# Patient Record
Sex: Female | Born: 1950
Health system: Southern US, Community
[De-identification: ages and names within clinical notes are randomized; demographics above are authoritative.]

## PROBLEM LIST (undated history)

## (undated) DIAGNOSIS — F32A Depression, unspecified: Secondary | ICD-10-CM

## (undated) DIAGNOSIS — E785 Hyperlipidemia, unspecified: Secondary | ICD-10-CM

## (undated) DIAGNOSIS — I428 Other cardiomyopathies: Secondary | ICD-10-CM

## (undated) DIAGNOSIS — I493 Ventricular premature depolarization: Secondary | ICD-10-CM

## (undated) DIAGNOSIS — K604 Rectal fistula, unspecified: Secondary | ICD-10-CM

## (undated) DIAGNOSIS — K219 Gastro-esophageal reflux disease without esophagitis: Secondary | ICD-10-CM

## (undated) DIAGNOSIS — Z972 Presence of dental prosthetic device (complete) (partial): Secondary | ICD-10-CM

## (undated) DIAGNOSIS — I38 Endocarditis, valve unspecified: Secondary | ICD-10-CM

## (undated) DIAGNOSIS — M1990 Unspecified osteoarthritis, unspecified site: Secondary | ICD-10-CM

## (undated) DIAGNOSIS — F410 Panic disorder [episodic paroxysmal anxiety] without agoraphobia: Secondary | ICD-10-CM

## (undated) DIAGNOSIS — F4310 Post-traumatic stress disorder, unspecified: Secondary | ICD-10-CM

## (undated) DIAGNOSIS — F329 Major depressive disorder, single episode, unspecified: Secondary | ICD-10-CM

## (undated) DIAGNOSIS — F419 Anxiety disorder, unspecified: Secondary | ICD-10-CM

## (undated) DIAGNOSIS — I2510 Atherosclerotic heart disease of native coronary artery without angina pectoris: Secondary | ICD-10-CM

## (undated) DIAGNOSIS — J449 Chronic obstructive pulmonary disease, unspecified: Secondary | ICD-10-CM

## (undated) DIAGNOSIS — G4730 Sleep apnea, unspecified: Secondary | ICD-10-CM

## (undated) DIAGNOSIS — E119 Type 2 diabetes mellitus without complications: Secondary | ICD-10-CM

## (undated) DIAGNOSIS — M810 Age-related osteoporosis without current pathological fracture: Secondary | ICD-10-CM

## (undated) HISTORY — DX: Post-traumatic stress disorder, unspecified: F43.10

## (undated) HISTORY — DX: Major depressive disorder, single episode, unspecified: F32.9

## (undated) HISTORY — DX: Type 2 diabetes mellitus without complications: E11.9

## (undated) HISTORY — PX: TOTAL ABDOMINAL HYSTERECTOMY W/ BILATERAL SALPINGOOPHORECTOMY: SHX83

## (undated) HISTORY — DX: Unspecified osteoarthritis, unspecified site: M19.90

## (undated) HISTORY — DX: Depression, unspecified: F32.A

## (undated) HISTORY — DX: Gastro-esophageal reflux disease without esophagitis: K21.9

## (undated) HISTORY — DX: Atherosclerotic heart disease of native coronary artery without angina pectoris: I25.10

## (undated) HISTORY — DX: Ventricular premature depolarization: I49.3

## (undated) HISTORY — DX: Hyperlipidemia, unspecified: E78.5

## (undated) HISTORY — PX: TUBAL LIGATION: SHX77

## (undated) HISTORY — DX: Anxiety disorder, unspecified: F41.9

## (undated) HISTORY — DX: Age-related osteoporosis without current pathological fracture: M81.0

## (undated) HISTORY — PX: ABDOMINAL HYSTERECTOMY: SHX81

## (undated) HISTORY — DX: Morbid (severe) obesity due to excess calories: E66.01

## (undated) HISTORY — DX: Other cardiomyopathies: I42.8

## (undated) HISTORY — DX: Chronic obstructive pulmonary disease, unspecified: J44.9

## (undated) HISTORY — PX: CARDIAC CATHETERIZATION: SHX172

## (undated) HISTORY — DX: Panic disorder (episodic paroxysmal anxiety): F41.0

---

## 2003-04-26 ENCOUNTER — Emergency Department (HOSPITAL_COMMUNITY): Admission: EM | Admit: 2003-04-26 | Discharge: 2003-04-26 | Payer: Self-pay | Admitting: Emergency Medicine

## 2004-12-04 ENCOUNTER — Emergency Department: Payer: Self-pay | Admitting: Unknown Physician Specialty

## 2005-11-16 ENCOUNTER — Ambulatory Visit: Payer: Self-pay | Admitting: Cardiovascular Disease

## 2005-11-22 ENCOUNTER — Ambulatory Visit: Payer: Self-pay | Admitting: Cardiovascular Disease

## 2005-12-20 ENCOUNTER — Ambulatory Visit: Payer: Self-pay | Admitting: Gastroenterology

## 2007-01-16 ENCOUNTER — Ambulatory Visit: Payer: Self-pay

## 2007-06-17 ENCOUNTER — Ambulatory Visit: Payer: Self-pay | Admitting: Gastroenterology

## 2007-07-24 ENCOUNTER — Ambulatory Visit: Payer: Self-pay | Admitting: Podiatry

## 2007-08-01 ENCOUNTER — Emergency Department: Payer: Self-pay | Admitting: Unknown Physician Specialty

## 2009-09-13 DIAGNOSIS — Z87891 Personal history of nicotine dependence: Secondary | ICD-10-CM | POA: Insufficient documentation

## 2011-12-19 ENCOUNTER — Emergency Department: Payer: Self-pay | Admitting: Emergency Medicine

## 2012-10-22 HISTORY — PX: COLONOSCOPY: SHX174

## 2013-01-28 ENCOUNTER — Emergency Department: Payer: Self-pay | Admitting: Emergency Medicine

## 2013-01-28 LAB — COMPREHENSIVE METABOLIC PANEL
Albumin: 3.4 g/dL (ref 3.4–5.0)
Alkaline Phosphatase: 112 U/L (ref 50–136)
Anion Gap: 15 (ref 7–16)
BUN: 21 mg/dL — ABNORMAL HIGH (ref 7–18)
Bilirubin,Total: 0.6 mg/dL (ref 0.2–1.0)
Calcium, Total: 8.3 mg/dL — ABNORMAL LOW (ref 8.5–10.1)
Chloride: 105 mmol/L (ref 98–107)
Co2: 17 mmol/L — ABNORMAL LOW (ref 21–32)
Creatinine: 1.66 mg/dL — ABNORMAL HIGH (ref 0.60–1.30)
EGFR (African American): 39 — ABNORMAL LOW
EGFR (Non-African Amer.): 33 — ABNORMAL LOW
Glucose: 206 mg/dL — ABNORMAL HIGH (ref 65–99)
Osmolality: 283 (ref 275–301)
Potassium: 4 mmol/L (ref 3.5–5.1)
SGOT(AST): 49 U/L — ABNORMAL HIGH (ref 15–37)
SGPT (ALT): 75 U/L (ref 12–78)
Sodium: 137 mmol/L (ref 136–145)
Total Protein: 7.2 g/dL (ref 6.4–8.2)

## 2013-01-28 LAB — URINALYSIS, COMPLETE
Bacteria: NONE SEEN
Bilirubin,UR: NEGATIVE
Glucose,UR: 50 mg/dL (ref 0–75)
Hyaline Cast: 12
Nitrite: NEGATIVE
Ph: 5 (ref 4.5–8.0)
Protein: 30
RBC,UR: 18 /HPF (ref 0–5)
Specific Gravity: 1.018 (ref 1.003–1.030)
Squamous Epithelial: 4
WBC UR: 13 /HPF (ref 0–5)

## 2013-01-28 LAB — CBC
HCT: 42.5 % (ref 35.0–47.0)
HGB: 14.1 g/dL (ref 12.0–16.0)
MCH: 33.3 pg (ref 26.0–34.0)
MCHC: 33.2 g/dL (ref 32.0–36.0)
MCV: 100 fL (ref 80–100)
Platelet: 278 10*3/uL (ref 150–440)
RBC: 4.24 10*6/uL (ref 3.80–5.20)
RDW: 13.3 % (ref 11.5–14.5)
WBC: 8.8 10*3/uL (ref 3.6–11.0)

## 2013-01-28 LAB — ETHANOL
Ethanol %: <0.003 % (ref 0.000–0.080)
Ethanol: <3 mg/dL

## 2013-01-28 LAB — ACETAMINOPHEN LEVEL: Acetaminophen: 5 ug/mL — ABNORMAL LOW

## 2013-01-28 LAB — DRUG SCREEN, URINE
Amphetamines, Ur Screen: NEGATIVE (ref ?–1000)
Barbiturates, Ur Screen: NEGATIVE (ref ?–200)
Benzodiazepine, Ur Scrn: NEGATIVE (ref ?–200)
Cannabinoid 50 Ng, Ur ~~LOC~~: NEGATIVE (ref ?–50)
Cocaine Metabolite,Ur ~~LOC~~: NEGATIVE (ref ?–300)
MDMA (Ecstasy)Ur Screen: NEGATIVE (ref ?–500)
Methadone, Ur Screen: NEGATIVE (ref ?–300)
Opiate, Ur Screen: POSITIVE (ref ?–300)
Phencyclidine (PCP) Ur S: NEGATIVE (ref ?–25)
Tricyclic, Ur Screen: NEGATIVE (ref ?–1000)

## 2013-01-28 LAB — TSH: Thyroid Stimulating Horm: 2.57 u[IU]/mL

## 2013-01-28 LAB — SALICYLATE LEVEL: Salicylates, Serum: 3.3 mg/dL — ABNORMAL HIGH

## 2013-02-24 ENCOUNTER — Observation Stay: Payer: Self-pay | Admitting: Internal Medicine

## 2013-02-24 LAB — COMPREHENSIVE METABOLIC PANEL
Albumin: 3.6 g/dL (ref 3.4–5.0)
Alkaline Phosphatase: 113 U/L (ref 50–136)
Anion Gap: 6 — ABNORMAL LOW (ref 7–16)
BUN: 11 mg/dL (ref 7–18)
Bilirubin,Total: 0.4 mg/dL (ref 0.2–1.0)
Calcium, Total: 8.6 mg/dL (ref 8.5–10.1)
Chloride: 111 mmol/L — ABNORMAL HIGH (ref 98–107)
Co2: 26 mmol/L (ref 21–32)
Creatinine: 0.69 mg/dL (ref 0.60–1.30)
EGFR (African American): >60
EGFR (Non-African Amer.): >60
Glucose: 87 mg/dL (ref 65–99)
Osmolality: 284 (ref 275–301)
Potassium: 4.1 mmol/L (ref 3.5–5.1)
SGOT(AST): 58 U/L — ABNORMAL HIGH (ref 15–37)
SGPT (ALT): 94 U/L — ABNORMAL HIGH (ref 12–78)
Sodium: 143 mmol/L (ref 136–145)
Total Protein: 7.2 g/dL (ref 6.4–8.2)

## 2013-02-24 LAB — CBC WITH DIFFERENTIAL/PLATELET
Basophil #: 0 10*3/uL (ref 0.0–0.1)
Basophil %: 0.6 %
Eosinophil #: 0.3 10*3/uL (ref 0.0–0.7)
Eosinophil %: 3.5 %
HCT: 41 % (ref 35.0–47.0)
HGB: 14 g/dL (ref 12.0–16.0)
Lymphocyte #: 3.1 10*3/uL (ref 1.0–3.6)
Lymphocyte %: 38.2 %
MCH: 32.3 pg (ref 26.0–34.0)
MCHC: 34.1 g/dL (ref 32.0–36.0)
MCV: 95 fL (ref 80–100)
Monocyte #: 0.6 x10 3/mm (ref 0.2–0.9)
Monocyte %: 7.7 %
Neutrophil #: 4 10*3/uL (ref 1.4–6.5)
Neutrophil %: 50 %
Platelet: 244 10*3/uL (ref 150–440)
RBC: 4.32 10*6/uL (ref 3.80–5.20)
RDW: 13 % (ref 11.5–14.5)
WBC: 8 10*3/uL (ref 3.6–11.0)

## 2013-02-24 LAB — TROPONIN I
Troponin-I: <0.02 ng/mL
Troponin-I: <0.02 ng/mL

## 2013-02-24 LAB — PRO B NATRIURETIC PEPTIDE: B-Type Natriuretic Peptide: 68 pg/mL (ref 0–125)

## 2013-02-24 LAB — TSH: Thyroid Stimulating Horm: 0.749 u[IU]/mL

## 2013-02-25 LAB — TROPONIN I: Troponin-I: <0.02 ng/mL

## 2014-05-21 ENCOUNTER — Ambulatory Visit: Payer: Self-pay | Admitting: Family Medicine

## 2014-06-18 ENCOUNTER — Ambulatory Visit: Payer: Self-pay | Admitting: Gastroenterology

## 2014-11-25 DIAGNOSIS — F419 Anxiety disorder, unspecified: Secondary | ICD-10-CM | POA: Diagnosis not present

## 2014-11-25 DIAGNOSIS — F339 Major depressive disorder, recurrent, unspecified: Secondary | ICD-10-CM | POA: Diagnosis not present

## 2014-11-25 DIAGNOSIS — E784 Other hyperlipidemia: Secondary | ICD-10-CM | POA: Diagnosis not present

## 2014-12-20 DIAGNOSIS — E785 Hyperlipidemia, unspecified: Secondary | ICD-10-CM | POA: Diagnosis not present

## 2014-12-20 DIAGNOSIS — K219 Gastro-esophageal reflux disease without esophagitis: Secondary | ICD-10-CM | POA: Diagnosis not present

## 2014-12-20 DIAGNOSIS — I10 Essential (primary) hypertension: Secondary | ICD-10-CM | POA: Diagnosis not present

## 2014-12-20 DIAGNOSIS — R079 Chest pain, unspecified: Secondary | ICD-10-CM | POA: Diagnosis not present

## 2014-12-23 DIAGNOSIS — R079 Chest pain, unspecified: Secondary | ICD-10-CM | POA: Diagnosis not present

## 2014-12-24 DIAGNOSIS — R079 Chest pain, unspecified: Secondary | ICD-10-CM | POA: Diagnosis not present

## 2014-12-24 DIAGNOSIS — F339 Major depressive disorder, recurrent, unspecified: Secondary | ICD-10-CM | POA: Diagnosis not present

## 2014-12-24 DIAGNOSIS — K219 Gastro-esophageal reflux disease without esophagitis: Secondary | ICD-10-CM | POA: Diagnosis not present

## 2014-12-24 DIAGNOSIS — E784 Other hyperlipidemia: Secondary | ICD-10-CM | POA: Diagnosis not present

## 2014-12-24 DIAGNOSIS — F419 Anxiety disorder, unspecified: Secondary | ICD-10-CM | POA: Diagnosis not present

## 2014-12-24 DIAGNOSIS — G4733 Obstructive sleep apnea (adult) (pediatric): Secondary | ICD-10-CM | POA: Diagnosis not present

## 2014-12-24 DIAGNOSIS — E785 Hyperlipidemia, unspecified: Secondary | ICD-10-CM | POA: Diagnosis not present

## 2015-02-04 DIAGNOSIS — E785 Hyperlipidemia, unspecified: Secondary | ICD-10-CM | POA: Diagnosis not present

## 2015-02-04 DIAGNOSIS — K219 Gastro-esophageal reflux disease without esophagitis: Secondary | ICD-10-CM | POA: Diagnosis not present

## 2015-02-04 DIAGNOSIS — F17213 Nicotine dependence, cigarettes, with withdrawal: Secondary | ICD-10-CM | POA: Diagnosis not present

## 2015-02-04 DIAGNOSIS — I340 Nonrheumatic mitral (valve) insufficiency: Secondary | ICD-10-CM | POA: Diagnosis not present

## 2015-02-11 NOTE — Discharge Summary (Signed)
PATIENT NAME:  Kim Buchanan, Kim Buchanan MR#:  [id number] DATE OF BIRTH:  October 13, 1951  DATE OF ADMISSION:  02/24/2013 DATE OF DISCHARGE:  02/25/2013  PRIMARY CARE PHYSICIAN:  Keith Rake, MD  CHIEF COMPLAINT: Chest pain.   DISCHARGE DIAGNOSES: 1.  Chest pain; appears noncardiac.  2.  History of anxiety/panic attacks.  3.  Tobacco abuse.   CONDITION ON DISCHARGE: Fair.   MEDICATIONS: 1.  Clonazepam 2 mg daily.  2.  Prozac 40 mg daily.  3.  Buspirone 15 mg daily, 4.  Aspirin 81 mg daily.   FOLLOWUP: With primary care physician, Dr. Keith Rake in 1 to 2 weeks.   Myoview stress test essentially negative for ischemia. No wall motion abnormality. EF 60%.   Cardiac enzymes x 3 negative. CBC and basic metabolic panel within normal limits except SGOT, SGPT of 24 and 58. TSH is 0.749.   BRIEF SUMMARY OF HOSPITAL COURSE: [name] is a 64 year old Caucasian female who has a history of mitral valve prolapse and anxiety, presents to the hospital with chest pain and slightly elevated blood pressure. She was admitted with:   1.  Chest pain, which has resolved. Cardiac enzymes remain negative. EKG and telly monitor: No new changes. The patient underwent a stress test and the results were essentially negative.  2.  Anxiety, panic attacks:  Continued her Prozac, BuSpar and Klonopin.  3.  Tobacco abuse: Smoking cessation was counseled.   Hospital stay was uncomplicated.   Time spent: Forty minutes.   The patient remained a FULL CODE.   ____________________________ Hart Rochester. Posey Pronto, MD sap:dm D: 02/26/2013 07:20:20 ET T: 02/26/2013 07:44:35 ET JOB#: [id number]  cc: Lasean Rahming A. Posey Pronto, MD, <Dictator> [facility] Myrla Halsted, MD Ilda Basset MD ELECTRONICALLY SIGNED 03/10/2013 15:23

## 2015-02-11 NOTE — H&P (Signed)
PATIENT NAME:  Kim Buchanan, AVENI MR#:  [id number] DATE OF BIRTH:  [birth year]  DATE OF ADMISSION:  02/24/2013  PRIMARY CARE PHYSICIAN: Keith Rake, MD  CHIEF COMPLAINT: Chest pain.   HISTORY OF PRESENT ILLNESS: This is a 64 year old female who presents to the hospital with an episode of chest pain that she had Saturday. The patient describes a feeling as heaviness in the center of her chest associated with nausea, 1 episode of vomiting with some diaphoresis, and dizziness. The episode lasted about 20 minutes or so and resolved by itself. The patient has had no further episodes since then. She went to see her primary care physician today as a followup. Her blood pressure was noted to be slightly elevated with systolic blood pressures in the 563O, diastolics over 756E. She also had some nonspecific ST changes on the EKG at her primary care physician's office. He sent her over to the ER for further evaluation. The patient presently is chest pain-free. She denies any shortness of breath, nausea, vomiting, fevers, chills, cough or any other associated symptoms presently. Hospitalist service was contacted for further treatment and evaluation.   REVIEW OF SYSTEMS: CONSTITUTIONAL: No documented fever. No weight gain or weight loss.  EYES: No blurred or double vision.  ENT: No tinnitus, no postnasal drip, no redness of the oropharynx.  RESPIRATORY: No cough, no wheeze, no hemoptysis, no dyspnea.  CARDIOVASCULAR: No chest pain, no orthopnea, no palpitations, no syncope.  GASTROINTESTINAL: No nausea, no vomiting, no diarrhea, no abdominal pain, no melena, no hematochezia.  GENITOURINARY: No dysuria, no hematuria.  ENDOCRINE: No polyuria or nocturia. No heat or cold intolerance.  HEMATOLOGIC: No anemia, no bruising, no bleeding.  INTEGUMENTARY: No rashes or lesions.  MUSCULOSKELETAL: No arthritis, no swelling, no gout.  NEUROLOGIC: No numbness or tingling. No ataxia. No seizure-type activity.  PSYCHIATRIC:  Positive anxiety and panic attacks. No insomnia. No ADD.   PAST MEDICAL HISTORY:  Is consistent with mitral valve prolapse, history of anxiety and panic attacks.   ALLERGIES: No known drug allergies.   SOCIAL HISTORY: Does smoke about 1/2 pack per day, has been smoking for the past 40 plus years.  Occasional alcohol use. No illicit drug abuse. Lives at home with her husband.   FAMILY HISTORY: Mother died from complications of diabetes. Father died from myocardial infarction.  Diabetes runs in the family. The patient has a brother who died from [place].  CURRENT MEDICATIONS:  1.  Buspirone 15 mg daily. 2.  Klonopin 2 mg daily. 3.  Prozac 40 mg daily.   PHYSICAL EXAMINATION ON ADMISSION: VITAL SIGNS:  Temperature 99, pulse 86, respirations 18, blood pressure 122/63 and sats 93% on room air. GENERAL: She is a pleasant appearing female in no apparent distress.  HEENT: Atraumatic, normocephalic. Extraocular muscles are intact. Pupils equal and reactive to light. Sclerae anicteric. No conjunctival injection. No pharyngeal erythema.  NECK: Supple. There is no jugular venous distention, no bruits, no lymphadenopathy and no thyromegaly.  HEART: Regular rate and rhythm. No murmurs or rubs. No clicks.  LUNGS: Clear to auscultation bilaterally. No rales or rhonchi. No wheezes.  ABDOMEN: Soft, flat, nontender and nondistended. Has good bowel sounds. No hepatosplenomegaly appreciated.  EXTREMITIES: No evidence of any cyanosis, clubbing or peripheral edema. Has +2 pedal and radial pulses bilaterally.  NEUROLOGICAL: The patient is alert, awake and oriented x 3 with no focal motor or sensory deficits appreciated bilaterally.  SKIN: Moist and warm with no rash appreciated.  LYMPHATIC: There is no cervical  or axillary lymphadenopathy.   LABORATORY AND DIAGNOSTICS: Serum glucose 87, BUN 11, creatinine 0.6, sodium 143, potassium 4.1, chloride 111, bicarbonate 26. The patient's LFTs showed a mildly elevated  AST of 58 and ALT of 94. Troponin less than 0.02. White cell count 8, hemoglobin 14.0, hematocrit 41.0 and platelet count 244.   The patient did have a chest x-ray done which showed no evidence of any acute cardiopulmonary disease. The patient had an EKG done which showed normal sinus rhythm with a left axis deviation, but no other acute ST or T wave changes.  ASSESSMENT AND PLAN: This is a 64 year old female with past medical history of mitral valve prolapse and anxiety who presents to the hospital with chest pain and slightly elevated blood pressure.  1.  Chest pain. The patient's symptoms are somewhat typical of angina and she does have risk factors given her family history and also long history of tobacco abuse. Therefore, I will observe her overnight on telemetry, follow serial cardiac markers, continue aspirin, nitroglycerin, oxygen and morphine for now.  We will get a stress test in the morning and also get a cardiology consult with Dr. Neoma Laming.  2.  Anxiety/panic attacks. I will continue her Prozac, BuSpar and Klonopin for now.  3.  Tobacco abuse. Place her on a nicotine patch. I counseled the patient on quitting smoking, anywhere between 1 to 3 minutes.    CODE STATUS: FULL CODE.   TIME SPENT ON ADMISSION:  45 minutes.  ____________________________ Belia Heman. Verdell Carmine, MD vjs:sb D: 02/24/2013 14:54:28 ET T: 02/24/2013 15:23:19 ET JOB#: [id number]  cc: Belia Heman. Verdell Carmine, MD, <Dictator> Henreitta Leber MD ELECTRONICALLY SIGNED 02/25/2013 15:27

## 2015-03-02 ENCOUNTER — Other Ambulatory Visit: Payer: Self-pay | Admitting: Unknown Physician Specialty

## 2015-03-02 DIAGNOSIS — M25561 Pain in right knee: Secondary | ICD-10-CM | POA: Diagnosis not present

## 2015-03-02 DIAGNOSIS — G8929 Other chronic pain: Secondary | ICD-10-CM | POA: Diagnosis not present

## 2015-03-03 ENCOUNTER — Ambulatory Visit
Admission: RE | Admit: 2015-03-03 | Discharge: 2015-03-03 | Disposition: A | Discharge status: Home / Self Care | Payer: Medicare Other | Source: Ambulatory Visit | Attending: Unknown Physician Specialty | Admitting: Unknown Physician Specialty

## 2015-03-03 DIAGNOSIS — M25561 Pain in right knee: Secondary | ICD-10-CM | POA: Insufficient documentation

## 2015-03-03 DIAGNOSIS — G8929 Other chronic pain: Secondary | ICD-10-CM

## 2015-03-09 DIAGNOSIS — M25561 Pain in right knee: Secondary | ICD-10-CM | POA: Diagnosis not present

## 2015-03-09 DIAGNOSIS — E785 Hyperlipidemia, unspecified: Secondary | ICD-10-CM | POA: Diagnosis not present

## 2015-05-11 ENCOUNTER — Other Ambulatory Visit: Payer: Self-pay | Admitting: Family Medicine

## 2015-05-11 DIAGNOSIS — F419 Anxiety disorder, unspecified: Secondary | ICD-10-CM

## 2015-05-11 MED ORDER — CLONAZEPAM 1 MG PO TABS: 1.0000 mg | ORAL_TABLET | Freq: Three times a day (TID) | ORAL | Status: DC | PRN

## 2015-05-11 NOTE — Telephone Encounter (Signed)
Medication has been ordered and waiting for Dr. Manuella Ghazi approval.

## 2015-05-11 NOTE — Telephone Encounter (Signed)
Patient is completely out of ClonazePam. Please call Phineas Douglas (husband) once the prescription is ready [phone]

## 2015-06-06 ENCOUNTER — Encounter: Payer: Self-pay | Admitting: Family Medicine

## 2015-06-06 ENCOUNTER — Ambulatory Visit (INDEPENDENT_AMBULATORY_CARE_PROVIDER_SITE_OTHER): Payer: Medicare Other | Admitting: Family Medicine

## 2015-06-06 VITALS — BP 132/73 | HR 120 | Temp 98.8°F | Resp 18 | Ht 63.0 in | Wt 145.7 lb

## 2015-06-06 DIAGNOSIS — F419 Anxiety disorder, unspecified: Secondary | ICD-10-CM

## 2015-06-06 DIAGNOSIS — F411 Generalized anxiety disorder: Secondary | ICD-10-CM | POA: Insufficient documentation

## 2015-06-06 DIAGNOSIS — F329 Major depressive disorder, single episode, unspecified: Secondary | ICD-10-CM | POA: Insufficient documentation

## 2015-06-06 DIAGNOSIS — K644 Residual hemorrhoidal skin tags: Secondary | ICD-10-CM | POA: Insufficient documentation

## 2015-06-06 DIAGNOSIS — K648 Other hemorrhoids: Secondary | ICD-10-CM | POA: Diagnosis not present

## 2015-06-06 DIAGNOSIS — F418 Other specified anxiety disorders: Secondary | ICD-10-CM | POA: Insufficient documentation

## 2015-06-06 DIAGNOSIS — F410 Panic disorder [episodic paroxysmal anxiety] without agoraphobia: Secondary | ICD-10-CM | POA: Insufficient documentation

## 2015-06-06 DIAGNOSIS — K219 Gastro-esophageal reflux disease without esophagitis: Secondary | ICD-10-CM | POA: Insufficient documentation

## 2015-06-06 DIAGNOSIS — F17200 Nicotine dependence, unspecified, uncomplicated: Secondary | ICD-10-CM | POA: Insufficient documentation

## 2015-06-06 MED ORDER — OXYCODONE-ACETAMINOPHEN 5-325 MG PO TABS: 1.0000 | ORAL_TABLET | Freq: Three times a day (TID) | ORAL | Status: DC | PRN

## 2015-06-06 MED ORDER — POLYETHYLENE GLYCOL 3350 17 GM/SCOOP PO POWD: 17.0000 g | Freq: Every day | ORAL | Status: DC

## 2015-06-06 NOTE — Progress Notes (Signed)
Name: Kim Buchanan   MRN: [id number]    DOB: [birth year]   Date:06/06/2015       Progress Note  Subjective  Chief Complaint  Chief Complaint  Patient presents with  . Acute Visit    Hemmorhoids    HPI Hemorrhoids Pt. Is here for evaluation of 'hemorrhoids.' She reports a mass-like structure hanging and dropping from her rectum, especially when she strains and having bright red blood in the bowl and on the toilet paper after having a BM. She is in a lot of pain, and using Preparation H, which has not helped.  Past Medical History  Diagnosis Date  . Depression   . Anxiety   . GERD (gastroesophageal reflux disease)   . Hyperlipidemia     History reviewed. No pertinent past surgical history.  Family History  Problem Relation Age of Onset  . Diabetes Mother   . Diabetes Sister   . Diabetes Brother   . Lymphoma Brother     Social History   Social History  . Marital Status: Married    Spouse Name: N/A  . Number of Children: N/A  . Years of Education: N/A   Occupational History  . Not on file.   Social History Main Topics  . Smoking status: Current Every Day Smoker    Types: Cigarettes  . Smokeless tobacco: Never Used  . Alcohol Use: No  . Drug Use: No  . Sexual Activity: Not on file   Other Topics Concern  . Not on file   Social History Narrative  . No narrative on file     Current outpatient prescriptions:  .  aspirin 81 MG tablet, Take by mouth., Disp: , Rfl:  .  clonazePAM (KLONOPIN) 1 MG tablet, Take 1 tablet (1 mg total) by mouth 3 (three) times daily as needed for anxiety., Disp: 90 tablet, Rfl: 0 .  omeprazole (PRILOSEC) 40 MG capsule, Take 1 capsule by mouth 2 (two) times daily., Disp: , Rfl:  .  Sennosides (LAXATIVE PILLS PO), Take 8.6 mg by mouth as needed., Disp: , Rfl:  .  simvastatin (ZOCOR) 5 MG tablet, Take 1 tablet by mouth daily., Disp: , Rfl:  .  Venlafaxine HCl 150 MG TB24, Take 1 capsule by mouth daily., Disp: , Rfl:  .  venlafaxine  XR (EFFEXOR-XR) 75 MG 24 hr capsule, Take 1 capsule by mouth daily., Disp: , Rfl:   No Known Allergies   Review of Systems  Constitutional: Negative for fever and chills.  Gastrointestinal: Positive for abdominal pain and constipation. Negative for diarrhea and blood in stool.      Objective  Filed Vitals:   06/06/15 1333  BP: 132/73  Pulse: 120  Temp: 98.8 F (37.1 C)  TempSrc: Oral  Resp: 18  Height: 5\' 3"  (1.6 m)  Weight: 145 lb 11.2 oz (66.089 kg)  SpO2: 97%    Physical Exam  Constitutional: She is well-developed, well-nourished, and in no distress.  Genitourinary: Rectal exam shows external hemorrhoid and tenderness.  Grade 3-4 external hemorrhoids, tender to  palpation, no bleeding visualized.  Nursing note and vitals reviewed.   Assessment & Plan  1. Prolapsed external hemorrhoids Urgent referral to gastroenterology Dr. Alan Ripper. Surgery Dr. Pat Patrick for further evaluation and management. Patient advised to increase fiber and fluid intake for relief of constipation. She may take MiraLAX as needed for relief of constipation. Prescription for Percocet provided for relief of severe pain. - Ambulatory referral to Gastroenterology - oxyCODONE-acetaminophen (ROXICET) 5-325 MG per  tablet; Take 1 tablet by mouth every 8 (eight) hours as needed for severe pain.  Dispense: 21 tablet; Refill: 0 - polyethylene glycol powder (GLYCOLAX/MIRALAX) powder; Take 17 g by mouth daily. 1 capful po qd dissolved in 8 oz of liquid PRN. Max: 1 capful/day  Dispense: 500 g; Refill: 1   Kim Buchanan Asad [facility] 06/06/2015 1:53 PM

## 2015-06-07 ENCOUNTER — Telehealth: Payer: Self-pay | Admitting: Gastroenterology

## 2015-06-07 NOTE — Telephone Encounter (Signed)
I have called pt to make an appointment per referral for external prolapsed hemorrhoids. No answer. I was not able to leave a message due to voicemail not being set up at this time.

## 2015-06-09 ENCOUNTER — Encounter: Payer: Self-pay | Admitting: Surgery

## 2015-06-09 ENCOUNTER — Ambulatory Visit (INDEPENDENT_AMBULATORY_CARE_PROVIDER_SITE_OTHER): Payer: Medicare Other | Admitting: Surgery

## 2015-06-09 VITALS — BP 128/63 | HR 90 | Temp 98.3°F | Ht 63.0 in | Wt 145.0 lb

## 2015-06-09 DIAGNOSIS — K623 Rectal prolapse: Secondary | ICD-10-CM

## 2015-06-09 DIAGNOSIS — K648 Other hemorrhoids: Secondary | ICD-10-CM | POA: Diagnosis not present

## 2015-06-09 DIAGNOSIS — K644 Residual hemorrhoidal skin tags: Secondary | ICD-10-CM

## 2015-06-09 MED ORDER — OXYCODONE-ACETAMINOPHEN 5-325 MG PO TABS: 1.0000 | ORAL_TABLET | Freq: Three times a day (TID) | ORAL | Status: DC | PRN

## 2015-06-09 NOTE — Progress Notes (Signed)
CC: Anal pain, prolapsing anus  HPI: Kim Buchanan is a pleasant 64 yo with a history of hemorrhoids who presents with 1 week of "excruciating" anal pain.  Began acutely.  Has noticed that she has had multiple episodes in the past week where her "hemorrhoids" have stuck out a number of inches.  I have shown them pictures of rectal prolapse and they said that it is what it looks like.  Some bleeding with BM and feels like she is straining.  Has been on percocet since visit with Dr. Brigitte Pulse.  Always is able to push it back in.  No fevers/chills, night sweats, shortness of breath, cough, chest pain, abdominal pain, nausea/vomiting, diarrhea, dysuria/hematuria.  Active Ambulatory Problems    Diagnosis Date Noted  . Prolapsed external hemorrhoids 06/06/2015  . Anxiety and depression 06/06/2015  . Depression, major, recurrent 06/06/2015  . Dyslipidemia 06/06/2015  . Acid reflux 06/06/2015  . Panic attack 06/06/2015  . Compulsive tobacco user syndrome 06/06/2015   Resolved Ambulatory Problems    Diagnosis Date Noted  . No Resolved Ambulatory Problems   Past Medical History  Diagnosis Date  . Depression   . Anxiety   . GERD (gastroesophageal reflux disease)   . Hyperlipidemia    History reviewed. No pertinent past surgical history.     Medication List       This list is accurate as of: 06/09/15  1:38 PM.  Always use your most recent med list.               aspirin 81 MG tablet  Take 81 mg by mouth daily.     clonazePAM 1 MG tablet  Commonly known as:  KLONOPIN  Take 1 tablet (1 mg total) by mouth 3 (three) times daily as needed for anxiety.     LAXATIVE PILLS PO  Take 8.6 mg by mouth as needed.     omeprazole 40 MG capsule  Commonly known as:  PRILOSEC  Take 1 capsule by mouth 2 (two) times daily.     oxyCODONE-acetaminophen 5-325 MG per tablet  Commonly known as:  ROXICET  Take 1 tablet by mouth every 8 (eight) hours as needed for severe pain.     simvastatin 5 MG tablet   Commonly known as:  ZOCOR  Take 1 tablet by mouth daily.     Venlafaxine HCl 150 MG Tb24  Take 225 mg by mouth daily.     venlafaxine XR 75 MG 24 hr capsule  Commonly known as:  EFFEXOR-XR  Take 1 capsule by mouth daily.       No Known Allergies   Social History   Social History  . Marital Status: Married    Spouse Name: N/A  . Number of Children: N/A  . Years of Education: N/A   Occupational History  . Not on file.   Social History Main Topics  . Smoking status: Current Every Day Smoker -- 0.50 packs/day    Types: Cigarettes  . Smokeless tobacco: Never Used  . Alcohol Use: No  . Drug Use: No  . Sexual Activity: Not on file   Other Topics Concern  . Not on file   Social History Narrative   Family History  Problem Relation Age of Onset  . Diabetes Mother   . Diabetes Sister   . Diabetes Brother   . Lymphoma Brother    ROS: Full ROS obtained, pertinent positives and negatives as above.  Blood pressure 128/63, pulse 90, temperature 98.3 F (36.8 C),  temperature source Oral, height 5\' 3"  (1.6 m), weight 145 lb (65.772 kg). GEN: NAD/A&Ox3 FACE: no obvious facial trauma, normal external nose, normal external ears EYES: no scleral icterus, no conjunctivitis HEAD: normocephalic atraumatic CV: RRR, no MRG RESP: moving air well, lungs clear ABD: soft, nontender, nondistended ANUS: Approx 2 cm of circumferentially prolapsed anal mucosa, easily reducible.  Nontender to digital exam, no palpable masses.  No ulcerations, no necrosis EXT: moving all ext well, strength 5/5  A/P 64 yo with symptomatic rectal prolapse.  Have recommended stopping fiber, beginning miralax and any other necessary stool softeners/laxatives to keep her stools soft while on pain medications.  Will refer to colorectal surgeon for definitive treatment of prolapse. Have told her to return to ER if she develops severe pain or is unable to push it back in or if it becomes discolored on exam.  F/u  prn. NEURO: cnII-XII grossly intact, sensation intact all 4 ext

## 2015-06-09 NOTE — Patient Instructions (Signed)
You will need to see a Colo-rectal surgeon for your "RECTAL PROLAPSE." We will try [facility] and [facility] per your request and will get you the quickest appointment that we can.  Our office will be calling you with the details of this appointment.  If your rectum comes out and you cannot get this to go back in or your bleeding becomes SEVERE (you are having to change a pad every hour or more) you need to go to the Emergency Room immediately.  We will treat your pain until you are seen by the Colo-rectal surgeon. Please call your pharmacy for a refill in advance of running out of your medicine.  Discontinue the use of your fiber as this may constipate you, please start and continue Miralax until you are seen by the surgeon at the other medical facility. Decrease straining with bowel movements and urination as much as possible.  Please call our office and ask to speak with a nurse for any questions or concerns.

## 2015-06-10 ENCOUNTER — Ambulatory Visit: Payer: Self-pay | Admitting: Family Medicine

## 2015-06-13 ENCOUNTER — Telehealth: Payer: Self-pay | Admitting: Surgery

## 2015-06-13 NOTE — Telephone Encounter (Signed)
Please call patients husband, Kim Buchanan. Patient was seen by Dr Rexene Edison last week and he is referring patient out for surgery. They are checking on the status of the referral.

## 2015-06-13 NOTE — Telephone Encounter (Signed)
Patient has called and stated that a referral was being done for Colo-rectal sx. After reading the notes, it looks as if this is being taken care of. A note was not sent to me to do referral. Please update me on the status of this when time permits.

## 2015-06-13 NOTE — Telephone Encounter (Signed)
We discussed this last week.   Patient needs to go to [facility] or [facility], whomever can get her in sooner.  Referral needs to be for Rectal Prolapse.

## 2015-06-14 NOTE — Telephone Encounter (Signed)
Pt has been advised of referral to [facility].  Appointment date: 06/28/15 @ 2:00pm with Dr Sharol Roussel. [facility]  No referral is required through [facility].   All notes/labs/imaging has been faxed to [phone].

## 2015-06-20 ENCOUNTER — Telehealth: Payer: Self-pay | Admitting: Surgery

## 2015-06-20 DIAGNOSIS — K644 Residual hemorrhoidal skin tags: Secondary | ICD-10-CM

## 2015-06-20 MED ORDER — OXYCODONE-ACETAMINOPHEN 5-325 MG PO TABS: 1.0000 | ORAL_TABLET | Freq: Three times a day (TID) | ORAL | Status: DC | PRN

## 2015-06-20 NOTE — Telephone Encounter (Signed)
Spoke with Dr. Rexene Edison, who wrote new script for enough medication to get patient to her Colorectal Surgeon.  Explained to patient that once she is seen by Colorectal surgeon on 9/6 that she would then follow-up with their office for any needed medications or treatments.  She verbalized understanding.  Her script is in the [place] office at this time. Pt states that she will have someone pick this up, this afternoon.

## 2015-06-20 NOTE — Telephone Encounter (Signed)
Patient is asking for a refill on her pain medication Oxcycodone

## 2015-06-20 NOTE — Telephone Encounter (Signed)
Pt calls back and states that appointment to see Colorectal Surgeon is on 06/28/15 at 2pm at [facility].   Explained that we would have to speak with Dr. Pat Patrick in regards to refilling her pain medication and then I would call her back and let her know what is going on with this.

## 2015-06-20 NOTE — Telephone Encounter (Signed)
Returned phone call to patient at this time. No answer. Left voicemail stating that I needed to get her appointment information for the Colorectal surgeon prior to speaking with Dr. Pat Patrick today. Will await return phone call.

## 2015-06-28 ENCOUNTER — Other Ambulatory Visit: Payer: Self-pay | Admitting: Family Medicine

## 2015-06-28 DIAGNOSIS — F419 Anxiety disorder, unspecified: Secondary | ICD-10-CM

## 2015-06-28 HISTORY — PX: RECTAL SURGERY: SHX760

## 2015-06-28 NOTE — Telephone Encounter (Signed)
Requesting refill on Clozapam. Have been out for 2days. Please send to [facility]

## 2015-06-29 NOTE — Telephone Encounter (Signed)
Routed to Dr. Shah for approval 

## 2015-06-30 MED ORDER — CLONAZEPAM 1 MG PO TABS: 1.0000 mg | ORAL_TABLET | Freq: Three times a day (TID) | ORAL | Status: DC | PRN

## 2015-06-30 NOTE — Telephone Encounter (Signed)
Called patient to inform her prescription is ready for pickup, please bring photo Id

## 2015-07-05 ENCOUNTER — Telehealth: Payer: Self-pay | Admitting: Family Medicine

## 2015-07-05 MED ORDER — VENLAFAXINE HCL ER 75 MG PO CP24: 75.0000 mg | ORAL_CAPSULE | Freq: Every day | ORAL | Status: DC

## 2015-07-05 MED ORDER — VENLAFAXINE HCL ER 150 MG PO TB24: 225.0000 mg | ORAL_TABLET | Freq: Every day | ORAL | Status: DC

## 2015-07-05 NOTE — Telephone Encounter (Signed)
Medication has been refilled and sent to [facility]

## 2015-07-06 ENCOUNTER — Ambulatory Visit: Payer: Self-pay | Admitting: Family Medicine

## 2015-07-13 DIAGNOSIS — K623 Rectal prolapse: Secondary | ICD-10-CM | POA: Insufficient documentation

## 2015-07-26 DIAGNOSIS — K623 Rectal prolapse: Secondary | ICD-10-CM | POA: Diagnosis not present

## 2015-08-02 ENCOUNTER — Ambulatory Visit: Payer: Self-pay | Admitting: Family Medicine

## 2015-08-05 ENCOUNTER — Encounter: Payer: Self-pay | Admitting: Family Medicine

## 2015-08-05 ENCOUNTER — Ambulatory Visit (INDEPENDENT_AMBULATORY_CARE_PROVIDER_SITE_OTHER): Payer: Medicare Other | Admitting: Family Medicine

## 2015-08-05 VITALS — BP 130/68 | HR 108 | Temp 98.8°F | Resp 20 | Ht 63.0 in | Wt 145.6 lb

## 2015-08-05 DIAGNOSIS — E785 Hyperlipidemia, unspecified: Secondary | ICD-10-CM | POA: Diagnosis not present

## 2015-08-05 DIAGNOSIS — K219 Gastro-esophageal reflux disease without esophagitis: Secondary | ICD-10-CM | POA: Diagnosis not present

## 2015-08-05 DIAGNOSIS — Z23 Encounter for immunization: Secondary | ICD-10-CM

## 2015-08-05 DIAGNOSIS — F418 Other specified anxiety disorders: Secondary | ICD-10-CM

## 2015-08-05 DIAGNOSIS — F419 Anxiety disorder, unspecified: Secondary | ICD-10-CM

## 2015-08-05 DIAGNOSIS — F329 Major depressive disorder, single episode, unspecified: Secondary | ICD-10-CM

## 2015-08-05 MED ORDER — SIMVASTATIN 5 MG PO TABS: 5.0000 mg | ORAL_TABLET | Freq: Every day | ORAL | Status: DC

## 2015-08-05 MED ORDER — CLONAZEPAM 1 MG PO TABS: 1.0000 mg | ORAL_TABLET | Freq: Three times a day (TID) | ORAL | Status: DC | PRN

## 2015-08-05 MED ORDER — VENLAFAXINE HCL ER 75 MG PO CP24: 75.0000 mg | ORAL_CAPSULE | Freq: Every day | ORAL | Status: DC

## 2015-08-05 MED ORDER — OMEPRAZOLE 40 MG PO CPDR: 40.0000 mg | DELAYED_RELEASE_CAPSULE | Freq: Every day | ORAL | Status: DC

## 2015-08-05 MED ORDER — VENLAFAXINE HCL ER 150 MG PO CP24: 150.0000 mg | ORAL_CAPSULE | Freq: Every day | ORAL | Status: DC

## 2015-08-05 NOTE — Progress Notes (Signed)
Name: Kim Buchanan   MRN: [id number]    DOB: 1951/10/14   Date:08/05/2015       Progress Note  Subjective  Chief Complaint  Chief Complaint  Patient presents with  . Follow-up    1 mo  . Hyperlipidemia  . Anxiety  . Medication Refill   Hyperlipidemia This is a chronic problem. The problem is controlled. Pertinent negatives include no chest pain, leg pain, myalgias or shortness of breath. Current antihyperlipidemic treatment includes statins.  Anxiety Presents for follow-up visit. Symptoms include excessive worry, insomnia, irritability and nervous/anxious behavior. Patient reports no chest pain, nausea, restlessness or shortness of breath.   Past treatments include benzodiazephines.  Depression        This is a chronic problem.  The onset quality is gradual.   Associated symptoms include fatigue and insomnia.  Associated symptoms include no helplessness, no hopelessness, no restlessness, no decreased interest, no myalgias and not sad.  Past treatments include SNRIs - Serotonin and norepinephrine reuptake inhibitors.  Past medical history includes anxiety.   Gastrophageal Reflux She complains of heartburn. She reports no abdominal pain, no chest pain, no dysphagia or no nausea. The symptoms are aggravated by lying down and stress. Associated symptoms include fatigue. She has tried a PPI for the symptoms.   Past Medical History  Diagnosis Date  . Depression   . Anxiety   . GERD (gastroesophageal reflux disease)   . Hyperlipidemia     History reviewed. No pertinent past surgical history.  Family History  Problem Relation Age of Onset  . Diabetes Mother   . Diabetes Sister   . Diabetes Brother   . Lymphoma Brother     Social History   Social History  . Marital Status: Married    Spouse Name: N/A  . Number of Children: N/A  . Years of Education: N/A   Occupational History  . Not on file.   Social History Main Topics  . Smoking status: Current Every Day Smoker --  0.50 packs/day    Types: Cigarettes  . Smokeless tobacco: Never Used  . Alcohol Use: No  . Drug Use: No  . Sexual Activity: Not on file   Other Topics Concern  . Not on file   Social History Narrative    Current outpatient prescriptions:  .  aspirin 81 MG tablet, Take 81 mg by mouth daily. , Disp: , Rfl:  .  clonazePAM (KLONOPIN) 1 MG tablet, Take 1 tablet (1 mg total) by mouth 3 (three) times daily as needed for anxiety., Disp: 90 tablet, Rfl: 0 .  omeprazole (PRILOSEC) 40 MG capsule, Take 1 capsule by mouth 2 (two) times daily., Disp: , Rfl:  .  oxyCODONE-acetaminophen (ROXICET) 5-325 MG per tablet, Take 1 tablet by mouth every 8 (eight) hours as needed for severe pain., Disp: 30 tablet, Rfl: 0 .  Sennosides (LAXATIVE PILLS PO), Take 8.6 mg by mouth as needed., Disp: , Rfl:  .  simvastatin (ZOCOR) 5 MG tablet, Take 1 tablet by mouth daily., Disp: , Rfl:  .  Venlafaxine HCl 150 MG TB24, Take 1.5 tablets (225 mg total) by mouth daily., Disp: 30 each, Rfl: 0 .  venlafaxine XR (EFFEXOR-XR) 75 MG 24 hr capsule, Take 1 capsule (75 mg total) by mouth daily., Disp: 30 capsule, Rfl: 0  No Known Allergies   Review of Systems  Constitutional: Positive for irritability and fatigue.  Respiratory: Negative for shortness of breath.   Cardiovascular: Negative for chest pain.  Gastrointestinal: Positive  for heartburn. Negative for dysphagia, nausea, vomiting and abdominal pain.  Musculoskeletal: Negative for myalgias.  Psychiatric/Behavioral: Positive for depression. The patient is nervous/anxious and has insomnia.     Objective  Filed Vitals:   08/05/15 0845  BP: 130/68  Pulse: 108  Temp: 98.8 F (37.1 C)  TempSrc: Oral  Resp: 20  Height: 5\' 3"  (1.6 m)  Weight: 145 lb 9.6 oz (66.044 kg)  SpO2: 95%    Physical Exam  Constitutional: She is oriented to person, place, and time and well-developed, well-nourished, and in no distress.  Neurological: She is alert and oriented to person,  place, and time.  Psychiatric: Mood, memory, affect and judgment normal.  Nursing note and vitals reviewed.  Assessment & Plan  1. Need for immunization against influenza  - Flu Vaccine QUAD 36+ mos PF IM (Fluarix & Fluzone Quad PF)  2. Anxiety and depression Symptoms stable on benzodiazepine anxiolytic and Effexor XR 225 mg daily. No side effects - clonazePAM (KLONOPIN) 1 MG tablet; Take 1 tablet (1 mg total) by mouth 3 (three) times daily as needed for anxiety.  Dispense: 90 tablet; Refill: 0 - venlafaxine XR (EFFEXOR-XR) 75 MG 24 hr capsule; Take 1 capsule (75 mg total) by mouth daily.  Dispense: 30 capsule; Refill: 0 - venlafaxine XR (EFFEXOR-XR) 150 MG 24 hr capsule; Take 1 capsule (150 mg total) by mouth daily with breakfast.  Dispense: 90 capsule; Refill: 0  3. Gastroesophageal reflux disease, esophagitis presence not specified  - omeprazole (PRILOSEC) 40 MG capsule; Take 1 capsule (40 mg total) by mouth daily.  Dispense: 90 capsule; Refill: 0  4. Dyslipidemia  - simvastatin (ZOCOR) 5 MG tablet; Take 1 tablet (5 mg total) by mouth daily at 6 PM.  Dispense: 90 tablet; Refill: 0   Kim Buchanan [name] 08/05/2015 8:50 AM

## 2015-08-09 ENCOUNTER — Telehealth: Payer: Self-pay

## 2015-08-10 ENCOUNTER — Encounter: Payer: Self-pay | Admitting: Family Medicine

## 2015-08-10 ENCOUNTER — Other Ambulatory Visit: Payer: Self-pay | Admitting: Family Medicine

## 2015-08-10 DIAGNOSIS — K219 Gastro-esophageal reflux disease without esophagitis: Secondary | ICD-10-CM

## 2015-08-10 MED ORDER — OMEPRAZOLE 40 MG PO CPDR: 40.0000 mg | DELAYED_RELEASE_CAPSULE | Freq: Every day | ORAL | Status: DC

## 2015-08-10 NOTE — Telephone Encounter (Signed)
Chart was opened by mistake please disreguard

## 2015-08-10 NOTE — Telephone Encounter (Signed)
Medication had to be reordered due to it was sent to the wrong pharmacy on 08/05/2015 it was sent to [facility] but should have been sent to [facility] order, called [facility] and cancelled order and refilled and sent to [facility] order on 08/10/2015

## 2015-08-10 NOTE — Telephone Encounter (Signed)
Pt is needing her stomach medicine for acid reflux. She said that they have sent a request but with no response. Pharm is [facility]

## 2015-09-12 ENCOUNTER — Telehealth: Payer: Self-pay

## 2015-09-12 DIAGNOSIS — F419 Anxiety disorder, unspecified: Secondary | ICD-10-CM

## 2015-09-12 DIAGNOSIS — F329 Major depressive disorder, single episode, unspecified: Secondary | ICD-10-CM

## 2015-09-12 MED ORDER — CLONAZEPAM 1 MG PO TABS: 1.0000 mg | ORAL_TABLET | Freq: Three times a day (TID) | ORAL | Status: DC | PRN

## 2015-09-12 NOTE — Telephone Encounter (Signed)
Medication has been refilled and is ready for patient pick up in office

## 2015-10-05 ENCOUNTER — Telehealth: Payer: Self-pay | Admitting: Family Medicine

## 2015-10-05 DIAGNOSIS — F419 Anxiety disorder, unspecified: Secondary | ICD-10-CM

## 2015-10-05 DIAGNOSIS — F329 Major depressive disorder, single episode, unspecified: Secondary | ICD-10-CM

## 2015-10-05 DIAGNOSIS — F32A Depression, unspecified: Secondary | ICD-10-CM

## 2015-10-05 NOTE — Telephone Encounter (Signed)
Patient has 2 pills left of the Effexor. She would like to know if you can fax a prescription to [place] or if she has to come and pick it up.

## 2015-10-06 MED ORDER — VENLAFAXINE HCL ER 75 MG PO CP24: 75.0000 mg | ORAL_CAPSULE | Freq: Every day | ORAL | Status: DC

## 2015-10-06 NOTE — Telephone Encounter (Signed)
Medication has been refilled and printed for patient pick up

## 2015-11-08 ENCOUNTER — Ambulatory Visit (INDEPENDENT_AMBULATORY_CARE_PROVIDER_SITE_OTHER): Payer: Medicare Other | Admitting: Family Medicine

## 2015-11-08 ENCOUNTER — Encounter: Payer: Self-pay | Admitting: Family Medicine

## 2015-11-08 VITALS — BP 127/79 | HR 97 | Temp 98.9°F | Resp 17 | Ht 63.0 in | Wt 143.8 lb

## 2015-11-08 DIAGNOSIS — F32A Depression, unspecified: Secondary | ICD-10-CM

## 2015-11-08 DIAGNOSIS — F329 Major depressive disorder, single episode, unspecified: Secondary | ICD-10-CM

## 2015-11-08 DIAGNOSIS — F418 Other specified anxiety disorders: Secondary | ICD-10-CM | POA: Diagnosis not present

## 2015-11-08 DIAGNOSIS — F419 Anxiety disorder, unspecified: Secondary | ICD-10-CM

## 2015-11-08 MED ORDER — CLONAZEPAM 1 MG PO TABS: 1.0000 mg | ORAL_TABLET | Freq: Three times a day (TID) | ORAL | Status: DC | PRN

## 2015-11-08 MED ORDER — VENLAFAXINE HCL ER 150 MG PO CP24: 150.0000 mg | ORAL_CAPSULE | Freq: Every day | ORAL | Status: DC

## 2015-11-08 MED ORDER — VENLAFAXINE HCL ER 75 MG PO CP24: 75.0000 mg | ORAL_CAPSULE | Freq: Every day | ORAL | Status: DC

## 2015-11-08 NOTE — Progress Notes (Signed)
Name: Kim Buchanan   MRN: [id number]    DOB: Sep 14, 1951   Date:11/08/2015       Progress Note  Subjective  Chief Complaint  Chief Complaint  Patient presents with  . Follow-up    3 mo  . Gastroesophageal Reflux  . Hyperlipidemia  . Medication Refill    Anxiety Presents for follow-up visit. Symptoms include excessive worry, insomnia, irritability and nervous/anxious behavior. Patient reports no restlessness. Symptoms occur most days.   Past treatments include benzodiazephines. The treatment provided significant relief. Compliance with prior treatments has been good.  Depression        This is a chronic problem.  The onset quality is gradual. The problem is unchanged.  Associated symptoms include fatigue and insomnia.  Associated symptoms include no helplessness, no hopelessness, no restlessness, no decreased interest and not sad.  Past treatments include SNRIs - Serotonin and norepinephrine reuptake inhibitors.  Compliance with treatment is good.  Past medical history includes anxiety.     Past Medical History  Diagnosis Date  . Depression   . Anxiety   . GERD (gastroesophageal reflux disease)   . Hyperlipidemia     History reviewed. No pertinent past surgical history.  Family History  Problem Relation Age of Onset  . Diabetes Mother   . Diabetes Sister   . Diabetes Brother   . Lymphoma Brother     Social History   Social History  . Marital Status: Married    Spouse Name: N/A  . Number of Children: N/A  . Years of Education: N/A   Occupational History  . Not on file.   Social History Main Topics  . Smoking status: Current Every Day Smoker -- 0.50 packs/day    Types: Cigarettes  . Smokeless tobacco: Never Used  . Alcohol Use: No  . Drug Use: No  . Sexual Activity: Not on file   Other Topics Concern  . Not on file   Social History Narrative     Current outpatient prescriptions:  .  aspirin 81 MG tablet, Take 81 mg by mouth daily. , Disp: , Rfl:  .   clonazePAM (KLONOPIN) 1 MG tablet, Take 1 tablet (1 mg total) by mouth 3 (three) times daily as needed for anxiety., Disp: 90 tablet, Rfl: 0 .  omeprazole (PRILOSEC) 40 MG capsule, Take 1 capsule (40 mg total) by mouth daily., Disp: 90 capsule, Rfl: 0 .  Sennosides (LAXATIVE PILLS PO), Take 8.6 mg by mouth as needed., Disp: , Rfl:  .  venlafaxine XR (EFFEXOR-XR) 150 MG 24 hr capsule, Take 1 capsule (150 mg total) by mouth daily with breakfast., Disp: 90 capsule, Rfl: 0 .  venlafaxine XR (EFFEXOR-XR) 75 MG 24 hr capsule, Take 1 capsule (75 mg total) by mouth daily., Disp: 30 capsule, Rfl: 0  No Known Allergies   Review of Systems  Constitutional: Positive for irritability and fatigue.  Psychiatric/Behavioral: Positive for depression. The patient is nervous/anxious and has insomnia.     Objective  Filed Vitals:   11/08/15 0822  BP: 127/79  Pulse: 97  Temp: 98.9 F (37.2 C)  TempSrc: Oral  Resp: 17  Height: 5\' 3"  (1.6 m)  Weight: 143 lb 12.8 oz (65.227 kg)  SpO2: 98%    Physical Exam  Constitutional: She is oriented to person, place, and time and well-developed, well-nourished, and in no distress.  Neurological: She is alert and oriented to person, place, and time.  Psychiatric: Mood, memory, affect and judgment normal.  Nursing note and  vitals reviewed.   Assessment & Plan  1. Anxiety and depression Symptoms responsive to Venlafaxine 225 mg daily and clonazepam 1 mg 3 times daily as needed. Refills provided and follow-up in 3 months - clonazePAM (KLONOPIN) 1 MG tablet; Take 1 tablet (1 mg total) by mouth 3 (three) times daily as needed for anxiety.  Dispense: 90 tablet; Refill: 0 - venlafaxine XR (EFFEXOR-XR) 150 MG 24 hr capsule; Take 1 capsule (150 mg total) by mouth at bedtime.  Dispense: 90 capsule; Refill: 0 - venlafaxine XR (EFFEXOR-XR) 75 MG 24 hr capsule; Take 1 capsule (75 mg total) by mouth at bedtime.  Dispense: 90 capsule; Refill: 0   Dontrez Pettis Asad A.  [facility] 11/08/2015 8:28 AM

## 2015-11-29 ENCOUNTER — Other Ambulatory Visit: Payer: Self-pay | Admitting: Family Medicine

## 2015-11-29 NOTE — Telephone Encounter (Signed)
Medication has been refilled and sent to [facility]

## 2015-12-08 ENCOUNTER — Other Ambulatory Visit: Payer: Self-pay | Admitting: Family Medicine

## 2015-12-08 DIAGNOSIS — F419 Anxiety disorder, unspecified: Secondary | ICD-10-CM

## 2015-12-08 DIAGNOSIS — F329 Major depressive disorder, single episode, unspecified: Secondary | ICD-10-CM

## 2015-12-08 NOTE — Telephone Encounter (Signed)
Pt needs refill on Clorazopam and both of her Effoxer.

## 2015-12-09 MED ORDER — CLONAZEPAM 1 MG PO TABS: 1.0000 mg | ORAL_TABLET | Freq: Three times a day (TID) | ORAL | Status: DC | PRN

## 2015-12-09 NOTE — Telephone Encounter (Signed)
Routed to Dr. Shah for approval 

## 2015-12-09 NOTE — Telephone Encounter (Signed)
Checking status on prescription refill.

## 2016-01-13 ENCOUNTER — Other Ambulatory Visit: Payer: Self-pay | Admitting: Family Medicine

## 2016-01-13 DIAGNOSIS — F329 Major depressive disorder, single episode, unspecified: Secondary | ICD-10-CM

## 2016-01-13 DIAGNOSIS — F419 Anxiety disorder, unspecified: Secondary | ICD-10-CM

## 2016-01-13 MED ORDER — CLONAZEPAM 1 MG PO TABS: 1.0000 mg | ORAL_TABLET | Freq: Three times a day (TID) | ORAL | Status: DC | PRN

## 2016-01-13 NOTE — Telephone Encounter (Signed)
Routed to Dr. Manuella Ghazi for refill approval

## 2016-01-13 NOTE — Telephone Encounter (Signed)
Requesting refill on Clonazepam

## 2016-01-24 NOTE — Telephone Encounter (Signed)
erro  neous encounter

## 2016-02-14 ENCOUNTER — Ambulatory Visit (INDEPENDENT_AMBULATORY_CARE_PROVIDER_SITE_OTHER): Payer: Medicare Other | Admitting: Family Medicine

## 2016-02-14 ENCOUNTER — Encounter: Payer: Self-pay | Admitting: Family Medicine

## 2016-02-14 ENCOUNTER — Ambulatory Visit
Admission: RE | Admit: 2016-02-14 | Discharge: 2016-02-14 | Disposition: A | Discharge status: Home / Self Care | Payer: Medicare Other | Source: Ambulatory Visit | Attending: Family Medicine | Admitting: Family Medicine

## 2016-02-14 VITALS — BP 129/70 | HR 95 | Temp 98.8°F | Resp 18 | Ht 63.0 in | Wt 144.8 lb

## 2016-02-14 DIAGNOSIS — M545 Low back pain, unspecified: Secondary | ICD-10-CM

## 2016-02-14 DIAGNOSIS — F418 Other specified anxiety disorders: Secondary | ICD-10-CM | POA: Diagnosis not present

## 2016-02-14 DIAGNOSIS — M47816 Spondylosis without myelopathy or radiculopathy, lumbar region: Secondary | ICD-10-CM | POA: Diagnosis not present

## 2016-02-14 DIAGNOSIS — K219 Gastro-esophageal reflux disease without esophagitis: Secondary | ICD-10-CM

## 2016-02-14 DIAGNOSIS — F419 Anxiety disorder, unspecified: Secondary | ICD-10-CM

## 2016-02-14 DIAGNOSIS — F329 Major depressive disorder, single episode, unspecified: Secondary | ICD-10-CM

## 2016-02-14 MED ORDER — VENLAFAXINE HCL ER 150 MG PO CP24: 150.0000 mg | ORAL_CAPSULE | Freq: Every day | ORAL | Status: DC

## 2016-02-14 MED ORDER — VENLAFAXINE HCL ER 75 MG PO CP24: 75.0000 mg | ORAL_CAPSULE | Freq: Every day | ORAL | Status: DC

## 2016-02-14 MED ORDER — CLONAZEPAM 1 MG PO TABS: 1.0000 mg | ORAL_TABLET | Freq: Three times a day (TID) | ORAL | Status: DC | PRN

## 2016-02-14 MED ORDER — OMEPRAZOLE 40 MG PO CPDR: 40.0000 mg | DELAYED_RELEASE_CAPSULE | Freq: Every day | ORAL | Status: DC

## 2016-02-14 NOTE — Progress Notes (Signed)
Name: Kim Buchanan   MRN: [id number]    DOB: 1951-03-28   Date:02/14/2016       Progress Note  Subjective  Chief Complaint  Chief Complaint  Patient presents with  . Follow-up    3 mo  . Medication Refill    venlafaxine 150 mg, 75 mg / clonazepam 1 mg / omperazole 40 mg     HPI  Anxiety: Pt. Presents for medication refills on Clonazepam taken 1 mg three times daily as needed. Symptoms include shakes, nervousness and anxious. Clonazepam relieves these symptoms.   Depression: Pt. Presents for medication refill on Venlafaxine XR 225 mg daily for depression. Symptoms include fatigue, somnolence, and sometimes depressed mood. Venlafaxine usually relieves these symptoms.  Gastroesophageal Reflux: Pt. Presents for medication refill, she is on Omeprazole 40 mg daily, symptoms controlled on therapy, otherwise has heartburn and reflux symptoms.  Back Pain: Pt. Presents for follow up of low back pain, present for 2 months, present in the center of the spine, at times it goes down into her hips on both sides but not usually. She has no history of trauma to lower back, takes Tylenol OTC as needed.     Past Medical History  Diagnosis Date  . Depression   . Anxiety   . GERD (gastroesophageal reflux disease)   . Hyperlipidemia     History reviewed. No pertinent past surgical history.  Family History  Problem Relation Age of Onset  . Diabetes Mother   . Diabetes Sister   . Diabetes Brother   . Lymphoma Brother     Social History   Social History  . Marital Status: Married    Spouse Name: N/A  . Number of Children: N/A  . Years of Education: N/A   Occupational History  . Not on file.   Social History Main Topics  . Smoking status: Current Every Day Smoker -- 0.50 packs/day    Types: Cigarettes  . Smokeless tobacco: Never Used  . Alcohol Use: No  . Drug Use: No  . Sexual Activity: Not on file   Other Topics Concern  . Not on file   Social History Narrative      Current outpatient prescriptions:  .  aspirin 81 MG tablet, Take 81 mg by mouth daily. , Disp: , Rfl:  .  clonazePAM (KLONOPIN) 1 MG tablet, Take 1 tablet (1 mg total) by mouth 3 (three) times daily as needed for anxiety., Disp: 90 tablet, Rfl: 0 .  omeprazole (PRILOSEC) 40 MG capsule, Take 1 capsule (40 mg total) by mouth daily., Disp: 90 capsule, Rfl: 0 .  Sennosides (LAXATIVE PILLS PO), Take 8.6 mg by mouth as needed., Disp: , Rfl:  .  venlafaxine XR (EFFEXOR-XR) 150 MG 24 hr capsule, Take 1 capsule (150 mg total) by mouth at bedtime., Disp: 90 capsule, Rfl: 0 .  venlafaxine XR (EFFEXOR-XR) 75 MG 24 hr capsule, Take 1 capsule (75 mg total) by mouth at bedtime., Disp: 90 capsule, Rfl: 0  No Known Allergies   Review of Systems  Gastrointestinal: Negative for heartburn, nausea, vomiting and abdominal pain.  Musculoskeletal: Positive for back pain.  Psychiatric/Behavioral: Positive for depression. The patient is nervous/anxious. The patient does not have insomnia.       Objective  Filed Vitals:   02/14/16 0844  BP: 129/70  Pulse: 95  Temp: 98.8 F (37.1 C)  TempSrc: Oral  Resp: 18  Height: 5\' 3"  (1.6 m)  Weight: 144 lb 12.8 oz (65.681 kg)  SpO2:  96%    Physical Exam  Constitutional: She is well-developed, well-nourished, and in no distress.  HENT:  Head: Normocephalic and atraumatic.  Cardiovascular: Normal rate and regular rhythm.   Pulmonary/Chest: Effort normal and breath sounds normal.  Musculoskeletal:       Lumbar back: She exhibits tenderness, pain and spasm.       Back:  Psychiatric: Mood and judgment normal. She has a flat affect.  Nursing note and vitals reviewed.    Assessment & Plan  1. Gastroesophageal reflux disease, esophagitis presence not specified  - omeprazole (PRILOSEC) 40 MG capsule; Take 1 capsule (40 mg total) by mouth daily.  Dispense: 90 capsule; Refill: 1  2. Anxiety and depression Somewhat worse as her brother-in-law recently  passed away, otherwise controlled. Continue on clonazepam when needed and Effexor daily. - clonazePAM (KLONOPIN) 1 MG tablet; Take 1 tablet (1 mg total) by mouth 3 (three) times daily as needed for anxiety.  Dispense: 90 tablet; Refill: 0 - venlafaxine XR (EFFEXOR-XR) 150 MG 24 hr capsule; Take 1 capsule (150 mg total) by mouth at bedtime.  Dispense: 90 capsule; Refill: 1 - venlafaxine XR (EFFEXOR-XR) 75 MG 24 hr capsule; Take 1 capsule (75 mg total) by mouth at bedtime.  Dispense: 90 capsule; Refill: 1  3. Midline low back pain without sciatica Likely related to muscle spasm, we'll rule out acute osseous pathology with x-ray. - DG Lumbar Spine Complete; Future - DG Sacrum/Coccyx; Future   Rilyn Upshaw Asad A. [facility] 02/14/2016 8:48 AM

## 2016-02-15 ENCOUNTER — Telehealth: Payer: Self-pay | Admitting: Family Medicine

## 2016-02-15 NOTE — Telephone Encounter (Signed)
Patient checking status on xray results. Please return call

## 2016-02-17 NOTE — Telephone Encounter (Signed)
X-ray results have been reported to patient

## 2016-02-20 ENCOUNTER — Telehealth: Payer: Self-pay | Admitting: Family Medicine

## 2016-02-20 NOTE — Telephone Encounter (Signed)
Patient was told by Dr Manuella Ghazi that once he reviewed her results that he would call her something in for pain. As of today she has not received anything.

## 2016-02-21 ENCOUNTER — Other Ambulatory Visit: Payer: Self-pay | Admitting: Family Medicine

## 2016-02-21 DIAGNOSIS — M5137 Other intervertebral disc degeneration, lumbosacral region: Secondary | ICD-10-CM

## 2016-02-21 DIAGNOSIS — M5136 Other intervertebral disc degeneration, lumbar region: Secondary | ICD-10-CM | POA: Insufficient documentation

## 2016-02-21 DIAGNOSIS — M51369 Other intervertebral disc degeneration, lumbar region without mention of lumbar back pain or lower extremity pain: Secondary | ICD-10-CM | POA: Insufficient documentation

## 2016-02-21 MED ORDER — MELOXICAM 7.5 MG PO TABS: 7.5000 mg | ORAL_TABLET | Freq: Every day | ORAL | Status: DC

## 2016-02-21 NOTE — Telephone Encounter (Signed)
X-ray of lumbar spine shows degenerative disc disease, results reviewed with patient and started on meloxicam 7.5 mg daily, prescription called into pharmacy. Follow-up in one month

## 2016-02-22 NOTE — Telephone Encounter (Signed)
Left message to inform patient to schedule follow up appointment

## 2016-03-05 ENCOUNTER — Telehealth: Payer: Self-pay | Admitting: Family Medicine

## 2016-03-05 NOTE — Telephone Encounter (Signed)
Pt has some concerns about her meds and would like a call back.

## 2016-03-06 NOTE — Telephone Encounter (Signed)
Attempted to call pt back 3 times and each time her phone would disconnect.

## 2016-03-22 ENCOUNTER — Encounter: Payer: Self-pay | Admitting: Family Medicine

## 2016-03-22 ENCOUNTER — Ambulatory Visit (INDEPENDENT_AMBULATORY_CARE_PROVIDER_SITE_OTHER): Payer: Medicare Other | Admitting: Family Medicine

## 2016-03-22 VITALS — BP 127/71 | HR 82 | Temp 97.9°F | Resp 16 | Ht 63.0 in | Wt 148.7 lb

## 2016-03-22 DIAGNOSIS — K219 Gastro-esophageal reflux disease without esophagitis: Secondary | ICD-10-CM

## 2016-03-22 DIAGNOSIS — R1032 Left lower quadrant pain: Secondary | ICD-10-CM

## 2016-03-22 DIAGNOSIS — F418 Other specified anxiety disorders: Secondary | ICD-10-CM

## 2016-03-22 DIAGNOSIS — K648 Other hemorrhoids: Secondary | ICD-10-CM

## 2016-03-22 DIAGNOSIS — K644 Residual hemorrhoidal skin tags: Secondary | ICD-10-CM

## 2016-03-22 DIAGNOSIS — F419 Anxiety disorder, unspecified: Secondary | ICD-10-CM

## 2016-03-22 DIAGNOSIS — F329 Major depressive disorder, single episode, unspecified: Secondary | ICD-10-CM

## 2016-03-22 MED ORDER — VENLAFAXINE HCL ER 150 MG PO CP24: 150.0000 mg | ORAL_CAPSULE | Freq: Every day | ORAL | Status: DC

## 2016-03-22 MED ORDER — OMEPRAZOLE 40 MG PO CPDR: 40.0000 mg | DELAYED_RELEASE_CAPSULE | Freq: Every day | ORAL | Status: DC

## 2016-03-22 MED ORDER — VENLAFAXINE HCL ER 75 MG PO CP24: 75.0000 mg | ORAL_CAPSULE | Freq: Every day | ORAL | Status: DC

## 2016-03-22 MED ORDER — HYDROCODONE-ACETAMINOPHEN 5-325 MG PO TABS: 1.0000 | ORAL_TABLET | Freq: Three times a day (TID) | ORAL | Status: DC | PRN

## 2016-03-22 MED ORDER — CLONAZEPAM 1 MG PO TABS: 1.0000 mg | ORAL_TABLET | Freq: Three times a day (TID) | ORAL | Status: DC | PRN

## 2016-03-22 NOTE — Progress Notes (Signed)
Name: Kim Buchanan   MRN: [id number]    DOB: [birth year]   Date:03/22/2016       Progress Note  Subjective  Chief Complaint  Chief Complaint  Patient presents with  . Follow-up    1 mo  . Hyperlipidemia  . Gastroesophageal Reflux  . Medication Refill    Gastroesophageal Reflux She complains of abdominal pain and heartburn. She reports no coughing, no dysphagia or no nausea. This is a chronic problem. The problem has been unchanged. Associated symptoms include fatigue. She has tried a PPI for the symptoms.  Anxiety Presents for follow-up visit. The problem has been unchanged. Symptoms include nervous/anxious behavior and panic. Patient reports no insomnia, nausea or suicidal ideas.   Her past medical history is significant for anxiety/panic attacks. Past treatments include benzodiazephines and non-SSRI antidepressants. The treatment provided significant relief. Compliance with prior treatments has been good.  Depression        This is a chronic problem.The problem is unchanged.  Associated symptoms include fatigue, hopelessness, irritable and sad.  Associated symptoms include does not have insomnia and no suicidal ideas.  Past treatments include SNRIs - Serotonin and norepinephrine reuptake inhibitors.  Compliance with treatment is good.  Past medical history includes anxiety.    Pt. Presents for follow up of low back pain, present in her lower back, radiating towards the front on both sides and in her supra-pubic area. Pain worse with bending over, also passing blood (although not sure if its coming from her rectum or through her vaginal area). Pt. Has pain with straining at times but not always.  No blood in her urine. She was prescribed Meloxicam for her low back pain but could not take it due to side effects (dizziness, chest pain)   Past Medical History  Diagnosis Date  . Depression   . Anxiety   . GERD (gastroesophageal reflux disease)   . Hyperlipidemia     History  reviewed. No pertinent past surgical history.  Family History  Problem Relation Age of Onset  . Diabetes Mother   . Diabetes Sister   . Diabetes Brother   . Lymphoma Brother     Social History   Social History  . Marital Status: Married    Spouse Name: N/A  . Number of Children: N/A  . Years of Education: N/A   Occupational History  . Not on file.   Social History Main Topics  . Smoking status: Current Every Day Smoker -- 0.50 packs/day    Types: Cigarettes  . Smokeless tobacco: Never Used  . Alcohol Use: No  . Drug Use: No  . Sexual Activity: Not on file   Other Topics Concern  . Not on file   Social History Narrative     Current outpatient prescriptions:  .  aspirin 81 MG tablet, Take 81 mg by mouth daily. , Disp: , Rfl:  .  clonazePAM (KLONOPIN) 1 MG tablet, Take 1 tablet (1 mg total) by mouth 3 (three) times daily as needed for anxiety., Disp: 90 tablet, Rfl: 0 .  omeprazole (PRILOSEC) 40 MG capsule, Take 1 capsule (40 mg total) by mouth daily., Disp: 90 capsule, Rfl: 1 .  Sennosides (LAXATIVE PILLS PO), Take 8.6 mg by mouth as needed., Disp: , Rfl:  .  venlafaxine XR (EFFEXOR-XR) 150 MG 24 hr capsule, Take 1 capsule (150 mg total) by mouth at bedtime., Disp: 90 capsule, Rfl: 1 .  venlafaxine XR (EFFEXOR-XR) 75 MG 24 hr capsule, Take 1 capsule (75  mg total) by mouth at bedtime., Disp: 90 capsule, Rfl: 1 .  meloxicam (MOBIC) 7.5 MG tablet, Take 1 tablet (7.5 mg total) by mouth daily. (Patient not taking: Reported on 03/22/2016), Disp: 30 tablet, Rfl: 0  No Known Allergies   Review of Systems  Constitutional: Positive for chills and fatigue. Negative for fever.  Respiratory: Negative for cough.   Gastrointestinal: Positive for heartburn, abdominal pain and blood in stool. Negative for dysphagia, nausea, vomiting, diarrhea and constipation.  Genitourinary: Negative for hematuria.  Musculoskeletal: Positive for back pain.  Psychiatric/Behavioral: Positive for  depression. Negative for suicidal ideas. The patient is nervous/anxious. The patient does not have insomnia.     Objective  Filed Vitals:   03/22/16 0831  BP: 127/71  Pulse: 82  Temp: 97.9 F (36.6 C)  TempSrc: Oral  Resp: 16  Height: 5\' 3"  (1.6 m)  Weight: 148 lb 11.2 oz (67.45 kg)  SpO2: 98%    Physical Exam  Constitutional: She is oriented to person, place, and time and well-developed, well-nourished, and in no distress. She is irritable.  Cardiovascular: Normal rate and regular rhythm.   Pulmonary/Chest: Effort normal and breath sounds normal.  Abdominal: Bowel sounds are normal. There is tenderness in the left lower quadrant.  Genitourinary: Rectal exam shows external hemorrhoid. Rectal exam shows no mass and no tenderness.  No stool in rectal vault so Hemoccult could not be performed.  Neurological: She is alert and oriented to person, place, and time.  Psychiatric: Mood, memory, affect and judgment normal.  Nursing note and vitals reviewed.    Assessment & Plan  1. Continuous LLQ abdominal pain Given the history of present illness and exam, we will rule out acute diverticulitis obtaining CT abdomen and pelvis. Obtain white count, urinalysis, liver and kidney function as well. Started on hydrocodone to be taken every 8 hours as needed for relief. Proceed to the ER if pain is worse and/or she has additional symptoms. - CT Abdomen Pelvis Wo Contrast; Future - CBC with Differential - Comprehensive Metabolic Panel (CMET) - Urinalysis, Routine w reflex microscopic - Urine Culture - HYDROcodone-acetaminophen (NORCO/VICODIN) 5-325 MG tablet; Take 1 tablet by mouth every 8 (eight) hours as needed for moderate pain.  Dispense: 30 tablet; Refill: 0  2. Anxiety and depression Stable, responsive to clonazepam and venlafaxine. Refills provided - clonazePAM (KLONOPIN) 1 MG tablet; Take 1 tablet (1 mg total) by mouth 3 (three) times daily as needed for anxiety.  Dispense: 90  tablet; Refill: 0 - venlafaxine XR (EFFEXOR-XR) 150 MG 24 hr capsule; Take 1 capsule (150 mg total) by mouth at bedtime.  Dispense: 90 capsule; Refill: 1 - venlafaxine XR (EFFEXOR-XR) 75 MG 24 hr capsule; Take 1 capsule (75 mg total) by mouth at bedtime.  Dispense: 90 capsule; Refill: 1  3. Gastroesophageal reflux disease, esophagitis presence not specified Continue on PPI, refills provided - omeprazole (PRILOSEC) 40 MG capsule; Take 1 capsule (40 mg total) by mouth daily.  Dispense: 90 capsule; Refill: 1  4. Prolapsed external hemorrhoids Patient has seen gastroenterology and general surgery in the past for hemorrhoids. Hemoccult could not be obtained due to the absence of stool in the rectal vault.  - Ambulatory referral to Gastroenterology    Dossie Der Asad A. [facility] 03/22/2016 8:37 AM

## 2016-03-26 ENCOUNTER — Encounter: Payer: Self-pay | Admitting: General Surgery

## 2016-03-26 ENCOUNTER — Ambulatory Visit (INDEPENDENT_AMBULATORY_CARE_PROVIDER_SITE_OTHER): Payer: Medicare Other | Admitting: General Surgery

## 2016-03-26 VITALS — BP 130/70 | HR 74 | Resp 12 | Ht 62.0 in | Wt 144.0 lb

## 2016-03-26 DIAGNOSIS — K625 Hemorrhage of anus and rectum: Secondary | ICD-10-CM

## 2016-03-26 DIAGNOSIS — K5900 Constipation, unspecified: Secondary | ICD-10-CM

## 2016-03-26 DIAGNOSIS — R58 Hemorrhage, not elsewhere classified: Secondary | ICD-10-CM

## 2016-03-26 LAB — URINALYSIS, ROUTINE W REFLEX MICROSCOPIC
Bilirubin, UA: NEGATIVE
Glucose, UA: NEGATIVE
Ketones, UA: NEGATIVE
Nitrite, UA: NEGATIVE
Protein, UA: NEGATIVE
RBC, UA: NEGATIVE
Specific Gravity, UA: 1.022 (ref 1.005–1.030)
Urobilinogen, Ur: 0.2 mg/dL (ref 0.2–1.0)
pH, UA: 5.5 (ref 5.0–7.5)

## 2016-03-26 LAB — COMPREHENSIVE METABOLIC PANEL
ALT: 16 IU/L (ref 0–32)
AST: 14 IU/L (ref 0–40)
Albumin/Globulin Ratio: 1.5 (ref 1.2–2.2)
Albumin: 3.8 g/dL (ref 3.6–4.8)
Alkaline Phosphatase: 63 IU/L (ref 39–117)
BUN/Creatinine Ratio: 20 (ref 12–28)
BUN: 16 mg/dL (ref 8–27)
Bilirubin Total: <0.2 mg/dL (ref 0.0–1.2)
CO2: 24 mmol/L (ref 18–29)
Calcium: 8.8 mg/dL (ref 8.7–10.3)
Chloride: 101 mmol/L (ref 96–106)
Creatinine, Ser: 0.82 mg/dL (ref 0.57–1.00)
GFR calc Af Amer: 88 mL/min/{1.73_m2} (ref >59)
GFR calc non Af Amer: 76 mL/min/{1.73_m2} (ref >59)
Globulin, Total: 2.5 g/dL (ref 1.5–4.5)
Glucose: 89 mg/dL (ref 65–99)
Potassium: 4.5 mmol/L (ref 3.5–5.2)
Sodium: 141 mmol/L (ref 134–144)
Total Protein: 6.3 g/dL (ref 6.0–8.5)

## 2016-03-26 LAB — CBC WITH DIFFERENTIAL/PLATELET
Basophils Absolute: 0 10*3/uL (ref 0.0–0.2)
Basos: 0 %
EOS (ABSOLUTE): 0.1 10*3/uL (ref 0.0–0.4)
Eos: 1 %
Hematocrit: 38.8 % (ref 34.0–46.6)
Hemoglobin: 13.1 g/dL (ref 11.1–15.9)
Immature Grans (Abs): 0 10*3/uL (ref 0.0–0.1)
Immature Granulocytes: 0 %
Lymphocytes Absolute: 3.5 10*3/uL — ABNORMAL HIGH (ref 0.7–3.1)
Lymphs: 45 %
MCH: 31.8 pg (ref 26.6–33.0)
MCHC: 33.8 g/dL (ref 31.5–35.7)
MCV: 94 fL (ref 79–97)
Monocytes Absolute: 0.5 10*3/uL (ref 0.1–0.9)
Monocytes: 7 %
Neutrophils Absolute: 3.5 10*3/uL (ref 1.4–7.0)
Neutrophils: 47 %
Platelets: 233 10*3/uL (ref 150–379)
RBC: 4.12 x10E6/uL (ref 3.77–5.28)
RDW: 13.9 % (ref 12.3–15.4)
WBC: 7.6 10*3/uL (ref 3.4–10.8)

## 2016-03-26 LAB — MICROSCOPIC EXAMINATION: Casts: NONE SEEN /lpf

## 2016-03-26 LAB — URINE CULTURE

## 2016-03-26 NOTE — Progress Notes (Signed)
Patient ID: Kim Buchanan, female   DOB: 1951/08/04, 65 y.o.   MRN: [id number]  Chief Complaint  Patient presents with  . Other    hemorrhoids    HPI Kim Buchanan is a 65 y.o. female here today for a evaluation of hemorrhoids. Patient states she noticed this about a month ago. She states there is bright red blood on the paper and in the bowl. Moves her bowels every three days. Last colonoscopy done  2014 in [place]. When she was evaluated in 2016 for rectal symptoms she was originally told the GI service that she had hemorrhoids. Surgical exam at that time by Chauncey Reading, M.D. identified rectal prolapse. She underwent laparoscopic repair at [facility]. No recurrent symptoms. Last episode of bleeding was 2 weeks ago. Patient reports she chronically makes use of senna compound for constipation. She states she has been having lower abdominal pain for about two months. Increasing constipation with recently prescribed narcotics.     Husband  Kim Buchanan present.  I personally reviewed the patient's history. HPI  Past Medical History  Diagnosis Date  . Depression   . Anxiety   . GERD (gastroesophageal reflux disease)   . Hyperlipidemia     Past Surgical History  Procedure Laterality Date  . Rectal surgery  06/28/2015    Rectal prolapse, laparoscopic rectopexy the [place], MD; [facility]  . Colonoscopy  2014    Family History  Problem Relation Age of Onset  . Diabetes Mother   . Diabetes Sister   . Diabetes Brother   . Lymphoma Brother     Social History Social History  Substance Use Topics  . Smoking status: Current Every Day Smoker -- 0.50 packs/day    Types: Cigarettes  . Smokeless tobacco: Never Used  . Alcohol Use: No    No Known Allergies  Current Outpatient Prescriptions  Medication Sig Dispense Refill  . aspirin 81 MG tablet Take 81 mg by mouth daily.     . clonazePAM (KLONOPIN) 1 MG tablet Take 1 tablet (1 mg total) by mouth 3 (three) times daily as needed for  anxiety. 90 tablet 0  . HYDROcodone-acetaminophen (NORCO/VICODIN) 5-325 MG tablet Take 1 tablet by mouth every 8 (eight) hours as needed for moderate pain. 30 tablet 0  . omeprazole (PRILOSEC) 40 MG capsule Take 1 capsule (40 mg total) by mouth daily. 90 capsule 1  . Sennosides (LAXATIVE PILLS PO) Take 8.6 mg by mouth as needed.    . venlafaxine XR (EFFEXOR-XR) 150 MG 24 hr capsule Take 1 capsule (150 mg total) by mouth at bedtime. 90 capsule 1  . venlafaxine XR (EFFEXOR-XR) 75 MG 24 hr capsule Take 1 capsule (75 mg total) by mouth at bedtime. 90 capsule 1   No current facility-administered medications for this visit.    Review of Systems Review of Systems  Constitutional: Negative.   Respiratory: Negative.   Cardiovascular: Negative.   Gastrointestinal: Positive for anal bleeding.    Blood pressure 130/70, pulse 74, resp. rate 12, height 5\' 2"  (1.575 m), weight 144 lb (65.318 kg).  Physical Exam Physical Exam  Constitutional: She is oriented to person, place, and time. She appears well-developed and well-nourished.  Eyes: Conjunctivae are normal. No scleral icterus.  Neck: Neck supple.  Cardiovascular: Normal rate, regular rhythm and normal heart sounds.   Pulmonary/Chest: Effort normal and breath sounds normal.  Abdominal: Soft. Bowel sounds are normal. There is no tenderness.  Genitourinary: Rectal exam shows internal hemorrhoid.     Lymphadenopathy:  She has no cervical adenopathy.  Neurological: She is alert and oriented to person, place, and time.  Skin: Skin is dry.    Data Reviewed [facility] operative note reviewed: Laparoscopic mobilization of the rectosigmoid and rectopexy.  PCP notes of May 2017.  2014 endoscopy notes not available.  Assessment    Constipation, long-standing, recently aggravated by narcotics.  Likely anorectal source based on the patient's report of bright red blood on the toilet paper, in the commode and coating the stool. No visible pathology  on endoscopy.    Plan    Procrit use of systems softeners and laxatives reviewed. Hopefully she'll be able take her off the senna compound.  PCP note had suggested possibility of CT scan. On her present exam I'm not sure I see an indication to leave final determination up to her primary care physician.   Patient to return as needed. Patient to try i capful of miralax daily and continue 2 metamucil caps daily. PCP:  Keith Rake  This information has been scribed by Gaspar Cola CMA.     Robert Bellow 03/26/2016, 9:20 PM

## 2016-03-26 NOTE — Patient Instructions (Addendum)
Patient to return as needed. Patient to try a cap fill of miralax daily. Marland Kitchen

## 2016-04-11 ENCOUNTER — Ambulatory Visit (INDEPENDENT_AMBULATORY_CARE_PROVIDER_SITE_OTHER): Payer: Medicare Other | Admitting: Family Medicine

## 2016-04-11 ENCOUNTER — Encounter: Payer: Self-pay | Admitting: Family Medicine

## 2016-04-11 ENCOUNTER — Other Ambulatory Visit: Payer: Self-pay

## 2016-04-11 VITALS — BP 118/72 | HR 92 | Temp 98.0°F | Resp 17 | Ht 62.0 in | Wt 151.0 lb

## 2016-04-11 DIAGNOSIS — M545 Low back pain, unspecified: Secondary | ICD-10-CM

## 2016-04-11 MED ORDER — HYDROCODONE-ACETAMINOPHEN 10-325 MG PO TABS: 1.0000 | ORAL_TABLET | Freq: Three times a day (TID) | ORAL | Status: DC | PRN

## 2016-04-11 NOTE — Progress Notes (Signed)
Name: Kim Buchanan   MRN: [id number]    DOB: 1951-06-03   Date:04/11/2016       Progress Note  Subjective  Chief Complaint  Chief Complaint  Patient presents with  . Back Pain    Back Pain This is a chronic problem. The current episode started more than 1 month ago. The problem is unchanged. The pain is present in the lumbar spine. The pain is at a severity of 10/10. The pain is severe. The symptoms are aggravated by lying down. Pertinent negatives include no leg pain, tingling or weakness. She has tried analgesics for the symptoms.    Past Medical History  Diagnosis Date  . Depression   . Anxiety   . GERD (gastroesophageal reflux disease)   . Hyperlipidemia     Past Surgical History  Procedure Laterality Date  . Rectal surgery  06/28/2015    Rectal prolapse, laparoscopic rectopexy the coldCharmont, MD; [facility]  . Colonoscopy  2014    Family History  Problem Relation Age of Onset  . Diabetes Mother   . Diabetes Sister   . Diabetes Brother   . Lymphoma Brother     Social History   Social History  . Marital Status: Married    Spouse Name: N/A  . Number of Children: N/A  . Years of Education: N/A   Occupational History  . Not on file.   Social History Main Topics  . Smoking status: Current Every Day Smoker -- 0.50 packs/day    Types: Cigarettes  . Smokeless tobacco: Never Used  . Alcohol Use: No  . Drug Use: No  . Sexual Activity: Not on file   Other Topics Concern  . Not on file   Social History Narrative     Current outpatient prescriptions:  .  aspirin 81 MG tablet, Take 81 mg by mouth daily. , Disp: , Rfl:  .  clonazePAM (KLONOPIN) 1 MG tablet, Take 1 tablet (1 mg total) by mouth 3 (three) times daily as needed for anxiety., Disp: 90 tablet, Rfl: 0 .  omeprazole (PRILOSEC) 40 MG capsule, Take 1 capsule (40 mg total) by mouth daily., Disp: 90 capsule, Rfl: 1 .  Sennosides (LAXATIVE PILLS PO), Take 8.6 mg by mouth as needed., Disp: , Rfl:  .   venlafaxine XR (EFFEXOR-XR) 150 MG 24 hr capsule, Take 1 capsule (150 mg total) by mouth at bedtime., Disp: 90 capsule, Rfl: 1 .  venlafaxine XR (EFFEXOR-XR) 75 MG 24 hr capsule, Take 1 capsule (75 mg total) by mouth at bedtime., Disp: 90 capsule, Rfl: 1 .  HYDROcodone-acetaminophen (NORCO/VICODIN) 5-325 MG tablet, Take 1 tablet by mouth every 8 (eight) hours as needed for moderate pain. (Patient not taking: Reported on 04/11/2016), Disp: 30 tablet, Rfl: 0  No Known Allergies   Review of Systems  Musculoskeletal: Positive for back pain.  Neurological: Negative for tingling and weakness.     Objective  Filed Vitals:   04/11/16 0826  BP: 118/72  Pulse: 92  Temp: 98 F (36.7 C)  TempSrc: Oral  Resp: 17  Height: 5\' 2"  (1.575 m)  Weight: 151 lb (68.493 kg)  SpO2: 96%    Physical Exam  Constitutional: She is oriented to person, place, and time.  Musculoskeletal:       Lumbar back: She exhibits tenderness and pain.       Back:  Tenderness over the right lower back extending into right gluteal area  Neurological: She is alert and oriented to person, place, and time.  Nursing note and vitals reviewed.      Assessment & Plan  1. Midline low back pain without sciatica X-ray of lumbar spine shows diffuse arthritis throughout, hydrocodone 5 mg has not helped to relieve her pain. We will increase Hydrocodone to 10 mg every 8 hours as needed, advised to take hydrocodone at least 6-7 hours apart from clonazepam. Follow-up in one month. - HYDROcodone-acetaminophen (NORCO) 10-325 MG tablet; Take 1 tablet by mouth every 8 (eight) hours as needed.  Dispense: 90 tablet; Refill: 0   Carmelina Balducci Asad A. [facility] 04/11/2016 8:44 AM

## 2016-04-27 ENCOUNTER — Ambulatory Visit: Admission: RE | Admit: 2016-04-27 | Payer: Medicare Other | Source: Ambulatory Visit

## 2016-04-30 ENCOUNTER — Other Ambulatory Visit: Payer: Self-pay | Admitting: Family Medicine

## 2016-04-30 DIAGNOSIS — R1032 Left lower quadrant pain: Secondary | ICD-10-CM

## 2016-05-01 ENCOUNTER — Other Ambulatory Visit: Payer: Self-pay | Admitting: Family Medicine

## 2016-05-01 ENCOUNTER — Telehealth: Payer: Self-pay | Admitting: Family Medicine

## 2016-05-01 ENCOUNTER — Ambulatory Visit
Admission: RE | Admit: 2016-05-01 | Discharge: 2016-05-01 | Disposition: A | Discharge status: Home / Self Care | Payer: Medicare Other | Source: Ambulatory Visit | Attending: Family Medicine | Admitting: Family Medicine

## 2016-05-01 DIAGNOSIS — R1032 Left lower quadrant pain: Secondary | ICD-10-CM | POA: Diagnosis not present

## 2016-05-01 DIAGNOSIS — F419 Anxiety disorder, unspecified: Secondary | ICD-10-CM

## 2016-05-01 DIAGNOSIS — F329 Major depressive disorder, single episode, unspecified: Secondary | ICD-10-CM

## 2016-05-01 DIAGNOSIS — K760 Fatty (change of) liver, not elsewhere classified: Secondary | ICD-10-CM | POA: Insufficient documentation

## 2016-05-01 DIAGNOSIS — F32A Depression, unspecified: Secondary | ICD-10-CM

## 2016-05-01 MED ORDER — CLONAZEPAM 1 MG PO TABS: 1.0000 mg | ORAL_TABLET | Freq: Three times a day (TID) | ORAL | Status: DC | PRN

## 2016-05-01 MED ORDER — IOPAMIDOL (ISOVUE-300) INJECTION 61%
100.0000 mL | Freq: Once | INTRAVENOUS | Status: AC | PRN
  Administered 2016-05-01: 100 mL via INTRAVENOUS

## 2016-05-01 NOTE — Telephone Encounter (Signed)
Prescription for clonazepam is ready for pickup

## 2016-05-01 NOTE — Telephone Encounter (Signed)
Patient is completely out of ClonazePam 1 mg.

## 2016-05-02 NOTE — Telephone Encounter (Signed)
Called patient and she was verbally informed that prescription is ready for pickup.

## 2016-05-07 NOTE — Telephone Encounter (Signed)
Patient notified of CT results. Will discuss at appointment on July 25th

## 2016-05-08 ENCOUNTER — Telehealth: Payer: Self-pay | Admitting: Family Medicine

## 2016-05-08 NOTE — Telephone Encounter (Signed)
Please schedule patient for an appointment for medication refills. 

## 2016-05-08 NOTE — Telephone Encounter (Signed)
Pt requesting a refill on pain meds for her back pain. Please advise.

## 2016-05-08 NOTE — Telephone Encounter (Signed)
Pt informed and she has an appt

## 2016-05-10 ENCOUNTER — Encounter: Payer: Self-pay | Admitting: Family Medicine

## 2016-05-10 ENCOUNTER — Ambulatory Visit (INDEPENDENT_AMBULATORY_CARE_PROVIDER_SITE_OTHER): Payer: Medicare Other | Admitting: Family Medicine

## 2016-05-10 VITALS — BP 110/72 | HR 100 | Temp 98.8°F | Resp 16 | Ht 62.0 in | Wt 153.0 lb

## 2016-05-10 DIAGNOSIS — N949 Unspecified condition associated with female genital organs and menstrual cycle: Secondary | ICD-10-CM

## 2016-05-10 DIAGNOSIS — M5136 Other intervertebral disc degeneration, lumbar region: Secondary | ICD-10-CM | POA: Diagnosis not present

## 2016-05-10 MED ORDER — HYDROCODONE-ACETAMINOPHEN 10-325 MG PO TABS: 1.0000 | ORAL_TABLET | Freq: Three times a day (TID) | ORAL | Status: DC | PRN

## 2016-05-10 NOTE — Progress Notes (Signed)
Name: Kim Buchanan   MRN: [id number]    DOB: [birth year]   Date:05/10/2016       Progress Note  Subjective  Chief Complaint  Chief Complaint  Patient presents with  . Follow-up    xrays    HPI  Degenerative Disc Disease:  Arthritis throughout the lumbar spine, patient reports pain in the right lower back, difficulty with activities such as bathing, cleaning up (being on her feet etc). She has been on Hydrocodone-Acetaminophen 10-325 mg every 8 hours as needed, which seems to help, has not tried physical therapy (and does not believe that she can participate in physical therapy).  Right Adnexal Cyst: CT Scan Abdomen and Pelvis revealed a 1.7 cm right adnexal cyst, she reports no pain in the right lower quadrant but wants the cyst excsied. Will be referred to Gynecology.   Past Medical History  Diagnosis Date  . Depression   . Anxiety   . GERD (gastroesophageal reflux disease)   . Hyperlipidemia     Past Surgical History  Procedure Laterality Date  . Rectal surgery  06/28/2015    Rectal prolapse, laparoscopic rectopexy the coldCharmont, MD; [facility]  . Colonoscopy  2014    Family History  Problem Relation Age of Onset  . Diabetes Mother   . Diabetes Sister   . Diabetes Brother   . Lymphoma Brother     Social History   Social History  . Marital Status: Married    Spouse Name: N/A  . Number of Children: N/A  . Years of Education: N/A   Occupational History  . Not on file.   Social History Main Topics  . Smoking status: Current Every Day Smoker -- 0.50 packs/day    Types: Cigarettes  . Smokeless tobacco: Never Used  . Alcohol Use: No  . Drug Use: No  . Sexual Activity: Not on file   Other Topics Concern  . Not on file   Social History Narrative     Current outpatient prescriptions:  .  aspirin 81 MG tablet, Take 81 mg by mouth daily. , Disp: , Rfl:  .  clonazePAM (KLONOPIN) 1 MG tablet, Take 1 tablet (1 mg total) by mouth 3 (three) times daily as needed  for anxiety., Disp: 90 tablet, Rfl: 0 .  HYDROcodone-acetaminophen (NORCO) 10-325 MG tablet, Take 1 tablet by mouth every 8 (eight) hours as needed., Disp: 90 tablet, Rfl: 0 .  omeprazole (PRILOSEC) 40 MG capsule, Take 1 capsule (40 mg total) by mouth daily., Disp: 90 capsule, Rfl: 1 .  Sennosides (LAXATIVE PILLS PO), Take 8.6 mg by mouth as needed., Disp: , Rfl:  .  venlafaxine XR (EFFEXOR-XR) 150 MG 24 hr capsule, Take 1 capsule (150 mg total) by mouth at bedtime., Disp: 90 capsule, Rfl: 1 .  venlafaxine XR (EFFEXOR-XR) 75 MG 24 hr capsule, Take 1 capsule (75 mg total) by mouth at bedtime., Disp: 90 capsule, Rfl: 1  No Known Allergies   Review of Systems  Gastrointestinal: Negative for abdominal pain.  Musculoskeletal: Positive for back pain and joint pain.    Objective  Filed Vitals:   05/10/16 0832  BP: 110/72  Pulse: 100  Temp: 98.8 F (37.1 C)  TempSrc: Oral  Resp: 16  Height: 5\' 2"  (1.575 m)  Weight: 153 lb (69.4 kg)  SpO2: 94%    Physical Exam  Constitutional: She is oriented to person, place, and time and well-developed, well-nourished, and in no distress.  HENT:  Head: Normocephalic and atraumatic.  Musculoskeletal:  Lumbar back: She exhibits tenderness, pain and spasm.       Back:  Neurological: She is alert and oriented to person, place, and time.  Psychiatric: Mood, memory, affect and judgment normal.  Nursing note and vitals reviewed.     Assessment & Plan  1. DDD (degenerative disc disease), lumbar Patient likely be on long term opioid therapy, new controlled substances agreement was reviewed and signed. Prescription for hydrocodone was provided, patient will follow-up in one month - HYDROcodone-acetaminophen (NORCO) 10-325 MG tablet; Take 1 tablet by mouth every 8 (eight) hours as needed.  Dispense: 90 tablet; Refill: 0  2. Adnexal cyst Likely benign based on radiographic criteria, referred to gynecology for excision. - Ambulatory referral to  Gynecology   [facility] 05/10/2016 8:59 AM

## 2016-05-15 ENCOUNTER — Ambulatory Visit: Payer: Medicare Other | Admitting: Family Medicine

## 2016-06-08 ENCOUNTER — Encounter: Payer: Self-pay | Admitting: Family Medicine

## 2016-06-08 ENCOUNTER — Ambulatory Visit (INDEPENDENT_AMBULATORY_CARE_PROVIDER_SITE_OTHER): Payer: Medicare Other | Admitting: Family Medicine

## 2016-06-08 DIAGNOSIS — M5136 Other intervertebral disc degeneration, lumbar region: Secondary | ICD-10-CM | POA: Diagnosis not present

## 2016-06-08 DIAGNOSIS — F329 Major depressive disorder, single episode, unspecified: Secondary | ICD-10-CM

## 2016-06-08 DIAGNOSIS — F419 Anxiety disorder, unspecified: Secondary | ICD-10-CM

## 2016-06-08 DIAGNOSIS — F418 Other specified anxiety disorders: Secondary | ICD-10-CM

## 2016-06-08 MED ORDER — CLONAZEPAM 1 MG PO TABS: 1.0000 mg | ORAL_TABLET | Freq: Three times a day (TID) | ORAL | 0 refills | Status: DC | PRN

## 2016-06-08 MED ORDER — HYDROCODONE-ACETAMINOPHEN 10-325 MG PO TABS: 1.0000 | ORAL_TABLET | Freq: Three times a day (TID) | ORAL | 0 refills | Status: DC | PRN

## 2016-06-08 NOTE — Progress Notes (Signed)
Name: Kim Buchanan   MRN: [id number]    DOB: 01/19/1951   Date:06/08/2016       Progress Note  Subjective  Chief Complaint  Chief Complaint  Patient presents with  . Medication Refill    HPI  Low Back Pain: Patient presents for follow up and medication refills. She has chronic low back pain, its 8/10 without the medication. With the medication, its about a 2/10. X ray of lumbar spine showed diffuse degenerative disc disease. She is taking the medication as prescribed, no side effects.   Anxiety:  Patient presents for medication refill and follow up, symptoms include anxiety, worrying, no panic attacks. She takes Clonazepam 1 mg three times daily as needed. This helps relieve her anxiety. Overall improved.   Past Medical History:  Diagnosis Date  . Anxiety   . Depression   . GERD (gastroesophageal reflux disease)   . Hyperlipidemia     Past Surgical History:  Procedure Laterality Date  . COLONOSCOPY  2014  . RECTAL SURGERY  06/28/2015   Rectal prolapse, laparoscopic rectopexy the coldCharmont, MD; [facility]    Family History  Problem Relation Age of Onset  . Diabetes Mother   . Diabetes Sister   . Diabetes Brother   . Lymphoma Brother     Social History   Social History  . Marital status: Married    Spouse name: N/A  . Number of children: N/A  . Years of education: N/A   Occupational History  . Not on file.   Social History Main Topics  . Smoking status: Current Every Day Smoker    Packs/day: 0.50    Types: Cigarettes  . Smokeless tobacco: Never Used  . Alcohol use No  . Drug use: No  . Sexual activity: Not on file   Other Topics Concern  . Not on file   Social History Narrative  . No narrative on file     Current Outpatient Prescriptions:  .  aspirin 81 MG tablet, Take 81 mg by mouth daily. , Disp: , Rfl:  .  clonazePAM (KLONOPIN) 1 MG tablet, Take 1 tablet (1 mg total) by mouth 3 (three) times daily as needed for anxiety., Disp: 90 tablet, Rfl:  0 .  HYDROcodone-acetaminophen (NORCO) 10-325 MG tablet, Take 1 tablet by mouth every 8 (eight) hours as needed., Disp: 90 tablet, Rfl: 0 .  omeprazole (PRILOSEC) 40 MG capsule, Take 1 capsule (40 mg total) by mouth daily., Disp: 90 capsule, Rfl: 1 .  Sennosides (LAXATIVE PILLS PO), Take 8.6 mg by mouth as needed., Disp: , Rfl:  .  venlafaxine XR (EFFEXOR-XR) 150 MG 24 hr capsule, Take 1 capsule (150 mg total) by mouth at bedtime., Disp: 90 capsule, Rfl: 1 .  venlafaxine XR (EFFEXOR-XR) 75 MG 24 hr capsule, Take 1 capsule (75 mg total) by mouth at bedtime., Disp: 90 capsule, Rfl: 1  No Known Allergies   Review of Systems  Musculoskeletal: Positive for back pain.  Psychiatric/Behavioral: Positive for depression. The patient is nervous/anxious.     Objective  Vitals:   06/08/16 1114  Pulse: (!) 105  Resp: 14  Temp: 98.7 F (37.1 C)  TempSrc: Oral  SpO2: 96%  Weight: 151 lb (68.5 kg)    Physical Exam  Constitutional: She is oriented to person, place, and time and well-developed, well-nourished, and in no distress.  HENT:  Head: Normocephalic and atraumatic.  Cardiovascular: Normal rate, regular rhythm and normal heart sounds.   No murmur heard. Pulmonary/Chest: Effort normal  and breath sounds normal. She has no wheezes.  Musculoskeletal:       Lumbar back: She exhibits tenderness, pain and spasm.       Back:  Neurological: She is alert and oriented to person, place, and time.  Psychiatric: Mood, memory, affect and judgment normal.  Nursing note and vitals reviewed.    Assessment & Plan  1. Anxiety and depression Stable, responsive to clonazepam, patient compliant with controlled substances agreement. - clonazePAM (KLONOPIN) 1 MG tablet; Take 1 tablet (1 mg total) by mouth 3 (three) times daily as needed for anxiety.  Dispense: 90 tablet; Refill: 0  2. DDD (degenerative disc disease), lumbar Stable, continue on hydrocodone 10 mg every 8 hours as needed. Patient  compliant with controlled substances agreement. Refills provided - HYDROcodone-acetaminophen (NORCO) 10-325 MG tablet; Take 1 tablet by mouth every 8 (eight) hours as needed.  Dispense: 90 tablet; Refill: 0   Diana Armijo Asad A. [facility] 06/08/2016 11:22 AM

## 2016-06-19 DIAGNOSIS — S92424A Nondisplaced fracture of distal phalanx of right great toe, initial encounter for closed fracture: Secondary | ICD-10-CM | POA: Diagnosis not present

## 2016-06-19 DIAGNOSIS — S99921A Unspecified injury of right foot, initial encounter: Secondary | ICD-10-CM | POA: Diagnosis not present

## 2016-06-27 ENCOUNTER — Ambulatory Visit (INDEPENDENT_AMBULATORY_CARE_PROVIDER_SITE_OTHER): Payer: Medicare Other | Admitting: Obstetrics and Gynecology

## 2016-06-27 ENCOUNTER — Encounter: Payer: Self-pay | Admitting: Obstetrics and Gynecology

## 2016-06-27 VITALS — BP 104/67 | HR 93 | Ht 62.0 in | Wt 157.2 lb

## 2016-06-27 DIAGNOSIS — N83201 Unspecified ovarian cyst, right side: Secondary | ICD-10-CM | POA: Diagnosis not present

## 2016-06-27 NOTE — Progress Notes (Signed)
GYN ENCOUNTER NOTE  Subjective:       Kim Buchanan is a 65 y.o. G57P100 female is here for gynecologic evaluation of the following issues:   1. Patient referred for follow up CT findings of right ovarian cyst. She currently is in no pain and denies daily pain. Really only notices pain when she pushes her RLQ. Denies any vaginal discharge or pain.  Has normal bowel movements. Has some difficulty starting a urine stream but completely empties, no other urinary complaints. No family history of ovarian or breast cancer. Denies sexual activity.   Gynecologic History No LMP recorded. Patient is postmenopausal. Contraception: Postmenopausal  Obstetric History OB History  Gravida Para Term Preterm AB Living  1 1 1     1   SAB TAB Ectopic Multiple Live Births          1    # Outcome Date GA Lbr Len/2nd Weight Sex Delivery Anes PTL Lv  1 Term 1971   8 lb (3.629 kg) M Vag-Spont   LIV      Past Medical History:  Diagnosis Date  . Anxiety   . Arthritis   . Depression   . GERD (gastroesophageal reflux disease)   . Hyperlipidemia   . Panic attack     Past Surgical History:  Procedure Laterality Date  . COLONOSCOPY  2014  . RECTAL SURGERY  06/28/2015   Rectal prolapse, laparoscopic rectopexy the coldCharmont, MD; [facility]    Current Outpatient Prescriptions on File Prior to Visit  Medication Sig Dispense Refill  . aspirin 81 MG tablet Take 81 mg by mouth daily.     . clonazePAM (KLONOPIN) 1 MG tablet Take 1 tablet (1 mg total) by mouth 3 (three) times daily as needed for anxiety. 90 tablet 0  . HYDROcodone-acetaminophen (NORCO) 10-325 MG tablet Take 1 tablet by mouth every 8 (eight) hours as needed. 90 tablet 0  . omeprazole (PRILOSEC) 40 MG capsule Take 1 capsule (40 mg total) by mouth daily. 90 capsule 1  . Sennosides (LAXATIVE PILLS PO) Take 8.6 mg by mouth as needed.    . venlafaxine XR (EFFEXOR-XR) 150 MG 24 hr capsule Take 1 capsule (150 mg total) by mouth at bedtime. 90 capsule 1   . venlafaxine XR (EFFEXOR-XR) 75 MG 24 hr capsule Take 1 capsule (75 mg total) by mouth at bedtime. 90 capsule 1   No current facility-administered medications on file prior to visit.     No Known Allergies  Social History   Social History  . Marital status: Married    Spouse name: N/A  . Number of children: N/A  . Years of education: N/A   Occupational History  . Not on file.   Social History Main Topics  . Smoking status: Current Every Day Smoker    Packs/day: 0.50    Types: Cigarettes  . Smokeless tobacco: Never Used  . Alcohol use No  . Drug use: No  . Sexual activity: Not Currently   Other Topics Concern  . Not on file   Social History Narrative  . No narrative on file    Family History  Problem Relation Age of Onset  . Diabetes Mother   . Diabetes Sister   . Diabetes Brother   . Lymphoma Brother   . Breast cancer Neg Hx   . Ovarian cancer Neg Hx   . Colon cancer Neg Hx     The following portions of the patient's history were reviewed and updated as appropriate: allergies,  current medications, past family history, past medical history, past social history, past surgical history and problem list.  Review of Systems Review of Systems - General ROS: negative for - chills, fatigue, fever, hot flashes, malaise or night sweats Hematological and Lymphatic ROS: negative for - bleeding problems or swollen lymph nodes Gastrointestinal ROS: negative for - abdominal pain, blood in stools, change in bowel habits and nausea/vomiting Musculoskeletal ROS: negative for - joint pain, muscle pain or muscular weakness Genito-Urinary ROS: negative for - change in menstrual cycle, dysmenorrhea, dyspareunia, dysuria, genital discharge, genital ulcers, hematuria, incontinence, irregular/heavy menses, nocturia or pelvic pain  Objective:   BP 104/67   Pulse 93   Ht 5\' 2"  (1.575 m)   Wt 157 lb 3.2 oz (71.3 kg)   BMI 28.75 kg/m  CONSTITUTIONAL: Well-developed, well-nourished  female in no acute distress.  Physical Exam Deffered.   Assessment:   1. Right ovarian cyst CT finding of a 1.7 cm right ovarian cyst causing some discomfort in the patient when she palpates RLQ. No family history of ovarian or breast cancer and started menopause early, lowering risk of cancer. Likely simple cyst.      Plan:   1.Pelvic ultrasound-1 week 2.Have patient follow up in office for results and physical exam-2 weeks.   Grayland Ormond PA-S Brayton Mars, MD  Note: This dictation was prepared with Dragon dictation along with smaller phrase technology. Any transcriptional errors that result from this process are unintentional.   I have seen, interviewed, and examined the patient in conjunction with the [place]. student and affirm the diagnosis and management plan. Martin A. DeFrancesco, MD, Cher[facility]nn June

## 2016-06-27 NOTE — Patient Instructions (Signed)
1. Ultrasound scheduled-1 week 2. Follow-up-2 weeks Ovarian Cyst An ovarian cyst is a fluid-filled sac that forms on an ovary. The ovaries are small organs that produce eggs in women. Various types of cysts can form on the ovaries. Most are not cancerous. Many do not cause problems, and they often go away on their own. Some may cause symptoms and require treatment. Common types of ovarian cysts include:  Functional cysts--These cysts may occur every month during the menstrual cycle. This is normal. The cysts usually go away with the next menstrual cycle if the woman does not get pregnant. Usually, there are no symptoms with a functional cyst.  Endometrioma cysts--These cysts form from the tissue that lines the uterus. They are also called "chocolate cysts" because they become filled with blood that turns brown. This type of cyst can cause pain in the lower abdomen during intercourse and with your menstrual period.  Cystadenoma cysts--This type develops from the cells on the outside of the ovary. These cysts can get very big and cause lower abdomen pain and pain with intercourse. This type of cyst can twist on itself, cut off its blood supply, and cause severe pain. It can also easily rupture and cause a lot of pain.  Dermoid cysts--This type of cyst is sometimes found in both ovaries. These cysts may contain different kinds of body tissue, such as skin, teeth, hair, or cartilage. They usually do not cause symptoms unless they get very big.  Theca lutein cysts--These cysts occur when too much of a certain hormone (human chorionic gonadotropin) is produced and overstimulates the ovaries to produce an egg. This is most common after procedures used to assist with the conception of a baby (in vitro fertilization). CAUSES   Fertility drugs can cause a condition in which multiple large cysts are formed on the ovaries. This is called ovarian hyperstimulation syndrome.  A condition called polycystic ovary  syndrome can cause hormonal imbalances that can lead to nonfunctional ovarian cysts. SIGNS AND SYMPTOMS  Many ovarian cysts do not cause symptoms. If symptoms are present, they may include:  Pelvic pain or pressure.  Pain in the lower abdomen.  Pain during sexual intercourse.  Increasing girth (swelling) of the abdomen.  Abnormal menstrual periods.  Increasing pain with menstrual periods.  Stopping having menstrual periods without being pregnant. DIAGNOSIS  These cysts are commonly found during a routine or annual pelvic exam. Tests may be ordered to find out more about the cyst. These tests may include:  Ultrasound.  X-ray of the pelvis.  CT scan.  MRI.  Blood tests. TREATMENT  Many ovarian cysts go away on their own without treatment. Your health care provider may want to check your cyst regularly for 2-3 months to see if it changes. For women in menopause, it is particularly important to monitor a cyst closely because of the higher rate of ovarian cancer in menopausal women. When treatment is needed, it may include any of the following:  A procedure to drain the cyst (aspiration). This may be done using a long needle and ultrasound. It can also be done through a laparoscopic procedure. This involves using a thin, lighted tube with a tiny camera on the end (laparoscope) inserted through a small incision.  Surgery to remove the whole cyst. This may be done using laparoscopic surgery or an open surgery involving a larger incision in the lower abdomen.  Hormone treatment or birth control pills. These methods are sometimes used to help dissolve a cyst. HOME  CARE INSTRUCTIONS   Only take over-the-counter or prescription medicines as directed by your health care provider.  Follow up with your health care provider as directed.  Get regular pelvic exams and Pap tests. SEEK MEDICAL CARE IF:   Your periods are late, irregular, or painful, or they stop.  Your pelvic pain or  abdominal pain does not go away.  Your abdomen becomes larger or swollen.  You have pressure on your bladder or trouble emptying your bladder completely.  You have pain during sexual intercourse.  You have feelings of fullness, pressure, or discomfort in your stomach.  You lose weight for no apparent reason.  You feel generally ill.  You become constipated.  You lose your appetite.  You develop acne.  You have an increase in body and facial hair.  You are gaining weight, without changing your exercise and eating habits.  You think you are pregnant. SEEK IMMEDIATE MEDICAL CARE IF:   You have increasing abdominal pain.  You feel sick to your stomach (nauseous), and you throw up (vomit).  You develop a fever that comes on suddenly.  You have abdominal pain during a bowel movement.  Your menstrual periods become heavier than usual. MAKE SURE YOU:  Understand these instructions.  Will watch your condition.  Will get help right away if you are not doing well or get worse.   This information is not intended to replace advice given to you by your health care provider. Make sure you discuss any questions you have with your health care provider.   Document Released: 10/08/2005 Document Revised: 10/13/2013 Document Reviewed: 06/15/2013 Elsevier Interactive Patient Education [employer].

## 2016-07-03 ENCOUNTER — Encounter: Payer: Self-pay | Admitting: Family Medicine

## 2016-07-03 ENCOUNTER — Ambulatory Visit (INDEPENDENT_AMBULATORY_CARE_PROVIDER_SITE_OTHER): Payer: Medicare Other | Admitting: Family Medicine

## 2016-07-03 DIAGNOSIS — F418 Other specified anxiety disorders: Secondary | ICD-10-CM | POA: Diagnosis not present

## 2016-07-03 DIAGNOSIS — S90221D Contusion of right lesser toe(s) with damage to nail, subsequent encounter: Secondary | ICD-10-CM

## 2016-07-03 DIAGNOSIS — M5136 Other intervertebral disc degeneration, lumbar region: Secondary | ICD-10-CM | POA: Diagnosis not present

## 2016-07-03 DIAGNOSIS — F329 Major depressive disorder, single episode, unspecified: Secondary | ICD-10-CM

## 2016-07-03 DIAGNOSIS — F32A Depression, unspecified: Secondary | ICD-10-CM

## 2016-07-03 DIAGNOSIS — F419 Anxiety disorder, unspecified: Secondary | ICD-10-CM

## 2016-07-03 MED ORDER — HYDROCODONE-ACETAMINOPHEN 10-325 MG PO TABS: 1.0000 | ORAL_TABLET | Freq: Three times a day (TID) | ORAL | 0 refills | Status: DC | PRN

## 2016-07-03 MED ORDER — CLONAZEPAM 1 MG PO TABS: 1.0000 mg | ORAL_TABLET | Freq: Three times a day (TID) | ORAL | 0 refills | Status: DC | PRN

## 2016-07-03 NOTE — Progress Notes (Signed)
Name: Kim Buchanan   MRN: [id number]    DOB: 09-[name]-1951   Date:07/03/2016       Progress Note  Subjective  Chief Complaint  Chief Complaint  Patient presents with  . Follow-up    1 mo  . Medication Refill    clonazepam / hydrocodone      Back Pain  This is a chronic problem. The current episode started more than 1 month ago (over 6 months ago). The problem has been gradually improving since onset. The pain is present in the lumbar spine. The pain does not radiate. The pain is at a severity of 2/10. The pain is severe. Pertinent negatives include no leg pain or tingling. She has tried analgesics (Opioid therapy.) for the symptoms.  Anxiety  Presents for follow-up visit. Symptoms include depressed mood, excessive worry, insomnia, irritability, nervous/anxious behavior and panic. The severity of symptoms is moderate. The quality of sleep is fair.      Past Medical History:  Diagnosis Date  . Anxiety   . Arthritis   . Depression   . GERD (gastroesophageal reflux disease)   . Hyperlipidemia   . Panic attack     Past Surgical History:  Procedure Laterality Date  . COLONOSCOPY  2014  . RECTAL SURGERY  06/28/2015   Rectal prolapse, laparoscopic rectopexy the coldCharmont, MD; [facility]    Family History  Problem Relation Age of Onset  . Diabetes Mother   . Diabetes Sister   . Diabetes Brother   . Lymphoma Brother   . Breast cancer Neg Hx   . Ovarian cancer Neg Hx   . Colon cancer Neg Hx     Social History   Social History  . Marital status: Married    Spouse name: N/A  . Number of children: N/A  . Years of education: N/A   Occupational History  . Not on file.   Social History Main Topics  . Smoking status: Current Every Day Smoker    Packs/day: 0.50    Types: Cigarettes  . Smokeless tobacco: Never Used  . Alcohol use No  . Drug use: No  . Sexual activity: Not Currently   Other Topics Concern  . Not on file   Social History Narrative  . No narrative on  file     Current Outpatient Prescriptions:  .  aspirin 81 MG tablet, Take 81 mg by mouth daily. , Disp: , Rfl:  .  clonazePAM (KLONOPIN) 1 MG tablet, Take 1 tablet (1 mg total) by mouth 3 (three) times daily as needed for anxiety., Disp: 90 tablet, Rfl: 0 .  HYDROcodone-acetaminophen (NORCO) 10-325 MG tablet, Take 1 tablet by mouth every 8 (eight) hours as needed., Disp: 90 tablet, Rfl: 0 .  omeprazole (PRILOSEC) 40 MG capsule, Take 1 capsule (40 mg total) by mouth daily., Disp: 90 capsule, Rfl: 1 .  Sennosides (LAXATIVE PILLS PO), Take 8.6 mg by mouth as needed., Disp: , Rfl:  .  venlafaxine XR (EFFEXOR-XR) 150 MG 24 hr capsule, Take 1 capsule (150 mg total) by mouth at bedtime., Disp: 90 capsule, Rfl: 1 .  venlafaxine XR (EFFEXOR-XR) 75 MG 24 hr capsule, Take 1 capsule (75 mg total) by mouth at bedtime., Disp: 90 capsule, Rfl: 1  No Known Allergies   Review of Systems  Constitutional: Positive for irritability.  Musculoskeletal: Positive for back pain.  Neurological: Negative for tingling.  Psychiatric/Behavioral: The patient is nervous/anxious and has insomnia.      Objective  Vitals:  07/03/16 1004  BP: 106/68  Pulse: (!) 102  Resp: 18  Temp: 98.9 F (37.2 C)  TempSrc: Oral  SpO2: 96%  Weight: 155 lb (70.3 kg)  Height: 5\' 2"  (1.575 m)    Physical Exam  Constitutional: She is well-developed, well-nourished, and in no distress.  HENT:  Head: Normocephalic and atraumatic.  Cardiovascular: Normal rate, regular rhythm, S1 normal, S2 normal and normal heart sounds.   No murmur heard. Pulmonary/Chest: Effort normal. No respiratory distress. She has no decreased breath sounds. She has no wheezes.  Musculoskeletal:       Lumbar back: She exhibits tenderness, pain and spasm.       Back:       Feet:  Right toenail contusion, nailbed with dried oxidized blood, normal ROM of right toe, mild tenderness to palpation, no surrounding erythema.   Psychiatric: Mood, memory,  affect and judgment normal.  Nursing note and vitals reviewed.    Assessment & Plan  1. Anxiety and depression Stable on present benzodiazepine therapy - clonazePAM (KLONOPIN) 1 MG tablet; Take 1 tablet (1 mg total) by mouth 3 (three) times daily as needed for anxiety.  Dispense: 90 tablet; Refill: 0  2. DDD (degenerative disc disease), lumbar Chronic low back pain, stable on opioid therapy - HYDROcodone-acetaminophen (NORCO) 10-325 MG tablet; Take 1 tablet by mouth every 8 (eight) hours as needed.  Dispense: 90 tablet; Refill: 0  3. Contusion of toenail of right foot, subsequent encounter Patient's seen by urgent care for traumatic injury to the right toenail, we will request records. Patient may need a referral to podiatry   Kim Buchanan [facility] 07/03/2016 10:27 AM

## 2016-07-04 ENCOUNTER — Ambulatory Visit (INDEPENDENT_AMBULATORY_CARE_PROVIDER_SITE_OTHER): Payer: Medicare Other

## 2016-07-04 DIAGNOSIS — N83201 Unspecified ovarian cyst, right side: Secondary | ICD-10-CM | POA: Diagnosis not present

## 2016-07-17 ENCOUNTER — Telehealth: Payer: Self-pay | Admitting: Obstetrics and Gynecology

## 2016-07-17 ENCOUNTER — Ambulatory Visit: Payer: Medicare Other | Admitting: Obstetrics and Gynecology

## 2016-07-17 NOTE — Telephone Encounter (Signed)
Pt aware. Cyst wnl. F/u if persistant pelvic pain. Pt also wanted mad to due her annuals. AE scheduled for 10/2016.

## 2016-07-17 NOTE — Telephone Encounter (Signed)
PT CALLED AND SHE HAD AN PPT TODAY FOR AN [place] F/U RESULTS, BUT SHE WOKE UP SICK AND HAD TO CANCEL, SO SHE WANTED TO KNOW IF THE RESULTS CAN BE CALLED IN TO HER OR DOES SHE NEED TO RESCHEDULE AN APPT.

## 2016-08-02 ENCOUNTER — Encounter: Payer: Self-pay | Admitting: Family Medicine

## 2016-08-02 ENCOUNTER — Ambulatory Visit (INDEPENDENT_AMBULATORY_CARE_PROVIDER_SITE_OTHER): Payer: Medicare Other | Admitting: Family Medicine

## 2016-08-02 VITALS — BP 108/71 | HR 93 | Temp 98.9°F | Resp 17 | Ht 62.0 in | Wt 154.3 lb

## 2016-08-02 DIAGNOSIS — F418 Other specified anxiety disorders: Secondary | ICD-10-CM | POA: Diagnosis not present

## 2016-08-02 DIAGNOSIS — Z23 Encounter for immunization: Secondary | ICD-10-CM

## 2016-08-02 DIAGNOSIS — F419 Anxiety disorder, unspecified: Secondary | ICD-10-CM

## 2016-08-02 DIAGNOSIS — E785 Hyperlipidemia, unspecified: Secondary | ICD-10-CM

## 2016-08-02 DIAGNOSIS — M5136 Other intervertebral disc degeneration, lumbar region: Secondary | ICD-10-CM

## 2016-08-02 DIAGNOSIS — F329 Major depressive disorder, single episode, unspecified: Secondary | ICD-10-CM

## 2016-08-02 MED ORDER — CLONAZEPAM 1 MG PO TABS: 1.0000 mg | ORAL_TABLET | Freq: Three times a day (TID) | ORAL | 0 refills | Status: DC | PRN

## 2016-08-02 MED ORDER — HYDROCODONE-ACETAMINOPHEN 10-325 MG PO TABS: 1.0000 | ORAL_TABLET | Freq: Three times a day (TID) | ORAL | 0 refills | Status: DC | PRN

## 2016-08-02 NOTE — Progress Notes (Signed)
Name: Kim Buchanan   MRN: [id number]    DOB: [birth year]   Date:08/02/2016       Progress Note  Subjective  Chief Complaint  Chief Complaint  Patient presents with  . Follow-up    1 mo  . Medication Refill    omperazole / hydrocodone     Pertinent negatives include no chills or fever.  Back Pain  This is a chronic problem. Episode onset: over 6 months ago. The problem has been gradually improving since onset. The pain is present in the lumbar spine. The pain does not radiate. The pain is at a severity of 2/10. The pain is severe. Pertinent negatives include no bladder incontinence, bowel incontinence, fever, leg pain or tingling. She has tried analgesics (Opioid therapy.) for the symptoms.  Anxiety  Presents for follow-up visit. Symptoms include depressed mood, excessive worry, insomnia, irritability, nervous/anxious behavior and panic. The severity of symptoms is moderate. The quality of sleep is fair.       Past Medical History:  Diagnosis Date  . Anxiety   . Arthritis   . Depression   . GERD (gastroesophageal reflux disease)   . Hyperlipidemia   . Panic attack     Past Surgical History:  Procedure Laterality Date  . COLONOSCOPY  2014  . RECTAL SURGERY  06/28/2015   Rectal prolapse, laparoscopic rectopexy the coldCharmont, MD; [facility]    Family History  Problem Relation Age of Onset  . Diabetes Mother   . Diabetes Sister   . Diabetes Brother   . Lymphoma Brother   . Breast cancer Neg Hx   . Ovarian cancer Neg Hx   . Colon cancer Neg Hx     Social History   Social History  . Marital status: Married    Spouse name: N/A  . Number of children: N/A  . Years of education: N/A   Occupational History  . Not on file.   Social History Main Topics  . Smoking status: Current Every Day Smoker    Packs/day: 0.50    Types: Cigarettes  . Smokeless tobacco: Never Used  . Alcohol use No  . Drug use: No  . Sexual activity: Not Currently   Other Topics Concern  .  Not on file   Social History Narrative  . No narrative on file     Current Outpatient Prescriptions:  .  aspirin 81 MG tablet, Take 81 mg by mouth daily. , Disp: , Rfl:  .  clonazePAM (KLONOPIN) 1 MG tablet, Take 1 tablet (1 mg total) by mouth 3 (three) times daily as needed for anxiety., Disp: 90 tablet, Rfl: 0 .  HYDROcodone-acetaminophen (NORCO) 10-325 MG tablet, Take 1 tablet by mouth every 8 (eight) hours as needed., Disp: 90 tablet, Rfl: 0 .  omeprazole (PRILOSEC) 40 MG capsule, Take 1 capsule (40 mg total) by mouth daily., Disp: 90 capsule, Rfl: 1 .  Sennosides (LAXATIVE PILLS PO), Take 8.6 mg by mouth as needed., Disp: , Rfl:  .  venlafaxine XR (EFFEXOR-XR) 150 MG 24 hr capsule, Take 1 capsule (150 mg total) by mouth at bedtime., Disp: 90 capsule, Rfl: 1 .  venlafaxine XR (EFFEXOR-XR) 75 MG 24 hr capsule, Take 1 capsule (75 mg total) by mouth at bedtime., Disp: 90 capsule, Rfl: 1  No Known Allergies   Review of Systems  Constitutional: Positive for irritability. Negative for chills, fever and malaise/fatigue.  Gastrointestinal: Negative for bowel incontinence.  Genitourinary: Negative for bladder incontinence.  Musculoskeletal: Positive for back pain.  Neurological: Negative for tingling.  Psychiatric/Behavioral: The patient is nervous/anxious and has insomnia.      Objective  Vitals:   08/02/16 0811  BP: 108/71  Pulse: 93  Resp: 17  Temp: 98.9 F (37.2 C)  TempSrc: Oral  SpO2: 96%  Weight: 154 lb 4.8 oz (70 kg)  Height: 5\' 2"  (1.575 m)    Physical Exam  Constitutional: She is oriented to person, place, and time and well-developed, well-nourished, and in no distress.  HENT:  Head: Normocephalic and atraumatic.  Cardiovascular: Normal rate, regular rhythm and normal heart sounds.   No murmur heard. Pulmonary/Chest: Effort normal and breath sounds normal. She has no wheezes.  Abdominal: Soft. Bowel sounds are normal. There is no tenderness.  Musculoskeletal:         Lumbar back: She exhibits tenderness, pain and spasm.       Back:  Neurological: She is alert and oriented to person, place, and time.  Nursing note and vitals reviewed.   No results found for this or any previous visit (from the past 2160 hour(s)).   Assessment & Plan  1. Need for immunization against influenza  - Flu Vaccine QUAD 36+ mos PF IM (Fluarix & Fluzone Quad PF)  2. Anxiety and depression Stable, responsive to clonazepam taken when needed, patient compliant with controlled substances agreement and understands the dependence potential, side effects and drug interactions of benzodiazepines. Refills provided and follow-up in one month - clonazePAM (KLONOPIN) 1 MG tablet; Take 1 tablet (1 mg total) by mouth 3 (three) times daily as needed for anxiety.  Dispense: 90 tablet; Refill: 0  3. DDD (degenerative disc disease), lumbar Stable and pain is responsive to opioid therapy, patient compliant with controlled substances agreement and understands the dependence potential, side effects and drug interactions of opioids. Refills provided and follow-up in one month - HYDROcodone-acetaminophen (NORCO) 10-325 MG tablet; Take 1 tablet by mouth every 8 (eight) hours as needed.  Dispense: 90 tablet; Refill: 0  4. Dyslipidemia  - Lipid Profile - COMPLETE METABOLIC PANEL WITH GFR   Tyshika Baldridge [facility] 08/02/2016 8:33 AM

## 2016-08-03 ENCOUNTER — Telehealth: Payer: Self-pay

## 2016-08-03 ENCOUNTER — Other Ambulatory Visit: Payer: Self-pay

## 2016-08-03 MED ORDER — PEG 3350-KCL-NABCB-NACL-NASULF 236 G PO SOLR: ORAL | 0 refills | Status: DC

## 2016-08-03 NOTE — Telephone Encounter (Signed)
Gastroenterology Pre-Procedure Review  Request Date: 08/09/16  Requesting Physician: Dr. Keith Rake  PATIENT REVIEW QUESTIONS: The patient responded to the following health history questions as indicated:    1. Are you having any GI issues? no 2. Do you have a personal history of Polyps? yes (repeat every 3 years) 3. Do you have a family history of Colon Cancer or Polyps? no 4. Diabetes Mellitus? no 5. Joint replacements in the past 12 months?no 6. Major health problems in the past 3 months?no 7. Any artificial heart valves, MVP, or defibrillator?no    MEDICATIONS & ALLERGIES:    Patient reports the following regarding taking any anticoagulation/antiplatelet therapy:   Plavix, Coumadin, Eliquis, Xarelto, Lovenox, Pradaxa, Brilinta, or Effient? no Aspirin? yes (ASA 81mg )  Patient confirms/reports the following medications:  Current Outpatient Prescriptions  Medication Sig Dispense Refill  . aspirin 81 MG tablet Take 81 mg by mouth daily.     . clonazePAM (KLONOPIN) 1 MG tablet Take 1 tablet (1 mg total) by mouth 3 (three) times daily as needed for anxiety. 90 tablet 0  . HYDROcodone-acetaminophen (NORCO) 10-325 MG tablet Take 1 tablet by mouth every 8 (eight) hours as needed. 90 tablet 0  . omeprazole (PRILOSEC) 40 MG capsule Take 1 capsule (40 mg total) by mouth daily. 90 capsule 1  . Sennosides (LAXATIVE PILLS PO) Take 8.6 mg by mouth as needed.    . venlafaxine XR (EFFEXOR-XR) 150 MG 24 hr capsule Take 1 capsule (150 mg total) by mouth at bedtime. 90 capsule 1  . venlafaxine XR (EFFEXOR-XR) 75 MG 24 hr capsule Take 1 capsule (75 mg total) by mouth at bedtime. 90 capsule 1   No current facility-administered medications for this visit.     Patient confirms/reports the following allergies:  No Known Allergies  No orders of the defined types were placed in this encounter.   AUTHORIZATION INFORMATION Primary Insurance: 1D#: Group #:  Secondary Insurance: 1D#: Group  #:  SCHEDULE INFORMATION: Date:  Time: Location:

## 2016-08-03 NOTE — Telephone Encounter (Signed)
Pt scheduled for colonoscopy at [facility] on 08/09/16. Instructs/rx mailed. Please precert for Hx of colon polyps Z86.010.

## 2016-08-06 ENCOUNTER — Encounter: Payer: Self-pay | Admitting: Anesthesiology

## 2016-08-06 ENCOUNTER — Telehealth: Payer: Self-pay | Admitting: Family Medicine

## 2016-08-06 ENCOUNTER — Encounter: Payer: Self-pay | Admitting: *Deleted

## 2016-08-06 DIAGNOSIS — E785 Hyperlipidemia, unspecified: Secondary | ICD-10-CM | POA: Diagnosis not present

## 2016-08-06 LAB — COMPLETE METABOLIC PANEL WITH GFR
ALT: 11 U/L (ref 6–29)
AST: 13 U/L (ref 10–35)
Albumin: 4 g/dL (ref 3.6–5.1)
Alkaline Phosphatase: 73 U/L (ref 33–130)
BUN: 16 mg/dL (ref 7–25)
CO2: 26 mmol/L (ref 20–31)
Calcium: 8.9 mg/dL (ref 8.6–10.4)
Chloride: 104 mmol/L (ref 98–110)
Creat: 0.75 mg/dL (ref 0.50–0.99)
GFR, Est African American: >89 mL/min (ref >=60)
GFR, Est Non African American: 85 mL/min (ref >=60)
Glucose, Bld: 85 mg/dL (ref 65–99)
Potassium: 4.3 mmol/L (ref 3.5–5.3)
Sodium: 140 mmol/L (ref 135–146)
Total Bilirubin: 0.3 mg/dL (ref 0.2–1.2)
Total Protein: 6.7 g/dL (ref 6.1–8.1)

## 2016-08-06 LAB — LIPID PANEL
Cholesterol: 100 mg/dL — ABNORMAL LOW (ref 125–200)
HDL: 52 mg/dL (ref >=46)
LDL Cholesterol: 19 mg/dL (ref <130)
Total CHOL/HDL Ratio: 1.9 Ratio (ref <=5.0)
Triglycerides: 145 mg/dL (ref <150)
VLDL: 29 mg/dL (ref <30)

## 2016-08-06 NOTE — Telephone Encounter (Signed)
Called Pt to schedule AWV with [facility] - knb °

## 2016-08-06 NOTE — Telephone Encounter (Signed)
Confirm Notification/Prior Authorization  Thank you for your online Notification/Prior Authorization submission  The notification/prior authorization case information was transmitted on 08/06/2016 at 08:25 AM CDT. The notification/prior authorization reference number is [id number].   This approval does not guarantee that the plan will pay for the service(s). Please remember the following: A member must be eligible at the time of service(s) in order for the plan to cover the service(s).  If the member leaves the plan or the plan ends and service(s) are still rendered, the plan may not cover the service(s)  Payment of covered service(s) depends on other plan rules, including coordination of benefits.  Services that were never rendered or were a result of fraud, waste, or abuse may not be paid for by the plan.  Services provided by a provider that was sanctioned or excluded from [employer]  at the time services were provided who is not eligible for claim payment may not be paid for by the plan.  The reference number above acknowledges receipt of your notification or prior authorization request. Please write this number down and refer to it for future inquiries. Coverage and payment for an item or service is governed by the member's benefit plan document, and, if applicable, the provider's participation agreement with the Health Plan.

## 2016-08-07 NOTE — Discharge Instructions (Signed)

## 2016-08-09 ENCOUNTER — Other Ambulatory Visit: Payer: Self-pay

## 2016-08-09 ENCOUNTER — Ambulatory Visit: Admission: RE | Admit: 2016-08-09 | Payer: Medicare Other | Source: Ambulatory Visit | Admitting: Gastroenterology

## 2016-08-09 DIAGNOSIS — K603 Anal fistula: Secondary | ICD-10-CM | POA: Diagnosis not present

## 2016-08-09 HISTORY — DX: Rectal fistula, unspecified: K60.40

## 2016-08-09 HISTORY — DX: Endocarditis, valve unspecified: I38

## 2016-08-09 HISTORY — DX: Rectal fistula: K60.4

## 2016-08-09 HISTORY — DX: Presence of dental prosthetic device (complete) (partial): Z97.2

## 2016-08-09 SURGERY — COLONOSCOPY WITH PROPOFOL
Anesthesia: Choice

## 2016-08-17 ENCOUNTER — Encounter: Payer: Self-pay | Admitting: *Deleted

## 2016-08-21 ENCOUNTER — Encounter: Payer: Self-pay | Admitting: Anesthesiology

## 2016-08-22 NOTE — Discharge Instructions (Signed)

## 2016-08-23 ENCOUNTER — Other Ambulatory Visit: Payer: Self-pay

## 2016-08-23 DIAGNOSIS — Z1211 Encounter for screening for malignant neoplasm of colon: Secondary | ICD-10-CM

## 2016-08-23 MED ORDER — NA SULFATE-K SULFATE-MG SULF 17.5-3.13-1.6 GM/177ML PO SOLN: 1.0000 | Freq: Once | ORAL | 0 refills | Status: AC

## 2016-08-31 ENCOUNTER — Encounter: Payer: Self-pay | Admitting: Family Medicine

## 2016-08-31 ENCOUNTER — Ambulatory Visit (INDEPENDENT_AMBULATORY_CARE_PROVIDER_SITE_OTHER): Payer: Medicare Other | Admitting: Family Medicine

## 2016-08-31 DIAGNOSIS — M5136 Other intervertebral disc degeneration, lumbar region: Secondary | ICD-10-CM | POA: Diagnosis not present

## 2016-08-31 DIAGNOSIS — R39198 Other difficulties with micturition: Secondary | ICD-10-CM | POA: Diagnosis not present

## 2016-08-31 DIAGNOSIS — F418 Other specified anxiety disorders: Secondary | ICD-10-CM | POA: Diagnosis not present

## 2016-08-31 DIAGNOSIS — F419 Anxiety disorder, unspecified: Secondary | ICD-10-CM

## 2016-08-31 DIAGNOSIS — F329 Major depressive disorder, single episode, unspecified: Secondary | ICD-10-CM

## 2016-08-31 MED ORDER — HYDROCODONE-ACETAMINOPHEN 10-325 MG PO TABS: 1.0000 | ORAL_TABLET | Freq: Three times a day (TID) | ORAL | 0 refills | Status: DC | PRN

## 2016-08-31 MED ORDER — VENLAFAXINE HCL ER 150 MG PO CP24: 150.0000 mg | ORAL_CAPSULE | Freq: Every day | ORAL | 1 refills | Status: DC

## 2016-08-31 MED ORDER — CLONAZEPAM 1 MG PO TABS: 1.0000 mg | ORAL_TABLET | Freq: Three times a day (TID) | ORAL | 0 refills | Status: DC | PRN

## 2016-08-31 MED ORDER — VENLAFAXINE HCL ER 75 MG PO CP24
75.0000 mg | ORAL_CAPSULE | Freq: Every day | ORAL | 1 refills | Status: DC
Start: 2016-08-31 — End: 2017-02-22

## 2016-08-31 NOTE — Progress Notes (Signed)
Name: Kim Buchanan   MRN: [id number]    DOB: [birth year]   Date:08/31/2016       Progress Note  Subjective  Chief Complaint  Chief Complaint  Patient presents with  . Follow-up    1 mo  . Medication Refill  . Referral    urology    Anxiety  Presents for follow-up visit. Symptoms include excessive worry, insomnia, malaise and nervous/anxious behavior. Patient reports no decreased concentration. The severity of symptoms is moderate and causing significant distress.    Depression         This is a chronic problem.The problem is unchanged.  Associated symptoms include fatigue, insomnia, decreased interest and sad.  Associated symptoms include no decreased concentration.  Past treatments include SNRIs - Serotonin and norepinephrine reuptake inhibitors.  Past medical history includes chronic pain and anxiety.   Back Pain  This is a chronic problem. The problem is unchanged. The pain is present in the lumbar spine. The quality of the pain is described as aching. The pain is at a severity of 2/10. The symptoms are aggravated by bending (aggravated by house work). Pertinent negatives include no bladder incontinence or bowel incontinence. She has tried analgesics for the symptoms.   Referral to Urology: Pt. Presents to obtain referral to urology, states she is 'having problems with my bladder', she feels the urge to urinate but cannot as she used to, belives it started when 'they went in there and tacked my rectum up'. Pt. Was seen at [facility] for rectal prolapse based on review of records.    Past Medical History:  Diagnosis Date  . Anxiety   . Arthritis    lower back  . Depression   . GERD (gastroesophageal reflux disease)   . Hyperlipidemia   . Leaky heart valve    sees Dr Humphrey Rolls  . Panic attack   . Rectal fistula   . Wears dentures    full upper    Past Surgical History:  Procedure Laterality Date  . COLONOSCOPY  2014  . RECTAL SURGERY  06/28/2015   Rectal prolapse,  laparoscopic rectopexy the coldCharmont, MD; [facility]    Family History  Problem Relation Age of Onset  . Diabetes Mother   . Diabetes Sister   . Diabetes Brother   . Lymphoma Brother   . Breast cancer Neg Hx   . Ovarian cancer Neg Hx   . Colon cancer Neg Hx     Social History   Social History  . Marital status: Married    Spouse name: N/A  . Number of children: N/A  . Years of education: N/A   Occupational History  . Not on file.   Social History Main Topics  . Smoking status: Current Every Day Smoker    Packs/day: 0.50    Years: 45.00    Types: Cigarettes  . Smokeless tobacco: Never Used  . Alcohol use No  . Drug use: No  . Sexual activity: Not Currently   Other Topics Concern  . Not on file   Social History Narrative  . No narrative on file     Current Outpatient Prescriptions:  .  aspirin 81 MG tablet, Take 81 mg by mouth daily. , Disp: , Rfl:  .  clonazePAM (KLONOPIN) 1 MG tablet, Take 1 tablet (1 mg total) by mouth 3 (three) times daily as needed for anxiety., Disp: 90 tablet, Rfl: 0 .  HYDROcodone-acetaminophen (NORCO) 10-325 MG tablet, Take 1 tablet by mouth every 8 (  eight) hours as needed., Disp: 90 tablet, Rfl: 0 .  omeprazole (PRILOSEC) 40 MG capsule, Take 1 capsule (40 mg total) by mouth daily., Disp: 90 capsule, Rfl: 1 .  polyethylene glycol (GOLYTELY) 236 g solution, Drink one 8 oz glass every 20 mins until stools are clear., Disp: 4000 mL, Rfl: 0 .  Sennosides (LAXATIVE PILLS PO), Take 8.6 mg by mouth as needed., Disp: , Rfl:  .  venlafaxine XR (EFFEXOR-XR) 150 MG 24 hr capsule, Take 1 capsule (150 mg total) by mouth at bedtime., Disp: 90 capsule, Rfl: 1 .  venlafaxine XR (EFFEXOR-XR) 75 MG 24 hr capsule, Take 1 capsule (75 mg total) by mouth at bedtime., Disp: 90 capsule, Rfl: 1  No Known Allergies   Review of Systems  Constitutional: Positive for fatigue.  Gastrointestinal: Negative for bowel incontinence.  Genitourinary: Negative for bladder  incontinence.  Musculoskeletal: Positive for back pain.  Psychiatric/Behavioral: Positive for depression. Negative for decreased concentration. The patient is nervous/anxious and has insomnia.      Objective  Vitals:   08/31/16 1004  BP: 110/65  Pulse: (!) 113  Resp: 18  Temp: 98.5 F (36.9 C)  TempSrc: Oral  SpO2: 97%  Weight: 159 lb (72.1 kg)  Height: 5\' 2"  (1.575 m)    Physical Exam  Constitutional: She is oriented to person, place, and time and well-developed, well-nourished, and in no distress.  HENT:  Head: Normocephalic and atraumatic.  Cardiovascular: Normal rate, regular rhythm and normal heart sounds.   No murmur heard. Pulmonary/Chest: Effort normal and breath sounds normal. She has no wheezes.  Abdominal: Soft. Bowel sounds are normal. There is no tenderness.  Musculoskeletal:       Lumbar back: She exhibits tenderness, pain and spasm.       Back:  Neurological: She is oriented to person, place, and time.  Psychiatric: Mood, memory, affect and judgment normal.  Nursing note and vitals reviewed.    Assessment & Plan  1. Anxiety and depression We will continue to take clonazepam 1 mg 3 times a day when necessary, Effexor 225 mg at bedtime. Refills provided - venlafaxine XR (EFFEXOR-XR) 75 MG 24 hr capsule; Take 1 capsule (75 mg total) by mouth at bedtime.  Dispense: 90 capsule; Refill: 1 - venlafaxine XR (EFFEXOR-XR) 150 MG 24 hr capsule; Take 1 capsule (150 mg total) by mouth at bedtime.  Dispense: 90 capsule; Refill: 1 - clonazePAM (KLONOPIN) 1 MG tablet; Take 1 tablet (1 mg total) by mouth 3 (three) times daily as needed for anxiety.  Dispense: 90 tablet; Refill: 0  2. DDD (degenerative disc disease), lumbar Stable and responsive to hydrocodone 10 mg taken every 8 hours when needed, patient compliant with controlled substances agreement and understands the dependence potential, side effects and drug interactions of opioids. Refills provided and follow-up  in one month - HYDROcodone-acetaminophen (NORCO) 10-325 MG tablet; Take 1 tablet by mouth every 8 (eight) hours as needed.  Dispense: 90 tablet; Refill: 0  3. Difficulty urinating Urology for management, obtain urinalysis and culture to rule out UTI. - Ambulatory referral to Urology - Urinalysis, Routine w reflex microscopic - Urine Culture   Lyndie Vanderloop [facility] 08/31/2016 10:17 AM

## 2016-09-01 LAB — URINALYSIS, ROUTINE W REFLEX MICROSCOPIC
Bilirubin Urine: NEGATIVE
Glucose, UA: NEGATIVE
Hgb urine dipstick: NEGATIVE
Ketones, ur: NEGATIVE
Leukocytes, UA: NEGATIVE
Nitrite: NEGATIVE
Protein, ur: NEGATIVE
Specific Gravity, Urine: 1.018 (ref 1.001–1.035)
pH: 5 (ref 5.0–8.0)

## 2016-09-02 LAB — URINE CULTURE: Organism ID, Bacteria: NO GROWTH

## 2016-09-05 ENCOUNTER — Ambulatory Visit (INDEPENDENT_AMBULATORY_CARE_PROVIDER_SITE_OTHER): Payer: Medicare Other | Admitting: Urology

## 2016-09-05 ENCOUNTER — Encounter: Payer: Self-pay | Admitting: *Deleted

## 2016-09-05 ENCOUNTER — Encounter: Payer: Self-pay | Admitting: Urology

## 2016-09-05 ENCOUNTER — Telehealth: Payer: Self-pay

## 2016-09-05 VITALS — BP 123/79 | HR 90 | Ht 62.0 in | Wt 162.8 lb

## 2016-09-05 DIAGNOSIS — R39198 Other difficulties with micturition: Secondary | ICD-10-CM

## 2016-09-05 LAB — BLADDER SCAN AMB NON-IMAGING: Scan Result: 61

## 2016-09-05 MED ORDER — TAMSULOSIN HCL 0.4 MG PO CAPS: 0.4000 mg | ORAL_CAPSULE | Freq: Every day | ORAL | 11 refills | Status: DC

## 2016-09-05 NOTE — Progress Notes (Signed)
09/05/2016 2:02 PM   MELVIA NOONER Aug 18, 1951 [id number]  Referring provider: Roselee Nova, MD [address] [place], [place] [address]  Chief Complaint  Patient presents with  . New Patient (Initial Visit)    difficulty urinating     HPI: The patient is a 65 year old female with recent laparoscopic rectopexy in 2015 for rectal prolapse presents today with difficulty with urination since this procedure.  Her biggest complaint is this time is having to strain to urinate. She often will have to sit on the toilet and rocked back and forth to get her urinary stream started. She also tries running water which sometimes helps. She feels that she empties her bladder. She has a good stream when she gets started. She has no dysuria. She has no history of UTIs. This all started after her rectal prolapse procedure. She denies incontinence and urgency. Her PVR today is 61 cc.   PMH: Past Medical History:  Diagnosis Date  . Anxiety   . Arthritis    lower back  . Depression   . GERD (gastroesophageal reflux disease)   . Hyperlipidemia   . Leaky heart valve    sees Dr Humphrey Rolls  . Panic attack   . Rectal fistula   . Wears dentures    full upper    Surgical History: Past Surgical History:  Procedure Laterality Date  . COLONOSCOPY  2014  . RECTAL SURGERY  06/28/2015   Rectal prolapse, laparoscopic rectopexy the coldCharmont, MD; [facility]    Home Medications:    Medication List       Accurate as of 09/05/16  2:02 PM. Always use your most recent med list.          aspirin 81 MG tablet Take 81 mg by mouth daily.   clonazePAM 1 MG tablet Commonly known as:  KLONOPIN Take 1 tablet (1 mg total) by mouth 3 (three) times daily as needed for anxiety.   HYDROcodone-acetaminophen 10-325 MG tablet Commonly known as:  NORCO Take 1 tablet by mouth every 8 (eight) hours as needed.   LAXATIVE PILLS PO Take 8.6 mg by mouth as needed.   omeprazole 40 MG capsule Commonly  known as:  PRILOSEC Take 1 capsule (40 mg total) by mouth daily.   polyethylene glycol 236 g solution Commonly known as:  GOLYTELY Drink one 8 oz glass every 20 mins until stools are clear.   tamsulosin 0.4 MG Caps capsule Commonly known as:  FLOMAX Take 1 capsule (0.4 mg total) by mouth daily.   venlafaxine XR 75 MG 24 hr capsule Commonly known as:  EFFEXOR-XR Take 1 capsule (75 mg total) by mouth at bedtime.   venlafaxine XR 150 MG 24 hr capsule Commonly known as:  EFFEXOR-XR Take 1 capsule (150 mg total) by mouth at bedtime.       Allergies: No Known Allergies  Family History: Family History  Problem Relation Age of Onset  . Diabetes Mother   . Diabetes Sister   . Diabetes Brother   . Lymphoma Brother   . Breast cancer Neg Hx   . Ovarian cancer Neg Hx   . Colon cancer Neg Hx     Social History:  reports that she has been smoking Cigarettes.  She has a 22.50 pack-year smoking history. She has never used smokeless tobacco. She reports that she does not drink alcohol or use drugs.  ROS: UROLOGY Frequent Urination?: No Hard to postpone urination?: Yes Burning/pain with urination?: Yes Get up  at night to urinate?: No Leakage of urine?: No Urine stream starts and stops?: No Trouble starting stream?: Yes Do you have to strain to urinate?: Yes Blood in urine?: No Urinary tract infection?: No Sexually transmitted disease?: No Injury to kidneys or bladder?: No Painful intercourse?: No Weak stream?: Yes Currently pregnant?: No Vaginal bleeding?: No Last menstrual period?: n  Gastrointestinal Nausea?: No Vomiting?: No Indigestion/heartburn?: No Diarrhea?: No Constipation?: No  Constitutional Fever: No Night sweats?: No Weight loss?: No Fatigue?: No  Skin Skin rash/lesions?: No Itching?: No  Eyes Blurred vision?: No Double vision?: No  Ears/Nose/Throat Sore throat?: No Sinus problems?: No  Hematologic/Lymphatic Swollen glands?: No Easy  bruising?: No  Cardiovascular Leg swelling?: No Chest pain?: No  Respiratory Cough?: No Shortness of breath?: No  Endocrine Excessive thirst?: No  Musculoskeletal Back pain?: No Joint pain?: No  Neurological Headaches?: No Dizziness?: No  Psychologic Depression?: No Anxiety?: No  Physical Exam: BP 123/79   Pulse 90   Ht 5\' 2"  (1.575 m)   Wt 162 lb 12.8 oz (73.8 kg)   BMI 29.78 kg/m   Constitutional:  Alert and oriented, No acute distress. HEENT: [place] AT, moist mucus membranes.  Trachea midline, no masses. Cardiovascular: No clubbing, cyanosis, or edema. Respiratory: Normal respiratory effort, no increased work of breathing. GI: Abdomen is soft, nontender, nondistended, no abdominal masses GU: No CVA tenderness. On vaginal exam, there is no cystocele. Her bladder is stable with Valsalva. No incontinence noted. No urethral hypermobility. Skin: No rashes, bruises or suspicious lesions. Lymph: No cervical or inguinal adenopathy. Neurologic: Grossly intact, no focal deficits, moving all 4 extremities. Psychiatric: Normal mood and affect.  Laboratory Data: Lab Results  Component Value Date   WBC 7.6 03/22/2016   HGB 14.0 02/24/2013   HCT 38.8 03/22/2016   MCV 94 03/22/2016   PLT 233 03/22/2016    Lab Results  Component Value Date   CREATININE 0.75 08/06/2016    No results found for: PSA  No results found for: TESTOSTERONE  No results found for: HGBA1C  Urinalysis    Component Value Date/Time   COLORURINE YELLOW 08/31/2016 [place] 08/31/2016 1029   APPEARANCEUR Clear 03/22/2016 0924   LABSPEC 1.018 08/31/2016 1029   LABSPEC 1.018 01/28/2013 1516   PHURINE 5.0 08/31/2016 1029   GLUCOSEU NEGATIVE 08/31/2016 1029   GLUCOSEU 50 mg/dL 01/28/2013 1516   HGBUR NEGATIVE 08/31/2016 [place] 08/31/2016 1029   BILIRUBINUR Negative 03/22/2016 0924   BILIRUBINUR Negative 01/28/2013 1516   KETONESUR NEGATIVE 08/31/2016 1029     PROTEINUR NEGATIVE 08/31/2016 1029   NITRITE NEGATIVE 08/31/2016 1029   LEUKOCYTESUR NEGATIVE 08/31/2016 1029   LEUKOCYTESUR 2+ (A) 03/22/2016 0924   LEUKOCYTESUR Trace 01/28/2013 1516     Assessment & Plan:     1.  DSD I discussed with the patient likely has detrusor sphincter dyssynergia given her symptoms and normal pelvic exam. We discussed that Flomax can help with these symptoms. I will start her on this medication. She is warm the risk of orthostatic hypotension. She'll follow-up in 3 months. If she continues to have symptoms despite a trial of Flomax, we will order urodynamic studies at that time.    Return in about 3 months (around 12/06/2016).  Nickie Retort, MD  [facility] [address] [place], [place] [address] [phone]

## 2016-09-05 NOTE — Telephone Encounter (Signed)
The pt called complaining that she is having some bladder prolapse when she go to urinate. She states she notices these symptoms after having her rectocele  Surgery 2 years ago.

## 2016-09-07 ENCOUNTER — Telehealth: Payer: Self-pay | Admitting: Gastroenterology

## 2016-09-07 ENCOUNTER — Other Ambulatory Visit: Payer: Self-pay

## 2016-09-07 NOTE — Telephone Encounter (Signed)
Pt's colonoscopy has been moved to Dec 11th.

## 2016-09-07 NOTE — Telephone Encounter (Signed)
Kim Buchanan has called from [facility] and stated that patient has called and canceled her Colonoscopy. She would like to reschedule in December.

## 2016-09-10 ENCOUNTER — Ambulatory Visit: Admission: RE | Admit: 2016-09-10 | Payer: Medicare Other | Source: Ambulatory Visit | Admitting: Gastroenterology

## 2016-09-10 SURGERY — COLONOSCOPY WITH PROPOFOL
Anesthesia: Choice

## 2016-09-26 ENCOUNTER — Encounter: Payer: Self-pay | Admitting: *Deleted

## 2016-09-26 ENCOUNTER — Other Ambulatory Visit: Payer: Self-pay

## 2016-09-26 ENCOUNTER — Telehealth: Payer: Self-pay | Admitting: Gastroenterology

## 2016-09-26 DIAGNOSIS — Z1211 Encounter for screening for malignant neoplasm of colon: Secondary | ICD-10-CM

## 2016-09-26 MED ORDER — PEG 3350-KCL-NA BICARB-NACL 420 G PO SOLR: ORAL | 0 refills | Status: DC

## 2016-09-26 NOTE — Telephone Encounter (Signed)
New rx called into pt's pharmacy. Pt aware.

## 2016-09-26 NOTE — Telephone Encounter (Signed)
[place] in [place] another prep called in. Suprep to expensive. Colonoscopy is Monday and she would like to talk to you also.

## 2016-09-27 ENCOUNTER — Ambulatory Visit (INDEPENDENT_AMBULATORY_CARE_PROVIDER_SITE_OTHER): Payer: Medicare Other | Admitting: Family Medicine

## 2016-09-27 ENCOUNTER — Encounter: Payer: Self-pay | Admitting: Family Medicine

## 2016-09-27 DIAGNOSIS — M5136 Other intervertebral disc degeneration, lumbar region: Secondary | ICD-10-CM

## 2016-09-27 MED ORDER — HYDROCODONE-ACETAMINOPHEN 10-325 MG PO TABS: 1.0000 | ORAL_TABLET | Freq: Three times a day (TID) | ORAL | 0 refills | Status: DC | PRN

## 2016-09-27 NOTE — Progress Notes (Signed)
Name: Kim Buchanan   MRN: [id number]    DOB: September 10, 1951   Date:09/27/2016       Progress Note  Subjective  Chief Complaint  Chief Complaint  Patient presents with  . Follow-up    medication refills    Back Pain  This is a chronic problem. The current episode started more than 1 month ago (over 6 months ago). The problem has been gradually improving since onset. The pain is present in the lumbar spine. The pain does not radiate. The pain is at a severity of 2/10. The pain is moderate. The symptoms are aggravated by position (worse with vacuuming her floors). Pertinent negatives include no leg pain, numbness or tingling. She has tried analgesics (Opioid therapy.) for the symptoms.      Past Medical History:  Diagnosis Date  . Anxiety   . Arthritis    lower back  . Depression   . GERD (gastroesophageal reflux disease)   . Hyperlipidemia   . Leaky heart valve    sees Dr Humphrey Rolls  . Panic attack   . Rectal fistula   . Wears dentures    full upper    Past Surgical History:  Procedure Laterality Date  . COLONOSCOPY  2014  . RECTAL SURGERY  06/28/2015   Rectal prolapse, laparoscopic rectopexy the coldCharmont, MD; [facility]    Family History  Problem Relation Age of Onset  . Diabetes Mother   . Diabetes Sister   . Diabetes Brother   . Lymphoma Brother   . Breast cancer Neg Hx   . Ovarian cancer Neg Hx   . Colon cancer Neg Hx     Social History   Social History  . Marital status: Married    Spouse name: N/A  . Number of children: N/A  . Years of education: N/A   Occupational History  . Not on file.   Social History Main Topics  . Smoking status: Current Every Day Smoker    Packs/day: 0.50    Years: 45.00    Types: Cigarettes  . Smokeless tobacco: Never Used  . Alcohol use No  . Drug use: No  . Sexual activity: Not Currently   Other Topics Concern  . Not on file   Social History Narrative  . No narrative on file     Current Outpatient Prescriptions:  .   aspirin 81 MG tablet, Take 81 mg by mouth daily. , Disp: , Rfl:  .  clonazePAM (KLONOPIN) 1 MG tablet, Take 1 tablet (1 mg total) by mouth 3 (three) times daily as needed for anxiety., Disp: 90 tablet, Rfl: 0 .  HYDROcodone-acetaminophen (NORCO) 10-325 MG tablet, Take 1 tablet by mouth every 8 (eight) hours as needed. (Patient not taking: Reported on 09/05/2016), Disp: 90 tablet, Rfl: 0 .  omeprazole (PRILOSEC) 40 MG capsule, Take 1 capsule (40 mg total) by mouth daily., Disp: 90 capsule, Rfl: 1 .  polyethylene glycol (GOLYTELY) 236 g solution, Drink one 8 oz glass every 20 mins until stools are clear., Disp: 4000 mL, Rfl: 0 .  polyethylene glycol-electrolytes (TRILYTE) 420 g solution, Drink one 8 oz glass every 20 mins until stools are clear., Disp: 4000 mL, Rfl: 0 .  Sennosides (LAXATIVE PILLS PO), Take 8.6 mg by mouth as needed., Disp: , Rfl:  .  tamsulosin (FLOMAX) 0.4 MG CAPS capsule, Take 1 capsule (0.4 mg total) by mouth daily., Disp: 30 capsule, Rfl: 11 .  venlafaxine XR (EFFEXOR-XR) 150 MG 24 hr capsule, Take 1 capsule (  150 mg total) by mouth at bedtime., Disp: 90 capsule, Rfl: 1 .  venlafaxine XR (EFFEXOR-XR) 75 MG 24 hr capsule, Take 1 capsule (75 mg total) by mouth at bedtime., Disp: 90 capsule, Rfl: 1  No Known Allergies   Review of Systems  Musculoskeletal: Positive for back pain.  Neurological: Negative for tingling and numbness.      Objective  Vitals:   09/27/16 1355  BP: 122/74  Pulse: 92  Resp: 16  Temp: 98.1 F (36.7 C)  TempSrc: Oral  SpO2: 95%  Weight: 164 lb 11.2 oz (74.7 kg)  Height: 5\' 2"  (1.575 m)    Physical Exam  Constitutional: She is oriented to person, place, and time and well-developed, well-nourished, and in no distress.  HENT:  Head: Normocephalic and atraumatic.  Cardiovascular: Normal rate, regular rhythm and normal heart sounds.   No murmur heard. Pulmonary/Chest: Effort normal and breath sounds normal. She has no wheezes.  Abdominal:  Soft. Bowel sounds are normal. There is no tenderness.  Musculoskeletal:       Lumbar back: She exhibits tenderness, pain and spasm.       Back:  Neurological: She is oriented to person, place, and time.  Psychiatric: Mood, memory, affect and judgment normal.  Nursing note and vitals reviewed.    Assessment & Plan  1. DDD (degenerative disc disease), lumbar Stable, responsive to opioid therapy. Refills provided - HYDROcodone-acetaminophen (NORCO) 10-325 MG tablet; Take 1 tablet by mouth every 8 (eight) hours as needed.  Dispense: 90 tablet; Refill: 0   Kayah Hecker [facility] 09/27/2016 1:57 PM

## 2016-09-28 NOTE — Discharge Instructions (Signed)

## 2016-10-01 ENCOUNTER — Ambulatory Visit: Payer: Medicare Other | Admitting: Anesthesiology

## 2016-10-01 ENCOUNTER — Ambulatory Visit
Admission: RE | Admit: 2016-10-01 | Discharge: 2016-10-01 | Disposition: A | Discharge status: Home / Self Care | Payer: Medicare Other | Source: Ambulatory Visit | Attending: Gastroenterology | Admitting: Gastroenterology

## 2016-10-01 ENCOUNTER — Encounter
Admission: RE | Disposition: A | Discharge status: Home / Self Care | Payer: Self-pay | Source: Ambulatory Visit | Attending: Gastroenterology

## 2016-10-01 DIAGNOSIS — K219 Gastro-esophageal reflux disease without esophagitis: Secondary | ICD-10-CM | POA: Insufficient documentation

## 2016-10-01 DIAGNOSIS — D125 Benign neoplasm of sigmoid colon: Secondary | ICD-10-CM | POA: Insufficient documentation

## 2016-10-01 DIAGNOSIS — Z1211 Encounter for screening for malignant neoplasm of colon: Secondary | ICD-10-CM | POA: Diagnosis not present

## 2016-10-01 DIAGNOSIS — Z7982 Long term (current) use of aspirin: Secondary | ICD-10-CM | POA: Diagnosis not present

## 2016-10-01 DIAGNOSIS — F329 Major depressive disorder, single episode, unspecified: Secondary | ICD-10-CM | POA: Insufficient documentation

## 2016-10-01 DIAGNOSIS — F17210 Nicotine dependence, cigarettes, uncomplicated: Secondary | ICD-10-CM | POA: Insufficient documentation

## 2016-10-01 DIAGNOSIS — Z860100 Personal history of colon polyps, unspecified: Secondary | ICD-10-CM

## 2016-10-01 DIAGNOSIS — K648 Other hemorrhoids: Secondary | ICD-10-CM | POA: Insufficient documentation

## 2016-10-01 DIAGNOSIS — E785 Hyperlipidemia, unspecified: Secondary | ICD-10-CM | POA: Insufficient documentation

## 2016-10-01 DIAGNOSIS — K635 Polyp of colon: Secondary | ICD-10-CM | POA: Diagnosis not present

## 2016-10-01 DIAGNOSIS — D124 Benign neoplasm of descending colon: Secondary | ICD-10-CM | POA: Diagnosis not present

## 2016-10-01 DIAGNOSIS — D123 Benign neoplasm of transverse colon: Secondary | ICD-10-CM

## 2016-10-01 HISTORY — PX: COLONOSCOPY WITH PROPOFOL: SHX5780

## 2016-10-01 HISTORY — PX: POLYPECTOMY: SHX5525

## 2016-10-01 SURGERY — COLONOSCOPY WITH PROPOFOL
Anesthesia: Monitor Anesthesia Care | Wound class: Contaminated

## 2016-10-01 MED ORDER — PROPOFOL 10 MG/ML IV BOLUS
INTRAVENOUS | Status: DC | PRN
  Administered 2016-10-01: 50 mg via INTRAVENOUS
  Administered 2016-10-01: 100 mg via INTRAVENOUS
  Administered 2016-10-01 (×2): 50 mg via INTRAVENOUS

## 2016-10-01 MED ORDER — LIDOCAINE HCL (CARDIAC) 20 MG/ML IV SOLN
INTRAVENOUS | Status: DC | PRN
  Administered 2016-10-01: 40 mg via INTRAVENOUS

## 2016-10-01 MED ORDER — STERILE WATER FOR IRRIGATION IR SOLN
Status: DC | PRN
  Administered 2016-10-01: 09:00:00

## 2016-10-01 MED ORDER — LACTATED RINGERS IV SOLN
INTRAVENOUS | Status: DC
  Administered 2016-10-01: 07:00:00 via INTRAVENOUS

## 2016-10-01 SURGICAL SUPPLY — 23 items

## 2016-10-01 NOTE — Op Note (Signed)
[facility] Patient Name: Kim Buchanan Procedure Date: 10/01/2016 8:39 AM MRN: [id number] Account #: [id number] Date of Birth: [birth year] Admit Type: Outpatient Age: 65 Room: [facility] OR ROOM 01 Gender: Female Note Status: Finalized Procedure:            Colonoscopy Indications:          High risk colon cancer surveillance: Personal history                        of colonic polyps Providers:            Lucilla Lame MD, MD Referring MD:         Otila Back. Manuella Ghazi (Referring MD) Medicines:            Propofol per Anesthesia Complications:        No immediate complications. Procedure:            Pre-Anesthesia Assessment:                       - Prior to the procedure, a History and Physical was                        performed, and patient medications and allergies were                        reviewed. The patient's tolerance of previous                        anesthesia was also reviewed. The risks and benefits of                        the procedure and the sedation options and risks were                        discussed with the patient. All questions were                        answered, and informed consent was obtained. Prior                        Anticoagulants: The patient has taken no previous                        anticoagulant or antiplatelet agents. ASA Grade                        Assessment: II - A patient with mild systemic disease.                        After reviewing the risks and benefits, the patient was                        deemed in satisfactory condition to undergo the                        procedure.                       After obtaining informed consent, the colonoscope was  passed under direct vision. Throughout the procedure,                        the patient's blood pressure, pulse, and oxygen                        saturations were monitored continuously. The was                        introduced  through the anus and advanced to the the                        cecum, identified by appendiceal orifice and ileocecal                        valve. The colonoscopy was performed without                        difficulty. The patient tolerated the procedure well.                        The quality of the bowel preparation was excellent. Findings:      The perianal and digital rectal examinations were normal.      A 4 mm polyp was found in the transverse colon. The polyp was sessile.       The polyp was removed with a cold biopsy forceps. Resection and       retrieval were complete.      A 4 mm polyp was found in the descending colon. The polyp was sessile.       The polyp was removed with a cold biopsy forceps. Resection and       retrieval were complete.      Two sessile polyps were found in the sigmoid colon. The polyps were 3 to       4 mm in size. These polyps were removed with a cold biopsy forceps.       Resection and retrieval were complete.      Non-bleeding internal hemorrhoids were found during retroflexion. The       hemorrhoids were Grade II (internal hemorrhoids that prolapse but reduce       spontaneously). Impression:           - One 4 mm polyp in the transverse colon, removed with                        a cold biopsy forceps. Resected and retrieved.                       - One 4 mm polyp in the descending colon, removed with                        a cold biopsy forceps. Resected and retrieved.                       - Two 3 to 4 mm polyps in the sigmoid colon, removed                        with a cold biopsy forceps. Resected and retrieved.                       -  Non-bleeding internal hemorrhoids. Recommendation:       - Discharge patient to home.                       - Resume previous diet.                       - Continue present medications.                       - Await pathology results.                       - Repeat colonoscopy in 5 years for  surveillance. Procedure Code(s):    --- Professional ---                       [phone], Colonoscopy, flexible; with biopsy, single or                        multiple Diagnosis Code(s):    --- Professional ---                       Z86.010, Personal history of colonic polyps                       D12.3, Benign neoplasm of transverse colon (hepatic                        flexure or splenic flexure)                       D12.4, Benign neoplasm of descending colon                       D12.5, Benign neoplasm of sigmoid colon CPT copyright 2016 American Medical Association. All rights reserved. The codes documented in this report are preliminary and upon coder review may  be revised to meet current compliance requirements. Lucilla Lame MD, MD 10/01/2016 9:11:07 AM This report has been signed electronically. Number of Addenda: 0 Note Initiated On: 10/01/2016 8:39 AM Scope Withdrawal Time: 0 hours 9 minutes 9 seconds  Total Procedure Duration: 0 hours 18 minutes 43 seconds       [facility]

## 2016-10-01 NOTE — Anesthesia Postprocedure Evaluation (Signed)
Anesthesia Post Note  Patient: Kim Buchanan  Procedure(s) Performed: Procedure(s) (LRB): COLONOSCOPY WITH PROPOFOL (N/A) POLYPECTOMY  Patient location during evaluation: PACU Anesthesia Type: MAC Level of consciousness: awake and alert and oriented Pain management: satisfactory to patient Vital Signs Assessment: post-procedure vital signs reviewed and stable Respiratory status: spontaneous breathing, nonlabored ventilation and respiratory function stable Cardiovascular status: blood pressure returned to baseline and stable Postop Assessment: Adequate PO intake and No signs of nausea or vomiting Anesthetic complications: no    Raliegh Ip

## 2016-10-01 NOTE — Anesthesia Procedure Notes (Signed)
Procedure Name: MAC Date/Time: 10/01/2016 8:46 AM Performed by: Janna Arch Pre-anesthesia Checklist: Patient identified, Emergency Drugs available, Suction available and Patient being monitored Oxygen Delivery Method: Nasal cannula

## 2016-10-01 NOTE — Anesthesia Preprocedure Evaluation (Signed)
Anesthesia Evaluation  Patient identified by MRN, date of birth, ID band Patient awake    Reviewed: Allergy & Precautions, H&P , NPO status , Patient's Chart, lab work & pertinent test results  Airway Mallampati: II  TM Distance: >3 FB Neck ROM: full    Dental no notable dental hx.    Pulmonary Current Smoker,    Pulmonary exam normal        Cardiovascular Normal cardiovascular exam     Neuro/Psych PSYCHIATRIC DISORDERS    GI/Hepatic GERD  ,  Endo/Other    Renal/GU      Musculoskeletal   Abdominal   Peds  Hematology   Anesthesia Other Findings   Reproductive/Obstetrics                             Anesthesia Physical Anesthesia Plan  ASA: II  Anesthesia Plan: MAC   Post-op Pain Management:    Induction:   Airway Management Planned:   Additional Equipment:   Intra-op Plan:   Post-operative Plan:   Informed Consent: I have reviewed the patients History and Physical, chart, labs and discussed the procedure including the risks, benefits and alternatives for the proposed anesthesia with the patient or authorized representative who has indicated his/her understanding and acceptance.     Plan Discussed with:   Anesthesia Plan Comments:         Anesthesia Quick Evaluation  

## 2016-10-01 NOTE — Transfer of Care (Signed)
Immediate Anesthesia Transfer of Care Note  Patient: Kim Buchanan  Procedure(s) Performed: Procedure(s): COLONOSCOPY WITH PROPOFOL (N/A) POLYPECTOMY  Patient Location: PACU  Anesthesia Type: MAC  Level of Consciousness: awake, alert  and patient cooperative  Airway and Oxygen Therapy: Patient Spontanous Breathing and Patient connected to supplemental oxygen  Post-op Assessment: Post-op Vital signs reviewed, Patient's Cardiovascular Status Stable, Respiratory Function Stable, Patent Airway and No signs of Nausea or vomiting  Post-op Vital Signs: Reviewed and stable  Complications: No apparent anesthesia complications

## 2016-10-01 NOTE — H&P (Signed)
Lucilla Lame, MD [facility] [address], [place],  [address] Phone: [phone] Fax : [phone]  Primary Care Physician:  Keith Rake, MD Primary Gastroenterologist:  Dr. Allen Norris  Pre-Procedure History & Physical: HPI:  Kim Buchanan is a 65 y.o. female is here for an colonoscopy.   Past Medical History:  Diagnosis Date  . Anxiety   . Arthritis    lower back  . Depression   . GERD (gastroesophageal reflux disease)   . Hyperlipidemia   . Leaky heart valve    sees Dr Humphrey Rolls  . Panic attack   . Rectal fistula   . Wears dentures    full upper    Past Surgical History:  Procedure Laterality Date  . COLONOSCOPY  2014  . RECTAL SURGERY  06/28/2015   Rectal prolapse, laparoscopic rectopexy the coldCharmont, MD; [facility]    Prior to Admission medications   Medication Sig Start Date End Date Taking? Authorizing Provider  aspirin 81 MG tablet Take 81 mg by mouth daily.    Yes Historical Provider, MD  clonazePAM (KLONOPIN) 1 MG tablet Take 1 tablet (1 mg total) by mouth 3 (three) times daily as needed for anxiety. 08/31/16  Yes Roselee Nova, MD  HYDROcodone-acetaminophen (NORCO) 10-325 MG tablet Take 1 tablet by mouth every 8 (eight) hours as needed. 09/27/16  Yes Roselee Nova, MD  omeprazole (PRILOSEC) 40 MG capsule Take 1 capsule (40 mg total) by mouth daily. 03/22/16  Yes Roselee Nova, MD  polyethylene glycol (GOLYTELY) 236 g solution Drink one 8 oz glass every 20 mins until stools are clear. 08/03/16  Yes Lucilla Lame, MD  polyethylene glycol-electrolytes (TRILYTE) 420 g solution Drink one 8 oz glass every 20 mins until stools are clear. 09/26/16  Yes Ryiah Bellissimo Allen Norris, MD  Sennosides (LAXATIVE PILLS PO) Take 8.6 mg by mouth as needed.   Yes Historical Provider, MD  tamsulosin (FLOMAX) 0.4 MG CAPS capsule Take 1 capsule (0.4 mg total) by mouth daily. 09/05/16  Yes Nickie Retort, MD  venlafaxine XR (EFFEXOR-XR) 150 MG 24 hr capsule Take 1 capsule (150 mg total) by mouth  at bedtime. 08/31/16  Yes Roselee Nova, MD  venlafaxine XR (EFFEXOR-XR) 75 MG 24 hr capsule Take 1 capsule (75 mg total) by mouth at bedtime. 08/31/16  Yes Roselee Nova, MD    Allergies as of 09/07/2016  . (No Known Allergies)    Family History  Problem Relation Age of Onset  . Diabetes Mother   . Diabetes Sister   . Diabetes Brother   . Lymphoma Brother   . Breast cancer Neg Hx   . Ovarian cancer Neg Hx   . Colon cancer Neg Hx     Social History   Social History  . Marital status: Married    Spouse name: N/A  . Number of children: N/A  . Years of education: N/A   Occupational History  . Not on file.   Social History Main Topics  . Smoking status: Current Every Day Smoker    Packs/day: 0.50    Years: 45.00    Types: Cigarettes  . Smokeless tobacco: Never Used  . Alcohol use No  . Drug use: No  . Sexual activity: Not Currently   Other Topics Concern  . Not on file   Social History Narrative  . No narrative on file    Review of Systems: See HPI, otherwise negative ROS  Physical Exam: BP 129/77  Pulse 77   Temp 97.3 F (36.3 C) (Temporal)   Resp 16   Ht 5\' 2"  (1.575 m)   Wt 162 lb (73.5 kg)   SpO2 100%   BMI 29.63 kg/m  General:   Alert,  pleasant and cooperative in NAD Head:  Normocephalic and atraumatic. Neck:  Supple; no masses or thyromegaly. Lungs:  Clear throughout to auscultation.    Heart:  Regular rate and rhythm. Abdomen:  Soft, nontender and nondistended. Normal bowel sounds, without guarding, and without rebound.   Neurologic:  Alert and  oriented x4;  grossly normal neurologically.  Impression/Plan: Kim Buchanan is here for an colonoscopy to be performed for history of polyps  Risks, benefits, limitations, and alternatives regarding  colonoscopy have been reviewed with the patient.  Questions have been answered.  All parties agreeable.   Lucilla Lame, MD  10/01/2016, 7:49 AM

## 2016-10-02 ENCOUNTER — Encounter: Payer: Self-pay | Admitting: Gastroenterology

## 2016-10-03 ENCOUNTER — Encounter: Payer: Self-pay | Admitting: Gastroenterology

## 2016-10-04 ENCOUNTER — Encounter: Payer: Self-pay | Admitting: Gastroenterology

## 2016-10-25 ENCOUNTER — Ambulatory Visit: Payer: Medicare Other | Admitting: Family Medicine

## 2016-10-29 ENCOUNTER — Telehealth: Payer: Self-pay | Admitting: Family Medicine

## 2016-10-29 NOTE — Progress Notes (Signed)
ANNUAL PREVENTATIVE CARE GYN  ENCOUNTER NOTE  Subjective:       Kim Buchanan is a 66 y.o. G3P1001 female here for a routine annual gynecologic exam.  Current complaints: 1.   Something comes out of the vagina- x 2 months  Patient notes and a size bulge of a mass coming out of the vagina over the past 2 months. The mass presents itself when she is standing or straining; it reduces when she lies down. Patient has been seen by urology recent started on Flomax for difficulty voiding. Bowel function is reportedly normal.   Gynecologic History No LMP recorded. Patient is postmenopausal. Contraception: post menopausal status Last Pap: she states never. Results were: normal Last mammogram: 04/2014 birad 2 . Results were: normal  Obstetric History OB History  Gravida Para Term Preterm AB Living  1 1 1     1   SAB TAB Ectopic Multiple Live Births          1    # Outcome Date GA Lbr Len/2nd Weight Sex Delivery Anes PTL Lv  1 Term 1971   8 lb (3.629 kg) M Vag-Spont   LIV    Obstetric Comments  Son Passed at age 17 due to MVA    Past Medical History:  Diagnosis Date  . Anxiety   . Arthritis    lower back  . Depression   . GERD (gastroesophageal reflux disease)   . Hyperlipidemia   . Leaky heart valve    sees Dr Humphrey Rolls  . Panic attack   . Rectal fistula   . Wears dentures    full upper    Past Surgical History:  Procedure Laterality Date  . COLONOSCOPY  2014  . COLONOSCOPY WITH PROPOFOL N/A 10/01/2016   Procedure: COLONOSCOPY WITH PROPOFOL;  Surgeon: Lucilla Lame, MD;  Location: [place];  Service: Endoscopy;  Laterality: N/A;  . POLYPECTOMY  10/01/2016   Procedure: POLYPECTOMY;  Surgeon: Lucilla Lame, MD;  Location: [facility];  Service: Endoscopy;;  . RECTAL SURGERY  06/28/2015   Rectal prolapse, laparoscopic rectopexy the coldCharmont, MD; [facility]    Current Outpatient Prescriptions on File Prior to Visit  Medication Sig Dispense Refill  . aspirin 81 MG  tablet Take 81 mg by mouth daily.     . clonazePAM (KLONOPIN) 1 MG tablet Take 1 tablet (1 mg total) by mouth 3 (three) times daily as needed for anxiety. 90 tablet 0  . HYDROcodone-acetaminophen (NORCO) 10-325 MG tablet Take 1 tablet by mouth every 8 (eight) hours as needed. 90 tablet 0  . omeprazole (PRILOSEC) 40 MG capsule Take 1 capsule (40 mg total) by mouth daily. 90 capsule 1  . polyethylene glycol (GOLYTELY) 236 g solution Drink one 8 oz glass every 20 mins until stools are clear. 4000 mL 0  . polyethylene glycol-electrolytes (TRILYTE) 420 g solution Drink one 8 oz glass every 20 mins until stools are clear. 4000 mL 0  . Sennosides (LAXATIVE PILLS PO) Take 8.6 mg by mouth as needed.    . tamsulosin (FLOMAX) 0.4 MG CAPS capsule Take 1 capsule (0.4 mg total) by mouth daily. 30 capsule 11  . venlafaxine XR (EFFEXOR-XR) 150 MG 24 hr capsule Take 1 capsule (150 mg total) by mouth at bedtime. 90 capsule 1  . venlafaxine XR (EFFEXOR-XR) 75 MG 24 hr capsule Take 1 capsule (75 mg total) by mouth at bedtime. 90 capsule 1   No current facility-administered medications on file prior to visit.  No Known Allergies  Social History   Social History  . Marital status: Married    Spouse name: N/A  . Number of children: N/A  . Years of education: N/A   Occupational History  . Not on file.   Social History Main Topics  . Smoking status: Current Every Day Smoker    Packs/day: 0.50    Years: 45.00    Types: Cigarettes  . Smokeless tobacco: Never Used  . Alcohol use No  . Drug use: No  . Sexual activity: Not Currently   Other Topics Concern  . Not on file   Social History Narrative  . No narrative on file    Family History  Problem Relation Age of Onset  . Diabetes Mother   . Diabetes Sister   . Diabetes Brother   . Lymphoma Brother   . Breast cancer Neg Hx   . Ovarian cancer Neg Hx   . Colon cancer Neg Hx     The following portions of the patient's history were reviewed  and updated as appropriate: allergies, current medications, past family history, past medical history, past social history, past surgical history and problem list.  Review of Systems ROS Review of Systems - General ROS: negative for - chills, fatigue, fever, hot flashes, night sweats, weight gain or weight loss Psychological ROS: negative for - anxiety, decreased libido, depression, mood swings, physical abuse or sexual abuse Ophthalmic ROS: negative for - blurry vision, eye pain or loss of vision ENT ROS: negative for - headaches, hearing change, visual changes or vocal changes Allergy and Immunology ROS: negative for - hives, itchy/watery eyes or seasonal allergies Hematological and Lymphatic ROS: negative for - bleeding problems, bruising, swollen lymph nodes or weight loss Endocrine ROS: negative for - galactorrhea, hair pattern changes, hot flashes, malaise/lethargy, mood swings, palpitations, polydipsia/polyuria, skin changes, temperature intolerance or unexpected weight changes Breast ROS: negative for - new or changing breast lumps or nipple discharge Respiratory ROS: negative for - cough or shortness of breath Cardiovascular ROS: negative for - chest pain, irregular heartbeat, palpitations or shortness of breath Gastrointestinal ROS: no abdominal pain, change in bowel habits, or black or bloody stools Genito-Urinary ROS: no dysuria, trouble voiding, or hematuria Musculoskeletal ROS: negative for - joint pain or joint stiffness Neurological ROS: negative for - bowel and bladder control changes Dermatological ROS: negative for rash and skin lesion changes   Objective:   BP 139/62   Pulse 98   Ht 5\' 2"  (1.575 m)   Wt 167 lb 14.4 oz (76.2 kg)   BMI 30.71 kg/m   CONSTITUTIONAL: Well-developed, well-nourished female in no acute distress.  PSYCHIATRIC: Normal mood and affect. Normal behavior. Normal judgment and thought content. [place]: Alert and oriented to person, place, and  time. Normal muscle tone coordination. No cranial nerve deficit noted. HENT:  Normocephalic, atraumatic, External right and left ear normal. Oropharynx is clear and moist EYES: Conjunctivae and EOM are normal. Pupils are equal, round, and reactive to light. No scleral icterus.  NECK: Normal range of motion, supple, no masses.  Normal thyroid.  SKIN: Skin is warm and dry. No rash noted. Not diaphoretic. No erythema. No pallor. CARDIOVASCULAR: Normal heart rate noted, regular rhythm, no murmur. RESPIRATORY: Clear to auscultation bilaterally. Effort and breath sounds normal, no problems with respiration noted. BREASTS: Symmetric in size. No masses, skin changes, nipple drainage, or lymphadenopathy. ABDOMEN: Soft, normal bowel sounds, no distention noted.  No tenderness, rebound or guarding.  BLADDER: Normal PELVIC:  External  Genitalia: Normal  BUS: Normal  Vagina: Mild atrophic changes  Cervix: Normal; parous; prolapsed to the introitus  Uterus: Normal; midplane, mobile, nontender, normal size and shape  Adnexa: Normal; nonpalpable and nontender  RV: External Exam NormaI, No Rectal Masses and Normal Sphincter tone  MUSCULOSKELETAL: Normal range of motion. No tenderness.  No cyanosis, clubbing, or edema.  2+ distal pulses. LYMPHATIC: No Axillary, Supraclavicular, or Inguinal Adenopathy.    Assessment:   Annual gynecologic examination 66 y.o. Contraception: post menopausal status bmi-30 Vaginal atrophy Uterine prolapse to introitus Traction cystocele  Plan:  Pap: Pap Co Test Mammogram: Ordered Stool Guaiac Testing:  Colonoscopy 09/2016- polyps- repeat in 5 years Labs: thru pcp Routine preventative health maintenance measures emphasized: Exercise/Diet/Weight control, Tobacco Warnings and Alcohol/Substance use risks  Patient declines pessary trial Estrace cream intravaginal 1/2 g nightly for 30 days Schedule TVH BSO with anterior colporrhaphy-February 2018 Return for preoperative  appointment week before surgery Return to [place], CMA  Brayton Mars, MD  Note: This dictation was prepared with Dragon dictation along with smaller phrase technology. Any transcriptional errors that result from this process are unintentional.

## 2016-10-29 NOTE — Telephone Encounter (Signed)
Pt had appointment for 10/25/16 but had to reschedule for 11/06/16 due to you not being in the office. Pt requesting a refill on hydrocodone (90 tablets) and clonazepam.

## 2016-10-30 ENCOUNTER — Other Ambulatory Visit: Payer: Self-pay | Admitting: Emergency Medicine

## 2016-10-30 DIAGNOSIS — F329 Major depressive disorder, single episode, unspecified: Secondary | ICD-10-CM

## 2016-10-30 DIAGNOSIS — F32A Depression, unspecified: Secondary | ICD-10-CM

## 2016-10-30 DIAGNOSIS — M5136 Other intervertebral disc degeneration, lumbar region: Secondary | ICD-10-CM

## 2016-10-30 DIAGNOSIS — F419 Anxiety disorder, unspecified: Secondary | ICD-10-CM

## 2016-10-30 MED ORDER — HYDROCODONE-ACETAMINOPHEN 10-325 MG PO TABS: 1.0000 | ORAL_TABLET | Freq: Three times a day (TID) | ORAL | 0 refills | Status: DC | PRN

## 2016-10-30 MED ORDER — CLONAZEPAM 1 MG PO TABS: 1.0000 mg | ORAL_TABLET | Freq: Three times a day (TID) | ORAL | 0 refills | Status: DC | PRN

## 2016-10-30 NOTE — Telephone Encounter (Signed)
Patient notified to pick up script.

## 2016-10-31 ENCOUNTER — Encounter: Payer: Self-pay | Admitting: Obstetrics and Gynecology

## 2016-10-31 ENCOUNTER — Ambulatory Visit (INDEPENDENT_AMBULATORY_CARE_PROVIDER_SITE_OTHER): Payer: Medicare Other | Admitting: Obstetrics and Gynecology

## 2016-10-31 VITALS — BP 139/62 | HR 98 | Ht 62.0 in | Wt 167.9 lb

## 2016-10-31 DIAGNOSIS — N8111 Cystocele, midline: Secondary | ICD-10-CM

## 2016-10-31 DIAGNOSIS — Z1239 Encounter for other screening for malignant neoplasm of breast: Secondary | ICD-10-CM

## 2016-10-31 DIAGNOSIS — Z1231 Encounter for screening mammogram for malignant neoplasm of breast: Secondary | ICD-10-CM | POA: Diagnosis not present

## 2016-10-31 DIAGNOSIS — Z01419 Encounter for gynecological examination (general) (routine) without abnormal findings: Secondary | ICD-10-CM | POA: Diagnosis not present

## 2016-10-31 DIAGNOSIS — N952 Postmenopausal atrophic vaginitis: Secondary | ICD-10-CM | POA: Diagnosis not present

## 2016-10-31 DIAGNOSIS — N814 Uterovaginal prolapse, unspecified: Secondary | ICD-10-CM | POA: Diagnosis not present

## 2016-10-31 MED ORDER — ESTRADIOL 0.1 MG/GM VA CREA: 0.5000 g | TOPICAL_CREAM | Freq: Every day | VAGINAL | 2 refills | Status: DC

## 2016-10-31 NOTE — Patient Instructions (Signed)
1. Pap smear is done 2. Mammogram is ordered 3. Stool guaiac cards are not given because patient had colonoscopy in December 2017 4. Screening labs are to be done by primary care 5. Return in 1 year for annual exam 6. Begin using Estrace cream 1/2 g intravaginal every night for the next 30 days 7. Surgery will be scheduled for February 2018-transvaginal hysterectomy with bilateral salpingo-oophorectomy and anterior colporrhaphy 8. Return in 1 week before surgery for preoperative appointment   Hysterectomy Information A hysterectomy is a surgery in which your uterus is removed. This surgery may be done to treat various medical problems. After the surgery, you will no longer have menstrual periods. The surgery will also make you unable to become pregnant (sterile). The fallopian tubes and ovaries can be removed (bilateral salpingo-oophorectomy) during this surgery as well. Reasons for a hysterectomy  Persistent, abnormal bleeding.  Lasting (chronic) pelvic pain or infection.  The lining of the uterus (endometrium) starts growing outside the uterus (endometriosis).  The endometrium starts growing in the muscle of the uterus (adenomyosis).  The uterus falls down into the vagina (pelvic organ prolapse).  Noncancerous growths in the uterus (uterine fibroids) that cause symptoms.  Precancerous cells.  Cervical cancer or uterine cancer. Types of hysterectomies  Supracervical hysterectomy-In this type, the top part of the uterus is removed, but not the cervix.  Total hysterectomy-The uterus and cervix are removed.  Radical hysterectomy-The uterus, the cervix, and the fibrous tissue that holds the uterus in place in the pelvis (parametrium) are removed. Ways a hysterectomy can be performed  Abdominal hysterectomy-A large surgical cut (incision) is made in the abdomen. The uterus is removed through this incision.  Vaginal hysterectomy-An incision is made in the vagina. The uterus is  removed through this incision. There are no abdominal incisions.  Conventional laparoscopic hysterectomy-Three or four small incisions are made in the abdomen. A thin, lighted tube with a camera (laparoscope) is inserted into one of the incisions. Other tools are put through the other incisions. The uterus is cut into small pieces. The small pieces are removed through the incisions, or they are removed through the vagina.  Laparoscopically assisted vaginal hysterectomy (LAVH)-Three or four small incisions are made in the abdomen. Part of the surgery is performed laparoscopically and part vaginally. The uterus is removed through the vagina.  Robot-assisted laparoscopic hysterectomy-A laparoscope and other tools are inserted into 3 or 4 small incisions in the abdomen. A computer-controlled device is used to give the surgeon a 3D image and to help control the surgical instruments. This allows for more precise movements of surgical instruments. The uterus is cut into small pieces and removed through the incisions or removed through the vagina. What are the risks? Possible complications associated with this procedure include:  Bleeding and risk of blood transfusion. Tell your health care provider if you do not want to receive any blood products.  Blood clots in the legs or lung.  Infection.  Injury to surrounding organs.  Problems or side effects related to anesthesia.  Conversion to an abdominal hysterectomy from one of the other techniques. What to expect after a hysterectomy  You will be given pain medicine.  You will need to have someone with you for the first 3-5 days after you go home.  You will need to follow up with your surgeon in 2-4 weeks after surgery to evaluate your progress.  You may have early menopause symptoms such as hot flashes, night sweats, and insomnia.  If you  had a hysterectomy for a problem that was not cancer or not a condition that could lead to cancer, then you  no longer need Pap tests. However, even if you no longer need a Pap test, a regular exam is a good idea to make sure no other problems are starting. This information is not intended to replace advice given to you by your health care provider. Make sure you discuss any questions you have with your health care provider. Document Released: 04/03/2001 Document Revised: 03/15/2016 Document Reviewed: 06/15/2013 Elsevier Interactive Patient Education  2017 [place] Maintenance for Postmenopausal Women Introduction Menopause is a normal process in which your reproductive ability comes to an end. This process happens gradually over a span of months to years, usually between the ages of 9 and 17. Menopause is complete when you have missed 12 consecutive menstrual periods. It is important to talk with your health care provider about some of the most common conditions that affect postmenopausal women, such as heart disease, cancer, and bone loss (osteoporosis). Adopting a healthy lifestyle and getting preventive care can help to promote your health and wellness. Those actions can also lower your chances of developing some of these common conditions. What should I know about menopause? During menopause, you may experience a number of symptoms, such as:  Moderate-to-severe hot flashes.  Night sweats.  Decrease in sex drive.  Mood swings.  Headaches.  Tiredness.  Irritability.  Memory problems.  Insomnia. Choosing to treat or not to treat menopausal changes is an individual decision that you make with your health care provider. What should I know about hormone replacement therapy and supplements? Hormone therapy products are effective for treating symptoms that are associated with menopause, such as hot flashes and night sweats. Hormone replacement carries certain risks, especially as you become older. If you are thinking about using estrogen or estrogen with progestin treatments,  discuss the benefits and risks with your health care provider. What should I know about heart disease and stroke? Heart disease, heart attack, and stroke become more likely as you age. This may be due, in part, to the hormonal changes that your body experiences during menopause. These can affect how your body processes dietary fats, triglycerides, and cholesterol. Heart attack and stroke are both medical emergencies. There are many things that you can do to help prevent heart disease and stroke:  Have your blood pressure checked at least every 1-2 years. High blood pressure causes heart disease and increases the risk of stroke.  If you are 61-93 years old, ask your health care provider if you should take aspirin to prevent a heart attack or a stroke.  Do not use any tobacco products, including cigarettes, chewing tobacco, or electronic cigarettes. If you need help quitting, ask your health care provider.  It is important to eat a healthy diet and maintain a healthy weight.  Be sure to include plenty of vegetables, fruits, low-fat dairy products, and lean protein.  Avoid eating foods that are high in solid fats, added sugars, or salt (sodium).  Get regular exercise. This is one of the most important things that you can do for your health.  Try to exercise for at least 150 minutes each week. The type of exercise that you do should increase your heart rate and make you sweat. This is known as moderate-intensity exercise.  Try to do strengthening exercises at least twice each week. Do these in addition to the moderate-intensity exercise.  Know your numbers.Ask your  health care provider to check your cholesterol and your blood glucose. Continue to have your blood tested as directed by your health care provider. What should I know about cancer screening? There are several types of cancer. Take the following steps to reduce your risk and to catch any cancer development as early as  possible. Breast Cancer  Practice breast self-awareness.  This means understanding how your breasts normally appear and feel.  It also means doing regular breast self-exams. Let your health care provider know about any changes, no matter how small.  If you are 58 or older, have a clinician do a breast exam (clinical breast exam or CBE) every year. Depending on your age, family history, and medical history, it may be recommended that you also have a yearly breast X-ray (mammogram).  If you have a family history of breast cancer, talk with your health care provider about genetic screening.  If you are at high risk for breast cancer, talk with your health care provider about having an MRI and a mammogram every year.  Breast cancer (BRCA) gene test is recommended for women who have family members with BRCA-related cancers. Results of the assessment will determine the need for genetic counseling and BRCA1 and for BRCA2 testing. BRCA-related cancers include these types:  Breast. This occurs in males or females.  Ovarian.  Tubal. This may also be called fallopian tube cancer.  Cancer of the abdominal or pelvic lining (peritoneal cancer).  Prostate.  Pancreatic. Cervical, Uterine, and Ovarian Cancer  Your health care provider may recommend that you be screened regularly for cancer of the pelvic organs. These include your ovaries, uterus, and vagina. This screening involves a pelvic exam, which includes checking for microscopic changes to the surface of your cervix (Pap test).  For women ages 21-65, health care providers may recommend a pelvic exam and a Pap test every three years. For women ages 75-65, they may recommend the Pap test and pelvic exam, combined with testing for human papilloma virus (HPV), every five years. Some types of HPV increase your risk of cervical cancer. Testing for HPV may also be done on women of any age who have unclear Pap test results.  Other health care  providers may not recommend any screening for nonpregnant women who are considered low risk for pelvic cancer and have no symptoms. Ask your health care provider if a screening pelvic exam is right for you.  If you have had past treatment for cervical cancer or a condition that could lead to cancer, you need Pap tests and screening for cancer for at least 20 years after your treatment. If Pap tests have been discontinued for you, your risk factors (such as having a new sexual partner) need to be reassessed to determine if you should start having screenings again. Some women have medical problems that increase the chance of getting cervical cancer. In these cases, your health care provider may recommend that you have screening and Pap tests more often.  If you have a family history of uterine cancer or ovarian cancer, talk with your health care provider about genetic screening.  If you have vaginal bleeding after reaching menopause, tell your health care provider.  There are currently no reliable tests available to screen for ovarian cancer. Lung Cancer  Lung cancer screening is recommended for adults 59-101 years old who are at high risk for lung cancer because of a history of smoking. A yearly low-dose CT scan of the lungs is recommended if you:  Currently smoke.  Have a history of at least 30 pack-years of smoking and you currently smoke or have quit within the past 15 years. A pack-year is smoking an average of one pack of cigarettes per day for one year. Yearly screening should:  Continue until it has been 15 years since you quit.  Stop if you develop a health problem that would prevent you from having lung cancer treatment. Colorectal Cancer  This type of cancer can be detected and can often be prevented.  Routine colorectal cancer screening usually begins at age 76 and continues through age 58.  If you have risk factors for colon cancer, your health care provider may recommend that you  be screened at an earlier age.  If you have a family history of colorectal cancer, talk with your health care provider about genetic screening.  Your health care provider may also recommend using home test kits to check for hidden blood in your stool.  A small camera at the end of a tube can be used to examine your colon directly (sigmoidoscopy or colonoscopy). This is done to check for the earliest forms of colorectal cancer.  Direct examination of the colon should be repeated every 5-10 years until age 16. However, if early forms of precancerous polyps or small growths are found or if you have a family history or genetic risk for colorectal cancer, you may need to be screened more often. Skin Cancer  Check your skin from head to toe regularly.  Monitor any moles. Be sure to tell your health care provider:  About any new moles or changes in moles, especially if there is a change in a mole's shape or color.  If you have a mole that is larger than the size of a pencil eraser.  If any of your family members has a history of skin cancer, especially at a young age, talk with your health care provider about genetic screening.  Always use sunscreen. Apply sunscreen liberally and repeatedly throughout the day.  Whenever you are outside, protect yourself by wearing long sleeves, pants, a wide-brimmed hat, and sunglasses. What should I know about osteoporosis? Osteoporosis is a condition in which bone destruction happens more quickly than new bone creation. After menopause, you may be at an increased risk for osteoporosis. To help prevent osteoporosis or the bone fractures that can happen because of osteoporosis, the following is recommended:  If you are 110-34 years old, get at least 1,000 mg of calcium and at least 600 mg of vitamin D per day.  If you are older than age 73 but younger than age 36, get at least 1,200 mg of calcium and at least 600 mg of vitamin D per day.  If you are older than  age 75, get at least 1,200 mg of calcium and at least 800 mg of vitamin D per day. Smoking and excessive alcohol intake increase the risk of osteoporosis. Eat foods that are rich in calcium and vitamin D, and do weight-bearing exercises several times each week as directed by your health care provider. What should I know about how menopause affects my mental health? Depression may occur at any age, but it is more common as you become older. Common symptoms of depression include:  Low or sad mood.  Changes in sleep patterns.  Changes in appetite or eating patterns.  Feeling an overall lack of motivation or enjoyment of activities that you previously enjoyed.  Frequent crying spells. Talk with your health care provider  if you think that you are experiencing depression. What should I know about immunizations? It is important that you get and maintain your immunizations. These include:  Tetanus, diphtheria, and pertussis (Tdap) booster vaccine.  Influenza every year before the flu season begins.  Pneumonia vaccine.  Shingles vaccine. Your health care provider may also recommend other immunizations. This information is not intended to replace advice given to you by your health care provider. Make sure you discuss any questions you have with your health care provider. Document Released: 11/30/2005 Document Revised: 04/27/2016 Document Reviewed: 07/12/2015  2017 Elsevier

## 2016-11-01 ENCOUNTER — Telehealth: Payer: Self-pay | Admitting: Obstetrics and Gynecology

## 2016-11-01 MED ORDER — ESTRADIOL 0.1 MG/GM VA CREA: 0.2500 | TOPICAL_CREAM | Freq: Every day | VAGINAL | 2 refills | Status: DC

## 2016-11-01 NOTE — Telephone Encounter (Signed)
Pt went to pharmacy and the rx was $217.00 and she can't afford that. Can you send something else?

## 2016-11-01 NOTE — Addendum Note (Signed)
Addended by: Elouise Munroe on: 11/01/2016 03:40 PM   Modules accepted: Orders

## 2016-11-02 LAB — PAP IG AND HPV HIGH-RISK
HPV, high-risk: NEGATIVE
PAP Smear Comment: 0

## 2016-11-02 NOTE — Telephone Encounter (Signed)
Per RF pt called back(let on VM) and was able to get the estradiol cream. She was self pay. It cost her around 85.00.

## 2016-11-06 ENCOUNTER — Ambulatory Visit: Payer: Medicare Other | Admitting: Family Medicine

## 2016-11-28 ENCOUNTER — Encounter: Payer: Self-pay | Admitting: Family Medicine

## 2016-11-28 ENCOUNTER — Ambulatory Visit (INDEPENDENT_AMBULATORY_CARE_PROVIDER_SITE_OTHER): Payer: Medicare Other | Admitting: Family Medicine

## 2016-11-28 DIAGNOSIS — F418 Other specified anxiety disorders: Secondary | ICD-10-CM

## 2016-11-28 DIAGNOSIS — M5136 Other intervertebral disc degeneration, lumbar region: Secondary | ICD-10-CM | POA: Diagnosis not present

## 2016-11-28 DIAGNOSIS — K219 Gastro-esophageal reflux disease without esophagitis: Secondary | ICD-10-CM

## 2016-11-28 DIAGNOSIS — F419 Anxiety disorder, unspecified: Secondary | ICD-10-CM

## 2016-11-28 DIAGNOSIS — F32A Depression, unspecified: Secondary | ICD-10-CM

## 2016-11-28 DIAGNOSIS — F329 Major depressive disorder, single episode, unspecified: Secondary | ICD-10-CM

## 2016-11-28 MED ORDER — CLONAZEPAM 1 MG PO TABS: 1.0000 mg | ORAL_TABLET | Freq: Three times a day (TID) | ORAL | 0 refills | Status: DC | PRN

## 2016-11-28 MED ORDER — HYDROCODONE-ACETAMINOPHEN 10-325 MG PO TABS: 1.0000 | ORAL_TABLET | Freq: Three times a day (TID) | ORAL | 0 refills | Status: DC | PRN

## 2016-11-28 MED ORDER — OMEPRAZOLE 40 MG PO CPDR: 40.0000 mg | DELAYED_RELEASE_CAPSULE | Freq: Every day | ORAL | 1 refills | Status: DC

## 2016-11-28 NOTE — Progress Notes (Signed)
Name: Kim Buchanan   MRN: [id number]    DOB: 05-22-1951   Date:11/28/2016       Progress Note  Subjective  Chief Complaint  Chief Complaint  Patient presents with  . Follow-up    1 mo  . Medication Refill    Back Pain  This is a chronic problem. The current episode started more than 1 month ago. The problem has been gradually improving since onset. The pain is present in the lumbar spine. The pain does not radiate. The pain is at a severity of 2/10. The pain is mild. The symptoms are aggravated by standing (standing for long time). Pertinent negatives include no abdominal pain, bladder incontinence, bowel incontinence, leg pain, numbness or tingling. She has tried analgesics (Opioid therapy.) for the symptoms.  Anxiety  Presents for follow-up visit. Symptoms include depressed mood, excessive worry, insomnia, irritability, nervous/anxious behavior and panic. Patient reports no nausea. The severity of symptoms is moderate. The quality of sleep is fair.    Gastroesophageal Reflux  She reports no abdominal pain, no coughing, no heartburn, no nausea or no sore throat. This is a chronic problem. She has tried a PPI for the symptoms. The treatment provided significant relief.     Past Medical History:  Diagnosis Date  . Anxiety   . Arthritis    lower back  . Depression   . GERD (gastroesophageal reflux disease)   . Hyperlipidemia   . Leaky heart valve    sees Dr Humphrey Rolls  . Panic attack   . Rectal fistula   . Wears dentures    full upper    Past Surgical History:  Procedure Laterality Date  . COLONOSCOPY  2014  . COLONOSCOPY WITH PROPOFOL N/A 10/01/2016   Procedure: COLONOSCOPY WITH PROPOFOL;  Surgeon: Lucilla Lame, MD;  Location: [place];  Service: Endoscopy;  Laterality: N/A;  . POLYPECTOMY  10/01/2016   Procedure: POLYPECTOMY;  Surgeon: Lucilla Lame, MD;  Location: [place];  Service: Endoscopy;;  . RECTAL SURGERY  06/28/2015   Rectal prolapse,  laparoscopic rectopexy the coldCharmont, MD; [facility]  . TUBAL LIGATION      Family History  Problem Relation Age of Onset  . Diabetes Mother   . Diabetes Sister   . Diabetes Brother   . Lymphoma Brother   . Breast cancer Neg Hx   . Ovarian cancer Neg Hx   . Colon cancer Neg Hx     Social History   Social History  . Marital status: Married    Spouse name: N/A  . Number of children: N/A  . Years of education: N/A   Occupational History  . Not on file.   Social History Main Topics  . Smoking status: Current Every Day Smoker    Packs/day: 0.50    Years: 45.00    Types: Cigarettes  . Smokeless tobacco: Never Used  . Alcohol use No  . Drug use: No  . Sexual activity: Not Currently   Other Topics Concern  . Not on file   Social History Narrative  . No narrative on file     Current Outpatient Prescriptions:  .  aspirin 81 MG tablet, Take 81 mg by mouth daily. , Disp: , Rfl:  .  clonazePAM (KLONOPIN) 1 MG tablet, Take 1 tablet (1 mg total) by mouth 3 (three) times daily as needed for anxiety., Disp: 90 tablet, Rfl: 0 .  estradiol (ESTRACE VAGINAL) 0.1 MG/GM vaginal cream, Place [id number] Applicatorfuls vaginally daily. 1/2 g intravaginal  nightly 30 days, Disp: 42.5 g, Rfl: 2 .  HYDROcodone-acetaminophen (NORCO) 10-325 MG tablet, Take 1 tablet by mouth every 8 (eight) hours as needed., Disp: 90 tablet, Rfl: 0 .  omeprazole (PRILOSEC) 40 MG capsule, Take 1 capsule (40 mg total) by mouth daily., Disp: 90 capsule, Rfl: 1 .  Polyethylene Glycol 3350 (MIRALAX PO), Take by mouth., Disp: , Rfl:  .  Sennosides (LAXATIVE PILLS PO), Take 8.6 mg by mouth as needed., Disp: , Rfl:  .  venlafaxine XR (EFFEXOR-XR) 150 MG 24 hr capsule, Take 1 capsule (150 mg total) by mouth at bedtime., Disp: 90 capsule, Rfl: 1 .  venlafaxine XR (EFFEXOR-XR) 75 MG 24 hr capsule, Take 1 capsule (75 mg total) by mouth at bedtime., Disp: 90 capsule, Rfl: 1  No Known Allergies   Review of Systems    Constitutional: Positive for irritability.  HENT: Negative for sore throat.   Respiratory: Negative for cough.   Gastrointestinal: Negative for abdominal pain, bowel incontinence, heartburn and nausea.  Genitourinary: Negative for bladder incontinence.  Musculoskeletal: Positive for back pain.  Neurological: Negative for tingling and numbness.  Psychiatric/Behavioral: The patient is nervous/anxious and has insomnia.       Objective  Vitals:   11/28/16 0808  BP: 138/76  Pulse: (!) 105  Resp: 17  Temp: 98.7 F (37.1 C)  TempSrc: Oral  SpO2: 95%  Weight: 166 lb 4.8 oz (75.4 kg)  Height: 5\' 2"  (1.575 m)    Physical Exam  Constitutional: She is well-developed, well-nourished, and in no distress.  Cardiovascular: Normal rate, regular rhythm and normal heart sounds.   No murmur heard. Pulmonary/Chest: Effort normal. She has no wheezes.  Abdominal: Soft. Bowel sounds are normal. There is no tenderness.  Musculoskeletal:       Lumbar back: She exhibits tenderness, pain and spasm.       Back:  Psychiatric: Mood, memory, affect and judgment normal.  Nursing note and vitals reviewed.    Assessment & Plan  1. Anxiety and depression Stable and responsive to Clonazepam taken upto three times daily as needed. - clonazePAM (KLONOPIN) 1 MG tablet; Take 1 tablet (1 mg total) by mouth 3 (three) times daily as needed for anxiety.  Dispense: 90 tablet; Refill: 0  2. DDD (degenerative disc disease), lumbar Pain responsive to Hydrocodone taken as prescribed, pt. Compliant with controlled substances agreement. Refills provided and follow up in 1 month - HYDROcodone-acetaminophen (NORCO) 10-325 MG tablet; Take 1 tablet by mouth every 8 (eight) hours as needed.  Dispense: 90 tablet; Refill: 0  3. Gastroesophageal reflux disease, esophagitis presence not specified  - omeprazole (PRILOSEC) 40 MG capsule; Take 1 capsule (40 mg total) by mouth daily.  Dispense: 90 capsule; Refill:  1   Nashid Pellum [facility] 11/28/2016 8:31 AM

## 2016-12-06 ENCOUNTER — Ambulatory Visit: Payer: Medicare Other

## 2016-12-12 ENCOUNTER — Encounter: Payer: Self-pay | Admitting: Obstetrics and Gynecology

## 2016-12-12 ENCOUNTER — Ambulatory Visit (INDEPENDENT_AMBULATORY_CARE_PROVIDER_SITE_OTHER): Payer: Medicare Other | Admitting: Obstetrics and Gynecology

## 2016-12-12 ENCOUNTER — Encounter
Admission: RE | Admit: 2016-12-12 | Discharge: 2016-12-12 | Disposition: A | Discharge status: Home / Self Care | Payer: Medicare Other | Source: Ambulatory Visit | Attending: Obstetrics and Gynecology | Admitting: Obstetrics and Gynecology

## 2016-12-12 VITALS — BP 99/59 | HR 108 | Ht 62.0 in | Wt 167.4 lb

## 2016-12-12 DIAGNOSIS — E785 Hyperlipidemia, unspecified: Secondary | ICD-10-CM | POA: Diagnosis not present

## 2016-12-12 DIAGNOSIS — N814 Uterovaginal prolapse, unspecified: Secondary | ICD-10-CM

## 2016-12-12 DIAGNOSIS — I447 Left bundle-branch block, unspecified: Secondary | ICD-10-CM | POA: Diagnosis not present

## 2016-12-12 DIAGNOSIS — N8111 Cystocele, midline: Secondary | ICD-10-CM

## 2016-12-12 DIAGNOSIS — Z01818 Encounter for other preprocedural examination: Secondary | ICD-10-CM | POA: Diagnosis not present

## 2016-12-12 DIAGNOSIS — I10 Essential (primary) hypertension: Secondary | ICD-10-CM | POA: Diagnosis not present

## 2016-12-12 HISTORY — DX: Sleep apnea, unspecified: G47.30

## 2016-12-12 LAB — CBC WITH DIFFERENTIAL/PLATELET
Basophils Absolute: 0 10*3/uL (ref 0–0.1)
Basophils Relative: 1 %
Eosinophils Absolute: 0.2 10*3/uL (ref 0–0.7)
Eosinophils Relative: 3 %
HCT: 40.1 % (ref 35.0–47.0)
Hemoglobin: 13.9 g/dL (ref 12.0–16.0)
Lymphocytes Relative: 37 %
Lymphs Abs: 2.3 10*3/uL (ref 1.0–3.6)
MCH: 31 pg (ref 26.0–34.0)
MCHC: 34.6 g/dL (ref 32.0–36.0)
MCV: 89.6 fL (ref 80.0–100.0)
Monocytes Absolute: 0.4 10*3/uL (ref 0.2–0.9)
Monocytes Relative: 7 %
Neutro Abs: 3.3 10*3/uL (ref 1.4–6.5)
Neutrophils Relative %: 52 %
Platelets: 269 10*3/uL (ref 150–440)
RBC: 4.48 MIL/uL (ref 3.80–5.20)
RDW: 13.8 % (ref 11.5–14.5)
WBC: 6.1 10*3/uL (ref 3.6–11.0)

## 2016-12-12 LAB — TYPE AND SCREEN
ABO/RH(D): A NEG
Antibody Screen: NEGATIVE

## 2016-12-12 LAB — RAPID HIV SCREEN (HIV 1/2 AB+AG)
HIV 1/2 Antibodies: NONREACTIVE
HIV-1 P24 Antigen - HIV24: NONREACTIVE

## 2016-12-12 NOTE — H&P (Signed)
Subjective:  PREOPERATIVE HISTORY AND PHYSICAL  Date of surgery: 12/17/2016 Procedure: TVH BSO with anterior colporrhaphy Diagnoses: 1. Uterine procidentia with traction cystocele   Patient is a 66 y.o. G1P1026female scheduled forTVH BSO with anterior colporrhaphy for management of uterine procidentia with traction cystocele, scheduled for 12/17/2016.  Patient notes and a size bulge of a mass coming out of the vagina over the past 2 months. The mass presents itself when she is standing or straining; it reduces when she lies down. Patient has been seen by urology recent started on Flomax for difficulty voiding. Bowel function is reportedly normal. Office evaluation is notable for uterine procidentia with traction cystocele. Patient desires definitive surgical management.   Gynecologic History No LMP recorded. Patient is postmenopausal. Contraception: post menopausal status Last Pap: she states never. Results were: normal Last mammogram: 04/2014 birad 2 . Results were: normal  OB History    Gravida Para Term Preterm AB Living   1 1 1     1    SAB TAB Ectopic Multiple Live Births           1      Obstetric Comments   Son Passed at age 59 due to MVA      No LMP recorded. Patient is postmenopausal.    Past Medical History:  Diagnosis Date  . Anxiety   . Arthritis    lower back  . Depression   . GERD (gastroesophageal reflux disease)   . Hyperlipidemia   . Leaky heart valve    sees Dr Humphrey Rolls  . Panic attack   . Rectal fistula   . Wears dentures    full upper    Past Surgical History:  Procedure Laterality Date  . COLONOSCOPY  2014  . COLONOSCOPY WITH PROPOFOL N/A 10/01/2016   Procedure: COLONOSCOPY WITH PROPOFOL;  Surgeon: Lucilla Lame, MD;  Location: [place];  Service: Endoscopy;  Laterality: N/A;  . POLYPECTOMY  10/01/2016   Procedure: POLYPECTOMY;  Surgeon: Lucilla Lame, MD;  Location: [place];  Service: Endoscopy;;  . RECTAL SURGERY  06/28/2015    Rectal prolapse, laparoscopic rectopexy the coldCharmont, MD; [facility]  . TUBAL LIGATION      OB History  Gravida Para Term Preterm AB Living  1 1 1     1   SAB TAB Ectopic Multiple Live Births          1    # Outcome Date GA Lbr Len/2nd Weight Sex Delivery Anes PTL Lv  1 Term 1971   8 lb (3.629 kg) M Vag-Spont   LIV    Obstetric Comments  Son Passed at age 11 due to [place] History   Social History  . Marital status: Married    Spouse name: N/A  . Number of children: N/A  . Years of education: N/A   Social History Main Topics  . Smoking status: Current Every Day Smoker    Packs/day: 0.50    Years: 45.00    Types: Cigarettes  . Smokeless tobacco: Never Used  . Alcohol use No  . Drug use: No  . Sexual activity: Not Currently   Other Topics Concern  . None   Social History Narrative  . None    Family History  Problem Relation Age of Onset  . Diabetes Mother   . Diabetes Sister   . Diabetes Brother   . Lymphoma Brother   . Breast cancer Neg Hx   . Ovarian cancer Neg Hx   .  Colon cancer Neg Hx      (Not in a hospital admission)  No Known Allergies  Review of Systems Constitutional: No recent fever/chills/sweats Respiratory: No recent cough/bronchitis Cardiovascular: No chest pain Gastrointestinal: No recent nausea/vomiting/diarrhea Genitourinary: No UTI symptoms Hematologic/lymphatic:No history of coagulopathy or recent blood thinner use    Objective:    BP (!) 99/59   Pulse (!) 108   Ht 5\' 2"  (1.575 m)   Wt 167 lb 6.4 oz (75.9 kg)   BMI 30.62 kg/m   General:   Normal  Skin:   normal  HEENT:  Normal  Neck:  Supple without Adenopathy or Thyromegaly  Lungs:   Heart:              Breasts:   Abdomen:  Pelvis:  M/S   Extremeties:  Neuro:    clear to auscultation bilaterally   Normal without murmur   Not Examined   soft, non-tender; bowel sounds normal; no masses,  no organomegaly   Exam deferred to OR  No CVAT  Warm/Dry   Normal         10/31/2016 PELVIC:             External Genitalia: Normal             BUS: Normal             Vagina: Mild atrophic changes;traction cystocele             Cervix: Normal; parous; prolapsed to the introitus             Uterus: Normal; midplane, mobile, nontender, normal size and shape             Adnexa: Normal; nonpalpable and nontender             RV: External Exam NormaI, No Rectal Masses and Normal Sphincter tone   Assessment:    Uterine procidentia with traction cystocele   Plan:  TVH BSO with anterior colporrhaphy   preop counseling: The patient is to undergo TVH BSO with anterior colporrhaphy for management of uterine prolapse with traction cystocele. She is understanding of the planned procedure and is aware of and is accepting of all surgical risks which include but are not limited to bleeding, infection, pelvic organ injury with need for repair, blood clot disorders, anesthesia risks, etc. All questions have been answered. Informed consent is given. Patient is ready and willing to proceed with surgery as scheduled.  Brayton Mars, MD  Note: This dictation was prepared with Dragon dictation along with smaller phrase technology. Any transcriptional errors that result from this process are unintentional.

## 2016-12-12 NOTE — Patient Instructions (Signed)
Your procedure is scheduled on: Monday 12/17/16 Report to [place]. 2ND FLOOR MEDICAL MALL ENTRANCE. To find out your arrival time please call [phone] between 1PM - 3PM on Friday 12/14/16.  Remember: Instructions that are not followed completely may result in serious medical risk, up to and including death, or upon the discretion of your surgeon and anesthesiologist your surgery may need to be rescheduled.    __X__ 1. Do not eat food or drink liquids after midnight. No gum chewing or hard candies.     __X__ 2. No Alcohol for 24 hours before or after surgery.   ____ 3. Bring all medications with you on the day of surgery if instructed.    __X__ 4. Notify your doctor if there is any change in your medical condition     (cold, fever, infections).             __X___5. No smoking within 24 hours of your surgery.     Do not wear jewelry, make-up, hairpins, clips or nail polish.  Do not wear lotions, powders, or perfumes.   Do not shave 48 hours prior to surgery. Men may shave face and neck.  Do not bring valuables to the hospital.    [facility] is not responsible for any belongings or valuables.               Contacts, dentures or bridgework may not be worn into surgery.  Leave your suitcase in the car. After surgery it may be brought to your room.  For patients admitted to the hospital, discharge time is determined by your                treatment team.   Patients discharged the day of surgery will not be allowed to drive home.   Please read over the following fact sheets that you were given:   Pain Booklet and MRSA Information   __X__ Take these medicines the morning of surgery with A SIP OF WATER:    1. KLONOPIN IF NEEDED  2. HYDROCODONE IF NEEDED  3. OMEPRAZOLE  4.  5.  6.  ____ Fleet Enema (as directed)   __X__ Use CHG Soap as directed  ____ Use inhalers on the day of surgery  ____ Stop metformin 2 days prior to surgery    ____ Take 1/2 of usual insulin dose  the night before surgery and none on the morning of surgery.   __X__ Stop Coumadin/Plavix/aspirin on TODAY  __X__ Stop Anti-inflammatories such as Advil, Aleve, Ibuprofen, Motrin, Naproxen, Naprosyn, Goodies,powder, or aspirin products.  OK to take Tylenol.   ____ Stop supplements until after surgery.    ____ Bring C-Pap to the hospital.

## 2016-12-12 NOTE — Progress Notes (Signed)
Subjective:  PREOPERATIVE HISTORY AND PHYSICAL  Date of surgery: 12/17/2016 Procedure: TVH BSO with anterior colporrhaphy Diagnoses: 1. Uterine procidentia with traction cystocele   Patient is a 66 y.o. G1P1032female scheduled forTVH BSO with anterior colporrhaphy for management of uterine procidentia with traction cystocele, scheduled for 12/17/2016.  Patient notes and a size bulge of a mass coming out of the vagina over the past 2 months. The mass presents itself when she is standing or straining; it reduces when she lies down. Patient has been seen by urology recent started on Flomax for difficulty voiding. Bowel function is reportedly normal. Office evaluation is notable for uterine procidentia with traction cystocele. Patient desires definitive surgical management.   Gynecologic History No LMP recorded. Patient is postmenopausal. Contraception: post menopausal status Last Pap: she states never. Results were: normal Last mammogram: 04/2014 birad 2 . Results were: normal  OB History    Gravida Para Term Preterm AB Living   1 1 1     1    SAB TAB Ectopic Multiple Live Births           1      Obstetric Comments   Son Passed at age 48 due to MVA      No LMP recorded. Patient is postmenopausal.    Past Medical History:  Diagnosis Date  . Anxiety   . Arthritis    lower back  . Depression   . GERD (gastroesophageal reflux disease)   . Hyperlipidemia   . Leaky heart valve    sees Dr Humphrey Rolls  . Panic attack   . Rectal fistula   . Wears dentures    full upper    Past Surgical History:  Procedure Laterality Date  . COLONOSCOPY  2014  . COLONOSCOPY WITH PROPOFOL N/A 10/01/2016   Procedure: COLONOSCOPY WITH PROPOFOL;  Surgeon: Lucilla Lame, MD;  Location: [place];  Service: Endoscopy;  Laterality: N/A;  . POLYPECTOMY  10/01/2016   Procedure: POLYPECTOMY;  Surgeon: Lucilla Lame, MD;  Location: [place];  Service: Endoscopy;;  . RECTAL SURGERY  06/28/2015    Rectal prolapse, laparoscopic rectopexy the coldCharmont, MD; [facility]  . TUBAL LIGATION      OB History  Gravida Para Term Preterm AB Living  1 1 1     1   SAB TAB Ectopic Multiple Live Births          1    # Outcome Date GA Lbr Len/2nd Weight Sex Delivery Anes PTL Lv  1 Term 1971   8 lb (3.629 kg) M Vag-Spont   LIV    Obstetric Comments  Son Passed at age 27 due to [place] History   Social History  . Marital status: Married    Spouse name: N/A  . Number of children: N/A  . Years of education: N/A   Social History Main Topics  . Smoking status: Current Every Day Smoker    Packs/day: 0.50    Years: 45.00    Types: Cigarettes  . Smokeless tobacco: Never Used  . Alcohol use No  . Drug use: No  . Sexual activity: Not Currently   Other Topics Concern  . None   Social History Narrative  . None    Family History  Problem Relation Age of Onset  . Diabetes Mother   . Diabetes Sister   . Diabetes Brother   . Lymphoma Brother   . Breast cancer Neg Hx   . Ovarian cancer Neg Hx   .  Colon cancer Neg Hx      (Not in a hospital admission)  No Known Allergies  Review of Systems Constitutional: No recent fever/chills/sweats Respiratory: No recent cough/bronchitis Cardiovascular: No chest pain Gastrointestinal: No recent nausea/vomiting/diarrhea Genitourinary: No UTI symptoms Hematologic/lymphatic:No history of coagulopathy or recent blood thinner use    Objective:    BP (!) 99/59   Pulse (!) 108   Ht 5\' 2"  (1.575 m)   Wt 167 lb 6.4 oz (75.9 kg)   BMI 30.62 kg/m   General:   Normal  Skin:   normal  HEENT:  Normal  Neck:  Supple without Adenopathy or Thyromegaly  Lungs:   Heart:              Breasts:   Abdomen:  Pelvis:  M/S   Extremeties:  Neuro:    clear to auscultation bilaterally   Normal without murmur   Not Examined   soft, non-tender; bowel sounds normal; no masses,  no organomegaly   Exam deferred to OR  No CVAT  Warm/Dry   Normal         10/31/2016 PELVIC:             External Genitalia: Normal             BUS: Normal             Vagina: Mild atrophic changes;traction cystocele             Cervix: Normal; parous; prolapsed to the introitus             Uterus: Normal; midplane, mobile, nontender, normal size and shape             Adnexa: Normal; nonpalpable and nontender             RV: External Exam NormaI, No Rectal Masses and Normal Sphincter tone   Assessment:    Uterine procidentia with traction cystocele   Plan:  TVH BSO with anterior colporrhaphy   preop counseling: The patient is to undergo TVH BSO with anterior colporrhaphy for management of uterine prolapse with traction cystocele. She is understanding of the planned procedure and is aware of and is accepting of all surgical risks which include but are not limited to bleeding, infection, pelvic organ injury with need for repair, blood clot disorders, anesthesia risks, etc. All questions have been answered. Informed consent is given. Patient is ready and willing to proceed with surgery as scheduled.  Brayton Mars, MD  Note: This dictation was prepared with Dragon dictation along with smaller phrase technology. Any transcriptional errors that result from this process are unintentional.

## 2016-12-12 NOTE — Patient Instructions (Signed)
1. Return for a postop check 1 week after surgery

## 2016-12-13 LAB — SYPHILIS: RPR W/REFLEX TO RPR TITER AND TREPONEMAL ANTIBODIES, TRADITIONAL SCREENING AND DIAGNOSIS ALGORITHM: RPR Ser Ql: NONREACTIVE

## 2016-12-13 NOTE — Pre-Procedure Instructions (Signed)
  ECG 12 Lead9/20/2016 [facility] Component Name Value Ref Range  EKG Ventricular Rate 82 BPM   EKG Atrial Rate 82 BPM   EKG P-R Interval 160 ms  EKG QRS Duration 122 ms  EKG Q-T Interval 414 ms  EKG QTC Calculation 483 ms  EKG Calculated P Axis 52 degrees   EKG Calculated R Axis -7 degrees   EKG Calculated T Axis 138 degrees   Result Narrative  NORMAL SINUS RHYTHM LEFT BUNDLE BRANCH BLOCK ABNORMAL ECG NO PREVIOUS ECGS AVAILABLE Confirmed by SIMPSON MD, ROSS ([id number]) on 07/13/2015 1:44:27 AM  Status

## 2016-12-16 MED ORDER — CEFOXITIN SODIUM-DEXTROSE 2-2.2 GM-% IV SOLR (PREMIX)
2.0000 g | INTRAVENOUS | Status: AC
  Administered 2016-12-17: 2 g via INTRAVENOUS

## 2016-12-17 ENCOUNTER — Encounter: Payer: Self-pay | Admitting: *Deleted

## 2016-12-17 ENCOUNTER — Ambulatory Visit: Payer: Medicare Other | Admitting: Anesthesiology

## 2016-12-17 ENCOUNTER — Encounter
Admission: RE | Disposition: A | Discharge status: Home / Self Care | Payer: Self-pay | Source: Ambulatory Visit | Attending: Obstetrics and Gynecology

## 2016-12-17 ENCOUNTER — Observation Stay
Admission: RE | Admit: 2016-12-17 | Discharge: 2016-12-18 | Disposition: A | Discharge status: Home / Self Care | Payer: Medicare Other | Source: Ambulatory Visit | Attending: Obstetrics and Gynecology | Admitting: Obstetrics and Gynecology

## 2016-12-17 DIAGNOSIS — N8110 Cystocele, unspecified: Secondary | ICD-10-CM | POA: Diagnosis not present

## 2016-12-17 DIAGNOSIS — N72 Inflammatory disease of cervix uteri: Secondary | ICD-10-CM | POA: Insufficient documentation

## 2016-12-17 DIAGNOSIS — N813 Complete uterovaginal prolapse: Secondary | ICD-10-CM | POA: Diagnosis not present

## 2016-12-17 DIAGNOSIS — N812 Incomplete uterovaginal prolapse: Principal | ICD-10-CM | POA: Insufficient documentation

## 2016-12-17 DIAGNOSIS — N952 Postmenopausal atrophic vaginitis: Secondary | ICD-10-CM | POA: Diagnosis not present

## 2016-12-17 DIAGNOSIS — G4730 Sleep apnea, unspecified: Secondary | ICD-10-CM | POA: Diagnosis not present

## 2016-12-17 DIAGNOSIS — N838 Other noninflammatory disorders of ovary, fallopian tube and broad ligament: Secondary | ICD-10-CM | POA: Insufficient documentation

## 2016-12-17 DIAGNOSIS — F329 Major depressive disorder, single episode, unspecified: Secondary | ICD-10-CM | POA: Insufficient documentation

## 2016-12-17 DIAGNOSIS — Z90710 Acquired absence of both cervix and uterus: Secondary | ICD-10-CM | POA: Diagnosis not present

## 2016-12-17 DIAGNOSIS — K219 Gastro-esophageal reflux disease without esophagitis: Secondary | ICD-10-CM | POA: Insufficient documentation

## 2016-12-17 DIAGNOSIS — M479 Spondylosis, unspecified: Secondary | ICD-10-CM | POA: Insufficient documentation

## 2016-12-17 DIAGNOSIS — N83292 Other ovarian cyst, left side: Secondary | ICD-10-CM | POA: Insufficient documentation

## 2016-12-17 DIAGNOSIS — F419 Anxiety disorder, unspecified: Secondary | ICD-10-CM | POA: Insufficient documentation

## 2016-12-17 DIAGNOSIS — E785 Hyperlipidemia, unspecified: Secondary | ICD-10-CM | POA: Insufficient documentation

## 2016-12-17 DIAGNOSIS — N840 Polyp of corpus uteri: Secondary | ICD-10-CM | POA: Insufficient documentation

## 2016-12-17 DIAGNOSIS — F17210 Nicotine dependence, cigarettes, uncomplicated: Secondary | ICD-10-CM | POA: Diagnosis not present

## 2016-12-17 DIAGNOSIS — Z7982 Long term (current) use of aspirin: Secondary | ICD-10-CM | POA: Diagnosis not present

## 2016-12-17 DIAGNOSIS — N83291 Other ovarian cyst, right side: Secondary | ICD-10-CM | POA: Insufficient documentation

## 2016-12-17 HISTORY — PX: VAGINAL HYSTERECTOMY: SHX2639

## 2016-12-17 HISTORY — PX: CYSTOCELE REPAIR: SHX163

## 2016-12-17 LAB — ABO/RH: ABO/RH(D): A NEG

## 2016-12-17 SURGERY — HYSTERECTOMY, VAGINAL
Anesthesia: Monitor Anesthesia Care

## 2016-12-17 MED ORDER — HYDROMORPHONE HCL 1 MG/ML IJ SOLN
INTRAMUSCULAR | Status: AC
  Filled 2016-12-17: qty 1

## 2016-12-17 MED ORDER — PROPOFOL 10 MG/ML IV BOLUS
INTRAVENOUS | Status: AC
  Filled 2016-12-17: qty 20

## 2016-12-17 MED ORDER — ACETAMINOPHEN 325 MG PO TABS: 650.0000 mg | ORAL_TABLET | ORAL | Status: DC | PRN

## 2016-12-17 MED ORDER — ACETAMINOPHEN 325 MG PO TABS
325.0000 mg | ORAL_TABLET | ORAL | Status: DC | PRN
Start: 2016-12-17 — End: 2016-12-17

## 2016-12-17 MED ORDER — OXYCODONE-ACETAMINOPHEN 5-325 MG PO TABS
1.0000 | ORAL_TABLET | ORAL | Status: DC | PRN
  Administered 2016-12-18: 1 via ORAL
  Filled 2016-12-17: qty 1

## 2016-12-17 MED ORDER — MORPHINE SULFATE (PF) 2 MG/ML IV SOLN
1.0000 mg | INTRAVENOUS | Status: DC | PRN
  Administered 2016-12-17 – 2016-12-18 (×5): 2 mg via INTRAVENOUS
  Filled 2016-12-17 (×3): qty 1

## 2016-12-17 MED ORDER — LIDOCAINE HCL (CARDIAC) 20 MG/ML IV SOLN
INTRAVENOUS | Status: DC | PRN
  Administered 2016-12-17: 100 mg via INTRAVENOUS

## 2016-12-17 MED ORDER — ACETAMINOPHEN 10 MG/ML IV SOLN
INTRAVENOUS | Status: DC | PRN
  Administered 2016-12-17: 1000 mg via INTRAVENOUS

## 2016-12-17 MED ORDER — ACETAMINOPHEN 10 MG/ML IV SOLN
INTRAVENOUS | Status: AC
  Filled 2016-12-17: qty 100

## 2016-12-17 MED ORDER — ESTROGENS, CONJUGATED 0.625 MG/GM VA CREA
TOPICAL_CREAM | VAGINAL | Status: DC | PRN
  Administered 2016-12-17: 1 via VAGINAL

## 2016-12-17 MED ORDER — ROCURONIUM BROMIDE 50 MG/5ML IV SOLN
INTRAVENOUS | Status: AC
  Filled 2016-12-17: qty 1

## 2016-12-17 MED ORDER — LACTATED RINGERS IV SOLN
INTRAVENOUS | Status: DC
  Administered 2016-12-17: 08:00:00 via INTRAVENOUS

## 2016-12-17 MED ORDER — FENTANYL CITRATE (PF) 100 MCG/2ML IJ SOLN
INTRAMUSCULAR | Status: DC | PRN
  Administered 2016-12-17 (×2): 50 ug via INTRAVENOUS

## 2016-12-17 MED ORDER — FENTANYL CITRATE (PF) 250 MCG/5ML IJ SOLN
INTRAMUSCULAR | Status: AC
  Filled 2016-12-17: qty 5

## 2016-12-17 MED ORDER — KETOROLAC TROMETHAMINE 30 MG/ML IJ SOLN
INTRAMUSCULAR | Status: DC | PRN
  Administered 2016-12-17: 30 mg via INTRAVENOUS

## 2016-12-17 MED ORDER — ONDANSETRON HCL 4 MG/2ML IJ SOLN: 4.0000 mg | Freq: Once | INTRAMUSCULAR | Status: DC | PRN

## 2016-12-17 MED ORDER — ONDANSETRON HCL 4 MG/2ML IJ SOLN
4.0000 mg | Freq: Four times a day (QID) | INTRAMUSCULAR | Status: DC | PRN
  Administered 2016-12-17: 4 mg via INTRAVENOUS
  Filled 2016-12-17: qty 2

## 2016-12-17 MED ORDER — SUGAMMADEX SODIUM 200 MG/2ML IV SOLN
INTRAVENOUS | Status: DC | PRN
Start: 2016-12-17 — End: 2016-12-17
  Administered 2016-12-17: 200 mg via INTRAVENOUS

## 2016-12-17 MED ORDER — HYDROMORPHONE HCL 1 MG/ML IJ SOLN
0.5000 mg | INTRAMUSCULAR | Status: AC | PRN
  Administered 2016-12-17 (×4): 0.5 mg via INTRAVENOUS

## 2016-12-17 MED ORDER — ONDANSETRON HCL 4 MG/2ML IJ SOLN
INTRAMUSCULAR | Status: AC
  Filled 2016-12-17: qty 2

## 2016-12-17 MED ORDER — ESTROGENS, CONJUGATED 0.625 MG/GM VA CREA
TOPICAL_CREAM | VAGINAL | Status: AC
  Filled 2016-12-17: qty 30

## 2016-12-17 MED ORDER — ACETAMINOPHEN 160 MG/5ML PO SOLN: 325.0000 mg | ORAL | Status: DC | PRN

## 2016-12-17 MED ORDER — KETOROLAC TROMETHAMINE 30 MG/ML IJ SOLN: 30.0000 mg | Freq: Four times a day (QID) | INTRAMUSCULAR | Status: DC

## 2016-12-17 MED ORDER — FENTANYL CITRATE (PF) 100 MCG/2ML IJ SOLN
INTRAMUSCULAR | Status: AC
  Filled 2016-12-17: qty 2

## 2016-12-17 MED ORDER — KETOROLAC TROMETHAMINE 30 MG/ML IJ SOLN
30.0000 mg | Freq: Four times a day (QID) | INTRAMUSCULAR | Status: DC
  Administered 2016-12-17 – 2016-12-18 (×4): 30 mg via INTRAVENOUS
  Filled 2016-12-17 (×3): qty 1

## 2016-12-17 MED ORDER — FENTANYL CITRATE (PF) 100 MCG/2ML IJ SOLN
25.0000 ug | INTRAMUSCULAR | Status: DC | PRN
  Administered 2016-12-17 (×4): 25 ug via INTRAVENOUS

## 2016-12-17 MED ORDER — ROCURONIUM BROMIDE 100 MG/10ML IV SOLN
INTRAVENOUS | Status: DC | PRN
  Administered 2016-12-17: 10 mg via INTRAVENOUS
  Administered 2016-12-17: 50 mg via INTRAVENOUS
  Administered 2016-12-17: 10 mg via INTRAVENOUS

## 2016-12-17 MED ORDER — CEFOXITIN SODIUM-DEXTROSE 2-2.2 GM-% IV SOLR (PREMIX)
INTRAVENOUS | Status: AC
  Filled 2016-12-17: qty 50

## 2016-12-17 MED ORDER — LACTATED RINGERS IV SOLN
INTRAVENOUS | Status: DC
  Administered 2016-12-17 (×2): via INTRAVENOUS

## 2016-12-17 MED ORDER — MIDAZOLAM HCL 2 MG/2ML IJ SOLN
INTRAMUSCULAR | Status: AC
  Filled 2016-12-17: qty 2

## 2016-12-17 MED ORDER — ONDANSETRON HCL 4 MG/2ML IJ SOLN
INTRAMUSCULAR | Status: DC | PRN
  Administered 2016-12-17: 4 mg via INTRAVENOUS

## 2016-12-17 MED ORDER — DOCUSATE SODIUM 100 MG PO CAPS
100.0000 mg | ORAL_CAPSULE | Freq: Two times a day (BID) | ORAL | Status: DC
  Administered 2016-12-17 – 2016-12-18 (×2): 100 mg via ORAL
  Filled 2016-12-17: qty 1

## 2016-12-17 MED ORDER — DEXAMETHASONE SODIUM PHOSPHATE 10 MG/ML IJ SOLN
INTRAMUSCULAR | Status: DC | PRN
  Administered 2016-12-17: 10 mg via INTRAVENOUS

## 2016-12-17 SURGICAL SUPPLY — 33 items
BAG URO DRAIN 2000ML W/SPOUT (MISCELLANEOUS) ×3 IMPLANT
CANISTER SUCT 1200ML W/VALVE (MISCELLANEOUS) ×3 IMPLANT
CATH FOLEY 2WAY  5CC 16FR (CATHETERS) ×1
CATH URTH 16FR FL 2W BLN LF (CATHETERS) ×2 IMPLANT
COUNTER NEEDLE 20/40 LG (NEEDLE) ×3 IMPLANT
DRAPE PERI LITHO V/GYN (MISCELLANEOUS) ×3 IMPLANT
DRAPE SHEET LG 3/4 BI-LAMINATE (DRAPES) ×3 IMPLANT
DRAPE UNDER BUTTOCK W/FLU (DRAPES) ×3 IMPLANT
ELECT REM PT RETURN 9FT ADLT (ELECTROSURGICAL) ×3
ELECTRODE REM PT RTRN 9FT ADLT (ELECTROSURGICAL) ×2 IMPLANT
GAUZE PACK 2X3YD (MISCELLANEOUS) ×3 IMPLANT
GLOVE BIO SURGEON STRL SZ8 (GLOVE) ×12 IMPLANT
GLOVE INDICATOR 8.0 STRL GRN (GLOVE) ×12 IMPLANT
GOWN STRL REUS W/ TWL LRG LVL3 (GOWN DISPOSABLE) ×4 IMPLANT
GOWN STRL REUS W/ TWL XL LVL3 (GOWN DISPOSABLE) ×4 IMPLANT
GOWN STRL REUS W/TWL LRG LVL3 (GOWN DISPOSABLE) ×2
GOWN STRL REUS W/TWL XL LVL3 (GOWN DISPOSABLE) ×2
KIT RM TURNOVER CYSTO AR (KITS) ×3 IMPLANT
LABEL OR SOLS (LABEL) ×3 IMPLANT
NS IRRIG 500ML POUR BTL (IV SOLUTION) ×3 IMPLANT
PACK BASIN MINOR ARMC (MISCELLANEOUS) ×3 IMPLANT
PAD OB MATERNITY 4.3X12.25 (PERSONAL CARE ITEMS) ×3 IMPLANT
PAD PREP 24X41 OB/GYN DISP (PERSONAL CARE ITEMS) ×3 IMPLANT
SPONGE XRAY 4X4 16PLY STRL (MISCELLANEOUS) ×3 IMPLANT
SUT CHROMIC 0 CT 1 (SUTURE) ×6 IMPLANT
SUT CHROMIC 1-0 (SUTURE) ×3 IMPLANT
SUT CHROMIC 2 0 CT 1 (SUTURE) ×9 IMPLANT
SUT VIC AB 0 CT1 27 (SUTURE) ×1
SUT VIC AB 0 CT1 27XCR 8 STRN (SUTURE) ×2 IMPLANT
SUT VIC AB 0 CT1 36 (SUTURE) ×3 IMPLANT
SUT VIC AB 0 CT2 27 (SUTURE) ×6 IMPLANT
SUT VIC AB 2-0 UR6 27 (SUTURE) ×6 IMPLANT
SYRINGE 10CC LL (SYRINGE) ×3 IMPLANT

## 2016-12-17 NOTE — Anesthesia Postprocedure Evaluation (Signed)
Anesthesia Post Note  Patient: Kim Buchanan  Procedure(s) Performed: Procedure(s) (LRB): HYSTERECTOMY VAGINAL WITH BILATERAL SALPINGO OOPHERECTOMY (Bilateral) ANTERIOR REPAIR (CYSTOCELE) (N/A)  Patient location during evaluation: PACU Anesthesia Type: General Level of consciousness: awake and alert and oriented Pain management: pain level controlled Vital Signs Assessment: post-procedure vital signs reviewed and stable Respiratory status: spontaneous breathing Cardiovascular status: blood pressure returned to baseline Anesthetic complications: no     Last Vitals:  Vitals:   12/17/16 1047 12/17/16 1057  BP: (!) 123/106   Pulse: 80 80  Resp: (!) 21 18  Temp: 36.3 C     Last Pain:  Vitals:   12/17/16 1057  TempSrc:   PainSc: 7                  Caysen Whang

## 2016-12-17 NOTE — Transfer of Care (Signed)
Immediate Anesthesia Transfer of Care Note  Patient: Kim Buchanan  Procedure(s) Performed: Procedure(s): HYSTERECTOMY VAGINAL WITH BILATERAL SALPINGO OOPHERECTOMY (Bilateral) ANTERIOR REPAIR (CYSTOCELE) (N/A)  Patient Location: PACU  Anesthesia Type:General  Level of Consciousness: sedated  Airway & Oxygen Therapy: Patient Spontanous Breathing and Patient connected to face mask oxygen  Post-op Assessment: Report given to RN and Post -op Vital signs reviewed and stable  Post vital signs: Reviewed and stable  Last Vitals:  Vitals:   12/17/16 0643 12/17/16 0945  BP: (!) 171/94   Pulse: 81 (P) 77  Resp: 18 (!) (P) 24  Temp: 36.5 C (P) 36.6 C    Last Pain:  Vitals:   12/17/16 0643  TempSrc: Tympanic         Complications: No apparent anesthesia complications

## 2016-12-17 NOTE — Anesthesia Preprocedure Evaluation (Signed)
Anesthesia Evaluation  Patient identified by MRN, date of birth, ID band Patient awake    Reviewed: Allergy & Precautions, H&P , NPO status , Patient's Chart, lab work & pertinent test results  Airway Mallampati: II  TM Distance: >3 FB Neck ROM: full    Dental no notable dental hx. (+) Upper Dentures, Chipped, Poor Dentition   Pulmonary sleep apnea , Current Smoker,    Pulmonary exam normal        Cardiovascular negative cardio ROS Normal cardiovascular exam     Neuro/Psych PSYCHIATRIC DISORDERS Anxiety Depression    GI/Hepatic Neg liver ROS, GERD  ,  Endo/Other  negative endocrine ROS  Renal/GU negative Renal ROS  negative genitourinary   Musculoskeletal  (+) Arthritis , Osteoarthritis,    Abdominal   Peds negative pediatric ROS (+)  Hematology negative hematology ROS (+)   Anesthesia Other Findings Past Medical History: No date: Anxiety No date: Arthritis     Comment: lower back No date: Depression No date: GERD (gastroesophageal reflux disease) No date: Hyperlipidemia No date: Leaky heart valve     Comment: sees Dr Humphrey Rolls No date: Panic attack No date: Rectal fistula No date: Sleep apnea No date: Wears dentures     Comment: full upper  Reproductive/Obstetrics                             Anesthesia Physical  Anesthesia Plan  ASA: II  Anesthesia Plan: MAC   Post-op Pain Management:    Induction:   Airway Management Planned:   Additional Equipment:   Intra-op Plan:   Post-operative Plan:   Informed Consent: I have reviewed the patients History and Physical, chart, labs and discussed the procedure including the risks, benefits and alternatives for the proposed anesthesia with the patient or authorized representative who has indicated his/her understanding and acceptance.     Plan Discussed with:   Anesthesia Plan Comments:         Anesthesia Quick  Evaluation

## 2016-12-17 NOTE — Anesthesia Procedure Notes (Signed)
Procedure Name: Intubation Date/Time: 12/17/2016 7:49 AM Performed by: Just[facility] Pre-anesthesia Checklist: Patient identified and Emergency Drugs available Patient Re-evaluated:Patient Re-evaluated prior to inductionOxygen Delivery Method: Circle system utilized Preoxygenation: Pre-oxygenation with 100% oxygen Intubation Type: IV induction Ventilation: Mask ventilation without difficulty Laryngoscope Size: Mac and 3 Grade View: Grade I Tube type: Oral Laser Tube: Cuffed inflated with minimal occlusive pressure - saline Tube size: 7.0 mm Number of attempts: 1 Airway Equipment and Method: Stylet Placement Confirmation: ETT inserted through vocal cords under direct vision,  positive ETCO2,  CO2 detector and breath sounds checked- equal and bilateral Secured at: 21 cm Tube secured with: Tape Dental Injury: Teeth and Oropharynx as per pre-operative assessment  Future Recommendations: Recommend- induction with short-acting agent, and alternative techniques readily available

## 2016-12-17 NOTE — Interval H&P Note (Signed)
History and Physical Interval Note:  12/17/2016 7:28 AM  Kim Buchanan  has presented today for surgery, with the diagnosis of UTERINE PROLAPSE, CYSTOCELE, VAGINAL ATROPHY  The various methods of treatment have been discussed with the patient and family. After consideration of risks, benefits and other options for treatment, the patient has consented to  Procedure(s): HYSTERECTOMY VAGINAL WITH BILATERAL SALPINGO OOPHERECTOMY (Bilateral) ANTERIOR REPAIR (CYSTOCELE) (N/A) as a surgical intervention .  The patient's history has been reviewed, patient examined, no change in status, stable for surgery.  I have reviewed the patient's chart and labs.  Questions were answered to the patient's satisfaction.     Hassell Done A Evangelina Delancey

## 2016-12-17 NOTE — H&P (View-Only) (Signed)
Subjective:  PREOPERATIVE HISTORY AND PHYSICAL  Date of surgery: 12/17/2016 Procedure: TVH BSO with anterior colporrhaphy Diagnoses: 1. Uterine procidentia with traction cystocele   Patient is a 66 y.o. G1P1044female scheduled forTVH BSO with anterior colporrhaphy for management of uterine procidentia with traction cystocele, scheduled for 12/17/2016.  Patient notes and a size bulge of a mass coming out of the vagina over the past 2 months. The mass presents itself when she is standing or straining; it reduces when she lies down. Patient has been seen by urology recent started on Flomax for difficulty voiding. Bowel function is reportedly normal. Office evaluation is notable for uterine procidentia with traction cystocele. Patient desires definitive surgical management.   Gynecologic History No LMP recorded. Patient is postmenopausal. Contraception: post menopausal status Last Pap: she states never. Results were: normal Last mammogram: 04/2014 birad 2 . Results were: normal  OB History    Gravida Para Term Preterm AB Living   1 1 1     1    SAB TAB Ectopic Multiple Live Births           1      Obstetric Comments   Son Passed at age 61 due to MVA      No LMP recorded. Patient is postmenopausal.    Past Medical History:  Diagnosis Date  . Anxiety   . Arthritis    lower back  . Depression   . GERD (gastroesophageal reflux disease)   . Hyperlipidemia   . Leaky heart valve    sees Dr Humphrey Rolls  . Panic attack   . Rectal fistula   . Wears dentures    full upper    Past Surgical History:  Procedure Laterality Date  . COLONOSCOPY  2014  . COLONOSCOPY WITH PROPOFOL N/A 10/01/2016   Procedure: COLONOSCOPY WITH PROPOFOL;  Surgeon: Lucilla Lame, MD;  Location: [place];  Service: Endoscopy;  Laterality: N/A;  . POLYPECTOMY  10/01/2016   Procedure: POLYPECTOMY;  Surgeon: Lucilla Lame, MD;  Location: [place];  Service: Endoscopy;;  . RECTAL SURGERY  06/28/2015    Rectal prolapse, laparoscopic rectopexy the coldCharmont, MD; [facility]  . TUBAL LIGATION      OB History  Gravida Para Term Preterm AB Living  1 1 1     1   SAB TAB Ectopic Multiple Live Births          1    # Outcome Date GA Lbr Len/2nd Weight Sex Delivery Anes PTL Lv  1 Term 1971   8 lb (3.629 kg) M Vag-Spont   LIV    Obstetric Comments  Son Passed at age 63 due to [place] History   Social History  . Marital status: Married    Spouse name: N/A  . Number of children: N/A  . Years of education: N/A   Social History Main Topics  . Smoking status: Current Every Day Smoker    Packs/day: 0.50    Years: 45.00    Types: Cigarettes  . Smokeless tobacco: Never Used  . Alcohol use No  . Drug use: No  . Sexual activity: Not Currently   Other Topics Concern  . None   Social History Narrative  . None    Family History  Problem Relation Age of Onset  . Diabetes Mother   . Diabetes Sister   . Diabetes Brother   . Lymphoma Brother   . Breast cancer Neg Hx   . Ovarian cancer Neg Hx   .  Colon cancer Neg Hx      (Not in a hospital admission)  No Known Allergies  Review of Systems Constitutional: No recent fever/chills/sweats Respiratory: No recent cough/bronchitis Cardiovascular: No chest pain Gastrointestinal: No recent nausea/vomiting/diarrhea Genitourinary: No UTI symptoms Hematologic/lymphatic:No history of coagulopathy or recent blood thinner use    Objective:    BP (!) 99/59   Pulse (!) 108   Ht 5\' 2"  (1.575 m)   Wt 167 lb 6.4 oz (75.9 kg)   BMI 30.62 kg/m   General:   Normal  Skin:   normal  HEENT:  Normal  Neck:  Supple without Adenopathy or Thyromegaly  Lungs:   Heart:              Breasts:   Abdomen:  Pelvis:  M/S   Extremeties:  Neuro:    clear to auscultation bilaterally   Normal without murmur   Not Examined   soft, non-tender; bowel sounds normal; no masses,  no organomegaly   Exam deferred to OR  No CVAT  Warm/Dry   Normal         10/31/2016 PELVIC:             External Genitalia: Normal             BUS: Normal             Vagina: Mild atrophic changes;traction cystocele             Cervix: Normal; parous; prolapsed to the introitus             Uterus: Normal; midplane, mobile, nontender, normal size and shape             Adnexa: Normal; nonpalpable and nontender             RV: External Exam NormaI, No Rectal Masses and Normal Sphincter tone   Assessment:    Uterine procidentia with traction cystocele   Plan:  TVH BSO with anterior colporrhaphy   preop counseling: The patient is to undergo TVH BSO with anterior colporrhaphy for management of uterine prolapse with traction cystocele. She is understanding of the planned procedure and is aware of and is accepting of all surgical risks which include but are not limited to bleeding, infection, pelvic organ injury with need for repair, blood clot disorders, anesthesia risks, etc. All questions have been answered. Informed consent is given. Patient is ready and willing to proceed with surgery as scheduled.  Brayton Mars, MD  Note: This dictation was prepared with Dragon dictation along with smaller phrase technology. Any transcriptional errors that result from this process are unintentional.

## 2016-12-17 NOTE — Anesthesia Post-op Follow-up Note (Cosign Needed)
Anesthesia QCDR form completed.        

## 2016-12-18 DIAGNOSIS — N72 Inflammatory disease of cervix uteri: Secondary | ICD-10-CM | POA: Diagnosis not present

## 2016-12-18 DIAGNOSIS — E785 Hyperlipidemia, unspecified: Secondary | ICD-10-CM | POA: Diagnosis not present

## 2016-12-18 DIAGNOSIS — K219 Gastro-esophageal reflux disease without esophagitis: Secondary | ICD-10-CM | POA: Diagnosis not present

## 2016-12-18 DIAGNOSIS — N840 Polyp of corpus uteri: Secondary | ICD-10-CM | POA: Diagnosis not present

## 2016-12-18 DIAGNOSIS — N952 Postmenopausal atrophic vaginitis: Secondary | ICD-10-CM | POA: Diagnosis not present

## 2016-12-18 DIAGNOSIS — Z7982 Long term (current) use of aspirin: Secondary | ICD-10-CM | POA: Diagnosis not present

## 2016-12-18 DIAGNOSIS — N813 Complete uterovaginal prolapse: Secondary | ICD-10-CM | POA: Diagnosis not present

## 2016-12-18 DIAGNOSIS — M479 Spondylosis, unspecified: Secondary | ICD-10-CM | POA: Diagnosis not present

## 2016-12-18 DIAGNOSIS — G4730 Sleep apnea, unspecified: Secondary | ICD-10-CM | POA: Diagnosis not present

## 2016-12-18 LAB — SURGICAL PATHOLOGY

## 2016-12-18 LAB — HEMOGLOBIN: Hemoglobin: 14.1 g/dL (ref 12.0–16.0)

## 2016-12-18 MED ORDER — DOCUSATE SODIUM 100 MG PO CAPS: 100.0000 mg | ORAL_CAPSULE | Freq: Two times a day (BID) | ORAL | 0 refills | Status: DC

## 2016-12-18 MED ORDER — IBUPROFEN 800 MG PO TABS: 800.0000 mg | ORAL_TABLET | Freq: Three times a day (TID) | ORAL | 1 refills | Status: DC

## 2016-12-18 MED ORDER — OXYCODONE-ACETAMINOPHEN 5-325 MG PO TABS: 1.0000 | ORAL_TABLET | ORAL | 0 refills | Status: DC | PRN

## 2016-12-18 NOTE — Discharge Summary (Signed)
Physician Discharge Summary  Patient ID: Kim Buchanan MRN: [id number] DOB/AGE: 64-28-1952 64 y.o.  Admit date: 12/17/2016 Discharge date: 12/18/2016  Admission Diagnoses: Pelvic Organ Prolapse  Discharge Diagnoses:  S/P TVHBSO with Anterior Colporrhaphy and Pelvic Organ Prolapse  Operative Procedures: Procedure(s): HYSTERECTOMY VAGINAL WITH BILATERAL SALPINGO OOPHERECTOMY (Bilateral) ANTERIOR REPAIR (CYSTOCELE) (N/A)  Hospital Course: Uncomplicated.   Significant Diagnostic Studies:  Lab Results  Component Value Date   HGB 14.1 12/18/2016   HGB 13.9 12/12/2016   HGB 14.0 02/24/2013   Lab Results  Component Value Date   HCT 40.1 12/12/2016   HCT 38.8 03/22/2016   HCT 41.0 02/24/2013   CBC Latest Ref Rng & Units 12/18/2016 12/12/2016 03/22/2016  WBC 3.6 - 11.0 K/uL - 6.1 7.6  Hemoglobin 12.0 - 16.0 g/dL 14.1 13.9 -  Hematocrit 35.0 - 47.0 % - 40.1 38.8  Platelets 150 - 440 K/uL - 269 233     Discharged Condition: good  Discharge Exam: Blood pressure 129/86, pulse (!) 106, temperature 98.7 F (37.1 C), temperature source Oral, resp. rate 18, height 5\' 2"  (1.575 m), weight 167 lb (75.8 kg), SpO2 97 %. Incision/Wound: no vaginal bleeding  Disposition: 01-Home or Self Care  Discharge Instructions    Discharge patient    Complete by:  As directed    Discharge after lunch if voiding and tolerating diet.   Discharge disposition:  01-Home or Self Care   Discharge patient date:  12/18/2016     Allergies as of 12/18/2016   No Known Allergies     Medication List    STOP taking these medications   estradiol 0.1 MG/GM vaginal cream Commonly known as:  ESTRACE VAGINAL   HYDROcodone-acetaminophen 10-325 MG tablet Commonly known as:  NORCO     TAKE these medications   aspirin 81 MG tablet Take 81 mg by mouth daily.   clonazePAM 1 MG tablet Commonly known as:  KLONOPIN Take 1 tablet (1 mg total) by mouth 3 (three) times daily as needed for anxiety.   docusate  sodium 100 MG capsule Commonly known as:  COLACE Take 1 capsule (100 mg total) by mouth 2 (two) times daily.   ibuprofen 800 MG tablet Commonly known as:  ADVIL,MOTRIN Take 1 tablet (800 mg total) by mouth 3 (three) times daily.   omeprazole 40 MG capsule Commonly known as:  PRILOSEC Take 1 capsule (40 mg total) by mouth daily.   oxyCODONE-acetaminophen 5-325 MG tablet Commonly known as:  PERCOCET/ROXICET Take 1-2 tablets by mouth every 4 (four) hours as needed (moderate to severe pain (when tolerating fluids)).   polyethylene glycol packet Commonly known as:  MIRALAX / GLYCOLAX Take 17 g by mouth daily.   senna 8.6 MG Tabs tablet Commonly known as:  SENOKOT Take 2 tablets by mouth daily as needed for mild constipation.   venlafaxine XR 75 MG 24 hr capsule Commonly known as:  EFFEXOR-XR Take 1 capsule (75 mg total) by mouth at bedtime. What changed:  additional instructions   venlafaxine XR 150 MG 24 hr capsule Commonly known as:  EFFEXOR-XR Take 1 capsule (150 mg total) by mouth at bedtime. What changed:  additional instructions      Follow-up Information    Brayton Mars, MD. Go in 1 week(s).   Specialties:  Obstetrics and Gynecology, Radiology Why:  Post op Appointment Contact information: [address] [place] [address] [phone]           Signed: Alanda Slim Bilaal Leib 12/18/2016,  7:40 AM

## 2016-12-18 NOTE — Discharge Instructions (Signed)
Please call your doctor or return to the ER if you experience any chest pains, shortness of breath, visual changes, fever greater than 101, any heavy bleeding, large clots, or foul smelling discharge, or any worsening abdominal pain and cramping that is not controlled by pain medication. No tampons, enemas, douches, or sexual intercourse for 6 weeks. Also avoid tub baths, hot tubs, or swimming for 6 weeks.    Do not drive while taking narcotic pain medication   Activity: do not lift >10 lbs for 6 weeks

## 2016-12-18 NOTE — Op Note (Addendum)
OPERATIVE NOTE:  Kim Buchanan PROCEDURE DATE: 12/17/2016   PREOPERATIVE DIAGNOSIS:  1. Pelvic organ prolapse (uterine prolapse; second-degree cystocele)  POSTOPERATIVE DIAGNOSIS:  1. Pelvic organ prolapse (uterine prolapse; second-degree cystocele)  PROCEDURE:  Transvaginal hysterectomy bilateral salpingo-oophorectomy with anterior colporrhaphy   SURGEON:  Brayton Mars, MD ASSISTANTS: NP-S Evette Georges and Dr. Marcelline Mates ANESTHESIA: General INDICATIONS: 66 y.o. G1P1001 with symptomatic pelvic relaxation including a traction cystocele with uterine prolapse presents for surgical management  FINDINGS:  Uterine prolapse traction cystocele second-degree; grossly normal tubes and ovaries   I/O's: Estimated blood loss 200 mL; urine output: Clear; see anesthesia record; IV fluids: See anesthesia record  COUNTS:  YES SPECIMENS: uterus with cervix, Bilateral fallopian tubes and ovaries ANTIBIOTIC PROPHYLAXIS:Cefoxitin 2 g COMPLICATIONS: None immediate  PROCEDURE IN DETAIL: Patient was brought to the operating room and placed in the supine position. General endotracheal anesthesia was induced without difficulty. She was placed in the dorsal lithotomy position using the candycane stirrups. A ChloraPrep and Hibiclens abdominal perineal intravaginal prep and drape was performed in a fashion. Timeout was completed. Weighted speculum was placed in the vagina. Double-tooth tenaculum was placed on the cervix. Posterior colpotomy was made with Mayo scissors. Uterosacral ligaments were clamped cut and stick tied using 0 Vicryl suture. These were tagged. The vagina was circumscribed anteriorly and to dissect the vagina and bladder off the lower uterine segment; eventually the anterior cul-de-sac was entered. Sequentially the cardinal broad ligament complexes were clamped cut and stick tied using 0 Vicryl suture. This was carried out to the level of the uterine cornua. At this point the  utero-ovarian ligaments were clamped cut the specimen was removed from the operative field. The pedicles were sutured with 0 Vicryl suture. Sequentially then each adnexal structures were then grasped with Babcock clamps. The endopelvic ligaments were crossclamped and cut and stick tied using 0 Vicryl suture. An initial free tie was placed followed by a stick tie. Good hemostasis was noted. The peritoneum was reapproximated using a pursestring stitch of 0 Vicryl suture. The posterior cuff was run using a baseball stitch of 0 Vicryl suture. Anterior colporrhaphy was performed in standard fashion. Allis clamps were used to grasp the lateral margins of the vaginal mucosa at the bladder base. The vaginal mucosa was undermined with Metzenbaum scissors and incised in the midline with Metzenbaum scissors. Allis Adair clamps were used to grasp the vaginal mucosa to help facilitate dissection. This process was carried out to within 3 cm of the urethral meatus. The perivesical fascia was dissected off of the vagina through sharp and blunt dissection. The mobilize bladder was then reduced using vertical mattress sutures of 2-0 Vicryl. Once the cystocele was reduced, excess vaginal mucosa was trimmed. The vagina was then reapproximated in the midline using 20 simple interrupted sutures of chromic. Good depth to the vagina was noted following completion of the procedure. The vagina was then packed with Kerlix gauze coated with Premarin cream. Following completion of the procedure all instrumentation was removed from the vagina. Patient was awakened mobilized and taken to recovery in satisfactory condition.  Kim Buchanan A. Kim Plants, MD, [facility]

## 2016-12-18 NOTE — Progress Notes (Signed)
Discharge order received from doctor.  Reviewed discharge instructions and prescriptions with patient and answered all questions. Follow up appointment given. Patient verbalized understanding. Patient discharged home via wheelchair by nursing/auxillary.    Kim Dombek Garner, RN  

## 2016-12-25 ENCOUNTER — Encounter: Payer: Medicare Other | Admitting: Obstetrics and Gynecology

## 2016-12-27 ENCOUNTER — Ambulatory Visit (INDEPENDENT_AMBULATORY_CARE_PROVIDER_SITE_OTHER): Payer: Medicare Other | Admitting: Family Medicine

## 2016-12-27 ENCOUNTER — Encounter: Payer: Self-pay | Admitting: Family Medicine

## 2016-12-27 VITALS — BP 110/64 | HR 97 | Temp 98.4°F | Resp 16 | Ht 62.0 in | Wt 165.3 lb

## 2016-12-27 DIAGNOSIS — F418 Other specified anxiety disorders: Secondary | ICD-10-CM | POA: Diagnosis not present

## 2016-12-27 DIAGNOSIS — M545 Low back pain, unspecified: Secondary | ICD-10-CM

## 2016-12-27 DIAGNOSIS — G8929 Other chronic pain: Secondary | ICD-10-CM | POA: Diagnosis not present

## 2016-12-27 DIAGNOSIS — F329 Major depressive disorder, single episode, unspecified: Secondary | ICD-10-CM

## 2016-12-27 DIAGNOSIS — F419 Anxiety disorder, unspecified: Secondary | ICD-10-CM

## 2016-12-27 MED ORDER — CLONAZEPAM 1 MG PO TABS: 1.0000 mg | ORAL_TABLET | Freq: Three times a day (TID) | ORAL | 0 refills | Status: DC | PRN

## 2016-12-27 MED ORDER — HYDROCODONE-ACETAMINOPHEN 10-325 MG PO TABS: 1.0000 | ORAL_TABLET | Freq: Three times a day (TID) | ORAL | 0 refills | Status: DC | PRN

## 2016-12-27 NOTE — Progress Notes (Signed)
Name: Kim Buchanan   MRN: [id number]    DOB: 1951-09-15   Date:12/27/2016       Progress Note  Subjective  Chief Complaint  Chief Complaint  Patient presents with  . Medication Refill    Back Pain  This is a chronic problem. The current episode started more than 1 year ago. The problem is unchanged. The pain is present in the lumbar spine. The pain does not radiate. The pain is at a severity of 2/10. The pain is moderate. The symptoms are aggravated by position (worse with vacuuming her floors). Pertinent negatives include no leg pain, numbness, paresthesias or tingling. She has tried analgesics (Opioid therapy, was on Hydrocodone, temporarily discontinued while in the hospital for Hysterectomy) for the symptoms.  Anxiety  Presents for follow-up visit. Symptoms include excessive worry, irritability and nervous/anxious behavior. Patient reports no depressed mood, panic or shortness of breath. The severity of symptoms is moderate and causing significant distress.      Past Medical History:  Diagnosis Date  . Anxiety   . Arthritis    lower back  . Depression   . GERD (gastroesophageal reflux disease)   . Hyperlipidemia   . Leaky heart valve    sees Dr Humphrey Rolls  . Panic attack   . Rectal fistula   . Sleep apnea   . Wears dentures    full upper    Past Surgical History:  Procedure Laterality Date  . COLONOSCOPY  2014  . COLONOSCOPY WITH PROPOFOL N/A 10/01/2016   Procedure: COLONOSCOPY WITH PROPOFOL;  Surgeon: Lucilla Lame, MD;  Location: [place];  Service: Endoscopy;  Laterality: N/A;  . CYSTOCELE REPAIR N/A 12/17/2016   Procedure: ANTERIOR REPAIR (CYSTOCELE);  Surgeon: Brayton Mars, MD;  Location: [facility];  Service: Gynecology;  Laterality: N/A;  . POLYPECTOMY  10/01/2016   Procedure: POLYPECTOMY;  Surgeon: Lucilla Lame, MD;  Location: [place];  Service: Endoscopy;;  . RECTAL SURGERY  06/28/2015   Rectal prolapse, laparoscopic rectopexy the  coldCharmont, MD; [facility]  . TUBAL LIGATION    . VAGINAL HYSTERECTOMY Bilateral 12/17/2016   Procedure: HYSTERECTOMY VAGINAL WITH BILATERAL SALPINGO OOPHERECTOMY;  Surgeon: Brayton Mars, MD;  Location: [facility];  Service: Gynecology;  Laterality: Bilateral;    Family History  Problem Relation Age of Onset  . Diabetes Mother   . Diabetes Sister   . Diabetes Brother   . Lymphoma Brother   . Breast cancer Neg Hx   . Ovarian cancer Neg Hx   . Colon cancer Neg Hx     Social History   Social History  . Marital status: Married    Spouse name: N/A  . Number of children: N/A  . Years of education: N/A   Occupational History  . Not on file.   Social History Main Topics  . Smoking status: Current Every Day Smoker    Packs/day: 0.50    Years: 45.00    Types: Cigarettes  . Smokeless tobacco: Never Used  . Alcohol use No  . Drug use: No  . Sexual activity: Not Currently   Other Topics Concern  . Not on file   Social History Narrative  . No narrative on file     Current Outpatient Prescriptions:  .  aspirin 81 MG tablet, Take 81 mg by mouth daily. , Disp: , Rfl:  .  clonazePAM (KLONOPIN) 1 MG tablet, Take 1 tablet (1 mg total) by mouth 3 (three) times daily as needed for anxiety., Disp:  90 tablet, Rfl: 0 .  docusate sodium (COLACE) 100 MG capsule, Take 1 capsule (100 mg total) by mouth 2 (two) times daily., Disp: 10 capsule, Rfl: 0 .  ibuprofen (ADVIL,MOTRIN) 800 MG tablet, Take 1 tablet (800 mg total) by mouth 3 (three) times daily., Disp: 50 tablet, Rfl: 1 .  omeprazole (PRILOSEC) 40 MG capsule, Take 1 capsule (40 mg total) by mouth daily., Disp: 90 capsule, Rfl: 1 .  polyethylene glycol (MIRALAX / GLYCOLAX) packet, Take 17 g by mouth daily., Disp: , Rfl:  .  senna (SENOKOT) 8.6 MG TABS tablet, Take 2 tablets by mouth daily as needed for mild constipation., Disp: , Rfl:  .  venlafaxine XR (EFFEXOR-XR) 150 MG 24 hr capsule, Take 1 capsule (150 mg total) by mouth at bedtime.  (Patient taking differently: Take 150 mg by mouth at bedtime. Takes a 150 mg and 75 mg to equal 225 mg nightly), Disp: 90 capsule, Rfl: 1 .  venlafaxine XR (EFFEXOR-XR) 75 MG 24 hr capsule, Take 1 capsule (75 mg total) by mouth at bedtime. (Patient taking differently: Take 75 mg by mouth at bedtime. Takes a 75 mg and 150 mg to equal 225 mg nightly), Disp: 90 capsule, Rfl: 1  No Known Allergies   Review of Systems  Constitutional: Positive for irritability.  Respiratory: Negative for shortness of breath.   Musculoskeletal: Positive for back pain.  Neurological: Negative for tingling, numbness and paresthesias.  Psychiatric/Behavioral: The patient is nervous/anxious.     Objective  Vitals:   12/27/16 0849  BP: 110/64  Pulse: 97  Resp: 16  Temp: 98.4 F (36.9 C)  TempSrc: Oral  SpO2: 98%  Weight: 165 lb 4.8 oz (75 kg)  Height: 5\' 2"  (1.575 m)    Physical Exam  Constitutional: She is oriented to person, place, and time and well-developed, well-nourished, and in no distress.  HENT:  Head: Normocephalic and atraumatic.  Cardiovascular: Normal rate, regular rhythm and normal heart sounds.   No murmur heard. Pulmonary/Chest: Effort normal and breath sounds normal. She has no wheezes.  Abdominal: Soft. Bowel sounds are normal. There is no tenderness.  Musculoskeletal:       Lumbar back: She exhibits tenderness, pain and spasm.       Back:  Neurological: She is alert and oriented to person, place, and time.  Psychiatric: Mood, memory, affect and judgment normal.  Nursing note and vitals reviewed.     Assessment & Plan  1. Anxiety and depression Stable, responsive to clonazepam taken up to 3 times daily when needed - clonazePAM (KLONOPIN) 1 MG tablet; Take 1 tablet (1 mg total) by mouth 3 (three) times daily as needed for anxiety.  Dispense: 90 tablet; Refill: 0  2. Chronic right-sided low back pain without sciatica Patient has finished taking oxycodone prescribed upon  discharge from the hospital, restarted back on hydrocodone 10 mg every 8 hours when needed, compliant with controlled substances agreement. Refills provided - HYDROcodone-acetaminophen (NORCO) 10-325 MG tablet; Take 1 tablet by mouth every 8 (eight) hours as needed.  Dispense: 90 tablet; Refill: 0   Jimy Gates Asad A. [facility] 12/27/2016 9:07 AM

## 2017-01-25 ENCOUNTER — Encounter: Payer: Self-pay | Admitting: Family Medicine

## 2017-01-25 ENCOUNTER — Ambulatory Visit (INDEPENDENT_AMBULATORY_CARE_PROVIDER_SITE_OTHER): Payer: Medicare Other | Admitting: Family Medicine

## 2017-01-25 DIAGNOSIS — F418 Other specified anxiety disorders: Secondary | ICD-10-CM | POA: Diagnosis not present

## 2017-01-25 DIAGNOSIS — G8929 Other chronic pain: Secondary | ICD-10-CM

## 2017-01-25 DIAGNOSIS — M545 Low back pain: Secondary | ICD-10-CM

## 2017-01-25 DIAGNOSIS — F419 Anxiety disorder, unspecified: Secondary | ICD-10-CM

## 2017-01-25 DIAGNOSIS — F329 Major depressive disorder, single episode, unspecified: Secondary | ICD-10-CM

## 2017-01-25 MED ORDER — HYDROCODONE-ACETAMINOPHEN 10-325 MG PO TABS: 1.0000 | ORAL_TABLET | Freq: Three times a day (TID) | ORAL | 0 refills | Status: DC | PRN

## 2017-01-25 MED ORDER — CLONAZEPAM 1 MG PO TABS: 1.0000 mg | ORAL_TABLET | Freq: Three times a day (TID) | ORAL | 0 refills | Status: DC | PRN

## 2017-01-25 NOTE — Progress Notes (Signed)
Name: Kim Buchanan   MRN: [id number]    DOB: [birth year]   Date:01/25/2017       Progress Note  Subjective  Chief Complaint  Chief Complaint  Patient presents with  . Medication Refill    Back Pain  This is a chronic problem. The current episode started more than 1 year ago. The problem is unchanged. The pain is present in the lumbar spine. The pain does not radiate. The pain is at a severity of 2/10. The pain is moderate. The symptoms are aggravated by position and bending (worse with vacuuming her floors and doing other household work.). Pertinent negatives include no leg pain, numbness, paresthesias or tingling. She has tried analgesics for the symptoms.  Anxiety  Presents for follow-up visit. Symptoms include excessive worry, irritability and nervous/anxious behavior. Patient reports no depressed mood, panic or shortness of breath. The severity of symptoms is moderate and causing significant distress.       Past Medical History:  Diagnosis Date  . Anxiety   . Arthritis    lower back  . Depression   . GERD (gastroesophageal reflux disease)   . Hyperlipidemia   . Leaky heart valve    sees Dr Humphrey Rolls  . Panic attack   . Rectal fistula   . Sleep apnea   . Wears dentures    full upper    Past Surgical History:  Procedure Laterality Date  . COLONOSCOPY  2014  . COLONOSCOPY WITH PROPOFOL N/A 10/01/2016   Procedure: COLONOSCOPY WITH PROPOFOL;  Surgeon: Lucilla Lame, MD;  Location: [place];  Service: Endoscopy;  Laterality: N/A;  . CYSTOCELE REPAIR N/A 12/17/2016   Procedure: ANTERIOR REPAIR (CYSTOCELE);  Surgeon: Brayton Mars, MD;  Location: [facility];  Service: Gynecology;  Laterality: N/A;  . POLYPECTOMY  10/01/2016   Procedure: POLYPECTOMY;  Surgeon: Lucilla Lame, MD;  Location: [place];  Service: Endoscopy;;  . RECTAL SURGERY  06/28/2015   Rectal prolapse, laparoscopic rectopexy the coldCharmont, MD; [facility]  . TUBAL LIGATION    . VAGINAL  HYSTERECTOMY Bilateral 12/17/2016   Procedure: HYSTERECTOMY VAGINAL WITH BILATERAL SALPINGO OOPHERECTOMY;  Surgeon: Brayton Mars, MD;  Location: [facility];  Service: Gynecology;  Laterality: Bilateral;    Family History  Problem Relation Age of Onset  . Diabetes Mother   . Diabetes Sister   . Diabetes Brother   . Lymphoma Brother   . Breast cancer Neg Hx   . Ovarian cancer Neg Hx   . Colon cancer Neg Hx     Social History   Social History  . Marital status: Married    Spouse name: N/A  . Number of children: N/A  . Years of education: N/A   Occupational History  . Not on file.   Social History Main Topics  . Smoking status: Current Every Day Smoker    Packs/day: 0.50    Years: 45.00    Types: Cigarettes  . Smokeless tobacco: Never Used  . Alcohol use No  . Drug use: No  . Sexual activity: Not Currently   Other Topics Concern  . Not on file   Social History Narrative  . No narrative on file     Current Outpatient Prescriptions:  .  aspirin 81 MG tablet, Take 81 mg by mouth daily. , Disp: , Rfl:  .  clonazePAM (KLONOPIN) 1 MG tablet, Take 1 tablet (1 mg total) by mouth 3 (three) times daily as needed for anxiety., Disp: 90 tablet, Rfl: 0 .  docusate sodium (COLACE) 100 MG capsule, Take 1 capsule (100 mg total) by mouth 2 (two) times daily., Disp: 10 capsule, Rfl: 0 .  HYDROcodone-acetaminophen (NORCO) 10-325 MG tablet, Take 1 tablet by mouth every 8 (eight) hours as needed., Disp: 90 tablet, Rfl: 0 .  ibuprofen (ADVIL,MOTRIN) 800 MG tablet, Take 1 tablet (800 mg total) by mouth 3 (three) times daily., Disp: 50 tablet, Rfl: 1 .  omeprazole (PRILOSEC) 40 MG capsule, Take 1 capsule (40 mg total) by mouth daily., Disp: 90 capsule, Rfl: 1 .  polyethylene glycol (MIRALAX / GLYCOLAX) packet, Take 17 g by mouth daily., Disp: , Rfl:  .  senna (SENOKOT) 8.6 MG TABS tablet, Take 2 tablets by mouth daily as needed for mild constipation., Disp: , Rfl:  .  venlafaxine XR  (EFFEXOR-XR) 150 MG 24 hr capsule, Take 1 capsule (150 mg total) by mouth at bedtime. (Patient taking differently: Take 150 mg by mouth at bedtime. Takes a 150 mg and 75 mg to equal 225 mg nightly), Disp: 90 capsule, Rfl: 1 .  venlafaxine XR (EFFEXOR-XR) 75 MG 24 hr capsule, Take 1 capsule (75 mg total) by mouth at bedtime. (Patient taking differently: Take 75 mg by mouth at bedtime. Takes a 75 mg and 150 mg to equal 225 mg nightly), Disp: 90 capsule, Rfl: 1  No Known Allergies   Review of Systems  Constitutional: Positive for irritability.  Respiratory: Negative for shortness of breath.   Musculoskeletal: Positive for back pain.  Neurological: Negative for tingling, numbness and paresthesias.  Psychiatric/Behavioral: The patient is nervous/anxious.       Objective  Vitals:   01/25/17 0956  BP: 106/74  Pulse: 96  Resp: 16  Temp: 98.8 F (37.1 C)  TempSrc: Oral  SpO2: 97%  Weight: 171 lb (77.6 kg)  Height: 5\' 2"  (1.575 m)    Physical Exam  Constitutional: She is oriented to person, place, and time and well-developed, well-nourished, and in no distress.  HENT:  Head: Normocephalic and atraumatic.  Cardiovascular: Normal rate, regular rhythm and normal heart sounds.   No murmur heard. Pulmonary/Chest: Effort normal and breath sounds normal. She has no wheezes.  Abdominal: Soft. Bowel sounds are normal. There is no tenderness.  Musculoskeletal:       Lumbar back: She exhibits tenderness, pain and spasm.       Back:  Neurological: She is alert and oriented to person, place, and time.  Psychiatric: Mood, memory, affect and judgment normal.  Nursing note and vitals reviewed.    Assessment & Plan  1. Anxiety and depression Stable and responsive to clonazepam taken up to 3 times daily when needed, patient compliant with controlled substances agreement and understands the dependence potential, side effects and drug interactions of benzodiazepines. Refills provided and  follow-up in one month - clonazePAM (KLONOPIN) 1 MG tablet; Take 1 tablet (1 mg total) by mouth 3 (three) times daily as needed for anxiety.  Dispense: 90 tablet; Refill: 0  2. Chronic right-sided low back pain without sciatica Stable, responsive to hydrocortisone 10 mg taken every 8 hours as needed, refills provided, follow-up in one month - HYDROcodone-acetaminophen (NORCO) 10-325 MG tablet; Take 1 tablet by mouth every 8 (eight) hours as needed.  Dispense: 90 tablet; Refill: 0   Syed [facility] 01/25/2017 10:02 AM

## 2017-02-22 ENCOUNTER — Ambulatory Visit (INDEPENDENT_AMBULATORY_CARE_PROVIDER_SITE_OTHER): Payer: Medicare Other | Admitting: Family Medicine

## 2017-02-22 ENCOUNTER — Encounter: Payer: Self-pay | Admitting: Family Medicine

## 2017-02-22 VITALS — BP 110/70 | HR 92 | Temp 98.0°F | Resp 16 | Ht 62.0 in | Wt 167.2 lb

## 2017-02-22 DIAGNOSIS — M545 Low back pain: Secondary | ICD-10-CM | POA: Diagnosis not present

## 2017-02-22 DIAGNOSIS — G8929 Other chronic pain: Secondary | ICD-10-CM

## 2017-02-22 DIAGNOSIS — F411 Generalized anxiety disorder: Secondary | ICD-10-CM | POA: Diagnosis not present

## 2017-02-22 DIAGNOSIS — F3341 Major depressive disorder, recurrent, in partial remission: Secondary | ICD-10-CM

## 2017-02-22 MED ORDER — VENLAFAXINE HCL ER 75 MG PO CP24: 75.0000 mg | ORAL_CAPSULE | Freq: Every day | ORAL | 1 refills | Status: DC

## 2017-02-22 MED ORDER — HYDROCODONE-ACETAMINOPHEN 10-325 MG PO TABS: 1.0000 | ORAL_TABLET | Freq: Three times a day (TID) | ORAL | 0 refills | Status: DC | PRN

## 2017-02-22 MED ORDER — VENLAFAXINE HCL ER 150 MG PO CP24: 150.0000 mg | ORAL_CAPSULE | Freq: Every day | ORAL | 1 refills | Status: DC

## 2017-02-22 MED ORDER — CLONAZEPAM 1 MG PO TABS: 1.0000 mg | ORAL_TABLET | Freq: Three times a day (TID) | ORAL | 0 refills | Status: DC | PRN

## 2017-02-22 NOTE — Progress Notes (Signed)
Name: Kim Buchanan   MRN: [id number]    DOB: 1950-11-19   Date:02/22/2017       Progress Note  Subjective  Chief Complaint  Chief Complaint  Patient presents with  . Follow-up    1 mo  . Medication Refill    Back Pain  This is a chronic problem. The problem is unchanged. The pain is present in the lumbar spine. The pain does not radiate. The pain is at a severity of 2/10. The pain is moderate. The symptoms are aggravated by position and bending (worse with vacuuming her floors and doing other household work., standing too long). Pertinent negatives include no leg pain, numbness, paresthesias or tingling. She has tried analgesics for the symptoms.  Anxiety  Presents for follow-up visit. Symptoms include excessive worry, irritability, nervous/anxious behavior and panic. Patient reports no depressed mood, shortness of breath or suicidal ideas. The severity of symptoms is moderate and causing significant distress.    Depression         This is a chronic problem.  The onset quality is gradual. The problem is unchanged.  Associated symptoms include no fatigue, no helplessness, no hopelessness, no decreased interest, not sad and no suicidal ideas.  Past treatments include SNRIs - Serotonin and norepinephrine reuptake inhibitors.  Compliance with treatment is good.  Past medical history includes anxiety.      Past Medical History:  Diagnosis Date  . Anxiety   . Arthritis    lower back  . Depression   . GERD (gastroesophageal reflux disease)   . Hyperlipidemia   . Leaky heart valve    sees Dr Humphrey Rolls  . Panic attack   . Rectal fistula   . Sleep apnea   . Wears dentures    full upper    Past Surgical History:  Procedure Laterality Date  . COLONOSCOPY  2014  . COLONOSCOPY WITH PROPOFOL N/A 10/01/2016   Procedure: COLONOSCOPY WITH PROPOFOL;  Surgeon: Lucilla Lame, MD;  Location: [place];  Service: Endoscopy;  Laterality: N/A;  . CYSTOCELE REPAIR N/A 12/17/2016   Procedure:  ANTERIOR REPAIR (CYSTOCELE);  Surgeon: Brayton Mars, MD;  Location: [facility];  Service: Gynecology;  Laterality: N/A;  . POLYPECTOMY  10/01/2016   Procedure: POLYPECTOMY;  Surgeon: Lucilla Lame, MD;  Location: [place];  Service: Endoscopy;;  . RECTAL SURGERY  06/28/2015   Rectal prolapse, laparoscopic rectopexy the coldCharmont, MD; [facility]  . TUBAL LIGATION    . VAGINAL HYSTERECTOMY Bilateral 12/17/2016   Procedure: HYSTERECTOMY VAGINAL WITH BILATERAL SALPINGO OOPHERECTOMY;  Surgeon: Brayton Mars, MD;  Location: [facility];  Service: Gynecology;  Laterality: Bilateral;    Family History  Problem Relation Age of Onset  . Diabetes Mother   . Diabetes Sister   . Diabetes Brother   . Lymphoma Brother   . Breast cancer Neg Hx   . Ovarian cancer Neg Hx   . Colon cancer Neg Hx     Social History   Social History  . Marital status: Married    Spouse name: N/A  . Number of children: N/A  . Years of education: N/A   Occupational History  . Not on file.   Social History Main Topics  . Smoking status: Current Every Day Smoker    Packs/day: 0.50    Years: 45.00    Types: Cigarettes  . Smokeless tobacco: Never Used  . Alcohol use No  . Drug use: No  . Sexual activity: Not Currently   Other  Topics Concern  . Not on file   Social History Narrative  . No narrative on file     Current Outpatient Prescriptions:  .  aspirin 81 MG tablet, Take 81 mg by mouth daily. , Disp: , Rfl:  .  clonazePAM (KLONOPIN) 1 MG tablet, Take 1 tablet (1 mg total) by mouth 3 (three) times daily as needed for anxiety., Disp: 90 tablet, Rfl: 0 .  docusate sodium (COLACE) 100 MG capsule, Take 1 capsule (100 mg total) by mouth 2 (two) times daily., Disp: 10 capsule, Rfl: 0 .  HYDROcodone-acetaminophen (NORCO) 10-325 MG tablet, Take 1 tablet by mouth every 8 (eight) hours as needed., Disp: 90 tablet, Rfl: 0 .  ibuprofen (ADVIL,MOTRIN) 800 MG tablet, Take 1 tablet (800 mg total) by mouth 3  (three) times daily., Disp: 50 tablet, Rfl: 1 .  omeprazole (PRILOSEC) 40 MG capsule, Take 1 capsule (40 mg total) by mouth daily., Disp: 90 capsule, Rfl: 1 .  polyethylene glycol (MIRALAX / GLYCOLAX) packet, Take 17 g by mouth daily., Disp: , Rfl:  .  senna (SENOKOT) 8.6 MG TABS tablet, Take 2 tablets by mouth daily as needed for mild constipation., Disp: , Rfl:  .  venlafaxine XR (EFFEXOR-XR) 150 MG 24 hr capsule, Take 1 capsule (150 mg total) by mouth at bedtime. (Patient taking differently: Take 150 mg by mouth at bedtime. Takes a 150 mg and 75 mg to equal 225 mg nightly), Disp: 90 capsule, Rfl: 1 .  venlafaxine XR (EFFEXOR-XR) 75 MG 24 hr capsule, Take 1 capsule (75 mg total) by mouth at bedtime. (Patient taking differently: Take 75 mg by mouth at bedtime. Takes a 75 mg and 150 mg to equal 225 mg nightly), Disp: 90 capsule, Rfl: 1  No Known Allergies   Review of Systems  Constitutional: Positive for irritability. Negative for fatigue.  Respiratory: Negative for shortness of breath.   Musculoskeletal: Positive for back pain.  Neurological: Negative for tingling, numbness and paresthesias.  Psychiatric/Behavioral: Positive for depression. Negative for suicidal ideas. The patient is nervous/anxious.       Objective  Vitals:   02/22/17 0942  BP: 110/70  Pulse: 92  Resp: 16  Temp: 98 F (36.7 C)  TempSrc: Oral  SpO2: 96%  Weight: 167 lb 3.2 oz (75.8 kg)  Height: 5\' 2"  (1.575 m)    Physical Exam  Constitutional: She is oriented to person, place, and time and well-developed, well-nourished, and in no distress.  HENT:  Head: Normocephalic and atraumatic.  Cardiovascular: Normal rate, regular rhythm and normal heart sounds.   No murmur heard. Pulmonary/Chest: Effort normal and breath sounds normal. She has no wheezes.  Abdominal: Soft. Bowel sounds are normal. There is no tenderness.  Musculoskeletal:       Lumbar back: She exhibits tenderness, pain and spasm.        Back:  Neurological: She is alert and oriented to person, place, and time.  Psychiatric: Mood, memory, affect and judgment normal.  Nursing note and vitals reviewed.      Assessment & Plan  1. Generalized anxiety disorder Concurrent symptoms of panic disorder, adequately controlled on clonazepam taken up to 3 times daily when needed, patient compliant with controlled substances agreement and understands the dependence potential, side effects and drug interactions of benzodiazepines. Refills provided and follow-up in one month - clonazePAM (KLONOPIN) 1 MG tablet; Take 1 tablet (1 mg total) by mouth 3 (three) times daily as needed for anxiety.  Dispense: 90 tablet; Refill: 0  2. Chronic right-sided low back pain without sciatica Stable and responsive to hydrocodone taken every 8 hours as needed, refills provided - HYDROcodone-acetaminophen (NORCO) 10-325 MG tablet; Take 1 tablet by mouth every 8 (eight) hours as needed.  Dispense: 90 tablet; Refill: 0  3. Recurrent major depressive disorder, in partial remission (HCC)  - venlafaxine XR (EFFEXOR-XR) 150 MG 24 hr capsule; Take 1 capsule (150 mg total) by mouth at bedtime.  Dispense: 90 capsule; Refill: 1 - venlafaxine XR (EFFEXOR-XR) 75 MG 24 hr capsule; Take 1 capsule (75 mg total) by mouth at bedtime.  Dispense: 90 capsule; Refill: 1   Kip Cropp [facility] 02/22/2017 9:56 AM

## 2017-03-07 ENCOUNTER — Ambulatory Visit (INDEPENDENT_AMBULATORY_CARE_PROVIDER_SITE_OTHER): Payer: Medicare Other | Admitting: Obstetrics and Gynecology

## 2017-03-07 ENCOUNTER — Encounter: Payer: Self-pay | Admitting: Obstetrics and Gynecology

## 2017-03-07 ENCOUNTER — Encounter: Payer: Medicare Other | Admitting: Obstetrics and Gynecology

## 2017-03-07 VITALS — BP 110/63 | HR 97 | Ht 62.0 in | Wt 175.0 lb

## 2017-03-07 DIAGNOSIS — Z90710 Acquired absence of both cervix and uterus: Secondary | ICD-10-CM

## 2017-03-07 DIAGNOSIS — R339 Retention of urine, unspecified: Secondary | ICD-10-CM

## 2017-03-07 DIAGNOSIS — R300 Dysuria: Secondary | ICD-10-CM

## 2017-03-07 LAB — POCT URINALYSIS DIPSTICK
Bilirubin, UA: NEGATIVE
Blood, UA: NEGATIVE
Glucose, UA: NEGATIVE
Ketones, UA: NEGATIVE
Leukocytes, UA: NEGATIVE
Nitrite, UA: NEGATIVE
Protein, UA: NEGATIVE
Spec Grav, UA: 1.01 (ref 1.010–1.025)
Urobilinogen, UA: NEGATIVE E.U./dL — AB
pH, UA: 6 (ref 5.0–8.0)

## 2017-03-07 MED ORDER — NITROFURANTOIN MONOHYD MACRO 100 MG PO CAPS: 100.0000 mg | ORAL_CAPSULE | Freq: Two times a day (BID) | ORAL | 1 refills | Status: DC

## 2017-03-07 NOTE — Patient Instructions (Signed)
1. Bladder is drained with a urinary catheter today 2. Take Macrobid 1 pill twice a day for 7 days 3. Resume all activities without restriction 4. Return if continuing to have difficulty voiding 5. Increase hydration with water

## 2017-03-07 NOTE — Progress Notes (Signed)
Chief complaint: 1. Urinary retention 2. Painful pubic bone  Patient presents for evaluation of difficulty voiding over the past 3 days along with a tender pubic symphysis. She has had some dysuria with voiding. Status post TVH BSO with anterior colporrhaphy in February 2018; patient never returned for her postop check She does report having normal bowel function with mild constipation on occasion. She had been urinating normally until 3 days ago when she began having difficulty emptying her bladder. She does not report fever chills or flank pain.  Past Surgical History:  Procedure Laterality Date  . COLONOSCOPY  2014  . COLONOSCOPY WITH PROPOFOL N/A 10/01/2016   Procedure: COLONOSCOPY WITH PROPOFOL;  Surgeon: Lucilla Lame, MD;  Location: [place];  Service: Endoscopy;  Laterality: N/A;  . CYSTOCELE REPAIR N/A 12/17/2016   Procedure: ANTERIOR REPAIR (CYSTOCELE);  Surgeon: Brayton Mars, MD;  Location: [facility];  Service: Gynecology;  Laterality: N/A;  . POLYPECTOMY  10/01/2016   Procedure: POLYPECTOMY;  Surgeon: Lucilla Lame, MD;  Location: [place];  Service: Endoscopy;;  . RECTAL SURGERY  06/28/2015   Rectal prolapse, laparoscopic rectopexy the coldCharmont, MD; [facility]  . TUBAL LIGATION    . VAGINAL HYSTERECTOMY Bilateral 12/17/2016   Procedure: HYSTERECTOMY VAGINAL WITH BILATERAL SALPINGO OOPHERECTOMY;  Surgeon: Brayton Mars, MD;  Location: [facility];  Service: Gynecology;  Laterality: Bilateral;   OBJECTIVE: BP 110/63   Pulse 97   Ht 5\' 2"  (1.575 m)   Wt 175 lb (79.4 kg)   BMI 32.01 kg/m  Pleasant female in no acute distress. Alert and oriented. Abdomen: Soft, nontender without organomegaly Pelvic exam: External genitalia-normal BUS-normal Vagina-good anterior vault support; mild anterior vaginal wall tenderness; moderate rectocele; good vaginal apex support without enterocele Bimanual-palpable masses or tenderness Cervix-surgically  absent Uterus-surgically absent Rectovaginal-normal external exam  PROCEDURE: Postvoid residual check Verbal consent is obtained. Patient is placed in the dorsal lithotomy position. The urethra is cleansed with Betadine. Using sterile technique, a red Robinson catheter is inserted with release of 150 mL of clear yellow urine (specimen sent for culture). Procedure was well-tolerated.  ASSESSMENT: 1. Status post TVH BSO with anterior colporrhaphy in February 2018; no follow-up for postop check 2. New onset urinary retention and dysuria; cannot rule out UTI  PLAN: 1. Post void residual urine specimen is obtained for urine culture 2. Begin Macrobid twice a day for 7 days 3. Resume all normal activities without restriction 4. Return if voiding difficulties persist 5. Increase water intake.  A total of 15 minutes were spent face-to-face with the patient during this encounter and over half of that time dealt with counseling and coordination of care.  Brayton Mars, MD  Note: This dictation was prepared with Dragon dictation along with smaller phrase technology. Any transcriptional errors that result from this process are unintentional.

## 2017-03-09 LAB — URINE CULTURE: Organism ID, Bacteria: NO GROWTH

## 2017-03-26 ENCOUNTER — Encounter: Payer: Self-pay | Admitting: Family Medicine

## 2017-03-26 ENCOUNTER — Telehealth: Payer: Self-pay

## 2017-03-26 ENCOUNTER — Ambulatory Visit (INDEPENDENT_AMBULATORY_CARE_PROVIDER_SITE_OTHER): Payer: Medicare Other | Admitting: Family Medicine

## 2017-03-26 VITALS — BP 113/68 | HR 96 | Temp 98.8°F | Resp 16 | Ht 62.0 in | Wt 165.3 lb

## 2017-03-26 DIAGNOSIS — M545 Low back pain: Secondary | ICD-10-CM

## 2017-03-26 DIAGNOSIS — F411 Generalized anxiety disorder: Secondary | ICD-10-CM

## 2017-03-26 DIAGNOSIS — G8929 Other chronic pain: Secondary | ICD-10-CM | POA: Diagnosis not present

## 2017-03-26 DIAGNOSIS — Z720 Tobacco use: Secondary | ICD-10-CM

## 2017-03-26 MED ORDER — HYDROCODONE-ACETAMINOPHEN 10-325 MG PO TABS: 1.0000 | ORAL_TABLET | Freq: Three times a day (TID) | ORAL | 0 refills | Status: DC | PRN

## 2017-03-26 MED ORDER — CLONAZEPAM 1 MG PO TABS: 1.0000 mg | ORAL_TABLET | Freq: Three times a day (TID) | ORAL | 0 refills | Status: DC | PRN

## 2017-03-26 NOTE — Telephone Encounter (Signed)
Dr. Manuella Ghazi patient is requesting a call back concerning a health issue she would like  To discuss with only you, please call patient

## 2017-03-26 NOTE — Progress Notes (Signed)
Name: Kim Buchanan   MRN: [id number]    DOB: 1951-06-12   Date:03/26/2017       Progress Note  Subjective  Chief Complaint  Chief Complaint  Patient presents with  . Follow-up    1 mo  . Medication Refill    Back Pain  This is a chronic problem. The current episode started more than 1 year ago. The problem is unchanged. The pain is present in the lumbar spine. The pain does not radiate. The pain is at a severity of 2/10. The pain is moderate. The symptoms are aggravated by position and bending (worse with vacuuming her floors and doing other household work.). Pertinent negatives include no leg pain, numbness, paresthesias or tingling. She has tried analgesics for the symptoms.  Anxiety  Presents for follow-up visit. Symptoms include depressed mood, excessive worry, irritability, nervous/anxious behavior and panic. Patient reports no shortness of breath. The severity of symptoms is moderate and causing significant distress.    Nicotine Dependence  Presents for initial visit. Symptoms include cravings and irritability. Preferred tobacco types include cigarettes. Preferred cigarette types include filtered. Preferred strength is regular and light. Preferred cigarettes are menthol (Misty 120 Menthol). Her urge triggers include company of smokers, drinking coffee, driving, meal time and stress. Her first smoke is before 6 AM. She smokes < 1/2 a pack (10 cigarettes/day) of cigarettes per day. She started smoking when she was >60 years old. Past treatments include varenicline ('cold [place]'). Kim Buchanan is not interested in quitting. Kim Buchanan has tried to quit 2 times.     Past Medical History:  Diagnosis Date  . Anxiety   . Arthritis    lower back  . Depression   . GERD (gastroesophageal reflux disease)   . Hyperlipidemia   . Leaky heart valve    sees Dr Humphrey Rolls  . Panic attack   . Rectal fistula   . Sleep apnea   . Wears dentures    full upper    Past Surgical History:  Procedure  Laterality Date  . COLONOSCOPY  2014  . COLONOSCOPY WITH PROPOFOL N/A 10/01/2016   Procedure: COLONOSCOPY WITH PROPOFOL;  Surgeon: Lucilla Lame, MD;  Location: [place];  Service: Endoscopy;  Laterality: N/A;  . CYSTOCELE REPAIR N/A 12/17/2016   Procedure: ANTERIOR REPAIR (CYSTOCELE);  Surgeon: Brayton Mars, MD;  Location: [facility];  Service: Gynecology;  Laterality: N/A;  . POLYPECTOMY  10/01/2016   Procedure: POLYPECTOMY;  Surgeon: Lucilla Lame, MD;  Location: [place];  Service: Endoscopy;;  . RECTAL SURGERY  06/28/2015   Rectal prolapse, laparoscopic rectopexy the coldCharmont, MD; [facility]  . TUBAL LIGATION    . VAGINAL HYSTERECTOMY Bilateral 12/17/2016   Procedure: HYSTERECTOMY VAGINAL WITH BILATERAL SALPINGO OOPHERECTOMY;  Surgeon: Brayton Mars, MD;  Location: [facility];  Service: Gynecology;  Laterality: Bilateral;    Family History  Problem Relation Age of Onset  . Diabetes Mother   . Diabetes Sister   . Diabetes Brother   . Lymphoma Brother   . Breast cancer Neg Hx   . Ovarian cancer Neg Hx   . Colon cancer Neg Hx     Social History   Social History  . Marital status: Married    Spouse name: N/A  . Number of children: N/A  . Years of education: N/A   Occupational History  . Not on file.   Social History Main Topics  . Smoking status: Current Every Day Smoker    Packs/day: 0.50  Years: 45.00    Types: Cigarettes  . Smokeless tobacco: Never Used  . Alcohol use No  . Drug use: No  . Sexual activity: Not Currently   Other Topics Concern  . Not on file   Social History Narrative  . No narrative on file     Current Outpatient Prescriptions:  .  aspirin 81 MG tablet, Take 81 mg by mouth daily. , Disp: , Rfl:  .  clonazePAM (KLONOPIN) 1 MG tablet, Take 1 tablet (1 mg total) by mouth 3 (three) times daily as needed for anxiety., Disp: 90 tablet, Rfl: 0 .  docusate sodium (COLACE) 100 MG capsule, Take 1 capsule (100 mg total) by  mouth 2 (two) times daily., Disp: 10 capsule, Rfl: 0 .  HYDROcodone-acetaminophen (NORCO) 10-325 MG tablet, Take 1 tablet by mouth every 8 (eight) hours as needed., Disp: 90 tablet, Rfl: 0 .  ibuprofen (ADVIL,MOTRIN) 800 MG tablet, Take 1 tablet (800 mg total) by mouth 3 (three) times daily., Disp: 50 tablet, Rfl: 1 .  omeprazole (PRILOSEC) 40 MG capsule, Take 1 capsule (40 mg total) by mouth daily., Disp: 90 capsule, Rfl: 1 .  polyethylene glycol (MIRALAX / GLYCOLAX) packet, Take 17 g by mouth daily., Disp: , Rfl:  .  senna (SENOKOT) 8.6 MG TABS tablet, Take 2 tablets by mouth daily as needed for mild constipation., Disp: , Rfl:  .  venlafaxine XR (EFFEXOR-XR) 150 MG 24 hr capsule, Take 1 capsule (150 mg total) by mouth at bedtime., Disp: 90 capsule, Rfl: 1 .  venlafaxine XR (EFFEXOR-XR) 75 MG 24 hr capsule, Take 1 capsule (75 mg total) by mouth at bedtime., Disp: 90 capsule, Rfl: 1  No Known Allergies   Review of Systems  Constitutional: Positive for irritability.  Respiratory: Negative for shortness of breath.   Musculoskeletal: Positive for back pain.  Neurological: Negative for tingling, numbness and paresthesias.  Psychiatric/Behavioral: The patient is nervous/anxious.       Objective  Vitals:   03/26/17 0951  BP: 113/68  Pulse: 96  Resp: 16  Temp: 98.8 F (37.1 C)  TempSrc: Oral  SpO2: 96%  Weight: 165 lb 4.8 oz (75 kg)  Height: 5\' 2"  (1.575 m)    Physical Exam  Constitutional: She is oriented to person, place, and time and well-developed, well-nourished, and in no distress.  HENT:  Head: Normocephalic and atraumatic.  Cardiovascular: Normal rate, regular rhythm and normal heart sounds.   No murmur heard. Pulmonary/Chest: Effort normal and breath sounds normal. She has no wheezes.  Abdominal: Soft. Bowel sounds are normal. There is no tenderness.  Musculoskeletal:       Lumbar back: She exhibits tenderness, pain and spasm.       Back:  Neurological: She is  alert and oriented to person, place, and time.  Psychiatric: Mood, memory, affect and judgment normal.  Nursing note and vitals reviewed.   Assessment & Plan  1. Generalized anxiety disorder Stable and responsive to clonazepam taken up to 3 times daily when needed, refills provided - clonazePAM (KLONOPIN) 1 MG tablet; Take 1 tablet (1 mg total) by mouth 3 (three) times daily as needed for anxiety.  Dispense: 90 tablet; Refill: 0  2. Chronic right-sided low back pain without sciatica Stable and responsive to hydrocodone taken up to 3 times daily as needed, patient compliant with controlled substances agreement. Refills provided - HYDROcodone-acetaminophen (NORCO) 10-325 MG tablet; Take 1 tablet by mouth every 8 (eight) hours as needed.  Dispense: 90 tablet; Refill: 0  3. Tobacco abuse Patient is not ready to quit smoking at this time, we'll discuss when ready.   Aylana Hirschfeld [facility] 03/26/2017 10:36 AM

## 2017-03-26 NOTE — Telephone Encounter (Signed)
Patient's wife is concerned about her husband Kim Buchanan a Ve[place] was also my patient. She is worried that he is getting more short of breath, he 'gives out easily'and she has to help her out of the shower. She wants to have tests done on his heart. I have informed her that I will have my staff call their home to make an appointment for him to evaluate his symptoms. Patient verbalized agreement.

## 2017-03-27 NOTE — Telephone Encounter (Signed)
Pt is scheduled for tomorrow 

## 2017-03-27 NOTE — Telephone Encounter (Signed)
Can you please schedule patient an appointment with Dr. Manuella Ghazi. Thanks

## 2017-04-25 ENCOUNTER — Ambulatory Visit (INDEPENDENT_AMBULATORY_CARE_PROVIDER_SITE_OTHER): Payer: Medicare Other | Admitting: Family Medicine

## 2017-04-25 ENCOUNTER — Encounter: Payer: Self-pay | Admitting: Family Medicine

## 2017-04-25 DIAGNOSIS — M545 Low back pain, unspecified: Secondary | ICD-10-CM

## 2017-04-25 DIAGNOSIS — F411 Generalized anxiety disorder: Secondary | ICD-10-CM

## 2017-04-25 DIAGNOSIS — G8929 Other chronic pain: Secondary | ICD-10-CM | POA: Diagnosis not present

## 2017-04-25 MED ORDER — HYDROCODONE-ACETAMINOPHEN 10-325 MG PO TABS: 1.0000 | ORAL_TABLET | Freq: Three times a day (TID) | ORAL | 0 refills | Status: DC | PRN

## 2017-04-25 MED ORDER — CLONAZEPAM 1 MG PO TABS: 1.0000 mg | ORAL_TABLET | Freq: Three times a day (TID) | ORAL | 0 refills | Status: DC | PRN

## 2017-04-25 NOTE — Progress Notes (Signed)
Name: Kim Buchanan   MRN: [id number]    DOB: [birth year]   Date:04/25/2017       Progress Note  Subjective  Chief Complaint  Chief Complaint  Patient presents with  . Follow-up    1 month F/U  . Back Pain    Needs Refill  . Anxiety    Clonozapam    Back Pain  This is a chronic problem. The current episode started more than 1 year ago. The problem is unchanged. The pain is present in the lumbar spine. The pain does not radiate. The pain is at a severity of 2/10. The pain is moderate. The symptoms are aggravated by position and bending (worse with vacuuming her floors and doing other household work.). Pertinent negatives include no leg pain, numbness, paresthesias or tingling. She has tried analgesics for the symptoms.  Anxiety  Presents for follow-up visit. Symptoms include excessive worry, irritability and nervous/anxious behavior. Patient reports no depressed mood, panic or shortness of breath. The severity of symptoms is moderate and causing significant distress. The quality of sleep is fair.      Past Medical History:  Diagnosis Date  . Anxiety   . Arthritis    lower back  . Depression   . GERD (gastroesophageal reflux disease)   . Hyperlipidemia   . Leaky heart valve    sees Dr Humphrey Rolls  . Panic attack   . Rectal fistula   . Sleep apnea   . Wears dentures    full upper    Past Surgical History:  Procedure Laterality Date  . COLONOSCOPY  2014  . COLONOSCOPY WITH PROPOFOL N/A 10/01/2016   Procedure: COLONOSCOPY WITH PROPOFOL;  Surgeon: Lucilla Lame, MD;  Location: [place];  Service: Endoscopy;  Laterality: N/A;  . CYSTOCELE REPAIR N/A 12/17/2016   Procedure: ANTERIOR REPAIR (CYSTOCELE);  Surgeon: Brayton Mars, MD;  Location: [facility];  Service: Gynecology;  Laterality: N/A;  . POLYPECTOMY  10/01/2016   Procedure: POLYPECTOMY;  Surgeon: Lucilla Lame, MD;  Location: [place];  Service: Endoscopy;;  . RECTAL SURGERY  06/28/2015   Rectal  prolapse, laparoscopic rectopexy the coldCharmont, MD; [facility]  . TUBAL LIGATION    . VAGINAL HYSTERECTOMY Bilateral 12/17/2016   Procedure: HYSTERECTOMY VAGINAL WITH BILATERAL SALPINGO OOPHERECTOMY;  Surgeon: Brayton Mars, MD;  Location: [facility];  Service: Gynecology;  Laterality: Bilateral;    Family History  Problem Relation Age of Onset  . Diabetes Mother   . Diabetes Sister   . Diabetes Brother   . Lymphoma Brother   . Breast cancer Neg Hx   . Ovarian cancer Neg Hx   . Colon cancer Neg Hx     Social History   Social History  . Marital status: Married    Spouse name: N/A  . Number of children: N/A  . Years of education: N/A   Occupational History  . Not on file.   Social History Main Topics  . Smoking status: Current Every Day Smoker    Packs/day: 0.50    Years: 45.00    Types: Cigarettes  . Smokeless tobacco: Never Used  . Alcohol use No  . Drug use: No  . Sexual activity: Not Currently   Other Topics Concern  . Not on file   Social History Narrative  . No narrative on file     Current Outpatient Prescriptions:  .  aspirin 81 MG tablet, Take 81 mg by mouth daily. , Disp: , Rfl:  .  clonazePAM (  KLONOPIN) 1 MG tablet, Take 1 tablet (1 mg total) by mouth 3 (three) times daily as needed for anxiety., Disp: 90 tablet, Rfl: 0 .  docusate sodium (COLACE) 100 MG capsule, Take 1 capsule (100 mg total) by mouth 2 (two) times daily., Disp: 10 capsule, Rfl: 0 .  HYDROcodone-acetaminophen (NORCO) 10-325 MG tablet, Take 1 tablet by mouth every 8 (eight) hours as needed., Disp: 90 tablet, Rfl: 0 .  ibuprofen (ADVIL,MOTRIN) 800 MG tablet, Take 1 tablet (800 mg total) by mouth 3 (three) times daily., Disp: 50 tablet, Rfl: 1 .  omeprazole (PRILOSEC) 40 MG capsule, Take 1 capsule (40 mg total) by mouth daily., Disp: 90 capsule, Rfl: 1 .  polyethylene glycol (MIRALAX / GLYCOLAX) packet, Take 17 g by mouth daily., Disp: , Rfl:  .  senna (SENOKOT) 8.6 MG TABS tablet, Take 2  tablets by mouth daily as needed for mild constipation., Disp: , Rfl:  .  venlafaxine XR (EFFEXOR-XR) 150 MG 24 hr capsule, Take 1 capsule (150 mg total) by mouth at bedtime., Disp: 90 capsule, Rfl: 1 .  venlafaxine XR (EFFEXOR-XR) 75 MG 24 hr capsule, Take 1 capsule (75 mg total) by mouth at bedtime., Disp: 90 capsule, Rfl: 1  No Known Allergies   Review of Systems  Constitutional: Positive for irritability.  Respiratory: Negative for shortness of breath.   Musculoskeletal: Positive for back pain.  Neurological: Negative for tingling, numbness and paresthesias.  Psychiatric/Behavioral: The patient is nervous/anxious.       Objective  Vitals:   04/25/17 0818  BP: 122/68  Pulse: 88  Resp: 18  Temp: 98.2 F (36.8 C)  TempSrc: Oral  SpO2: 96%  Weight: 168 lb 9.6 oz (76.5 kg)  Height: 5\' 2"  (1.575 m)    Physical Exam  Constitutional: She is oriented to person, place, and time and well-developed, well-nourished, and in no distress.  HENT:  Head: Normocephalic and atraumatic.  Cardiovascular: Normal rate, regular rhythm and normal heart sounds.   No murmur heard. Pulmonary/Chest: Effort normal and breath sounds normal. She has no wheezes.  Abdominal: Soft. Bowel sounds are normal. There is no tenderness.  Musculoskeletal:       Lumbar back: She exhibits tenderness, pain and spasm.       Back:  Neurological: She is alert and oriented to person, place, and time.  Psychiatric: Mood, memory, affect and judgment normal.  Nursing note and vitals reviewed.    Assessment & Plan  1. Generalized anxiety disorder Stable, responsive to clonazepam taken up to 3 times daily as needed, refills provided - clonazePAM (KLONOPIN) 1 MG tablet; Take 1 tablet (1 mg total) by mouth 3 (three) times daily as needed for anxiety.  Dispense: 90 tablet; Refill: 0  2. Chronic right-sided low back pain without sciatica Symptoms stable and controlled on opioid therapy, patient compliant with  controlled substances agreement - HYDROcodone-acetaminophen (NORCO) 10-325 MG tablet; Take 1 tablet by mouth every 8 (eight) hours as needed.  Dispense: 90 tablet; Refill: 0   Lucely Leard [facility] 04/25/2017 8:31 AM

## 2017-05-13 ENCOUNTER — Ambulatory Visit (INDEPENDENT_AMBULATORY_CARE_PROVIDER_SITE_OTHER): Payer: Medicare Other

## 2017-05-13 VITALS — BP 122/82 | HR 92 | Temp 98.2°F | Ht 62.0 in | Wt 172.0 lb

## 2017-05-13 DIAGNOSIS — Z0000 Encounter for general adult medical examination without abnormal findings: Secondary | ICD-10-CM

## 2017-05-13 NOTE — Patient Instructions (Signed)
Kim Buchanan , Thank you for taking time to come for your Medicare Wellness Visit. I appreciate your ongoing commitment to your health goals. Please review the following plan we discussed and let me know if I can assist you in the future.   Screening recommendations/referrals: Colonoscopy: completed 10/01/16, due 09/2026 Mammogram: declined Bone Density: declined Recommended yearly ophthalmology/optometry visit for glaucoma screening and checkup Recommended yearly dental visit for hygiene and checkup  Vaccinations: Influenza vaccine: due 06/2017 Pneumococcal vaccine: due at age 45 Tdap vaccine: completed 05/13/14, due 04/2024 Shingles vaccine: declined    Advanced directives: Please bring a copy of your POA (Power of [place]) and/or Living Will to your next appointment.   Conditions/risks identified: Smoking cessation; Obesity; Recommend cutting out bad carbohydrates in daily diet.   Next appointment: 05/24/17 @ 11:20 AM  Preventive Care 40-64 Years, Female Preventive care refers to lifestyle choices and visits with your health care provider that can promote health and wellness. What does preventive care include?  A yearly physical exam. This is also called an annual well check.  Dental exams once or twice a year.  Routine eye exams. Ask your health care provider how often you should have your eyes checked.  Personal lifestyle choices, including:  Daily care of your teeth and gums.  Regular physical activity.  Eating a healthy diet.  Avoiding tobacco and drug use.  Limiting alcohol use.  Practicing safe sex.  Taking low-dose aspirin daily starting at age 28.  Taking vitamin and mineral supplements as recommended by your health care provider. What happens during an annual well check? The services and screenings done by your health care provider during your annual well check will depend on your age, overall health, lifestyle risk factors, and family history of  disease. Counseling  Your health care provider may ask you questions about your:  Alcohol use.  Tobacco use.  Drug use.  Emotional well-being.  Home and relationship well-being.  Sexual activity.  Eating habits.  Work and work Statistician.  Method of birth control.  Menstrual cycle.  Pregnancy history. Screening  You may have the following tests or measurements:  Height, weight, and BMI.  Blood pressure.  Lipid and cholesterol levels. These may be checked every 5 years, or more frequently if you are over 82 years old.  Skin check.  Lung cancer screening. You may have this screening every year starting at age 34 if you have a 30-pack-year history of smoking and currently smoke or have quit within the past 15 years.  Fecal occult blood test (FOBT) of the stool. You may have this test every year starting at age 5.  Flexible sigmoidoscopy or colonoscopy. You may have a sigmoidoscopy every 5 years or a colonoscopy every 10 years starting at age 88.  Hepatitis C blood test.  Hepatitis B blood test.  Sexually transmitted disease (STD) testing.  Diabetes screening. This is done by checking your blood sugar (glucose) after you have not eaten for a while (fasting). You may have this done every 1-3 years.  Mammogram. This may be done every 1-2 years. Talk to your health care provider about when you should start having regular mammograms. This may depend on whether you have a family history of breast cancer.  BRCA-related cancer screening. This may be done if you have a family history of breast, ovarian, tubal, or peritoneal cancers.  Pelvic exam and Pap test. This may be done every 3 years starting at age 57. Starting at age 14, this 74  be done every 5 years if you have a Pap test in combination with an HPV test.  Bone density scan. This is done to screen for osteoporosis. You may have this scan if you are at high risk for osteoporosis. Discuss your test results,  treatment options, and if necessary, the need for more tests with your health care provider. Vaccines  Your health care provider may recommend certain vaccines, such as:  Influenza vaccine. This is recommended every year.  Tetanus, diphtheria, and acellular pertussis (Tdap, Td) vaccine. You may need a Td booster every 10 years.  Zoster vaccine. You may need this after age 43.  Pneumococcal 13-valent conjugate (PCV13) vaccine. You may need this if you have certain conditions and were not previously vaccinated.  Pneumococcal polysaccharide (PPSV23) vaccine. You may need one or two doses if you smoke cigarettes or if you have certain conditions. Talk to your health care provider about which screenings and vaccines you need and how often you need them. This information is not intended to replace advice given to you by your health care provider. Make sure you discuss any questions you have with your health care provider. Document Released: 11/04/2015 Document Revised: 06/27/2016 Document Reviewed: 08/09/2015 Elsevier Interactive Patient Education  2017 [place] Prevention in the Home Falls can cause injuries. They can happen to people of all ages. There are many things you can do to make your home safe and to help prevent falls. What can I do on the outside of my home?  Regularly fix the edges of walkways and driveways and fix any cracks.  Remove anything that might make you trip as you walk through a door, such as a raised step or threshold.  Trim any bushes or trees on the path to your home.  Use bright outdoor lighting.  Clear any walking paths of anything that might make someone trip, such as rocks or tools.  Regularly check to see if handrails are loose or broken. Make sure that both sides of any steps have handrails.  Any raised decks and porches should have guardrails on the edges.  Have any leaves, snow, or ice cleared regularly.  Use sand or salt on walking  paths during winter.  Clean up any spills in your garage right away. This includes oil or grease spills. What can I do in the bathroom?  Use night lights.  Install grab bars by the toilet and in the tub and shower. Do not use towel bars as grab bars.  Use non-skid mats or decals in the tub or shower.  If you need to sit down in the shower, use a plastic, non-slip stool.  Keep the floor dry. Clean up any water that spills on the floor as soon as it happens.  Remove soap buildup in the tub or shower regularly.  Attach bath mats securely with double-sided non-slip rug tape.  Do not have throw rugs and other things on the floor that can make you trip. What can I do in the bedroom?  Use night lights.  Make sure that you have a light by your bed that is easy to reach.  Do not use any sheets or blankets that are too big for your bed. They should not hang down onto the floor.  Have a firm chair that has side arms. You can use this for support while you get dressed.  Do not have throw rugs and other things on the floor that can make you trip.  What can I do in the kitchen?  Clean up any spills right away.  Avoid walking on wet floors.  Keep items that you use a lot in easy-to-reach places.  If you need to reach something above you, use a strong step stool that has a grab bar.  Keep electrical cords out of the way.  Do not use floor polish or wax that makes floors slippery. If you must use wax, use non-skid floor wax.  Do not have throw rugs and other things on the floor that can make you trip. What can I do with my stairs?  Do not leave any items on the stairs.  Make sure that there are handrails on both sides of the stairs and use them. Fix handrails that are broken or loose. Make sure that handrails are as long as the stairways.  Check any carpeting to make sure that it is firmly attached to the stairs. Fix any carpet that is loose or worn.  Avoid having throw rugs at  the top or bottom of the stairs. If you do have throw rugs, attach them to the floor with carpet tape.  Make sure that you have a light switch at the top of the stairs and the bottom of the stairs. If you do not have them, ask someone to add them for you. What else can I do to help prevent falls?  Wear shoes that:  Do not have high heels.  Have rubber bottoms.  Are comfortable and fit you well.  Are closed at the toe. Do not wear sandals.  If you use a stepladder:  Make sure that it is fully opened. Do not climb a closed stepladder.  Make sure that both sides of the stepladder are locked into place.  Ask someone to hold it for you, if possible.  Clearly mark and make sure that you can see:  Any grab bars or handrails.  First and last steps.  Where the edge of each step is.  Use tools that help you move around (mobility aids) if they are needed. These include:  Canes.  Walkers.  Scooters.  Crutches.  Turn on the lights when you go into a dark area. Replace any light bulbs as soon as they burn out.  Set up your furniture so you have a clear path. Avoid moving your furniture around.  If any of your floors are uneven, fix them.  If there are any pets around you, be aware of where they are.  Review your medicines with your doctor. Some medicines can make you feel dizzy. This can increase your chance of falling. Ask your doctor what other things that you can do to help prevent falls. This information is not intended to replace advice given to you by your health care provider. Make sure you discuss any questions you have with your health care provider. Document Released: 08/04/2009 Document Revised: 03/15/2016 Document Reviewed: 11/12/2014 Elsevier Interactive Patient Education  2017 [employer].

## 2017-05-13 NOTE — Progress Notes (Signed)
Subjective:   Kim Buchanan is a 66 y.o. female who presents for Medicare Annual (Subsequent) preventive examination.  Review of Systems:  N/A  Cardiac Risk Factors include: advanced age (>3men, >8 women);smoking/ tobacco exposure;obesity (BMI >30kg/m2)     Objective:     Vitals: BP 122/82 (BP Location: Left Arm)   Pulse 92   Temp 98.2 F (36.8 C) (Oral)   Ht 5\' 2"  (1.575 m)   Wt 172 lb (78 kg)   BMI 31.46 kg/m   Body mass index is 31.46 kg/m.   Tobacco History  Smoking Status  . Current Every Day Smoker  . Packs/day: 0.50  . Years: 45.00  . Types: Cigarettes  Smokeless Tobacco  . Never Used     Ready to quit: No Counseling given: Not Answered   Past Medical History:  Diagnosis Date  . Anxiety   . Arthritis    lower back  . Depression   . GERD (gastroesophageal reflux disease)   . Hyperlipidemia   . Leaky heart valve    sees Dr Humphrey Rolls  . Panic attack   . Rectal fistula   . Sleep apnea   . Wears dentures    full upper   Past Surgical History:  Procedure Laterality Date  . COLONOSCOPY  2014  . COLONOSCOPY WITH PROPOFOL N/A 10/01/2016   Procedure: COLONOSCOPY WITH PROPOFOL;  Surgeon: Lucilla Lame, MD;  Location: [place];  Service: Endoscopy;  Laterality: N/A;  . CYSTOCELE REPAIR N/A 12/17/2016   Procedure: ANTERIOR REPAIR (CYSTOCELE);  Surgeon: Brayton Mars, MD;  Location: [facility];  Service: Gynecology;  Laterality: N/A;  . POLYPECTOMY  10/01/2016   Procedure: POLYPECTOMY;  Surgeon: Lucilla Lame, MD;  Location: [place];  Service: Endoscopy;;  . RECTAL SURGERY  06/28/2015   Rectal prolapse, laparoscopic rectopexy the coldCharmont, MD; [facility]  . TUBAL LIGATION    . VAGINAL HYSTERECTOMY Bilateral 12/17/2016   Procedure: HYSTERECTOMY VAGINAL WITH BILATERAL SALPINGO OOPHERECTOMY;  Surgeon: Brayton Mars, MD;  Location: [facility];  Service: Gynecology;  Laterality: Bilateral;   Family History  Problem Relation Age of  Onset  . Diabetes Mother   . Diabetes Sister   . Diabetes Brother   . Lymphoma Brother   . Breast cancer Neg Hx   . Ovarian cancer Neg Hx   . Colon cancer Neg Hx    History  Sexual Activity  . Sexual activity: Not Currently    Outpatient Encounter Prescriptions as of 05/13/2017  Medication Sig  . aspirin 81 MG tablet Take 81 mg by mouth daily.   . clonazePAM (KLONOPIN) 1 MG tablet Take 1 tablet (1 mg total) by mouth 3 (three) times daily as needed for anxiety.  . docusate sodium (COLACE) 100 MG capsule Take 1 capsule (100 mg total) by mouth 2 (two) times daily.  Marland Kitchen HYDROcodone-acetaminophen (NORCO/VICODIN) 5-325 MG tablet Take 1 tablet by mouth every 6 (six) hours as needed for moderate pain.  Marland Kitchen ibuprofen (ADVIL,MOTRIN) 800 MG tablet Take 1 tablet (800 mg total) by mouth 3 (three) times daily.  Marland Kitchen omeprazole (PRILOSEC) 40 MG capsule Take 1 capsule (40 mg total) by mouth daily.  . polyethylene glycol (MIRALAX / GLYCOLAX) packet Take 17 g by mouth daily.  Marland Kitchen senna (SENOKOT) 8.6 MG TABS tablet Take 2 tablets by mouth daily as needed for mild constipation.  Marland Kitchen venlafaxine XR (EFFEXOR-XR) 150 MG 24 hr capsule Take 1 capsule (150 mg total) by mouth at bedtime.  Marland Kitchen venlafaxine XR (  EFFEXOR-XR) 75 MG 24 hr capsule Take 1 capsule (75 mg total) by mouth at bedtime.  . [DISCONTINUED] HYDROcodone-acetaminophen (NORCO) 10-325 MG tablet Take 1 tablet by mouth every 8 (eight) hours as needed.   No facility-administered encounter medications on file as of 05/13/2017.     Activities of Daily Living In your present state of health, do you have any difficulty performing the following activities: 05/13/2017 03/26/2017  Hearing? Tempie Donning  Vision? Y Y  Difficulty concentrating or making decisions? Y N  Walking or climbing stairs? N N  Dressing or bathing? N N  Doing errands, shopping? N N  Preparing Food and eating ? N -  Using the Toilet? N -  In the past six months, have you accidently leaked urine? N -  Do  you have problems with loss of bowel control? N -  Managing your Medications? N -  Managing your Finances? N -  Housekeeping or managing your Housekeeping? N -  Some recent data might be hidden    Patient Care Team: Roselee Nova, MD as PCP - General (Family Medicine) Defrancesco, Alanda Slim, MD as Consulting Physician (Obstetrics and Gynecology) [name], OD as Consulting Physician (Optometry)    Assessment:     Exercise Activities and Dietary recommendations Current Exercise Habits: The patient does not participate in regular exercise at present (occasional stretching), Exercise limited by: orthopedic condition(s) (back pain)  Goals    . Weight (lb) < 150 lb (68 kg)          Recommend cutting out bad carbohydrates in daily diet. Pt to focus on healthy carbs and spreading out servings in diet.       Fall Risk Fall Risk  05/13/2017 03/26/2017 02/22/2017 11/28/2016 08/31/2016  Falls in the past year? No No No No No   Depression Screen PHQ 2/9 Scores 05/13/2017 03/26/2017 02/22/2017 11/28/2016  PHQ - 2 Score 1 0 0 0     Cognitive Function     6CIT Screen 05/13/2017  What Year? 0 points  What month? 0 points  What time? 0 points  Count back from 20 0 points  Months in reverse 0 points  Repeat phrase 2 points  Total Score 2    Immunization History  Administered Date(s) Administered  . Influenza, Seasonal, Injecte, Preservative Fre 08/18/2008, 07/14/2014  . Influenza,inj,Quad PF,36+ Mos 09/25/2013, 08/05/2015, 08/02/2016  . Tdap 08/08/2009   Screening Tests Health Maintenance  Topic Date Due  . Hepatitis C Screening  [birth year]  . HIV Screening  02/11/1966  . MAMMOGRAM  04/21/2018 (Originally 05/21/2016)  . INFLUENZA VACCINE  05/22/2017  . TETANUS/TDAP  08/09/2019  . PAP SMEAR  11/01/2019  . COLONOSCOPY  10/01/2021      Plan:  I have personally reviewed and addressed the Medicare Annual Wellness questionnaire and have noted the following in the patient's chart:    A. Medical and social history B. Use of alcohol, tobacco or illicit drugs  C. Current medications and supplements D. Functional ability and status E.  Nutritional status F.  Physical activity G. Advance directives H. List of other physicians I.  Hospitalizations, surgeries, and ER visits in previous 12 months J.  [place] such as hearing and vision if needed, cognitive and depression L. Referrals and appointments - none  In addition, I have reviewed and discussed with patient certain preventive protocols, quality metrics, and best practice recommendations. A written personalized care plan for preventive services as well as general preventive health recommendations  were provided to patient.  See attached scanned questionnaire for additional information.   Signed,  Fabio Neighbors, LPN Nurse Health Advisor   MD Recommendations: Pt declined setting up a mammogram this year. Pt would like HIV and Hepatitis C screening done at next OV on 05/24/17, to avoid more than 1 blood draw.

## 2017-05-24 ENCOUNTER — Encounter: Payer: Self-pay | Admitting: Family Medicine

## 2017-05-24 ENCOUNTER — Ambulatory Visit (INDEPENDENT_AMBULATORY_CARE_PROVIDER_SITE_OTHER): Payer: Medicare Other | Admitting: Family Medicine

## 2017-05-24 VITALS — BP 124/76 | HR 90 | Temp 98.2°F | Resp 16 | Ht 62.0 in | Wt 168.5 lb

## 2017-05-24 DIAGNOSIS — M545 Low back pain: Secondary | ICD-10-CM | POA: Diagnosis not present

## 2017-05-24 DIAGNOSIS — F411 Generalized anxiety disorder: Secondary | ICD-10-CM | POA: Diagnosis not present

## 2017-05-24 DIAGNOSIS — G8929 Other chronic pain: Secondary | ICD-10-CM | POA: Diagnosis not present

## 2017-05-24 MED ORDER — CLONAZEPAM 1 MG PO TABS: 1.0000 mg | ORAL_TABLET | Freq: Three times a day (TID) | ORAL | 0 refills | Status: DC | PRN

## 2017-05-24 MED ORDER — HYDROCODONE-ACETAMINOPHEN 7.5-300 MG PO TABS: 1.0000 | ORAL_TABLET | Freq: Three times a day (TID) | ORAL | 0 refills | Status: AC | PRN

## 2017-05-24 NOTE — Progress Notes (Addendum)
Name: Kim Buchanan   MRN: [id number]    DOB: December 01, 1950   Date:05/24/2017       Progress Note  Subjective  Chief Complaint  Chief Complaint  Patient presents with  . Follow-up    1 mo  . Medication Refill  . Hyperlipidemia  . Gastroesophageal Reflux    Back Pain  This is a chronic problem. The current episode started more than 1 year ago. The problem has been gradually worsening since onset. The pain is present in the lumbar spine. The pain radiates to the left thigh (a few nights ago, she had pain radiating down her left thigh, had to use biofreeze.). The pain is at a severity of 2/10. The pain is moderate. The symptoms are aggravated by position, bending and sitting (worse with vacuuming her floors and doing other household work.). Stiffness is present in the morning. Pertinent negatives include no bladder incontinence, bowel incontinence, leg pain, numbness, paresthesias or tingling. She has tried analgesics for the symptoms.  Anxiety  Presents for follow-up visit. Symptoms include excessive worry, irritability and nervous/anxious behavior. Patient reports no depressed mood, insomnia, panic or shortness of breath. The severity of symptoms is moderate and causing significant distress. The quality of sleep is fair.      Past Medical History:  Diagnosis Date  . Anxiety   . Arthritis    lower back  . Depression   . GERD (gastroesophageal reflux disease)   . Hyperlipidemia   . Leaky heart valve    sees Dr Humphrey Rolls  . Panic attack   . Rectal fistula   . Sleep apnea   . Wears dentures    full upper    Past Surgical History:  Procedure Laterality Date  . COLONOSCOPY  2014  . COLONOSCOPY WITH PROPOFOL N/A 10/01/2016   Procedure: COLONOSCOPY WITH PROPOFOL;  Surgeon: Lucilla Lame, MD;  Location: [place];  Service: Endoscopy;  Laterality: N/A;  . CYSTOCELE REPAIR N/A 12/17/2016   Procedure: ANTERIOR REPAIR (CYSTOCELE);  Surgeon: Brayton Mars, MD;  Location: [facility]  [facility];  Service: Gynecology;  Laterality: N/A;  . POLYPECTOMY  10/01/2016   Procedure: POLYPECTOMY;  Surgeon: Lucilla Lame, MD;  Location: [place];  Service: Endoscopy;;  . RECTAL SURGERY  06/28/2015   Rectal prolapse, laparoscopic rectopexy the coldCharmont, MD; [facility]  . TUBAL LIGATION    . VAGINAL HYSTERECTOMY Bilateral 12/17/2016   Procedure: HYSTERECTOMY VAGINAL WITH BILATERAL SALPINGO OOPHERECTOMY;  Surgeon: Brayton Mars, MD;  Location: [facility]S;  Service: Gynecology;  Laterality: Bilateral;    Family History  Problem Relation Age of Onset  . Diabetes Mother   . Diabetes Sister   . Diabetes Brother   . Lymphoma Brother   . Breast cancer Neg Hx   . Ovarian cancer Neg Hx   . Colon cancer Neg Hx     Social History   Social History  . Marital status: Married    Spouse name: N/A  . Number of children: N/A  . Years of education: N/A   Occupational History  . Not on file.   Social History Main Topics  . Smoking status: Current Every Day Smoker    Packs/day: 0.50    Years: 45.00    Types: Cigarettes  . Smokeless tobacco: Never Used  . Alcohol use No  . Drug use: No  . Sexual activity: Not Currently   Other Topics Concern  . Not on file   Social History Narrative  . No narrative on file  Current Outpatient Prescriptions:  .  aspirin 81 MG tablet, Take 81 mg by mouth daily. , Disp: , Rfl:  .  clonazePAM (KLONOPIN) 1 MG tablet, Take 1 tablet (1 mg total) by mouth 3 (three) times daily as needed for anxiety., Disp: 90 tablet, Rfl: 0 .  docusate sodium (COLACE) 100 MG capsule, Take 1 capsule (100 mg total) by mouth 2 (two) times daily., Disp: 10 capsule, Rfl: 0 .  HYDROcodone-acetaminophen (NORCO/VICODIN) 5-325 MG tablet, Take 1 tablet by mouth every 6 (six) hours as needed for moderate pain., Disp: , Rfl:  .  ibuprofen (ADVIL,MOTRIN) 800 MG tablet, Take 1 tablet (800 mg total) by mouth 3 (three) times daily., Disp: 50 tablet, Rfl: 1 .  omeprazole  (PRILOSEC) 40 MG capsule, Take 1 capsule (40 mg total) by mouth daily., Disp: 90 capsule, Rfl: 1 .  polyethylene glycol (MIRALAX / GLYCOLAX) packet, Take 17 g by mouth daily., Disp: , Rfl:  .  senna (SENOKOT) 8.6 MG TABS tablet, Take 2 tablets by mouth daily as needed for mild constipation., Disp: , Rfl:  .  venlafaxine XR (EFFEXOR-XR) 150 MG 24 hr capsule, Take 1 capsule (150 mg total) by mouth at bedtime., Disp: 90 capsule, Rfl: 1 .  venlafaxine XR (EFFEXOR-XR) 75 MG 24 hr capsule, Take 1 capsule (75 mg total) by mouth at bedtime., Disp: 90 capsule, Rfl: 1  No Known Allergies   Review of Systems  Constitutional: Positive for irritability.  Respiratory: Negative for shortness of breath.   Gastrointestinal: Negative for bowel incontinence.  Genitourinary: Negative for bladder incontinence.  Musculoskeletal: Positive for back pain.  Neurological: Negative for tingling, numbness and paresthesias.  Psychiatric/Behavioral: The patient is nervous/anxious. The patient does not have insomnia.       Objective  Vitals:   05/24/17 1105  BP: 124/76  Pulse: 90  Resp: 16  Temp: 98.2 F (36.8 C)  TempSrc: Oral  SpO2: 97%  Weight: 168 lb 8 oz (76.4 kg)  Height: 5\' 2"  (1.575 m)    Physical Exam  Constitutional: She is oriented to person, place, and time and well-developed, well-nourished, and in no distress.  HENT:  Head: Normocephalic and atraumatic.  Cardiovascular: Normal rate, regular rhythm and normal heart sounds.   No murmur heard. Pulmonary/Chest: Effort normal and breath sounds normal. She has no wheezes.  Musculoskeletal:       Lumbar back: She exhibits tenderness, pain and spasm.       Back:  Neurological: She is alert and oriented to person, place, and time.  Psychiatric: Mood, memory, affect and judgment normal.  Nursing note and vitals reviewed.    Assessment & Plan  1. Generalized anxiety disorder Stable and responsive to clonazepam taken up to 3 times daily as  needed, patient compliant with controlled substances agreement and understands the dependence potential, side effects and drug interactions of benzodiazepines. Refills provided and follow-up in one month - clonazePAM (KLONOPIN) 1 MG tablet; Take 1 tablet (1 mg total) by mouth 3 (three) times daily as needed for anxiety.  Dispense: 90 tablet; Refill: 0  2. Chronic bilateral low back pain without sciatica Worsening pain, not responsive to current dose of hydrocodone, we will increase to 7.5 mg taken up to 3 times daily as needed, follow-up in one month - Hydrocodone-Acetaminophen 7.5-300 MG TABS; Take 1 tablet by mouth every 8 (eight) hours as needed.  Dispense: 180 each; Refill: 0   Froylan Hobby Asad A[facility] 05/24/2017 11:27 AM

## 2017-05-28 DIAGNOSIS — K219 Gastro-esophageal reflux disease without esophagitis: Secondary | ICD-10-CM | POA: Diagnosis not present

## 2017-05-28 DIAGNOSIS — R079 Chest pain, unspecified: Secondary | ICD-10-CM | POA: Diagnosis not present

## 2017-05-28 DIAGNOSIS — E785 Hyperlipidemia, unspecified: Secondary | ICD-10-CM | POA: Diagnosis not present

## 2017-05-28 DIAGNOSIS — G4730 Sleep apnea, unspecified: Secondary | ICD-10-CM | POA: Diagnosis not present

## 2017-05-28 DIAGNOSIS — Z7982 Long term (current) use of aspirin: Secondary | ICD-10-CM | POA: Diagnosis not present

## 2017-05-28 DIAGNOSIS — I447 Left bundle-branch block, unspecified: Secondary | ICD-10-CM | POA: Diagnosis not present

## 2017-05-28 DIAGNOSIS — S2020XA Contusion of thorax, unspecified, initial encounter: Secondary | ICD-10-CM | POA: Diagnosis not present

## 2017-05-28 DIAGNOSIS — N644 Mastodynia: Secondary | ICD-10-CM | POA: Diagnosis not present

## 2017-05-28 DIAGNOSIS — I2510 Atherosclerotic heart disease of native coronary artery without angina pectoris: Secondary | ICD-10-CM | POA: Diagnosis not present

## 2017-06-20 ENCOUNTER — Encounter: Payer: Self-pay | Admitting: Family Medicine

## 2017-06-20 ENCOUNTER — Ambulatory Visit (INDEPENDENT_AMBULATORY_CARE_PROVIDER_SITE_OTHER): Payer: Medicare Other | Admitting: Family Medicine

## 2017-06-20 VITALS — BP 124/78 | HR 95 | Temp 99.0°F | Resp 17 | Ht 62.0 in | Wt 164.5 lb

## 2017-06-20 DIAGNOSIS — S20212D Contusion of left front wall of thorax, subsequent encounter: Secondary | ICD-10-CM

## 2017-06-20 DIAGNOSIS — F411 Generalized anxiety disorder: Secondary | ICD-10-CM | POA: Diagnosis not present

## 2017-06-20 MED ORDER — CLONAZEPAM 1 MG PO TABS: 1.0000 mg | ORAL_TABLET | Freq: Three times a day (TID) | ORAL | 0 refills | Status: DC | PRN
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
## 2017-06-26 ENCOUNTER — Telehealth: Payer: Self-pay | Admitting: Family Medicine

## 2017-06-26 NOTE — Telephone Encounter (Signed)
CLOSE

## 2017-07-19 ENCOUNTER — Emergency Department
Admission: EM | Admit: 2017-07-19 | Discharge: 2017-07-19 | Disposition: A | Discharge status: Home / Self Care | Payer: Medicare Other | Attending: Emergency Medicine | Admitting: Emergency Medicine

## 2017-07-19 ENCOUNTER — Encounter: Payer: Self-pay | Admitting: *Deleted

## 2017-07-19 ENCOUNTER — Emergency Department: Payer: Medicare Other

## 2017-07-19 ENCOUNTER — Ambulatory Visit (INDEPENDENT_AMBULATORY_CARE_PROVIDER_SITE_OTHER): Payer: Medicare Other | Admitting: Family Medicine

## 2017-07-19 ENCOUNTER — Encounter: Payer: Self-pay | Admitting: Family Medicine

## 2017-07-19 VITALS — BP 140/100 | HR 98 | Resp 16 | Wt 163.2 lb

## 2017-07-19 DIAGNOSIS — Z7982 Long term (current) use of aspirin: Secondary | ICD-10-CM | POA: Insufficient documentation

## 2017-07-19 DIAGNOSIS — Z23 Encounter for immunization: Secondary | ICD-10-CM | POA: Diagnosis not present

## 2017-07-19 DIAGNOSIS — R778 Other specified abnormalities of plasma proteins: Secondary | ICD-10-CM

## 2017-07-19 DIAGNOSIS — M545 Low back pain, unspecified: Secondary | ICD-10-CM

## 2017-07-19 DIAGNOSIS — R7989 Other specified abnormal findings of blood chemistry: Secondary | ICD-10-CM

## 2017-07-19 DIAGNOSIS — R0789 Other chest pain: Secondary | ICD-10-CM | POA: Diagnosis not present

## 2017-07-19 DIAGNOSIS — G8929 Other chronic pain: Secondary | ICD-10-CM | POA: Diagnosis not present

## 2017-07-19 DIAGNOSIS — F411 Generalized anxiety disorder: Secondary | ICD-10-CM

## 2017-07-19 DIAGNOSIS — R748 Abnormal levels of other serum enzymes: Secondary | ICD-10-CM | POA: Insufficient documentation

## 2017-07-19 DIAGNOSIS — E1169 Type 2 diabetes mellitus with other specified complication: Secondary | ICD-10-CM | POA: Insufficient documentation

## 2017-07-19 DIAGNOSIS — F17210 Nicotine dependence, cigarettes, uncomplicated: Secondary | ICD-10-CM | POA: Diagnosis not present

## 2017-07-19 DIAGNOSIS — R030 Elevated blood-pressure reading, without diagnosis of hypertension: Secondary | ICD-10-CM | POA: Diagnosis not present

## 2017-07-19 DIAGNOSIS — E7800 Pure hypercholesterolemia, unspecified: Secondary | ICD-10-CM | POA: Diagnosis not present

## 2017-07-19 DIAGNOSIS — E785 Hyperlipidemia, unspecified: Secondary | ICD-10-CM | POA: Insufficient documentation

## 2017-07-19 DIAGNOSIS — E782 Mixed hyperlipidemia: Secondary | ICD-10-CM | POA: Insufficient documentation

## 2017-07-19 DIAGNOSIS — Z79899 Other long term (current) drug therapy: Secondary | ICD-10-CM | POA: Insufficient documentation

## 2017-07-19 DIAGNOSIS — R079 Chest pain, unspecified: Secondary | ICD-10-CM | POA: Diagnosis not present

## 2017-07-19 LAB — BASIC METABOLIC PANEL
Anion gap: 11 (ref 5–15)
BUN: 18 mg/dL (ref 6–20)
CO2: 24 mmol/L (ref 22–32)
Calcium: 8.8 mg/dL — ABNORMAL LOW (ref 8.9–10.3)
Chloride: 106 mmol/L (ref 101–111)
Creatinine, Ser: 0.9 mg/dL (ref 0.44–1.00)
GFR calc Af Amer: >60 mL/min (ref >60)
GFR calc non Af Amer: >60 mL/min (ref >60)
Glucose, Bld: 105 mg/dL — ABNORMAL HIGH (ref 65–99)
Potassium: 4 mmol/L (ref 3.5–5.1)
Sodium: 141 mmol/L (ref 135–145)

## 2017-07-19 LAB — TROPONIN I
Troponin I: 0.05 ng/mL (ref <0.03)
Troponin I: 0.07 ng/mL (ref <0.03)

## 2017-07-19 LAB — CBC
HCT: 43.2 % (ref 35.0–47.0)
Hemoglobin: 15.1 g/dL (ref 12.0–16.0)
MCH: 31.3 pg (ref 26.0–34.0)
MCHC: 35.1 g/dL (ref 32.0–36.0)
MCV: 89.3 fL (ref 80.0–100.0)
Platelets: 227 10*3/uL (ref 150–440)
RBC: 4.84 MIL/uL (ref 3.80–5.20)
RDW: 14.2 % (ref 11.5–14.5)
WBC: 9.5 10*3/uL (ref 3.6–11.0)

## 2017-07-19 MED ORDER — HYDROCODONE-ACETAMINOPHEN 7.5-300 MG PO TABS: 1.0000 | ORAL_TABLET | Freq: Three times a day (TID) | ORAL | 0 refills | Status: DC | PRN

## 2017-07-19 MED ORDER — CLONAZEPAM 1 MG PO TABS: 1.0000 mg | ORAL_TABLET | Freq: Three times a day (TID) | ORAL | 0 refills | Status: DC | PRN

## 2017-07-19 NOTE — ED Notes (Signed)
This Rn was going to start an IV on pt just incase 2nd troponin came back higher. Pt and husband stated they did not want IV started because they stated no matter what the value came back as they are going to go home and call pt's cardiologist and see them. They stated they do not want pt to be admitted if troponin comes back higher.
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
## 2017-07-19 NOTE — ED Notes (Signed)
Dr. Cherylann Banas informed of troponin and pt decision about leaving.
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
## 2017-07-19 NOTE — Discharge Instructions (Signed)
Call Dr. Laurelyn Sickle office today to schedule the stress test for Monday.  (He is not in, but we spoke to his NP, Feliz Beam).  Return to the ER immediately for new or worsening pain, difficulty breathing, lightheadedness or weakness, or any other new or worsening symptoms that concern you.
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
## 2017-07-19 NOTE — ED Notes (Signed)
Date and time results received: 07/19/17 1010 (use smartphrase ".now" to insert current time)  Test: troponin Critical Value: 0.05  Name of Provider Notified: Dr Timoteo Gaul  Orders Received? Or Actions Taken?:
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
## 2017-07-22 DIAGNOSIS — I209 Angina pectoris, unspecified: Secondary | ICD-10-CM | POA: Diagnosis not present

## 2017-07-22 DIAGNOSIS — R079 Chest pain, unspecified: Secondary | ICD-10-CM | POA: Diagnosis not present

## 2017-07-22 DIAGNOSIS — I2510 Atherosclerotic heart disease of native coronary artery without angina pectoris: Secondary | ICD-10-CM | POA: Diagnosis not present

## 2017-07-23 DIAGNOSIS — R079 Chest pain, unspecified: Secondary | ICD-10-CM | POA: Diagnosis not present

## 2017-07-23 DIAGNOSIS — I2510 Atherosclerotic heart disease of native coronary artery without angina pectoris: Secondary | ICD-10-CM | POA: Diagnosis not present

## 2017-07-24 DIAGNOSIS — Z23 Encounter for immunization: Secondary | ICD-10-CM

## 2017-07-24 DIAGNOSIS — E7800 Pure hypercholesterolemia, unspecified: Secondary | ICD-10-CM | POA: Diagnosis not present

## 2017-07-29 DIAGNOSIS — I209 Angina pectoris, unspecified: Secondary | ICD-10-CM | POA: Diagnosis not present

## 2017-07-29 DIAGNOSIS — I2510 Atherosclerotic heart disease of native coronary artery without angina pectoris: Secondary | ICD-10-CM | POA: Diagnosis not present

## 2017-07-29 DIAGNOSIS — E785 Hyperlipidemia, unspecified: Secondary | ICD-10-CM | POA: Diagnosis not present

## 2017-07-30 DIAGNOSIS — R9430 Abnormal result of cardiovascular function study, unspecified: Secondary | ICD-10-CM | POA: Diagnosis not present

## 2017-08-01 DIAGNOSIS — R079 Chest pain, unspecified: Secondary | ICD-10-CM | POA: Diagnosis not present

## 2017-08-01 DIAGNOSIS — Z8249 Family history of ischemic heart disease and other diseases of the circulatory system: Secondary | ICD-10-CM | POA: Diagnosis not present

## 2017-08-01 DIAGNOSIS — E785 Hyperlipidemia, unspecified: Secondary | ICD-10-CM | POA: Diagnosis not present

## 2017-08-15 ENCOUNTER — Encounter: Payer: Self-pay | Admitting: Family Medicine

## 2017-08-15 ENCOUNTER — Ambulatory Visit (INDEPENDENT_AMBULATORY_CARE_PROVIDER_SITE_OTHER): Payer: Medicare Other | Admitting: Family Medicine

## 2017-08-15 VITALS — BP 144/88 | HR 92 | Temp 98.8°F | Resp 16 | Ht 62.0 in | Wt 165.6 lb

## 2017-08-15 DIAGNOSIS — R030 Elevated blood-pressure reading, without diagnosis of hypertension: Secondary | ICD-10-CM

## 2017-08-15 DIAGNOSIS — G8929 Other chronic pain: Secondary | ICD-10-CM

## 2017-08-15 DIAGNOSIS — K219 Gastro-esophageal reflux disease without esophagitis: Secondary | ICD-10-CM | POA: Diagnosis not present

## 2017-08-15 DIAGNOSIS — M545 Low back pain: Secondary | ICD-10-CM | POA: Diagnosis not present

## 2017-08-15 MED ORDER — HYDROCODONE-ACETAMINOPHEN 7.5-300 MG PO TABS: 1.0000 | ORAL_TABLET | Freq: Three times a day (TID) | ORAL | 0 refills | Status: DC | PRN

## 2017-08-15 MED ORDER — OMEPRAZOLE 40 MG PO CPDR: 40.0000 mg | DELAYED_RELEASE_CAPSULE | Freq: Every day | ORAL | 1 refills | Status: DC

## 2017-08-15 NOTE — Progress Notes (Signed)
Name: Kim Buchanan   MRN: [id number]    DOB: 04-Feb-1951   Date:08/15/2017       Progress Note  Subjective  Chief Complaint  Chief Complaint  Patient presents with  . Follow-up    1 month   . Medication Refill    pain med and acid reflux    Back Pain  This is a chronic problem. The current episode started more than 1 year ago. The problem is unchanged. The pain is present in the lumbar spine. The pain does not radiate. The pain is at a severity of 2/10. The pain is moderate. The symptoms are aggravated by position and bending (worse with routine household work.). Pertinent negatives include no abdominal pain, bladder incontinence, bowel incontinence, chest pain, leg pain, numbness, paresthesias or tingling. She has tried analgesics for the symptoms.  Gastroesophageal Reflux  She reports no abdominal pain, no belching, no chest pain, no coughing, no heartburn, no nausea or no sore throat. This is a chronic problem. The problem occurs constantly. The problem has been unchanged. She has tried a PPI for the symptoms. Past procedures do not include an EGD.      Past Medical History:  Diagnosis Date  . Anxiety   . Arthritis    lower back  . Depression   . GERD (gastroesophageal reflux disease)   . Hyperlipidemia   . Leaky heart valve    sees Dr Humphrey Rolls  . Panic attack   . Rectal fistula   . Sleep apnea   . Wears dentures    full upper    Past Surgical History:  Procedure Laterality Date  . COLONOSCOPY  2014  . COLONOSCOPY WITH PROPOFOL N/A 10/01/2016   Procedure: COLONOSCOPY WITH PROPOFOL;  Surgeon: Lucilla Lame, MD;  Location: [place];  Service: Endoscopy;  Laterality: N/A;  . CYSTOCELE REPAIR N/A 12/17/2016   Procedure: ANTERIOR REPAIR (CYSTOCELE);  Surgeon: Brayton Mars, MD;  Location: [facility];  Service: Gynecology;  Laterality: N/A;  . POLYPECTOMY  10/01/2016   Procedure: POLYPECTOMY;  Surgeon: Lucilla Lame, MD;  Location: [place];  Service:  Endoscopy;;  . RECTAL SURGERY  06/28/2015   Rectal prolapse, laparoscopic rectopexy the coldCharmont, MD; [facility]  . TUBAL LIGATION    . VAGINAL HYSTERECTOMY Bilateral 12/17/2016   Procedure: HYSTERECTOMY VAGINAL WITH BILATERAL SALPINGO OOPHERECTOMY;  Surgeon: Brayton Mars, MD;  Location: [facility];  Service: Gynecology;  Laterality: Bilateral;    Family History  Problem Relation Age of Onset  . Diabetes Mother   . Diabetes Sister   . Diabetes Brother   . Lymphoma Brother   . Breast cancer Neg Hx   . Ovarian cancer Neg Hx   . Colon cancer Neg Hx     Social History   Social History  . Marital status: Married    Spouse name: N/A  . Number of children: N/A  . Years of education: N/A   Occupational History  . Not on file.   Social History Main Topics  . Smoking status: Current Every Day Smoker    Packs/day: 0.50    Years: 45.00    Types: Cigarettes  . Smokeless tobacco: Never Used  . Alcohol use No  . Drug use: No  . Sexual activity: Not Currently   Other Topics Concern  . Not on file   Social History Narrative  . No narrative on file     Current Outpatient Prescriptions:  .  aspirin 81 MG tablet, Take 81 mg  by mouth daily. , Disp: , Rfl:  .  atorvastatin (LIPITOR) 40 MG tablet, Take 40 mg by mouth at bedtime., Disp: , Rfl:  .  clonazePAM (KLONOPIN) 1 MG tablet, Take 1 tablet (1 mg total) by mouth 3 (three) times daily as needed for anxiety., Disp: 90 tablet, Rfl: 0 .  docusate sodium (COLACE) 100 MG capsule, Take 1 capsule (100 mg total) by mouth 2 (two) times daily., Disp: 10 capsule, Rfl: 0 .  Hydrocodone-Acetaminophen 7.5-300 MG TABS, Take 1 tablet by mouth every 8 (eight) hours as needed., Disp: 90 each, Rfl: 0 .  ibuprofen (ADVIL,MOTRIN) 800 MG tablet, Take 1 tablet (800 mg total) by mouth 3 (three) times daily., Disp: 50 tablet, Rfl: 1 .  omeprazole (PRILOSEC) 40 MG capsule, Take 1 capsule (40 mg total) by mouth daily., Disp: 90 capsule, Rfl: 1 .   polyethylene glycol (MIRALAX / GLYCOLAX) packet, Take 17 g by mouth daily as needed. , Disp: , Rfl:  .  senna (SENOKOT) 8.6 MG TABS tablet, Take 2 tablets by mouth daily as needed for mild constipation., Disp: , Rfl:  .  venlafaxine XR (EFFEXOR-XR) 150 MG 24 hr capsule, Take 1 capsule (150 mg total) by mouth at bedtime., Disp: 90 capsule, Rfl: 1 .  venlafaxine XR (EFFEXOR-XR) 75 MG 24 hr capsule, Take 1 capsule (75 mg total) by mouth at bedtime., Disp: 90 capsule, Rfl: 1  No Known Allergies   Review of Systems  HENT: Negative for sore throat.   Respiratory: Negative for cough.   Cardiovascular: Negative for chest pain.  Gastrointestinal: Negative for abdominal pain, bowel incontinence, heartburn and nausea.  Genitourinary: Negative for bladder incontinence.  Musculoskeletal: Positive for back pain.  Neurological: Negative for tingling, numbness and paresthesias.      Objective  Vitals:   08/15/17 0920  BP: (!) 144/88  Pulse: 92  Resp: 16  Temp: 98.8 F (37.1 C)  TempSrc: Oral  SpO2: 94%  Weight: 165 lb 9.6 oz (75.1 kg)  Height: '5\' 2"'  (1.575 m)    Physical Exam  Constitutional: She is oriented to person, place, and time and well-developed, well-nourished, and in no distress.  HENT:  Head: Normocephalic and atraumatic.  Cardiovascular: Normal rate, regular rhythm and normal heart sounds.   No murmur heard. Pulmonary/Chest: Effort normal and breath sounds normal. She has no wheezes.  Abdominal: Soft. Bowel sounds are normal.  Musculoskeletal:       Lumbar back: She exhibits tenderness and pain.       Back:  Neurological: She is alert and oriented to person, place, and time.  Psychiatric: Mood, memory, affect and judgment normal.  Nursing note and vitals reviewed.      Recent Results (from the past 2160 hour(s))  Basic metabolic panel     Status: Abnormal   Collection Time: 07/19/17  9:34 AM  Result Value Ref Range   Sodium 141 135 - 145 mmol/L   Potassium 4.0  3.5 - 5.1 mmol/L   Chloride 106 101 - 111 mmol/L   CO2 24 22 - 32 mmol/L   Glucose, Bld 105 (H) 65 - 99 mg/dL   BUN 18 6 - 20 mg/dL   Creatinine, Ser 0.90 0.44 - 1.00 mg/dL   Calcium 8.8 (L) 8.9 - 10.3 mg/dL   GFR calc non Af Amer >60 >60 mL/min   GFR calc Af Amer >60 >60 mL/min    Comment: (NOTE) The eGFR has been calculated using the CKD EPI equation. This calculation has not  been validated in all clinical situations. eGFR's persistently <60 mL/min signify possible Chronic Kidney Disease.    Anion gap 11 5 - 15  CBC     Status: None   Collection Time: 07/19/17  9:34 AM  Result Value Ref Range   WBC 9.5 3.6 - 11.0 K/uL   RBC 4.84 3.80 - 5.20 MIL/uL   Hemoglobin 15.1 12.0 - 16.0 g/dL   HCT 43.2 35.0 - 47.0 %   MCV 89.3 80.0 - 100.0 fL   MCH 31.3 26.0 - 34.0 pg   MCHC 35.1 32.0 - 36.0 g/dL   RDW 14.2 11.5 - 14.5 %   Platelets 227 150 - 440 K/uL  Troponin I     Status: Abnormal   Collection Time: 07/19/17  9:34 AM  Result Value Ref Range   Troponin I 0.05 (HH) <0.03 ng/mL    Comment: CRITICAL RESULT CALLED TO, READ BACK BY AND VERIFIED WITH  HEATHER FISHER AT 1011 07/19/17 SDR   Troponin I     Status: Abnormal   Collection Time: 07/19/17 12:31 PM  Result Value Ref Range   Troponin I 0.07 (HH) <0.03 ng/mL    Comment: CRITICAL VALUE NOTED. VALUE IS CONSISTENT WITH PREVIOUSLY REPORTED/CALLED VALUE  SDR      Assessment & Plan  1. Gastroesophageal reflux disease, esophagitis presence not specified Symptoms of acid reflux are stable on PPI - omeprazole (PRILOSEC) 40 MG capsule; Take 1 capsule (40 mg total) by mouth daily.  Dispense: 90 capsule; Refill: 1  2. Chronic right-sided low back pain without sciatica She has chronic right-sided low back pain, responsive to opioid treatment, compliant with controlled substance agreement and explained that after my departure prophylaxis, she will be referred to pain clinic for follow-up and she verbalized agreement -  Hydrocodone-Acetaminophen 7.5-300 MG TABS; Take 1 tablet by mouth every 8 (eight) hours as needed.  Dispense: 90 each; Refill: 0 - Ambulatory referral to Pain Clinic  3. Elevated BP without diagnosis of hypertension She is being followed by cardiology, advised for follow-up of blood pressure  Haylo Fake [facility] 08/15/2017 10:02 AM

## 2017-08-15 NOTE — Progress Notes (Signed)
The following letter was provided to Kim Buchanan during their visit today: ---------------------------------------  Dear valued [facility] Patient,  I am writing to share that as of December 06, 2017, I will no longer be seeing patients at [facility]. While it has been my privilege to care for you as a physician, I have decided to move outside of [place] to pursue other opportunities.  The staff at [facility] has been supportive of my decision and are supportive of any patients who have been under my care.  They will be happy to provide care to you and your family.  The office staff will do everything they can to ensure a seamless transition of care at [facility]  However if you are on any controlled substance medications (i.e. Pain medication or benzodiazepines), they will no longer be able to refill those medications, but we will be more than happy to refer you to a specialist.  [place] center will also assist you with the transfer of medical records should you wish to seek care elsewhere.  If you have any questions about your future care, you may call the office at [phone].  I have enjoyed getting to know my patients here and I wish you the very best.  Sincerely,  Rochel Brome, MD  ---------------------------------------  A written copy of this letter was given to the patient.  The patient verbalizes understanding of the letter, and does request referral to specialist or to new primary care provider.  This decision has been conveyed to Dr. Manuella Ghazi, who will place any appropriate referrals during today's visit.

## 2017-08-19 ENCOUNTER — Encounter: Payer: Self-pay | Admitting: Cardiovascular Disease

## 2017-08-19 DIAGNOSIS — I2510 Atherosclerotic heart disease of native coronary artery without angina pectoris: Secondary | ICD-10-CM | POA: Diagnosis not present

## 2017-08-19 DIAGNOSIS — R079 Chest pain, unspecified: Secondary | ICD-10-CM | POA: Diagnosis not present

## 2017-08-19 DIAGNOSIS — E785 Hyperlipidemia, unspecified: Secondary | ICD-10-CM | POA: Diagnosis not present

## 2017-09-03 ENCOUNTER — Encounter: Payer: Self-pay | Admitting: Family Medicine

## 2017-09-03 ENCOUNTER — Ambulatory Visit (INDEPENDENT_AMBULATORY_CARE_PROVIDER_SITE_OTHER): Payer: Medicare Other | Admitting: Family Medicine

## 2017-09-03 VITALS — BP 146/82 | HR 85 | Temp 98.7°F | Resp 16 | Wt 165.0 lb

## 2017-09-03 DIAGNOSIS — R030 Elevated blood-pressure reading, without diagnosis of hypertension: Secondary | ICD-10-CM

## 2017-09-03 DIAGNOSIS — F411 Generalized anxiety disorder: Secondary | ICD-10-CM | POA: Diagnosis not present

## 2017-09-03 MED ORDER — CLONAZEPAM 1 MG PO TABS: 1.0000 mg | ORAL_TABLET | Freq: Three times a day (TID) | ORAL | 0 refills | Status: DC | PRN

## 2017-09-03 NOTE — Progress Notes (Signed)
Name: Kim Buchanan   MRN: [id number]    DOB: 1951-02-13   Date:09/03/2017       Progress Note  Subjective  Chief Complaint  Chief Complaint  Patient presents with  . Medication Refill    Anxiety  Presents for follow-up visit. Symptoms include depressed mood, excessive worry, insomnia, irritability, nervous/anxious behavior and panic. Patient reports no suicidal ideas. The severity of symptoms is moderate and causing significant distress.      Past Medical History:  Diagnosis Date  . Anxiety   . Arthritis    lower back  . Depression   . GERD (gastroesophageal reflux disease)   . Hyperlipidemia   . Leaky heart valve    sees Dr Humphrey Rolls  . Panic attack   . Rectal fistula   . Sleep apnea   . Wears dentures    full upper    Past Surgical History:  Procedure Laterality Date  . COLONOSCOPY  2014  . RECTAL SURGERY  06/28/2015   Rectal prolapse, laparoscopic rectopexy the coldCharmont, MD; [facility]  . TUBAL LIGATION      Family History  Problem Relation Age of Onset  . Diabetes Mother   . Diabetes Sister   . Diabetes Brother   . Lymphoma Brother   . Breast cancer Neg Hx   . Ovarian cancer Neg Hx   . Colon cancer Neg Hx     Social History   Socioeconomic History  . Marital status: Married    Spouse name: Not on file  . Number of children: Not on file  . Years of education: Not on file  . Highest education level: Not on file  Social Needs  . Financial resource strain: Not on file  . Food insecurity - worry: Not on file  . Food insecurity - inability: Not on file  . Transportation needs - medical: Not on file  . Transportation needs - non-medical: Not on file  Occupational History  . Not on file  Tobacco Use  . Smoking status: Current Every Day Smoker    Packs/day: 0.50    Years: 45.00    Pack years: 22.50    Types: Cigarettes  . Smokeless tobacco: Never Used  Substance and Sexual Activity  . Alcohol use: No    Alcohol/week: 0.0 oz  . Drug use: No  .  Sexual activity: Not Currently  Other Topics Concern  . Not on file  Social History Narrative  . Not on file     Current Outpatient Medications:  .  aspirin 81 MG tablet, Take 81 mg by mouth daily. , Disp: , Rfl:  .  atorvastatin (LIPITOR) 40 MG tablet, Take 40 mg by mouth at bedtime., Disp: , Rfl:  .  clonazePAM (KLONOPIN) 1 MG tablet, Take 1 tablet (1 mg total) by mouth 3 (three) times daily as needed for anxiety., Disp: 90 tablet, Rfl: 0 .  docusate sodium (COLACE) 100 MG capsule, Take 1 capsule (100 mg total) by mouth 2 (two) times daily., Disp: 10 capsule, Rfl: 0 .  Hydrocodone-Acetaminophen 7.5-300 MG TABS, Take 1 tablet by mouth every 8 (eight) hours as needed., Disp: 90 each, Rfl: 0 .  ibuprofen (ADVIL,MOTRIN) 800 MG tablet, Take 1 tablet (800 mg total) by mouth 3 (three) times daily., Disp: 50 tablet, Rfl: 1 .  isosorbide dinitrate (ISORDIL) 30 MG tablet, Take 30 mg daily by mouth., Disp: , Rfl:  .  omeprazole (PRILOSEC) 40 MG capsule, Take 1 capsule (40 mg total) by mouth daily., Disp:  90 capsule, Rfl: 1 .  polyethylene glycol (MIRALAX / GLYCOLAX) packet, Take 17 g by mouth daily as needed. , Disp: , Rfl:  .  senna (SENOKOT) 8.6 MG TABS tablet, Take 2 tablets by mouth daily as needed for mild constipation., Disp: , Rfl:  .  venlafaxine XR (EFFEXOR-XR) 150 MG 24 hr capsule, Take 1 capsule (150 mg total) by mouth at bedtime., Disp: 90 capsule, Rfl: 1 .  venlafaxine XR (EFFEXOR-XR) 75 MG 24 hr capsule, Take 1 capsule (75 mg total) by mouth at bedtime., Disp: 90 capsule, Rfl: 1  No Known Allergies   Review of Systems  Constitutional: Positive for irritability.  Psychiatric/Behavioral: Negative for suicidal ideas. The patient is nervous/anxious and has insomnia.     Objective  Vitals:   09/03/17 1337  BP: (!) 146/82  Pulse: 85  Resp: 16  Temp: 98.7 F (37.1 C)  TempSrc: Oral  SpO2: 98%  Weight: 165 lb (74.8 kg)    Physical Exam  Constitutional: She is oriented to  person, place, and time and well-developed, well-nourished, and in no distress.  HENT:  Head: Normocephalic and atraumatic.  Cardiovascular: Normal rate, regular rhythm and normal heart sounds.  No murmur heard. Pulmonary/Chest: Effort normal and breath sounds normal. She has no wheezes.  Neurological: She is alert and oriented to person, place, and time.  Psychiatric: Mood, memory, affect and judgment normal.  Nursing note and vitals reviewed.    Recent Results (from the past 2160 hour(s))  Basic metabolic panel     Status: Abnormal   Collection Time: 07/19/17  9:34 AM  Result Value Ref Range   Sodium 141 135 - 145 mmol/L   Potassium 4.0 3.5 - 5.1 mmol/L   Chloride 106 101 - 111 mmol/L   CO2 24 22 - 32 mmol/L   Glucose, Bld 105 (H) 65 - 99 mg/dL   BUN 18 6 - 20 mg/dL   Creatinine, Ser 0.90 0.44 - 1.00 mg/dL   Calcium 8.8 (L) 8.9 - 10.3 mg/dL   GFR calc non Af Amer >60 >60 mL/min   GFR calc Af Amer >60 >60 mL/min    Comment: (NOTE) The eGFR has been calculated using the CKD EPI equation. This calculation has not been validated in all clinical situations. eGFR's persistently <60 mL/min signify possible Chronic Kidney Disease.    Anion gap 11 5 - 15  CBC     Status: None   Collection Time: 07/19/17  9:34 AM  Result Value Ref Range   WBC 9.5 3.6 - 11.0 K/uL   RBC 4.84 3.80 - 5.20 MIL/uL   Hemoglobin 15.1 12.0 - 16.0 g/dL   HCT 43.2 35.0 - 47.0 %   MCV 89.3 80.0 - 100.0 fL   MCH 31.3 26.0 - 34.0 pg   MCHC 35.1 32.0 - 36.0 g/dL   RDW 14.2 11.5 - 14.5 %   Platelets 227 150 - 440 K/uL  Troponin I     Status: Abnormal   Collection Time: 07/19/17  9:34 AM  Result Value Ref Range   Troponin I 0.05 (HH) <0.03 ng/mL    Comment: CRITICAL RESULT CALLED TO, READ BACK BY AND VERIFIED WITH  HEATHER FISHER AT 1011 07/19/17 SDR   Troponin I     Status: Abnormal   Collection Time: 07/19/17 12:31 PM  Result Value Ref Range   Troponin I 0.07 (HH) <0.03 ng/mL    Comment: CRITICAL  VALUE NOTED. VALUE IS CONSISTENT WITH PREVIOUSLY REPORTED/CALLED VALUE  SDR  Assessment & Plan  1. Generalized anxiety disorder Refill for Clonazepma provided, apprised of my departure from practice and the need to see thea new PCP or psychiatry specialist for continued management of anxiety. - clonazePAM (KLONOPIN) 1 MG tablet; Take 1 tablet (1 mg total) 3 (three) times daily as needed by mouth for anxiety.  Dispense: 90 tablet; Refill: 0  2. Elevated blood pressure reading Likely because of anxiety vs cardiac related (pt. Is being seen by cardiology Dr. Humphrey Rolls for her heart, records not available).    Natilie Krabbenhoft [facility] 09/03/2017 1:53 PM

## 2017-09-11 ENCOUNTER — Other Ambulatory Visit: Payer: Self-pay | Admitting: Family Medicine

## 2017-09-11 DIAGNOSIS — M545 Low back pain, unspecified: Secondary | ICD-10-CM

## 2017-09-11 DIAGNOSIS — G8929 Other chronic pain: Secondary | ICD-10-CM

## 2017-09-11 MED ORDER — HYDROCODONE-ACETAMINOPHEN 7.5-300 MG PO TABS: 1.0000 | ORAL_TABLET | Freq: Three times a day (TID) | ORAL | 0 refills | Status: DC | PRN

## 2017-09-11 NOTE — Telephone Encounter (Signed)
Pt would like to receive a phone call to let her know when she can pick up her refill

## 2017-09-11 NOTE — Telephone Encounter (Signed)
Rx request 

## 2017-09-11 NOTE — Telephone Encounter (Signed)
Apt schedule for pain clinic on 09/17/2017

## 2017-09-11 NOTE — Telephone Encounter (Signed)
Copied from [place]. Topic: General - Other >> Sep 11, 2017  9:07 AM Oneta Rack wrote: Relation to DI:XVEZ  Call back number:[phone] Pharmacy: [place], [place] - [place] [phone] (Phone) [phone] (Fax)   Reason for call: Spouse checking on the status of Hydrocodone-Acetaminophen 7.5-300 MG TABS refill request, spouse would like to pick up rx today, please advise

## 2017-09-16 ENCOUNTER — Ambulatory Visit: Payer: Medicare Other | Admitting: Nurse Practitioner

## 2017-09-16 NOTE — Telephone Encounter (Signed)
RX signed on 09/11/2017 and given to [facility] to put up front

## 2017-09-17 ENCOUNTER — Ambulatory Visit: Payer: Medicare Other | Admitting: Student in an Organized Health Care Education/Training Program

## 2017-09-30 ENCOUNTER — Ambulatory Visit: Payer: Medicare Other | Admitting: Nurse Practitioner

## 2017-10-01 ENCOUNTER — Ambulatory Visit: Payer: Medicare Other | Admitting: Student in an Organized Health Care Education/Training Program

## 2017-10-02 ENCOUNTER — Ambulatory Visit: Payer: Medicare Other | Admitting: Nurse Practitioner

## 2017-10-07 ENCOUNTER — Other Ambulatory Visit: Payer: Self-pay

## 2017-10-07 ENCOUNTER — Ambulatory Visit (INDEPENDENT_AMBULATORY_CARE_PROVIDER_SITE_OTHER): Payer: Medicare Other | Admitting: Nurse Practitioner

## 2017-10-07 ENCOUNTER — Encounter: Payer: Self-pay | Admitting: Nurse Practitioner

## 2017-10-07 VITALS — BP 123/73 | HR 88 | Temp 98.1°F | Ht 62.0 in | Wt 162.2 lb

## 2017-10-07 DIAGNOSIS — M5136 Other intervertebral disc degeneration, lumbar region: Secondary | ICD-10-CM | POA: Diagnosis not present

## 2017-10-07 DIAGNOSIS — M545 Low back pain: Secondary | ICD-10-CM | POA: Diagnosis not present

## 2017-10-07 DIAGNOSIS — F410 Panic disorder [episodic paroxysmal anxiety] without agoraphobia: Secondary | ICD-10-CM | POA: Diagnosis not present

## 2017-10-07 DIAGNOSIS — M51369 Other intervertebral disc degeneration, lumbar region without mention of lumbar back pain or lower extremity pain: Secondary | ICD-10-CM

## 2017-10-07 DIAGNOSIS — Z1239 Encounter for other screening for malignant neoplasm of breast: Secondary | ICD-10-CM

## 2017-10-07 DIAGNOSIS — F411 Generalized anxiety disorder: Secondary | ICD-10-CM

## 2017-10-07 DIAGNOSIS — Z7689 Persons encountering health services in other specified circumstances: Secondary | ICD-10-CM

## 2017-10-07 DIAGNOSIS — G8929 Other chronic pain: Secondary | ICD-10-CM

## 2017-10-07 MED ORDER — CLONAZEPAM 1 MG PO TABS: 1.0000 mg | ORAL_TABLET | Freq: Three times a day (TID) | ORAL | 0 refills | Status: DC | PRN

## 2017-10-07 NOTE — Patient Instructions (Addendum)
Cyndal, Thank you for coming in to clinic today.  1. For your clonazepam: - Try reducing your daytime dose to 1/2 tablet as needed. - Try reducing your nighttime dose to 1 and 1/2 tablet.  If it still works at 1/1/2 tablets, continue at that dose for 2-3 weeks.  Then, try 1 tablet only at bedtime.  2. Continue your other medications without changes.   Please schedule a follow-up appointment with Cassell Smiles, AGNP. Return in about 4 weeks (around 11/04/2017) for anxiety.  If you have any other questions or concerns, please feel free to call the clinic or send a message through [place]. You may also schedule an earlier appointment if necessary.  You will receive a survey after today's visit either digitally by e-mail or paper by [employer]. Your experiences and feedback matter to [place].  Please respond so we know how we are doing as we provide care for you.   Cassell Smiles, DNP, AGNP-BC Adult Gerontology Nurse Practitioner [place]

## 2017-10-07 NOTE — Progress Notes (Signed)
Subjective:    Patient ID: Kim Buchanan, female    DOB: 1950-11-12, 66 y.o.   MRN: [id number]  Kim Buchanan is a 66 y.o. female presenting on 10/07/2017 for Establish Care (transition from Dr. Manuella Ghazi, anxiety. )   HPI Establish Care New Provider Pt last seen by PCP 2 months ago.  Obtain records from [facility] for Dr. Manuella Ghazi.   Chronic Low Back Pain - Pt has been on chronic opioids for pain, but has been steadily decreasing her dose and has not taken any for about 4 weeks.  She currently declines pain management, but still has daily back pain.  This pain causes her pain with iADLs, but she is able to perform them with frequent breaks. She reports a current level of pain in moderately high range, but visibly is not in significant distress.  She has not been on any medications to control pain other than oxycodone for pain in recent years.  Anxiety/PTSD Pt has anxiety and PTSD for which she still has flashbacks and panic attacks, but better than in past.  Pt states she is happy with her current regimen.  - Has panic attacks and takes clonazepam 1 mg tid prn for anxiety and panic disorder. Uses 2 tabs at night for bed and only 1 tab approx 3 times per week otherwise because panic attack frequency is lower than prescribed med.   Optometry: Last eye exam about 3-4 years ago w/ Dr. Ellin Mayhew. Dental: none  Past Medical History:  Diagnosis Date  . Anxiety   . Arthritis    lower back  . Depression   . GERD (gastroesophageal reflux disease)   . Hyperlipidemia   . Leaky heart valve    sees Dr Humphrey Rolls  . Panic attack   . PTSD (post-traumatic stress disorder)   . Rectal fistula   . Sleep apnea   . Wears dentures    full upper   Past Surgical History:  Procedure Laterality Date  . ABDOMINAL HYSTERECTOMY  10/2015  . COLONOSCOPY  2014  . COLONOSCOPY WITH PROPOFOL N/A 10/01/2016   Procedure: COLONOSCOPY WITH PROPOFOL;  Surgeon: Lucilla Lame, MD;  Location: [facility];  Service:  Endoscopy;  Laterality: N/A;  . CYSTOCELE REPAIR N/A 12/17/2016   Procedure: ANTERIOR REPAIR (CYSTOCELE);  Surgeon: Brayton Mars, MD;  Location: [facility];  Service: Gynecology;  Laterality: N/A;  . POLYPECTOMY  10/01/2016   Procedure: POLYPECTOMY;  Surgeon: Lucilla Lame, MD;  Location: [facility];  Service: Endoscopy;;  . RECTAL SURGERY  06/28/2015   Rectal prolapse, laparoscopic rectopexy the coldCharmont, MD; [facility]  . TUBAL LIGATION    . VAGINAL HYSTERECTOMY Bilateral 12/17/2016   Procedure: HYSTERECTOMY VAGINAL WITH BILATERAL SALPINGO OOPHERECTOMY;  Surgeon: Brayton Mars, MD;  Location: [facility];  Service: Gynecology;  Laterality: Bilateral;   Social History   Socioeconomic History  . Marital status: Married    Spouse name: Not on file  . Number of children: Not on file  . Years of education: Not on file  . Highest education level: Not on file  Social Needs  . Financial resource strain: Not on file  . Food insecurity - worry: Not on file  . Food insecurity - inability: Not on file  . Transportation needs - medical: Not on file  . Transportation needs - non-medical: Not on file  Occupational History  . Not on file  Tobacco Use  . Smoking status: Current Every Day Smoker    Packs/day: 0.50  Years: 45.00    Pack years: 22.50    Types: Cigarettes  . Smokeless tobacco: Never Used  . Tobacco comment: started at age 9  Substance and Sexual Activity  . Alcohol use: No    Alcohol/week: 0.0 oz  . Drug use: No  . Sexual activity: Not Currently  Other Topics Concern  . Not on file  Social History Narrative  . Not on file   Family History  Problem Relation Age of Onset  . Diabetes Mother   . Diabetes Sister   . Diabetes Brother   . Lymphoma Brother   . Heart disease Father   . Diabetes Sister   . Diabetes Brother   . Lymphoma Brother   . Heart disease Brother   . Healthy Sister   . Breast cancer Neg Hx   . Ovarian cancer Neg Hx   . Colon cancer  Neg Hx    Current Outpatient Medications on File Prior to Visit  Medication Sig  . aspirin 81 MG tablet Take 81 mg by mouth daily.   Marland Kitchen atorvastatin (LIPITOR) 40 MG tablet Take 40 mg by mouth at bedtime.  Marland Kitchen ibuprofen (ADVIL,MOTRIN) 800 MG tablet Take 1 tablet (800 mg total) by mouth 3 (three) times daily.  . isosorbide dinitrate (ISORDIL) 30 MG tablet Take 30 mg daily by mouth.  Marland Kitchen omeprazole (PRILOSEC) 40 MG capsule Take 1 capsule (40 mg total) by mouth daily.  . polyethylene glycol (MIRALAX / GLYCOLAX) packet Take 17 g by mouth daily as needed.   . senna (SENOKOT) 8.6 MG TABS tablet Take 2 tablets by mouth daily as needed for mild constipation.  Marland Kitchen venlafaxine XR (EFFEXOR-XR) 150 MG 24 hr capsule Take 1 capsule (150 mg total) by mouth at bedtime.  Marland Kitchen venlafaxine XR (EFFEXOR-XR) 75 MG 24 hr capsule Take 1 capsule (75 mg total) by mouth at bedtime.  . docusate sodium (COLACE) 100 MG capsule Take 1 capsule (100 mg total) by mouth 2 (two) times daily. (Patient not taking: Reported on 10/07/2017)   No current facility-administered medications on file prior to visit.     Review of Systems Per HPI unless specifically indicated above.     Objective:    BP 123/73 (BP Location: Right Arm, Patient Position: Sitting, Cuff Size: Normal)   Pulse 88   Temp 98.1 F (36.7 C) (Oral)   Ht 5\' 2"  (1.575 m)   Wt 162 lb 3.2 oz (73.6 kg)   BMI 29.67 kg/m   Wt Readings from Last 3 Encounters:  10/07/17 162 lb 3.2 oz (73.6 kg)  09/03/17 165 lb (74.8 kg)  08/15/17 165 lb 9.6 oz (75.1 kg)    Physical Exam  General - overweight, well-appearing, NAD HEENT - Normocephalic, atraumatic Neck - supple, non-tender, no LAD, no thyromegaly, no carotid bruit Heart - RRR, no murmurs heard Lungs - Clear throughout all lobes, no wheezing, crackles, or rhonchi. Normal work of breathing. Extremeties - non-tender, no edema, cap refill < 2 seconds, peripheral pulses intact +2 bilaterally Musculoskeletal - Low  Back Inspection: Normal appearance, no spinal deformity, symmetrical. Palpation: No tenderness over spinous processes. Bilateral lumbar paraspinal muscles are tender to light palpation and with hypertonicity/spasm. ROM: Limited active ROM forward flex / back extension, with discomfort - rotation L/R without discomfort Special Testing: Seated SLR with reproduced bilateral back pain but negative for radicular pain.  Strength: Bilateral hip flex/ext 5/5, knee flex/ext 5/5, ankle dorsiflex/plantarflex 5/5 Neurovascular: intact distal sensation to light touch Skin - warm, dry Neuro -  awake, alert, oriented x3, normal gait Psych - Normal mood and affect, normal behavior   Results for orders placed or performed during the hospital encounter of 41/93/79  Basic metabolic panel  Result Value Ref Range   Sodium 141 135 - 145 mmol/L   Potassium 4.0 3.5 - 5.1 mmol/L   Chloride 106 101 - 111 mmol/L   CO2 24 22 - 32 mmol/L   Glucose, Bld 105 (H) 65 - 99 mg/dL   BUN 18 6 - 20 mg/dL   Creatinine, Ser 0.90 0.44 - 1.00 mg/dL   Calcium 8.8 (L) 8.9 - 10.3 mg/dL   GFR calc non Af Amer >60 >60 mL/min   GFR calc Af Amer >60 >60 mL/min   Anion gap 11 5 - 15  CBC  Result Value Ref Range   WBC 9.5 3.6 - 11.0 K/uL   RBC 4.84 3.80 - 5.20 MIL/uL   Hemoglobin 15.1 12.0 - 16.0 g/dL   HCT 43.2 35.0 - 47.0 %   MCV 89.3 80.0 - 100.0 fL   MCH 31.3 26.0 - 34.0 pg   MCHC 35.1 32.0 - 36.0 g/dL   RDW 14.2 11.5 - 14.5 %   Platelets 227 150 - 440 K/uL  Troponin I  Result Value Ref Range   Troponin I 0.05 (HH) <0.03 ng/mL  Troponin I  Result Value Ref Range   Troponin I 0.07 (HH) <0.03 ng/mL      Assessment & Plan:   Problem List Items Addressed This Visit      Musculoskeletal and Integument   DDD (degenerative disc disease), lumbar    Pt has long history of chronic back pain and prior interventional pain management treatment.  Pt declines neurosurgery options as well.  Has previously been managed only w/  opioid pain medication, but has now been off for several weeks.  Pt w/ hypertonicity of paraspinal muscles.  Plan: 1. Discussed no chronic opioids from this clinic.  Pt verbalizes understanding. 2. START baclofen 10 mg tid prn muscle spasm.   - Encouraged daily stretching as tolerated and consideration of PT. 3. Followup 4 weeks.        Other   Panic attack - Primary    Pt with moderately frequent panic attacks about 3 x per week.  Patient is currently on high-dose clonazepam with total of 3 mg/day.  Prefer to initiate additional anxiety management, possibly with BuSpar.    Plan: 1.  Continue Effexor 225 mg at bedtime. 2.  Reduce clonazepam at bedtime to 1.5 tabs for total 1.5 mg dose.   - After 2 weeks, consider 1 tablet at bedtime for 1 mg dose. - For panic attack during the day continue with 1 mg with 1 mg as needed.  Consider half tab for 0.5 mg as needed for panic attack. -Discussed with patient need to use lowest dose possible to achieve effect.  Max recommended daily dose 2 mg/day.  Goal for future prescription at next is 0.5 mg twice daily as needed for panic attack and 1 mg at bedtime for anxiety and insomnia.  Pt verbalizes understanding. 3 Return in about 4 weeks (around 11/04/2017) for anxiety.       Low back pain    See AP DDD.      Generalized anxiety disorder    See AP panic attack.      Relevant Medications   clonazePAM (KLONOPIN) 1 MG tablet    Other Visit Diagnoses    Breast cancer screening  Pt due for mammogram.  Order placed, instructions given to call and schedule.   Relevant Orders   MM DIGITAL SCREENING BILATERAL   Encounter to establish care       Pt last seen by Dr Manuella Ghazi at [facility].  records reviewed.  History reviewed with pt today.      Meds ordered this encounter  Medications  . clonazePAM (KLONOPIN) 1 MG tablet    Sig: Take 1 tablet (1 mg total) by mouth 3 (three) times daily as needed for anxiety.    Dispense:  90  tablet    Refill:  0    Order Specific Question:   Supervising Provider    Answer:   Olin Hauser [[id number]]      Follow up plan: Return in about 4 weeks (around 11/04/2017) for anxiety.  Cassell Smiles, DNP, AGPCNP-BC Adult Gerontology Primary Care Nurse Practitioner [facility] 10/20/2017, 10:57 AM

## 2017-10-08 ENCOUNTER — Telehealth: Payer: Self-pay | Admitting: Nurse Practitioner

## 2017-10-08 MED ORDER — BACLOFEN 10 MG PO TABS: 10.0000 mg | ORAL_TABLET | Freq: Three times a day (TID) | ORAL | 0 refills | Status: DC

## 2017-10-08 NOTE — Telephone Encounter (Signed)
The pt was notified, no questions or concern.

## 2017-10-08 NOTE — Telephone Encounter (Signed)
Sent baclofen 10 mg tid prn to pharmacy for muscle spasm.

## 2017-10-08 NOTE — Telephone Encounter (Signed)
Pt is having muscle spasms in low back and asked to have something called in at [facility].  Her call back number [phone]

## 2017-10-16 ENCOUNTER — Other Ambulatory Visit: Payer: Self-pay | Admitting: Nurse Practitioner

## 2017-10-16 ENCOUNTER — Telehealth: Payer: Self-pay | Admitting: Nurse Practitioner

## 2017-10-16 DIAGNOSIS — M545 Low back pain: Principal | ICD-10-CM

## 2017-10-16 DIAGNOSIS — G8929 Other chronic pain: Secondary | ICD-10-CM

## 2017-10-16 MED ORDER — BACLOFEN 10 MG PO TABS: 10.0000 mg | ORAL_TABLET | Freq: Three times a day (TID) | ORAL | 1 refills | Status: DC

## 2017-10-16 NOTE — Telephone Encounter (Signed)
Pt asked for refill on muscle relaxers.  She uses [facility].  Her call back number is [phone]

## 2017-10-20 ENCOUNTER — Encounter: Payer: Self-pay | Admitting: Nurse Practitioner

## 2017-10-20 NOTE — Assessment & Plan Note (Signed)
Pt with moderately frequent panic attacks about 3 x per week.  Patient is currently on high-dose clonazepam with total of 3 mg/day.  Prefer to initiate additional anxiety management, possibly with BuSpar.    Plan: 1.  Continue Effexor 225 mg at bedtime. 2.  Reduce clonazepam at bedtime to 1.5 tabs for total 1.5 mg dose.   - After 2 weeks, consider 1 tablet at bedtime for 1 mg dose. - For panic attack during the day continue with 1 mg with 1 mg as needed.  Consider half tab for 0.5 mg as needed for panic attack. -Discussed with patient need to use lowest dose possible to achieve effect.  Max recommended daily dose 2 mg/day.  Goal for future prescription at next is 0.5 mg twice daily as needed for panic attack and 1 mg at bedtime for anxiety and insomnia.  Pt verbalizes understanding. 3 Return in about 4 weeks (around 11/04/2017) for anxiety.

## 2017-10-20 NOTE — Assessment & Plan Note (Signed)
Pt has long history of chronic back pain and prior interventional pain management treatment.  Pt declines neurosurgery options as well.  Has previously been managed only w/ opioid pain medication, but has now been off for several weeks.  Pt w/ hypertonicity of paraspinal muscles.  Plan: 1. Discussed no chronic opioids from this clinic.  Pt verbalizes understanding. 2. START baclofen 10 mg tid prn muscle spasm.   - Encouraged daily stretching as tolerated and consideration of PT. 3. Followup 4 weeks.

## 2017-10-20 NOTE — Assessment & Plan Note (Signed)
See AP DDD.

## 2017-10-20 NOTE — Assessment & Plan Note (Signed)
See AP panic attack.

## 2017-11-01 NOTE — Progress Notes (Deleted)
ANNUAL PREVENTATIVE CARE GYN  ENCOUNTER NOTE  Subjective:       Kim Buchanan is a 67 y.o. G33P1001 female here for a routine annual gynecologic exam.  Current complaints: 1.      Gynecologic History No LMP recorded. Patient is postmenopausal. Contraception: S/p tvh/bso Last Pap: 10/31/2016 neg/neg Results were: normal Last mammogram: 04/2014 birad 2 . Results were: normal  Obstetric History OB History  Gravida Para Term Preterm AB Living  1 1 1     1   SAB TAB Ectopic Multiple Live Births          1    # Outcome Date GA Lbr Len/2nd Weight Sex Delivery Anes PTL Lv  1 Term 1971   8 lb (3.629 kg) M Vag-Spont   LIV    Obstetric Comments  Son Passed at age 38 due to MVA    Past Medical History:  Diagnosis Date  . Anxiety   . Arthritis    lower back  . Depression   . GERD (gastroesophageal reflux disease)   . Hyperlipidemia   . Leaky heart valve    sees Dr Humphrey Rolls  . Panic attack   . PTSD (post-traumatic stress disorder)   . Rectal fistula   . Sleep apnea   . Wears dentures    full upper    Past Surgical History:  Procedure Laterality Date  . ABDOMINAL HYSTERECTOMY  10/2015  . COLONOSCOPY  2014  . COLONOSCOPY WITH PROPOFOL N/A 10/01/2016   Procedure: COLONOSCOPY WITH PROPOFOL;  Surgeon: Lucilla Lame, MD;  Location: [place];  Service: Endoscopy;  Laterality: N/A;  . CYSTOCELE REPAIR N/A 12/17/2016   Procedure: ANTERIOR REPAIR (CYSTOCELE);  Surgeon: Brayton Mars, MD;  Location: [facility];  Service: Gynecology;  Laterality: N/A;  . POLYPECTOMY  10/01/2016   Procedure: POLYPECTOMY;  Surgeon: Lucilla Lame, MD;  Location: [place];  Service: Endoscopy;;  . RECTAL SURGERY  06/28/2015   Rectal prolapse, laparoscopic rectopexy the coldCharmont, MD; [facility]  . TUBAL LIGATION    . VAGINAL HYSTERECTOMY Bilateral 12/17/2016   Procedure: HYSTERECTOMY VAGINAL WITH BILATERAL SALPINGO OOPHERECTOMY;  Surgeon: Brayton Mars, MD;  Location: [facility];  Service:  Gynecology;  Laterality: Bilateral;    Current Outpatient Medications on File Prior to Visit  Medication Sig Dispense Refill  . aspirin 81 MG tablet Take 81 mg by mouth daily.     Marland Kitchen atorvastatin (LIPITOR) 40 MG tablet Take 40 mg by mouth at bedtime.    . baclofen (LIORESAL) 10 MG tablet Take 1 tablet (10 mg total) by mouth 3 (three) times daily. 90 each 1  . clonazePAM (KLONOPIN) 1 MG tablet Take 1 tablet (1 mg total) by mouth 3 (three) times daily as needed for anxiety. 90 tablet 0  . docusate sodium (COLACE) 100 MG capsule Take 1 capsule (100 mg total) by mouth 2 (two) times daily. (Patient not taking: Reported on 10/07/2017) 10 capsule 0  . ibuprofen (ADVIL,MOTRIN) 800 MG tablet Take 1 tablet (800 mg total) by mouth 3 (three) times daily. 50 tablet 1  . isosorbide dinitrate (ISORDIL) 30 MG tablet Take 30 mg daily by mouth.    Marland Kitchen omeprazole (PRILOSEC) 40 MG capsule Take 1 capsule (40 mg total) by mouth daily. 90 capsule 1  . polyethylene glycol (MIRALAX / GLYCOLAX) packet Take 17 g by mouth daily as needed.     . senna (SENOKOT) 8.6 MG TABS tablet Take 2 tablets by mouth daily as needed for mild constipation.    Marland Kitchen  venlafaxine XR (EFFEXOR-XR) 150 MG 24 hr capsule Take 1 capsule (150 mg total) by mouth at bedtime. 90 capsule 1  . venlafaxine XR (EFFEXOR-XR) 75 MG 24 hr capsule Take 1 capsule (75 mg total) by mouth at bedtime. 90 capsule 1   No current facility-administered medications on file prior to visit.     No Known Allergies  Social History   Socioeconomic History  . Marital status: Married    Spouse name: Not on file  . Number of children: Not on file  . Years of education: Not on file  . Highest education level: Not on file  Social Needs  . Financial resource strain: Not on file  . Food insecurity - worry: Not on file  . Food insecurity - inability: Not on file  . Transportation needs - medical: Not on file  . Transportation needs - non-medical: Not on file  Occupational  History  . Not on file  Tobacco Use  . Smoking status: Current Every Day Smoker    Packs/day: 0.50    Years: 45.00    Pack years: 22.50    Types: Cigarettes  . Smokeless tobacco: Never Used  . Tobacco comment: started at age 31  Substance and Sexual Activity  . Alcohol use: No    Alcohol/week: 0.0 oz  . Drug use: No  . Sexual activity: Not Currently  Other Topics Concern  . Not on file  Social History Narrative  . Not on file    Family History  Problem Relation Age of Onset  . Diabetes Mother   . Diabetes Sister   . Diabetes Brother   . Lymphoma Brother   . Heart disease Father   . Diabetes Sister   . Diabetes Brother   . Lymphoma Brother   . Heart disease Brother   . Healthy Sister   . Breast cancer Neg Hx   . Ovarian cancer Neg Hx   . Colon cancer Neg Hx     The following portions of the patient's history were reviewed and updated as appropriate: allergies, current medications, past family history, past medical history, past social history, past surgical history and problem list.  Review of Systems ROS   Objective:   There were no vitals taken for this visit.  CONSTITUTIONAL: Well-developed, well-nourished female in no acute distress.  PSYCHIATRIC: Normal mood and affect. Normal behavior. Normal judgment and thought content. [place]: Alert and oriented to person, place, and time. Normal muscle tone coordination. No cranial nerve deficit noted. HENT:  Normocephalic, atraumatic, External right and left ear normal. Oropharynx is clear and moist EYES: Conjunctivae and EOM are normal. Pupils are equal, round, and reactive to light. No scleral icterus.  NECK: Normal range of motion, supple, no masses.  Normal thyroid.  SKIN: Skin is warm and dry. No rash noted. Not diaphoretic. No erythema. No pallor. CARDIOVASCULAR: Normal heart rate noted, regular rhythm, no murmur. RESPIRATORY: Clear to auscultation bilaterally. Effort and breath sounds normal, no problems with  respiration noted. BREASTS: Symmetric in size. No masses, skin changes, nipple drainage, or lymphadenopathy. ABDOMEN: Soft, normal bowel sounds, no distention noted.  No tenderness, rebound or guarding.  BLADDER: Normal PELVIC:  External Genitalia: Normal  BUS: Normal  Vagina: Mild atrophic changes  Cervix: Normal; parous; prolapsed to the introitus  Uterus: Normal; midplane, mobile, nontender, normal size and shape  Adnexa: Normal; nonpalpable and nontender  RV: External Exam NormaI, No Rectal Masses and Normal Sphincter tone  MUSCULOSKELETAL: Normal range of motion. No tenderness.  No cyanosis, clubbing, or edema.  2+ distal pulses. LYMPHATIC: No Axillary, Supraclavicular, or Inguinal Adenopathy.    Assessment:   Annual gynecologic examination 67 y.o. Contraception: post menopausal status bmi-30 Vaginal atrophy    Plan:  Pap: not needed Mammogram: Ordered Stool Guaiac Testing: ordered  Colonoscopy 09/2016- polyps- repeat in 5 years Labs: thru pcp Routine preventative health maintenance measures emphasized: Exercise/Diet/Weight control, Tobacco Warnings and Alcohol/Substance use risks  Estrace cream intravaginal 1/2 g nightly for 30 days Return to [place]   Note: This dictation was prepared with Dragon dictation along with smaller phrase technology. Any transcriptional errors that result from this process are unintentional.

## 2017-11-05 ENCOUNTER — Encounter: Payer: Medicare Other | Admitting: Obstetrics and Gynecology

## 2017-11-05 ENCOUNTER — Ambulatory Visit: Payer: Medicare Other | Admitting: Student in an Organized Health Care Education/Training Program

## 2017-11-06 ENCOUNTER — Other Ambulatory Visit: Payer: Self-pay

## 2017-11-06 ENCOUNTER — Encounter: Payer: Self-pay | Admitting: Nurse Practitioner

## 2017-11-06 ENCOUNTER — Ambulatory Visit (INDEPENDENT_AMBULATORY_CARE_PROVIDER_SITE_OTHER): Payer: Medicare Other | Admitting: Nurse Practitioner

## 2017-11-06 VITALS — BP 133/69 | HR 94 | Temp 98.2°F | Resp 18 | Ht 62.0 in | Wt 160.4 lb

## 2017-11-06 DIAGNOSIS — M545 Low back pain, unspecified: Secondary | ICD-10-CM

## 2017-11-06 DIAGNOSIS — K219 Gastro-esophageal reflux disease without esophagitis: Secondary | ICD-10-CM | POA: Diagnosis not present

## 2017-11-06 DIAGNOSIS — G8929 Other chronic pain: Secondary | ICD-10-CM | POA: Diagnosis not present

## 2017-11-06 DIAGNOSIS — F419 Anxiety disorder, unspecified: Secondary | ICD-10-CM | POA: Diagnosis not present

## 2017-11-06 DIAGNOSIS — F3341 Major depressive disorder, recurrent, in partial remission: Secondary | ICD-10-CM

## 2017-11-06 DIAGNOSIS — E782 Mixed hyperlipidemia: Secondary | ICD-10-CM

## 2017-11-06 DIAGNOSIS — I25118 Atherosclerotic heart disease of native coronary artery with other forms of angina pectoris: Secondary | ICD-10-CM

## 2017-11-06 DIAGNOSIS — F5104 Psychophysiologic insomnia: Secondary | ICD-10-CM

## 2017-11-06 DIAGNOSIS — F329 Major depressive disorder, single episode, unspecified: Secondary | ICD-10-CM | POA: Diagnosis not present

## 2017-11-06 DIAGNOSIS — G4700 Insomnia, unspecified: Secondary | ICD-10-CM | POA: Insufficient documentation

## 2017-11-06 DIAGNOSIS — F32A Depression, unspecified: Secondary | ICD-10-CM

## 2017-11-06 MED ORDER — ISOSORBIDE DINITRATE 30 MG PO TABS: 30.0000 mg | ORAL_TABLET | Freq: Every day | ORAL | 0 refills | Status: DC

## 2017-11-06 MED ORDER — BACLOFEN 10 MG PO TABS: 10.0000 mg | ORAL_TABLET | Freq: Four times a day (QID) | ORAL | 0 refills | Status: DC

## 2017-11-06 MED ORDER — AMITRIPTYLINE HCL 10 MG PO TABS: 10.0000 mg | ORAL_TABLET | Freq: Every day | ORAL | 0 refills | Status: DC

## 2017-11-06 MED ORDER — OMEPRAZOLE 40 MG PO CPDR: 40.0000 mg | DELAYED_RELEASE_CAPSULE | Freq: Every day | ORAL | 1 refills | Status: DC

## 2017-11-06 MED ORDER — VENLAFAXINE HCL ER 75 MG PO CP24: 75.0000 mg | ORAL_CAPSULE | Freq: Every day | ORAL | 1 refills | Status: DC

## 2017-11-06 MED ORDER — CLONAZEPAM 0.5 MG PO TABS: ORAL_TABLET | ORAL | 0 refills | Status: DC

## 2017-11-06 MED ORDER — ATORVASTATIN CALCIUM 20 MG PO TABS: 20.0000 mg | ORAL_TABLET | Freq: Every day | ORAL | 5 refills | Status: DC

## 2017-11-06 MED ORDER — VENLAFAXINE HCL ER 150 MG PO CP24: 150.0000 mg | ORAL_CAPSULE | Freq: Every day | ORAL | 1 refills | Status: DC

## 2017-11-06 NOTE — Progress Notes (Signed)
Subjective:    Patient ID: Kim Buchanan, female    DOB: 1951/05/02, 67 y.o.   MRN: [id number]  Kim Buchanan is a 67 y.o. female presenting on 11/06/2017 for Anxiety   HPI DDD & Chronic Low Back Pain Started baclofen 10 mg tid at last visit.  Pt states 10 mg was not strong enough, so she took 20 mg twice daily without contacting clinic for instructions.  Pt notes the med has helped her pain "a little bit," but still had spasms when standing at sink for a long period of time.  After additional questions, pt admits it has helped more than initially admitted.  Pt is able to perform more ADLs since starting the baclofen.  She does note twice daily dosing is not sufficient for completely managing her symptoms.  It is also helping relieve some of her pain from central lumbar spine as well as the spasms she has of paraspinal muscles. - Pt has appointment with [facility] upcoming for evaluation and treatment.  Pt states she does not need a new referral.  Anxiety and Insomnia Pt continues to have severe anxiety with panic disorder.  She was previously taking 1 mg daytime for panic attack as needed and didn't need to have it daily. She continues to report she only needs her daytime clonazepam 2-4 times per week depending on stresses. - She was also taking 2 mg clonazepam at night to help with sleep.  Since last visit pt did try to reduce clonazepam to 1.5 mg at bedtime and tried 1.5 mg for 4 nights, then resumed 2 mg once at night because it "didn't help her sleep."  - Pt also reports trying only 0.5 mg during day for panic attack, but it did not help panic.  She reports her panic attack did eventually resolve, but she is now back to full 1 mg tablet during day.   - Pt states she has never used tramadol or seroquel for sleep.  Also has not used amitriptyline in past for pain or sleep.  GAD 7 : Generalized Anxiety Score 11/06/2017 10/07/2017  Nervous, Anxious, on Edge 1 1  Control/stop worrying 3 1  Worry  too much - different things 3 1  Trouble relaxing 3 0  Restless 3 0  Easily annoyed or irritable 3 1  Afraid - awful might happen 2 1  Total GAD 7 Score 18 5  Anxiety Difficulty Somewhat difficult Somewhat difficult    Depression screen [facility] 2/9 11/06/2017 10/07/2017 09/03/2017 08/15/2017 06/20/2017  Decreased Interest 1 3 0 0 0  Down, Depressed, Hopeless 1 3 0 0 0  PHQ - 2 Score 2 6 0 0 0  Altered sleeping 2 3 - - -  Tired, decreased energy 3 3 - - -  Change in appetite 3 3 - - -  Feeling bad or failure about yourself  0 0 - - -  Trouble concentrating 0 0 - - -  Moving slowly or fidgety/restless 3 3 - - -  Suicidal thoughts 0 0 - - -  PHQ-9 Score 13 18 - - -  Difficult doing work/chores Somewhat difficult Somewhat difficult - - -    Social History   Tobacco Use  . Smoking status: Current Every Day Smoker    Packs/day: 0.50    Years: 45.00    Pack years: 22.50    Types: Cigarettes  . Smokeless tobacco: Never Used  . Tobacco comment: started at age 65  Substance Use Topics  .  Alcohol use: No    Alcohol/week: 0.0 oz  . Drug use: No    Review of Systems Per HPI unless specifically indicated above     Objective:    BP 133/69 (BP Location: Right Arm, Patient Position: Sitting, Cuff Size: Normal)   Pulse 94   Temp 98.2 F (36.8 C) (Oral)   Resp 18   Ht 5\' 2"  (1.575 m)   Wt 160 lb 6.4 oz (72.8 kg)   SpO2 96%   BMI 29.34 kg/m   Wt Readings from Last 3 Encounters:  11/14/17 162 lb 7.7 oz (73.7 kg)  11/06/17 160 lb 6.4 oz (72.8 kg)  10/07/17 162 lb 3.2 oz (73.6 kg)    Physical Exam  General - overweight, well-appearing, NAD HEENT - Normocephalic, atraumatic Neck - supple, non-tender, no LAD, no thyromegaly, no carotid bruit Heart - RRR, no murmurs heard Lungs - Clear throughout all lobes, no wheezing, crackles, or rhonchi. Normal work of breathing. Extremeties - non-tender, no edema, cap refill < 2 seconds, peripheral pulses intact +2 bilaterally Musculoskeletal  - Low Back Inspection: Normal appearance, no spinal deformity, symmetrical. Palpation: No tenderness over spinous processes. Bilateral lumbar paraspinal muscles are tender to light palpation and with hypertonicity/spasm. ROM: Limited active ROM forward flex / back extension, with discomfort - rotation L/R without discomfort Special Testing: Seated SLR with reproduced bilateral back pain but negative for radicular pain.  Strength: Bilateral hip flex/ext 5/5, knee flex/ext 5/5, ankle dorsiflex/plantarflex 5/5 Neurovascular: intact distal sensation to light touch Skin - warm, dry Neuro - awake, alert, oriented x3, normal gait Psych - Depressed mood and affect, withdrawn behavior   Results for orders placed or performed during the hospital encounter of 78/93/81  Basic metabolic panel  Result Value Ref Range   Sodium 141 135 - 145 mmol/L   Potassium 4.0 3.5 - 5.1 mmol/L   Chloride 106 101 - 111 mmol/L   CO2 24 22 - 32 mmol/L   Glucose, Bld 105 (H) 65 - 99 mg/dL   BUN 18 6 - 20 mg/dL   Creatinine, Ser 0.90 0.44 - 1.00 mg/dL   Calcium 8.8 (L) 8.9 - 10.3 mg/dL   GFR calc non Af Amer >60 >60 mL/min   GFR calc Af Amer >60 >60 mL/min   Anion gap 11 5 - 15  CBC  Result Value Ref Range   WBC 9.5 3.6 - 11.0 K/uL   RBC 4.84 3.80 - 5.20 MIL/uL   Hemoglobin 15.1 12.0 - 16.0 g/dL   HCT 43.2 35.0 - 47.0 %   MCV 89.3 80.0 - 100.0 fL   MCH 31.3 26.0 - 34.0 pg   MCHC 35.1 32.0 - 36.0 g/dL   RDW 14.2 11.5 - 14.5 %   Platelets 227 150 - 440 K/uL  Troponin I  Result Value Ref Range   Troponin I 0.05 (HH) <0.03 ng/mL  Troponin I  Result Value Ref Range   Troponin I 0.07 (HH) <0.03 ng/mL      Assessment & Plan:   Problem List Items Addressed This Visit      Digestive   GERD (gastroesophageal reflux disease)    Pt requests refills of omeprazole.  Refill provided as she reports no heartburn and no breakthrough symptoms.      Relevant Medications   omeprazole (PRILOSEC) 40 MG capsule      Other   Anxiety and depression - Primary   Relevant Medications   amitriptyline (ELAVIL) 10 MG tablet   clonazePAM (KLONOPIN) 0.5  MG tablet   venlafaxine XR (EFFEXOR-XR) 75 MG 24 hr capsule   venlafaxine XR (EFFEXOR-XR) 150 MG 24 hr capsule   Depression, major, recurrent (HCC)    Pt with severe anxiety with panic disorder and depression managed on venlafaxine and clonazepam.  Pt reports she has been unable to reduce clonazepam dose, but only used lower doses for 2 days.  Reports insomnia when not taking her medications.  Plan: 1. Continue Effexor 225 mg once daily 2. Reduce clonazepam.  Take 0.5 mg tablets to assist with dose reduction.  Pt was previously taking 1 mg during day and 2 mg at bedtime.  Will reduce to 0.5 mg daily for panic attack and 1.5 mg at bedtime for sleep.  Goal is completely stopping clonazepam and transitioning to alternative agent. 3. Encouraged sleep hygiene practices of routine at bedtime. 4. Will trial amitriptyline 10 mg at bedtime for sleep, depression/anxiety and possible benefit for pain control.  Low dose.  Cautioned pt about drowsiness, dizziness.  Should call clinic if having any additional sedation or daytime sleepiness. 5. Pt needs to transition her psychiatric care to [place].  Appointment pending. 6. Followup 4 weeks.      Relevant Medications   amitriptyline (ELAVIL) 10 MG tablet   venlafaxine XR (EFFEXOR-XR) 75 MG 24 hr capsule   venlafaxine XR (EFFEXOR-XR) 150 MG 24 hr capsule   Low back pain    Pt has long history of chronic back pain and prior interventional pain management treatment.  Pt declines neurosurgery options as well.  Has previously been managed only w/ opioid pain medication, but has now been off for several weeks.  Pt w/ hypertonicity of paraspinal muscles. Reports increased ability to perform ADLs and iADLs since starting baclofen, but reports taking 20 mg twice daily most days and will run out of medication.  Pt did not call clinic for this  dose increase.  Plan: 1. Discussed no chronic opioids from this clinic.  Pt verbalizes understanding. 2. May only take total of 40 mg baclofen daily.  Cautioned pt on side effects at high doses.  May increase to 10 mg qid since is helping with pain for ADLs. 3. Encouraged daily stretching as tolerated and consideration of PT. 4. Followup 4 weeks.      Relevant Medications   amitriptyline (ELAVIL) 10 MG tablet   baclofen (LIORESAL) 10 MG tablet   Hyperlipidemia    Pt requests refills of her isordil as she reports she will run out before Cardiology.  One refill provided.  Pt to contact their clinic for additional refills.      Relevant Medications   isosorbide dinitrate (ISORDIL) 30 MG tablet   atorvastatin (LIPITOR) 20 MG tablet   Psychophysiological insomnia    See Anxiety/Depression      Relevant Medications   amitriptyline (ELAVIL) 10 MG tablet    Other Visit Diagnoses    Coronary artery disease of native artery of native heart with stable angina pectoris (HCC)       Relevant Medications   isosorbide dinitrate (ISORDIL) 30 MG tablet   atorvastatin (LIPITOR) 20 MG tablet        Meds ordered this encounter  Medications  . amitriptyline (ELAVIL) 10 MG tablet    Sig: Take 1 tablet (10 mg total) by mouth at bedtime.    Dispense:  30 tablet    Refill:  0    Order Specific Question:   Supervising Provider    Answer:   Olin Hauser [[id number]]  .  clonazePAM (KLONOPIN) 0.5 MG tablet    Sig: Take 1 tablet (0.5mg ) during day for panic attack once as needed.  Take 3 tablets (1.5mg ) at night for sleep.    Dispense:  120 tablet    Refill:  0    Order Specific Question:   Supervising Provider    Answer:   Olin Hauser [[id number]]  . baclofen (LIORESAL) 10 MG tablet    Sig: Take 1 tablet (10 mg total) by mouth 4 (four) times daily.    Dispense:  120 each    Refill:  0    Order Specific Question:   Supervising Provider    Answer:   Olin Hauser [[id number]]    . omeprazole (PRILOSEC) 40 MG capsule    Sig: Take 1 capsule (40 mg total) by mouth daily.    Dispense:  90 capsule    Refill:  1    Order Specific Question:   Supervising Provider    Answer:   Olin Hauser [[id number]]  . venlafaxine XR (EFFEXOR-XR) 75 MG 24 hr capsule    Sig: Take 1 capsule (75 mg total) by mouth at bedtime.    Dispense:  90 capsule    Refill:  1    Order Specific Question:   Supervising Provider    Answer:   Olin Hauser [[id number]]  . venlafaxine XR (EFFEXOR-XR) 150 MG 24 hr capsule    Sig: Take 1 capsule (150 mg total) by mouth at bedtime.    Dispense:  90 capsule    Refill:  1    Order Specific Question:   Supervising Provider    Answer:   Olin Hauser [[id number]]  . isosorbide dinitrate (ISORDIL) 30 MG tablet    Sig: Take 1 tablet (30 mg total) by mouth daily.    Dispense:  30 tablet    Refill:  0    Order Specific Question:   Supervising Provider    Answer:   Olin Hauser [[id number]]  . atorvastatin (LIPITOR) 20 MG tablet    Sig: Take 1 tablet (20 mg total) by mouth at bedtime.    Dispense:  30 tablet    Refill:  5    Fill when pt requests refill.    Order Specific Question:   Supervising Provider    Answer:   Olin Hauser [[id number]]    Follow up plan: Return in about 4 weeks (around 12/04/2017) for anxiety and low back pain.  A total of 40 minutes was spent face-to-face with this patient. Greater than 50% of this time was spent in counseling and coordination of care with the patient regarding clonazepam dependence, possible withdrawal with dose reduction, addition of amitriptyline, sleep hygiene, alternative pain management approaches.  Cassell Smiles, DNP, AGPCNP-BC Adult Gerontology Primary Care Nurse Practitioner [facility] 11/14/2017, 8:09 AM

## 2017-11-06 NOTE — Patient Instructions (Addendum)
Jaid, Thank you for coming in to clinic today.  1. For your pain: - We are going to increase your baclofen to 10 mg four times daily. - We are also going to start amitriptyline 10 mg at night to help with your sleep and pain.  If you are getting some relief and you tolerate this medication well, we can increase it to help more with your pain.  2. For your panic and anxiety: - Continue taking Effexor without change. - Continue once daily as needed clonazepam at 0.5 mg one tablet for panic.  3. For your sleep: - Reduce your clonazepam to 1.5 mg (3 tablets) at bedtime.   - START amitriptyline 10 mg a bedtime for sleep and pain.  Caution for daytime drowsiness, dizziness or lightheadeness.  If this happens, call [employer] center.  4. Continue all other medications without changes.   Please schedule a follow-up appointment with Cassell Smiles, AGNP. Return in about 4 weeks (around 12/04/2017) for anxiety and low back pain.   If you have any other questions or concerns, please feel free to call the clinic or send a message through [place]. You may also schedule an earlier appointment if necessary.  You will receive a survey after today's visit either digitally by e-mail or paper by [employer]. Your experiences and feedback matter to [place].  Please respond so we know how we are doing as we provide care for you.   Cassell Smiles, DNP, AGNP-BC Adult Gerontology Nurse Practitioner [place]

## 2017-11-11 NOTE — Progress Notes (Deleted)
ANNUAL PREVENTATIVE CARE GYN  ENCOUNTER NOTE  Subjective:       Kim Buchanan is a 67 y.o. G74P1001 female here for a routine annual gynecologic exam.  Current complaints: 1.      Gynecologic History No LMP recorded. Patient is postmenopausal. Contraception: S/p tvh/bso Last Pap: 10/31/2016 neg/neg Results were: normal Last mammogram: 04/2014 birad 2 . Results were: normal  Obstetric History OB History  Gravida Para Term Preterm AB Living  1 1 1     1   SAB TAB Ectopic Multiple Live Births          1    # Outcome Date GA Lbr Len/2nd Weight Sex Delivery Anes PTL Lv  1 Term 1971   8 lb (3.629 kg) M Vag-Spont   LIV    Obstetric Comments  Son Passed at age 45 due to MVA    Past Medical History:  Diagnosis Date  . Anxiety   . Arthritis    lower back  . Depression   . GERD (gastroesophageal reflux disease)   . Hyperlipidemia   . Leaky heart valve    sees Dr Humphrey Rolls  . Panic attack   . PTSD (post-traumatic stress disorder)   . Rectal fistula   . Sleep apnea   . Wears dentures    full upper    Past Surgical History:  Procedure Laterality Date  . ABDOMINAL HYSTERECTOMY  10/2015  . COLONOSCOPY  2014  . COLONOSCOPY WITH PROPOFOL N/A 10/01/2016   Procedure: COLONOSCOPY WITH PROPOFOL;  Surgeon: Lucilla Lame, MD;  Location: [facility];  Service: Endoscopy;  Laterality: N/A;  . CYSTOCELE REPAIR N/A 12/17/2016   Procedure: ANTERIOR REPAIR (CYSTOCELE);  Surgeon: Brayton Mars, MD;  Location: [facility];  Service: Gynecology;  Laterality: N/A;  . POLYPECTOMY  10/01/2016   Procedure: POLYPECTOMY;  Surgeon: Lucilla Lame, MD;  Location: [facility];  Service: Endoscopy;;  . RECTAL SURGERY  06/28/2015   Rectal prolapse, laparoscopic rectopexy the coldCharmont, MD; [facility]  . TUBAL LIGATION    . VAGINAL HYSTERECTOMY Bilateral 12/17/2016   Procedure: HYSTERECTOMY VAGINAL WITH BILATERAL SALPINGO OOPHERECTOMY;  Surgeon: Brayton Mars, MD;  Location: [facility];  Service:  Gynecology;  Laterality: Bilateral;    Current Outpatient Medications on File Prior to Visit  Medication Sig Dispense Refill  . amitriptyline (ELAVIL) 10 MG tablet Take 1 tablet (10 mg total) by mouth at bedtime. 30 tablet 0  . aspirin 81 MG tablet Take 81 mg by mouth daily.     Marland Kitchen atorvastatin (LIPITOR) 20 MG tablet Take 1 tablet (20 mg total) by mouth at bedtime. 30 tablet 5  . baclofen (LIORESAL) 10 MG tablet Take 1 tablet (10 mg total) by mouth 4 (four) times daily. 120 each 0  . clonazePAM (KLONOPIN) 0.5 MG tablet Take 1 tablet (0.5mg ) during day for panic attack once as needed.  Take 3 tablets (1.5mg ) at night for sleep. 120 tablet 0  . docusate sodium (COLACE) 100 MG capsule Take 1 capsule (100 mg total) by mouth 2 (two) times daily. (Patient not taking: Reported on 11/06/2017) 10 capsule 0  . ibuprofen (ADVIL,MOTRIN) 800 MG tablet Take 1 tablet (800 mg total) by mouth 3 (three) times daily. 50 tablet 1  . isosorbide dinitrate (ISORDIL) 30 MG tablet Take 1 tablet (30 mg total) by mouth daily. 30 tablet 0  . omeprazole (PRILOSEC) 40 MG capsule Take 1 capsule (40 mg total) by mouth daily. 90 capsule 1  . polyethylene glycol (MIRALAX /  GLYCOLAX) packet Take 17 g by mouth daily as needed.     . senna (SENOKOT) 8.6 MG TABS tablet Take 2 tablets by mouth daily as needed for mild constipation.    Marland Kitchen venlafaxine XR (EFFEXOR-XR) 150 MG 24 hr capsule Take 1 capsule (150 mg total) by mouth at bedtime. 90 capsule 1  . venlafaxine XR (EFFEXOR-XR) 75 MG 24 hr capsule Take 1 capsule (75 mg total) by mouth at bedtime. 90 capsule 1   No current facility-administered medications on file prior to visit.     No Known Allergies  Social History   Socioeconomic History  . Marital status: Married    Spouse name: Not on file  . Number of children: Not on file  . Years of education: Not on file  . Highest education level: Not on file  Social Needs  . Financial resource strain: Not on file  . Food  insecurity - worry: Not on file  . Food insecurity - inability: Not on file  . Transportation needs - medical: Not on file  . Transportation needs - non-medical: Not on file  Occupational History  . Not on file  Tobacco Use  . Smoking status: Current Every Day Smoker    Packs/day: 0.50    Years: 45.00    Pack years: 22.50    Types: Cigarettes  . Smokeless tobacco: Never Used  . Tobacco comment: started at age 10  Substance and Sexual Activity  . Alcohol use: No    Alcohol/week: 0.0 oz  . Drug use: No  . Sexual activity: Not Currently  Other Topics Concern  . Not on file  Social History Narrative  . Not on file    Family History  Problem Relation Age of Onset  . Diabetes Mother   . Diabetes Sister   . Diabetes Brother   . Lymphoma Brother   . Heart disease Father   . Diabetes Sister   . Diabetes Brother   . Lymphoma Brother   . Heart disease Brother   . Healthy Sister   . Breast cancer Neg Hx   . Ovarian cancer Neg Hx   . Colon cancer Neg Hx     The following portions of the patient's history were reviewed and updated as appropriate: allergies, current medications, past family history, past medical history, past social history, past surgical history and problem list.  Review of Systems ROS   Objective:   There were no vitals taken for this visit.  CONSTITUTIONAL: Well-developed, well-nourished female in no acute distress.  PSYCHIATRIC: Normal mood and affect. Normal behavior. Normal judgment and thought content. [place]: Alert and oriented to person, place, and time. Normal muscle tone coordination. No cranial nerve deficit noted. HENT:  Normocephalic, atraumatic, External right and left ear normal. Oropharynx is clear and moist EYES: Conjunctivae and EOM are normal. Pupils are equal, round, and reactive to light. No scleral icterus.  NECK: Normal range of motion, supple, no masses.  Normal thyroid.  SKIN: Skin is warm and dry. No rash noted. Not diaphoretic.  No erythema. No pallor. CARDIOVASCULAR: Normal heart rate noted, regular rhythm, no murmur. RESPIRATORY: Clear to auscultation bilaterally. Effort and breath sounds normal, no problems with respiration noted. BREASTS: Symmetric in size. No masses, skin changes, nipple drainage, or lymphadenopathy. ABDOMEN: Soft, normal bowel sounds, no distention noted.  No tenderness, rebound or guarding.  BLADDER: Normal PELVIC:  External Genitalia: Normal  BUS: Normal  Vagina: Mild atrophic changes  Cervix: Normal; parous; prolapsed to the introitus  Uterus: Normal; midplane, mobile, nontender, normal size and shape  Adnexa: Normal; nonpalpable and nontender  RV: External Exam NormaI, No Rectal Masses and Normal Sphincter tone  MUSCULOSKELETAL: Normal range of motion. No tenderness.  No cyanosis, clubbing, or edema.  2+ distal pulses. LYMPHATIC: No Axillary, Supraclavicular, or Inguinal Adenopathy.    Assessment:   Annual gynecologic examination 67 y.o. Contraception: post menopausal status bmi-30 Vaginal atrophy    Plan:  Pap: not needed Mammogram: Ordered Stool Guaiac Testing: ordered -- Colonoscopy 09/2016- polyps- repeat in 5 years Labs: thru pcp Routine preventative health maintenance measures emphasized: Exercise/Diet/Weight control, Tobacco Warnings and Alcohol/Substance use risks  Estrace cream intravaginal  Return to [place], [place]   Note: This dictation was prepared with Dragon dictation along with smaller phrase technology. Any transcriptional errors that result from this process are unintentional.

## 2017-11-13 ENCOUNTER — Emergency Department: Payer: Medicare Other

## 2017-11-13 ENCOUNTER — Encounter: Payer: Self-pay | Admitting: Emergency Medicine

## 2017-11-13 ENCOUNTER — Inpatient Hospital Stay
Admission: EM | Admit: 2017-11-13 | Discharge: 2017-11-22 | DRG: 917 | Disposition: A | Discharge status: Home Health | Payer: Medicare Other | Attending: Internal Medicine | Admitting: Internal Medicine

## 2017-11-13 DIAGNOSIS — G92 Toxic encephalopathy: Secondary | ICD-10-CM | POA: Diagnosis present

## 2017-11-13 DIAGNOSIS — J9601 Acute respiratory failure with hypoxia: Secondary | ICD-10-CM | POA: Diagnosis present

## 2017-11-13 DIAGNOSIS — F17210 Nicotine dependence, cigarettes, uncomplicated: Secondary | ICD-10-CM | POA: Diagnosis present

## 2017-11-13 DIAGNOSIS — J440 Chronic obstructive pulmonary disease with acute lower respiratory infection: Secondary | ICD-10-CM | POA: Diagnosis present

## 2017-11-13 DIAGNOSIS — F410 Panic disorder [episodic paroxysmal anxiety] without agoraphobia: Secondary | ICD-10-CM | POA: Diagnosis present

## 2017-11-13 DIAGNOSIS — T428X1A Poisoning by antiparkinsonism drugs and other central muscle-tone depressants, accidental (unintentional), initial encounter: Principal | ICD-10-CM | POA: Diagnosis present

## 2017-11-13 DIAGNOSIS — R401 Stupor: Secondary | ICD-10-CM | POA: Diagnosis not present

## 2017-11-13 DIAGNOSIS — R4182 Altered mental status, unspecified: Secondary | ICD-10-CM | POA: Diagnosis present

## 2017-11-13 DIAGNOSIS — F339 Major depressive disorder, recurrent, unspecified: Secondary | ICD-10-CM | POA: Diagnosis present

## 2017-11-13 DIAGNOSIS — Z4659 Encounter for fitting and adjustment of other gastrointestinal appliance and device: Secondary | ICD-10-CM

## 2017-11-13 DIAGNOSIS — F329 Major depressive disorder, single episode, unspecified: Secondary | ICD-10-CM | POA: Diagnosis not present

## 2017-11-13 DIAGNOSIS — R4189 Other symptoms and signs involving cognitive functions and awareness: Secondary | ICD-10-CM

## 2017-11-13 DIAGNOSIS — F4310 Post-traumatic stress disorder, unspecified: Secondary | ICD-10-CM | POA: Diagnosis present

## 2017-11-13 DIAGNOSIS — K219 Gastro-esophageal reflux disease without esophagitis: Secondary | ICD-10-CM | POA: Diagnosis present

## 2017-11-13 DIAGNOSIS — F418 Other specified anxiety disorders: Secondary | ICD-10-CM | POA: Diagnosis present

## 2017-11-13 DIAGNOSIS — E781 Pure hyperglyceridemia: Secondary | ICD-10-CM | POA: Diagnosis present

## 2017-11-13 DIAGNOSIS — J189 Pneumonia, unspecified organism: Secondary | ICD-10-CM | POA: Diagnosis not present

## 2017-11-13 DIAGNOSIS — Z8249 Family history of ischemic heart disease and other diseases of the circulatory system: Secondary | ICD-10-CM

## 2017-11-13 DIAGNOSIS — Z7982 Long term (current) use of aspirin: Secondary | ICD-10-CM | POA: Diagnosis not present

## 2017-11-13 DIAGNOSIS — R739 Hyperglycemia, unspecified: Secondary | ICD-10-CM | POA: Diagnosis present

## 2017-11-13 DIAGNOSIS — Z90710 Acquired absence of both cervix and uterus: Secondary | ICD-10-CM | POA: Diagnosis not present

## 2017-11-13 DIAGNOSIS — J9602 Acute respiratory failure with hypercapnia: Secondary | ICD-10-CM | POA: Diagnosis not present

## 2017-11-13 DIAGNOSIS — J441 Chronic obstructive pulmonary disease with (acute) exacerbation: Secondary | ICD-10-CM | POA: Diagnosis not present

## 2017-11-13 DIAGNOSIS — F411 Generalized anxiety disorder: Secondary | ICD-10-CM | POA: Diagnosis not present

## 2017-11-13 DIAGNOSIS — T4271XA Poisoning by unspecified antiepileptic and sedative-hypnotic drugs, accidental (unintentional), initial encounter: Secondary | ICD-10-CM

## 2017-11-13 DIAGNOSIS — J449 Chronic obstructive pulmonary disease, unspecified: Secondary | ICD-10-CM | POA: Diagnosis not present

## 2017-11-13 DIAGNOSIS — E785 Hyperlipidemia, unspecified: Secondary | ICD-10-CM | POA: Diagnosis present

## 2017-11-13 DIAGNOSIS — R5381 Other malaise: Secondary | ICD-10-CM | POA: Diagnosis not present

## 2017-11-13 DIAGNOSIS — R0602 Shortness of breath: Secondary | ICD-10-CM

## 2017-11-13 DIAGNOSIS — J9690 Respiratory failure, unspecified, unspecified whether with hypoxia or hypercapnia: Secondary | ICD-10-CM

## 2017-11-13 DIAGNOSIS — J9600 Acute respiratory failure, unspecified whether with hypoxia or hypercapnia: Secondary | ICD-10-CM

## 2017-11-13 LAB — URINALYSIS, ROUTINE W REFLEX MICROSCOPIC
Bacteria, UA: NONE SEEN
Bilirubin Urine: NEGATIVE
Glucose, UA: NEGATIVE mg/dL
Ketones, ur: NEGATIVE mg/dL
Leukocytes, UA: NEGATIVE
Nitrite: NEGATIVE
Protein, ur: NEGATIVE mg/dL
Specific Gravity, Urine: 1.025 (ref 1.005–1.030)
pH: 5 (ref 5.0–8.0)

## 2017-11-13 LAB — ETHANOL: Alcohol, Ethyl (B): <10 mg/dL (ref <10)

## 2017-11-13 LAB — CBC
HCT: 39.2 % (ref 35.0–47.0)
Hemoglobin: 13.2 g/dL (ref 12.0–16.0)
MCH: 30.7 pg (ref 26.0–34.0)
MCHC: 33.7 g/dL (ref 32.0–36.0)
MCV: 91.2 fL (ref 80.0–100.0)
Platelets: 230 10*3/uL (ref 150–440)
RBC: 4.3 MIL/uL (ref 3.80–5.20)
RDW: 13.3 % (ref 11.5–14.5)
WBC: 7.3 10*3/uL (ref 3.6–11.0)

## 2017-11-13 LAB — COMPREHENSIVE METABOLIC PANEL
ALT: 18 U/L (ref 14–54)
AST: 19 U/L (ref 15–41)
Albumin: 3.9 g/dL (ref 3.5–5.0)
Alkaline Phosphatase: 94 U/L (ref 38–126)
Anion gap: 7 (ref 5–15)
BUN: 21 mg/dL — ABNORMAL HIGH (ref 6–20)
CO2: 25 mmol/L (ref 22–32)
Calcium: 8.3 mg/dL — ABNORMAL LOW (ref 8.9–10.3)
Chloride: 110 mmol/L (ref 101–111)
Creatinine, Ser: 0.87 mg/dL (ref 0.44–1.00)
GFR calc Af Amer: >60 mL/min (ref >60)
GFR calc non Af Amer: >60 mL/min (ref >60)
Glucose, Bld: 94 mg/dL (ref 65–99)
Potassium: 3.9 mmol/L (ref 3.5–5.1)
Sodium: 142 mmol/L (ref 135–145)
Total Bilirubin: 0.5 mg/dL (ref 0.3–1.2)
Total Protein: 7.1 g/dL (ref 6.5–8.1)

## 2017-11-13 LAB — URINE DRUG SCREEN, QUALITATIVE (ARMC ONLY)
Amphetamines, Ur Screen: NOT DETECTED
Barbiturates, Ur Screen: NOT DETECTED
Benzodiazepine, Ur Scrn: POSITIVE — AB
Cannabinoid 50 Ng, Ur ~~LOC~~: NOT DETECTED
Cocaine Metabolite,Ur ~~LOC~~: NOT DETECTED
MDMA (Ecstasy)Ur Screen: NOT DETECTED
Methadone Scn, Ur: NOT DETECTED
Opiate, Ur Screen: NOT DETECTED
Phencyclidine (PCP) Ur S: NOT DETECTED
Tricyclic, Ur Screen: POSITIVE — AB

## 2017-11-13 LAB — BLOOD GAS, ARTERIAL
Acid-base deficit: 3.7 mmol/L — ABNORMAL HIGH (ref 0.0–2.0)
Bicarbonate: 25.2 mmol/L (ref 20.0–28.0)
FIO2: 0.4
Mechanical Rate: 18
O2 Saturation: 95.6 %
Patient temperature: 37
pCO2 arterial: 63 mmHg — ABNORMAL HIGH (ref 32.0–48.0)
pH, Arterial: 7.21 — ABNORMAL LOW (ref 7.350–7.450)
pO2, Arterial: 95 mmHg (ref 83.0–108.0)

## 2017-11-13 LAB — TROPONIN I: Troponin I: <0.03 ng/mL (ref <0.03)

## 2017-11-13 LAB — ACETAMINOPHEN LEVEL: Acetaminophen (Tylenol), Serum: <10 ug/mL — ABNORMAL LOW (ref 10–30)

## 2017-11-13 LAB — SALICYLATE LEVEL: Salicylate Lvl: <7 mg/dL (ref 2.8–30.0)

## 2017-11-13 MED ORDER — NALOXONE HCL 2 MG/2ML IJ SOSY
1.0000 mg | PREFILLED_SYRINGE | Freq: Once | INTRAMUSCULAR | Status: AC
  Administered 2017-11-13: 1 mg via INTRAVENOUS

## 2017-11-13 MED ORDER — ETOMIDATE 2 MG/ML IV SOLN
10.0000 mg | Freq: Once | INTRAVENOUS | Status: AC
  Administered 2017-11-13: 10 mg via INTRAVENOUS

## 2017-11-13 MED ORDER — NALOXONE HCL 2 MG/2ML IJ SOSY
1.0000 mg | PREFILLED_SYRINGE | Freq: Once | INTRAMUSCULAR | Status: AC
  Administered 2017-11-13: 1 mg via INTRAVENOUS
  Filled 2017-11-13: qty 2

## 2017-11-13 MED ORDER — SUCCINYLCHOLINE CHLORIDE 20 MG/ML IJ SOLN
100.0000 mg | Freq: Once | INTRAMUSCULAR | Status: AC
  Administered 2017-11-13: 100 mg via INTRAVENOUS

## 2017-11-13 MED ORDER — NALOXONE HCL 2 MG/2ML IJ SOSY
2.0000 mg | PREFILLED_SYRINGE | Freq: Once | INTRAMUSCULAR | Status: AC
  Administered 2017-11-13: 2 mg via INTRAVENOUS

## 2017-11-13 MED ORDER — SODIUM CHLORIDE 0.9 % IV BOLUS (SEPSIS)
1000.0000 mL | Freq: Once | INTRAVENOUS | Status: AC
  Administered 2017-11-13: 1000 mL via INTRAVENOUS

## 2017-11-13 MED ORDER — NALOXONE HCL 2 MG/2ML IJ SOSY
PREFILLED_SYRINGE | INTRAMUSCULAR | Status: AC
  Administered 2017-11-13: 2 mg via INTRAVENOUS
  Filled 2017-11-13: qty 2

## 2017-11-13 MED ORDER — PROPOFOL 1000 MG/100ML IV EMUL
5.0000 ug/kg/min | Freq: Once | INTRAVENOUS | Status: AC
  Administered 2017-11-13: 5 ug/kg/min via INTRAVENOUS
  Filled 2017-11-13: qty 100

## 2017-11-13 NOTE — Progress Notes (Signed)
RR increased to 24 after ABG.

## 2017-11-13 NOTE — ED Notes (Addendum)
Patient bedside X-ray

## 2017-11-13 NOTE — ED Triage Notes (Signed)
Pt comes into the ED via [facility] from home where her husband called and states that she was acting much more lethargic than normal.  He was concerned she may have accidentally taken too many of her baclofen muscle relaxer's.  Patient states she only took two and denies taking any other medications.  Patient is sleeping but can be aroused to answer questions.  Patient denies has even and unlabored respirations at this time and is in NAD.  Patient states she takes the baclofen for her chronic back pain.

## 2017-11-13 NOTE — ED Notes (Signed)
No change in LOC after Narcan doses

## 2017-11-13 NOTE — Consult Note (Signed)
Name: JAALIYAH LUCATERO MRN: [id number] DOB: [birth year]    ADMISSION DATE:  11/13/2017 CONSULTATION DATE: 11/13/2017  REFERRING MD : Dr. Jerelyn Charles   CHIEF COMPLAINT: Altered Mental Status   BRIEF PATIENT DESCRIPTION:  67 yo female admitted with acute encephalopathy likely secondary to accidental vs. unintentional drug overdose equiring mechanical intubation for airway protection   SIGNIFICANT EVENTS  01/23-Pt admitted to ICU mechanically intubated   STUDIES:  CT Head 01/23>>negative   HISTORY OF PRESENT ILLNESS:   This is a 67 yo female with a PMH of OSA, Rectal Fistula, PTSD, Panic Attacks. Leaky Heart Valve, Hyperlipidemia, GERD, Depression, Arthritis, and Anxiety.  She presented to [facility] ER via EMS 01/23 with altered mental status with episodes of unresponsiveness.  Per ER notes the pts husband stated the pt has been acting more confused then normal and having difficulty walking due to an unsteady gait onset of symptoms 01/23.  Her symptoms worsened throughout the day, therefore he notified EMS.  He also stated 5 days prior to presentation to the ER she had a baclofen prescription filled and he does not know how many pills she took.  In the past she has accidentally taken to much medication, however she currently does not have access to any narcotics.  In the ER the pt was initially somnolent awakening to strong verbal stimulation and light physical stimulation.  She received iv narcan without improvement of mentation, and due to worsening mentation the pt was mechanically intubated for airway protection.  Urine drug screen positive for tricyclics and benzodiazepine.  CXR negative, CT Head negative, and EKG revealed left BBB that is not a new finding.  She was subsequently admitted to ICU by hospitalist team for further workup and treatment PCCM consulted for vent management.    PAST MEDICAL HISTORY :   has a past medical history of Anxiety, Arthritis, Depression, GERD (gastroesophageal reflux  disease), Hyperlipidemia, Leaky heart valve, Panic attack, PTSD (post-traumatic stress disorder), Rectal fistula, Sleep apnea, and Wears dentures.  has a past surgical history that includes Rectal surgery (06/28/2015); Colonoscopy (2014); Colonoscopy with propofol (N/A, 10/01/2016); polypectomy (10/01/2016); Tubal ligation; Vaginal hysterectomy (Bilateral, 12/17/2016); Cystocele repair (N/A, 12/17/2016); and Abdominal hysterectomy (10/2015). Prior to Admission medications   Medication Sig Start Date End Date Taking? Authorizing Provider  atorvastatin (LIPITOR) 20 MG tablet Take 1 tablet (20 mg total) by mouth at bedtime. 11/06/17  Yes Mikey College, NP  baclofen (LIORESAL) 10 MG tablet Take 1 tablet (10 mg total) by mouth 4 (four) times daily. 11/06/17  Yes Mikey College, NP  isosorbide dinitrate (ISORDIL) 30 MG tablet Take 1 tablet (30 mg total) by mouth daily. 11/06/17  Yes Mikey College, NP  omeprazole (PRILOSEC) 40 MG capsule Take 1 capsule (40 mg total) by mouth daily. 11/06/17  Yes Mikey College, NP  polyethylene glycol [facility] / GLYCOLAX) packet Take 17 g by mouth daily as needed.    Yes [provider]  senna (SENOKOT) 8.6 MG TABS tablet Take 2 tablets by mouth daily as needed for mild constipation.   Yes [provider]  venlafaxine XR (EFFEXOR-XR) 150 MG 24 hr capsule Take 1 capsule (150 mg total) by mouth at bedtime. 11/06/17  Yes Mikey College, NP  venlafaxine XR (EFFEXOR-XR) 75 MG 24 hr capsule Take 1 capsule (75 mg total) by mouth at bedtime. 11/06/17  Yes Mikey College, NP  amitriptyline (ELAVIL) 10 MG tablet Take 1 tablet (10 mg total) by mouth at bedtime. 11/06/17  Mikey College, NP  aspirin 81 MG tablet Take 81 mg by mouth daily.     [provider]  clonazePAM (KLONOPIN) 0.5 MG tablet Take 1 tablet (0.45m) during day for panic attack once as needed.  Take 3 tablets (1.522m at night for sleep. 11/06/17   KeMikey CollegeNP  docusate sodium (COLACE) 100 MG capsule Take 1 capsule (100 mg total) by mouth 2 (two) times daily. Patient not taking: Reported on 11/06/2017 12/18/16   Defrancesco, MaAlanda SlimMD  ibuprofen (ADVIL,MOTRIN) 800 MG tablet Take 1 tablet (800 mg total) by mouth 3 (three) times daily. Patient not taking: Reported on 11/13/2017 12/18/16   Defrancesco, MaAlanda SlimMD   No Known Allergies  FAMILY HISTORY:  family history includes Diabetes in her brother, brother, mother, sister, and sister; Healthy in her sister; Heart disease in her brother and father; Lymphoma in her brother and brother. SOCIAL HISTORY:  reports that she has been smoking cigarettes.  She has a 22.50 pack-year smoking history. she has never used smokeless tobacco. She reports that she does not drink alcohol or use drugs.  REVIEW OF SYSTEMS:   Unable to assess pt intubated  SUBJECTIVE:  Unable to assess pt intubated   VITAL SIGNS: Temp:  [98.7 F (37.1 C)] 98.7 F (37.1 C) (01/23 1847) Pulse Rate:  [68-87] 73 (01/23 2300) Resp:  [6-25] 24 (01/23 2300) BP: (91-170)/(45-99) 144/86 (01/23 2300) SpO2:  [89 %-100 %] 99 % (01/23 2300) FiO2 (%):  [40 %] 40 % (01/23 2154) Weight:  [72.6 kg (160 lb)] 72.6 kg (160 lb) (01/23 1852)  PHYSICAL EXAMINATION: General: well developed, well nourished female, mechanically intubated NAD Neuro: sedated, follows commands, PERRL  HEENT: supple, no JVD  Cardiovascular: sinus rhythm with left BBB, no M/R/G Lungs: rhonchi throughout, even, non labored  Abdomen: +BS x4, soft, non tender, non distended  Musculoskeletal: normal bulk and tone, no edema  Skin: intact no rashes or lesions  Recent Labs  Lab 11/13/17 1853  NA 142  K 3.9  CL 110  CO2 25  BUN 21*  CREATININE 0.87  GLUCOSE 94   Recent Labs  Lab 11/13/17 1853  HGB 13.2  HCT 39.2  WBC 7.3  PLT 230   Ct Head Wo Contrast  Result Date: 11/13/2017 CLINICAL DATA:  Altered mental status EXAM: CT HEAD WITHOUT  CONTRAST TECHNIQUE: Contiguous axial images were obtained from the base of the skull through the vertex without intravenous contrast. COMPARISON:  His head CT 12/19/2011 FINDINGS: Brain: No mass lesion, intraparenchymal hemorrhage or extra-axial collection. No evidence of acute cortical infarct. Brain parenchyma and CSF-containing spaces are normal for age. Vascular: No hyperdense vessel or unexpected calcification. Skull: Normal visualized skull base, calvarium and extracranial soft tissues. Sinuses/Orbits: No sinus fluid levels or advanced mucosal thickening. No mastoid effusion. Normal orbits. IMPRESSION: Normal head CT. Electronically Signed   By: KeUlyses Jarred.D.   On: 11/13/2017 20:01   Dg Chest Portable 1 View  Result Date: 11/13/2017 CLINICAL DATA:  Endotracheal tube placement EXAM: PORTABLE CHEST 1 VIEW COMPARISON:  11/13/2017 FINDINGS: Endotracheal tube was placed with tip measuring 2.9 cm above the carina. Shallow inspiration with linear atelectasis in the lung bases. Heart size and pulmonary vascularity are normal. No consolidation or airspace disease in the lungs. No blunting of costophrenic angles. No pneumothorax. IMPRESSION: Endotracheal tube appears in satisfactory position. Shallow inspiration with atelectasis in the lung bases. Electronically Signed   By: WiOren Beckmann.  On: 11/13/2017 20:58   Dg Chest Portable 1 View  Result Date: 11/13/2017 CLINICAL DATA:  66 year old female with a history altered mental status EXAM: PORTABLE CHEST 1 VIEW COMPARISON:  07/19/2017 FINDINGS: Low lung volumes accentuating the interstitium. Cardiomediastinal silhouette likely unchanged in size and contour. No pneumothorax or pleural effusion. No new confluent airspace disease. Linear/reticular opacity at the left lung base, likely atelectasis. No displaced fracture. IMPRESSION: Low lung volumes without evidence of superimposed acute cardiopulmonary disease. Electronically Signed   By: Corrie Mckusick  D.O.   On: 11/13/2017 19:52    ASSESSMENT / PLAN: Acute encephalopathy likely secondary to intentional vs. accidental drug overdose  Mechanical Intubation  P: Full vent support-vent settings reviewed and established  Prn CXR VAP bundle implemented  SBT once all parameters met  Continuous telemetry monitoring Maintain map >65 Trend BMP  Replace electrolytes as indicated Monitor UOP  Lovenox for VTE prophylaxis  Trend CBC  Monitor for s/sx of bleeding  Transfuse for hgb <7 Pepcid for PUD prophylaxis  Will start tube feeds in the next 24 hrs if pt remains intubated  Maintain RASS goal 0 to -1 Prn propofol gtt to maintain RASS goal  Prn fentanyl for pain management  Will perform medication count if pt has overdosed on amitriptyline will start sodium bicarb gtt Repeat EKG in am and prn  Seizure precautions  Hold outpatient psych medications for now   Marda Stalker, [place] Pager [phone] (please enter 7 digits) PCCM Consult Pager [phone] (please enter 7 digits)

## 2017-11-13 NOTE — ED Notes (Signed)
Patient transported to CT 

## 2017-11-13 NOTE — H&P (Signed)
Kim Buchanan at [place] NAME: Kim Buchanan    MR#:  [id number]  DATE OF BIRTH:  Jan 10, 1951  DATE OF ADMISSION:  11/13/2017  PRIMARY CARE PHYSICIAN: Mikey College, NP   REQUESTING/REFERRING PHYSICIAN: Kerman Passey, MD  CHIEF COMPLAINT:   Chief Complaint  Patient presents with  . Drug Overdose  . Altered Mental Status    HISTORY OF PRESENT ILLNESS:  Kim Buchanan  is a 67 y.o. female who presents obtunded.  Patient was responsive only to painful stimuli on presentation in the ED.  Patient's husband reports that he feels she may have taken more medications than she was prescribed.  Patient is on baclofen, Klonopin, Elavil, venlafaxine.  EMS also found the patient's husband's medication trazodone and the patient's medication bag.  Her husband states that she typically manages the medication, but that he feels like he has noted her taking extra baclofen tablets in the past.  At some point in the past she had a presentation similar to this, though not as extreme in the ED when she had taken some extra medication.  He denies that she has any intention of self-harm.  In the ED she had to be intubated due to a significantly low respiratory rate and some hypercarbia.  Other workup in the ED is largely benign.  Hospitals were called for admission  PAST MEDICAL HISTORY:   Past Medical History:  Diagnosis Date  . Anxiety   . Arthritis    lower back  . Depression   . GERD (gastroesophageal reflux disease)   . Hyperlipidemia   . Leaky heart valve    sees Dr Humphrey Rolls  . Panic attack   . PTSD (post-traumatic stress disorder)   . Rectal fistula   . Sleep apnea   . Wears dentures    full upper    PAST SURGICAL HISTORY:   Past Surgical History:  Procedure Laterality Date  . ABDOMINAL HYSTERECTOMY  10/2015  . COLONOSCOPY  2014  . COLONOSCOPY WITH PROPOFOL N/A 10/01/2016   Procedure: COLONOSCOPY WITH PROPOFOL;  Surgeon: Lucilla Lame, MD;   Location: [place];  Service: Endoscopy;  Laterality: N/A;  . CYSTOCELE REPAIR N/A 12/17/2016   Procedure: ANTERIOR REPAIR (CYSTOCELE);  Surgeon: Brayton Mars, MD;  Location: [facility];  Service: Gynecology;  Laterality: N/A;  . POLYPECTOMY  10/01/2016   Procedure: POLYPECTOMY;  Surgeon: Lucilla Lame, MD;  Location: [place];  Service: Endoscopy;;  . RECTAL SURGERY  06/28/2015   Rectal prolapse, laparoscopic rectopexy the coldCharmont, MD; [facility]  . TUBAL LIGATION    . VAGINAL HYSTERECTOMY Bilateral 12/17/2016   Procedure: HYSTERECTOMY VAGINAL WITH BILATERAL SALPINGO OOPHERECTOMY;  Surgeon: Brayton Mars, MD;  Location: [facility];  Service: Gynecology;  Laterality: Bilateral;    SOCIAL HISTORY:   Social History   Tobacco Use  . Smoking status: Current Every Day Smoker    Packs/day: 0.50    Years: 45.00    Pack years: 22.50    Types: Cigarettes  . Smokeless tobacco: Never Used  . Tobacco comment: started at age 60  Substance Use Topics  . Alcohol use: No    Alcohol/week: 0.0 oz    FAMILY HISTORY:   Family History  Problem Relation Age of Onset  . Diabetes Mother   . Diabetes Sister   . Diabetes Brother   . Lymphoma Brother   . Heart disease Father   . Diabetes Sister   . Diabetes Brother   .  Lymphoma Brother   . Heart disease Brother   . Healthy Sister   . Breast cancer Neg Hx   . Ovarian cancer Neg Hx   . Colon cancer Neg Hx     DRUG ALLERGIES:  No Known Allergies  MEDICATIONS AT HOME:   Prior to Admission medications   Medication Sig Start Date End Date Taking? Authorizing Provider  atorvastatin (LIPITOR) 20 MG tablet Take 1 tablet (20 mg total) by mouth at bedtime. 11/06/17  Yes Mikey College, NP  baclofen (LIORESAL) 10 MG tablet Take 1 tablet (10 mg total) by mouth 4 (four) times daily. 11/06/17  Yes Mikey College, NP  isosorbide dinitrate (ISORDIL) 30 MG tablet Take 1 tablet (30 mg total) by mouth daily. 11/06/17   Yes Mikey College, NP  omeprazole (PRILOSEC) 40 MG capsule Take 1 capsule (40 mg total) by mouth daily. 11/06/17  Yes Mikey College, NP  polyethylene glycol [facility] / GLYCOLAX) packet Take 17 g by mouth daily as needed.    Yes [provider]  senna (SENOKOT) 8.6 MG TABS tablet Take 2 tablets by mouth daily as needed for mild constipation.   Yes [provider]  venlafaxine XR (EFFEXOR-XR) 150 MG 24 hr capsule Take 1 capsule (150 mg total) by mouth at bedtime. 11/06/17  Yes Mikey College, NP  venlafaxine XR (EFFEXOR-XR) 75 MG 24 hr capsule Take 1 capsule (75 mg total) by mouth at bedtime. 11/06/17  Yes Mikey College, NP  amitriptyline (ELAVIL) 10 MG tablet Take 1 tablet (10 mg total) by mouth at bedtime. 11/06/17   Mikey College, NP  aspirin 81 MG tablet Take 81 mg by mouth daily.     [provider]  clonazePAM (KLONOPIN) 0.5 MG tablet Take 1 tablet (0.5mg ) during day for panic attack once as needed.  Take 3 tablets (1.5mg ) at night for sleep. 11/06/17   Mikey College, NP  docusate sodium (COLACE) 100 MG capsule Take 1 capsule (100 mg total) by mouth 2 (two) times daily. Patient not taking: Reported on 11/06/2017 12/18/16   Defrancesco, Alanda Slim, MD  ibuprofen (ADVIL,MOTRIN) 800 MG tablet Take 1 tablet (800 mg total) by mouth 3 (three) times daily. Patient not taking: Reported on 11/13/2017 12/18/16   Defrancesco, Alanda Slim, MD    REVIEW OF SYSTEMS:  Review of Systems  Unable to perform ROS: Critical illness     VITAL SIGNS:   Vitals:   11/13/17 2130 11/13/17 2200 11/13/17 2230 11/13/17 2300  BP: 111/60 131/81 138/79 (!) 144/86  Pulse: 71 68 72 73  Resp: 20 (!) 25 18 (!) 24  Temp:      TempSrc:      SpO2: 100% 100% 99% 99%  Weight:      Height:       Wt Readings from Last 3 Encounters:  11/13/17 72.6 kg (160 lb)  11/06/17 72.8 kg (160 lb 6.4 oz)  10/07/17 73.6 kg (162 lb 3.2 oz)    PHYSICAL EXAMINATION:   Physical Exam  Vitals reviewed. Constitutional: She appears well-developed and well-nourished. No distress.  HENT:  Head: Normocephalic and atraumatic.  Mouth/Throat: Oropharynx is clear and moist.  Eyes: Conjunctivae and EOM are normal. Pupils are equal, round, and reactive to light. No scleral icterus.  Neck: Normal range of motion. Neck supple. No JVD present. No thyromegaly present.  Cardiovascular: Normal rate, regular rhythm and intact distal pulses. Exam reveals no gallop and no friction rub.  No murmur heard.  Respiratory: No respiratory distress. She has no wheezes. She has no rales.  Intubated and mechanically ventilated  GI: Soft. Bowel sounds are normal. She exhibits no distension. There is no tenderness.  Musculoskeletal: Normal range of motion. She exhibits no edema.  No arthritis, no gout  Lymphadenopathy:    She has no cervical adenopathy.  Neurological:  Unable to fully assess due to patient condition  Skin: Skin is warm and dry. No rash noted. No erythema.  Psychiatric:  Unable to assess due to patient condition    LABORATORY PANEL:   CBC Recent Labs  Lab 11/13/17 1853  WBC 7.3  HGB 13.2  HCT 39.2  PLT 230   ------------------------------------------------------------------------------------------------------------------  Chemistries  Recent Labs  Lab 11/13/17 1853  NA 142  K 3.9  CL 110  CO2 25  GLUCOSE 94  BUN 21*  CREATININE 0.87  CALCIUM 8.3*  AST 19  ALT 18  ALKPHOS 94  BILITOT 0.5   ------------------------------------------------------------------------------------------------------------------  Cardiac Enzymes Recent Labs  Lab 11/13/17 1901  TROPONINI <0.03   ------------------------------------------------------------------------------------------------------------------  RADIOLOGY:  Ct Head Wo Contrast  Result Date: 11/13/2017 CLINICAL DATA:  Altered mental status EXAM: CT HEAD WITHOUT CONTRAST TECHNIQUE: Contiguous axial  images were obtained from the base of the skull through the vertex without intravenous contrast. COMPARISON:  His head CT 12/19/2011 FINDINGS: Brain: No mass lesion, intraparenchymal hemorrhage or extra-axial collection. No evidence of acute cortical infarct. Brain parenchyma and CSF-containing spaces are normal for age. Vascular: No hyperdense vessel or unexpected calcification. Skull: Normal visualized skull base, calvarium and extracranial soft tissues. Sinuses/Orbits: No sinus fluid levels or advanced mucosal thickening. No mastoid effusion. Normal orbits. IMPRESSION: Normal head CT. Electronically Signed   By: Ulyses Jarred M.D.   On: 11/13/2017 20:01   Dg Chest Portable 1 View  Result Date: 11/13/2017 CLINICAL DATA:  Endotracheal tube placement EXAM: PORTABLE CHEST 1 VIEW COMPARISON:  11/13/2017 FINDINGS: Endotracheal tube was placed with tip measuring 2.9 cm above the carina. Shallow inspiration with linear atelectasis in the lung bases. Heart size and pulmonary vascularity are normal. No consolidation or airspace disease in the lungs. No blunting of costophrenic angles. No pneumothorax. IMPRESSION: Endotracheal tube appears in satisfactory position. Shallow inspiration with atelectasis in the lung bases. Electronically Signed   By: Lucienne Capers M.D.   On: 11/13/2017 20:58   Dg Chest Portable 1 View  Result Date: 11/13/2017 CLINICAL DATA:  67 year old female with a history altered mental status EXAM: PORTABLE CHEST 1 VIEW COMPARISON:  07/19/2017 FINDINGS: Low lung volumes accentuating the interstitium. Cardiomediastinal silhouette likely unchanged in size and contour. No pneumothorax or pleural effusion. No new confluent airspace disease. Linear/reticular opacity at the left lung base, likely atelectasis. No displaced fracture. IMPRESSION: Low lung volumes without evidence of superimposed acute cardiopulmonary disease. Electronically Signed   By: Corrie Mckusick D.O.   On: 11/13/2017 19:52     EKG:   Orders placed or performed during the hospital encounter of 11/13/17  . EKG 12-Lead  . EKG 12-Lead  . ED EKG  . ED EKG    IMPRESSION AND PLAN:  Principal Problem:   Acute respiratory failure with hypercapnia ([place]) -patient required intubation, she is currently mechanically ventilated, will admit her to the ICU.  Strongly suspect her respiratory failure was due to overdose of medication.  Patient's medications are here and we will proceed with a pill count Active Problems:   Depression, major, recurrent ([place]) -holding medications at this time, patient is intubated and sedated  as above   Generalized anxiety disorder -holding home meds at this time as above   GERD (gastroesophageal reflux disease) -H2 blocker   All the records are reviewed and case discussed with ED provider. Management plans discussed with the patient and/or family.  DVT PROPHYLAXIS: SubQ lovenox  GI PROPHYLAXIS: H2 blocker  ADMISSION STATUS: Inpatient  CODE STATUS: Full Code Status History    Date Active Date Inactive Code Status Order ID Comments User Context   12/17/2016 11:46 12/18/2016 18:05 Full Code [id number]  Defrancesco, Alanda Slim, MD Inpatient    Prior  TOTAL CRITICAL CARE TIME TAKING CARE OF THIS PATIENT: 50 minutes.   Jannifer Franklin, Gen [place] FIELDING 11/13/2017, 11:49 PM  [facility]  Office  [phone]  CC: Primary care physician; Mikey College, NP  Note:  This document was prepared using Dragon voice recognition software and may include unintentional dictation errors.

## 2017-11-13 NOTE — ED Provider Notes (Signed)
[facility] Emergency Department Provider Note  Time seen: 7:51 PM  I have reviewed the triage vital signs and the nursing notes.   HISTORY  Chief Complaint Drug Overdose    HPI Kim Buchanan is a 67 y.o. female with a past medical history of arthritis, chronic back pain, hyperlipidemia, panic attacks, PTSD, muscle spasm presents to the emergency department for altered mental status and unresponsiveness.  According to the husband today the patient has been acting more confused than normal, having difficulty walking being unstable with her gait.  He states it seemed to progressively worsen throughout the day got to the point where he could not help the patient ambulate and she was acting very very sleepy so he called EMS.  He states the patient did recently fill a prescription of baclofen approximately 5 days ago he is not sure how many of these she has taken.  States she had one similar episode to this in her past years ago when she accidentally took too much medication.  Husband states she has no access to pain medication, there is no pain medication prescribed to her and none in the house.  Currently the patient is somnolent, will awaken to strong verbal or light physical stimuli but then quickly falls back asleep.  Is not able to accurately answer questions or stay awake long enough to perform much of the physical exam, such as neurologic testing.   Past Medical History:  Diagnosis Date  . Anxiety   . Arthritis    lower back  . Depression   . GERD (gastroesophageal reflux disease)   . Hyperlipidemia   . Leaky heart valve    sees Dr Humphrey Rolls  . Panic attack   . PTSD (post-traumatic stress disorder)   . Rectal fistula   . Sleep apnea   . Wears dentures    full upper    Patient Active Problem List   Diagnosis Date Noted  . Psychophysiological insomnia 11/06/2017  . Generalized anxiety disorder 10/20/2017  . Hyperlipidemia 07/19/2017  . S/P vaginal  hysterectomy 12/17/2016  . Vaginal atrophy 10/31/2016  . Hx of colonic polyps   . Benign neoplasm of transverse colon   . Benign neoplasm of descending colon   . Polyp of sigmoid colon   . Difficulty urinating 08/31/2016  . DDD (degenerative disc disease), lumbar 02/21/2016  . GERD (gastroesophageal reflux disease) 02/14/2016  . Low back pain 02/14/2016  . Prolapsed external hemorrhoids 06/06/2015  . Anxiety and depression 06/06/2015  . Depression, major, recurrent ([place]) 06/06/2015  . Acid reflux 06/06/2015  . Panic attack 06/06/2015  . Compulsive tobacco user syndrome 06/06/2015    Past Surgical History:  Procedure Laterality Date  . ABDOMINAL HYSTERECTOMY  10/2015  . COLONOSCOPY  2014  . COLONOSCOPY WITH PROPOFOL N/A 10/01/2016   Procedure: COLONOSCOPY WITH PROPOFOL;  Surgeon: Lucilla Lame, MD;  Location: [place];  Service: Endoscopy;  Laterality: N/A;  . CYSTOCELE REPAIR N/A 12/17/2016   Procedure: ANTERIOR REPAIR (CYSTOCELE);  Surgeon: Brayton Mars, MD;  Location: [facility];  Service: Gynecology;  Laterality: N/A;  . POLYPECTOMY  10/01/2016   Procedure: POLYPECTOMY;  Surgeon: Lucilla Lame, MD;  Location: [place];  Service: Endoscopy;;  . RECTAL SURGERY  06/28/2015   Rectal prolapse, laparoscopic rectopexy the coldCharmont, MD; [facility]  . TUBAL LIGATION    . VAGINAL HYSTERECTOMY Bilateral 12/17/2016   Procedure: HYSTERECTOMY VAGINAL WITH BILATERAL SALPINGO OOPHERECTOMY;  Surgeon: Brayton Mars, MD;  Location: [facility];  Service: Gynecology;  Laterality: Bilateral;    Prior to Admission medications   Medication Sig Start Date End Date Taking? Authorizing Provider  amitriptyline (ELAVIL) 10 MG tablet Take 1 tablet (10 mg total) by mouth at bedtime. 11/06/17   Mikey College, NP  aspirin 81 MG tablet Take 81 mg by mouth daily.     [provider]  atorvastatin (LIPITOR) 20 MG tablet Take 1 tablet (20 mg total) by mouth at bedtime.  11/06/17   Mikey College, NP  baclofen (LIORESAL) 10 MG tablet Take 1 tablet (10 mg total) by mouth 4 (four) times daily. 11/06/17   Mikey College, NP  clonazePAM (KLONOPIN) 0.5 MG tablet Take 1 tablet (0.5mg ) during day for panic attack once as needed.  Take 3 tablets (1.5mg ) at night for sleep. 11/06/17   Mikey College, NP  docusate sodium (COLACE) 100 MG capsule Take 1 capsule (100 mg total) by mouth 2 (two) times daily. Patient not taking: Reported on 11/06/2017 12/18/16   Defrancesco, Alanda Slim, MD  ibuprofen (ADVIL,MOTRIN) 800 MG tablet Take 1 tablet (800 mg total) by mouth 3 (three) times daily. 12/18/16   Defrancesco, Alanda Slim, MD  isosorbide dinitrate (ISORDIL) 30 MG tablet Take 1 tablet (30 mg total) by mouth daily. 11/06/17   Mikey College, NP  omeprazole (PRILOSEC) 40 MG capsule Take 1 capsule (40 mg total) by mouth daily. 11/06/17   Mikey College, NP  polyethylene glycol [facility] / Floria Raveling) packet Take 17 g by mouth daily as needed.     [provider]  senna (SENOKOT) 8.6 MG TABS tablet Take 2 tablets by mouth daily as needed for mild constipation.    [provider]  venlafaxine XR (EFFEXOR-XR) 150 MG 24 hr capsule Take 1 capsule (150 mg total) by mouth at bedtime. 11/06/17   Mikey College, NP  venlafaxine XR (EFFEXOR-XR) 75 MG 24 hr capsule Take 1 capsule (75 mg total) by mouth at bedtime. 11/06/17   Mikey College, NP    No Known Allergies  Family History  Problem Relation Age of Onset  . Diabetes Mother   . Diabetes Sister   . Diabetes Brother   . Lymphoma Brother   . Heart disease Father   . Diabetes Sister   . Diabetes Brother   . Lymphoma Brother   . Heart disease Brother   . Healthy Sister   . Breast cancer Neg Hx   . Ovarian cancer Neg Hx   . Colon cancer Neg Hx     Social History Social History   Tobacco Use  . Smoking status: Current Every Day Smoker    Packs/day: 0.50    Years: 45.00     Pack years: 22.50    Types: Cigarettes  . Smokeless tobacco: Never Used  . Tobacco comment: started at age 17  Substance Use Topics  . Alcohol use: No    Alcohol/week: 0.0 oz  . Drug use: No    Review of Systems Unable to obtain an adequate review of systems due to altered mental status/somnolence  ____________________________________________   PHYSICAL EXAM:  VITAL SIGNS: ED Triage Vitals  Enc Vitals Group     BP 11/13/17 1847 (!) 102/56     Pulse Rate 11/13/17 1847 82     Resp 11/13/17 1847 15     Temp 11/13/17 1847 98.7 F (37.1 C)     Temp Source 11/13/17 1847 Oral     SpO2 11/13/17 1847 (!) 89 %  Weight 11/13/17 1852 160 lb (72.6 kg)     Height 11/13/17 1852 5\' 2"  (1.575 m)     Head Circumference --      Peak Flow --      Pain Score --      Pain Loc --      Pain Edu? --      Excl. in [place]? --     Constitutional: Somnolent, sleeping, awakens to strong verbal or light physical stimuli but then falls back asleep.  Unable to keep awake with continued physical stimuli. Eyes: 1 cm pupils bilaterally ENT   Head: Normocephalic and atraumatic.   Nose: No congestion/rhinnorhea.   Mouth/Throat: Dry mucous membranes Cardiovascular: Normal rate, regular rhythm.  Respiratory: Normal respiratory effort without tachypnea nor retractions. Breath sounds are clear.  Respiratory rate appears to be between 10 and 15 contrary to documented vitals. Gastrointestinal: Soft and nontender. No distention.   Musculoskeletal: Nontender with normal range of motion in all extremities. Neurologic: Patient speech appears to be slurred and then she falls back asleep.  Appears to move all extremities at times. Skin:  Skin is warm, dry and intact.  Psychiatric: Somnolent ____________________________________________    EKG  EKG reviewed and interpreted by myself shows sinus rhythm 82 bpm with a widened QRS, normal axis, largely normal intervals with nonspecific ST changes.  No ST  elevations.  ____________________________________________    RADIOLOGY  CT head negative X-ray negative  ____________________________________________   INITIAL IMPRESSION / ASSESSMENT AND PLAN / ED COURSE  Pertinent labs & imaging results that were available during my care of the patient were reviewed by me and considered in my medical decision making (see chart for details).  Patient presents to the emergency department for altered mental status.  Husband states that has progressively worsened throughout the day, this evening she has become very somnolent and difficult to arouse.  Differential would include metabolic or left light abnormality, infectious etiology, medication overdose.  Husband denies any access to pain medication although the patient does have a very small pupils currently.  We will dose Narcan 1 mg.  The patient's lab work has resulted largely within normal limits, urinalysis and urine drug screen pending.  We will obtain a CT scan of the head and chest x-ray as a precaution and continue to closely monitor.  Patient is adequately protecting her airway currently however we will continue to closely monitor.  Patient's labs have so far resulted largely within normal limits troponin normal.  Patient's respiratory rate has continued to decline now it is between 6 and 8 breaths/min.  Patient has become increasingly altered, now nonresponsive to verbal stimuli but does awaken to moderate physical stimuli such as sternal rub.  We will dose an additional 2 mg of Narcan given possible mild response to the first dose although no significant response.  Continues to have pinpoint pupils bilaterally.  Given the history of progressive worsening throughout the day now to be coming very nonresponsive with decreased respiratory rate if the patient is unresponsive to the additional 2 mg of Narcan we will likely proceed with intubation for airway protection and continued medical workup with  admission to the hospital.  Patient's blood pressure currently 99/69 receiving 1 L of IV fluid bolus.  Could still be a possible medication reaction or accidental overdose, but unlikely opioid related.  INTUBATION Performed by: Harvest Dark  Required items: required blood products, implants, devices, and special equipment available Patient identity confirmed: provided demographic data and hospital-assigned identification number  Time out: Immediately prior to procedure a "time out" was called to verify the correct patient, procedure, equipment, support staff and site/side marked as required.  Indications: Respiratory failure, airway protection  Intubation method: 4.0 Glidescope Laryngoscopy   Preoxygenation: 100% BVM  Sedatives: 10 mg etomidate Paralytic: 100 mg succinylcholine  Tube Size: 7.5 cuffed  Post-procedure assessment: chest rise and ETCO2 monitor Breath sounds: equal and absent over the epigastrium Tube secured with: ETT holder Chest x-ray interpreted by radiologist and me.  Patient tolerated the procedure well with no immediate complications.   Patient intubated with one attempt, no complications.     CRITICAL CARE Performed by: Harvest Dark   Total critical care time: 30 minutes  Critical care time was exclusive of separately billable procedures and treating other patients.  Critical care was necessary to treat or prevent imminent or life-threatening deterioration.  Critical care was time spent personally by me on the following activities: development of treatment plan with patient and/or surrogate as well as nursing, discussions with consultants, evaluation of patient's response to treatment, examination of patient, obtaining history from patient or surrogate, ordering and performing treatments and interventions, ordering and review of laboratory studies, ordering and review of radiographic studies, pulse oximetry and re-evaluation of patient's  condition.   ____________________________________________   FINAL CLINICAL IMPRESSION(S) / ED DIAGNOSES  Altered mental status Decreased respiratory rate Respiratory failure   Harvest Dark, MD 11/13/17 2048

## 2017-11-14 ENCOUNTER — Inpatient Hospital Stay: Payer: Medicare Other

## 2017-11-14 ENCOUNTER — Encounter: Payer: Medicare Other | Admitting: Obstetrics and Gynecology

## 2017-11-14 ENCOUNTER — Other Ambulatory Visit: Payer: Self-pay

## 2017-11-14 LAB — CBC
HCT: 40 % (ref 35.0–47.0)
Hemoglobin: 13.5 g/dL (ref 12.0–16.0)
MCH: 30.6 pg (ref 26.0–34.0)
MCHC: 33.6 g/dL (ref 32.0–36.0)
MCV: 91 fL (ref 80.0–100.0)
Platelets: 230 10*3/uL (ref 150–440)
RBC: 4.4 MIL/uL (ref 3.80–5.20)
RDW: 12.9 % (ref 11.5–14.5)
WBC: 10.1 10*3/uL (ref 3.6–11.0)

## 2017-11-14 LAB — BLOOD GAS, ARTERIAL
Acid-Base Excess: 0.4 mmol/L (ref 0.0–2.0)
Bicarbonate: 22.6 mmol/L (ref 20.0–28.0)
FIO2: 0.4
MECHVT: 500 mL
O2 Saturation: 99.6 %
PEEP: 5 cmH2O
Patient temperature: 37
RATE: 24 resp/min
pCO2 arterial: 29 mmHg — ABNORMAL LOW (ref 32.0–48.0)
pH, Arterial: 7.5 — ABNORMAL HIGH (ref 7.350–7.450)
pO2, Arterial: 160 mmHg — ABNORMAL HIGH (ref 83.0–108.0)

## 2017-11-14 LAB — BASIC METABOLIC PANEL
Anion gap: 7 (ref 5–15)
BUN: 17 mg/dL (ref 6–20)
CO2: 22 mmol/L (ref 22–32)
Calcium: 8.1 mg/dL — ABNORMAL LOW (ref 8.9–10.3)
Chloride: 113 mmol/L — ABNORMAL HIGH (ref 101–111)
Creatinine, Ser: 0.81 mg/dL (ref 0.44–1.00)
GFR calc Af Amer: >60 mL/min (ref >60)
GFR calc non Af Amer: >60 mL/min (ref >60)
Glucose, Bld: 111 mg/dL — ABNORMAL HIGH (ref 65–99)
Potassium: 3.6 mmol/L (ref 3.5–5.1)
Sodium: 142 mmol/L (ref 135–145)

## 2017-11-14 LAB — GLUCOSE, CAPILLARY: Glucose-Capillary: 93 mg/dL (ref 65–99)

## 2017-11-14 LAB — MRSA PCR SCREENING: MRSA by PCR: NEGATIVE

## 2017-11-14 LAB — TRIGLYCERIDES: Triglycerides: 162 mg/dL — ABNORMAL HIGH (ref <150)

## 2017-11-14 MED ORDER — FAMOTIDINE IN NACL 20-0.9 MG/50ML-% IV SOLN
20.0000 mg | Freq: Two times a day (BID) | INTRAVENOUS | Status: DC
  Administered 2017-11-14 – 2017-11-17 (×9): 20 mg via INTRAVENOUS
  Filled 2017-11-14 (×9): qty 50

## 2017-11-14 MED ORDER — CHLORHEXIDINE GLUCONATE 0.12% ORAL RINSE (MEDLINE KIT)
15.0000 mL | Freq: Two times a day (BID) | OROMUCOSAL | Status: DC
  Administered 2017-11-14 – 2017-11-17 (×6): 15 mL via OROMUCOSAL

## 2017-11-14 MED ORDER — ENOXAPARIN SODIUM 40 MG/0.4ML ~~LOC~~ SOLN
40.0000 mg | SUBCUTANEOUS | Status: DC
  Administered 2017-11-14 – 2017-11-21 (×8): 40 mg via SUBCUTANEOUS
  Filled 2017-11-14 (×8): qty 0.4

## 2017-11-14 MED ORDER — NOREPINEPHRINE BITARTRATE 1 MG/ML IV SOLN
0.0000 ug/min | INTRAVENOUS | Status: DC
  Administered 2017-11-14: 5 ug/min via INTRAVENOUS
  Filled 2017-11-14: qty 4

## 2017-11-14 MED ORDER — SODIUM CHLORIDE 0.9 % IV SOLN: 250.0000 mL | INTRAVENOUS | Status: DC | PRN

## 2017-11-14 MED ORDER — ONDANSETRON HCL 4 MG/2ML IJ SOLN: 4.0000 mg | Freq: Four times a day (QID) | INTRAMUSCULAR | Status: DC | PRN

## 2017-11-14 MED ORDER — ORAL CARE MOUTH RINSE
15.0000 mL | Freq: Four times a day (QID) | OROMUCOSAL | Status: DC
  Administered 2017-11-14 (×4): 15 mL via OROMUCOSAL

## 2017-11-14 MED ORDER — ACETAMINOPHEN 325 MG PO TABS: 650.0000 mg | ORAL_TABLET | ORAL | Status: DC | PRN

## 2017-11-14 MED ORDER — ASPIRIN 81 MG PO CHEW
324.0000 mg | CHEWABLE_TABLET | ORAL | Status: AC
  Administered 2017-11-14: 324 mg via ORAL
  Filled 2017-11-14: qty 4

## 2017-11-14 MED ORDER — ORAL CARE MOUTH RINSE
15.0000 mL | OROMUCOSAL | Status: DC
  Administered 2017-11-14 – 2017-11-17 (×26): 15 mL via OROMUCOSAL

## 2017-11-14 MED ORDER — ASPIRIN 300 MG RE SUPP: 300.0000 mg | RECTAL | Status: AC

## 2017-11-14 MED ORDER — PROPOFOL 1000 MG/100ML IV EMUL
5.0000 ug/kg/min | INTRAVENOUS | Status: DC
  Administered 2017-11-14: 30 ug/kg/min via INTRAVENOUS
  Administered 2017-11-15: 5 ug/kg/min via INTRAVENOUS
  Administered 2017-11-16: 50 ug/kg/min via INTRAVENOUS
  Administered 2017-11-16: 20 ug/kg/min via INTRAVENOUS
  Administered 2017-11-16: 50 ug/kg/min via INTRAVENOUS
  Administered 2017-11-16: 30 ug/kg/min via INTRAVENOUS
  Administered 2017-11-17 (×2): 50 ug/kg/min via INTRAVENOUS
  Filled 2017-11-14 (×8): qty 100

## 2017-11-14 NOTE — Assessment & Plan Note (Signed)
Pt with severe anxiety with panic disorder and depression managed on venlafaxine and clonazepam.  Pt reports she has been unable to reduce clonazepam dose, but only used lower doses for 2 days.  Reports insomnia when not taking her medications.  Plan: 1. Continue Effexor 225 mg once daily 2. Reduce clonazepam.  Take 0.5 mg tablets to assist with dose reduction.  Pt was previously taking 1 mg during day and 2 mg at bedtime.  Will reduce to 0.5 mg daily for panic attack and 1.5 mg at bedtime for sleep.  Goal is completely stopping clonazepam and transitioning to alternative agent. 3. Encouraged sleep hygiene practices of routine at bedtime. 4. Will trial amitriptyline 10 mg at bedtime for sleep, depression/anxiety and possible benefit for pain control.  Low dose.  Cautioned pt about drowsiness, dizziness.  Should call clinic if having any additional sedation or daytime sleepiness. 5. Pt needs to transition her psychiatric care to [facility] [place].  Appointment pending. 6. Followup 4 weeks.

## 2017-11-14 NOTE — Assessment & Plan Note (Signed)
Pt requests refills of omeprazole.  Refill provided as she reports no heartburn and no breakthrough symptoms.

## 2017-11-14 NOTE — Assessment & Plan Note (Signed)
See Anxiety/Depression

## 2017-11-14 NOTE — Assessment & Plan Note (Signed)
Pt requests refills of her isordil as she reports she will run out before Cardiology.  One refill provided.  Pt to contact their clinic for additional refills.

## 2017-11-14 NOTE — Progress Notes (Signed)
Initial Nutrition Assessment  DOCUMENTATION CODES:   Not applicable  INTERVENTION:  If patient expected to remain intubated greater than 24-48 hours, recommend initiating Vital High Protein at 50 mL/hr via OGT. Provides 1200 kcal, 105 grams of protein, 1008 mL H2O. With propofol provides 1551 kcal (99% estimated needs).  If tube feeds are initiated, also recommend providing liquid MVI daily per tube.  If tube feeds are initiated, recommend providing bowel regimen. May also need to consider use of Reglan with tube feeds as abdominal x-ray findings suggest gastroparesis or decreased motility (no hx of DM).  NUTRITION DIAGNOSIS:   Inadequate oral intake related to inability to eat as evidenced by NPO status.  GOAL:   Provide needs based on ASPEN/SCCM guidelines  MONITOR:   Vent status, Labs, Weight trends, TF tolerance, I & O's  REASON FOR ASSESSMENT:   Ventilator    ASSESSMENT:   67 year old female with PMHx of anxiety, depression, PTSD, GERD, HLD, arthritis, rectal fistula, sleep apnea admitted with acute encephalopathy likely secondary to intentional vs accidental drug overdose with baclofen requiring emergent intubation 1/23.   -Noted on abdominal x-ray patient had gas and ingested material in the stomach and a stool-filled colon. No small or large bowel distention.  Per chart patient was obtunded on admission. Patient intubated. Off sedation at this time. Husband resting at bedside. He reports patient was eating well PTA. Per chart she has lost 6 lbs (3.6% body weight) over the past 5 months, which is not significant for time frame.  Access: 18 Fr. OGT placed 1/24; terminates in body of stomach per abdominal x-ray 1/24; 55 cm at corner of mouth  MAP: 66-103 mmHg; off pressors  Patient is currently intubated on ventilator support MV: 9 L/min Temp (24hrs), Avg:98.4 F (36.9 C), Min:95.7 F (35.4 C), Max:99.9 F (37.7 C)  Propofol: was at 13.3 ml/hr (351 kcal  daily)  Medications reviewed and include: famotidine, propofol gtt. Off norepinephrine gtt (was only on 5 mcg/min at max).  Labs reviewed: CBG 93, Chloride 113, Triglycerides 162.  Patient does not meet criteria for malnutrition.  Discussed with RN and on rounds. Plan is for SBT and possible extubation today.  NUTRITION - FOCUSED PHYSICAL EXAM:    Most Recent Value  Orbital Region  No depletion  Upper Arm Region  No depletion  Thoracic and Lumbar Region  No depletion  Buccal Region  Unable to assess  Temple Region  No depletion  Clavicle Bone Region  No depletion  Clavicle and Acromion Bone Region  No depletion  Scapular Bone Region  Unable to assess  Dorsal Hand  No depletion  Patellar Region  No depletion  Anterior Thigh Region  No depletion  Posterior Calf Region  No depletion  Edema (RD Assessment)  None  Hair  Reviewed  Eyes  Unable to assess  Mouth  Unable to assess  Skin  Reviewed  Nails  Reviewed     Diet Order:  Diet NPO time specified Seizure precautions  EDUCATION NEEDS:   No education needs have been identified at this time  Skin:  Skin Assessment: Reviewed RN Assessment  Last BM:  Unknown  Height:   Ht Readings from Last 1 Encounters:  11/14/17 5\' 4"  (1.626 m)    Weight:   Wt Readings from Last 1 Encounters:  11/14/17 162 lb 7.7 oz (73.7 kg)    Ideal Body Weight:  54.5 kg  BMI:  Body mass index is 27.89 kg/m.  Estimated Nutritional Needs:  Kcal:  9458 (PSU 2003b w/ MSJ 1277, Ve 9, Tmax 37.6)  Protein:  90-110 grams (1.2-1.5 grams/kg)  Fluid:  1.6-1.9 L/day (30-35 mL/kg IBW)  Willey Blade, MS, RD, LDN Office: [phone] Pager: [phone] After Hours/Weekend Pager: [phone]

## 2017-11-14 NOTE — Progress Notes (Signed)
Pharmacy note: CCM antibiotic monitoring Patient is not currently on any antibiotics that require dose adjustment for renal function. Pharmacy will continue to follow and manage as indicated.

## 2017-11-14 NOTE — ED Notes (Signed)
Pt husband took pt belongings and medications

## 2017-11-14 NOTE — Progress Notes (Signed)
   11/14/17 1330  Clinical Encounter Type  Visited With Other (Comment) (patient/visitor sleeping; to check in again)  Visit Type Initial   Chaplain attempted introductory visit.  Patient and visitor both asleep.

## 2017-11-14 NOTE — Progress Notes (Signed)
[place] at [place] NAME: Kim Buchanan    MR#:  [id number]  DATE OF BIRTH:  06-09-1951  SUBJECTIVE:  CHIEF COMPLAINT:   Chief Complaint  Patient presents with  . Drug Overdose  . Altered Mental Status  awake and following commands, intubated REVIEW OF SYSTEMS:  Review of Systems  Unable to perform ROS: Intubated    DRUG ALLERGIES:  No Known Allergies VITALS:  Blood pressure 122/67, pulse 86, temperature (!) 100.4 F (38 C), resp. rate 18, height 5\' 4"  (1.626 m), weight 73.7 kg (162 lb 7.7 oz), SpO2 99 %. PHYSICAL EXAMINATION:  Physical Exam  Constitutional: She is oriented to person, place, and time and well-developed, well-nourished, and in no distress.  HENT:  Head: Normocephalic and atraumatic.  ETT in place  Eyes: Conjunctivae and EOM are normal. Pupils are equal, round, and reactive to light.  Neck: Normal range of motion. Neck supple. No tracheal deviation present. No thyromegaly present.  Cardiovascular: Normal rate, regular rhythm and normal heart sounds.  Pulmonary/Chest: Effort normal and breath sounds normal. No respiratory distress. She has no wheezes. She exhibits no tenderness.  Abdominal: Soft. Bowel sounds are normal. She exhibits no distension. There is no tenderness.  Musculoskeletal: Normal range of motion.  Neurological: She is alert and oriented to person, place, and time. No cranial nerve deficit.  Skin: Skin is warm and dry. No rash noted.  Psychiatric: Mood and affect normal.   LABORATORY PANEL:  Female CBC Recent Labs  Lab 11/14/17 0605  WBC 10.1  HGB 13.5  HCT 40.0  PLT 230   ------------------------------------------------------------------------------------------------------------------ Chemistries  Recent Labs  Lab 11/13/17 1853 11/14/17 0605  NA 142 142  K 3.9 3.6  CL 110 113*  CO2 25 22  GLUCOSE 94 111*  BUN 21* 17  CREATININE 0.87 0.81  CALCIUM 8.3* 8.1*  AST 19  --   ALT 18   --   ALKPHOS 94  --   BILITOT 0.5  --    RADIOLOGY:  Ct Head Wo Contrast  Result Date: 11/13/2017 CLINICAL DATA:  Altered mental status EXAM: CT HEAD WITHOUT CONTRAST TECHNIQUE: Contiguous axial images were obtained from the base of the skull through the vertex without intravenous contrast. COMPARISON:  His head CT 12/19/2011 FINDINGS: Brain: No mass lesion, intraparenchymal hemorrhage or extra-axial collection. No evidence of acute cortical infarct. Brain parenchyma and CSF-containing spaces are normal for age. Vascular: No hyperdense vessel or unexpected calcification. Skull: Normal visualized skull base, calvarium and extracranial soft tissues. Sinuses/Orbits: No sinus fluid levels or advanced mucosal thickening. No mastoid effusion. Normal orbits. IMPRESSION: Normal head CT. Electronically Signed   By: Ulyses Jarred M.D.   On: 11/13/2017 20:01   Dg Chest Portable 1 View  Result Date: 11/13/2017 CLINICAL DATA:  Endotracheal tube placement EXAM: PORTABLE CHEST 1 VIEW COMPARISON:  11/13/2017 FINDINGS: Endotracheal tube was placed with tip measuring 2.9 cm above the carina. Shallow inspiration with linear atelectasis in the lung bases. Heart size and pulmonary vascularity are normal. No consolidation or airspace disease in the lungs. No blunting of costophrenic angles. No pneumothorax. IMPRESSION: Endotracheal tube appears in satisfactory position. Shallow inspiration with atelectasis in the lung bases. Electronically Signed   By: Lucienne Capers M.D.   On: 11/13/2017 20:58   Dg Chest Portable 1 View  Result Date: 11/13/2017 CLINICAL DATA:  67 year old female with a history altered mental status EXAM: PORTABLE CHEST 1 VIEW COMPARISON:  07/19/2017 FINDINGS: Low lung  volumes accentuating the interstitium. Cardiomediastinal silhouette likely unchanged in size and contour. No pneumothorax or pleural effusion. No new confluent airspace disease. Linear/reticular opacity at the left lung base, likely  atelectasis. No displaced fracture. IMPRESSION: Low lung volumes without evidence of superimposed acute cardiopulmonary disease. Electronically Signed   By: Corrie Mckusick D.O.   On: 11/13/2017 19:52   Dg Abd Portable 1 View  Result Date: 11/14/2017 CLINICAL DATA:  OG tube placement EXAM: PORTABLE ABDOMEN - 1 VIEW COMPARISON:  CT abdomen and pelvis 05/01/2016 FINDINGS: Enteric tube tip is in the left upper quadrant consistent with location in the body of the stomach. Gas and ingested material is demonstrated in the stomach. Changes could reflect gastroparesis or decreased motility. Stool-filled colon. No small or large bowel distention. No radiopaque stones. Postoperative changes in the pelvis. Degenerative changes in the spine. IMPRESSION: Enteric tube tip is in the left upper quadrant consistent with location in the body of the stomach. Gas and ingested material filled the stomach possibly indicating decreased motility. Electronically Signed   By: Lucienne Capers M.D.   On: 11/14/2017 00:51   ASSESSMENT AND PLAN:   Acute respiratory failure with hypercapnia (HCC) -intubated on vent - awake off sedation, vent mgmt per PCCM Depression, major, recurrent ([place]) -holding medications at this time, patient is intubated    Generalized anxiety disorder -holding home meds at this time as above   GERD (gastroesophageal reflux disease) -H2 blocker       All the records are reviewed and case discussed with Care Management/Social Worker. Management plans discussed with the patient, nursing and they are in agreement.  CODE STATUS: Full Code  TOTAL TIME TAKING CARE OF THIS PATIENT: 15 minutes.   More than 50% of the time was spent in counseling/coordination of care: YES  POSSIBLE D/C IN 2-3 DAYS, DEPENDING ON CLINICAL CONDITION.   Max Sane M.D on 11/14/2017 at 6:21 PM  Between 7am to 6pm - Pager - [phone]  After 6pm go to [email or web address] - password [email or web address]  [employer] [place]  Hospitalists  Office  [phone]  CC: Primary care physician; Mikey College, NP  Note: This dictation was prepared with Dragon dictation along with smaller phrase technology. Any transcriptional errors that result from this process are unintentional.

## 2017-11-14 NOTE — Assessment & Plan Note (Signed)
Pt has long history of chronic back pain and prior interventional pain management treatment.  Pt declines neurosurgery options as well.  Has previously been managed only w/ opioid pain medication, but has now been off for several weeks.  Pt w/ hypertonicity of paraspinal muscles. Reports increased ability to perform ADLs and iADLs since starting baclofen, but reports taking 20 mg twice daily most days and will run out of medication.  Pt did not call clinic for this dose increase.  Plan: 1. Discussed no chronic opioids from this clinic.  Pt verbalizes understanding. 2. May only take total of 40 mg baclofen daily.  Cautioned pt on side effects at high doses.  May increase to 10 mg qid since is helping with pain for ADLs. 3. Encouraged daily stretching as tolerated and consideration of PT. 4. Followup 4 weeks.

## 2017-11-15 ENCOUNTER — Inpatient Hospital Stay: Payer: Medicare Other

## 2017-11-15 ENCOUNTER — Encounter: Payer: Medicare Other | Admitting: Obstetrics and Gynecology

## 2017-11-15 DIAGNOSIS — R401 Stupor: Secondary | ICD-10-CM

## 2017-11-15 LAB — GLUCOSE, CAPILLARY: Glucose-Capillary: 182 mg/dL — ABNORMAL HIGH (ref 65–99)

## 2017-11-15 LAB — MAGNESIUM: Magnesium: 2.2 mg/dL (ref 1.7–2.4)

## 2017-11-15 LAB — PHOSPHORUS: Phosphorus: 4.6 mg/dL (ref 2.5–4.6)

## 2017-11-15 MED ORDER — LORAZEPAM 1 MG PO TABS: 2.0000 mg | ORAL_TABLET | Freq: Once | ORAL | Status: DC

## 2017-11-15 MED ORDER — DEXMEDETOMIDINE HCL IN NACL 400 MCG/100ML IV SOLN
0.4000 ug/kg/h | INTRAVENOUS | Status: DC
  Administered 2017-11-15: 1.2 ug/kg/h via INTRAVENOUS
  Administered 2017-11-15: 0.6 ug/kg/h via INTRAVENOUS
  Administered 2017-11-17: 0.4 ug/kg/h via INTRAVENOUS
  Filled 2017-11-15 (×4): qty 100

## 2017-11-15 MED ORDER — PRO-STAT SUGAR FREE PO LIQD
30.0000 mL | Freq: Every day | ORAL | Status: DC
  Administered 2017-11-15 – 2017-11-16 (×2): 30 mL

## 2017-11-15 MED ORDER — VITAL HIGH PROTEIN PO LIQD: 1000.0000 mL | ORAL | Status: DC

## 2017-11-15 MED ORDER — LORAZEPAM 2 MG/ML IJ SOLN
INTRAMUSCULAR | Status: AC
  Administered 2017-11-15: 2 mg via INTRAVENOUS
  Filled 2017-11-15: qty 1

## 2017-11-15 MED ORDER — LORAZEPAM 2 MG/ML IJ SOLN
2.0000 mg | Freq: Once | INTRAMUSCULAR | Status: DC
  Filled 2017-11-15: qty 1

## 2017-11-15 MED ORDER — METOCLOPRAMIDE HCL 5 MG/ML IJ SOLN
5.0000 mg | Freq: Three times a day (TID) | INTRAMUSCULAR | Status: DC
  Administered 2017-11-15 – 2017-11-18 (×9): 5 mg via INTRAVENOUS
  Filled 2017-11-15 (×9): qty 2

## 2017-11-15 MED ORDER — LORAZEPAM 2 MG/ML IJ SOLN
2.0000 mg | INTRAMUSCULAR | Status: AC
  Administered 2017-11-15: 2 mg via INTRAVENOUS

## 2017-11-15 MED ORDER — DEXAMETHASONE SODIUM PHOSPHATE 4 MG/ML IJ SOLN
4.0000 mg | Freq: Four times a day (QID) | INTRAMUSCULAR | Status: DC
  Administered 2017-11-15 – 2017-11-17 (×11): 4 mg via INTRAVENOUS
  Filled 2017-11-15 (×11): qty 1

## 2017-11-15 MED ORDER — VITAL AF 1.2 CAL PO LIQD
1000.0000 mL | ORAL | Status: DC
  Administered 2017-11-15: 1000 mL

## 2017-11-15 MED ORDER — POLYETHYLENE GLYCOL 3350 17 G PO PACK
17.0000 g | PACK | Freq: Every day | ORAL | Status: DC
  Administered 2017-11-15 – 2017-11-22 (×7): 17 g
  Filled 2017-11-15 (×9): qty 1

## 2017-11-15 MED ORDER — FENTANYL CITRATE (PF) 100 MCG/2ML IJ SOLN
INTRAMUSCULAR | Status: AC
  Administered 2017-11-15: 13:00:00
  Filled 2017-11-15: qty 2

## 2017-11-15 MED ORDER — FENTANYL CITRATE (PF) 100 MCG/2ML IJ SOLN: 100.0000 ug | Freq: Once | INTRAMUSCULAR | Status: DC

## 2017-11-15 NOTE — Progress Notes (Signed)
[place] at [place] NAME: Kim Buchanan    MR#:  [id number]  DATE OF BIRTH:  October 10, 1951  SUBJECTIVE:  CHIEF COMPLAINT:   Chief Complaint  Patient presents with  . Drug Overdose  . Altered Mental Status  same, not able to wean vent, low grade fever REVIEW OF SYSTEMS:  Review of Systems  Unable to perform ROS: Intubated   DRUG ALLERGIES:  No Known Allergies VITALS:  Blood pressure 140/78, pulse 65, temperature (!) 100.8 F (38.2 C), resp. rate 18, height 5\' 4"  (1.626 m), weight 72.3 kg (159 lb 6.3 oz), SpO2 100 %. PHYSICAL EXAMINATION:  Physical Exam  Constitutional: She is oriented to person, place, and time and well-developed, well-nourished, and in no distress.  HENT:  Head: Normocephalic and atraumatic.  ETT in place  Eyes: Conjunctivae and EOM are normal. Pupils are equal, round, and reactive to light.  Neck: Normal range of motion. Neck supple. No tracheal deviation present. No thyromegaly present.  Cardiovascular: Normal rate, regular rhythm and normal heart sounds.  Pulmonary/Chest: Effort normal and breath sounds normal. No respiratory distress. She has no wheezes. She exhibits no tenderness.  Abdominal: Soft. Bowel sounds are normal. She exhibits no distension. There is no tenderness.  Musculoskeletal: Normal range of motion.  Neurological: She is alert and oriented to person, place, and time. No cranial nerve deficit.  Skin: Skin is warm and dry. No rash noted.  Psychiatric: Mood and affect normal.   LABORATORY PANEL:  Female CBC Recent Labs  Lab 11/14/17 0605  WBC 10.1  HGB 13.5  HCT 40.0  PLT 230   ------------------------------------------------------------------------------------------------------------------ Chemistries  Recent Labs  Lab 11/13/17 1853 11/14/17 0605 11/15/17 1618  NA 142 142  --   K 3.9 3.6  --   CL 110 113*  --   CO2 25 22  --   GLUCOSE 94 111*  --   BUN 21* 17  --   CREATININE  0.87 0.81  --   CALCIUM 8.3* 8.1*  --   MG  --   --  2.2  AST 19  --   --   ALT 18  --   --   ALKPHOS 94  --   --   BILITOT 0.5  --   --    RADIOLOGY:  Dg Abd 1 View  Result Date: 11/15/2017 CLINICAL DATA:  OG tube placement. EXAM: ABDOMEN - 1 VIEW COMPARISON:  Single-view of the abdomen 11/14/2017. FINDINGS: OG tube is in place with the tip in the midbody of the stomach. Large colonic stool burden noted. IMPRESSION: OG tube tip is in the stomach. Large colonic stool burden. Electronically Signed   By: Inge Rise M.D.   On: 11/15/2017 12:44   Ct Soft Tissue Neck Wo Contrast  Result Date: 11/15/2017 CLINICAL DATA:  Chronic dyspnea. EXAM: CT NECK WITHOUT CONTRAST TECHNIQUE: Multidetector CT imaging of the neck was performed following the standard protocol without intravenous contrast. COMPARISON:  None. FINDINGS: Pharynx and larynx: The patient is intubated. An NG tube is in place. The cough is below the vocal cords and partially deflated. No definite mucosal or submucosal lesions are present. Salivary glands: Within normal limits. Thyroid: The thyroid is normal. There is edema surrounding the trachea below the vocal cords. Lymph nodes: No significant adenopathy is present. Vascular: No significant vascular lesions are present. Limited intracranial: Within normal limits. Visualized orbits: Globes and orbits are within normal limits as well. Mastoids and visualized  paranasal sinuses: The paranasal sinuses and mastoid air cells are clear. Skeleton: Mild endplate changes and uncovertebral spurring is present at C4-5, C5-6, and C6-7. Vertebral body heights are maintained. Alignment is anatomic. No focal lytic or blastic lesions are present. Upper chest: Lung apices are clear. IMPRESSION: 1. The patient is intubated. Cuff is partially deflated. Mild edema surrounds the upper trachea without a focal mucosal lesion. No mass lesion is present. There is no defect. 2. Mild degenerative changes in the  cervical spine. Electronically Signed   By: San Morelle M.D.   On: 11/15/2017 12:24   ASSESSMENT AND PLAN:  67 yo female admitted with acute encephalopathy likely secondary to accidental vs. unintentional drug overdose requiring mechanical intubation for airway protection  * Acute respiratory failure with hypercapnia (HCC) -remains on vent - vent mgmt per PCCM, swelling/edema around trachea seen on CT, decadron started  * Depression, major, recurrent (HCC) -holding medications at this time, patient is intubated    * Generalized anxiety disorder -holding home meds at this time as above  * GERD (gastroesophageal reflux disease) - IV pepcid       All the records are reviewed and case discussed with Care Management/Social Worker. Management plans discussed with the patient, Dr Wylie Hail and they are in agreement.  CODE STATUS: Full Code  TOTAL TIME TAKING CARE OF THIS PATIENT: 15 minutes.   More than 50% of the time was spent in counseling/coordination of care: Valaria Good M.D on 11/15/2017 at 7:26 PM  Between 7am to 6pm - Pager - [phone]  After 6pm go to [email or web address] - password [email or web address]  [employer] [place] Hospital[facility]  Office  [phone]  CC: Primary care physician; Mikey College, NP  Note: This dictation was prepared with Dragon dictation along with smaller phrase technology. Any transcriptional errors that result from this process are unintentional.

## 2017-11-15 NOTE — Progress Notes (Addendum)
Pt remains intubated awake and alert on ventilator . Pt is communication via writing. Pt was placed in PS per RT this am . Pt pulled out OG tube this shift . Husband at bedside. Pt denies pain no resp .distress noted. Pt was then placed back on a rate the patient was falling asleep and backup ventilation was in use.

## 2017-11-15 NOTE — Progress Notes (Signed)
This RN and RT transported to patient to CT. Before leaving CCU patient was calm and cooperating, alert and communicating with her husband at bedside. During CT scan, patient became very agitated, would not remain still and continuously raising arms to try and self-extubate. MD notified and 4 mg IV push lorazepam ordered and administered. Pt. Continued to not cooperate and was placing herself at risk by continuing to attempt to self-extubate. Spoke with MD and placed patient in bilateral soft wrist restraints. MD to assess patient at bedside when patient arrives back to CCU.

## 2017-11-15 NOTE — Progress Notes (Addendum)
Nutrition Follow-up  DOCUMENTATION CODES:   Not applicable  INTERVENTION:  Vital 1.2 at 39mL/hr increase by 10 every 8 hours to goal rate of 60mL/hr Pro-stat 72mL daily  Regimen provides 1684 calories, 114gm protein, 1054mL free water Can d/c pro-stat as fever subsides.  Reglan per MD Miralax per MD  Monitor tolerance with stool-filled colon  NUTRITION DIAGNOSIS:   Inadequate oral intake related to inability to eat as evidenced by NPO status. -ongoing  GOAL:   Provide needs based on ASPEN/SCCM guidelines -unmet  MONITOR:   Vent status, Labs, Weight trends, TF tolerance, I & O's    ASSESSMENT:   67 year old female with PMHx of anxiety, depression, PTSD, GERD, HLD, arthritis, rectal fistula, sleep apnea admitted with acute encephalopathy likely secondary to intentional vs accidental drug overdose with baclofen requiring emergent intubation 1/23.  Patient is currently intubated on ventilator support MV: 8.8 L/min Temp (24hrs), Avg:100.1 F (37.8 C), Min:98.6 F (37 C), Max:100.9 F (38.3 C) Propofol: None  -Noted on abdominal x-ray patient had gas and ingested material in the stomach and a stool-filled colon. No small or large bowel distention.  CT of abd today shows stool-filled colon. OGT tip to stomach. Discussed with MD and RN, discussed at rounds.  MAP: 81-102 Precedex gtt  Diet Order:  Diet NPO time specified Seizure precautions  EDUCATION NEEDS:   No education needs have been identified at this time  Skin:  Skin Assessment: Reviewed RN Assessment  Last BM:  Unknown  Height:   Ht Readings from Last 1 Encounters:  11/14/17 5\' 4"  (1.626 m)    Weight:   Wt Readings from Last 1 Encounters:  11/15/17 159 lb 6.3 oz (72.3 kg)    Ideal Body Weight:  54.5 kg  BMI:  Body mass index is 27.36 kg/m.  Estimated Nutritional Needs:   Kcal:  1682 calories  Protein:  90-110 grams (1.2-1.5 grams/kg)  Fluid:  1.6-1.9 L/day (30-35 mL/kg  IBW)  Kim Anis. Saleha Kalp, MS, RD LDN Inpatient Clinical Dietitian Pager [phone]

## 2017-11-15 NOTE — Progress Notes (Addendum)
[facility]ess Note    SYNOPSIS   66 yo female admitted with acute encephalopathy likely secondary to accidental vs. unintentional drug overdose equiring mechanical intubation for airway protection   ASSESSMENT/PLAN   Respiratory failure. Perform spontaneous awakening and breathing trial. Physiologically patient did well however does not have a cuff leak. Change to volume control and inhale the next tidal volumes were the same with the cuff deflated. We'll place on Decadron, obtain CT scan of the neck, continue mechanical ventilation until cough leak prior to extubation.  Critical care time 45 minutes  VENTILATOR SETTINGS: Vent Mode: PRVC FiO2 (%):  [40 %] 40 % Set Rate:  [18 bmp] 18 bmp Vt Set:  [500 mL] 500 mL PEEP:  [5 cmH20] 5 cmH20 Pressure Support:  [5 cmH20-15 cmH20] 15 cmH20 Plateau Pressure:  [14 cmH20-16 cmH20] 14 cmH20   INTAKE / OUTPUT:  Intake/Output Summary (Last 24 hours) at 11/15/2017 0807 Last data filed at 11/15/2017 0500 Gross per 24 hour  Intake 160.98 ml  Output 675 ml  Net -514.02 ml    Name: Kim Buchanan MRN: [id number] DOB: 09-01-1951    ADMISSION DATE:  11/13/2017   SUBJECTIVE:   Pt currently on the ventilator.  Awake, alert, communicating, in no acute distress   VITAL SIGNS: Temp:  [99.3 F (37.4 C)-100.6 F (38.1 C)] 100.2 F (37.9 C) (01/25 0700) Pulse Rate:  [79-87] 87 (01/25 0700) Resp:  [17-21] 17 (01/25 0700) BP: (107-143)/(60-89) 117/89 (01/25 0700) SpO2:  [98 %-100 %] 99 % (01/25 0700) FiO2 (%):  [40 %] 40 % (01/25 0606) Weight:  [159 lb 6.3 oz (72.3 kg)] 159 lb 6.3 oz (72.3 kg) (01/25 0500)  PHYSICAL EXAMINATION: Physical Examination:   VS: BP 117/89   Pulse 87   Temp 100.2 F (37.9 C)   Resp 17   Ht 5\' 4"  (1.626 m)   Wt 159 lb 6.3 oz (72.3 kg)   SpO2 99%   BMI 27.36 kg/m   General Appearance: No distress  Neuro:without focal findings, mental status normal. HEENT: PERRLA, EOM  intact. Pulmonary: normal breath sounds   CardiovascularNormal S1,S2.  No m/r/g.   Abdomen: Benign, Soft, non-tender. Skin:   warm, no rashes, no ecchymosis  Extremities: normal, no cyanosis, clubbing.    LABORATORY PANEL:   CBC Recent Labs  Lab 11/14/17 0605  WBC 10.1  HGB 13.5  HCT 40.0  PLT 230    Chemistries  Recent Labs  Lab 11/13/17 1853 11/14/17 0605  NA 142 142  K 3.9 3.6  CL 110 113*  CO2 25 22  GLUCOSE 94 111*  BUN 21* 17  CREATININE 0.87 0.81  CALCIUM 8.3* 8.1*  AST 19  --   ALT 18  --   ALKPHOS 94  --   BILITOT 0.5  --     Recent Labs  Lab 11/14/17 0131  GLUCAP 93   Recent Labs  Lab 11/13/17 2050 11/14/17 0500  PHART 7.21* 7.50*  PCO2ART 63* 29*  PO2ART 95 160*   Recent Labs  Lab 11/13/17 1853  AST 19  ALT 18  ALKPHOS 94  BILITOT 0.5  ALBUMIN 3.9    Cardiac Enzymes Recent Labs  Lab 11/13/17 1901  TROPONINI <0.03    RADIOLOGY:  Ct Head Wo Contrast  Result Date: 11/13/2017 CLINICAL DATA:  Altered mental status EXAM: CT HEAD WITHOUT CONTRAST TECHNIQUE: Contiguous axial images were obtained from the base of the skull through the vertex without intravenous contrast. COMPARISON:  His head CT 12/19/2011 FINDINGS: Brain: No mass lesion, intraparenchymal hemorrhage or extra-axial collection. No evidence of acute cortical infarct. Brain parenchyma and CSF-containing spaces are normal for age. Vascular: No hyperdense vessel or unexpected calcification. Skull: Normal visualized skull base, calvarium and extracranial soft tissues. Sinuses/Orbits: No sinus fluid levels or advanced mucosal thickening. No mastoid effusion. Normal orbits. IMPRESSION: Normal head CT. Electronically Signed   By: Ulyses Jarred M.D.   On: 11/13/2017 20:01   Dg Chest Portable 1 View  Result Date: 11/13/2017 CLINICAL DATA:  Endotracheal tube placement EXAM: PORTABLE CHEST 1 VIEW COMPARISON:  11/13/2017 FINDINGS: Endotracheal tube was placed with tip measuring 2.9 cm  above the carina. Shallow inspiration with linear atelectasis in the lung bases. Heart size and pulmonary vascularity are normal. No consolidation or airspace disease in the lungs. No blunting of costophrenic angles. No pneumothorax. IMPRESSION: Endotracheal tube appears in satisfactory position. Shallow inspiration with atelectasis in the lung bases. Electronically Signed   By: Lucienne Capers M.D.   On: 11/13/2017 20:58   Dg Chest Portable 1 View  Result Date: 11/13/2017 CLINICAL DATA:  67 year old female with a history altered mental status EXAM: PORTABLE CHEST 1 VIEW COMPARISON:  07/19/2017 FINDINGS: Low lung volumes accentuating the interstitium. Cardiomediastinal silhouette likely unchanged in size and contour. No pneumothorax or pleural effusion. No new confluent airspace disease. Linear/reticular opacity at the left lung base, likely atelectasis. No displaced fracture. IMPRESSION: Low lung volumes without evidence of superimposed acute cardiopulmonary disease. Electronically Signed   By: Corrie Mckusick D.O.   On: 11/13/2017 19:52   Dg Abd Portable 1 View  Result Date: 11/14/2017 CLINICAL DATA:  OG tube placement EXAM: PORTABLE ABDOMEN - 1 VIEW COMPARISON:  CT abdomen and pelvis 05/01/2016 FINDINGS: Enteric tube tip is in the left upper quadrant consistent with location in the body of the stomach. Gas and ingested material is demonstrated in the stomach. Changes could reflect gastroparesis or decreased motility. Stool-filled colon. No small or large bowel distention. No radiopaque stones. Postoperative changes in the pelvis. Degenerative changes in the spine. IMPRESSION: Enteric tube tip is in the left upper quadrant consistent with location in the body of the stomach. Gas and ingested material filled the stomach possibly indicating decreased motility. Electronically Signed   By: Lucienne Capers M.D.   On: 11/14/2017 00:51    Hermelinda Dellen, DO 1/25/2019Patient ID: Kim Buchanan, female   DOB:  December 25, 1950, 67 y.o.   MRN: [id number]

## 2017-11-16 LAB — BASIC METABOLIC PANEL
Anion gap: 9 (ref 5–15)
BUN: 25 mg/dL — ABNORMAL HIGH (ref 6–20)
CO2: 21 mmol/L — ABNORMAL LOW (ref 22–32)
Calcium: 8.1 mg/dL — ABNORMAL LOW (ref 8.9–10.3)
Chloride: 109 mmol/L (ref 101–111)
Creatinine, Ser: 0.67 mg/dL (ref 0.44–1.00)
GFR calc Af Amer: >60 mL/min (ref >60)
GFR calc non Af Amer: >60 mL/min (ref >60)
Glucose, Bld: 155 mg/dL — ABNORMAL HIGH (ref 65–99)
Potassium: 3.5 mmol/L (ref 3.5–5.1)
Sodium: 139 mmol/L (ref 135–145)

## 2017-11-16 LAB — GLUCOSE, CAPILLARY
Glucose-Capillary: 149 mg/dL — ABNORMAL HIGH (ref 65–99)
Glucose-Capillary: 156 mg/dL — ABNORMAL HIGH (ref 65–99)
Glucose-Capillary: 156 mg/dL — ABNORMAL HIGH (ref 65–99)
Glucose-Capillary: 157 mg/dL — ABNORMAL HIGH (ref 65–99)
Glucose-Capillary: 173 mg/dL — ABNORMAL HIGH (ref 65–99)
Glucose-Capillary: 175 mg/dL — ABNORMAL HIGH (ref 65–99)

## 2017-11-16 LAB — TRIGLYCERIDES: Triglycerides: 57 mg/dL (ref <150)

## 2017-11-16 LAB — MAGNESIUM
Magnesium: 2.3 mg/dL (ref 1.7–2.4)
Magnesium: 2.3 mg/dL (ref 1.7–2.4)

## 2017-11-16 LAB — PHOSPHORUS
Phosphorus: 4.6 mg/dL (ref 2.5–4.6)
Phosphorus: 3.6 mg/dL (ref 2.5–4.6)

## 2017-11-16 MED ORDER — MIDAZOLAM HCL 2 MG/2ML IJ SOLN
1.0000 mg | INTRAMUSCULAR | Status: DC | PRN
  Administered 2017-11-16 – 2017-11-17 (×4): 2 mg via INTRAVENOUS
  Filled 2017-11-16 (×4): qty 2

## 2017-11-16 NOTE — Progress Notes (Signed)
[facility] Progess Note    SYNOPSIS   67 yo female admitted with acute encephalopathy likely secondary to accidental vs. unintentional drug overdose equiring mechanical intubation for airway protection   ASSESSMENT/PLAN   Respiratory failure. Extubation was held yesterday secondary to no cough leak. CT scan of the neck revealed significant edema surrounding the endotracheal tube without any structural abnormalities. Patient has been placed on Decadron. Will give another day and perform cuff leak test tomorrow along with spontaneous awakening and breathing trial to evaluate for extubation.   History of depression/anxiety. Patient is on propofol and Precedex with as needed fentanyl  Gastroesophageal reflux disease. Presently on H2 blockers  Hyperglycemia noted. On coverage  Critical care time 45 minutes  VENTILATOR SETTINGS: Vent Mode: PRVC FiO2 (%):  [40 %] 40 % Set Rate:  [18 bmp] 18 bmp Vt Set:  [500 mL] 500 mL PEEP:  [5 cmH20] 5 cmH20   INTAKE / OUTPUT:  Intake/Output Summary (Last 24 hours) at 11/16/2017 0828 Last data filed at 11/16/2017 0500 Gross per 24 hour  Intake 971.87 ml  Output 685 ml  Net 286.87 ml    Name: Kim Buchanan MRN: [id number] DOB: [birth year]    ADMISSION DATE:  11/13/2017   SUBJECTIVE:   Pt currently on the ventilator.  Awake, alert, communicating, in no acute distress   VITAL SIGNS: Temp:  [98.6 F (37 C)-100.9 F (38.3 C)] 99.1 F (37.3 C) (01/26 0600) Pulse Rate:  [53-90] 56 (01/26 0600) Resp:  [14-24] 18 (01/26 0600) BP: (82-155)/(52-115) 82/52 (01/26 0600) SpO2:  [96 %-100 %] 99 % (01/26 0811) FiO2 (%):  [40 %] 40 % (01/26 0811) Weight:  [154 lb 8.7 oz (70.1 kg)] 154 lb 8.7 oz (70.1 kg) (01/26 0500)  PHYSICAL EXAMINATION: Physical Examination:   VS: BP (!) 82/52   Pulse (!) 56   Temp 99.1 F (37.3 C)   Resp 18   Ht 5\' 4"  (1.626 m)   Wt 154 lb 8.7 oz (70.1 kg)   SpO2 99%   BMI 26.53 kg/m     General Appearance: No distress  Neuro:without focal findings, mental status normal. HEENT: PERRLA, EOM intact. Pulmonary: normal breath sounds   CardiovascularNormal S1,S2.  No m/r/g.   Abdomen: Benign, Soft, non-tender. Skin:   warm, no rashes, no ecchymosis  Extremities: normal, no cyanosis, clubbing.    LABORATORY PANEL:   CBC Recent Labs  Lab 11/14/17 0605  WBC 10.1  HGB 13.5  HCT 40.0  PLT 230    Chemistries  Recent Labs  Lab 11/13/17 1853  11/16/17 0522  NA 142   < > 139  K 3.9   < > 3.5  CL 110   < > 109  CO2 25   < > 21*  GLUCOSE 94   < > 155*  BUN 21*   < > 25*  CREATININE 0.87   < > 0.67  CALCIUM 8.3*   < > 8.1*  MG  --    < > 2.3  PHOS  --    < > 4.6  AST 19  --   --   ALT 18  --   --   ALKPHOS 94  --   --   BILITOT 0.5  --   --    < > = values in this interval not displayed.    Recent Labs  Lab 11/14/17 0131 11/15/17 2202 11/16/17 0054 11/16/17 0334 11/16/17 0809  GLUCAP 93 182* 149* 156* 157*  Recent Labs  Lab 11/13/17 2050 11/14/17 0500  PHART 7.21* 7.50*  PCO2ART 63* 29*  PO2ART 95 160*   Recent Labs  Lab 11/13/17 1853  AST 19  ALT 18  ALKPHOS 94  BILITOT 0.5  ALBUMIN 3.9    Cardiac Enzymes Recent Labs  Lab 11/13/17 1901  TROPONINI <0.03    RADIOLOGY:  Dg Abd 1 View  Result Date: 11/15/2017 CLINICAL DATA:  OG tube placement. EXAM: ABDOMEN - 1 VIEW COMPARISON:  Single-view of the abdomen 11/14/2017. FINDINGS: OG tube is in place with the tip in the midbody of the stomach. Large colonic stool burden noted. IMPRESSION: OG tube tip is in the stomach. Large colonic stool burden. Electronically Signed   By: Inge Rise M.D.   On: 11/15/2017 12:44   Ct Soft Tissue Neck Wo Contrast  Result Date: 11/15/2017 CLINICAL DATA:  Chronic dyspnea. EXAM: CT NECK WITHOUT CONTRAST TECHNIQUE: Multidetector CT imaging of the neck was performed following the standard protocol without intravenous contrast. COMPARISON:  None.  FINDINGS: Pharynx and larynx: The patient is intubated. An NG tube is in place. The cough is below the vocal cords and partially deflated. No definite mucosal or submucosal lesions are present. Salivary glands: Within normal limits. Thyroid: The thyroid is normal. There is edema surrounding the trachea below the vocal cords. Lymph nodes: No significant adenopathy is present. Vascular: No significant vascular lesions are present. Limited intracranial: Within normal limits. Visualized orbits: Globes and orbits are within normal limits as well. Mastoids and visualized paranasal sinuses: The paranasal sinuses and mastoid air cells are clear. Skeleton: Mild endplate changes and uncovertebral spurring is present at C4-5, C5-6, and C6-7. Vertebral body heights are maintained. Alignment is anatomic. No focal lytic or blastic lesions are present. Upper chest: Lung apices are clear. IMPRESSION: 1. The patient is intubated. Cuff is partially deflated. Mild edema surrounds the upper trachea without a focal mucosal lesion. No mass lesion is present. There is no defect. 2. Mild degenerative changes in the cervical spine. Electronically Signed   By: San Morelle M.D.   On: 11/15/2017 12:24    Hermelinda Dellen, DO 1/26/2019Patient ID: Kim Buchanan, female   DOB: 11-Oct-1951, 67 y.o.   MRN: [id number] Patient ID: Kim Buchanan, female   DOB: 1950-11-12, 66 y.o.   MRN: [id number]

## 2017-11-16 NOTE — Progress Notes (Signed)
[facility] [place] NAME: Kim Buchanan    MR#:  [id number]  DATE OF BIRTH:  22-Dec-1950  SUBJECTIVE:  CHIEF COMPLAINT:   Chief Complaint  Patient presents with  . Drug Overdose  . Altered Mental Status   Remains on the ventilator tolerated pressure support yesterday  REVIEW OF SYSTEMS:  Review of Systems  Unable to perform ROS: Intubated   DRUG ALLERGIES:  No Known Allergies VITALS:  Blood pressure (!) 84/55, pulse 60, temperature 99.1 F (37.3 C), resp. rate 18, height 5\' 4"  (1.626 m), weight 154 lb 8.7 oz (70.1 kg), SpO2 97 %. PHYSICAL EXAMINATION:  Physical Exam  Constitutional: She is oriented to person, place, and time and well-developed, well-nourished, and in no distress.  HENT:  Head: Normocephalic and atraumatic.  ETT in place  Eyes: Conjunctivae and EOM are normal. Pupils are equal, round, and reactive to light.  Neck: Normal range of motion. Neck supple. No tracheal deviation present. No thyromegaly present.  Cardiovascular: Normal rate, regular rhythm and normal heart sounds.  Pulmonary/Chest: Effort normal and breath sounds normal. No respiratory distress. She has no wheezes. She exhibits no tenderness.  Abdominal: Soft. Bowel sounds are normal. She exhibits no distension. There is no tenderness.  Musculoskeletal: Normal range of motion.  Neurological: She is alert and oriented to person, place, and time. No cranial nerve deficit.  Skin: Skin is warm and dry. No rash noted.  Psychiatric: Mood and affect normal.   LABORATORY PANEL:  Female CBC Recent Labs  Lab 11/14/17 0605  WBC 10.1  HGB 13.5  HCT 40.0  PLT 230   ------------------------------------------------------------------------------------------------------------------ Chemistries  Recent Labs  Lab 11/13/17 1853  11/16/17 0522  NA 142   < > 139  K 3.9   < > 3.5  CL 110   < > 109  CO2 25   < > 21*  GLUCOSE 94   < > 155*  BUN 21*   < > 25*    CREATININE 0.87   < > 0.67  CALCIUM 8.3*   < > 8.1*  MG  --    < > 2.3  AST 19  --   --   ALT 18  --   --   ALKPHOS 94  --   --   BILITOT 0.5  --   --    < > = values in this interval not displayed.   RADIOLOGY:  No results found. ASSESSMENT AND PLAN:  67 yo female admitted with acute encephalopathy likely secondary to accidental vs. unintentional drug overdose requiring mechanical intubation for airway protection  * Acute respiratory failure with hypercapnia (HCC) -remains on vent - vent mgmt per PCCM, swelling/edema around trachea seen on CT, continue Decadron Wean off the ventilator as tolerated  * Depression, major, recurrent ([place]) -holding medications at this time, patient is intubated resume once awake   * Generalized anxiety disorder -holding home meds at this time as above  * GERD (gastroesophageal reflux disease) - IV pepcid       All the records are reviewed and case discussed with Care Management/Social Worker. Management plans discussed with the patient, Dr Wylie Hail and they are in agreement.  CODE STATUS: Full Code  TOTAL TIME TAKING CARE OF THIS PATIENT: 15 minutes.   More than 50% of the time was spent in counseling/coordination of care: Tawni Levy M.D on 11/16/2017 at 3:27 PM  Between 7am to 6pm -  Pager - [phone]  After 6pm go to [email or web address] - password [email or web address] [facility]  [facility] [employer]  Office  [phone]  CC: Primary care physician; Mikey College, NP  Note: This dictation was prepared with Dragon dictation along with smaller phrase technology. Any transcriptional errors that result from this process are unintentional.

## 2017-11-16 NOTE — Progress Notes (Signed)
No air leak after deflating cuff.

## 2017-11-16 NOTE — Progress Notes (Signed)
R.T performed a leak test for the ETT cuff and there remains no leak.  Will defer attempt to extubate today per Dr. Jefferson Fuel.  Pt remains very agitated and requires con't propofol with increased rate changes and prn Versed.  Bubba Camp, RN

## 2017-11-17 ENCOUNTER — Inpatient Hospital Stay: Payer: Medicare Other

## 2017-11-17 LAB — BASIC METABOLIC PANEL
Anion gap: 11 (ref 5–15)
BUN: 22 mg/dL — ABNORMAL HIGH (ref 6–20)
CO2: 18 mmol/L — ABNORMAL LOW (ref 22–32)
Calcium: 8 mg/dL — ABNORMAL LOW (ref 8.9–10.3)
Chloride: 104 mmol/L (ref 101–111)
Creatinine, Ser: 0.62 mg/dL (ref 0.44–1.00)
GFR calc Af Amer: >60 mL/min (ref >60)
GFR calc non Af Amer: >60 mL/min (ref >60)
Glucose, Bld: 161 mg/dL — ABNORMAL HIGH (ref 65–99)
Potassium: 4 mmol/L (ref 3.5–5.1)
Sodium: 133 mmol/L — ABNORMAL LOW (ref 135–145)

## 2017-11-17 LAB — GLUCOSE, CAPILLARY
Glucose-Capillary: 118 mg/dL — ABNORMAL HIGH (ref 65–99)
Glucose-Capillary: 128 mg/dL — ABNORMAL HIGH (ref 65–99)
Glucose-Capillary: 156 mg/dL — ABNORMAL HIGH (ref 65–99)
Glucose-Capillary: 165 mg/dL — ABNORMAL HIGH (ref 65–99)
Glucose-Capillary: 180 mg/dL — ABNORMAL HIGH (ref 65–99)

## 2017-11-17 LAB — MAGNESIUM: Magnesium: 2.4 mg/dL (ref 1.7–2.4)

## 2017-11-17 LAB — TRIGLYCERIDES: Triglycerides: 1260 mg/dL — ABNORMAL HIGH (ref <150)

## 2017-11-17 LAB — PHOSPHORUS: Phosphorus: 2.8 mg/dL (ref 2.5–4.6)

## 2017-11-17 MED ORDER — VENLAFAXINE HCL ER 75 MG PO CP24
150.0000 mg | ORAL_CAPSULE | Freq: Every day | ORAL | Status: DC
  Administered 2017-11-18 – 2017-11-20 (×3): 150 mg via ORAL
  Filled 2017-11-17 (×3): qty 2

## 2017-11-17 MED ORDER — ORAL CARE MOUTH RINSE
15.0000 mL | Freq: Two times a day (BID) | OROMUCOSAL | Status: DC
  Administered 2017-11-17 – 2017-11-21 (×7): 15 mL via OROMUCOSAL

## 2017-11-17 MED ORDER — CLONAZEPAM 0.5 MG PO TABS
0.5000 mg | ORAL_TABLET | Freq: Every evening | ORAL | Status: DC | PRN
  Administered 2017-11-17 – 2017-11-18 (×2): 0.5 mg via ORAL
  Filled 2017-11-17 (×2): qty 1

## 2017-11-17 MED ORDER — FENTANYL CITRATE (PF) 100 MCG/2ML IJ SOLN: 25.0000 ug | INTRAMUSCULAR | Status: DC | PRN

## 2017-11-17 NOTE — Progress Notes (Signed)
Spoke to Dr. Jefferson Fuel on the phone about patient's condition this afternoon after extubation. Patient resting comfortably in bed, alert and oriented, no complaints of shortness of breath, no nausea or vomiting. MD okayed clear liquid diet.

## 2017-11-17 NOTE — Progress Notes (Signed)
[facility] Progess Note    SYNOPSIS   67 yo female admitted with acute encephalopathy likely secondary to accidental vs. unintentional drug overdose equiring mechanical intubation for airway protection   ASSESSMENT/PLAN   Respiratory failure. Extubation held secondary to no cough leak. CT scan of the neck revealed significant edema surrounding the endotracheal tube without any structural abnormalities. Patient has been placed on Decadron. Will repeat cuff leak test this morning and evaluate for extubation  History of depression/anxiety. Patient is on propofol and Precedex with as needed fentanyl. Will stop propofol secondary to elevated triglycerides  Gastroesophageal reflux disease. Presently on H2 blockers  Hyperglycemia noted. On coverage  Critical care time 35 minutes  VENTILATOR SETTINGS: Vent Mode: PRVC FiO2 (%):  [40 %] 40 % Set Rate:  [18 bmp] 18 bmp Vt Set:  [500 mL] 500 mL PEEP:  [5 cmH20] 5 cmH20 Plateau Pressure:  [16 cmH20-17 cmH20] 16 cmH20   INTAKE / OUTPUT:  Intake/Output Summary (Last 24 hours) at 11/17/2017 0817 Last data filed at 11/17/2017 0600 Gross per 24 hour  Intake 1617.22 ml  Output 550 ml  Net 1067.22 ml    Name: Kim Buchanan MRN: [id number] DOB: 30-[name]-1951    ADMISSION DATE:  11/13/2017   SUBJECTIVE:   Pt currently on the ventilator.  Awake, alert, communicating, in no acute distress   VITAL SIGNS: Temp:  [99.1 F (37.3 C)-100.4 F (38 C)] 99.3 F (37.4 C) (01/27 0600) Pulse Rate:  [55-84] 61 (01/27 0600) Resp:  [13-18] 18 (01/27 0600) BP: (84-143)/(49-99) 100/52 (01/27 0600) SpO2:  [90 %-100 %] 100 % (01/27 0600) FiO2 (%):  [40 %] 40 % (01/27 0306) Weight:  [157 lb 10.1 oz (71.5 kg)] 157 lb 10.1 oz (71.5 kg) (01/27 0454)  PHYSICAL EXAMINATION: Physical Examination:   VS: BP (!) 100/52   Pulse 61   Temp 99.3 F (37.4 C)   Resp 18   Ht 5\' 4"  (1.626 m)   Wt 157 lb 10.1 oz (71.5 kg)   SpO2 100%    BMI 27.06 kg/m   General Appearance: No distress  Neuro:without focal findings, mental status normal. HEENT: PERRLA, EOM intact. Pulmonary: normal breath sounds   CardiovascularNormal S1,S2.  No m/r/g.   Abdomen: Benign, Soft, non-tender. Skin:   warm, no rashes, no ecchymosis  Extremities: normal, no cyanosis, clubbing.    LABORATORY PANEL:   CBC Recent Labs  Lab 11/14/17 0605  WBC 10.1  HGB 13.5  HCT 40.0  PLT 230    Chemistries  Recent Labs  Lab 11/13/17 1853  11/17/17 0504  NA 142   < > 133*  K 3.9   < > 4.0  CL 110   < > 104  CO2 25   < > 18*  GLUCOSE 94   < > 161*  BUN 21*   < > 22*  CREATININE 0.87   < > 0.62  CALCIUM 8.3*   < > 8.0*  MG  --    < > 2.4  PHOS  --    < > 2.8  AST 19  --   --   ALT 18  --   --   ALKPHOS 94  --   --   BILITOT 0.5  --   --    < > = values in this interval not displayed.    Recent Labs  Lab 11/16/17 1203 11/16/17 1609 11/16/17 2010 11/17/17 0007 11/17/17 0449 11/17/17 0726  GLUCAP 156* 173* 175*  165* 180* 156*   Recent Labs  Lab 11/13/17 2050 11/14/17 0500  PHART 7.21* 7.50*  PCO2ART 63* 29*  PO2ART 95 160*   Recent Labs  Lab 11/13/17 1853  AST 19  ALT 18  ALKPHOS 94  BILITOT 0.5  ALBUMIN 3.9    Cardiac Enzymes Recent Labs  Lab 11/13/17 1901  TROPONINI <0.03    RADIOLOGY:  Dg Abd 1 View  Result Date: 11/15/2017 CLINICAL DATA:  OG tube placement. EXAM: ABDOMEN - 1 VIEW COMPARISON:  Single-view of the abdomen 11/14/2017. FINDINGS: OG tube is in place with the tip in the midbody of the stomach. Large colonic stool burden noted. IMPRESSION: OG tube tip is in the stomach. Large colonic stool burden. Electronically Signed   By: Inge Rise M.D.   On: 11/15/2017 12:44   Ct Soft Tissue Neck Wo Contrast  Result Date: 11/15/2017 CLINICAL DATA:  Chronic dyspnea. EXAM: CT NECK WITHOUT CONTRAST TECHNIQUE: Multidetector CT imaging of the neck was performed following the standard protocol without  intravenous contrast. COMPARISON:  None. FINDINGS: Pharynx and larynx: The patient is intubated. An NG tube is in place. The cough is below the vocal cords and partially deflated. No definite mucosal or submucosal lesions are present. Salivary glands: Within normal limits. Thyroid: The thyroid is normal. There is edema surrounding the trachea below the vocal cords. Lymph nodes: No significant adenopathy is present. Vascular: No significant vascular lesions are present. Limited intracranial: Within normal limits. Visualized orbits: Globes and orbits are within normal limits as well. Mastoids and visualized paranasal sinuses: The paranasal sinuses and mastoid air cells are clear. Skeleton: Mild endplate changes and uncovertebral spurring is present at C4-5, C5-6, and C6-7. Vertebral body heights are maintained. Alignment is anatomic. No focal lytic or blastic lesions are present. Upper chest: Lung apices are clear. IMPRESSION: 1. The patient is intubated. Cuff is partially deflated. Mild edema surrounds the upper trachea without a focal mucosal lesion. No mass lesion is present. There is no defect. 2. Mild degenerative changes in the cervical spine. Electronically Signed   By: San Morelle M.D.   On: 11/15/2017 12:24   Dg Chest Port 1 View  Result Date: 11/17/2017 CLINICAL DATA:  Acute respiratory failure EXAM: PORTABLE CHEST 1 VIEW COMPARISON:  11/13/2017 FINDINGS: Cardiac shadow is stable. Endotracheal tube and nasogastric catheter are again seen and stable. Elevation the right hemidiaphragm is again noted with right basilar infiltrate and likely small effusion. No bony abnormality is noted. IMPRESSION: New right basilar infiltrate and effusion. Electronically Signed   By: Inez Catalina M.D.   On: 11/17/2017 07:39    Hermelinda Dellen, DO 1/27/2019Patient ID: Kim Buchanan, female   DOB: 31-Oct-1950, 67 y.o.   MRN: [id number] Patient ID: Kim Buchanan, female   DOB: Mar 21, 1951, 67 y.o.   MRN:  [id number] Patient ID: Kim Buchanan, female   DOB: 1951-03-20, 67 y.o.   MRN: [id number]

## 2017-11-17 NOTE — Progress Notes (Signed)
[facility] at [place] NAME: Kim Buchanan    MR#:  [id number]  DATE OF BIRTH:  Mar 23, 1951  SUBJECTIVE:  CHIEF COMPLAINT:   Chief Complaint  Patient presents with  . Drug Overdose  . Altered Mental Status   Remains on vent, unable to extube  REVIEW OF SYSTEMS:  Review of Systems  Unable to perform ROS: Intubated   DRUG ALLERGIES:  No Known Allergies VITALS:  Blood pressure 109/85, pulse 79, temperature 98.4 F (36.9 C), temperature source Oral, resp. rate (!) 25, height 5\' 4"  (1.626 m), weight 157 lb 10.1 oz (71.5 kg), SpO2 90 %. PHYSICAL EXAMINATION:  Physical Exam  Constitutional: She is oriented to person, place, and time and well-developed, well-nourished, and in no distress.  HENT:  Head: Normocephalic and atraumatic.  ETT in place  Eyes: Conjunctivae and EOM are normal. Pupils are equal, round, and reactive to light.  Neck: Normal range of motion. Neck supple. No tracheal deviation present. No thyromegaly present.  Cardiovascular: Normal rate, regular rhythm and normal heart sounds.  Pulmonary/Chest: Effort normal and breath sounds normal. No respiratory distress. She has no wheezes. She exhibits no tenderness.  Abdominal: Soft. Bowel sounds are normal. She exhibits no distension. There is no tenderness.  Musculoskeletal: Normal range of motion.  Neurological: She is alert and oriented to person, place, and time. No cranial nerve deficit.  Skin: Skin is warm and dry. No rash noted.  Psychiatric: Mood and affect normal.   LABORATORY PANEL:  Female CBC Recent Labs  Lab 11/14/17 0605  WBC 10.1  HGB 13.5  HCT 40.0  PLT 230   ------------------------------------------------------------------------------------------------------------------ Chemistries  Recent Labs  Lab 11/13/17 1853  11/17/17 0504  NA 142   < > 133*  K 3.9   < > 4.0  CL 110   < > 104  CO2 25   < > 18*  GLUCOSE 94   < > 161*  BUN 21*   < > 22*    CREATININE 0.87   < > 0.62  CALCIUM 8.3*   < > 8.0*  MG  --    < > 2.4  AST 19  --   --   ALT 18  --   --   ALKPHOS 94  --   --   BILITOT 0.5  --   --    < > = values in this interval not displayed.   RADIOLOGY:  Dg Chest Port 1 View  Result Date: 11/17/2017 CLINICAL DATA:  Acute respiratory failure EXAM: PORTABLE CHEST 1 VIEW COMPARISON:  11/13/2017 FINDINGS: Cardiac shadow is stable. Endotracheal tube and nasogastric catheter are again seen and stable. Elevation the right hemidiaphragm is again noted with right basilar infiltrate and likely small effusion. No bony abnormality is noted. IMPRESSION: New right basilar infiltrate and effusion. Electronically Signed   By: Inez Catalina M.D.   On: 11/17/2017 07:39   ASSESSMENT AND PLAN:  67 yo female admitted with acute encephalopathy likely secondary to accidental vs. unintentional drug overdose requiring mechanical intubation for airway protection  * Acute respiratory failure with hypercapnia (HCC) -remains on vent - vent mgmt per PCCM, swelling/edema around trachea seen on CT, continue Decadron Wean off the ventilator as tolerated  * Depression, major, recurrent (HCC) -holding medications at this time, patient is intubated resume once awake   * Generalized anxiety disorder -holding home meds at this time as above  * GERD (gastroesophageal reflux disease) - IV pepcid  All the records are reviewed and case discussed with Care Management/Social Worker. Management plans discussed with the patient, Dr Wylie Hail and they are in agreement.  CODE STATUS: Full Code  TOTAL TIME TAKING CARE OF THIS PATIENT: 15 minutes.   More than 50% of the time was spent in counseling/coordination of care: Tawni Levy M.D on 11/17/2017 at 3:02 PM  Between 7am to 6pm - Pager - [phone]  After 6pm go to [email or web address] - password [email or web address] [facility]  [facility] [place] [facility]  Office  [phone]  CC: Primary care  physician; Mikey College, NP  Note: This dictation was prepared with Dragon dictation along with smaller phrase technology. Any transcriptional errors that result from this process are unintentional.

## 2017-11-17 NOTE — Progress Notes (Signed)
Pt. Extubated and placed on 6lnc,sat 93,rr 18.No apparent distress noted at this time.

## 2017-11-18 ENCOUNTER — Ambulatory Visit
Payer: Medicare Other | Attending: Student in an Organized Health Care Education/Training Program | Admitting: Student in an Organized Health Care Education/Training Program

## 2017-11-18 DIAGNOSIS — J9602 Acute respiratory failure with hypercapnia: Secondary | ICD-10-CM

## 2017-11-18 LAB — CBC
HCT: 39 % (ref 35.0–47.0)
Hemoglobin: 13.2 g/dL (ref 12.0–16.0)
MCH: 30.9 pg (ref 26.0–34.0)
MCHC: 33.9 g/dL (ref 32.0–36.0)
MCV: 91.1 fL (ref 80.0–100.0)
Platelets: 194 10*3/uL (ref 150–440)
RBC: 4.28 MIL/uL (ref 3.80–5.20)
RDW: 12.9 % (ref 11.5–14.5)
WBC: 7.4 10*3/uL (ref 3.6–11.0)

## 2017-11-18 LAB — BASIC METABOLIC PANEL
Anion gap: 9 (ref 5–15)
BUN: 18 mg/dL (ref 6–20)
CO2: 25 mmol/L (ref 22–32)
Calcium: 8.2 mg/dL — ABNORMAL LOW (ref 8.9–10.3)
Chloride: 104 mmol/L (ref 101–111)
Creatinine, Ser: 0.58 mg/dL (ref 0.44–1.00)
GFR calc Af Amer: >60 mL/min (ref >60)
GFR calc non Af Amer: >60 mL/min (ref >60)
Glucose, Bld: 104 mg/dL — ABNORMAL HIGH (ref 65–99)
Potassium: 3.5 mmol/L (ref 3.5–5.1)
Sodium: 138 mmol/L (ref 135–145)

## 2017-11-18 LAB — GLUCOSE, CAPILLARY
Glucose-Capillary: 118 mg/dL — ABNORMAL HIGH (ref 65–99)
Glucose-Capillary: 126 mg/dL — ABNORMAL HIGH (ref 65–99)
Glucose-Capillary: 95 mg/dL (ref 65–99)
Glucose-Capillary: 99 mg/dL (ref 65–99)

## 2017-11-18 LAB — TRIGLYCERIDES: Triglycerides: 74 mg/dL (ref <150)

## 2017-11-18 MED ORDER — VENLAFAXINE HCL ER 75 MG PO CP24
75.0000 mg | ORAL_CAPSULE | Freq: Every day | ORAL | Status: DC
  Administered 2017-11-18 – 2017-11-20 (×3): 75 mg via ORAL
  Filled 2017-11-18 (×3): qty 1

## 2017-11-18 MED ORDER — PHENOL 1.4 % MT LIQD
1.0000 | OROMUCOSAL | Status: DC | PRN
  Filled 2017-11-18: qty 177

## 2017-11-18 MED ORDER — IPRATROPIUM-ALBUTEROL 0.5-2.5 (3) MG/3ML IN SOLN
3.0000 mL | Freq: Four times a day (QID) | RESPIRATORY_TRACT | Status: DC | PRN
  Administered 2017-11-18: 3 mL via RESPIRATORY_TRACT
  Filled 2017-11-18: qty 3

## 2017-11-18 NOTE — Progress Notes (Signed)
Nutrition Follow-up  DOCUMENTATION CODES:   Not applicable  INTERVENTION:  1. Ensure Enlive po BID, each supplement provides 350 kcal and 20 grams of protein  NUTRITION DIAGNOSIS:   Inadequate oral intake related to inability to eat as evidenced by NPO status. -resolved  GOAL:   Patient will meet greater than or equal to 90% of their needs -new dx  MONITOR:   I & O's, Labs, Skin, Weight trends, PO intake, Supplement acceptance  ASSESSMENT:   67 year old female with PMHx of anxiety, depression, PTSD, GERD, HLD, arthritis, rectal fistula, sleep apnea admitted with acute encephalopathy likely secondary to intentional vs accidental drug overdose with baclofen requiring emergent intubation 1/23.  Patient extubated 01/27 now on 4L [place] Monitor PO intake. Was on clears yesterday, did ok. Was hungry this morning on clears and got a tray.  Labs reviewed Medications reviewed and include:  Miralax  Diet Order:  Diet heart healthy/carb modified Room service appropriate? Yes; Fluid consistency: Thin  EDUCATION NEEDS:   No education needs have been identified at this time  Skin:  Skin Assessment: Skin Integrity Issues: Skin Integrity Issues:: Other (Comment) Other: Abrasion to L Leg, Ecchymosis to R hand  Last BM:  11/18/2017 (Type 7)  Height:   Ht Readings from Last 1 Encounters:  11/14/17 5\' 4"  (1.626 m)    Weight:   Wt Readings from Last 1 Encounters:  11/18/17 160 lb 4.4 oz (72.7 kg)    Ideal Body Weight:  54.5 kg  BMI:  Body mass index is 27.51 kg/m.  Estimated Nutritional Needs:   Kcal:  1600-1900 calories  Protein:  90-110 grams (1.2-1.5 grams/kg)  Fluid:  1.6-1.9 L/day (30-35 mL/kg IBW)  Satira Anis. Jarielys Girardot, MS, RD LDN Inpatient Clinical Dietitian Pager [phone]

## 2017-11-18 NOTE — Progress Notes (Signed)
[facility] at [place] NAME: Kim Buchanan    MR#:  [id number]  DATE OF BIRTH:  04-08-1951  SUBJECTIVE:  CHIEF COMPLAINT:   Chief Complaint  Patient presents with  . Drug Overdose  . Altered Mental Status    Extubated  But confused   REVIEW OF SYSTEMS:  Review of Systems  Unable to perform ROS: Mental status change   DRUG ALLERGIES:  No Known Allergies VITALS:  Blood pressure (!) 147/105, pulse 96, temperature (!) 97.5 F (36.4 C), temperature source Axillary, resp. rate (!) 31, height 5\' 4"  (1.626 m), weight 160 lb 4.4 oz (72.7 kg), SpO2 91 %. PHYSICAL EXAMINATION:   GENERAL: Pleasant-appearing in confusing HEAD, EYES, EARS, NOSE AND THROAT: Atraumatic, normocephalic. Extraocular muscles are intact. Pupils equal and reactive to light. Sclerae anicteric. No conjunctival injection. No oro-pharyngeal erythema.  NECK: Supple. There is no jugular venous distention. No bruits, no lymphadenopathy, no thyromegaly.  HEART: Regular rate and rhythm, tachycardic. No murmurs, no rubs, no clicks.  LUNGS: Clear to auscultation bilaterally. No rales or rhonchi. No wheezes.  ABDOMEN: Soft, flat, nontender, nondistended. Has good bowel sounds. No hepatosplenomegaly appreciated.  EXTREMITIES: No evidence of any cyanosis, clubbing, or peripheral edema.  +2 pedal and radial pulses bilaterally.  SKIN: Moist and warm with no rashes appreciated.  MUSCULOSKELETAL: denies any joint swelling, gout LYMPHATIC: No cervical or axillary lymphadenopathy.  NEUROLOGIC: confused PYCH: Not anxious or depressed  LABORATORY PANEL:  Female CBC Recent Labs  Lab 11/18/17 0457  WBC 7.4  HGB 13.2  HCT 39.0  PLT 194   ------------------------------------------------------------------------------------------------------------------ Chemistries  Recent Labs  Lab 11/13/17 1853  11/17/17 0504 11/18/17 0457  NA 142   < > 133* 138  K 3.9   < > 4.0 3.5  CL 110   <  > 104 104  CO2 25   < > 18* 25  GLUCOSE 94   < > 161* 104*  BUN 21*   < > 22* 18  CREATININE 0.87   < > 0.62 0.58  CALCIUM 8.3*   < > 8.0* 8.2*  MG  --    < > 2.4  --   AST 19  --   --   --   ALT 18  --   --   --   ALKPHOS 94  --   --   --   BILITOT 0.5  --   --   --    < > = values in this interval not displayed.   RADIOLOGY:  No results found. ASSESSMENT AND PLAN:  67 yo female admitted with acute encephalopathy likely secondary to accidental vs. unintentional drug overdose requiring mechanical intubation for airway protection  * Acute respiratory failure with hypercapnia (HCC) due to possible overdose S/p extubation Continue to monitor  * Depression, major, recurrent (HCC) -holding medications at this time, patient is intubated resume once awake   * Generalized anxiety disorder -holding home meds at this time as above  * GERD (gastroesophageal reflux disease) - IV pepcid       All the records are reviewed and case discussed with Care Management/Social Worker. Management plans discussed with the patient, Dr Wylie Hail and they are in agreement.  CODE STATUS: Full Code  TOTAL TIME TAKING CARE OF THIS PATIENT: 15 minutes.   More than 50% of the time was spent in counseling/coordination of care: Tawni Levy M.D on 11/18/2017 at 5:09 PM  Between 7am to 6pm - Pager - [phone]  After 6pm go to [email or web address] - password [email or web address] [facility]  [facility] [place] [facility]  Office  [phone]  CC: Primary care physician; Mikey College, NP  Note: This dictation was prepared with Dragon dictation along with smaller phrase technology. Any transcriptional errors that result from this process are unintentional.

## 2017-11-18 NOTE — Progress Notes (Signed)
Tolerating extubation well No new complaints No distress  Vitals:   11/18/17 0700 11/18/17 0800 11/18/17 0900 11/18/17 1000  BP: (!) 145/86 (!) 89/64 (!) 89/46 122/84  Pulse: 76 (!) 25 96 91  Resp: (!) 23 18 (!) 23 20  Temp:  (!) 97.5 F (36.4 C)    TempSrc:  Axillary    SpO2: 93% 100% 92% 90%  Weight:      Height:        NAD HEENT WNL No JVD Chest clear Reg, no M NABS, soft Ext warm, no edema No focal neuro deficits  BMP Latest Ref Rng & Units 11/18/2017 11/17/2017 11/16/2017  Glucose 65 - 99 mg/dL 104(H) 161(H) 155(H)  BUN 6 - 20 mg/dL 18 22(H) 25(H)  Creatinine 0.44 - 1.00 mg/dL 0.58 0.62 0.67  BUN/Creat Ratio 12 - 28 - - -  Sodium 135 - 145 mmol/L 138 133(L) 139  Potassium 3.5 - 5.1 mmol/L 3.5 4.0 3.5  Chloride 101 - 111 mmol/L 104 104 109  CO2 22 - 32 mmol/L 25 18(L) 21(L)  Calcium 8.9 - 10.3 mg/dL 8.2(L) 8.0(L) 8.1(L)    CBC Latest Ref Rng & Units 11/18/2017 11/14/2017 11/13/2017  WBC 3.6 - 11.0 K/uL 7.4 10.1 7.3  Hemoglobin 12.0 - 16.0 g/dL 13.2 13.5 13.2  Hematocrit 35.0 - 47.0 % 39.0 40.0 39.2  Platelets 150 - 440 K/uL 194 230 230   No new CXR   IMPRESSION: Acute respiratory failure, resolving Laryngeal edema, resolving History of depression/anxiety Suspected inadvertent drug overdose -she again denies suicidal intention  PLAN/REC: Transfer to [place] Will leave to primary service decisions regarding resumption of psychiatric medications After transfer, PCCM will sign off. Please call if we can be of further assistance    Merton Border, MD PCCM service Mobile [phone] Pager [phone] 11/18/2017 1:37 PM

## 2017-11-19 DIAGNOSIS — J9601 Acute respiratory failure with hypoxia: Secondary | ICD-10-CM

## 2017-11-19 DIAGNOSIS — R5381 Other malaise: Secondary | ICD-10-CM

## 2017-11-19 MED ORDER — ENSURE ENLIVE PO LIQD
237.0000 mL | Freq: Two times a day (BID) | ORAL | Status: DC
  Administered 2017-11-20 – 2017-11-21 (×4): 237 mL via ORAL

## 2017-11-19 MED ORDER — POTASSIUM CHLORIDE CRYS ER 20 MEQ PO TBCR
40.0000 meq | EXTENDED_RELEASE_TABLET | Freq: Once | ORAL | Status: AC
  Administered 2017-11-19: 40 meq via ORAL
  Filled 2017-11-19: qty 2

## 2017-11-19 MED ORDER — BUDESONIDE 0.25 MG/2ML IN SUSP
0.2500 mg | Freq: Four times a day (QID) | RESPIRATORY_TRACT | Status: DC
  Administered 2017-11-19 – 2017-11-22 (×13): 0.25 mg via RESPIRATORY_TRACT
  Filled 2017-11-19 (×13): qty 2

## 2017-11-19 MED ORDER — IPRATROPIUM-ALBUTEROL 0.5-2.5 (3) MG/3ML IN SOLN
3.0000 mL | Freq: Four times a day (QID) | RESPIRATORY_TRACT | Status: DC
  Administered 2017-11-19 – 2017-11-22 (×13): 3 mL via RESPIRATORY_TRACT
  Filled 2017-11-19 (×13): qty 3

## 2017-11-19 NOTE — Progress Notes (Signed)
Physical Therapy Evaluation Patient Details Name: Kim Buchanan MRN: [id number] DOB: Dec 19, 1950 Today's Date: 11/19/2017   History of Present Illness  Kim Buchanan  is a 67 y.o. female who presents obtunded.  Patient was responsive only to painful stimuli on presentation in the ED.  Patient's husband reports that he feels she may have taken more medications than she was prescribed.  Patient is on baclofen, Klonopin, Elavil, venlafaxine. Her husband states that she typically manages the medication, but that he feels like he has noted her taking extra baclofen tablets in the past.  At some point in the past she had a presentation similar to this, though not as extreme in the ED when she had taken some extra medication.  He denies that she has any intention of self-harm.  In the ED she had to be intubated due to a significantly low respiratory rate and some hypercarbia.  Other workup in the ED is largely benign.  Hospitalist were called for admission and pt is now admitted to the ICU for acute on chronic respiratory failure s/p extubation, laryngeal edema, and acute encephalopathy secondary to possible overdose.   Clinical Impression  Pt admitted with above diagnosis. Pt currently with functional limitations due to the deficits listed below (see PT Problem List). Pt is modified independent for bed mobility. She is mildly unsteady with transfers but able to stabilize without external support from therapist. She demonstrates safe hand placement and does have adequate LE strength to transfer without UE support. She is steady with ambulation using rolling walker. Gait speed is slow. SaO2>90% on 4L/min supplemental O2. Vitals and DOE continuously monitored. Attempted ambulation without walker however pt staggers left and right and requires minA+1 to prevent her from falling. At this time recommend SNF placement for patient at discharge. However with added time she may be able to upgrade discharge plan to  returning home. Will continue to follow. Pt will benefit from PT services to address deficits in strength, balance, and mobility in order to return to full function at home.             Follow Up Recommendations SNF;Other (comment)(Will work toward goal of DC home)    Clinical biochemist with 5" wheels    Recommendations for Other Services       Precautions / Restrictions Precautions Precautions: None Restrictions Weight Bearing Restrictions: No      Mobility  Bed Mobility Overal bed mobility: Modified Independent             General bed mobility comments: Slight increase in time required but able to perform without external assist. HOB elevated and bed rails utilized  Transfers Overall transfer level: Needs assistance Equipment used: Rolling walker (2 wheeled) Transfers: Sit to/from Stand Sit to Stand: Min guard         General transfer comment: Patient is mildly unsteady but able to stabilize without external support from therapist. She demonstrates safe hand placement and does have adequate LE strength to transfer without UE support.   Ambulation/Gait Ambulation/Gait assistance: Min assist Ambulation Distance (Feet): 100 Feet Assistive device: Rolling walker (2 wheeled) Gait Pattern/deviations: Decreased step length - right;Decreased step length - left;Staggering left;Staggering right Gait velocity: Decreased Gait velocity interpretation: <1.8 ft/sec, indicative of risk for recurrent falls General Gait Details: Pt is steady with ambulation using rolling walker. Gait speed is slow. SaO2>90% on 4L/min supplemental O2. Vitals and DOE continuously monitored. Attempted ambulation without walker however pt staggers left and right and requires  minA+1 to prevent her from falling.   Stairs            Wheelchair Mobility    Modified Rankin (Stroke Patients Only)       Balance Overall balance assessment: Needs assistance Sitting-balance  support: No upper extremity supported Sitting balance-Leahy Scale: Good     Standing balance support: No upper extremity supported Standing balance-Leahy Scale: Fair Standing balance comment: Pt able to maintain balance with feet apart and together. Positive Rhomberg. Single leg balance is 2-3 seconds                             Pertinent Vitals/Pain Pain Assessment: No/denies pain    Home Living Family/patient expects to be discharged to:: Private residence Living Arrangements: Spouse/significant other Available Help at Discharge: Family Type of Home: Mobile home Home Access: Stairs to enter Entrance Stairs-Rails: Can reach both Entrance Stairs-Number of Steps: 4 Home Layout: One level Home Equipment: Walker - standard;Cane - single point;Bedside commode;Shower Designer, multimedia)      Prior Function Level of Independence: Independent         Comments: Pt previoulsy independent with ADLs/IADLs. No falls     Hand Dominance        Extremity/Trunk Assessment   Upper Extremity Assessment Upper Extremity Assessment: Overall WFL for tasks assessed    Lower Extremity Assessment Lower Extremity Assessment: Generalized weakness       Communication   Communication: No difficulties  Cognition Arousal/Alertness: Awake/alert Behavior During Therapy: Flat affect Overall Cognitive Status: Impaired/Different from baseline                                 General Comments: Pt is alert and oriented to person and time. Partially oriented to location. She knows that she is in a hospital but thinks she is in [facility] in [place]      Exercises     Assessment/Plan    PT Assessment Patient needs continued PT services  PT Problem List Decreased strength;Decreased balance;Decreased mobility       PT Treatment Interventions DME instruction;Gait training;Therapeutic activities;Therapeutic exercise;Balance  training;Neuromuscular re-education;Patient/family education    PT Goals (Current goals can be found in the Care Plan section)  Acute Rehab PT Goals Patient Stated Goal: Return to prior functional level PT Goal Formulation: With patient Time For Goal Achievement: 12/03/17 Potential to Achieve Goals: Good    Frequency Min 2X/week   Barriers to discharge Inaccessible home environment 4 steps to enter    Co-evaluation               AM-PAC PT "6 Clicks" Daily Activity  Outcome Measure Difficulty turning over in bed (including adjusting bedclothes, sheets and blankets)?: None Difficulty moving from lying on back to sitting on the side of the bed? : A Little Difficulty sitting down on and standing up from a chair with arms (e.g., wheelchair, bedside commode, etc,.)?: A Little Help needed moving to and from a bed to chair (including a wheelchair)?: A Little Help needed walking in hospital room?: A Little Help needed climbing 3-5 steps with a railing? : A Lot 6 Click Score: 18    End of Session Equipment Utilized During Treatment: Gait belt Activity Tolerance: Patient tolerated treatment well Patient left: with nursing/sitter in room;Other (comment)(On BSC with CNA) Nurse Communication: Mobility status PT Visit Diagnosis:  Unsteadiness on feet (R26.81);Muscle weakness (generalized) (M62.81);Difficulty in walking, not elsewhere classified (R26.2)    Time: 8421-0312 PT Time Calculation (min) (ACUTE ONLY): 25 min   Charges:   PT Evaluation $PT Eval Low Complexity: 1 Low PT Treatments $Gait Training: 8-22 mins   PT G Codes:        Lyndel Safe Damontae Loppnow PT, DPT    Toniyah Dilmore 11/19/2017, 4:41 PM

## 2017-11-19 NOTE — Progress Notes (Signed)
[place] at [place] NAME: Kim Buchanan    MR#:  [id number]  DATE OF BIRTH:  11/15/1950  SUBJECTIVE:  CHIEF COMPLAINT:   Chief Complaint  Patient presents with  . Drug Overdose  . Altered Mental Status    Patient requiring BiPAP again due to respiratory difficulties   REVIEW OF SYSTEMS:  Review of Systems  Unable to perform ROS: Acuity of condition   DRUG ALLERGIES:  No Known Allergies VITALS:  Blood pressure 111/63, pulse 89, temperature 98.8 F (37.1 C), temperature source Oral, resp. rate 19, height 5\' 4"  (1.626 m), weight 160 lb 4.4 oz (72.7 kg), SpO2 93 %. PHYSICAL EXAMINATION:   GENERAL: Back on BiPAP mild respiratory distress HEAD, EYES, EARS, NOSE AND THROAT: Atraumatic, normocephalic. Extraocular muscles are intact. Pupils equal and reactive to light. Sclerae anicteric. No conjunctival injection. No oro-pharyngeal erythema.  NECK: Supple. There is no jugular venous distention. No bruits, no lymphadenopathy, no thyromegaly.  HEART: Regular rate and rhythm, tachycardic. No murmurs, no rubs, no clicks.  LUNGS: Clear to auscultation bilaterally. No rales or rhonchi. No wheezes.  ABDOMEN: Soft, flat, nontender, nondistended. Has good bowel sounds. No hepatosplenomegaly appreciated.  EXTREMITIES: No evidence of any cyanosis, clubbing, or peripheral edema.  +2 pedal and radial pulses bilaterally.  SKIN: Moist and warm with no rashes appreciated.  MUSCULOSKELETAL: denies any joint swelling, gout LYMPHATIC: No cervical or axillary lymphadenopathy.  NEUROLOGIC: confused PYCH: Not anxious or depressed  LABORATORY PANEL:  Female CBC Recent Labs  Lab 11/18/17 0457  WBC 7.4  HGB 13.2  HCT 39.0  PLT 194   ------------------------------------------------------------------------------------------------------------------ Chemistries  Recent Labs  Lab 11/13/17 1853  11/17/17 0504 11/18/17 0457  NA 142   < > 133* 138  K  3.9   < > 4.0 3.5  CL 110   < > 104 104  CO2 25   < > 18* 25  GLUCOSE 94   < > 161* 104*  BUN 21*   < > 22* 18  CREATININE 0.87   < > 0.62 0.58  CALCIUM 8.3*   < > 8.0* 8.2*  MG  --    < > 2.4  --   AST 19  --   --   --   ALT 18  --   --   --   ALKPHOS 94  --   --   --   BILITOT 0.5  --   --   --    < > = values in this interval not displayed.   RADIOLOGY:  No results found. ASSESSMENT AND PLAN:  67 yo female admitted with acute encephalopathy likely secondary to accidental vs. unintentional drug overdose requiring mechanical intubation for airway protection  * Acute respiratory failure with hypercapnia (HCC) due to possible overdose S/p extubation Back on BiPAP Patient started on IV steroids as well as nebulizer therapy Repeat chest x-ray in the morning  * Depression, major, recurrent (HCC) -holding medications at this time, psych eval once patient more awake  * Generalized anxiety disorder -holding home meds seen by PT  * GERD (gastroesophageal reflux disease) -continue IV pepcid       All the records are reviewed and case discussed with Care Management/Social Worker. Management plans discussed with the patient, Dr Wylie Hail and they are in agreement.  CODE STATUS: Full Code  TOTAL TIME TAKING CARE OF THIS PATIENT: 15 minutes.   More than 50% of the time was spent in  counseling/coordination of care: Tawni Levy M.D on 11/19/2017 at 5:51 PM  Between 7am to 6pm - Pager - [phone]  After 6pm go to [email or web address] - password [email or web address] [facility]  [employer] [facility] [place] [facility]  Office  [phone]  CC: Primary care physician; Mikey College, NP  Note: This dictation was prepared with Dragon dictation along with smaller phrase technology. Any transcriptional errors that result from this process are unintentional.

## 2017-11-19 NOTE — Progress Notes (Addendum)
Developed mild confusion and increased work of breathing last night.  Placed on BiPAP.  Presently comfortable on [place] O2.  Denies dyspnea.  Denies pain.  No new complaints  Vitals:   11/19/17 0800 11/19/17 0900 11/19/17 1000 11/19/17 1100  BP: 130/68 (!) 141/119 107/66 (!) 93/46  Pulse: 64 69 69 85  Resp: (!) 23 (!) 31 (!) 22 (!) 23  Temp:   98.9 F (37.2 C)   TempSrc:   Oral   SpO2: 93% 90% 91% 90%  Weight:      Height:        NAD HEENT WNL No JVD Few scattered wheezes Reg, no M NABS, soft Ext warm, no edema No focal neuro deficits  BMP Latest Ref Rng & Units 11/18/2017 11/17/2017 11/16/2017  Glucose 65 - 99 mg/dL 104(H) 161(H) 155(H)  BUN 6 - 20 mg/dL 18 22(H) 25(H)  Creatinine 0.44 - 1.00 mg/dL 0.58 0.62 0.67  BUN/Creat Ratio 12 - 28 - - -  Sodium 135 - 145 mmol/L 138 133(L) 139  Potassium 3.5 - 5.1 mmol/L 3.5 4.0 3.5  Chloride 101 - 111 mmol/L 104 104 109  CO2 22 - 32 mmol/L 25 18(L) 21(L)  Calcium 8.9 - 10.3 mg/dL 8.2(L) 8.0(L) 8.1(L)    CBC Latest Ref Rng & Units 11/18/2017 11/14/2017 11/13/2017  WBC 3.6 - 11.0 K/uL 7.4 10.1 7.3  Hemoglobin 12.0 - 16.0 g/dL 13.2 13.5 13.2  Hematocrit 35.0 - 47.0 % 39.0 40.0 39.2  Platelets 150 - 440 K/uL 194 230 230    No new CXR  IMPRESSION: Acute on chronic respiratory failure, slowly approaching baseline Laryngeal edema, resolved Deconditioning  PLAN/REC: Since she required BiPAP last night, we will cancel her transfer order and watch her in SDU through today I have reordered scheduled nebulized steroids and bronchodilators Continue supplemental oxygen as needed Continue BiPAP as needed PT evaluation ordered Advance diet and activity as tolerated Recheck CXR in AM 1/30  Merton Border, MD PCCM service Mobile [phone] Pager [phone] 11/19/2017 1:07 PM

## 2017-11-19 NOTE — Progress Notes (Signed)
Only IV leaking. Discontinued and IV team notified.

## 2017-11-19 NOTE — Progress Notes (Signed)
Transfer orders d/c by Merton Border, MD

## 2017-11-20 ENCOUNTER — Inpatient Hospital Stay: Payer: Medicare Other

## 2017-11-20 DIAGNOSIS — F411 Generalized anxiety disorder: Secondary | ICD-10-CM

## 2017-11-20 DIAGNOSIS — T4271XA Poisoning by unspecified antiepileptic and sedative-hypnotic drugs, accidental (unintentional), initial encounter: Secondary | ICD-10-CM

## 2017-11-20 LAB — BASIC METABOLIC PANEL
Anion gap: 9 (ref 5–15)
BUN: 12 mg/dL (ref 6–20)
CO2: 24 mmol/L (ref 22–32)
Calcium: 7.7 mg/dL — ABNORMAL LOW (ref 8.9–10.3)
Chloride: 99 mmol/L — ABNORMAL LOW (ref 101–111)
Creatinine, Ser: 0.58 mg/dL (ref 0.44–1.00)
GFR calc Af Amer: >60 mL/min (ref >60)
GFR calc non Af Amer: >60 mL/min (ref >60)
Glucose, Bld: 88 mg/dL (ref 65–99)
Potassium: 3.3 mmol/L — ABNORMAL LOW (ref 3.5–5.1)
Sodium: 132 mmol/L — ABNORMAL LOW (ref 135–145)

## 2017-11-20 MED ORDER — POTASSIUM CHLORIDE CRYS ER 20 MEQ PO TBCR
40.0000 meq | EXTENDED_RELEASE_TABLET | Freq: Once | ORAL | Status: AC
  Administered 2017-11-20: 40 meq via ORAL
  Filled 2017-11-20: qty 2

## 2017-11-20 MED ORDER — ACETYLCYSTEINE 20 % IN SOLN
600.0000 mg | Freq: Two times a day (BID) | RESPIRATORY_TRACT | Status: DC
  Administered 2017-11-20: 20:00:00 600 mg via ORAL
  Filled 2017-11-20 (×2): qty 4

## 2017-11-20 MED ORDER — POTASSIUM CHLORIDE 20 MEQ PO PACK
60.0000 meq | PACK | Freq: Once | ORAL | Status: AC
  Administered 2017-11-20: 60 meq via ORAL
  Filled 2017-11-20: qty 3

## 2017-11-20 MED ORDER — DOXYCYCLINE HYCLATE 100 MG PO TABS
100.0000 mg | ORAL_TABLET | Freq: Two times a day (BID) | ORAL | Status: DC
  Administered 2017-11-20 – 2017-11-21 (×2): 100 mg via ORAL
  Filled 2017-11-20 (×4): qty 1

## 2017-11-20 MED ORDER — GUAIFENESIN ER 600 MG PO TB12
600.0000 mg | ORAL_TABLET | Freq: Two times a day (BID) | ORAL | Status: DC
  Administered 2017-11-20 – 2017-11-22 (×4): 600 mg via ORAL
  Filled 2017-11-20 (×4): qty 1

## 2017-11-20 NOTE — Consult Note (Signed)
[facility] Consult   Reason for Consult: Consult for 67 year old woman who has been in the hospital for several days after coming in a obtunded.  Concern about overdose Referring Physician: Posey Pronto Patient Identification: Kim Buchanan MRN:  [id number] Principal Diagnosis: Generalized anxiety disorder Diagnosis:   Patient Active Problem List   Diagnosis Date Noted  . Sedative or hypnotic overdose [T42.71XA] 11/20/2017  . Obtunded [R40.1] 11/13/2017  . Acute respiratory failure with hypercapnia ([place]) [J96.02] 11/13/2017  . Psychophysiological insomnia [F51.04] 11/06/2017  . Generalized anxiety disorder [F41.1] 10/20/2017  . Hyperlipidemia [E78.5] 07/19/2017  . S/P vaginal hysterectomy [Z90.710] 12/17/2016  . Vaginal atrophy [N95.2] 10/31/2016  . Hx of colonic polyps [Z86.010]   . Benign neoplasm of transverse colon [D12.3]   . Benign neoplasm of descending colon [D12.4]   . Polyp of sigmoid colon [D12.5]   . Difficulty urinating [R39.198] 08/31/2016  . DDD (degenerative disc disease), lumbar [M51.36] 02/21/2016  . GERD (gastroesophageal reflux disease) [K21.9] 02/14/2016  . Low back pain [M54.5] 02/14/2016  . Prolapsed external hemorrhoids [K64.4] 06/06/2015  . Anxiety and depression [F41.9, F32.9] 06/06/2015  . Depression, major, recurrent ([place]) [F33.9] 06/06/2015  . Panic attack [F41.0] 06/06/2015  . Compulsive tobacco user syndrome [F17.200] 06/06/2015    Total Time spent with patient: 1 hour  Subjective:   Kim Buchanan is a 67 y.o. female patient admitted with "I do not remember".  HPI: Patient interviewed chart reviewed.  Her husband was also present and gave some information.  67 year old woman came in the hospital about a week ago with acute altered mental status.  On interview today the patient reports she has no memory of coming into the hospital.  The husband reports that the patient had seemed to be different in her mental status for a few hours  and then suddenly started to slump over and looked like she was going to fall.  He caught her before she fell and then she passed out so he called 911.  Patient had to be intubated in the hospital and was on a ventilator for several days.  Patient says she does not remember the events that brought her into the hospital.  She admits to having a chronic anxiety disorder but denies having been feeling more anxious or being depressed recently.  Specifically, she denies that she had taken any extra doses of any medicine that she is prescribed or having taken any medicines that she is not prescribed or any other drugs.  She complains to me that her primary care doctor had recently been cutting down her clonazepam dose but denies that she had tried to supplement it and when I tried to clarify whether the lower Klonopin dose was causing any problems she claimed to not remember that either.  Neither the patient nor her husband seem to have any concern that the patient had intentionally tried to hurt her self.  Patient absolutely denies any suicidal ideation.  Social history: Patient and her husband live together.  She does not work outside the home.  Chronically disabled with  Medical history: Recovering now from having been intubated for several days.  At home she suffers from sleep apnea although she no longer has a CPAP machine because of insurance difficulties.  Substance abuse history: Patient reports that years ago she drank alcohol but has not had any alcohol or used any other drugs in a long time.  Denies completely that she is been misusing or abusing any of her medicine.  Looking back over her controlled substance database she has been on standing doses of clonazepam for a while and recently the dose is being cut down as documented in her doctor's notes.  Has not had any pain medicine in a couple of months.  Past Psychiatric History: Denies any psychiatric hospitalization.  Denies any history of suicide  attempts.  Denies any history of psychosis.  She has a history of generalized anxiety disorder and is prescribed Effexor 225 mg/day and has been on clonazepam at a steady dose for a while.  Risk to Self: Is patient at risk for suicide?: No Risk to Others:   Prior Inpatient Therapy:   Prior Outpatient Therapy:    Past Medical History:  Past Medical History:  Diagnosis Date  . Anxiety   . Arthritis    lower back  . Depression   . GERD (gastroesophageal reflux disease)   . Hyperlipidemia   . Leaky heart valve    sees Dr Humphrey Rolls  . Panic attack   . PTSD (post-traumatic stress disorder)   . Rectal fistula   . Sleep apnea   . Wears dentures    full upper    Past Surgical History:  Procedure Laterality Date  . ABDOMINAL HYSTERECTOMY  10/2015  . COLONOSCOPY  2014  . COLONOSCOPY WITH PROPOFOL N/A 10/01/2016   Procedure: COLONOSCOPY WITH PROPOFOL;  Surgeon: Lucilla Lame, MD;  Location: [facility];  Service: Endoscopy;  Laterality: N/A;  . CYSTOCELE REPAIR N/A 12/17/2016   Procedure: ANTERIOR REPAIR (CYSTOCELE);  Surgeon: Brayton Mars, MD;  Location: [facility];  Service: Gynecology;  Laterality: N/A;  . POLYPECTOMY  10/01/2016   Procedure: POLYPECTOMY;  Surgeon: Lucilla Lame, MD;  Location: [facility];  Service: Endoscopy;;  . RECTAL SURGERY  06/28/2015   Rectal prolapse, laparoscopic rectopexy the coldCharmont, MD; [facility]  . TUBAL LIGATION    . VAGINAL HYSTERECTOMY Bilateral 12/17/2016   Procedure: HYSTERECTOMY VAGINAL WITH BILATERAL SALPINGO OOPHERECTOMY;  Surgeon: Brayton Mars, MD;  Location: [facility];  Service: Gynecology;  Laterality: Bilateral;   Family History:  Family History  Problem Relation Age of Onset  . Diabetes Mother   . Diabetes Sister   . Diabetes Brother   . Lymphoma Brother   . Heart disease Father   . Diabetes Sister   . Diabetes Brother   . Lymphoma Brother   . Heart disease Brother   . Healthy Sister   . Breast cancer Neg Hx   .  Ovarian cancer Neg Hx   . Colon cancer Neg Hx    Family Psychiatric  History: Denies any Social History:  Social History   Substance and Sexual Activity  Alcohol Use No  . Alcohol/week: 0.0 oz     Social History   Substance and Sexual Activity  Drug Use No    Social History   Socioeconomic History  . Marital status: Married    Spouse name: None  . Number of children: None  . Years of education: None  . Highest education level: None  Social Needs  . Financial resource strain: None  . Food insecurity - worry: None  . Food insecurity - inability: None  . Transportation needs - medical: None  . Transportation needs - non-medical: None  Occupational History  . None  Tobacco Use  . Smoking status: Current Every Day Smoker    Packs/day: 0.50    Years: 45.00    Pack years: 22.50    Types: Cigarettes  . Smokeless tobacco:  Never Used  . Tobacco comment: started at age 76  Substance and Sexual Activity  . Alcohol use: No    Alcohol/week: 0.0 oz  . Drug use: No  . Sexual activity: Not Currently  Other Topics Concern  . None  Social History Narrative  . None   Additional Social History:    Allergies:  No Known Allergies  Labs:  Results for orders placed or performed during the hospital encounter of 11/13/17 (from the past 48 hour(s))  Basic metabolic panel     Status: Abnormal   Collection Time: 11/20/17  4:53 AM  Result Value Ref Range   Sodium 132 (L) 135 - 145 mmol/L   Potassium 3.3 (L) 3.5 - 5.1 mmol/L   Chloride 99 (L) 101 - 111 mmol/L   CO2 24 22 - 32 mmol/L   Glucose, Bld 88 65 - 99 mg/dL   BUN 12 6 - 20 mg/dL   Creatinine, Ser 0.58 0.44 - 1.00 mg/dL   Calcium 7.7 (L) 8.9 - 10.3 mg/dL   GFR calc non Af Amer >60 >60 mL/min   GFR calc Af Amer >60 >60 mL/min    Comment: (NOTE) The eGFR has been calculated using the CKD EPI equation. This calculation has not been validated in all clinical situations. eGFR's persistently <60 mL/min signify possible  Chronic Kidney Disease.    Anion gap 9 5 - 15    Comment: Performed at [facility], [address] [place], [place] [address]    Current Facility-Administered Medications  Medication Dose Route Frequency Provider Last Rate Last Dose  . 0.9 %  sodium chloride infusion  250 mL Intravenous PRN Lance Coon, MD      . acetaminophen (TYLENOL) tablet 650 mg  650 mg Oral Q4H PRN Lance Coon, MD      . acetylcysteine (MUCOMYST) 20 % nebulizer / oral solution 600 mg  600 mg Oral BID Dustin Flock, MD      . budesonide (PULMICORT) nebulizer solution 0.25 mg  0.25 mg Nebulization Q6H Wilhelmina Mcardle, MD   0.25 mg at 11/20/17 1328  . clonazePAM (KLONOPIN) tablet 0.5 mg  0.5 mg Oral QHS PRN Dorene Sorrow S, NP   0.5 mg at 11/18/17 2234  . doxycycline (VIBRA-TABS) tablet 100 mg  100 mg Oral Q12H Dustin Flock, MD      . enoxaparin (LOVENOX) injection 40 mg  40 mg Subcutaneous Q24H Lance Coon, MD   40 mg at 11/19/17 2135  . feeding supplement (ENSURE ENLIVE) (ENSURE ENLIVE) liquid 237 mL  237 mL Oral BID BM Wilhelmina Mcardle, MD   237 mL at 11/20/17 1249  . guaiFENesin (MUCINEX) 12 hr tablet 600 mg  600 mg Oral BID Dustin Flock, MD      . ipratropium-albuterol (DUONEB) 0.5-2.5 (3) MG/3ML nebulizer solution 3 mL  3 mL Nebulization Q6H Wilhelmina Mcardle, MD   3 mL at 11/20/17 1328  . MEDLINE mouth rinse  15 mL Mouth Rinse BID Conforti, Kier Smead, DO   15 mL at 11/20/17 1250  . ondansetron (ZOFRAN) injection 4 mg  4 mg Intravenous Q6H PRN Lance Coon, MD      . phenol (CHLORASEPTIC) mouth spray 1 spray  1 spray Mouth/Throat PRN Awilda Bill, NP      . polyethylene glycol (MIRALAX / GLYCOLAX) packet 17 g  17 g Per Tube Daily Conforti, Shayann Garbutt, DO   17 g at 11/20/17 1125  . venlafaxine XR (EFFEXOR-XR) 24 hr capsule 150 mg  150 mg Oral QHS Dorene Sorrow S, NP   150 mg at 11/19/17 2135  . venlafaxine XR (EFFEXOR-XR) 24 hr capsule 75 mg  75 mg Oral QHS Awilda Bill, NP   75 mg at  11/19/17 2135    Musculoskeletal: Strength & Muscle Tone: decreased Gait & Station: unsteady Patient leans: N/A  Psychiatric Specialty Exam: Physical Exam  Nursing note and vitals reviewed. Constitutional: She appears well-developed and well-nourished.  HENT:  Head: Normocephalic and atraumatic.  Eyes: Conjunctivae are normal. Pupils are equal, round, and reactive to light.  Neck: Normal range of motion.  Cardiovascular: Regular rhythm and normal heart sounds.  Respiratory: She is in respiratory distress.  GI: Soft.  Musculoskeletal: Normal range of motion.  Neurological: She is alert.  Skin: Skin is warm and dry.  Psychiatric: Judgment normal. Her affect is blunt. Her speech is delayed. She is slowed. Thought content is not paranoid and not delusional. She expresses no homicidal and no suicidal ideation. She exhibits abnormal recent memory.    Review of Systems  Constitutional: Negative.   HENT: Negative.   Eyes: Negative.   Respiratory: Negative.   Cardiovascular: Negative.   Gastrointestinal: Negative.   Musculoskeletal: Negative.   Skin: Negative.   Neurological: Negative.   Psychiatric/Behavioral: Positive for memory loss. Negative for depression, hallucinations, substance abuse and suicidal ideas. The patient is nervous/anxious. The patient does not have insomnia.     Blood pressure (!) 122/55, pulse 98, temperature (!) 97.5 F (36.4 C), temperature source Oral, resp. rate 18, height '5\' 2"'  (1.575 m), weight 73.1 kg (161 lb 2.5 oz), SpO2 96 %.Body mass index is 29.48 kg/m.  General Appearance: Casual  Eye Contact:  Good  Speech:  Slow  Volume:  Decreased  Mood:  Euthymic  Affect:  Constricted  Thought Process:  Goal Directed  Orientation:  Full (Time, Place, and Person)  Thought Content:  Logical  Suicidal Thoughts:  No  Homicidal Thoughts:  No  Memory:  Immediate;   Good Recent;   Fair Remote;   Fair  Judgement:  Fair  Insight:  Shallow  Psychomotor  Activity:  Decreased  Concentration:  Concentration: Fair  Recall:  [employer] of Knowledge:  Fair  Language:  Fair  Akathisia:  No  Handed:  Right  AIMS (if indicated):     Assets:  Desire for Improvement Housing Resilience Social Support  ADL's:  Intact  Cognition:  Impaired,  Mild  Sleep:        Treatment Plan Summary: Daily contact with patient to assess and evaluate symptoms and progress in treatment, Medication management and Plan 66 year old woman who came to the hospital a week ago with altered mental status.  Just now off the ventilator.  Speculation had been that she could have intentionally overdosed.  Patient absolutely denies this.  Husband also denies it and does not think that she had intentionally overdosed.  Neither 1 of them describe any recent depression or suicidal ideation.  Patient is currently euthymic not psychotic not confused particularly and denies any suicidal thoughts.  Drug screen on admission was positive for benzodiazepines.  I could speculate on the possibility that she could have taken some extra benzodiazepines on this basis given that clonazepam usually does not show up on our drug screen but the patient is completely denying it.  In any case I think there is no evidence that she is suicidal or dangerous to herself.  Spent some time encouraging her to talk with her doctor  about her anxiety treatment and not to have her take it into her own hands to take extra medicine for her nerves.  No indication for any change to medicine or further intervention.  She can follow-up as usual with her outpatient primary care doctor when she is discharged.  Disposition: No evidence of imminent risk to self or others at present.   Patient does not meet criteria for psychiatric inpatient admission. Supportive therapy provided about ongoing stressors.  Alethia Berthold, MD 11/20/2017 4:31 PM

## 2017-11-20 NOTE — Progress Notes (Signed)
[facility] [place] NAME: Kim Buchanan    MR#:  [id number]  DATE OF BIRTH:  [birth year]  SUBJECTIVE:  CHIEF COMPLAINT:   Chief Complaint  Patient presents with  . Drug Overdose  . Altered Mental Status   Breathing improved   REVIEW OF SYSTEMS:   CONSTITUTIONAL: No documented fever. No fatigue, weakness. No weight gain, no weight loss.  EYES: No blurry or double vision.  ENT: No tinnitus. No postnasal drip. No redness of the oropharynx.  RESPIRATORY: No cough, no wheeze, no hemoptysis. No dyspnea.  CARDIOVASCULAR: No chest pain. No orthopnea. No palpitations. No syncope.  GASTROINTESTINAL: No nausea, no vomiting or diarrhea. No abdominal pain. No melena or hematochezia.  GENITOURINARY:  No urgency. No frequency. No dysuria. No hematuria. No obstructive symptoms. No discharge. No pain. No significant abnormal bleeding ENDOCRINE: No polyuria or nocturia. No heat or cold intolerance.  HEMATOLOGY: No anemia. No bruising. No bleeding. No purpura. No petechiae INTEGUMENTARY: No rashes. No lesions.  MUSCULOSKELETAL: No arthritis. No swelling. No gout.  NEUROLOGIC: No numbness, tingling, or ataxia. No seizure-type activity.  PSYCHIATRIC: No anxiety. No insomnia. No ADD.        DRUG ALLERGIES:  No Known Allergies VITALS:  Blood pressure (!) 122/55, pulse 98, temperature (!) 97.5 F (36.4 C), temperature source Oral, resp. rate 18, height 5\' 2"  (1.575 m), weight 161 lb 2.5 oz (73.1 kg), SpO2 98 %. PHYSICAL EXAMINATION:   GENERAL: No distress HEAD, EYES, EARS, NOSE AND THROAT: Atraumatic, normocephalic. Extraocular muscles are intact. Pupils equal and reactive to light. Sclerae anicteric. No conjunctival injection. No oro-pharyngeal erythema.  NECK: Supple. There is no jugular venous distention. No bruits, no lymphadenopathy, no thyromegaly.  HEART: Regular rate and rhythm, tachycardic. No murmurs, no rubs, no clicks.  LUNGS: Clear  to auscultation bilaterally. No rales or rhonchi. No wheezes.  ABDOMEN: Soft, flat, nontender, nondistended. Has good bowel sounds. No hepatosplenomegaly appreciated.  EXTREMITIES: No evidence of any cyanosis, clubbing, or peripheral edema.  +2 pedal and radial pulses bilaterally.  SKIN: Moist and warm with no rashes appreciated.  MUSCULOSKELETAL: denies any joint swelling, gout LYMPHATIC: No cervical or axillary lymphadenopathy.  NEUROLOGIC: confused PYCH: Not anxious or depressed  LABORATORY PANEL:  Female CBC Recent Labs  Lab 11/18/17 0457  WBC 7.4  HGB 13.2  HCT 39.0  PLT 194   ------------------------------------------------------------------------------------------------------------------ Chemistries  Recent Labs  Lab 11/13/17 1853  11/17/17 0504  11/20/17 0453  NA 142   < > 133*   < > 132*  K 3.9   < > 4.0   < > 3.3*  CL 110   < > 104   < > 99*  CO2 25   < > 18*   < > 24  GLUCOSE 94   < > 161*   < > 88  BUN 21*   < > 22*   < > 12  CREATININE 0.87   < > 0.62   < > 0.58  CALCIUM 8.3*   < > 8.0*   < > 7.7*  MG  --    < > 2.4  --   --   AST 19  --   --   --   --   ALT 18  --   --   --   --   ALKPHOS 94  --   --   --   --   BILITOT 0.5  --   --   --   --    < > =  values in this interval not displayed.   RADIOLOGY:  Dg Chest Port 1 View  Result Date: 11/20/2017 CLINICAL DATA:  67 year old female with respiratory failure. EXAM: PORTABLE CHEST 1 VIEW COMPARISON:  11/17/2017 and earlier. FINDINGS: Portable AP upright view at 0439 hrs. Extubated and enteric tube removed. Stable to mildly improved lung volumes. Improved bibasilar ventilation. Normal cardiac size and mediastinal contours. Small volume of pleural fluid along the right minor fissure. Patchy residual right hilar opacity. No pneumothorax or pulmonary edema. IMPRESSION: 1. Extubated and enteric tube removed. 2. Improved ventilation in both lungs and decreased right pleural effusion. Electronically Signed   By: Genevie Ann M.D.   On: 11/20/2017 06:45   ASSESSMENT AND PLAN:  67 yo female admitted with acute encephalopathy likely secondary to accidental vs. unintentional drug overdose requiring mechanical intubation for airway protection  * Acute respiratory failure with hypercapnia (HCC) due to possible overdose S/p extubation Currently on room air Continue nebulizer therapy Continue Pulmicort We will add incentive spirometer Mucinex Mucomyst to current therapy Oral doxy for possible bronchitis  * Depression, major, recurrent (HCC) -psych eval  * Generalized anxiety disorder -holding home meds seen by PT  * GERD (gastroesophageal reflux disease) -continue IV pepcid       All the records are reviewed and case discussed with Care Management/Social Worker. Management plans discussed with the patient, Dr Wylie Hail and they are in agreement.  CODE STATUS: Full Code  TOTAL TIME TAKING CARE OF THIS PATIENT: 15 minutes.   More than 50% of the time was spent in counseling/coordination of care: Tawni Levy M.D on 11/20/2017 at 1:27 PM  Between 7am to 6pm - Pager - [phone]  After 6pm go to [email or web address] - password [email or web address] [facility]  [facility]  Office  [phone]  CC: Primary care physician; Mikey College, NP  Note: This dictation was prepared with Dragon dictation along with smaller phrase technology. Any transcriptional errors that result from this process are unintentional.

## 2017-11-21 LAB — BASIC METABOLIC PANEL
Anion gap: 10 (ref 5–15)
BUN: 17 mg/dL (ref 6–20)
CO2: 23 mmol/L (ref 22–32)
Calcium: 8.6 mg/dL — ABNORMAL LOW (ref 8.9–10.3)
Chloride: 104 mmol/L (ref 101–111)
Creatinine, Ser: 0.77 mg/dL (ref 0.44–1.00)
GFR calc Af Amer: >60 mL/min (ref >60)
GFR calc non Af Amer: >60 mL/min (ref >60)
Glucose, Bld: 116 mg/dL — ABNORMAL HIGH (ref 65–99)
Potassium: 4.8 mmol/L (ref 3.5–5.1)
Sodium: 137 mmol/L (ref 135–145)

## 2017-11-21 MED ORDER — LEVOFLOXACIN IN D5W 500 MG/100ML IV SOLN
500.0000 mg | INTRAVENOUS | Status: DC
  Administered 2017-11-21: 500 mg via INTRAVENOUS
  Filled 2017-11-21 (×2): qty 100

## 2017-11-21 MED ORDER — AMITRIPTYLINE HCL 10 MG PO TABS
10.0000 mg | ORAL_TABLET | Freq: Every day | ORAL | Status: DC
  Administered 2017-11-21: 10 mg via ORAL
  Filled 2017-11-21 (×2): qty 1

## 2017-11-21 MED ORDER — ATORVASTATIN CALCIUM 20 MG PO TABS
20.0000 mg | ORAL_TABLET | Freq: Every day | ORAL | Status: DC
  Administered 2017-11-21: 17:00:00 20 mg via ORAL
  Filled 2017-11-21: qty 1

## 2017-11-21 MED ORDER — ISOSORBIDE DINITRATE 30 MG PO TABS
30.0000 mg | ORAL_TABLET | Freq: Every day | ORAL | Status: DC
  Administered 2017-11-21 – 2017-11-22 (×2): 30 mg via ORAL
  Filled 2017-11-21 (×2): qty 1

## 2017-11-21 MED ORDER — ACETYLCYSTEINE 20 % IN SOLN
3.0000 mL | Freq: Two times a day (BID) | RESPIRATORY_TRACT | Status: AC
  Administered 2017-11-21: 3 mL via RESPIRATORY_TRACT
  Filled 2017-11-21: qty 4

## 2017-11-21 MED ORDER — VENLAFAXINE HCL ER 75 MG PO CP24
225.0000 mg | ORAL_CAPSULE | Freq: Every day | ORAL | Status: DC
  Administered 2017-11-21: 21:00:00 225 mg via ORAL
  Filled 2017-11-21: qty 3

## 2017-11-21 MED ORDER — METHYLPREDNISOLONE SODIUM SUCC 125 MG IJ SOLR
60.0000 mg | Freq: Four times a day (QID) | INTRAMUSCULAR | Status: DC
  Administered 2017-11-21 – 2017-11-22 (×5): 60 mg via INTRAVENOUS
  Filled 2017-11-21 (×6): qty 2

## 2017-11-21 MED ORDER — ASPIRIN EC 81 MG PO TBEC
81.0000 mg | DELAYED_RELEASE_TABLET | Freq: Every day | ORAL | Status: DC
  Administered 2017-11-21 – 2017-11-22 (×2): 81 mg via ORAL
  Filled 2017-11-21 (×2): qty 1

## 2017-11-21 NOTE — Consult Note (Signed)
[facility] Consult   Reason for Consult: Follow-up consult for 67 year old woman with recent altered mental status Referring Physician: Posey Pronto Patient Identification: Kim Buchanan MRN:  [id number] Principal Diagnosis: Generalized anxiety disorder Diagnosis:   Patient Active Problem List   Diagnosis Date Noted  . Sedative or hypnotic overdose [T42.71XA] 11/20/2017  . Obtunded [R40.1] 11/13/2017  . Acute respiratory failure with hypercapnia ([place]) [J96.02] 11/13/2017  . Psychophysiological insomnia [F51.04] 11/06/2017  . Generalized anxiety disorder [F41.1] 10/20/2017  . Hyperlipidemia [E78.5] 07/19/2017  . S/P vaginal hysterectomy [Z90.710] 12/17/2016  . Vaginal atrophy [N95.2] 10/31/2016  . Hx of colonic polyps [Z86.010]   . Benign neoplasm of transverse colon [D12.3]   . Benign neoplasm of descending colon [D12.4]   . Polyp of sigmoid colon [D12.5]   . Difficulty urinating [R39.198] 08/31/2016  . DDD (degenerative disc disease), lumbar [M51.36] 02/21/2016  . GERD (gastroesophageal reflux disease) [K21.9] 02/14/2016  . Low back pain [M54.5] 02/14/2016  . Prolapsed external hemorrhoids [K64.4] 06/06/2015  . Anxiety and depression [F41.9, F32.9] 06/06/2015  . Depression, major, recurrent ([place]) [F33.9] 06/06/2015  . Panic attack [F41.0] 06/06/2015  . Compulsive tobacco user syndrome [F17.200] 06/06/2015    Total Time spent with patient: 20 minutes  Subjective:   Kim Buchanan is a 67 y.o. female patient admitted with "I am doing fine".  HPI: Follow-up on yesterday's initial consult.  Patient has no new complaints.  She is still on oxygen but says that she has been able to get out of bed and ambulate without difficulty today.  Denies any psychiatric symptoms.  Reports that her mood is good.  She appears to be alert and oriented.  Husband appears to believe that she is at her baseline.  Patient continues to deny any suicidality and absolutely denied that she  intentionally overdosed.  Past Psychiatric History: History of chronic anxiety and depression  Risk to Self: Is patient at risk for suicide?: No Risk to Others:   Prior Inpatient Therapy:   Prior Outpatient Therapy:    Past Medical History:  Past Medical History:  Diagnosis Date  . Anxiety   . Arthritis    lower back  . Depression   . GERD (gastroesophageal reflux disease)   . Hyperlipidemia   . Leaky heart valve    sees Dr Humphrey Rolls  . Panic attack   . PTSD (post-traumatic stress disorder)   . Rectal fistula   . Sleep apnea   . Wears dentures    full upper    Past Surgical History:  Procedure Laterality Date  . ABDOMINAL HYSTERECTOMY  10/2015  . COLONOSCOPY  2014  . COLONOSCOPY WITH PROPOFOL N/A 10/01/2016   Procedure: COLONOSCOPY WITH PROPOFOL;  Surgeon: Lucilla Lame, MD;  Location: [facility];  Service: Endoscopy;  Laterality: N/A;  . CYSTOCELE REPAIR N/A 12/17/2016   Procedure: ANTERIOR REPAIR (CYSTOCELE);  Surgeon: Brayton Mars, MD;  Location: [facility];  Service: Gynecology;  Laterality: N/A;  . POLYPECTOMY  10/01/2016   Procedure: POLYPECTOMY;  Surgeon: Lucilla Lame, MD;  Location: [facility];  Service: Endoscopy;;  . RECTAL SURGERY  06/28/2015   Rectal prolapse, laparoscopic rectopexy the coldCharmont, MD; [facility]  . TUBAL LIGATION    . VAGINAL HYSTERECTOMY Bilateral 12/17/2016   Procedure: HYSTERECTOMY VAGINAL WITH BILATERAL SALPINGO OOPHERECTOMY;  Surgeon: Brayton Mars, MD;  Location: [facility];  Service: Gynecology;  Laterality: Bilateral;   Family History:  Family History  Problem Relation Age of Onset  . Diabetes Mother   .  Diabetes Sister   . Diabetes Brother   . Lymphoma Brother   . Heart disease Father   . Diabetes Sister   . Diabetes Brother   . Lymphoma Brother   . Heart disease Brother   . Healthy Sister   . Breast cancer Neg Hx   . Ovarian cancer Neg Hx   . Colon cancer Neg Hx    Family Psychiatric  History:  Unknown Social History:  Social History   Substance and Sexual Activity  Alcohol Use No  . Alcohol/week: 0.0 oz     Social History   Substance and Sexual Activity  Drug Use No    Social History   Socioeconomic History  . Marital status: Married    Spouse name: None  . Number of children: None  . Years of education: None  . Highest education level: None  Social Needs  . Financial resource strain: None  . Food insecurity - worry: None  . Food insecurity - inability: None  . Transportation needs - medical: None  . Transportation needs - non-medical: None  Occupational History  . None  Tobacco Use  . Smoking status: Current Every Day Smoker    Packs/day: 0.50    Years: 45.00    Pack years: 22.50    Types: Cigarettes  . Smokeless tobacco: Never Used  . Tobacco comment: started at age 30  Substance and Sexual Activity  . Alcohol use: No    Alcohol/week: 0.0 oz  . Drug use: No  . Sexual activity: Not Currently  Other Topics Concern  . None  Social History Narrative  . None   Additional Social History:    Allergies:  No Known Allergies  Labs:  Results for orders placed or performed during the hospital encounter of 11/13/17 (from the past 48 hour(s))  Basic metabolic panel     Status: Abnormal   Collection Time: 11/20/17  4:53 AM  Result Value Ref Range   Sodium 132 (L) 135 - 145 mmol/L   Potassium 3.3 (L) 3.5 - 5.1 mmol/L   Chloride 99 (L) 101 - 111 mmol/L   CO2 24 22 - 32 mmol/L   Glucose, Bld 88 65 - 99 mg/dL   BUN 12 6 - 20 mg/dL   Creatinine, Ser 0.58 0.44 - 1.00 mg/dL   Calcium 7.7 (L) 8.9 - 10.3 mg/dL   GFR calc non Af Amer >60 >60 mL/min   GFR calc Af Amer >60 >60 mL/min    Comment: (NOTE) The eGFR has been calculated using the CKD EPI equation. This calculation has not been validated in all clinical situations. eGFR's persistently <60 mL/min signify possible Chronic Kidney Disease.    Anion gap 9 5 - 15    Comment: Performed at [facility], [address] [place], [place] [address]  Basic metabolic panel     Status: Abnormal   Collection Time: 11/21/17  5:39 AM  Result Value Ref Range   Sodium 137 135 - 145 mmol/L   Potassium 4.8 3.5 - 5.1 mmol/L   Chloride 104 101 - 111 mmol/L   CO2 23 22 - 32 mmol/L   Glucose, Bld 116 (H) 65 - 99 mg/dL   BUN 17 6 - 20 mg/dL   Creatinine, Ser 0.77 0.44 - 1.00 mg/dL   Calcium 8.6 (L) 8.9 - 10.3 mg/dL   GFR calc non Af Amer >60 >60 mL/min   GFR calc Af Amer >60 >60 mL/min    Comment: (NOTE) The eGFR  has been calculated using the CKD EPI equation. This calculation has not been validated in all clinical situations. eGFR's persistently <60 mL/min signify possible Chronic Kidney Disease.    Anion gap 10 5 - 15    Comment: Performed at [facility], [address] [place], [place] [address]    Current Facility-Administered Medications  Medication Dose Route Frequency Provider Last Rate Last Dose  . 0.9 %  sodium chloride infusion  250 mL Intravenous PRN Lance Coon, MD      . acetaminophen (TYLENOL) tablet 650 mg  650 mg Oral Q4H PRN Lance Coon, MD      . acetylcysteine (MUCOMYST) 20 % nebulizer / oral solution 3 mL  3 mL Nebulization BID Dustin Flock, MD      . amitriptyline (ELAVIL) tablet 10 mg  10 mg Oral QHS Dustin Flock, MD      . aspirin EC tablet 81 mg  81 mg Oral Daily Dustin Flock, MD   81 mg at 11/21/17 1715  . atorvastatin (LIPITOR) tablet 20 mg  20 mg Oral q1800 Dustin Flock, MD   20 mg at 11/21/17 1715  . budesonide (PULMICORT) nebulizer solution 0.25 mg  0.25 mg Nebulization Q6H Wilhelmina Mcardle, MD   0.25 mg at 11/21/17 1333  . clonazePAM (KLONOPIN) tablet 0.5 mg  0.5 mg Oral QHS PRN Dorene Sorrow S, NP   0.5 mg at 11/18/17 2234  . enoxaparin (LOVENOX) injection 40 mg  40 mg Subcutaneous Q24H Lance Coon, MD   40 mg at 11/20/17 2244  . feeding supplement (ENSURE ENLIVE) (ENSURE ENLIVE) liquid 237 mL  237 mL Oral BID BM  Wilhelmina Mcardle, MD   237 mL at 11/21/17 1504  . guaiFENesin (MUCINEX) 12 hr tablet 600 mg  600 mg Oral BID Dustin Flock, MD   600 mg at 11/21/17 1715  . ipratropium-albuterol (DUONEB) 0.5-2.5 (3) MG/3ML nebulizer solution 3 mL  3 mL Nebulization Q6H Wilhelmina Mcardle, MD   3 mL at 11/21/17 1333  . isosorbide dinitrate (ISORDIL) tablet 30 mg  30 mg Oral Daily Dustin Flock, MD   30 mg at 11/21/17 1504  . levofloxacin (LEVAQUIN) IVPB 500 mg  500 mg Intravenous Q24H Dustin Flock, MD   Stopped at 11/21/17 1631  . MEDLINE mouth rinse  15 mL Mouth Rinse BID Conforti, Lynnea Vandervoort, DO   15 mL at 11/21/17 0733  . methylPREDNISolone sodium succinate (SOLU-MEDROL) 125 mg/2 mL injection 60 mg  60 mg Intravenous Q6H Dustin Flock, MD   60 mg at 11/21/17 1504  . ondansetron (ZOFRAN) injection 4 mg  4 mg Intravenous Q6H PRN Lance Coon, MD      . phenol (CHLORASEPTIC) mouth spray 1 spray  1 spray Mouth/Throat PRN Awilda Bill, NP      . polyethylene glycol (MIRALAX / GLYCOLAX) packet 17 g  17 g Per Tube Daily Conforti, Jahaan Vanwagner, DO   17 g at 11/21/17 0733  . venlafaxine XR (EFFEXOR-XR) 24 hr capsule 225 mg  225 mg Oral QHS Lenis Noon, [facility]        Musculoskeletal: Strength & Muscle Tone: within normal limits Gait & Station: normal Patient leans: N/A  Psychiatric Specialty Exam: Physical Exam  Nursing note and vitals reviewed. Constitutional: She appears well-developed and well-nourished.  HENT:  Head: Normocephalic and atraumatic.  Eyes: Conjunctivae are normal. Pupils are equal, round, and reactive to light.  Neck: Normal range of motion.  Cardiovascular: Normal heart sounds.  Respiratory: She is in respiratory  distress.  GI: Soft.  Musculoskeletal: Normal range of motion.  Neurological: She is alert.  Skin: Skin is warm and dry.  Psychiatric: She has a normal mood and affect. Her speech is normal and behavior is normal. Judgment and thought content normal. Cognition and memory are normal.     Review of Systems  Constitutional: Negative.   HENT: Negative.   Eyes: Negative.   Respiratory: Negative.   Cardiovascular: Negative.   Gastrointestinal: Negative.   Musculoskeletal: Negative.   Skin: Negative.   Neurological: Negative.   Psychiatric/Behavioral: Negative.     Blood pressure 90/66, pulse (!) 104, temperature 98.2 F (36.8 C), temperature source Oral, resp. rate 18, height '5\' 2"'  (1.575 m), weight 73.1 kg (161 lb 2.5 oz), SpO2 91 %.Body mass index is 29.48 kg/m.  General Appearance: Casual  Eye Contact:  Good  Speech:  Clear and Coherent  Volume:  Normal  Mood:  Euthymic  Affect:  Congruent  Thought Process:  Coherent  Orientation:  Full (Time, Place, and Person)  Thought Content:  Logical  Suicidal Thoughts:  No  Homicidal Thoughts:  No  Memory:  Immediate;   Fair Recent;   Fair Remote;   Fair  Judgement:  Fair  Insight:  Fair  Psychomotor Activity:  Normal  Concentration:  Concentration: Fair  Recall:  [employer] of Knowledge:  Fair  Language:  Fair  Akathisia:  No  Handed:  Right  AIMS (if indicated):     Assets:  Desire for Improvement Housing Physical Health  ADL's:  Intact  Cognition:  WNL  Sleep:        Treatment Plan Summary: Plan Patient continues to be stable.  No indication to make any changes to psychiatric treatment.  Supportive counseling and once again reminded her of the importance of not misusing any prescription medicines or taking anything she is not prescribed.  Signing off for now.  Patient is anticipating likely discharge soon.  Disposition: No evidence of imminent risk to self or others at present.   Patient does not meet criteria for psychiatric inpatient admission.  Alethia Berthold, MD 11/21/2017 5:53 PM

## 2017-11-21 NOTE — Progress Notes (Signed)
Physical Therapy Treatment Patient Details Name: Kim Buchanan MRN: [id number] DOB: [birth year] Today's Date: 11/21/2017    History of Present Illness Kim Buchanan  is a 67 y.o. female who presents obtunded.  Patient was responsive only to painful stimuli on presentation in the ED.  Patient's husband reports that he feels she may have taken more medications than she was prescribed.  Patient is on baclofen, Klonopin, Elavil, venlafaxine. Her husband states that she typically manages the medication, but that he feels like he has noted her taking extra baclofen tablets in the past.  At some point in the past she had a presentation similar to this, though not as extreme in the ED when she had taken some extra medication.  He denies that she has any intention of self-harm.  In the ED she had to be intubated due to a significantly low respiratory rate and some hypercarbia.  Other workup in the ED is largely benign.  Hospitalist were called for admission and pt is now admitted to the ICU for acute on chronic respiratory failure s/p extubation, laryngeal edema, and acute encephalopathy secondary to possible overdose.     PT Comments    Pt found in bathroom with spouse; pt notes she has just gotten physically ill. Pt is not wearing O2. Pt assisted back to the bed demonstrating ambulation without AD, but using 1 upper extremity on IV pole. Poor posture ans short choppy steps, but no overt loss of balance. Pt demonstrates stand to sit transfer without problem and able to mobilize into bed and reposition into bed with Mod I; spouse present and somewhat protective not wanting her to perform bed mobility by herself. O2 saturation on room air 88%; pt declines placing O2 back on. No further PT attempted, as pt feeling sick. IV alarming and spouse is Insurance risk surveyor. Continue PT tomorrow with possible change in discharge recommendation if pt able to demonstrate further, safe walking. Continue to progress functional  mobility.    Follow Up Recommendations  SNF;Other (comment)(progressing; home if able to ambulate further  tomorrow)     Equipment Recommendations       Recommendations for Other Services       Precautions / Restrictions Precautions Precautions: None Restrictions Weight Bearing Restrictions: No    Mobility  Bed Mobility Overal bed mobility: Modified Independent             General bed mobility comments: Able to reposition self in bed although spouse protective and does not wish pt to do so herself.   Transfers Overall transfer level: Needs assistance Equipment used: None Transfers: Sit to/from Stand Sit to Stand: Supervision         General transfer comment: Uses hand appropriately  Ambulation/Gait Ambulation/Gait assistance: Supervision(assist only for IV pole) Ambulation Distance (Feet): 25 Feet Assistive device: None Gait Pattern/deviations: Trunk flexed;Shuffle(increased R/L sway)         Stairs            Wheelchair Mobility    Modified Rankin (Stroke Patients Only)       Balance Overall balance assessment: Needs assistance         Standing balance support: Single extremity supported(using IV pole) Standing balance-Leahy Scale: Fair                              Cognition Arousal/Alertness: Awake/alert Behavior During Therapy: WFL for tasks assessed/performed  General Comments: Pt mildly distraught; pt in bathroom upon arrival. States she just became ill. Pt also without O2 donned. Answers questions relevantly. Does not wish O2 on despite low O2 saturation      Exercises      General Comments        Pertinent Vitals/Pain Pain Assessment: No/denies pain    Home Living                      Prior Function            PT Goals (current goals can now be found in the care plan section) Progress towards PT goals: Progressing toward goals     Frequency    Min 2X/week      PT Plan Current plan remains appropriate    Co-evaluation              AM-PAC PT "6 Clicks" Daily Activity  Outcome Measure  Difficulty turning over in bed (including adjusting bedclothes, sheets and blankets)?: None Difficulty moving from lying on back to sitting on the side of the bed? : A Little Difficulty sitting down on and standing up from a chair with arms (e.g., wheelchair, bedside commode, etc,.)?: None Help needed moving to and from a bed to chair (including a wheelchair)?: A Little Help needed walking in hospital room?: A Little Help needed climbing 3-5 steps with a railing? : A Lot 6 Click Score: 19    End of Session   Activity Tolerance: Patient limited by fatigue;Other (comment)(illness) Patient left: in bed;with call bell/phone within reach;with family/visitor present;with bed alarm set Nurse Communication: (spouse calling nurse due to IV alarming) PT Visit Diagnosis: Unsteadiness on feet (R26.81);Muscle weakness (generalized) (M62.81);Difficulty in walking, not elsewhere classified (R26.2)     Time: 2876-8115 PT Time Calculation (min) (ACUTE ONLY): 11 min  Charges:  $Gait Training: 8-22 mins                    G CodesLarae Grooms, PTA 11/21/2017, 4:23 PM

## 2017-11-21 NOTE — Progress Notes (Signed)
Pt is refusing to use BIPAP for sleep tonight. Pt encouraged to wear BIPAP and admits to needing the BIPAP for sleep but continues to refuse the BIPAP use while sleeping. Pt encouraged to call should she change her mind. BIPAP at bedside. Family member at bedside

## 2017-11-21 NOTE — Progress Notes (Signed)
[facility] [place] NAME: Kim Buchanan    MR#:  [id number]  DATE OF BIRTH:  [birth year]  SUBJECTIVE:  CHIEF COMPLAINT:   Chief Complaint  Patient presents with  . Drug Overdose  . Altered Mental Status   She states that she feels fine, however she is requiring 4-6 L of oxygen continues to complain of cough    REVIEW OF SYSTEMS:   CONSTITUTIONAL: No documented fever. No fatigue, weakness. No weight gain, no weight loss.  EYES: No blurry or double vision.  ENT: No tinnitus. No postnasal drip. No redness of the oropharynx.  RESPIRATORY: + cough, no wheeze, no hemoptysis.  +dyspnea.  CARDIOVASCULAR: No chest pain. No orthopnea. No palpitations. No syncope.  GASTROINTESTINAL: No nausea, no vomiting or diarrhea. No abdominal pain. No melena or hematochezia.  GENITOURINARY:  No urgency. No frequency. No dysuria. No hematuria. No obstructive symptoms. No discharge. No pain. No significant abnormal bleeding ENDOCRINE: No polyuria or nocturia. No heat or cold intolerance.  HEMATOLOGY: No anemia. No bruising. No bleeding. No purpura. No petechiae INTEGUMENTARY: No rashes. No lesions.  MUSCULOSKELETAL: No arthritis. No swelling. No gout.  NEUROLOGIC: No numbness, tingling, or ataxia. No seizure-type activity.  PSYCHIATRIC: No anxiety. No insomnia. No ADD.        DRUG ALLERGIES:  No Known Allergies VITALS:  Blood pressure 90/66, pulse 94, temperature 98.2 F (36.8 C), temperature source Oral, resp. rate 18, height 5\' 2"  (1.575 m), weight 161 lb 2.5 oz (73.1 kg), SpO2 96 %. PHYSICAL EXAMINATION:   GENERAL: No distress HEAD, EYES, EARS, NOSE AND THROAT: Atraumatic, normocephalic. Extraocular muscles are intact. Pupils equal and reactive to light. Sclerae anicteric. No conjunctival injection. No oro-pharyngeal erythema.  NECK: Supple. There is no jugular venous distention. No bruits, no lymphadenopathy, no thyromegaly.  HEART: Regular  rate and rhythm, tachycardic. No murmurs, no rubs, no clicks.  LUNGS: Rhonchus breath sounds bilaterally.  ABDOMEN: Soft, flat, nontender, nondistended. Has good bowel sounds. No hepatosplenomegaly appreciated.  EXTREMITIES: No evidence of any cyanosis, clubbing, or peripheral edema.  +2 pedal and radial pulses bilaterally.  SKIN: Moist and warm with no rashes appreciated.  MUSCULOSKELETAL: denies any joint swelling, gout LYMPHATIC: No cervical or axillary lymphadenopathy.  NEUROLOGIC: confused PYCH: Not anxious or depressed  LABORATORY PANEL:  Female CBC Recent Labs  Lab 11/18/17 0457  WBC 7.4  HGB 13.2  HCT 39.0  PLT 194   ------------------------------------------------------------------------------------------------------------------ Chemistries  Recent Labs  Lab 11/17/17 0504  11/21/17 0539  NA 133*   < > 137  K 4.0   < > 4.8  CL 104   < > 104  CO2 18*   < > 23  GLUCOSE 161*   < > 116*  BUN 22*   < > 17  CREATININE 0.62   < > 0.77  CALCIUM 8.0*   < > 8.6*  MG 2.4  --   --    < > = values in this interval not displayed.   RADIOLOGY:  No results found. ASSESSMENT AND PLAN:  67 yo female admitted with acute encephalopathy likely secondary to accidental vs. unintentional drug overdose requiring mechanical intubation for airway protection  * Acute respiratory failure with hypercapnia (HCC) due to possible overdose S/p extubation She continued to require high oxygen Continue nebulizer therapy Continue Pulmicort Continue incentive spirometer Mucinex Continue Mucomyst  I will add IV Solu-Medrol to current therapy Change oral doxycycline to IV Levaquin   * Depression,  major, recurrent ([place]) -psych eval shaded  * Generalized anxiety disorder -can restart some home meds  * GERD (gastroesophageal reflux disease) -Pepcid orally     All the records are reviewed and case discussed with Care Management/Social Worker. Management plans discussed with the patient, Dr  Wylie Hail and they are in agreement.  CODE STATUS: Full Code  TOTAL TIME TAKING CARE OF THIS PATIENT: 15 minutes.   More than 50% of the time was spent in counseling/coordination of care: YES     Dustin Flock M.D on 11/21/2017 at 2:11 PM  Between 7am to 6pm - Pager - [phone]  After 6pm go to [email or web address] - password [email or web address] [facility]  [facility]  Office  [phone]  CC: Primary care physician; Mikey College, NP  Note: This dictation was prepared with Dragon dictation along with smaller phrase technology. Any transcriptional errors that result from this process are unintentional.

## 2017-11-22 ENCOUNTER — Inpatient Hospital Stay: Payer: Medicare Other

## 2017-11-22 LAB — CBC
HCT: 36.3 % (ref 35.0–47.0)
Hemoglobin: 12.2 g/dL (ref 12.0–16.0)
MCH: 30.6 pg (ref 26.0–34.0)
MCHC: 33.6 g/dL (ref 32.0–36.0)
MCV: 90.9 fL (ref 80.0–100.0)
Platelets: 217 10*3/uL (ref 150–440)
RBC: 4 MIL/uL (ref 3.80–5.20)
RDW: 12.9 % (ref 11.5–14.5)
WBC: 7.9 10*3/uL (ref 3.6–11.0)

## 2017-11-22 MED ORDER — LEVOFLOXACIN 500 MG PO TABS
500.0000 mg | ORAL_TABLET | Freq: Every day | ORAL | Status: DC
  Administered 2017-11-22: 10:00:00 500 mg via ORAL
  Filled 2017-11-22: qty 1

## 2017-11-22 MED ORDER — PREDNISONE 10 MG (21) PO TBPK: ORAL_TABLET | ORAL | 0 refills | Status: DC

## 2017-11-22 MED ORDER — TIOTROPIUM BROMIDE MONOHYDRATE 18 MCG IN CAPS: 18.0000 ug | ORAL_CAPSULE | Freq: Every day | RESPIRATORY_TRACT | 2 refills | Status: DC

## 2017-11-22 MED ORDER — LEVOFLOXACIN 500 MG PO TABS: 500.0000 mg | ORAL_TABLET | Freq: Every day | ORAL | 0 refills | Status: AC

## 2017-11-22 MED ORDER — BUDESONIDE 0.25 MG/2ML IN SUSP: 0.2500 mg | Freq: Two times a day (BID) | RESPIRATORY_TRACT | 12 refills | Status: DC

## 2017-11-22 MED ORDER — GUAIFENESIN ER 600 MG PO TB12: 600.0000 mg | ORAL_TABLET | Freq: Two times a day (BID) | ORAL | 0 refills | Status: AC

## 2017-11-22 MED ORDER — IPRATROPIUM-ALBUTEROL 0.5-2.5 (3) MG/3ML IN SOLN: 3.0000 mL | Freq: Four times a day (QID) | RESPIRATORY_TRACT | 2 refills | Status: DC

## 2017-11-22 NOTE — Care Management (Signed)
patient and her husband are declining skilled nursing home placement. Agreeable to home health.  No agency preference.  Confirmed all contact information .  Current with her pcp at [facility].  Has access to walkers at home.  Referral for home health SN and PT called to and accepted by [facility].  she currently is on room air.  Requested exertional room air sat prior to discharge.  Husband to transport home

## 2017-11-22 NOTE — Clinical Social Work Note (Signed)
CSW received referral for SNF.  Case discussed with case manager and plan is to discharge home with home health.  CSW to sign off please re-consult if social work needs arise.  Imanuel Pruiett R. Alayza Pieper, MSW, LCSWA [phone]  

## 2017-11-22 NOTE — Progress Notes (Signed)
PHARMACIST - PHYSICIAN COMMUNICATION DR:   Posey Pronto  CONCERNING: Antibiotic IV to Oral Route Change Policy  RECOMMENDATION: This patient is receiving levofloxacin by the intravenous route.  Based on criteria approved by the Pharmacy and Therapeutics Committee, the antibiotic(s) is/are being converted to the equivalent oral dose form(s).   DESCRIPTION: These criteria include:  Patient being treated for a respiratory tract infection, urinary tract infection, cellulitis or clostridium difficile associated diarrhea if on metronidazole  The patient is not neutropenic and does not exhibit a GI malabsorption state  The patient is eating (either orally or via tube) and/or has been taking other orally administered medications for a least 24 hours  The patient is improving clinically and has a Tmax < 100.5  If you have questions about this conversion, please contact the [place], PharmD, BCPS Clinical Pharmacist 11/28/2017 7:42 AM

## 2017-11-22 NOTE — Progress Notes (Signed)
Resumed 2l [place] after treatment due to 89% sat on ra

## 2017-11-22 NOTE — Discharge Instructions (Signed)
[place] [facility]:  Cardiac diet  DISCHARGE CONDITION:  Stable  ACTIVITY:  Activity as tolerated with walker  OXYGEN:  Home Oxygen: Yes.     Oxygen Delivery: 2 liters/min via Patient connected to nasal cannula oxygen  DISCHARGE LOCATION:  home    ADDITIONAL DISCHARGE INSTRUCTION: stop smoking need to take all meds   If you experience worsening of your admission symptoms, develop shortness of breath, life threatening emergency, suicidal or homicidal thoughts you must seek medical attention immediately by calling 911 or calling your MD immediately  if symptoms less severe.  You Must read complete instructions/literature along with all the possible adverse reactions/side effects for all the Medicines you take and that have been prescribed to you. Take any new Medicines after you have completely understood and accpet all the possible adverse reactions/side effects.   Please note  You were cared for by a hospitalist during your hospital stay. If you have any questions about your discharge medications or the care you received while you were in the hospital after you are discharged, you can call the unit and asked to speak with the hospitalist on call if the hospitalist that took care of you is not available. Once you are discharged, your primary care physician will handle any further medical issues. Please note that NO REFILLS for any discharge medications will be authorized once you are discharged, as it is imperative that you return to your primary care physician (or establish a relationship with a primary care physician if you do not have one) for your aftercare needs so that they can reassess your need for medications and monitor your lab values.

## 2017-11-22 NOTE — Progress Notes (Signed)
02 sat on room air at rest: 93 02 sat with exertion on room air: 86 02 sat with exertion on 2L 02: 92

## 2017-11-22 NOTE — Care Management Important Message (Signed)
Important Message  Patient Details  Name: Kim Buchanan MRN: [id number] Date of Birth: [birth year]   Medicare Important Message Given:  Yes  Signed IM notice given   Katrina Stack, RN 12/13/2017, 9:08 AM

## 2017-11-22 NOTE — Discharge Summary (Addendum)
[place] at [facility], 67 y.o., DOB [birth year], MRN [id number]. Admission date: 11/13/2017 Discharge Date 11/25/2017 Primary MD Mikey College, NP Admitting Physician Lance Coon, MD  Admission Diagnosis  Acute respiratory failure, unspecified whether with hypoxia or hypercapnia [facility]) [J96.00]     Hospital Course Principal Problem: Acute respiratory failure with hypercapnia  Acute on chronic COPD exasperation Community-acquired pneumonia Depression major recurrent Generalized anxiety disorder GERD  Hospital Course Kim Buchanan a66 y.o.femalewho presents obtunded. Patient was responsive only to painful stimuli on presentation in the ED. Patient's husband reports that he feels she may have taken more medications than she was prescribed.   Patient was intubated and then subsequently extubated.  It is unclear whether her initial presentation was due to accidental ingestion of her medication or hypercapnia due to COPD exasperation.  Patient to post extubation continue to have hypoxia and difficulty with breathing.  Chest x-ray showed bilateral infiltrates.  She was treated for pneumonia.  She is doing better.  Still requiring oxygen.  She will need to follow-up with her primary care provider to see if she continues to need oxygen as outpatient.  Her mental status is much improved at.  She was also seen by psychiatry as well.    Pt had copd exasperation treated with oxygen, still requiring oxygen She received IV Solu-Medrol she received neublizer treatments, and only with these treatments her oxygenation has improved.  She needs to be treated for COPD as outpatient.         Consults pulm Significant Tests:  See full reports for all details    Dg Abd 1 View  Result Date: 11/15/2017 CLINICAL DATA:  OG tube placement. EXAM: ABDOMEN - 1 VIEW COMPARISON:  Single-view of the abdomen 11/14/2017. FINDINGS: OG tube is in place  with the tip in the midbody of the stomach. Large colonic stool burden noted. IMPRESSION: OG tube tip is in the stomach. Large colonic stool burden. Electronically Signed   By: Inge Rise M.D.   On: 11/15/2017 12:44   Ct Head Wo Contrast  Result Date: 11/13/2017 CLINICAL DATA:  Altered mental status EXAM: CT HEAD WITHOUT CONTRAST TECHNIQUE: Contiguous axial images were obtained from the base of the skull through the vertex without intravenous contrast. COMPARISON:  His head CT 12/19/2011 FINDINGS: Brain: No mass lesion, intraparenchymal hemorrhage or extra-axial collection. No evidence of acute cortical infarct. Brain parenchyma and CSF-containing spaces are normal for age. Vascular: No hyperdense vessel or unexpected calcification. Skull: Normal visualized skull base, calvarium and extracranial soft tissues. Sinuses/Orbits: No sinus fluid levels or advanced mucosal thickening. No mastoid effusion. Normal orbits. IMPRESSION: Normal head CT. Electronically Signed   By: Ulyses Jarred M.D.   On: 11/13/2017 20:01   Ct Soft Tissue Neck Wo Contrast  Result Date: 11/15/2017 CLINICAL DATA:  Chronic dyspnea. EXAM: CT NECK WITHOUT CONTRAST TECHNIQUE: Multidetector CT imaging of the neck was performed following the standard protocol without intravenous contrast. COMPARISON:  None. FINDINGS: Pharynx and larynx: The patient is intubated. An NG tube is in place. The cough is below the vocal cords and partially deflated. No definite mucosal or submucosal lesions are present. Salivary glands: Within normal limits. Thyroid: The thyroid is normal. There is edema surrounding the trachea below the vocal cords. Lymph nodes: No significant adenopathy is present. Vascular: No significant vascular lesions are present. Limited intracranial: Within normal limits. Visualized orbits: Globes and orbits are within normal limits as well. Mastoids and visualized paranasal sinuses: The  paranasal sinuses and mastoid air cells are  clear. Skeleton: Mild endplate changes and uncovertebral spurring is present at C4-5, C5-6, and C6-7. Vertebral body heights are maintained. Alignment is anatomic. No focal lytic or blastic lesions are present. Upper chest: Lung apices are clear. IMPRESSION: 1. The patient is intubated. Cuff is partially deflated. Mild edema surrounds the upper trachea without a focal mucosal lesion. No mass lesion is present. There is no defect. 2. Mild degenerative changes in the cervical spine. Electronically Signed   By: San Morelle M.D.   On: 11/15/2017 12:24   Dg Chest Port 1 View  Result Date: 11/30/2017 CLINICAL DATA:  Shortness of breath. Gastroesophageal reflux disease. Smoker. EXAM: PORTABLE CHEST 1 VIEW COMPARISON:  11/20/2017 FINDINGS: Midline trachea. Normal heart size and mediastinal contours. No pleural effusion or pneumothorax. Mild right hemidiaphragm elevation with right pleural thickening. Further improvement in right-sided aeration. Subtle right infrahilar and inferior right upper lobe patchy opacities remain. Mild diffuse interstitial thickening. IMPRESSION: Improvement in patchy right-sided opacities, favoring improving infection. Electronically Signed   By: Abigail Miyamoto M.D.   On: 12/05/2017 08:17   Dg Chest Port 1 View  Result Date: 11/20/2017 CLINICAL DATA:  67 year old female with respiratory failure. EXAM: PORTABLE CHEST 1 VIEW COMPARISON:  11/17/2017 and earlier. FINDINGS: Portable AP upright view at 0439 hrs. Extubated and enteric tube removed. Stable to mildly improved lung volumes. Improved bibasilar ventilation. Normal cardiac size and mediastinal contours. Small volume of pleural fluid along the right minor fissure. Patchy residual right hilar opacity. No pneumothorax or pulmonary edema. IMPRESSION: 1. Extubated and enteric tube removed. 2. Improved ventilation in both lungs and decreased right pleural effusion. Electronically Signed   By: Genevie Ann M.D.   On: 11/20/2017 06:45   Dg  Chest Port 1 View  Result Date: 11/17/2017 CLINICAL DATA:  Acute respiratory failure EXAM: PORTABLE CHEST 1 VIEW COMPARISON:  11/13/2017 FINDINGS: Cardiac shadow is stable. Endotracheal tube and nasogastric catheter are again seen and stable. Elevation the right hemidiaphragm is again noted with right basilar infiltrate and likely small effusion. No bony abnormality is noted. IMPRESSION: New right basilar infiltrate and effusion. Electronically Signed   By: Inez Catalina M.D.   On: 11/17/2017 07:39   Dg Chest Portable 1 View  Result Date: 11/13/2017 CLINICAL DATA:  Endotracheal tube placement EXAM: PORTABLE CHEST 1 VIEW COMPARISON:  11/13/2017 FINDINGS: Endotracheal tube was placed with tip measuring 2.9 cm above the carina. Shallow inspiration with linear atelectasis in the lung bases. Heart size and pulmonary vascularity are normal. No consolidation or airspace disease in the lungs. No blunting of costophrenic angles. No pneumothorax. IMPRESSION: Endotracheal tube appears in satisfactory position. Shallow inspiration with atelectasis in the lung bases. Electronically Signed   By: Lucienne Capers M.D.   On: 11/13/2017 20:58   Dg Chest Portable 1 View  Result Date: 11/13/2017 CLINICAL DATA:  67 year old female with a history altered mental status EXAM: PORTABLE CHEST 1 VIEW COMPARISON:  07/19/2017 FINDINGS: Low lung volumes accentuating the interstitium. Cardiomediastinal silhouette likely unchanged in size and contour. No pneumothorax or pleural effusion. No new confluent airspace disease. Linear/reticular opacity at the left lung base, likely atelectasis. No displaced fracture. IMPRESSION: Low lung volumes without evidence of superimposed acute cardiopulmonary disease. Electronically Signed   By: Corrie Mckusick D.O.   On: 11/13/2017 19:52   Dg Abd Portable 1 View  Result Date: 11/14/2017 CLINICAL DATA:  OG tube placement EXAM: PORTABLE ABDOMEN - 1 VIEW COMPARISON:  CT abdomen and  pelvis 05/01/2016  FINDINGS: Enteric tube tip is in the left upper quadrant consistent with location in the body of the stomach. Gas and ingested material is demonstrated in the stomach. Changes could reflect gastroparesis or decreased motility. Stool-filled colon. No small or large bowel distention. No radiopaque stones. Postoperative changes in the pelvis. Degenerative changes in the spine. IMPRESSION: Enteric tube tip is in the left upper quadrant consistent with location in the body of the stomach. Gas and ingested material filled the stomach possibly indicating decreased motility. Electronically Signed   By: Lucienne Capers M.D.   On: 11/14/2017 00:51       Today   Subjective:   Kristianne Albin  Feeling better wants to go home  Objective:   Blood pressure (!) 110/99, pulse 90, temperature 97.7 F (36.5 C), temperature source Oral, resp. rate 20, height 5\' 2"  (1.575 m), weight 161 lb 2.5 oz (73.1 kg), SpO2 (!) 89 %.  .  Intake/Output Summary (Last 24 hours) at 11/23/2017 1537 Last data filed at 12/07/2017 1004 Gross per 24 hour  Intake 577.3 ml  Output -  Net 577.3 ml    Exam VITAL SIGNS: Blood pressure (!) 110/99, pulse 90, temperature 97.7 F (36.5 C), temperature source Oral, resp. rate 20, height 5\' 2"  (1.575 m), weight 161 lb 2.5 oz (73.1 kg), SpO2 (!) 89 %.  GENERAL:  67 y.o.-year-old patient lying in the bed with no acute distress.  EYES: Pupils equal, round, reactive to light and accommodation. No scleral icterus. Extraocular muscles intact.  HEENT: Head atraumatic, normocephalic. Oropharynx and nasopharynx clear.  NECK:  Supple, no jugular venous distention. No thyroid enlargement, no tenderness.  LUNGS: Normal breath sounds bilaterally, no wheezing, rales,rhonchi or crepitation. No use of accessory muscles of respiration.  CARDIOVASCULAR: S1, S2 normal. No murmurs, rubs, or gallops.  ABDOMEN: Soft, nontender, nondistended. Bowel sounds present. No organomegaly or mass.  EXTREMITIES: No  pedal edema, cyanosis, or clubbing.  NEUROLOGIC: Cranial nerves II through XII are intact. Muscle strength 5/5 in all extremities. Sensation intact. Gait not checked.  PSYCHIATRIC: The patient is alert and oriented x 3.  SKIN: No obvious rash, lesion, or ulcer.   Data Review     CBC w Diff:  Lab Results  Component Value Date   WBC 7.9 12/06/2017   HGB 12.2 12/08/2017   HGB 13.1 03/22/2016   HCT 36.3 12/16/2017   HCT 38.8 03/22/2016   PLT 217 12/11/2017   PLT 233 03/22/2016   LYMPHOPCT 37 12/12/2016   LYMPHOPCT 38.2 02/24/2013   MONOPCT 7 12/12/2016   MONOPCT 7.7 02/24/2013   EOSPCT 3 12/12/2016   EOSPCT 3.5 02/24/2013   BASOPCT 1 12/12/2016   BASOPCT 0.6 02/24/2013   CMP:  Lab Results  Component Value Date   NA 137 11/21/2017   NA 141 03/22/2016   NA 143 02/24/2013   K 4.8 11/21/2017   K 4.1 02/24/2013   CL 104 11/21/2017   CL 111 (H) 02/24/2013   CO2 23 11/21/2017   CO2 26 02/24/2013   BUN 17 11/21/2017   BUN 16 03/22/2016   BUN 11 02/24/2013   CREATININE 0.77 11/21/2017   CREATININE 0.75 08/06/2016   PROT 7.1 11/13/2017   PROT 6.3 03/22/2016   PROT 7.2 02/24/2013   ALBUMIN 3.9 11/13/2017   ALBUMIN 3.8 03/22/2016   ALBUMIN 3.6 02/24/2013   BILITOT 0.5 11/13/2017   BILITOT <0.2 03/22/2016   BILITOT 0.4 02/24/2013   ALKPHOS 94 11/13/2017   ALKPHOS 113 02/24/2013  AST 19 11/13/2017   AST 58 (H) 02/24/2013   ALT 18 11/13/2017   ALT 94 (H) 02/24/2013  .  Micro Results Recent Results (from the past 240 hour(s))  MRSA PCR Screening     Status: None   Collection Time: 11/14/17  1:32 AM  Result Value Ref Range Status   MRSA by PCR NEGATIVE NEGATIVE Final    Comment:        The GeneXpert MRSA Assay (FDA approved for NASAL specimens only), is one component of a comprehensive MRSA colonization surveillance program. It is not intended to diagnose MRSA infection nor to guide or monitor treatment for MRSA infections. Performed at [facility],  [address] [place], [place] [address]         Code Status Orders  (From admission, onward)        Start     Ordered   11/14/17 0137  Full code  Continuous     11/14/17 0137    Code Status History    Date Active Date Inactive Code Status Order ID Comments User Context   12/17/2016 11:46 12/18/2016 18:05 Full Code [id number]  Defrancesco, Alanda Slim, MD Inpatient    Advance Directive Documentation     Most Recent Value  Type of Advance Directive  Living will  Pre-existing out of facility DNR order (yellow form or pink MOST form)  No data  "MOST" Form in Place?  No data            Discharge Medications   Allergies as of 12/14/2017   No Known Allergies     Medication List    TAKE these medications   amitriptyline 10 MG tablet Commonly known as:  ELAVIL Take 1 tablet (10 mg total) by mouth at bedtime.   aspirin 81 MG tablet Take 81 mg by mouth daily.   atorvastatin 20 MG tablet Commonly known as:  LIPITOR Take 1 tablet (20 mg total) by mouth at bedtime.   baclofen 10 MG tablet Commonly known as:  LIORESAL Take 1 tablet (10 mg total) by mouth 4 (four) times daily.   budesonide 0.25 MG/2ML nebulizer solution Commonly known as:  PULMICORT Take 2 mLs (0.25 mg total) by nebulization 2 (two) times daily.   clonazePAM 0.5 MG tablet Commonly known as:  KLONOPIN Take 1 tablet (0.5mg ) during day for panic attack once as needed.  Take 3 tablets (1.5mg ) at night for sleep.   docusate sodium 100 MG capsule Commonly known as:  COLACE Take 1 capsule (100 mg total) by mouth 2 (two) times daily.   guaiFENesin 600 MG 12 hr tablet Commonly known as:  MUCINEX Take 1 tablet (600 mg total) by mouth 2 (two) times daily for 7 days.   ibuprofen 800 MG tablet Commonly known as:  ADVIL,MOTRIN Take 1 tablet (800 mg total) by mouth 3 (three) times daily.   ipratropium-albuterol 0.5-2.5 (3) MG/3ML Soln Commonly known as:  DUONEB Take 3 mLs by nebulization every 6 (six)  hours.   isosorbide dinitrate 30 MG tablet Commonly known as:  ISORDIL Take 1 tablet (30 mg total) by mouth daily.   levofloxacin 500 MG tablet Commonly known as:  LEVAQUIN Take 1 tablet (500 mg total) by mouth daily for 5 days. Start taking on:  11/23/2017   omeprazole 40 MG capsule Commonly known as:  PRILOSEC Take 1 capsule (40 mg total) by mouth daily.   polyethylene glycol packet Commonly known as:  MIRALAX / GLYCOLAX Take 17 g by mouth  daily as needed.   predniSONE 10 MG (21) Tbpk tablet Commonly known as:  STERAPRED UNI-PAK 21 TAB Start at 60mg  taper by 10mg  until complete   senna 8.6 MG Tabs tablet Commonly known as:  SENOKOT Take 2 tablets by mouth daily as needed for mild constipation.   tiotropium 18 MCG inhalation capsule Commonly known as:  SPIRIVA HANDIHALER Place 1 capsule (18 mcg total) into inhaler and inhale daily.   venlafaxine XR 75 MG 24 hr capsule Commonly known as:  EFFEXOR-XR Take 1 capsule (75 mg total) by mouth at bedtime.   venlafaxine XR 150 MG 24 hr capsule Commonly known as:  EFFEXOR-XR Take 1 capsule (150 mg total) by mouth at bedtime.            Durable Medical Equipment  (From admission, onward)        Start     Ordered   12/08/2017 1259  For home use only DME oxygen  Once    Question Answer Comment  Mode or (Route) Nasal cannula   Liters per Minute 2   Frequency Continuous (stationary and portable oxygen unit needed)   Oxygen delivery system Gas      12/03/2017 1258   11/30/2017 1037  For home use only DME Nebulizer machine  Once    Question:  Patient needs a nebulizer to treat with the following condition  Answer:  COPD (chronic obstructive pulmonary disease) ([place])   12/16/2017 1036         Total Time in preparing paper work, data evaluation and todays exam - 33 minutes  Dustin Flock M.D on 12/03/2017 at 3:37 PM  [facility], [facility], [facility]   Office  [phone]

## 2017-11-25 DIAGNOSIS — J441 Chronic obstructive pulmonary disease with (acute) exacerbation: Secondary | ICD-10-CM | POA: Diagnosis not present

## 2017-11-25 DIAGNOSIS — K219 Gastro-esophageal reflux disease without esophagitis: Secondary | ICD-10-CM | POA: Diagnosis not present

## 2017-11-25 DIAGNOSIS — F411 Generalized anxiety disorder: Secondary | ICD-10-CM | POA: Diagnosis not present

## 2017-11-25 DIAGNOSIS — J189 Pneumonia, unspecified organism: Secondary | ICD-10-CM | POA: Diagnosis not present

## 2017-11-25 DIAGNOSIS — F339 Major depressive disorder, recurrent, unspecified: Secondary | ICD-10-CM | POA: Diagnosis not present

## 2017-11-25 DIAGNOSIS — F4310 Post-traumatic stress disorder, unspecified: Secondary | ICD-10-CM | POA: Diagnosis not present

## 2017-11-25 DIAGNOSIS — F17210 Nicotine dependence, cigarettes, uncomplicated: Secondary | ICD-10-CM | POA: Diagnosis not present

## 2017-11-25 DIAGNOSIS — J9602 Acute respiratory failure with hypercapnia: Secondary | ICD-10-CM | POA: Diagnosis not present

## 2017-11-25 DIAGNOSIS — J440 Chronic obstructive pulmonary disease with acute lower respiratory infection: Secondary | ICD-10-CM | POA: Diagnosis not present

## 2017-11-25 DIAGNOSIS — G4730 Sleep apnea, unspecified: Secondary | ICD-10-CM | POA: Diagnosis not present

## 2017-11-25 DIAGNOSIS — J449 Chronic obstructive pulmonary disease, unspecified: Secondary | ICD-10-CM | POA: Diagnosis not present

## 2017-11-26 DIAGNOSIS — F4310 Post-traumatic stress disorder, unspecified: Secondary | ICD-10-CM | POA: Diagnosis not present

## 2017-11-26 DIAGNOSIS — G4730 Sleep apnea, unspecified: Secondary | ICD-10-CM | POA: Diagnosis not present

## 2017-11-26 DIAGNOSIS — F17210 Nicotine dependence, cigarettes, uncomplicated: Secondary | ICD-10-CM | POA: Diagnosis not present

## 2017-11-26 DIAGNOSIS — J440 Chronic obstructive pulmonary disease with acute lower respiratory infection: Secondary | ICD-10-CM | POA: Diagnosis not present

## 2017-11-26 DIAGNOSIS — K219 Gastro-esophageal reflux disease without esophagitis: Secondary | ICD-10-CM | POA: Diagnosis not present

## 2017-11-26 DIAGNOSIS — J189 Pneumonia, unspecified organism: Secondary | ICD-10-CM | POA: Diagnosis not present

## 2017-11-26 DIAGNOSIS — F411 Generalized anxiety disorder: Secondary | ICD-10-CM | POA: Diagnosis not present

## 2017-11-26 DIAGNOSIS — F339 Major depressive disorder, recurrent, unspecified: Secondary | ICD-10-CM | POA: Diagnosis not present

## 2017-11-26 DIAGNOSIS — J441 Chronic obstructive pulmonary disease with (acute) exacerbation: Secondary | ICD-10-CM | POA: Diagnosis not present

## 2017-11-30 DIAGNOSIS — F17210 Nicotine dependence, cigarettes, uncomplicated: Secondary | ICD-10-CM | POA: Diagnosis not present

## 2017-11-30 DIAGNOSIS — G4730 Sleep apnea, unspecified: Secondary | ICD-10-CM | POA: Diagnosis not present

## 2017-11-30 DIAGNOSIS — J441 Chronic obstructive pulmonary disease with (acute) exacerbation: Secondary | ICD-10-CM | POA: Diagnosis not present

## 2017-11-30 DIAGNOSIS — K219 Gastro-esophageal reflux disease without esophagitis: Secondary | ICD-10-CM | POA: Diagnosis not present

## 2017-11-30 DIAGNOSIS — F4310 Post-traumatic stress disorder, unspecified: Secondary | ICD-10-CM | POA: Diagnosis not present

## 2017-11-30 DIAGNOSIS — F339 Major depressive disorder, recurrent, unspecified: Secondary | ICD-10-CM | POA: Diagnosis not present

## 2017-11-30 DIAGNOSIS — J440 Chronic obstructive pulmonary disease with acute lower respiratory infection: Secondary | ICD-10-CM | POA: Diagnosis not present

## 2017-11-30 DIAGNOSIS — J189 Pneumonia, unspecified organism: Secondary | ICD-10-CM | POA: Diagnosis not present

## 2017-11-30 DIAGNOSIS — F411 Generalized anxiety disorder: Secondary | ICD-10-CM | POA: Diagnosis not present

## 2017-12-02 DIAGNOSIS — F339 Major depressive disorder, recurrent, unspecified: Secondary | ICD-10-CM | POA: Diagnosis not present

## 2017-12-02 DIAGNOSIS — F17210 Nicotine dependence, cigarettes, uncomplicated: Secondary | ICD-10-CM | POA: Diagnosis not present

## 2017-12-02 DIAGNOSIS — F4310 Post-traumatic stress disorder, unspecified: Secondary | ICD-10-CM | POA: Diagnosis not present

## 2017-12-02 DIAGNOSIS — F411 Generalized anxiety disorder: Secondary | ICD-10-CM | POA: Diagnosis not present

## 2017-12-02 DIAGNOSIS — J441 Chronic obstructive pulmonary disease with (acute) exacerbation: Secondary | ICD-10-CM | POA: Diagnosis not present

## 2017-12-02 DIAGNOSIS — J440 Chronic obstructive pulmonary disease with acute lower respiratory infection: Secondary | ICD-10-CM | POA: Diagnosis not present

## 2017-12-02 DIAGNOSIS — K219 Gastro-esophageal reflux disease without esophagitis: Secondary | ICD-10-CM | POA: Diagnosis not present

## 2017-12-02 DIAGNOSIS — G4730 Sleep apnea, unspecified: Secondary | ICD-10-CM | POA: Diagnosis not present

## 2017-12-02 DIAGNOSIS — J189 Pneumonia, unspecified organism: Secondary | ICD-10-CM | POA: Diagnosis not present

## 2017-12-03 ENCOUNTER — Ambulatory Visit (INDEPENDENT_AMBULATORY_CARE_PROVIDER_SITE_OTHER): Payer: Medicare Other | Admitting: Nurse Practitioner

## 2017-12-03 ENCOUNTER — Other Ambulatory Visit: Payer: Self-pay

## 2017-12-03 ENCOUNTER — Encounter: Payer: Self-pay | Admitting: Nurse Practitioner

## 2017-12-03 VITALS — BP 113/76 | HR 106 | Temp 98.6°F | Resp 20 | Ht 62.0 in | Wt 161.6 lb

## 2017-12-03 DIAGNOSIS — F32A Depression, unspecified: Secondary | ICD-10-CM

## 2017-12-03 DIAGNOSIS — F329 Major depressive disorder, single episode, unspecified: Secondary | ICD-10-CM

## 2017-12-03 DIAGNOSIS — F5104 Psychophysiologic insomnia: Secondary | ICD-10-CM

## 2017-12-03 DIAGNOSIS — Z09 Encounter for follow-up examination after completed treatment for conditions other than malignant neoplasm: Secondary | ICD-10-CM

## 2017-12-03 DIAGNOSIS — G8929 Other chronic pain: Secondary | ICD-10-CM

## 2017-12-03 DIAGNOSIS — F419 Anxiety disorder, unspecified: Secondary | ICD-10-CM

## 2017-12-03 DIAGNOSIS — M545 Low back pain, unspecified: Secondary | ICD-10-CM

## 2017-12-03 DIAGNOSIS — J9600 Acute respiratory failure, unspecified whether with hypoxia or hypercapnia: Secondary | ICD-10-CM

## 2017-12-03 MED ORDER — AMITRIPTYLINE HCL 10 MG PO TABS
10.0000 mg | ORAL_TABLET | Freq: Every day | ORAL | 5 refills | Status: DC
Start: 2017-12-03 — End: 2017-12-03

## 2017-12-03 MED ORDER — CLONAZEPAM 0.5 MG PO TABS: ORAL_TABLET | ORAL | 0 refills | Status: DC

## 2017-12-03 MED ORDER — BACLOFEN 10 MG PO TABS: 10.0000 mg | ORAL_TABLET | Freq: Two times a day (BID) | ORAL | 0 refills | Status: DC

## 2017-12-03 MED ORDER — AMITRIPTYLINE HCL 25 MG PO TABS: 25.0000 mg | ORAL_TABLET | Freq: Every day | ORAL | 3 refills | Status: DC

## 2017-12-03 NOTE — Progress Notes (Signed)
Subjective:    Patient ID: Kim Buchanan, female    DOB: 07-16-1951, 67 y.o.   MRN: [id number]  JACQUALINE WEICHEL is a 67 y.o. female presenting on 12/03/2017 for Hospitalization Follow-up (acute respiratory distress)   HPI Hospital f/u from [facility] Acute Respiratory Failure Admission Date: 11/13/2017 Discharge Date: 11/29/2017 Diagnosis: Acute respiratory failure: -Accidental or unintentional overdose versus acute on chronic COPD exasperation. Follow up recommendations: Visit with PCP for evaluation of oxygen as outpatient Interval history: Patient is improving slowly since being discharged from hospital.  She assures me today that her respiratory distress was not related to overdose or taking more medications than prescribed, which is consistent with psychiatry notes from inpatient stay. Prior to hospitalization pt does not remember feeling poorly with acute illness.  Since discharge, patient has been receiving home health.  RN is coming for followup from [place] for disease education and oxygen management.  She was also begun on nebulizer treatments, which she is using as prescribed with relief.  Anxiety and Panic attacks/Depression Still feels worry.  Not much panic and increased irritability or hyperactivity from breathing treatments.   Amitriptyline is going well and is significantly improving her sleep quality.  It is also helping relieve pain. - Clonazepam: takes 0.5 mg tablets takes two tablets per day after breathing treatments.  Takes 2 tabs (1 mg total) at afternoon, takes 3 tabs (1.5 mg total) at bedtime.  Chronic Pain Feels pretty good.  Is taking baclofen only intermittently.  Isn't doing as much activity and is not having as much spasm.  Home health physical therapy evaluation and had no additional recommendations for mobility and did not continue.   - Baclofen 1 tablet once daily as needed is all patient is currently requiring.  Social History   Tobacco Use  .  Smoking status: Current Every Day Smoker    Packs/day: 0.25    Years: 45.00    Pack years: 11.25    Types: Cigarettes  . Smokeless tobacco: Never Used  . Tobacco comment: started at age 38  Substance Use Topics  . Alcohol use: No    Alcohol/week: 0.0 oz  . Drug use: No    Review of Systems Per HPI unless specifically indicated above     Objective:    BP 113/76 (BP Location: Right Arm, Patient Position: Sitting, Cuff Size: Normal)   Pulse (!) 106   Temp 98.6 F (37 C) (Oral)   Resp 20   Ht 5\' 2"  (1.575 m)   Wt 161 lb 9.6 oz (73.3 kg)   SpO2 100% Comment: 2L Oxygen  BMI 29.56 kg/m    SpO2 96% after 10 minutes on room air at rest  Wt Readings from Last 3 Encounters:  01/05/18 180 lb 1.9 oz (81.7 kg)  01/01/18 175 lb 9.6 oz (79.7 kg)  12/16/17 163 lb (73.9 kg)    Physical Exam  Constitutional: She is oriented to person, place, and time. She appears well-developed and well-nourished. No distress.  HENT:  Head: Normocephalic and atraumatic.  Eyes: Conjunctivae and EOM are normal. Pupils are equal, round, and reactive to light.  Neck: Normal range of motion. Neck supple. Carotid bruit is not present.  Cardiovascular: Normal rate, regular rhythm, S1 normal, S2 normal, normal heart sounds and intact distal pulses.  Pulmonary/Chest: Effort normal. No respiratory distress. She has decreased breath sounds. She has no wheezes. She has no rhonchi. She has no rales.  Abdominal: Soft. Bowel sounds are normal.  Musculoskeletal:  She exhibits no edema (pedal).  Neurological: She is alert and oriented to person, place, and time.  Skin: Skin is warm and dry.  Psychiatric: She has a normal mood and affect. Her behavior is normal. Judgment and thought content normal.  Vitals reviewed.    Results for orders placed or performed during the hospital encounter of 11/13/17  MRSA PCR Screening  Result Value Ref Range   MRSA by PCR NEGATIVE NEGATIVE  Comprehensive metabolic panel  Result  Value Ref Range   Sodium 142 135 - 145 mmol/L   Potassium 3.9 3.5 - 5.1 mmol/L   Chloride 110 101 - 111 mmol/L   CO2 25 22 - 32 mmol/L   Glucose, Bld 94 65 - 99 mg/dL   BUN 21 (H) 6 - 20 mg/dL   Creatinine, Ser 0.87 0.44 - 1.00 mg/dL   Calcium 8.3 (L) 8.9 - 10.3 mg/dL   Total Protein 7.1 6.5 - 8.1 g/dL   Albumin 3.9 3.5 - 5.0 g/dL   AST 19 15 - 41 U/L   ALT 18 14 - 54 U/L   Alkaline Phosphatase 94 38 - 126 U/L   Total Bilirubin 0.5 0.3 - 1.2 mg/dL   GFR calc non Af Amer >60 >60 mL/min   GFR calc Af Amer >60 >60 mL/min   Anion gap 7 5 - 15  Ethanol  Result Value Ref Range   Alcohol, Ethyl (B) <46 <65 mg/dL  Salicylate level  Result Value Ref Range   Salicylate Lvl <9.9 2.8 - 30.0 mg/dL  Acetaminophen level  Result Value Ref Range   Acetaminophen (Tylenol), Serum <10 (L) 10 - 30 ug/mL  cbc  Result Value Ref Range   WBC 7.3 3.6 - 11.0 K/uL   RBC 4.30 3.80 - 5.20 MIL/uL   Hemoglobin 13.2 12.0 - 16.0 g/dL   HCT 39.2 35.0 - 47.0 %   MCV 91.2 80.0 - 100.0 fL   MCH 30.7 26.0 - 34.0 pg   MCHC 33.7 32.0 - 36.0 g/dL   RDW 13.3 11.5 - 14.5 %   Platelets 230 150 - 440 K/uL  Urine Drug Screen, Qualitative  Result Value Ref Range   Tricyclic, Ur Screen POSITIVE (A) NONE DETECTED   Amphetamines, Ur Screen NONE DETECTED NONE DETECTED   MDMA (Ecstasy)Ur Screen NONE DETECTED NONE DETECTED   Cocaine Metabolite,Ur [place] NONE DETECTED NONE DETECTED   Opiate, Ur Screen NONE DETECTED NONE DETECTED   Phencyclidine (PCP) Ur S NONE DETECTED NONE DETECTED   Cannabinoid 50 Ng, Ur [place] NONE DETECTED NONE DETECTED   Barbiturates, Ur Screen NONE DETECTED NONE DETECTED   Benzodiazepine, Ur Scrn POSITIVE (A) NONE DETECTED   Methadone Scn, Ur NONE DETECTED NONE DETECTED  Troponin I  Result Value Ref Range   Troponin I <0.03 <0.03 ng/mL  Urinalysis, Routine w reflex microscopic  Result Value Ref Range   Color, Urine YELLOW (A) YELLOW   APPearance CLEAR (A) CLEAR   Specific Gravity, Urine 1.025 1.005  - 1.030   pH 5.0 5.0 - 8.0   Glucose, UA NEGATIVE NEGATIVE mg/dL   Hgb urine dipstick SMALL (A) NEGATIVE   Bilirubin Urine NEGATIVE NEGATIVE   Ketones, ur NEGATIVE NEGATIVE mg/dL   Protein, ur NEGATIVE NEGATIVE mg/dL   Nitrite NEGATIVE NEGATIVE   Leukocytes, UA NEGATIVE NEGATIVE   RBC / HPF 0-5 0 - 5 RBC/hpf   WBC, UA 0-5 0 - 5 WBC/hpf   Bacteria, UA NONE SEEN NONE SEEN   Squamous Epithelial / LPF  0-5 (A) NONE SEEN   Mucus PRESENT    Hyaline Casts, UA PRESENT   Blood gas, arterial  Result Value Ref Range   FIO2 0.40    pH, Arterial 7.21 (L) 7.350 - 7.450   pCO2 arterial 63 (H) 32.0 - 48.0 mmHg   pO2, Arterial 95 83.0 - 108.0 mmHg   Bicarbonate 25.2 20.0 - 28.0 mmol/L   Acid-base deficit 3.7 (H) 0.0 - 2.0 mmol/L   O2 Saturation 95.6 %   Patient temperature 37.0    Collection site LEFT RADIAL    Sample type ARTERIAL DRAW    Allens test (pass/fail) PASS PASS   Mechanical Rate 18   CBC  Result Value Ref Range   WBC 10.1 3.6 - 11.0 K/uL   RBC 4.40 3.80 - 5.20 MIL/uL   Hemoglobin 13.5 12.0 - 16.0 g/dL   HCT 40.0 35.0 - 47.0 %   MCV 91.0 80.0 - 100.0 fL   MCH 30.6 26.0 - 34.0 pg   MCHC 33.6 32.0 - 36.0 g/dL   RDW 12.9 11.5 - 14.5 %   Platelets 230 150 - 440 K/uL  Basic metabolic panel  Result Value Ref Range   Sodium 142 135 - 145 mmol/L   Potassium 3.6 3.5 - 5.1 mmol/L   Chloride 113 (H) 101 - 111 mmol/L   CO2 22 22 - 32 mmol/L   Glucose, Bld 111 (H) 65 - 99 mg/dL   BUN 17 6 - 20 mg/dL   Creatinine, Ser 0.81 0.44 - 1.00 mg/dL   Calcium 8.1 (L) 8.9 - 10.3 mg/dL   GFR calc non Af Amer >60 >60 mL/min   GFR calc Af Amer >60 >60 mL/min   Anion gap 7 5 - 15  Blood gas, arterial  Result Value Ref Range   FIO2 0.40    Delivery systems VENTILATOR    Mode PRESSURE REGULATED VOLUME CONTROL    VT 500 mL   LHR 24 resp/min   Peep/cpap 5.0 cm H20   pH, Arterial 7.50 (H) 7.350 - 7.450   pCO2 arterial 29 (L) 32.0 - 48.0 mmHg   pO2, Arterial 160 (H) 83.0 - 108.0 mmHg    Bicarbonate 22.6 20.0 - 28.0 mmol/L   Acid-Base Excess 0.4 0.0 - 2.0 mmol/L   O2 Saturation 99.6 %   Patient temperature 37.0    Collection site LEFT BRACHIAL    Sample type ARTERIAL DRAW   Triglycerides  Result Value Ref Range   Triglycerides 162 (H) <150 mg/dL  Glucose, capillary  Result Value Ref Range   Glucose-Capillary 93 65 - 99 mg/dL  Magnesium  Result Value Ref Range   Magnesium 2.2 1.7 - 2.4 mg/dL  Phosphorus  Result Value Ref Range   Phosphorus 4.6 2.5 - 4.6 mg/dL  Magnesium  Result Value Ref Range   Magnesium 2.3 1.7 - 2.4 mg/dL  Phosphorus  Result Value Ref Range   Phosphorus 4.6 2.5 - 4.6 mg/dL  Triglycerides  Result Value Ref Range   Triglycerides 57 <150 mg/dL  Basic metabolic panel  Result Value Ref Range   Sodium 139 135 - 145 mmol/L   Potassium 3.5 3.5 - 5.1 mmol/L   Chloride 109 101 - 111 mmol/L   CO2 21 (L) 22 - 32 mmol/L   Glucose, Bld 155 (H) 65 - 99 mg/dL   BUN 25 (H) 6 - 20 mg/dL   Creatinine, Ser 0.67 0.44 - 1.00 mg/dL   Calcium 8.1 (L) 8.9 - 10.3 mg/dL  GFR calc non Af Amer >60 >60 mL/min   GFR calc Af Amer >60 >60 mL/min   Anion gap 9 5 - 15  Glucose, capillary  Result Value Ref Range   Glucose-Capillary 182 (H) 65 - 99 mg/dL   Comment 1 Notify RN    Comment 2 Document in Chart   Magnesium  Result Value Ref Range   Magnesium 2.3 1.7 - 2.4 mg/dL  Phosphorus  Result Value Ref Range   Phosphorus 3.6 2.5 - 4.6 mg/dL  Glucose, capillary  Result Value Ref Range   Glucose-Capillary 149 (H) 65 - 99 mg/dL  Glucose, capillary  Result Value Ref Range   Glucose-Capillary 156 (H) 65 - 99 mg/dL  Glucose, capillary  Result Value Ref Range   Glucose-Capillary 157 (H) 65 - 99 mg/dL  Glucose, capillary  Result Value Ref Range   Glucose-Capillary 156 (H) 65 - 99 mg/dL   Comment 1 Notify RN   Glucose, capillary  Result Value Ref Range   Glucose-Capillary 173 (H) 65 - 99 mg/dL  Magnesium  Result Value Ref Range   Magnesium 2.4 1.7 - 2.4  mg/dL  Phosphorus  Result Value Ref Range   Phosphorus 2.8 2.5 - 4.6 mg/dL  Triglycerides  Result Value Ref Range   Triglycerides 1,260 (H) <150 mg/dL  Glucose, capillary  Result Value Ref Range   Glucose-Capillary 175 (H) 65 - 99 mg/dL   Comment 1 Notify RN   Basic metabolic panel  Result Value Ref Range   Sodium 133 (L) 135 - 145 mmol/L   Potassium 4.0 3.5 - 5.1 mmol/L   Chloride 104 101 - 111 mmol/L   CO2 18 (L) 22 - 32 mmol/L   Glucose, Bld 161 (H) 65 - 99 mg/dL   BUN 22 (H) 6 - 20 mg/dL   Creatinine, Ser 0.62 0.44 - 1.00 mg/dL   Calcium 8.0 (L) 8.9 - 10.3 mg/dL   GFR calc non Af Amer >60 >60 mL/min   GFR calc Af Amer >60 >60 mL/min   Anion gap 11 5 - 15  Glucose, capillary  Result Value Ref Range   Glucose-Capillary 165 (H) 65 - 99 mg/dL   Comment 1 Notify RN   Glucose, capillary  Result Value Ref Range   Glucose-Capillary 180 (H) 65 - 99 mg/dL   Comment 1 Notify RN   Glucose, capillary  Result Value Ref Range   Glucose-Capillary 156 (H) 65 - 99 mg/dL  Glucose, capillary  Result Value Ref Range   Glucose-Capillary 118 (H) 65 - 99 mg/dL  Glucose, capillary  Result Value Ref Range   Glucose-Capillary 128 (H) 65 - 99 mg/dL  Glucose, capillary  Result Value Ref Range   Glucose-Capillary 118 (H) 65 - 99 mg/dL  Triglycerides  Result Value Ref Range   Triglycerides 74 <150 mg/dL  CBC  Result Value Ref Range   WBC 7.4 3.6 - 11.0 K/uL   RBC 4.28 3.80 - 5.20 MIL/uL   Hemoglobin 13.2 12.0 - 16.0 g/dL   HCT 39.0 35.0 - 47.0 %   MCV 91.1 80.0 - 100.0 fL   MCH 30.9 26.0 - 34.0 pg   MCHC 33.9 32.0 - 36.0 g/dL   RDW 12.9 11.5 - 14.5 %   Platelets 194 150 - 440 K/uL  Basic metabolic panel  Result Value Ref Range   Sodium 138 135 - 145 mmol/L   Potassium 3.5 3.5 - 5.1 mmol/L   Chloride 104 101 - 111 mmol/L   CO2 25  22 - 32 mmol/L   Glucose, Bld 104 (H) 65 - 99 mg/dL   BUN 18 6 - 20 mg/dL   Creatinine, Ser 0.58 0.44 - 1.00 mg/dL   Calcium 8.2 (L) 8.9 - 10.3 mg/dL     GFR calc non Af Amer >60 >60 mL/min   GFR calc Af Amer >60 >60 mL/min   Anion gap 9 5 - 15  Glucose, capillary  Result Value Ref Range   Glucose-Capillary 95 65 - 99 mg/dL  Glucose, capillary  Result Value Ref Range   Glucose-Capillary 99 65 - 99 mg/dL  Glucose, capillary  Result Value Ref Range   Glucose-Capillary 126 (H) 65 - 99 mg/dL  Basic metabolic panel  Result Value Ref Range   Sodium 132 (L) 135 - 145 mmol/L   Potassium 3.3 (L) 3.5 - 5.1 mmol/L   Chloride 99 (L) 101 - 111 mmol/L   CO2 24 22 - 32 mmol/L   Glucose, Bld 88 65 - 99 mg/dL   BUN 12 6 - 20 mg/dL   Creatinine, Ser 0.58 0.44 - 1.00 mg/dL   Calcium 7.7 (L) 8.9 - 10.3 mg/dL   GFR calc non Af Amer >60 >60 mL/min   GFR calc Af Amer >60 >60 mL/min   Anion gap 9 5 - 15  Basic metabolic panel  Result Value Ref Range   Sodium 137 135 - 145 mmol/L   Potassium 4.8 3.5 - 5.1 mmol/L   Chloride 104 101 - 111 mmol/L   CO2 23 22 - 32 mmol/L   Glucose, Bld 116 (H) 65 - 99 mg/dL   BUN 17 6 - 20 mg/dL   Creatinine, Ser 0.77 0.44 - 1.00 mg/dL   Calcium 8.6 (L) 8.9 - 10.3 mg/dL   GFR calc non Af Amer >60 >60 mL/min   GFR calc Af Amer >60 >60 mL/min   Anion gap 10 5 - 15  CBC  Result Value Ref Range   WBC 7.9 3.6 - 11.0 K/uL   RBC 4.00 3.80 - 5.20 MIL/uL   Hemoglobin 12.2 12.0 - 16.0 g/dL   HCT 36.3 35.0 - 47.0 %   MCV 90.9 80.0 - 100.0 fL   MCH 30.6 26.0 - 34.0 pg   MCHC 33.6 32.0 - 36.0 g/dL   RDW 12.9 11.5 - 14.5 %   Platelets 217 150 - 440 K/uL      Assessment & Plan:   Problem List Items Addressed This Visit      Respiratory   Acute respiratory failure ([place])   Relevant Orders   Ambulatory referral to Pulmonology     Other   Anxiety and depression   Low back pain   Psychophysiological insomnia    Other Visit Diagnoses    Hospital discharge follow-up    -  Primary    #1 Hospital discharge follow-up Patient is improving and is stable with respiratory status at rest on room air.  Patient was  requested to see pulmonology, but has not been seen by them to this point.  Upon chart review, appears referral was not made. Plan: 1.  Continue nebulizer with DuoNeb every 6 hours as needed 2.  Referral to pulmonology 3.  Ask home health RN how to fill and set portable oxygen tank for transport.  Take blood dry because was not set correctly.  Patient stable on room air so safe to go home. 4.  Counseled patient on importance of not taking more medication than was prescribed.  Patient assures  me she was not taking too many pills.  Pill counts today are consistent with this report.  #2  Anxiety and depression with psychophysiological insomnia Currently stable with reduced use of clonazepam during daytime.  Patient is self adjusting to higher dose at bedtime.  Insomnia improved on amitriptyline 10 mg at bedtime.  Moods are stable with controlled anxiety on Effexor. Plan: 1.  Continue reducing clonazepam.  Patient to take half milligrams twice daily as needed for panic attacks only.  After 2 weeks eliminate bedtime dose. 2.  With elimination of clonazepam at bedtime, will increase amitriptyline to 25 mg.  Encouraged patient never to mix clonazepam and amitriptyline at this higher dose of amitriptyline. 3.  Continue follow-up every 4 weeks.  #3  Low back pain Improved currently.  Patient reports very minimal baseline low back pain.  However, patient has been very sedentary since discharge.  Exam today reveals no hypertonicity of paraspinal muscles.  Patient report is that she is not taking baclofen more than 1 time per day.  Amitriptyline also improved patient experiencing back pain. Plan: 1.  Reduce baclofen to 10 mg twice daily as needed 2.  Continue to plan to increase amitriptyline as above 3.  Encouraged slow increase of physical activity to promote strengthening of paraspinal muscles without hypertonicity and strain. 4.  Follow-up 4 weeks    Meds ordered this encounter  Medications  .  DISCONTD: clonazePAM (KLONOPIN) 0.5 MG tablet    Sig: Take 1 tablet (0.5mg ) two times daily as needed for panic attack.  Take 1 tablet for at bedtime for 2 weeks.  Then, stop bedtime dose.    Dispense:  74 tablet    Refill:  0    Order Specific Question:   Supervising Provider    Answer:   Olin Hauser [[id number]]  . baclofen (LIORESAL) 10 MG tablet    Sig: Take 1 tablet (10 mg total) by mouth 2 (two) times daily.    Dispense:  60 each    Refill:  0    Please only fill when pt requests refil.    Order Specific Question:   Supervising Provider    Answer:   Olin Hauser [[id number]]  . amitriptyline (ELAVIL) 25 MG tablet    Sig: Take 1 tablet (25 mg total) by mouth at bedtime.    Dispense:  30 tablet    Refill:  3    Cancel 10 mg prescription    Order Specific Question:   Supervising Provider    Answer:   Olin Hauser [[id number]]    Follow up plan: Return in about 4 weeks (around 12/31/2017) for anxiety/panic, respiratory status.  Cassell Smiles, DNP, AGPCNP-BC Adult Gerontology Primary Care Nurse Practitioner [facility] 01/05/2018, 5:13 PM

## 2017-12-03 NOTE — Patient Instructions (Addendum)
[name], Thank you for coming in to clinic today.  1. Referral to Pulmonology: they will call you for your schedule.   2. Continue clonazepam 0.5 mg one tablet twice daily as needed for panic.  Take 1 tablet at bedtime for 2 weeks, then stop bedtime dose.  We can increase amitriptyline as needed at bedtime.  3. Baclofen one tablet twice daily as needed for spasm.  Please schedule a follow-up appointment with Cassell Smiles, AGNP. Return in about 4 weeks (around 12/31/2017) for anxiety/panic, respiratory status.  If you have any other questions or concerns, please feel free to call the clinic or send a message through [facility]. You may also schedule an earlier appointment if necessary.  You will receive a survey after today's visit either digitally by e-mail or paper by [employer]. Your experiences and feedback matter to [place].  Please respond so we know how we are doing as we provide care for you.   Cassell Smiles, DNP, AGNP-BC Adult Gerontology Nurse Practitioner [place]

## 2017-12-05 DIAGNOSIS — K219 Gastro-esophageal reflux disease without esophagitis: Secondary | ICD-10-CM | POA: Diagnosis not present

## 2017-12-05 DIAGNOSIS — F4310 Post-traumatic stress disorder, unspecified: Secondary | ICD-10-CM | POA: Diagnosis not present

## 2017-12-05 DIAGNOSIS — J441 Chronic obstructive pulmonary disease with (acute) exacerbation: Secondary | ICD-10-CM | POA: Diagnosis not present

## 2017-12-05 DIAGNOSIS — G4730 Sleep apnea, unspecified: Secondary | ICD-10-CM | POA: Diagnosis not present

## 2017-12-05 DIAGNOSIS — F17210 Nicotine dependence, cigarettes, uncomplicated: Secondary | ICD-10-CM | POA: Diagnosis not present

## 2017-12-05 DIAGNOSIS — F339 Major depressive disorder, recurrent, unspecified: Secondary | ICD-10-CM | POA: Diagnosis not present

## 2017-12-05 DIAGNOSIS — J189 Pneumonia, unspecified organism: Secondary | ICD-10-CM | POA: Diagnosis not present

## 2017-12-05 DIAGNOSIS — F411 Generalized anxiety disorder: Secondary | ICD-10-CM | POA: Diagnosis not present

## 2017-12-05 DIAGNOSIS — J440 Chronic obstructive pulmonary disease with acute lower respiratory infection: Secondary | ICD-10-CM | POA: Diagnosis not present

## 2017-12-11 ENCOUNTER — Telehealth: Payer: Self-pay | Admitting: Nurse Practitioner

## 2017-12-11 DIAGNOSIS — F4310 Post-traumatic stress disorder, unspecified: Secondary | ICD-10-CM | POA: Diagnosis not present

## 2017-12-11 DIAGNOSIS — J189 Pneumonia, unspecified organism: Secondary | ICD-10-CM | POA: Diagnosis not present

## 2017-12-11 DIAGNOSIS — F411 Generalized anxiety disorder: Secondary | ICD-10-CM | POA: Diagnosis not present

## 2017-12-11 DIAGNOSIS — F17210 Nicotine dependence, cigarettes, uncomplicated: Secondary | ICD-10-CM | POA: Diagnosis not present

## 2017-12-11 DIAGNOSIS — F339 Major depressive disorder, recurrent, unspecified: Secondary | ICD-10-CM | POA: Diagnosis not present

## 2017-12-11 DIAGNOSIS — G4730 Sleep apnea, unspecified: Secondary | ICD-10-CM | POA: Diagnosis not present

## 2017-12-11 DIAGNOSIS — J441 Chronic obstructive pulmonary disease with (acute) exacerbation: Secondary | ICD-10-CM | POA: Diagnosis not present

## 2017-12-11 DIAGNOSIS — K219 Gastro-esophageal reflux disease without esophagitis: Secondary | ICD-10-CM | POA: Diagnosis not present

## 2017-12-11 DIAGNOSIS — J440 Chronic obstructive pulmonary disease with acute lower respiratory infection: Secondary | ICD-10-CM | POA: Diagnosis not present

## 2017-12-11 NOTE — Telephone Encounter (Signed)
Pt called and informed that [facility] have attempted to contact her to schedule an appt. I gave her the phone number to contact them an schedule an appt.

## 2017-12-11 NOTE — Telephone Encounter (Signed)
Pt called to check status of referral to pulmonary [phone]

## 2017-12-16 ENCOUNTER — Other Ambulatory Visit: Payer: Self-pay | Admitting: Nurse Practitioner

## 2017-12-16 ENCOUNTER — Ambulatory Visit: Payer: Medicare HMO | Admitting: Internal Medicine

## 2017-12-16 ENCOUNTER — Encounter: Payer: Self-pay | Admitting: Internal Medicine

## 2017-12-16 VITALS — BP 124/90 | HR 119 | Ht 62.0 in | Wt 163.0 lb

## 2017-12-16 DIAGNOSIS — J449 Chronic obstructive pulmonary disease, unspecified: Secondary | ICD-10-CM

## 2017-12-16 DIAGNOSIS — F17210 Nicotine dependence, cigarettes, uncomplicated: Secondary | ICD-10-CM

## 2017-12-16 MED ORDER — BUDESONIDE-FORMOTEROL FUMARATE 160-4.5 MCG/ACT IN AERO: 2.0000 | INHALATION_SPRAY | Freq: Two times a day (BID) | RESPIRATORY_TRACT | 12 refills | Status: DC

## 2017-12-16 MED ORDER — ALBUTEROL SULFATE HFA 108 (90 BASE) MCG/ACT IN AERS: 1.0000 | INHALATION_SPRAY | Freq: Four times a day (QID) | RESPIRATORY_TRACT | 5 refills | Status: DC | PRN

## 2017-12-16 NOTE — Telephone Encounter (Signed)
The pt was notified. She verbal understanding, no questions or concerns.

## 2017-12-16 NOTE — Telephone Encounter (Signed)
Pt. Called states that Amitriptyline 25 mg was not strong enough and was requesting you to up doses.

## 2017-12-16 NOTE — Telephone Encounter (Signed)
Doses will not be changed between visits.

## 2017-12-16 NOTE — Progress Notes (Signed)
[facility] Consultation      Assessment and Plan:  Emphysema, group B with recent exacerbation and dyspnea on exertion with chronic hypoxic respiratory failure. -Recent admission to the hospital with COPD exacerbation, discharged with daytime oxygen. - We will start Symbicort inhaler 2 puffs twice daily, also prescribed a rescue inhaler to be used as needed. -Patient was ambulated in the office today, she did not require ambulatory oxygen, therefore oxygen with walking is discontinued today.  Continue nocturnal oxygen.  Nicotine abuse. -Discussed the importance of smoke cessation, spent greater than 3 minutes in discussion.  Meds ordered this encounter  Medications  . budesonide-formoterol (SYMBICORT) 160-4.5 MCG/ACT inhaler    Sig: Inhale 2 puffs into the lungs 2 (two) times daily. Rinse mouth after use    Dispense:  1 Inhaler    Refill:  12  . albuterol (PROVENTIL HFA;VENTOLIN HFA) 108 (90 Base) MCG/ACT inhaler    Sig: Inhale 1-2 puffs into the lungs every 6 (six) hours as needed for wheezing or shortness of breath.    Dispense:  1 Inhaler    Refill:  5   Orders Placed This Encounter  Procedures  . Pulmonary Function Test [facility] Only   Return in about 6 months (around 06/15/2018).     Date: 12/16/2017  MRN# [id number] Kim Buchanan 10/12/1951   Kim Buchanan is a 67 y.o. old female seen in consultation for chief complaint of:    Chief Complaint  Patient presents with  . Advice Only    ref by Cassell Smiles, NP:  . COPD    denies any symptoms    HPI:   The patient is a 67 yo female who was admitted to the hospital for AECOPD and accidental drug overdose requiring intubation, hospitalized from 11/02/17 to 2/1. She was discharged on 2L oxygen day and night. She feels that her breathing is back to baseline. She is smoking 2 cigs per day, when first waking up and with a cup of coffee in the AM.  She is sleepy during the day and takes naps.  She  is on no inhalers. She is on duoneb twice daily and makes her heart race.    Desat walk 12/16/17; on RA at rest. Sat 96% and HR 112. Walked 360 feet mild dyspnea with sat 96% and HR 110.  **CBC 02/24/13; Eos=300.  CXR images personally reviewed; hyperinflation consistent with  COPD   PMHX:   Past Medical History:  Diagnosis Date  . Anxiety   . Arthritis    lower back  . Depression   . GERD (gastroesophageal reflux disease)   . Hyperlipidemia   . Leaky heart valve    sees Dr Humphrey Rolls  . Panic attack   . PTSD (post-traumatic stress disorder)   . Rectal fistula   . Sleep apnea   . Wears dentures    full upper   Surgical Hx:  Past Surgical History:  Procedure Laterality Date  . ABDOMINAL HYSTERECTOMY  10/2015  . COLONOSCOPY  2014  . COLONOSCOPY WITH PROPOFOL N/A 10/01/2016   Procedure: COLONOSCOPY WITH PROPOFOL;  Surgeon: Lucilla Lame, MD;  Location: [place];  Service: Endoscopy;  Laterality: N/A;  . CYSTOCELE REPAIR N/A 12/17/2016   Procedure: ANTERIOR REPAIR (CYSTOCELE);  Surgeon: Brayton Mars, MD;  Location: [facility];  Service: Gynecology;  Laterality: N/A;  . POLYPECTOMY  10/01/2016   Procedure: POLYPECTOMY;  Surgeon: Lucilla Lame, MD;  Location: [facility] [place];  Service: Endoscopy;;  . RECTAL  SURGERY  06/28/2015   Rectal prolapse, laparoscopic rectopexy the coldCharmont, MD; [facility]  . TUBAL LIGATION    . VAGINAL HYSTERECTOMY Bilateral 12/17/2016   Procedure: HYSTERECTOMY VAGINAL WITH BILATERAL SALPINGO OOPHERECTOMY;  Surgeon: Brayton Mars, MD;  Location: [facility];  Service: Gynecology;  Laterality: Bilateral;   Family Hx:  Family History  Problem Relation Age of Onset  . Diabetes Mother   . Diabetes Sister   . Diabetes Brother   . Lymphoma Brother   . Heart disease Father   . Diabetes Sister   . Diabetes Brother   . Lymphoma Brother   . Heart disease Brother   . Healthy Sister   . Breast cancer Neg Hx   . Ovarian cancer Neg Hx   . Colon  cancer Neg Hx    Social Hx:   Social History   Tobacco Use  . Smoking status: Current Every Day Smoker    Packs/day: 0.25    Years: 45.00    Pack years: 11.25    Types: Cigarettes  . Smokeless tobacco: Never Used  . Tobacco comment: started at age 38  Substance Use Topics  . Alcohol use: No    Alcohol/week: 0.0 oz  . Drug use: No   Medication:    Current Outpatient Medications:  .  amitriptyline (ELAVIL) 25 MG tablet, Take 1 tablet (25 mg total) by mouth at bedtime., Disp: 30 tablet, Rfl: 3 .  aspirin 81 MG tablet, Take 81 mg by mouth daily. , Disp: , Rfl:  .  atorvastatin (LIPITOR) 20 MG tablet, Take 1 tablet (20 mg total) by mouth at bedtime., Disp: 30 tablet, Rfl: 5 .  budesonide (PULMICORT) 0.25 MG/2ML nebulizer solution, Take 2 mLs (0.25 mg total) by nebulization 2 (two) times daily., Disp: 60 mL, Rfl: 12 .  clonazePAM (KLONOPIN) 0.5 MG tablet, Take 1 tablet (0.5mg ) two times daily as needed for panic attack.  Take 1 tablet for at bedtime for 2 weeks.  Then, stop bedtime dose., Disp: 74 tablet, Rfl: 0 .  docusate sodium (COLACE) 100 MG capsule, Take 1 capsule (100 mg total) by mouth 2 (two) times daily., Disp: 10 capsule, Rfl: 0 .  guaiFENesin (MUCINEX) 600 MG 12 hr tablet, Take 600 mg by mouth 2 (two) times daily., Disp: , Rfl:  .  ibuprofen (ADVIL,MOTRIN) 800 MG tablet, Take 1 tablet (800 mg total) by mouth 3 (three) times daily., Disp: 50 tablet, Rfl: 1 .  ipratropium-albuterol (DUONEB) 0.5-2.5 (3) MG/3ML SOLN, Take 3 mLs by nebulization every 6 (six) hours., Disp: 360 mL, Rfl: 2 .  isosorbide dinitrate (ISORDIL) 30 MG tablet, Take 1 tablet (30 mg total) by mouth daily., Disp: 30 tablet, Rfl: 0 .  omeprazole (PRILOSEC) 40 MG capsule, Take 1 capsule (40 mg total) by mouth daily., Disp: 90 capsule, Rfl: 1 .  tiotropium (SPIRIVA HANDIHALER) 18 MCG inhalation capsule, Place 1 capsule (18 mcg total) into inhaler and inhale daily., Disp: 30 capsule, Rfl: 2 .  venlafaxine XR  (EFFEXOR-XR) 150 MG 24 hr capsule, Take 1 capsule (150 mg total) by mouth at bedtime., Disp: 90 capsule, Rfl: 1 .  venlafaxine XR (EFFEXOR-XR) 75 MG 24 hr capsule, Take 1 capsule (75 mg total) by mouth at bedtime., Disp: 90 capsule, Rfl: 1   Allergies:  Patient has no known allergies.  Review of Systems: Gen:  Denies  fever, sweats, chills HEENT: Denies blurred vision, double vision. bleeds, sore throat Cvc:  No dizziness, chest pain. Resp:   Denies cough or  sputum production, shortness of breath Gi: Denies swallowing difficulty, stomach pain. Gu:  Denies bladder incontinence, burning urine Ext:   No Joint pain, stiffness. Skin: No skin rash,  hives  Endoc:  No polyuria, polydipsia. Psych: No depression, insomnia. Other:  All other systems were reviewed with the patient and were negative other that what is mentioned in the HPI.   Physical Examination:   VS: BP 124/90 (BP Location: Left Arm, Cuff Size: Normal)   Pulse (!) 119   Ht 5\' 2"  (1.575 m)   Wt 163 lb (73.9 kg)   SpO2 95%   BMI 29.81 kg/m   General Appearance: No distress  Neuro:without focal findings,  speech normal,  HEENT: PERRLA, EOM intact.   Pulmonary: normal breath sounds, No wheezing. Decreased air entry bilaterally.  CardiovascularNormal S1,S2.  No m/r/g.   Abdomen: Benign, Soft, non-tender. Renal:  No costovertebral tenderness  GU:  No performed at this time. Endoc: No evident thyromegaly, no signs of acromegaly. Skin:   warm, no rashes, no ecchymosis  Extremities: normal, no cyanosis, clubbing.  Other findings:    LABORATORY PANEL:   CBC No results for input(s): WBC, HGB, HCT, PLT in the last 168 hours. ------------------------------------------------------------------------------------------------------------------  Chemistries  No results for input(s): NA, K, CL, CO2, GLUCOSE, BUN, CREATININE, CALCIUM, MG, AST, ALT, ALKPHOS, BILITOT in the last 168 hours.  Invalid input(s):  GFRCGP ------------------------------------------------------------------------------------------------------------------  Cardiac Enzymes No results for input(s): TROPONINI in the last 168 hours. ------------------------------------------------------------  RADIOLOGY:  No results found.     Thank  you for the consultation and for allowing [facility] to assist in the care of your patient. Our recommendations are noted above.  Please contact us if we can be of further service.   Marda Stalker, MD.  Board Certified in Internal Medicine, Pulmonary Medicine, [place], and Sleep Medicine.  [facility] Office Number: [phone]  Patricia Pesa, M.D.  Merton Border, M.D  12/16/2017

## 2017-12-16 NOTE — Patient Instructions (Addendum)
Start symbicort inhaler 2 puffs twice daily. Rinse mouth after use.  Will send for PFT.   --Quitting smoking is the most important thing that you can do for your health.  --Quitting smoking will have greater affect on your health than any medicine that we can give you.

## 2017-12-19 DIAGNOSIS — J441 Chronic obstructive pulmonary disease with (acute) exacerbation: Secondary | ICD-10-CM | POA: Diagnosis not present

## 2017-12-19 DIAGNOSIS — F17210 Nicotine dependence, cigarettes, uncomplicated: Secondary | ICD-10-CM | POA: Diagnosis not present

## 2017-12-19 DIAGNOSIS — J189 Pneumonia, unspecified organism: Secondary | ICD-10-CM | POA: Diagnosis not present

## 2017-12-19 DIAGNOSIS — J440 Chronic obstructive pulmonary disease with acute lower respiratory infection: Secondary | ICD-10-CM | POA: Diagnosis not present

## 2017-12-19 DIAGNOSIS — F4310 Post-traumatic stress disorder, unspecified: Secondary | ICD-10-CM | POA: Diagnosis not present

## 2017-12-19 DIAGNOSIS — G4730 Sleep apnea, unspecified: Secondary | ICD-10-CM | POA: Diagnosis not present

## 2017-12-19 DIAGNOSIS — K219 Gastro-esophageal reflux disease without esophagitis: Secondary | ICD-10-CM | POA: Diagnosis not present

## 2017-12-19 DIAGNOSIS — F339 Major depressive disorder, recurrent, unspecified: Secondary | ICD-10-CM | POA: Diagnosis not present

## 2017-12-19 DIAGNOSIS — F411 Generalized anxiety disorder: Secondary | ICD-10-CM | POA: Diagnosis not present

## 2017-12-20 DIAGNOSIS — J449 Chronic obstructive pulmonary disease, unspecified: Secondary | ICD-10-CM | POA: Diagnosis not present

## 2017-12-20 DIAGNOSIS — J9602 Acute respiratory failure with hypercapnia: Secondary | ICD-10-CM | POA: Diagnosis not present

## 2017-12-20 NOTE — Death Summary Note (Deleted)
[place] at [facility], 67 y.o., DOB 1951-06-14, MRN [id number]. Admission date: 11/13/2017 Discharge Date 12/10/2017 Primary MD Mikey College, NP Admitting Physician Lance Coon, MD  Admission Diagnosis  Unresponsiveness [R41.89] Acute respiratory failure, unspecified whether with hypoxia or hypercapnia ([place]) [J96.00]  Discharge Diagnosis   Principal Problem: Acute respiratory failure with hypercapnia  Acute on chronic COPD exasperation Community-acquired pneumonia Depression major recurrent Generalized anxiety disorder GERD        Hospital Course  Kim Buchanan  is a 67 y.o. female who presents obtunded.  Patient was responsive only to painful stimuli on presentation in the ED.  Patient's husband reports that he feels she may have taken more medications than she was prescribed.    Patient was intubated and then subsequently extubated.  It is unclear whether her initial presentation was due to accidental ingestion of her medication or hypercapnia due to COPD exasperation.  Patient to post extubation continue to have hypoxia and difficulty with breathing.  Chest x-ray showed bilateral infiltrates.  She was treated for pneumonia.  She is doing better.  Still requiring oxygen.  She will need to follow-up with her primary care provider to see if she continues to need oxygen as outpatient.  Her mental status is much improved at.  She was also seen by psychiatry as well.              Consults  psychiatry , pulmonary care  Significant Tests:  See full reports for all details    Dg Abd 1 View  Result Date: 11/15/2017 CLINICAL DATA:  OG tube placement. EXAM: ABDOMEN - 1 VIEW COMPARISON:  Single-view of the abdomen 11/14/2017. FINDINGS: OG tube is in place with the tip in the midbody of the stomach. Large colonic stool burden noted. IMPRESSION: OG tube tip is in the stomach. Large colonic stool burden. Electronically Signed   By:  Inge Rise M.D.   On: 11/15/2017 12:44   Ct Head Wo Contrast  Result Date: 11/13/2017 CLINICAL DATA:  Altered mental status EXAM: CT HEAD WITHOUT CONTRAST TECHNIQUE: Contiguous axial images were obtained from the base of the skull through the vertex without intravenous contrast. COMPARISON:  His head CT 12/19/2011 FINDINGS: Brain: No mass lesion, intraparenchymal hemorrhage or extra-axial collection. No evidence of acute cortical infarct. Brain parenchyma and CSF-containing spaces are normal for age. Vascular: No hyperdense vessel or unexpected calcification. Skull: Normal visualized skull base, calvarium and extracranial soft tissues. Sinuses/Orbits: No sinus fluid levels or advanced mucosal thickening. No mastoid effusion. Normal orbits. IMPRESSION: Normal head CT. Electronically Signed   By: Ulyses Jarred M.D.   On: 11/13/2017 20:01   Ct Soft Tissue Neck Wo Contrast  Result Date: 11/15/2017 CLINICAL DATA:  Chronic dyspnea. EXAM: CT NECK WITHOUT CONTRAST TECHNIQUE: Multidetector CT imaging of the neck was performed following the standard protocol without intravenous contrast. COMPARISON:  None. FINDINGS: Pharynx and larynx: The patient is intubated. An NG tube is in place. The cough is below the vocal cords and partially deflated. No definite mucosal or submucosal lesions are present. Salivary glands: Within normal limits. Thyroid: The thyroid is normal. There is edema surrounding the trachea below the vocal cords. Lymph nodes: No significant adenopathy is present. Vascular: No significant vascular lesions are present. Limited intracranial: Within normal limits. Visualized orbits: Globes and orbits are within normal limits as well. Mastoids and visualized paranasal sinuses: The paranasal sinuses and mastoid air cells are clear. Skeleton: Mild endplate changes and  uncovertebral spurring is present at C4-5, C5-6, and C6-7. Vertebral body heights are maintained. Alignment is anatomic. No focal lytic or  blastic lesions are present. Upper chest: Lung apices are clear. IMPRESSION: 1. The patient is intubated. Cuff is partially deflated. Mild edema surrounds the upper trachea without a focal mucosal lesion. No mass lesion is present. There is no defect. 2. Mild degenerative changes in the cervical spine. Electronically Signed   By: San Morelle M.D.   On: 11/15/2017 12:24   Dg Chest Port 1 View  Result Date: 12/09/2017 CLINICAL DATA:  Shortness of breath. Gastroesophageal reflux disease. Smoker. EXAM: PORTABLE CHEST 1 VIEW COMPARISON:  11/20/2017 FINDINGS: Midline trachea. Normal heart size and mediastinal contours. No pleural effusion or pneumothorax. Mild right hemidiaphragm elevation with right pleural thickening. Further improvement in right-sided aeration. Subtle right infrahilar and inferior right upper lobe patchy opacities remain. Mild diffuse interstitial thickening. IMPRESSION: Improvement in patchy right-sided opacities, favoring improving infection. Electronically Signed   By: Abigail Miyamoto M.D.   On: 12/09/2017 08:17   Dg Chest Port 1 View  Result Date: 11/20/2017 CLINICAL DATA:  67 year old female with respiratory failure. EXAM: PORTABLE CHEST 1 VIEW COMPARISON:  11/17/2017 and earlier. FINDINGS: Portable AP upright view at 0439 hrs. Extubated and enteric tube removed. Stable to mildly improved lung volumes. Improved bibasilar ventilation. Normal cardiac size and mediastinal contours. Small volume of pleural fluid along the right minor fissure. Patchy residual right hilar opacity. No pneumothorax or pulmonary edema. IMPRESSION: 1. Extubated and enteric tube removed. 2. Improved ventilation in both lungs and decreased right pleural effusion. Electronically Signed   By: Genevie Ann M.D.   On: 11/20/2017 06:45   Dg Chest Port 1 View  Result Date: 11/17/2017 CLINICAL DATA:  Acute respiratory failure EXAM: PORTABLE CHEST 1 VIEW COMPARISON:  11/13/2017 FINDINGS: Cardiac shadow is stable.  Endotracheal tube and nasogastric catheter are again seen and stable. Elevation the right hemidiaphragm is again noted with right basilar infiltrate and likely small effusion. No bony abnormality is noted. IMPRESSION: New right basilar infiltrate and effusion. Electronically Signed   By: Inez Catalina M.D.   On: 11/17/2017 07:39   Dg Chest Portable 1 View  Result Date: 11/13/2017 CLINICAL DATA:  Endotracheal tube placement EXAM: PORTABLE CHEST 1 VIEW COMPARISON:  11/13/2017 FINDINGS: Endotracheal tube was placed with tip measuring 2.9 cm above the carina. Shallow inspiration with linear atelectasis in the lung bases. Heart size and pulmonary vascularity are normal. No consolidation or airspace disease in the lungs. No blunting of costophrenic angles. No pneumothorax. IMPRESSION: Endotracheal tube appears in satisfactory position. Shallow inspiration with atelectasis in the lung bases. Electronically Signed   By: Lucienne Capers M.D.   On: 11/13/2017 20:58   Dg Chest Portable 1 View  Result Date: 11/13/2017 CLINICAL DATA:  67 year old female with a history altered mental status EXAM: PORTABLE CHEST 1 VIEW COMPARISON:  07/19/2017 FINDINGS: Low lung volumes accentuating the interstitium. Cardiomediastinal silhouette likely unchanged in size and contour. No pneumothorax or pleural effusion. No new confluent airspace disease. Linear/reticular opacity at the left lung base, likely atelectasis. No displaced fracture. IMPRESSION: Low lung volumes without evidence of superimposed acute cardiopulmonary disease. Electronically Signed   By: Corrie Mckusick D.O.   On: 11/13/2017 19:52   Dg Abd Portable 1 View  Result Date: 11/14/2017 CLINICAL DATA:  OG tube placement EXAM: PORTABLE ABDOMEN - 1 VIEW COMPARISON:  CT abdomen and pelvis 05/01/2016 FINDINGS: Enteric tube tip is in the left upper quadrant consistent  with location in the body of the stomach. Gas and ingested material is demonstrated in the stomach. Changes  could reflect gastroparesis or decreased motility. Stool-filled colon. No small or large bowel distention. No radiopaque stones. Postoperative changes in the pelvis. Degenerative changes in the spine. IMPRESSION: Enteric tube tip is in the left upper quadrant consistent with location in the body of the stomach. Gas and ingested material filled the stomach possibly indicating decreased motility. Electronically Signed   By: Lucienne Capers M.D.   On: 11/14/2017 00:51       Today   Subjective:   Karolee Stamps patient breathing much improved Objective:   Blood pressure 124/70, pulse 83, temperature 97.7 F (36.5 C), resp. rate 20, height 5\' 2"  (1.575 m), weight 161 lb 2.5 oz (73.1 kg), SpO2 90 %.  .  Intake/Output Summary (Last 24 hours) at 11/26/2017 1222 Last data filed at 12/11/2017 1004 Gross per 24 hour  Intake 1054.3 ml  Output -  Net 1054.3 ml    Exam VITAL SIGNS: Blood pressure 124/70, pulse 83, temperature 97.7 F (36.5 C), resp. rate 20, height 5\' 2"  (1.575 m), weight 161 lb 2.5 oz (73.1 kg), SpO2 90 %.  GENERAL:  67 y.o.-year-old patient lying in the bed with no acute distress.  EYES: Pupils equal, round, reactive to light and accommodation. No scleral icterus. Extraocular muscles intact.  HEENT: Head atraumatic, normocephalic. Oropharynx and nasopharynx clear.  NECK:  Supple, no jugular venous distention. No thyroid enlargement, no tenderness.  LUNGS: Normal breath sounds bilaterally, no wheezing, rales,rhonchi or crepitation. No use of accessory muscles of respiration.  CARDIOVASCULAR: S1, S2 normal. No murmurs, rubs, or gallops.  ABDOMEN: Soft, nontender, nondistended. Bowel sounds present. No organomegaly or mass.  EXTREMITIES: No pedal edema, cyanosis, or clubbing.  NEUROLOGIC: Cranial nerves II through XII are intact. Muscle strength 5/5 in all extremities. Sensation intact. Gait not checked.  PSYCHIATRIC: The patient is alert and oriented x 3.  SKIN: No obvious rash,  lesion, or ulcer.   Data Review     CBC w Diff:  Lab Results  Component Value Date   WBC 7.9 12/15/2017   HGB 12.2 12/17/2017   HGB 13.1 03/22/2016   HCT 36.3 11/27/2017   HCT 38.8 03/22/2016   PLT 217 12/17/2017   PLT 233 03/22/2016   LYMPHOPCT 37 12/12/2016   LYMPHOPCT 38.2 02/24/2013   MONOPCT 7 12/12/2016   MONOPCT 7.7 02/24/2013   EOSPCT 3 12/12/2016   EOSPCT 3.5 02/24/2013   BASOPCT 1 12/12/2016   BASOPCT 0.6 02/24/2013   CMP:  Lab Results  Component Value Date   NA 137 11/21/2017   NA 141 03/22/2016   NA 143 02/24/2013   K 4.8 11/21/2017   K 4.1 02/24/2013   CL 104 11/21/2017   CL 111 (H) 02/24/2013   CO2 23 11/21/2017   CO2 26 02/24/2013   BUN 17 11/21/2017   BUN 16 03/22/2016   BUN 11 02/24/2013   CREATININE 0.77 11/21/2017   CREATININE 0.75 08/06/2016   PROT 7.1 11/13/2017   PROT 6.3 03/22/2016   PROT 7.2 02/24/2013   ALBUMIN 3.9 11/13/2017   ALBUMIN 3.8 03/22/2016   ALBUMIN 3.6 02/24/2013   BILITOT 0.5 11/13/2017   BILITOT <0.2 03/22/2016   BILITOT 0.4 02/24/2013   ALKPHOS 94 11/13/2017   ALKPHOS 113 02/24/2013   AST 19 11/13/2017   AST 58 (H) 02/24/2013   ALT 18 11/13/2017   ALT 94 (H) 02/24/2013  .  Micro Results  Recent Results (from the past 240 hour(s))  MRSA PCR Screening     Status: None   Collection Time: 11/14/17  1:32 AM  Result Value Ref Range Status   MRSA by PCR NEGATIVE NEGATIVE Final    Comment:        The GeneXpert MRSA Assay (FDA approved for NASAL specimens only), is one component of a comprehensive MRSA colonization surveillance program. It is not intended to diagnose MRSA infection nor to guide or monitor treatment for MRSA infections. Performed at [facility], [address] [place], [place] [address]         Code Status Orders  (From admission, onward)        Start     Ordered   11/14/17 0137  Full code  Continuous     11/14/17 0137    Code Status History    Date Active Date  Inactive Code Status Order ID Comments User Context   12/17/2016 11:46 12/18/2016 18:05 Full Code [id number]  Defrancesco, Alanda Slim, MD Inpatient    Advance Directive Documentation     Most Recent Value  Type of Advance Directive  Living will  Pre-existing out of facility DNR order (yellow form or pink MOST form)  No data  "MOST" Form in Place?  No data            Discharge Medications   Allergies as of 12/05/2017   No Known Allergies     Medication List    TAKE these medications   amitriptyline 10 MG tablet Commonly known as:  ELAVIL Take 1 tablet (10 mg total) by mouth at bedtime.   aspirin 81 MG tablet Take 81 mg by mouth daily.   atorvastatin 20 MG tablet Commonly known as:  LIPITOR Take 1 tablet (20 mg total) by mouth at bedtime.   baclofen 10 MG tablet Commonly known as:  LIORESAL Take 1 tablet (10 mg total) by mouth 4 (four) times daily.   budesonide 0.25 MG/2ML nebulizer solution Commonly known as:  PULMICORT Take 2 mLs (0.25 mg total) by nebulization 2 (two) times daily.   clonazePAM 0.5 MG tablet Commonly known as:  KLONOPIN Take 1 tablet (0.5mg ) during day for panic attack once as needed.  Take 3 tablets (1.5mg ) at night for sleep.   docusate sodium 100 MG capsule Commonly known as:  COLACE Take 1 capsule (100 mg total) by mouth 2 (two) times daily.   guaiFENesin 600 MG 12 hr tablet Commonly known as:  MUCINEX Take 1 tablet (600 mg total) by mouth 2 (two) times daily for 7 days.   ibuprofen 800 MG tablet Commonly known as:  ADVIL,MOTRIN Take 1 tablet (800 mg total) by mouth 3 (three) times daily.   ipratropium-albuterol 0.5-2.5 (3) MG/3ML Soln Commonly known as:  DUONEB Take 3 mLs by nebulization every 6 (six) hours.   isosorbide dinitrate 30 MG tablet Commonly known as:  ISORDIL Take 1 tablet (30 mg total) by mouth daily.   levofloxacin 500 MG tablet Commonly known as:  LEVAQUIN Take 1 tablet (500 mg total) by mouth daily for 5  days. Start taking on:  11/23/2017   omeprazole 40 MG capsule Commonly known as:  PRILOSEC Take 1 capsule (40 mg total) by mouth daily.   polyethylene glycol packet Commonly known as:  MIRALAX / GLYCOLAX Take 17 g by mouth daily as needed.   predniSONE 10 MG (21) Tbpk tablet Commonly known as:  STERAPRED UNI-PAK 21 TAB Start at 60mg  taper by 10mg   until complete   senna 8.6 MG Tabs tablet Commonly known as:  SENOKOT Take 2 tablets by mouth daily as needed for mild constipation.   tiotropium 18 MCG inhalation capsule Commonly known as:  SPIRIVA HANDIHALER Place 1 capsule (18 mcg total) into inhaler and inhale daily.   venlafaxine XR 75 MG 24 hr capsule Commonly known as:  EFFEXOR-XR Take 1 capsule (75 mg total) by mouth at bedtime.   venlafaxine XR 150 MG 24 hr capsule Commonly known as:  EFFEXOR-XR Take 1 capsule (150 mg total) by mouth at bedtime.            Durable Medical Equipment  (From admission, onward)        Start     Ordered   12/16/2017 1037  For home use only DME Nebulizer machine  Once    Question:  Patient needs a nebulizer to treat with the following condition  Answer:  COPD (chronic obstructive pulmonary disease) ([place])   12/19/2017 1036         Total Time in preparing paper work, data evaluation and todays exam - 75 minutes  Dustin Flock M.D on 12/07/2017 at 12:22 PM  [place]  [phone]

## 2017-12-20 DEATH — deceased

## 2017-12-23 DIAGNOSIS — J449 Chronic obstructive pulmonary disease, unspecified: Secondary | ICD-10-CM | POA: Diagnosis not present

## 2017-12-23 DIAGNOSIS — J9602 Acute respiratory failure with hypercapnia: Secondary | ICD-10-CM | POA: Diagnosis not present

## 2017-12-25 ENCOUNTER — Telehealth: Payer: Self-pay | Admitting: Nurse Practitioner

## 2017-12-25 DIAGNOSIS — G4730 Sleep apnea, unspecified: Secondary | ICD-10-CM | POA: Diagnosis not present

## 2017-12-25 DIAGNOSIS — F339 Major depressive disorder, recurrent, unspecified: Secondary | ICD-10-CM | POA: Diagnosis not present

## 2017-12-25 DIAGNOSIS — J440 Chronic obstructive pulmonary disease with acute lower respiratory infection: Secondary | ICD-10-CM | POA: Diagnosis not present

## 2017-12-25 DIAGNOSIS — J441 Chronic obstructive pulmonary disease with (acute) exacerbation: Secondary | ICD-10-CM | POA: Diagnosis not present

## 2017-12-25 DIAGNOSIS — F17210 Nicotine dependence, cigarettes, uncomplicated: Secondary | ICD-10-CM | POA: Diagnosis not present

## 2017-12-25 DIAGNOSIS — J189 Pneumonia, unspecified organism: Secondary | ICD-10-CM | POA: Diagnosis not present

## 2017-12-25 DIAGNOSIS — F4310 Post-traumatic stress disorder, unspecified: Secondary | ICD-10-CM | POA: Diagnosis not present

## 2017-12-25 DIAGNOSIS — K219 Gastro-esophageal reflux disease without esophagitis: Secondary | ICD-10-CM | POA: Diagnosis not present

## 2017-12-25 DIAGNOSIS — F411 Generalized anxiety disorder: Secondary | ICD-10-CM | POA: Diagnosis not present

## 2017-12-25 NOTE — Telephone Encounter (Signed)
Pt needs someone to notify [facility] to pick up oxygen tanks.  Her call back number is [phone]

## 2017-12-26 ENCOUNTER — Telehealth: Payer: Self-pay | Admitting: Internal Medicine

## 2017-12-26 DIAGNOSIS — J449 Chronic obstructive pulmonary disease, unspecified: Secondary | ICD-10-CM

## 2017-12-26 NOTE — Telephone Encounter (Signed)
Patients husband calling to say that patient has been prescribed inhalers and has been off of oxygen for 4 days  They will need a confirmation from Dr Juanell Fairly for [place] to come and pick up the machines they no longer need - they would like ASAP since they are being charged for machines Please advise [place] at [phone]

## 2017-12-26 NOTE — Telephone Encounter (Signed)
Order has been placed to d/c 02 therapy. Nothing further needed.

## 2017-12-26 NOTE — Telephone Encounter (Signed)
The pt was informed that she needs to contact her pulmonologist and have them to d/c her oxygen since they stopped it. She verbalize understanding, no questions or concerns.

## 2017-12-26 NOTE — Telephone Encounter (Signed)
Yes, may discontinue oxygen.

## 2017-12-27 NOTE — Telephone Encounter (Signed)
Pt states [facility] states they have not received the orders to D/C pt oxygen

## 2017-12-27 NOTE — Telephone Encounter (Signed)
Called patient's husband back and left message to let them know order was placed. Staff was sent per [facility] to d/c 02 therapy. Nothing further needed.

## 2018-01-01 ENCOUNTER — Other Ambulatory Visit: Payer: Self-pay | Admitting: Nurse Practitioner

## 2018-01-01 ENCOUNTER — Ambulatory Visit (INDEPENDENT_AMBULATORY_CARE_PROVIDER_SITE_OTHER): Payer: Self-pay | Admitting: Nurse Practitioner

## 2018-01-01 ENCOUNTER — Other Ambulatory Visit: Payer: Self-pay

## 2018-01-01 ENCOUNTER — Encounter: Payer: Self-pay | Admitting: Nurse Practitioner

## 2018-01-01 VITALS — BP 123/69 | HR 93 | Temp 97.7°F | Resp 20 | Ht 62.0 in | Wt 175.6 lb

## 2018-01-01 DIAGNOSIS — R799 Abnormal finding of blood chemistry, unspecified: Secondary | ICD-10-CM

## 2018-01-01 DIAGNOSIS — G8929 Other chronic pain: Secondary | ICD-10-CM

## 2018-01-01 DIAGNOSIS — I25118 Atherosclerotic heart disease of native coronary artery with other forms of angina pectoris: Secondary | ICD-10-CM

## 2018-01-01 DIAGNOSIS — E782 Mixed hyperlipidemia: Secondary | ICD-10-CM

## 2018-01-01 DIAGNOSIS — M545 Low back pain, unspecified: Secondary | ICD-10-CM

## 2018-01-01 DIAGNOSIS — F5104 Psychophysiologic insomnia: Secondary | ICD-10-CM

## 2018-01-01 DIAGNOSIS — F32A Depression, unspecified: Secondary | ICD-10-CM

## 2018-01-01 DIAGNOSIS — F419 Anxiety disorder, unspecified: Secondary | ICD-10-CM

## 2018-01-01 DIAGNOSIS — F329 Major depressive disorder, single episode, unspecified: Secondary | ICD-10-CM

## 2018-01-01 MED ORDER — CLONAZEPAM 0.5 MG PO TABS: 0.5000 mg | ORAL_TABLET | Freq: Two times a day (BID) | ORAL | 2 refills | Status: DC | PRN

## 2018-01-01 MED ORDER — ISOSORBIDE DINITRATE 30 MG PO TABS: 30.0000 mg | ORAL_TABLET | Freq: Every day | ORAL | 0 refills | Status: DC

## 2018-01-01 MED ORDER — ATORVASTATIN CALCIUM 20 MG PO TABS: 20.0000 mg | ORAL_TABLET | Freq: Every day | ORAL | 5 refills | Status: DC

## 2018-01-01 MED ORDER — AMITRIPTYLINE HCL 25 MG PO TABS: 25.0000 mg | ORAL_TABLET | Freq: Every day | ORAL | 3 refills | Status: DC

## 2018-01-01 NOTE — Patient Instructions (Addendum)
Kim Buchanan, Thank you for coming in to clinic today.  1. Continue clonazepam 0.5 mg twice daily as needed for panic.  2. Continue amitriptyline 25 mg once daily at bedtime. - Work on sleep schedule.  If after 4 weeks you are still having trouble with sleep, we can increase this dose.  Sleep Hygiene Tips  Take medicines only as directed by your health care provider.  Keep regular sleeping and waking hours. Avoid naps.  Keep a sleep diary to help you and your health care provider figure out what could be causing your insomnia. Include:  When you sleep.  When you wake up during the night.  How well you sleep.  How rested you feel the next day.  Any side effects of medicines you are taking.  What you eat and drink.  Make your bedroom a comfortable place where it is easy to fall asleep:  Put up shades or special blackout curtains to block light from outside.  Use a white noise machine to block noise.  Keep the temperature cool.  Exercise regularly as directed by your health care provider. Avoid exercising right before bedtime.  Use relaxation techniques to manage stress. Ask your health care provider to suggest some techniques that may work well for you. These may include:  Breathing exercises.  Routines to release muscle tension.  Visualizing peaceful scenes.  Cut back on alcohol, caffeinated beverages, and cigarettes, especially close to bedtime. These can disrupt your sleep.  Do not overeat or eat spicy foods right before bedtime. This can lead to digestive discomfort that can make it hard for you to sleep.  Limit screen use before bedtime. This includes:  Watching TV.  Using your smartphone, tablet, and computer.  Stick to a routine. This can help you fall asleep faster. Try to do a quiet activity, brush your teeth, and go to bed at the same time each night.  Get out of bed if you are still awake after 15 minutes of trying to sleep. Keep the lights down, but try  reading or doing a quiet activity. When you feel sleepy, go back to bed.  Make sure that you drive carefully. Avoid driving if you feel very sleepy.  Keep all follow-up appointments as directed by your health care provider. This is important.    Please schedule a follow-up appointment with Cassell Smiles, AGNP. Return in about 3 months (around 04/03/2018) for anxiety and insomnia.  If you have any other questions or concerns, please feel free to call the clinic or send a message through [facility]. You may also schedule an earlier appointment if necessary.  You will receive a survey after today's visit either digitally by e-mail or paper by [employer]. Your experiences and feedback matter to [place].  Please respond so we know how we are doing as we provide care for you.   Cassell Smiles, DNP, AGNP-BC Adult Gerontology Nurse Practitioner [facility], [facility]   Low Back Pain Exercises See other page with pictures of each exercise.  Start with 1 or 2 of these exercises that you are most comfortable with. Do not do any exercises that cause you significant worsening pain. Some of these may cause some "stretching soreness" but it should go away after you stop the exercise, and get better over time. Gradually increase up to 3-4 exercises as tolerated.  Standing hamstring stretch: Place the heel of your leg on a stool about 15 inches high. Keep your knee straight. Lean forward, bending at the  hips until you feel a mild stretch in the back of your thigh. Make sure you do not roll your shoulders and bend at the waist when doing this or you will stretch your lower back instead. Hold the stretch for 15 to 30 seconds. Repeat 3 times. Repeat the same stretch on your other leg.  Cat and camel: Get down on your hands and knees. Let your stomach sag, allowing your back to curve downward. Hold this position for 5 seconds. Then arch your back and hold for 5 seconds. Do 3 sets of 10.  Quadriped Arm/Leg  Raises: Get down on your hands and knees. Tighten your abdominal muscles to stiffen your spine. While keeping your abdominals tight, raise one arm and the opposite leg away from you. Hold this position for 5 seconds. Lower your arm and leg slowly and alternate sides. Do this 10 times on each side.  Pelvic tilt: Lie on your back with your knees bent and your feet flat on the floor. Tighten your abdominal muscles and push your lower back into the floor. Hold this position for 5 seconds, then relax. Do 3 sets of 10.  Partial curl: Lie on your back with your knees bent and your feet flat on the floor. Tighten your stomach muscles and flatten your back against the floor. Tuck your chin to your chest. With your hands stretched out in front of you, curl your upper body forward until your shoulders clear the floor. Hold this position for 3 seconds. Don't hold your breath. It helps to breathe out as you lift your shoulders up. Relax. Repeat 10 times. Build to 3 sets of 10. To challenge yourself, clasp your hands behind your head and keep your elbows out to the side.  Lower trunk rotation: Lie on your back with your knees bent and your feet flat on the floor. Tighten your abdominal muscles and push your lower back into the floor. Keeping your shoulders down flat, gently rotate your legs to one side, then the other as far as you can. Repeat 10 to 20 times.  Single knee to chest stretch: Lie on your back with your legs straight out in front of you. Bring one knee up to your chest and grasp the back of your thigh. Pull your knee toward your chest, stretching your buttock muscle. Hold this position for 15 to 30 seconds and return to the starting position. Repeat 3 times on each side.  Double knee to chest: Lie on your back with your knees bent and your feet flat on the floor. Tighten your abdominal muscles and push your lower back into the floor. Pull both knees up to your chest. Hold for 5 seconds and repeat 10 to 20  times.

## 2018-01-01 NOTE — Progress Notes (Signed)
Subjective:    Patient ID: Kim Buchanan, female    DOB: September 05, 1951, 66 y.o.   MRN: [id number]  Kim Buchanan is a 67 y.o. female presenting on 01/01/2018 for Anxiety (DEPRESSION) and Insomnia ( issues falling sleep & staying asleep )   HPI Anxiety and Depression - Reduced pt to 0.5 mg tablet one tid and eliminate bedtime dose after 2 weeks.  Pt will have 1/2 tablet bid for panic.  - Pt states she wants to have better moods, but does not describe any additional symptoms.  Home health comes to check her and gets nervous.  Pt reports clonazepam is helpful in this situation.   Insomnia Pt not reports problems falling asleep and staying asleep. Is sleeping too much during the day.  Not sleeping much at night. - Lays down at 7 pm - watching TV, uses for background noise and turns off 1-2 am.  Falls asleep while TV is on sometimes.  No trouble staying asleep once falling asleep, but sometimes wakes early.  Falls asleep after eating a full meal regularly.   - Takes amitriptyline at 9 pm, but keeps TV on.  Sometimes is still awake 2-3 hours after taking, but pt admits it was 2/2 sleep during the day. - Amitriptyline 25 mg once daily is helping some.  Pt admits she has occasionally taken amitriptyline 50 mg ad bedtime and falls asleep quickly (within 30 minutes) when she takes the higher dose.   GAD 7 : Generalized Anxiety Score 01/01/2018 11/06/2017 10/07/2017  Nervous, Anxious, on Edge 3 1 1   Control/stop worrying 3 3 1   Worry too much - different things 3 3 1   Trouble relaxing 3 3 0  Restless 3 3 0  Easily annoyed or irritable 3 3 1   Afraid - awful might happen 3 2 1   Total GAD 7 Score 21 18 5   Anxiety Difficulty Extremely difficult Somewhat difficult Somewhat difficult    Depression screen [facility] 2/9 01/01/2018 11/06/2017 10/07/2017 09/03/2017 08/15/2017  Decreased Interest 3 1 3  0 0  Down, Depressed, Hopeless 0 1 3 0 0  PHQ - 2 Score 3 2 6  0 0  Altered sleeping 3 2 3  - -  Tired,  decreased energy 3 3 3  - -  Change in appetite 2 3 3  - -  Feeling bad or failure about yourself  0 0 0 - -  Trouble concentrating 0 0 0 - -  Moving slowly or fidgety/restless 3 3 3  - -  Suicidal thoughts 0 0 0 - -  PHQ-9 Score 14 13 18  - -  Difficult doing work/chores Somewhat difficult Somewhat difficult Somewhat difficult - -     Social History   Tobacco Use  . Smoking status: Current Every Day Smoker    Packs/day: 0.25    Years: 45.00    Pack years: 11.25    Types: Cigarettes  . Smokeless tobacco: Never Used  . Tobacco comment: started at age 60  Substance Use Topics  . Alcohol use: No    Alcohol/week: 0.0 oz  . Drug use: No    Review of Systems Per HPI unless specifically indicated above     Objective:    BP 123/69 (BP Location: Right Arm, Patient Position: Sitting, Cuff Size: Normal)   Pulse 93   Temp 97.7 F (36.5 C) (Oral)   Resp 20   Ht 5\' 2"  (1.575 m)   Wt 175 lb 9.6 oz (79.7 kg)   SpO2 99%   BMI  32.12 kg/m   Wt Readings from Last 3 Encounters:  01/07/18 184 lb 11.9 oz (83.8 kg)  01/01/18 175 lb 9.6 oz (79.7 kg)  12/16/17 163 lb (73.9 kg)    Physical Exam General - obese, well-appearing, NAD HEENT - Normocephalic, atraumatic Neck - supple, non-tender, no LAD, no thyromegaly, no carotid bruit Heart - RRR, no murmurs heard Lungs - Clear throughout all lobes, no wheezing, crackles, or rhonchi. Normal work of breathing.  Pt on RA today. Extremeties - non-tender, no edema, cap refill < 2 seconds, peripheral pulses intact +2 bilaterally Skin - warm, dry Neuro - awake, alert, oriented x3, normal gait Psych - Normal mood and affect, normal behavior    Results for orders placed or performed in visit on 01/01/18  Lipid panel  Result Value Ref Range   Cholesterol 108 <200 mg/dL   HDL 65 >50 mg/dL   Triglycerides 49 <150 mg/dL   LDL Cholesterol (Calc) 29 mg/dL (calc)   Total CHOL/HDL Ratio 1.7 <5.0 (calc)   Non-HDL Cholesterol (Calc) 43 <130 mg/dL (calc)   COMPLETE METABOLIC PANEL WITH GFR  Result Value Ref Range   Glucose, Bld 80 65 - 99 mg/dL   BUN 24 7 - 25 mg/dL   Creat 0.82 0.50 - 0.99 mg/dL   GFR, Est Non African American 76 > OR = 60 mL/min/1.66m2   GFR, Est African American 88 > OR = 60 mL/min/1.14m2   BUN/Creatinine Ratio NOT APPLICABLE 6 - 22 (calc)   Sodium 140 135 - 146 mmol/L   Potassium 4.3 3.5 - 5.3 mmol/L   Chloride 107 98 - 110 mmol/L   CO2 26 20 - 32 mmol/L   Calcium 8.8 8.6 - 10.4 mg/dL   Total Protein 6.8 6.1 - 8.1 g/dL   Albumin 4.1 3.6 - 5.1 g/dL   Globulin 2.7 1.9 - 3.7 g/dL (calc)   AG Ratio 1.5 1.0 - 2.5 (calc)   Total Bilirubin 0.4 0.2 - 1.2 mg/dL   Alkaline phosphatase (APISO) 92 33 - 130 U/L   AST 21 10 - 35 U/L   ALT 38 (H) 6 - 29 U/L  Hemoglobin A1c  Result Value Ref Range   Hgb A1c MFr Bld 5.4 <5.7 % of total Hgb   Mean Plasma Glucose 108 (calc)   eAG (mmol/L) 6.0 (calc)      Assessment & Plan:   Problem List Items Addressed This Visit      Other   Anxiety and depression - Primary    Patient continues to report stable moods.  Symptom control is not optimal for patient.  However, symptoms have not worsened.  On exam today, patient is more withdrawn and is not willing to discuss her symptoms openly.  Since December gad 7 scores have increased steadily.  PHQ 9 scores remain stable.  Plan: 1.  Continue medications without changes today.  Continue reduction of clonazepam to half milligrams twice daily as needed for panic.  Patient verbalizes understanding.  Patient also now at safe regimen to continue for panic disorder given patient does not have any additional overdoses.  Discussion with patient in clinic today reveals that concern for overdose may be unfounded.  Patient continues to deny she took additional medications. 2.  Reviewed controlled substance contract with patient today. 3.  Follow-up 4 weeks.  Consider counseling.  Consider referral to psychiatry.  Patient has continued to decline these  referrals.      Relevant Medications   clonazePAM (KLONOPIN) 0.5 MG tablet  amitriptyline (ELAVIL) 25 MG tablet   Low back pain    Pt has long history of chronic back pain and prior interventional pain management treatment.  Pt declines neurosurgery options as well.  Has previously been managed only w/ opioid pain medication, but has now been off for several weeks.  Pt w/ significantly improved hypertonicity of paraspinal muscles with decreased activity level.   Plan: 1. Discussed no chronic opioids from this clinic.  Pt verbalizes understanding. 2. May only take total of 20 mg baclofen daily as she has not needed higher doses in the last several weeks.  Patient should consider at lower dose and should only take if having back spasms. 3. Encouraged daily stretching as tolerated and consideration of PT. 4. Followup 4 weeks.      Relevant Medications   amitriptyline (ELAVIL) 25 MG tablet   Hyperlipidemia    Patient without recent evaluation of lipid status.  Patient with prior known hyperlipidemia.  Obtaining labs today, so we will also check lipid panel.  Medication adjustments to follow.      Relevant Orders   Lipid panel (Completed)   Psychophysiological insomnia    Patient with ongoing difficulty with insomnia.  She reports having action of amitriptyline with assistance for sleep onset within 1 hour of taking it.  Patient admits to taking to the amitriptyline occasionally and falling asleep very quickly.  She requests that this be increased today.  Upon review of sleep hygiene, patient is falling asleep in bed room with TV on.  She keeps a standard bedtime routine medications about 1 hour before she falls asleep.  This provider is unsure of the goal patient is trying to accomplish with medications at bedtime.  Plan: 1.  Continue amitriptyline 25 mg at bedtime.  No dose increase will be considered today. 2.  Encouraged significant work on sleep hygiene with reducing screen time prior  to falling asleep. 3.  In future, consider change from amitriptyline to trazodone.  Patient continues amitriptyline today after discussion of options for impact amitriptyline is having on back pain. 4.  Follow-up 4 weeks      Relevant Medications   amitriptyline (ELAVIL) 25 MG tablet    Other Visit Diagnoses    Abnormal blood chemistry       Relevant Orders   Lipid panel (Completed)   COMPLETE METABOLIC PANEL WITH GFR (Completed)   Hemoglobin A1c (Completed)      Meds ordered this encounter  Medications  . clonazePAM (KLONOPIN) 0.5 MG tablet    Sig: Take 1 tablet (0.5 mg total) by mouth 2 (two) times daily as needed for anxiety.    Dispense:  60 tablet    Refill:  2    Order Specific Question:   Supervising Provider    Answer:   Olin Hauser [[id number]]  . amitriptyline (ELAVIL) 25 MG tablet    Sig: Take 1 tablet (25 mg total) by mouth at bedtime.    Dispense:  30 tablet    Refill:  3    Order Specific Question:   Supervising Provider    Answer:   Olin Hauser [[id number]]   Follow up plan: Return in about 3 months (around 04/03/2018) for anxiety and insomnia.  A total of 30 minutes was spent face-to-face with this patient. Greater than 50% of this time was spent in counseling and coordination of care with the patient.    Cassell Smiles, DNP, AGPCNP-BC Adult Gerontology Primary Care Nurse Practitioner [facility] 01/19/2018, 2:59 PM

## 2018-01-02 LAB — LIPID PANEL
Cholesterol: 108 mg/dL (ref <200)
HDL: 65 mg/dL (ref >50)
LDL Cholesterol (Calc): 29 mg/dL (calc)
Non-HDL Cholesterol (Calc): 43 mg/dL (calc) (ref <130)
Total CHOL/HDL Ratio: 1.7 (calc) (ref <5.0)
Triglycerides: 49 mg/dL (ref <150)

## 2018-01-02 LAB — HEMOGLOBIN A1C
Hgb A1c MFr Bld: 5.4 % of total Hgb (ref <5.7)
Mean Plasma Glucose: 108 (calc)
eAG (mmol/L): 6 (calc)

## 2018-01-02 LAB — COMPLETE METABOLIC PANEL WITH GFR
AG Ratio: 1.5 (calc) (ref 1.0–2.5)
ALT: 38 U/L — ABNORMAL HIGH (ref 6–29)
AST: 21 U/L (ref 10–35)
Albumin: 4.1 g/dL (ref 3.6–5.1)
Alkaline phosphatase (APISO): 92 U/L (ref 33–130)
BUN: 24 mg/dL (ref 7–25)
CO2: 26 mmol/L (ref 20–32)
Calcium: 8.8 mg/dL (ref 8.6–10.4)
Chloride: 107 mmol/L (ref 98–110)
Creat: 0.82 mg/dL (ref 0.50–0.99)
GFR, Est African American: 88 mL/min/{1.73_m2} (ref >=60)
GFR, Est Non African American: 76 mL/min/{1.73_m2} (ref >=60)
Globulin: 2.7 g/dL (calc) (ref 1.9–3.7)
Glucose, Bld: 80 mg/dL (ref 65–99)
Potassium: 4.3 mmol/L (ref 3.5–5.3)
Sodium: 140 mmol/L (ref 135–146)
Total Bilirubin: 0.4 mg/dL (ref 0.2–1.2)
Total Protein: 6.8 g/dL (ref 6.1–8.1)

## 2018-01-03 DIAGNOSIS — J189 Pneumonia, unspecified organism: Secondary | ICD-10-CM | POA: Diagnosis not present

## 2018-01-03 DIAGNOSIS — F4310 Post-traumatic stress disorder, unspecified: Secondary | ICD-10-CM | POA: Diagnosis not present

## 2018-01-03 DIAGNOSIS — K219 Gastro-esophageal reflux disease without esophagitis: Secondary | ICD-10-CM | POA: Diagnosis not present

## 2018-01-03 DIAGNOSIS — R4182 Altered mental status, unspecified: Secondary | ICD-10-CM | POA: Diagnosis not present

## 2018-01-03 DIAGNOSIS — F411 Generalized anxiety disorder: Secondary | ICD-10-CM | POA: Diagnosis not present

## 2018-01-03 DIAGNOSIS — F339 Major depressive disorder, recurrent, unspecified: Secondary | ICD-10-CM | POA: Diagnosis not present

## 2018-01-03 DIAGNOSIS — J440 Chronic obstructive pulmonary disease with acute lower respiratory infection: Secondary | ICD-10-CM | POA: Diagnosis not present

## 2018-01-03 DIAGNOSIS — G4730 Sleep apnea, unspecified: Secondary | ICD-10-CM | POA: Diagnosis not present

## 2018-01-03 DIAGNOSIS — J441 Chronic obstructive pulmonary disease with (acute) exacerbation: Secondary | ICD-10-CM | POA: Diagnosis not present

## 2018-01-03 DIAGNOSIS — F17210 Nicotine dependence, cigarettes, uncomplicated: Secondary | ICD-10-CM | POA: Diagnosis not present

## 2018-01-04 ENCOUNTER — Inpatient Hospital Stay: Payer: Medicare HMO

## 2018-01-04 ENCOUNTER — Emergency Department: Payer: Medicare HMO

## 2018-01-04 ENCOUNTER — Other Ambulatory Visit: Payer: Self-pay

## 2018-01-04 ENCOUNTER — Encounter: Payer: Self-pay | Admitting: Emergency Medicine

## 2018-01-04 ENCOUNTER — Inpatient Hospital Stay
Admission: EM | Admit: 2018-01-04 | Discharge: 2018-01-10 | DRG: 917 | Disposition: A | Discharge status: Home Health | Payer: Medicare HMO | Attending: Family Medicine | Admitting: Family Medicine

## 2018-01-04 DIAGNOSIS — J9690 Respiratory failure, unspecified, unspecified whether with hypoxia or hypercapnia: Secondary | ICD-10-CM | POA: Diagnosis not present

## 2018-01-04 DIAGNOSIS — F411 Generalized anxiety disorder: Secondary | ICD-10-CM | POA: Diagnosis present

## 2018-01-04 DIAGNOSIS — R1310 Dysphagia, unspecified: Secondary | ICD-10-CM | POA: Diagnosis not present

## 2018-01-04 DIAGNOSIS — J9811 Atelectasis: Secondary | ICD-10-CM | POA: Diagnosis not present

## 2018-01-04 DIAGNOSIS — E870 Hyperosmolality and hypernatremia: Secondary | ICD-10-CM | POA: Diagnosis not present

## 2018-01-04 DIAGNOSIS — K219 Gastro-esophageal reflux disease without esophagitis: Secondary | ICD-10-CM | POA: Diagnosis not present

## 2018-01-04 DIAGNOSIS — J690 Pneumonitis due to inhalation of food and vomit: Secondary | ICD-10-CM | POA: Diagnosis not present

## 2018-01-04 DIAGNOSIS — G4733 Obstructive sleep apnea (adult) (pediatric): Secondary | ICD-10-CM | POA: Diagnosis present

## 2018-01-04 DIAGNOSIS — J9600 Acute respiratory failure, unspecified whether with hypoxia or hypercapnia: Secondary | ICD-10-CM | POA: Diagnosis not present

## 2018-01-04 DIAGNOSIS — Z7982 Long term (current) use of aspirin: Secondary | ICD-10-CM

## 2018-01-04 DIAGNOSIS — R4182 Altered mental status, unspecified: Secondary | ICD-10-CM

## 2018-01-04 DIAGNOSIS — R739 Hyperglycemia, unspecified: Secondary | ICD-10-CM | POA: Diagnosis not present

## 2018-01-04 DIAGNOSIS — T43011A Poisoning by tricyclic antidepressants, accidental (unintentional), initial encounter: Secondary | ICD-10-CM | POA: Diagnosis not present

## 2018-01-04 DIAGNOSIS — J9601 Acute respiratory failure with hypoxia: Secondary | ICD-10-CM | POA: Diagnosis present

## 2018-01-04 DIAGNOSIS — I959 Hypotension, unspecified: Secondary | ICD-10-CM | POA: Diagnosis not present

## 2018-01-04 DIAGNOSIS — R4020 Unspecified coma: Secondary | ICD-10-CM | POA: Diagnosis not present

## 2018-01-04 DIAGNOSIS — Z452 Encounter for adjustment and management of vascular access device: Secondary | ICD-10-CM

## 2018-01-04 DIAGNOSIS — F4310 Post-traumatic stress disorder, unspecified: Secondary | ICD-10-CM | POA: Diagnosis present

## 2018-01-04 DIAGNOSIS — F17210 Nicotine dependence, cigarettes, uncomplicated: Secondary | ICD-10-CM | POA: Diagnosis present

## 2018-01-04 DIAGNOSIS — K5900 Constipation, unspecified: Secondary | ICD-10-CM | POA: Diagnosis not present

## 2018-01-04 DIAGNOSIS — G9340 Encephalopathy, unspecified: Secondary | ICD-10-CM | POA: Diagnosis not present

## 2018-01-04 DIAGNOSIS — R579 Shock, unspecified: Secondary | ICD-10-CM | POA: Diagnosis not present

## 2018-01-04 DIAGNOSIS — E872 Acidosis, unspecified: Secondary | ICD-10-CM

## 2018-01-04 DIAGNOSIS — Z791 Long term (current) use of non-steroidal anti-inflammatories (NSAID): Secondary | ICD-10-CM | POA: Diagnosis not present

## 2018-01-04 DIAGNOSIS — R000 Tachycardia, unspecified: Secondary | ICD-10-CM | POA: Diagnosis not present

## 2018-01-04 DIAGNOSIS — F329 Major depressive disorder, single episode, unspecified: Secondary | ICD-10-CM | POA: Diagnosis present

## 2018-01-04 DIAGNOSIS — Z90710 Acquired absence of both cervix and uterus: Secondary | ICD-10-CM | POA: Diagnosis not present

## 2018-01-04 DIAGNOSIS — D649 Anemia, unspecified: Secondary | ICD-10-CM | POA: Diagnosis present

## 2018-01-04 DIAGNOSIS — Z79899 Other long term (current) drug therapy: Secondary | ICD-10-CM

## 2018-01-04 DIAGNOSIS — Z7951 Long term (current) use of inhaled steroids: Secondary | ICD-10-CM | POA: Diagnosis not present

## 2018-01-04 DIAGNOSIS — F410 Panic disorder [episodic paroxysmal anxiety] without agoraphobia: Secondary | ICD-10-CM | POA: Diagnosis present

## 2018-01-04 DIAGNOSIS — E785 Hyperlipidemia, unspecified: Secondary | ICD-10-CM | POA: Diagnosis present

## 2018-01-04 DIAGNOSIS — G92 Toxic encephalopathy: Secondary | ICD-10-CM | POA: Diagnosis present

## 2018-01-04 DIAGNOSIS — T424X1A Poisoning by benzodiazepines, accidental (unintentional), initial encounter: Secondary | ICD-10-CM | POA: Diagnosis not present

## 2018-01-04 DIAGNOSIS — Z4659 Encounter for fitting and adjustment of other gastrointestinal appliance and device: Secondary | ICD-10-CM

## 2018-01-04 DIAGNOSIS — Z4682 Encounter for fitting and adjustment of non-vascular catheter: Secondary | ICD-10-CM | POA: Diagnosis not present

## 2018-01-04 DIAGNOSIS — Z9911 Dependence on respirator [ventilator] status: Secondary | ICD-10-CM

## 2018-01-04 DIAGNOSIS — R410 Disorientation, unspecified: Secondary | ICD-10-CM | POA: Diagnosis not present

## 2018-01-04 LAB — TROPONIN I
Troponin I: <0.03 ng/mL (ref <0.03)
Troponin I: <0.03 ng/mL (ref <0.03)
Troponin I: <0.03 ng/mL (ref <0.03)
Troponin I: <0.03 ng/mL (ref <0.03)

## 2018-01-04 LAB — URINALYSIS, COMPLETE (UACMP) WITH MICROSCOPIC
Bacteria, UA: NONE SEEN
Bilirubin Urine: NEGATIVE
Glucose, UA: NEGATIVE mg/dL
Ketones, ur: NEGATIVE mg/dL
Leukocytes, UA: NEGATIVE
Nitrite: NEGATIVE
Protein, ur: NEGATIVE mg/dL
Specific Gravity, Urine: 1.017 (ref 1.005–1.030)
Squamous Epithelial / LPF: NONE SEEN
pH: 5 (ref 5.0–8.0)

## 2018-01-04 LAB — CBC WITH DIFFERENTIAL/PLATELET
Basophils Absolute: 0 10*3/uL (ref 0–0.1)
Basophils Relative: 0 %
Eosinophils Absolute: 0.1 10*3/uL (ref 0–0.7)
Eosinophils Relative: 1 %
HCT: 41.1 % (ref 35.0–47.0)
Hemoglobin: 13.4 g/dL (ref 12.0–16.0)
Lymphocytes Relative: 17 %
Lymphs Abs: 2.3 10*3/uL (ref 1.0–3.6)
MCH: 29.9 pg (ref 26.0–34.0)
MCHC: 32.6 g/dL (ref 32.0–36.0)
MCV: 91.5 fL (ref 80.0–100.0)
Monocytes Absolute: 0.7 10*3/uL (ref 0.2–0.9)
Monocytes Relative: 5 %
Neutro Abs: 10.2 10*3/uL — ABNORMAL HIGH (ref 1.4–6.5)
Neutrophils Relative %: 77 %
Platelets: 232 10*3/uL (ref 150–440)
RBC: 4.49 MIL/uL (ref 3.80–5.20)
RDW: 15.3 % — ABNORMAL HIGH (ref 11.5–14.5)
WBC: 13.3 10*3/uL — ABNORMAL HIGH (ref 3.6–11.0)

## 2018-01-04 LAB — BLOOD GAS, ARTERIAL
Acid-base deficit: 3.8 mmol/L — ABNORMAL HIGH (ref 0.0–2.0)
Acid-base deficit: 2.4 mmol/L — ABNORMAL HIGH (ref 0.0–2.0)
Bicarbonate: 23.4 mmol/L (ref 20.0–28.0)
Bicarbonate: 24.2 mmol/L (ref 20.0–28.0)
FIO2: 0.4
FIO2: 0.4
MECHVT: 450 mL
MECHVT: 450 mL
Mechanical Rate: 20
O2 Saturation: 91.5 %
O2 Saturation: 98.1 %
PEEP: 5 cmH2O
PEEP: 5 cmH2O
Patient temperature: 37
Patient temperature: 37
RATE: 20 resp/min
pCO2 arterial: 51 mmHg — ABNORMAL HIGH (ref 32.0–48.0)
pCO2 arterial: 48 mmHg (ref 32.0–48.0)
pH, Arterial: 7.27 — ABNORMAL LOW (ref 7.350–7.450)
pH, Arterial: 7.31 — ABNORMAL LOW (ref 7.350–7.450)
pO2, Arterial: 71 mmHg — ABNORMAL LOW (ref 83.0–108.0)
pO2, Arterial: 115 mmHg — ABNORMAL HIGH (ref 83.0–108.0)

## 2018-01-04 LAB — COMPREHENSIVE METABOLIC PANEL
ALT: 35 U/L (ref 14–54)
AST: 32 U/L (ref 15–41)
Albumin: 4.1 g/dL (ref 3.5–5.0)
Alkaline Phosphatase: 95 U/L (ref 38–126)
Anion gap: 13 (ref 5–15)
BUN: 18 mg/dL (ref 6–20)
CO2: 21 mmol/L — ABNORMAL LOW (ref 22–32)
Calcium: 9.2 mg/dL (ref 8.9–10.3)
Chloride: 108 mmol/L (ref 101–111)
Creatinine, Ser: 0.86 mg/dL (ref 0.44–1.00)
GFR calc Af Amer: >60 mL/min (ref >60)
GFR calc non Af Amer: >60 mL/min (ref >60)
Glucose, Bld: 144 mg/dL — ABNORMAL HIGH (ref 65–99)
Potassium: 4.5 mmol/L (ref 3.5–5.1)
Sodium: 142 mmol/L (ref 135–145)
Total Bilirubin: 0.4 mg/dL (ref 0.3–1.2)
Total Protein: 7.9 g/dL (ref 6.5–8.1)

## 2018-01-04 LAB — SALICYLATE LEVEL: Salicylate Lvl: <7 mg/dL (ref 2.8–30.0)

## 2018-01-04 LAB — BLOOD GAS, VENOUS
Acid-base deficit: 3.6 mmol/L — ABNORMAL HIGH (ref 0.0–2.0)
Bicarbonate: 23.1 mmol/L (ref 20.0–28.0)
O2 Saturation: 71.4 %
Patient temperature: 37
pCO2, Ven: 47 mmHg (ref 44.0–60.0)
pH, Ven: 7.3 (ref 7.250–7.430)
pO2, Ven: 42 mmHg (ref 32.0–45.0)

## 2018-01-04 LAB — CBC
HCT: 36.2 % (ref 35.0–47.0)
Hemoglobin: 12 g/dL (ref 12.0–16.0)
MCH: 30.4 pg (ref 26.0–34.0)
MCHC: 33.2 g/dL (ref 32.0–36.0)
MCV: 91.7 fL (ref 80.0–100.0)
Platelets: 203 10*3/uL (ref 150–440)
RBC: 3.95 MIL/uL (ref 3.80–5.20)
RDW: 15.3 % — ABNORMAL HIGH (ref 11.5–14.5)
WBC: 10.5 10*3/uL (ref 3.6–11.0)

## 2018-01-04 LAB — PROCALCITONIN
Procalcitonin: <0.1 ng/mL
Procalcitonin: <0.1 ng/mL

## 2018-01-04 LAB — PROTIME-INR
INR: 0.89
INR: 0.96
Prothrombin Time: 12 seconds (ref 11.4–15.2)
Prothrombin Time: 12.7 seconds (ref 11.4–15.2)

## 2018-01-04 LAB — LACTIC ACID, PLASMA
Lactic Acid, Venous: 3.3 mmol/L (ref 0.5–1.9)
Lactic Acid, Venous: 1.6 mmol/L (ref 0.5–1.9)

## 2018-01-04 LAB — URINE DRUG SCREEN, QUALITATIVE (ARMC ONLY)
Amphetamines, Ur Screen: NOT DETECTED
Barbiturates, Ur Screen: NOT DETECTED
Benzodiazepine, Ur Scrn: POSITIVE — AB
Cannabinoid 50 Ng, Ur ~~LOC~~: NOT DETECTED
Cocaine Metabolite,Ur ~~LOC~~: NOT DETECTED
MDMA (Ecstasy)Ur Screen: NOT DETECTED
Methadone Scn, Ur: NOT DETECTED
Opiate, Ur Screen: NOT DETECTED
Phencyclidine (PCP) Ur S: NOT DETECTED
Tricyclic, Ur Screen: POSITIVE — AB

## 2018-01-04 LAB — APTT: aPTT: 24 seconds (ref 24–36)

## 2018-01-04 LAB — AMMONIA: Ammonia: 27 umol/L (ref 9–35)

## 2018-01-04 LAB — ETHANOL: Alcohol, Ethyl (B): <10 mg/dL (ref <10)

## 2018-01-04 LAB — TRIGLYCERIDES: Triglycerides: 71 mg/dL (ref <150)

## 2018-01-04 LAB — CREATININE, SERUM
Creatinine, Ser: 0.82 mg/dL (ref 0.44–1.00)
GFR calc Af Amer: >60 mL/min (ref >60)
GFR calc non Af Amer: >60 mL/min (ref >60)

## 2018-01-04 LAB — ACETAMINOPHEN LEVEL: Acetaminophen (Tylenol), Serum: <10 ug/mL — ABNORMAL LOW (ref 10–30)

## 2018-01-04 LAB — GLUCOSE, CAPILLARY: Glucose-Capillary: 136 mg/dL — ABNORMAL HIGH (ref 65–99)

## 2018-01-04 LAB — MRSA PCR SCREENING: MRSA by PCR: NEGATIVE

## 2018-01-04 LAB — CK: Total CK: 414 U/L — ABNORMAL HIGH (ref 38–234)

## 2018-01-04 MED ORDER — PROPOFOL 1000 MG/100ML IV EMUL
5.0000 ug/kg/min | Freq: Once | INTRAVENOUS | Status: AC
  Administered 2018-01-04: 30 ug/kg/min via INTRAVENOUS

## 2018-01-04 MED ORDER — KETAMINE HCL 10 MG/ML IJ SOLN
100.0000 mg | Freq: Once | INTRAMUSCULAR | Status: AC
  Administered 2018-01-04: 100 mg via INTRAVENOUS

## 2018-01-04 MED ORDER — VANCOMYCIN HCL IN DEXTROSE 1-5 GM/200ML-% IV SOLN: 1000.0000 mg | Freq: Once | INTRAVENOUS | Status: DC

## 2018-01-04 MED ORDER — PROPOFOL 10 MG/ML IV BOLUS
50.0000 mg | Freq: Once | INTRAVENOUS | Status: AC
  Administered 2018-01-04: 50 mg via INTRAVENOUS

## 2018-01-04 MED ORDER — ACETAMINOPHEN 325 MG PO TABS
650.0000 mg | ORAL_TABLET | ORAL | Status: DC | PRN
  Administered 2018-01-06: 650 mg via NASOGASTRIC
  Filled 2018-01-04: qty 2

## 2018-01-04 MED ORDER — HEPARIN SODIUM (PORCINE) 5000 UNIT/ML IJ SOLN
5000.0000 [IU] | Freq: Three times a day (TID) | INTRAMUSCULAR | Status: DC
  Administered 2018-01-04 – 2018-01-07 (×10): 5000 [IU] via SUBCUTANEOUS
  Filled 2018-01-04 (×10): qty 1

## 2018-01-04 MED ORDER — ORAL CARE MOUTH RINSE
15.0000 mL | OROMUCOSAL | Status: DC
  Administered 2018-01-04 – 2018-01-07 (×30): 15 mL via OROMUCOSAL

## 2018-01-04 MED ORDER — SODIUM CHLORIDE 0.9 % IV SOLN
2.0000 g | Freq: Three times a day (TID) | INTRAVENOUS | Status: DC
  Administered 2018-01-04: 2 g via INTRAVENOUS
  Filled 2018-01-04 (×2): qty 2

## 2018-01-04 MED ORDER — MOMETASONE FURO-FORMOTEROL FUM 200-5 MCG/ACT IN AERO
2.0000 | INHALATION_SPRAY | Freq: Two times a day (BID) | RESPIRATORY_TRACT | Status: DC
  Filled 2018-01-04: qty 8.8

## 2018-01-04 MED ORDER — VANCOMYCIN HCL IN DEXTROSE 1-5 GM/200ML-% IV SOLN
1000.0000 mg | Freq: Once | INTRAVENOUS | Status: AC
  Administered 2018-01-04: 1000 mg via INTRAVENOUS
  Filled 2018-01-04: qty 200

## 2018-01-04 MED ORDER — PANTOPRAZOLE SODIUM 40 MG IV SOLR
40.0000 mg | Freq: Every day | INTRAVENOUS | Status: DC
  Administered 2018-01-04 – 2018-01-05 (×2): 40 mg via INTRAVENOUS
  Filled 2018-01-04 (×2): qty 40

## 2018-01-04 MED ORDER — NOREPINEPHRINE 4 MG/250ML-% IV SOLN
0.0000 ug/min | INTRAVENOUS | Status: DC
  Administered 2018-01-04: 6 ug/min via INTRAVENOUS
  Administered 2018-01-05: 3 ug/min via INTRAVENOUS
  Administered 2018-01-05: 4 ug/min via INTRAVENOUS
  Administered 2018-01-07: 6 ug/min via INTRAVENOUS
  Filled 2018-01-04 (×5): qty 250

## 2018-01-04 MED ORDER — SODIUM CHLORIDE 0.9 % IV SOLN
INTRAVENOUS | Status: DC
  Administered 2018-01-04 – 2018-01-05 (×2): via INTRAVENOUS

## 2018-01-04 MED ORDER — SODIUM CHLORIDE 0.9 % IV BOLUS (SEPSIS)
1000.0000 mL | Freq: Once | INTRAVENOUS | Status: AC
  Administered 2018-01-04: 1000 mL via INTRAVENOUS

## 2018-01-04 MED ORDER — ASPIRIN 81 MG PO CHEW
81.0000 mg | CHEWABLE_TABLET | Freq: Every day | ORAL | Status: DC
  Administered 2018-01-04 – 2018-01-07 (×4): 81 mg via NASOGASTRIC
  Filled 2018-01-04 (×4): qty 1

## 2018-01-04 MED ORDER — SODIUM CHLORIDE 0.9 % IV BOLUS (SEPSIS)
2000.0000 mL | Freq: Once | INTRAVENOUS | Status: AC
  Administered 2018-01-04: 2000 mL via INTRAVENOUS

## 2018-01-04 MED ORDER — FENTANYL CITRATE (PF) 100 MCG/2ML IJ SOLN
100.0000 ug | Freq: Once | INTRAMUSCULAR | Status: AC
  Administered 2018-01-04: 100 ug via INTRAVENOUS

## 2018-01-04 MED ORDER — BUDESONIDE 0.25 MG/2ML IN SUSP
0.2500 mg | Freq: Two times a day (BID) | RESPIRATORY_TRACT | Status: DC
  Administered 2018-01-04 – 2018-01-06 (×5): 0.25 mg via RESPIRATORY_TRACT
  Filled 2018-01-04 (×5): qty 2

## 2018-01-04 MED ORDER — SODIUM CHLORIDE 0.9 % IV SOLN: 250.0000 mL | INTRAVENOUS | Status: DC | PRN

## 2018-01-04 MED ORDER — PIPERACILLIN-TAZOBACTAM 3.375 G IVPB: 3.3750 g | Freq: Three times a day (TID) | INTRAVENOUS | Status: DC

## 2018-01-04 MED ORDER — PROPOFOL 1000 MG/100ML IV EMUL
INTRAVENOUS | Status: AC
  Filled 2018-01-04: qty 100

## 2018-01-04 MED ORDER — CHLORHEXIDINE GLUCONATE 0.12% ORAL RINSE (MEDLINE KIT)
15.0000 mL | Freq: Two times a day (BID) | OROMUCOSAL | Status: DC
  Administered 2018-01-04 – 2018-01-09 (×11): 15 mL via OROMUCOSAL

## 2018-01-04 MED ORDER — ONDANSETRON HCL 4 MG/2ML IJ SOLN: 4.0000 mg | Freq: Four times a day (QID) | INTRAMUSCULAR | Status: DC | PRN

## 2018-01-04 MED ORDER — ISOSORBIDE DINITRATE 30 MG PO TABS
30.0000 mg | ORAL_TABLET | Freq: Every day | ORAL | Status: DC
  Filled 2018-01-04: qty 1

## 2018-01-04 MED ORDER — SODIUM CHLORIDE 0.9 % IV SOLN: 2.0000 g | Freq: Once | INTRAVENOUS | Status: DC

## 2018-01-04 MED ORDER — FENTANYL 2500MCG IN NS 250ML (10MCG/ML) PREMIX INFUSION
0.0000 ug/h | INTRAVENOUS | Status: DC
  Administered 2018-01-04: 175 ug/h via INTRAVENOUS
  Administered 2018-01-04: 100 ug/h via INTRAVENOUS
  Administered 2018-01-05: 200 ug/h via INTRAVENOUS
  Administered 2018-01-05 – 2018-01-06 (×3): 350 ug/h via INTRAVENOUS
  Administered 2018-01-06: 125 ug/h via INTRAVENOUS
  Filled 2018-01-04 (×8): qty 250

## 2018-01-04 MED ORDER — PROPOFOL 1000 MG/100ML IV EMUL
0.0000 ug/kg/min | INTRAVENOUS | Status: DC
  Administered 2018-01-04: 20 ug/kg/min via INTRAVENOUS
  Administered 2018-01-04: 30 ug/kg/min via INTRAVENOUS
  Administered 2018-01-05: 50 ug/kg/min via INTRAVENOUS
  Administered 2018-01-05: 30 ug/kg/min via INTRAVENOUS
  Administered 2018-01-05: 20 ug/kg/min via INTRAVENOUS
  Administered 2018-01-05 (×2): 50 ug/kg/min via INTRAVENOUS
  Administered 2018-01-06 (×3): 45 ug/kg/min via INTRAVENOUS
  Administered 2018-01-06 (×2): 50 ug/kg/min via INTRAVENOUS
  Administered 2018-01-07: 25 ug/kg/min via INTRAVENOUS
  Administered 2018-01-07: 45 ug/kg/min via INTRAVENOUS
  Filled 2018-01-04 (×4): qty 100
  Filled 2018-01-04: qty 200
  Filled 2018-01-04 (×9): qty 100

## 2018-01-04 MED ORDER — PIPERACILLIN-TAZOBACTAM 3.375 G IVPB 30 MIN: 3.3750 g | Freq: Once | INTRAVENOUS | Status: DC

## 2018-01-04 MED ORDER — ALBUTEROL SULFATE (2.5 MG/3ML) 0.083% IN NEBU
2.5000 mg | INHALATION_SOLUTION | RESPIRATORY_TRACT | Status: DC
  Administered 2018-01-04 – 2018-01-06 (×16): 2.5 mg via RESPIRATORY_TRACT
  Filled 2018-01-04 (×16): qty 3

## 2018-01-04 MED ORDER — ATORVASTATIN CALCIUM 20 MG PO TABS
20.0000 mg | ORAL_TABLET | Freq: Every day | ORAL | Status: DC
  Administered 2018-01-04 – 2018-01-06 (×3): 20 mg via NASOGASTRIC
  Filled 2018-01-04 (×3): qty 1

## 2018-01-04 MED ORDER — VANCOMYCIN HCL IN DEXTROSE 1-5 GM/200ML-% IV SOLN
1000.0000 mg | Freq: Two times a day (BID) | INTRAVENOUS | Status: DC
Start: 2018-01-04 — End: 2018-01-04
  Filled 2018-01-04: qty 200

## 2018-01-04 MED ORDER — SODIUM CHLORIDE 0.9 % IV SOLN
3.0000 g | Freq: Four times a day (QID) | INTRAVENOUS | Status: DC
  Administered 2018-01-04 – 2018-01-05 (×5): 3 g via INTRAVENOUS
  Filled 2018-01-04 (×8): qty 3

## 2018-01-04 MED ORDER — LORAZEPAM 2 MG/ML IJ SOLN
2.0000 mg | Freq: Once | INTRAMUSCULAR | Status: DC
Start: 2018-01-04 — End: 2018-01-04

## 2018-01-04 MED ORDER — ACETAMINOPHEN 325 MG PO TABS: 650.0000 mg | ORAL_TABLET | ORAL | Status: DC | PRN

## 2018-01-04 MED ORDER — SUCCINYLCHOLINE CHLORIDE 20 MG/ML IJ SOLN
160.0000 mg | Freq: Once | INTRAMUSCULAR | Status: AC
  Administered 2018-01-04: 160 mg via INTRAVENOUS

## 2018-01-04 MED ORDER — SODIUM CHLORIDE 0.9 % IV SOLN: 100.0000 ug/h | INTRAVENOUS | Status: DC

## 2018-01-04 MED ORDER — FENTANYL CITRATE (PF) 100 MCG/2ML IJ SOLN
200.0000 ug | Freq: Once | INTRAMUSCULAR | Status: AC
  Administered 2018-01-04: 200 ug via INTRAVENOUS

## 2018-01-04 NOTE — Progress Notes (Signed)
Patient's MRSA PCR negative, spoke to NP to d/c vanc.  NP notified and okay to d/c vanc.  Tobie Lords, PharmD, BCPS Clinical Pharmacist 01/04/2018

## 2018-01-04 NOTE — H&P (Signed)
[facility] NAME: Kim Buchanan    MR#:  [id number]  DATE OF BIRTH:  [birth year]  DATE OF ADMISSION:  01/04/2018  PRIMARY CARE PHYSICIAN: Mikey College, NP   REQUESTING/REFERRING PHYSICIAN:   CHIEF COMPLAINT:   Chief Complaint  Patient presents with  . Altered Mental Status    HISTORY OF PRESENT ILLNESS: Kim Buchanan  is a 67 y.o. female with a known history per below which also includes drug overdoses requiring intubation, was discharged last month for similar presentation for drug overdose, brought to the emergency room for acute altered mental status, patient was combative in route to the hospital, given 1 mg IM Versed, in the emergency room patient was obtunded, drooling, emergently intubated for airway protection, patient noted to be tachycardic with heart rate of 109, white count of 13,000 with lactic acid 3.3, code sepsis initiated, CT head negative, chest x-ray negative, patient evaluated in the emergency room, resting comfortably intubated on propofol drip, patient is now being admitted for acute hypoxic respiratory failure, acute probable aspiration pneumonia with associated sepsis.  PAST MEDICAL HISTORY:   Past Medical History:  Diagnosis Date  . Anxiety   . Arthritis    lower back  . Depression   . GERD (gastroesophageal reflux disease)   . Hyperlipidemia   . Leaky heart valve    sees Dr Humphrey Rolls  . Panic attack   . PTSD (post-traumatic stress disorder)   . Rectal fistula   . Sleep apnea   . Wears dentures    full upper    PAST SURGICAL HISTORY:  Past Surgical History:  Procedure Laterality Date  . ABDOMINAL HYSTERECTOMY  10/2015  . COLONOSCOPY  2014  . COLONOSCOPY WITH PROPOFOL N/A 10/01/2016   Procedure: COLONOSCOPY WITH PROPOFOL;  Surgeon: Lucilla Lame, MD;  Location: [facility];  Service: Endoscopy;  Laterality: N/A;  . CYSTOCELE REPAIR N/A 12/17/2016   Procedure: ANTERIOR REPAIR (CYSTOCELE);   Surgeon: Brayton Mars, MD;  Location: [facility];  Service: Gynecology;  Laterality: N/A;  . POLYPECTOMY  10/01/2016   Procedure: POLYPECTOMY;  Surgeon: Lucilla Lame, MD;  Location: [facility];  Service: Endoscopy;;  . RECTAL SURGERY  06/28/2015   Rectal prolapse, laparoscopic rectopexy the coldCharmont, MD; [facility]  . TUBAL LIGATION    . VAGINAL HYSTERECTOMY Bilateral 12/17/2016   Procedure: HYSTERECTOMY VAGINAL WITH BILATERAL SALPINGO OOPHERECTOMY;  Surgeon: Brayton Mars, MD;  Location: [facility];  Service: Gynecology;  Laterality: Bilateral;    SOCIAL HISTORY:  Social History   Tobacco Use  . Smoking status: Current Every Day Smoker    Packs/day: 0.25    Years: 45.00    Pack years: 11.25    Types: Cigarettes  . Smokeless tobacco: Never Used  . Tobacco comment: started at age 33  Substance Use Topics  . Alcohol use: No    Alcohol/week: 0.0 oz    FAMILY HISTORY:  Family History  Problem Relation Age of Onset  . Diabetes Mother   . Diabetes Sister   . Diabetes Brother   . Lymphoma Brother   . Heart disease Father   . Diabetes Sister   . Diabetes Brother   . Lymphoma Brother   . Heart disease Brother   . Healthy Sister   . Breast cancer Neg Hx   . Ovarian cancer Neg Hx   . Colon cancer Neg Hx     DRUG ALLERGIES: NKDA  REVIEW OF SYSTEMS: Unable to be  obtained given comatose state   MEDICATIONS AT HOME:  Prior to Admission medications   Medication Sig Start Date End Date Taking? Authorizing Provider  albuterol (PROVENTIL HFA;VENTOLIN HFA) 108 (90 Base) MCG/ACT inhaler Inhale 1-2 puffs into the lungs every 6 (six) hours as needed for wheezing or shortness of breath. 12/16/17   Laverle Hobby, MD  amitriptyline (ELAVIL) 25 MG tablet Take 1 tablet (25 mg total) by mouth at bedtime. 01/01/18   Mikey College, NP  aspirin 81 MG tablet Take 81 mg by mouth daily.     [provider]  atorvastatin (LIPITOR) 20 MG tablet Take 1 tablet (20  mg total) by mouth at bedtime. 01/01/18   Mikey College, NP  budesonide-formoterol [facility]) 160-4.5 MCG/ACT inhaler Inhale 2 puffs into the lungs 2 (two) times daily. Rinse mouth after use 12/16/17   Laverle Hobby, MD  clonazePAM (KLONOPIN) 0.5 MG tablet Take 1 tablet (0.5 mg total) by mouth 2 (two) times daily as needed for anxiety. 01/01/18   Mikey College, NP  guaiFENesin (MUCINEX) 600 MG 12 hr tablet Take 600 mg by mouth 2 (two) times daily.    [provider]  ibuprofen (ADVIL,MOTRIN) 800 MG tablet Take 1 tablet (800 mg total) by mouth 3 (three) times daily. 12/18/16   Defrancesco, Alanda Slim, MD  isosorbide dinitrate (ISORDIL) 30 MG tablet Take 1 tablet (30 mg total) by mouth daily. 01/01/18   Mikey College, NP  omeprazole (PRILOSEC) 40 MG capsule Take 1 capsule (40 mg total) by mouth daily. 11/06/17   Mikey College, NP  venlafaxine XR (EFFEXOR-XR) 150 MG 24 hr capsule Take 1 capsule (150 mg total) by mouth at bedtime. 11/06/17   Mikey College, NP  venlafaxine XR (EFFEXOR-XR) 75 MG 24 hr capsule Take 1 capsule (75 mg total) by mouth at bedtime. 11/06/17   Mikey College, NP      PHYSICAL EXAMINATION:   VITAL SIGNS: Blood pressure 108/77, pulse 99, temperature 98.7 F (37.1 C), temperature source Rectal, resp. rate 20, height 5\' 5"  (1.651 m), weight 80 kg (176 lb 5.9 oz), SpO2 97 %.  GENERAL:  67 y.o.-year-old patient lying in the bed with no acute distress.  EYES: Pupils equal, round, reactive to light and accommodation. No scleral icterus. Extraocular muscles intact.  HEENT: Head atraumatic, normocephalic. Oropharynx and nasopharynx clear.  NECK:  Supple, no jugular venous distention. No thyroid enlargement, no tenderness.  LUNGS: Normal breath sounds bilaterally, no wheezing, rales,rhonchi or crepitation. No use of accessory muscles of respiration.  CARDIOVASCULAR: S1, S2 normal. No murmurs, rubs, or gallops.  ABDOMEN: Soft,  nontender, nondistended. Bowel sounds present. No organomegaly or mass.  EXTREMITIES: No pedal edema, cyanosis, or clubbing.  NEUROLOGIC: PERRL, chemically induced comatose state PSYCHIATRIC: The patient is comatose   SKIN: No obvious rash, lesion, or ulcer.   LABORATORY PANEL:   CBC Recent Labs  Lab 01/04/18 0018  WBC 13.3*  HGB 13.4  HCT 41.1  PLT 232  MCV 91.5  MCH 29.9  MCHC 32.6  RDW 15.3*  LYMPHSABS 2.3  MONOABS 0.7  EOSABS 0.1  BASOSABS 0.0   ------------------------------------------------------------------------------------------------------------------  Chemistries  Recent Labs  Lab 01/01/18 0000 01/04/18 0018  NA 140 142  K 4.3 4.5  CL 107 108  CO2 26 21*  GLUCOSE 80 144*  BUN 24 18  CREATININE 0.82 0.86  CALCIUM 8.8 9.2  AST 21 32  ALT 38* 35  ALKPHOS  --  95  BILITOT 0.4  0.4   ------------------------------------------------------------------------------------------------------------------ estimated creatinine clearance is 69.1 mL/min (by C-G formula based on SCr of 0.86 mg/dL). ------------------------------------------------------------------------------------------------------------------ No results for input(s): TSH, T4TOTAL, T3FREE, THYROIDAB in the last 72 hours.  Invalid input(s): FREET3   Coagulation profile Recent Labs  Lab 01/04/18 0018  INR 0.89   ------------------------------------------------------------------------------------------------------------------- No results for input(s): DDIMER in the last 72 hours. -------------------------------------------------------------------------------------------------------------------  Cardiac Enzymes Recent Labs  Lab 01/04/18 0018  TROPONINI <0.03   ------------------------------------------------------------------------------------------------------------------ Invalid input(s):  POCBNP  ---------------------------------------------------------------------------------------------------------------  Urinalysis    Component Value Date/Time   COLORURINE YELLOW (A) 01/04/2018 0018   APPEARANCEUR CLEAR (A) 01/04/2018 0018   APPEARANCEUR Clear 03/22/2016 0924   LABSPEC 1.017 01/04/2018 0018   LABSPEC 1.018 01/28/2013 1516   PHURINE 5.0 01/04/2018 0018   GLUCOSEU NEGATIVE 01/04/2018 0018   GLUCOSEU 50 mg/dL 01/28/2013 1516   HGBUR SMALL (A) 01/04/2018 0018   BILIRUBINUR NEGATIVE 01/04/2018 0018   BILIRUBINUR neg 03/07/2017 1426   BILIRUBINUR Negative 03/22/2016 0924   BILIRUBINUR Negative 01/28/2013 1516   KETONESUR NEGATIVE 01/04/2018 0018   PROTEINUR NEGATIVE 01/04/2018 0018   UROBILINOGEN negative (A) 03/07/2017 1426   NITRITE NEGATIVE 01/04/2018 0018   LEUKOCYTESUR NEGATIVE 01/04/2018 0018   LEUKOCYTESUR 2+ (A) 03/22/2016 0924   LEUKOCYTESUR Trace 01/28/2013 1516     RADIOLOGY: Ct Head Wo Contrast  Result Date: 01/04/2018 CLINICAL DATA:  Acute onset of altered level of consciousness. EXAM: CT HEAD WITHOUT CONTRAST TECHNIQUE: Contiguous axial images were obtained from the base of the skull through the vertex without intravenous contrast. COMPARISON:  CT of the head performed 11/13/2017 FINDINGS: Brain: No evidence of acute infarction, hemorrhage, hydrocephalus, extra-axial collection or mass lesion/mass effect. Mild periventricular white matter change likely reflects small vessel ischemic microangiopathy. The posterior fossa, including the cerebellum, brainstem and fourth ventricle, is within normal limits. The third and lateral ventricles, and basal ganglia are unremarkable in appearance. The cerebral hemispheres are symmetric in appearance, with normal gray-white differentiation. No mass effect or midline shift is seen. Vascular: No hyperdense vessel or unexpected calcification. Skull: There is no evidence of fracture; visualized osseous structures are  unremarkable in appearance. Sinuses/Orbits: The orbits are within normal limits. The paranasal sinuses and mastoid air cells are well-aerated. Other: No significant soft tissue abnormalities are seen. IMPRESSION: 1. No acute intracranial pathology seen on CT. 2. Mild small vessel ischemic microangiopathy. Electronically Signed   By: Garald Balding M.D.   On: 01/04/2018 00:46   Dg Chest Port 1 View  Result Date: 01/04/2018 CLINICAL DATA:  Acute onset of altered mental status. Patient unresponsive. EXAM: PORTABLE CHEST 1 VIEW COMPARISON:  Chest radiograph performed 12/08/2017 FINDINGS: The lungs are well-aerated and clear. There is no evidence of focal opacification, pleural effusion or pneumothorax. The cardiomediastinal silhouette is within normal limits. No acute osseous abnormalities are seen. IMPRESSION: No acute cardiopulmonary process seen. Electronically Signed   By: Garald Balding M.D.   On: 01/04/2018 00:45    EKG: Orders placed or performed during the hospital encounter of 01/04/18  . ED EKG  . ED EKG  . EKG 12-Lead  . EKG 12-Lead    IMPRESSION AND PLAN: 67 yo female admitted with acute encephalopathy likely secondary to accidental vs. unintentional drug overdose requiring mechanical intubation for airway protection  1 Acute respiratory failure with hypoxia  Most likely secondary to recurrent probable drug overdose-discharge last month for similar presentation  Admit to ICU, intensivist notified, continue mechanical ventilation with weaning as tolerated, blood gas in the morning, aspiration  precautions, rule out acute coronary syndrome with cardiac enzymes x3 sets, and continue close medical monitoring  2 acute possible sepsis  ?  Secondary to aspiration pneumonia Continue sepsis protocol, empiric vancomycin/cefepime, follow-up on cultures  3 acute toxic metabolic encephalopathy Most likely secondary to acute recurrent drug overdose and acute hypoxic respiratory failure Hold  psychotropic meds, mechanical ventilation with weaning as tolerated, check ammonia level, RPR, neuro checks per routine, IV fluids for rehydration  4 chronic GERD PPI daily  5 chronic depression Past medical history noted also for PTSD/panic attacks consider psychiatry Consultation once medically stable    All the records are reviewed and case discussed with ED provider. Management plans discussed with the patient, family and they are in agreement.  CODE STATUS:Full Code Status History    Date Active Date Inactive Code Status Order ID Comments User Context   11/14/2017 01:37 12/08/2017 20:21 Full Code [id number]  Lance Coon, MD Inpatient   12/17/2016 11:46 12/18/2016 18:05 Full Code [id number]  Defrancesco, Alanda Slim, MD Inpatient       TOTAL TIME TAKING CARE OF THIS PATIENT: 45 minutes.    Avel Peace Salary M.D on 01/04/2018   Between 7am to 6pm - Pager - [phone]  After 6pm go to [email or web address] - password [email or web address] [place] [facility]  Office  [phone]  CC: Primary care physician; Mikey College, NP   Note: This dictation was prepared with Dragon dictation along with smaller phrase technology. Any transcriptional errors that result from this process are unintentional.

## 2018-01-04 NOTE — Progress Notes (Addendum)
Pharmacy Antibiotic Note  Kim Buchanan is a 67 y.o. female admitted on 01/04/2018 with sepsis.  Pharmacy has been consulted for vanc/cefepime dosing, then switched to Unasyn dosing:  Plan: Will start Unasyn 3g IV q6h -- cefepime cancelled and vanc d/c'd due to negative MRSA PCR  Patient received vanc 1g and cefepime 2g IV x 1 in ED Will continue vanc 1g IV q12h w/ 6 hour stack Will draw vanc trough 03/17 @ 1900 prior to 4th dose. Will continue cefepime 2g IV q8h  Ke 0.0617 T1/2 12 hrs Goal trough 15 - 20 mcg/mL  Height: 5\' 5"  (165.1 cm) Weight: 176 lb 5.9 oz (80 kg) IBW/kg (Calculated) : 57  Temp (24hrs), Avg:98.7 F (37.1 C), Min:98.7 F (37.1 C), Max:98.7 F (37.1 C)  Recent Labs  Lab 01/01/18 0000 01/04/18 0018  WBC  --  13.3*  CREATININE 0.82 0.86  LATICACIDVEN  --  3.3*    Estimated Creatinine Clearance: 69.1 mL/min (by C-G formula based on SCr of 0.86 mg/dL).    No Known Allergies  Thank you for allowing pharmacy to be a part of this patient's care.  Tobie Lords, PharmD, BCPS Clinical Pharmacist 01/04/2018

## 2018-01-04 NOTE — ED Triage Notes (Signed)
Patient presents to Emergency Department via EMS with complaints of AMS. Per EMS via family LKW [id number].  EMS gave 1 mg versed d/t pt combative.  Pt is not alert no withdrawal from pain appears writhing and moaning

## 2018-01-04 NOTE — Consult Note (Signed)
Name: Kim Buchanan MRN: [id number] DOB: [birth year]    ADMISSION DATE:  01/04/2018 CONSULTATION DATE: 01/04/2018  REFERRING MD : Dr. Jerelyn Charles   CHIEF COMPLAINT: Altered Mental Status   BRIEF PATIENT DESCRIPTION:  67 yo female with a known polysubstance abuse hx admitted with acute respiratory failure secondary to aspiraton and acute encephalopathy likely secondary to accidental vs. unintentional drug overdose requiring mechanical intubation  SIGNIFICANT EVENTS  03/16-Pt admitted to ICU   STUDIES:  CT Head 03/16>>No acute intracranial pathology seen on CT. Mild small vessel ischemic microangiopathy  HISTORY OF PRESENT ILLNESS:   This is a 67 yo female with a PMH of OSA, Rectal Fistula, PTSD, Leaky Heart Valve, Hyperlipidemia, GERD, Depression, Accidental Drug Overdose requiring Mechanical Intubation, Arthritis, and Anxiety.  She presented to [facility] ER via EMS on 03/16 with altered mental status.  Per ER notes according to pts family she was in her usual state of health 1 hour prior to EMS arrival then she became increasingly confused.  Upon EMS arrival the pt was slightly hypoxic, thrashing around, and unable to provide meaningful history.  In route to the ER the pt received 1 mg Versed IM due to combativeness.  In the ER the pt was not alert and unable to withdrawal from painful stimulation, therefore she was intubated for airway protection.  Lab results revealed wbc 13.3 and lactic acid 3.3 concerning for possible sepsis, therefore sepsis protocol initiated.  CT Head negative and urine drug screen positive for benzodiazepine and tricyclic.  She was subsequently admitted to ICU by hospitalist team for further workup and treatment PCCM consulted for vent management.   PAST MEDICAL HISTORY :   has a past medical history of Anxiety, Arthritis, Depression, GERD (gastroesophageal reflux disease), Hyperlipidemia, Leaky heart valve, Panic attack, PTSD (post-traumatic stress disorder), Rectal  fistula, Sleep apnea, and Wears dentures.  has a past surgical history that includes Rectal surgery (06/28/2015); Colonoscopy (2014); Colonoscopy with propofol (N/A, 10/01/2016); polypectomy (10/01/2016); Tubal ligation; Vaginal hysterectomy (Bilateral, 12/17/2016); Cystocele repair (N/A, 12/17/2016); and Abdominal hysterectomy (10/2015). Prior to Admission medications   Medication Sig Start Date End Date Taking? Authorizing Provider  albuterol (PROVENTIL HFA;VENTOLIN HFA) 108 (90 Base) MCG/ACT inhaler Inhale 1-2 puffs into the lungs every 6 (six) hours as needed for wheezing or shortness of breath. 12/16/17   Laverle Hobby, MD  amitriptyline (ELAVIL) 25 MG tablet Take 1 tablet (25 mg total) by mouth at bedtime. 01/01/18   Mikey College, NP  aspirin 81 MG tablet Take 81 mg by mouth daily.     [provider]  atorvastatin (LIPITOR) 20 MG tablet Take 1 tablet (20 mg total) by mouth at bedtime. 01/01/18   Mikey College, NP  budesonide-formoterol [facility]) 160-4.5 MCG/ACT inhaler Inhale 2 puffs into the lungs 2 (two) times daily. Rinse mouth after use 12/16/17   Laverle Hobby, MD  clonazePAM (KLONOPIN) 0.5 MG tablet Take 1 tablet (0.5 mg total) by mouth 2 (two) times daily as needed for anxiety. 01/01/18   Mikey College, NP  guaiFENesin (MUCINEX) 600 MG 12 hr tablet Take 600 mg by mouth 2 (two) times daily.    [provider]  ibuprofen (ADVIL,MOTRIN) 800 MG tablet Take 1 tablet (800 mg total) by mouth 3 (three) times daily. 12/18/16   Defrancesco, Alanda Slim, MD  isosorbide dinitrate (ISORDIL) 30 MG tablet Take 1 tablet (30 mg total) by mouth daily. 01/01/18   Mikey College, NP  omeprazole (PRILOSEC) 40 MG capsule Take 1 capsule (  40 mg total) by mouth daily. 11/06/17   Mikey College, NP  venlafaxine XR (EFFEXOR-XR) 150 MG 24 hr capsule Take 1 capsule (150 mg total) by mouth at bedtime. 11/06/17   Mikey College, NP  venlafaxine XR  (EFFEXOR-XR) 75 MG 24 hr capsule Take 1 capsule (75 mg total) by mouth at bedtime. 11/06/17   Mikey College, NP   No Known Allergies  FAMILY HISTORY:  family history includes Diabetes in her brother, brother, mother, sister, and sister; Healthy in her sister; Heart disease in her brother and father; Lymphoma in her brother and brother. SOCIAL HISTORY:  reports that she has been smoking cigarettes.  She has a 11.25 pack-year smoking history. she has never used smokeless tobacco. She reports that she does not drink alcohol or use drugs.  REVIEW OF SYSTEMS:   Unable to assess pt intubated   SUBJECTIVE:  Unable to assess pt intubated   VITAL SIGNS: Temp:  [98.7 F (37.1 C)] 98.7 F (37.1 C) (03/16 0019) Pulse Rate:  [98-109] 99 (03/16 0130) Resp:  [16-21] 20 (03/16 0130) BP: (108-148)/(71-89) 108/77 (03/16 0130) SpO2:  [91 %-99 %] 97 % (03/16 0130) FiO2 (%):  [40 %] 40 % (03/16 0110) Weight:  [80 kg (176 lb 5.9 oz)] 80 kg (176 lb 5.9 oz) (03/16 0021)  PHYSICAL EXAMINATION: General: well developed, well nourished female, NAD on mechanical ventilation  Neuro: sedated, not following commands, PERRL  HEENT: supple, no JVD  Cardiovascular: nsr, s1s2, no M/R/G Lungs: rhonchi throughout, even, non labored  Abdomen: +BS x4, soft, obese, non tender, non distended  Musculoskeletal: normal bulk and tone, no edema  Skin: intact no rashes or lesions   Recent Labs  Lab 01/01/18 0000 01/04/18 0018  NA 140 142  K 4.3 4.5  CL 107 108  CO2 26 21*  BUN 24 18  CREATININE 0.82 0.86  GLUCOSE 80 144*   Recent Labs  Lab 01/04/18 0018  HGB 13.4  HCT 41.1  WBC 13.3*  PLT 232   Ct Head Wo Contrast  Result Date: 01/04/2018 CLINICAL DATA:  Acute onset of altered level of consciousness. EXAM: CT HEAD WITHOUT CONTRAST TECHNIQUE: Contiguous axial images were obtained from the base of the skull through the vertex without intravenous contrast. COMPARISON:  CT of the head performed  11/13/2017 FINDINGS: Brain: No evidence of acute infarction, hemorrhage, hydrocephalus, extra-axial collection or mass lesion/mass effect. Mild periventricular white matter change likely reflects small vessel ischemic microangiopathy. The posterior fossa, including the cerebellum, brainstem and fourth ventricle, is within normal limits. The third and lateral ventricles, and basal ganglia are unremarkable in appearance. The cerebral hemispheres are symmetric in appearance, with normal gray-white differentiation. No mass effect or midline shift is seen. Vascular: No hyperdense vessel or unexpected calcification. Skull: There is no evidence of fracture; visualized osseous structures are unremarkable in appearance. Sinuses/Orbits: The orbits are within normal limits. The paranasal sinuses and mastoid air cells are well-aerated. Other: No significant soft tissue abnormalities are seen. IMPRESSION: 1. No acute intracranial pathology seen on CT. 2. Mild small vessel ischemic microangiopathy. Electronically Signed   By: Garald Balding M.D.   On: 01/04/2018 00:46   Dg Chest Port 1 View  Result Date: 01/04/2018 CLINICAL DATA:  Acute onset of altered mental status. Patient unresponsive. EXAM: PORTABLE CHEST 1 VIEW COMPARISON:  Chest radiograph performed 12/10/2017 FINDINGS: The lungs are well-aerated and clear. There is no evidence of focal opacification, pleural effusion or pneumothorax. The cardiomediastinal silhouette is within  normal limits. No acute osseous abnormalities are seen. IMPRESSION: No acute cardiopulmonary process seen. Electronically Signed   By: Garald Balding M.D.   On: 01/04/2018 00:45    ASSESSMENT / PLAN: Acute respiratory failure likely secondary to aspiration  Acute encephalopathy likely secondary to accidental vs. unintentional drug overdose  Hx: Anxiety, Depression, Accidental Overdose requiring mechanical intubation, OSA, and GERD P: Full vent support-vent settings reviewed and  established  SBT once all parameters met  VAP bundle implemented  Prn bronchodilator therapy  Continuous telemetry monitoring  Continue outpatient cardiac medications  Trend troponin's  SUP px: protonix  Keep NPO for now if pt remains intubated in next 24 hrs will start tube feeds VTE px: subq heparin  Trend CBC Monitor for s/sx of bleeding and transfuse for hgb <7 Trend WBC and monitor fever curve Trend PCT and lactic acid Follow cultures  Will change abx to unasyn for aspiration coverage  Trend BMP Replace electrolytes as indicated  Monitor UOP  Maintain RASS goal 0 to -1 Propofol gtt to maintain RASS goal  Prn fentanyl for pain management  WUA daily Once extubated will place sitter order and consult psychiatry   Marda Stalker, [place] Pager [phone] (please enter 7 digits) PCCM Consult Pager [phone] (please enter 7 digits)

## 2018-01-04 NOTE — ED Notes (Signed)
Pt appears to be gagging or discomforted by the ETT, bolus of sedation ordered

## 2018-01-04 NOTE — ED Provider Notes (Signed)
[facility] Emergency Department Provider Note  ____________________________________________   First MD Initiated Contact with Patient 01/04/18 0017     (approximate)  I have reviewed the triage vital signs and the nursing notes.   HISTORY  Chief Complaint Altered Mental Status  Level 5 exemption history limited by the patient's clinical condition  HPI Kim Buchanan is a 67 y.o. female is brought to the emergency department by EMS for altered mental status.  According to EMS apparently the patient was in her usual state of health about an hour prior to arrival and then family noted that she became increasingly confused.  When EMS found the patient she was slightly hypoxic thrashing around and unable to provide any meaningful history.  She had a blood sugar of 150 in route and was hypoxic to 91%.  Given 1 mg of Versed intramuscularly in route.  Past Medical History:  Diagnosis Date  . Anxiety   . Arthritis    lower back  . Depression   . GERD (gastroesophageal reflux disease)   . Hyperlipidemia   . Leaky heart valve    sees Dr Humphrey Rolls  . Panic attack   . PTSD (post-traumatic stress disorder)   . Rectal fistula   . Sleep apnea   . Wears dentures    full upper    Patient Active Problem List   Diagnosis Date Noted  . Sedative or hypnotic overdose 11/20/2017  . Obtunded 11/13/2017  . Acute respiratory failure with hypercapnia ([place]) 11/13/2017  . Psychophysiological insomnia 11/06/2017  . Generalized anxiety disorder 10/20/2017  . Hyperlipidemia 07/19/2017  . S/P vaginal hysterectomy 12/17/2016  . Vaginal atrophy 10/31/2016  . Hx of colonic polyps   . Benign neoplasm of transverse colon   . Benign neoplasm of descending colon   . Polyp of sigmoid colon   . Difficulty urinating 08/31/2016  . DDD (degenerative disc disease), lumbar 02/21/2016  . GERD (gastroesophageal reflux disease) 02/14/2016  . Low back pain 02/14/2016  . Prolapsed external  hemorrhoids 06/06/2015  . Anxiety and depression 06/06/2015  . Depression, major, recurrent ([place]) 06/06/2015  . Panic attack 06/06/2015  . Compulsive tobacco user syndrome 06/06/2015    Past Surgical History:  Procedure Laterality Date  . ABDOMINAL HYSTERECTOMY  10/2015  . COLONOSCOPY  2014  . COLONOSCOPY WITH PROPOFOL N/A 10/01/2016   Procedure: COLONOSCOPY WITH PROPOFOL;  Surgeon: Lucilla Lame, MD;  Location: [place];  Service: Endoscopy;  Laterality: N/A;  . CYSTOCELE REPAIR N/A 12/17/2016   Procedure: ANTERIOR REPAIR (CYSTOCELE);  Surgeon: Brayton Mars, MD;  Location: [facility];  Service: Gynecology;  Laterality: N/A;  . POLYPECTOMY  10/01/2016   Procedure: POLYPECTOMY;  Surgeon: Lucilla Lame, MD;  Location: [place];  Service: Endoscopy;;  . RECTAL SURGERY  06/28/2015   Rectal prolapse, laparoscopic rectopexy the coldCharmont, MD; [facility]  . TUBAL LIGATION    . VAGINAL HYSTERECTOMY Bilateral 12/17/2016   Procedure: HYSTERECTOMY VAGINAL WITH BILATERAL SALPINGO OOPHERECTOMY;  Surgeon: Brayton Mars, MD;  Location: [facility];  Service: Gynecology;  Laterality: Bilateral;    Prior to Admission medications   Medication Sig Start Date End Date Taking? Authorizing Provider  albuterol (PROVENTIL HFA;VENTOLIN HFA) 108 (90 Base) MCG/ACT inhaler Inhale 1-2 puffs into the lungs every 6 (six) hours as needed for wheezing or shortness of breath. 12/16/17   Laverle Hobby, MD  amitriptyline (ELAVIL) 25 MG tablet Take 1 tablet (25 mg total) by mouth at bedtime. 01/01/18   Cassell Smiles  Renee, NP  aspirin 81 MG tablet Take 81 mg by mouth daily.     [provider]  atorvastatin (LIPITOR) 20 MG tablet Take 1 tablet (20 mg total) by mouth at bedtime. 01/01/18   Mikey College, NP  budesonide-formoterol [facility]) 160-4.5 MCG/ACT inhaler Inhale 2 puffs into the lungs 2 (two) times daily. Rinse mouth after use 12/16/17   Laverle Hobby, MD    clonazePAM (KLONOPIN) 0.5 MG tablet Take 1 tablet (0.5 mg total) by mouth 2 (two) times daily as needed for anxiety. 01/01/18   Mikey College, NP  guaiFENesin (MUCINEX) 600 MG 12 hr tablet Take 600 mg by mouth 2 (two) times daily.    [provider]  ibuprofen (ADVIL,MOTRIN) 800 MG tablet Take 1 tablet (800 mg total) by mouth 3 (three) times daily. 12/18/16   Defrancesco, Alanda Slim, MD  isosorbide dinitrate (ISORDIL) 30 MG tablet Take 1 tablet (30 mg total) by mouth daily. 01/01/18   Mikey College, NP  omeprazole (PRILOSEC) 40 MG capsule Take 1 capsule (40 mg total) by mouth daily. 11/06/17   Mikey College, NP  venlafaxine XR (EFFEXOR-XR) 150 MG 24 hr capsule Take 1 capsule (150 mg total) by mouth at bedtime. 11/06/17   Mikey College, NP  venlafaxine XR (EFFEXOR-XR) 75 MG 24 hr capsule Take 1 capsule (75 mg total) by mouth at bedtime. 11/06/17   Mikey College, NP    Allergies Patient has no known allergies.  Family History  Problem Relation Age of Onset  . Diabetes Mother   . Diabetes Sister   . Diabetes Brother   . Lymphoma Brother   . Heart disease Father   . Diabetes Sister   . Diabetes Brother   . Lymphoma Brother   . Heart disease Brother   . Healthy Sister   . Breast cancer Neg Hx   . Ovarian cancer Neg Hx   . Colon cancer Neg Hx     Social History Social History   Tobacco Use  . Smoking status: Current Every Day Smoker    Packs/day: 0.25    Years: 45.00    Pack years: 11.25    Types: Cigarettes  . Smokeless tobacco: Never Used  . Tobacco comment: started at age 15  Substance Use Topics  . Alcohol use: No    Alcohol/week: 0.0 oz  . Drug use: No    Review of Systems Level 5 exemption history limited by the patient's clinical condition  ____________________________________________   PHYSICAL EXAM:  VITAL SIGNS: ED Triage Vitals [01/04/18 0015]  Enc Vitals Group     BP      Pulse      Resp      Temp      Temp  src      SpO2 91 %     Weight      Height      Head Circumference      Peak Flow      Pain Score      Pain Loc      Pain Edu?      Excl. in [place]?     Constitutional: Agitated, diaphoretic, flailing all 4 extremities, sonorous respirations, not speaking Eyes: PERRL EOMI. pupils are mid range and brisk  Head: Atraumatic. Nose: No congestion/rhinnorhea. Mouth/Throat: No trismus Neck: No stridor.   Cardiovascular: Tachycardic rate, regular rhythm. Grossly normal heart sounds.  Good peripheral circulation. Respiratory: Increased respiratory effort with coarse breath sounds in all fields Gastrointestinal:  Soft nontender Musculoskeletal: No lower extremity edema   Neurologic: Moves all 4 extremities although not localizing to pain Skin: Diaphoretic Psychiatric: Encephalopathic    ____________________________________________   DIFFERENTIAL includes but not limited to  Intracerebral hemorrhage, stroke, alcohol intoxication, drug overdose, metabolic derangement, encephalitis ____________________________________________   LABS (all labs ordered are listed, but only abnormal results are displayed)  Labs Reviewed  ACETAMINOPHEN LEVEL - Abnormal; Notable for the following components:      Result Value   Acetaminophen (Tylenol), Serum <10 (*)    All other components within normal limits  COMPREHENSIVE METABOLIC PANEL - Abnormal; Notable for the following components:   CO2 21 (*)    Glucose, Bld 144 (*)    All other components within normal limits  LACTIC ACID, PLASMA - Abnormal; Notable for the following components:   Lactic Acid, Venous 3.3 (*)    All other components within normal limits  CBC WITH DIFFERENTIAL/PLATELET - Abnormal; Notable for the following components:   WBC 13.3 (*)    RDW 15.3 (*)    Neutro Abs 10.2 (*)    All other components within normal limits  URINALYSIS, COMPLETE (UACMP) WITH MICROSCOPIC - Abnormal; Notable for the following components:   Color, Urine  YELLOW (*)    APPearance CLEAR (*)    Hgb urine dipstick SMALL (*)    All other components within normal limits  URINE DRUG SCREEN, QUALITATIVE ([facility] ONLY) - Abnormal; Notable for the following components:   Tricyclic, Ur Screen POSITIVE (*)    Benzodiazepine, Ur Scrn POSITIVE (*)    All other components within normal limits  CK - Abnormal; Notable for the following components:   Total CK 414 (*)    All other components within normal limits  BLOOD GAS, VENOUS - Abnormal; Notable for the following components:   Acid-base deficit 3.6 (*)    All other components within normal limits  GLUCOSE, CAPILLARY - Abnormal; Notable for the following components:   Glucose-Capillary 136 (*)    All other components within normal limits  CULTURE, BLOOD (ROUTINE X 2)  CULTURE, BLOOD (ROUTINE X 2)  ETHANOL  TROPONIN I  SALICYLATE LEVEL  AMMONIA  PROTIME-INR  LACTIC ACID, PLASMA  BLOOD GAS, ARTERIAL  CBG MONITORING, ED    Lab work reviewed by me with a number of abnormalities including elevated white count and lactic acid concerning for under resuscitation versus possible infection.  Urinalysis positive for tricyclics and benzodiazepines __________________________________________  EKG  ED ECG REPORT I, Darel Hong, the attending physician, personally viewed and interpreted this ECG.  Date: 01/04/2018 EKG Time: 0017 Rate: 112 Rhythm: Sinus tachycardia QRS Axis: normal Intervals: normal ST/T Wave abnormalities: normal Narrative Interpretation: no evidence of acute ischemia  ____________________________________________  RADIOLOGY  Chest x-ray reviewed by me with no acute disease Head CT reviewed by me with no acute disease Repeat chest x-ray reviewed by me shows endotracheal tube in adequate position ____________________________________________   PROCEDURES  Procedure(s) performed: Yes  .Critical Care Performed by: Darel Hong, MD Authorized by: Darel Hong, MD    Critical care provider statement:    Critical care time (minutes):  40   Critical care time was exclusive of:  Separately billable procedures and treating other patients   Critical care was necessary to treat or prevent imminent or life-threatening deterioration of the following conditions:  Toxidrome and respiratory failure   Critical care was time spent personally by me on the following activities:  Development of treatment plan with patient or surrogate, discussions  with consultants, evaluation of patient's response to treatment, examination of patient, obtaining history from patient or surrogate, ordering and performing treatments and interventions, ordering and review of laboratory studies, ordering and review of radiographic studies, pulse oximetry, re-evaluation of patient's condition and review of old charts Procedure Name: Intubation Date/Time: 01/04/2018 1:11 AM Performed by: Darel Hong, MD Pre-anesthesia Checklist: Patient identified, Patient being monitored, Emergency Drugs available, Timeout performed and Suction available Oxygen Delivery Method: Non-rebreather mask Preoxygenation: Pre-oxygenation with 100% oxygen Induction Type: Rapid sequence Ventilation: Mask ventilation without difficulty Laryngoscope Size: Mac and 4 Grade View: Grade II Tube size: 7.5 mm Number of attempts: 1 Placement Confirmation: ETT inserted through vocal cords under direct vision,  CO2 detector and Breath sounds checked- equal and bilateral Secured at: 22 cm Tube secured with: ETT holder       Critical Care performed: Yes  Observation: no ____________________________________________   INITIAL IMPRESSION / ASSESSMENT AND PLAN / ED COURSE  Pertinent labs & imaging results that were available during my care of the patient were reviewed by me and considered in my medical decision making (see chart for details).  On arrival the patient is intermittently agitated and somnolent.  She has  coarse respirations and appears to be aspirating.  She is calm enough right now to send her to quick head CT.  Rectal temperature is 98.4 degrees.     ----------------------------------------- 1:10 AM on 01/04/2018 -----------------------------------------  The patient remained altered and was somnolent and actively aspirating.  Decision made to intubate for airway protection and during intubation the patient had copious secretions and was aspirating.  Lactic acid came back at 3.3 and while the patient has a normal core temperature I do think it is reasonable to send blood cultures and cover her with broad-spectrum antibiotics.  At this point I will continue IV fluid resuscitation and admit her to the intensive care unit.  I discussed with the hospitalist who has graciously agreed to admit the patient to his service. ____________________________________________   FINAL CLINICAL IMPRESSION(S) / ED DIAGNOSES  Final diagnoses:  Lactic acidosis  Altered mental status, unspecified altered mental status type  Encephalopathy      NEW MEDICATIONS STARTED DURING THIS VISIT:  New Prescriptions   No medications on file     Note:  This document was prepared using Dragon voice recognition software and may include unintentional dictation errors.     Darel Hong, MD 01/04/18 0140

## 2018-01-04 NOTE — ED Notes (Signed)
Pt appears to be choking on saliva in CT, strong cough eventually for 5 sec period of choking sounds, Dr Mable Paris notified, orders to prep for intubation received

## 2018-01-04 NOTE — Progress Notes (Signed)
Patient admitted to floor on vent. Unresponsive. Husband said patient was up dancing to the TV show before midnight and became unresponsive after she sat back down and continued to watch the TV so he called EMS. Will continue to come down on sedation.

## 2018-01-04 NOTE — ED Notes (Signed)
Pt to room from CT transported with RN

## 2018-01-04 NOTE — Progress Notes (Signed)
[place] [facility] [place] NAME: Kim Buchanan    MR#:  [id number]  DATE OF BIRTH:  10-09-1951  SUBJECTIVE:  CHIEF COMPLAINT:   Chief Complaint  Patient presents with  . Altered Mental Status  Case discussed with intensivist, patient remains on ventilator  REVIEW OF SYSTEMS:  CONSTITUTIONAL: No fever, fatigue or weakness.  EYES: No blurred or double vision.  EARS, NOSE, AND THROAT: No tinnitus or ear pain.  RESPIRATORY: No cough, shortness of breath, wheezing or hemoptysis.  CARDIOVASCULAR: No chest pain, orthopnea, edema.  GASTROINTESTINAL: No nausea, vomiting, diarrhea or abdominal pain.  GENITOURINARY: No dysuria, hematuria.  ENDOCRINE: No polyuria, nocturia,  HEMATOLOGY: No anemia, easy bruising or bleeding SKIN: No rash or lesion. MUSCULOSKELETAL: No joint pain or arthritis.   NEUROLOGIC: No tingling, numbness, weakness.  PSYCHIATRY: No anxiety or depression.   ROS  DRUG ALLERGIES:  No Known Allergies  VITALS:  Blood pressure (!) 86/54, pulse 82, temperature 98.9 F (37.2 C), temperature source Oral, resp. rate 19, height 5\' 5"  (1.651 m), weight 80.3 kg (177 lb 0.5 oz), SpO2 96 %.  PHYSICAL EXAMINATION:  GENERAL:  67 y.o.-year-old patient lying in the bed with no acute distress.  EYES: Pupils equal, round, reactive to light and accommodation. No scleral icterus. Extraocular muscles intact.  HEENT: Head atraumatic, normocephalic. Oropharynx and nasopharynx clear.  NECK:  Supple, no jugular venous distention. No thyroid enlargement, no tenderness.  LUNGS: Normal breath sounds bilaterally, no wheezing, rales,rhonchi or crepitation. No use of accessory muscles of respiration.  CARDIOVASCULAR: S1, S2 normal. No murmurs, rubs, or gallops.  ABDOMEN: Soft, nontender, nondistended. Bowel sounds present. No organomegaly or mass.  EXTREMITIES: No pedal edema, cyanosis, or clubbing.  NEUROLOGIC: Cranial nerves II through XII are intact.  Muscle strength 5/5 in all extremities. Sensation intact. Gait not checked.  PSYCHIATRIC: The patient is alert and oriented x 3.  SKIN: No obvious rash, lesion, or ulcer.   Physical Exam LABORATORY PANEL:   CBC Recent Labs  Lab 01/04/18 0407  WBC 10.5  HGB 12.0  HCT 36.2  PLT 203   ------------------------------------------------------------------------------------------------------------------  Chemistries  Recent Labs  Lab 01/04/18 0018 01/04/18 0407  NA 142  --   K 4.5  --   CL 108  --   CO2 21*  --   GLUCOSE 144*  --   BUN 18  --   CREATININE 0.86 0.82  CALCIUM 9.2  --   AST 32  --   ALT 35  --   ALKPHOS 95  --   BILITOT 0.4  --    ------------------------------------------------------------------------------------------------------------------  Cardiac Enzymes Recent Labs  Lab 01/04/18 0407 01/04/18 0947  TROPONINI <0.03 <0.03   ------------------------------------------------------------------------------------------------------------------  RADIOLOGY:  Dg Abd 1 View  Result Date: 01/04/2018 CLINICAL DATA:  Orogastric tube placement. EXAM: ABDOMEN - 1 VIEW COMPARISON:  Abdominal radiograph performed 11/15/2017 FINDINGS: The patient's enteric tube is noted ending overlying the fundus of the stomach. The visualized bowel gas pattern is grossly unremarkable, with a moderate to large amount of stool noted in the colon. No acute osseous abnormalities are seen. IMPRESSION: Enteric tube noted ending overlying the fundus of the stomach. Electronically Signed   By: Garald Balding M.D.   On: 01/04/2018 06:22   Ct Head Wo Contrast  Result Date: 01/04/2018 CLINICAL DATA:  Acute onset of altered level of consciousness. EXAM: CT HEAD WITHOUT CONTRAST TECHNIQUE: Contiguous axial images were obtained from the base of the skull through the  vertex without intravenous contrast. COMPARISON:  CT of the head performed 11/13/2017 FINDINGS: Brain: No evidence of acute infarction,  hemorrhage, hydrocephalus, extra-axial collection or mass lesion/mass effect. Mild periventricular white matter change likely reflects small vessel ischemic microangiopathy. The posterior fossa, including the cerebellum, brainstem and fourth ventricle, is within normal limits. The third and lateral ventricles, and basal ganglia are unremarkable in appearance. The cerebral hemispheres are symmetric in appearance, with normal gray-white differentiation. No mass effect or midline shift is seen. Vascular: No hyperdense vessel or unexpected calcification. Skull: There is no evidence of fracture; visualized osseous structures are unremarkable in appearance. Sinuses/Orbits: The orbits are within normal limits. The paranasal sinuses and mastoid air cells are well-aerated. Other: No significant soft tissue abnormalities are seen. IMPRESSION: 1. No acute intracranial pathology seen on CT. 2. Mild small vessel ischemic microangiopathy. Electronically Signed   By: Garald Balding M.D.   On: 01/04/2018 00:46   Dg Chest Port 1 View  Result Date: 01/04/2018 CLINICAL DATA:  Endotracheal tube placement. EXAM: PORTABLE CHEST 1 VIEW COMPARISON:  Chest radiograph performed earlier today at 12:22 a.m. FINDINGS: The patient's endotracheal tube is seen ending 3 cm above the carina. The lungs are mildly hypoexpanded. No pleural effusion or pneumothorax is seen. The cardiomediastinal silhouette is normal in size. No acute osseous abnormalities are identified. IMPRESSION: 1. Endotracheal tube seen ending 3 cm above the carina. 2. Lungs mildly hypoexpanded but grossly clear. Electronically Signed   By: Garald Balding M.D.   On: 01/04/2018 01:56   Dg Chest Port 1 View  Result Date: 01/04/2018 CLINICAL DATA:  Acute onset of altered mental status. Patient unresponsive. EXAM: PORTABLE CHEST 1 VIEW COMPARISON:  Chest radiograph performed 12/16/2017 FINDINGS: The lungs are well-aerated and clear. There is no evidence of focal opacification,  pleural effusion or pneumothorax. The cardiomediastinal silhouette is within normal limits. No acute osseous abnormalities are seen. IMPRESSION: No acute cardiopulmonary process seen. Electronically Signed   By: Garald Balding M.D.   On: 01/04/2018 00:45    ASSESSMENT AND PLAN:  67 yo female admitted with acute encephalopathy likely secondary to accidental vs. unintentional drug overdose requiring mechanical intubation for airway protection  1 Acute respiratory failure with hypoxia  Stable Most likely secondary to recurrent probable drug overdose-discharged last month for similar presentation  Continue ventilator with weaning as tolerated  Urine drug screen noted for benzodiazepines and tricyclics  2 acute possible sepsis  Stable ?  Secondary to aspiration pneumonia Continue sepsis protocol, empiric vancomycin/cefepime, follow-up on cultures  3 acute toxic metabolic encephalopathy Most likely secondary to acute recurrent drug overdose and acute hypoxic respiratory failure Continue to hold psychotropic meds, mechanical ventilation with weaning as tolerated, ammonia level normal, follow-up on RPR, neuro checks per routine, and continue IV fluids for rehydration  4 chronic GERD Stable PPI daily  5 chronic depression Past medical history noted also for PTSD/panic attacks consider psychiatry consultation once medically stable   All the records are reviewed and case discussed with Care Management/Social Workerr. Management plans discussed with the patient, family and they are in agreement.  CODE STATUS: full  TOTAL TIME TAKING CARE OF THIS PATIENT: 35 minutes.     POSSIBLE D/C IN 3-5 DAYS, DEPENDING ON CLINICAL CONDITION.   Avel Peace Salary M.D on 01/04/2018   Between 7am to 6pm - Pager - [phone]  After 6pm go to [email or web address] - password [email or web address] [facility]  Office  [phone]  CC: Primary care physician; Mikey College,  NP  Note:  This dictation was prepared with Dragon dictation along with smaller phrase technology. Any transcriptional errors that result from this process are unintentional.

## 2018-01-04 NOTE — Progress Notes (Signed)
CODE SEPSIS - PHARMACY COMMUNICATION  **Broad Spectrum Antibiotics should be administered within 1 hour of Sepsis diagnosis**  Time Code Sepsis Called/Page Received: 0105  Antibiotics Ordered: vanc/cefepime  Time of 1st antibiotic administration: 0215  Additional action taken by pharmacy:   If necessary, Name of Provider/Nurse Contacted:     Tobie Lords ,PharmD Clinical Pharmacist  01/04/2018  4:12 AM

## 2018-01-04 NOTE — ED Notes (Signed)
Family at bedside. 

## 2018-01-05 ENCOUNTER — Encounter: Payer: Self-pay | Admitting: Nurse Practitioner

## 2018-01-05 ENCOUNTER — Inpatient Hospital Stay: Payer: Medicare HMO

## 2018-01-05 LAB — BLOOD GAS, ARTERIAL
Acid-base deficit: 0.9 mmol/L (ref 0.0–2.0)
Bicarbonate: 23.5 mmol/L (ref 20.0–28.0)
FIO2: 0.4
MECHVT: 450 mL
Mechanical Rate: 20
O2 Saturation: 98.2 %
PEEP: 5 cmH2O
Patient temperature: 37
pCO2 arterial: 37 mmHg (ref 32.0–48.0)
pH, Arterial: 7.41 (ref 7.350–7.450)
pO2, Arterial: 108 mmHg (ref 83.0–108.0)

## 2018-01-05 LAB — BASIC METABOLIC PANEL
Anion gap: 6 (ref 5–15)
BUN: 8 mg/dL (ref 6–20)
CO2: 22 mmol/L (ref 22–32)
Calcium: 7.5 mg/dL — ABNORMAL LOW (ref 8.9–10.3)
Chloride: 117 mmol/L — ABNORMAL HIGH (ref 101–111)
Creatinine, Ser: 0.67 mg/dL (ref 0.44–1.00)
GFR calc Af Amer: >60 mL/min (ref >60)
GFR calc non Af Amer: >60 mL/min (ref >60)
Glucose, Bld: 131 mg/dL — ABNORMAL HIGH (ref 65–99)
Potassium: 3.8 mmol/L (ref 3.5–5.1)
Sodium: 145 mmol/L (ref 135–145)

## 2018-01-05 LAB — CBC
HCT: 33.2 % — ABNORMAL LOW (ref 35.0–47.0)
Hemoglobin: 11.2 g/dL — ABNORMAL LOW (ref 12.0–16.0)
MCH: 31 pg (ref 26.0–34.0)
MCHC: 33.8 g/dL (ref 32.0–36.0)
MCV: 91.7 fL (ref 80.0–100.0)
Platelets: 196 10*3/uL (ref 150–440)
RBC: 3.62 MIL/uL — ABNORMAL LOW (ref 3.80–5.20)
RDW: 15.9 % — ABNORMAL HIGH (ref 11.5–14.5)
WBC: 7.4 10*3/uL (ref 3.6–11.0)

## 2018-01-05 LAB — GLUCOSE, CAPILLARY
Glucose-Capillary: 106 mg/dL — ABNORMAL HIGH (ref 65–99)
Glucose-Capillary: 110 mg/dL — ABNORMAL HIGH (ref 65–99)
Glucose-Capillary: 93 mg/dL (ref 65–99)

## 2018-01-05 LAB — PROCALCITONIN: Procalcitonin: <0.1 ng/mL

## 2018-01-05 MED ORDER — LORAZEPAM 2 MG/ML IJ SOLN
1.0000 mg | INTRAMUSCULAR | Status: DC | PRN
  Administered 2018-01-05: 2 mg via INTRAVENOUS
  Filled 2018-01-05: qty 1

## 2018-01-05 MED ORDER — VITAL HIGH PROTEIN PO LIQD
1000.0000 mL | ORAL | Status: DC
  Administered 2018-01-05 (×2): 1000 mL
  Administered 2018-01-06: 14:00:00
  Administered 2018-01-06: 1000 mL

## 2018-01-05 MED ORDER — ADULT MULTIVITAMIN LIQUID CH
15.0000 mL | Freq: Every day | ORAL | Status: DC
  Administered 2018-01-05 – 2018-01-07 (×3): 15 mL
  Filled 2018-01-05 (×3): qty 15

## 2018-01-05 NOTE — Progress Notes (Signed)
Patient becomes combative when sedation turned down or off. Unable to redirect and does not follow commands.

## 2018-01-05 NOTE — Progress Notes (Signed)
Initial Nutrition Assessment  DOCUMENTATION CODES:   Not applicable  INTERVENTION:  Initiate Vital High Protein at 15 mL/hr via OGT and advance by 20 mL/hr every 8 hours to goal rate of 55 mL/hr (1320 mL goal daily volume). Provides 1320 kcal, 116 grams of protein, 1109 mL H2O daily. With current propofol rate provides 1573 kcal.  Provide liquid MVI daily per tube as goal TF rate does not meet 100% RDIs for vitamins/minerals.  Recommend initiating bowel regimen with initiation of tube feeds as patient already has moderate to large amount of stool in colon and is on fentanyl gtt.  NUTRITION DIAGNOSIS:   Inadequate oral intake related to inability to eat as evidenced by NPO status.  GOAL:   Provide needs based on ASPEN/SCCM guidelines  MONITOR:   Vent status, Labs, Weight trends, TF tolerance, I & O's  REASON FOR ASSESSMENT:   Ventilator    ASSESSMENT:   67 year old female with PMHx of anxiety, depression, panic attacks, PTSD, polysubstance abuse, GERD, HLD, leaky heart valve, hx of rectal fistula, arthritis, sleep apnea who is admitted with acute respiratory failure and acute encephalopathy likely secondary to accidental drug overdose requiring intubation 3/16. Of note, patient was admitted 1/23-2/1 also for accidental drug overdose and required mechanical intubation after which she discharge home with home health.   -Per abdominal x-ray 3/16 patient has moderate to large amount of stool in colon. Of note, patient also had a stool-filled colon on her abdominal x-ray last admission.  Patient intubated and sedated. Spoke with husband at bedside. He and patient are known to this RD from previous admission in 10/2017. Husband reports patient was eating well once she got home after last admission. He reports her UBW had been around 163 lbs. On 11/14/2017 patient was 162.5 lbs (73.8 kg). On admission she was 80 kg. Current weight likely falsely elevated with fluid so will use 80 kg to  estimate needs. Abdomen soft on NFPE.  Access: 14 Fr. OGT placed 3/16; terminates in fundus of stomach per abdominal x-ray 3/16; 54 cm at corner of mouth  MAP: 58-89 mmHg  Patient is currently intubated on ventilator support MV: 9.3 L/min Temp (24hrs), Avg:98.3 F (36.8 C), Min:97.6 F (36.4 C), Max:98.8 F (37.1 C)  Propofol: 9.6 ml/hr (253 kcal daily)  Medications reviewed and include: pantoprazole, NS @ 75 mL/hr, fentanyl gtt, norepinephrine gtt at 3 mcg/min, propofol gtt.  Labs reviewed: Chloride 117, Glucose 131.  I/O: 1550 mL UOP yesterday (0.8 mL/kg/hr)  Patient does not meet criteria for malnutrition at this time.  Discussed with RN and MD. Faythe Ghee to start tube feeds today.  NUTRITION - FOCUSED PHYSICAL EXAM:    Most Recent Value  Orbital Region  No depletion  Upper Arm Region  No depletion  Thoracic and Lumbar Region  No depletion  Buccal Region  Unable to assess  Temple Region  No depletion  Clavicle Bone Region  No depletion  Clavicle and Acromion Bone Region  No depletion  Scapular Bone Region  Unable to assess  Dorsal Hand  No depletion  Patellar Region  No depletion  Anterior Thigh Region  No depletion  Posterior Calf Region  No depletion  Edema (RD Assessment)  Mild  Hair  Reviewed  Eyes  Unable to assess  Mouth  Unable to assess  Skin  Reviewed  Nails  Reviewed     Diet Order:  Diet NPO time specified Aspiration precautions Fall precautions  EDUCATION NEEDS:   No education needs  have been identified at this time  Skin:  Skin Assessment: Reviewed RN Assessment  Last BM:  PTA (01/03/2018 per chart)  Height:   Ht Readings from Last 1 Encounters:  01/04/18 5\' 5"  (1.651 m)    Weight:   Wt Readings from Last 1 Encounters:  01/05/18 180 lb 1.9 oz (81.7 kg)    Ideal Body Weight:  56.8 kg  BMI:  Body mass index is 29.97 kg/m.  Estimated Nutritional Needs:   Kcal:  7939 (PSU 2003b w/ MSJ 1355, Ve 9.3, Tmax 37.1)  Protein:  96-120  grams (1.2-1.5 grams/kg)  Fluid:  1.7-2 L/day (30-35 mL/kg IBW)  Willey Blade, MS, RD, LDN Office: [phone] Pager: [phone] After Hours/Weekend Pager: [phone]

## 2018-01-05 NOTE — Progress Notes (Signed)
[facility] Progess Note    SYNOPSIS   67 yo female with a known polysubstance abuse hx admitted with acute respiratory failure secondary to aspiraton and acute encephalopathy likely secondary to accidental vs. unintentional drug overdose requiring mechanical intubation    ASSESSMENT/PLAN   Respiratory failure.  Unclear etiology of encephalopathy, felt to be secondary to accidental/unintentional drug overdose.  Will keep on mechanical ventilation and wean sedation as tolerated, when more appropriate will perform breathing trial  Aspiration.  No clinical evidence of aspiration pneumonia.  Chest x-ray does not show, afebrile with normal white count, will stop Unasyn  Critical care time 35 minutes  VENTILATOR SETTINGS: Vent Mode: PRVC FiO2 (%):  [40 %] 40 % Set Rate:  [20 bmp] 20 bmp Vt Set:  [450 mL] 450 mL PEEP:  [5 cmH20] Parcoal Pressure:  [17 cmH20] 17 cmH20  INTAKE / OUTPUT:  Intake/Output Summary (Last 24 hours) at 01/05/2018 0923 Last data filed at 01/05/2018 0500 Gross per 24 hour  Intake 4943.16 ml  Output 1550 ml  Net 3393.16 ml    Name: Kim Buchanan MRN: [id number] DOB: [birth year]    ADMISSION DATE:  01/04/2018  SUBJECTIVE:   Pt currently on the ventilator, can not provide history or review of systems. Did  Not tolerate decreasing sedation yesterday.   VITAL SIGNS: Temp:  [97.6 F (36.4 C)-98.8 F (37.1 C)] 98.7 F (37.1 C) (03/17 0800) Pulse Rate:  [60-104] 64 (03/17 0800) Resp:  [19-27] 20 (03/17 0800) BP: (77-160)/(42-118) 108/51 (03/17 0800) SpO2:  [96 %-100 %] 100 % (03/17 0800) FiO2 (%):  [40 %] 40 % (03/17 0723) Weight:  [81.7 kg (180 lb 1.9 oz)] 81.7 kg (180 lb 1.9 oz) (03/17 0500)  PHYSICAL EXAMINATION: Physical Examination:   VS: BP (!) 108/51 (BP Location: Right Arm)   Pulse 64   Temp 98.7 F (37.1 C) (Oral)   Resp 20   Ht 5\' 5"  (1.651 m)   Wt 81.7 kg (180 lb 1.9 oz)   SpO2 100%   BMI 29.97 kg/m    General Appearance: No distress, sedated Neuro:limited exam moves all extremities when sedation lightened HEENT: Orally intubated, trachea is midline, no thyromegaly noted or jugular venous distention Pulmonary: normal breath sounds   CardiovascularNormal S1,S2.  No m/r/g.   Abdomen: soft exam Skin:   warm, no rashes, no ecchymosis  Extremities: normal, no cyanosis, clubbing.    LABORATORY PANEL:   CBC Recent Labs  Lab 01/05/18 0335  WBC 7.4  HGB 11.2*  HCT 33.2*  PLT 196    Chemistries  Recent Labs  Lab 01/04/18 0018  01/05/18 0335  NA 142  --  145  K 4.5  --  3.8  CL 108  --  117*  CO2 21*  --  22  GLUCOSE 144*  --  131*  BUN 18  --  8  CREATININE 0.86   < > 0.67  CALCIUM 9.2  --  7.5*  AST 32  --   --   ALT 35  --   --   ALKPHOS 95  --   --   BILITOT 0.4  --   --    < > = values in this interval not displayed.    Recent Labs  Lab 01/04/18 0051  GLUCAP 136*   Recent Labs  Lab 01/04/18 0159 01/04/18 0600 01/05/18 0447  PHART 7.27* 7.31* 7.41  PCO2ART 51* 48 37  PO2ART 71* 115* 108  Recent Labs  Lab 01/01/18 0000 01/04/18 0018  AST 21 32  ALT 38* 35  ALKPHOS  --  95  BILITOT 0.4 0.4  ALBUMIN  --  4.1    Cardiac Enzymes Recent Labs  Lab 01/04/18 1522  TROPONINI <0.03    RADIOLOGY:  Dg Abd 1 View  Result Date: 01/04/2018 CLINICAL DATA:  Orogastric tube placement. EXAM: ABDOMEN - 1 VIEW COMPARISON:  Abdominal radiograph performed 11/15/2017 FINDINGS: The patient's enteric tube is noted ending overlying the fundus of the stomach. The visualized bowel gas pattern is grossly unremarkable, with a moderate to large amount of stool noted in the colon. No acute osseous abnormalities are seen. IMPRESSION: Enteric tube noted ending overlying the fundus of the stomach. Electronically Signed   By: Garald Balding M.D.   On: 01/04/2018 06:22   Ct Head Wo Contrast  Result Date: 01/04/2018 CLINICAL DATA:  Acute onset of altered level of  consciousness. EXAM: CT HEAD WITHOUT CONTRAST TECHNIQUE: Contiguous axial images were obtained from the base of the skull through the vertex without intravenous contrast. COMPARISON:  CT of the head performed 11/13/2017 FINDINGS: Brain: No evidence of acute infarction, hemorrhage, hydrocephalus, extra-axial collection or mass lesion/mass effect. Mild periventricular white matter change likely reflects small vessel ischemic microangiopathy. The posterior fossa, including the cerebellum, brainstem and fourth ventricle, is within normal limits. The third and lateral ventricles, and basal ganglia are unremarkable in appearance. The cerebral hemispheres are symmetric in appearance, with normal gray-white differentiation. No mass effect or midline shift is seen. Vascular: No hyperdense vessel or unexpected calcification. Skull: There is no evidence of fracture; visualized osseous structures are unremarkable in appearance. Sinuses/Orbits: The orbits are within normal limits. The paranasal sinuses and mastoid air cells are well-aerated. Other: No significant soft tissue abnormalities are seen. IMPRESSION: 1. No acute intracranial pathology seen on CT. 2. Mild small vessel ischemic microangiopathy. Electronically Signed   By: Garald Balding M.D.   On: 01/04/2018 00:46   Dg Chest Port 1 View  Result Date: 01/04/2018 CLINICAL DATA:  Endotracheal tube placement. EXAM: PORTABLE CHEST 1 VIEW COMPARISON:  Chest radiograph performed earlier today at 12:22 a.m. FINDINGS: The patient's endotracheal tube is seen ending 3 cm above the carina. The lungs are mildly hypoexpanded. No pleural effusion or pneumothorax is seen. The cardiomediastinal silhouette is normal in size. No acute osseous abnormalities are identified. IMPRESSION: 1. Endotracheal tube seen ending 3 cm above the carina. 2. Lungs mildly hypoexpanded but grossly clear. Electronically Signed   By: Garald Balding M.D.   On: 01/04/2018 01:56   Dg Chest Port 1  View  Result Date: 01/04/2018 CLINICAL DATA:  Acute onset of altered mental status. Patient unresponsive. EXAM: PORTABLE CHEST 1 VIEW COMPARISON:  Chest radiograph performed 12/14/2017 FINDINGS: The lungs are well-aerated and clear. There is no evidence of focal opacification, pleural effusion or pneumothorax. The cardiomediastinal silhouette is within normal limits. No acute osseous abnormalities are seen. IMPRESSION: No acute cardiopulmonary process seen. Electronically Signed   By: Garald Balding M.D.   On: 01/04/2018 00:45   Hermelinda Dellen, DO  01/05/2018

## 2018-01-05 NOTE — Progress Notes (Signed)
[facility] [place] NAME: Kim Buchanan    MR#:  [id number]  DATE OF BIRTH:  12-25-1950  SUBJECTIVE:  CHIEF COMPLAINT:   Chief Complaint  Patient presents with  . Altered Mental Status  Case discussed with intensivist, continue need for ventilator for the next several days, on pressors to maintain adequate cerebral perfusion  REVIEW OF SYSTEMS:  CONSTITUTIONAL: No fever, fatigue or weakness.  EYES: No blurred or double vision.  EARS, NOSE, AND THROAT: No tinnitus or ear pain.  RESPIRATORY: No cough, shortness of breath, wheezing or hemoptysis.  CARDIOVASCULAR: No chest pain, orthopnea, edema.  GASTROINTESTINAL: No nausea, vomiting, diarrhea or abdominal pain.  GENITOURINARY: No dysuria, hematuria.  ENDOCRINE: No polyuria, nocturia,  HEMATOLOGY: No anemia, easy bruising or bleeding SKIN: No rash or lesion. MUSCULOSKELETAL: No joint pain or arthritis.   NEUROLOGIC: No tingling, numbness, weakness.  PSYCHIATRY: No anxiety or depression.   ROS  DRUG ALLERGIES:  No Known Allergies  VITALS:  Blood pressure (!) 105/55, pulse 66, temperature 98.7 F (37.1 C), temperature source Oral, resp. rate 20, height 5\' 5"  (1.651 m), weight 81.7 kg (180 lb 1.9 oz), SpO2 100 %.  PHYSICAL EXAMINATION:  GENERAL:  67 y.o.-year-old patient lying in the bed with no acute distress.  EYES: Pupils equal, round, reactive to light and accommodation. No scleral icterus. Extraocular muscles intact.  HEENT: Head atraumatic, normocephalic. Oropharynx and nasopharynx clear.  NECK:  Supple, no jugular venous distention. No thyroid enlargement, no tenderness.  LUNGS: Normal breath sounds bilaterally, no wheezing, rales,rhonchi or crepitation. No use of accessory muscles of respiration.  CARDIOVASCULAR: S1, S2 normal. No murmurs, rubs, or gallops.  ABDOMEN: Soft, nontender, nondistended. Bowel sounds present. No organomegaly or mass.  EXTREMITIES: No pedal edema,  cyanosis, or clubbing.  NEUROLOGIC: Cranial nerves II through XII are intact. Muscle strength 5/5 in all extremities. Sensation intact. Gait not checked.  PSYCHIATRIC: The patient is alert and oriented x 3.  SKIN: No obvious rash, lesion, or ulcer.   Physical Exam LABORATORY PANEL:   CBC Recent Labs  Lab 01/05/18 0335  WBC 7.4  HGB 11.2*  HCT 33.2*  PLT 196   ------------------------------------------------------------------------------------------------------------------  Chemistries  Recent Labs  Lab 01/04/18 0018  01/05/18 0335  NA 142  --  145  K 4.5  --  3.8  CL 108  --  117*  CO2 21*  --  22  GLUCOSE 144*  --  131*  BUN 18  --  8  CREATININE 0.86   < > 0.67  CALCIUM 9.2  --  7.5*  AST 32  --   --   ALT 35  --   --   ALKPHOS 95  --   --   BILITOT 0.4  --   --    < > = values in this interval not displayed.   ------------------------------------------------------------------------------------------------------------------  Cardiac Enzymes Recent Labs  Lab 01/04/18 0947 01/04/18 1522  TROPONINI <0.03 <0.03   ------------------------------------------------------------------------------------------------------------------  RADIOLOGY:  Dg Abd 1 View  Result Date: 01/04/2018 CLINICAL DATA:  Orogastric tube placement. EXAM: ABDOMEN - 1 VIEW COMPARISON:  Abdominal radiograph performed 11/15/2017 FINDINGS: The patient's enteric tube is noted ending overlying the fundus of the stomach. The visualized bowel gas pattern is grossly unremarkable, with a moderate to large amount of stool noted in the colon. No acute osseous abnormalities are seen. IMPRESSION: Enteric tube noted ending overlying the fundus of the stomach. Electronically Signed   By: Jacqulynn Cadet  Chang M.D.   On: 01/04/2018 06:22   Ct Head Wo Contrast  Result Date: 01/04/2018 CLINICAL DATA:  Acute onset of altered level of consciousness. EXAM: CT HEAD WITHOUT CONTRAST TECHNIQUE: Contiguous axial images were  obtained from the base of the skull through the vertex without intravenous contrast. COMPARISON:  CT of the head performed 11/13/2017 FINDINGS: Brain: No evidence of acute infarction, hemorrhage, hydrocephalus, extra-axial collection or mass lesion/mass effect. Mild periventricular white matter change likely reflects small vessel ischemic microangiopathy. The posterior fossa, including the cerebellum, brainstem and fourth ventricle, is within normal limits. The third and lateral ventricles, and basal ganglia are unremarkable in appearance. The cerebral hemispheres are symmetric in appearance, with normal gray-white differentiation. No mass effect or midline shift is seen. Vascular: No hyperdense vessel or unexpected calcification. Skull: There is no evidence of fracture; visualized osseous structures are unremarkable in appearance. Sinuses/Orbits: The orbits are within normal limits. The paranasal sinuses and mastoid air cells are well-aerated. Other: No significant soft tissue abnormalities are seen. IMPRESSION: 1. No acute intracranial pathology seen on CT. 2. Mild small vessel ischemic microangiopathy. Electronically Signed   By: Garald Balding M.D.   On: 01/04/2018 00:46   Dg Chest Port 1 View  Result Date: 01/05/2018 CLINICAL DATA:  Smoker, code sepsis initiated, CT head negative, chest x-ray negative, patient evaluated in the emergency room, resting comfortably intubated on propofol drip, patient is now being admitted for acute hypoxic respiratory failure,.*comment was truncated* EXAM: PORTABLE CHEST 1 VIEW COMPARISON:  01/04/2018 FINDINGS: Endotracheal tube and NG tube good position. LEFT basilar atelectasis is increased. Upper lobes are clear. Low lung volumes. IMPRESSION: 1. Increased LEFT lower lobe atelectasis. 2. Endotracheal tube and NG tube appear in good position. Electronically Signed   By: Suzy Bouchard M.D.   On: 01/05/2018 11:29   Dg Chest Port 1 View  Result Date: 01/04/2018 CLINICAL  DATA:  Endotracheal tube placement. EXAM: PORTABLE CHEST 1 VIEW COMPARISON:  Chest radiograph performed earlier today at 12:22 a.m. FINDINGS: The patient's endotracheal tube is seen ending 3 cm above the carina. The lungs are mildly hypoexpanded. No pleural effusion or pneumothorax is seen. The cardiomediastinal silhouette is normal in size. No acute osseous abnormalities are identified. IMPRESSION: 1. Endotracheal tube seen ending 3 cm above the carina. 2. Lungs mildly hypoexpanded but grossly clear. Electronically Signed   By: Garald Balding M.D.   On: 01/04/2018 01:56   Dg Chest Port 1 View  Result Date: 01/04/2018 CLINICAL DATA:  Acute onset of altered mental status. Patient unresponsive. EXAM: PORTABLE CHEST 1 VIEW COMPARISON:  Chest radiograph performed 12/04/2017 FINDINGS: The lungs are well-aerated and clear. There is no evidence of focal opacification, pleural effusion or pneumothorax. The cardiomediastinal silhouette is within normal limits. No acute osseous abnormalities are seen. IMPRESSION: No acute cardiopulmonary process seen. Electronically Signed   By: Garald Balding M.D.   On: 01/04/2018 00:45    ASSESSMENT AND PLAN:  67 yo female admitted with acute encephalopathy likely secondary to accidental vs. unintentional drug overdose requiring mechanical intubation for airway protection  1Acute respiratory failure with hypoxia  Stable Most likely secondary to recurrent probable drug overdose-discharged last month for similar presentation  Continue ventilator with weaning as tolerated  Urine drug screen noted for benzodiazepines and tricyclics  2acute toxic metabolic encephalopathy Most likely secondary to acute recurrent drug overdose and acute hypoxic respiratory failure Continue to hold psychotropic meds, mechanical ventilation with weaning as tolerated, ammonia level normal, follow-up on RPR, neuro checks  per routine, and continue IV fluids for rehydration  3chronic  GERD Stable PPI daily  4chronic depression Past medical history noted also for PTSD/panic attacks consider psychiatry consultation once medically stable  5acute possible sepsis  Ruled out  All the records are reviewed and case discussed with Care Management/Social Workerr. Management plans discussed with the patient, family and they are in agreement.  CODE STATUS: full  TOTAL TIME TAKING CARE OF THIS PATIENT: 35 minutes.     POSSIBLE D/C IN 3-5 DAYS, DEPENDING ON CLINICAL CONDITION.   Avel Peace Salary M.D on 01/05/2018   Between 7am to 6pm - Pager - [phone]  After 6pm go to [email or web address] - password [email or web address] [facility]  Office  [phone]  CC: Primary care physician; Mikey College, NP  Note: This dictation was prepared with Dragon dictation along with smaller phrase technology. Any transcriptional errors that result from this process are unintentional.

## 2018-01-06 ENCOUNTER — Other Ambulatory Visit: Payer: Self-pay | Admitting: Nurse Practitioner

## 2018-01-06 DIAGNOSIS — E782 Mixed hyperlipidemia: Secondary | ICD-10-CM

## 2018-01-06 DIAGNOSIS — J9600 Acute respiratory failure, unspecified whether with hypoxia or hypercapnia: Secondary | ICD-10-CM

## 2018-01-06 DIAGNOSIS — I959 Hypotension, unspecified: Secondary | ICD-10-CM

## 2018-01-06 DIAGNOSIS — I25118 Atherosclerotic heart disease of native coronary artery with other forms of angina pectoris: Secondary | ICD-10-CM

## 2018-01-06 DIAGNOSIS — G9340 Encephalopathy, unspecified: Secondary | ICD-10-CM

## 2018-01-06 LAB — PHOSPHORUS: Phosphorus: 4.1 mg/dL (ref 2.5–4.6)

## 2018-01-06 LAB — GLUCOSE, CAPILLARY
Glucose-Capillary: 108 mg/dL — ABNORMAL HIGH (ref 65–99)
Glucose-Capillary: 111 mg/dL — ABNORMAL HIGH (ref 65–99)
Glucose-Capillary: 121 mg/dL — ABNORMAL HIGH (ref 65–99)
Glucose-Capillary: 127 mg/dL — ABNORMAL HIGH (ref 65–99)
Glucose-Capillary: 129 mg/dL — ABNORMAL HIGH (ref 65–99)
Glucose-Capillary: 156 mg/dL — ABNORMAL HIGH (ref 65–99)
Glucose-Capillary: 96 mg/dL (ref 65–99)

## 2018-01-06 LAB — MAGNESIUM: Magnesium: 1.7 mg/dL (ref 1.7–2.4)

## 2018-01-06 LAB — PROCALCITONIN: Procalcitonin: <0.1 ng/mL

## 2018-01-06 LAB — RPR: RPR Ser Ql: NONREACTIVE

## 2018-01-06 MED ORDER — ATORVASTATIN CALCIUM 20 MG PO TABS
20.0000 mg | ORAL_TABLET | Freq: Every day | ORAL | 11 refills | Status: DC
Start: 2018-01-06 — End: 2018-01-11

## 2018-01-06 MED ORDER — PANTOPRAZOLE SODIUM 40 MG PO PACK
40.0000 mg | PACK | Freq: Every day | ORAL | Status: DC
  Administered 2018-01-06: 40 mg
  Filled 2018-01-06: qty 20

## 2018-01-06 MED ORDER — FREE WATER
200.0000 mL | Freq: Three times a day (TID) | Status: DC
  Administered 2018-01-06 – 2018-01-07 (×3): 200 mL

## 2018-01-06 MED ORDER — BISACODYL 10 MG RE SUPP: 10.0000 mg | Freq: Every day | RECTAL | Status: DC | PRN

## 2018-01-06 MED ORDER — SENNA 8.6 MG PO TABS: 2.0000 | ORAL_TABLET | Freq: Every day | ORAL | Status: DC | PRN

## 2018-01-06 MED ORDER — BUDESONIDE 0.25 MG/2ML IN SUSP: 0.2500 mg | Freq: Four times a day (QID) | RESPIRATORY_TRACT | Status: DC

## 2018-01-06 MED ORDER — VITAL HIGH PROTEIN PO LIQD: 1000.0000 mL | ORAL | Status: DC

## 2018-01-06 MED ORDER — DOCUSATE SODIUM 50 MG/5ML PO LIQD
100.0000 mg | Freq: Two times a day (BID) | ORAL | Status: DC
  Administered 2018-01-06 – 2018-01-07 (×2): 100 mg
  Filled 2018-01-06 (×2): qty 10

## 2018-01-06 MED ORDER — VITAL HIGH PROTEIN PO LIQD
1000.0000 mL | ORAL | Status: DC
  Administered 2018-01-06: 1000 mL

## 2018-01-06 MED ORDER — ISOSORBIDE DINITRATE 30 MG PO TABS: 30.0000 mg | ORAL_TABLET | Freq: Every day | ORAL | 3 refills | Status: DC

## 2018-01-06 MED ORDER — ALBUTEROL SULFATE (2.5 MG/3ML) 0.083% IN NEBU
2.5000 mg | INHALATION_SOLUTION | RESPIRATORY_TRACT | Status: DC | PRN
  Administered 2018-01-07: 2.5 mg via RESPIRATORY_TRACT
  Filled 2018-01-06: qty 3

## 2018-01-06 MED ORDER — BUDESONIDE 0.25 MG/2ML IN SUSP
0.2500 mg | Freq: Four times a day (QID) | RESPIRATORY_TRACT | Status: DC
  Administered 2018-01-06 – 2018-01-08 (×7): 0.25 mg via RESPIRATORY_TRACT
  Filled 2018-01-06 (×7): qty 2

## 2018-01-06 MED ORDER — PRO-STAT SUGAR FREE PO LIQD
30.0000 mL | Freq: Three times a day (TID) | ORAL | Status: DC
  Administered 2018-01-06 – 2018-01-07 (×3): 30 mL

## 2018-01-06 MED ORDER — IPRATROPIUM-ALBUTEROL 0.5-2.5 (3) MG/3ML IN SOLN
3.0000 mL | Freq: Four times a day (QID) | RESPIRATORY_TRACT | Status: DC
  Administered 2018-01-06 – 2018-01-08 (×7): 3 mL via RESPIRATORY_TRACT
  Filled 2018-01-06 (×7): qty 3

## 2018-01-06 NOTE — Progress Notes (Signed)
PHARMACIST - PHYSICIAN COMMUNICATION  CONCERNING: IV to Oral Route Change Policy  RECOMMENDATION: This patient is receiving pantoprazole by the intravenous route.  Based on criteria approved by the Pharmacy and Therapeutics Committee, the intravenous medication(s) is/are being converted to the equivalent oral dose form(s).   DESCRIPTION: These criteria include:  The patient is eating (either orally or via tube) and/or has been taking other orally administered medications for a least 24 hours  The patient has no evidence of active gastrointestinal bleeding or impaired GI absorption (gastrectomy, short bowel, patient on TNA or NPO).  If you have questions about this conversion, please contact the Pharmacy Department  []   [phone] )  [facility] [x]   [phone] )  [facility] []   [phone] )  [facility] []   [phone] )  [facility] []   [phone] )  [place], [facility] 01/06/2018 3:53 PM

## 2018-01-06 NOTE — Progress Notes (Signed)
PULMONARY / CRITICAL CARE MEDICINE   Name: Kim Buchanan MRN: [id number] DOB: Nov 12, 1950    ADMISSION DATE:  01/04/2018  PT PROFILE: 62 F smoker admitted via EMS/ED with acute alteration in MS and intubated in ED for airway protection. UDS + for benzo's and TCA (both are prescribed meds)  MAJOR EVENTS/TEST RESULTS: 03/16 CT head: No acute findings  INDWELLING DEVICES:: ETT 03/16 >>  RUE PICC 03/17 >>   MICRO DATA: MRSA PCR 03/16 >> NEG Blood 03/16 >>   ANTIMICROBIALS:  Vanc 03/16 >> 03/16 Pip-tazo 03/16 >> 03/16 Cefepime 03/16 >> 03/16  Amp-sulbactam 03/16 >> 03/17     SUBJECTIVE:  Initially deeply unresponsive, RASS -4, -5 on propofol/fent infusions. On WUA became more responsive and somewhat agitated. Not F/C  VITAL SIGNS: BP (!) 103/51   Pulse (!) 103   Temp (!) 102.2 F (39 C) (Oral)   Resp 20   Ht 5\' 5"  (1.651 m)   Wt 83.1 kg (183 lb 3.2 oz)   SpO2 95%   BMI 30.49 kg/m   HEMODYNAMICS:    VENTILATOR SETTINGS: Vent Mode: PRVC FiO2 (%):  [35 %-40 %] 35 % Set Rate:  [20 bmp] 20 bmp Vt Set:  [450 mL] 450 mL PEEP:  [5 cmH20] 5 cmH20 Plateau Pressure:  [18 cmH20-22 cmH20] 22 cmH20  INTAKE / OUTPUT: I/O last 3 completed shifts: In: 5546.1 [I.V.:4857.7; NG/GT:488.3; IV Piggyback:200] Out: 3325 [Urine:3325]  PHYSICAL EXAMINATION: General: Intubated, sedated Neuro: PERRLA, EOMI, moves all extremities HEENT: NCAT, sclerae white Cardiovascular: Regular, no M Lungs: Diffuse rhonchi, prolonged expiratory phase Abdomen: Soft, nondistended, bowel sounds present Extremities: Warm, no edema Skin: No lesions noted  LABS:  BMET Recent Labs  Lab 01/01/18 0000 01/04/18 0018 01/04/18 0407 01/05/18 0335  NA 140 142  --  145  K 4.3 4.5  --  3.8  CL 107 108  --  117*  CO2 26 21*  --  22  BUN 24 18  --  8  CREATININE 0.82 0.86 0.82 0.67  GLUCOSE 80 144*  --  131*    Electrolytes Recent Labs  Lab 01/01/18 0000 01/04/18 0018 01/05/18 0335  01/06/18 0422  CALCIUM 8.8 9.2 7.5*  --   MG  --   --   --  1.7  PHOS  --   --   --  4.1    CBC Recent Labs  Lab 01/04/18 0018 01/04/18 0407 01/05/18 0335  WBC 13.3* 10.5 7.4  HGB 13.4 12.0 11.2*  HCT 41.1 36.2 33.2*  PLT 232 203 196    Coag's Recent Labs  Lab 01/04/18 0018 01/04/18 0407  APTT  --  24  INR 0.89 0.96    Sepsis Markers Recent Labs  Lab 01/04/18 0018 01/04/18 0200 01/04/18 0407 01/05/18 0335 01/06/18 0422  LATICACIDVEN 3.3* 1.6  --   --   --   PROCALCITON <0.10  --  <0.10 <0.10 <0.10    ABG Recent Labs  Lab 01/04/18 0600 01/05/18 0447 01/06/18 0500  PHART 7.31* 7.41 7.34*  PCO2ART 48 37 45  PO2ART 115* 108 86    Liver Enzymes Recent Labs  Lab 01/01/18 0000 01/04/18 0018  AST 21 32  ALT 38* 35  ALKPHOS  --  95  BILITOT 0.4 0.4  ALBUMIN  --  4.1    Cardiac Enzymes Recent Labs  Lab 01/04/18 0407 01/04/18 0947 01/04/18 1522  TROPONINI <0.03 <0.03 <0.03    Glucose Recent Labs  Lab 01/05/18 1959 01/05/18 2354  01/06/18 0416 01/06/18 0740 01/06/18 1133 01/06/18 1620  GLUCAP 110* 106* 127* 121* 108* 111*    CXR 03/17: LLV, bibasilar atelectasis, no definite infiltrates or edema   ASSESSMENT / PLAN:  PULMONARY A: Acute respiratory failure - initially intubated for AMS Smoker with suspected COPD Obstructive sleep apnea P:   Cont vent support - settings reviewed and/or adjusted Cont vent bundle Daily SBT if/when meets criteria Continue nebulized steroids.  Dose adjusted Continue nebulized bronchodilators.  Regimen adjusted  CARDIOVASCULAR A:  Hypotension, unclear etiology P:  Continue hemodynamic monitoring Continue norepinephrine to maintain MAP >65 mmHg  RENAL A:   Hypernatremia P:   Monitor BMET intermittently Monitor I/Os Correct electrolytes as indicated Free water initiated 03/18  GASTROINTESTINAL A:   Obstipation P:   SUP: enteral PPI Continue TF protocol Bowel regimen initiated  03/18  HEMATOLOGIC A:   Mild anemia without acute blood loss P:  DVT px: SQ heparin Monitor CBC intermittently Transfuse per usual guidelines   INFECTIOUS A:   No definite infections identified P:   Monitor temp, WBC count Micro and abx as above  Monitoring off antibiotics Check PCT in AM  ENDOCRINE A:   Mild hyperglycemia without documented history of DM P:   Continue SSI protocol  NEUROLOGIC A:   Acute encephalopathy of unclear etiology P:   RASS goal: -1, -2 Continue PAD protocol - propofol, fentanyl   FAMILY  - Updates: Has been updated at bedside   CCM time: 35 mins The above time includes time spent in consultation with patient and/or family members and reviewing care plan on multidisciplinary rounds  Merton Border, MD PCCM service Mobile [phone] Pager [phone]    01/06/2018, 5:13 PM

## 2018-01-06 NOTE — Progress Notes (Signed)
Nutrition Follow-up  DOCUMENTATION CODES:   Not applicable  INTERVENTION:  Initiate new goal regimen of Vital High Protein at 30 mL/hr (720 mL goal daily volume) + Pro-Stat 30 mL TID via OGT. Provides 1020 kcal, 108 grams of protein, 605 mL H2O daily. With current propofol rate provides 1593 kcal daily.  Continue liquid MVI daily per tube.  Recommend initiating bowel regimen.  NUTRITION DIAGNOSIS:   Inadequate oral intake related to inability to eat as evidenced by NPO status.  Ongoing - addressing with TF regimen.  GOAL:   Provide needs based on ASPEN/SCCM guidelines  Met with TF regimen.  MONITOR:   Vent status, Labs, Weight trends, TF tolerance, I & O's  REASON FOR ASSESSMENT:   Ventilator    ASSESSMENT:   67 year old female with PMHx of anxiety, depression, panic attacks, PTSD, polysubstance abuse, GERD, HLD, leaky heart valve, hx of rectal fistula, arthritis, sleep apnea who is admitted with acute respiratory failure and acute encephalopathy likely secondary to accidental drug overdose requiring intubation 3/16. Of note, patient was admitted 1/23-2/1 also for accidental drug overdose and required mechanical intubation after which she discharge home with home health.  Patient intubated and sedated. No family members present at time of RD assessment. Abdomen soft on NFPE.  Access: 14 Fr. OGT placed 3/16; terminates in fundus of stomach per abdominal x-ray 3/16; 54 cm at corner of mouth  MAP: 54-104 mmHg  TF: pt tolerating VHP at 55 mL/hr (actually currently at higher rate of 66 mL/hr for PEPuP protocol)  Patient is currently intubated on ventilator support MV: 8 L/min Temp (24hrs), Avg:100 F (37.8 C), Min:98.3 F (36.8 C), Max:102.2 F (39 C)  Propofol: 21.7 ml/hr (573 kcal daily)  Medications reviewed and include: liquid MVI daily per tube, pantoprazole, fentanyl gtt, norepinephrine gtt at 5 mcg/min, propofol gtt.  Labs reviewed: CBG 93-127 past 24 hrs,  Chloride 117.  I/O: 2425 mL UOP yesterday (1.2 mL/kg/hr)  Discussed with RN and on rounds.  Diet Order:  Diet NPO time specified Aspiration precautions Fall precautions  EDUCATION NEEDS:   No education needs have been identified at this time  Skin:  Skin Assessment: Reviewed RN Assessment  Last BM:  PTA (01/03/2018 per chart)  Height:   Ht Readings from Last 1 Encounters:  01/04/18 '5\' 5"'  (1.651 m)    Weight:   Wt Readings from Last 1 Encounters:  01/06/18 183 lb 3.2 oz (83.1 kg)    Ideal Body Weight:  56.8 kg  BMI:  Body mass index is 30.49 kg/m.  Estimated Nutritional Needs:   Kcal:  8372 (PSU 2003b w/ MSJ 1355, Ve 9.3, Tmax 37.1)  Protein:  96-120 grams (1.2-1.5 grams/kg)  Fluid:  1.7-2 L/day (30-35 mL/kg IBW)  Willey Blade, MS, RD, LDN Office: [phone] Pager: [phone] After Hours/Weekend Pager: [phone]

## 2018-01-06 NOTE — Progress Notes (Signed)
[facility]  Patient Status: Active  [facility] is providing the following services: SN  If patient discharges after hours, please call [phone].   Florene Glen 01/06/2018, 10:40 AM

## 2018-01-06 NOTE — Progress Notes (Signed)
Unable to wean off Levo at this time. Tube feeding at goal 55 ml/hr. Patient tolerating. Unable to do WUA, patient too combative and does not follow commands when sedation turned down. Continue to monitor.

## 2018-01-06 NOTE — Progress Notes (Signed)
Kim Buchanan at [place] NAME: Kim Buchanan    MR#:  [id number]  DATE OF BIRTH:  Feb 19, 1951  SUBJECTIVE:  CHIEF COMPLAINT:   Chief Complaint  Patient presents with  . Altered Mental Status  Case discussed with intensivist, patient continues to be vent dependent, on pressors  REVIEW OF SYSTEMS:  CONSTITUTIONAL: No fever, fatigue or weakness.  EYES: No blurred or double vision.  EARS, NOSE, AND THROAT: No tinnitus or ear pain.  RESPIRATORY: No cough, shortness of breath, wheezing or hemoptysis.  CARDIOVASCULAR: No chest pain, orthopnea, edema.  GASTROINTESTINAL: No nausea, vomiting, diarrhea or abdominal pain.  GENITOURINARY: No dysuria, hematuria.  ENDOCRINE: No polyuria, nocturia,  HEMATOLOGY: No anemia, easy bruising or bleeding SKIN: No rash or lesion. MUSCULOSKELETAL: No joint pain or arthritis.   NEUROLOGIC: No tingling, numbness, weakness.  PSYCHIATRY: No anxiety or depression.   ROS  DRUG ALLERGIES:  No Known Allergies  VITALS:  Blood pressure (!) 101/55, pulse (!) 113, temperature (!) 101.4 F (38.6 C), temperature source Oral, resp. rate 20, height 5\' 5"  (1.651 m), weight 83.1 kg (183 lb 3.2 oz), SpO2 92 %.  PHYSICAL EXAMINATION:  GENERAL:  67 y.o.-year-old patient lying in the bed with no acute distress.  EYES: Pupils equal, round, reactive to light and accommodation. No scleral icterus. Extraocular muscles intact.  HEENT: Head atraumatic, normocephalic. Oropharynx and nasopharynx clear.  NECK:  Supple, no jugular venous distention. No thyroid enlargement, no tenderness.  LUNGS: Normal breath sounds bilaterally, no wheezing, rales,rhonchi or crepitation. No use of accessory muscles of respiration.  CARDIOVASCULAR: S1, S2 normal. No murmurs, rubs, or gallops.  ABDOMEN: Soft, nontender, nondistended. Bowel sounds present. No organomegaly or mass.  EXTREMITIES: No pedal edema, cyanosis, or clubbing.  NEUROLOGIC: Cranial  nerves II through XII are intact. Muscle strength 5/5 in all extremities. Sensation intact. Gait not checked.  PSYCHIATRIC: The patient is alert and oriented x 3.  SKIN: No obvious rash, lesion, or ulcer.   Physical Exam LABORATORY PANEL:   CBC Recent Labs  Lab 01/05/18 0335  WBC 7.4  HGB 11.2*  HCT 33.2*  PLT 196   ------------------------------------------------------------------------------------------------------------------  Chemistries  Recent Labs  Lab 01/04/18 0018  01/05/18 0335 01/06/18 0422  NA 142  --  145  --   K 4.5  --  3.8  --   CL 108  --  117*  --   CO2 21*  --  22  --   GLUCOSE 144*  --  131*  --   BUN 18  --  8  --   CREATININE 0.86   < > 0.67  --   CALCIUM 9.2  --  7.5*  --   MG  --   --   --  1.7  AST 32  --   --   --   ALT 35  --   --   --   ALKPHOS 95  --   --   --   BILITOT 0.4  --   --   --    < > = values in this interval not displayed.   ------------------------------------------------------------------------------------------------------------------  Cardiac Enzymes Recent Labs  Lab 01/04/18 0947 01/04/18 1522  TROPONINI <0.03 <0.03   ------------------------------------------------------------------------------------------------------------------  RADIOLOGY:  Dg Chest 1 View  Result Date: 01/05/2018 CLINICAL DATA:  Status post PICC line placement. EXAM: CHEST  1 VIEW COMPARISON:  Chest radiograph 01/05/2018. FINDINGS: ET tube terminates in the mid trachea. Enteric tube courses inferior  to the diaphragm. Right upper extremity PICC line tip projects over the superior vena cava. Monitoring leads overlie the patient. Patient is rotated to the right. Low lung volumes. Stable cardiac and mediastinal contours. Bibasilar airspace opacities. No pleural effusion or pneumothorax. IMPRESSION: Low lung volumes with basilar atelectasis. Right upper extremity PICC line tip projects over the superior vena cava. Electronically Signed   By: Lovey Curling  M.D.   On: 01/05/2018 12:36   Dg Chest Port 1 View  Result Date: 01/05/2018 CLINICAL DATA:  Smoker, code sepsis initiated, CT head negative, chest x-ray negative, patient evaluated in the emergency room, resting comfortably intubated on propofol drip, patient is now being admitted for acute hypoxic respiratory failure,.*comment was truncated* EXAM: PORTABLE CHEST 1 VIEW COMPARISON:  01/04/2018 FINDINGS: Endotracheal tube and NG tube good position. LEFT basilar atelectasis is increased. Upper lobes are clear. Low lung volumes. IMPRESSION: 1. Increased LEFT lower lobe atelectasis. 2. Endotracheal tube and NG tube appear in good position. Electronically Signed   By: Suzy Bouchard M.D.   On: 01/05/2018 11:29    ASSESSMENT AND PLAN:  67 yo female admitted with acute encephalopathy likely secondary to accidental vs. unintentional drug overdose requiring mechanical intubation for airway protection  1Acute respiratory failure with hypoxia Stable Most likely secondary to recurrent probable drug overdose-dischargedlast month for similar presentation  Continue ventilator with weaning as tolerated  Urine drug screen noted for benzodiazepines and tricyclics  2acute toxic metabolic encephalopathy Most likely secondary to acute recurrent drug overdose and acute hypoxic respiratory failure Continue to holdpsychotropic meds, mechanical ventilation with weaning as tolerated,ammonia level normal, RPR nonreactive, neuro checks per routine,and continueIV fluids for rehydration  3chronic GERD Stable PPI daily  4chronic depression Past medical history noted also for PTSD/panic attacks consider psychiatryconsultation once medically stable  5acute possible sepsis Ruled out  6 acute shock Continue pressor support with weaning as tolerated    All the records are reviewed and case discussed with Care Management/Social Workerr. Management plans discussed with the patient, family and  they are in agreement.  CODE STATUS: full  TOTAL TIME TAKING CARE OF THIS PATIENT: 35 minutes.     POSSIBLE D/C IN 3-5 DAYS, DEPENDING ON CLINICAL CONDITION.   Kim Buchanan M.D on 01/06/2018   Between 7am to 6pm - Pager - [phone]  After 6pm go to [email or web address] - password [email or web address] [facility]  Office  [phone]  CC: Primary care physician; Mikey College, NP  Note: This dictation was prepared with Dragon dictation along with smaller phrase technology. Any transcriptional errors that result from this process are unintentional.

## 2018-01-07 ENCOUNTER — Inpatient Hospital Stay: Payer: Medicare HMO

## 2018-01-07 DIAGNOSIS — R410 Disorientation, unspecified: Secondary | ICD-10-CM

## 2018-01-07 LAB — COMPREHENSIVE METABOLIC PANEL
ALT: 137 U/L — ABNORMAL HIGH (ref 14–54)
AST: 175 U/L — ABNORMAL HIGH (ref 15–41)
Albumin: 2.8 g/dL — ABNORMAL LOW (ref 3.5–5.0)
Alkaline Phosphatase: 108 U/L (ref 38–126)
Anion gap: 7 (ref 5–15)
BUN: 13 mg/dL (ref 6–20)
CO2: 26 mmol/L (ref 22–32)
Calcium: 7.9 mg/dL — ABNORMAL LOW (ref 8.9–10.3)
Chloride: 109 mmol/L (ref 101–111)
Creatinine, Ser: 0.55 mg/dL (ref 0.44–1.00)
GFR calc Af Amer: >60 mL/min (ref >60)
GFR calc non Af Amer: >60 mL/min (ref >60)
Glucose, Bld: 122 mg/dL — ABNORMAL HIGH (ref 65–99)
Potassium: 3.8 mmol/L (ref 3.5–5.1)
Sodium: 142 mmol/L (ref 135–145)
Total Bilirubin: 0.8 mg/dL (ref 0.3–1.2)
Total Protein: 5.8 g/dL — ABNORMAL LOW (ref 6.5–8.1)

## 2018-01-07 LAB — CBC
HCT: 30.5 % — ABNORMAL LOW (ref 35.0–47.0)
Hemoglobin: 10.4 g/dL — ABNORMAL LOW (ref 12.0–16.0)
MCH: 30.7 pg (ref 26.0–34.0)
MCHC: 34 g/dL (ref 32.0–36.0)
MCV: 90.4 fL (ref 80.0–100.0)
Platelets: 176 10*3/uL (ref 150–440)
RBC: 3.37 MIL/uL — ABNORMAL LOW (ref 3.80–5.20)
RDW: 15.2 % — ABNORMAL HIGH (ref 11.5–14.5)
WBC: 5.8 10*3/uL (ref 3.6–11.0)

## 2018-01-07 LAB — PROCALCITONIN: Procalcitonin: 0.38 ng/mL

## 2018-01-07 LAB — GLUCOSE, CAPILLARY
Glucose-Capillary: 117 mg/dL — ABNORMAL HIGH (ref 65–99)
Glucose-Capillary: 105 mg/dL — ABNORMAL HIGH (ref 65–99)
Glucose-Capillary: 126 mg/dL — ABNORMAL HIGH (ref 65–99)

## 2018-01-07 LAB — TRIGLYCERIDES: Triglycerides: 72 mg/dL (ref <150)

## 2018-01-07 LAB — HIV ANTIBODY (ROUTINE TESTING W REFLEX): HIV Screen 4th Generation wRfx: NONREACTIVE

## 2018-01-07 MED ORDER — ATORVASTATIN CALCIUM 20 MG PO TABS: 20.0000 mg | ORAL_TABLET | Freq: Every day | ORAL | Status: DC

## 2018-01-07 MED ORDER — ORAL CARE MOUTH RINSE
15.0000 mL | Freq: Two times a day (BID) | OROMUCOSAL | Status: DC
  Administered 2018-01-07 – 2018-01-09 (×2): 15 mL via OROMUCOSAL

## 2018-01-07 MED ORDER — KCL IN DEXTROSE-NACL 20-5-0.45 MEQ/L-%-% IV SOLN
INTRAVENOUS | Status: DC
  Administered 2018-01-07 – 2018-01-08 (×2): via INTRAVENOUS
  Filled 2018-01-07 (×3): qty 1000

## 2018-01-07 MED ORDER — ACETAMINOPHEN 325 MG PO TABS: 650.0000 mg | ORAL_TABLET | ORAL | Status: DC | PRN

## 2018-01-07 MED ORDER — DEXMEDETOMIDINE HCL IN NACL 400 MCG/100ML IV SOLN
0.0000 ug/kg/h | INTRAVENOUS | Status: DC
  Administered 2018-01-07: 0.4 ug/kg/h via INTRAVENOUS
  Filled 2018-01-07: qty 100

## 2018-01-07 MED ORDER — ENOXAPARIN SODIUM 40 MG/0.4ML ~~LOC~~ SOLN
40.0000 mg | SUBCUTANEOUS | Status: DC
  Administered 2018-01-07 – 2018-01-09 (×3): 40 mg via SUBCUTANEOUS
  Filled 2018-01-07 (×3): qty 0.4

## 2018-01-07 MED ORDER — ASPIRIN 81 MG PO CHEW: 81.0000 mg | CHEWABLE_TABLET | Freq: Every day | ORAL | Status: DC

## 2018-01-07 MED ORDER — LORAZEPAM 2 MG/ML IJ SOLN: 0.5000 mg | INTRAMUSCULAR | Status: DC | PRN

## 2018-01-07 NOTE — Progress Notes (Signed)
PULMONARY / CRITICAL CARE MEDICINE   Name: Kim Buchanan MRN: [id number] DOB: 04/01/1951    ADMISSION DATE:  01/04/2018  PT PROFILE: 39 F smoker admitted via EMS/ED with acute alteration in MS and intubated in ED for airway protection. UDS + for benzo's and TCA (both are prescribed meds)  MAJOR EVENTS/TEST RESULTS: 03/16 CT head: No acute findings  INDWELLING DEVICES:: ETT 03/16 >> 03/19 RUE PICC 03/17 >>   MICRO DATA: MRSA PCR 03/16 >> NEG Blood 03/16 >> NEG  ANTIMICROBIALS:  Vanc 03/16 >> 03/16 Pip-tazo 03/16 >> 03/16 Cefepime 03/16 >> 03/16  Amp-sulbactam 03/16 >> 03/17     SUBJECTIVE:  On initial evaluation this morning, minimally responsive on fentanyl and propofol infusions.  These were discontinued.  She became extremely agitated.  Sedative infusions were resumed.  They were again decreased and she was able to interact.  She passed a SBT and was extubated.  Appears to be tolerating initially  VITAL SIGNS: BP (!) 132/56   Pulse 84   Temp 100 F (37.8 C) (Axillary)   Resp 20   Ht 5\' 5"  (1.651 m)   Wt 83.8 kg (184 lb 11.9 oz)   SpO2 98%   BMI 30.74 kg/m   HEMODYNAMICS:    VENTILATOR SETTINGS: Vent Mode: PSV FiO2 (%):  [35 %] 35 % Set Rate:  [20 bmp] 20 bmp Vt Set:  [450 mL] 450 mL PEEP:  [5 cmH20] 5 cmH20 Pressure Support:  [5 cmH20] 5 cmH20 Plateau Pressure:  [17 cmH20-22 cmH20] 19 cmH20  INTAKE / OUTPUT: I/O last 3 completed shifts: In: 5551.8 [I.V.:3927.5; NG/GT:1624.2] Out: 4710 [Urine:4710]  PHYSICAL EXAMINATION: General: Intermittently agitated, follows commands, conversant Neuro: PERRLA, EOMI, moves all extremities HEENT: NCAT, sclerae white Cardiovascular: tachy, regular, no M Lungs: Improved rhonchi, no wheezes Abdomen: Soft, nondistended, bowel sounds present Extremities: Warm, no edema Skin: No lesions noted  LABS:  BMET Recent Labs  Lab 01/04/18 0018 01/04/18 0407 01/05/18 0335 01/07/18 0350  NA 142  --  145 142  K 4.5   --  3.8 3.8  CL 108  --  117* 109  CO2 21*  --  22 26  BUN 18  --  8 13  CREATININE 0.86 0.82 0.67 0.55  GLUCOSE 144*  --  131* 122*    Electrolytes Recent Labs  Lab 01/04/18 0018 01/05/18 0335 01/06/18 0422 01/07/18 0350  CALCIUM 9.2 7.5*  --  7.9*  MG  --   --  1.7  --   PHOS  --   --  4.1  --     CBC Recent Labs  Lab 01/04/18 0407 01/05/18 0335 01/07/18 0350  WBC 10.5 7.4 5.8  HGB 12.0 11.2* 10.4*  HCT 36.2 33.2* 30.5*  PLT 203 196 176    Coag's Recent Labs  Lab 01/04/18 0018 01/04/18 0407  APTT  --  24  INR 0.89 0.96    Sepsis Markers Recent Labs  Lab 01/04/18 0018 01/04/18 0200  01/05/18 0335 01/06/18 0422 01/07/18 0350  LATICACIDVEN 3.3* 1.6  --   --   --   --   PROCALCITON <0.10  --    < > <0.10 <0.10 0.38   < > = values in this interval not displayed.    ABG Recent Labs  Lab 01/04/18 0600 01/05/18 0447 01/06/18 0500  PHART 7.31* 7.41 7.34*  PCO2ART 48 37 45  PO2ART 115* 108 86    Liver Enzymes Recent Labs  Lab 01/01/18 0000 01/04/18 0018  01/07/18 0350  AST 21 32 175*  ALT 38* 35 137*  ALKPHOS  --  95 108  BILITOT 0.4 0.4 0.8  ALBUMIN  --  4.1 2.8*    Cardiac Enzymes Recent Labs  Lab 01/04/18 0407 01/04/18 0947 01/04/18 1522  TROPONINI <0.03 <0.03 <0.03    Glucose Recent Labs  Lab 01/06/18 1620 01/06/18 2013 01/06/18 2347 01/07/18 0346 01/07/18 0742 01/07/18 1159  GLUCAP 111* 96 129* 117* 105* 126*    CXR 03/19: NSC low volumes and bibasilar atelectasis   ASSESSMENT / PLAN:  PULMONARY A: Acute respiratory failure - initially intubated for AMS Smoker with suspected COPD Bronchospasm, resolved Obstructive sleep apnea P:   Extubated under my direct supervision today Monitor in ICU post extubation Supplemental oxygen to maintain SPO2 >90% Continue nebulized steroids and bronchodilators   CARDIOVASCULAR A:  Hypotension, unclear etiology -improving P:  Continue hemodynamic monitoring Wean  norepinephrine maintaining MAP >65 mmHg  RENAL A:   Hypernatremia, improving P:   Monitor BMET intermittently Monitor I/Os Correct electrolytes as indicated IVF's initiated post extubation  GASTROINTESTINAL A:   Post extubation dysphagia P:   SUP: Not indicated post extubation Consider SLP evaluation 03/20  HEMATOLOGIC A:   Mild anemia without acute blood loss P:  DVT px: SQ heparin Monitor CBC intermittently Transfuse per usual guidelines   INFECTIOUS A:   Minimally elevated PCT No definite infections identified P:   Monitor temp, WBC count Micro and abx as above  Monitoring off antibiotics  ENDOCRINE A:   Very mild hyperglycemia without documented history of DM P:   Discontinue SSI protocol  NEUROLOGIC A:   Acute encephalopathy  Agitated delirium post extubation P:   RASS goal: 0, -1 Dexmedetomidine infusion ordered as needed for agitated delirium Low-dose lorazepam as needed   FAMILY  - Updates: Husband updated at bedside   CCM time: 45 mins The above time includes time spent in consultation with patient and/or family members and reviewing care plan on multidisciplinary rounds  Merton Border, MD PCCM service Mobile [phone] Pager [phone]    01/07/2018, 4:22 PM

## 2018-01-07 NOTE — Progress Notes (Signed)
[place] [facility] [place] NAME: Kim Buchanan    MR#:  [id number]  DATE OF BIRTH:  05-15-1951  SUBJECTIVE:  CHIEF COMPLAINT:   Chief Complaint  Patient presents with  . Altered Mental Status  Patient remains critically ill, vent dependent, on pressors  REVIEW OF SYSTEMS:  CONSTITUTIONAL: No fever, fatigue or weakness.  EYES: No blurred or double vision.  EARS, NOSE, AND THROAT: No tinnitus or ear pain.  RESPIRATORY: No cough, shortness of breath, wheezing or hemoptysis.  CARDIOVASCULAR: No chest pain, orthopnea, edema.  GASTROINTESTINAL: No nausea, vomiting, diarrhea or abdominal pain.  GENITOURINARY: No dysuria, hematuria.  ENDOCRINE: No polyuria, nocturia,  HEMATOLOGY: No anemia, easy bruising or bleeding SKIN: No rash or lesion. MUSCULOSKELETAL: No joint pain or arthritis.   NEUROLOGIC: No tingling, numbness, weakness.  PSYCHIATRY: No anxiety or depression.   ROS  DRUG ALLERGIES:  No Known Allergies  VITALS:  Blood pressure (!) 132/56, pulse 84, temperature 100 F (37.8 C), temperature source Axillary, resp. rate 20, height 5\' 5"  (1.651 m), weight 83.8 kg (184 lb 11.9 oz), SpO2 95 %.  PHYSICAL EXAMINATION:  GENERAL:  67 y.o.-year-old patient lying in the bed with no acute distress.  EYES: Pupils equal, round, reactive to light and accommodation. No scleral icterus. Extraocular muscles intact.  HEENT: Head atraumatic, normocephalic. Oropharynx and nasopharynx clear.  NECK:  Supple, no jugular venous distention. No thyroid enlargement, no tenderness.  LUNGS: Normal breath sounds bilaterally, no wheezing, rales,rhonchi or crepitation. No use of accessory muscles of respiration.  CARDIOVASCULAR: S1, S2 normal. No murmurs, rubs, or gallops.  ABDOMEN: Soft, nontender, nondistended. Bowel sounds present. No organomegaly or mass.  EXTREMITIES: No pedal edema, cyanosis, or clubbing.  NEUROLOGIC: Cranial nerves II through XII are intact.  Muscle strength 5/5 in all extremities. Sensation intact. Gait not checked.  PSYCHIATRIC: The patient is alert and oriented x 3.  SKIN: No obvious rash, lesion, or ulcer.   Physical Exam LABORATORY PANEL:   CBC Recent Labs  Lab 01/07/18 0350  WBC 5.8  HGB 10.4*  HCT 30.5*  PLT 176   ------------------------------------------------------------------------------------------------------------------  Chemistries  Recent Labs  Lab 01/06/18 0422 01/07/18 0350  NA  --  142  K  --  3.8  CL  --  109  CO2  --  26  GLUCOSE  --  122*  BUN  --  13  CREATININE  --  0.55  CALCIUM  --  7.9*  MG 1.7  --   AST  --  175*  ALT  --  137*  ALKPHOS  --  108  BILITOT  --  0.8   ------------------------------------------------------------------------------------------------------------------  Cardiac Enzymes Recent Labs  Lab 01/04/18 0947 01/04/18 1522  TROPONINI <0.03 <0.03   ------------------------------------------------------------------------------------------------------------------  RADIOLOGY:  Dg Chest Port 1 View  Result Date: 01/07/2018 CLINICAL DATA:  Respiratory failure. EXAM: PORTABLE CHEST 1 VIEW COMPARISON:  01/05/2018. FINDINGS: Endotracheal tube, NG tube, right PICC line in stable position. Heart size stable. Low lung volumes with mild bibasilar atelectasis. No pleural effusion or pneumothorax. Thoracic spine scoliosis and degenerative change. IMPRESSION: 1.  Lines and tubes in stable position. 2.  Low lung volumes with mild bibasilar atelectasis. Electronically Signed   By: Marcello Moores  Register   On: 01/07/2018 06:35    ASSESSMENT AND PLAN:  67 yo female admitted with acute encephalopathy likely secondary to accidental vs. unintentional drug overdose requiring mechanical intubation for airway protection  1Acute respiratory failure with hypoxia Stable Most  likely secondary to recurrent probable drug overdose-dischargedlast month for similar presentation  Continue  ventilator with weaning as tolerated  Urine drug screen noted for benzodiazepines and tricyclics  2acute toxic metabolic encephalopathy Most likely secondary to acute recurrent drug overdose and acute hypoxic respiratory failure Continue to holdpsychotropic meds, mechanical ventilation with weaning as tolerated,ammonia level normal, RPR nonreactive, neuro checks per routine,and continueIV fluids for rehydration  3chronic GERD Stable PPI daily  4chronic depression Past medical history noted also for PTSD/panic attacks consider psychiatryconsultation once medically stable  5acute possiblesepsis Ruled out  6 acute shock Etiology unknown Continue norepinephrine protocol   All the records are reviewed and case discussed with Care Management/Social Workerr. Management plans discussed with the patient, family and they are in agreement.  CODE STATUS: full  TOTAL TIME TAKING CARE OF THIS PATIENT: 35 minutes.     POSSIBLE D/C IN 3-5 DAYS, DEPENDING ON CLINICAL CONDITION.   Avel Peace Dicie Edelen M.D on 01/07/2018   Between 7am to 6pm - Pager - [phone]  After 6pm go to [email or web address] - password [email or web address] [facility]  Office  [phone]  CC: Primary care physician; Mikey College, NP  Note: This dictation was prepared with Dragon dictation along with smaller phrase technology. Any transcriptional errors that result from this process are unintentional.

## 2018-01-07 NOTE — Progress Notes (Signed)
Pt extubated without complications, no stridor noted, respiratory rate 28/min, sats 95% on 2lpm [place], will continue to monitor

## 2018-01-08 LAB — PROCALCITONIN: Procalcitonin: 0.25 ng/mL

## 2018-01-08 MED ORDER — SODIUM CHLORIDE 0.9% FLUSH
10.0000 mL | INTRAVENOUS | Status: DC | PRN
  Administered 2018-01-09: 10 mL
  Filled 2018-01-08: qty 40

## 2018-01-08 MED ORDER — VENLAFAXINE HCL ER 150 MG PO CP24
150.0000 mg | ORAL_CAPSULE | Freq: Every day | ORAL | Status: DC
  Administered 2018-01-08 – 2018-01-09 (×2): 150 mg via ORAL
  Filled 2018-01-08: qty 2
  Filled 2018-01-08 (×2): qty 1

## 2018-01-08 MED ORDER — ISOSORBIDE MONONITRATE ER 30 MG PO TB24
30.0000 mg | ORAL_TABLET | Freq: Every day | ORAL | Status: DC
  Administered 2018-01-08 – 2018-01-10 (×3): 30 mg via ORAL
  Filled 2018-01-08 (×2): qty 1

## 2018-01-08 MED ORDER — CLONAZEPAM 0.5 MG PO TABS: 0.5000 mg | ORAL_TABLET | Freq: Two times a day (BID) | ORAL | Status: DC | PRN

## 2018-01-08 MED ORDER — ASPIRIN 81 MG PO CHEW
81.0000 mg | CHEWABLE_TABLET | Freq: Every day | ORAL | Status: DC
  Administered 2018-01-09 – 2018-01-10 (×2): 81 mg via ORAL
  Filled 2018-01-08: qty 1

## 2018-01-08 MED ORDER — AMITRIPTYLINE HCL 25 MG PO TABS
25.0000 mg | ORAL_TABLET | Freq: Every day | ORAL | Status: DC
  Administered 2018-01-08 – 2018-01-09 (×2): 25 mg via ORAL
  Filled 2018-01-08 (×2): qty 1

## 2018-01-08 MED ORDER — ALBUTEROL SULFATE (2.5 MG/3ML) 0.083% IN NEBU: 2.5000 mg | INHALATION_SOLUTION | RESPIRATORY_TRACT | Status: DC | PRN

## 2018-01-08 MED ORDER — ISOSORBIDE DINITRATE 30 MG PO TABS: 30.0000 mg | ORAL_TABLET | Freq: Every day | ORAL | Status: DC

## 2018-01-08 MED ORDER — ASPIRIN 81 MG PO CHEW
81.0000 mg | CHEWABLE_TABLET | Freq: Every day | ORAL | Status: DC
  Administered 2018-01-08: 81 mg via NASOGASTRIC
  Filled 2018-01-08: qty 1

## 2018-01-08 MED ORDER — LORAZEPAM 2 MG/ML IJ SOLN: 0.5000 mg | INTRAMUSCULAR | Status: DC | PRN

## 2018-01-08 MED ORDER — PANTOPRAZOLE SODIUM 40 MG PO TBEC
40.0000 mg | DELAYED_RELEASE_TABLET | Freq: Every day | ORAL | Status: DC
  Administered 2018-01-08 – 2018-01-10 (×3): 40 mg via ORAL
  Filled 2018-01-08 (×2): qty 1

## 2018-01-08 MED ORDER — ATORVASTATIN CALCIUM 20 MG PO TABS
20.0000 mg | ORAL_TABLET | Freq: Every day | ORAL | Status: DC
  Administered 2018-01-08 – 2018-01-09 (×2): 20 mg via ORAL
  Filled 2018-01-08 (×2): qty 1

## 2018-01-08 MED ORDER — VENLAFAXINE HCL ER 75 MG PO CP24
75.0000 mg | ORAL_CAPSULE | Freq: Every day | ORAL | Status: DC
  Administered 2018-01-08 – 2018-01-09 (×2): 75 mg via ORAL
  Filled 2018-01-08 (×3): qty 1

## 2018-01-08 MED ORDER — IPRATROPIUM-ALBUTEROL 0.5-2.5 (3) MG/3ML IN SOLN: 3.0000 mL | Freq: Four times a day (QID) | RESPIRATORY_TRACT | Status: DC | PRN

## 2018-01-08 MED ORDER — MOMETASONE FURO-FORMOTEROL FUM 200-5 MCG/ACT IN AERO
2.0000 | INHALATION_SPRAY | Freq: Two times a day (BID) | RESPIRATORY_TRACT | Status: DC
  Administered 2018-01-08 – 2018-01-10 (×4): 2 via RESPIRATORY_TRACT
  Filled 2018-01-08 (×2): qty 8.8

## 2018-01-08 NOTE — Evaluation (Addendum)
Clinical/Bedside Swallow Evaluation Patient Details  Name: Kim Buchanan MRN: [id number] Date of Birth: 14-[name]-1951  Today's Date: 01/08/2018 Time: SLP Start Time (ACUTE ONLY): 1215 SLP Stop Time (ACUTE ONLY): 1315 SLP Time Calculation (min) (ACUTE ONLY): 60 min  Past Medical History:  Past Medical History:  Diagnosis Date  . Anxiety   . Arthritis    lower back  . Depression   . GERD (gastroesophageal reflux disease)   . Hyperlipidemia   . Leaky heart valve    sees Dr Humphrey Rolls  . Panic attack   . PTSD (post-traumatic stress disorder)   . Rectal fistula   . Sleep apnea   . Wears dentures    full upper   Past Surgical History:  Past Surgical History:  Procedure Laterality Date  . ABDOMINAL HYSTERECTOMY  10/2015  . COLONOSCOPY  2014  . COLONOSCOPY WITH PROPOFOL N/A 10/01/2016   Procedure: COLONOSCOPY WITH PROPOFOL;  Surgeon: Lucilla Lame, MD;  Location: [facility];  Service: Endoscopy;  Laterality: N/A;  . CYSTOCELE REPAIR N/A 12/17/2016   Procedure: ANTERIOR REPAIR (CYSTOCELE);  Surgeon: Brayton Mars, MD;  Location: [facility];  Service: Gynecology;  Laterality: N/A;  . POLYPECTOMY  10/01/2016   Procedure: POLYPECTOMY;  Surgeon: Lucilla Lame, MD;  Location: [facility];  Service: Endoscopy;;  . RECTAL SURGERY  06/28/2015   Rectal prolapse, laparoscopic rectopexy the coldCharmont, MD; [facility]  . TUBAL LIGATION    . VAGINAL HYSTERECTOMY Bilateral 12/17/2016   Procedure: HYSTERECTOMY VAGINAL WITH BILATERAL SALPINGO OOPHERECTOMY;  Surgeon: Brayton Mars, MD;  Location: [facility];  Service: Gynecology;  Laterality: Bilateral;   HPI:  Pt is a 67 y.o. female with a known history of GERD, panic attack, PTSD, depression/anxiety, sleep apnea which also includes drug overdoses requiring intubation, was discharged last month for similar presentation for drug overdose, brought to the emergency room for acute altered mental status, patient was combative in route to the  hospital, given 1 mg IM Versed, in the emergency room patient was obtunded, drooling, emergently intubated for airway protection, patient noted to be tachycardic with heart rate of 109, white count of 13,000 with lactic acid 3.3, code sepsis initiated, CT head negative, chest x-ray negative, patient evaluated in the emergency room, resting comfortably intubated on propofol drip, patient is now being admitted for acute hypoxic respiratory failure. CXR revealed Low lung volumes with basilar atelectasis.    Assessment / Plan / Recommendation Clinical Impression  Pt appears to present w/ adequate oropharyngeal phase swallow function w/ no gross s/s oral or pharyngeal phase deficits; no overt s/s of aspiration noted w/ oral intake. Pt consumed items sips of thin liquids via Cup and small tsps of puree then softened solids w/ no overt s/s of aspiration noted; no coughing or wet vocal quality noted and no decline in respiratory status noted during/post trials. O2 sats remained 97-98%; no significant increase in RR during po trials. Pt exhibited min slower oral phase time w/ the increased textured trials of mech soft foods but when moistened well and cut small, she masticated/mashed boluses for swallowing/clearing. Oral clearing post trials was appropriate. Pt does not wear Upper denture plate, has few lower native dentition. Oral Motor movements appeared [facility] w/ no unilateral weakness noted or loss of bolus. Suspect pt's baseline deconditioned status could impact her overall stamina during meals and rest breaks were recommended. Recommend a Dysphagia level 3(Mech Soft) diet w/ Thin liquids via Cup; general aspiration precautions; assistance w/ setup at meals. Recommend Pills  in Puree - Whole as needed for safer, easier swallowing. Recommend f/u w/ Dietician for nutritional supplements as warranted. No further skilled ST services indicated at this time as pt appears at her baseline w/ re: for toleration of an oral diet.  NSG to reconsult if any decline in status while admitted.  SLP Visit Diagnosis: Dysphagia, unspecified (R13.10)    Aspiration Risk  (reduced following precautions)    Diet Recommendation  Dysphagia level 3 (Mech Soft foods moistened) w/ Thin liquids; general aspiration precautions. Setup assistance at meals.   Medication Administration: Whole meds with puree(for safer, easier swallowing)    Other  Recommendations Recommended Consults: (Dietician f/u) Oral Care Recommendations: Oral care BID;Staff/trained caregiver to provide oral care;Patient independent with oral care Other Recommendations: (n/a)   Follow up Recommendations None      Frequency and Duration (n/a)  (n/a)       Prognosis Prognosis for Safe Diet Advancement: Good Barriers to Reach Goals: (deconditioned)      Swallow Study   General Date of Onset: 01/04/18 HPI: Pt is a 67 y.o. female with a known history of GERD, panic attack, PTSD, depression/anxiety, sleep apnea which also includes drug overdoses requiring intubation, was discharged last month for similar presentation for drug overdose, brought to the emergency room for acute altered mental status, patient was combative in route to the hospital, given 1 mg IM Versed, in the emergency room patient was obtunded, drooling, emergently intubated for airway protection, patient noted to be tachycardic with heart rate of 109, white count of 13,000 with lactic acid 3.3, code sepsis initiated, CT head negative, chest x-ray negative, patient evaluated in the emergency room, resting comfortably intubated on propofol drip, patient is now being admitted for acute hypoxic respiratory failure. CXR revealed Low lung volumes with basilar atelectasis.  Type of Study: Bedside Swallow Evaluation Previous Swallow Assessment: during a previous admission Diet Prior to this Study: NPO(d/t recent oral intubation) Temperature Spikes Noted: (wbc 5.8;  temp 99.1) Respiratory Status: Nasal  cannula(2 liters) History of Recent Intubation: Yes Length of Intubations (days): 4 days Date extubated: 01/07/18 Behavior/Cognition: Alert;Cooperative;Pleasant mood;Distractible;Requires cueing Oral Cavity Assessment: Within Functional Limits Oral Care Completed by SLP: Recent completion by staff Oral Cavity - Dentition: (native bottom dentition; NO upper dentition or plate placed) Vision: Functional for self-feeding Self-Feeding Abilities: Able to feed self;Needs assist;Needs set up Patient Positioning: Upright in bed Baseline Vocal Quality: Normal Volitional Cough: Congested;Strong Volitional Swallow: Able to elicit    Oral/Motor/Sensory Function Overall Oral Motor/Sensory Function: Within functional limits   Ice Chips Ice chips: Within functional limits Presentation: Spoon(fed; 3 trials)   Thin Liquid Thin Liquid: Within functional limits Presentation: Cup;Self Fed(~4-5 ozs)    Nectar Thick Nectar Thick Liquid: Not tested   Honey Thick Honey Thick Liquid: Not tested   Puree Puree: Within functional limits Presentation: Self Fed;Spoon(4 ozs)   Solid   GO   Solid: Impaired(broken down and moistened) Presentation: Self Fed;Spoon(6-7 trials) Oral Phase Impairments: Impaired mastication(lacking dentition) Oral Phase Functional Implications: Impaired mastication;Prolonged oral transit(lacking dentition) Pharyngeal Phase Impairments: (none)        Orinda Kenner, MS, CCC-SLP Colburn Asper 01/08/2018,5:01 PM

## 2018-01-08 NOTE — Progress Notes (Signed)
Nutrition Follow-up  DOCUMENTATION CODES:   Not applicable  INTERVENTION:  Patient refuses any oral nutrition supplements to help meet calorie and protein needs.  Encouraged adequate intake of protein and calories with meals to prevent any unintentional weight loss or loss of lean body mass.  Recommend providing bowel regimen for patient. She had a stool-filled colon on admission and has not had a bowel movement, even after receiving tube feeds for a few days. She is likely constipated at baseline.  NUTRITION DIAGNOSIS:   Inadequate oral intake related to inability to eat as evidenced by NPO status.  Resolving - patient has been advanced to dysphagia 3 diet with thin liquids and is hungry.  GOAL:   Provide needs based on ASPEN/SCCM guidelines  Progressing.  MONITOR:   Vent status, Labs, Weight trends, TF tolerance, I & O's  REASON FOR ASSESSMENT:   Ventilator    ASSESSMENT:   67 year old female with PMHx of anxiety, depression, panic attacks, PTSD, polysubstance abuse, GERD, HLD, leaky heart valve, hx of rectal fistula, arthritis, sleep apnea who is admitted with acute respiratory failure and acute encephalopathy likely secondary to accidental drug overdose requiring intubation 3/16. Of note, patient was admitted 1/23-2/1 also for accidental drug overdose and required mechanical intubation after which she discharge home with home health.   -Patient was extubated 3/19. OGT was removed. -Following SLP evaluation today she was advanced to dysphagia 3 diet with thin liquids.  Met with patient and husband at bedside. She reports she is very hungry. She just placed her lunch order and is excited to eat today. She had a good appetite PTA. She reports she was weight-stable PTA. RD offered high-protein ONS to help meet calorie/protein needs but patient refuses. She says she will be able to eat enough of her meals and she won't need one.  Medications reviewed and include:  pantoprazole.  Labs reviewed: CBG 96-129.  I/O: 4800 mL UOP yesterday (2.4 mL/kg/hr)  Discussed with RN and on rounds.  Diet Order:  DIET DYS 3 Room service appropriate? Yes with Assist; Fluid consistency: Thin Aspiration precautions  EDUCATION NEEDS:   No education needs have been identified at this time  Skin:  Skin Assessment: Reviewed RN Assessment  Last BM:  PTA (01/03/2018 per chart)  Height:   Ht Readings from Last 1 Encounters:  01/04/18 _0  (1.651 m)    Weight:   Wt Readings from Last 1 Encounters:  01/07/18 184 lb 11.9 oz (83.8 kg)    Ideal Body Weight:  56.8 kg  BMI:  Body mass index is 30.74 kg/m.  Estimated Nutritional Needs:   Kcal:  1630-1900 (MSJ x 1.2-1.4)  Protein:  96-120 grams (1.2-1.5 grams/kg)  Fluid:  1.7-2 L/day (30-35 mL/kg IBW)  Willey Blade, MS, RD, LDN Office: [phone] Pager: [phone] After Hours/Weekend Pager: [phone]

## 2018-01-08 NOTE — Progress Notes (Signed)
PULMONARY / CRITICAL CARE MEDICINE   Name: Kim Buchanan MRN: [id number] DOB: 24-Jan-1951    ADMISSION DATE:  01/04/2018  PT PROFILE: 48 F smoker admitted via EMS/ED with acute alteration in MS and intubated in ED for airway protection. UDS + for benzo's and TCA (both are prescribed meds)  MAJOR EVENTS/TEST RESULTS: 03/16 CT head: No acute findings  INDWELLING DEVICES:: ETT 03/16 >> 03/19 RUE PICC 03/17 >>   MICRO DATA: MRSA PCR 03/16 >> NEG Blood 03/16 >> NEG  ANTIMICROBIALS:  Vanc 03/16 >> 03/16 Pip-tazo 03/16 >> 03/16 Cefepime 03/16 >> 03/16  Amp-sulbactam 03/16 >> 03/17    SUBJECTIVE:  Has tolerated extubation well.  Cognition much improved.  Calm and oriented.  However, unable to recall events leading up to her hospitalization.  VITAL SIGNS: BP (!) 133/99   Pulse (!) 108   Temp 98.8 F (37.1 C) (Oral)   Resp (!) 24   Ht 5\' 5"  (1.651 m)   Wt 83.8 kg (184 lb 11.9 oz)   SpO2 97%   BMI 30.74 kg/m   HEMODYNAMICS:    VENTILATOR SETTINGS: FiO2 (%):  [28 %] 28 %  INTAKE / OUTPUT: I/O last 3 completed shifts: In: 4070.9 [I.V.:2217.9; Other:1350; NG/GT:503] Out: 6175 [Urine:6175]  PHYSICAL EXAMINATION: General: Calm, NAD Neuro: No focal deficits HEENT: NCAT, sclerae white Cardiovascular: Regular, no M Lungs: No wheezes or other adventitious sounds anteriorly Abdomen: Soft, nontender, NABS Extremities: Warm, no edema  LABS:  BMET Recent Labs  Lab 01/04/18 0018 01/04/18 0407 01/05/18 0335 01/07/18 0350  NA 142  --  145 142  K 4.5  --  3.8 3.8  CL 108  --  117* 109  CO2 21*  --  22 26  BUN 18  --  8 13  CREATININE 0.86 0.82 0.67 0.55  GLUCOSE 144*  --  131* 122*    Electrolytes Recent Labs  Lab 01/04/18 0018 01/05/18 0335 01/06/18 0422 01/07/18 0350  CALCIUM 9.2 7.5*  --  7.9*  MG  --   --  1.7  --   PHOS  --   --  4.1  --     CBC Recent Labs  Lab 01/04/18 0407 01/05/18 0335 01/07/18 0350  WBC 10.5 7.4 5.8  HGB 12.0 11.2*  10.4*  HCT 36.2 33.2* 30.5*  PLT 203 196 176    Coag's Recent Labs  Lab 01/04/18 0018 01/04/18 0407  APTT  --  24  INR 0.89 0.96    Sepsis Markers Recent Labs  Lab 01/04/18 0018 01/04/18 0200  01/06/18 0422 01/07/18 0350 01/08/18 0437  LATICACIDVEN 3.3* 1.6  --   --   --   --   PROCALCITON <0.10  --    < > <0.10 0.38 0.25   < > = values in this interval not displayed.    ABG Recent Labs  Lab 01/04/18 0600 01/05/18 0447 01/06/18 0500  PHART 7.31* 7.41 7.34*  PCO2ART 48 37 45  PO2ART 115* 108 86    Liver Enzymes Recent Labs  Lab 01/04/18 0018 01/07/18 0350  AST 32 175*  ALT 35 137*  ALKPHOS 95 108  BILITOT 0.4 0.8  ALBUMIN 4.1 2.8*    Cardiac Enzymes Recent Labs  Lab 01/04/18 0407 01/04/18 0947 01/04/18 1522  TROPONINI <0.03 <0.03 <0.03    Glucose Recent Labs  Lab 01/06/18 1620 01/06/18 2013 01/06/18 2347 01/07/18 0346 01/07/18 0742 01/07/18 1159  GLUCAP 111* 96 129* 117* 105* 126*    CXR: No  new film   ASSESSMENT / PLAN:  PULMONARY A: Acute respiratory failure - initially intubated for AMS Tolerating extubation well Smoker with suspected COPD Bronchospasm, resolved Obstructive sleep apnea P:   Cont supplemental oxygen to maintain SPO2 >90% Transition back to her home COPD regimen  CARDIOVASCULAR A:  Hypotension, resolved P:  Continue hemodynamic monitoring  RENAL A:   Hypernatremia, resolved P:   Monitor BMET intermittently Monitor I/Os Correct electrolytes as indicated Discontinue D5W now that she has oral intake  GASTROINTESTINAL A:   Chronic PPI use Passed SLP eval P:   SUP: Enteral pantoprazole Advance diet as tolerated  HEMATOLOGIC A:   Mild anemia without acute blood loss P:  DVT px: Enoxaparin Monitor CBC intermittently Transfuse per usual guidelines   INFECTIOUS A:   Minimally elevated PCT without evidence of definite infection P:   Monitor temp, WBC count Micro and abx as above   Monitoring off antibiotics   NEUROLOGIC A:   Acute encephalopathy  Agitated delirium post extubation, resolved Documented history of PTSD, panic attacks, depression, anxiety P:   RASS goal: 0, -1 Discontinue dexmedetomidine infusion Resume home dose of SSRI Resume home dose of clonazepam as needed   Discussion: Her initial presentation remains unexplained.  She is not sure if she took and inadvertent or intentional overdose of medications.  It might be worth reviewing history more completely with patient and husband (who is present with her at the time that she developed her acute encephalopathy/delirium).  It is notable that she had a similar admission earlier this year.  One consideration might be that she suffered a seizure and was in an agitated postictal state.  After transfer, PCCM will sign off. Please call if we can be of further assistance    Merton Border, MD PCCM service Mobile [phone] Pager [phone]    01/08/2018, 2:46 PM

## 2018-01-08 NOTE — Progress Notes (Signed)
[facility] [place] NAME: Kim Buchanan    MR#:  [id number]  DATE OF BIRTH:  Mar 27, 1951  SUBJECTIVE:  CHIEF COMPLAINT:   Chief Complaint  Patient presents with  . Altered Mental Status  Successfully extubated on January 07, 2018, in discussion with intensivist-possible transfer to the floor later today  REVIEW OF SYSTEMS:  CONSTITUTIONAL: No fever, fatigue or weakness.  EYES: No blurred or double vision.  EARS, NOSE, AND THROAT: No tinnitus or ear pain.  RESPIRATORY: No cough, shortness of breath, wheezing or hemoptysis.  CARDIOVASCULAR: No chest pain, orthopnea, edema.  GASTROINTESTINAL: No nausea, vomiting, diarrhea or abdominal pain.  GENITOURINARY: No dysuria, hematuria.  ENDOCRINE: No polyuria, nocturia,  HEMATOLOGY: No anemia, easy bruising or bleeding SKIN: No rash or lesion. MUSCULOSKELETAL: No joint pain or arthritis.   NEUROLOGIC: No tingling, numbness, weakness.  PSYCHIATRY: No anxiety or depression.   ROS  DRUG ALLERGIES:  No Known Allergies  VITALS:  Blood pressure (!) 133/99, pulse (!) 108, temperature 98.8 F (37.1 C), temperature source Oral, resp. rate (!) 24, height 5\' 5"  (1.651 m), weight 83.8 kg (184 lb 11.9 oz), SpO2 97 %.  PHYSICAL EXAMINATION:  GENERAL:  67 y.o.-year-old patient lying in the bed with no acute distress.  EYES: Pupils equal, round, reactive to light and accommodation. No scleral icterus. Extraocular muscles intact.  HEENT: Head atraumatic, normocephalic. Oropharynx and nasopharynx clear.  NECK:  Supple, no jugular venous distention. No thyroid enlargement, no tenderness.  LUNGS: Normal breath sounds bilaterally, no wheezing, rales,rhonchi or crepitation. No use of accessory muscles of respiration.  CARDIOVASCULAR: S1, S2 normal. No murmurs, rubs, or gallops.  ABDOMEN: Soft, nontender, nondistended. Bowel sounds present. No organomegaly or mass.  EXTREMITIES: No pedal edema, cyanosis, or  clubbing.  NEUROLOGIC: Cranial nerves II through XII are intact. Muscle strength 5/5 in all extremities. Sensation intact. Gait not checked.  PSYCHIATRIC: The patient is alert and oriented x 3.  SKIN: No obvious rash, lesion, or ulcer.   Physical Exam LABORATORY PANEL:   CBC Recent Labs  Lab 01/07/18 0350  WBC 5.8  HGB 10.4*  HCT 30.5*  PLT 176   ------------------------------------------------------------------------------------------------------------------  Chemistries  Recent Labs  Lab 01/06/18 0422 01/07/18 0350  NA  --  142  K  --  3.8  CL  --  109  CO2  --  26  GLUCOSE  --  122*  BUN  --  13  CREATININE  --  0.55  CALCIUM  --  7.9*  MG 1.7  --   AST  --  175*  ALT  --  137*  ALKPHOS  --  108  BILITOT  --  0.8   ------------------------------------------------------------------------------------------------------------------  Cardiac Enzymes Recent Labs  Lab 01/04/18 0947 01/04/18 1522  TROPONINI <0.03 <0.03   ------------------------------------------------------------------------------------------------------------------  RADIOLOGY:  Dg Chest Port 1 View  Result Date: 01/07/2018 CLINICAL DATA:  Respiratory failure. EXAM: PORTABLE CHEST 1 VIEW COMPARISON:  01/05/2018. FINDINGS: Endotracheal tube, NG tube, right PICC line in stable position. Heart size stable. Low lung volumes with mild bibasilar atelectasis. No pleural effusion or pneumothorax. Thoracic spine scoliosis and degenerative change. IMPRESSION: 1.  Lines and tubes in stable position. 2.  Low lung volumes with mild bibasilar atelectasis. Electronically Signed   By: Marcello Moores  Register   On: 01/07/2018 06:35    ASSESSMENT AND PLAN:  67 yo female admitted with acute encephalopathy likely secondary to accidental vs. unintentional drug overdose requiring mechanical intubation for  airway protection  1Acute respiratory failure with hypoxia Improved Most likely secondary to recurrent probable  drug overdose-dischargedlast month for similar presentation  Status post extubation on January 07, 2018, continue to wean O2 off as tolerated, possible transfer to the floor later today in discussion with intensivist  Urine drug screen noted for benzodiazepines and tricyclics  2acute toxic metabolic encephalopathy Resolved Most likely secondary to acute recurrent drug overdose and acute hypoxic respiratory failure Continue to holdpsychotropic meds ammonia level normal,RPRnonreactive  3chronic GERD Stable PPI daily  4chronic depression Past medical history noted also for PTSD/panic attacks consider psychiatryconsultation once medically stable  5acute possiblesepsis Ruled out  6acute shock Resolved Weaned off norepinephrine  All the records are reviewed and case discussed with Care Management/Social Workerr. Management plans discussed with the patient, family and they are in agreement.  CODE STATUS: full  TOTAL TIME TAKING CARE OF THIS PATIENT: 35 minutes.   POSSIBLE D/C IN 1-3 DAYS, DEPENDING ON CLINICAL CONDITION.   Avel Peace Salary M.D on 01/08/2018   Between 7am to 6pm - Pager - [phone]  After 6pm go to [email or web address] - password [email or web address] [facility]  Office  [phone]  CC: Primary care physician; Mikey College, NP  Note: This dictation was prepared with Dragon dictation along with smaller phrase technology. Any transcriptional errors that result from this process are unintentional.

## 2018-01-09 LAB — CULTURE, BLOOD (ROUTINE X 2)
Culture: NO GROWTH
Culture: NO GROWTH
Special Requests: ADEQUATE
Special Requests: ADEQUATE

## 2018-01-09 MED ORDER — LACTULOSE 10 GM/15ML PO SOLN
30.0000 g | Freq: Two times a day (BID) | ORAL | Status: DC
  Administered 2018-01-09 – 2018-01-10 (×3): 30 g via ORAL
  Filled 2018-01-09 (×3): qty 60

## 2018-01-09 MED ORDER — POLYETHYLENE GLYCOL 3350 17 G PO PACK
17.0000 g | PACK | Freq: Every day | ORAL | Status: DC | PRN
  Administered 2018-01-09: 17 g via ORAL
  Filled 2018-01-09: qty 1

## 2018-01-09 MED ORDER — MEGESTROL ACETATE 400 MG/10ML PO SUSP
400.0000 mg | Freq: Two times a day (BID) | ORAL | Status: DC
  Administered 2018-01-09 – 2018-01-10 (×3): 400 mg via ORAL
  Filled 2018-01-09 (×5): qty 10

## 2018-01-09 NOTE — Evaluation (Signed)
Physical Therapy Evaluation Patient Details Name: Kim Buchanan MRN: [id number] DOB: April 25, 1951 Today's Date: 01/09/2018   History of Present Illness  67 y.o. female here with accidental OD with AMS with intubation, was discharged last month for similar presentation for drug overdose.  Clinical Impression  Pt initially feeling very weak and limited (having not been out of bed for several days) but once she warmed up she did relatively well.  She needed some assist getting to sitting EOB, but was able to rise to standing and walk ~175 ft (with walker much of the time, no AD at baseline) and needed only light gait training/guarding to maintain safety with walker, purposeful breathing and general cadence education/training. Pt should be able to return home on d/c, will benefit from rolling walker.      Follow Up Recommendations Home health PT    Equipment Recommendations  Rolling walker with 5" wheels    Recommendations for Other Services Rehab consult     Precautions / Restrictions Precautions Precautions: Fall Restrictions Weight Bearing Restrictions: No      Mobility  Bed Mobility Overal bed mobility: Needs Assistance Bed Mobility: Supine to Sit     Supine to sit: Min assist     General bed mobility comments: light assist to get to fully upright sitting at EOB  Transfers Overall transfer level: Modified independent Equipment used: Rolling walker (2 wheeled)             General transfer comment: needed UEs to rise, but able to do so w/o assist  Ambulation/Gait Ambulation/Gait assistance: Min guard Ambulation Distance (Feet): 175 Feet Assistive device: Rolling walker (2 wheeled)       General Gait Details: Pt with cautious but safe ambulation, she was initially very reliant on the walker and needed cuing to use walker properly, maintain consistent cadence/speed.  Did last 25 ft with only single UE use of hallway rail.   Stairs            Wheelchair  Mobility    Modified Rankin (Stroke Patients Only)       Balance Overall balance assessment: Modified Independent                                           Pertinent Vitals/Pain Pain Assessment: No/denies pain    Home Living Family/patient expects to be discharged to:: Private residence Living Arrangements: Spouse/significant other Available Help at Discharge: Family Type of Home: Mobile home Home Access: Stairs to enter Entrance Stairs-Rails: Can reach both Entrance Stairs-Number of Steps: 4 Home Layout: One level Home Equipment: Walker - standard;Cane - single point;Bedside commode;Shower seat      Prior Function Level of Independence: Independent         Comments: Pt previoulsy independent with ADLs/IADLs. No falls     Hand Dominance        Extremity/Trunk Assessment   Upper Extremity Assessment Upper Extremity Assessment: Overall WFL for tasks assessed    Lower Extremity Assessment Lower Extremity Assessment: Overall WFL for tasks assessed       Communication   Communication: No difficulties  Cognition Arousal/Alertness: Awake/alert Behavior During Therapy: WFL for tasks assessed/performed Overall Cognitive Status: Within Functional Limits for tasks assessed  General Comments      Exercises     Assessment/Plan    PT Assessment Patient needs continued PT services  PT Problem List Decreased strength;Decreased range of motion;Decreased activity tolerance;Decreased balance;Decreased mobility;Decreased coordination;Decreased cognition;Decreased safety awareness       PT Treatment Interventions DME instruction;Gait training;Stair training;Functional mobility training;Therapeutic activities;Therapeutic exercise;Neuromuscular re-education    PT Goals (Current goals can be found in the Care Plan section)  Acute Rehab PT Goals Patient Stated Goal: go home PT Goal Formulation:  With patient Time For Goal Achievement: 01/23/18 Potential to Achieve Goals: Good    Frequency Min 2X/week   Barriers to discharge        Co-evaluation               AM-PAC PT "6 Clicks" Daily Activity  Outcome Measure Difficulty turning over in bed (including adjusting bedclothes, sheets and blankets)?: A Little Difficulty moving from lying on back to sitting on the side of the bed? : Unable Difficulty sitting down on and standing up from a chair with arms (e.g., wheelchair, bedside commode, etc,.)?: A Little Help needed moving to and from a bed to chair (including a wheelchair)?: None Help needed walking in hospital room?: A Little Help needed climbing 3-5 steps with a railing? : A Little 6 Click Score: 17    End of Session Equipment Utilized During Treatment: Gait belt Activity Tolerance: Patient limited by fatigue Patient left: with chair alarm set;with call bell/phone within reach;with family/visitor present   PT Visit Diagnosis: Muscle weakness (generalized) (M62.81);Difficulty in walking, not elsewhere classified (R26.2)    Time: 8341-9622 PT Time Calculation (min) (ACUTE ONLY): 28 min   Charges:   PT Evaluation $PT Eval Low Complexity: 1 Low PT Treatments $Gait Training: 8-22 mins   PT G Codes:        Kreg Shropshire, DPT 01/09/2018, 3:31 PM

## 2018-01-09 NOTE — Progress Notes (Signed)
Patient received as transfer from ICU. Spouse at the bedside. Upper dentures not present at transfer. Spouse has patient's clothing. Patient wearing glasses.

## 2018-01-09 NOTE — Progress Notes (Signed)
Notified Dr. Jerelyn Charles that patient has not had a BM in 6 days. Patient states she takes miralax. Order received for miralax 17g daily as needed for mild constipation

## 2018-01-09 NOTE — Progress Notes (Signed)
[facility] [place] NAME: Kim Buchanan    MR#:  [id number]  DATE OF BIRTH:  07/30/1951  SUBJECTIVE:  CHIEF COMPLAINT:   Chief Complaint  Patient presents with  . Altered Mental Status  No events overnight, in discussion with intensivist, for floor later today  REVIEW OF SYSTEMS:  CONSTITUTIONAL: No fever, fatigue or weakness.  EYES: No blurred or double vision.  EARS, NOSE, AND THROAT: No tinnitus or ear pain.  RESPIRATORY: No cough, shortness of breath, wheezing or hemoptysis.  CARDIOVASCULAR: No chest pain, orthopnea, edema.  GASTROINTESTINAL: No nausea, vomiting, diarrhea or abdominal pain.  GENITOURINARY: No dysuria, hematuria.  ENDOCRINE: No polyuria, nocturia,  HEMATOLOGY: No anemia, easy bruising or bleeding SKIN: No rash or lesion. MUSCULOSKELETAL: No joint pain or arthritis.   NEUROLOGIC: No tingling, numbness, weakness.  PSYCHIATRY: No anxiety or depression.   ROS  DRUG ALLERGIES:  No Known Allergies  VITALS:  Blood pressure (!) 121/56, pulse 100, temperature 99 F (37.2 C), temperature source Oral, resp. rate 18, height 5\' 5"  (1.651 m), weight 83.8 kg (184 lb 11.9 oz), SpO2 95 %.  PHYSICAL EXAMINATION:  GENERAL:  67 y.o.-year-old patient lying in the bed with no acute distress.  EYES: Pupils equal, round, reactive to light and accommodation. No scleral icterus. Extraocular muscles intact.  HEENT: Head atraumatic, normocephalic. Oropharynx and nasopharynx clear.  NECK:  Supple, no jugular venous distention. No thyroid enlargement, no tenderness.  LUNGS: Normal breath sounds bilaterally, no wheezing, rales,rhonchi or crepitation. No use of accessory muscles of respiration.  CARDIOVASCULAR: S1, S2 normal. No murmurs, rubs, or gallops.  ABDOMEN: Soft, nontender, nondistended. Bowel sounds present. No organomegaly or mass.  EXTREMITIES: No pedal edema, cyanosis, or clubbing.  NEUROLOGIC: Cranial nerves II through XII are  intact. Muscle strength 5/5 in all extremities. Sensation intact. Gait not checked.  PSYCHIATRIC: The patient is alert and oriented x 3.  SKIN: No obvious rash, lesion, or ulcer.   Physical Exam LABORATORY PANEL:   CBC Recent Labs  Lab 01/07/18 0350  WBC 5.8  HGB 10.4*  HCT 30.5*  PLT 176   ------------------------------------------------------------------------------------------------------------------  Chemistries  Recent Labs  Lab 01/06/18 0422 01/07/18 0350  NA  --  142  K  --  3.8  CL  --  109  CO2  --  26  GLUCOSE  --  122*  BUN  --  13  CREATININE  --  0.55  CALCIUM  --  7.9*  MG 1.7  --   AST  --  175*  ALT  --  137*  ALKPHOS  --  108  BILITOT  --  0.8   ------------------------------------------------------------------------------------------------------------------  Cardiac Enzymes Recent Labs  Lab 01/04/18 0947 01/04/18 1522  TROPONINI <0.03 <0.03   ------------------------------------------------------------------------------------------------------------------  RADIOLOGY:  No results found.  ASSESSMENT AND PLAN:  67 yo female admitted with acute encephalopathy likely secondary to accidental vs. unintentional drug overdose requiring mechanical intubation for airway protection  1Acute respiratory failure with hypoxia Improved Most likely secondary to recurrent probable drug overdose-dischargedlast month for similar presentation  Status post extubation on January 07, 2018, continue to wean O2 off as tolerated, and discussion with intensivist- for transfer to the floor later today in discussion with intensivist  UDS noted for benzodiazepines and tricyclics  2acute toxic metabolic encephalopathy Resolved Most likely secondary to acute recurrent drug overdose and acute hypoxic respiratory failure Avoid unnecessary sedating agents  Ammonia level normal,RPRnonreactive  3chronic GERD Stable PPI daily  4chronic  depression Stable Past medical history noted also for PTSD/panic attacks  5acute possiblesepsis Ruled out  6acute shock Resolved Weaned off norepinephrine  All the records are reviewed and case discussed with Care Management/Social Workerr. Management plans discussed with the patient, family and they are in agreement.  CODE STATUS: full  TOTAL TIME TAKING CARE OF THIS PATIENT: 35 minutes.     POSSIBLE D/C IN 1-2 DAYS, DEPENDING ON CLINICAL CONDITION.   Avel Peace Jermond Burkemper M.D on 01/09/2018   Between 7am to 6pm - Pager - [phone]  After 6pm go to [email or web address] - password [email or web address] [facility]  Office  [phone]  CC: Primary care physician; Mikey College, NP  Note: This dictation was prepared with Dragon dictation along with smaller phrase technology. Any transcriptional errors that result from this process are unintentional.

## 2018-01-10 NOTE — Plan of Care (Signed)
  Problem: Education: Goal: Knowledge of General Education information will improve Outcome: Adequate for Discharge   Problem: Health Behavior/Discharge Planning: Goal: Ability to manage health-related needs will improve Outcome: Adequate for Discharge   Problem: Clinical Measurements: Goal: Ability to maintain clinical measurements within normal limits will improve Outcome: Adequate for Discharge Goal: Will remain free from infection Outcome: Adequate for Discharge Goal: Diagnostic test results will improve Outcome: Adequate for Discharge Goal: Respiratory complications will improve Outcome: Adequate for Discharge Goal: Cardiovascular complication will be avoided Outcome: Adequate for Discharge   Problem: Activity: Goal: Risk for activity intolerance will decrease Outcome: Adequate for Discharge   Problem: Nutrition: Goal: Adequate nutrition will be maintained Outcome: Adequate for Discharge   Problem: Coping: Goal: Level of anxiety will decrease Outcome: Adequate for Discharge   Problem: Elimination: Goal: Will not experience complications related to bowel motility Outcome: Adequate for Discharge Goal: Will not experience complications related to urinary retention Outcome: Adequate for Discharge   Problem: Pain Managment: Goal: General experience of comfort will improve Outcome: Adequate for Discharge   Problem: Safety: Goal: Ability to remain free from injury will improve Outcome: Adequate for Discharge   Problem: Skin Integrity: Goal: Risk for impaired skin integrity will decrease Outcome: Adequate for Discharge   Problem: Fluid Volume: Goal: Hemodynamic stability will improve Outcome: Adequate for Discharge   Problem: Clinical Measurements: Goal: Diagnostic test results will improve Outcome: Adequate for Discharge Goal: Signs and symptoms of infection will decrease Outcome: Adequate for Discharge   Problem: Respiratory: Goal: Ability to maintain  adequate ventilation will improve Outcome: Adequate for Discharge

## 2018-01-10 NOTE — Care Management Note (Signed)
Case Management Note  Patient Details  Name: Kim Buchanan MRN: [id number] Date of Birth: Sep 22, 1951  Subjective/Objective:                  RNCM met with patient and her husband regarding home health services. [facility] and they have been notified of patient discharge to home today.  She will need rolling walker prior to discharge which has also been requested from [facility]. PCP is with [facility] in [place].  She is not longer requiring supplemental O2.   Action/Plan: No other RNCM needs.   Expected Discharge Date:  01/10/18               Expected Discharge Plan:     In-House Referral:     Discharge planning Services  CM Consult  Post Acute Care Choice:  Durable Medical Equipment, Home Health Choice offered to:  Patient, Spouse  DME Arranged:  Walker rolling DME Agency:  [place]:  PT, RN [facility] Agency:  [place]  Status of Service:  Completed, signed off  If discussed at [place] of Stay Meetings, dates discussed:    Additional Comments:  Marshell Garfinkel, RN 01/10/2018, 12:13 PM

## 2018-01-10 NOTE — Discharge Summary (Signed)
[facility] NAME: Kim Buchanan    MR#:  [id number]  DATE OF BIRTH:  [birth year]  DATE OF ADMISSION:  01/04/2018 ADMITTING PHYSICIAN: Gorden Harms, MD  DATE OF DISCHARGE: No discharge date for patient encounter.  PRIMARY CARE PHYSICIAN: Mikey College, NP    ADMISSION DIAGNOSIS:  Lactic acidosis [E87.2] Encephalopathy [G93.40] Altered mental status, unspecified altered mental status type [R41.82]  DISCHARGE DIAGNOSIS:  Active Problems:   Acute respiratory failure ([place])   SECONDARY DIAGNOSIS:   Past Medical History:  Diagnosis Date  . Anxiety   . Arthritis    lower back  . Depression   . GERD (gastroesophageal reflux disease)   . Hyperlipidemia   . Leaky heart valve    sees Dr Humphrey Rolls  . Panic attack   . PTSD (post-traumatic stress disorder)   . Rectal fistula   . Sleep apnea   . Wears dentures    full upper    HOSPITAL COURSE:  67 yo female admitted with acute encephalopathy likely secondary to accidental vs. unintentional drug overdose requiring mechanical intubation for airway protection  1Acute respiratory failure with hypoxia Resolved Most likely secondary to recurrent probable drug overdose-dischargedlast month for similar presentation He require ICU care, s/p extubation on January 07, 2018,successfully weaned off oxygen UDS noted for benzodiazepines and tricyclics  2acute toxic metabolic encephalopathy Resolved Most likely secondary to acute recurrent drug overdose and acute hypoxic respiratory failure Avoided unnecessary sedating agents  Ammonia level normal,RPRnonreactive  3chronic GERD Stable PPI daily  4chronic depression Stable Past medical history noted also for PTSD/panic attacks  5acute possiblesepsis Ruled out  6acute shock Resolved Weaned off norepinephrine   DISCHARGE CONDITIONS:  On the day of discharge patient is afebrile, hemogram  stable, tolerating diet, discharged home in care of family with appropriate follow-up, for more specific details please see chart   CONSULTS OBTAINED:    DRUG ALLERGIES:  No Known Allergies  DISCHARGE MEDICATIONS:   Allergies as of 01/10/2018   No Known Allergies     Medication List    STOP taking these medications   guaiFENesin 600 MG 12 hr tablet Commonly known as:  MUCINEX     TAKE these medications   albuterol 108 (90 Base) MCG/ACT inhaler Commonly known as:  PROVENTIL HFA;VENTOLIN HFA Inhale 1-2 puffs into the lungs every 6 (six) hours as needed for wheezing or shortness of breath.   amitriptyline 25 MG tablet Commonly known as:  ELAVIL Take 1 tablet (25 mg total) by mouth at bedtime.   aspirin 81 MG tablet Take 81 mg by mouth daily.   atorvastatin 20 MG tablet Commonly known as:  LIPITOR Take 1 tablet (20 mg total) by mouth at bedtime.   budesonide-formoterol 160-4.5 MCG/ACT inhaler Commonly known as:  SYMBICORT Inhale 2 puffs into the lungs 2 (two) times daily. Rinse mouth after use   clonazePAM 0.5 MG tablet Commonly known as:  KLONOPIN Take 1 tablet (0.5 mg total) by mouth 2 (two) times daily as needed for anxiety.   ibuprofen 800 MG tablet Commonly known as:  ADVIL,MOTRIN Take 1 tablet (800 mg total) by mouth 3 (three) times daily.   isosorbide dinitrate 30 MG tablet Commonly known as:  ISORDIL Take 1 tablet (30 mg total) by mouth daily.   omeprazole 40 MG capsule Commonly known as:  PRILOSEC Take 1 capsule (40 mg total) by mouth daily.   venlafaxine XR 75 MG 24 hr capsule Commonly known  as:  EFFEXOR-XR Take 1 capsule (75 mg total) by mouth at bedtime.   venlafaxine XR 150 MG 24 hr capsule Commonly known as:  EFFEXOR-XR Take 1 capsule (150 mg total) by mouth at bedtime.            Durable Medical Equipment  (From admission, onward)        Start     Ordered   01/10/18 1101  For home use only DME Gilford Rile  [facility])  Once    Comments:   Rolling walker with 5 inch wheels  Question:  Patient needs a walker to treat with the following condition  Answer:  Muscular deconditioning   01/10/18 1101       DISCHARGE INSTRUCTIONS:   If you experience worsening of your admission symptoms, develop shortness of breath, life threatening emergency, suicidal or homicidal thoughts you must seek medical attention immediately by calling 911 or calling your MD immediately  if symptoms less severe.  You Must read complete instructions/literature along with all the possible adverse reactions/side effects for all the Medicines you take and that have been prescribed to you. Take any new Medicines after you have completely understood and accept all the possible adverse reactions/side effects.   Please note  You were cared for by a hospitalist during your hospital stay. If you have any questions about your discharge medications or the care you received while you were in the hospital after you are discharged, you can call the unit and asked to speak with the hospitalist on call if the hospitalist that took care of you is not available. Once you are discharged, your primary care physician will handle any further medical issues. Please note that NO REFILLS for any discharge medications will be authorized once you are discharged, as it is imperative that you return to your primary care physician (or establish a relationship with a primary care physician if you do not have one) for your aftercare needs so that they can reassess your need for medications and monitor your lab values.    Today   CHIEF COMPLAINT:   Chief Complaint  Patient presents with  . Altered Mental Status    HISTORY OF PRESENT ILLNESS:  67 y.o. female with a known history per below which also includes drug overdoses requiring intubation, was discharged last month for similar presentation for drug overdose, brought to the emergency room for acute altered mental status, patient was  combative in route to the hospital, given 1 mg IM Versed, in the emergency room patient was obtunded, drooling, emergently intubated for airway protection, patient noted to be tachycardic with heart rate of 109, white count of 13,000 with lactic acid 3.3, code sepsis initiated, CT head negative, chest x-ray negative, patient evaluated in the emergency room, resting comfortably intubated on propofol drip, patient is now being admitted for acute hypoxic respiratory failure, acute probable aspiration pneumonia with associated sepsis.  VITAL SIGNS:  Blood pressure (!) 91/45, pulse 94, temperature 98 F (36.7 C), temperature source Oral, resp. rate 18, height 5\' 5"  (1.651 m), weight 83.8 kg (184 lb 11.9 oz), SpO2 93 %.  I/O:    Intake/Output Summary (Last 24 hours) at 01/10/2018 1101 Last data filed at 01/10/2018 1051 Gross per 24 hour  Intake 610 ml  Output -  Net 610 ml    PHYSICAL EXAMINATION:  GENERAL:  67 y.o.-year-old patient lying in the bed with no acute distress.  EYES: Pupils equal, round, reactive to light and accommodation. No scleral icterus. Extraocular  muscles intact.  HEENT: Head atraumatic, normocephalic. Oropharynx and nasopharynx clear.  NECK:  Supple, no jugular venous distention. No thyroid enlargement, no tenderness.  LUNGS: Normal breath sounds bilaterally, no wheezing, rales,rhonchi or crepitation. No use of accessory muscles of respiration.  CARDIOVASCULAR: S1, S2 normal. No murmurs, rubs, or gallops.  ABDOMEN: Soft, non-tender, non-distended. Bowel sounds present. No organomegaly or mass.  EXTREMITIES: No pedal edema, cyanosis, or clubbing.  NEUROLOGIC: Cranial nerves II through XII are intact. Muscle strength 5/5 in all extremities. Sensation intact. Gait not checked.  PSYCHIATRIC: The patient is alert and oriented x 3.  SKIN: No obvious rash, lesion, or ulcer.   DATA REVIEW:   CBC Recent Labs  Lab 01/07/18 0350  WBC 5.8  HGB 10.4*  HCT 30.5*  PLT 176     Chemistries  Recent Labs  Lab 01/06/18 0422 01/07/18 0350  NA  --  142  K  --  3.8  CL  --  109  CO2  --  26  GLUCOSE  --  122*  BUN  --  13  CREATININE  --  0.55  CALCIUM  --  7.9*  MG 1.7  --   AST  --  175*  ALT  --  137*  ALKPHOS  --  108  BILITOT  --  0.8    Cardiac Enzymes Recent Labs  Lab 01/04/18 1522  TROPONINI <0.03    Microbiology Results  Results for orders placed or performed during the hospital encounter of 01/04/18  Blood Culture (routine x 2)     Status: None   Collection Time: 01/04/18 12:18 AM  Result Value Ref Range Status   Specimen Description BLOOD LEFT ANTECUBITAL  Final   Special Requests   Final    BOTTLES DRAWN AEROBIC AND ANAEROBIC Blood Culture adequate volume   Culture   Final    NO GROWTH 5 DAYS Performed at [facility], [address] [place], [place] [address]    Report Status 01/09/2018 FINAL  Final  Blood Culture (routine x 2)     Status: None   Collection Time: 01/04/18  1:19 AM  Result Value Ref Range Status   Specimen Description BLOOD BLOOD RIGHT HAND  Final   Special Requests   Final    BOTTLES DRAWN AEROBIC AND ANAEROBIC Blood Culture adequate volume   Culture   Final    NO GROWTH 5 DAYS Performed at [facility], [address] [place], [place] [address]    Report Status 01/09/2018 FINAL  Final  MRSA PCR Screening     Status: None   Collection Time: 01/04/18  3:49 AM  Result Value Ref Range Status   MRSA by PCR NEGATIVE NEGATIVE Final    Comment:        The GeneXpert MRSA Assay (FDA approved for NASAL specimens only), is one component of a comprehensive MRSA colonization surveillance program. It is not intended to diagnose MRSA infection nor to guide or monitor treatment for MRSA infections. Performed at [facility], [address], [place], [place] [address]     RADIOLOGY:  No results found.  EKG:   Orders placed or performed during the hospital encounter of  01/04/18  . ED EKG  . ED EKG  . EKG 12-Lead  . EKG 12-Lead      Management plans discussed with the patient, family and they are in agreement.  CODE STATUS:     Code Status Orders  (From admission, onward)  Start     Ordered   01/04/18 0338  Full code  Continuous     01/04/18 0338    Code Status History    Date Active Date Inactive Code Status Order ID Comments User Context   11/14/2017 0137 12/17/2017 2021 Full Code [id number]  Lance Coon, MD Inpatient   12/17/2016 1146 12/18/2016 1805 Full Code [id number]  Defrancesco, Alanda Slim, MD Inpatient      TOTAL TIME TAKING CARE OF THIS PATIENT: 45 minutes.    Avel Peace Journi Moffa M.D on 01/10/2018 at 11:01 AM  Between 7am to 6pm - Pager - [phone]  After 6pm go to [email or web address] - password [email or web address] [facility]  Office  [phone]  CC: Primary care physician; Mikey College, NP   Note: This dictation was prepared with Dragon dictation along with smaller phrase technology. Any transcriptional errors that result from this process are unintentional.

## 2018-01-10 NOTE — Progress Notes (Signed)
Pt ready for discharge home with husband. Reviewed all d/c instructions including f/u appointments and prescriptions

## 2018-01-10 NOTE — Care Management Important Message (Signed)
Important Message  Patient Details  Name: Kim Buchanan MRN: [id number] Date of Birth: [birth year]   Medicare Important Message Given:  Yes    Marshell Garfinkel, RN 01/10/2018, 8:39 AM

## 2018-01-11 ENCOUNTER — Other Ambulatory Visit: Payer: Self-pay | Admitting: Nurse Practitioner

## 2018-01-11 DIAGNOSIS — I25118 Atherosclerotic heart disease of native coronary artery with other forms of angina pectoris: Secondary | ICD-10-CM

## 2018-01-11 DIAGNOSIS — E782 Mixed hyperlipidemia: Secondary | ICD-10-CM

## 2018-01-11 MED ORDER — ATORVASTATIN CALCIUM 20 MG PO TABS: 20.0000 mg | ORAL_TABLET | Freq: Every day | ORAL | 11 refills | Status: DC

## 2018-01-11 MED ORDER — ISOSORBIDE MONONITRATE ER 30 MG PO TB24: 30.0000 mg | ORAL_TABLET | Freq: Every day | ORAL | 6 refills | Status: DC

## 2018-01-15 ENCOUNTER — Telehealth: Payer: Self-pay | Admitting: Family Medicine

## 2018-01-15 LAB — BLOOD GAS, ARTERIAL
Acid-base deficit: 1.6 mmol/L (ref 0.0–2.0)
Bicarbonate: 24.3 mmol/L (ref 20.0–28.0)
FIO2: 0.4
MECHVT: 450 mL
O2 Saturation: 95.9 %
PEEP: 5 cmH2O
Patient temperature: 37
RATE: 20 resp/min
pCO2 arterial: 45 mmHg (ref 32.0–48.0)
pH, Arterial: 7.34 — ABNORMAL LOW (ref 7.350–7.450)
pO2, Arterial: 86 mmHg (ref 83.0–108.0)

## 2018-01-15 NOTE — Telephone Encounter (Signed)
Misty with [facility] said pt discharged from hospital a few days ago and they have been unable to get in touch with pt to resume home health care.  Should they continue to try to call?  Her call back number is [phone]

## 2018-01-16 ENCOUNTER — Telehealth: Payer: Self-pay

## 2018-01-16 ENCOUNTER — Inpatient Hospital Stay: Payer: Self-pay | Admitting: Nurse Practitioner

## 2018-01-16 NOTE — Telephone Encounter (Signed)
I contacted [facility] and spoke w/ Misty and she informed me that she did contact [facility] and [facility] and left a message on both vm.

## 2018-01-16 NOTE — Telephone Encounter (Signed)
I attempted to contact the pt and her husband, no answer to f/u with her about her missed appt.  I left a message on both the patient and her husband vm to return my call.

## 2018-01-16 NOTE — Telephone Encounter (Signed)
Kim Buchanan called back and informed me that he was not aware of Kim Buchanan appt. The appt was rescheduled to tomorrow.

## 2018-01-16 NOTE — Telephone Encounter (Signed)
Please provide HH with phone number for [facility].  Pt can still decline services, but Kim Buchanan helps her understand making decisions for care.  Please continue to make contact with patient.

## 2018-01-17 ENCOUNTER — Other Ambulatory Visit: Payer: Self-pay

## 2018-01-17 ENCOUNTER — Inpatient Hospital Stay: Payer: Self-pay | Admitting: Nurse Practitioner

## 2018-01-17 DIAGNOSIS — K219 Gastro-esophageal reflux disease without esophagitis: Secondary | ICD-10-CM | POA: Diagnosis not present

## 2018-01-17 DIAGNOSIS — F17210 Nicotine dependence, cigarettes, uncomplicated: Secondary | ICD-10-CM | POA: Diagnosis not present

## 2018-01-17 DIAGNOSIS — J440 Chronic obstructive pulmonary disease with acute lower respiratory infection: Secondary | ICD-10-CM | POA: Diagnosis not present

## 2018-01-17 DIAGNOSIS — F411 Generalized anxiety disorder: Secondary | ICD-10-CM | POA: Diagnosis not present

## 2018-01-17 DIAGNOSIS — F4310 Post-traumatic stress disorder, unspecified: Secondary | ICD-10-CM | POA: Diagnosis not present

## 2018-01-17 DIAGNOSIS — G4730 Sleep apnea, unspecified: Secondary | ICD-10-CM | POA: Diagnosis not present

## 2018-01-17 DIAGNOSIS — J441 Chronic obstructive pulmonary disease with (acute) exacerbation: Secondary | ICD-10-CM | POA: Diagnosis not present

## 2018-01-17 DIAGNOSIS — J189 Pneumonia, unspecified organism: Secondary | ICD-10-CM | POA: Diagnosis not present

## 2018-01-17 DIAGNOSIS — F339 Major depressive disorder, recurrent, unspecified: Secondary | ICD-10-CM | POA: Diagnosis not present

## 2018-01-17 NOTE — Patient Outreach (Signed)
[facility]) Care Management  01/17/2018  Kim Buchanan 1951/04/01 [id number]   EMMI- General Discharge RED ON EMMI ALERT Day # 4 Date:  01/16/18 Red Alert Reason:  Questions about discharge papers? Yes Transportation to follow-up? no   Outreach attempt # 1 Telephone call to patient for EMMI red follow up. No answer.  HIPAA compliant voice message left.   Plan: RN CM will send letter and will attempt patient again within 4 business days.  Jone Baseman, RN, MSN [facility] Care Management Care Management Coordinator Direct Line [phone] Toll Free: [phone]  Fax: [phone]

## 2018-01-19 NOTE — Assessment & Plan Note (Signed)
Patient without recent evaluation of lipid status.  Patient with prior known hyperlipidemia.  Obtaining labs today, so we will also check lipid panel.  Medication adjustments to follow.

## 2018-01-19 NOTE — Assessment & Plan Note (Signed)
Pt has long history of chronic back pain and prior interventional pain management treatment.  Pt declines neurosurgery options as well.  Has previously been managed only w/ opioid pain medication, but has now been off for several weeks.  Pt w/ significantly improved hypertonicity of paraspinal muscles with decreased activity level.   Plan: 1. Discussed no chronic opioids from this clinic.  Pt verbalizes understanding. 2. May only take total of 20 mg baclofen daily as she has not needed higher doses in the last several weeks.  Patient should consider at lower dose and should only take if having back spasms. 3. Encouraged daily stretching as tolerated and consideration of PT. 4. Followup 4 weeks.

## 2018-01-19 NOTE — Assessment & Plan Note (Signed)
Patient with ongoing difficulty with insomnia.  She reports having action of amitriptyline with assistance for sleep onset within 1 hour of taking it.  Patient admits to taking to the amitriptyline occasionally and falling asleep very quickly.  She requests that this be increased today.  Upon review of sleep hygiene, patient is falling asleep in bed room with TV on.  She keeps a standard bedtime routine medications about 1 hour before she falls asleep.  This provider is unsure of the goal patient is trying to accomplish with medications at bedtime.  Plan: 1.  Continue amitriptyline 25 mg at bedtime.  No dose increase will be considered today. 2.  Encouraged significant work on sleep hygiene with reducing screen time prior to falling asleep. 3.  In future, consider change from amitriptyline to trazodone.  Patient continues amitriptyline today after discussion of options for impact amitriptyline is having on back pain. 4.  Follow-up 4 weeks

## 2018-01-19 NOTE — Assessment & Plan Note (Addendum)
Patient continues to report stable moods.  Symptom control is not optimal for patient.  However, symptoms have not worsened.  On exam today, patient is more withdrawn and is not willing to discuss her symptoms openly.  Since December gad 7 scores have increased steadily.  PHQ 9 scores remain stable.  Plan: 1.  Continue medications without changes today.  Continue reduction of clonazepam to half milligrams twice daily as needed for panic.  Patient verbalizes understanding.  Patient also now at safe regimen to continue for panic disorder given patient does not have any additional overdoses.  Discussion with patient in clinic today reveals that concern for overdose may be unfounded.  Patient continues to deny she took additional medications. 2.  Reviewed controlled substance contract with patient today. 3.  Follow-up 4 weeks.  Consider counseling.  Consider referral to psychiatry.  Patient has continued to decline these referrals.

## 2018-01-20 DIAGNOSIS — J449 Chronic obstructive pulmonary disease, unspecified: Secondary | ICD-10-CM | POA: Diagnosis not present

## 2018-01-20 DIAGNOSIS — J9602 Acute respiratory failure with hypercapnia: Secondary | ICD-10-CM | POA: Diagnosis not present

## 2018-01-21 ENCOUNTER — Ambulatory Visit (INDEPENDENT_AMBULATORY_CARE_PROVIDER_SITE_OTHER): Payer: Medicare HMO | Admitting: Nurse Practitioner

## 2018-01-21 ENCOUNTER — Other Ambulatory Visit: Payer: Self-pay

## 2018-01-21 ENCOUNTER — Encounter: Payer: Self-pay | Admitting: Nurse Practitioner

## 2018-01-21 VITALS — BP 120/54 | HR 91 | Temp 98.4°F | Ht 65.0 in | Wt 177.4 lb

## 2018-01-21 DIAGNOSIS — F17210 Nicotine dependence, cigarettes, uncomplicated: Secondary | ICD-10-CM | POA: Diagnosis not present

## 2018-01-21 DIAGNOSIS — J441 Chronic obstructive pulmonary disease with (acute) exacerbation: Secondary | ICD-10-CM | POA: Diagnosis not present

## 2018-01-21 DIAGNOSIS — F411 Generalized anxiety disorder: Secondary | ICD-10-CM

## 2018-01-21 DIAGNOSIS — G9340 Encephalopathy, unspecified: Secondary | ICD-10-CM

## 2018-01-21 DIAGNOSIS — J189 Pneumonia, unspecified organism: Secondary | ICD-10-CM | POA: Diagnosis not present

## 2018-01-21 DIAGNOSIS — F410 Panic disorder [episodic paroxysmal anxiety] without agoraphobia: Secondary | ICD-10-CM

## 2018-01-21 DIAGNOSIS — F4310 Post-traumatic stress disorder, unspecified: Secondary | ICD-10-CM | POA: Diagnosis not present

## 2018-01-21 DIAGNOSIS — G4730 Sleep apnea, unspecified: Secondary | ICD-10-CM | POA: Diagnosis not present

## 2018-01-21 DIAGNOSIS — L299 Pruritus, unspecified: Secondary | ICD-10-CM | POA: Diagnosis not present

## 2018-01-21 DIAGNOSIS — F339 Major depressive disorder, recurrent, unspecified: Secondary | ICD-10-CM | POA: Diagnosis not present

## 2018-01-21 DIAGNOSIS — J440 Chronic obstructive pulmonary disease with acute lower respiratory infection: Secondary | ICD-10-CM | POA: Diagnosis not present

## 2018-01-21 DIAGNOSIS — Z09 Encounter for follow-up examination after completed treatment for conditions other than malignant neoplasm: Secondary | ICD-10-CM | POA: Diagnosis not present

## 2018-01-21 DIAGNOSIS — K219 Gastro-esophageal reflux disease without esophagitis: Secondary | ICD-10-CM | POA: Diagnosis not present

## 2018-01-21 MED ORDER — HYDROXYZINE HCL 25 MG PO TABS: 25.0000 mg | ORAL_TABLET | Freq: Three times a day (TID) | ORAL | 0 refills | Status: DC | PRN

## 2018-01-21 NOTE — Progress Notes (Signed)
Subjective:    Patient ID: Kim Buchanan, female    DOB: [birth year], 67 y.o.   MRN: [id number]  Kim Buchanan is a 67 y.o. female presenting on 01/21/2018 for Hospitalization Follow-up (acute encephalopathy likely secondary to accidental vs. unintentional drug overdose anxiety, depression) and Pruritis (all over, pt currently taking 50MG  of benadryl TID)   HPI Hospitalization Followup Hospital/Location: [facility] Date of Admission: 01/04/2018 Date of Discharge: 01/10/2018 Transitions of care telephone call: not performed  Reason for Admission: Altered mental status, encephalopathy, lactic acidosis Secondary Diagnosis: acute respiratory failure - Hospital H&P and Discharge Summary have been reviewed - New medications on discharge: none - Changes to current meds on discharge: STOP guaifenesin  First episode of unresponsiveness occurred on 11/13/2017 with ED visit that was presumed OD occurred at approximately 11 pm when pt was lying on cough.  She became unresponsive, and when her husband tried to stimulate her he states her eyes had rolled back in her head. EMS was called and was still unable to get a response.  She was transported to [facility] and placed on mechanical ventilation.   Second episode of unresponsiveness occurred 01/04/2018 that was presumed OD occurred again at approximately 11 pm, but pt's husband provides explanation similar to a tonic-clonic type seizure or post-ictal agitation with spastic muscle movements.  Again, EMS arrived and was unable to get responsiveness, so she was transported to [facility] ED with subsequent hospitalization requiring mechanical ventilation.  Pt's husband reports she continued to have muscle spasticity and jerking movements in ambulance which was described by hospital staff in notes as being combative.  I am unsure of which report is true. - Pt states she has not taken clonazepam since prior to hospitalization. - Pt does endorse taking amitriptyline of 50 mg at  bedtime instead of prescribed 25 mg at bedtime.  - Pt has not had any neurology workup or EEG performed for evaluation of possible seizure activity  Pruritus Pt has had new itching since hospital discharge.  It is affecting entire body and is not associated with any rash.  She has taken benadryl 50 mg tid and had no relief.   Social History   Tobacco Use  . Smoking status: Current Every Day Smoker    Packs/day: 0.25    Years: 45.00    Pack years: 11.25    Types: Cigarettes  . Smokeless tobacco: Never Used  . Tobacco comment: started at age 43  Substance Use Topics  . Alcohol use: No    Alcohol/week: 0.0 oz  . Drug use: No    Review of Systems Per HPI unless specifically indicated above     Objective:    BP (!) 120/54 (BP Location: Right Arm, Patient Position: Sitting, Cuff Size: Normal)   Pulse 91   Temp 98.4 F (36.9 C) (Oral)   Ht 5\' 5"  (1.651 m)   Wt 177 lb 6.4 oz (80.5 kg)   BMI 29.52 kg/m   Wt Readings from Last 3 Encounters:  01/21/18 177 lb 6.4 oz (80.5 kg)  01/07/18 184 lb 11.9 oz (83.8 kg)  01/01/18 175 lb 9.6 oz (79.7 kg)    Physical Exam  Constitutional: She is oriented to person, place, and time. She appears well-developed and well-nourished. No distress.  Neck: Normal range of motion. Neck supple. Carotid bruit is not present.  Cardiovascular: Normal rate, regular rhythm, S1 normal, S2 normal, normal heart sounds and intact distal pulses.  Pulmonary/Chest: Effort normal and breath sounds normal. No respiratory  distress.  Musculoskeletal: She exhibits no edema (pedal).  Neurological: She is alert and oriented to person, place, and time. She has normal strength and normal reflexes. No cranial nerve deficit or sensory deficit. She displays a negative Romberg sign. Gait normal.  Skin: Skin is warm and dry.  Psychiatric: She has a normal mood and affect. Her behavior is normal. Judgment and thought content normal. She is communicative (participates well in  conversation today).  Vitals reviewed.   Results for orders placed or performed during the hospital encounter of 01/04/18  Blood Culture (routine x 2)  Result Value Ref Range   Specimen Description BLOOD LEFT ANTECUBITAL    Special Requests      BOTTLES DRAWN AEROBIC AND ANAEROBIC Blood Culture adequate volume   Culture      NO GROWTH 5 DAYS Performed at [facility], [address] [place], [place] [address]    Report Status 01/09/2018 FINAL   Blood Culture (routine x 2)  Result Value Ref Range   Specimen Description BLOOD BLOOD RIGHT HAND    Special Requests      BOTTLES DRAWN AEROBIC AND ANAEROBIC Blood Culture adequate volume   Culture      NO GROWTH 5 DAYS Performed at [facility], [address] [place], [place] [address]    Report Status 01/09/2018 FINAL   MRSA PCR Screening  Result Value Ref Range   MRSA by PCR NEGATIVE NEGATIVE  Acetaminophen level  Result Value Ref Range   Acetaminophen (Tylenol), Serum <10 (L) 10 - 30 ug/mL  Comprehensive metabolic panel  Result Value Ref Range   Sodium 142 135 - 145 mmol/L   Potassium 4.5 3.5 - 5.1 mmol/L   Chloride 108 101 - 111 mmol/L   CO2 21 (L) 22 - 32 mmol/L   Glucose, Bld 144 (H) 65 - 99 mg/dL   BUN 18 6 - 20 mg/dL   Creatinine, Ser 0.86 0.44 - 1.00 mg/dL   Calcium 9.2 8.9 - 10.3 mg/dL   Total Protein 7.9 6.5 - 8.1 g/dL   Albumin 4.1 3.5 - 5.0 g/dL   AST 32 15 - 41 U/L   ALT 35 14 - 54 U/L   Alkaline Phosphatase 95 38 - 126 U/L   Total Bilirubin 0.4 0.3 - 1.2 mg/dL   GFR calc non Af Amer >60 >60 mL/min   GFR calc Af Amer >60 >60 mL/min   Anion gap 13 5 - 15  Ethanol  Result Value Ref Range   Alcohol, Ethyl (B) <10 <10 mg/dL  Troponin I  Result Value Ref Range   Troponin I <0.03 <0.03 ng/mL  Lactic acid, plasma  Result Value Ref Range   Lactic Acid, Venous 3.3 (HH) 0.5 - 1.9 mmol/L  Lactic acid, plasma  Result Value Ref Range   Lactic Acid, Venous 1.6 0.5 - 1.9 mmol/L  CBC with  Differential  Result Value Ref Range   WBC 13.3 (H) 3.6 - 11.0 K/uL   RBC 4.49 3.80 - 5.20 MIL/uL   Hemoglobin 13.4 12.0 - 16.0 g/dL   HCT 41.1 35.0 - 47.0 %   MCV 91.5 80.0 - 100.0 fL   MCH 29.9 26.0 - 34.0 pg   MCHC 32.6 32.0 - 36.0 g/dL   RDW 15.3 (H) 11.5 - 14.5 %   Platelets 232 150 - 440 K/uL   Neutrophils Relative % 77 %   Neutro Abs 10.2 (H) 1.4 - 6.5 K/uL   Lymphocytes Relative 17 %  Lymphs Abs 2.3 1.0 - 3.6 K/uL   Monocytes Relative 5 %   Monocytes Absolute 0.7 0.2 - 0.9 K/uL   Eosinophils Relative 1 %   Eosinophils Absolute 0.1 0 - 0.7 K/uL   Basophils Relative 0 %   Basophils Absolute 0.0 0 - 0.1 K/uL  Salicylate level  Result Value Ref Range   Salicylate Lvl <6.2 2.8 - 30.0 mg/dL  Urinalysis, Complete w Microscopic  Result Value Ref Range   Color, Urine YELLOW (A) YELLOW   APPearance CLEAR (A) CLEAR   Specific Gravity, Urine 1.017 1.005 - 1.030   pH 5.0 5.0 - 8.0   Glucose, UA NEGATIVE NEGATIVE mg/dL   Hgb urine dipstick SMALL (A) NEGATIVE   Bilirubin Urine NEGATIVE NEGATIVE   Ketones, ur NEGATIVE NEGATIVE mg/dL   Protein, ur NEGATIVE NEGATIVE mg/dL   Nitrite NEGATIVE NEGATIVE   Leukocytes, UA NEGATIVE NEGATIVE   RBC / HPF 0-5 0 - 5 RBC/hpf   WBC, UA 0-5 0 - 5 WBC/hpf   Bacteria, UA NONE SEEN NONE SEEN   Squamous Epithelial / LPF NONE SEEN NONE SEEN   Mucus PRESENT   Urine Drug Screen, Qualitative  Result Value Ref Range   Tricyclic, Ur Screen POSITIVE (A) NONE DETECTED   Amphetamines, Ur Screen NONE DETECTED NONE DETECTED   MDMA (Ecstasy)Ur Screen NONE DETECTED NONE DETECTED   Cocaine Metabolite,Ur [place] NONE DETECTED NONE DETECTED   Opiate, Ur Screen NONE DETECTED NONE DETECTED   Phencyclidine (PCP) Ur S NONE DETECTED NONE DETECTED   Cannabinoid 50 Ng, Ur [place] NONE DETECTED NONE DETECTED   Barbiturates, Ur Screen NONE DETECTED NONE DETECTED   Benzodiazepine, Ur Scrn POSITIVE (A) NONE DETECTED   Methadone Scn, Ur NONE DETECTED NONE DETECTED  CK  Result  Value Ref Range   Total CK 414 (H) 38 - 234 U/L  Blood gas, venous  Result Value Ref Range   pH, Ven 7.30 7.250 - 7.430   pCO2, Ven 47 44.0 - 60.0 mmHg   pO2, Ven 42.0 32.0 - 45.0 mmHg   Bicarbonate 23.1 20.0 - 28.0 mmol/L   Acid-base deficit 3.6 (H) 0.0 - 2.0 mmol/L   O2 Saturation 71.4 %   Patient temperature 37.0    Collection site LINE    Sample type VENOUS   Ammonia  Result Value Ref Range   Ammonia 27 9 - 35 umol/L  Protime-INR  Result Value Ref Range   Prothrombin Time 12.0 11.4 - 15.2 seconds   INR 0.89   Glucose, capillary  Result Value Ref Range   Glucose-Capillary 136 (H) 65 - 99 mg/dL  Blood gas, arterial  Result Value Ref Range   FIO2 0.40    Delivery systems VENTILATOR    Mode ASSIST CONTROL    VT 450 mL   Peep/cpap 5.0 cm H20   pH, Arterial 7.27 (L) 7.350 - 7.450   pCO2 arterial 51 (H) 32.0 - 48.0 mmHg   pO2, Arterial 71 (L) 83.0 - 108.0 mmHg   Bicarbonate 23.4 20.0 - 28.0 mmol/L   Acid-base deficit 3.8 (H) 0.0 - 2.0 mmol/L   O2 Saturation 91.5 %   Patient temperature 37.0    Collection site LEFT RADIAL    Sample type ARTERIAL DRAW    Allens test (pass/fail) PASS PASS   Mechanical Rate 20   RPR  Result Value Ref Range   RPR Ser Ql Non Reactive Non Reactive  Procalcitonin - Baseline  Result Value Ref Range   Procalcitonin <0.10 ng/mL  HIV antibody (Routine Testing)  Result Value Ref Range   HIV Screen 4th Generation wRfx Non Reactive Non Reactive  CBC  Result Value Ref Range   WBC 10.5 3.6 - 11.0 K/uL   RBC 3.95 3.80 - 5.20 MIL/uL   Hemoglobin 12.0 12.0 - 16.0 g/dL   HCT 36.2 35.0 - 47.0 %   MCV 91.7 80.0 - 100.0 fL   MCH 30.4 26.0 - 34.0 pg   MCHC 33.2 32.0 - 36.0 g/dL   RDW 15.3 (H) 11.5 - 14.5 %   Platelets 203 150 - 440 K/uL  Creatinine, serum  Result Value Ref Range   Creatinine, Ser 0.82 0.44 - 1.00 mg/dL   GFR calc non Af Amer >60 >60 mL/min   GFR calc Af Amer >60 >60 mL/min  Troponin I  Result Value Ref Range   Troponin I <0.03  <0.03 ng/mL  Troponin I  Result Value Ref Range   Troponin I <0.03 <0.03 ng/mL  Troponin I  Result Value Ref Range   Troponin I <0.03 <0.03 ng/mL  Blood gas, arterial  Result Value Ref Range   FIO2 0.40    Delivery systems VENTILATOR    Mode PRESSURE REGULATED VOLUME CONTROL    VT 450 mL   LHR 20 resp/min   Peep/cpap 5.0 cm H20   pH, Arterial 7.31 (L) 7.350 - 7.450   pCO2 arterial 48 32.0 - 48.0 mmHg   pO2, Arterial 115 (H) 83.0 - 108.0 mmHg   Bicarbonate 24.2 20.0 - 28.0 mmol/L   Acid-base deficit 2.4 (H) 0.0 - 2.0 mmol/L   O2 Saturation 98.1 %   Patient temperature 37.0    Collection site RIGHT RADIAL    Sample type ARTERIAL DRAW    Allens test (pass/fail) PASS PASS  Procalcitonin  Result Value Ref Range   Procalcitonin <0.10 ng/mL  Protime-INR  Result Value Ref Range   Prothrombin Time 12.7 11.4 - 15.2 seconds   INR 0.96   APTT  Result Value Ref Range   aPTT 24 24 - 36 seconds  Triglycerides  Result Value Ref Range   Triglycerides 71 <150 mg/dL  CBC  Result Value Ref Range   WBC 7.4 3.6 - 11.0 K/uL   RBC 3.62 (L) 3.80 - 5.20 MIL/uL   Hemoglobin 11.2 (L) 12.0 - 16.0 g/dL   HCT 33.2 (L) 35.0 - 47.0 %   MCV 91.7 80.0 - 100.0 fL   MCH 31.0 26.0 - 34.0 pg   MCHC 33.8 32.0 - 36.0 g/dL   RDW 15.9 (H) 11.5 - 14.5 %   Platelets 196 150 - 440 K/uL  Basic metabolic panel  Result Value Ref Range   Sodium 145 135 - 145 mmol/L   Potassium 3.8 3.5 - 5.1 mmol/L   Chloride 117 (H) 101 - 111 mmol/L   CO2 22 22 - 32 mmol/L   Glucose, Bld 131 (H) 65 - 99 mg/dL   BUN 8 6 - 20 mg/dL   Creatinine, Ser 0.67 0.44 - 1.00 mg/dL   Calcium 7.5 (L) 8.9 - 10.3 mg/dL   GFR calc non Af Amer >60 >60 mL/min   GFR calc Af Amer >60 >60 mL/min   Anion gap 6 5 - 15  Blood gas, arterial  Result Value Ref Range   FIO2 0.40    Delivery systems VENTILATOR    Mode PRESSURE REGULATED VOLUME CONTROL    VT 450 mL   Peep/cpap 5.0 cm H20   pH, Arterial 7.41 7.350 -  7.450   pCO2 arterial 37  32.0 - 48.0 mmHg   pO2, Arterial 108 83.0 - 108.0 mmHg   Bicarbonate 23.5 20.0 - 28.0 mmol/L   Acid-base deficit 0.9 0.0 - 2.0 mmol/L   O2 Saturation 98.2 %   Patient temperature 37.0    Collection site RIGHT RADIAL    Sample type ARTERIAL DRAW    Allens test (pass/fail) PASS PASS   Mechanical Rate 20   Procalcitonin  Result Value Ref Range   Procalcitonin <0.10 ng/mL  Glucose, capillary  Result Value Ref Range   Glucose-Capillary 93 65 - 99 mg/dL  Blood gas, arterial  Result Value Ref Range   FIO2 0.40    Delivery systems VENTILATOR    Mode PRESSURE REGULATED VOLUME CONTROL    VT 450 mL   LHR 20 resp/min   Peep/cpap 5.0 cm H20   pH, Arterial 7.34 (L) 7.350 - 7.450   pCO2 arterial 45 32.0 - 48.0 mmHg   pO2, Arterial 86 83.0 - 108.0 mmHg   Bicarbonate 24.3 20.0 - 28.0 mmol/L   Acid-base deficit 1.6 0.0 - 2.0 mmol/L   O2 Saturation 95.9 %   Patient temperature 37.0    Collection site RIGHT RADIAL    Sample type ARTERIAL DRAW    Allens test (pass/fail) PASS PASS  Procalcitonin  Result Value Ref Range   Procalcitonin <0.10 ng/mL  Phosphorus  Result Value Ref Range   Phosphorus 4.1 2.5 - 4.6 mg/dL  Magnesium  Result Value Ref Range   Magnesium 1.7 1.7 - 2.4 mg/dL  Glucose, capillary  Result Value Ref Range   Glucose-Capillary 110 (H) 65 - 99 mg/dL  Glucose, capillary  Result Value Ref Range   Glucose-Capillary 106 (H) 65 - 99 mg/dL  Glucose, capillary  Result Value Ref Range   Glucose-Capillary 127 (H) 65 - 99 mg/dL  Glucose, capillary  Result Value Ref Range   Glucose-Capillary 121 (H) 65 - 99 mg/dL  Glucose, capillary  Result Value Ref Range   Glucose-Capillary 156 (H) 65 - 99 mg/dL  Glucose, capillary  Result Value Ref Range   Glucose-Capillary 108 (H) 65 - 99 mg/dL  Triglycerides  Result Value Ref Range   Triglycerides 72 <150 mg/dL  Glucose, capillary  Result Value Ref Range   Glucose-Capillary 111 (H) 65 - 99 mg/dL  CBC  Result Value Ref Range    WBC 5.8 3.6 - 11.0 K/uL   RBC 3.37 (L) 3.80 - 5.20 MIL/uL   Hemoglobin 10.4 (L) 12.0 - 16.0 g/dL   HCT 30.5 (L) 35.0 - 47.0 %   MCV 90.4 80.0 - 100.0 fL   MCH 30.7 26.0 - 34.0 pg   MCHC 34.0 32.0 - 36.0 g/dL   RDW 15.2 (H) 11.5 - 14.5 %   Platelets 176 150 - 440 K/uL  Comprehensive metabolic panel  Result Value Ref Range   Sodium 142 135 - 145 mmol/L   Potassium 3.8 3.5 - 5.1 mmol/L   Chloride 109 101 - 111 mmol/L   CO2 26 22 - 32 mmol/L   Glucose, Bld 122 (H) 65 - 99 mg/dL   BUN 13 6 - 20 mg/dL   Creatinine, Ser 0.55 0.44 - 1.00 mg/dL   Calcium 7.9 (L) 8.9 - 10.3 mg/dL   Total Protein 5.8 (L) 6.5 - 8.1 g/dL   Albumin 2.8 (L) 3.5 - 5.0 g/dL   AST 175 (H) 15 - 41 U/L   ALT 137 (H) 14 - 54 U/L   Alkaline  Phosphatase 108 38 - 126 U/L   Total Bilirubin 0.8 0.3 - 1.2 mg/dL   GFR calc non Af Amer >60 >60 mL/min   GFR calc Af Amer >60 >60 mL/min   Anion gap 7 5 - 15  Procalcitonin  Result Value Ref Range   Procalcitonin 0.38 ng/mL  Glucose, capillary  Result Value Ref Range   Glucose-Capillary 96 65 - 99 mg/dL  Glucose, capillary  Result Value Ref Range   Glucose-Capillary 129 (H) 65 - 99 mg/dL  Glucose, capillary  Result Value Ref Range   Glucose-Capillary 117 (H) 65 - 99 mg/dL  Glucose, capillary  Result Value Ref Range   Glucose-Capillary 105 (H) 65 - 99 mg/dL  Glucose, capillary  Result Value Ref Range   Glucose-Capillary 126 (H) 65 - 99 mg/dL  Procalcitonin  Result Value Ref Range   Procalcitonin 0.25 ng/mL      Assessment & Plan:   Problem List Items Addressed This Visit      Other   Panic attack   Relevant Medications   hydrOXYzine (ATARAX/VISTARIL) 25 MG tablet   Generalized anxiety disorder   Relevant Medications   hydrOXYzine (ATARAX/VISTARIL) 25 MG tablet  Patient with chronic anxiety and history of panic disorder.  Pt is currently continued on venlafaxine XR 225 mg once daily.  Pt had been tapered from clonazepam 3 mg per day to 1 mg daily in divided  doses, but pt states she has not continued this since 2 weeks ago which is consistent with medication fills as reviewed in [place].  Pt is beginning to have uncontrolled anxiety with panic again and would like to resume a different medication.  Pt denies SI/HI and has no plans to carry out if they arise.  Plan: 1. START hydroxyzine 25 mg tid for panic/anxiety. 2. Continue venlafaxine XR without changes. 3. Confirmed verbally w/ pt that she is not to take any additional clonazepam.  Will recheck UDS at next visit to confirm negative. 4. Pt encouraged to visit psychiatry for additional management of anxiety.  Pt strongly declines today.  Will require if further events of possible OD occur or if pt is found to have benzos in any future UDS. 5. Followup as scheduled in 2 months.   Other Visit Diagnoses    Hospital discharge follow-up    -  Primary Pt with improvement since hospital discharge and no recurrences of syncope vs intentional or accidental OD.      Encephalopathy     Pt with admission 2/2 encephalopathy.  No neurology eval was completed.  Hospital PCCM provider Dr. Alva Garnet and patient/husband report emphasize that there is an unknown etiology of encephalopathy.  Continue to consider possibility of OD, but continued evaluation to ID possible neurology cause is warranted.  Pt could have had masked seizures with prior benzodiazepine dose of 3 mg daily.    Plan: 1. Referral neurology for additional evaluation of possible seizures and encephalopathy. 2. Continue without any clonazepam. 3. Encouraged pt to only take medications exactly as prescribed including amitriptyline as it could cause symptoms of overdose if taking too much.  Pt verbalizes understanding. 4. Followup 2 months or sooner if needed.   Relevant Orders   Ambulatory referral to Neurology   Pruritus     Unknown etiology of pruritus without rash.  Pt without improvement on benadryl.  Cannot exclude substance abuse vs benzo  withdrawal since onset was similar to time off clonazepam.  Plan: 1. Hydroxyzine as above for anxiety will also likely  improve pruritus. 2. Followup as needed.      Meds ordered this encounter  Medications  . hydrOXYzine (ATARAX/VISTARIL) 25 MG tablet    Sig: Take 1 tablet (25 mg total) by mouth 3 (three) times daily as needed.    Dispense:  30 tablet    Refill:  0    Order Specific Question:   Supervising Provider    Answer:   Olin Hauser [[id number]]   Follow up plan: Return 2 months as scheduled or sooner if needed.  Cassell Smiles, DNP, AGPCNP-BC Adult Gerontology Primary Care Nurse Practitioner [facility] 01/21/2018, 12:55 PM

## 2018-01-21 NOTE — Patient Instructions (Addendum)
[name],   Thank you for coming in to clinic today.  1. STOP all clonazepam. 2. STOP benadryl. 3. START hydroxyzine 25 mg three times daily for panic. 4. CONTINUE amitriptyline 25 mg at bedtime.  5. Referral to neurology for evaluation for possible seizures.  Please schedule a follow-up appointment with Cassell Smiles, AGNP. Return for as scheduled or sooner if needed.  If you have any other questions or concerns, please feel free to call the clinic or send a message through [place]. You may also schedule an earlier appointment if necessary.  You will receive a survey after today's visit either digitally by e-mail or paper by [employer]. Your experiences and feedback matter to [place].  Please respond so we know how we are doing as we provide care for you.   Cassell Smiles, DNP, AGNP-BC Adult Gerontology Nurse Practitioner [place]

## 2018-01-21 NOTE — Patient Outreach (Signed)
[facility]) Care Management  01/21/2018  Kim Buchanan October 04, 1951 [id number]  EMMI- General Discharge RED ON EMMI ALERT Day # 4 Date: 01/16/18 Red Alert Reason: Questions about discharge papers? Yes Transportation to follow-up? no  2nd telephone call to patient.  No answer.  HIPAA compliant voice message left.  Plan: RN CM will wait for patient return call. If no return call will close case.  Jone Baseman, RN, MSN [facility] Care Management Care Management Coordinator Direct Line [phone] Toll Free: [phone]  Fax: [phone]

## 2018-01-22 ENCOUNTER — Ambulatory Visit: Payer: Self-pay

## 2018-01-23 DIAGNOSIS — F411 Generalized anxiety disorder: Secondary | ICD-10-CM | POA: Diagnosis not present

## 2018-01-23 DIAGNOSIS — J441 Chronic obstructive pulmonary disease with (acute) exacerbation: Secondary | ICD-10-CM | POA: Diagnosis not present

## 2018-01-23 DIAGNOSIS — G4730 Sleep apnea, unspecified: Secondary | ICD-10-CM | POA: Diagnosis not present

## 2018-01-23 DIAGNOSIS — F339 Major depressive disorder, recurrent, unspecified: Secondary | ICD-10-CM | POA: Diagnosis not present

## 2018-01-23 DIAGNOSIS — F4310 Post-traumatic stress disorder, unspecified: Secondary | ICD-10-CM | POA: Diagnosis not present

## 2018-01-23 DIAGNOSIS — F17210 Nicotine dependence, cigarettes, uncomplicated: Secondary | ICD-10-CM | POA: Diagnosis not present

## 2018-01-23 DIAGNOSIS — J440 Chronic obstructive pulmonary disease with acute lower respiratory infection: Secondary | ICD-10-CM | POA: Diagnosis not present

## 2018-01-23 DIAGNOSIS — K219 Gastro-esophageal reflux disease without esophagitis: Secondary | ICD-10-CM | POA: Diagnosis not present

## 2018-01-23 DIAGNOSIS — J189 Pneumonia, unspecified organism: Secondary | ICD-10-CM | POA: Diagnosis not present

## 2018-01-28 ENCOUNTER — Telehealth: Payer: Self-pay | Admitting: Nurse Practitioner

## 2018-01-28 NOTE — Telephone Encounter (Signed)
Pt was referred to a neurologist at [facility] but they don't  take her insurance.  Her call back number is [phone]

## 2018-01-28 NOTE — Telephone Encounter (Signed)
Pt can stay off hydroxyzine if she does not prefer to take it, but we will not resume any clonazepam.  Again, as requested at her last visit, I recommend she see psychiatry for her anxiety management.  We will continue followup as scheduled or pt can be seen sooner if needed.

## 2018-01-28 NOTE — Telephone Encounter (Signed)
The pt states she didn't take the hydroxyzine today, because she felt like it was causing her to itch. She notice so far that she have not itched at all today. Please advise. Her next appt with you is 04/04/2018

## 2018-01-29 ENCOUNTER — Telehealth: Payer: Self-pay | Admitting: Nurse Practitioner

## 2018-01-29 ENCOUNTER — Telehealth: Payer: Self-pay

## 2018-01-29 DIAGNOSIS — J441 Chronic obstructive pulmonary disease with (acute) exacerbation: Secondary | ICD-10-CM | POA: Diagnosis not present

## 2018-01-29 DIAGNOSIS — Z9981 Dependence on supplemental oxygen: Secondary | ICD-10-CM | POA: Diagnosis not present

## 2018-01-29 DIAGNOSIS — K219 Gastro-esophageal reflux disease without esophagitis: Secondary | ICD-10-CM | POA: Diagnosis not present

## 2018-01-29 DIAGNOSIS — F411 Generalized anxiety disorder: Secondary | ICD-10-CM | POA: Diagnosis not present

## 2018-01-29 DIAGNOSIS — G4730 Sleep apnea, unspecified: Secondary | ICD-10-CM | POA: Diagnosis not present

## 2018-01-29 DIAGNOSIS — F339 Major depressive disorder, recurrent, unspecified: Secondary | ICD-10-CM | POA: Diagnosis not present

## 2018-01-29 DIAGNOSIS — F17210 Nicotine dependence, cigarettes, uncomplicated: Secondary | ICD-10-CM | POA: Diagnosis not present

## 2018-01-29 DIAGNOSIS — Z8701 Personal history of pneumonia (recurrent): Secondary | ICD-10-CM | POA: Diagnosis not present

## 2018-01-29 DIAGNOSIS — F4310 Post-traumatic stress disorder, unspecified: Secondary | ICD-10-CM | POA: Diagnosis not present

## 2018-01-29 NOTE — Telephone Encounter (Signed)
Attempted to contact pt, no answer. LMOM to return my call. 

## 2018-01-29 NOTE — Telephone Encounter (Signed)
Per pt report at last office visit, she had not taken any clonazepam for 2 weeks prior.  She still needed medication for anxiety, but was itching at that time.  She reported only having itching with taking the hydroxyzine, but cannot be called an allergy as it was happening prior to taking the medication.  Still recommend pt to see psychiatry.  [facility] or [facility] will be appropriate.  Clonazepam will not be resumed.  There are no other alternatives that pt was willing to take.  If I prescribe any additional meds, pt will need appointment.    Also, is possible pt was not truthful about stopping clonazepam and could be going through benzo withdrawal.  If having significant tremor or hallucinations, she may need hospitalization/ED visit.

## 2018-01-29 NOTE — Telephone Encounter (Signed)
Advised Amy as per Cassell Smiles NP.

## 2018-01-29 NOTE — Telephone Encounter (Signed)
Spoke to [place] patient was itchy and has rash before taking hydroxyzine. She can either take hydroxyzine or  go to [facility] or [facility]. Amy Notified.

## 2018-01-29 NOTE — Telephone Encounter (Signed)
Kim Buchanan with [facility] saw pt today, she said the the pt was taking hydroxyzine to replace the discontinued clanazepam.  Pt said she was allergic to hydroxyzine so she stopped taking it.  Now her tremors are bad, having night sweats, feels paranoid and has been hearing voices.  Pt thought something was going to be called in to replace hydroxyzine.  Kim Buchanan's call back number is [phone]

## 2018-01-30 ENCOUNTER — Other Ambulatory Visit: Payer: Self-pay

## 2018-01-30 NOTE — Patient Outreach (Signed)
[facility] Care Management  01/30/2018  Kim Buchanan 1951-08-07 [id number]   Multiple attempts to establish contact with patient without success. No response from letter mailed to patient.   Plan: RN CM will close case at this time.   Jone Baseman, RN, MSN [facility] Care Management Care Management Coordinator Direct Line [phone] Toll Free: [phone]  Fax: [phone]

## 2018-02-05 DIAGNOSIS — F339 Major depressive disorder, recurrent, unspecified: Secondary | ICD-10-CM | POA: Diagnosis not present

## 2018-02-05 DIAGNOSIS — K219 Gastro-esophageal reflux disease without esophagitis: Secondary | ICD-10-CM | POA: Diagnosis not present

## 2018-02-05 DIAGNOSIS — F4310 Post-traumatic stress disorder, unspecified: Secondary | ICD-10-CM | POA: Diagnosis not present

## 2018-02-05 DIAGNOSIS — G4730 Sleep apnea, unspecified: Secondary | ICD-10-CM | POA: Diagnosis not present

## 2018-02-05 DIAGNOSIS — Z9981 Dependence on supplemental oxygen: Secondary | ICD-10-CM | POA: Diagnosis not present

## 2018-02-05 DIAGNOSIS — J441 Chronic obstructive pulmonary disease with (acute) exacerbation: Secondary | ICD-10-CM | POA: Diagnosis not present

## 2018-02-05 DIAGNOSIS — F411 Generalized anxiety disorder: Secondary | ICD-10-CM | POA: Diagnosis not present

## 2018-02-05 DIAGNOSIS — Z8701 Personal history of pneumonia (recurrent): Secondary | ICD-10-CM | POA: Diagnosis not present

## 2018-02-05 DIAGNOSIS — F17210 Nicotine dependence, cigarettes, uncomplicated: Secondary | ICD-10-CM | POA: Diagnosis not present

## 2018-02-10 DIAGNOSIS — G4730 Sleep apnea, unspecified: Secondary | ICD-10-CM | POA: Diagnosis not present

## 2018-02-10 DIAGNOSIS — F4310 Post-traumatic stress disorder, unspecified: Secondary | ICD-10-CM | POA: Diagnosis not present

## 2018-02-10 DIAGNOSIS — F339 Major depressive disorder, recurrent, unspecified: Secondary | ICD-10-CM | POA: Diagnosis not present

## 2018-02-10 DIAGNOSIS — J441 Chronic obstructive pulmonary disease with (acute) exacerbation: Secondary | ICD-10-CM | POA: Diagnosis not present

## 2018-02-10 DIAGNOSIS — Z8701 Personal history of pneumonia (recurrent): Secondary | ICD-10-CM | POA: Diagnosis not present

## 2018-02-10 DIAGNOSIS — Z9981 Dependence on supplemental oxygen: Secondary | ICD-10-CM | POA: Diagnosis not present

## 2018-02-10 DIAGNOSIS — F17210 Nicotine dependence, cigarettes, uncomplicated: Secondary | ICD-10-CM | POA: Diagnosis not present

## 2018-02-10 DIAGNOSIS — K219 Gastro-esophageal reflux disease without esophagitis: Secondary | ICD-10-CM | POA: Diagnosis not present

## 2018-02-10 DIAGNOSIS — F411 Generalized anxiety disorder: Secondary | ICD-10-CM | POA: Diagnosis not present

## 2018-02-11 ENCOUNTER — Telehealth: Payer: Self-pay | Admitting: Nurse Practitioner

## 2018-02-11 NOTE — Telephone Encounter (Signed)
Kim Buchanan with [facility] saw pt yesterday and pt's heart rate has been elebated.  Yesterday her pulse was 97 resting.  Over the weekend it was 116 to 133.  She asked if isordil dosage needed to be adjusted [phone]

## 2018-02-12 NOTE — Telephone Encounter (Signed)
I attempted to contact the pt and her husband several times and no answer. I left a message on the vm to return my call. I also called Amy from home health and f/u with her to inform her of Lauren recommendation. Amy stated she will continue to try to reach out to the patient to get her to call us back and schedule an appt as advised by Lauren.  I will try to contact the pt again.

## 2018-02-12 NOTE — Telephone Encounter (Signed)
Isosorbide mononitrate does not impact heart rate directly.   Pt should have clinic visit if there are new symptoms and regular HR elevations.  She is not currently on medications for HR lowering and we would be looking at starting a new medication or performing additional cardiac workup. - Pt also had concerns about sleep and anxiety management during last phone messages, which could also be readdressed at an appointment.

## 2018-02-13 NOTE — Telephone Encounter (Signed)
Attempted to contact the patient again, no answer. LMOM to return my call.  

## 2018-02-18 DIAGNOSIS — F17210 Nicotine dependence, cigarettes, uncomplicated: Secondary | ICD-10-CM | POA: Diagnosis not present

## 2018-02-18 DIAGNOSIS — J441 Chronic obstructive pulmonary disease with (acute) exacerbation: Secondary | ICD-10-CM | POA: Diagnosis not present

## 2018-02-18 DIAGNOSIS — F4310 Post-traumatic stress disorder, unspecified: Secondary | ICD-10-CM | POA: Diagnosis not present

## 2018-02-18 DIAGNOSIS — F411 Generalized anxiety disorder: Secondary | ICD-10-CM | POA: Diagnosis not present

## 2018-02-18 DIAGNOSIS — Z9981 Dependence on supplemental oxygen: Secondary | ICD-10-CM | POA: Diagnosis not present

## 2018-02-18 DIAGNOSIS — K219 Gastro-esophageal reflux disease without esophagitis: Secondary | ICD-10-CM | POA: Diagnosis not present

## 2018-02-18 DIAGNOSIS — Z8701 Personal history of pneumonia (recurrent): Secondary | ICD-10-CM | POA: Diagnosis not present

## 2018-02-18 DIAGNOSIS — F339 Major depressive disorder, recurrent, unspecified: Secondary | ICD-10-CM | POA: Diagnosis not present

## 2018-02-18 DIAGNOSIS — G4730 Sleep apnea, unspecified: Secondary | ICD-10-CM | POA: Diagnosis not present

## 2018-02-19 DIAGNOSIS — S1015XA Superficial foreign body of throat, initial encounter: Secondary | ICD-10-CM | POA: Diagnosis not present

## 2018-02-19 DIAGNOSIS — J449 Chronic obstructive pulmonary disease, unspecified: Secondary | ICD-10-CM | POA: Diagnosis not present

## 2018-02-19 DIAGNOSIS — F458 Other somatoform disorders: Secondary | ICD-10-CM | POA: Diagnosis not present

## 2018-02-19 DIAGNOSIS — J9602 Acute respiratory failure with hypercapnia: Secondary | ICD-10-CM | POA: Diagnosis not present

## 2018-03-13 ENCOUNTER — Ambulatory Visit (INDEPENDENT_AMBULATORY_CARE_PROVIDER_SITE_OTHER): Payer: Medicare HMO | Admitting: Family Medicine

## 2018-03-13 ENCOUNTER — Telehealth: Payer: Self-pay | Admitting: Family Medicine

## 2018-03-13 ENCOUNTER — Encounter: Payer: Self-pay | Admitting: Family Medicine

## 2018-03-13 VITALS — BP 124/92 | HR 98 | Temp 97.6°F | Resp 20 | Ht 62.0 in | Wt 175.0 lb

## 2018-03-13 DIAGNOSIS — F419 Anxiety disorder, unspecified: Secondary | ICD-10-CM

## 2018-03-13 DIAGNOSIS — F410 Panic disorder [episodic paroxysmal anxiety] without agoraphobia: Secondary | ICD-10-CM

## 2018-03-13 DIAGNOSIS — F17200 Nicotine dependence, unspecified, uncomplicated: Secondary | ICD-10-CM

## 2018-03-13 DIAGNOSIS — J449 Chronic obstructive pulmonary disease, unspecified: Secondary | ICD-10-CM | POA: Insufficient documentation

## 2018-03-13 DIAGNOSIS — T4274XD Poisoning by unspecified antiepileptic and sedative-hypnotic drugs, undetermined, subsequent encounter: Secondary | ICD-10-CM

## 2018-03-13 DIAGNOSIS — Z23 Encounter for immunization: Secondary | ICD-10-CM

## 2018-03-13 DIAGNOSIS — K219 Gastro-esophageal reflux disease without esophagitis: Secondary | ICD-10-CM | POA: Diagnosis not present

## 2018-03-13 DIAGNOSIS — F329 Major depressive disorder, single episode, unspecified: Secondary | ICD-10-CM | POA: Diagnosis not present

## 2018-03-13 NOTE — Assessment & Plan Note (Signed)
History of in 11/10/2017 and 01/08/2018 She adamantly denies any overdose, suspect this was accidental Though she was warned about the risks of benzos, she is still adamant that she would like to take them despite needing to be intubated No sedative medications will be given

## 2018-03-13 NOTE — Assessment & Plan Note (Signed)
Treatment as above for anxiety and depression Avoid any sedative medications and benzos given history of overdose Referral to psychiatry as above Therapy would also be helpful, but patient is very resistant to this

## 2018-03-13 NOTE — Telephone Encounter (Signed)
Pt called she was in earlier today ans was offered a referral to psy.  She declined but now has decided to take the referral.  She said she would prefer a private practice  Pt's call back is [phone]  Thanks teri

## 2018-03-13 NOTE — Telephone Encounter (Signed)
Please review

## 2018-03-13 NOTE — Assessment & Plan Note (Addendum)
Discussed importance of cessation, health benefits of cessation, health risks of continued smoking, and options for medical therapy to help with cessation Though patient is cutting back, seems her husband is the root of this motivation and she is not motivated to quit at this time and she is not interested in any medications to help her quit Continue to address This was a 3 to 5-minute discussion

## 2018-03-13 NOTE — Progress Notes (Signed)
Patient: Kim Buchanan, Female    DOB: [birth year], 67 y.o.   MRN: [id number] Visit Date: 03/13/2018  Today's Provider: Lavon Paganini, MD   I, Martha Clan, CMA, am acting as scribe for Lavon Paganini, MD.  Chief Complaint  Patient presents with  . Establish Care   Subjective:    [place] is a 67 y.o. female who presents today to establish care. She feels poorly.  She reports exercising as much as she can, but is unable to do a lot of strenuous activity. She reports she is sleeping poorly.     She has GAD, and her PCP at [place] D/C her Klonopin TID. Pt tried hydroxyzine 25 mg TID for the anxiety (for less than 1 wk), which worsened the sx. She is requesting a refill of her Klonopin.  Her medical history lists her diagnoses as depression, anxiety, PTSD, and panic attacks.  She reports she is on disability for her anxiety and panic attacks.  She was previously seeing a therapist, but she is no longer.  She has never seen a psychiatrist and does not want to ever see one.  She is taking Effexor which she says helps her mood, but not her anxiety.  She is not willing to change this at this time.  She is very insistent that Klonopin be reinstated.  Review of chart reveals that patient has been hospitalized twice in the last 5 months for unresponsiveness.  The first episode occurred on 11/13/2017 when she was found unresponsive on her couch by her husband.  Per report, when he tried to wake her, her eyes had rolled back in her head.  It was presumed that she had overdosed on her Klonopin at that time.  She did not regain consciousness when transported by EMS and was placed on mechanical ventilation at [facility] for significantly low respiratory rate and severe hypercarbia.  At that time, she was on baclofen, Klonopin, Elavil, and venlafaxine.  They also found her husband's trazodone in her medication bag.  Patient was hospitalized again on 01/04/2018 when she  was again found unresponsive and another presumed overdose had occurred.  Her husband reported tonic-clonic type seizure like activity with postictal spastic movements.  EMS was unable to regain consciousness and she again required mechanical ventilation and intubation during hospitalization.  Patient has not taken clonazepam since last hospitalization in March.  She was offered a psychiatry referral by her PCP at hospital follow-up, which she declined.  She was told at that time that it is not safe to take clonazepam or other similar medications, but she is again insisting that Klonopin be restarted.  OSA: Used to have CPAP, but then was unable to afford it. States that she gets restful sleep without CPAP.  She does not want to use a CPAP machine.  COPD: Seeing Dr. Juanell Fairly [facility]) regularly per report.  She is taking Symbicort.  She denies any recent wheezing, cough, shortness of breath.  She does continue to smoke.  She states that her husband is limiting the number of cigarettes she can have her day.  She is currently smoking 8 cigarettes/day.  She tried Chantix once in the past for 1 week and decided did not work and stopped it.  She is not interested in the medication to help her quit smoking.  GERD: Well controlled with omeprazole.  Denies dysphasia, chest pain, abdominal pain, nausea, vomiting, diarrhea.  Seeing Dr. Humphrey Rolls (Cardiology) for "leaky heart valve".  She is unsure what her actual cardiac diagnosis is.  She does take Imdur.  She denies history of heart attacks or surgery.  She states that she sees Dr. Enzo Bi for her gynecologic needs.  She is status post vaginal hysterectomy and BSO. -----------------------------------------------------------------   Review of Systems  Constitutional: Positive for appetite change, diaphoresis, fatigue and unexpected weight change. Negative for activity change, chills and fever.  HENT: Positive for dental problem, drooling and hearing loss. Negative for  congestion, ear discharge, ear pain, facial swelling, mouth sores, nosebleeds, postnasal drip, rhinorrhea, sinus pressure, sinus pain, sneezing, sore throat, tinnitus, trouble swallowing and voice change.   Eyes: Negative.   Respiratory: Positive for shortness of breath. Negative for apnea, cough, choking, chest tightness, wheezing and stridor.   Cardiovascular: Positive for chest pain. Negative for palpitations and leg swelling.  Gastrointestinal: Positive for abdominal distention and constipation. Negative for abdominal pain, anal bleeding, blood in stool, diarrhea, nausea, rectal pain and vomiting.  Endocrine: Positive for polyphagia. Negative for cold intolerance, heat intolerance, polydipsia and polyuria.  Genitourinary: Negative.   Musculoskeletal: Positive for arthralgias and back pain. Negative for gait problem, joint swelling, myalgias, neck pain and neck stiffness.  Skin: Negative.   Allergic/Immunologic: Negative.   Neurological: Negative.   Hematological: Negative.   Psychiatric/Behavioral: Positive for agitation, confusion and sleep disturbance. Negative for behavioral problems, decreased concentration, dysphoric mood, hallucinations, self-injury and suicidal ideas. The patient is nervous/anxious. The patient is not hyperactive.     Social History      She  reports that she has been smoking cigarettes.  She has a 11.25 pack-year smoking history. She has never used smokeless tobacco. She reports that she does not drink alcohol or use drugs.       Social History   Socioeconomic History  . Marital status: Married    Spouse name: Paediatric nurse  . Number of children: Not on file  . Years of education: Not on file  . Highest education level: Not on file  Occupational History    Employer: DISABLED  Social Needs  . Financial resource strain: Not on file  . Food insecurity:    Worry: Not on file    Inability: Not on file  . Transportation needs:    Medical: Not on file    Non-medical:  Not on file  Tobacco Use  . Smoking status: Current Every Day Smoker    Packs/day: 0.25    Years: 45.00    Pack years: 11.25    Types: Cigarettes  . Smokeless tobacco: Never Used  . Tobacco comment: started at age 38  Substance and Sexual Activity  . Alcohol use: No    Alcohol/week: 0.0 oz  . Drug use: No  . Sexual activity: Not Currently  Lifestyle  . Physical activity:    Days per week: Not on file    Minutes per session: Not on file  . Stress: Not on file  Relationships  . Social connections:    Talks on phone: Not on file    Gets together: Not on file    Attends religious service: Not on file    Active member of club or organization: Not on file    Attends meetings of clubs or organizations: Not on file    Relationship status: Not on file  Other Topics Concern  . Not on file  Social History Narrative   Pt had one son, who died when was 41 YO in a MVA.    Past Medical History:  Diagnosis Date  . Anxiety   . Arthritis    lower back  . COPD (chronic obstructive pulmonary disease) ([place])   . Depression   . GERD (gastroesophageal reflux disease)   . Hyperlipidemia   . Leaky heart valve    sees Dr Humphrey Rolls  . Osteoporosis   . Panic attack   . PTSD (post-traumatic stress disorder)   . Rectal fistula   . Sleep apnea   . Wears dentures    full upper     Patient Active Problem List   Diagnosis Date Noted  . Acute respiratory failure ([place]) 01/04/2018  . Sedative or hypnotic overdose 11/20/2017  . Obtunded 11/13/2017  . Acute respiratory failure with hypercapnia ([place]) 11/13/2017  . Psychophysiological insomnia 11/06/2017  . Generalized anxiety disorder 10/20/2017  . Hyperlipidemia 07/19/2017  . S/P vaginal hysterectomy 12/17/2016  . Vaginal atrophy 10/31/2016  . Hx of colonic polyps   . Benign neoplasm of transverse colon   . Benign neoplasm of descending colon   . Polyp of sigmoid colon   . Difficulty urinating 08/31/2016  . DDD (degenerative disc disease),  lumbar 02/21/2016  . GERD (gastroesophageal reflux disease) 02/14/2016  . Low back pain 02/14/2016  . Prolapsed external hemorrhoids 06/06/2015  . Anxiety and depression 06/06/2015  . Depression, major, recurrent ([place]) 06/06/2015  . Panic attack 06/06/2015  . Compulsive tobacco user syndrome 06/06/2015    Past Surgical History:  Procedure Laterality Date  . ABDOMINAL HYSTERECTOMY  10/2015  . COLONOSCOPY  2014  . COLONOSCOPY WITH PROPOFOL N/A 10/01/2016   Procedure: COLONOSCOPY WITH PROPOFOL;  Surgeon: Lucilla Lame, MD;  Location: [place];  Service: Endoscopy;  Laterality: N/A;  . CYSTOCELE REPAIR N/A 12/17/2016   Procedure: ANTERIOR REPAIR (CYSTOCELE);  Surgeon: Brayton Mars, MD;  Location: [facility];  Service: Gynecology;  Laterality: N/A;  . POLYPECTOMY  10/01/2016   Procedure: POLYPECTOMY;  Surgeon: Lucilla Lame, MD;  Location: [place];  Service: Endoscopy;;  . RECTAL SURGERY  06/28/2015   Rectal prolapse, laparoscopic rectopexy the coldCharmont, MD; [facility]  . TUBAL LIGATION    . VAGINAL HYSTERECTOMY Bilateral 12/17/2016   Procedure: HYSTERECTOMY VAGINAL WITH BILATERAL SALPINGO OOPHERECTOMY;  Surgeon: Brayton Mars, MD;  Location: [facility];  Service: Gynecology;  Laterality: Bilateral;    Family History        Family Status  Relation Name Status  . Mother  Deceased at age 19  . Sister  Alive  . Brother  Alive  . Father  Deceased at age 59  . Sister  Alive  . Brother  Alive  . Brother  Deceased  . Brother  Deceased  . Sister  Deceased       traumatic  . Neg Hx  (Not Specified)        Her family history includes Diabetes in her brother, brother, mother, sister, and sister; Healthy in her sister; Heart disease in her brother and father; Hypertension in her father; Lymphoma in her brother and brother. There is no history of Breast cancer, Ovarian cancer, or Colon cancer.      No Known Allergies   Current Outpatient Medications:  .  aspirin  81 MG tablet, Take 81 mg by mouth daily. , Disp: , Rfl:  .  atorvastatin (LIPITOR) 20 MG tablet, Take 1 tablet (20 mg total) by mouth at bedtime., Disp: 30 tablet, Rfl: 11 .  budesonide-formoterol (SYMBICORT) 160-4.5 MCG/ACT inhaler, Inhale 2 puffs into the lungs 2 (two) times daily. Rinse mouth  after use, Disp: 1 Inhaler, Rfl: 12 .  diphenhydrAMINE (BENADRYL) 25 MG tablet, Take 50 mg by mouth 3 (three) times daily., Disp: , Rfl:  .  isosorbide mononitrate (IMDUR) 30 MG 24 hr tablet, Take 1 tablet (30 mg total) by mouth daily., Disp: 30 tablet, Rfl: 6 .  omeprazole (PRILOSEC) 40 MG capsule, Take 1 capsule (40 mg total) by mouth daily., Disp: 90 capsule, Rfl: 1 .  venlafaxine XR (EFFEXOR-XR) 150 MG 24 hr capsule, Take 1 capsule (150 mg total) by mouth at bedtime., Disp: 90 capsule, Rfl: 1 .  venlafaxine XR (EFFEXOR-XR) 75 MG 24 hr capsule, Take 1 capsule (75 mg total) by mouth at bedtime., Disp: 90 capsule, Rfl: 1   Patient Care Team: Virginia Crews, MD as PCP - General (Family Medicine) Defrancesco, Alanda Slim, MD as Consulting Physician (Obstetrics and Gynecology) [name], OD as Consulting Physician (Optometry) Dionisio David, MD as Consulting Physician (Cardiology)      Objective:   Vitals: BP (!) 124/92 (BP Location: Left Arm, Patient Position: Sitting, Cuff Size: Large)   Pulse 98   Temp 97.6 F (36.4 C) (Oral)   Resp 20   Ht 5\' 2"  (1.575 m)   Wt 175 lb (79.4 kg)   SpO2 99%   BMI 32.01 kg/m    Vitals:   03/13/18 1002  BP: (!) 124/92  Pulse: 98  Resp: 20  Temp: 97.6 F (36.4 C)  TempSrc: Oral  SpO2: 99%  Weight: 175 lb (79.4 kg)  Height: 5\' 2"  (1.575 m)     Physical Exam  Constitutional: She is oriented to person, place, and time. She appears well-developed and well-nourished. No distress.  HENT:  Head: Normocephalic and atraumatic.  Right Ear: Tympanic membrane, external ear and ear canal normal.  Left Ear: Tympanic membrane, external ear and ear canal  normal.  Nose: Nose normal.  Mouth/Throat: Uvula is midline and oropharynx is clear and moist.  Eyes: Pupils are equal, round, and reactive to light. Conjunctivae and EOM are normal. No scleral icterus.  Neck: Neck supple. No thyromegaly present.  Cardiovascular: Normal rate, regular rhythm, normal heart sounds and intact distal pulses.  No murmur heard. Pulmonary/Chest: Effort normal and breath sounds normal. No respiratory distress. She has no wheezes. She has no rales.  Abdominal: Soft. Bowel sounds are normal. She exhibits no distension. There is no tenderness. There is no rebound and no guarding.  Musculoskeletal: She exhibits no edema or deformity.  Lymphadenopathy:    She has no cervical adenopathy.  Neurological: She is alert and oriented to person, place, and time.  Skin: Skin is warm and dry. Capillary refill takes less than 2 seconds. No rash noted.  Psychiatric: Her speech is normal. Thought content normal. Her mood appears anxious. Her affect is labile. She is agitated. She expresses no homicidal and no suicidal ideation.  Gets visibly angry when discussing Klonopin, but does not yell and calms self down  Vitals reviewed.    Depression Screen PHQ 2/9 Scores 03/13/2018 01/01/2018 11/06/2017 10/07/2017  PHQ - 2 Score 3 3 2 6   PHQ- 9 Score 9 14 13 18      Assessment & Plan:    Problem List Items Addressed This Visit      Respiratory   COPD (chronic obstructive pulmonary disease) (HCC)    Chronic, stable No breathing complications at this time Continue Symbicort Also reports being followed by pulmonology        Digestive   GERD (gastroesophageal reflux  disease)    Well-controlled Asymptomatic Continue omeprazole        Other   Anxiety and depression - Primary    Reports that depression is well controlled and she has stable moods  seems that her anxiety is her largest concern about her health today She does not want to discuss her symptoms in detail she is very  insistent on refills for her Klonopin Discussed that given her 2 episodes of possible overdose with unresponsiveness and need for mechanical ventilation and intubation hypercarbic respiratory failure, this is not a medication she should ever take in the future  benzos and other sedative medication should be avoided She should continue Effexor at current dose Offered psychiatry referral to discuss other options for her difficult to manage anxiety, but patient adamantly refuses She does call back to the clinic after leaving from her appointment a referral, so one was placed at that time      Relevant Orders   Ambulatory referral to Psychiatry   Panic attack    Treatment as above for anxiety and depression Avoid any sedative medications and benzos given history of overdose Referral to psychiatry as above Therapy would also be helpful, but patient is very resistant to this      Relevant Orders   Ambulatory referral to Psychiatry   Compulsive tobacco user syndrome    Discussed importance of cessation, health benefits of cessation, health risks of continued smoking, and options for medical therapy to help with cessation Though patient is cutting back, seems her husband is the root of this motivation and she is not motivated to quit at this time and she is not interested in any medications to help her quit Continue to address This was a 3 to 5-minute discussion      Sedative or hypnotic overdose    History of in 11/10/2017 and 01/08/2018 She adamantly denies any overdose, suspect this was accidental Though she was warned about the risks of benzos, she is still adamant that she would like to take them despite needing to be intubated No sedative medications will be given       Other Visit Diagnoses    Need for vaccination against Streptococcus pneumoniae using pneumococcal conjugate vaccine 13       Relevant Orders   Pneumococcal conjugate vaccine 13-valent IM (Completed)      Will send  ROI to Cardiology as it is unclear what her diagnosis is.   Return in about 2 months (around 05/13/2018) for AWV/CPE.   The entirety of the information documented in the History of Present Illness, Review of Systems and Physical Exam were personally obtained by me. Portions of this information were initially documented by Raquel Sarna Ratchford, CMA and reviewed by me for thoroughness and accuracy.    Virginia Crews, MD, MPH [facility] 03/13/2018 12:08 PM

## 2018-03-13 NOTE — Patient Instructions (Signed)

## 2018-03-13 NOTE — Assessment & Plan Note (Signed)
Well-controlled.  Asymptomatic. -Continue omeprazole 

## 2018-03-13 NOTE — Assessment & Plan Note (Addendum)
Chronic, stable No breathing complications at this time Continue Symbicort Also reports being followed by pulmonology

## 2018-03-13 NOTE — Assessment & Plan Note (Signed)
Reports that depression is well controlled and she has stable moods  seems that her anxiety is her largest concern about her health today She does not want to discuss her symptoms in detail she is very insistent on refills for her Klonopin Discussed that given her 2 episodes of possible overdose with unresponsiveness and need for mechanical ventilation and intubation hypercarbic respiratory failure, this is not a medication she should ever take in the future  benzos and other sedative medication should be avoided She should continue Effexor at current dose Offered psychiatry referral to discuss other options for her difficult to manage anxiety, but patient adamantly refuses She does call back to the clinic after leaving from her appointment a referral, so one was placed at that time

## 2018-03-13 NOTE — Telephone Encounter (Signed)
Referral was placed.  Will forward preference to Parke Poisson.   Virginia Crews, MD, MPH [facility] 03/13/2018 12:10 PM

## 2018-03-14 NOTE — Telephone Encounter (Signed)
LMTCB

## 2018-03-14 NOTE — Telephone Encounter (Signed)
Pt advised that someone from [place] will contact her with appointment.She was also given their contact information

## 2018-03-20 DIAGNOSIS — Z8701 Personal history of pneumonia (recurrent): Secondary | ICD-10-CM | POA: Diagnosis not present

## 2018-03-20 DIAGNOSIS — F411 Generalized anxiety disorder: Secondary | ICD-10-CM | POA: Diagnosis not present

## 2018-03-20 DIAGNOSIS — F339 Major depressive disorder, recurrent, unspecified: Secondary | ICD-10-CM | POA: Diagnosis not present

## 2018-03-20 DIAGNOSIS — G4730 Sleep apnea, unspecified: Secondary | ICD-10-CM | POA: Diagnosis not present

## 2018-03-20 DIAGNOSIS — K219 Gastro-esophageal reflux disease without esophagitis: Secondary | ICD-10-CM | POA: Diagnosis not present

## 2018-03-20 DIAGNOSIS — F17210 Nicotine dependence, cigarettes, uncomplicated: Secondary | ICD-10-CM | POA: Diagnosis not present

## 2018-03-20 DIAGNOSIS — J441 Chronic obstructive pulmonary disease with (acute) exacerbation: Secondary | ICD-10-CM | POA: Diagnosis not present

## 2018-03-20 DIAGNOSIS — F4310 Post-traumatic stress disorder, unspecified: Secondary | ICD-10-CM | POA: Diagnosis not present

## 2018-03-20 DIAGNOSIS — Z9981 Dependence on supplemental oxygen: Secondary | ICD-10-CM | POA: Diagnosis not present

## 2018-03-22 DIAGNOSIS — J9602 Acute respiratory failure with hypercapnia: Secondary | ICD-10-CM | POA: Diagnosis not present

## 2018-03-22 DIAGNOSIS — J449 Chronic obstructive pulmonary disease, unspecified: Secondary | ICD-10-CM | POA: Diagnosis not present

## 2018-04-04 ENCOUNTER — Ambulatory Visit: Payer: Self-pay | Admitting: Nurse Practitioner

## 2018-04-21 DIAGNOSIS — J9602 Acute respiratory failure with hypercapnia: Secondary | ICD-10-CM | POA: Diagnosis not present

## 2018-04-21 DIAGNOSIS — J449 Chronic obstructive pulmonary disease, unspecified: Secondary | ICD-10-CM | POA: Diagnosis not present

## 2018-05-14 ENCOUNTER — Ambulatory Visit: Payer: Self-pay

## 2018-05-14 ENCOUNTER — Encounter: Payer: Self-pay | Admitting: Family Medicine

## 2018-05-22 DIAGNOSIS — J9602 Acute respiratory failure with hypercapnia: Secondary | ICD-10-CM | POA: Diagnosis not present

## 2018-05-22 DIAGNOSIS — J449 Chronic obstructive pulmonary disease, unspecified: Secondary | ICD-10-CM | POA: Diagnosis not present

## 2018-06-05 ENCOUNTER — Ambulatory Visit: Payer: Medicare HMO | Attending: Internal Medicine

## 2018-06-05 DIAGNOSIS — J449 Chronic obstructive pulmonary disease, unspecified: Secondary | ICD-10-CM

## 2018-06-05 MED ORDER — ALBUTEROL SULFATE (2.5 MG/3ML) 0.083% IN NEBU
2.5000 mg | INHALATION_SOLUTION | Freq: Once | RESPIRATORY_TRACT | Status: AC
  Administered 2018-06-05: 2.5 mg via RESPIRATORY_TRACT
  Filled 2018-06-05: qty 3

## 2018-06-12 NOTE — Progress Notes (Addendum)
[facility] Consultation      Assessment and Plan:  Asthma with chronic bronchitis.  - Review of PFT shows normal pulmonary function without evidence of obstruction, I suspect her respiratory issues are more secondary to chronic bronchitis with asthma, exacerbated by ongoing smoking.  -Not using Symbicort regularly, with continued dyspnea on exertion, recommended that she use Symbicort 2 puffs twice daily.  Nicotine abuse. -Discussed the importance of smoke cessation, 3 minutes in discussion. She is not ready to quit.    Return in about 6 months (around 12/14/2018).   Date: 06/12/2018  MRN# [id number] Kim Buchanan 04-23-1951   Kim Buchanan is a 67 y.o. old female seen in consultation for chief complaint of:    Chief Complaint  Patient presents with  . COPD    Pt here for f/u with PFT:  . Shortness of Breath    pt has sob with exertion  . Cough    HPI:   The patient is a 67 yo female who was admitted to the hospital for AECOPD and accidental drug overdose requiring intubation, hospitalized from 11/02/17 to 2/1. Last visit she was advised smoking cessation, asked to continue Symbicort, her oxygen was discontinued except for nocturnal oxygen. Since her last visit in the office the patient was admitted to the hospital from 01/04/2018 to 3/22 for acute unintentional drug overdose during which time she was intubated. She continues to have dyspnea on exertion. She is taking symbicort about twice per week, recently started. She thinks it helps. She continues to smoke about half ppd. She is not really thinking about quitting. Her PFT was actually normal today without obstruction.   **PFT 06/05/18>> FVC is 128% protected, FEV1 is 123% predicted, there is no significant improvement bronchodilator.  Flow volume loop is normal.  Ratio is 77%.  TLC is 140%, VC is 128%, DLCO is 108%.  --Overall this shows normal pulmonary functions without evidence of obstructive lung  disease.  **Desat walk 12/16/17>> on RA at rest. Sat 96% and HR 112. Walked 360 feet mild dyspnea with sat 96% and HR 110.  **CBC 02/24/13>>Eos=300.  **CXR 07/19/17>> images personally reviewed; hyperinflation consistent with  COPD  Social Hx:   Social History   Tobacco Use  . Smoking status: Current Every Day Smoker    Packs/day: 0.50    Years: 47.00    Pack years: 23.50    Types: Cigarettes  . Smokeless tobacco: Never Used  . Tobacco comment: started at age 67  Substance Use Topics  . Alcohol use: No    Alcohol/week: 0.0 standard drinks  . Drug use: No   Medication:    Current Outpatient Medications:  .  aspirin 81 MG tablet, Take 81 mg by mouth daily. , Disp: , Rfl:  .  atorvastatin (LIPITOR) 20 MG tablet, Take 1 tablet (20 mg total) by mouth at bedtime., Disp: 30 tablet, Rfl: 11 .  budesonide-formoterol (SYMBICORT) 160-4.5 MCG/ACT inhaler, Inhale 2 puffs into the lungs 2 (two) times daily. Rinse mouth after use, Disp: 1 Inhaler, Rfl: 12 .  diphenhydrAMINE (BENADRYL) 25 MG tablet, Take 50 mg by mouth 3 (three) times daily., Disp: , Rfl:  .  isosorbide mononitrate (IMDUR) 30 MG 24 hr tablet, Take 1 tablet (30 mg total) by mouth daily., Disp: 30 tablet, Rfl: 6 .  omeprazole (PRILOSEC) 40 MG capsule, Take 1 capsule (40 mg total) by mouth daily., Disp: 90 capsule, Rfl: 1 .  venlafaxine XR (EFFEXOR-XR) 150 MG 24 hr capsule,  Take 1 capsule (150 mg total) by mouth at bedtime., Disp: 90 capsule, Rfl: 1 .  venlafaxine XR (EFFEXOR-XR) 75 MG 24 hr capsule, Take 1 capsule (75 mg total) by mouth at bedtime., Disp: 90 capsule, Rfl: 1   Allergies:  Patient has no known allergies.  Review of Systems:  Constitutional: Feels well. Cardiovascular: No chest pain.  Pulmonary: Denies hemoptysis The remainder of systems were reviewed and were found to be negative other than what is documented in the HPI.   Physical Examination:   VS: BP 126/76 (BP Location: Left Arm, Cuff Size: Normal)   Pulse  87   Resp 16   Ht 5\' 2"  (1.575 m)   Wt 184 lb (83.5 kg)   SpO2 96%   BMI 33.65 kg/m   General Appearance: No distress  Neuro:without focal findings, mental status, speech normal, alert and oriented HEENT: PERRLA, EOM intact Pulmonary: No wheezing, No rales  CardiovascularNormal S1,S2.  No m/r/g.  Abdomen: Benign, Soft, non-tender, No masses Renal:  No costovertebral tenderness  GU:  No performed at this time. Endoc: No evident thyromegaly, no signs of acromegaly or Cushing features Skin:   warm, no rashes, no ecchymosis  Extremities: normal, no cyanosis, clubbing.       LABORATORY PANEL:   CBC No results for input(s): WBC, HGB, HCT, PLT in the last 168 hours. ------------------------------------------------------------------------------------------------------------------  Chemistries  No results for input(s): NA, K, CL, CO2, GLUCOSE, BUN, CREATININE, CALCIUM, MG, AST, ALT, ALKPHOS, BILITOT in the last 168 hours.  Invalid input(s): GFRCGP ------------------------------------------------------------------------------------------------------------------  Cardiac Enzymes No results for input(s): TROPONINI in the last 168 hours. ------------------------------------------------------------  RADIOLOGY:  No results found.     Thank  you for the consultation and for allowing [facility] to assist in the care of your patient. Our recommendations are noted above.  Please contact us if we can be of further service.  Marda Stalker, M.D., F.C.C.P.  Board Certified in Internal Medicine, Pulmonary Medicine, [place], and Sleep Medicine.  [facility] Office Number: [phone]   06/12/2018

## 2018-06-13 ENCOUNTER — Ambulatory Visit (INDEPENDENT_AMBULATORY_CARE_PROVIDER_SITE_OTHER): Payer: Medicare HMO | Admitting: Internal Medicine

## 2018-06-13 ENCOUNTER — Encounter: Payer: Self-pay | Admitting: Internal Medicine

## 2018-06-13 VITALS — BP 126/76 | HR 87 | Resp 16 | Ht 62.0 in | Wt 184.0 lb

## 2018-06-13 DIAGNOSIS — Z0000 Encounter for general adult medical examination without abnormal findings: Secondary | ICD-10-CM | POA: Insufficient documentation

## 2018-06-13 DIAGNOSIS — R945 Abnormal results of liver function studies: Secondary | ICD-10-CM | POA: Insufficient documentation

## 2018-06-13 DIAGNOSIS — R079 Chest pain, unspecified: Secondary | ICD-10-CM | POA: Insufficient documentation

## 2018-06-13 DIAGNOSIS — J4550 Severe persistent asthma, uncomplicated: Secondary | ICD-10-CM | POA: Diagnosis not present

## 2018-06-13 DIAGNOSIS — F17210 Nicotine dependence, cigarettes, uncomplicated: Secondary | ICD-10-CM | POA: Diagnosis not present

## 2018-06-13 DIAGNOSIS — I447 Left bundle-branch block, unspecified: Secondary | ICD-10-CM | POA: Insufficient documentation

## 2018-06-13 DIAGNOSIS — R609 Edema, unspecified: Secondary | ICD-10-CM | POA: Insufficient documentation

## 2018-06-13 DIAGNOSIS — N390 Urinary tract infection, site not specified: Secondary | ICD-10-CM | POA: Insufficient documentation

## 2018-06-13 DIAGNOSIS — R600 Localized edema: Secondary | ICD-10-CM | POA: Insufficient documentation

## 2018-06-13 DIAGNOSIS — Z1389 Encounter for screening for other disorder: Secondary | ICD-10-CM | POA: Insufficient documentation

## 2018-06-13 DIAGNOSIS — Z720 Tobacco use: Secondary | ICD-10-CM

## 2018-06-13 DIAGNOSIS — R7989 Other specified abnormal findings of blood chemistry: Secondary | ICD-10-CM | POA: Insufficient documentation

## 2018-06-13 DIAGNOSIS — Z23 Encounter for immunization: Secondary | ICD-10-CM | POA: Insufficient documentation

## 2018-06-13 DIAGNOSIS — E870 Hyperosmolality and hypernatremia: Secondary | ICD-10-CM | POA: Insufficient documentation

## 2018-06-13 NOTE — Patient Instructions (Signed)
Take symbicort 2 puffs twice daily.   --Quitting smoking is the most important thing that you can do for your health.  --Quitting smoking will have greater affect on your health than any medicine that we can give you.

## 2018-06-17 ENCOUNTER — Ambulatory Visit: Payer: Self-pay

## 2018-06-22 DIAGNOSIS — J449 Chronic obstructive pulmonary disease, unspecified: Secondary | ICD-10-CM | POA: Diagnosis not present

## 2018-06-22 DIAGNOSIS — J9602 Acute respiratory failure with hypercapnia: Secondary | ICD-10-CM | POA: Diagnosis not present

## 2018-07-09 ENCOUNTER — Encounter: Payer: Self-pay | Admitting: Family Medicine

## 2018-07-22 DIAGNOSIS — J9602 Acute respiratory failure with hypercapnia: Secondary | ICD-10-CM | POA: Diagnosis not present

## 2018-07-22 DIAGNOSIS — J449 Chronic obstructive pulmonary disease, unspecified: Secondary | ICD-10-CM | POA: Diagnosis not present

## 2018-08-22 DIAGNOSIS — J9602 Acute respiratory failure with hypercapnia: Secondary | ICD-10-CM | POA: Diagnosis not present

## 2018-08-22 DIAGNOSIS — J449 Chronic obstructive pulmonary disease, unspecified: Secondary | ICD-10-CM | POA: Diagnosis not present

## 2018-09-17 ENCOUNTER — Other Ambulatory Visit: Payer: Self-pay

## 2018-09-17 DIAGNOSIS — K219 Gastro-esophageal reflux disease without esophagitis: Secondary | ICD-10-CM

## 2018-09-17 MED ORDER — OMEPRAZOLE 40 MG PO CPDR: 40.0000 mg | DELAYED_RELEASE_CAPSULE | Freq: Every day | ORAL | 1 refills | Status: DC

## 2018-09-21 DIAGNOSIS — J9602 Acute respiratory failure with hypercapnia: Secondary | ICD-10-CM | POA: Diagnosis not present

## 2018-09-21 DIAGNOSIS — J449 Chronic obstructive pulmonary disease, unspecified: Secondary | ICD-10-CM | POA: Diagnosis not present

## 2018-10-03 ENCOUNTER — Encounter: Payer: Self-pay | Admitting: Family Medicine

## 2018-10-03 ENCOUNTER — Ambulatory Visit (INDEPENDENT_AMBULATORY_CARE_PROVIDER_SITE_OTHER): Payer: Medicare HMO | Admitting: Family Medicine

## 2018-10-03 ENCOUNTER — Ambulatory Visit (INDEPENDENT_AMBULATORY_CARE_PROVIDER_SITE_OTHER): Payer: Medicare HMO

## 2018-10-03 VITALS — BP 122/78 | HR 100 | Temp 97.9°F | Ht 62.0 in | Wt 189.8 lb

## 2018-10-03 VITALS — BP 122/78 | HR 100 | Temp 97.9°F | Wt 189.8 lb

## 2018-10-03 DIAGNOSIS — Z780 Asymptomatic menopausal state: Secondary | ICD-10-CM

## 2018-10-03 DIAGNOSIS — F17200 Nicotine dependence, unspecified, uncomplicated: Secondary | ICD-10-CM

## 2018-10-03 DIAGNOSIS — J449 Chronic obstructive pulmonary disease, unspecified: Secondary | ICD-10-CM | POA: Diagnosis not present

## 2018-10-03 DIAGNOSIS — E782 Mixed hyperlipidemia: Secondary | ICD-10-CM

## 2018-10-03 DIAGNOSIS — Z6834 Body mass index (BMI) 34.0-34.9, adult: Secondary | ICD-10-CM | POA: Diagnosis not present

## 2018-10-03 DIAGNOSIS — F419 Anxiety disorder, unspecified: Secondary | ICD-10-CM | POA: Diagnosis not present

## 2018-10-03 DIAGNOSIS — K219 Gastro-esophageal reflux disease without esophagitis: Secondary | ICD-10-CM

## 2018-10-03 DIAGNOSIS — Z1159 Encounter for screening for other viral diseases: Secondary | ICD-10-CM

## 2018-10-03 DIAGNOSIS — R945 Abnormal results of liver function studies: Secondary | ICD-10-CM

## 2018-10-03 DIAGNOSIS — Z0000 Encounter for general adult medical examination without abnormal findings: Secondary | ICD-10-CM

## 2018-10-03 DIAGNOSIS — Z1239 Encounter for other screening for malignant neoplasm of breast: Secondary | ICD-10-CM

## 2018-10-03 DIAGNOSIS — R7989 Other specified abnormal findings of blood chemistry: Secondary | ICD-10-CM

## 2018-10-03 DIAGNOSIS — F3342 Major depressive disorder, recurrent, in full remission: Secondary | ICD-10-CM

## 2018-10-03 DIAGNOSIS — D649 Anemia, unspecified: Secondary | ICD-10-CM

## 2018-10-03 DIAGNOSIS — E669 Obesity, unspecified: Secondary | ICD-10-CM

## 2018-10-03 DIAGNOSIS — Z13820 Encounter for screening for osteoporosis: Secondary | ICD-10-CM

## 2018-10-03 MED ORDER — TOPIRAMATE 50 MG PO TABS: 50.0000 mg | ORAL_TABLET | Freq: Every day | ORAL | 2 refills | Status: DC

## 2018-10-03 NOTE — Progress Notes (Signed)
Patient: Kim Buchanan, Female    DOB: Aug 27, 1951, 67 y.o.   MRN: [id number] Visit Date: 10/03/2018  Today's Provider: Lavon Paganini, MD   Chief Complaint  Patient presents with  . Annual Exam   Subjective:  I, Tiburcio Pea, CMA, am acting as a scribe for Lavon Paganini, MD.   Patient had a AWE with [facility] prior to appointment   Complete Physical Kim Buchanan is a 67 y.o. female. She feels fairly well. She reports exercising lightly. She reports she is sleeping well.  Depression and anxiety: After our last visit, she was upset about stopping Klonopin, but she is now glad that she has.  She states she is doing very well since stopping pain medicines and Klonopin.  She has not seen psychiatry.  She self discontinued her Effexor as well.  She feels like she does not need this at this point.  Obesity: Patient reports that her sister was prescribed phentermine.  She wonders if this would be a good option for her.  She is never tried any medications for weight loss.  She admits that she has binge eating symptoms and regularly overeats.  She does not exercise regularly.  She continues to see Dr. Chancy Milroy for heart condition.  She is taking her Imdur, Lipitor, aspirin with good compliance  Patient has seen Dr. Juanell Fairly - pulmonology for her COPD.  She is taking Symbicort twice daily with good compliance.  She denies any recent exacerbations, shortness of breath, wheezing. -----------------------------------------------------------   Review of Systems  Constitutional: Negative.   HENT: Negative.   Eyes: Negative.   Respiratory: Negative.   Cardiovascular: Negative.   Gastrointestinal: Negative.   Endocrine: Negative.   Genitourinary: Negative.   Musculoskeletal: Negative.   Skin: Negative.   Allergic/Immunologic: Negative.   Neurological: Negative.   Hematological: Negative.   Psychiatric/Behavioral: Negative.     Social History   Socioeconomic History  . Marital  status: Married    Spouse name: Paediatric nurse  . Number of children: 0  . Years of education: Not on file  . Highest education level: 9th grade  Occupational History    Employer: DISABLED    Comment: for panic attacks  Social Needs  . Financial resource strain: Not hard at all  . Food insecurity:    Worry: Never true    Inability: Never true  . Transportation needs:    Medical: No    Non-medical: No  Tobacco Use  . Smoking status: Current Every Day Smoker    Packs/day: 0.50    Years: 47.00    Pack years: 23.50    Types: Cigarettes  . Smokeless tobacco: Never Used  . Tobacco comment: started at age 52  Substance and Sexual Activity  . Alcohol use: No    Alcohol/week: 0.0 standard drinks  . Drug use: No  . Sexual activity: Not Currently  Lifestyle  . Physical activity:    Days per week: 0 days    Minutes per session: 0 min  . Stress: Not at all  Relationships  . Social connections:    Talks on phone: Patient refused    Gets together: Patient refused    Attends religious service: Patient refused    Active member of club or organization: Patient refused    Attends meetings of clubs or organizations: Patient refused    Relationship status: Patient refused  . Intimate partner violence:    Fear of current or ex partner: Patient refused    Emotionally abused:  Patient refused    Physically abused: Patient refused    Forced sexual activity: Patient refused  Other Topics Concern  . Not on file  Social History Narrative   Pt had one son, who died when was 30 YO in a MVA.    Past Medical History:  Diagnosis Date  . Anxiety   . Arthritis    lower back  . COPD (chronic obstructive pulmonary disease) ([place])   . Depression   . GERD (gastroesophageal reflux disease)   . Hyperlipidemia   . Leaky heart valve    sees Dr Humphrey Rolls  . Osteoporosis   . Panic attack   . PTSD (post-traumatic stress disorder)   . Rectal fistula   . Sleep apnea   . Wears dentures    full upper      Patient Active Problem List   Diagnosis Date Noted  . Abnormal LFTs 06/13/2018  . Acute urinary tract infection 06/13/2018  . Central chest pain 06/13/2018  . Edema, peripheral 06/13/2018  . Encounter for general adult medical examination without abnormal findings 06/13/2018  . Flu vaccine need 06/13/2018  . High sodium levels 06/13/2018  . Left bundle branch block 06/13/2018  . Need for vaccination with Comvax 06/13/2018  . Pneumococcal vaccination given 06/13/2018  . Screening for gout 06/13/2018  . COPD (chronic obstructive pulmonary disease) ([place]) 03/13/2018  . Acute respiratory failure ([place]) 01/04/2018  . Sedative or hypnotic overdose 11/20/2017  . Acute respiratory failure with hypercapnia ([place]) 11/13/2017  . Psychophysiological insomnia 11/06/2017  . Combined fat and carbohydrate induced hyperlipemia 07/19/2017  . S/P vaginal hysterectomy 12/17/2016  . Vaginal atrophy 10/31/2016  . Hx of colonic polyps   . Benign neoplasm of transverse colon   . Benign neoplasm of descending colon   . Polyp of sigmoid colon   . Difficulty urinating 08/31/2016  . DDD (degenerative disc disease), lumbar 02/21/2016  . Low back pain 02/14/2016  . Rectal prolapse 07/13/2015  . Prolapsed external hemorrhoids 06/06/2015  . Anxiety 06/06/2015  . Major depressive disorder, recurrent episode ([place]) 06/06/2015  . Panic attack 06/06/2015  . Tobacco dependence syndrome 06/06/2015  . Gastroesophageal reflux disease 06/06/2015  . Mixed anxiety depressive disorder 06/06/2015  . Personal history of tobacco use, presenting hazards to health 09/13/2009    Past Surgical History:  Procedure Laterality Date  . COLONOSCOPY  2014  . COLONOSCOPY WITH PROPOFOL N/A 10/01/2016   Procedure: COLONOSCOPY WITH PROPOFOL;  Surgeon: Lucilla Lame, MD;  Location: [facility];  Service: Endoscopy;  Laterality: N/A;  . CYSTOCELE REPAIR N/A 12/17/2016   Procedure: ANTERIOR REPAIR (CYSTOCELE);  Surgeon: Brayton Mars, MD;  Location: [facility];  Service: Gynecology;  Laterality: N/A;  . POLYPECTOMY  10/01/2016   Procedure: POLYPECTOMY;  Surgeon: Lucilla Lame, MD;  Location: [facility];  Service: Endoscopy;;  . RECTAL SURGERY  06/28/2015   Rectal prolapse, laparoscopic rectopexy the coldCharmont, MD; [facility]  . TUBAL LIGATION    . VAGINAL HYSTERECTOMY Bilateral 12/17/2016   Procedure: HYSTERECTOMY VAGINAL WITH BILATERAL SALPINGO OOPHERECTOMY;  Surgeon: Brayton Mars, MD;  Location: [facility];  Service: Gynecology;  Laterality: Bilateral;    Her family history includes Diabetes in her brother, brother, mother, sister, and sister; Healthy in her sister; Heart disease in her brother and father; Hypertension in her father; Lymphoma in her brother and brother. There is no history of Breast cancer, Ovarian cancer, or Colon cancer.      Current Outpatient Medications:  .  aspirin 81  MG tablet, Take 81 mg by mouth daily. , Disp: , Rfl:  .  atorvastatin (LIPITOR) 20 MG tablet, Take 1 tablet (20 mg total) by mouth at bedtime., Disp: 30 tablet, Rfl: 11 .  budesonide-formoterol (SYMBICORT) 160-4.5 MCG/ACT inhaler, Inhale 2 puffs into the lungs 2 (two) times daily. Rinse mouth after use, Disp: 1 Inhaler, Rfl: 12 .  omeprazole (PRILOSEC) 40 MG capsule, Take 1 capsule (40 mg total) by mouth daily., Disp: 90 capsule, Rfl: 1 .  isosorbide mononitrate (IMDUR) 30 MG 24 hr tablet, Take 1 tablet (30 mg total) by mouth daily. (Patient not taking: Reported on 10/03/2018), Disp: 30 tablet, Rfl: 6 .  venlafaxine XR (EFFEXOR-XR) 150 MG 24 hr capsule, Take 1 capsule (150 mg total) by mouth at bedtime. (Patient not taking: Reported on 10/03/2018), Disp: 90 capsule, Rfl: 1 .  venlafaxine XR (EFFEXOR-XR) 75 MG 24 hr capsule, Take 1 capsule (75 mg total) by mouth at bedtime. (Patient not taking: Reported on 10/03/2018), Disp: 90 capsule, Rfl: 1  Patient Care Team: Virginia Crews, MD as PCP - General (Family  Medicine) [name], OD as Consulting Physician (Optometry) Dionisio David, MD as Consulting Physician (Cardiology) Laverle Hobby, MD as Consulting Physician (Pulmonary Disease)     Objective:   Vitals: BP 122/78 (BP Location: Right Arm, Patient Position: Sitting, Cuff Size: Normal)   Pulse 100   Temp 97.9 F (36.6 C) (Oral)   Wt 189 lb 12.8 oz (86.1 kg)   BMI 34.71 kg/m   Physical Exam Vitals signs reviewed.  Constitutional:      General: She is not in acute distress.    Appearance: Normal appearance. She is well-developed. She is obese. She is not diaphoretic.  HENT:     Head: Normocephalic and atraumatic.     Right Ear: External ear normal.     Left Ear: External ear normal.     Nose: Nose normal.     Mouth/Throat:     Mouth: Mucous membranes are moist.     Pharynx: Oropharynx is clear. No oropharyngeal exudate.  Eyes:     General: No scleral icterus.    Conjunctiva/sclera: Conjunctivae normal.     Pupils: Pupils are equal, round, and reactive to light.  Neck:     Musculoskeletal: Neck supple.     Thyroid: No thyromegaly.  Cardiovascular:     Rate and Rhythm: Normal rate and regular rhythm.     Pulses: Normal pulses.     Heart sounds: Normal heart sounds. No murmur.  Pulmonary:     Effort: Pulmonary effort is normal. No respiratory distress.     Breath sounds: Normal breath sounds. No wheezing or rales.  Abdominal:     General: Bowel sounds are normal. There is no distension.     Palpations: Abdomen is soft.     Tenderness: There is no abdominal tenderness.  Musculoskeletal:        General: No deformity.     Right lower leg: No edema.     Left lower leg: No edema.  Lymphadenopathy:     Cervical: No cervical adenopathy.  Skin:    General: Skin is warm and dry.     Capillary Refill: Capillary refill takes less than 2 seconds.     Findings: No rash.  Neurological:     Mental Status: She is alert and oriented to person, place, and time.      Cranial Nerves: No cranial nerve deficit.  Psychiatric:  Mood and Affect: Mood normal.        Behavior: Behavior normal.        Thought Content: Thought content normal.     Activities of Daily Living In your present state of health, do you have any difficulty performing the following activities: 10/03/2018 03/13/2018  Hearing? Y Y  Comment Deaf in right ear. No hearing aid use. -  Vision? N Y  Comment Wears eye glasses.  -  Difficulty concentrating or making decisions? Tempie Donning  Walking or climbing stairs? N Y  Dressing or bathing? N N  Doing errands, shopping? N Y  Conservation officer, nature and eating ? N -  Using the Toilet? N -  In the past six months, have you accidently leaked urine? N -  Do you have problems with loss of bowel control? N -  Managing your Medications? N -  Managing your Finances? N -  Housekeeping or managing your Housekeeping? N -  Some recent data might be hidden    Fall Risk Assessment Fall Risk  10/03/2018 03/13/2018 09/03/2017 08/15/2017 06/20/2017  Falls in the past year? 0 Yes Yes Yes Yes  Number falls in past yr: - 1 1 1 1   Injury with Fall? - Yes No No Yes  Comment - - - - scraped knee  Risk for fall due to : - - - - Impaired balance/gait  Follow up - Falls evaluation completed - Falls evaluation completed Falls evaluation completed     Depression Screen PHQ 2/9 Scores 10/03/2018 03/13/2018 01/01/2018 11/06/2017  PHQ - 2 Score 1 3 3 2   PHQ- 9 Score - 9 14 13     6CIT Screen 10/03/2018  What Year? 0 points  What month? 0 points  What time? 0 points  Count back from 20 0 points  Months in reverse 0 points  Repeat phrase 0 points  Total Score 0      Assessment & Plan:    Annual Physical Reviewed patient's Family Medical History Reviewed and updated list of patient's medical providers Assessment of cognitive impairment was done Assessed patient's functional ability Established a written schedule for health screening [place] Completed and Reviewed  Exercise Activities and Dietary recommendations Goals    . Quit Smoking     Recommend to continue efforts to reduce smoking habits until no longer smoking.      . Weight (lb) < 150 lb (68 kg)     Recommend cutting out bad carbohydrates in daily diet. Pt to focus on healthy carbs and spreading out servings in diet.        Immunization History  Administered Date(s) Administered  . Influenza, Seasonal, Injecte, Preservative Fre 08/18/2008, 07/14/2014  . Influenza,inj,Quad PF,6+ Mos 09/25/2013, 08/05/2015, 08/02/2016, 07/24/2017  . Pneumococcal Conjugate-13 03/13/2018  . Tdap 08/08/2009    Health Maintenance  Topic Date Due  . Hepatitis C Screening  1951-05-28  . MAMMOGRAM  05/21/2016  . DEXA SCAN  02/10/2018  . PNA vac Low Risk Adult (2 of 2 - PPSV23) 03/14/2019  . TETANUS/TDAP  08/09/2019  . PAP SMEAR-Modifier  11/01/2019  . COLONOSCOPY  10/01/2021  . INFLUENZA VACCINE  Completed  . HIV Screening  Completed     Discussed health benefits of physical activity, and encouraged her to engage in regular exercise appropriate for her age and condition.    ------------------------------------------------------------------------------------------------------------  Problem List Items Addressed This Visit      Respiratory   COPD (chronic obstructive pulmonary disease) ([place])  Chronic and stable No breathing complications at this time No recent exacerbations Continue Symbicort Also reports being followed by pulmonology        Digestive   Gastroesophageal reflux disease    Well-controlled Asymptomatic Continue omeprazole We have discussed the long-term possible side effects of chronic PPI use      Relevant Orders   CBC     Other   Anxiety    Reports that she is well controlled with stable moods and no panic attacks She is off of Effexor and Klonopin She seems to doing much better without controlled substances Of note, patient  has had 2 recent episodes of possible accidental overdose with unresponsiveness and need for mechanical ventilation and intubation due to hypercarbic respiratory failure, so benzos are not an option in the future and would avoid other sedative medications as well Patient is not being followed by psychiatry and does not need this referral at this time Continue to monitor      MDD (major depressive disorder)    Currently well controlled Not on any medications She discontinued Effexor on her own We will continue to monitor Therapy will be helpful in the future if needed      Tobacco dependence syndrome    3 to 5-minute discussion regarding the importance of cessation, health benefits of cessation, health risks of continued smoking, and options for medical therapy to help with cessation Patient is pre-contemplative and not interested in quitting currently Her husband's continues to motivate her to quit We will continue to address in the future      Combined fat and carbohydrate induced hyperlipemia    Patient taking Lipitor 20 mg daily with good compliance without any side effects, which we will continue Check fasting lipid panel and CMP today Medication adjustments to follow as needed      Relevant Orders   Lipid panel   Comprehensive metabolic panel   Abnormal LFTs    Noted to be quite elevated during hospitalization in 12/2017 Recheck CMP today      Relevant Orders   Comprehensive metabolic panel   Class 1 obesity with serious comorbidity and body mass index (BMI) of 34.0 to 34.9 in adult    Long discussion with the patient regarding obesity, importance of healthy weight management, diet, exercise She is not a good candidate for phentermine We will try Topamax instead Start with 50 mg nightly to avoid sedation Can increase to twice daily dosing in the future Follow-up in 1 month Screening labs today      Anemia    Noted during hospitalization in 12/2017 Recheck CBC  today Further investigation to follow if hemoglobin remains low      Relevant Orders   CBC    Other Visit Diagnoses    Encounter for annual physical exam    -  Primary   Screening for breast cancer       Relevant Orders   MM 3D SCREEN BREAST BILATERAL   Postmenopausal       Relevant Orders   DG Bone Density   Screening for osteoporosis       Relevant Orders   DG Bone Density   Need for hepatitis C screening test       Relevant Orders   Hepatitis C Antibody       Return in about 4 weeks (around 10/31/2018) for weight f/u.   The entirety of the information documented in the History of Present Illness, Review of Systems and Physical Exam were  personally obtained by me. Portions of this information were initially documented by Tiburcio Pea, CMA and reviewed by me for thoroughness and accuracy.    Virginia Crews, MD, MPH [facility] 10/03/2018 10:55 AM

## 2018-10-03 NOTE — Assessment & Plan Note (Signed)
Noted during hospitalization in 12/2017 Recheck CBC today Further investigation to follow if hemoglobin remains low

## 2018-10-03 NOTE — Patient Instructions (Signed)
Preventive Care 65 Years and Older, Female Preventive care refers to lifestyle choices and visits with your health care provider that can promote health and wellness. What does preventive care include?  A yearly physical exam. This is also called an annual well check.  Dental exams once or twice a year.  Routine eye exams. Ask your health care provider how often you should have your eyes checked.  Personal lifestyle choices, including: ? Daily care of your teeth and gums. ? Regular physical activity. ? Eating a healthy diet. ? Avoiding tobacco and drug use. ? Limiting alcohol use. ? Practicing safe sex. ? Taking low-dose aspirin every day. ? Taking vitamin and mineral supplements as recommended by your health care provider. What happens during an annual well check? The services and screenings done by your health care provider during your annual well check will depend on your age, overall health, lifestyle risk factors, and family history of disease. Counseling Your health care provider may ask you questions about your:  Alcohol use.  Tobacco use.  Drug use.  Emotional well-being.  Home and relationship well-being.  Sexual activity.  Eating habits.  History of falls.  Memory and ability to understand (cognition).  Work and work environment.  Reproductive health.  Screening You may have the following tests or measurements:  Height, weight, and BMI.  Blood pressure.  Lipid and cholesterol levels. These may be checked every 5 years, or more frequently if you are over 50 years old.  Skin check.  Lung cancer screening. You may have this screening every year starting at age 55 if you have a 30-pack-year history of smoking and currently smoke or have quit within the past 15 years.  Fecal occult blood test (FOBT) of the stool. You may have this test every year starting at age 50.  Flexible sigmoidoscopy or colonoscopy. You may have a sigmoidoscopy every 5 years or  a colonoscopy every 10 years starting at age 50.  Hepatitis C blood test.  Hepatitis B blood test.  Sexually transmitted disease (STD) testing.  Diabetes screening. This is done by checking your blood sugar (glucose) after you have not eaten for a while (fasting). You may have this done every 1-3 years.  Bone density scan. This is done to screen for osteoporosis. You may have this done starting at age 65.  Mammogram. This may be done every 1-2 years. Talk to your health care provider about how often you should have regular mammograms.  Talk with your health care provider about your test results, treatment options, and if necessary, the need for more tests. Vaccines Your health care provider may recommend certain vaccines, such as:  Influenza vaccine. This is recommended every year.  Tetanus, diphtheria, and acellular pertussis (Tdap, Td) vaccine. You may need a Td booster every 10 years.  Varicella vaccine. You may need this if you have not been vaccinated.  Zoster vaccine. You may need this after age 60.  Measles, mumps, and rubella (MMR) vaccine. You may need at least one dose of MMR if you were born in 1957 or later. You may also need a second dose.  Pneumococcal 13-valent conjugate (PCV13) vaccine. One dose is recommended after age 65.  Pneumococcal polysaccharide (PPSV23) vaccine. One dose is recommended after age 65.  Meningococcal vaccine. You may need this if you have certain conditions.  Hepatitis A vaccine. You may need this if you have certain conditions or if you travel or work in places where you may be exposed to hepatitis   A.  Hepatitis B vaccine. You may need this if you have certain conditions or if you travel or work in places where you may be exposed to hepatitis B.  Haemophilus influenzae type b (Hib) vaccine. You may need this if you have certain conditions.  Talk to your health care provider about which screenings and vaccines you need and how often you  need them. This information is not intended to replace advice given to you by your health care provider. Make sure you discuss any questions you have with your health care provider. Document Released: 11/04/2015 Document Revised: 06/27/2016 Document Reviewed: 08/09/2015 Elsevier Interactive Patient Education  2018 [employer] Inc[employer]  

## 2018-10-03 NOTE — Assessment & Plan Note (Signed)
Reports that she is well controlled with stable moods and no panic attacks She is off of Effexor and Klonopin She seems to doing much better without controlled substances Of note, patient has had 2 recent episodes of possible accidental overdose with unresponsiveness and need for mechanical ventilation and intubation due to hypercarbic respiratory failure, so benzos are not an option in the future and would avoid other sedative medications as well Patient is not being followed by psychiatry and does not need this referral at this time Continue to monitor

## 2018-10-03 NOTE — Assessment & Plan Note (Signed)
Currently well controlled Not on any medications She discontinued Effexor on her own We will continue to monitor Therapy will be helpful in the future if needed

## 2018-10-03 NOTE — Assessment & Plan Note (Signed)
Chronic and stable No breathing complications at this time No recent exacerbations Continue Symbicort Also reports being followed by pulmonology

## 2018-10-03 NOTE — Assessment & Plan Note (Signed)
Long discussion with the patient regarding obesity, importance of healthy weight management, diet, exercise She is not a good candidate for phentermine We will try Topamax instead Start with 50 mg nightly to avoid sedation Can increase to twice daily dosing in the future Follow-up in 1 month Screening labs today

## 2018-10-03 NOTE — Assessment & Plan Note (Signed)
Noted to be quite elevated during hospitalization in 12/2017 Recheck CMP today

## 2018-10-03 NOTE — Assessment & Plan Note (Signed)
3 to 5-minute discussion regarding the importance of cessation, health benefits of cessation, health risks of continued smoking, and options for medical therapy to help with cessation Patient is pre-contemplative and not interested in quitting currently Her husband's continues to motivate her to quit We will continue to address in the future

## 2018-10-03 NOTE — Assessment & Plan Note (Signed)
Patient taking Lipitor 20 mg daily with good compliance without any side effects, which we will continue Check fasting lipid panel and CMP today Medication adjustments to follow as needed

## 2018-10-03 NOTE — Progress Notes (Signed)
Subjective:   Kim Buchanan is a 67 y.o. female who presents for Medicare Annual (Subsequent) preventive examination.  Review of Systems:  N/A  Cardiac Risk Factors include: advanced age (>83men, >61 women);dyslipidemia;obesity (BMI >30kg/m2);smoking/ tobacco exposure     Objective:     Vitals: BP 122/78 (BP Location: Right Arm)   Pulse 100   Temp 97.9 F (36.6 C) (Oral)   Ht 5\' 2"  (1.575 m)   Wt 189 lb 12.8 oz (86.1 kg)   BMI 34.71 kg/m   Body mass index is 34.71 kg/m.  Advanced Directives 10/03/2018 01/04/2018 11/14/2017 11/13/2017 08/15/2017 07/19/2017 07/19/2017  Does Patient Have a Medical Advance Directive? Yes No Yes No Yes No Yes  Type of Paramedic of [place];Living will - Living will - Living will - Living will  Does patient want to make changes to medical advance directive? - - No - Patient declined - - - -  Copy of [place] in Chart? No - copy requested - - - - - -  Would patient like information on creating a medical advance directive? - No - Patient declined - No - Patient declined - - -    Tobacco Social History   Tobacco Use  Smoking Status Current Every Day Smoker  . Packs/day: 0.50  . Years: 47.00  . Pack years: 23.50  . Types: Cigarettes  Smokeless Tobacco Never Used  Tobacco Comment   started at age 28     Ready to quit: Not Answered Counseling given: Not Answered Comment: started at age 67   Clinical Intake:  Pre-visit preparation completed: Yes  Pain : No/denies pain Pain Score: 0-No pain     Nutritional Status: BMI > 30  Obese Nutritional Risks: None Diabetes: No  How often do you need to have someone help you when you read instructions, pamphlets, or other written materials from your doctor or pharmacy?: 1 - Never  Interpreter Needed?: No  Information entered by :: [facility], LPN  Past Medical History:  Diagnosis Date  . Anxiety   . Arthritis    lower back  . COPD (chronic  obstructive pulmonary disease) ([place])   . Depression   . GERD (gastroesophageal reflux disease)   . Hyperlipidemia   . Leaky heart valve    sees Dr Humphrey Rolls  . Osteoporosis   . Panic attack   . PTSD (post-traumatic stress disorder)   . Rectal fistula   . Sleep apnea   . Wears dentures    full upper   Past Surgical History:  Procedure Laterality Date  . COLONOSCOPY  2014  . COLONOSCOPY WITH PROPOFOL N/A 10/01/2016   Procedure: COLONOSCOPY WITH PROPOFOL;  Surgeon: Lucilla Lame, MD;  Location: [place];  Service: Endoscopy;  Laterality: N/A;  . CYSTOCELE REPAIR N/A 12/17/2016   Procedure: ANTERIOR REPAIR (CYSTOCELE);  Surgeon: Brayton Mars, MD;  Location: [facility];  Service: Gynecology;  Laterality: N/A;  . POLYPECTOMY  10/01/2016   Procedure: POLYPECTOMY;  Surgeon: Lucilla Lame, MD;  Location: [place];  Service: Endoscopy;;  . RECTAL SURGERY  06/28/2015   Rectal prolapse, laparoscopic rectopexy the coldCharmont, MD; [facility]  . TUBAL LIGATION    . VAGINAL HYSTERECTOMY Bilateral 12/17/2016   Procedure: HYSTERECTOMY VAGINAL WITH BILATERAL SALPINGO OOPHERECTOMY;  Surgeon: Brayton Mars, MD;  Location: [facility];  Service: Gynecology;  Laterality: Bilateral;   Family History  Problem Relation Age of Onset  . Diabetes Mother   . Diabetes Sister   .  Diabetes Brother   . Lymphoma Brother   . Heart disease Father   . Hypertension Father   . Diabetes Sister   . Diabetes Brother   . Lymphoma Brother   . Heart disease Brother   . Healthy Sister   . Breast cancer Neg Hx   . Ovarian cancer Neg Hx   . Colon cancer Neg Hx    Social History   Socioeconomic History  . Marital status: Married    Spouse name: Paediatric nurse  . Number of children: 0  . Years of education: Not on file  . Highest education level: 9th grade  Occupational History    Employer: DISABLED    Comment: for panic attacks  Social Needs  . Financial resource strain: Not hard at all  . Food  insecurity:    Worry: Never true    Inability: Never true  . Transportation needs:    Medical: No    Non-medical: No  Tobacco Use  . Smoking status: Current Every Day Smoker    Packs/day: 0.50    Years: 47.00    Pack years: 23.50    Types: Cigarettes  . Smokeless tobacco: Never Used  . Tobacco comment: started at age 31  Substance and Sexual Activity  . Alcohol use: No    Alcohol/week: 0.0 standard drinks  . Drug use: No  . Sexual activity: Not Currently  Lifestyle  . Physical activity:    Days per week: 0 days    Minutes per session: 0 min  . Stress: Not at all  Relationships  . Social connections:    Talks on phone: Patient refused    Gets together: Patient refused    Attends religious service: Patient refused    Active member of club or organization: Patient refused    Attends meetings of clubs or organizations: Patient refused    Relationship status: Patient refused  Other Topics Concern  . Not on file  Social History Narrative   Pt had one son, who died when was 22 YO in a MVA.    Outpatient Encounter Medications as of 10/03/2018  Medication Sig  . aspirin 81 MG tablet Take 81 mg by mouth daily.   Marland Kitchen atorvastatin (LIPITOR) 20 MG tablet Take 1 tablet (20 mg total) by mouth at bedtime.  . budesonide-formoterol (SYMBICORT) 160-4.5 MCG/ACT inhaler Inhale 2 puffs into the lungs 2 (two) times daily. Rinse mouth after use  . omeprazole (PRILOSEC) 40 MG capsule Take 1 capsule (40 mg total) by mouth daily.  . isosorbide mononitrate (IMDUR) 30 MG 24 hr tablet Take 1 tablet (30 mg total) by mouth daily. (Patient not taking: Reported on 10/03/2018)  . venlafaxine XR (EFFEXOR-XR) 150 MG 24 hr capsule Take 1 capsule (150 mg total) by mouth at bedtime. (Patient not taking: Reported on 10/03/2018)  . venlafaxine XR (EFFEXOR-XR) 75 MG 24 hr capsule Take 1 capsule (75 mg total) by mouth at bedtime. (Patient not taking: Reported on 10/03/2018)   No facility-administered encounter  medications on file as of 10/03/2018.     Activities of Daily Living In your present state of health, do you have any difficulty performing the following activities: 10/03/2018 03/13/2018  Hearing? Y Y  Comment Deaf in right ear. No hearing aid use. -  Vision? N Y  Comment Wears eye glasses.  -  Difficulty concentrating or making decisions? Tempie Donning  Walking or climbing stairs? N Y  Dressing or bathing? N N  Doing errands, shopping? Aggie Moats  Preparing  Food and eating ? N -  Using the Toilet? N -  In the past six months, have you accidently leaked urine? N -  Do you have problems with loss of bowel control? N -  Managing your Medications? N -  Managing your Finances? N -  Housekeeping or managing your Housekeeping? N -  Some recent data might be hidden    Patient Care Team: Virginia Crews, MD as PCP - General (Family Medicine) [name], OD as Consulting Physician (Optometry) Dionisio David, MD as Consulting Physician (Cardiology) Laverle Hobby, MD as Consulting Physician (Pulmonary Disease)    Assessment:   This is a routine wellness examination for [place].  Exercise Activities and Dietary recommendations Current Exercise Habits: The patient does not participate in regular exercise at present, Exercise limited by: None identified  Goals    . Quit Smoking     Recommend to continue efforts to reduce smoking habits until no longer smoking.      . Weight (lb) < 150 lb (68 kg)     Recommend cutting out bad carbohydrates in daily diet. Pt to focus on healthy carbs and spreading out servings in diet.        Fall Risk Fall Risk  10/03/2018 03/13/2018 09/03/2017 08/15/2017 06/20/2017  Falls in the past year? 0 Yes Yes Yes Yes  Number falls in past yr: - 1 1 1 1   Injury with Fall? - Yes No No Yes  Comment - - - - scraped knee  Risk for fall due to : - - - - Impaired balance/gait  Follow up - Falls evaluation completed - Falls evaluation completed Falls evaluation  completed   FALL RISK PREVENTION PERTAINING TO THE HOME:  Any stairs in or around the home WITH handrails? No  Home free of loose throw rugs in walkways, pet beds, electrical cords, etc? Yes  Adequate lighting in your home to reduce risk of falls? Yes   ASSISTIVE DEVICES UTILIZED TO PREVENT FALLS:  Life alert? No  Use of a cane, walker or w/c? Yes  Grab bars in the bathroom? Yes  Shower chair or bench in shower? No  Elevated toilet seat or a handicapped toilet? Yes    TIMED UP AND GO:  Was the test performed? No .    Depression Screen PHQ 2/9 Scores 10/03/2018 03/13/2018 01/01/2018 11/06/2017  PHQ - 2 Score 1 3 3 2   PHQ- 9 Score - 9 14 13      Cognitive Function     6CIT Screen 10/03/2018 05/13/2017  What Year? 0 points 0 points  What month? 0 points 0 points  What time? 0 points 0 points  Count back from 20 0 points 0 points  Months in reverse 0 points 0 points  Repeat phrase 0 points 2 points  Total Score 0 2    Immunization History  Administered Date(s) Administered  . Influenza, Seasonal, Injecte, Preservative Fre 08/18/2008, 07/14/2014  . Influenza,inj,Quad PF,6+ Mos 09/25/2013, 08/05/2015, 08/02/2016, 07/24/2017  . Pneumococcal Conjugate-13 03/13/2018  . Tdap 08/08/2009    Qualifies for Shingles Vaccine? Yes . Due for Shingrix. Education has been provided regarding the importance of this vaccine. Pt has been advised to call insurance company to determine out of pocket expense. Advised may also receive vaccine at local pharmacy or Health Dept. Verbalized acceptance and understanding.  Tdap: Up to date  Flu Vaccine: Up to date  Pneumococcal Vaccine: Up to date   Screening Tests Health Maintenance  Topic Date Due  .  Hepatitis C Screening  08/05/1951  . MAMMOGRAM  05/21/2016  . DEXA SCAN  02/10/2018  . PNA vac Low Risk Adult (2 of 2 - PPSV23) 03/14/2019  . TETANUS/TDAP  08/09/2019  . PAP SMEAR-Modifier  11/01/2019  . COLONOSCOPY  10/01/2021  .  INFLUENZA VACCINE  Completed  . HIV Screening  Completed    Cancer Screenings:  Colorectal Screening: Completed 10/01/16. Repeat every 5 years..  Mammogram: Completed 07/02/14. Repeat every year; Ordered for 10/07/17.   Bone Density: Completed 02/25/07. Pt declined a referral today.  Lung Cancer Screening: (Low Dose CT Chest recommended if Age 60-80 years, 30 pack-year currently smoking OR have quit w/in 15years.) does qualify however order today.    Additional Screening:  Hepatitis C Screening: does qualify however declines order today.   Vision Screening: Recommended annual ophthalmology exams for early detection of glaucoma and other disorders of the eye.  Dental Screening: Recommended annual dental exams for proper oral hygiene  Community Resource Referral:  CRR required this visit?  No       Plan:  I have personally reviewed and addressed the Medicare Annual Wellness questionnaire and have noted the following in the patient's chart:  A. Medical and social history B. Use of alcohol, tobacco or illicit drugs  C. Current medications and supplements D. Functional ability and status E.  Nutritional status F.  Physical activity G. Advance directives H. List of other physicians I.  Hospitalizations, surgeries, and ER visits in previous 12 months J.  [place] such as hearing and vision if needed, cognitive and depression L. Referrals and appointments - none  In addition, I have reviewed and discussed with patient certain preventive protocols, quality metrics, and best practice recommendations. A written personalized care plan for preventive services as well as general preventive health recommendations were provided to patient.  See attached scanned questionnaire for additional information.   Signed,  Fabio Neighbors, LPN Nurse Health Advisor   Nurse Recommendations: Pt declined the Hep C lab, DEXA referral and CT scan order today.

## 2018-10-03 NOTE — Assessment & Plan Note (Signed)
Well-controlled Asymptomatic Continue omeprazole We have discussed the long-term possible side effects of chronic PPI use

## 2018-10-03 NOTE — Patient Instructions (Signed)
[name] , Thank you for taking time to come for your Medicare Wellness Visit. I appreciate your ongoing commitment to your health goals. Please review the following plan we discussed and let me know if I can assist you in the future.   Screening recommendations/referrals: Colonoscopy: Up to date, due 09/2021 Mammogram: Scheduled for 10/07/18 Bone Density: Pt declines order today.  Recommended yearly ophthalmology/optometry visit for glaucoma screening and checkup Recommended yearly dental visit for hygiene and checkup  Vaccinations: Influenza vaccine: Up to date Pneumococcal vaccine: Up to date Tdap vaccine: Up to date, due 04/2024 Shingles vaccine: Pt declines today.     Advanced directives: Please bring a copy of your POA (Power of Attorney) and/or Living Will to your next appointment.   Conditions/risks identified: Smoking cessation; Obesity  Next appointment: 10:00 AM today with [name]   Preventive Care 45 Years and Older, Female Preventive care refers to lifestyle choices and visits with your health care provider that can promote health and wellness. What does preventive care include?  A yearly physical exam. This is also called an annual well check.  Dental exams once or twice a year.  Routine eye exams. Ask your health care provider how often you should have your eyes checked.  Personal lifestyle choices, including:  Daily care of your teeth and gums.  Regular physical activity.  Eating a healthy diet.  Avoiding tobacco and drug use.  Limiting alcohol use.  Practicing safe sex.  Taking low-dose aspirin every day.  Taking vitamin and mineral supplements as recommended by your health care provider. What happens during an annual well check? The services and screenings done by your health care provider during your annual well check will depend on your age, overall health, lifestyle risk factors, and family history of disease. Counseling  Your health  care provider may ask you questions about your:  Alcohol use.  Tobacco use.  Drug use.  Emotional well-being.  Home and relationship well-being.  Sexual activity.  Eating habits.  History of falls.  Memory and ability to understand (cognition).  Work and work Statistician.  Reproductive health. Screening  You may have the following tests or measurements:  Height, weight, and BMI.  Blood pressure.  Lipid and cholesterol levels. These may be checked every 5 years, or more frequently if you are over 29 years old.  Skin check.  Lung cancer screening. You may have this screening every year starting at age 88 if you have a 30-pack-year history of smoking and currently smoke or have quit within the past 15 years.  Fecal occult blood test (FOBT) of the stool. You may have this test every year starting at age 77.  Flexible sigmoidoscopy or colonoscopy. You may have a sigmoidoscopy every 5 years or a colonoscopy every 10 years starting at age 49.  Hepatitis C blood test.  Hepatitis B blood test.  Sexually transmitted disease (STD) testing.  Diabetes screening. This is done by checking your blood sugar (glucose) after you have not eaten for a while (fasting). You may have this done every 1-3 years.  Bone density scan. This is done to screen for osteoporosis. You may have this done starting at age 47.  Mammogram. This may be done every 1-2 years. Talk to your health care provider about how often you should have regular mammograms. Talk with your health care provider about your test results, treatment options, and if necessary, the need for more tests. Vaccines  Your health care provider may recommend certain vaccines,  such as:  Influenza vaccine. This is recommended every year.  Tetanus, diphtheria, and acellular pertussis (Tdap, Td) vaccine. You may need a Td booster every 10 years.  Zoster vaccine. You may need this after age 33.  Pneumococcal 13-valent conjugate  (PCV13) vaccine. One dose is recommended after age 21.  Pneumococcal polysaccharide (PPSV23) vaccine. One dose is recommended after age 107. Talk to your health care provider about which screenings and vaccines you need and how often you need them. This information is not intended to replace advice given to you by your health care provider. Make sure you discuss any questions you have with your health care provider. Document Released: 11/04/2015 Document Revised: 06/27/2016 Document Reviewed: 08/09/2015 Elsevier Interactive Patient Education  2017 [place] Prevention in the Home Falls can cause injuries. They can happen to people of all ages. There are many things you can do to make your home safe and to help prevent falls. What can I do on the outside of my home?  Regularly fix the edges of walkways and driveways and fix any cracks.  Remove anything that might make you trip as you walk through a door, such as a raised step or threshold.  Trim any bushes or trees on the path to your home.  Use bright outdoor lighting.  Clear any walking paths of anything that might make someone trip, such as rocks or tools.  Regularly check to see if handrails are loose or broken. Make sure that both sides of any steps have handrails.  Any raised decks and porches should have guardrails on the edges.  Have any leaves, snow, or ice cleared regularly.  Use sand or salt on walking paths during winter.  Clean up any spills in your garage right away. This includes oil or grease spills. What can I do in the bathroom?  Use night lights.  Install grab bars by the toilet and in the tub and shower. Do not use towel bars as grab bars.  Use non-skid mats or decals in the tub or shower.  If you need to sit down in the shower, use a plastic, non-slip stool.  Keep the floor dry. Clean up any water that spills on the floor as soon as it happens.  Remove soap buildup in the tub or shower  regularly.  Attach bath mats securely with double-sided non-slip rug tape.  Do not have throw rugs and other things on the floor that can make you trip. What can I do in the bedroom?  Use night lights.  Make sure that you have a light by your bed that is easy to reach.  Do not use any sheets or blankets that are too big for your bed. They should not hang down onto the floor.  Have a firm chair that has side arms. You can use this for support while you get dressed.  Do not have throw rugs and other things on the floor that can make you trip. What can I do in the kitchen?  Clean up any spills right away.  Avoid walking on wet floors.  Keep items that you use a lot in easy-to-reach places.  If you need to reach something above you, use a strong step stool that has a grab bar.  Keep electrical cords out of the way.  Do not use floor polish or wax that makes floors slippery. If you must use wax, use non-skid floor wax.  Do not have throw rugs and other things on  the floor that can make you trip. What can I do with my stairs?  Do not leave any items on the stairs.  Make sure that there are handrails on both sides of the stairs and use them. Fix handrails that are broken or loose. Make sure that handrails are as long as the stairways.  Check any carpeting to make sure that it is firmly attached to the stairs. Fix any carpet that is loose or worn.  Avoid having throw rugs at the top or bottom of the stairs. If you do have throw rugs, attach them to the floor with carpet tape.  Make sure that you have a light switch at the top of the stairs and the bottom of the stairs. If you do not have them, ask someone to add them for you. What else can I do to help prevent falls?  Wear shoes that:  Do not have high heels.  Have rubber bottoms.  Are comfortable and fit you well.  Are closed at the toe. Do not wear sandals.  If you use a stepladder:  Make sure that it is fully  opened. Do not climb a closed stepladder.  Make sure that both sides of the stepladder are locked into place.  Ask someone to hold it for you, if possible.  Clearly mark and make sure that you can see:  Any grab bars or handrails.  First and last steps.  Where the edge of each step is.  Use tools that help you move around (mobility aids) if they are needed. These include:  Canes.  Walkers.  Scooters.  Crutches.  Turn on the lights when you go into a dark area. Replace any light bulbs as soon as they burn out.  Set up your furniture so you have a clear path. Avoid moving your furniture around.  If any of your floors are uneven, fix them.  If there are any pets around you, be aware of where they are.  Review your medicines with your doctor. Some medicines can make you feel dizzy. This can increase your chance of falling. Ask your doctor what other things that you can do to help prevent falls. This information is not intended to replace advice given to you by your health care provider. Make sure you discuss any questions you have with your health care provider. Document Released: 08/04/2009 Document Revised: 03/15/2016 Document Reviewed: 11/12/2014 Elsevier Interactive Patient Education  2017 [employer].

## 2018-10-04 LAB — LIPID PANEL
Chol/HDL Ratio: 1.8 ratio (ref 0.0–4.4)
Cholesterol, Total: 80 mg/dL — ABNORMAL LOW (ref 100–199)
HDL: 44 mg/dL (ref >39)
LDL Calculated: 15 mg/dL (ref 0–99)
Triglycerides: 106 mg/dL (ref 0–149)
VLDL Cholesterol Cal: 21 mg/dL (ref 5–40)

## 2018-10-04 LAB — COMPREHENSIVE METABOLIC PANEL
ALT: 23 IU/L (ref 0–32)
AST: 17 IU/L (ref 0–40)
Albumin/Globulin Ratio: 1.8 (ref 1.2–2.2)
Albumin: 4.6 g/dL (ref 3.6–4.8)
Alkaline Phosphatase: 107 IU/L (ref 39–117)
BUN/Creatinine Ratio: 27 (ref 12–28)
BUN: 25 mg/dL (ref 8–27)
Bilirubin Total: 0.6 mg/dL (ref 0.0–1.2)
CO2: 21 mmol/L (ref 20–29)
Calcium: 9.7 mg/dL (ref 8.7–10.3)
Chloride: 103 mmol/L (ref 96–106)
Creatinine, Ser: 0.94 mg/dL (ref 0.57–1.00)
GFR calc Af Amer: 74 mL/min/{1.73_m2} (ref >59)
GFR calc non Af Amer: 64 mL/min/{1.73_m2} (ref >59)
Globulin, Total: 2.5 g/dL (ref 1.5–4.5)
Glucose: 104 mg/dL — ABNORMAL HIGH (ref 65–99)
Potassium: 4.3 mmol/L (ref 3.5–5.2)
Sodium: 143 mmol/L (ref 134–144)
Total Protein: 7.1 g/dL (ref 6.0–8.5)

## 2018-10-04 LAB — CBC
Hematocrit: 43.5 % (ref 34.0–46.6)
Hemoglobin: 14.2 g/dL (ref 11.1–15.9)
MCH: 28.3 pg (ref 26.6–33.0)
MCHC: 32.6 g/dL (ref 31.5–35.7)
MCV: 87 fL (ref 79–97)
Platelets: 314 10*3/uL (ref 150–450)
RBC: 5.02 x10E6/uL (ref 3.77–5.28)
RDW: 13.5 % (ref 12.3–15.4)
WBC: 9 10*3/uL (ref 3.4–10.8)

## 2018-10-04 LAB — HEPATITIS C ANTIBODY: Hep C Virus Ab: 2.9 s/co ratio — ABNORMAL HIGH (ref 0.0–0.9)

## 2018-10-06 ENCOUNTER — Telehealth: Payer: Self-pay

## 2018-10-06 NOTE — Telephone Encounter (Signed)
-----   Message from Virginia Crews, MD sent at 10/06/2018  9:48 AM EST ----- Good cholesterol, kidney function, liver function, electrolytes, Blood count. Hep C screening is positive.  We will need to get another test to confirm this.  Need to have lab add on confirmatory test (HCV RNA quant reflex)

## 2018-10-06 NOTE — Telephone Encounter (Signed)
Patient was advised and lab was added on.

## 2018-10-07 ENCOUNTER — Other Ambulatory Visit: Payer: Self-pay

## 2018-10-07 DIAGNOSIS — R768 Other specified abnormal immunological findings in serum: Secondary | ICD-10-CM

## 2018-10-08 DIAGNOSIS — R768 Other specified abnormal immunological findings in serum: Secondary | ICD-10-CM | POA: Diagnosis not present

## 2018-10-10 ENCOUNTER — Telehealth: Payer: Self-pay

## 2018-10-10 LAB — HCV RNA NAA QUAL RFX TO QUANT: HCV RNA NAA Qualitative: NEGATIVE

## 2018-10-10 NOTE — Telephone Encounter (Signed)
-----   Message from Virginia Crews, MD sent at 10/10/2018 10:51 AM EST ----- Confirmatory testing negative.  No Hep C

## 2018-10-10 NOTE — Telephone Encounter (Signed)
Advised patient of results.  

## 2018-10-10 NOTE — Telephone Encounter (Signed)
LMTCB

## 2018-10-23 ENCOUNTER — Other Ambulatory Visit: Payer: Self-pay | Admitting: Family Medicine

## 2018-10-23 DIAGNOSIS — E782 Mixed hyperlipidemia: Secondary | ICD-10-CM

## 2018-10-23 DIAGNOSIS — K219 Gastro-esophageal reflux disease without esophagitis: Secondary | ICD-10-CM

## 2018-10-23 MED ORDER — TOPIRAMATE 50 MG PO TABS: 50.0000 mg | ORAL_TABLET | Freq: Every day | ORAL | 2 refills | Status: DC

## 2018-10-23 MED ORDER — ATORVASTATIN CALCIUM 20 MG PO TABS
20.0000 mg | ORAL_TABLET | Freq: Every day | ORAL | 1 refills | Status: DC
Start: 2018-10-23 — End: 2019-03-19

## 2018-10-23 MED ORDER — OMEPRAZOLE 40 MG PO CPDR: 40.0000 mg | DELAYED_RELEASE_CAPSULE | Freq: Every day | ORAL | 1 refills | Status: DC

## 2018-10-23 NOTE — Telephone Encounter (Signed)
[facility] faxed refill request for the following medications:  atorvastatin (LIPITOR) 20 MG tablet  omeprazole (PRILOSEC) 40 MG capsule  topiramate (TOPAMAX) 50 MG tablet   Please advise.

## 2018-10-29 ENCOUNTER — Other Ambulatory Visit: Payer: Self-pay | Admitting: Family Medicine

## 2018-10-29 DIAGNOSIS — I25118 Atherosclerotic heart disease of native coronary artery with other forms of angina pectoris: Secondary | ICD-10-CM

## 2018-10-29 NOTE — Telephone Encounter (Signed)
[facility] faxed refill request for the following medications:  isosorbide mononitrate (IMDUR) 30 MG 24 hr tablet    Please advise.

## 2018-10-30 MED ORDER — ISOSORBIDE MONONITRATE ER 30 MG PO TB24: 30.0000 mg | ORAL_TABLET | Freq: Every day | ORAL | 3 refills | Status: DC

## 2018-10-31 ENCOUNTER — Ambulatory Visit (INDEPENDENT_AMBULATORY_CARE_PROVIDER_SITE_OTHER): Payer: Medicare Other | Admitting: Family Medicine

## 2018-10-31 ENCOUNTER — Encounter: Payer: Self-pay | Admitting: Family Medicine

## 2018-10-31 VITALS — BP 115/76 | HR 91 | Temp 97.8°F | Resp 16 | Wt 190.2 lb

## 2018-10-31 DIAGNOSIS — R632 Polyphagia: Secondary | ICD-10-CM | POA: Diagnosis not present

## 2018-10-31 DIAGNOSIS — E669 Obesity, unspecified: Secondary | ICD-10-CM

## 2018-10-31 DIAGNOSIS — Z6834 Body mass index (BMI) 34.0-34.9, adult: Secondary | ICD-10-CM

## 2018-10-31 MED ORDER — TOPIRAMATE 100 MG PO TABS: 100.0000 mg | ORAL_TABLET | Freq: Two times a day (BID) | ORAL | 1 refills | Status: DC

## 2018-10-31 NOTE — Patient Instructions (Signed)
Increase topamax to 50mg  twice daily After 2 weeks, go to 100mg  twice daily if tolerating well

## 2018-10-31 NOTE — Assessment & Plan Note (Signed)
Again discussed importance of diet and exercise and making healthy choices She is tolerating low-dose topamax, but weight is stable We will increase to 50mg  BID and then if tolerating well to 100mg  BID after 2 weeks At f/u in 3 months, consider dose titration

## 2018-10-31 NOTE — Assessment & Plan Note (Signed)
As we discussed last time, patient has times of significant binging Goal of topamax is to suppress this and appetite to aid in healthy lifestyle and weight loss

## 2018-10-31 NOTE — Progress Notes (Signed)
Patient: Kim Buchanan Female    DOB: 1951-10-06   68 y.o.   MRN: [id number] Visit Date: 10/31/2018  Today's Provider: Lavon Paganini, MD   Chief Complaint  Patient presents with  . Obesity   Subjective:     HPI   Follow up for Obesity BMI >34  The patient was last seen for this 1 months ago. Changes made at last visit include starting patient on Topamax 50mg  qhs.  She reports excellent compliance with treatment. She feels that condition is Unchanged. She is not having side effects.  Patient states that she is actively exercising at least 2x a week and trying to maintain a balanced diet, patient would like to discuss today increasing dosage of Topamax.    ------------------------------------------------------------------------------------   No Known Allergies   Current Outpatient Medications:  .  aspirin 81 MG tablet, Take 81 mg by mouth daily. , Disp: , Rfl:  .  atorvastatin (LIPITOR) 20 MG tablet, Take 1 tablet (20 mg total) by mouth at bedtime., Disp: 90 tablet, Rfl: 1 .  budesonide-formoterol (SYMBICORT) 160-4.5 MCG/ACT inhaler, Inhale 2 puffs into the lungs 2 (two) times daily. Rinse mouth after use, Disp: 1 Inhaler, Rfl: 12 .  isosorbide mononitrate (IMDUR) 30 MG 24 hr tablet, Take 1 tablet (30 mg total) by mouth daily., Disp: 90 tablet, Rfl: 3 .  omeprazole (PRILOSEC) 40 MG capsule, Take 1 capsule (40 mg total) by mouth daily., Disp: 90 capsule, Rfl: 1 .  topiramate (TOPAMAX) 50 MG tablet, Take 1 tablet (50 mg total) by mouth at bedtime., Disp: 30 tablet, Rfl: 2  Review of Systems  Constitutional: Negative.   HENT: Negative.   Respiratory: Negative.   Cardiovascular: Negative.   Gastrointestinal: Negative.   Endocrine: Negative.   Genitourinary: Negative.   Musculoskeletal: Negative.     Social History   Tobacco Use  . Smoking status: Current Every Day Smoker    Packs/day: 0.50    Years: 47.00    Pack years: 23.50    Types: Cigarettes  .  Smokeless tobacco: Never Used  . Tobacco comment: started at age 44  Substance Use Topics  . Alcohol use: No    Alcohol/week: 0.0 standard drinks      Objective:   BP 115/76   Pulse 91   Temp 97.8 F (36.6 C) (Oral)   Resp 16   Wt 190 lb 3.2 oz (86.3 kg)   BMI 34.79 kg/m  Vitals:   10/31/18 0847  BP: 115/76  Pulse: 91  Resp: 16  Temp: 97.8 F (36.6 C)  TempSrc: Oral  Weight: 190 lb 3.2 oz (86.3 kg)     Physical Exam Vitals signs reviewed.  Constitutional:      General: She is not in acute distress.    Appearance: She is obese.  HENT:     Head: Normocephalic and atraumatic.     Right Ear: External ear normal.     Left Ear: External ear normal.     Nose: Nose normal.     Mouth/Throat:     Pharynx: Oropharynx is clear.  Eyes:     Conjunctiva/sclera: Conjunctivae normal.  Neck:     Musculoskeletal: Neck supple.  Cardiovascular:     Rate and Rhythm: Normal rate and regular rhythm.     Pulses: Normal pulses.     Heart sounds: Normal heart sounds.  Pulmonary:     Effort: Pulmonary effort is normal. No respiratory distress.     Breath  sounds: Normal breath sounds. No wheezing or rhonchi.  Abdominal:     General: There is no distension.     Palpations: Abdomen is soft.     Tenderness: There is no abdominal tenderness.  Musculoskeletal:     Right lower leg: No edema.     Left lower leg: No edema.  Lymphadenopathy:     Cervical: No cervical adenopathy.  Skin:    General: Skin is warm and dry.     Capillary Refill: Capillary refill takes less than 2 seconds.     Findings: No rash.  Neurological:     Mental Status: She is alert and oriented to person, place, and time. Mental status is at baseline.  Psychiatric:        Mood and Affect: Mood normal.        Behavior: Behavior normal.         Assessment & Plan   Problem List Items Addressed This Visit      Other   Class 1 obesity with serious comorbidity and body mass index (BMI) of 34.0 to 34.9 in adult  - Primary    Again discussed importance of diet and exercise and making healthy choices She is tolerating low-dose topamax, but weight is stable We will increase to 50mg  BID and then if tolerating well to 100mg  BID after 2 weeks At f/u in 3 months, consider dose titration       Binge eating    As we discussed last time, patient has times of significant binging Goal of topamax is to suppress this and appetite to aid in healthy lifestyle and weight loss          Return in about 3 months (around 01/30/2019) for Obesity f/u.   The entirety of the information documented in the History of Present Illness, Review of Systems and Physical Exam were personally obtained by me. Portions of this information were initially documented by Hebert Soho, CMA and reviewed by me for thoroughness and accuracy.    Virginia Crews, MD, MPH [facility] 10/31/2018 9:09 AM

## 2018-11-10 DIAGNOSIS — I340 Nonrheumatic mitral (valve) insufficiency: Secondary | ICD-10-CM | POA: Diagnosis not present

## 2018-11-10 DIAGNOSIS — E785 Hyperlipidemia, unspecified: Secondary | ICD-10-CM | POA: Diagnosis not present

## 2018-11-10 DIAGNOSIS — R0602 Shortness of breath: Secondary | ICD-10-CM | POA: Diagnosis not present

## 2018-11-10 DIAGNOSIS — I2510 Atherosclerotic heart disease of native coronary artery without angina pectoris: Secondary | ICD-10-CM | POA: Diagnosis not present

## 2018-11-13 ENCOUNTER — Other Ambulatory Visit: Payer: Self-pay

## 2018-11-13 DIAGNOSIS — I25118 Atherosclerotic heart disease of native coronary artery with other forms of angina pectoris: Secondary | ICD-10-CM

## 2018-11-13 DIAGNOSIS — R079 Chest pain, unspecified: Secondary | ICD-10-CM | POA: Diagnosis not present

## 2018-11-13 MED ORDER — ISOSORBIDE MONONITRATE ER 30 MG PO TB24: 30.0000 mg | ORAL_TABLET | Freq: Every day | ORAL | 3 refills | Status: DC

## 2018-11-13 NOTE — Telephone Encounter (Signed)
Pt states that [facility] did not receive the refill request for Isosorbide.  Please resend.   Thanks,   --Mickel Baas

## 2018-11-14 ENCOUNTER — Telehealth: Payer: Self-pay

## 2018-11-14 DIAGNOSIS — R0602 Shortness of breath: Secondary | ICD-10-CM | POA: Diagnosis not present

## 2018-11-14 NOTE — Telephone Encounter (Signed)
Patient advised.

## 2018-11-14 NOTE — Telephone Encounter (Signed)
I have not seen a PA and doubt she needs one for Imdur.  I did send a refill yesterday.

## 2018-11-14 NOTE — Telephone Encounter (Signed)
Patient calling wanting to know status of pre-authorization on Isosorbide- Monoitrate. KW

## 2018-11-20 DIAGNOSIS — R0602 Shortness of breath: Secondary | ICD-10-CM | POA: Diagnosis not present

## 2018-11-20 DIAGNOSIS — I2510 Atherosclerotic heart disease of native coronary artery without angina pectoris: Secondary | ICD-10-CM | POA: Diagnosis not present

## 2018-11-20 DIAGNOSIS — G4730 Sleep apnea, unspecified: Secondary | ICD-10-CM | POA: Diagnosis not present

## 2018-11-20 DIAGNOSIS — E785 Hyperlipidemia, unspecified: Secondary | ICD-10-CM | POA: Diagnosis not present

## 2018-11-20 DIAGNOSIS — K219 Gastro-esophageal reflux disease without esophagitis: Secondary | ICD-10-CM | POA: Diagnosis not present

## 2018-11-24 DIAGNOSIS — R0602 Shortness of breath: Secondary | ICD-10-CM | POA: Diagnosis not present

## 2018-11-24 DIAGNOSIS — I209 Angina pectoris, unspecified: Secondary | ICD-10-CM | POA: Diagnosis not present

## 2018-11-24 DIAGNOSIS — R079 Chest pain, unspecified: Secondary | ICD-10-CM | POA: Diagnosis not present

## 2018-11-28 DIAGNOSIS — K210 Gastro-esophageal reflux disease with esophagitis: Secondary | ICD-10-CM | POA: Diagnosis not present

## 2018-11-28 DIAGNOSIS — I10 Essential (primary) hypertension: Secondary | ICD-10-CM | POA: Diagnosis not present

## 2018-12-03 ENCOUNTER — Other Ambulatory Visit: Payer: Self-pay

## 2018-12-11 IMAGING — DX DG CHEST 1V PORT
1 series · 2 of 2 positions shown · non-contrast
Comparison: 11/13/2017

CLINICAL DATA: Acute respiratory failure

EXAM:
PORTABLE CHEST 1 VIEW

[Series 1: chest ap · 0.14mm/px · 2 of 2 slices shown]
[im 1/2]
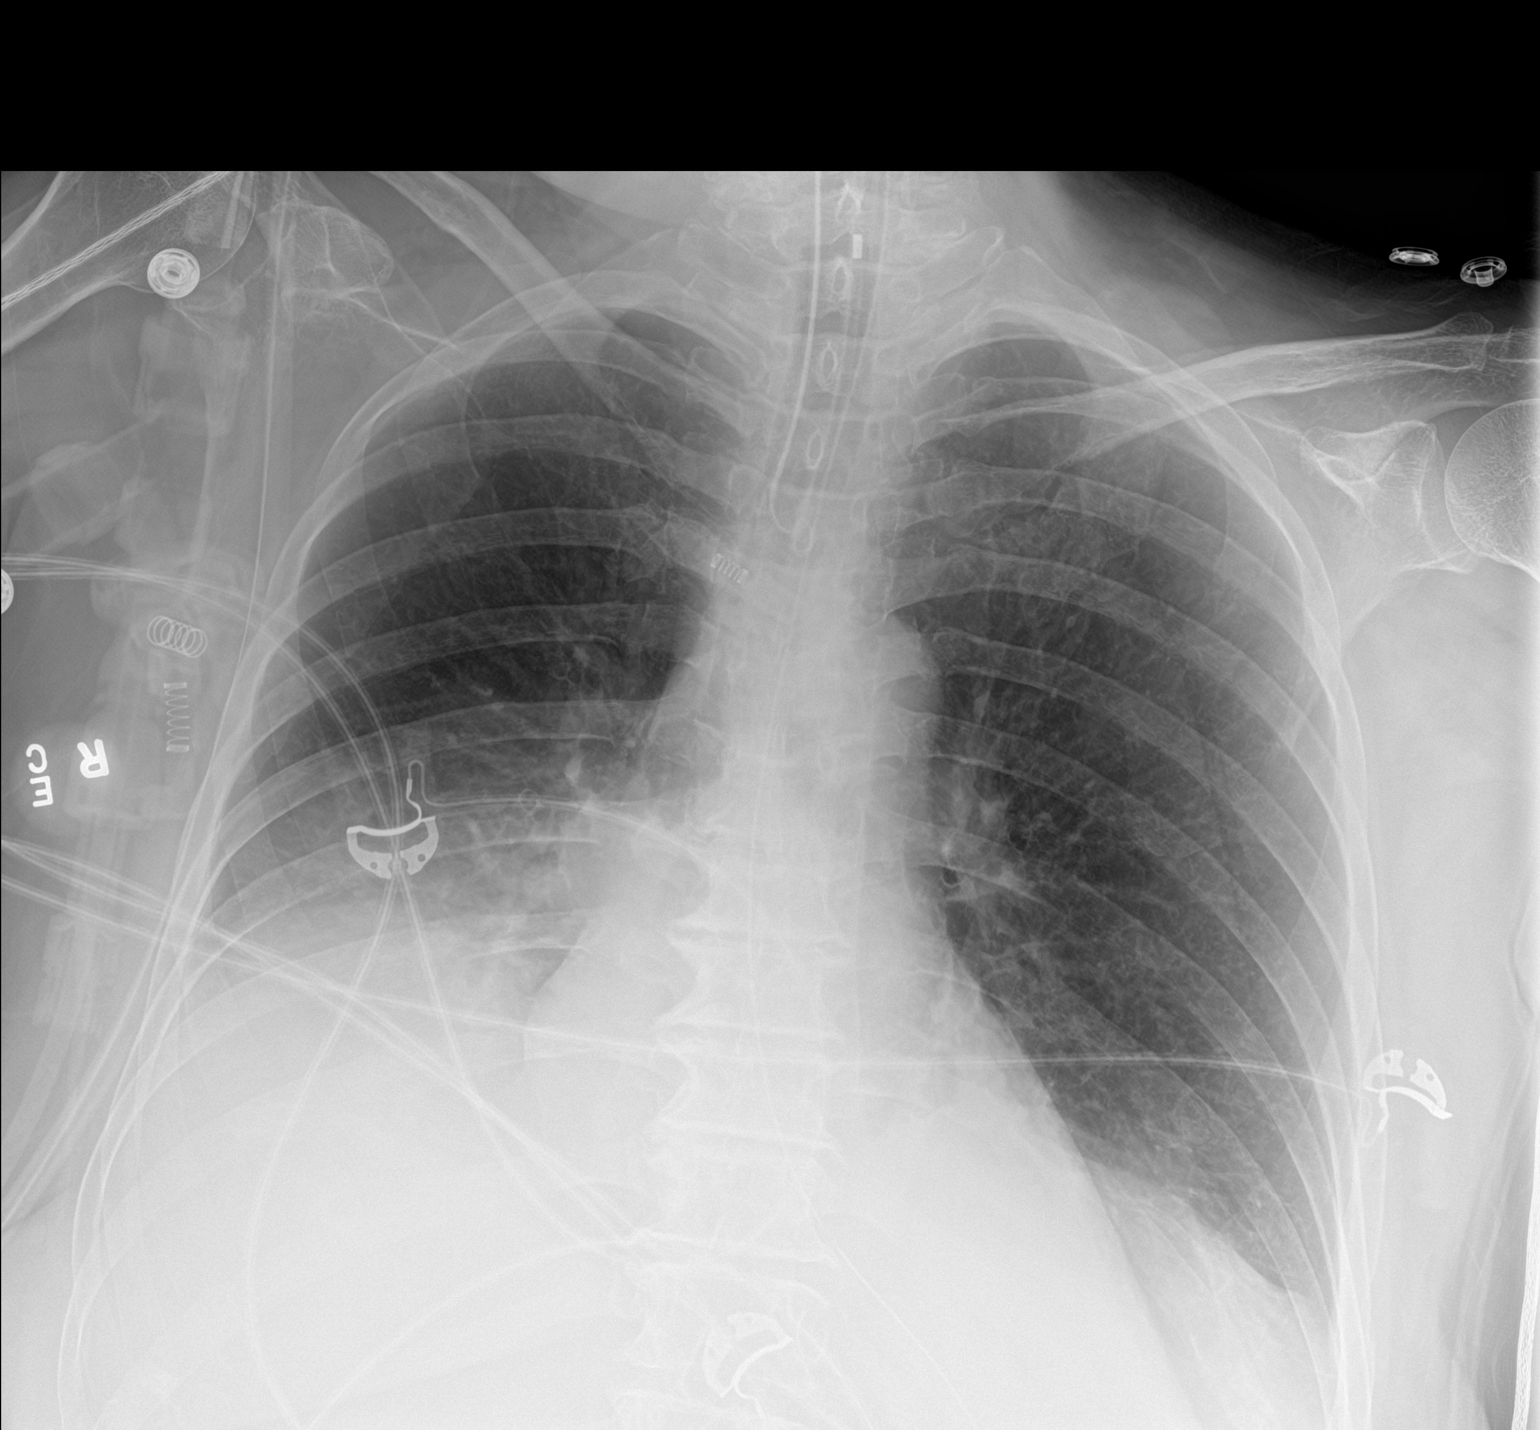
[im 2/2]
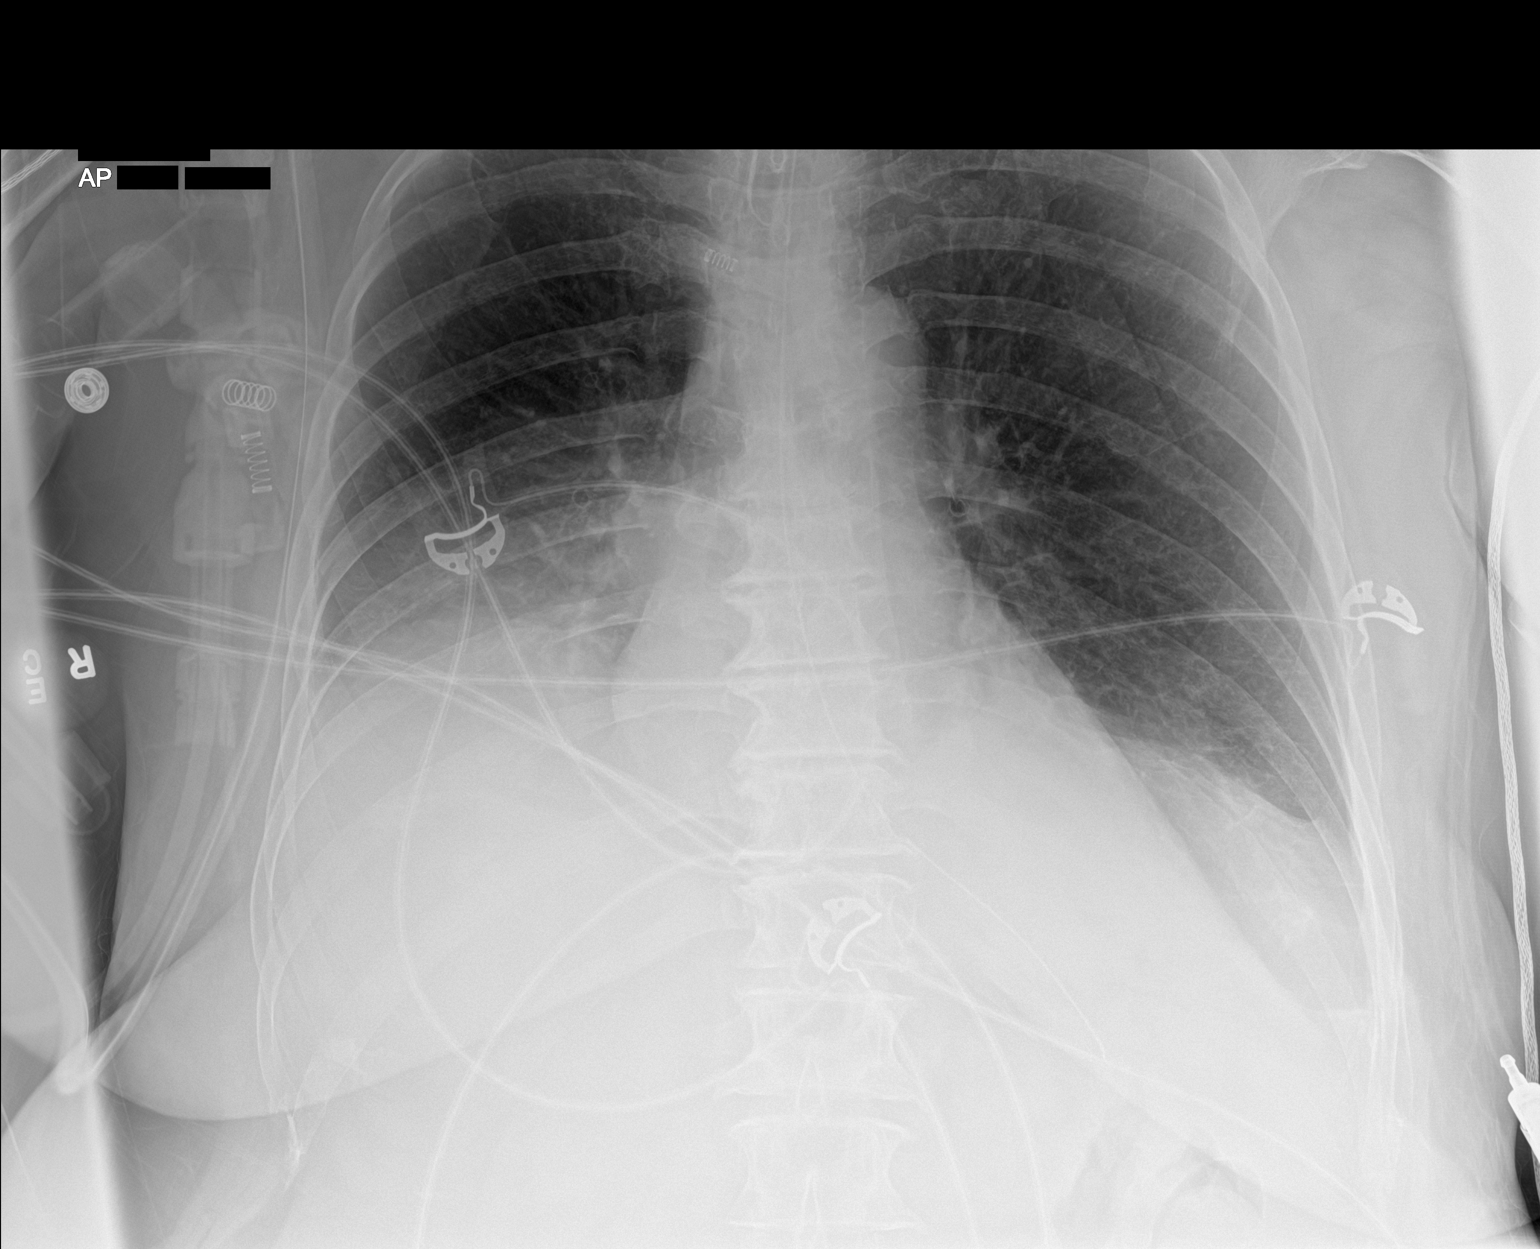

[2 of 2 positions shown; findings below may reference images not displayed]

FINDINGS: Cardiac shadow is stable. Endotracheal tube and nasogastric catheter
are again seen and stable. Elevation the right hemidiaphragm is
again noted with right basilar infiltrate and likely small effusion.
No bony abnormality is noted.
IMPRESSION: New right basilar infiltrate and effusion.

## 2019-01-28 IMAGING — DX DG ABDOMEN 1V
1 series · 1 of 1 positions shown · non-contrast
Comparison: Abdominal radiograph performed 11/15/2017

CLINICAL DATA: Orogastric tube placement.

EXAM:
ABDOMEN - 1 VIEW

[abdomen kub]
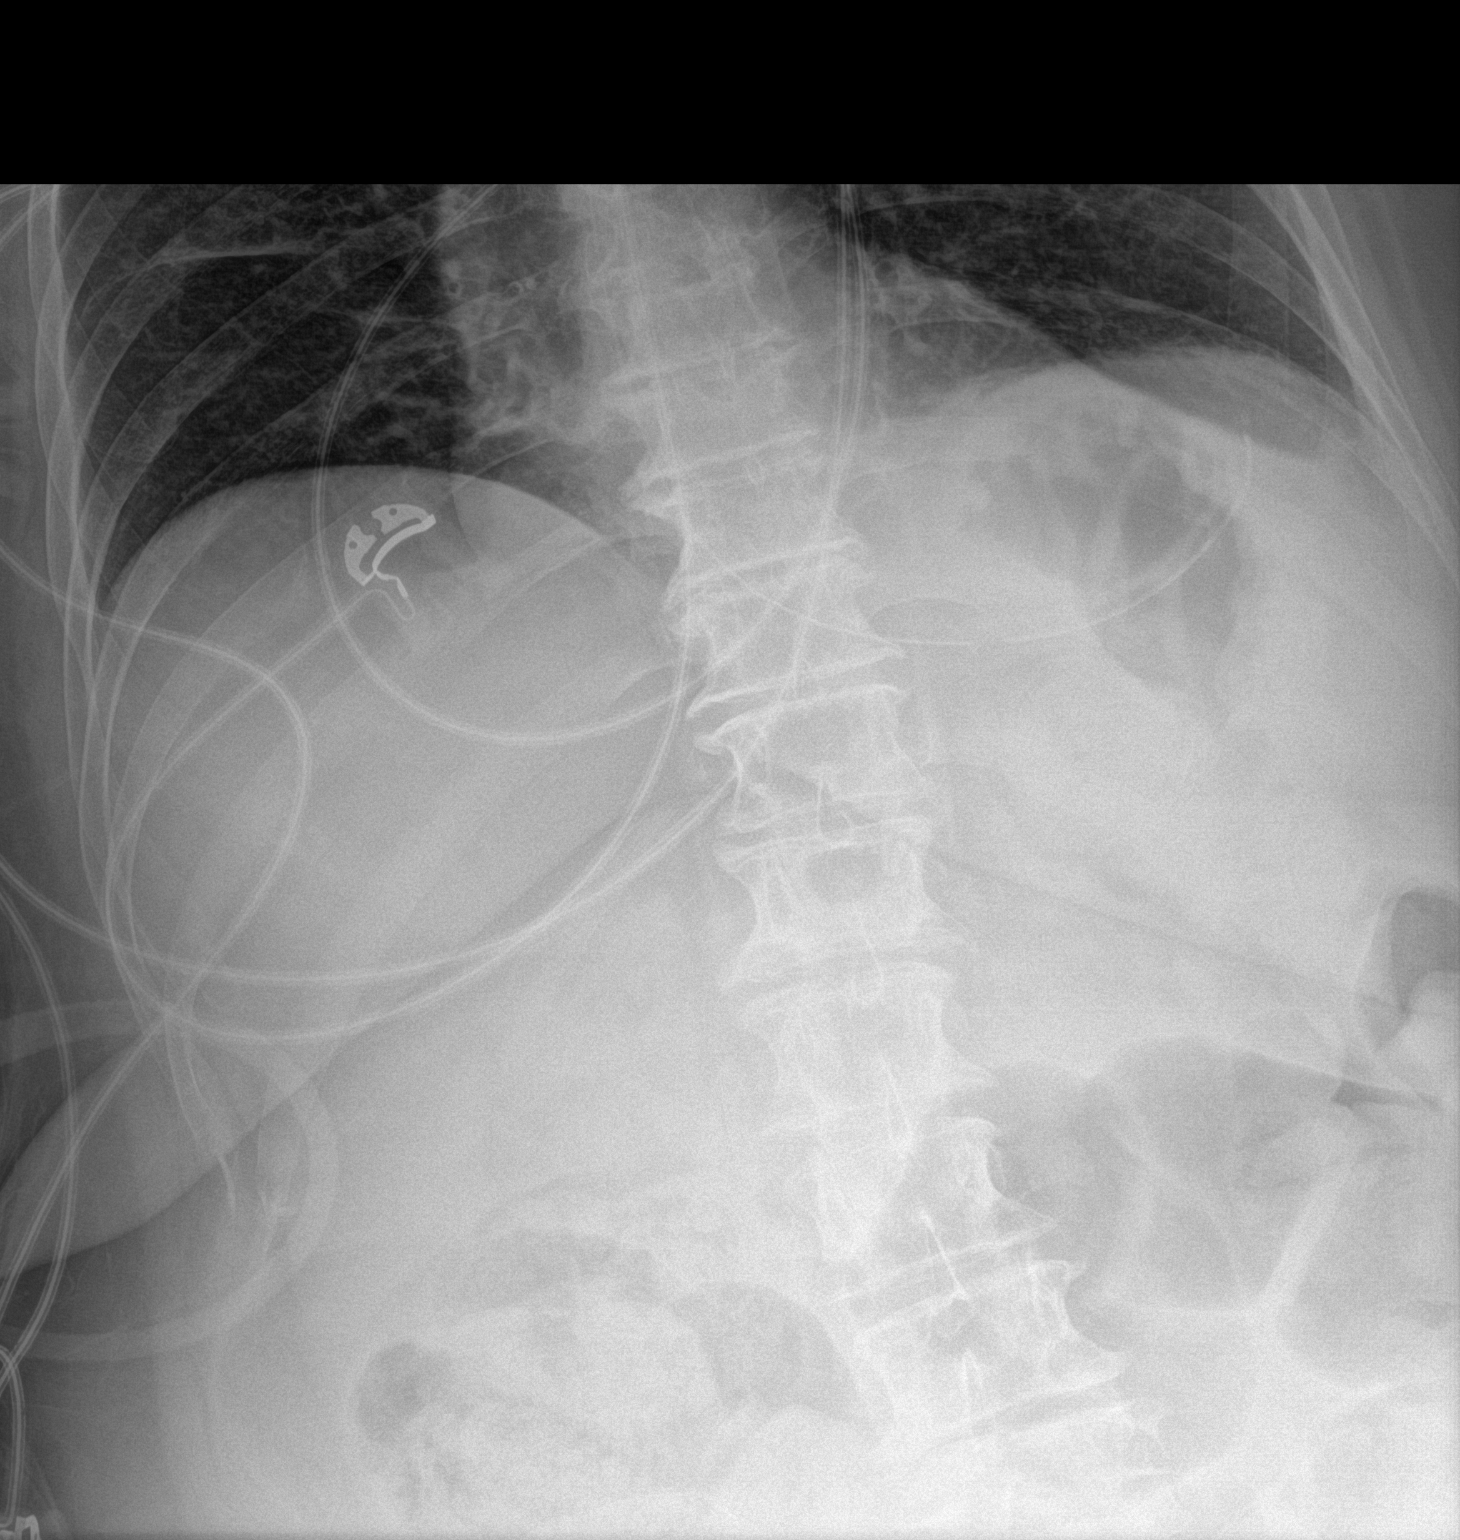

[1 of 1 positions shown; findings below may reference images not displayed]

FINDINGS: The patient's enteric tube is noted ending overlying the fundus of
the stomach.

The visualized bowel gas pattern is grossly unremarkable, with a
moderate to large amount of stool noted in the colon. No acute
osseous abnormalities are seen.
IMPRESSION: Enteric tube noted ending overlying the fundus of the stomach.

## 2019-01-29 IMAGING — DX DG CHEST 1V
1 series · 1 of 1 positions shown · non-contrast
Comparison: Chest radiograph 01/05/2018.

CLINICAL DATA: Status post PICC line placement.

EXAM:
CHEST  1 VIEW

[chest ap]
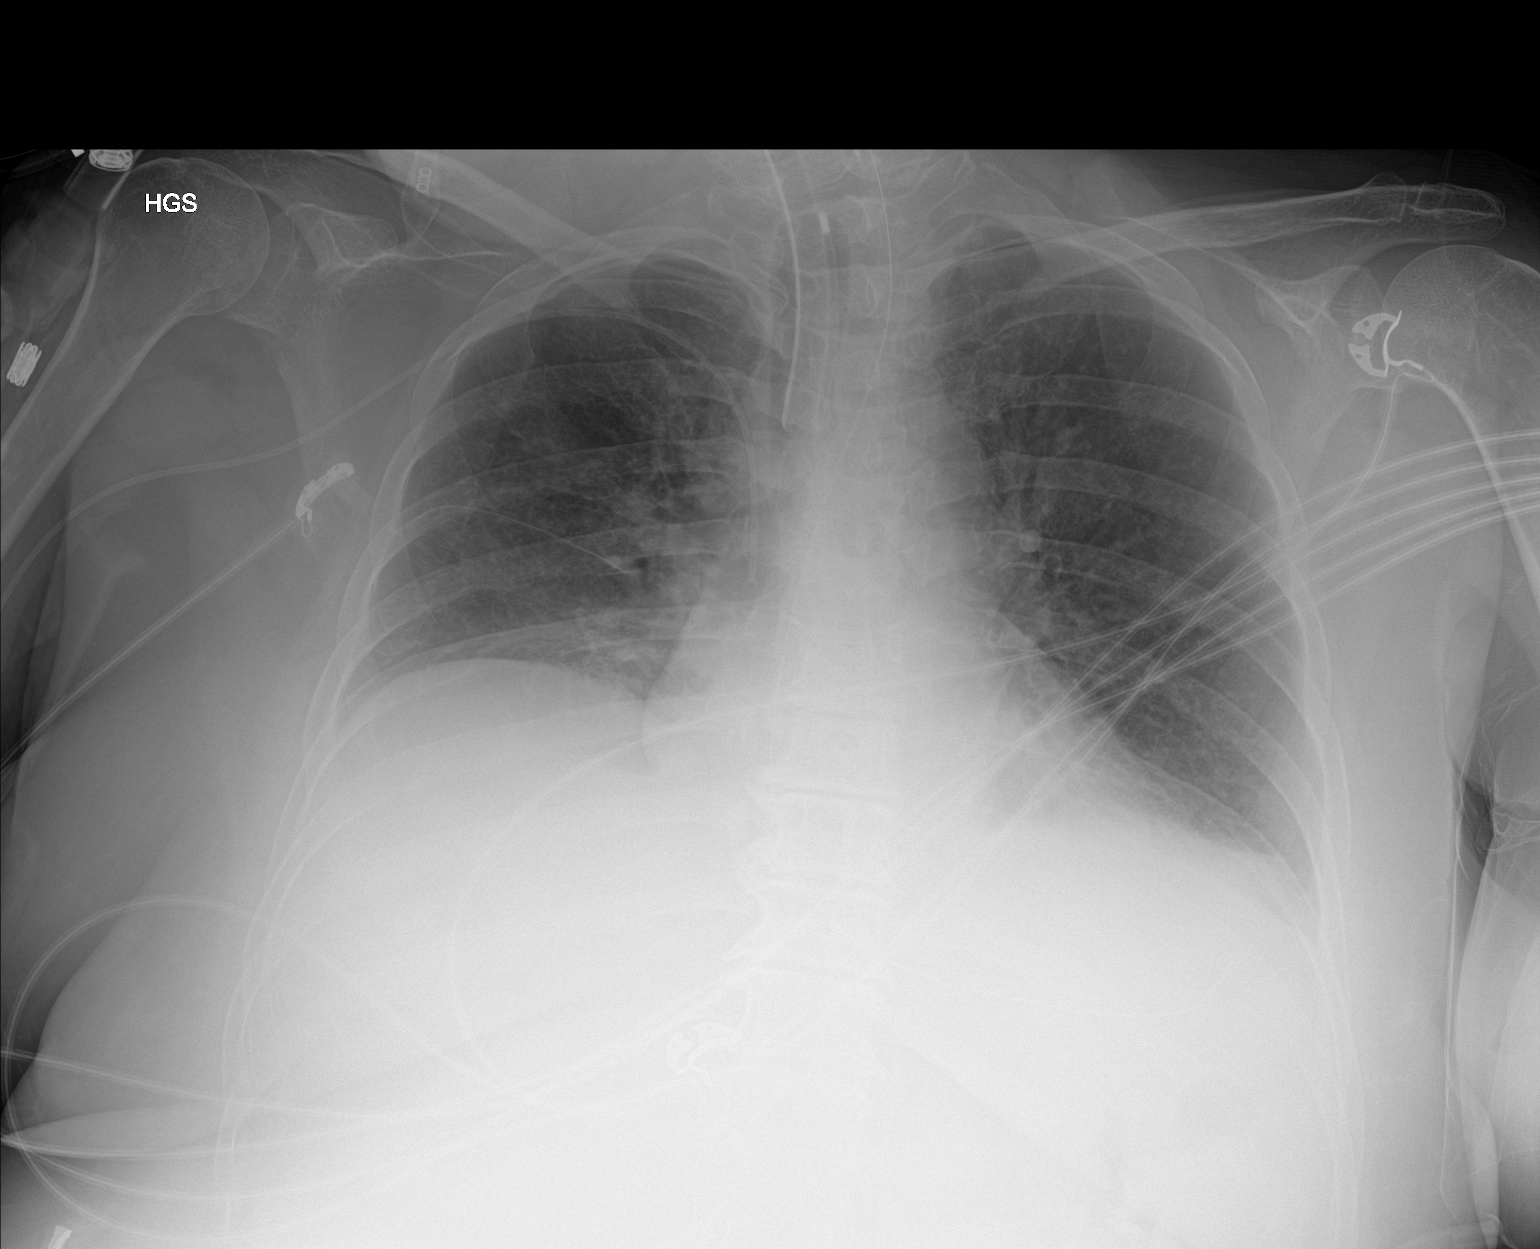

[1 of 1 positions shown; findings below may reference images not displayed]

FINDINGS: ET tube terminates in the mid trachea. Enteric tube courses inferior
to the diaphragm. Right upper extremity PICC line tip projects over
the superior vena cava. Monitoring leads overlie the patient.
Patient is rotated to the right. Low lung volumes. Stable cardiac
and mediastinal contours. Bibasilar airspace opacities. No pleural
effusion or pneumothorax.
IMPRESSION: Low lung volumes with basilar atelectasis.

Right upper extremity PICC line tip projects over the superior vena
cava.

## 2019-01-29 IMAGING — DX DG CHEST 1V PORT
1 series · 1 of 1 positions shown · non-contrast
Comparison: 01/04/2018

CLINICAL DATA: Smoker, code sepsis initiated, CT head negative,
chest x-ray negative, patient evaluated in the emergency room,
resting comfortably intubated on propofol drip, patient is now being
admitted for acute hypoxic respiratory failure,...*comment was
truncated*

EXAM:
PORTABLE CHEST 1 VIEW

[chest ap]
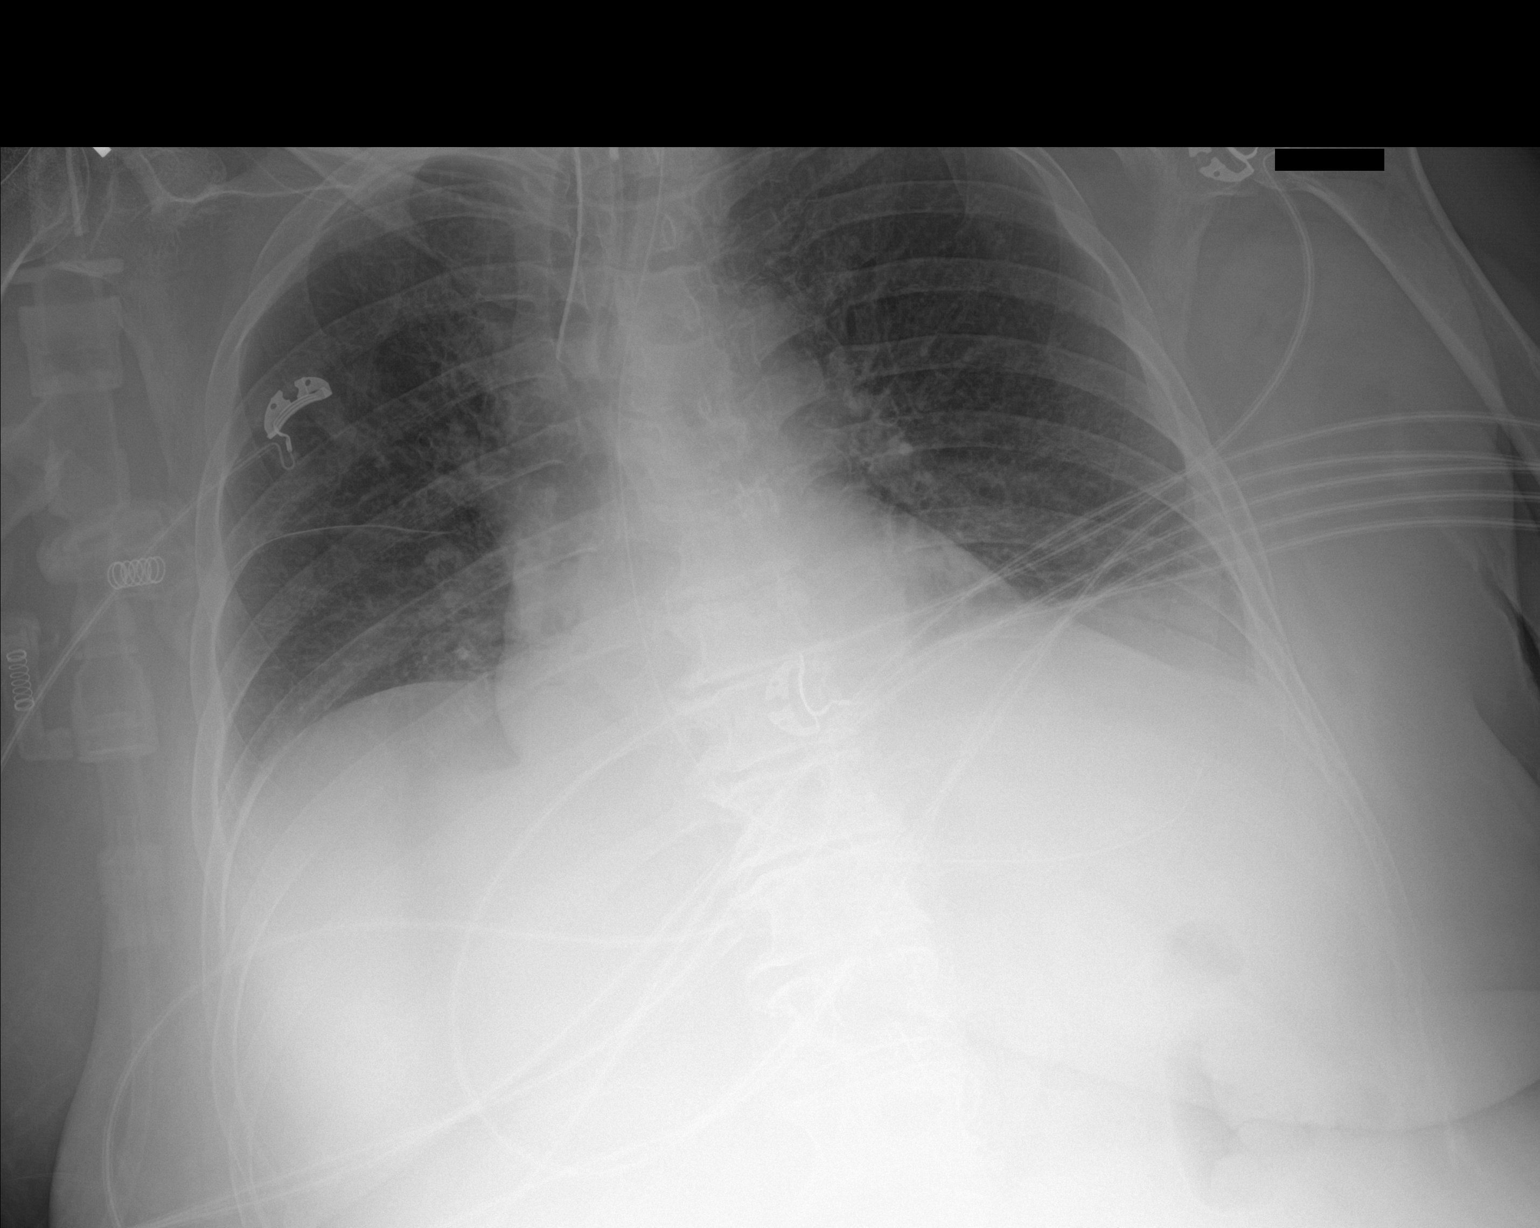

[1 of 1 positions shown; findings below may reference images not displayed]

FINDINGS: Endotracheal tube and NG tube good position. LEFT basilar
atelectasis is increased. Upper lobes are clear. Low lung volumes.
IMPRESSION: 1. Increased LEFT lower lobe atelectasis.
2. Endotracheal tube and NG tube appear in good position.

## 2019-01-31 IMAGING — DX DG CHEST 1V PORT
1 series · 1 of 1 positions shown · non-contrast
Comparison: 01/05/2018.

CLINICAL DATA: Respiratory failure.

EXAM:
PORTABLE CHEST 1 VIEW

[chest ap]
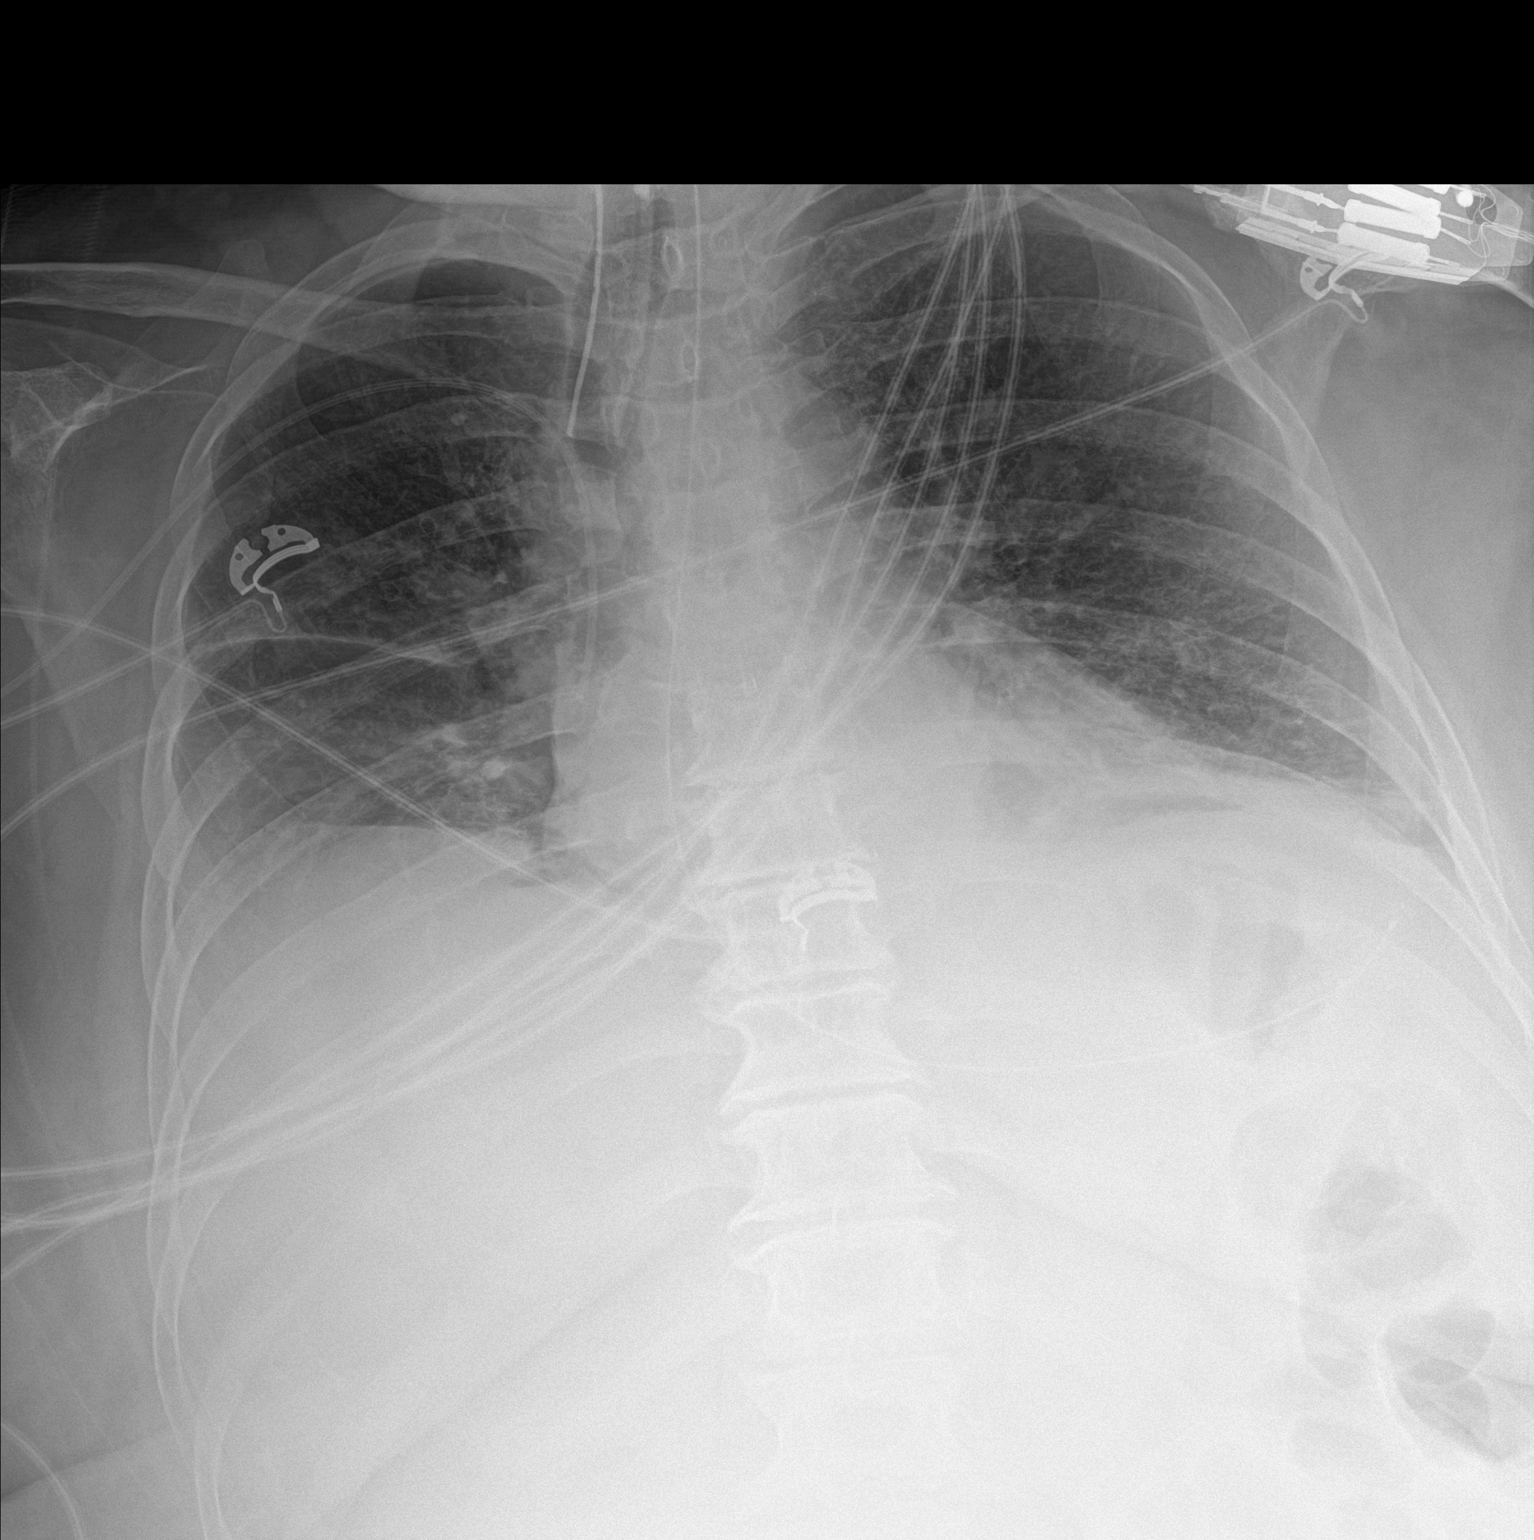

[1 of 1 positions shown; findings below may reference images not displayed]

FINDINGS: Endotracheal tube, NG tube, right PICC line in stable position.
Heart size stable. Low lung volumes with mild bibasilar atelectasis.
No pleural effusion or pneumothorax. Thoracic spine scoliosis and
degenerative change.
IMPRESSION: 1.  Lines and tubes in stable position.

2.  Low lung volumes with mild bibasilar atelectasis.

## 2019-02-02 ENCOUNTER — Ambulatory Visit: Payer: Self-pay | Admitting: Family Medicine

## 2019-03-04 ENCOUNTER — Encounter: Payer: Self-pay | Admitting: Family Medicine

## 2019-03-04 ENCOUNTER — Ambulatory Visit (INDEPENDENT_AMBULATORY_CARE_PROVIDER_SITE_OTHER): Payer: Medicare Other | Admitting: Family Medicine

## 2019-03-04 ENCOUNTER — Telehealth: Payer: Self-pay

## 2019-03-04 VITALS — Wt 200.0 lb

## 2019-03-04 DIAGNOSIS — J410 Simple chronic bronchitis: Secondary | ICD-10-CM

## 2019-03-04 DIAGNOSIS — E669 Obesity, unspecified: Secondary | ICD-10-CM

## 2019-03-04 DIAGNOSIS — F3342 Major depressive disorder, recurrent, in full remission: Secondary | ICD-10-CM

## 2019-03-04 DIAGNOSIS — R632 Polyphagia: Secondary | ICD-10-CM | POA: Diagnosis not present

## 2019-03-04 DIAGNOSIS — K219 Gastro-esophageal reflux disease without esophagitis: Secondary | ICD-10-CM | POA: Diagnosis not present

## 2019-03-04 DIAGNOSIS — Z87891 Personal history of nicotine dependence: Secondary | ICD-10-CM

## 2019-03-04 DIAGNOSIS — Z6834 Body mass index (BMI) 34.0-34.9, adult: Secondary | ICD-10-CM

## 2019-03-04 DIAGNOSIS — F5104 Psychophysiologic insomnia: Secondary | ICD-10-CM

## 2019-03-04 NOTE — Telephone Encounter (Signed)
Patient called saying that Dr. Jacinto Reap was supposed to send in 2 prescriptions this morning (Trazodone and another prescription for weight loss) after her televisit. Patient has called the pharmacy and they dont have any prescriptions for her. Pharmacy: [facility]

## 2019-03-04 NOTE — Progress Notes (Signed)
Patient: Kim Buchanan Female    DOB: 02/01/1951   68 y.o.   MRN: [id number] Visit Date: 03/05/2019  Today's Provider: Lavon Paganini, MD   Chief Complaint  Patient presents with  . Follow-up   Subjective:    Virtual Visit via Telephone Note  I connected with Kim Buchanan on 03/05/19 at  8:00 AM EDT by telephone and verified that I am speaking with the correct person using two identifiers.   Patient location: home Provider location: [place] involved in the visit: patient, provider    I discussed the limitations, risks, security and privacy concerns of performing an evaluation and management service by telephone and the availability of in person appointments. I also discussed with the patient that there may be a patient responsible charge related to this service. The patient expressed understanding and agreed to proceed.   HPI  Patient states that she has not had any weight loss with Topamax.  She has been taking 100mg  BID.  Denies side effects.  States she did not experience any decrease in cravings.  Quit smoking by cutting back 3 m ago   Depression screen [facility] 2/9 03/04/2019 10/03/2018 03/13/2018 01/01/2018 11/06/2017  Decreased Interest 0 1 2 3 1   Down, Depressed, Hopeless 0 0 1 0 1  PHQ - 2 Score 0 1 3 3 2   Altered sleeping 2 - 1 3 2   Tired, decreased energy 2 - 1 3 3   Change in appetite 0 - 2 2 3   Feeling bad or failure about yourself  1 - 0 0 0  Trouble concentrating 2 - 1 0 0  Moving slowly or fidgety/restless 0 - 1 3 3   Suicidal thoughts 0 - 0 0 0  PHQ-9 Score 7 - 9 14 13   Difficult doing work/chores Not difficult at all - Very difficult Somewhat difficult Somewhat difficult  Some recent data might be hidden     Taking trazodone to help her sleep at night - would like Rx.  Denies any depression symptoms. Has been off antidepressants for a wile   No Known Allergies   Current Outpatient Medications:  .  aspirin 81 MG  tablet, Take 81 mg by mouth daily. , Disp: , Rfl:  .  atorvastatin (LIPITOR) 20 MG tablet, Take 1 tablet (20 mg total) by mouth at bedtime., Disp: 90 tablet, Rfl: 1 .  buPROPion (WELLBUTRIN SR) 150 MG 12 hr tablet, Take 1 tablet (150 mg total) by mouth 2 (two) times daily., Disp: 180 tablet, Rfl: 0 .  isosorbide mononitrate (IMDUR) 30 MG 24 hr tablet, Take 1 tablet (30 mg total) by mouth daily., Disp: 90 tablet, Rfl: 3 .  omeprazole (PRILOSEC) 40 MG capsule, Take 1 capsule (40 mg total) by mouth daily., Disp: 90 capsule, Rfl: 1 .  traZODone (DESYREL) 50 MG tablet, Take 0.5-1 tablets (25-50 mg total) by mouth at bedtime as needed for sleep., Disp: 90 tablet, Rfl: 0  Review of Systems  Social History   Tobacco Use  . Smoking status: Former Smoker    Packs/day: 0.50    Years: 47.00    Pack years: 23.50    Types: Cigarettes    Last attempt to quit: 11/29/2018    Years since quitting: 0.2  . Smokeless tobacco: Never Used  . Tobacco comment: started at age 79  Substance Use Topics  . Alcohol use: No    Alcohol/week: 0.0 standard drinks      Objective:  Wt 200 lb (90.7 kg)   BMI 36.58 kg/m  Vitals:   03/04/19 0740  Weight: 200 lb (90.7 kg)     Physical Exam      Assessment & Plan    I discussed the assessment and treatment plan with the patient. The patient was provided an opportunity to ask questions and all were answered. The patient agreed with the plan and demonstrated an understanding of the instructions.   The patient was advised to call back or seek an in-person evaluation if the symptoms worsen or if the condition fails to improve as anticipated.  Problem List Items Addressed This Visit      Respiratory   COPD (chronic obstructive pulmonary disease) ([place]) - Primary    Chronic and stable Has stopped symbicort and does not want to restart She does not believe she needs any controller meds Discussed signs of exacerbation and precautions        Digestive    Gastroesophageal reflux disease    Well controlled Asymptomatic Continue PPI        Other   Psychophysiological insomnia    Will start trazodone qhs Discussed sleep hygeine      Personal history of tobacco use, presenting hazards to health    Congratulated patient on quitting smoking Discussed importance of not resuming      Class 1 obesity with serious comorbidity and body mass index (BMI) of 34.0 to 34.9 in adult    No improvement and actually gained weight on topamax Will d/c Discussed diet and exercise Start wellbutrin - discussed possible side effects      Binge eating    Not improving with topamax Start wellbutrin instead as above for obesity      Recurrent major depressive disorder, in full remission ([place])    In remission Denies any depressive symptoms Discussed that wellbutrin for weight loss will also help with any depression Repeat PHQ9 at next visit      Relevant Medications   buPROPion (WELLBUTRIN SR) 150 MG 12 hr tablet   traZODone (DESYREL) 50 MG tablet       Return in about 3 months (around 06/04/2019) for chronic disease f/u.   The entirety of the information documented in the History of Present Illness, Review of Systems and Physical Exam were personally obtained by me. Portions of this information were initially documented by Tiburcio Pea, CMA and reviewed by me for thoroughness and accuracy.    Virginia Crews, MD, MPH [facility] 03/05/2019 3:08 PM

## 2019-03-05 DIAGNOSIS — F3342 Major depressive disorder, recurrent, in full remission: Secondary | ICD-10-CM | POA: Insufficient documentation

## 2019-03-05 MED ORDER — TRAZODONE HCL 50 MG PO TABS: 25.0000 mg | ORAL_TABLET | Freq: Every evening | ORAL | 0 refills | Status: DC | PRN

## 2019-03-05 MED ORDER — BUPROPION HCL ER (SR) 150 MG PO TB12: 150.0000 mg | ORAL_TABLET | Freq: Two times a day (BID) | ORAL | 0 refills | Status: DC

## 2019-03-05 NOTE — Assessment & Plan Note (Signed)
No improvement and actually gained weight on topamax Will d/c Discussed diet and exercise Start wellbutrin - discussed possible side effects

## 2019-03-05 NOTE — Assessment & Plan Note (Signed)
Not improving with topamax Start wellbutrin instead as above for obesity

## 2019-03-05 NOTE — Telephone Encounter (Signed)
Pt's husband called this morning asking if the prescriptions can be sent to [facility] instead of [facility].  Con [place]

## 2019-03-05 NOTE — Telephone Encounter (Signed)
They were sent to [facility], but I went ahead and resent them this AM.  Please let us know if there is any problem.

## 2019-03-05 NOTE — Assessment & Plan Note (Signed)
Will start trazodone qhs Discussed sleep hygeine

## 2019-03-05 NOTE — Assessment & Plan Note (Signed)
Congratulated patient on quitting smoking Discussed importance of not resuming

## 2019-03-05 NOTE — Assessment & Plan Note (Signed)
In remission Denies any depressive symptoms Discussed that wellbutrin for weight loss will also help with any depression Repeat PHQ9 at next visit

## 2019-03-05 NOTE — Assessment & Plan Note (Signed)
Well controlled Asymptomatic Continue PPI

## 2019-03-05 NOTE — Assessment & Plan Note (Signed)
Chronic and stable Has stopped symbicort and does not want to restart She does not believe she needs any controller meds Discussed signs of exacerbation and precautions

## 2019-03-19 ENCOUNTER — Other Ambulatory Visit: Payer: Self-pay | Admitting: Family Medicine

## 2019-03-19 DIAGNOSIS — K219 Gastro-esophageal reflux disease without esophagitis: Secondary | ICD-10-CM

## 2019-03-19 DIAGNOSIS — E782 Mixed hyperlipidemia: Secondary | ICD-10-CM

## 2019-03-30 DIAGNOSIS — I10 Essential (primary) hypertension: Secondary | ICD-10-CM | POA: Diagnosis not present

## 2019-03-30 DIAGNOSIS — R0602 Shortness of breath: Secondary | ICD-10-CM | POA: Diagnosis not present

## 2019-03-30 DIAGNOSIS — G4730 Sleep apnea, unspecified: Secondary | ICD-10-CM | POA: Diagnosis not present

## 2019-04-02 ENCOUNTER — Encounter: Payer: Self-pay | Admitting: Cardiovascular Disease

## 2019-04-02 DIAGNOSIS — I2510 Atherosclerotic heart disease of native coronary artery without angina pectoris: Secondary | ICD-10-CM | POA: Diagnosis not present

## 2019-04-02 DIAGNOSIS — R079 Chest pain, unspecified: Secondary | ICD-10-CM | POA: Diagnosis not present

## 2019-04-02 DIAGNOSIS — R0602 Shortness of breath: Secondary | ICD-10-CM | POA: Diagnosis not present

## 2019-04-06 DIAGNOSIS — G4730 Sleep apnea, unspecified: Secondary | ICD-10-CM | POA: Diagnosis not present

## 2019-04-06 DIAGNOSIS — E785 Hyperlipidemia, unspecified: Secondary | ICD-10-CM | POA: Diagnosis not present

## 2019-04-06 DIAGNOSIS — R0602 Shortness of breath: Secondary | ICD-10-CM | POA: Diagnosis not present

## 2019-04-06 DIAGNOSIS — I10 Essential (primary) hypertension: Secondary | ICD-10-CM | POA: Diagnosis not present

## 2019-04-09 ENCOUNTER — Other Ambulatory Visit: Payer: Self-pay

## 2019-04-09 NOTE — Telephone Encounter (Signed)
Looks like Dr. B refilled this on 03-05-19 at [facility] for 90 days. Should still have at least a month and a half supply left. Is there a problem with the prescription?

## 2019-04-09 NOTE — Telephone Encounter (Signed)
This is Dr. Sharmaine Base patient. Patient request a refill.  [facility] mail order pharmacy.

## 2019-04-16 ENCOUNTER — Telehealth: Payer: Self-pay | Admitting: Family Medicine

## 2019-04-16 ENCOUNTER — Other Ambulatory Visit: Payer: Self-pay | Admitting: Family Medicine

## 2019-04-16 NOTE — Telephone Encounter (Signed)
appt scheduled

## 2019-04-16 NOTE — Telephone Encounter (Signed)
Med list in chart indicates these were refilled for 3 months on 03-05-19 and [facility] confirmed receipt.

## 2019-04-16 NOTE — Telephone Encounter (Signed)
Pt needing to know if she needs to come in to be seen or a virtual visit for constant  Coughing  Tickling in throat.  Please advise.  Thanks, [employer]

## 2019-04-17 ENCOUNTER — Encounter: Payer: Self-pay | Admitting: Physician Assistant

## 2019-04-17 ENCOUNTER — Telehealth: Payer: Self-pay | Admitting: Family Medicine

## 2019-04-17 ENCOUNTER — Ambulatory Visit (INDEPENDENT_AMBULATORY_CARE_PROVIDER_SITE_OTHER): Payer: Medicare Other | Admitting: Physician Assistant

## 2019-04-17 VITALS — BP 127/82 | HR 98 | Ht 63.0 in | Wt 200.0 lb

## 2019-04-17 DIAGNOSIS — R05 Cough: Secondary | ICD-10-CM

## 2019-04-17 DIAGNOSIS — J45909 Unspecified asthma, uncomplicated: Secondary | ICD-10-CM

## 2019-04-17 DIAGNOSIS — R059 Cough, unspecified: Secondary | ICD-10-CM

## 2019-04-17 MED ORDER — FLUTICASONE-SALMETEROL 100-50 MCG/DOSE IN AEPB: 1.0000 | INHALATION_SPRAY | Freq: Two times a day (BID) | RESPIRATORY_TRACT | 3 refills | Status: DC

## 2019-04-17 MED ORDER — BREO ELLIPTA 100-25 MCG/INH IN AEPB: 1.0000 | INHALATION_SPRAY | Freq: Every day | RESPIRATORY_TRACT | 2 refills | Status: DC

## 2019-04-17 NOTE — Telephone Encounter (Signed)
°  Pt went to pick up fluticasone furoate-vilanterol (BREO ELLIPTA) 100-25 MCG/INH AEPB .  The cost was over $150.  Pt needing another medication that is lower in cost.  Please let pt know on this asap.  Thanks, [employer]

## 2019-04-17 NOTE — Telephone Encounter (Signed)
PATIENT PAID FOR BREO.

## 2019-04-17 NOTE — Patient Instructions (Signed)
Asthma, Adult    Asthma is a long-term (chronic) condition in which the airways get tight and narrow. The airways are the breathing passages that lead from the nose and mouth down into the lungs. A person with asthma will have times when symptoms get worse. These are called asthma attacks. They can cause coughing, whistling sounds when you breathe (wheezing), shortness of breath, and chest pain. They can make it hard to breathe. There is no cure for asthma, but medicines and lifestyle changes can help control it.  There are many things that can bring on an asthma attack or make asthma symptoms worse (triggers). Common triggers include:  · Mold.  · Dust.  · Cigarette smoke.  · Cockroaches.  · Things that can cause allergy symptoms (allergens). These include animal skin flakes (dander) and pollen from trees or grass.  · Things that pollute the air. These may include household cleaners, wood smoke, smog, or chemical odors.  · Cold air, weather changes, and wind.  · Crying or laughing hard.  · Stress.  · Certain medicines or drugs.  · Certain foods such as dried fruit, potato chips, and grape juice.  · Infections, such as a cold or the flu.  · Certain medical conditions or diseases.  · Exercise or tiring activities.  Asthma may be treated with medicines and by staying away from the things that cause asthma attacks. Types of medicines may include:  · Controller medicines. These help prevent asthma symptoms. They are usually taken every day.  · Fast-acting reliever or rescue medicines. These quickly relieve asthma symptoms. They are used as needed and provide short-term relief.  · Allergy medicines if your attacks are brought on by allergens.  · Medicines to help control the body's defense (immune) system.  Follow these instructions at home:  Avoiding triggers in your home  · Change your heating and air conditioning filter often.  · Limit your use of fireplaces and wood stoves.  · Get rid of pests (such as roaches and  mice) and their droppings.  · Throw away plants if you see mold on them.  · Clean your floors. Dust regularly. Use cleaning products that do not smell.  · Have someone vacuum when you are not home. Use a vacuum cleaner with a HEPA filter if possible.  · Replace carpet with wood, tile, or vinyl flooring. Carpet can trap animal skin flakes and dust.  · Use allergy-proof pillows, mattress covers, and box spring covers.  · Wash bed sheets and blankets every week in hot water. Dry them in a dryer.  · Keep your bedroom free of any triggers.   · Avoid pets and keep windows closed when things that cause allergy symptoms are in the air.  · Use blankets that are made of polyester or cotton.  · Clean bathrooms and kitchens with bleach. If possible, have someone repaint the walls in these rooms with mold-resistant paint. Keep out of the rooms that are being cleaned and painted.  · Wash your hands often with soap and water. If soap and water are not available, use hand sanitizer.  · Do not allow anyone to smoke in your home.  General instructions  · Take over-the-counter and prescription medicines only as told by your doctor.  ? Talk with your doctor if you have questions about how or when to take your medicines.  ? Make note if you need to use your medicines more often than usual.  · Do not use any products that   contain nicotine or tobacco, such as cigarettes and e-cigarettes. If you need help quitting, ask your doctor.  · Stay away from secondhand smoke.  · Avoid doing things outdoors when allergen counts are high and when air quality is low.  · Wear a ski mask when doing outdoor activities in the winter. The mask should cover your nose and mouth. Exercise indoors on cold days if you can.  · Warm up before you exercise. Take time to cool down after exercise.  · Use a peak flow meter as told by your doctor. A peak flow meter is a tool that measures how well the lungs are working.  · Keep track of the peak flow meter's readings.  Write them down.  · Follow your asthma action plan. This is a written plan for taking care of your asthma and treating your attacks.  · Make sure you get all the shots (vaccines) that your doctor recommends. Ask your doctor about a flu shot and a pneumonia shot.  · Keep all follow-up visits as told by your doctor. This is important.  Contact a doctor if:  · You have wheezing, shortness of breath, or a cough even while taking medicine to prevent attacks.  · The mucus you cough up (sputum) is thicker than usual.  · The mucus you cough up changes from clear or white to yellow, green, gray, or bloody.  · You have problems from the medicine you are taking, such as:  ? A rash.  ? Itching.  ? Swelling.  ? Trouble breathing.  · You need reliever medicines more than 2-3 times a week.  · Your peak flow reading is still at 50-79% of your personal best after following the action plan for 1 hour.  · You have a fever.  Get help right away if:  · You seem to be worse and are not responding to medicine during an asthma attack.  · You are short of breath even at rest.  · You get short of breath when doing very little activity.  · You have trouble eating, drinking, or talking.  · You have chest pain or tightness.  · You have a fast heartbeat.  · Your lips or fingernails start to turn blue.  · You are light-headed or dizzy, or you faint.  · Your peak flow is less than 50% of your personal best.  · You feel too tired to breathe normally.  Summary  · Asthma is a long-term (chronic) condition in which the airways get tight and narrow. An asthma attack can make it hard to breathe.  · Asthma cannot be cured, but medicines and lifestyle changes can help control it.  · Make sure you understand how to avoid triggers and how and when to use your medicines.  This information is not intended to replace advice given to you by your health care provider. Make sure you discuss any questions you have with your health care provider.  Document  Released: 03/26/2008 Document Revised: 11/12/2016 Document Reviewed: 11/12/2016  Elsevier Interactive Patient Education © 2019 Elsevier Inc[employer]

## 2019-04-17 NOTE — Telephone Encounter (Signed)
I sent in Mandan. That may be the price but  Hopefully advair is cheaper. 1 puff twice daily.

## 2019-04-17 NOTE — Progress Notes (Signed)
Patient: Kim Buchanan Female    DOB: [birth year]   68 y.o.   MRN: [id number] Visit Date: 04/17/2019  Today's Provider: Trinna Post, PA-C   Chief Complaint  Patient presents with  . Referral   Subjective:    Virtual Visit via Telephone Note  I connected with Kim Buchanan on 04/17/19 at  8:40 AM EDT by telephone and verified that I am speaking with the correct person using two identifiers.   I discussed the limitations, risks, security and privacy concerns of performing an evaluation and management service by telephone and the availability of in person appointments. I also discussed with the patient that there may be a patient responsible charge related to this service. The patient expressed understanding and agreed to proceed.  Patient location: home Provider location: [place] office  Persons involved in the visit: patient, provider   HPI   Patient is requesting referral to ENT. Patient reports she has a tickle in her throat that causes her to have a dry cough x's 4 months. She has been diagnosed with asthma and has seen pulmonologist Dr. Juanell Fairly in 05/2018. Was instructed to take Symbicort daily but is not doing so because she reports this did not help her. She reports her cardiologist Dr. Humphrey Rolls took a CT scan of her lungs and told her she did not have COPD. She has a history of smoking and quit in February of this year. She is denying wheezing, SOB. She is denying PND. She is denying hoarseness. Sh has a history of GERD and is taking omeprazole. She wants to see Dr. Pryor Ochoa at ENT to have him check her throat for the source of a cough.   No Known Allergies   Current Outpatient Medications:  .  aspirin 81 MG tablet, Take 81 mg by mouth daily. , Disp: , Rfl:  .  atorvastatin (LIPITOR) 20 MG tablet, TAKE 1 TABLET BY MOUTH AT  BEDTIME, Disp: 90 tablet, Rfl: 1 .  isosorbide mononitrate (IMDUR) 30 MG 24 hr tablet, Take 1 tablet (30 mg total) by mouth  daily., Disp: 90 tablet, Rfl: 3 .  lisinopril-hydrochlorothiazide (ZESTORETIC) 10-12.5 MG tablet, , Disp: , Rfl:  .  omeprazole (PRILOSEC) 40 MG capsule, TAKE 1 CAPSULE BY MOUTH  DAILY, Disp: 90 capsule, Rfl: 1 .  traZODone (DESYREL) 50 MG tablet, Take 0.5-1 tablets (25-50 mg total) by mouth at bedtime as needed for sleep., Disp: 90 tablet, Rfl: 0 .  fluticasone furoate-vilanterol (BREO ELLIPTA) 100-25 MCG/INH AEPB, Inhale 1 puff into the lungs daily., Disp: 60 each, Rfl: 2  Review of Systems  Constitutional: Negative.   Respiratory: Positive for cough.     Social History   Tobacco Use  . Smoking status: Former Smoker    Packs/day: 0.50    Years: 47.00    Pack years: 23.50    Types: Cigarettes    Quit date: 11/29/2018    Years since quitting: 0.3  . Smokeless tobacco: Never Used  . Tobacco comment: started at age 80  Substance Use Topics  . Alcohol use: No    Alcohol/week: 0.0 standard drinks      Objective:   BP 127/82 (BP Location: Right Arm, Patient Position: Sitting, Cuff Size: Normal)   Pulse 98   Ht 5\' 3"  (1.6 m)   Wt 200 lb (90.7 kg)   BMI 35.43 kg/m  Vitals:   04/17/19 0822  BP: 127/82  Pulse: 98  Weight: 200 lb (90.7  kg)  Height: 5\' 3"  (1.6 m)     Physical Exam Pulmonary:     Comments: She is talking in complete sentences without pausing. She is actively coughing during the conversation.      No results found for any visits on 04/17/19.     Assessment & Plan    1. Uncomplicated asthma, unspecified asthma severity, unspecified whether persistent  I have counseled patient that her chronic cough is likely coming from her untreated asthma. I have recommended she start Breo inhaler once daily prior to seeing ENT.   - fluticasone furoate-vilanterol (BREO ELLIPTA) 100-25 MCG/INH AEPB; Inhale 1 puff into the lungs daily.  Dispense: 60 each; Refill: 2  2. Cough  - fluticasone furoate-vilanterol (BREO ELLIPTA) 100-25 MCG/INH AEPB; Inhale 1 puff into the  lungs daily.  Dispense: 60 each; Refill: 2 - Ambulatory referral to ENT  The entirety of the information documented in the History of Present Illness, Review of Systems and Physical Exam were personally obtained by me. Portions of this information were initially documented by Lynford Humphrey, CMA and reviewed by me for thoroughness and accuracy.   F/u PRN    Trinna Post, PA-C  [facility]

## 2019-05-05 ENCOUNTER — Other Ambulatory Visit: Payer: Self-pay | Admitting: Family Medicine

## 2019-05-06 ENCOUNTER — Other Ambulatory Visit: Payer: Self-pay | Admitting: Family Medicine

## 2019-05-06 NOTE — Telephone Encounter (Signed)
Please review

## 2019-05-06 NOTE — Telephone Encounter (Signed)
Patient states that she is no longer taking this medication

## 2019-05-06 NOTE — Telephone Encounter (Signed)
This had been prescribed for depression and weight loss.  Is she still taking it?  It was removed from medication list at last visit

## 2019-05-13 ENCOUNTER — Telehealth: Payer: Self-pay | Admitting: Family Medicine

## 2019-05-13 DIAGNOSIS — R059 Cough, unspecified: Secondary | ICD-10-CM

## 2019-05-13 DIAGNOSIS — R05 Cough: Secondary | ICD-10-CM

## 2019-05-13 NOTE — Telephone Encounter (Signed)
Not that I'm aware. She can get it at the drive through. Will take 5-7 days to come back.

## 2019-05-13 NOTE — Telephone Encounter (Signed)
Patient advised.

## 2019-05-13 NOTE — Telephone Encounter (Signed)
I placed the order for COVID and is pending, I am guessing that is what they want. I will call the patient to let her know ENT wants her to get the test done. Am I missing something else?

## 2019-05-13 NOTE — Telephone Encounter (Signed)
 ENT will not schedule appointment for pt until she is tested for Covid. Can you arrange this ?Marland Kitchen She was referred to them for a cough

## 2019-05-15 ENCOUNTER — Other Ambulatory Visit: Payer: Self-pay

## 2019-05-15 DIAGNOSIS — R6889 Other general symptoms and signs: Secondary | ICD-10-CM | POA: Diagnosis not present

## 2019-05-15 DIAGNOSIS — Z20822 Contact with and (suspected) exposure to covid-19: Secondary | ICD-10-CM

## 2019-05-18 ENCOUNTER — Telehealth: Payer: Self-pay

## 2019-05-18 LAB — NOVEL CORONAVIRUS, NAA: SARS-CoV-2, NAA: NOT DETECTED

## 2019-05-18 NOTE — Telephone Encounter (Signed)
-----   Message from Virginia Crews, MD sent at 05/18/2019  4:22 PM EDT ----- Patient is negative for COVID.  If she was tested due to having symptoms or exposure, she should still quarantine for 14 days from exposure or 10 days from start of symptoms as test is not 100% accurate.

## 2019-05-18 NOTE — Telephone Encounter (Signed)
Patient advised as directed below. 

## 2019-05-21 DIAGNOSIS — R05 Cough: Secondary | ICD-10-CM | POA: Diagnosis not present

## 2019-05-21 DIAGNOSIS — J301 Allergic rhinitis due to pollen: Secondary | ICD-10-CM | POA: Diagnosis not present

## 2019-05-21 DIAGNOSIS — K219 Gastro-esophageal reflux disease without esophagitis: Secondary | ICD-10-CM | POA: Diagnosis not present

## 2019-06-05 ENCOUNTER — Encounter: Payer: Self-pay | Admitting: Family Medicine

## 2019-06-05 ENCOUNTER — Other Ambulatory Visit: Payer: Self-pay

## 2019-06-05 ENCOUNTER — Ambulatory Visit (INDEPENDENT_AMBULATORY_CARE_PROVIDER_SITE_OTHER): Payer: Medicare Other | Admitting: Family Medicine

## 2019-06-05 VITALS — BP 113/69 | HR 96 | Temp 98.0°F | Wt 213.4 lb

## 2019-06-05 DIAGNOSIS — F332 Major depressive disorder, recurrent severe without psychotic features: Secondary | ICD-10-CM | POA: Diagnosis not present

## 2019-06-05 DIAGNOSIS — F419 Anxiety disorder, unspecified: Secondary | ICD-10-CM

## 2019-06-05 DIAGNOSIS — I10 Essential (primary) hypertension: Secondary | ICD-10-CM

## 2019-06-05 DIAGNOSIS — E1159 Type 2 diabetes mellitus with other circulatory complications: Secondary | ICD-10-CM | POA: Insufficient documentation

## 2019-06-05 MED ORDER — VENLAFAXINE HCL ER 37.5 MG PO CP24: 37.5000 mg | ORAL_CAPSULE | Freq: Every day | ORAL | 3 refills | Status: DC

## 2019-06-05 MED ORDER — TRAZODONE HCL 100 MG PO TABS: 100.0000 mg | ORAL_TABLET | Freq: Every evening | ORAL | 1 refills | Status: DC | PRN

## 2019-06-05 NOTE — Assessment & Plan Note (Signed)
Well controlled Continue current medications Recheck metabolic panel F/u in 6 months  

## 2019-06-05 NOTE — Assessment & Plan Note (Signed)
Uncontrolled Wants to restart Klonopin Of note, patient has had 2 recent episodes of possible accidental OD with unresponsiveness and need for mechanical ventilation and intubation due to hypercarbic respiratory failure, so benzos are not an option in the future and would avoid other sedatives Not followed by psych and does not want referral Will Start Effexor 37.5 mg daily (resuming) Discussed potential side effects, incl GI upset, sexual dysfunction, increased anxiety, and SI Discussed that it can take 6-8 weeks to reach full efficacy Contracted for safety - no SI/HI Discussed synergistic effects of medications and therapy

## 2019-06-05 NOTE — Assessment & Plan Note (Signed)
Discussed importance of healthy weight management Discussed diet and exercise  

## 2019-06-05 NOTE — Progress Notes (Signed)
Patient: Kim Buchanan Female    DOB: 29-Dec-1950   68 y.o.   MRN: [id number] Visit Date: 06/05/2019  Today's Provider: Lavon Paganini, MD   Chief Complaint  Patient presents with  . Depression  . Gastroesophageal Reflux  . Insomnia   Subjective:    I, Tiburcio Pea, CMA, am acting as a scribe for Lavon Paganini, MD.    HPI Depression: Patient presents for a 3 month follow up. Last OV was on 03/04/2019. Patient advised to continue medication. She reports good compliance with treatment plan. She states symptoms are unchanged.  States Wellbutrin gave her palpitations She self-discontinued Effexor  Depression screen [facility] 2/9 03/04/2019 10/03/2018 03/13/2018  Decreased Interest 0 1 2  Down, Depressed, Hopeless 0 0 1  PHQ - 2 Score 0 1 3  Altered sleeping 2 - 1  Tired, decreased energy 2 - 1  Change in appetite 0 - 2  Feeling bad or failure about yourself  1 - 0  Trouble concentrating 2 - 1  Moving slowly or fidgety/restless 0 - 1  Suicidal thoughts 0 - 0  PHQ-9 Score 7 - 9  Difficult doing work/chores Not difficult at all - Very difficult  Some recent data might be hidden   GAD 7 : Generalized Anxiety Score 03/13/2018 01/01/2018 11/06/2017 10/07/2017  Nervous, Anxious, on Edge 3 3 1 1   Control/stop worrying 3 3 3 1   Worry too much - different things 3 3 3 1   Trouble relaxing 3 3 3  0  Restless 3 3 3  0  Easily annoyed or irritable 3 3 3 1   Afraid - awful might happen 3 3 2 1   Total GAD 7 Score 21 21 18 5   Anxiety Difficulty Somewhat difficult Extremely difficult Somewhat difficult Somewhat difficult   Insomnia:  Patient presents for a 3 month follow up. Last OV was on 03/04/2019. Patient started Trazodone.She reports good compliance with treatment plan. She states symptoms are slightly improved. She believes dose needs to be increased.    GERD, Follow up:  The patient was last seen for GERD 3 months ago. Changes made since that visit include advised to  continue medication. Patent states Dr. Pryor Ochoa changed Omeprazole to Pantoprazole.   She reports good compliance with treatment. She is not having side effects. .  She IS experiencing none. She is NOT experiencing abdominal bloating, belching, belching and eructation, bilious reflux, chest pain, choking on food, cough, deep pressure at base of neck, difficulty swallowing, dysphagia, early satiety, fullness after meals, heartburn, hematemesis, hoarseness, laryngitis, melena, midespigastric pain, nausea, need to clear throat frequently, nocturnal burning, odynophagia, regurgitation of undigested food, shortness of breath, symptoms primarily relate to meals, and lying down after meals, unexpected weight loss, upper abdominal discomfort, waterbrash or wheezing  ------------------------------------------------------------------------    No Known Allergies   Current Outpatient Medications:  .  aspirin 81 MG tablet, Take 81 mg by mouth daily. , Disp: , Rfl:  .  atorvastatin (LIPITOR) 20 MG tablet, TAKE 1 TABLET BY MOUTH AT  BEDTIME, Disp: 90 tablet, Rfl: 1 .  azelastine (ASTELIN) 0.1 % nasal spray, Place 2 sprays into both nostrils 2 (two) times daily. Use in each nostril as directed, Disp: , Rfl:  .  benzonatate (TESSALON) 100 MG capsule, Take 100 mg by mouth 2 (two) times daily as needed for cough., Disp: , Rfl:  .  fluticasone furoate-vilanterol (BREO ELLIPTA) 100-25 MCG/INH AEPB, Inhale 1 puff into the lungs daily., Disp: 60 each, Rfl:  2 .  Fluticasone-Salmeterol (ADVAIR) 100-50 MCG/DOSE AEPB, Inhale 1 puff into the lungs 2 (two) times daily., Disp: 1 each, Rfl: 3 .  hydrochlorothiazide (HYDRODIURIL) 12.5 MG tablet, , Disp: , Rfl:  .  isosorbide mononitrate (IMDUR) 30 MG 24 hr tablet, Take 1 tablet (30 mg total) by mouth daily., Disp: 90 tablet, Rfl: 3 .  lisinopril-hydrochlorothiazide (ZESTORETIC) 10-12.5 MG tablet, , Disp: , Rfl:  .  losartan (COZAAR) 50 MG tablet, , Disp: , Rfl:  .   montelukast (SINGULAIR) 10 MG tablet, , Disp: , Rfl:  .  pantoprazole (PROTONIX) 40 MG tablet, , Disp: , Rfl:  .  traZODone (DESYREL) 50 MG tablet, TAKE 1/2 TO 1 TABLET BY  MOUTH AT BEDTIME AS NEEDED  FOR SLEEP, Disp: 90 tablet, Rfl: 3  Review of Systems  Constitutional: Negative.   Respiratory: Negative.   Cardiovascular: Negative.   Gastrointestinal: Negative.   Musculoskeletal: Negative.     Social History   Tobacco Use  . Smoking status: Former Smoker    Packs/day: 0.50    Years: 47.00    Pack years: 23.50    Types: Cigarettes    Quit date: 11/29/2018    Years since quitting: 0.5  . Smokeless tobacco: Never Used  . Tobacco comment: started at age 34  Substance Use Topics  . Alcohol use: No    Alcohol/week: 0.0 standard drinks      Objective:   BP 113/69 (BP Location: Right Arm, Patient Position: Sitting, Cuff Size: Large)   Pulse 96   Temp 98 F (36.7 C) (Oral)   Wt 213 lb 6.4 oz (96.8 kg)   SpO2 97%   BMI 37.80 kg/m  Vitals:   06/05/19 0809  BP: 113/69  Pulse: 96  Temp: 98 F (36.7 C)  TempSrc: Oral  SpO2: 97%  Weight: 213 lb 6.4 oz (96.8 kg)     Physical Exam Vitals signs reviewed.  Constitutional:      General: She is not in acute distress.    Appearance: Normal appearance. She is well-developed. She is not diaphoretic.  HENT:     Head: Normocephalic and atraumatic.  Eyes:     General: No scleral icterus.    Conjunctiva/sclera: Conjunctivae normal.  Neck:     Musculoskeletal: Neck supple.     Thyroid: No thyromegaly.  Cardiovascular:     Rate and Rhythm: Normal rate and regular rhythm.     Pulses: Normal pulses.     Heart sounds: Normal heart sounds. No murmur.  Pulmonary:     Effort: Pulmonary effort is normal. No respiratory distress.     Breath sounds: Normal breath sounds. No wheezing or rales.  Abdominal:     General: There is no distension.     Palpations: Abdomen is soft.     Tenderness: There is no abdominal tenderness. There is  no guarding or rebound.  Musculoskeletal:        General: No deformity.     Right lower leg: No edema.     Left lower leg: No edema.  Lymphadenopathy:     Cervical: No cervical adenopathy.  Skin:    General: Skin is warm and dry.     Capillary Refill: Capillary refill takes less than 2 seconds.     Findings: No rash.  Neurological:     Mental Status: She is alert and oriented to person, place, and time.  Psychiatric:        Mood and Affect: Mood is anxious and depressed. Affect  is flat.        Speech: Speech normal.        Behavior: Behavior normal.        Thought Content: Thought content does not include homicidal or suicidal ideation.      No results found for any visits on 06/05/19.     Assessment & Plan   Problem List Items Addressed This Visit      Cardiovascular and Mediastinum   Hypertension    Well controlled Continue current medications Recheck metabolic panel F/u in 6 months       Relevant Medications   hydrochlorothiazide (HYDRODIURIL) 12.5 MG tablet   losartan (COZAAR) 50 MG tablet   Other Relevant Orders   Basic Metabolic Panel (BMET)     Other   Anxiety - Primary    Uncontrolled Wants to restart Klonopin Of note, patient has had 2 recent episodes of possible accidental OD with unresponsiveness and need for mechanical ventilation and intubation due to hypercarbic respiratory failure, so benzos are not an option in the future and would avoid other sedatives Not followed by psych and does not want referral Will Start Effexor 37.5 mg daily (resuming) Discussed potential side effects, incl GI upset, sexual dysfunction, increased anxiety, and SI Discussed that it can take 6-8 weeks to reach full efficacy Contracted for safety - no SI/HI Discussed synergistic effects of medications and therapy       Relevant Medications   venlafaxine XR (EFFEXOR XR) 37.5 MG 24 hr capsule   traZODone (DESYREL) 100 MG tablet   MDD (major depressive disorder)     Uncontrolled Wants to restart Klonopin Of note, patient has had 2 recent episodes of possible accidental OD with unresponsiveness and need for mechanical ventilation and intubation due to hypercarbic respiratory failure, so benzos are not an option in the future and would avoid other sedatives Not followed by psych and does not want referral Will Start Effexor 37.5 mg daily (resuming) Discussed potential side effects, incl GI upset, sexual dysfunction, increased anxiety, and SI Discussed that it can take 6-8 weeks to reach full efficacy Contracted for safety - no SI/HI Discussed synergistic effects of medications and therapy        Relevant Medications   venlafaxine XR (EFFEXOR XR) 37.5 MG 24 hr capsule   traZODone (DESYREL) 100 MG tablet   Morbid obesity ([place])    Discussed importance of healthy weight management Discussed diet and exercise           Return in about 3 months (around 09/05/2019) for MDD/GAD f/u.   The entirety of the information documented in the History of Present Illness, Review of Systems and Physical Exam were personally obtained by me. Portions of this information were initially documented by Tiburcio Pea, CMA and reviewed by me for thoroughness and accuracy.    , Dionne Bucy, MD MPH [facility]

## 2019-06-06 LAB — BASIC METABOLIC PANEL
BUN/Creatinine Ratio: 21 (ref 12–28)
BUN: 20 mg/dL (ref 8–27)
CO2: 25 mmol/L (ref 20–29)
Calcium: 8.6 mg/dL — ABNORMAL LOW (ref 8.7–10.3)
Chloride: 103 mmol/L (ref 96–106)
Creatinine, Ser: 0.96 mg/dL (ref 0.57–1.00)
GFR calc Af Amer: 71 mL/min/{1.73_m2} (ref >59)
GFR calc non Af Amer: 62 mL/min/{1.73_m2} (ref >59)
Glucose: 106 mg/dL — ABNORMAL HIGH (ref 65–99)
Potassium: 4.3 mmol/L (ref 3.5–5.2)
Sodium: 143 mmol/L (ref 134–144)

## 2019-06-19 DIAGNOSIS — K219 Gastro-esophageal reflux disease without esophagitis: Secondary | ICD-10-CM | POA: Diagnosis not present

## 2019-06-19 DIAGNOSIS — J301 Allergic rhinitis due to pollen: Secondary | ICD-10-CM | POA: Diagnosis not present

## 2019-06-19 DIAGNOSIS — R05 Cough: Secondary | ICD-10-CM | POA: Diagnosis not present

## 2019-06-19 DIAGNOSIS — J309 Allergic rhinitis, unspecified: Secondary | ICD-10-CM | POA: Diagnosis not present

## 2019-06-23 ENCOUNTER — Encounter: Payer: Self-pay | Admitting: Otolaryngology

## 2019-07-01 ENCOUNTER — Institutional Professional Consult (permissible substitution): Payer: Medicare Other | Admitting: Pulmonary Disease

## 2019-07-01 NOTE — Progress Notes (Deleted)
[facility] Consultation      Assessment and Plan:  Asthma with chronic bronchitis.  - Review of PFT shows normal pulmonary function without evidence of obstruction, I suspect her respiratory issues are more secondary to chronic bronchitis with asthma, exacerbated by ongoing smoking.  -Not using Symbicort regularly, with continued dyspnea on exertion, recommended that she use Symbicort 2 puffs twice daily.  Nicotine abuse. -Discussed the importance of smoke cessation, 3 minutes in discussion. She is not ready to quit.    No follow-ups on file.   Date: 07/01/2019  MRN# [id number] Kim Buchanan 11-27-1950   Kim Buchanan is a 68 y.o. old female seen in consultation for chief complaint of:    No chief complaint on file.   HPI:  Kim Buchanan is a 68 y.o. female  admitted to the hospital for AECOPD and accidental drug overdose requiring intubation, hospitalized from 11/02/17 to 2/1. Last visit she was advised smoking cessation, asked to use symbicort regularly and advised on smoking cessation.     continue Symbicort, her oxygen was discontinued except for nocturnal oxygen. Since her last visit in the office the patient was admitted to the hospital from 01/04/2018 to 3/22 for acute unintentional drug overdose during which time she was intubated. She continues to have dyspnea on exertion. She is taking symbicort about twice per week, recently started. She thinks it helps. She continues to smoke about half ppd. She is not really thinking about quitting. Her PFT was actually normal today without obstruction.   **PFT 06/05/18>> FVC is 128% protected, FEV1 is 123% predicted, there is no significant improvement bronchodilator.  Flow volume loop is normal.  Ratio is 77%.  TLC is 140%, VC is 128%, DLCO is 108%.  --Overall this shows normal pulmonary functions without evidence of obstructive lung disease.  **Desat walk 12/16/17>> on RA at rest. Sat 96% and HR 112. Walked  360 feet mild dyspnea with sat 96% and HR 110.  **CBC 02/24/13>>Eos=300.  **CXR 07/19/17>> images personally reviewed; hyperinflation consistent with  COPD  Social Hx:   Social History   Tobacco Use  . Smoking status: Former Smoker    Packs/day: 0.50    Years: 47.00    Pack years: 23.50    Types: Cigarettes    Quit date: 11/29/2018    Years since quitting: 0.5  . Smokeless tobacco: Never Used  . Tobacco comment: started at age 89  Substance Use Topics  . Alcohol use: No    Alcohol/week: 0.0 standard drinks  . Drug use: No   Medication:    Current Outpatient Medications:  .  aspirin 81 MG tablet, Take 81 mg by mouth daily. , Disp: , Rfl:  .  atorvastatin (LIPITOR) 20 MG tablet, TAKE 1 TABLET BY MOUTH AT  BEDTIME, Disp: 90 tablet, Rfl: 1 .  azelastine (ASTELIN) 0.1 % nasal spray, Place 2 sprays into both nostrils 2 (two) times daily. Use in each nostril as directed, Disp: , Rfl:  .  benzonatate (TESSALON) 100 MG capsule, Take 100 mg by mouth 2 (two) times daily as needed for cough., Disp: , Rfl:  .  hydrochlorothiazide (HYDRODIURIL) 12.5 MG tablet, , Disp: , Rfl:  .  isosorbide mononitrate (IMDUR) 30 MG 24 hr tablet, Take 1 tablet (30 mg total) by mouth daily., Disp: 90 tablet, Rfl: 3 .  losartan (COZAAR) 50 MG tablet, , Disp: , Rfl:  .  montelukast (SINGULAIR) 10 MG tablet, , Disp: , Rfl:  .  pantoprazole (PROTONIX) 40 MG tablet, , Disp: , Rfl:  .  traZODone (DESYREL) 100 MG tablet, Take 1 tablet (100 mg total) by mouth at bedtime as needed. for sleep, Disp: 90 tablet, Rfl: 1 .  venlafaxine XR (EFFEXOR XR) 37.5 MG 24 hr capsule, Take 1 capsule (37.5 mg total) by mouth daily with breakfast., Disp: 30 capsule, Rfl: 3   Allergies:  Lisinopril      LABORATORY PANEL:   CBC No results for input(s): WBC, HGB, HCT, PLT in the last 168 hours. ------------------------------------------------------------------------------------------------------------------  Chemistries  No results  for input(s): NA, K, CL, CO2, GLUCOSE, BUN, CREATININE, CALCIUM, MG, AST, ALT, ALKPHOS, BILITOT in the last 168 hours.  Invalid input(s): GFRCGP ------------------------------------------------------------------------------------------------------------------  Cardiac Enzymes No results for input(s): TROPONINI in the last 168 hours. ------------------------------------------------------------  RADIOLOGY:  No results found.     Thank  you for the consultation and for allowing [facility] to assist in the care of your patient. Our recommendations are noted above.  Please contact us if we can be of further service.  Marda Stalker, M.D., F.C.C.P.  Board Certified in Internal Medicine, Pulmonary Medicine, [place], and Sleep Medicine.  [facility] Office Number: [phone]   07/01/2019

## 2019-07-02 ENCOUNTER — Ambulatory Visit: Payer: Medicare Other | Admitting: Internal Medicine

## 2019-07-22 ENCOUNTER — Ambulatory Visit (INDEPENDENT_AMBULATORY_CARE_PROVIDER_SITE_OTHER): Payer: Medicare Other | Admitting: Gastroenterology

## 2019-07-22 ENCOUNTER — Other Ambulatory Visit: Payer: Self-pay

## 2019-07-22 ENCOUNTER — Encounter: Payer: Self-pay | Admitting: Gastroenterology

## 2019-07-22 VITALS — BP 119/56 | HR 86 | Temp 97.1°F | Ht 63.0 in | Wt 219.2 lb

## 2019-07-22 DIAGNOSIS — K2100 Gastro-esophageal reflux disease with esophagitis, without bleeding: Secondary | ICD-10-CM

## 2019-07-22 DIAGNOSIS — Z8619 Personal history of other infectious and parasitic diseases: Secondary | ICD-10-CM | POA: Diagnosis not present

## 2019-07-22 NOTE — H&P (View-Only) (Signed)
Primary Care Physician: Virginia Crews, MD  Primary Gastroenterologist:  Dr. Lucilla Lame  No chief complaint on file.   HPI: Kim Buchanan is a 68 y.o. female here for reflux.  The patient was seen by ENT and diagnosed with LPR.  The patient has a history of obesity and smoking.  Her med list includes pantoprazole 40 mg a day. She quite smoking over a year ago. The patient reports that she was told that she has asthma but she does not believe it because the inhalers did not work.  The patient also states that all of her symptoms started after she quit smoking.  The patient had a hepatitis C antibody positive last year with the viral load being checked and that was negative.  She has also been on a PPI and has not had any improvement in her cough.  The patient sees an outside pulmonologist of whom I do not have his notes but she states she had imaging and believes that she had a CT scan and she reported that it did not show any abnormalities which she uses to reiterate that she does not have asthma.  The patient's cough is present all the time not exacerbated with eating or drinking.  The patient reports that she has excessive amounts of burping and she does report drinking with a straw but does not chew gum or drink hot liquids.  She also reports that she is guilty of eating quickly.  Current Outpatient Medications  Medication Sig Dispense Refill  . aspirin 81 MG tablet Take 81 mg by mouth daily.     Marland Kitchen atorvastatin (LIPITOR) 20 MG tablet TAKE 1 TABLET BY MOUTH AT  BEDTIME 90 tablet 1  . azelastine (ASTELIN) 0.1 % nasal spray Place 2 sprays into both nostrils 2 (two) times daily. Use in each nostril as directed    . benzonatate (TESSALON) 100 MG capsule Take 100 mg by mouth 2 (two) times daily as needed for cough.    . hydrochlorothiazide (HYDRODIURIL) 12.5 MG tablet     . isosorbide mononitrate (IMDUR) 30 MG 24 hr tablet Take 1 tablet (30 mg total) by mouth daily. 90 tablet 3  .  losartan (COZAAR) 50 MG tablet     . montelukast (SINGULAIR) 10 MG tablet     . pantoprazole (PROTONIX) 40 MG tablet     . traZODone (DESYREL) 100 MG tablet Take 1 tablet (100 mg total) by mouth at bedtime as needed. for sleep 90 tablet 1  . venlafaxine XR (EFFEXOR XR) 37.5 MG 24 hr capsule Take 1 capsule (37.5 mg total) by mouth daily with breakfast. 30 capsule 3   No current facility-administered medications for this visit.     Allergies as of 07/22/2019 - Review Complete 06/05/2019  Allergen Reaction Noted  . Lisinopril Cough 06/05/2019    ROS:  General: Negative for anorexia, weight loss, fever, chills, fatigue, weakness. ENT: Negative for hoarseness, difficulty swallowing , nasal congestion. CV: Negative for chest pain, angina, palpitations, dyspnea on exertion, peripheral edema.  Respiratory: Negative for dyspnea at rest, dyspnea on exertion, cough, sputum, wheezing.  GI: See history of present illness. GU:  Negative for dysuria, hematuria, urinary incontinence, urinary frequency, nocturnal urination.  Endo: Negative for unusual weight change.    Physical Examination:   BP (!) 119/56   Pulse 86   Temp (!) 97.1 F (36.2 C) (Temporal)   Ht 5\' 3"  (1.6 m)   Wt 219 lb 3.2 oz (99.4 kg)  BMI 38.83 kg/m   General: Well-nourished, well-developed in no acute distress.  Eyes: No icterus. Conjunctivae pink. Heart: Regular rate and rhythm, no murmurs rubs or gallops.  Abdomen: Bowel sounds are normal, nontender, nondistended, no hepatosplenomegaly or masses, no abdominal bruits or hernia , no rebound or guarding.   Extremities: No lower extremity edema. No clubbing or deformities. Neuro: Alert and oriented x 3.  Grossly intact. Skin: Warm and dry, no jaundice.   Psych: Alert and cooperative, normal mood and affect.  Labs:    Imaging Studies: No results found.  Assessment and Plan:   Kim Buchanan is a 68 y.o. y/o female who comes in today with a report of reflux  symptoms on her vocal cords by ENT.  The patient reports that her inhalers did not help her cough or shortness of breath therefore refuses to believe that she has asthma.  She also says that her CT scan which I do not have access to did not show her to have any abnormalities.  The patient was told that asthma cannot be seen on CT scans.  She does report having pulmonary function test but does not know the results of those.  The patient's cough has not gotten better with a PPI and I do not believe that reflux is the cause of her cough although it may be the cause of her LPR.  The patient will be set up for an EGD to rule out any structural abnormalities such as a hiatal hernia indicating reflux or esophagitis.  The patient will also have her blood sent off for H. pylori and a hepatitis C viral load to make sure that her viral load has remained negative.  The patient also has dyspepsia with a lot of burping.  Patient has been told to avoid drinking with a straw and eating quickly to decrease the burping.  The patient has been explained the plan and agrees with it.    Lucilla Lame, MD. [name]   Note: This dictation was prepared with Dragon dictation along with smaller phrase technology. Any transcriptional errors that result from this process are unintentional.

## 2019-07-22 NOTE — Progress Notes (Signed)
Primary Care Physician: Kim Crews, MD  Primary Gastroenterologist:  Dr. Lucilla Lame  No chief complaint on file.   HPI: Kim Buchanan is a 68 y.o. female here for reflux.  The patient was seen by ENT and diagnosed with LPR.  The patient has a history of obesity and smoking.  Her med list includes pantoprazole 40 mg a day. She quite smoking over a year ago. The patient reports that she was told that she has asthma but she does not believe it because the inhalers did not work.  The patient also states that all of her symptoms started after she quit smoking.  The patient had a hepatitis C antibody positive last year with the viral load being checked and that was negative.  She has also been on a PPI and has not had any improvement in her cough.  The patient sees an outside pulmonologist of whom I do not have his notes but she states she had imaging and believes that she had a CT scan and she reported that it did not show any abnormalities which she uses to reiterate that she does not have asthma.  The patient's cough is present all the time not exacerbated with eating or drinking.  The patient reports that she has excessive amounts of burping and she does report drinking with a straw but does not chew gum or drink hot liquids.  She also reports that she is guilty of eating quickly.  Current Outpatient Medications  Medication Sig Dispense Refill  . aspirin 81 MG tablet Take 81 mg by mouth daily.     Marland Kitchen atorvastatin (LIPITOR) 20 MG tablet TAKE 1 TABLET BY MOUTH AT  BEDTIME 90 tablet 1  . azelastine (ASTELIN) 0.1 % nasal spray Place 2 sprays into both nostrils 2 (two) times daily. Use in each nostril as directed    . benzonatate (TESSALON) 100 MG capsule Take 100 mg by mouth 2 (two) times daily as needed for cough.    . hydrochlorothiazide (HYDRODIURIL) 12.5 MG tablet     . isosorbide mononitrate (IMDUR) 30 MG 24 hr tablet Take 1 tablet (30 mg total) by mouth daily. 90 tablet 3  .  losartan (COZAAR) 50 MG tablet     . montelukast (SINGULAIR) 10 MG tablet     . pantoprazole (PROTONIX) 40 MG tablet     . traZODone (DESYREL) 100 MG tablet Take 1 tablet (100 mg total) by mouth at bedtime as needed. for sleep 90 tablet 1  . venlafaxine XR (EFFEXOR XR) 37.5 MG 24 hr capsule Take 1 capsule (37.5 mg total) by mouth daily with breakfast. 30 capsule 3   No current facility-administered medications for this visit.     Allergies as of 07/22/2019 - Review Complete 06/05/2019  Allergen Reaction Noted  . Lisinopril Cough 06/05/2019    ROS:  General: Negative for anorexia, weight loss, fever, chills, fatigue, weakness. ENT: Negative for hoarseness, difficulty swallowing , nasal congestion. CV: Negative for chest pain, angina, palpitations, dyspnea on exertion, peripheral edema.  Respiratory: Negative for dyspnea at rest, dyspnea on exertion, cough, sputum, wheezing.  GI: See history of present illness. GU:  Negative for dysuria, hematuria, urinary incontinence, urinary frequency, nocturnal urination.  Endo: Negative for unusual weight change.    Physical Examination:   BP (!) 119/56   Pulse 86   Temp (!) 97.1 F (36.2 C) (Temporal)   Ht 5\' 3"  (1.6 m)   Wt 219 lb 3.2 oz (99.4 kg)  BMI 38.83 kg/m   General: Well-nourished, well-developed in no acute distress.  Eyes: No icterus. Conjunctivae pink. Heart: Regular rate and rhythm, no murmurs rubs or gallops.  Abdomen: Bowel sounds are normal, nontender, nondistended, no hepatosplenomegaly or masses, no abdominal bruits or hernia , no rebound or guarding.   Extremities: No lower extremity edema. No clubbing or deformities. Neuro: Alert and oriented x 3.  Grossly intact. Skin: Warm and dry, no jaundice.   Psych: Alert and cooperative, normal mood and affect.  Labs:    Imaging Studies: No results found.  Assessment and Plan:   Kim Buchanan is a 68 y.o. y/o female who comes in today with a report of reflux  symptoms on her vocal cords by ENT.  The patient reports that her inhalers did not help her cough or shortness of breath therefore refuses to believe that she has asthma.  She also says that her CT scan which I do not have access to did not show her to have any abnormalities.  The patient was told that asthma cannot be seen on CT scans.  She does report having pulmonary function test but does not know the results of those.  The patient's cough has not gotten better with a PPI and I do not believe that reflux is the cause of her cough although it may be the cause of her LPR.  The patient will be set up for an EGD to rule out any structural abnormalities such as a hiatal hernia indicating reflux or esophagitis.  The patient will also have her blood sent off for H. pylori and a hepatitis C viral load to make sure that her viral load has remained negative.  The patient also has dyspepsia with a lot of burping.  Patient has been told to avoid drinking with a straw and eating quickly to decrease the burping.  The patient has been explained the plan and agrees with it.    Lucilla Lame, MD. [name]   Note: This dictation was prepared with Dragon dictation along with smaller phrase technology. Any transcriptional errors that result from this process are unintentional.

## 2019-07-24 LAB — HCV RNA QUANT: Hepatitis C Quantitation: NOT DETECTED IU/mL

## 2019-07-24 LAB — H. PYLORI ANTIBODY, IGG: H. pylori, IgG AbS: 0.5 Index Value (ref 0.00–0.79)

## 2019-07-27 ENCOUNTER — Telehealth: Payer: Self-pay

## 2019-07-27 NOTE — Telephone Encounter (Signed)
-----   Message from Lucilla Lame, MD sent at 07/25/2019  9:55 AM EDT ----- Let the patient know that her hepatitis C continues to remain negative and no further work-up for that is needed.  Her H. pylori is also negative.

## 2019-07-27 NOTE — Telephone Encounter (Signed)
LVM for pt to return my call.

## 2019-07-29 NOTE — Telephone Encounter (Signed)
Left vm again today for pt to return my call.  

## 2019-08-06 ENCOUNTER — Encounter: Payer: Self-pay | Admitting: Family Medicine

## 2019-08-06 ENCOUNTER — Ambulatory Visit (INDEPENDENT_AMBULATORY_CARE_PROVIDER_SITE_OTHER): Payer: Medicare Other | Admitting: Family Medicine

## 2019-08-06 ENCOUNTER — Other Ambulatory Visit: Payer: Self-pay

## 2019-08-06 VITALS — BP 108/74 | HR 64 | Temp 97.3°F | Wt 221.6 lb

## 2019-08-06 DIAGNOSIS — F331 Major depressive disorder, recurrent, moderate: Secondary | ICD-10-CM | POA: Diagnosis not present

## 2019-08-06 DIAGNOSIS — F411 Generalized anxiety disorder: Secondary | ICD-10-CM

## 2019-08-06 DIAGNOSIS — Z23 Encounter for immunization: Secondary | ICD-10-CM

## 2019-08-06 MED ORDER — VENLAFAXINE HCL ER 75 MG PO CP24: 75.0000 mg | ORAL_CAPSULE | Freq: Every day | ORAL | 3 refills | Status: DC

## 2019-08-06 NOTE — Assessment & Plan Note (Signed)
Discussed importance of healthy weight management Discussed diet and exercise  Complicated by HTN, HLD, OA Will refer to Bariatric surgery, but discussed that she likely needs to try some diet and exercise first

## 2019-08-06 NOTE — Assessment & Plan Note (Signed)
Uncontrolled Of note, patient has had 2 episodes of possible accidental OD with unresponsiveness and need for mechanical ventilation and intubation due to hypercarbic respiratory failure, so benzos are not an option in the future and would avoid other sedatives Not followed by psych and does not want referral Will increase Effexor to 75mg  daily Contracted for safety - no SI/HI Discussed synergistic effects of medications and therapy  F/u in 3 months

## 2019-08-06 NOTE — Patient Instructions (Signed)
Increase Effexor to 75 mg daily.

## 2019-08-06 NOTE — Assessment & Plan Note (Signed)
Uncontrolled Tolerating Effexor well Will increase dose to 75mg  daily Contracted for safety - no SI/HI Discussed synergism between therapy and medications

## 2019-08-06 NOTE — Progress Notes (Signed)
Patient: Kim Buchanan Female    DOB: [birth year]   68 y.o.   MRN: [id number] Visit Date: 08/06/2019  Today's Provider: Lavon Paganini, MD   Chief Complaint  Patient presents with  . Depression  . Anxiety   Subjective:    I, Tiburcio Pea, CMA, am acting as a scribe for Lavon Paganini, MD.    HPI Depression & Anxiety: Patient presents for a 3 month follow up. Last OV was on 06/05/2019. Patient advised to continue Effexor 37.5 mg. She reports good compliance with treatment plan. She states symptoms are unchanged.  States that she still worries a lot.  Her obesity worries her.  She would like to increase dose of Effexor.  She would like to consider bariatric surgery. She is trying to start taking the stairs instead of the elevator.  Tried topamax in the past without success.  GAD 7 : Generalized Anxiety Score 08/06/2019 06/05/2019 03/13/2018 01/01/2018  Nervous, Anxious, on Edge 1 2 3 3   Control/stop worrying 1 2 3 3   Worry too much - different things 3 3 3 3   Trouble relaxing 3 3 3 3   Restless 0 0 3 3  Easily annoyed or irritable 0 2 3 3   Afraid - awful might happen 3 3 3 3   Total GAD 7 Score 11 15 21 21   Anxiety Difficulty Somewhat difficult Somewhat difficult Somewhat difficult Extremely difficult   Depression screen [facility] 2/9 08/06/2019 06/05/2019 03/04/2019  Decreased Interest 3 3 0  Down, Depressed, Hopeless 3 3 0  PHQ - 2 Score 6 6 0  Altered sleeping 3 2 2   Tired, decreased energy 3 3 2   Change in appetite 3 3 0  Feeling bad or failure about yourself  3 0 1  Trouble concentrating 0 3 2  Moving slowly or fidgety/restless 0 3 0  Suicidal thoughts 0 0 0  PHQ-9 Score 18 20 7   Difficult doing work/chores Somewhat difficult Somewhat difficult Not difficult at all  Some recent data might be hidden   Allergies  Allergen Reactions  . Lisinopril Cough     Current Outpatient Medications:  .  aspirin 81 MG tablet, Take 81 mg by mouth daily. , Disp: , Rfl:  .   atorvastatin (LIPITOR) 20 MG tablet, TAKE 1 TABLET BY MOUTH AT  BEDTIME, Disp: 90 tablet, Rfl: 1 .  hydrochlorothiazide (HYDRODIURIL) 12.5 MG tablet, , Disp: , Rfl:  .  isosorbide mononitrate (IMDUR) 30 MG 24 hr tablet, Take 1 tablet (30 mg total) by mouth daily., Disp: 90 tablet, Rfl: 3 .  losartan (COZAAR) 50 MG tablet, , Disp: , Rfl:  .  montelukast (SINGULAIR) 10 MG tablet, , Disp: , Rfl:  .  pantoprazole (PROTONIX) 40 MG tablet, , Disp: , Rfl:  .  traZODone (DESYREL) 100 MG tablet, Take 1 tablet (100 mg total) by mouth at bedtime as needed. for sleep, Disp: 90 tablet, Rfl: 1 .  venlafaxine XR (EFFEXOR-XR) 75 MG 24 hr capsule, Take 1 capsule (75 mg total) by mouth daily with breakfast., Disp: 30 capsule, Rfl: 3  Review of Systems  Constitutional: Negative.   Respiratory: Negative.   Cardiovascular: Negative.   Musculoskeletal: Negative.   Psychiatric/Behavioral: The patient is nervous/anxious.        Depression     Social History   Tobacco Use  . Smoking status: Former Smoker    Packs/day: 0.50    Years: 47.00    Pack years: 23.50    Types: Cigarettes  Quit date: 11/29/2018    Years since quitting: 0.6  . Smokeless tobacco: Never Used  . Tobacco comment: started at age 30  Substance Use Topics  . Alcohol use: No    Alcohol/week: 0.0 standard drinks      Objective:   BP 108/74 (BP Location: Right Arm, Patient Position: Sitting, Cuff Size: Large)   Pulse 64   Temp (!) 97.3 F (36.3 C) (Temporal)   Wt 221 lb 9.6 oz (100.5 kg)   SpO2 96%   BMI 39.25 kg/m  Vitals:   08/06/19 0807  BP: 108/74  Pulse: 64  Temp: (!) 97.3 F (36.3 C)  TempSrc: Temporal  SpO2: 96%  Weight: 221 lb 9.6 oz (100.5 kg)  Body mass index is 39.25 kg/m.   Physical Exam Vitals signs reviewed.  Constitutional:      General: She is not in acute distress.    Appearance: Normal appearance. She is well-developed. She is not diaphoretic.  HENT:     Head: Normocephalic and atraumatic.   Eyes:     General: No scleral icterus.    Conjunctiva/sclera: Conjunctivae normal.  Neck:     Musculoskeletal: Neck supple.     Thyroid: No thyromegaly.  Cardiovascular:     Rate and Rhythm: Normal rate and regular rhythm.     Pulses: Normal pulses.     Heart sounds: Normal heart sounds. No murmur.  Pulmonary:     Effort: Pulmonary effort is normal. No respiratory distress.     Breath sounds: Normal breath sounds. No wheezing, rhonchi or rales.  Musculoskeletal:     Right lower leg: Edema (trace) present.     Left lower leg: Edema (trace) present.  Lymphadenopathy:     Cervical: No cervical adenopathy.  Skin:    General: Skin is warm and dry.     Capillary Refill: Capillary refill takes less than 2 seconds.     Findings: No rash.  Neurological:     Mental Status: She is alert and oriented to person, place, and time. Mental status is at baseline.  Psychiatric:        Mood and Affect: Mood is anxious. Affect is flat.        Speech: Speech normal.        Behavior: Behavior normal.        Thought Content: Thought content does not include homicidal or suicidal ideation.      No results found for any visits on 08/06/19.     Assessment & Plan   Problem List Items Addressed This Visit      Other   GAD (generalized anxiety disorder)    Uncontrolled Of note, patient has had 2 episodes of possible accidental OD with unresponsiveness and need for mechanical ventilation and intubation due to hypercarbic respiratory failure, so benzos are not an option in the future and would avoid other sedatives Not followed by psych and does not want referral Will increase Effexor to 75mg  daily Contracted for safety - no SI/HI Discussed synergistic effects of medications and therapy  F/u in 3 months       Relevant Medications   venlafaxine XR (EFFEXOR-XR) 75 MG 24 hr capsule   MDD (major depressive disorder) - Primary    Uncontrolled Tolerating Effexor well Will increase dose to 75mg   daily Contracted for safety - no SI/HI Discussed synergism between therapy and medications      Relevant Medications   venlafaxine XR (EFFEXOR-XR) 75 MG 24 hr capsule   Morbid obesity ([place])  Discussed importance of healthy weight management Discussed diet and exercise  Complicated by HTN, HLD, OA Will refer to Bariatric surgery, but discussed that she likely needs to try some diet and exercise first      Relevant Orders   Amb Referral to Bariatric Surgery       Return in about 3 months (around 11/06/2019) for chronic disease f/u.   The entirety of the information documented in the History of Present Illness, Review of Systems and Physical Exam were personally obtained by me. Portions of this information were initially documented by Tiburcio Pea, CMA and reviewed by me for thoroughness and accuracy.    Zandyr Barnhill, Dionne Bucy, MD MPH [facility]

## 2019-08-07 ENCOUNTER — Other Ambulatory Visit: Payer: Self-pay | Admitting: Family Medicine

## 2019-08-07 ENCOUNTER — Other Ambulatory Visit
Admission: RE | Admit: 2019-08-07 | Discharge: 2019-08-07 | Disposition: A | Discharge status: Home / Self Care | Payer: Medicare Other | Source: Ambulatory Visit | Attending: Gastroenterology | Admitting: Gastroenterology

## 2019-08-07 DIAGNOSIS — Z01812 Encounter for preprocedural laboratory examination: Secondary | ICD-10-CM | POA: Insufficient documentation

## 2019-08-07 DIAGNOSIS — Z20828 Contact with and (suspected) exposure to other viral communicable diseases: Secondary | ICD-10-CM | POA: Diagnosis not present

## 2019-08-07 DIAGNOSIS — E782 Mixed hyperlipidemia: Secondary | ICD-10-CM

## 2019-08-07 DIAGNOSIS — I25118 Atherosclerotic heart disease of native coronary artery with other forms of angina pectoris: Secondary | ICD-10-CM

## 2019-08-07 LAB — SARS CORONAVIRUS 2 (TAT 6-24 HRS): SARS Coronavirus 2: NEGATIVE

## 2019-08-07 NOTE — Telephone Encounter (Signed)
Contacted pt and notified her of her lab results.

## 2019-08-10 ENCOUNTER — Other Ambulatory Visit: Payer: Self-pay | Admitting: Family Medicine

## 2019-08-10 MED ORDER — MONTELUKAST SODIUM 10 MG PO TABS: 10.0000 mg | ORAL_TABLET | Freq: Every day | ORAL | 3 refills | Status: DC

## 2019-08-10 MED ORDER — VENLAFAXINE HCL ER 75 MG PO CP24: 75.0000 mg | ORAL_CAPSULE | Freq: Every day | ORAL | 3 refills | Status: DC

## 2019-08-10 NOTE — Telephone Encounter (Signed)
[facility] faxed refill request for the following medications:  venlafaxine XR (EFFEXOR-XR) 75 MG 24 hr capsule  montelukast (SINGULAIR) 10 MG tablet   Please advise.

## 2019-08-11 ENCOUNTER — Ambulatory Visit: Payer: Medicare Other | Admitting: Anesthesiology

## 2019-08-11 ENCOUNTER — Encounter
Admission: RE | Disposition: A | Discharge status: Home / Self Care | Payer: Self-pay | Source: Home / Self Care | Attending: Gastroenterology

## 2019-08-11 ENCOUNTER — Other Ambulatory Visit: Payer: Self-pay

## 2019-08-11 ENCOUNTER — Ambulatory Visit
Admission: RE | Admit: 2019-08-11 | Discharge: 2019-08-11 | Disposition: A | Discharge status: Home / Self Care | Payer: Medicare Other | Attending: Gastroenterology | Admitting: Gastroenterology

## 2019-08-11 ENCOUNTER — Encounter: Payer: Self-pay | Admitting: *Deleted

## 2019-08-11 DIAGNOSIS — Z87891 Personal history of nicotine dependence: Secondary | ICD-10-CM | POA: Insufficient documentation

## 2019-08-11 DIAGNOSIS — K219 Gastro-esophageal reflux disease without esophagitis: Secondary | ICD-10-CM | POA: Diagnosis not present

## 2019-08-11 DIAGNOSIS — J449 Chronic obstructive pulmonary disease, unspecified: Secondary | ICD-10-CM | POA: Insufficient documentation

## 2019-08-11 DIAGNOSIS — I10 Essential (primary) hypertension: Secondary | ICD-10-CM | POA: Insufficient documentation

## 2019-08-11 DIAGNOSIS — Z79899 Other long term (current) drug therapy: Secondary | ICD-10-CM | POA: Insufficient documentation

## 2019-08-11 DIAGNOSIS — K2900 Acute gastritis without bleeding: Secondary | ICD-10-CM

## 2019-08-11 DIAGNOSIS — K2100 Gastro-esophageal reflux disease with esophagitis, without bleeding: Secondary | ICD-10-CM

## 2019-08-11 DIAGNOSIS — E785 Hyperlipidemia, unspecified: Secondary | ICD-10-CM | POA: Insufficient documentation

## 2019-08-11 DIAGNOSIS — K449 Diaphragmatic hernia without obstruction or gangrene: Secondary | ICD-10-CM | POA: Insufficient documentation

## 2019-08-11 DIAGNOSIS — Z888 Allergy status to other drugs, medicaments and biological substances status: Secondary | ICD-10-CM | POA: Insufficient documentation

## 2019-08-11 DIAGNOSIS — R12 Heartburn: Secondary | ICD-10-CM | POA: Diagnosis present

## 2019-08-11 DIAGNOSIS — K2970 Gastritis, unspecified, without bleeding: Secondary | ICD-10-CM | POA: Diagnosis not present

## 2019-08-11 DIAGNOSIS — Z6838 Body mass index (BMI) 38.0-38.9, adult: Secondary | ICD-10-CM | POA: Diagnosis not present

## 2019-08-11 DIAGNOSIS — K319 Disease of stomach and duodenum, unspecified: Secondary | ICD-10-CM | POA: Diagnosis not present

## 2019-08-11 DIAGNOSIS — Z7982 Long term (current) use of aspirin: Secondary | ICD-10-CM | POA: Insufficient documentation

## 2019-08-11 DIAGNOSIS — E669 Obesity, unspecified: Secondary | ICD-10-CM | POA: Diagnosis not present

## 2019-08-11 DIAGNOSIS — D649 Anemia, unspecified: Secondary | ICD-10-CM | POA: Diagnosis not present

## 2019-08-11 DIAGNOSIS — G4730 Sleep apnea, unspecified: Secondary | ICD-10-CM | POA: Diagnosis not present

## 2019-08-11 HISTORY — PX: ESOPHAGOGASTRODUODENOSCOPY (EGD) WITH PROPOFOL: SHX5813

## 2019-08-11 SURGERY — ESOPHAGOGASTRODUODENOSCOPY (EGD) WITH PROPOFOL
Anesthesia: General

## 2019-08-11 MED ORDER — SODIUM CHLORIDE 0.9 % IV SOLN
INTRAVENOUS | Status: DC
  Administered 2019-08-11: 09:00:00 via INTRAVENOUS

## 2019-08-11 MED ORDER — PROPOFOL 10 MG/ML IV BOLUS
INTRAVENOUS | Status: DC | PRN
  Administered 2019-08-11 (×2): 30 mg via INTRAVENOUS
  Administered 2019-08-11: 20 mg via INTRAVENOUS

## 2019-08-11 MED ORDER — LIDOCAINE HCL (CARDIAC) PF 100 MG/5ML IV SOSY
PREFILLED_SYRINGE | INTRAVENOUS | Status: DC | PRN
  Administered 2019-08-11: 30 mg via INTRAVENOUS

## 2019-08-11 NOTE — Op Note (Signed)
[facility] Patient Name: Kim Buchanan Procedure Date: 08/11/2019 10:31 AM MRN: [id number] Account #: [id number] Date of Birth: [birth year] Admit Type: Outpatient Age: 68 Room: [facility] ENDO ROOM 4 Gender: Female Note Status: Finalized Procedure:            Upper GI endoscopy Indications:          Heartburn Providers:            Lucilla Lame MD, MD Referring MD:         Dionne Bucy. Bacigalupo (Referring MD) Medicines:            Propofol per Anesthesia Complications:        No immediate complications. Procedure:            Pre-Anesthesia Assessment:                       - Prior to the procedure, a History and Physical was                        performed, and patient medications and allergies were                        reviewed. The patient's tolerance of previous                        anesthesia was also reviewed. The risks and benefits of                        the procedure and the sedation options and risks were                        discussed with the patient. All questions were                        answered, and informed consent was obtained. Prior                        Anticoagulants: The patient has taken no previous                        anticoagulant or antiplatelet agents. ASA Grade                        Assessment: II - A patient with mild systemic disease.                        After reviewing the risks and benefits, the patient was                        deemed in satisfactory condition to undergo the                        procedure.                       After obtaining informed consent, the endoscope was                        passed under direct vision. Throughout the procedure,  the patient's blood pressure, pulse, and oxygen                        saturations were monitored continuously. The Endoscope                        was introduced through the mouth, and advanced to the   second part of duodenum. The upper GI endoscopy was                        accomplished without difficulty. The patient tolerated                        the procedure well. Findings:      A small hiatal hernia was present.      Localized minimal inflammation characterized by erythema was found in       the gastric antrum. Biopsies were taken with a cold forceps for       histology.      The examined duodenum was normal. Impression:           - Small hiatal hernia.                       - Gastritis. Biopsied.                       - Normal examined duodenum. Recommendation:       - Discharge patient to home.                       - Resume previous diet.                       - Continue present medications.                       - Await pathology results. Procedure Code(s):    --- Professional ---                       [id number], Esophagogastroduodenoscopy, flexible, transoral;                        with biopsy, single or multiple Diagnosis Code(s):    --- Professional ---                       R12, Heartburn                       K29.70, Gastritis, unspecified, without bleeding CPT copyright 2019 American Medical Association. All rights reserved. The codes documented in this report are preliminary and upon coder review may  be revised to meet current compliance requirements. Lucilla Lame MD, MD 08/11/2019 10:40:54 AM This report has been signed electronically. Number of Addenda: 0 Note Initiated On: 08/11/2019 10:31 AM Estimated Blood Loss: Estimated blood loss: none.      [facility]

## 2019-08-11 NOTE — Anesthesia Preprocedure Evaluation (Addendum)
Anesthesia Evaluation  Patient identified by MRN, date of birth, ID band Patient awake    Reviewed: Allergy & Precautions, H&P , NPO status , Patient's Chart, lab work & pertinent test results  Airway Mallampati: II  TM Distance: >3 FB     Dental  (+) Missing, Edentulous Upper   Pulmonary sleep apnea (does not use CPAP) , COPD, former smoker,           Cardiovascular hypertension, + dysrhythmias (LBBB)      Neuro/Psych PSYCHIATRIC DISORDERS Anxiety Depression negative neurological ROS     GI/Hepatic Neg liver ROS, GERD  ,  Endo/Other  negative endocrine ROS  Renal/GU negative Renal ROS  negative genitourinary   Musculoskeletal   Abdominal   Peds  Hematology  (+) Blood dyscrasia, anemia ,   Anesthesia Other Findings Obese  Past Medical History: No date: Anxiety No date: Arthritis     Comment:  lower back No date: COPD (chronic obstructive pulmonary disease) (HCC) No date: Depression No date: GERD (gastroesophageal reflux disease) No date: Hyperlipidemia No date: Leaky heart valve     Comment:  sees Dr Humphrey Rolls No date: Osteoporosis No date: Panic attack No date: PTSD (post-traumatic stress disorder) No date: Rectal fistula No date: Sleep apnea No date: Wears dentures     Comment:  full upper  Past Surgical History: 2014: COLONOSCOPY 10/01/2016: COLONOSCOPY WITH PROPOFOL; N/A     Comment:  Procedure: COLONOSCOPY WITH PROPOFOL;  Surgeon: Lucilla Lame, MD;  Location: [place];  Service:               Endoscopy;  Laterality: N/A; 12/17/2016: CYSTOCELE REPAIR; N/A     Comment:  Procedure: ANTERIOR REPAIR (CYSTOCELE);  Surgeon: Brayton Mars, MD;  Location: [facility];  Service:               Gynecology;  Laterality: N/A; 10/01/2016: POLYPECTOMY     Comment:  Procedure: POLYPECTOMY;  Surgeon: Lucilla Lame, MD;                Location: [place];  Service:  Endoscopy;; 06/28/2015: RECTAL SURGERY     Comment:  Rectal prolapse, laparoscopic rectopexy the               coldCharmont, MD; [facility] No date: TUBAL LIGATION 12/17/2016: VAGINAL HYSTERECTOMY; Bilateral     Comment:  Procedure: HYSTERECTOMY VAGINAL WITH BILATERAL SALPINGO               OOPHERECTOMY;  Surgeon: Brayton Mars, MD;                Location: [facility];  Service: Gynecology;  Laterality:               Bilateral;  BMI    Body Mass Index: 38.79 kg/m      Reproductive/Obstetrics negative OB ROS                            Anesthesia Physical Anesthesia Plan  ASA: III  Anesthesia Plan: General   Post-op Pain Management:    Induction:   PONV Risk Score and Plan: Propofol infusion and TIVA  Airway Management Planned: Natural Airway and Nasal Cannula  Additional Equipment:   Intra-op Plan:   Post-operative Plan:   Informed Consent: I have  reviewed the patients History and Physical, chart, labs and discussed the procedure including the risks, benefits and alternatives for the proposed anesthesia with the patient or authorized representative who has indicated his/her understanding and acceptance.     Dental Advisory Given  Plan Discussed with: Anesthesiologist, CRNA and Surgeon  Anesthesia Plan Comments:        Anesthesia Quick Evaluation

## 2019-08-11 NOTE — Anesthesia Post-op Follow-up Note (Signed)
Anesthesia QCDR form completed.        

## 2019-08-11 NOTE — Interval H&P Note (Signed)
History and Physical Interval Note:  08/11/2019 9:38 AM  Kim Buchanan  has presented today for surgery, with the diagnosis of gerd K21.9.  The various methods of treatment have been discussed with the patient and family. After consideration of risks, benefits and other options for treatment, the patient has consented to  Procedure(s): ESOPHAGOGASTRODUODENOSCOPY (EGD) WITH PROPOFOL (N/A) as a surgical intervention.  The patient's history has been reviewed, patient examined, no change in status, stable for surgery.  I have reviewed the patient's chart and labs.  Questions were answered to the patient's satisfaction.     Ailed Defibaugh [employer]

## 2019-08-11 NOTE — Transfer of Care (Signed)
Immediate Anesthesia Transfer of Care Note  Patient: Kim Buchanan  Procedure(s) Performed: ESOPHAGOGASTRODUODENOSCOPY (EGD) WITH PROPOFOL (N/A )  Patient Location: PACU and Endoscopy Unit  Anesthesia Type:General  Level of Consciousness: awake, alert  and oriented  Airway & Oxygen Therapy: Patient Spontanous Breathing  Post-op Assessment: Report given to RN and Post -op Vital signs reviewed and stable  Post vital signs: Reviewed and stable  Last Vitals:  Vitals Value Taken Time  BP 76/53 08/11/19 1049  Temp 36.1 C 08/11/19 1045  Pulse 87 08/11/19 1050  Resp 25 08/11/19 1050  SpO2 95 % 08/11/19 1050  Vitals shown include unvalidated device data.  Last Pain:  Vitals:   08/11/19 1045  TempSrc: Tympanic  PainSc: 0-No pain         Complications: No apparent anesthesia complications

## 2019-08-11 NOTE — Anesthesia Postprocedure Evaluation (Signed)
Anesthesia Post Note  Patient: CLEON MENARD  Procedure(s) Performed: ESOPHAGOGASTRODUODENOSCOPY (EGD) WITH PROPOFOL (N/A )  Patient location during evaluation: Endoscopy Anesthesia Type: General Level of consciousness: awake and alert Pain management: pain level controlled Vital Signs Assessment: post-procedure vital signs reviewed and stable Respiratory status: spontaneous breathing, nonlabored ventilation, respiratory function stable and patient connected to nasal cannula oxygen Cardiovascular status: blood pressure returned to baseline and stable Postop Assessment: no apparent nausea or vomiting Anesthetic complications: no     Last Vitals:  Vitals:   08/11/19 1105 08/11/19 1115  BP: (!) 101/57 (!) 121/58  Pulse:    Resp: 18   Temp:    SpO2:      Last Pain:  Vitals:   08/11/19 1115  TempSrc:   PainSc: 0-No pain                 Martha Clan

## 2019-08-12 ENCOUNTER — Encounter: Payer: Self-pay | Admitting: Gastroenterology

## 2019-08-12 LAB — SURGICAL PATHOLOGY

## 2019-08-14 ENCOUNTER — Other Ambulatory Visit: Payer: Self-pay

## 2019-08-14 ENCOUNTER — Other Ambulatory Visit
Admission: RE | Admit: 2019-08-14 | Discharge: 2019-08-14 | Disposition: A | Discharge status: Home / Self Care | Payer: Medicare Other | Attending: Pulmonary Disease | Admitting: Pulmonary Disease

## 2019-08-14 ENCOUNTER — Encounter: Payer: Self-pay | Admitting: Gastroenterology

## 2019-08-14 ENCOUNTER — Encounter: Payer: Self-pay | Admitting: Pulmonary Disease

## 2019-08-14 ENCOUNTER — Ambulatory Visit (INDEPENDENT_AMBULATORY_CARE_PROVIDER_SITE_OTHER): Payer: Medicare Other | Admitting: Pulmonary Disease

## 2019-08-14 VITALS — BP 130/76 | HR 88 | Temp 97.8°F | Ht 63.0 in | Wt 222.0 lb

## 2019-08-14 DIAGNOSIS — R0602 Shortness of breath: Secondary | ICD-10-CM | POA: Diagnosis not present

## 2019-08-14 DIAGNOSIS — R5383 Other fatigue: Secondary | ICD-10-CM | POA: Diagnosis not present

## 2019-08-14 DIAGNOSIS — J4520 Mild intermittent asthma, uncomplicated: Secondary | ICD-10-CM | POA: Diagnosis not present

## 2019-08-14 LAB — COMPREHENSIVE METABOLIC PANEL
ALT: 42 U/L (ref 0–44)
AST: 34 U/L (ref 15–41)
Albumin: 3.6 g/dL (ref 3.5–5.0)
Alkaline Phosphatase: 72 U/L (ref 38–126)
Anion gap: 9 (ref 5–15)
BUN: 14 mg/dL (ref 8–23)
CO2: 27 mmol/L (ref 22–32)
Calcium: 8.5 mg/dL — ABNORMAL LOW (ref 8.9–10.3)
Chloride: 104 mmol/L (ref 98–111)
Creatinine, Ser: 0.93 mg/dL (ref 0.44–1.00)
GFR calc Af Amer: >60 mL/min (ref >60)
GFR calc non Af Amer: >60 mL/min (ref >60)
Glucose, Bld: 116 mg/dL — ABNORMAL HIGH (ref 70–99)
Potassium: 3.9 mmol/L (ref 3.5–5.1)
Sodium: 140 mmol/L (ref 135–145)
Total Bilirubin: 0.7 mg/dL (ref 0.3–1.2)
Total Protein: 7.1 g/dL (ref 6.5–8.1)

## 2019-08-14 LAB — CBC WITH DIFFERENTIAL/PLATELET
Abs Immature Granulocytes: 0.03 10*3/uL (ref 0.00–0.07)
Basophils Absolute: 0 10*3/uL (ref 0.0–0.1)
Basophils Relative: 1 %
Eosinophils Absolute: 0.1 10*3/uL (ref 0.0–0.5)
Eosinophils Relative: 2 %
HCT: 37.3 % (ref 36.0–46.0)
Hemoglobin: 11.8 g/dL — ABNORMAL LOW (ref 12.0–15.0)
Immature Granulocytes: 0 %
Lymphocytes Relative: 26 %
Lymphs Abs: 1.9 10*3/uL (ref 0.7–4.0)
MCH: 27.7 pg (ref 26.0–34.0)
MCHC: 31.6 g/dL (ref 30.0–36.0)
MCV: 87.6 fL (ref 80.0–100.0)
Monocytes Absolute: 0.6 10*3/uL (ref 0.1–1.0)
Monocytes Relative: 8 %
Neutro Abs: 4.7 10*3/uL (ref 1.7–7.7)
Neutrophils Relative %: 63 %
Platelets: 242 10*3/uL (ref 150–400)
RBC: 4.26 MIL/uL (ref 3.87–5.11)
RDW: 14.7 % (ref 11.5–15.5)
WBC: 7.4 10*3/uL (ref 4.0–10.5)
nRBC: 0 % (ref 0.0–0.2)

## 2019-08-14 LAB — TSH: TSH: 2.627 u[IU]/mL (ref 0.350–4.500)

## 2019-08-14 LAB — T4, FREE: Free T4: 0.73 ng/dL (ref 0.61–1.12)

## 2019-08-14 NOTE — Progress Notes (Signed)
Subjective:    Patient ID: Kim Buchanan, female    DOB: 11-09-1950, 68 y.o.   MRN: [id number]  HPI The patient is a 68 year old former smoker (quit 11/2018) who presents for evaluation of dyspnea.  She has previously been seen by Dr. Ashby Dawes for the same issue.  She last saw Dr. Ashby Dawes in August 2019.  Initially she was seen with the impression of COPD however her pulmonary function testing performed on 05 June 2018 showed that her numbers were normal in all aspects of flow, diffusion capacity and volumes.  The patient was well over 100 percentile on all numbers.  She has also had ambulatory oximetry that showed no oxygen desaturations with exercise.  Despite the fact that she has had issues with with dyspnea described as far back as 2019 she states today that she has been having worsening issues over the last 2 months.  She notes that it is not only dyspnea but also fatigue with fatigue being the most salient symptom.  Notes that she has had increasing weight over the last 7 months, looking back at her records she has had a 40 pound weight gain over the last year.  The patient notes that she has been told that she has "heart blockages" but has not understood well what the nature of her heart disease is.  She currently would like to get a second opinion with regards to this issue.  She does not use any inhalers.  She had an admission to [facility] in March 2019 for what appeared to be an exacerbation of asthmatic bronchitis and accidental medication overdose.  She has not required oxygen or inhaler since that time.  As noted above her PFTs are entirely normal.  She has not had any fevers, chills or sweats.  No cough or sputum production.  No hemoptysis.  She does have issues with mild snoring according to her husband who accompanies her today.  She does not have apneic episodes.  She apparently has been evaluated in this regard in the past.  She states that she cannot exert herself because she  "gives out".  Mostly the limiting issue is fatigue.  She does not describe paroxysmal nocturnal dyspnea.  Some orthopnea.  She also gets lower extremity edema particularly towards the end of the day.  No chest pain, no calf tenderness.  She does not endorse any other issue.  Past medical history, surgical history and family history have been reviewed.  They are as noted.  Social history she smoked half a pack of cigarettes per day for 47 years quit in February 2020.  I have reviewed her available laboratory data and radiologic data.  Review of Systems  Constitutional: Positive for fatigue and unexpected weight change (40 pound weight gain over the last year). Negative for chills and fever.  HENT: Negative.   Eyes: Negative.   Respiratory: Positive for shortness of breath.   Cardiovascular: Positive for leg swelling.  Gastrointestinal: Negative.   Endocrine: Negative.   Genitourinary: Negative.   Musculoskeletal: Negative.   Skin: Negative.   Allergic/Immunologic: Negative.   Neurological: Negative.   Hematological: Negative.   Psychiatric/Behavioral: Negative.   All other systems reviewed and are negative.      Objective:   Physical Exam Vitals signs and nursing note reviewed.  Constitutional:      Appearance: She is morbidly obese. She is not ill-appearing.  HENT:     Head: Normocephalic and atraumatic.     Right Ear: External ear normal.  Left Ear: External ear normal.     Nose:     Comments: Nose/mouth/throat not examined due to masking requirements for COVID 19. Eyes:     General: No scleral icterus.    Conjunctiva/sclera: Conjunctivae normal.     Pupils: Pupils are equal, round, and reactive to light.  Neck:     Musculoskeletal: Neck supple.     Thyroid: No thyromegaly.     Vascular: No JVD.     Trachea: Trachea and phonation normal.  Cardiovascular:     Rate and Rhythm: Normal rate and regular rhythm.     Pulses: Normal pulses.     Heart sounds: Normal heart  sounds. No murmur.  Pulmonary:     Effort: Pulmonary effort is normal. No respiratory distress.     Breath sounds: Normal breath sounds. No wheezing or rhonchi.  Abdominal:     General: Abdomen is protuberant.     Palpations: Abdomen is soft.  Musculoskeletal: Normal range of motion.        General: No tenderness.     Right lower leg: 1+ Edema present.     Left lower leg: 1+ Edema present.     Comments: No lower extremity asymmetry.  No calf tenderness.  Lymphadenopathy:     Cervical: No cervical adenopathy.  Skin:    General: Skin is warm and dry.  Neurological:     General: No focal deficit present.     Mental Status: She is alert and oriented to person, place, and time.  Psychiatric:        Mood and Affect: Mood normal.        Behavior: Behavior normal. Behavior is cooperative.       Assessment & Plan:   1.  Dyspnea (shortness of breath) with fatigue: She has normal pulmonary function to include diffusion capacity her prior issues with respiratory status may have been related to mild intermittent asthma with exacerbation which is not an active issue presently.  Her dyspnea may likely be multifactorial 1 salient issue is that she has gained 40 pounds of weight over the last year (documented) and she may be developing issues with obesity with obesity hypoventilation.  In addition she cites some cardiac issues that are ill characterized.  I have not been able to find documentation of this.  She would like to have a second opinion in this regard.  To evaluate this issue will obtain CBC with CMP and thyroid panel to exclude potential anemia, renal/hepatic disease and/or thyroid disease as the cause for her symptoms (particularly fatigue).  We will also obtain 2D echo to evaluate for potential cardiac issues.  We will see her in follow-up in 3 to 4 weeks time, she is to contact us prior to that time should any new difficulties arise.  2.  Mild intermittent asthma: She is not requiring  maintenance inhalers at present.  Has not had any recent exacerbations and definitely none since March 2019.  That exacerbation may have been due to to medication misadventure at that time.  Her pulmonary function testing was excellent in August 2019 at present do not believe that she requires repeat of that as her pulmonary exam today was benign.  3.  Morbid obesity with BMI of 39.3: This issue adds complexity to her management.  Weight loss has been recommended.   Total visit time 40 minutes.  This chart was dictated using voice recognition software/Dragon.  Despite best efforts to proofread, errors can occur which can change the meaning.  Any change was purely unintentional.

## 2019-08-14 NOTE — Patient Instructions (Signed)
1.  We will check blood work to check on your blood count, kidney function, liver function and thyroid function.  Problems with these issues could cause shortness of breath.  2.  Your breathing tests just a year ago were normal (supranormal) and not consistent with COPD.  You probably had an asthmatic exacerbation but it does not appear that you have that issue currently.  3.  We will check a heart test (2D echo), we are referring you to Dr. Rockey Situ, heart doctor, at your request.  I think you should have your heart checked as well.  4.  We will see you back in follow-up in 3 to 4 weeks time call sooner should any new difficulties arise.

## 2019-08-17 NOTE — Progress Notes (Signed)
Cardiology Office Note  Date:  08/19/2019   ID:  Kim Buchanan, DOB June 02, 1951, MRN [id number]  PCP:  Kim Crews, MD   Chief Complaint  Patient presents with  . New Patient (Initial Visit)    Per Dr Kim Buchanan for hx blockages. Patient c/o SOB and swelling in feet. Meds reviewed verbally with patient.     HPI:  [name] is a 68 year old woman with past medical history of Asthma with exacerbation Morbid obesity Prior smoker Referred by Dr. Patsey Buchanan for shortness of breath  Reports having shortness of breath and fatigue SOB "since weight been going up" Feels her weight should be around 145 pounds, current weight 223 pounds No regular exercise, lots of sweet tea, high carbohydrate foods  Echocardiogram has been ordered by Dr. Patsey Buchanan Scheduled for next week  Discussed prior cardiac history  Stress test 2014, Dr. Humphrey Buchanan, in Kim computer No ischemia, low risk scan Ejection fraction 60%  Remote echo with Kim Buchanan, results unclear Lots of stress testing by Kim Buchanan, she is unaware of Kim results Was told by Kim Buchanan " does not look good", but she does not know Kim details.  Reports he sometimes does two stress test a year  Labs reviewed Borderline diabetes Total chol 90  EKG personally reviewed by myself on todays visit NSR rate 93 bpm, IVCD/incomplete left bundle branch block No significant change in EKG compared to 2014    PMH:   has a past medical history of Anxiety, Arthritis, COPD (chronic obstructive pulmonary disease) ([place]), Depression, GERD (gastroesophageal reflux disease), Hyperlipidemia, Leaky heart valve, Osteoporosis, Panic attack, PTSD (post-traumatic stress disorder), Rectal fistula, Sleep apnea, and Wears dentures.  PSH:    Past Surgical History:  Procedure Laterality Date  . COLONOSCOPY  2014  . COLONOSCOPY WITH PROPOFOL N/A 10/01/2016   Procedure: COLONOSCOPY WITH PROPOFOL;  Surgeon: Kim Lame, MD;  Location: [facility];  Service:  Endoscopy;  Laterality: N/A;  . CYSTOCELE REPAIR N/A 12/17/2016   Procedure: ANTERIOR REPAIR (CYSTOCELE);  Surgeon: Kim Mars, MD;  Location: [facility];  Service: Gynecology;  Laterality: N/A;  . ESOPHAGOGASTRODUODENOSCOPY (EGD) WITH PROPOFOL N/A 08/11/2019   Procedure: ESOPHAGOGASTRODUODENOSCOPY (EGD) WITH PROPOFOL;  Surgeon: Kim Lame, MD;  Location: [facility];  Service: Endoscopy;  Laterality: N/A;  . POLYPECTOMY  10/01/2016   Procedure: POLYPECTOMY;  Surgeon: Kim Lame, MD;  Location: [place];  Service: Endoscopy;;  . RECTAL SURGERY  06/28/2015   Rectal prolapse, laparoscopic rectopexy Kim coldCharmont, MD; Kim Buchanan  . TUBAL LIGATION    . VAGINAL HYSTERECTOMY Bilateral 12/17/2016   Procedure: HYSTERECTOMY VAGINAL WITH BILATERAL SALPINGO OOPHERECTOMY;  Surgeon: Kim Mars, MD;  Location: [facility];  Service: Gynecology;  Laterality: Bilateral;    Current Outpatient Medications  Medication Sig Dispense Refill  . aspirin 81 MG tablet Take 81 mg by mouth daily.     Marland Kitchen atorvastatin (LIPITOR) 20 MG tablet TAKE 1 TABLET BY MOUTH AT  BEDTIME 90 tablet 3  . hydrochlorothiazide (HYDRODIURIL) 12.5 MG tablet     . isosorbide mononitrate (IMDUR) 30 MG 24 hr tablet TAKE 1 TABLET BY MOUTH  DAILY 90 tablet 3  . losartan (COZAAR) 50 MG tablet     . montelukast (SINGULAIR) 10 MG tablet Take 1 tablet (10 mg total) by mouth at bedtime. 90 tablet 3  . pantoprazole (PROTONIX) 40 MG tablet     . traZODone (DESYREL) 100 MG tablet TAKE 1 TABLET BY MOUTH AT  BEDTIME AS NEEDED. FOR  SLEEP 90  tablet 3  . venlafaxine XR (EFFEXOR-XR) 75 MG 24 hr capsule Take 1 capsule (75 mg total) by mouth daily with breakfast. 30 capsule 3   No current facility-administered medications for this visit.      Allergies:   Lisinopril   Social History:  Kim patient  reports that she quit smoking about 8 months ago. Her smoking use included cigarettes. She has a 23.50 pack-year smoking history. She  has never used smokeless tobacco. She reports that she does not drink alcohol or use drugs.   Family History:   family history includes Diabetes in her brother, brother, mother, sister, and sister; Healthy in her sister; Heart disease in her brother and father; Hypertension in her father; Lymphoma in her brother and brother.    Review of Systems: Review of Systems  Constitutional: Negative.        Weight gain  HENT: Negative.   Respiratory: Positive for shortness of breath.   Cardiovascular: Negative.   Gastrointestinal: Negative.   Musculoskeletal: Negative.   Neurological: Negative.   Psychiatric/Behavioral: Negative.   All other systems reviewed and are negative.   PHYSICAL EXAM: VS:  BP 120/66 (BP Location: Right Arm, Patient Position: Sitting, Cuff Size: Normal)   Pulse 93   Ht 5\' 3"  (1.6 m)   Wt 223 lb 8 oz (101.4 kg)   BMI 39.59 kg/m  , BMI Body mass index is 39.59 kg/m. GEN: Well nourished, well developed, in no acute distress HEENT: normal Neck: no JVD, carotid bruits, or masses Cardiac: RRR; no murmurs, rubs, or gallops,no edema  Respiratory:  clear to auscultation bilaterally, normal work of breathing GI: soft, nontender, nondistended, + BS MS: no deformity or atrophy Skin: warm and dry, no rash Neuro:  Strength and sensation are intact Psych: euthymic mood, full affect  Recent Labs: 08/14/2019: ALT 42; BUN 14; Creatinine, Ser 0.93; Hemoglobin 11.8; Platelets 242; Potassium 3.9; Sodium 140; TSH 2.627    Lipid Panel Lab Results  Component Value Date   CHOL 80 (L) 10/03/2018   HDL 44 10/03/2018   LDLCALC 15 10/03/2018   TRIG 106 10/03/2018      Wt Readings from Last 3 Encounters:  08/19/19 223 lb 8 oz (101.4 kg)  08/14/19 222 lb (100.7 kg)  08/11/19 219 lb (99.3 kg)     ASSESSMENT AND PLAN:  Problem List Items Addressed This Visit      Cardiology Problems   Hypertension   Combined fat and carbohydrate induced hyperlipemia     Other   GAD  (generalized anxiety disorder)   Morbid obesity (HCC)   COPD (chronic obstructive pulmonary disease) ([place]) - Primary     Shortness of breath Remote smoking history, morbid obesity, deconditioning likely contributing Echocardiogram pending, ordered by pulmonary Reports having numerous stress test over Kim past several years These records have been requested Previous results reviewed with her, stress test from 2014 Clinical exam essentially benign apart from morbid obesity  Morbid obesity Discussed exercise program to Kim hospital, lung Works program She is not interested at this time Long discussion with her concerning need to increase her exercise, activities.  Discussed with her Also discussed diet She will cut out sweet tea is changed to water  Hypertension Blood pressure is well controlled on today's visit. No changes made to Kim medications.  Hyperlipidemia Cholesterol is at goal on Kim current lipid regimen. No changes to Kim medications were made.  Borderline diabetes Reports that this was new to her, all of Kim sugars going  back 1 year have been above 100 Stressed importance of changing her soda and sweet tea to water Following a low carbohydrate diet, starting a walking program  Addendum We did receive records after she had left Kim office Total cholesterol 69 Stress test performed April 02, 2019 with detail of ischemia in Kim LAD territory normal ejection fraction, EF 63% Cardiac CTA was recommended -Echocardiogram April 02, 2019 normal LV function, moderately dilated left atrium mild TR, mild PR, trace to mild AI, mild to moderate MR -----> scheduled to have echocardiogram next week in Kim hospital We will discuss Kim above findings with her as well as echocardiogram findings We can offer additional ischemic work-up if symptoms get worse High risk of breast attenuation artifact  Disposition:   F/U  12 months   Total encounter time more than 60 minutes  Greater  than 50% was spent in counseling and coordination of care with Kim patient    Signed, Esmond Plants, M.D., Ph.D. [place], [place]

## 2019-08-19 ENCOUNTER — Encounter: Payer: Self-pay | Admitting: Cardiovascular Disease

## 2019-08-19 ENCOUNTER — Ambulatory Visit (INDEPENDENT_AMBULATORY_CARE_PROVIDER_SITE_OTHER): Payer: Medicare Other | Admitting: Cardiovascular Disease

## 2019-08-19 ENCOUNTER — Other Ambulatory Visit: Payer: Self-pay

## 2019-08-19 VITALS — BP 120/66 | HR 93 | Ht 63.0 in | Wt 223.5 lb

## 2019-08-19 DIAGNOSIS — R0602 Shortness of breath: Secondary | ICD-10-CM

## 2019-08-19 DIAGNOSIS — I10 Essential (primary) hypertension: Secondary | ICD-10-CM

## 2019-08-19 DIAGNOSIS — E782 Mixed hyperlipidemia: Secondary | ICD-10-CM

## 2019-08-19 DIAGNOSIS — F411 Generalized anxiety disorder: Secondary | ICD-10-CM

## 2019-08-19 DIAGNOSIS — J410 Simple chronic bronchitis: Secondary | ICD-10-CM | POA: Diagnosis not present

## 2019-08-19 NOTE — Patient Instructions (Addendum)
We will request records from Dr. Humphrey Rolls, Cardiology, All stress test, any other testing  Medication Instructions:  No changes  If you need a refill on your cardiac medications before your next appointment, please call your pharmacy.    Lab work: No new labs needed   If you have labs (blood work) drawn today and your tests are completely normal, you will receive your results only by: Marland Kitchen MyChart Message (if you have MyChart) OR . A paper copy in the mail If you have any lab test that is abnormal or we need to change your treatment, we will call you to review the results.   Testing/Procedures: No new testing needed   Follow-Up: At [facility], you and your health needs are our priority.  As part of our continuing mission to provide you with exceptional heart care, we have created designated Provider Care Teams.  These Care Teams include your primary Cardiologist (physician) and Advanced Practice Providers (APPs -  Physician Assistants and Nurse Practitioners) who all work together to provide you with the care you need, when you need it.  . You will need a follow up appointment in 12 months .   Please call our office 2 months in advance to schedule this appointment.    . Providers on your designated Care Team:   . Murray Hodgkins, NP . Christell Faith, PA-C . Marrianne Mood, PA-C  Any Other Special Instructions Will Be Listed Below (If Applicable).  For educational health videos Log in to : [email or web address] Or : [email or web address], password : [email or web address]

## 2019-08-24 ENCOUNTER — Ambulatory Visit: Payer: Medicare Other

## 2019-08-25 ENCOUNTER — Telehealth: Payer: Self-pay | Admitting: Pulmonary Disease

## 2019-08-25 NOTE — Telephone Encounter (Signed)
lmam for [facility] [phone] to resc this Echo

## 2019-08-25 NOTE — Telephone Encounter (Signed)
[facility] can you guys help with this, as Suanne Marker is currently out.

## 2019-08-26 NOTE — Telephone Encounter (Signed)
Appt was resc by [facility] they contacted the patient

## 2019-09-04 ENCOUNTER — Ambulatory Visit
Admission: RE | Admit: 2019-09-04 | Discharge: 2019-09-04 | Disposition: A | Discharge status: Home / Self Care | Payer: Medicare Other | Source: Ambulatory Visit | Attending: Pulmonary Disease | Admitting: Pulmonary Disease

## 2019-09-04 ENCOUNTER — Other Ambulatory Visit: Payer: Self-pay

## 2019-09-04 ENCOUNTER — Ambulatory Visit: Payer: Medicare Other | Admitting: Pulmonary Disease

## 2019-09-04 DIAGNOSIS — R0602 Shortness of breath: Secondary | ICD-10-CM

## 2019-09-04 DIAGNOSIS — R0600 Dyspnea, unspecified: Secondary | ICD-10-CM | POA: Diagnosis not present

## 2019-09-04 DIAGNOSIS — E785 Hyperlipidemia, unspecified: Secondary | ICD-10-CM | POA: Diagnosis not present

## 2019-09-04 NOTE — Progress Notes (Signed)
*  PRELIMINARY RESULTS* Echocardiogram 2D Echocardiogram has been performed.  Kim Buchanan Mehak Roskelley 09/04/2019, 11:31 AM

## 2019-09-07 ENCOUNTER — Encounter: Payer: Self-pay | Admitting: *Deleted

## 2019-09-07 ENCOUNTER — Other Ambulatory Visit: Payer: Self-pay | Admitting: Cardiovascular Disease

## 2019-09-07 ENCOUNTER — Telehealth: Payer: Self-pay | Admitting: Cardiovascular Disease

## 2019-09-07 DIAGNOSIS — Z01812 Encounter for preprocedural laboratory examination: Secondary | ICD-10-CM

## 2019-09-07 DIAGNOSIS — R0602 Shortness of breath: Secondary | ICD-10-CM

## 2019-09-07 DIAGNOSIS — I420 Dilated cardiomyopathy: Secondary | ICD-10-CM

## 2019-09-07 NOTE — Telephone Encounter (Signed)
I have spoken with the patient regarding her pre-procedure instructions for her heart cath with Dr. Rockey Situ on Friday. She voices understanding and is agreeable.

## 2019-09-07 NOTE — Telephone Encounter (Signed)
I spoke with the patient. She is aware of her echo results.  I have advised her of her need for a cardiac cath per Dr. Rockey Situ to further evaluate her coronary circulation. The patient is aware she may come into the office prior to her cath to discuss further with Dr. Rockey Situ if needed or we can proceed with scheduling her for a cath on Friday this week.  Per the patient, she is ok with proceeding with a cardiac cath this week without an office visit.  She is aware she will need to come tomorrow to have her pre-procedure labs and COVID swab done.  I have advised her I will need to call her back once I confirm a time for her cath on Friday.  The patient voices understanding of the above and is agreeable.

## 2019-09-07 NOTE — Telephone Encounter (Signed)
Secure chat message received from Dr. Rockey Situ on Friday evening ~ 4:50 pm stating Dr. Patsey Berthold spoke with him about the patient's recent echo.  EF down to 25-30 %.  Per Dr. Rockey Situ, the patient will need to be set up for a cardiac cath, preferrably Friday this week (interventional back up from Dr. Fletcher Anon). We can work her in the schedule to be seen today/ Tuesday if she would like to discuss in office prior to scheduling.   Attempted to call the patient. No answer- I left a message to please call back today if possible.

## 2019-09-07 NOTE — Telephone Encounter (Signed)
The patient has been scheduled for a Right & Left Heart Cath with Dr. Rockey Situ on 09/11/19 at 8:30 am.      You are scheduled for a Cardiac Catheterization on Friday, November 20 with Dr. Ida Rogue.  1. Please arrive at the [place] at [facility] at 7:30 am (This time is one hour before your procedure to ensure your preparation). Free valet parking service is available.   Special note: Every effort is made to have your procedure done on time. Please understand that emergencies sometimes delay scheduled procedures.  2. Diet: Do not eat solid foods after midnight. You may have clear liquids until 5am on the day of the procedure.  3. Labs:   Pre Procedure labs: You will need to have blood drawn on Tuesday, November 17 at  [facility] Entrance, Go to 1st desk on your right to register.  Address: [address], [place] [address]  Open: 8am - 5pm  Phone: [phone]. You do not need to be fasting.  Pre Procedure COVID swab: Tuesday November 17 (12:30 pm- 2:30 pm) [facility] Entrance- Drive up test  4. Medication instructions in preparation for your procedure:  Contrast Allergy: No  HOLD hydrochlorothiazide (HCTZ) the morning of your procedure  On the morning of your procedure, take your Aspirin and any morning medicines NOT listed above.  You may use sips of water.   5. Plan for one night stay--bring personal belongings. 6. Bring a current list of your medications and current insurance cards. 7. You MUST have a responsible person to drive you home. 8. Someone MUST be with you the first 24 hours after you arrive home or your discharge will be delayed. 9. Please wear clothes that are easy to get on and off and wear slip-on shoes.  Thank you for allowing [place] to care for you!   -- [facility]

## 2019-09-08 ENCOUNTER — Other Ambulatory Visit
Admission: RE | Admit: 2019-09-08 | Discharge: 2019-09-08 | Disposition: A | Discharge status: Home / Self Care | Payer: Medicare Other | Source: Ambulatory Visit | Attending: Cardiovascular Disease | Admitting: Cardiovascular Disease

## 2019-09-08 ENCOUNTER — Other Ambulatory Visit: Payer: Self-pay

## 2019-09-08 DIAGNOSIS — Z01812 Encounter for preprocedural laboratory examination: Secondary | ICD-10-CM

## 2019-09-08 DIAGNOSIS — R0602 Shortness of breath: Secondary | ICD-10-CM

## 2019-09-08 DIAGNOSIS — Z20828 Contact with and (suspected) exposure to other viral communicable diseases: Secondary | ICD-10-CM | POA: Insufficient documentation

## 2019-09-08 LAB — BASIC METABOLIC PANEL
Anion gap: 9 (ref 5–15)
BUN: 15 mg/dL (ref 8–23)
CO2: 21 mmol/L — ABNORMAL LOW (ref 22–32)
Calcium: 8.5 mg/dL — ABNORMAL LOW (ref 8.9–10.3)
Chloride: 110 mmol/L (ref 98–111)
Creatinine, Ser: 0.85 mg/dL (ref 0.44–1.00)
GFR calc Af Amer: >60 mL/min (ref >60)
GFR calc non Af Amer: >60 mL/min (ref >60)
Glucose, Bld: 189 mg/dL — ABNORMAL HIGH (ref 70–99)
Potassium: 3.8 mmol/L (ref 3.5–5.1)
Sodium: 140 mmol/L (ref 135–145)

## 2019-09-08 LAB — CBC WITH DIFFERENTIAL/PLATELET
Abs Immature Granulocytes: 0.02 10*3/uL (ref 0.00–0.07)
Basophils Absolute: 0 10*3/uL (ref 0.0–0.1)
Basophils Relative: 0 %
Eosinophils Absolute: 0.1 10*3/uL (ref 0.0–0.5)
Eosinophils Relative: 2 %
HCT: 36.9 % (ref 36.0–46.0)
Hemoglobin: 11.7 g/dL — ABNORMAL LOW (ref 12.0–15.0)
Immature Granulocytes: 0 %
Lymphocytes Relative: 29 %
Lymphs Abs: 2.2 10*3/uL (ref 0.7–4.0)
MCH: 27.7 pg (ref 26.0–34.0)
MCHC: 31.7 g/dL (ref 30.0–36.0)
MCV: 87.4 fL (ref 80.0–100.0)
Monocytes Absolute: 0.5 10*3/uL (ref 0.1–1.0)
Monocytes Relative: 6 %
Neutro Abs: 4.7 10*3/uL (ref 1.7–7.7)
Neutrophils Relative %: 63 %
Platelets: 258 10*3/uL (ref 150–400)
RBC: 4.22 MIL/uL (ref 3.87–5.11)
RDW: 15 % (ref 11.5–15.5)
WBC: 7.6 10*3/uL (ref 4.0–10.5)
nRBC: 0 % (ref 0.0–0.2)

## 2019-09-09 LAB — SARS CORONAVIRUS 2 (TAT 6-24 HRS): SARS Coronavirus 2: NEGATIVE

## 2019-09-11 ENCOUNTER — Ambulatory Visit
Admission: RE | Admit: 2019-09-11 | Discharge: 2019-09-11 | Disposition: A | Discharge status: Home / Self Care | Payer: Medicare Other | Attending: Cardiovascular Disease | Admitting: Cardiovascular Disease

## 2019-09-11 ENCOUNTER — Encounter
Admission: RE | Disposition: A | Discharge status: Home / Self Care | Payer: Self-pay | Source: Home / Self Care | Attending: Cardiovascular Disease

## 2019-09-11 ENCOUNTER — Encounter: Payer: Self-pay | Admitting: *Deleted

## 2019-09-11 DIAGNOSIS — Z79899 Other long term (current) drug therapy: Secondary | ICD-10-CM | POA: Diagnosis not present

## 2019-09-11 DIAGNOSIS — G4730 Sleep apnea, unspecified: Secondary | ICD-10-CM | POA: Insufficient documentation

## 2019-09-11 DIAGNOSIS — Z87891 Personal history of nicotine dependence: Secondary | ICD-10-CM | POA: Insufficient documentation

## 2019-09-11 DIAGNOSIS — E782 Mixed hyperlipidemia: Secondary | ICD-10-CM | POA: Diagnosis not present

## 2019-09-11 DIAGNOSIS — J449 Chronic obstructive pulmonary disease, unspecified: Secondary | ICD-10-CM | POA: Diagnosis present

## 2019-09-11 DIAGNOSIS — R0602 Shortness of breath: Secondary | ICD-10-CM | POA: Diagnosis not present

## 2019-09-11 DIAGNOSIS — Z8249 Family history of ischemic heart disease and other diseases of the circulatory system: Secondary | ICD-10-CM | POA: Insufficient documentation

## 2019-09-11 DIAGNOSIS — I10 Essential (primary) hypertension: Secondary | ICD-10-CM | POA: Diagnosis not present

## 2019-09-11 DIAGNOSIS — I2510 Atherosclerotic heart disease of native coronary artery without angina pectoris: Secondary | ICD-10-CM | POA: Diagnosis not present

## 2019-09-11 DIAGNOSIS — Z7982 Long term (current) use of aspirin: Secondary | ICD-10-CM | POA: Insufficient documentation

## 2019-09-11 DIAGNOSIS — I428 Other cardiomyopathies: Secondary | ICD-10-CM

## 2019-09-11 DIAGNOSIS — K219 Gastro-esophageal reflux disease without esophagitis: Secondary | ICD-10-CM | POA: Insufficient documentation

## 2019-09-11 DIAGNOSIS — M1990 Unspecified osteoarthritis, unspecified site: Secondary | ICD-10-CM | POA: Insufficient documentation

## 2019-09-11 DIAGNOSIS — F411 Generalized anxiety disorder: Secondary | ICD-10-CM | POA: Insufficient documentation

## 2019-09-11 DIAGNOSIS — I420 Dilated cardiomyopathy: Secondary | ICD-10-CM

## 2019-09-11 HISTORY — PX: RIGHT/LEFT HEART CATH AND CORONARY ANGIOGRAPHY: CATH118266

## 2019-09-11 HISTORY — PX: CORONARY PRESSURE/FFR WITH 3D MAPPING: CATH118309

## 2019-09-11 LAB — POCT ACTIVATED CLOTTING TIME
Activated Clotting Time: 246 seconds
Activated Clotting Time: 158 seconds

## 2019-09-11 SURGERY — RIGHT AND LEFT HEART CATH
Anesthesia: Moderate Sedation

## 2019-09-11 SURGERY — RIGHT/LEFT HEART CATH AND CORONARY ANGIOGRAPHY
Anesthesia: Moderate Sedation

## 2019-09-11 MED ORDER — LABETALOL HCL 5 MG/ML IV SOLN: 10.0000 mg | INTRAVENOUS | Status: DC | PRN

## 2019-09-11 MED ORDER — SODIUM CHLORIDE 0.9% FLUSH: 3.0000 mL | INTRAVENOUS | Status: DC | PRN

## 2019-09-11 MED ORDER — FENTANYL CITRATE (PF) 100 MCG/2ML IJ SOLN
INTRAMUSCULAR | Status: DC | PRN
  Administered 2019-09-11 (×2): 50 ug via INTRAVENOUS

## 2019-09-11 MED ORDER — SODIUM CHLORIDE 0.9 % IV SOLN: INTRAVENOUS | Status: DC

## 2019-09-11 MED ORDER — SODIUM CHLORIDE 0.9% FLUSH: 3.0000 mL | Freq: Two times a day (BID) | INTRAVENOUS | Status: DC

## 2019-09-11 MED ORDER — IOHEXOL 300 MG/ML  SOLN
INTRAMUSCULAR | Status: DC | PRN
  Administered 2019-09-11: 15 mL via INTRA_ARTERIAL

## 2019-09-11 MED ORDER — SODIUM CHLORIDE 0.9 % WEIGHT BASED INFUSION: 3.0000 mL/kg/h | INTRAVENOUS | Status: AC

## 2019-09-11 MED ORDER — HEPARIN (PORCINE) IN NACL 1000-0.9 UT/500ML-% IV SOLN
INTRAVENOUS | Status: DC | PRN
  Administered 2019-09-11: 500 mL

## 2019-09-11 MED ORDER — HEPARIN SODIUM (PORCINE) 1000 UNIT/ML IJ SOLN
INTRAMUSCULAR | Status: AC
  Filled 2019-09-11: qty 1

## 2019-09-11 MED ORDER — SODIUM CHLORIDE 0.9 % IV SOLN: 250.0000 mL | INTRAVENOUS | Status: DC | PRN

## 2019-09-11 MED ORDER — FENTANYL CITRATE (PF) 100 MCG/2ML IJ SOLN
INTRAMUSCULAR | Status: AC
  Filled 2019-09-11: qty 2

## 2019-09-11 MED ORDER — ACETAMINOPHEN 325 MG PO TABS: 650.0000 mg | ORAL_TABLET | ORAL | Status: DC | PRN

## 2019-09-11 MED ORDER — NITROGLYCERIN 1 MG/10 ML FOR IR/CATH LAB
INTRA_ARTERIAL | Status: AC
  Filled 2019-09-11: qty 10

## 2019-09-11 MED ORDER — HEPARIN SODIUM (PORCINE) 1000 UNIT/ML IJ SOLN
INTRAMUSCULAR | Status: DC | PRN
  Administered 2019-09-11: 9000 [IU] via INTRAVENOUS

## 2019-09-11 MED ORDER — HEPARIN (PORCINE) IN NACL 1000-0.9 UT/500ML-% IV SOLN
INTRAVENOUS | Status: AC
  Filled 2019-09-11: qty 500

## 2019-09-11 MED ORDER — FENTANYL CITRATE (PF) 100 MCG/2ML IJ SOLN
INTRAMUSCULAR | Status: DC | PRN
  Administered 2019-09-11: 25 ug via INTRAVENOUS

## 2019-09-11 MED ORDER — MIDAZOLAM HCL 2 MG/2ML IJ SOLN
INTRAMUSCULAR | Status: AC
  Filled 2019-09-11: qty 2

## 2019-09-11 MED ORDER — ONDANSETRON HCL 4 MG/2ML IJ SOLN: 4.0000 mg | Freq: Four times a day (QID) | INTRAMUSCULAR | Status: DC | PRN

## 2019-09-11 MED ORDER — IOHEXOL 300 MG/ML  SOLN
INTRAMUSCULAR | Status: DC | PRN
  Administered 2019-09-11: 110 mL

## 2019-09-11 MED ORDER — HYDRALAZINE HCL 20 MG/ML IJ SOLN: 10.0000 mg | INTRAMUSCULAR | Status: DC | PRN

## 2019-09-11 MED ORDER — SODIUM CHLORIDE 0.9 % WEIGHT BASED INFUSION: 1.0000 mL/kg/h | INTRAVENOUS | Status: DC

## 2019-09-11 MED ORDER — NITROGLYCERIN 1 MG/10 ML FOR IR/CATH LAB
INTRA_ARTERIAL | Status: DC | PRN
  Administered 2019-09-11: 200 ug via INTRACORONARY

## 2019-09-11 MED ORDER — ASPIRIN 81 MG PO CHEW: 81.0000 mg | CHEWABLE_TABLET | Freq: Every day | ORAL | Status: DC

## 2019-09-11 MED ORDER — MIDAZOLAM HCL 2 MG/2ML IJ SOLN
INTRAMUSCULAR | Status: DC | PRN
  Administered 2019-09-11 (×2): 1 mg via INTRAVENOUS

## 2019-09-11 MED ORDER — ASPIRIN 81 MG PO CHEW: 81.0000 mg | CHEWABLE_TABLET | ORAL | Status: DC

## 2019-09-11 SURGICAL SUPPLY — 18 items
CATH INFINITI 5FR ANG PIGTAIL (CATHETERS) ×1 IMPLANT
CATH INFINITI 5FR JL4 (CATHETERS) ×1 IMPLANT
CATH INFINITI JR4 5F (CATHETERS) ×1 IMPLANT
CATH SWANZ 7F THERMO (CATHETERS) ×1 IMPLANT
CATH VISTA GUIDE 6FR JR4 (CATHETERS) ×1 IMPLANT
DEVICE CLOSURE MYNXGRIP 6/7F (Vascular Products) ×1 IMPLANT
DEVICE INFLAT 30 PLUS (MISCELLANEOUS) ×1 IMPLANT
KIT MANI 3VAL PERCEP (MISCELLANEOUS) ×3 IMPLANT
NDL PERC 18GX7CM (NEEDLE) IMPLANT
NDL SMART REG 18GX2-3/4 (NEEDLE) IMPLANT
NEEDLE PERC 18GX7CM (NEEDLE) ×3 IMPLANT
NEEDLE SMART REG 18GX2-3/4 (NEEDLE) ×3 IMPLANT
PACK CARDIAC CATH (CUSTOM PROCEDURE TRAY) ×3 IMPLANT
SHEATH AVANTI 5FR X 11CM (SHEATH) ×1 IMPLANT
SHEATH AVANTI 6FR X 11CM (SHEATH) ×1 IMPLANT
SHEATH AVANTI 7FRX11 (SHEATH) ×1 IMPLANT
WIRE GUIDERIGHT .035X150 (WIRE) ×1 IMPLANT
WIRE PRESSURE VERRATA (WIRE) ×1 IMPLANT

## 2019-09-11 NOTE — Discharge Instructions (Signed)

## 2019-09-11 NOTE — Brief Op Note (Signed)
09/11/2019  10:20 AM  PATIENT:  Kim Buchanan  68 y.o. female  PRE-OPERATIVE DIAGNOSIS:  Dilated cardiomyopathy, coronary disease with stable angina, shortness of breath, COPD, morbid obesity  POST-OPERATIVE DIAGNOSIS:  Dilated cardiomyopathy, coronary disease with stable angina, shortness of breath, COPD, morbid obesity  PROCEDURE:  Procedure(s): RIGHT/LEFT HEART CATH AND CORONARY ANGIOGRAPHY (Bilateral) Coronary Pressure Wire/FFR w/3D Mapping (N/A)  SURGEON:  Surgeon(s) and Role: Panel 1:    * Gollan, Kathlene November, MD - Primary Panel 2:    Nelva Bush, MD - Primary  PHYSICIAN ASSISTANT:   FINDINGS: 1. See dx cath report by Dr. Rockey Situ for report of details. 2. Moderate to severe proximal/mid RCA disease that is not significant by ifR (iFR 0.97).  RECOMMENDATIONS: 1. Medical therapy. 2. Remove right femoral vein sheath in 1 hour with manual compression. 3. Discharge home today if no post-catheterization complication.  Nelva Bush, MD [facility] Pager: [phone]

## 2019-09-15 ENCOUNTER — Other Ambulatory Visit: Payer: Self-pay

## 2019-09-15 ENCOUNTER — Ambulatory Visit: Payer: Medicare Other | Admitting: Pulmonary Disease

## 2019-09-15 ENCOUNTER — Encounter: Payer: Self-pay | Admitting: Pulmonary Disease

## 2019-09-15 ENCOUNTER — Other Ambulatory Visit
Admission: RE | Admit: 2019-09-15 | Discharge: 2019-09-15 | Disposition: A | Discharge status: Home / Self Care | Payer: Medicare Other | Source: Ambulatory Visit | Attending: Pulmonary Disease | Admitting: Pulmonary Disease

## 2019-09-15 VITALS — BP 128/74 | HR 89 | Temp 97.5°F | Ht 63.0 in | Wt 227.0 lb

## 2019-09-15 DIAGNOSIS — R635 Abnormal weight gain: Secondary | ICD-10-CM

## 2019-09-15 DIAGNOSIS — R0602 Shortness of breath: Secondary | ICD-10-CM

## 2019-09-15 DIAGNOSIS — R531 Weakness: Secondary | ICD-10-CM

## 2019-09-15 DIAGNOSIS — R5383 Other fatigue: Secondary | ICD-10-CM

## 2019-09-15 LAB — CK TOTAL AND CKMB (NOT AT ARMC)
CK, MB: 1.4 ng/mL (ref 0.5–5.0)
Relative Index: INVALID (ref 0.0–2.5)
Total CK: 54 U/L (ref 38–234)

## 2019-09-15 LAB — CORTISOL: Cortisol, Plasma: 11.8 ug/dL

## 2019-09-15 NOTE — Progress Notes (Signed)
    Assessment & Plan:  1. Fatigue, unspecified type (Primary)  2. Abnormal weight gain  3. Morbid obesity (HCC)  4. SOB (shortness of breath) - Pulmonary Function Test [facility] Only - CK Total (and CKMB); Future - ACTH ; Future - Cortisol; Future  5. Weakness   Patient Instructions  1.  We are going to check some blood work to evaluate for your weakness/fatigue.  2.  We will repeat the breathing test with measurements of your muscle strength  3.  We will see him in follow-up after tests are done.  Please note: late entry documentation due to logistical difficulties during COVID-19 pandemic. This note is filed for information purposes only, and is not intended to be used for billing, nor does it represent the full scope/nature of the visit in question. Please see any associated scanned media linked to date of encounter for additional pertinent information.  Subjective:    HPI: Kim Buchanan is a 68 y.o. female presenting to the pulmonology clinic on 09/15/2019 with report of: Follow-up (Echo- 09/04/2019- pt reports of sob with exertion and mild non prod cough. )   Patient is a 68 year old former smoker (quit 11/2018) who presents for follow-up on the issue of dyspnea which she describes more as fatigue and weakness.  We first evaluated the patient on 23 October assuming care after Dr. Jamel departure.  Outpatient Encounter Medications as of 09/15/2019  Medication Sig   [DISCONTINUED] aspirin  81 MG tablet Take 81 mg by mouth daily.  (Patient not taking: Reported on 09/04/2023)   [DISCONTINUED] atorvastatin  (LIPITOR) 20 MG tablet TAKE 1 TABLET BY MOUTH AT  BEDTIME   [DISCONTINUED] isosorbide  mononitrate (IMDUR ) 30 MG 24 hr tablet TAKE 1 TABLET BY MOUTH  DAILY   [DISCONTINUED] pantoprazole  (PROTONIX ) 40 MG tablet Take 40 mg by mouth daily.    [DISCONTINUED] traZODone  (DESYREL ) 100 MG tablet TAKE 1 TABLET BY MOUTH AT  BEDTIME AS NEEDED. FOR  SLEEP (Patient taking  differently: Take 100 mg by mouth at bedtime. )   [DISCONTINUED] hydrochlorothiazide (HYDRODIURIL) 12.5 MG tablet Take 12.5 mg by mouth daily.    [DISCONTINUED] losartan  (COZAAR ) 50 MG tablet Take 50 mg by mouth daily.    [DISCONTINUED] montelukast  (SINGULAIR ) 10 MG tablet Take 1 tablet (10 mg total) by mouth at bedtime.   No facility-administered encounter medications on file as of 09/15/2019.      Objective:   Vitals:   09/15/19 1100  BP: 128/74  Pulse: 89  Temp: (!) 97.5 F (36.4 C)  Height: 5' 3 (1.6 m)  Weight: 227 lb (103 kg)  SpO2: 100%  TempSrc: Temporal  BMI (Calculated): 40.22     Physical exam documentation is limited by delayed entry of information.

## 2019-09-15 NOTE — H&P (Signed)
H&P Addendum, precardiac catheterization  Patient was seen and evaluated prior to Cardiac catheterization procedure Symptoms, prior testing details again confirmed with the patient Patient examined, no significant change from prior exam Lab work reviewed in detail personally by myself Patient understands risk and benefit of the procedure, willing to proceed  Signed, Tim Gollan, MD, Ph.D [facility]    

## 2019-09-15 NOTE — Patient Instructions (Signed)
1.  We are going to check some blood work to evaluate for your weakness/fatigue.  2.  We will repeat the breathing test with measurements of your muscle strength  3.  We will see him in follow-up after tests are done.

## 2019-09-16 LAB — ACTH: C206 ACTH: 29.6 pg/mL (ref 7.2–63.3)

## 2019-09-25 ENCOUNTER — Encounter: Payer: Self-pay | Admitting: Nurse Practitioner

## 2019-09-25 ENCOUNTER — Ambulatory Visit (INDEPENDENT_AMBULATORY_CARE_PROVIDER_SITE_OTHER): Payer: Medicare Other | Admitting: Nurse Practitioner

## 2019-09-25 ENCOUNTER — Other Ambulatory Visit: Payer: Self-pay

## 2019-09-25 VITALS — BP 130/70 | HR 79 | Ht 63.0 in | Wt 227.0 lb

## 2019-09-25 DIAGNOSIS — G4733 Obstructive sleep apnea (adult) (pediatric): Secondary | ICD-10-CM

## 2019-09-25 DIAGNOSIS — I428 Other cardiomyopathies: Secondary | ICD-10-CM

## 2019-09-25 DIAGNOSIS — E785 Hyperlipidemia, unspecified: Secondary | ICD-10-CM | POA: Diagnosis not present

## 2019-09-25 DIAGNOSIS — I2510 Atherosclerotic heart disease of native coronary artery without angina pectoris: Secondary | ICD-10-CM | POA: Diagnosis not present

## 2019-09-25 MED ORDER — CARVEDILOL 3.125 MG PO TABS: 3.1250 mg | ORAL_TABLET | Freq: Two times a day (BID) | ORAL | 3 refills | Status: DC

## 2019-09-25 NOTE — Progress Notes (Signed)
Office Visit    Patient Name: Kim Buchanan Date of Encounter: 09/25/2019  Primary Care Provider:  Virginia Crews, MD Primary Cardiologist:  Kim Rogue, MD  Chief Complaint    68 year old female with a history of tobacco abuse (quit February 2020), dyspnea, obesity, hyperlipidemia, untreated sleep apnea, COPD, GERD, who presents for follow-up after recent finding of nonischemic cardiomyopathy and nonobstructive CAD.  Past Medical History    Past Medical History:  Diagnosis Date   Anxiety    Arthritis    lower back   COPD (chronic obstructive pulmonary disease) (HCC)    Depression    GERD (gastroesophageal reflux disease)    Hyperlipidemia    Morbid obesity (HCC)    NICM (nonischemic cardiomyopathy) ([place])    a. 03/2019 Echo: Nl LVEF; b. 09/04/2019 Echo: EF 25-30%, Gr1 DD, glob HK, nl RV fxn; c. 09/11/2019 Cath: nonobs LAD/RCA dzs. EF 50-55%. RA 10, RV 29/8(11), PA 27/16(20), PCWP 12. CO/CI 5.04/2.51.   Nonobstructive CAD (coronary artery disease)    a. 03/2019 MV Kim Buchanan): EF 63%, ant ischemia; b. 08/2019 Cath: LM nl, LAD 4m, RCA 70p (iFR nl @ 0.97), 50p. EF 50-55%-->Med Rx.   Osteoporosis    Panic attack    PTSD (post-traumatic stress disorder)    Rectal fistula    Sleep apnea    a. Prev wore CPAP but lost insurance and hasn't been using since (now has insurance).   Wears dentures    full upper   Past Surgical History:  Procedure Laterality Date   COLONOSCOPY  2014   COLONOSCOPY WITH PROPOFOL N/A 10/01/2016   Procedure: COLONOSCOPY WITH PROPOFOL;  Surgeon: Kim Lame, MD;  Location: [place];  Service: Endoscopy;  Laterality: N/A;   CORONARY PRESSURE WIRE/FFR WITH 3D MAPPING N/A 09/11/2019   Procedure: Coronary Pressure Wire/FFR w/3D Mapping;  Surgeon: Kim Bush, MD;  Location: [place] [facility];  Service: Cardiovascular;  Laterality: N/A;   CYSTOCELE REPAIR N/A 12/17/2016   Procedure: ANTERIOR REPAIR (CYSTOCELE);   Surgeon: Kim Mars, MD;  Location: [facility];  Service: Gynecology;  Laterality: N/A;   ESOPHAGOGASTRODUODENOSCOPY (EGD) WITH PROPOFOL N/A 08/11/2019   Procedure: ESOPHAGOGASTRODUODENOSCOPY (EGD) WITH PROPOFOL;  Surgeon: Kim Lame, MD;  Location: [facility];  Service: Endoscopy;  Laterality: N/A;   POLYPECTOMY  10/01/2016   Procedure: POLYPECTOMY;  Surgeon: Kim Lame, MD;  Location: [place];  Service: Endoscopy;;   RECTAL SURGERY  06/28/2015   Rectal prolapse, laparoscopic rectopexy the coldCharmont, MD; [facility]   RIGHT/LEFT HEART CATH AND CORONARY ANGIOGRAPHY Bilateral 09/11/2019   Procedure: RIGHT/LEFT HEART CATH AND CORONARY ANGIOGRAPHY;  Surgeon: Kim Merritts, MD;  Location: [place] [facility];  Service: Cardiovascular;  Laterality: Bilateral;   TUBAL LIGATION     VAGINAL HYSTERECTOMY Bilateral 12/17/2016   Procedure: HYSTERECTOMY VAGINAL WITH BILATERAL SALPINGO OOPHERECTOMY;  Surgeon: Kim Mars, MD;  Location: [facility];  Service: Gynecology;  Laterality: Bilateral;    Allergies  Allergies  Allergen Reactions   Lisinopril Cough    History of Present Illness    68 year old female with the above complex past medical history including tobacco abuse (quit February 2020), COPD with chronic dyspnea, obesity, hyperlipidemia, untreated sleep apnea, GERD, and depression.  She was previously followed by Dr. Humphrey Buchanan and underwent echocardiogram in June of this year which showed normal LV function.  Stress testing was also undertaken in the setting of ongoing dyspnea and this showed anterior ischemia.  There was a recommendation for cardiac CT  angiography.  Patient did not follow-up and was subsequently evaluated by pulmonology.  She was later referred to Dr. Rockey Buchanan, who saw her on October 28, with plan for follow-up echo and after review of previously performed stress testing, additional ischemic evaluation (records were not available at the time of the  patient's appointment but became available later).  Echocardiogram was performed on November 13 and revealed an EF of 25 to 30% with grade 1 diastolic dysfunction and global hypokinesis.  No significant valvular disease.  In the setting of this finding of new cardiomyopathy, she was contacted and set up for diagnostic catheterization, which took place on November 20, revealing a 70% stenosis in the proximal RCA.  Instantaneous wave free ratio was performed and was normal at 0.97 and therefore, medical therapy was recommended.  Of note, left ventriculography was performed and showed an EF of 50 to 55%.  Right heart catheterization showed normal filling pressures.  Since her catheterization, Kim Buchanan has felt well.  She notes that she was previously on losartan and this was discontinued recently with complete resolution of prior persistent cough.  She continues to have dyspnea with minimal activity though notes that she is very sedentary.  She denies chest pain, palpitations, PND, orthopnea, dizziness, syncope, edema, or early satiety.  Home Medications    Prior to Admission medications   Medication Sig Start Date End Date Taking? Authorizing Provider  aspirin 81 MG tablet Take 81 mg by mouth daily.    Yes [provider]  atorvastatin (LIPITOR) 20 MG tablet TAKE 1 TABLET BY MOUTH AT  BEDTIME 08/10/19  Yes Buchanan, Kim Bucy, MD  isosorbide mononitrate (IMDUR) 30 MG 24 hr tablet TAKE 1 TABLET BY MOUTH  DAILY 08/10/19  Yes Buchanan, Kim Bucy, MD  pantoprazole (PROTONIX) 40 MG tablet Take 40 mg by mouth daily.  05/21/19  Yes [provider]  traZODone (DESYREL) 100 MG tablet TAKE 1 TABLET BY MOUTH AT  BEDTIME AS NEEDED. FOR  SLEEP Patient taking differently: Take 100 mg by mouth at bedtime.  08/10/19  Yes Buchanan, Kim Bucy, MD    Review of Systems    Chronic dyspnea on exertion which has not changed.  She denies chest pain, palpitations, PND, orthopnea, dizziness, syncope,  edema, or early satiety.  All other systems reviewed and are otherwise negative except as noted above.  Physical Exam    VS:  BP 130/70 (BP Location: Left Arm, Patient Position: Sitting, Cuff Size: Normal)    Pulse 79    Ht 5\' 3"  (1.6 m)    Wt 227 lb (103 kg)    BMI 40.21 kg/m  , BMI Body mass index is 40.21 kg/m. GEN: Well nourished, well developed, in no acute distress. HEENT: normal. Neck: Supple, no JVD, carotid bruits, or masses. Cardiac: RRR, no murmurs, rubs, or gallops. No clubbing, cyanosis, edema.  Radials/PT 2+ and equal bilaterally.  Respiratory:  Respirations regular and unlabored, clear to auscultation bilaterally. GI: Soft, nontender, nondistended, BS + x 4. Kim: no deformity or atrophy. Skin: warm and dry, no rash. Neuro:  Strength and sensation are intact. Psych: Normal affect.  Accessory Clinical Findings    ECG personally reviewed by me today -regular sinus rhythm, 79, prior anterior infarct, incomplete left bundle- no acute changes.  Lab Results  Component Value Date   WBC 7.6 09/08/2019   HGB 11.7 (L) 09/08/2019   HCT 36.9 09/08/2019   MCV 87.4 09/08/2019   PLT 258 09/08/2019   Lab Results  Component Value Date   CREATININE 0.85 09/08/2019   BUN 15 09/08/2019   NA 140 09/08/2019   K 3.8 09/08/2019   CL 110 09/08/2019   CO2 21 (L) 09/08/2019   Lab Results  Component Value Date   ALT 42 08/14/2019   AST 34 08/14/2019   ALKPHOS 72 08/14/2019   BILITOT 0.7 08/14/2019   Lab Results  Component Value Date   CHOL 80 (L) 10/03/2018   HDL 44 10/03/2018   LDLCALC 15 10/03/2018   TRIG 106 10/03/2018   CHOLHDL 1.8 10/03/2018    Lab Results  Component Value Date   HGBA1C 5.4 01/01/2018    Assessment & Plan    1.  Nonischemic cardiomyopathy: Patient with a history of chronic dyspnea in the setting of prior tobacco abuse.  Recent echocardiogram showed an EF of 25 to 30% with global hypokinesis.  This was followed by diagnostic catheterization which  revealed moderate nonobstructive proximal RCA disease with normal iFR and recommendation for medical therapy.  Interestingly, left ventriculography at the time of catheterization showed normal LV function while right heart pressures were normal.  She continues to have dyspnea on exertion.  She is euvolemic on examination today.  She does have a scale at home but has not been weighing herself daily.  We discussed the importance of daily weights, sodium restriction, medication compliance, and symptom reporting and she verbalizes understanding.  Discrepancy between echocardiography and ventriculography is unclear and I did discuss this with Dr. Rockey Buchanan today.  I discussed addition of ARB with patient however, she notes that she had previously been on losartan and that this was likely making her cough as her cough has resolved since stopping it.  In that setting, we agreed to initiate a low-dose of carvedilol.  She is not currently on any diuretics and as noted above, right heart pressures were normal.  At this point, I think the biggest concern is deconditioning and have offered to refer her to cardiopulmonary rehab.  She and her husband thought this was a good idea and referral has been placed.  2.  Nonobstructive coronary artery disease: Moderate RCA disease with normal iFR on catheterization in November.  She denies chest pain.  We do not feel that her coronary disease is causing her dyspnea.  Continue medical therapy with aspirin, statin, and nitrate.  Adding low-dose beta-blocker in the setting of LV dysfunction on echocardiogram.    3.  Hyperlipidemia: She remains on statin therapy with an LDL of 15 last December.  LFTs were normal in October.  4.  COPD/tobacco abuse: She quit smoking in February.  No active wheezing today and she does not require inhalers.   5.  History of sleep apnea: Patient says she was previously diagnosed with sleep apnea and was on CPAP but then lost her insurance and therefore lost  her machine.  She will follow up with Dr. Patsey Berthold for further evaluation as she now has insurance.  6.  Morbid obesity/deconditioning: Referring to cardiopulmonary rehab.    7.  Disposition: Referral to cardiopulmonary rehab placed.  Follow-up in clinic in 4 to 6 weeks or sooner if necessary.  Murray Hodgkins, NP 09/25/2019, 12:44 PM

## 2019-09-25 NOTE — Patient Instructions (Addendum)
Medication Instructions:  Your physician has recommended you make the following change in your medication:  1. START Carvediolol 3.12 mg twice a day.  *If you need a refill on your cardiac medications bfore your next appointment, please call your pharmacy   Lab Work: None  If you have labs (blood work) drawn today and your tests are completely normal, you will receive your results only by: Marland Kitchen MyChart Message (if you have MyChart) OR . A paper copy in the mail If you have any lab test that is abnormal or we need to change your treatment, we will call you to review the results.  Testing/Procedures: None  Follow-Up: At [facility], you and your health needs are our priority.  As part of our continuing mission to provide you with exceptional heart care, we have created designated Provider Care Teams.  These Care Teams include your primary Cardiologist (physician) and Advanced Practice Providers (APPs -  Physician Assistants and Nurse Practitioners) who all work together to provide you with the care you need, when you need it.  Your next appointment:   In  4-6 weeks    The format for your next appointment:   In Person  Provider:   You may see Ida Rogue, MD or one of the following Advanced Practice Providers on your designated Care Team:    Murray Hodgkins, NP  Christell Faith, PA-C  Marrianne Mood, PA-C   Other Instructions Referral placed for cardio/pulmonary Rehab.

## 2019-09-28 ENCOUNTER — Other Ambulatory Visit: Payer: Self-pay | Admitting: *Deleted

## 2019-09-28 DIAGNOSIS — R0609 Other forms of dyspnea: Secondary | ICD-10-CM

## 2019-10-06 NOTE — Progress Notes (Deleted)
Subjective:   Kim Buchanan is a 68 y.o. female who presents for Medicare Annual (Subsequent) preventive examination.  Review of Systems:  N/A        Objective:     Vitals: There were no vitals taken for this visit.  There is no height or weight on file to calculate BMI.  Advanced Directives 09/11/2019 08/11/2019 10/03/2018 01/04/2018 11/14/2017 11/13/2017 08/15/2017  Does Patient Have a Medical Advance Directive? Yes Yes Yes No Yes No Yes  Type of Advance Directive - [place];Living will [place];Living will - Living will - Living will  Does patient want to make changes to medical advance directive? No - Patient declined - - - No - Patient declined - -  Copy of [place] in Chart? - No - copy requested No - copy requested - - - -  Would patient like information on creating a medical advance directive? - - - No - Patient declined - No - Patient declined -    Tobacco Social History   Tobacco Use  Smoking Status Former Smoker  . Packs/day: 0.50  . Years: 47.00  . Pack years: 23.50  . Types: Cigarettes  . Quit date: 11/29/2018  . Years since quitting: 0.8  Smokeless Tobacco Never Used  Tobacco Comment   started at age 21     Counseling given: Not Answered Comment: started at age 7   Clinical Intake:                       Past Medical History:  Diagnosis Date  . Anxiety   . Arthritis    lower back  . COPD (chronic obstructive pulmonary disease) ([place])   . Depression   . GERD (gastroesophageal reflux disease)   . Hyperlipidemia   . Morbid obesity ([place])   . NICM (nonischemic cardiomyopathy) ([place])    a. 03/2019 Echo: Nl LVEF; b. 09/04/2019 Echo: EF 25-30%, Gr1 DD, glob HK, nl RV fxn; c. 09/11/2019 Cath: nonobs LAD/RCA dzs. EF 50-55%. RA 10, RV 29/8(11), PA 27/16(20), PCWP 12. CO/CI 5.04/2.51.  Marland Kitchen Nonobstructive CAD (coronary artery disease)    a. 03/2019 [facility]): EF 63%, ant ischemia; b. 08/2019  Cath: LM nl, LAD 54m, RCA 70p (iFR nl @ 0.97), 50p. EF 50-55%-->Med Rx.  . Osteoporosis   . Panic attack   . PTSD (post-traumatic stress disorder)   . Rectal fistula   . Sleep apnea    a. Prev wore CPAP but lost insurance and hasn't been using since (now has insurance).  . Wears dentures    full upper   Past Surgical History:  Procedure Laterality Date  . COLONOSCOPY  2014  . COLONOSCOPY WITH PROPOFOL N/A 10/01/2016   Procedure: COLONOSCOPY WITH PROPOFOL;  Surgeon: Lucilla Lame, MD;  Location: [facility];  Service: Endoscopy;  Laterality: N/A;  . CORONARY PRESSURE WIRE/FFR WITH 3D MAPPING N/A 09/11/2019   Procedure: Coronary Pressure Wire/FFR w/3D Mapping;  Surgeon: Nelva Bush, MD;  Location: [facility];  Service: Cardiovascular;  Laterality: N/A;  . CYSTOCELE REPAIR N/A 12/17/2016   Procedure: ANTERIOR REPAIR (CYSTOCELE);  Surgeon: Brayton Mars, MD;  Location: [facility];  Service: Gynecology;  Laterality: N/A;  . ESOPHAGOGASTRODUODENOSCOPY (EGD) WITH PROPOFOL N/A 08/11/2019   Procedure: ESOPHAGOGASTRODUODENOSCOPY (EGD) WITH PROPOFOL;  Surgeon: Lucilla Lame, MD;  Location: [facility];  Service: Endoscopy;  Laterality: N/A;  . POLYPECTOMY  10/01/2016   Procedure: POLYPECTOMY;  Surgeon: Evangeline Gula  Allen Norris, MD;  Location: [facility];  Service: Endoscopy;;  . RECTAL SURGERY  06/28/2015   Rectal prolapse, laparoscopic rectopexy the coldCharm[place], MD; [facility]  . RIGHT/LEFT HEART CATH AND CORONARY ANGIOGRAPHY Bilateral 09/11/2019   Procedure: RIGHT/LEFT HEART CATH AND CORONARY ANGIOGRAPHY;  Surgeon: [name], MD;  Location: [facility];  Service: Cardiovascular;  Laterality: Bilateral;  . TUBAL LIGATION    . VAGINAL HYSTERECTOMY Bilateral 12/17/2016   Procedure: HYSTERECTOMY VAGINAL WITH BILATERAL SALPINGO OOPHERECTOMY;  Surgeon: Brayton Mars, MD;  Location: [facility];  Service: Gynecology;  Laterality: Bilateral;   Family History  Problem  Relation Age of Onset  . Diabetes Mother   . Diabetes Sister   . Diabetes Brother   . Lymphoma Brother   . Heart disease Father   . Hypertension Father   . Diabetes Sister   . Diabetes Brother   . Lymphoma Brother   . Heart disease Brother   . Healthy Sister   . Breast cancer Neg Hx   . Ovarian cancer Neg Hx   . Colon cancer Neg Hx    Social History   Socioeconomic History  . Marital status: Married    Spouse name: Paediatric nurse  . Number of children: 0  . Years of education: Not on file  . Highest education level: 9th grade  Occupational History    Employer: DISABLED    Comment: for panic attacks  Tobacco Use  . Smoking status: Former Smoker    Packs/day: 0.50    Years: 47.00    Pack years: 23.50    Types: Cigarettes    Quit date: 11/29/2018    Years since quitting: 0.8  . Smokeless tobacco: Never Used  . Tobacco comment: started at age 71  Substance and Sexual Activity  . Alcohol use: No    Alcohol/week: 0.0 standard drinks  . Drug use: No  . Sexual activity: Not Currently  Other Topics Concern  . Not on file  Social History Narrative   Pt had one son, who died when was 41 YO in a MVA.   Social Determinants of Health   Financial Resource Strain:   . Difficulty of Paying Living Expenses: Not on file  Food Insecurity:   . Worried About Charity fundraiser in the Last Year: Not on file  . Ran Out of Food in the Last Year: Not on file  Transportation Needs:   . Lack of Transportation (Medical): Not on file  . Lack of Transportation (Non-Medical): Not on file  Physical Activity:   . Days of Exercise per Week: Not on file  . Minutes of Exercise per Session: Not on file  Stress:   . Feeling of Stress : Not on file  Social Connections:   . Frequency of Communication with Friends and Family: Not on file  . Frequency of Social Gatherings with Friends and Family: Not on file  . Attends Religious Services: Not on file  . Active Member of Clubs or Organizations: Not on  file  . Attends Archivist Meetings: Not on file  . Marital Status: Not on file    Outpatient Encounter Medications as of 10/07/2019  Medication Sig  . aspirin 81 MG tablet Take 81 mg by mouth daily.   Marland Kitchen atorvastatin (LIPITOR) 20 MG tablet TAKE 1 TABLET BY MOUTH AT  BEDTIME  . carvedilol (COREG) 3.125 MG tablet Take 1 tablet (3.125 mg total) by mouth 2 (two) times daily.  . isosorbide mononitrate (IMDUR) 30  MG 24 hr tablet TAKE 1 TABLET BY MOUTH  DAILY  . pantoprazole (PROTONIX) 40 MG tablet Take 40 mg by mouth daily.   . traZODone (DESYREL) 100 MG tablet TAKE 1 TABLET BY MOUTH AT  BEDTIME AS NEEDED. FOR  SLEEP (Patient taking differently: Take 100 mg by mouth at bedtime. )   No facility-administered encounter medications on file as of 10/07/2019.    Activities of Daily Living No flowsheet data found.  Patient Care Team: Virginia Crews, MD as PCP - General (Family Medicine) [name], MD as PCP - Cardiology (Cardiology) [name], OD as Consulting Physician (Optometry) Dionisio David, MD as Consulting Physician (Cardiology) Laverle Hobby, MD as Consulting Physician (Pulmonary Disease)    Assessment:   This is a routine wellness examination for Shikara.  Exercise Activities and Dietary recommendations    Goals    . Quit Smoking     Recommend to continue efforts to reduce smoking habits until no longer smoking.      . Weight (lb) < 150 lb (68 kg)     Recommend cutting out bad carbohydrates in daily diet. Pt to focus on healthy carbs and spreading out servings in diet.        Fall Risk: Fall Risk  06/05/2019 10/03/2018 03/13/2018 09/03/2017 08/15/2017  Falls in the past year? 0 0 Yes Yes Yes  Number falls in past yr: - - 1 1 1   Injury with Fall? - - Yes No No  Comment - - - - -  Risk for fall due to : - - - - -  Follow up - - Falls evaluation completed - Falls evaluation completed    FALL RISK PREVENTION PERTAINING TO THE  HOME:  Any stairs in or around the home? {YES/NO:21197} If so, are there any without handrails? {YES/NO:21197}  Home free of loose throw rugs in walkways, pet beds, electrical cords, etc? Yes  Adequate lighting in your home to reduce risk of falls? Yes   ASSISTIVE DEVICES UTILIZED TO PREVENT FALLS:  Life alert? {YES/NO:21197} Use of a cane, walker or w/c? {YES/NO:21197} Grab bars in the bathroom? {YES/NO:21197} Shower chair or bench in shower? {YES/NO:21197} Elevated toilet seat or a handicapped toilet? {YES/NO:21197}   TIMED UP AND GO:  Was the test performed? No .    Depression Screen PHQ 2/9 Scores 08/06/2019 06/05/2019 03/04/2019 10/03/2018  PHQ - 2 Score 6 6 0 1  PHQ- 9 Score 18 20 7  -     Cognitive Function     6CIT Screen 10/03/2018 05/13/2017  What Year? 0 points 0 points  What month? 0 points 0 points  What time? 0 points 0 points  Count back from 20 0 points 0 points  Months in reverse 0 points 0 points  Repeat phrase 0 points 2 points  Total Score 0 2    Immunization History  Administered Date(s) Administered  . Influenza, High Dose Seasonal PF 06/16/2019  . Influenza, Seasonal, Injecte, Preservative Fre 08/18/2008, 07/14/2014  . Influenza,inj,Quad PF,6+ Mos 09/25/2013, 08/05/2015, 08/02/2016, 07/24/2017  . Pneumococcal Conjugate-13 03/13/2018  . Pneumococcal Polysaccharide-23 08/06/2019  . Tdap 08/08/2009    Qualifies for Shingles Vaccine? Yes . Due for Shingrix. Education has been provided regarding the importance of this vaccine. Pt has been advised to call insurance company to determine out of pocket expense. Advised may also receive vaccine at local pharmacy or Health Dept. Verbalized acceptance and understanding.  Tdap: Although this vaccine is not a covered service during  a Wellness Exam, does the patient still wish to receive this vaccine today?  No .  Education has been provided regarding the importance of this vaccine. Advised may receive this  vaccine at local pharmacy or Health Dept. Aware to provide a copy of the vaccination record if obtained from local pharmacy or Health Dept. Verbalized acceptance and understanding.  Flu Vaccine: Up to date  Pneumococcal Vaccine: Completed series   Screening Tests Health Maintenance  Topic Date Due  . MAMMOGRAM  05/21/2016  . DEXA SCAN  02/10/2018  . TETANUS/TDAP  08/09/2019  . COLONOSCOPY  10/01/2021  . INFLUENZA VACCINE  Completed  . Hepatitis C Screening  Completed  . PNA vac Low Risk Adult  Completed    Cancer Screenings:  Colorectal Screening: Completed 10/01/16. Repeat every 5 years.  Mammogram: Completed 07/02/14.Ordered ***. Pt provided with contact info and advised to call to schedule appt. Pt aware the office will call re: appt.  Bone Density: Completed 02/25/07. Results reflect NORMAL, OSTEOPENIA, OSTEOPOROSIS. Repeat every 2 years. Ordered ***. Pt provided with contact info and advised to call to schedule appt. Pt aware the office will call re: appt.  Lung Cancer Screening: (Low Dose CT Chest recommended if Age 32-80 years, 30 pack-year currently smoking OR have quit w/in 15years.) {DOES NOT does:27190::"does not"} qualify.   Additional Screening:  Hepatitis C Screening: Up to date  Vision Screening: Recommended annual ophthalmology exams for early detection of glaucoma and other disorders of the eye.  Dental Screening: Recommended annual dental exams for proper oral hygiene  Community Resource Referral:  CRR required this visit?  No       Plan:  I have personally reviewed and addressed the Medicare Annual Wellness questionnaire and have noted the following in the patient's chart:  A. Medical and social history B. Use of alcohol, tobacco or illicit drugs  C. Current medications and supplements D. Functional ability and status E.  Nutritional status F.  Physical activity G. Advance directives H. List of other physicians I.  Hospitalizations, surgeries, and  ER visits in previous 12 months J.  [place] such as hearing and vision if needed, cognitive and depression L. Referrals and appointments   In addition, I have reviewed and discussed with patient certain preventive protocols, quality metrics, and best practice recommendations. A written personalized care plan for preventive services as well as general preventive health recommendations were provided to patient. Nurse Health Advisor  Signed,    Akhil Piscopo [place], [place]  [id number] Nurse Health Advisor   Nurse Notes: ***

## 2019-10-07 ENCOUNTER — Ambulatory Visit: Payer: Self-pay

## 2019-10-07 ENCOUNTER — Encounter: Payer: Self-pay | Admitting: Family Medicine

## 2019-11-03 ENCOUNTER — Ambulatory Visit: Payer: Medicare Other | Admitting: Cardiovascular Disease

## 2019-11-03 ENCOUNTER — Encounter: Payer: Medicare Other | Admitting: Family Medicine

## 2019-11-04 ENCOUNTER — Encounter: Payer: Medicare Other | Admitting: Family Medicine

## 2019-11-04 ENCOUNTER — Encounter: Payer: Self-pay | Admitting: Cardiovascular Disease

## 2019-11-06 ENCOUNTER — Ambulatory Visit: Payer: Medicare Other | Admitting: Family Medicine

## 2019-11-19 ENCOUNTER — Ambulatory Visit: Payer: Medicare Other

## 2019-11-26 NOTE — Progress Notes (Signed)
Subjective:   Kim Buchanan is a 69 y.o. female who presents for Medicare Annual (Subsequent) preventive examination.  Review of Systems:  N/A  Cardiac Risk Factors include: advanced age (>32men, >74 women);hypertension;obesity (BMI >30kg/m2)     Objective:     Vitals: BP 116/60 (BP Location: Right Arm)   Pulse 83   Temp 98.1 F (36.7 C) (Oral)   Ht 5\' 3"  (1.6 m)   Wt 233 lb 12.8 oz (106.1 kg)   BMI 41.42 kg/m   Body mass index is 41.42 kg/m.  Advanced Directives 11/30/2019 09/11/2019 08/11/2019 10/03/2018 01/04/2018 11/14/2017 11/13/2017  Does Patient Have a Medical Advance Directive? Yes Yes Yes Yes No Yes No  Type of Paramedic of [place];Living will - [place];Living will [place];Living will - Living will -  Does patient want to make changes to medical advance directive? - No - Patient declined - - - No - Patient declined -  Copy of [place] in Chart? No - copy requested - No - copy requested No - copy requested - - -  Would patient like information on creating a medical advance directive? - - - - No - Patient declined - No - Patient declined    Tobacco Social History   Tobacco Use  Smoking Status Former Smoker  . Packs/day: 0.50  . Years: 47.00  . Pack years: 23.50  . Types: Cigarettes  . Quit date: 11/29/2018  . Years since quitting: 1.0  Smokeless Tobacco Never Used  Tobacco Comment   started at age 23     Counseling given: Not Answered Comment: started at age 76   Clinical Intake:  Pre-visit preparation completed: Yes  Pain : No/denies pain Pain Score: 0-No pain     Nutritional Status: BMI > 30  Obese Nutritional Risks: None Diabetes: No  How often do you need to have someone help you when you read instructions, pamphlets, or other written materials from your doctor or pharmacy?: 1 - Never  Interpreter Needed?: No  Information entered by :: MMarkoski,  LPN  Past Medical History:  Diagnosis Date  . Anxiety   . Arthritis    lower back  . COPD (chronic obstructive pulmonary disease) ([place])   . Depression   . GERD (gastroesophageal reflux disease)   . Hyperlipidemia   . Morbid obesity ([place])   . NICM (nonischemic cardiomyopathy) ([place])    a. 03/2019 Echo: Nl LVEF; b. 09/04/2019 Echo: EF 25-30%, Gr1 DD, glob HK, nl RV fxn; c. 09/11/2019 Cath: nonobs LAD/RCA dzs. EF 50-55%. RA 10, RV 29/8(11), PA 27/16(20), PCWP 12. CO/CI 5.04/2.51.  Marland Kitchen Nonobstructive CAD (coronary artery disease)    a. 03/2019 [facility]): EF 63%, ant ischemia; b. 08/2019 Cath: LM nl, LAD 8m, RCA 70p (iFR nl @ 0.97), 50p. EF 50-55%-->Med Rx.  . Osteoporosis   . Panic attack   . PTSD (post-traumatic stress disorder)   . Rectal fistula   . Sleep apnea    a. Prev wore CPAP but lost insurance and hasn't been using since (now has insurance).  . Wears dentures    full upper   Past Surgical History:  Procedure Laterality Date  . COLONOSCOPY  2014  . COLONOSCOPY WITH PROPOFOL N/A 10/01/2016   Procedure: COLONOSCOPY WITH PROPOFOL;  Surgeon: Lucilla Lame, MD;  Location: [facility];  Service: Endoscopy;  Laterality: N/A;  . CORONARY PRESSURE WIRE/FFR WITH 3D MAPPING N/A 09/11/2019   Procedure: Coronary Pressure  Wire/FFR w/3D Mapping;  Surgeon: Nelva Bush, MD;  Location: [facility];  Service: Cardiovascular;  Laterality: N/A;  . CYSTOCELE REPAIR N/A 12/17/2016   Procedure: ANTERIOR REPAIR (CYSTOCELE);  Surgeon: Brayton Mars, MD;  Location: [facility];  Service: Gynecology;  Laterality: N/A;  . ESOPHAGOGASTRODUODENOSCOPY (EGD) WITH PROPOFOL N/A 08/11/2019   Procedure: ESOPHAGOGASTRODUODENOSCOPY (EGD) WITH PROPOFOL;  Surgeon: Lucilla Lame, MD;  Location: [facility];  Service: Endoscopy;  Laterality: N/A;  . POLYPECTOMY  10/01/2016   Procedure: POLYPECTOMY;  Surgeon: Lucilla Lame, MD;  Location: [facility];  Service: Endoscopy;;  . RECTAL SURGERY   06/28/2015   Rectal prolapse, laparoscopic rectopexy the coldCharmont, MD; [facility]  . RIGHT/LEFT HEART CATH AND CORONARY ANGIOGRAPHY Bilateral 09/11/2019   Procedure: RIGHT/LEFT HEART CATH AND CORONARY ANGIOGRAPHY;  Surgeon: [name], MD;  Location: [facility];  Service: Cardiovascular;  Laterality: Bilateral;  . TUBAL LIGATION    . VAGINAL HYSTERECTOMY Bilateral 12/17/2016   Procedure: HYSTERECTOMY VAGINAL WITH BILATERAL SALPINGO OOPHERECTOMY;  Surgeon: Brayton Mars, MD;  Location: [facility];  Service: Gynecology;  Laterality: Bilateral;   Family History  Problem Relation Age of Onset  . Diabetes Mother   . Diabetes Sister   . Cancer Sister   . Diabetes Brother   . Lymphoma Brother   . Heart disease Father   . Hypertension Father   . Diabetes Sister   . Diabetes Brother   . Lymphoma Brother   . Heart disease Brother   . Healthy Sister   . Breast cancer Neg Hx   . Ovarian cancer Neg Hx   . Colon cancer Neg Hx    Social History   Socioeconomic History  . Marital status: Married    Spouse name: Paediatric nurse  . Number of children: 1  . Years of education: Not on file  . Highest education level: 9th grade  Occupational History    Employer: DISABLED    Comment: for panic attacks  Tobacco Use  . Smoking status: Former Smoker    Packs/day: 0.50    Years: 47.00    Pack years: 23.50    Types: Cigarettes    Quit date: 11/29/2018    Years since quitting: 1.0  . Smokeless tobacco: Never Used  . Tobacco comment: started at age 51  Substance and Sexual Activity  . Alcohol use: No    Alcohol/week: 0.0 standard drinks  . Drug use: No  . Sexual activity: Not Currently  Other Topics Concern  . Not on file  Social History Narrative   Pt had one son, who died when was 5 YO in a MVA.   Social Determinants of Health   Financial Resource Strain: Low Risk   . Difficulty of Paying Living Expenses: Not hard at all  Food Insecurity: No Food Insecurity  . Worried About  Charity fundraiser in the Last Year: Never true  . Ran Out of Food in the Last Year: Never true  Transportation Needs: No Transportation Needs  . Lack of Transportation (Medical): No  . Lack of Transportation (Non-Medical): No  Physical Activity: Inactive  . Days of Exercise per Week: 0 days  . Minutes of Exercise per Session: 0 min  Stress: Stress Concern Present  . Feeling of Stress : Very much  Social Connections: Moderately Isolated  . Frequency of Communication with Friends and Family: Twice a week  . Frequency of Social Gatherings with Friends and Family: Never  . Attends Religious Services: Never  .  Active Member of Clubs or Organizations: No  . Attends Archivist Meetings: Never  . Marital Status: Married    Outpatient Encounter Medications as of 11/30/2019  Medication Sig  . aspirin 81 MG tablet Take 81 mg by mouth daily.   Marland Kitchen atorvastatin (LIPITOR) 20 MG tablet TAKE 1 TABLET BY MOUTH AT  BEDTIME  . isosorbide mononitrate (IMDUR) 30 MG 24 hr tablet TAKE 1 TABLET BY MOUTH  DAILY  . pantoprazole (PROTONIX) 40 MG tablet Take 40 mg by mouth daily.   . polyethylene glycol powder (GLYCOLAX/MIRALAX) 17 GM/SCOOP powder Take 1-2 doses daily as needed for constipation/to prevent constipation  . traZODone (DESYREL) 100 MG tablet TAKE 1 TABLET BY MOUTH AT  BEDTIME AS NEEDED. FOR  SLEEP (Patient taking differently: Take 100 mg by mouth at bedtime. )  . carvedilol (COREG) 3.125 MG tablet Take 1 tablet (3.125 mg total) by mouth 2 (two) times daily. (Patient not taking: Reported on 11/30/2019)   No facility-administered encounter medications on file as of 11/30/2019.    Activities of Daily Living In your present state of health, do you have any difficulty performing the following activities: 11/30/2019  Hearing? Y  Comment Has hearing loss in left ear.  Vision? Y  Comment Has trouble seeing at night.  Difficulty concentrating or making decisions? N  Walking or climbing stairs? Y    Comment Due to SOB.  Dressing or bathing? N  Doing errands, shopping? Y  Comment Does not drive.  Preparing Food and eating ? N  Using the Toilet? N  In the past six months, have you accidently leaked urine? N  Do you have problems with loss of bowel control? N  Managing your Medications? N  Managing your Finances? N  Housekeeping or managing your Housekeeping? N  Some recent data might be hidden    Patient Care Team: Bacigalupo, Dionne Bucy, MD as PCP - General (Family Medicine) [name], MD as PCP - Cardiology (Cardiology) [name], OD as Consulting Physician (Optometry) [name], MD as Consulting Physician (Pulmonary Disease)    Assessment:   This is a routine wellness examination for [place].  Exercise Activities and Dietary recommendations Current Exercise Habits: The patient does not participate in regular exercise at present, Exercise limited by: respiratory conditions(s)  Goals    . Weight (lb) < 150 lb (68 kg)     Recommend cutting out bad carbohydrates in daily diet. Pt to focus on healthy carbs and spreading out servings in diet.        Fall Risk: Fall Risk  11/30/2019 06/05/2019 10/03/2018 03/13/2018 09/03/2017  Falls in the past year? 0 0 0 Yes Yes  Number falls in past yr: 0 - - 1 1  Injury with Fall? 0 - - Yes No  Comment - - - - -  Risk for fall due to : - - - - -  Follow up - - - Falls evaluation completed -    FALL RISK PREVENTION PERTAINING TO THE HOME:  Any stairs in or around the home? No  If so, are there any without handrails? N/A  Home free of loose throw rugs in walkways, pet beds, electrical cords, etc? Yes  Adequate lighting in your home to reduce risk of falls? Yes   ASSISTIVE DEVICES UTILIZED TO PREVENT FALLS:  Life alert? No  Use of a cane, walker or w/c? No  Grab bars in the bathroom? No  Shower chair or bench in shower? No  Elevated toilet seat or a handicapped toilet? No    TIMED UP AND GO:  Was the test  performed? No .    Depression Screen PHQ 2/9 Scores 11/30/2019 08/06/2019 06/05/2019 03/04/2019  PHQ - 2 Score 3 6 6  0  PHQ- 9 Score 12 18 20 7      Cognitive Function: Declined today.      6CIT Screen 10/03/2018 05/13/2017  What Year? 0 points 0 points  What month? 0 points 0 points  What time? 0 points 0 points  Count back from 20 0 points 0 points  Months in reverse 0 points 0 points  Repeat phrase 0 points 2 points  Total Score 0 2    Immunization History  Administered Date(s) Administered  . Influenza, High Dose Seasonal PF 06/16/2019  . Influenza, Seasonal, Injecte, Preservative Fre 08/18/2008, 07/14/2014  . Influenza,inj,Quad PF,6+ Mos 09/25/2013, 08/05/2015, 08/02/2016, 07/24/2017  . Pneumococcal Conjugate-13 03/13/2018  . Pneumococcal Polysaccharide-23 08/06/2019  . Tdap 08/08/2009    Qualifies for Shingles Vaccine? Yes . Due for Shingrix. Pt has been advised to call insurance company to determine out of pocket expense. Advised may also receive vaccine at local pharmacy or Health Dept. Verbalized acceptance and understanding.  Tdap: Pt declined today.   Flu Vaccine: Up to date  Pneumococcal Vaccine: Completed series  Screening Tests Health Maintenance  Topic Date Due  . MAMMOGRAM  05/21/2016  . DEXA SCAN  02/10/2018  . TETANUS/TDAP  11/29/2020 (Originally 08/09/2019)  . COLONOSCOPY  10/01/2021  . INFLUENZA VACCINE  Completed  . Hepatitis C Screening  Completed  . PNA vac Low Risk Adult  Completed    Cancer Screenings:  Colorectal Screening: Completed 10/01/16. Repeat every 5 years.   Mammogram: Completed 07/02/14. Repeat every 1-2 years as advised. Ordered today. Pt provided with contact info and advised to call to schedule appt.   Bone Density: Completed 02/25/07. Results reflect NORMAL, OSTEOPENIA, OSTEOPOROSIS. Repeat every 2 years. Pt declined order today.  Lung Cancer Screening: (Low Dose CT Chest recommended if Age 25-80 years, 30 pack-year currently  smoking OR have quit w/in 15years.) does qualify. An Epic message has been sent to Burgess Estelle, RN (Oncology Nurse Navigator) regarding the possible need for this exam. Raquel Sarna will review the patient's chart to determine if the patient truly qualifies for the exam. If the patient qualifies, Raquel Sarna will order the Low Dose CT of the chest to facilitate the scheduling of this exam.  Additional Screening:  Hepatitis C Screening: Up to date  Vision Screening: Recommended annual ophthalmology exams for early detection of glaucoma and other disorders of the eye.  Dental Screening: Recommended annual dental exams for proper oral hygiene  Community Resource Referral:  CRR required this visit? Yes, referral sent for a shower seat and grab bars in the shower.     Plan:  I have personally reviewed and addressed the Medicare Annual Wellness questionnaire and have noted the following in the patient's chart:  A. Medical and social history B. Use of alcohol, tobacco or illicit drugs  C. Current medications and supplements D. Functional ability and status E.  Nutritional status F.  Physical activity G. Advance directives H. List of other physicians I.  Hospitalizations, surgeries, and ER visits in previous 12 months J.  [place] such as hearing and vision if needed, cognitive and depression L. Referrals and appointments   In addition, I have reviewed and discussed with patient certain preventive protocols, quality metrics, and best practice recommendations. A written  personalized care plan for preventive services as well as general preventive health recommendations were provided to patient. Nurse Health Advisor  Signed,    Ares Tegtmeyer [place], [place]  [id number] Nurse Health Advisor   Nurse Notes: Pt declined a DEXA referral today. Mammogram ordered.

## 2019-11-30 ENCOUNTER — Ambulatory Visit (INDEPENDENT_AMBULATORY_CARE_PROVIDER_SITE_OTHER): Payer: Medicare Other

## 2019-11-30 ENCOUNTER — Encounter: Payer: Self-pay | Admitting: Family Medicine

## 2019-11-30 ENCOUNTER — Ambulatory Visit (INDEPENDENT_AMBULATORY_CARE_PROVIDER_SITE_OTHER): Payer: Medicare Other | Admitting: Family Medicine

## 2019-11-30 ENCOUNTER — Other Ambulatory Visit: Payer: Self-pay

## 2019-11-30 VITALS — BP 116/60 | HR 83 | Temp 98.1°F | Ht 63.0 in | Wt 233.8 lb

## 2019-11-30 VITALS — BP 116/60 | HR 83 | Temp 98.1°F | Resp 16 | Ht 63.0 in | Wt 233.0 lb

## 2019-11-30 DIAGNOSIS — I25118 Atherosclerotic heart disease of native coronary artery with other forms of angina pectoris: Secondary | ICD-10-CM

## 2019-11-30 DIAGNOSIS — Z1231 Encounter for screening mammogram for malignant neoplasm of breast: Secondary | ICD-10-CM

## 2019-11-30 DIAGNOSIS — Z0000 Encounter for general adult medical examination without abnormal findings: Secondary | ICD-10-CM | POA: Diagnosis not present

## 2019-11-30 DIAGNOSIS — D649 Anemia, unspecified: Secondary | ICD-10-CM | POA: Diagnosis not present

## 2019-11-30 DIAGNOSIS — E782 Mixed hyperlipidemia: Secondary | ICD-10-CM

## 2019-11-30 DIAGNOSIS — R739 Hyperglycemia, unspecified: Secondary | ICD-10-CM

## 2019-11-30 DIAGNOSIS — I10 Essential (primary) hypertension: Secondary | ICD-10-CM

## 2019-11-30 DIAGNOSIS — F5104 Psychophysiologic insomnia: Secondary | ICD-10-CM

## 2019-11-30 DIAGNOSIS — F331 Major depressive disorder, recurrent, moderate: Secondary | ICD-10-CM

## 2019-11-30 DIAGNOSIS — F411 Generalized anxiety disorder: Secondary | ICD-10-CM

## 2019-11-30 MED ORDER — DULOXETINE HCL 30 MG PO CPEP: 30.0000 mg | ORAL_CAPSULE | Freq: Every day | ORAL | 3 refills | Status: DC

## 2019-11-30 MED ORDER — TRAZODONE HCL 150 MG PO TABS: 150.0000 mg | ORAL_TABLET | Freq: Every evening | ORAL | 3 refills | Status: DC | PRN

## 2019-11-30 NOTE — Progress Notes (Signed)
Patient: Kim Buchanan, Female    DOB: 03-31-1951, 68 y.o.   MRN: [id number] Visit Date: 11/30/2019  Today's Provider: Lavon Paganini, MD   Chief Complaint  Patient presents with  . Annual Exam   Subjective:    I [place] S. Dimas, CMA, am acting as scribe for Lavon Paganini, MD.   Complete Physical Kim Buchanan is a 69 y.o. female. She feels fairly well. She reports exercising none. She reports she is sleeping poorly. 11/30/2019 AWV with [facility] 10/03/2018 CPE 10/31/2016 Pap/HPV-negative 05/21/2014 Mammogram-BI-RADS 2 10/01/2016 Colonoscopy-polyps, internal hemorrhoids-Hyperplastic polyps -repeat in 5 years Patient declines DEXA Mammogram was ordered during her annual wellness visit and she was instructed to call to schedule this ----------------------------------------------------------- Patient reports the trazodone is helping some with her sleep, but she continues to have some sleep disturbance and wonders if the dose can be increased.  She states that her anxiety is not well controlled.  She does not want to take Effexor as she did not feel like this was helpful.  We discussed that she was on the lowest dose and that a higher dose may be more effective, but she declines.  She is not a candidate for benzo therapy given her history of accidental benzo overdose in 11/10/2017 and 01/08/2018.  GAD 7 : Generalized Anxiety Score 11/30/2019 08/06/2019 06/05/2019 03/13/2018  Nervous, Anxious, on Edge 3 1 2 3   Control/stop worrying 3 1 2 3   Worry too much - different things 3 3 3 3   Trouble relaxing 1 3 3 3   Restless 0 0 0 3  Easily annoyed or irritable 3 0 2 3  Afraid - awful might happen 0 3 3 3   Total GAD 7 Score 13 11 15 21   Anxiety Difficulty - Somewhat difficult Somewhat difficult Somewhat difficult     Depression screen [facility] 2/9 11/30/2019 08/06/2019 06/05/2019 03/04/2019 10/03/2018  Decreased Interest 1 3 3  0 1  Down, Depressed, Hopeless 2 3 3  0 0  PHQ - 2 Score  3 6 6  0 1  Altered sleeping 0 3 2 2  -  Tired, decreased energy 3 3 3 2  -  Change in appetite 3 3 3  0 -  Feeling bad or failure about yourself  3 3 0 1 -  Trouble concentrating 0 0 3 2 -  Moving slowly or fidgety/restless 0 0 3 0 -  Suicidal thoughts 0 0 0 0 -  PHQ-9 Score 12 18 20 7  -  Difficult doing work/chores Not difficult at all Somewhat difficult Somewhat difficult Not difficult at all -  Some recent data might be hidden     Review of Systems  Constitutional: Positive for unexpected weight change. Negative for activity change, appetite change, chills, diaphoresis, fatigue and fever.  HENT: Negative.   Eyes: Positive for photophobia. Negative for pain, discharge, redness, itching and visual disturbance.  Respiratory: Positive for shortness of breath. Negative for apnea, cough, choking, chest tightness, wheezing and stridor.   Cardiovascular: Negative.   Gastrointestinal: Positive for abdominal distention. Negative for abdominal pain, anal bleeding, blood in stool, constipation, diarrhea, nausea, rectal pain and vomiting.  Endocrine: Negative.   Genitourinary: Negative.   Musculoskeletal: Negative.   Skin: Negative.   Allergic/Immunologic: Negative.   Neurological: Negative.   Hematological: Negative.   Psychiatric/Behavioral: Negative.     Social History   Socioeconomic History  . Marital status: Married    Spouse name: Paediatric nurse  . Number of children: 1  . Years of education:  Not on file  . Highest education level: 9th grade  Occupational History    Employer: DISABLED    Comment: for panic attacks  Tobacco Use  . Smoking status: Former Smoker    Packs/day: 0.50    Years: 47.00    Pack years: 23.50    Types: Cigarettes    Quit date: 11/29/2018    Years since quitting: 1.0  . Smokeless tobacco: Never Used  . Tobacco comment: started at age 85  Substance and Sexual Activity  . Alcohol use: No    Alcohol/week: 0.0 standard drinks  . Drug use: No  . Sexual activity:  Not Currently  Other Topics Concern  . Not on file  Social History Narrative   Pt had one son, who died when was 4 YO in a MVA.   Social Determinants of Health   Financial Resource Strain: Low Risk   . Difficulty of Paying Living Expenses: Not hard at all  Food Insecurity: No Food Insecurity  . Worried About Charity fundraiser in the Last Year: Never true  . Ran Out of Food in the Last Year: Never true  Transportation Needs: No Transportation Needs  . Lack of Transportation (Medical): No  . Lack of Transportation (Non-Medical): No  Physical Activity: Inactive  . Days of Exercise per Week: 0 days  . Minutes of Exercise per Session: 0 min  Stress: Stress Concern Present  . Feeling of Stress : Very much  Social Connections: Moderately Isolated  . Frequency of Communication with Friends and Family: Twice a week  . Frequency of Social Gatherings with Friends and Family: Never  . Attends Religious Services: Never  . Active Member of Clubs or Organizations: No  . Attends Archivist Meetings: Never  . Marital Status: Married  Human resources officer Violence: Not At Risk  . Fear of Current or Ex-Partner: No  . Emotionally Abused: No  . Physically Abused: No  . Sexually Abused: No    Past Medical History:  Diagnosis Date  . Anxiety   . Arthritis    lower back  . COPD (chronic obstructive pulmonary disease) ([place])   . Depression   . GERD (gastroesophageal reflux disease)   . Hyperlipidemia   . Morbid obesity ([place])   . NICM (nonischemic cardiomyopathy) ([place])    a. 03/2019 Echo: Nl LVEF; b. 09/04/2019 Echo: EF 25-30%, Gr1 DD, glob HK, nl RV fxn; c. 09/11/2019 Cath: nonobs LAD/RCA dzs. EF 50-55%. RA 10, RV 29/8(11), PA 27/16(20), PCWP 12. CO/CI 5.04/2.51.  Marland Kitchen Nonobstructive CAD (coronary artery disease)    a. 03/2019 [facility] [place]): EF 63%, ant ischemia; b. 08/2019 Cath: LM nl, LAD 5m, RCA 70p (iFR nl @ 0.97), 50p. EF 50-55%-->Med Rx.  . Osteoporosis   . Panic attack   . PTSD  (post-traumatic stress disorder)   . Rectal fistula   . Sleep apnea    a. Prev wore CPAP but lost insurance and hasn't been using since (now has insurance).  . Wears dentures    full upper     Patient Active Problem List   Diagnosis Date Noted  . Dilated cardiomyopathy ([place]) 09/11/2019  . Shortness of breath 09/11/2019  . CAD (coronary artery disease), native coronary artery 09/11/2019  . Acute gastritis without hemorrhage   . Hypertension 06/05/2019  . Binge eating 10/31/2018  . Morbid obesity ([place]) 10/03/2018  . Anemia 10/03/2018  . Left bundle branch block 06/13/2018  . COPD (chronic obstructive pulmonary disease) ([place]) 03/13/2018  . Sedative  or hypnotic overdose 11/20/2017  . Psychophysiological insomnia 11/06/2017  . Combined fat and carbohydrate induced hyperlipemia 07/19/2017  . S/P vaginal hysterectomy 12/17/2016  . Vaginal atrophy 10/31/2016  . Hx of colonic polyps   . Benign neoplasm of transverse colon   . Benign neoplasm of descending colon   . Polyp of sigmoid colon   . DDD (degenerative disc disease), lumbar 02/21/2016  . Low back pain 02/14/2016  . Rectal prolapse 07/13/2015  . Prolapsed external hemorrhoids 06/06/2015  . GAD (generalized anxiety disorder) 06/06/2015  . MDD (major depressive disorder) 06/06/2015  . Panic attack 06/06/2015  . Gastroesophageal reflux disease 06/06/2015    Past Surgical History:  Procedure Laterality Date  . COLONOSCOPY  2014  . COLONOSCOPY WITH PROPOFOL N/A 10/01/2016   Procedure: COLONOSCOPY WITH PROPOFOL;  Surgeon: Lucilla Lame, MD;  Location: [facility] [place];  Service: Endoscopy;  Laterality: N/A;  . CORONARY PRESSURE WIRE/FFR WITH 3D MAPPING N/A 09/11/2019   Procedure: Coronary Pressure Wire/FFR w/3D Mapping;  Surgeon: Nelva Bush, MD;  Location: [place] [facility];  Service: Cardiovascular;  Laterality: N/A;  . CYSTOCELE REPAIR N/A 12/17/2016   Procedure: ANTERIOR REPAIR (CYSTOCELE);  Surgeon: Brayton Mars, MD;  Location: [facility];  Service: Gynecology;  Laterality: N/A;  . ESOPHAGOGASTRODUODENOSCOPY (EGD) WITH PROPOFOL N/A 08/11/2019   Procedure: ESOPHAGOGASTRODUODENOSCOPY (EGD) WITH PROPOFOL;  Surgeon: Lucilla Lame, MD;  Location: [facility];  Service: Endoscopy;  Laterality: N/A;  . POLYPECTOMY  10/01/2016   Procedure: POLYPECTOMY;  Surgeon: Lucilla Lame, MD;  Location: [place];  Service: Endoscopy;;  . RECTAL SURGERY  06/28/2015   Rectal prolapse, laparoscopic rectopexy the coldCharm[place], MD; [facility]  . RIGHT/LEFT HEART CATH AND CORONARY ANGIOGRAPHY Bilateral 09/11/2019   Procedure: RIGHT/LEFT HEART CATH AND CORONARY ANGIOGRAPHY;  Surgeon: [name], MD;  Location: [place] [facility];  Service: Cardiovascular;  Laterality: Bilateral;  . TUBAL LIGATION    . VAGINAL HYSTERECTOMY Bilateral 12/17/2016   Procedure: HYSTERECTOMY VAGINAL WITH BILATERAL SALPINGO OOPHERECTOMY;  Surgeon: Brayton Mars, MD;  Location: [facility];  Service: Gynecology;  Laterality: Bilateral;    Her family history includes Cancer in her sister; Diabetes in her brother, brother, mother, sister, and sister; Healthy in her sister; Heart disease in her brother and father; Hypertension in her father; Lymphoma in her brother and brother. There is no history of Breast cancer, Ovarian cancer, or Colon cancer.   Current Outpatient Medications:  .  aspirin 81 MG tablet, Take 81 mg by mouth daily. , Disp: , Rfl:  .  atorvastatin (LIPITOR) 20 MG tablet, TAKE 1 TABLET BY MOUTH AT  BEDTIME, Disp: 90 tablet, Rfl: 3 .  carvedilol (COREG) 3.125 MG tablet, Take 1 tablet (3.125 mg total) by mouth 2 (two) times daily. (Patient not taking: Reported on 11/30/2019), Disp: 180 tablet, Rfl: 3 .  isosorbide mononitrate (IMDUR) 30 MG 24 hr tablet, TAKE 1 TABLET BY MOUTH  DAILY, Disp: 90 tablet, Rfl: 3 .  pantoprazole (PROTONIX) 40 MG tablet, Take 40 mg by mouth daily. , Disp: , Rfl:  .  polyethylene glycol powder  (GLYCOLAX/MIRALAX) 17 GM/SCOOP powder, Take 1-2 doses daily as needed for constipation/to prevent constipation, Disp: , Rfl:  .  traZODone (DESYREL) 100 MG tablet, TAKE 1 TABLET BY MOUTH AT  BEDTIME AS NEEDED. FOR  SLEEP (Patient taking differently: Take 100 mg by mouth at bedtime. ), Disp: 90 tablet, Rfl: 3  Patient Care Team: Virginia Crews, MD as PCP - General (Family Medicine) Rockey Situ,  Kathlene November, MD as PCP - Cardiology (Cardiology) [name], OD as Consulting Physician (Optometry) [name], MD as Consulting Physician (Pulmonary Disease)     Objective:    Vitals: BP 116/60 (BP Location: Right Arm, Patient Position: Sitting, Cuff Size: Normal)   Pulse 83   Temp 98.1 F (36.7 C) (Oral)   Resp 16   Ht 5\' 3"  (1.6 m)   Wt 233 lb (105.7 kg)   BMI 41.27 kg/m   Physical Exam Vitals reviewed.  Constitutional:      General: She is not in acute distress.    Appearance: Normal appearance. She is well-developed. She is not diaphoretic.  HENT:     Head: Normocephalic and atraumatic.     Right Ear: Tympanic membrane, ear canal and external ear normal.     Left Ear: Tympanic membrane, ear canal and external ear normal.  Eyes:     General: No scleral icterus.    Conjunctiva/sclera: Conjunctivae normal.     Pupils: Pupils are equal, round, and reactive to light.  Neck:     Thyroid: No thyromegaly.  Cardiovascular:     Rate and Rhythm: Normal rate and regular rhythm.     Pulses: Normal pulses.     Heart sounds: Normal heart sounds. No murmur.  Pulmonary:     Effort: Pulmonary effort is normal. No respiratory distress.     Breath sounds: Normal breath sounds. No wheezing or rales.  Abdominal:     General: There is no distension.     Palpations: Abdomen is soft.     Tenderness: There is no abdominal tenderness. There is no guarding or rebound.  Musculoskeletal:        General: No deformity.     Cervical back: Neck supple.     Right lower leg: No edema.     Left  lower leg: No edema.  Lymphadenopathy:     Cervical: No cervical adenopathy.  Skin:    General: Skin is warm and dry.     Capillary Refill: Capillary refill takes less than 2 seconds.     Findings: No rash.  Neurological:     Mental Status: She is alert and oriented to person, place, and time. Mental status is at baseline.  Psychiatric:        Mood and Affect: Mood is anxious and depressed. Affect is flat.        Behavior: Behavior normal.        Thought Content: Thought content normal.     Activities of Daily Living In your present state of health, do you have any difficulty performing the following activities: 11/30/2019  Hearing? Y  Comment Has hearing loss in left ear.  Vision? Y  Comment Has trouble seeing at night.  Difficulty concentrating or making decisions? N  Walking or climbing stairs? Y  Comment Due to SOB.  Dressing or bathing? N  Doing errands, shopping? Y  Comment Does not drive.  Preparing Food and eating ? N  Using the Toilet? N  In the past six months, have you accidently leaked urine? N  Do you have problems with loss of bowel control? N  Managing your Medications? N  Managing your Finances? N  Housekeeping or managing your Housekeeping? N  Some recent data might be hidden    Fall Risk Assessment Fall Risk  11/30/2019 06/05/2019 10/03/2018 03/13/2018 09/03/2017  Falls in the past year? 0 0 0 Yes Yes  Number falls in past yr: 0 - -  1 1  Injury with Fall? 0 - - Yes No  Comment - - - - -  Risk for fall due to : - - - - -  Follow up - - - Falls evaluation completed -     Depression Screen PHQ 2/9 Scores 11/30/2019 08/06/2019 06/05/2019 03/04/2019  PHQ - 2 Score 3 6 6  0  PHQ- 9 Score 12 18 20 7     6CIT Screen 10/03/2018  What Year? 0 points  What month? 0 points  What time? 0 points  Count back from 20 0 points  Months in reverse 0 points  Repeat phrase 0 points  Total Score 0   Audit-C Alcohol Use Screening   Alcohol Use Disorder Test (AUDIT)  11/30/2019  1. How often do you have a drink containing alcohol? 0  2. How many drinks containing alcohol do you have on a typical day when you are drinking? 0  3. How often do you have six or more drinks on one occasion? 0  AUDIT-C Score 0    A score of 3 or more in women, and 4 or more in men indicates increased risk for alcohol abuse, EXCEPT if all of the points are from question 1     Assessment & Plan:    Annual Physical Reviewed patient's Family Medical History Reviewed and updated list of patient's medical providers Assessment of cognitive impairment was done Assessed patient's functional ability Established a written schedule for health screening [place] Completed and Reviewed  Exercise Activities and Dietary recommendations Goals    . Weight (lb) < 150 lb (68 kg)     Recommend cutting out bad carbohydrates in daily diet. Pt to focus on healthy carbs and spreading out servings in diet.        Immunization History  Administered Date(s) Administered  . Influenza, High Dose Seasonal PF 06/16/2019  . Influenza, Seasonal, Injecte, Preservative Fre 08/18/2008, 07/14/2014  . Influenza,inj,Quad PF,6+ Mos 09/25/2013, 08/05/2015, 08/02/2016, 07/24/2017  . Pneumococcal Conjugate-13 03/13/2018  . Pneumococcal Polysaccharide-23 08/06/2019  . Tdap 08/08/2009    Health Maintenance  Topic Date Due  . MAMMOGRAM  05/21/2016  . DEXA SCAN  02/10/2018  . TETANUS/TDAP  11/29/2020 (Originally 08/09/2019)  . COLONOSCOPY  10/01/2021  . INFLUENZA VACCINE  Completed  . Hepatitis C Screening  Completed  . PNA vac Low Risk Adult  Completed     Discussed health benefits of physical activity, and encouraged her to engage in regular exercise appropriate for her age and condition.    ------------------------------------------------------------------------------------------------------------  Problem List Items Addressed This Visit      Cardiovascular and  Mediastinum   Hypertension    Well controlled Continue current medications Recheck metabolic panel F/u in 6 months       Relevant Orders   Comprehensive metabolic panel   Lipid panel   CAD (coronary artery disease), native coronary artery    Chronic and stable Followed by cardiology Discussed importance of risk factor management      Relevant Orders   Comprehensive metabolic panel   Lipid panel     Other   GAD (generalized anxiety disorder)    Chronic and uncontrolled Start Cymbalta 30 mg daily Discussed possible side effects Encourage therapy Avoid benzos or other sedating medications given her history of accidental overdose      Relevant Medications   DULoxetine (CYMBALTA) 30 MG capsule   traZODone (DESYREL) 150 MG tablet   MDD (major depressive disorder)    Chronic and  uncontrolled She declines trying Effexor again Discussed that Cymbalta may also help with some of her chronic low back pain, so she agrees to start this Start Cymbalta 30 mg daily Discussed synergism between therapy medications Discussed possible side effects of Cymbalta including GI upset, decreased libido, increased SI Contracted for safety-no SI/HI Follow-up in 6 to 8 weeks      Relevant Medications   DULoxetine (CYMBALTA) 30 MG capsule   traZODone (DESYREL) 150 MG tablet   Combined fat and carbohydrate induced hyperlipemia    Continue statin Recheck FLP and CMP Previously well controlled Goal LDL less than 70      Psychophysiological insomnia    Doing well on trazodone Increase dose to 150 mg nightly Discussed sleep hygiene Likely related to uncontrolled anxiety      Morbid obesity ([place])    Discussed importance of healthy weight management Discussed diet and exercise       Relevant Orders   Comprehensive metabolic panel   CBC   Lipid panel   Hemoglobin A1c   Anemia    Was initially noted during hospitalization in 12/2017 She has undergone EGD Recheck CBC today       Relevant Orders   CBC    Other Visit Diagnoses    Encounter for annual physical exam    -  Primary   Relevant Orders   Comprehensive metabolic panel   CBC   Lipid panel   Hemoglobin A1c   Hyperglycemia       Relevant Orders   Hemoglobin A1c       Return in about 2 months (around 01/28/2020) for MDD/GAD f/u - virtual ok.   The entirety of the information documented in the History of Present Illness, Review of Systems and Physical Exam were personally obtained by me. Portions of this information were initially documented by Lynford Humphrey, CMA and reviewed by me for thoroughness and accuracy.    Mylena Sedberry, Dionne Bucy, MD MPH [facility]

## 2019-11-30 NOTE — Assessment & Plan Note (Signed)
Continue statin Recheck FLP and CMP Previously well controlled Goal LDL less than 70

## 2019-11-30 NOTE — Assessment & Plan Note (Signed)
Well controlled Continue current medications Recheck metabolic panel F/u in 6 months  

## 2019-11-30 NOTE — Assessment & Plan Note (Signed)
Chronic and uncontrolled Start Cymbalta 30 mg daily Discussed possible side effects Encourage therapy Avoid benzos or other sedating medications given her history of accidental overdose

## 2019-11-30 NOTE — Assessment & Plan Note (Signed)
Doing well on trazodone Increase dose to 150 mg nightly Discussed sleep hygiene Likely related to uncontrolled anxiety

## 2019-11-30 NOTE — Assessment & Plan Note (Signed)
Chronic and uncontrolled She declines trying Effexor again Discussed that Cymbalta may also help with some of her chronic low back pain, so she agrees to start this Start Cymbalta 30 mg daily Discussed synergism between therapy medications Discussed possible side effects of Cymbalta including GI upset, decreased libido, increased SI Contracted for safety-no SI/HI Follow-up in 6 to 8 weeks

## 2019-11-30 NOTE — Assessment & Plan Note (Signed)
Was initially noted during hospitalization in 12/2017 She has undergone EGD Recheck CBC today

## 2019-11-30 NOTE — Patient Instructions (Addendum)
Kim Buchanan , Thank you for taking time to come for your Medicare Wellness Visit. I appreciate your ongoing commitment to your health goals. Please review the following plan we discussed and let me know if I can assist you in the future.   Screening recommendations/referrals: Colonoscopy: Up to date, due 09/2021 Mammogram: Ordered today. Pt provided with contact info and advised to call to schedule appt. Bone Density: Currently due however declined today.  Recommended yearly ophthalmology/optometry visit for glaucoma screening and checkup Recommended yearly dental visit for hygiene and checkup  Vaccinations: Influenza vaccine: Up to date Pneumococcal vaccine: Completed series Tdap vaccine: Pt declines today.  Shingles vaccine: Pt declines today.     Advanced directives: Please bring a copy of your POA (Power of Attorney) and/or Living Will to your next appointment.   Conditions/risks identified: Recommend to cut back on portion sizes, avoid pastas, bread, sodas, fatty foods and meat. Increase water intake and fruit and vegetable consumption to help aid in weight loss.   Next appointment: 10:40 AM today with [name]. Declined scheduling an AWV for 2022 at this time.    Preventive Care 7 Years and Older, Female Preventive care refers to lifestyle choices and visits with your health care provider that can promote health and wellness. What does preventive care include?  A yearly physical exam. This is also called an annual well check.  Dental exams once or twice a year.  Routine eye exams. Ask your health care provider how often you should have your eyes checked.  Personal lifestyle choices, including:  Daily care of your teeth and gums.  Regular physical activity.  Eating a healthy diet.  Avoiding tobacco and drug use.  Limiting alcohol use.  Practicing safe sex.  Taking low-dose aspirin every day.  Taking vitamin and mineral supplements as recommended by your  health care provider. What happens during an annual well check? The services and screenings done by your health care provider during your annual well check will depend on your age, overall health, lifestyle risk factors, and family history of disease. Counseling  Your health care provider may ask you questions about your:  Alcohol use.  Tobacco use.  Drug use.  Emotional well-being.  Home and relationship well-being.  Sexual activity.  Eating habits.  History of falls.  Memory and ability to understand (cognition).  Work and work Statistician.  Reproductive health. Screening  You may have the following tests or measurements:  Height, weight, and BMI.  Blood pressure.  Lipid and cholesterol levels. These may be checked every 5 years, or more frequently if you are over 79 years old.  Skin check.  Lung cancer screening. You may have this screening every year starting at age 60 if you have a 30-pack-year history of smoking and currently smoke or have quit within the past 15 years.  Fecal occult blood test (FOBT) of the stool. You may have this test every year starting at age 66.  Flexible sigmoidoscopy or colonoscopy. You may have a sigmoidoscopy every 5 years or a colonoscopy every 10 years starting at age 15.  Hepatitis C blood test.  Hepatitis B blood test.  Sexually transmitted disease (STD) testing.  Diabetes screening. This is done by checking your blood sugar (glucose) after you have not eaten for a while (fasting). You may have this done every 1-3 years.  Bone density scan. This is done to screen for osteoporosis. You may have this done starting at age 35.  Mammogram. This may be done  every 1-2 years. Talk to your health care provider about how often you should have regular mammograms. Talk with your health care provider about your test results, treatment options, and if necessary, the need for more tests. Vaccines  Your health care provider may recommend  certain vaccines, such as:  Influenza vaccine. This is recommended every year.  Tetanus, diphtheria, and acellular pertussis (Tdap, Td) vaccine. You may need a Td booster every 10 years.  Zoster vaccine. You may need this after age 57.  Pneumococcal 13-valent conjugate (PCV13) vaccine. One dose is recommended after age 36.  Pneumococcal polysaccharide (PPSV23) vaccine. One dose is recommended after age 35. Talk to your health care provider about which screenings and vaccines you need and how often you need them. This information is not intended to replace advice given to you by your health care provider. Make sure you discuss any questions you have with your health care provider. Document Released: 11/04/2015 Document Revised: 06/27/2016 Document Reviewed: 08/09/2015 Elsevier Interactive Patient Education  2017 [place] Prevention in the Home Falls can cause injuries. They can happen to people of all ages. There are many things you can do to make your home safe and to help prevent falls. What can I do on the outside of my home?  Regularly fix the edges of walkways and driveways and fix any cracks.  Remove anything that might make you trip as you walk through a door, such as a raised step or threshold.  Trim any bushes or trees on the path to your home.  Use bright outdoor lighting.  Clear any walking paths of anything that might make someone trip, such as rocks or tools.  Regularly check to see if handrails are loose or broken. Make sure that both sides of any steps have handrails.  Any raised decks and porches should have guardrails on the edges.  Have any leaves, snow, or ice cleared regularly.  Use sand or salt on walking paths during winter.  Clean up any spills in your garage right away. This includes oil or grease spills. What can I do in the bathroom?  Use night lights.  Install grab bars by the toilet and in the tub and shower. Do not use towel bars as  grab bars.  Use non-skid mats or decals in the tub or shower.  If you need to sit down in the shower, use a plastic, non-slip stool.  Keep the floor dry. Clean up any water that spills on the floor as soon as it happens.  Remove soap buildup in the tub or shower regularly.  Attach bath mats securely with double-sided non-slip rug tape.  Do not have throw rugs and other things on the floor that can make you trip. What can I do in the bedroom?  Use night lights.  Make sure that you have a light by your bed that is easy to reach.  Do not use any sheets or blankets that are too big for your bed. They should not hang down onto the floor.  Have a firm chair that has side arms. You can use this for support while you get dressed.  Do not have throw rugs and other things on the floor that can make you trip. What can I do in the kitchen?  Clean up any spills right away.  Avoid walking on wet floors.  Keep items that you use a lot in easy-to-reach places.  If you need to reach something above you, use a  strong step stool that has a grab bar.  Keep electrical cords out of the way.  Do not use floor polish or wax that makes floors slippery. If you must use wax, use non-skid floor wax.  Do not have throw rugs and other things on the floor that can make you trip. What can I do with my stairs?  Do not leave any items on the stairs.  Make sure that there are handrails on both sides of the stairs and use them. Fix handrails that are broken or loose. Make sure that handrails are as long as the stairways.  Check any carpeting to make sure that it is firmly attached to the stairs. Fix any carpet that is loose or worn.  Avoid having throw rugs at the top or bottom of the stairs. If you do have throw rugs, attach them to the floor with carpet tape.  Make sure that you have a light switch at the top of the stairs and the bottom of the stairs. If you do not have them, ask someone to add them  for you. What else can I do to help prevent falls?  Wear shoes that:  Do not have high heels.  Have rubber bottoms.  Are comfortable and fit you well.  Are closed at the toe. Do not wear sandals.  If you use a stepladder:  Make sure that it is fully opened. Do not climb a closed stepladder.  Make sure that both sides of the stepladder are locked into place.  Ask someone to hold it for you, if possible.  Clearly mark and make sure that you can see:  Any grab bars or handrails.  First and last steps.  Where the edge of each step is.  Use tools that help you move around (mobility aids) if they are needed. These include:  Canes.  Walkers.  Scooters.  Crutches.  Turn on the lights when you go into a dark area. Replace any light bulbs as soon as they burn out.  Set up your furniture so you have a clear path. Avoid moving your furniture around.  If any of your floors are uneven, fix them.  If there are any pets around you, be aware of where they are.  Review your medicines with your doctor. Some medicines can make you feel dizzy. This can increase your chance of falling. Ask your doctor what other things that you can do to help prevent falls. This information is not intended to replace advice given to you by your health care provider. Make sure you discuss any questions you have with your health care provider. Document Released: 08/04/2009 Document Revised: 03/15/2016 Document Reviewed: 11/12/2014 Elsevier Interactive Patient Education  2017 [employer].

## 2019-11-30 NOTE — Patient Instructions (Signed)
Preventive Care 67 Years and Older, Female Preventive care refers to lifestyle choices and visits with your health care provider that can promote health and wellness. This includes:  A yearly physical exam. This is also called an annual well check.  Regular dental and eye exams.  Immunizations.  Screening for certain conditions.  Healthy lifestyle choices, such as diet and exercise. What can I expect for my preventive care visit? Physical exam Your health care provider will check:  Height and weight. These may be used to calculate body mass index (BMI), which is a measurement that tells if you are at a healthy weight.  Heart rate and blood pressure.  Your skin for abnormal spots. Counseling Your health care provider may ask you questions about:  Alcohol, tobacco, and drug use.  Emotional well-being.  Home and relationship well-being.  Sexual activity.  Eating habits.  History of falls.  Memory and ability to understand (cognition).  Work and work Statistician.  Pregnancy and menstrual history. What immunizations do I need?  Influenza (flu) vaccine  This is recommended every year. Tetanus, diphtheria, and pertussis (Tdap) vaccine  You may need a Td booster every 10 years. Varicella (chickenpox) vaccine  You may need this vaccine if you have not already been vaccinated. Zoster (shingles) vaccine  You may need this after age 33. Pneumococcal conjugate (PCV13) vaccine  One dose is recommended after age 33. Pneumococcal polysaccharide (PPSV23) vaccine  One dose is recommended after age 72. Measles, mumps, and rubella (MMR) vaccine  You may need at least one dose of MMR if you were born in 1957 or later. You may also need a second dose. Meningococcal conjugate (MenACWY) vaccine  You may need this if you have certain conditions. Hepatitis A vaccine  You may need this if you have certain conditions or if you travel or work in places where you may be exposed  to hepatitis A. Hepatitis B vaccine  You may need this if you have certain conditions or if you travel or work in places where you may be exposed to hepatitis B. Haemophilus influenzae type b (Hib) vaccine  You may need this if you have certain conditions. You may receive vaccines as individual doses or as more than one vaccine together in one shot (combination vaccines). Talk with your health care provider about the risks and benefits of combination vaccines. What tests do I need? Blood tests  Lipid and cholesterol levels. These may be checked every 5 years, or more frequently depending on your overall health.  Hepatitis C test.  Hepatitis B test. Screening  Lung cancer screening. You may have this screening every year starting at age 39 if you have a 30-pack-year history of smoking and currently smoke or have quit within the past 15 years.  Colorectal cancer screening. All adults should have this screening starting at age 36 and continuing until age 15. Your health care provider may recommend screening at age 23 if you are at increased risk. You will have tests every 1-10 years, depending on your results and the type of screening test.  Diabetes screening. This is done by checking your blood sugar (glucose) after you have not eaten for a while (fasting). You may have this done every 1-3 years.  Mammogram. This may be done every 1-2 years. Talk with your health care provider about how often you should have regular mammograms.  BRCA-related cancer screening. This may be done if you have a family history of breast, ovarian, tubal, or peritoneal cancers.  Other tests  Sexually transmitted disease (STD) testing.  Bone density scan. This is done to screen for osteoporosis. You may have this done starting at age 44. Follow these instructions at home: Eating and drinking  Eat a diet that includes fresh fruits and vegetables, whole grains, lean protein, and low-fat dairy products. Limit  your intake of foods with high amounts of sugar, saturated fats, and salt.  Take vitamin and mineral supplements as recommended by your health care provider.  Do not drink alcohol if your health care provider tells you not to drink.  If you drink alcohol: ? Limit how much you have to 0-1 drink a day. ? Be aware of how much alcohol is in your drink. In the [place] one drink equals one 12 oz bottle of beer (355 mL), one 5 oz glass of wine (148 mL), or one 1 oz glass of hard liquor (44 mL). Lifestyle  Take daily care of your teeth and gums.  Stay active. Exercise for at least 30 minutes on 5 or more days each week.  Do not use any products that contain nicotine or tobacco, such as cigarettes, e-cigarettes, and chewing tobacco. If you need help quitting, ask your health care provider.  If you are sexually active, practice safe sex. Use a condom or other form of protection in order to prevent STIs (sexually transmitted infections).  Talk with your health care provider about taking a low-dose aspirin or statin. What's next?  Go to your health care provider once a year for a well check visit.  Ask your health care provider how often you should have your eyes and teeth checked.  Stay up to date on all vaccines. This information is not intended to replace advice given to you by your health care provider. Make sure you discuss any questions you have with your health care provider. Document Revised: 10/02/2018 Document Reviewed: 10/02/2018 Elsevier Patient Education  2020 [employer].

## 2019-11-30 NOTE — Assessment & Plan Note (Signed)
Discussed importance of healthy weight management Discussed diet and exercise  

## 2019-11-30 NOTE — Assessment & Plan Note (Signed)
Chronic and stable Followed by cardiology Discussed importance of risk factor management

## 2019-12-01 ENCOUNTER — Telehealth: Payer: Self-pay

## 2019-12-01 ENCOUNTER — Telehealth: Payer: Self-pay | Admitting: *Deleted

## 2019-12-01 LAB — COMPREHENSIVE METABOLIC PANEL
ALT: 48 IU/L — ABNORMAL HIGH (ref 0–32)
AST: 27 IU/L (ref 0–40)
Albumin/Globulin Ratio: 1.5 (ref 1.2–2.2)
Albumin: 4 g/dL (ref 3.8–4.8)
Alkaline Phosphatase: 96 IU/L (ref 39–117)
BUN/Creatinine Ratio: 17 (ref 12–28)
BUN: 16 mg/dL (ref 8–27)
Bilirubin Total: 0.4 mg/dL (ref 0.0–1.2)
CO2: 19 mmol/L — ABNORMAL LOW (ref 20–29)
Calcium: 9 mg/dL (ref 8.7–10.3)
Chloride: 107 mmol/L — ABNORMAL HIGH (ref 96–106)
Creatinine, Ser: 0.93 mg/dL (ref 0.57–1.00)
GFR calc Af Amer: 74 mL/min/{1.73_m2} (ref >59)
GFR calc non Af Amer: 64 mL/min/{1.73_m2} (ref >59)
Globulin, Total: 2.7 g/dL (ref 1.5–4.5)
Glucose: 101 mg/dL — ABNORMAL HIGH (ref 65–99)
Potassium: 4.6 mmol/L (ref 3.5–5.2)
Sodium: 144 mmol/L (ref 134–144)
Total Protein: 6.7 g/dL (ref 6.0–8.5)

## 2019-12-01 LAB — CBC
Hematocrit: 40.2 % (ref 34.0–46.6)
Hemoglobin: 13.2 g/dL (ref 11.1–15.9)
MCH: 27.2 pg (ref 26.6–33.0)
MCHC: 32.8 g/dL (ref 31.5–35.7)
MCV: 83 fL (ref 79–97)
Platelets: 306 10*3/uL (ref 150–450)
RBC: 4.85 x10E6/uL (ref 3.77–5.28)
RDW: 15.5 % — ABNORMAL HIGH (ref 11.7–15.4)
WBC: 7.3 10*3/uL (ref 3.4–10.8)

## 2019-12-01 LAB — HEMOGLOBIN A1C
Est. average glucose Bld gHb Est-mCnc: 143 mg/dL
Hgb A1c MFr Bld: 6.6 % — ABNORMAL HIGH (ref 4.8–5.6)

## 2019-12-01 LAB — LIPID PANEL
Chol/HDL Ratio: 1.5 ratio (ref 0.0–4.4)
Cholesterol, Total: 66 mg/dL — ABNORMAL LOW (ref 100–199)
HDL: 43 mg/dL (ref >39)
LDL Chol Calc (NIH): 7 mg/dL (ref 0–99)
Triglycerides: 69 mg/dL (ref 0–149)
VLDL Cholesterol Cal: 16 mg/dL (ref 5–40)

## 2019-12-01 NOTE — Telephone Encounter (Signed)
Contacted to schedule lung screening scan. Patient reports smoking history of, at most 1/2 pack per day x 47 years. Made patient aware that she does not meet the 30 pack year requirement for lung screening. Patient verbalizes understanding.

## 2019-12-01 NOTE — Telephone Encounter (Signed)
Copied from [place] [id number]. Topic: Referral - Status >> Dec 01, 2019  [id number] PM Kim Buchanan D wrote: [id number] Spoke with Kim Buchanan at [facility], they have a small and a large shower chair available.  Spoke with patient her husband can go look at the shower chairs and see which one will work in their shower, gave phone numbe and address and the time the office closes.  Called Rip Harbour to let her know to expect Mr. Kim Buchanan today by 4:30pm.  Left message on voicemail with [facility] to return my call regarding grab bar installation.   Ambrose Mantle [phone]

## 2019-12-01 NOTE — Telephone Encounter (Signed)
-----   Message from Virginia Crews, MD sent at 12/01/2019  8:09 AM EST ----- Normal kidney function, liver function, electrolytes.  Cholesterol is very low.  I would decrease her atorvastatin down to 10 mg daily.  We can send a new prescription to [facility] if patient agrees.  Her A1c, 6-month average of blood sugars, has now increased into the diabetic range.  I would like to see patient for a follow-up visit within the next 1 to 2 weeks to discuss new diagnosis of diabetes and diet and medications.

## 2019-12-01 NOTE — Telephone Encounter (Signed)
Pt advised.  Apt made for 12/11/2019 at 8am.  She will wait to change her atorvastatin at her appointment next week.   Thanks,   -Mickel Baas

## 2019-12-02 ENCOUNTER — Telehealth: Payer: Self-pay

## 2019-12-02 NOTE — Telephone Encounter (Signed)
Copied from [place] [id number]. Topic: Referral - Status >> Dec 02, 2019 0000000 PM [name] D wrote: [id number] Spoke with patient her husband was able to ge the shower chair from [employer].  I gave her the number for [facility] to apply over the phone this is the only way they accept referrals. Spoke with Renee at [facility] either the patient or a family member has to call and give information for the grab bar installation referral. They cannot accept referrals from a third party. Ambrose Mantle [phone]

## 2019-12-02 NOTE — Telephone Encounter (Signed)
Thank you so much for taking of this Kim Buchanan!

## 2019-12-09 ENCOUNTER — Ambulatory Visit: Payer: Medicare Other | Admitting: Pulmonary Disease

## 2019-12-11 ENCOUNTER — Encounter: Payer: Self-pay | Admitting: Family Medicine

## 2019-12-11 ENCOUNTER — Ambulatory Visit: Payer: Medicare Other | Admitting: Family Medicine

## 2019-12-11 ENCOUNTER — Ambulatory Visit (INDEPENDENT_AMBULATORY_CARE_PROVIDER_SITE_OTHER): Payer: Medicare Other | Admitting: Family Medicine

## 2019-12-11 ENCOUNTER — Other Ambulatory Visit: Payer: Self-pay

## 2019-12-11 VITALS — BP 132/74 | HR 103 | Ht 63.0 in | Wt 230.0 lb

## 2019-12-11 DIAGNOSIS — E1169 Type 2 diabetes mellitus with other specified complication: Secondary | ICD-10-CM

## 2019-12-11 DIAGNOSIS — E1142 Type 2 diabetes mellitus with diabetic polyneuropathy: Secondary | ICD-10-CM | POA: Insufficient documentation

## 2019-12-11 DIAGNOSIS — E119 Type 2 diabetes mellitus without complications: Secondary | ICD-10-CM | POA: Insufficient documentation

## 2019-12-11 NOTE — Progress Notes (Signed)
Patient: Kim Buchanan Female    DOB: 1951-07-09   69 y.o.   MRN: [id number] Visit Date: 12/11/2019  Today's Provider: Lavon Paganini, MD   Chief Complaint  Patient presents with  . Diabetes   Subjective:    I [place] S. Dimas, CMA, am acting as scribe for Lavon Paganini, MD. Virtual Visit via Telephone Note  I connected with Kim Buchanan on 12/11/19 at 11:40 AM EST by telephone and verified that I am speaking with the correct person using two identifiers.  Location: Patient location: home Provider location: [facility] Persons involved in the visit: patient, provider   I discussed the limitations, risks, security and privacy concerns of performing an evaluation and management service by telephone and the availability of in person appointments. I also discussed with the patient that there may be a patient responsible charge related to this service. The patient expressed understanding and agreed to proceed.   Diabetes She presents for her initial diabetic visit. She has type 2 diabetes mellitus. There are no hypoglycemic associated symptoms. Pertinent negatives for hypoglycemia include no confusion, dizziness, headaches, sweats or tremors. Pertinent negatives for diabetes include no blurred vision, no chest pain, no fatigue, no foot paresthesias, no foot ulcerations, no polydipsia, no polyphagia, no polyuria, no visual change, no weakness and no weight loss. There are no hypoglycemic complications. Symptoms are stable. Diabetic complications include heart disease. Risk factors for coronary artery disease include hypertension, obesity, dyslipidemia and diabetes mellitus. When asked about current treatments, none were reported. Her weight is stable. When asked about meal planning, she reported none. She has not had a previous visit with a dietitian. She never participates in exercise. Eye exam is not current (Dr. Ellin Mayhew).   AM fasting blood sugar this AM  was 130 Allergies  Allergen Reactions  . Lisinopril Cough     Current Outpatient Medications:  .  aspirin 81 MG tablet, Take 81 mg by mouth daily. , Disp: , Rfl:  .  atorvastatin (LIPITOR) 20 MG tablet, TAKE 1 TABLET BY MOUTH AT  BEDTIME, Disp: 90 tablet, Rfl: 3 .  DULoxetine (CYMBALTA) 30 MG capsule, Take 1 capsule (30 mg total) by mouth daily., Disp: 30 capsule, Rfl: 3 .  isosorbide mononitrate (IMDUR) 30 MG 24 hr tablet, TAKE 1 TABLET BY MOUTH  DAILY, Disp: 90 tablet, Rfl: 3 .  pantoprazole (PROTONIX) 40 MG tablet, Take 40 mg by mouth daily. , Disp: , Rfl:  .  polyethylene glycol powder (GLYCOLAX/MIRALAX) 17 GM/SCOOP powder, Take 1-2 doses daily as needed for constipation/to prevent constipation, Disp: , Rfl:  .  traZODone (DESYREL) 150 MG tablet, Take 1 tablet (150 mg total) by mouth at bedtime as needed. for sleep, Disp: 90 tablet, Rfl: 3  Review of Systems  Constitutional: Negative for fatigue and weight loss.  Eyes: Negative for blurred vision.  Cardiovascular: Negative for chest pain.  Endocrine: Negative for polydipsia, polyphagia and polyuria.  Neurological: Negative for dizziness, tremors, weakness and headaches.  Psychiatric/Behavioral: Negative for confusion.    Social History   Tobacco Use  . Smoking status: Former Smoker    Packs/day: 0.50    Years: 47.00    Pack years: 23.50    Types: Cigarettes    Quit date: 11/29/2018    Years since quitting: 1.0  . Smokeless tobacco: Never Used  . Tobacco comment: started at age 27  Substance Use Topics  . Alcohol use: No    Alcohol/week: 0.0 standard drinks  Objective:   BP 132/74 (BP Location: Left Arm, Patient Position: Sitting, Cuff Size: Normal)   Pulse (!) 103   Ht 5\' 3"  (1.6 m)   Wt 230 lb (104.3 kg)   BMI 40.74 kg/m  Vitals:   12/11/19 1131  BP: 132/74  Pulse: (!) 103  Weight: 230 lb (104.3 kg)  Height: 5\' 3"  (1.6 m)  Body mass index is 40.74 kg/m.   Physical Exam Speaking in full sentences in  no apparent distress  No results found for any visits on 12/11/19.     Assessment & Plan    I discussed the assessment and treatment plan with the patient. The patient was provided an opportunity to ask questions and all were answered. The patient agreed with the plan and demonstrated an understanding of the instructions.   The patient was advised to call back or seek an in-person evaluation if the symptoms worsen or if the condition fails to improve as anticipated.  Problem List Items Addressed This Visit      Endocrine   Diabetes mellitus ([place]) - Primary    New diagnosis with recent A1c of 6.6 Will start with lifestyle interventions of low carb diet and regular exercise No medications at this time Discussed long term risks of diabetes and importance of vaccines and various screenings She is UTD on vaccinations We will send off for her last eye exam, she is followed regularly by Dr. Ellin Mayhew Plan for foot exam and urine microalbumin at next in-person visit On statin At follow-up in about 3 months, recheck A1c Discussed goal A1c less than 7 Associated with/complicated by hypertension, hyperlipidemia, morbid obesity          Return in about 3 months (around 03/09/2020) for chronic disease f/u.   The entirety of the information documented in the History of Present Illness, Review of Systems and Physical Exam were personally obtained by me. Portions of this information were initially documented by Lynford Humphrey, CMA and reviewed by me for thoroughness and accuracy.    Keandrea Tapley, Dionne Bucy, MD MPH [facility]

## 2019-12-11 NOTE — Assessment & Plan Note (Signed)
New diagnosis with recent A1c of 6.6 Will start with lifestyle interventions of low carb diet and regular exercise No medications at this time Discussed long term risks of diabetes and importance of vaccines and various screenings She is UTD on vaccinations We will send off for her last eye exam, she is followed regularly by Dr. Ellin Mayhew Plan for foot exam and urine microalbumin at next in-person visit On statin At follow-up in about 3 months, recheck A1c Discussed goal A1c less than 7 Associated with/complicated by hypertension, hyperlipidemia, morbid obesity

## 2019-12-11 NOTE — Patient Instructions (Signed)
  Diet Recommendations for Diabetes   Starchy (carb) foods include: Bread, rice, pasta, potatoes, corn, crackers, bagels, muffins, all baked goods.  (Fruits, milk, and yogurt also have carbohydrate, but most of these foods will not spike your blood sugar as the starchy foods will.)  A few fruits do cause high blood sugars; use small portions of bananas (limit to 1/2 at a time), grapes, watermelon, and most tropical fruits.    Protein foods include: Meat, fish, poultry, eggs, dairy foods, and beans such as pinto and kidney beans (beans also provide carbohydrate).   1. Limit starchy foods to TWO per meal and ONE per snack. ONE portion of a starchy  food is equal to the following:   - ONE slice of bread (or its equivalent, such as half of a hamburger bun).   - 1/2 cup of a "scoopable" starchy food such as potatoes or rice.   - 15 grams of carbohydrate as shown on food label.  2. Include at every meal: a protein food, a carb food, and vegetables and/or fruit.   - Obtain twice as many veg's as protein or carbohydrate foods for both lunch and dinner.   - Fresh or frozen veg's are best.   - Try to keep frozen veg's on hand for a quick vegetable serving.        

## 2019-12-18 ENCOUNTER — Telehealth: Payer: Self-pay

## 2019-12-18 NOTE — Telephone Encounter (Signed)
Copied from [place] [id number]. Topic: Referral - Status >> Dec 18, 2019  [id number] PM [name] D wrote: [id number] Spoke with patient about grab bar installation, she has decided that she does  not need it and is fine with the shower chair. Patient has no other needs right now.  Ambrose Mantle [phone]

## 2020-01-07 ENCOUNTER — Telehealth: Payer: Self-pay

## 2020-01-07 NOTE — Telephone Encounter (Signed)
New onset of breast pain, appointment made for evaluation

## 2020-01-07 NOTE — Telephone Encounter (Signed)
Copied from [place] [id number]. Topic: Referral - Request for Referral >> Jan 07, 2020  3:21 PM Celene Kras wrote: Has patient seen PCP for this complaint? Yes.   *If NO, is insurance requiring patient see PCP for this issue before PCP can refer them? Referral for which specialty: Mammography Preferred provider/office: [facility] Reason for referral: Pt is requesting to have an order sent over. She is needing it before 01/19/20. Pt states that she is having pain in her breast. Please advise.

## 2020-01-08 ENCOUNTER — Encounter: Payer: Self-pay | Admitting: Family Medicine

## 2020-01-08 ENCOUNTER — Ambulatory Visit (INDEPENDENT_AMBULATORY_CARE_PROVIDER_SITE_OTHER): Payer: Medicare Other | Admitting: Family Medicine

## 2020-01-08 ENCOUNTER — Other Ambulatory Visit: Payer: Self-pay

## 2020-01-08 VITALS — BP 135/82 | HR 106 | Temp 96.1°F | Wt 230.0 lb

## 2020-01-08 DIAGNOSIS — L539 Erythematous condition, unspecified: Secondary | ICD-10-CM

## 2020-01-08 DIAGNOSIS — N644 Mastodynia: Secondary | ICD-10-CM

## 2020-01-08 NOTE — Patient Instructions (Signed)
Breast Tenderness Breast tenderness is a common problem for women of all ages, but may also occur in men. Breast tenderness may range from mild discomfort to severe pain. In women, the pain usually comes and goes with the menstrual cycle, but it can also be constant. Breast tenderness has many possible causes, including hormone changes, infections, and taking certain medicines. You may have tests, such as a mammogram or an ultrasound, to check for any unusual findings. Having breast tenderness usually does not mean that you have breast cancer. Follow these instructions at home: Managing pain and discomfort   If directed, put ice to the painful area. To do this: ? Put ice in a plastic bag. ? Place a towel between your skin and the bag. ? Leave the ice on for 20 minutes, 2-3 times a day.  Wear a supportive bra, especially during exercise. You may also want to wear a supportive bra while sleeping if your breasts are very tender. Medicines  Take over-the-counter and prescription medicines only as told by your health care provider. If the cause of your pain is infection, you may be prescribed an antibiotic medicine.  If you were prescribed an antibiotic, take it as told by your health care provider. Do not stop taking the antibiotic even if you start to feel better. Eating and drinking  Your health care provider may recommend that you lessen the amount of fat in your diet. You can do this by: ? Limiting fried foods. ? Cooking foods using methods such as baking, boiling, grilling, and broiling.  Decrease the amount of caffeine in your diet. Instead, drink more water and choose caffeine-free drinks. General instructions   Keep a log of the days and times when your breasts are most tender.  Ask your health care provider how to do breast exams at home. This will help you notice if you have an unusual growth or lump.  Keep all follow-up visits as told by your health care provider. This is  important. Contact a health care provider if:  Any part of your breast is hard, red, and hot to the touch. This may be a sign of infection.  You are a woman and: ? Not breastfeeding and you have fluid, especially blood or pus, coming out of your nipples. ? Have a new or painful lump in your breast that remains after your menstrual period ends.  You have a fever.  Your pain does not improve or it gets worse.  Your pain is interfering with your daily activities. Summary  Breast tenderness may range from mild discomfort to severe pain.  Breast tenderness has many possible causes, including hormone changes, infections, and taking certain medicines.  It can be treated with ice, wearing a supportive bra, and medicines.  Make changes to your diet if told to by your health care provider. This information is not intended to replace advice given to you by your health care provider. Make sure you discuss any questions you have with your health care provider. Document Revised: 03/02/2019 Document Reviewed: 03/02/2019 Elsevier Patient Education  2020 Elsevier Inc.  

## 2020-01-08 NOTE — Progress Notes (Signed)
Patient: Kim Buchanan Female    DOB: [birth year]   69 y.o.   MRN: [id number] Visit Date: 01/08/2020  Today's Provider: Lavon Paganini, MD   Chief Complaint  Patient presents with  . Breast Pain    Started yesterday   Subjective:     HPI   Pt comes in today complaining of right breast pain.  Pt states she has had right breast pain before but usually goes away in its own. Yesterday was the worst pain and is still not better.  Pt's last Mammogram was 05/21/2014.  No fevers, myalgias, palpable lumps, nipple discharge  Allergies  Allergen Reactions  . Lisinopril Cough     Current Outpatient Medications:  .  aspirin 81 MG tablet, Take 81 mg by mouth daily. , Disp: , Rfl:  .  atorvastatin (LIPITOR) 20 MG tablet, TAKE 1 TABLET BY MOUTH AT  BEDTIME, Disp: 90 tablet, Rfl: 3 .  DULoxetine (CYMBALTA) 30 MG capsule, Take 1 capsule (30 mg total) by mouth daily., Disp: 30 capsule, Rfl: 3 .  isosorbide mononitrate (IMDUR) 30 MG 24 hr tablet, TAKE 1 TABLET BY MOUTH  DAILY, Disp: 90 tablet, Rfl: 3 .  pantoprazole (PROTONIX) 40 MG tablet, Take 40 mg by mouth daily. , Disp: , Rfl:  .  polyethylene glycol powder (GLYCOLAX/MIRALAX) 17 GM/SCOOP powder, Take 1-2 doses daily as needed for constipation/to prevent constipation, Disp: , Rfl:  .  traZODone (DESYREL) 150 MG tablet, Take 1 tablet (150 mg total) by mouth at bedtime as needed. for sleep, Disp: 90 tablet, Rfl: 3  Review of Systems  Constitutional: Negative.   Respiratory: Negative.   Cardiovascular: Negative.   Gastrointestinal: Negative.   Neurological: Negative for dizziness, light-headedness and headaches.    Social History   Tobacco Use  . Smoking status: Former Smoker    Packs/day: 0.50    Years: 47.00    Pack years: 23.50    Types: Cigarettes    Quit date: 11/29/2018    Years since quitting: 1.1  . Smokeless tobacco: Never Used  . Tobacco comment: started at age 35  Substance Use Topics  . Alcohol use: No   Alcohol/week: 0.0 standard drinks      Objective:   BP 135/82 (BP Location: Left Wrist, Patient Position: Sitting, Cuff Size: Normal)   Pulse (!) 106   Temp (!) 96.1 F (35.6 C) (Temporal)   Wt 230 lb (104.3 kg)   BMI 40.74 kg/m  Vitals:   01/08/20 1411  BP: 135/82  Pulse: (!) 106  Temp: (!) 96.1 F (35.6 C)  TempSrc: Temporal  Weight: 230 lb (104.3 kg)  Body mass index is 40.74 kg/m.   Physical Exam Vitals reviewed.  Constitutional:      General: She is not in acute distress.    Appearance: She is well-developed.  HENT:     Head: Normocephalic and atraumatic.  Eyes:     General: No scleral icterus.    Conjunctiva/sclera: Conjunctivae normal.  Cardiovascular:     Rate and Rhythm: Normal rate and regular rhythm.  Pulmonary:     Effort: Pulmonary effort is normal. No respiratory distress.  Genitourinary:    Comments: Breasts: R breast with erythema of upper outer quadrant with TTP around nipple, no suspicious masses, nipple changes or axillary nodes, left breast normal without mass, skin or nipple changes or axillary nodes, TTP inferior to nipple of L breast. Skin:    General: Skin is warm and dry.  Capillary Refill: Capillary refill takes less than 2 seconds.     Findings: No rash.  Neurological:     Mental Status: She is alert and oriented to person, place, and time.  Psychiatric:        Behavior: Behavior normal.      No results found for any visits on 01/08/20.     Assessment & Plan    1. Mastalgia 2. Breast erythema - new problem - no recent screening mammogram/breast cancer screening - no concerning masses palpable, but there is erythema of upper outer quadrant of R breat - needs b/l diagnostic mammo and Breast US - ice, nsaids, conservative measures in the meantime - US BREAST LTD UNI RIGHT INC AXILLA; Future - US BREAST LTD UNI LEFT INC AXILLA; Future - MM DIAG BREAST TOMO BILATERAL; Future   Return if symptoms worsen or fail to  improve.   The entirety of the information documented in the History of Present Illness, Review of Systems and Physical Exam were personally obtained by me. Portions of this information were initially documented by Ashley Royalty, CMA and reviewed by me for thoroughness and accuracy.    Yuki Purves, Dionne Bucy, MD MPH [facility]

## 2020-01-18 ENCOUNTER — Telehealth: Payer: Self-pay

## 2020-01-18 ENCOUNTER — Ambulatory Visit
Admission: RE | Admit: 2020-01-18 | Discharge: 2020-01-18 | Disposition: A | Discharge status: Home / Self Care | Payer: Medicare Other | Source: Ambulatory Visit | Attending: Family Medicine | Admitting: Family Medicine

## 2020-01-18 DIAGNOSIS — L539 Erythematous condition, unspecified: Secondary | ICD-10-CM

## 2020-01-18 DIAGNOSIS — N644 Mastodynia: Secondary | ICD-10-CM | POA: Diagnosis not present

## 2020-01-18 DIAGNOSIS — N6489 Other specified disorders of breast: Secondary | ICD-10-CM | POA: Diagnosis not present

## 2020-01-18 DIAGNOSIS — R928 Other abnormal and inconclusive findings on diagnostic imaging of breast: Secondary | ICD-10-CM | POA: Diagnosis not present

## 2020-01-18 NOTE — Telephone Encounter (Signed)
-----   Message from Virginia Crews, MD sent at 01/18/2020  4:06 PM EDT ----- Normal breast ultrasound and mammogram.

## 2020-01-18 NOTE — Telephone Encounter (Signed)
Pt advised.   Thanks,   -Ayasha Ellingsen  

## 2020-02-02 ENCOUNTER — Ambulatory Visit: Payer: Medicare Other | Admitting: Family Medicine

## 2020-02-23 ENCOUNTER — Ambulatory Visit: Payer: Medicare Other | Admitting: Pulmonary Disease

## 2020-02-26 NOTE — Progress Notes (Deleted)
Established patient visit   Patient: Kim Buchanan   DOB: 07/02/1951   69 y.o. Female  MRN: [id number] Visit Date: 02/29/2020  Today's healthcare provider: Lavon Paganini, MD   No chief complaint on file.  Subjective    HPI Diabetes Mellitus Type II, Follow-up  Lab Results  Component Value Date   HGBA1C 6.6 (H) 11/30/2019   HGBA1C 5.4 01/01/2018   Last seen for diabetes 3 months ago.  Management since then includes no changes medication was not started. She reports {excellent/good/fair/poor:19665} compliance with treatment. She {is/is not:21021397} having side effects. {document side effects if present:1} Symptoms: {Yes/No:20286} fatigue {Yes/No:20286} foot ulcerations {Yes/No:20286} appetite changes {Yes/No:20286} nausea {Yes/No:20286} paresthesia (numbness or tingling) of the feet  {Yes/No:20286} polydipsia (excessive thirst) {Yes/No:20286} polyuria (frequent urination) {Yes/No:20286} visual disturbances  {Yes/No:20286} vomiting  Home blood sugar records: {diabetes glucometry results:16657}  Episodes of hypoglycemia? {Yes/No:20286} {enter symptoms and frequency of symptoms if yes:1}   Current insulin regiment: {enter 'none' or type of insulin and number of units taken with each dose of each insulin formulation that the patient is taking:1} Most Recent Eye Exam: *** {Current exercise:16438:::1} {Current diet habits:16563:::1}  Pertinent Labs: Lab Results  Component Value Date   CHOL 66 (L) 11/30/2019   HDL 43 11/30/2019   LDLCALC 7 11/30/2019   TRIG 69 11/30/2019   CHOLHDL 1.5 11/30/2019   Lab Results  Component Value Date   NA 144 11/30/2019   K 4.6 11/30/2019   CO2 19 (L) 11/30/2019   GLUCOSE 101 (H) 11/30/2019   BUN 16 11/30/2019   CREATININE 0.93 11/30/2019   CALCIUM 9.0 11/30/2019   GFRNONAA 64 11/30/2019   GFRAA 74 11/30/2019     Wt Readings from Last 3 Encounters:  01/08/20 230 lb (104.3 kg)  12/11/19 230 lb (104.3 kg)  11/30/19  233 lb (105.7 kg)    --------------------------------------------------------------------------------------------------- Depression/Anxiety, Follow-up  She  was last seen for this 3 months ago. Changes made at last visit include start Cymbalta 30 mg.   She reports {excellent/good/fair/poor:19665} compliance with treatment. She {is/is not:21021397} having side effects. ***  She reports {DESC; GOOD/FAIR/POOR:18685} tolerance of treatment. Current symptoms include: {Symptoms; depression:1002} She feels she is {improved/worse/unchanged:3041574} since last visit.  Depression screen [facility] 2/9 11/30/2019 08/06/2019 06/05/2019  Decreased Interest 1 3 3   Down, Depressed, Hopeless 2 3 3   PHQ - 2 Score 3 6 6   Altered sleeping 0 3 2  Tired, decreased energy 3 3 3   Change in appetite 3 3 3   Feeling bad or failure about yourself  3 3 0  Trouble concentrating 0 0 3  Moving slowly or fidgety/restless 0 0 3  Suicidal thoughts 0 0 0  PHQ-9 Score 12 18 20   Difficult doing work/chores Not difficult at all Somewhat difficult Somewhat difficult  Some recent data might be hidden    GAD 7 : Generalized Anxiety Score 11/30/2019 08/06/2019 06/05/2019 03/13/2018  Nervous, Anxious, on Edge 3 1 2 3   Control/stop worrying 3 1 2 3   Worry too much - different things 3 3 3 3   Trouble relaxing 1 3 3 3   Restless 0 0 0 3  Easily annoyed or irritable 3 0 2 3  Afraid - awful might happen 0 3 3 3   Total GAD 7 Score 13 11 15 21   Anxiety Difficulty - Somewhat difficult Somewhat difficult Somewhat difficult   ----------------------------------------------------------------------------------------- Hypertension, follow-up  BP Readings from Last 3 Encounters:  01/08/20 135/82  12/11/19 132/74  11/30/19 116/60  Wt Readings from Last 3 Encounters:  01/08/20 230 lb (104.3 kg)  12/11/19 230 lb (104.3 kg)  11/30/19 233 lb (105.7 kg)     She was last seen for hypertension 3 months ago.  BP at that visit was 116/60.  Management since that visit includes no changes, continue current medication.  She reports {excellent/good/fair/poor:19665} compliance with treatment. She {is/is not:9024} having side effects. {document side effects if present:1} She is following a {diet:21022986} diet. She {is/is not:9024} exercising. She {does/does not:200015} smoke.  Use of agents associated with hypertension: {bp agents assoc with hypertension:511::"none"}.   Outside blood pressures are {***enter patient reported home BP readings, or 'not being checked':1}. Symptoms: {Yes/No:20286} chest pain {Yes/No:20286} chest pressure {Yes/No:20286} palpitations {Yes/No:20286} dyspnea {Yes/No:20286} orthopnea {Yes/No:20286} paroxysmal nocturnal dyspnea {Yes/No:20286} lower extremity edema {Yes/No:20286} syncope   Pertinent labs: Lab Results  Component Value Date   CHOL 66 (L) 11/30/2019   HDL 43 11/30/2019   LDLCALC 7 11/30/2019   TRIG 69 11/30/2019   CHOLHDL 1.5 11/30/2019   Lab Results  Component Value Date   NA 144 11/30/2019   K 4.6 11/30/2019   CO2 19 (L) 11/30/2019   GLUCOSE 101 (H) 11/30/2019   BUN 16 11/30/2019   CREATININE 0.93 11/30/2019   CALCIUM 9.0 11/30/2019   GFRNONAA 64 11/30/2019   GFRAA 74 11/30/2019     The ASCVD Risk score (Goff DC Jr., et al., [name]   ---------------------------------------------------------------------------------------------------   {Show patient history (optional):23778::" "}   Medications: Outpatient Medications Prior to Visit  Medication Sig  . aspirin 81 MG tablet Take 81 mg by mouth daily.   Marland Kitchen atorvastatin (LIPITOR) 20 MG tablet TAKE 1 TABLET BY MOUTH AT  BEDTIME  . DULoxetine (CYMBALTA) 30 MG capsule Take 1 capsule (30 mg total) by mouth daily.  . isosorbide mononitrate (IMDUR) 30 MG 24 hr tablet TAKE 1 TABLET BY MOUTH  DAILY  . pantoprazole (PROTONIX) 40  MG tablet Take 40 mg by mouth daily.   . polyethylene glycol powder (GLYCOLAX/MIRALAX) 17 GM/SCOOP powder Take 1-2 doses daily as needed for constipation/to prevent constipation  . traZODone (DESYREL) 150 MG tablet Take 1 tablet (150 mg total) by mouth at bedtime as needed. for sleep   No facility-administered medications prior to visit.    Review of Systems  {Show previous labs (optional):23779::" "}  Objective    There were no vitals taken for this visit. {Show previous vital signs (optional):23777::" "}  Physical Exam  ***  No results found for any visits on 02/29/20.  Assessment & Plan     ***  No follow-ups on file.      {provider attestation***:1}   Lavon Paganini, MD  [facility] [phone] (phone) [phone] (fax)  [place]

## 2020-02-29 ENCOUNTER — Ambulatory Visit: Payer: Medicare Other | Admitting: Family Medicine

## 2020-03-01 NOTE — Progress Notes (Signed)
[name],acting as a scribe for Lavon Paganini, MD.,have documented all relevant documentation on the behalf of Lavon Paganini, MD,as directed by  Lavon Paganini, MD while in the presence of Lavon Paganini, MD.  Established patient visit   Patient: Kim Buchanan   DOB: 10-Sep-1951   69 y.o. Female  MRN: [id number] Visit Date: 03/02/2020  Today's healthcare provider: Lavon Paganini, MD   Chief Complaint  Patient presents with  . Diabetes Mellitus  . Hyperlipidemia   Subjective    HPI Prediabetes, Follow-up  Lab Results  Component Value Date   HGBA1C 6.6 (H) 11/30/2019   HGBA1C 5.4 01/01/2018   GLUCOSE 101 (H) 11/30/2019   GLUCOSE 189 (H) 09/08/2019   GLUCOSE 116 (H) 08/14/2019    Last seen for for this3 months ago.  Management since that visit includes lifestyle interventions. Current symptoms include increase appetite, nausea and polyuria and have been stable.  Prior visit with dietician: no Current diet: in general, an "unhealthy" diet Current exercise: none  Pertinent Labs:    Component Value Date/Time   CHOL 66 (L) 11/30/2019 1135   TRIG 69 11/30/2019 1135   CHOLHDL 1.5 11/30/2019 1135   CHOLHDL 1.7 01/01/2018 0000   CREATININE 0.93 11/30/2019 1135   CREATININE 0.82 01/01/2018 0000    Wt Readings from Last 3 Encounters:  03/02/20 236 lb (107 kg)  01/08/20 230 lb (104.3 kg)  12/11/19 230 lb (104.3 kg)    -----------------------------------------------------------------------------------------  Lipid/Cholesterol, Follow-up  Last lipid panel Other pertinent labs  Lab Results  Component Value Date   CHOL 66 (L) 11/30/2019   HDL 43 11/30/2019   LDLCALC 7 11/30/2019   TRIG 69 11/30/2019   CHOLHDL 1.5 11/30/2019   Lab Results  Component Value Date   ALT 48 (H) 11/30/2019   AST 27 11/30/2019   PLT 306 11/30/2019   TSH 2.627 08/14/2019     She was last seen for this 3 months ago.  Management since that visit  includes decrease atorvastatin to 10 mg daily.  She reports excellent compliance with treatment. She is not having side effects.   Symptoms: No chest pain No chest pressure/discomfort  No dyspnea No lower extremity edema  No numbness or tingling of extremity No orthopnea  No palpitations No paroxysmal nocturnal dyspnea  No speech difficulty No syncope   Current diet: in general, an "unhealthy" diet Current exercise: none  The ASCVD Risk score ([place]., et al., [name]  ---------------------------------------------------------------------------------------------------   Social History   Tobacco Use  . Smoking status: Former Smoker    Packs/day: 0.50    Years: 47.00    Pack years: 23.50    Types: Cigarettes    Quit date: 11/29/2018    Years since quitting: 1.2  . Smokeless tobacco: Never Used  . Tobacco comment: started at age 68  Substance Use Topics  . Alcohol use: No    Alcohol/week: 0.0 standard drinks  . Drug use: No       Medications: Outpatient Medications Prior to Visit  Medication Sig  . aspirin 81 MG tablet Take 81 mg by mouth daily.   Marland Kitchen atorvastatin (LIPITOR) 20 MG tablet TAKE 1 TABLET BY MOUTH AT  BEDTIME  . isosorbide mononitrate (IMDUR) 30 MG 24 hr tablet TAKE 1 TABLET BY MOUTH  DAILY  . pantoprazole (PROTONIX) 40 MG tablet Take 40 mg by  mouth daily.   . polyethylene glycol powder (GLYCOLAX/MIRALAX) 17 GM/SCOOP powder Take 1-2 doses daily as needed for constipation/to prevent constipation  . traZODone (DESYREL) 150 MG tablet Take 1 tablet (150 mg total) by mouth at bedtime as needed. for sleep  . [DISCONTINUED] DULoxetine (CYMBALTA) 30 MG capsule Take 1 capsule (30 mg total) by mouth daily. (Patient not taking: Reported on 03/02/2020)   No facility-administered medications prior to visit.    Review of Systems  Last metabolic panel Lab Results    Component Value Date   GLUCOSE 101 (H) 11/30/2019   NA 144 11/30/2019   K 4.6 11/30/2019   CL 107 (H) 11/30/2019   CO2 19 (L) 11/30/2019   BUN 16 11/30/2019   CREATININE 0.93 11/30/2019   GFRNONAA 64 11/30/2019   GFRAA 74 11/30/2019   CALCIUM 9.0 11/30/2019   PHOS 4.1 01/06/2018   PROT 6.7 11/30/2019   ALBUMIN 4.0 11/30/2019   LABGLOB 2.7 11/30/2019   AGRATIO 1.5 11/30/2019   BILITOT 0.4 11/30/2019   ALKPHOS 96 11/30/2019   AST 27 11/30/2019   ALT 48 (H) 11/30/2019   ANIONGAP 9 09/08/2019   Last lipids Lab Results  Component Value Date   CHOL 66 (L) 11/30/2019   HDL 43 11/30/2019   LDLCALC 7 11/30/2019   TRIG 69 11/30/2019   CHOLHDL 1.5 11/30/2019   Last hemoglobin A1c Lab Results  Component Value Date   HGBA1C 6.6 (H) 11/30/2019      Objective    BP 119/79 (BP Location: Left Arm, Patient Position: Sitting, Cuff Size: Large)   Pulse 98   Temp (!) 96.8 F (36 C) (Temporal)   Wt 236 lb (107 kg)   BMI 41.81 kg/m  BP Readings from Last 3 Encounters:  03/02/20 119/79  01/08/20 135/82  12/11/19 132/74   Wt Readings from Last 3 Encounters:  03/02/20 236 lb (107 kg)  01/08/20 230 lb (104.3 kg)  12/11/19 230 lb (104.3 kg)      Physical Exam Vitals reviewed.  Constitutional:      General: She is not in acute distress.    Appearance: Normal appearance. She is not diaphoretic.  HENT:     Head: Normocephalic and atraumatic.  Eyes:     Conjunctiva/sclera: Conjunctivae normal.  Cardiovascular:     Rate and Rhythm: Normal rate and regular rhythm.     Pulses: Normal pulses.     Heart sounds: Normal heart sounds.  Pulmonary:     Effort: Pulmonary effort is normal.     Breath sounds: Normal breath sounds.  Musculoskeletal:     Right lower leg: No edema.     Left lower leg: No edema.  Skin:    Findings: No rash.  Neurological:     Mental Status: She is alert and oriented to person, place, and time. Mental status is at baseline.  Psychiatric:        Mood  and Affect: Mood normal.        Judgment: Judgment normal.     Diabetic Foot Exam - Simple   Simple Foot Form Diabetic Foot exam was performed with the following findings: Yes 03/02/2020 10:21 AM  Visual Inspection No deformities, no ulcerations, no other skin breakdown bilaterally: Yes Sensation Testing Intact to touch and monofilament testing bilaterally: Yes Pulse Check Posterior Tibialis and Dorsalis pulse intact bilaterally: Yes Comments      No results found for any visits on 03/02/20.  Assessment & Plan     Problem List Items  Addressed This Visit      Cardiovascular and Mediastinum   Hypertension    Well controlled Continue current medications Recheck metabolic panel F/u in 3 months        Endocrine   Diabetes mellitus ([place]) - Primary    Started patient on Metformin 500 MG  Recent A1c of 7.2 - uncontrolled Complicated by HTN and HLD Foot exam normal Check urine microalbumin at next visit  UTD on vaccines We will send off for her last eye exam, she is followed regularly by Dr. Ellin Mayhew  At follow-up in about 3 months, recheck A1c       Relevant Medications   metFORMIN (GLUCOPHAGE) 500 MG tablet   Other Relevant Orders   POCT glycosylated hemoglobin (Hb A1C)     Other   MDD (major depressive disorder)    Chronic and uncontrolled Increased Cymbalta from 30 mg daily to 60 mg Follow up in 3 months      Relevant Medications   DULoxetine (CYMBALTA) 60 MG capsule   Morbid obesity ([place])    Discussed importance of healthy weight management Discussed diet and exercise      Relevant Medications   metFORMIN (GLUCOPHAGE) 500 MG tablet    Other Visit Diagnoses    Postmenopausal       Relevant Orders   HM DEXA SCAN (Completed)      Return in about 3 months (around 06/02/2020).      I, Lavon Paganini, MD, have reviewed all documentation for this visit. The documentation on 03/02/20 for the exam, diagnosis, procedures, and orders are all accurate and  complete.   Antwaine Boomhower, Dionne Bucy, MD, MPH [facility]

## 2020-03-02 ENCOUNTER — Ambulatory Visit (INDEPENDENT_AMBULATORY_CARE_PROVIDER_SITE_OTHER): Payer: Medicare Other | Admitting: Family Medicine

## 2020-03-02 ENCOUNTER — Encounter: Payer: Self-pay | Admitting: Family Medicine

## 2020-03-02 ENCOUNTER — Other Ambulatory Visit: Payer: Self-pay

## 2020-03-02 VITALS — BP 119/79 | HR 98 | Temp 96.8°F | Wt 236.0 lb

## 2020-03-02 DIAGNOSIS — Z780 Asymptomatic menopausal state: Secondary | ICD-10-CM | POA: Diagnosis not present

## 2020-03-02 DIAGNOSIS — I10 Essential (primary) hypertension: Secondary | ICD-10-CM

## 2020-03-02 DIAGNOSIS — E1169 Type 2 diabetes mellitus with other specified complication: Secondary | ICD-10-CM

## 2020-03-02 DIAGNOSIS — F331 Major depressive disorder, recurrent, moderate: Secondary | ICD-10-CM

## 2020-03-02 LAB — POCT GLYCOSYLATED HEMOGLOBIN (HGB A1C)
Estimated Average Glucose: 160
Hemoglobin A1C: 7.2 % — AB (ref 4.0–5.6)

## 2020-03-02 MED ORDER — METFORMIN HCL 500 MG PO TABS: 500.0000 mg | ORAL_TABLET | Freq: Two times a day (BID) | ORAL | 3 refills | Status: DC

## 2020-03-02 MED ORDER — DULOXETINE HCL 60 MG PO CPEP: 60.0000 mg | ORAL_CAPSULE | Freq: Every day | ORAL | 3 refills | Status: DC

## 2020-03-02 NOTE — Assessment & Plan Note (Signed)
Discussed importance of healthy weight management Discussed diet and exercise  

## 2020-03-02 NOTE — Assessment & Plan Note (Addendum)
Started patient on Metformin 500 MG  Recent A1c of 7.2 - uncontrolled Complicated by HTN and HLD Foot exam normal Check urine microalbumin at next visit  UTD on vaccines We will send off for her last eye exam, she is followed regularly by Dr. Ellin Mayhew  At follow-up in about 3 months, recheck A1c

## 2020-03-02 NOTE — Assessment & Plan Note (Signed)
Chronic and uncontrolled Increased Cymbalta from 30 mg daily to 60 mg Follow up in 3 months

## 2020-03-02 NOTE — Assessment & Plan Note (Signed)
Well controlled Continue current medications Recheck metabolic panel F/u in 3 months  

## 2020-03-04 ENCOUNTER — Telehealth: Payer: Self-pay | Admitting: Family Medicine

## 2020-03-04 DIAGNOSIS — E1169 Type 2 diabetes mellitus with other specified complication: Secondary | ICD-10-CM

## 2020-03-04 MED ORDER — BLOOD GLUCOSE METER KIT: PACK | 0 refills | Status: DC

## 2020-03-04 NOTE — Telephone Encounter (Signed)
Ok to send in [place] meter, strips, and lancets. Can check once daily

## 2020-03-04 NOTE — Telephone Encounter (Signed)
Patient is requesting new Rx for diabetic testing- meter and supplies.

## 2020-03-04 NOTE — Telephone Encounter (Signed)
Pt called requesting to have a prescription for a meter sent in for her. Pt states that her husband has a one touch, but that she is not able to use it to get her numbers. Please advise.     [place], [place] - [address]  [place] [address]  Phone: [phone] Fax: [phone]  Not a 24 hour pharmacy; exact hours not known.

## 2020-03-07 ENCOUNTER — Telehealth: Payer: Self-pay | Admitting: *Deleted

## 2020-03-07 ENCOUNTER — Ambulatory Visit: Payer: Self-pay | Admitting: *Deleted

## 2020-03-07 DIAGNOSIS — E1169 Type 2 diabetes mellitus with other specified complication: Secondary | ICD-10-CM

## 2020-03-07 DIAGNOSIS — M7062 Trochanteric bursitis, left hip: Secondary | ICD-10-CM | POA: Diagnosis not present

## 2020-03-07 NOTE — Telephone Encounter (Signed)
Patient is requesting a new glucose kit to use to check her glucose levels. Please send to [facility]- ( Please send something different than One Touch).

## 2020-03-07 NOTE — Telephone Encounter (Signed)
Patient is complaining of hip pain down to knee- left.Patient states she woke with the pain this morning. Patient reports it feels like joint pain- but also states she is having thigh pain. Nothing has really helped with her pain today. Advised UC for evaluation- no open appointment at office   Reason for Disposition . [1] Thigh or calf pain AND [2] only 1 side AND [3] present > 1 hour  Answer Assessment - Initial Assessment Questions 1. ONSET: "When did the pain start?"      Today- this morning 2. LOCATION: "Where is the pain located?"      Left- hip to knee 3. PAIN: "How bad is the pain?"    (Scale 1-10; or mild, moderate, severe)   -  MILD (1-3): doesn't interfere with normal activities    -  MODERATE (4-7): interferes with normal activities (e.g., work or school) or awakens from sleep, limping    -  SEVERE (8-10): excruciating pain, unable to do any normal activities, unable to walk     moderate 4. WORK OR EXERCISE: "Has there been any recent work or exercise that involved this part of the body?"      no 5. CAUSE: "What do you think is causing the leg pain?"     Unsure-"feels heavy" 6. OTHER SYMPTOMS: "Do you have any other symptoms?" (e.g., chest pain, back pain, breathing difficulty, swelling, rash, fever, numbness, weakness)     no 7. PREGNANCY: "Is there any chance you are pregnant?" "When was your last menstrual period?"     n/a  Protocols used: LEG PAIN-A-AH

## 2020-03-08 MED ORDER — BLOOD GLUCOSE METER KIT: PACK | 0 refills | Status: DC

## 2020-03-08 NOTE — Telephone Encounter (Signed)
Glucose meter kit and supplies called into pharmacy. 

## 2020-03-08 NOTE — Telephone Encounter (Signed)
Patient is calling again to check the status of her request for a glucose kit.  She stated that the pharmacy does not have the order.  Please call patient to explain at [phone]

## 2020-03-08 NOTE — Addendum Note (Signed)
Addended by: Shawna Orleans on: 03/08/2020 02:40 PM   Modules accepted: Orders

## 2020-03-08 NOTE — Telephone Encounter (Signed)
Patient is calling to check the status of her glucose kit.  Stated that the pharmacy said they do not have an order.  Please call patient to discuss at [phone] 

## 2020-03-11 ENCOUNTER — Other Ambulatory Visit: Payer: Self-pay

## 2020-03-11 ENCOUNTER — Encounter: Payer: Self-pay | Admitting: Family Medicine

## 2020-03-11 ENCOUNTER — Ambulatory Visit (INDEPENDENT_AMBULATORY_CARE_PROVIDER_SITE_OTHER): Payer: Medicare Other | Admitting: Family Medicine

## 2020-03-11 VITALS — BP 151/90 | HR 83 | Temp 97.4°F | Ht 62.0 in | Wt 230.0 lb

## 2020-03-11 DIAGNOSIS — Z7689 Persons encountering health services in other specified circumstances: Secondary | ICD-10-CM | POA: Diagnosis not present

## 2020-03-11 DIAGNOSIS — M25552 Pain in left hip: Secondary | ICD-10-CM | POA: Diagnosis not present

## 2020-03-11 DIAGNOSIS — M545 Low back pain, unspecified: Secondary | ICD-10-CM

## 2020-03-11 DIAGNOSIS — E1169 Type 2 diabetes mellitus with other specified complication: Secondary | ICD-10-CM

## 2020-03-11 DIAGNOSIS — G4700 Insomnia, unspecified: Secondary | ICD-10-CM

## 2020-03-11 DIAGNOSIS — J410 Simple chronic bronchitis: Secondary | ICD-10-CM

## 2020-03-11 DIAGNOSIS — I25118 Atherosclerotic heart disease of native coronary artery with other forms of angina pectoris: Secondary | ICD-10-CM

## 2020-03-11 DIAGNOSIS — I10 Essential (primary) hypertension: Secondary | ICD-10-CM

## 2020-03-11 DIAGNOSIS — G8929 Other chronic pain: Secondary | ICD-10-CM

## 2020-03-11 DIAGNOSIS — E785 Hyperlipidemia, unspecified: Secondary | ICD-10-CM

## 2020-03-11 DIAGNOSIS — G6289 Other specified polyneuropathies: Secondary | ICD-10-CM

## 2020-03-11 MED ORDER — GABAPENTIN 300 MG PO CAPS: 300.0000 mg | ORAL_CAPSULE | Freq: Three times a day (TID) | ORAL | 0 refills | Status: DC

## 2020-03-11 MED ORDER — QUETIAPINE FUMARATE 25 MG PO TABS: 25.0000 mg | ORAL_TABLET | Freq: Every day | ORAL | 0 refills | Status: DC

## 2020-03-11 NOTE — Progress Notes (Signed)
BP (!) 151/90   Pulse 83   Temp (!) 97.4 F (36.3 C) (Oral)   Ht 5\' 2"  (1.575 m)   Wt 230 lb (104.3 kg)   SpO2 95%   BMI 42.07 kg/m    Subjective:    Patient ID: Kim Buchanan, female    DOB: 1951-08-31, 69 y.o.   MRN: [id number]  HPI: Kim Buchanan is a 69 y.o. female  Chief Complaint  Patient presents with  . Establish Care   Here today to establish care.   HTN, CAD, NICM - Home BP readings running 120/70. On imdur, tolerating well without recent angina, palpitations, syncope. Takes lipitor for cholesterol mgmt, tolerating well without myalgias.   Insomnia - currently on trazodone but notes this doesn't seem to help much. Husband used to take [place] and she wishes to try this.   Not taking cymbalta any longer, was taking it for nerve pain but it hadn't been helping. Low back and left hip chronic pain, no radiation down leg. No known injury.   COPD - Stable without recent exacerbations, not currently on inhalers.   DM - on metformin, recent A1C under good control at 6.6 11/2019. Tolerating well, no side effects. Does not follow strict diet or exercise regularly.   Relevant past medical, surgical, family and social history reviewed and updated as indicated. Interim medical history since our last visit reviewed. Allergies and medications reviewed and updated.  Review of Systems  Per HPI unless specifically indicated above     Objective:    BP (!) 151/90   Pulse 83   Temp (!) 97.4 F (36.3 C) (Oral)   Ht 5\' 2"  (1.575 m)   Wt 230 lb (104.3 kg)   SpO2 95%   BMI 42.07 kg/m   Wt Readings from Last 3 Encounters:  03/11/20 230 lb (104.3 kg)  03/02/20 236 lb (107 kg)  01/08/20 230 lb (104.3 kg)    Physical Exam Vitals and nursing note reviewed.  Constitutional:      Appearance: Normal appearance. She is not ill-appearing.  HENT:     Head: Atraumatic.  Eyes:     Extraocular Movements: Extraocular movements intact.     Conjunctiva/sclera: Conjunctivae  normal.  Cardiovascular:     Rate and Rhythm: Normal rate and regular rhythm.     Heart sounds: Normal heart sounds.  Pulmonary:     Effort: Pulmonary effort is normal.     Breath sounds: Normal breath sounds.  Musculoskeletal:        General: Normal range of motion.     Cervical back: Normal range of motion and neck supple.  Skin:    General: Skin is warm and dry.  Neurological:     Mental Status: She is alert and oriented to person, place, and time.  Psychiatric:        Mood and Affect: Mood normal.        Thought Content: Thought content normal.        Judgment: Judgment normal.     Results for orders placed or performed in visit on 03/02/20  POCT glycosylated hemoglobin (Hb A1C)  Result Value Ref Range   Hemoglobin A1C 7.2 (A) 4.0 - 5.6 %   Estimated Average Glucose 160       Assessment & Plan:   Problem List Items Addressed This Visit      Cardiovascular and Mediastinum   Hypertension    BP mildly elevated today but typically WNL, continue to monitor on  current regimen      CAD (coronary artery disease), native coronary artery    Stable, continue current reigmen        Respiratory   COPD (chronic obstructive pulmonary disease) (HCC)    Stable off inhalers, continue to monitor        Endocrine   Hyperlipidemia associated with type 2 diabetes mellitus ([place])    Stable, continue current regimen and lifestyle changes      Diabetes mellitus ([place])    Stable and well controlled, continue current regimen and recommended good diet and exercise changes        Nervous and Auditory   Peripheral neuropathy    No benefit with cymbalta, trial gabapentin and titrate dose up as needed/tolerated      Relevant Medications   QUEtiapine (SEROQUEL) 25 MG tablet   gabapentin (NEURONTIN) 300 MG capsule     Other   Low back pain - Primary    Will obtain low back and hip imaging given ongoing and worsening pain issues. Discussed supportive care, OTC remedies       Relevant Orders   DG Lumbar Spine Complete (Completed)   Insomnia    D/c trazodone due to no benefit, trial seroquel to help with her sleep and anxiety concerns       Other Visit Diagnoses    Encounter to establish care       Left hip pain       Relevant Orders   DG Hip Unilat W OR W/O Pelvis 2-3 Views Left (Completed)       Follow up plan: Return in about 4 weeks (around 04/08/2020) for Sleep, back pain f/u.

## 2020-03-14 ENCOUNTER — Ambulatory Visit
Admission: RE | Admit: 2020-03-14 | Discharge: 2020-03-14 | Disposition: A | Discharge status: Home / Self Care | Payer: Medicare Other | Source: Ambulatory Visit | Attending: Family Medicine | Admitting: Family Medicine

## 2020-03-14 ENCOUNTER — Other Ambulatory Visit: Payer: Self-pay

## 2020-03-14 DIAGNOSIS — M545 Low back pain, unspecified: Secondary | ICD-10-CM

## 2020-03-14 DIAGNOSIS — G8929 Other chronic pain: Secondary | ICD-10-CM

## 2020-03-14 DIAGNOSIS — M25552 Pain in left hip: Secondary | ICD-10-CM

## 2020-03-14 DIAGNOSIS — M1612 Unilateral primary osteoarthritis, left hip: Secondary | ICD-10-CM | POA: Diagnosis not present

## 2020-03-16 ENCOUNTER — Encounter: Payer: Self-pay | Admitting: Family Medicine

## 2020-03-16 DIAGNOSIS — M1612 Unilateral primary osteoarthritis, left hip: Secondary | ICD-10-CM | POA: Insufficient documentation

## 2020-03-21 DIAGNOSIS — G629 Polyneuropathy, unspecified: Secondary | ICD-10-CM | POA: Insufficient documentation

## 2020-03-21 NOTE — Assessment & Plan Note (Signed)
D/c trazodone due to no benefit, trial seroquel to help with her sleep and anxiety concerns

## 2020-03-21 NOTE — Assessment & Plan Note (Signed)
BP mildly elevated today but typically WNL, continue to monitor on current regimen

## 2020-03-21 NOTE — Assessment & Plan Note (Signed)
No benefit with cymbalta, trial gabapentin and titrate dose up as needed/tolerated

## 2020-03-21 NOTE — Assessment & Plan Note (Signed)
Stable off inhalers, continue to monitor 

## 2020-03-21 NOTE — Assessment & Plan Note (Signed)
Stable, continue current regimen and lifestyle changes

## 2020-03-21 NOTE — Assessment & Plan Note (Signed)
Stable and well controlled, continue current regimen and recommended good diet and exercise changes

## 2020-03-21 NOTE — Assessment & Plan Note (Signed)
Will obtain low back and hip imaging given ongoing and worsening pain issues. Discussed supportive care, OTC remedies

## 2020-03-21 NOTE — Assessment & Plan Note (Signed)
Stable, continue current reigmen

## 2020-03-25 ENCOUNTER — Other Ambulatory Visit: Payer: Self-pay | Admitting: Family Medicine

## 2020-03-25 MED ORDER — QUETIAPINE FUMARATE 50 MG PO TABS: 50.0000 mg | ORAL_TABLET | Freq: Every day | ORAL | 0 refills | Status: DC

## 2020-03-25 NOTE — Telephone Encounter (Signed)
Pt request refill   QUEtiapine (SEROQUEL) 25 MG tablet  Pt reports she is taking 2 tabs and not one as this Rx is written for.  Pt states Apolonio Schneiders told her OK to take 2. Now she is out. Pt requesting refill and enough to last a month. (60 tabs)   [place], [place] [facility] [place] Phone:  [phone]  Fax:  [phone]

## 2020-03-25 NOTE — Telephone Encounter (Signed)
Requested medication (s) are due for refill today: No  Requested medication (s) are on the active medication list: Yes  Last refill:  03/11/20  Future visit scheduled: Yes  Notes to clinic:  Pt. Reports she is taking 2 pills, states PCP told her she could.    Requested Prescriptions  Pending Prescriptions Disp Refills   QUEtiapine (SEROQUEL) 25 MG tablet 30 tablet 0    Sig: Take 1 tablet (25 mg total) by mouth at bedtime.      Not Delegated - Psychiatry:  Antipsychotics - Second Generation (Atypical) - quetiapine Failed - 03/25/2020 10:56 AM      Failed - This refill cannot be delegated      Failed - ALT in normal range and within 180 days    ALT  Date Value Ref Range Status  11/30/2019 48 (H) 0 - 32 IU/L Final   SGPT (ALT)  Date Value Ref Range Status  02/24/2013 94 (H) 12 - 78 U/L Final          Failed - Last BP in normal range    BP Readings from Last 1 Encounters:  03/11/20 (!) 151/90          Passed - AST in normal range and within 180 days    AST  Date Value Ref Range Status  11/30/2019 27 0 - 40 IU/L Final   SGOT(AST)  Date Value Ref Range Status  02/24/2013 58 (H) 15 - 37 Unit/L Final          Passed - Completed PHQ-2 or PHQ-9 in the last 360 days.      Passed - Valid encounter within last 6 months    Recent Outpatient Visits           2 weeks ago Chronic left-sided low back pain without sciatica   [facility] Volney American, [place]   3 weeks ago Type 2 diabetes mellitus with other specified complication, without long-term current use of insulin [facility])   [facility], Dionne Bucy, [place]   2 months ago [place], Dionne Bucy, MD   3 months ago Type 2 diabetes mellitus with other specified complication, without long-term current use of insulin [facility])   [facility], Dionne Bucy, MD   3 months ago Encounter for annual physical exam   [facility], Dionne Bucy, MD       Future Appointments             In 3 weeks Orene Desanctis, Lilia Argue, [place], [facility]

## 2020-03-25 NOTE — Telephone Encounter (Signed)
Seroquel 50 mg tabs ordered earlier today by Merrie Roof, PA. Refusing this request.

## 2020-03-25 NOTE — Telephone Encounter (Signed)
Patient checking on the status of medication refill request. Informed patient please allow 48 to 72 hour turn around time. Patient states she's completely out and would like Rx sent in today and would like a follow up call

## 2020-04-15 ENCOUNTER — Encounter: Payer: Self-pay | Admitting: Family Medicine

## 2020-04-15 ENCOUNTER — Ambulatory Visit (INDEPENDENT_AMBULATORY_CARE_PROVIDER_SITE_OTHER): Payer: Medicare Other | Admitting: Family Medicine

## 2020-04-15 ENCOUNTER — Ambulatory Visit: Payer: Medicare Other | Admitting: Pharmacist

## 2020-04-15 ENCOUNTER — Other Ambulatory Visit: Payer: Self-pay

## 2020-04-15 ENCOUNTER — Telehealth: Payer: Self-pay | Admitting: Family Medicine

## 2020-04-15 VITALS — BP 137/90 | HR 88 | Temp 97.9°F | Wt 232.0 lb

## 2020-04-15 DIAGNOSIS — J410 Simple chronic bronchitis: Secondary | ICD-10-CM

## 2020-04-15 DIAGNOSIS — M5136 Other intervertebral disc degeneration, lumbar region: Secondary | ICD-10-CM

## 2020-04-15 DIAGNOSIS — G4700 Insomnia, unspecified: Secondary | ICD-10-CM

## 2020-04-15 MED ORDER — ALBUTEROL SULFATE HFA 108 (90 BASE) MCG/ACT IN AERS
2.0000 | INHALATION_SPRAY | Freq: Four times a day (QID) | RESPIRATORY_TRACT | 0 refills | Status: DC | PRN
Start: 2020-04-15 — End: 2020-04-15

## 2020-04-15 MED ORDER — QUETIAPINE FUMARATE 100 MG PO TABS
100.0000 mg | ORAL_TABLET | Freq: Every day | ORAL | 0 refills | Status: DC
Start: 2020-04-15 — End: 2020-04-15

## 2020-04-15 MED ORDER — TRELEGY ELLIPTA 100-62.5-25 MCG/INH IN AEPB: 1.0000 | INHALATION_SPRAY | Freq: Every day | RESPIRATORY_TRACT | 2 refills | Status: DC

## 2020-04-15 MED ORDER — ALBUTEROL SULFATE HFA 108 (90 BASE) MCG/ACT IN AERS: 2.0000 | INHALATION_SPRAY | Freq: Four times a day (QID) | RESPIRATORY_TRACT | 2 refills | Status: DC | PRN

## 2020-04-15 MED ORDER — TRELEGY ELLIPTA 100-62.5-25 MCG/INH IN AEPB
1.0000 | INHALATION_SPRAY | Freq: Every day | RESPIRATORY_TRACT | 2 refills | Status: DC
Start: 2020-04-15 — End: 2020-04-15

## 2020-04-15 MED ORDER — QUETIAPINE FUMARATE 100 MG PO TABS: 100.0000 mg | ORAL_TABLET | Freq: Every day | ORAL | 1 refills | Status: DC

## 2020-04-15 NOTE — Progress Notes (Signed)
BP 137/90   Pulse 88   Temp 97.9 F (36.6 C) (Oral)   Wt 232 lb (105.2 kg)   SpO2 97%   BMI 42.43 kg/m    Subjective:    Patient ID: Kim Buchanan, female    DOB: Nov 27, 1950, 69 y.o.   MRN: [id number]  HPI: Kim Buchanan is a 70 y.o. female  Chief Complaint  Patient presents with  . Insomnia  . Back Pain   Presenting today for sleep and back pain f/u. Seroquel does help but wanting to increase dose. Denies side effects with it.   COPD - Notes she's been off inhalers for about a year as they'd never been effective for her in the past. Used to be on O2 which was very beneficial. Followed by Pulmonology but has not been since early last year due to [place] restrictions per patient. In the past has been on [place], anoro, and unsure which others. Frequently SOB, sometimes wheezing, coughing.  Back still hurting daily, recent imaging showed DDD changes.   Relevant past medical, surgical, family and social history reviewed and updated as indicated. Interim medical history since our last visit reviewed. Allergies and medications reviewed and updated.  Review of Systems  Per HPI unless specifically indicated above     Objective:    BP 137/90   Pulse 88   Temp 97.9 F (36.6 C) (Oral)   Wt 232 lb (105.2 kg)   SpO2 97%   BMI 42.43 kg/m   Wt Readings from Last 3 Encounters:  04/15/20 232 lb (105.2 kg)  03/11/20 230 lb (104.3 kg)  03/02/20 236 lb (107 kg)    Physical Exam Vitals and nursing note reviewed.  Constitutional:      Appearance: Normal appearance. She is not ill-appearing.  HENT:     Head: Atraumatic.  Eyes:     Extraocular Movements: Extraocular movements intact.     Conjunctiva/sclera: Conjunctivae normal.  Cardiovascular:     Rate and Rhythm: Normal rate and regular rhythm.     Heart sounds: Normal heart sounds.  Pulmonary:     Effort: Pulmonary effort is normal.     Comments: Breath sounds decreased throughout Musculoskeletal:        General:  Normal range of motion.     Cervical back: Normal range of motion and neck supple.  Skin:    General: Skin is warm and dry.  Neurological:     Mental Status: She is alert and oriented to person, place, and time.  Psychiatric:        Mood and Affect: Mood normal.        Thought Content: Thought content normal.        Judgment: Judgment normal.     Results for orders placed or performed in visit on 03/02/20  POCT glycosylated hemoglobin (Hb A1C)  Result Value Ref Range   Hemoglobin A1C 7.2 (A) 4.0 - 5.6 %   Estimated Average Glucose 160       Assessment & Plan:   Problem List Items Addressed This Visit      Respiratory   COPD (chronic obstructive pulmonary disease) ([place])    Trelegy sent, albuterol prn. F/u with Pulmonology for further mgmt when able      Relevant Medications   Fluticasone-Umeclidin-Vilant (TRELEGY ELLIPTA) 100-62.5-25 MCG/INH AEPB   albuterol (VENTOLIN HFA) 108 (90 Base) MCG/ACT inhaler     Musculoskeletal and Integument   DDD (degenerative disc disease), lumbar    Discussed weight loss, low  impact exercises, prn OTC pain relievers, lidocaine patches        Other   Insomnia - Primary    Increase seroquel, monitor for benefit.           Follow up plan: Return in about 2 months (around 06/15/2020) for 6 month f/u.

## 2020-04-15 NOTE — Chronic Care Management (AMB) (Signed)
Chronic Care Management   Note  04/15/2020 Name: Kim Buchanan MRN: [id number] DOB: [birth year]   Subjective:  Kim Buchanan is a 69 y.o. year old female who is a primary care patient of Kim Buchanan, [place]. The CCM team was consulted for assistance with chronic disease management and care coordination needs.    Contacted patient for medication access needs.   Kim Buchanan was given information about Chronic Care Management services today including:  1. CCM service includes personalized support from designated clinical staff supervised by her physician, including individualized plan of care and coordination with other care providers 2. 24/7 contact phone numbers for assistance for urgent and routine care needs. 3. Service will only be billed when office clinical staff spend 20 minutes or more in a month to coordinate care. 4. Only one practitioner may furnish and bill the service in a calendar month. 5. The patient may stop CCM services at any time (effective at the end of the month) by phone call to the office staff. 6. The patient will be responsible for cost sharing (co-pay) of up to 20% of the service fee (after annual deductible is met).  Patient agreed to services and verbal consent obtained.   Review of patient status, including review of consultants reports, laboratory and other test data, was performed as part of comprehensive evaluation and provision of chronic care management services.   SDOH (Social Determinants of Health) assessments and interventions performed:    Objective:  Lab Results  Component Value Date   CREATININE 0.93 11/30/2019   CREATININE 0.85 09/08/2019   CREATININE 0.93 08/14/2019    Lab Results  Component Value Date   HGBA1C 7.2 (A) 03/02/2020       Component Value Date/Time   CHOL 66 (L) 11/30/2019 1135   TRIG 69 11/30/2019 1135   HDL 43 11/30/2019 1135   CHOLHDL 1.5 11/30/2019 1135   CHOLHDL 1.7 01/01/2018 0000   VLDL 29  08/06/2016 0824   LDLCALC 7 11/30/2019 1135   LDLCALC 29 01/01/2018 0000    Clinical ASCVD: No  The ASCVD Risk score Kim Buchanan., et al., [name]    BP Readings from Last 3 Encounters:  04/15/20 137/90  03/11/20 (!) 151/90  03/02/20 119/79    Allergies  Allergen Reactions  . Lisinopril Cough    Medications Reviewed Today    Reviewed by Kim American, PA-C (Physician Assistant Certified) on 01/74/94 at [place] List Status: <None>  Medication Order Taking? Sig Documenting Provider Last Dose Status Informant        Discontinued 04/15/20 0909 (Reorder)   albuterol (VENTOLIN HFA) 108 (90 Base) MCG/ACT inhaler [id number]  Inhale 2 puffs into the lungs every 6 (six) hours as needed for wheezing or shortness of breath. Kim Buchanan, [place]  Active   aspirin 81 MG tablet [id number] Yes Take 81 mg by mouth daily.  [provider] Taking Active Self           Med Note Kim Buchanan, Kim Buchanan   Fri Jul 19, 2017 12:33 PM)    atorvastatin (LIPITOR) 20 MG tablet [id number] Yes TAKE 1 TABLET BY MOUTH AT  BEDTIME Kim Crews, MD Taking Active Self  blood glucose meter kit and supplies [id number] Yes Dispense based on patient and insurance preference. Check fasting blood sugar once daily. (FOR ICD E11.69). Kim Crews, MD Taking Active  Discontinued 04/15/20 0907 (Reorder)   Fluticasone-Umeclidin-Vilant (TRELEGY ELLIPTA) 100-62.5-25 MCG/INH AEPB [id number]  Inhale 1 puff into the lungs daily. Kim Buchanan, [place]  Active   gabapentin (NEURONTIN) 300 MG capsule [id number] Yes Take 1 capsule (300 mg total) by mouth 3 (three) times daily. Kim Buchanan, [place] Taking Active   isosorbide mononitrate (IMDUR) 30 MG 24 hr tablet [id number] Yes TAKE 1 TABLET BY MOUTH  DAILY Kim Buchanan, Kim Bucy, MD Taking Active Self  Lancets (ONETOUCH DELICA PLUS  [id number]) Kim Buchanan [id number] Yes daily.  [provider] Taking Active   metFORMIN (GLUCOPHAGE) 500 MG tablet [id number] Yes Take 1 tablet (500 mg total) by mouth 2 (two) times daily with a meal. Kim Buchanan, Kim Bucy, MD Taking Active   [facility] ULTRA test strip [id number] Yes 1 each daily. [provider] Taking Active   pantoprazole (PROTONIX) 40 MG tablet [id number] Yes Take 40 mg by mouth daily.  [provider] Taking Active Self  polyethylene glycol powder (GLYCOLAX/MIRALAX) 17 GM/SCOOP powder [id number] Yes Take 1-2 doses daily as needed for constipation/to prevent constipation [provider] Taking Active         Discontinued 04/15/20 0910 (Reorder)   QUEtiapine (SEROQUEL) 100 MG tablet [id number]  Take 1 tablet (100 mg total) by mouth at bedtime. Kim Buchanan, [place]  Active         Discontinued 04/15/20 9024 (Dose change)            Assessment:   Goals Addressed              This Visit's Progress     Patient Stated   .  PharmD "I need help affording this inhaler" (pt-stated)        CARE PLAN ENTRY (see longitudinal plan of care for additional care plan information)  Current Barriers:  . Polypharmacy; complex patient with multiple comorbidities including COPD, CAD, HTN, HLD . Financial concerns - reports that when she called [facility] today to check on the cost of the Trelegy, it was too expensive . Most recent eGFR: 64 mL/min o COPD: reports that no inhalers have ever seemed to help. Normal PFTs. Multiple no-shows o CAD: follows w/ Dr. Rockey Situ, multiple no-shows. ASA 81 mg daily, atorvastatin 20 mg daily, imdur 30 mg daily o T2DM; last A1c 7.2%; metformin 500 mg BID o Peripheral neuropathy: gabapentin 300 mg TID   Pharmacist Clinical Goal(s):  Marland Kitchen Over the next 90 days, patient will work with PharmD and provider towards optimized medication management  Interventions: . Inter-disciplinary care team collaboration (see longitudinal plan  of care) . Contacted [facility]. Copay for 90 day was $240, 30 day $142. Appears patient has not mer her deductible yet, and reports the inability to be able to pay that. Patient notes that inhalers "never help" anyway. Discussed patient assistance program availability and income requirements. Patient is going to talk to Dr. Patsey Berthold at next appt to determine if we need to pursue inhaler assistance.  . Will collaborate w/ RN CM for follow up, support of care.   Patient Self Care Activities:  . Patient will take medications as prescribed  Initial goal documentation        Plan: - Scheduled f/u call the week after patient's pulmonary appointment  Catie Darnelle Maffucci, PharmD, [place] [phone]

## 2020-04-15 NOTE — Telephone Encounter (Signed)
Patient notified. States she just spoke with Joellen Jersey as well.

## 2020-04-15 NOTE — Telephone Encounter (Signed)
Patient is calling to state that Trilegy is too munch money. Pharmacy states if Apolonio Schneiders would call the pharmacy. They could change the script to a tier 1 or tier 2. The patient would pay nothing [facility] Please advise with the patient [phone]

## 2020-04-15 NOTE — Telephone Encounter (Signed)
Patient states that there is nothing covered under her plan for level 1 and 2, unsure if a different type of medication can be sent in, or if Catie could help.   Please advice, patient states that she cant afford the trellegy.

## 2020-04-15 NOTE — Telephone Encounter (Signed)
Patient will call and check on what medications are covered.

## 2020-04-15 NOTE — Telephone Encounter (Signed)
Can we see what's preferred?

## 2020-04-15 NOTE — Patient Instructions (Signed)
Visit Information  Goals Addressed              This Visit's Progress     Patient Stated   .  PharmD "I need help affording this inhaler" (pt-stated)        CARE PLAN ENTRY (see longitudinal plan of care for additional care plan information)  Current Barriers:  . Polypharmacy; complex patient with multiple comorbidities including COPD, CAD, HTN, HLD . Financial concerns - reports that when she called [facility] today to check on the cost of the Trelegy, it was too expensive . Most recent eGFR: 64 mL/min o COPD: reports that no inhalers have ever seemed to help. Normal PFTs. Multiple no-shows o CAD: follows w/ Dr. Rockey Situ, multiple no-shows. ASA 81 mg daily, atorvastatin 20 mg daily, imdur 30 mg daily o T2DM; last A1c 7.2%; metformin 500 mg BID o Peripheral neuropathy: gabapentin 300 mg TID   Pharmacist Clinical Goal(s):  Marland Kitchen Over the next 90 days, patient will work with PharmD and provider towards optimized medication management  Interventions: . Inter-disciplinary care team collaboration (see longitudinal plan of care) . Contacted [facility]. Copay for 90 day was $240, 30 day $142. Appears patient has not mer her deductible yet, and reports the inability to be able to pay that. Patient notes that inhalers "never help" anyway. Discussed patient assistance program availability and income requirements. Patient is going to talk to Dr. Patsey Berthold at next appt to determine if we need to pursue inhaler assistance.  . Will collaborate w/ RN CM for follow up, support of care.   Patient Self Care Activities:  . Patient will take medications as prescribed  Initial goal documentation        Ms. Keatley was given information about Chronic Care Management services today including:  1. CCM service includes personalized support from designated clinical staff supervised by her physician, including individualized plan of care and coordination with other care providers 2. 24/7 contact phone numbers  for assistance for urgent and routine care needs. 3. Service will only be billed when office clinical staff spend 20 minutes or more in a month to coordinate care. 4. Only one practitioner may furnish and bill the service in a calendar month. 5. The patient may stop CCM services at any time (effective at the end of the month) by phone call to the office staff. 6. The patient will be responsible for cost sharing (co-pay) of up to 20% of the service fee (after annual deductible is met).  Patient agreed to services and verbal consent obtained.   The patient verbalized understanding of instructions provided today and declined a print copy of patient instruction materials.   Plan: - Scheduled f/u call the week after patient's pulmonary appointment  Catie Darnelle Maffucci, PharmD, [place] [phone]

## 2020-04-15 NOTE — Telephone Encounter (Signed)
I placed CCM referral to see if Catie had ideas for [place] and I will put some samples up front to help bridge her until we can hopefully find something affordable

## 2020-04-15 NOTE — Telephone Encounter (Signed)
Routing to provider  

## 2020-04-16 NOTE — Assessment & Plan Note (Signed)
Trelegy sent, albuterol prn. F/u with Pulmonology for further mgmt when able

## 2020-04-16 NOTE — Assessment & Plan Note (Signed)
Discussed weight loss, low impact exercises, prn OTC pain relievers, lidocaine patches

## 2020-04-16 NOTE — Assessment & Plan Note (Signed)
Increase seroquel, monitor for benefit.

## 2020-05-11 ENCOUNTER — Encounter: Payer: Self-pay | Admitting: Pulmonary Disease

## 2020-05-11 ENCOUNTER — Ambulatory Visit: Payer: Medicare Other | Admitting: Pulmonary Disease

## 2020-05-11 ENCOUNTER — Other Ambulatory Visit: Payer: Self-pay

## 2020-05-11 VITALS — BP 134/78 | HR 94 | Temp 97.7°F | Ht 62.0 in | Wt 236.0 lb

## 2020-05-11 DIAGNOSIS — R0602 Shortness of breath: Secondary | ICD-10-CM | POA: Diagnosis not present

## 2020-05-11 NOTE — Patient Instructions (Signed)
I do not think your shortness of breath is caused by your lungs.  Your oxygen saturation remained normal today during your walking however your heart rate went up really fast very quickly and you got really short of breath with this.  We will repeat lung function study and echocardiogram.   You need to be reevaluated by Dr. Rockey Situ again.   We will see you in follow-up in 2 months time call sooner should any new problems arise.

## 2020-05-11 NOTE — Progress Notes (Signed)
Subjective:    Buchanan ID: Kim Buchanan, female    DOB: 01/30/1951, 69 y.o.   MRN: [id number]  HPI Kim Buchanan is a 69 year old former smoker (quit 2020) who presents for follow-up on Kim issue of dyspnea.  Recall that she had been previously followed by Dr. Ashby Dawes for Kim same issue.  Pulmonary function testing performed 05 June 2018 was NORMAL in all aspects to include spirometry, lung volumes and diffusion capacity.  She was well over 100% Tylenol numbers.  She has never noted any improvement with bronchodilators.  She has had weight gain over Kim last 2 years.  I first evaluated Kim Buchanan on 14 August 2019, 69 y.o.  At that time a 2D echo was ordered as well as PFTs.  PFTs were not repeated due to COVID-19 restrictions which have now been lifted.  Kim Buchanan did have a 2D echo that at Kim time showed an EF of sent.  Buchanan subsequently had a heart cath (both right and left heart cath) that did not show decreased LVEF but did show some lesions particularly Kim RCA which had a 70 to 30% lesion however, follow-up coronary pressure study showed that this was not "hemodynamically significant". Buchanan continues to complain bitterly of dyspnea.  No chest pain per se but does feel chest tightness on occasion.  No cough, sputum production, hemoptysis, calf tenderness.  She does have lower extremity edema towards Kim end of Kim day.  Recently, her primary care provider had her try Trelegy Ellipta, this has not changed her dyspnea in any way.  Buchanan does not feel it is helping at all.  Review of Systems A 10 point review of systems was performed and it is as noted above otherwise negative.  Allergies  Allergen Reactions  . Lisinopril Cough   Current Meds  Medication Sig  . albuterol (VENTOLIN HFA) 108 (90 Base) MCG/ACT inhaler Inhale 2 puffs into Kim lungs every 6 (six) hours as needed for wheezing or shortness of breath.  Marland Kitchen aspirin 81 MG tablet Take 81 mg by mouth daily.   Marland Kitchen atorvastatin  (LIPITOR) 20 MG tablet TAKE 1 TABLET BY MOUTH AT  BEDTIME  . blood glucose meter kit and supplies Dispense based on Buchanan and insurance preference. Check fasting blood sugar once daily. (FOR ICD E11.69).  . Fluticasone-Umeclidin-Vilant (TRELEGY ELLIPTA) 100-62.5-25 MCG/INH AEPB Inhale 1 puff into Kim lungs daily.  Marland Kitchen gabapentin (NEURONTIN) 300 MG capsule Take 1 capsule (300 mg total) by mouth 3 (three) times daily.  . isosorbide mononitrate (IMDUR) 30 MG 24 hr tablet TAKE 1 TABLET BY MOUTH  DAILY  . Lancets (ONETOUCH DELICA PLUS [id number]) MISC daily.   . metFORMIN (GLUCOPHAGE) 500 MG tablet Take 1 tablet (500 mg total) by mouth 2 (two) times daily with a meal.  . ONETOUCH ULTRA test strip 1 each daily.  . pantoprazole (PROTONIX) 40 MG tablet Take 40 mg by mouth daily.   . polyethylene glycol powder (GLYCOLAX/MIRALAX) 17 GM/SCOOP powder Take 1-2 doses daily as needed for constipation/to prevent constipation  . QUEtiapine (SEROQUEL) 100 MG tablet Take 1 tablet (100 mg total) by mouth at bedtime.   Immunization History  Administered Date(s) Administered  . Influenza, High Dose Seasonal PF 06/16/2019  . Influenza, Seasonal, Injecte, Preservative Fre 08/18/2008, 07/14/2014  . Influenza,inj,Quad PF,6+ Mos 09/25/2013, 08/05/2015, 08/02/2016, 07/24/2017  . Moderna SARS-COVID-2 Vaccination 12/02/2019, 12/30/2019  . Pneumococcal Conjugate-13 03/13/2018  . Pneumococcal Polysaccharide-23 08/06/2019  . Tdap 08/08/2009   Social History   Tobacco Use  .  Smoking status: Former Smoker    Packs/day: 0.50    Years: 47.00    Pack years: 23.50    Types: Cigarettes    Quit date: 11/29/2018    Years since quitting: 1.4  . Smokeless tobacco: Never Used  . Tobacco comment: started at age 14  Substance Use Topics  . Alcohol use: No    Alcohol/week: 0.0 standard drinks   Continues to remain abstinent of cigarette use.     Objective:   Physical Exam BP 134/78 (BP Location: Left Arm, Cuff Size: Large)    Pulse 94   Temp 97.7 F (36.5 C) (Temporal)   Ht 5' 2" (1.575 m)   Wt 236 lb (107 kg)   SpO2 96%   BMI 43.16 kg/m  GENERAL: Morbidly obese woman, presents in transport chair.  Somewhat tachypneic but no use of accessories.  Awake, alert. HEAD: Normocephalic, atraumatic.  EYES: Pupils equal, round, reactive to light.  No scleral icterus.  MOUTH: Nose/mouth/throat not examined due to masking requirements for COVID 19. NECK: Supple. No thyromegaly. Trachea midline. No JVD.  No adenopathy. PULMONARY: Symmetrical air entry.  Lungs clear to auscultation bilaterally. CARDIOVASCULAR: S1 and S2. Regular rate and rhythm.  No rubs, murmurs or gallops heard. GASTROINTESTINAL: Abdomen, nondistended. MUSCULOSKELETAL: No joint deformity, no clubbing, trace edema lower extremities.  NEUROLOGIC: Awake, alert, no focal deficits noted.  On ambulation no gait disturbance.  Speech is fluent. SKIN: Intact,warm,dry.  On limited exam, no rashes. PSYCH: Mood is depressed, behavior normal.  Ambulatory oximetry performed: Baseline heart rate was 95 less than 250 feet of ambulation heart rate jumped drastically up to 162 with Kim Buchanan having sensation of dyspnea.  Oxygen saturations remained steady at 94%.    Assessment & Plan:     ICD-10-CM   1. SOB (shortness of breath)  R06.02 ECHOCARDIOGRAM COMPLETE    Pulmonary Function Test [facility] Only   We will repeat PFTs Not responsive to bronchodilators No desaturation with exercise Multifactorial Obesity/deconditioning CAD?  2. Morbid obesity ([place])  E66.01    Weight loss recommended   Orders Placed This Encounter  Procedures  . Pulmonary Function Test [facility] Only    Order Specific Question:   Full PFT: includes Kim following: basic spirometry, spirometry pre & post bronchodilator, diffusion capacity (DLCO), lung volumes    Answer:   Full PFT  . ECHOCARDIOGRAM COMPLETE    Standing Status:   Future    Standing Expiration Date:   11/11/2020    Order Specific  Question:   Where should this test be performed    Answer:   [facility]    Order Specific Question:   Please indicate who you request to read Kim echo results.    Answer:   [facility]    Order Specific Question:   Perflutren DEFINITY (image enhancing agent) should be administered unless hypersensitivity or allergy exist    Answer:   Administer Perflutren    Order Specific Question:   Reason for exam-Echo    Answer:   Dyspnea  786.09 / R06.00   Discussion:  Buchanan continues to bitterly complain of dyspnea which is likely multifactorial.  Cannot ascribe it to pulmonary issues she has had entirely normal pulmonary function testing in late 2019, no response to bronchodilators, no oxygen desaturations with ambulation.  Possibilities include: obesity/deconditioning/CAD. Will reassess with PFTs, revisit with cardiology for issues with RCA disease.  Recheck 2D echo. We will see Kim Buchanan in follow-up sooner should any new difficulties arise.  Renold Don, MD [facility]   *This note was dictated using voice recognition software/Dragon.  Despite best efforts to proofread, errors can occur which can change Kim meaning.  Any change was purely unintentional.

## 2020-05-12 NOTE — Progress Notes (Signed)
Cardiology Office Note    Date:  05/19/2020   ID:  Kim Buchanan, DOB [birth year], MRN [id number]  PCP:  Volney American, PA-C  Cardiologist:  Ida Rogue, MD  Electrophysiologist:  None   Chief Complaint: Follow-up  History of Present Illness:   Kim Buchanan is a 69 y.o. female with history of nonobstructive CAD, HFrEF secondary to NICM, COPD secondary to tobacco use quitting in 11/2018, DM2, HLD, chronic dyspnea, obesity, untreated sleep apnea, GERD, anxiety, and depression who presents for follow-up of her cardiomyopathy.  She was previously followed by Dr. Humphrey Rolls and underwent echo in 03/2019 which showed normal LV systolic function.  Stress testing was undertaken in the setting of ongoing dyspnea which showed anterior ischemia.  Following this, there was recommendation for coronary CTA though the patient did not follow-up and was subsequently evaluated by pulmonology.  In this setting she was referred to Dr. Rockey Situ who saw the patient in 07/2019 for follow-up of the above prior cardiac testing.  Records were not available at that time, therefore the patient underwent repeat echo in 08/2019 which revealed an EF of 25 to 30%, global hypokinesis, and grade 1 diastolic dysfunction.  There was no significant valvular disease.  Given her new cardiomyopathy, she underwent diagnostic cardiac cath in 08/2019 which revealed a 70% stenosis in the proximal RCA.  iFR was normal at 0.97.  Medical therapy was recommended.  Of note, LV gram was performed and showed an EF of 50 to 55%.  Right heart cath showed normal filling pressures.  She was last seen in the office in 09/2019 and was doing well from a cardiac perspective.  She was noted to have resolution of cough following discontinuation of losartan.  She was started on low-dose carvedilol and referred to cardiopulmonary rehab.  Her ongoing dyspnea was felt to be in the setting of deconditioning and underlying pulmonary disease.  She comes in  today accompanied by her husband and continues to note stable exertional dyspnea which has been present for the past couple of years.  She denies any chest pain with this.  She has followed up with pulmonology recently with symptoms felt to be less likely lung etiology with PFTs and echo pending.  She was unable to participate in cardiopulmonary rehab secondary to her ongoing dyspnea.  She has not been weighing at home though her weight is up 7 pounds today when compared to her last clinic visit in 09/2019.  She did not tolerate carvedilol secondary to cough and self discontinued this.  She is tolerating aspirin, atorvastatin, and Imdur.  He denies any lower extremity swelling, abdominal tension, orthopnea, PND, or early satiety.  No palpitations, presyncope, syncope, or dizziness.  Outside of her continued dyspnea she does not have any issues or complaints at this time.   Labs independently reviewed: 02/2020 - A1c 7.2 11/2019 - TC 66, TG 69, HDL 43, LDL 7, Hgb 13.2, PLT 306, BUN 16, serum creatinine 0.93, potassium 4.6, albumin 4.0, AST normal, ALT 48 07/2019 - TSH normal  Past Medical History:  Diagnosis Date  . Anxiety   . Arthritis    lower back  . COPD (chronic obstructive pulmonary disease) ([place])   . Depression   . Diabetes mellitus without complication ([place])   . GERD (gastroesophageal reflux disease)   . Hyperlipidemia   . Morbid obesity ([place])   . NICM (nonischemic cardiomyopathy) ([place])    a. 03/2019 Echo: Nl LVEF; b. 09/04/2019 Echo: EF 25-30%, Gr1 DD, glob  HK, nl RV fxn; c. 09/11/2019 Cath: nonobs LAD/RCA dzs. EF 50-55%. RA 10, RV 29/8(11), PA 27/16(20), PCWP 12. CO/CI 5.04/2.51.  Marland Kitchen Nonobstructive CAD (coronary artery disease)    a. 03/2019 MV Humphrey Rolls): EF 63%, ant ischemia; b. 08/2019 Cath: LM nl, LAD 3m RCA 70p (iFR nl @ 0.97), 50p. EF 50-55%-->Med Rx.  . Osteoporosis   . Panic attack   . PTSD (post-traumatic stress disorder)   . Rectal fistula   . Sleep apnea    a. Prev wore CPAP  but lost insurance and hasn't been using since (now has insurance).  . Wears dentures    full upper    Past Surgical History:  Procedure Laterality Date  . ABDOMINAL HYSTERECTOMY    . COLONOSCOPY  2014  . COLONOSCOPY WITH PROPOFOL N/A 10/01/2016   Procedure: COLONOSCOPY WITH PROPOFOL;  Surgeon: DLucilla Lame MD;  [facility]  Service: Endoscopy;  Laterality: N/A;  . CORONARY PRESSURE WIRE/FFR WITH 3D MAPPING N/A 09/11/2019   Procedure: Coronary Pressure Wire/FFR w/3D Mapping;  Surgeon: ENelva Bush MD;  [facility];  Service: Cardiovascular;  Laterality: N/A;  . CYSTOCELE REPAIR N/A 12/17/2016   Procedure: ANTERIOR REPAIR (CYSTOCELE);  Surgeon: MBrayton Mars MD;  Location: [facility];  Service: Gynecology;  Laterality: N/A;  . ESOPHAGOGASTRODUODENOSCOPY (EGD) WITH PROPOFOL N/A 08/11/2019   Procedure: ESOPHAGOGASTRODUODENOSCOPY (EGD) WITH PROPOFOL;  Surgeon: WLucilla Lame MD;  Location: [facility];  Service: Endoscopy;  Laterality: N/A;  . POLYPECTOMY  10/01/2016   Procedure: POLYPECTOMY;  Surgeon: DLucilla Lame MD;  Location: [facility]  Service: Endoscopy;;  . RECTAL SURGERY  06/28/2015   Rectal prolapse, laparoscopic rectopexy the coldCharmont, MD; [facility]  . RIGHT/LEFT HEART CATH AND CORONARY ANGIOGRAPHY Bilateral 09/11/2019   Procedure: RIGHT/LEFT HEART CATH AND CORONARY ANGIOGRAPHY;  Surgeon: GMinna Merritts MD;  [facility];  Service: Cardiovascular;  Laterality: Bilateral;  . TUBAL LIGATION    . VAGINAL HYSTERECTOMY Bilateral 12/17/2016   Procedure: HYSTERECTOMY VAGINAL WITH BILATERAL SALPINGO OOPHERECTOMY;  Surgeon: MBrayton Mars MD;  Location: [facility];  Service: Gynecology;  Laterality: Bilateral;    Current Medications: Current Meds  Medication Sig  . aspirin 81 MG tablet Take 81 mg by mouth daily.   .Marland Kitchenatorvastatin (LIPITOR) 20 MG tablet TAKE 1 TABLET BY MOUTH AT  BEDTIME  . blood glucose  meter kit and supplies Dispense based on patient and insurance preference. Check fasting blood sugar once daily. (FOR ICD E11.69).  .Marland Kitchengabapentin (NEURONTIN) 300 MG capsule Take 1 capsule (300 mg total) by mouth 3 (three) times daily.  . isosorbide mononitrate (IMDUR) 30 MG 24 hr tablet TAKE 1 TABLET BY MOUTH  DAILY  . Lancets (ONETOUCH DELICA PLUS [id number] MISC daily.   . metFORMIN (GLUCOPHAGE) 500 MG tablet Take 1 tablet (500 mg total) by mouth 2 (two) times daily with a meal.  . ONETOUCH ULTRA test strip 1 each daily.  . pantoprazole (PROTONIX) 40 MG tablet Take 40 mg by mouth daily.   . polyethylene glycol powder (GLYCOLAX/MIRALAX) 17 GM/SCOOP powder Take 1-2 doses daily as needed for constipation/to prevent constipation  . QUEtiapine (SEROQUEL) 100 MG tablet Take 1 tablet (100 mg total) by mouth at bedtime.    Allergies:   Lisinopril   Social History   Socioeconomic History  . Marital status: Married    Spouse name: PPaediatric nurse . Number of children: 1  . Years of education: Not on file  . Highest education level:  9th grade  Occupational History    Employer: DISABLED    Comment: for panic attacks  Tobacco Use  . Smoking status: Former Smoker    Packs/day: 0.50    Years: 47.00    Pack years: 23.50    Types: Cigarettes    Quit date: 11/29/2018    Years since quitting: 1.4  . Smokeless tobacco: Never Used  . Tobacco comment: started at age 43  Vaping Use  . Vaping Use: Never used  Substance and Sexual Activity  . Alcohol use: No    Alcohol/week: 0.0 standard drinks  . Drug use: No  . Sexual activity: Not Currently  Other Topics Concern  . Not on file  Social History Narrative   Pt had one son, who died when was 58 YO in a MVA.   Social Determinants of Health   Financial Resource Strain: Low Risk   . Difficulty of Paying Living Expenses: Not hard at all  Food Insecurity: No Food Insecurity  . Worried About Charity fundraiser in the Last Year: Never true  . Ran Out of  Food in the Last Year: Never true  Transportation Needs: No Transportation Needs  . Lack of Transportation (Medical): No  . Lack of Transportation (Non-Medical): No  Physical Activity: Inactive  . Days of Exercise per Week: 0 days  . Minutes of Exercise per Session: 0 min  Stress: Stress Concern Present  . Feeling of Stress : Very much  Social Connections: Socially Isolated  . Frequency of Communication with Friends and Family: Twice a week  . Frequency of Social Gatherings with Friends and Family: Never  . Attends Religious Services: Never  . Active Member of Clubs or Organizations: No  . Attends Archivist Meetings: Never  . Marital Status: Married     Family History:  The patient's family history includes Cancer in her sister; Diabetes in her brother, brother, mother, sister, and sister; Healthy in her sister; Heart disease in her brother and father; Hypertension in her father; Lymphoma in her brother and brother. There is no history of Breast cancer, Ovarian cancer, or Colon cancer.  ROS:   Review of Systems  Constitutional: Positive for malaise/fatigue. Negative for chills, diaphoresis, fever and weight loss.  HENT: Negative for congestion.   Eyes: Negative for discharge and redness.  Respiratory: Positive for shortness of breath. Negative for cough, sputum production and wheezing.   Cardiovascular: Negative for chest pain, palpitations, orthopnea, claudication, leg swelling and PND.  Gastrointestinal: Negative for abdominal pain, heartburn, nausea and vomiting.  Musculoskeletal: Negative for falls and myalgias.  Skin: Negative for rash.  Neurological: Positive for weakness. Negative for dizziness, tingling, tremors, sensory change, speech change, focal weakness and loss of consciousness.  Endo/Heme/Allergies: Does not bruise/bleed easily.  Psychiatric/Behavioral: Negative for substance abuse. The patient is not nervous/anxious.   All other systems reviewed and are  negative.    EKGs/Labs/Other Studies Reviewed:    Studies reviewed were summarized above. The additional studies were reviewed today:  2D echo 08/2019: 1. Left ventricular ejection fraction, by visual estimation, is 25 to  30%. The left ventricle has moderate to severely decreased function. There  is no left ventricular hypertrophy.  2. Left ventricular diastolic parameters are consistent with Grade I  diastolic dysfunction (impaired relaxation).  3. The left ventricle demonstrates global hypokinesis.  4. Global right ventricle has normal systolic function.The right  ventricular size is normal. No increase in right ventricular wall  thickness.  5. Left atrial  size was normal.  6. TR signal is inadequate for assessing pulmonary artery systolic  Pressure. __________  [facility] 08/2019: Coronary angiography:  Coronary dominance: Right  Left mainstem:   Large vessel that bifurcates into the LAD and left circumflex, no significant disease noted  Left anterior descending (LAD):   Large vessel that extends to the apical region, diagonal branch 2 of moderate size, mild proximal LAD disease estimated at 30%  Left circumflex (LCx):  Large vessel with OM branch 2, no significant disease noted  Right coronary artery (RCA):  Right dominant vessel with PL and PDA, 70% proximal RCA disease followed by sequential 50% lesion proximal to mid vessel  Left ventriculography: Left ventricular systolic function is normal, LVEF is estimated at 50 to 55%, there is no significant mitral regurgitation , no significant aortic valve stenosis  Right heart pressures RA 10 RV 29/8, 11 PA 27/16 mean 20 Wedge 12 Cardiac output 5.04 Cardiac index 2.51  Final Conclusions:   Single-vessel disease RCA Discussed with interventional cardiology, they will perform FFR to determine if intervention needed Right heart pressures are normal   Recommendations:  We will defer coronary intervention to  interventional cardiology Needs weight loss, management of COPD Management of hyperlipidemia __________  iFR 08/2019:  Conclusions: 1. Single vessel coronary with sequential 70% and 50% proximal and mid RCA stenoses that are not hemodynamically significant (iFR = 0.97).  See Dr. Donivan Scull diagnostic catheterization report for further details of right/left heart catheterization.  Recommendations: 1. Medical therapy and risk factor modification to prevent progression of disease.   EKG:  EKG is ordered today.  The EKG ordered today demonstrates NSR, 95 bpm possible prior anterior infarct, incomplete LBBB, unchanged from prior  Recent Labs: 08/14/2019: TSH 2.627 11/30/2019: ALT 48; BUN 16; Creatinine, Ser 0.93; Hemoglobin 13.2; Platelets 306; Potassium 4.6; Sodium 144  Recent Lipid Panel    Component Value Date/Time   CHOL 66 (L) 11/30/2019 1135   TRIG 69 11/30/2019 1135   HDL 43 11/30/2019 1135   CHOLHDL 1.5 11/30/2019 1135   CHOLHDL 1.7 01/01/2018 0000   VLDL 29 08/06/2016 0824   LDLCALC 7 11/30/2019 1135   LDLCALC 29 01/01/2018 0000    PHYSICAL EXAM:    VS:  BP (!) 130/84 (BP Location: Left Arm, Patient Position: Sitting, Cuff Size: Large)   Pulse 95   Ht '5\' 3"'  (1.6 m)   Wt (!) 234 lb 6 oz (106.3 kg)   SpO2 95%   BMI 41.52 kg/m   BMI: Body mass index is 41.52 kg/m.  Physical Exam Constitutional:      Appearance: She is well-developed.  HENT:     Head: Normocephalic and atraumatic.  Eyes:     General:        Right eye: No discharge.        Left eye: No discharge.  Neck:     Vascular: No JVD.  Cardiovascular:     Rate and Rhythm: Normal rate and regular rhythm.     Pulses: No midsystolic click and no opening snap.          Posterior tibial pulses are 2+ on the right side and 2+ on the left side.     Heart sounds: Normal heart sounds, S1 normal and S2 normal. Heart sounds not distant. No murmur heard.  No friction rub.  Pulmonary:     Effort: Pulmonary effort is  normal. No respiratory distress.     Breath sounds: Normal breath sounds. Decreased air movement present.  No decreased breath sounds, wheezing or rales.  Chest:     Chest wall: No tenderness.  Abdominal:     General: There is no distension.     Palpations: Abdomen is soft.     Tenderness: There is no abdominal tenderness.  Musculoskeletal:     Cervical back: Normal range of motion.  Skin:    General: Skin is warm and dry.     Nails: There is no clubbing.  Neurological:     Mental Status: She is alert and oriented to person, place, and time.  Psychiatric:        Speech: Speech normal.        Behavior: Behavior normal.        Thought Content: Thought content normal.        Judgment: Judgment normal.     Wt Readings from Last 3 Encounters:  05/19/20 (!) 234 lb 6 oz (106.3 kg)  05/11/20 236 lb (107 kg)  04/15/20 232 lb (105.2 kg)     ASSESSMENT & PLAN:   1. Exertional dyspnea: This is a longstanding issue and has been relatively stable about his lifestyle limiting.  Prior echo demonstrated a cardiomyopathy with an EF of 25 to 30% with global hypokinesis with subsequent diagnostic R/LHC showing moderate nonobstructive proximal RCA disease with normal IFR and LV gram which demonstrated normal LVSF with normal right heart pressures.  Symptoms have been felt to be multifactorial including possible underlying pulmonary disease with COPD, moderate nonobstructive CAD, morbid obesity, and physical deconditioning.  She is scheduled for PFTs and echo to help further evaluate this.  We will also plan to proceed with coronary CTA with possible FFR.  If this is abnormal we will plan for diagnostic R/LHC.  If her coronary CTA continues to demonstrate nonobstructive CAD consider RHC.  Following the above work-up we will again refer her to cardiopulmonary rehab.  2. HFrEF secondary to NICM: Volume status is somewhat difficult to assess on exam secondary to body habitus.  Nonetheless, she does appear  grossly euvolemic.  There has been some discrepancy regarding her EF with prior echo demonstrating a cardiomyopathy with subsequent diagnostic cath showing normal LVSF.  Await updated echo as outlined above.  Unable to place patient on ACE inhibitor/ARB/beta-blocker secondary to cough with these medications.  This may need to be revisited pending updated echo.  CHF education.  Recommend daily weights.  Further recommendations pending the above work-up.  3. Nonobstructive CAD: She has moderate nonobstructive RCA disease with normal IFR in 08/2019.  She denies any chest pain.  Historically, her moderate nonobstructive disease in the RCA has not been felt to be playing a role in her dyspnea.  Plan for coronary CTA as outlined above.  Continue aspirin, Lipitor, and Imdur.  She did not tolerate addition of carvedilol secondary to cough.  4. HLD: LDL of 7 in 11/2019 with normal AST and mildly elevated ALT.decrease atorvastatin to 10 mg daily.  Follow-up LFT in 2 months.  5. COPD: Secondary to tobacco use.  She quit smoking 11/2018.  Stable.  Followed by with pulmonology.  PFTs pending.  6. Sleep apnea: She has a history of prior sleep apnea and was on CPAP but lost her insurance and therefore caused her CPAP machine.  Follow-up with pulmonology as directed.  7. Morbid obesity/deconditioning: Likely contributing to her above dyspnea.  Weight loss and follow-up with cardiopulmonary rehab is recommended.  Disposition: F/u with Dr. Rockey Situ or an APP in 2 months.   Medication Adjustments/Labs  and Tests Ordered: Current medicines are reviewed at length with the patient today.  Concerns regarding medicines are outlined above. Medication changes, Labs and Tests ordered today are summarized above and listed in the Patient Instructions accessible in Encounters.   Signed, Christell Faith, PA-C 05/19/2020 9:19 AM     [place] [address] [place], [place] [address] [phone]

## 2020-05-13 ENCOUNTER — Telehealth: Payer: Medicare Other

## 2020-05-19 ENCOUNTER — Encounter: Payer: Self-pay | Admitting: Physician Assistant

## 2020-05-19 ENCOUNTER — Ambulatory Visit (INDEPENDENT_AMBULATORY_CARE_PROVIDER_SITE_OTHER): Payer: Medicare Other | Admitting: Physician Assistant

## 2020-05-19 ENCOUNTER — Other Ambulatory Visit: Payer: Self-pay

## 2020-05-19 VITALS — BP 130/84 | HR 95 | Ht 63.0 in | Wt 234.4 lb

## 2020-05-19 DIAGNOSIS — J410 Simple chronic bronchitis: Secondary | ICD-10-CM

## 2020-05-19 DIAGNOSIS — E785 Hyperlipidemia, unspecified: Secondary | ICD-10-CM | POA: Diagnosis not present

## 2020-05-19 DIAGNOSIS — I2510 Atherosclerotic heart disease of native coronary artery without angina pectoris: Secondary | ICD-10-CM | POA: Diagnosis not present

## 2020-05-19 DIAGNOSIS — R0609 Other forms of dyspnea: Secondary | ICD-10-CM

## 2020-05-19 DIAGNOSIS — I428 Other cardiomyopathies: Secondary | ICD-10-CM

## 2020-05-19 DIAGNOSIS — I5022 Chronic systolic (congestive) heart failure: Secondary | ICD-10-CM | POA: Diagnosis not present

## 2020-05-19 DIAGNOSIS — G4733 Obstructive sleep apnea (adult) (pediatric): Secondary | ICD-10-CM

## 2020-05-19 DIAGNOSIS — R0600 Dyspnea, unspecified: Secondary | ICD-10-CM

## 2020-05-19 MED ORDER — ATORVASTATIN CALCIUM 10 MG PO TABS: 10.0000 mg | ORAL_TABLET | Freq: Every day | ORAL | 3 refills | Status: DC

## 2020-05-19 MED ORDER — METOPROLOL TARTRATE 100 MG PO TABS
100.0000 mg | ORAL_TABLET | Freq: Once | ORAL | 0 refills | Status: DC
Start: 2020-05-19 — End: 2020-06-10

## 2020-05-19 NOTE — Patient Instructions (Signed)
Medication Instructions:  1- DECREASE Lipitor to Take 1 tablet (10 mg total) by mouth at bedtime *If you need a refill on your cardiac medications before your next appointment, please call your pharmacy*   Lab Work: 1- Your physician recommends that you return for lab work in:  1 week prior to cardiac CT at the medical mall. No appt is needed. Hours are M-F 7AM- 6 PM.  If you have labs (blood work) drawn today and your tests are completely normal, you will receive your results only by: Marland Kitchen MyChart Message (if you have MyChart) OR . A paper copy in the mail If you have any lab test that is abnormal or we need to change your treatment, we will call you to review the results.   Testing/Procedures: 1- Your cardiac CT will be scheduled at one of the below locations:   [facility] [address] [place], [place] [address] [phone] [phone] [address] [place], [place] [address] [phone]  If scheduled at [facility], please arrive at the [facility] main entrance of [facility] 30 minutes prior to test start time. Proceed to the [facility] Radiology Department (first floor) to check-in and test prep.  If scheduled at [facility], please arrive 15 mins early for check-in and test prep.  Please follow these instructions carefully (unless otherwise directed): 1- Hold metformin 12 hours prior to test.  On the Night Before the Test: . Be sure to Drink plenty of water. . Do not consume any caffeinated/decaffeinated beverages or chocolate 12 hours prior to your test. . Do not take any antihistamines 12 hours prior to your test.  On the Day of the Test: . Drink plenty of water. Do not drink any water within one hour of the test. . Do not eat any food 4 hours prior to the test. . You may take your regular medications prior to the test.  . Take metoprolol (Lopressor) two  hours prior to test. . HOLD Furosemide/Hydrochlorothiazide morning of the test. . FEMALES- please wear underwire-free bra if available       After the Test: . Drink plenty of water. . After receiving IV contrast, you may experience a mild flushed feeling. This is normal. . On occasion, you may experience a mild rash up to 24 hours after the test. This is not dangerous. If this occurs, you can take Benadryl 25 mg and increase your fluid intake. . If you experience trouble breathing, this can be serious. If it is severe call 911 IMMEDIATELY. If it is mild, please call our office. . If you take any of these medications: Glipizide/Metformin, Avandament, Glucavance, please do not take 48 hours after completing test unless otherwise instructed.   Once we have confirmed authorization from your insurance company, we will call you to set up a date and time for your test. Based on how quickly your insurance processes prior authorizations requests, please allow up to 4 weeks to be contacted for scheduling your Cardiac CT appointment. Be advised that routine Cardiac CT appointments could be scheduled as many as 8 weeks after your provider has ordered it.  For non-scheduling related questions, please contact the cardiac imaging nurse navigator should you have any questions/concerns: Marchia Bond, Cardiac Imaging Nurse Navigator Burley Saver, Interim Cardiac Imaging Nurse [place] [facility] Direct Office Dial: [phone]   For scheduling needs, including cancellations and rescheduling, please  call Vivien Rota at [phone], option 3.      Follow-Up: At [facility], you and your health needs are our priority.  As part of our continuing mission to provide you with exceptional heart care, we have created designated Provider Care Teams.  These Care Teams include your primary Cardiologist (physician) and Advanced Practice Providers (APPs -  Physician Assistants and Nurse  Practitioners) who all work together to provide you with the care you need, when you need it.  We recommend signing up for the patient portal called "MyChart".  Sign up information is provided on this After Visit Summary.  MyChart is used to connect with patients for Virtual Visits (Telemedicine).  Patients are able to view lab/test results, encounter notes, upcoming appointments, etc.  Non-urgent messages can be sent to your provider as well.   To learn more about what you can do with MyChart, go to [email or web address].    Your next appointment:   2 month(s)  The format for your next appointment:   In Person  Provider:    You may see Ida Rogue, MD or Christell Faith, PA-C

## 2020-05-20 ENCOUNTER — Other Ambulatory Visit
Admission: RE | Admit: 2020-05-20 | Discharge: 2020-05-20 | Disposition: A | Discharge status: Home / Self Care | Payer: Medicare Other | Attending: Physician Assistant | Admitting: Physician Assistant

## 2020-05-20 DIAGNOSIS — E785 Hyperlipidemia, unspecified: Secondary | ICD-10-CM

## 2020-05-20 LAB — BASIC METABOLIC PANEL
Anion gap: 11 (ref 5–15)
BUN: 24 mg/dL — ABNORMAL HIGH (ref 8–23)
CO2: 23 mmol/L (ref 22–32)
Calcium: 8.6 mg/dL — ABNORMAL LOW (ref 8.9–10.3)
Chloride: 107 mmol/L (ref 98–111)
Creatinine, Ser: 0.89 mg/dL (ref 0.44–1.00)
GFR calc Af Amer: >60 mL/min (ref >60)
GFR calc non Af Amer: >60 mL/min (ref >60)
Glucose, Bld: 140 mg/dL — ABNORMAL HIGH (ref 70–99)
Potassium: 4.3 mmol/L (ref 3.5–5.1)
Sodium: 141 mmol/L (ref 135–145)

## 2020-05-20 NOTE — Addendum Note (Signed)
Addended by: Raelene Bott, Shantavia Jha L on: 05/20/2020 11:34 AM   Modules accepted: Orders

## 2020-05-24 ENCOUNTER — Telehealth: Payer: Self-pay

## 2020-05-24 NOTE — Telephone Encounter (Signed)
Pt is aware of date/time of covid test.   

## 2020-05-27 ENCOUNTER — Other Ambulatory Visit
Admission: RE | Admit: 2020-05-27 | Discharge: 2020-05-27 | Disposition: A | Discharge status: Home / Self Care | Payer: Medicare Other | Source: Ambulatory Visit | Attending: Pulmonary Disease | Admitting: Pulmonary Disease

## 2020-05-27 ENCOUNTER — Other Ambulatory Visit: Payer: Self-pay

## 2020-05-27 DIAGNOSIS — Z01812 Encounter for preprocedural laboratory examination: Secondary | ICD-10-CM | POA: Diagnosis not present

## 2020-05-27 DIAGNOSIS — Z20822 Contact with and (suspected) exposure to covid-19: Secondary | ICD-10-CM | POA: Diagnosis not present

## 2020-05-27 LAB — SARS CORONAVIRUS 2 (TAT 6-24 HRS): SARS Coronavirus 2: NEGATIVE

## 2020-05-30 ENCOUNTER — Other Ambulatory Visit: Payer: Self-pay

## 2020-05-30 ENCOUNTER — Ambulatory Visit (HOSPITAL_COMMUNITY): Payer: Medicare Other

## 2020-05-30 ENCOUNTER — Ambulatory Visit
Admission: RE | Admit: 2020-05-30 | Discharge: 2020-05-30 | Disposition: A | Discharge status: Home / Self Care | Payer: Medicare Other | Source: Ambulatory Visit | Attending: Pulmonary Disease | Admitting: Pulmonary Disease

## 2020-05-30 DIAGNOSIS — G4730 Sleep apnea, unspecified: Secondary | ICD-10-CM | POA: Insufficient documentation

## 2020-05-30 DIAGNOSIS — I051 Rheumatic mitral insufficiency: Secondary | ICD-10-CM | POA: Insufficient documentation

## 2020-05-30 DIAGNOSIS — J449 Chronic obstructive pulmonary disease, unspecified: Secondary | ICD-10-CM | POA: Insufficient documentation

## 2020-05-30 DIAGNOSIS — I428 Other cardiomyopathies: Secondary | ICD-10-CM | POA: Diagnosis not present

## 2020-05-30 DIAGNOSIS — E785 Hyperlipidemia, unspecified: Secondary | ICD-10-CM | POA: Diagnosis not present

## 2020-05-30 DIAGNOSIS — R0602 Shortness of breath: Secondary | ICD-10-CM

## 2020-05-30 DIAGNOSIS — I2510 Atherosclerotic heart disease of native coronary artery without angina pectoris: Secondary | ICD-10-CM | POA: Insufficient documentation

## 2020-05-30 LAB — ECHOCARDIOGRAM COMPLETE
AR max vel: 3.18 cm2
AV Area VTI: 3.34 cm2
AV Area mean vel: 2.74 cm2
AV Mean grad: 4 mmHg
AV Peak grad: 6.6 mmHg
Ao pk vel: 1.28 m/s
Area-P 1/2: 5.88 cm2
S' Lateral: 3.38 cm

## 2020-05-30 LAB — PULMONARY FUNCTION TEST ARMC ONLY
DL/VA % pred: 85 %
DL/VA: 3.58 ml/min/mmHg/L
DLCO unc % pred: 93 %
DLCO unc: 17.85 ml/min/mmHg
FEF 25-75 Post: 2.26 L/sec
FEF 25-75 Pre: 1.47 L/sec
FEF2575-%Change-Post: 54 %
FEF2575-%Pred-Post: 114 %
FEF2575-%Pred-Pre: 74 %
FEV1-%Change-Post: 10 %
FEV1-%Pred-Post: 83 %
FEV1-%Pred-Pre: 76 %
FEV1-Post: 1.89 L
FEV1-Pre: 1.72 L
FEV1FVC-%Change-Post: 0 %
FEV1FVC-%Pred-Pre: 100 %
FEV6-%Change-Post: 9 %
FEV6-%Pred-Post: 85 %
FEV6-%Pred-Pre: 78 %
FEV6-Post: 2.45 L
FEV6-Pre: 2.24 L
FEV6FVC-%Pred-Post: 104 %
FEV6FVC-%Pred-Pre: 104 %
FVC-%Change-Post: 9 %
FVC-%Pred-Post: 82 %
FVC-%Pred-Pre: 75 %
FVC-Post: 2.45 L
Post FEV1/FVC ratio: 77 %
Post FEV6/FVC ratio: 100 %
Pre FEV1/FVC ratio: 77 %
Pre FEV6/FVC Ratio: 100 %
RV % pred: 97 %
RV: 2.02 L
TLC % pred: 92 %
TLC: 4.55 L

## 2020-05-30 MED ORDER — ALBUTEROL SULFATE (2.5 MG/3ML) 0.083% IN NEBU
2.5000 mg | INHALATION_SOLUTION | Freq: Once | RESPIRATORY_TRACT | Status: AC
  Administered 2020-05-30: 2.5 mg via RESPIRATORY_TRACT
  Filled 2020-05-30: qty 3

## 2020-05-30 NOTE — Progress Notes (Signed)
*  PRELIMINARY RESULTS* Echocardiogram 2D Echocardiogram has been performed.  Kim Buchanan 05/30/2020, 10:58 AM

## 2020-06-02 ENCOUNTER — Ambulatory Visit: Payer: Medicare Other | Admitting: Family Medicine

## 2020-06-06 NOTE — Progress Notes (Signed)
Cardiology Office Note    Date:  06/10/2020   ID:  Kim Buchanan, DOB 01-Jul-1951, MRN [id number]  PCP:  Venita Lick, NP  Cardiologist:  Ida Rogue, MD  Electrophysiologist:  None   Chief Complaint: Follow up  History of Present Illness:   Kim Buchanan is a 69 y.o. female with history of nonobstructive CAD, HFrEF secondary to NICM, COPD secondary to tobacco use quitting in 11/2018, restrictive lung disease, DM2, HLD, chronic dyspnea, obesity, untreated sleep apnea, GERD, anxiety, and depression who presents for follow-up of her cardiomyopathy.  She was previously followed by Dr. Humphrey Rolls and underwent echo in 03/2019 which showed normal LV systolic function.  Stress testing was undertaken in the setting of ongoing dyspnea which showed anterior ischemia.  Following this, there was recommendation for coronary CTA though the patient did not follow-up and was subsequently evaluated by pulmonology.  In this setting, she was referred to Dr. Rockey Situ who saw the patient in 07/2019 for follow-up of the above prior cardiac testing.  Records were not available at that time, therefore the patient underwent repeat echo in 08/2019 which revealed an EF of 25 to 30%, global hypokinesis, and grade 1 diastolic dysfunction.  There was no significant valvular disease.  Given her new cardiomyopathy, she underwent diagnostic cardiac cath in 08/2019 which revealed a 70% stenosis in the proximal RCA.  iFR was normal at 0.97.  Medical therapy was recommended.  Of note, LV gram was performed and showed an EF of 50 to 55%.  Right heart cath showed normal filling pressures.  She was seen in the office in 09/2019 and was doing well from a cardiac perspective.  She was noted to have resolution of cough following discontinuation of losartan.  She was started on low-dose carvedilol and referred to cardiopulmonary rehab.  Her ongoing dyspnea was felt to be in the setting of deconditioning and underlying pulmonary  disease.  She was last seen in the office on 7/29 and noted stable symptoms over the past couple of years.  She followed up with pulmonology with lung disease felt to be less likely the etiology of her symptoms.  She was unable to participate in cardiopulmonary rehab secondary to dyspnea. It was noted she did not tolerate Coreg secondary to cough and self discontinued it.  She underwent echo, per pulmonology on 8/9, which showed an EF of 30-35%, Gr2DD, normal RV cavity size, and mild MR.  PFTs showed mild restrictive lung disease.  She was scheduled for coronary CTA on 06/09/2020, which was unable to be performed secondary to heart rates.  She was able to undergo calcium scoring with a calcium score of 2.67 which placed the patient in the 49th percentile for age and sex matched control.  She comes in today accompanied by her husband and continues to note exertional dyspnea, which has been present for the past several years, though does think this has progressed some since she was last seen.  She also notes some mild exertional chest tightness without any rest symptoms.  No lower extremity swelling, abdominal distention, worsening orthopnea, PND, or early satiety.  Weight has been stable.  No palpitations, presyncope, syncope, or dizziness.   Labs independently reviewed: 04/2020 - potassium 4.3, BUN 24, SCr 0.89 02/2020 - A1c 7.2 11/2019 - TC 66, TG 69, HDL 43, LDL 7, Hgb 13.2, PLT 306, albumin 4.0, AST normal, ALT 48 07/2019 - TSH normal   Past Medical History:  Diagnosis Date  . Anxiety   .  Arthritis    lower back  . COPD (chronic obstructive pulmonary disease) ([place])   . Depression   . Diabetes mellitus without complication ([place])   . GERD (gastroesophageal reflux disease)   . Hyperlipidemia   . Morbid obesity ([place])   . NICM (nonischemic cardiomyopathy) ([place])    a. 03/2019 Echo: Nl LVEF; b. 09/04/2019 Echo: EF 25-30%, Gr1 DD, glob HK, nl RV fxn; c. 09/11/2019 Cath: nonobs LAD/RCA dzs. EF  50-55%. RA 10, RV 29/8(11), PA 27/16(20), PCWP 12. CO/CI 5.04/2.51.  Marland Kitchen Nonobstructive CAD (coronary artery disease)    a. 03/2019 [facility] Humphrey Rolls): EF 63%, ant ischemia; b. 08/2019 Cath: LM nl, LAD 23m RCA 70p (iFR nl @ 0.97), 50p. EF 50-55%-->Med Rx.  . Osteoporosis   . Panic attack   . PTSD (post-traumatic stress disorder)   . Rectal fistula   . Sleep apnea    a. Prev wore CPAP but lost insurance and hasn't been using since (now has insurance).  . Wears dentures    full upper    Past Surgical History:  Procedure Laterality Date  . ABDOMINAL HYSTERECTOMY    . COLONOSCOPY  2014  . COLONOSCOPY WITH PROPOFOL N/A 10/01/2016   Procedure: COLONOSCOPY WITH PROPOFOL;  Surgeon: DLucilla Lame MD;  [facility]: [facility]  Service: Endoscopy;  Laterality: N/A;  . CORONARY PRESSURE WIRE/FFR WITH 3D MAPPING N/A 09/11/2019   Procedure: Coronary Pressure Wire/FFR w/3D Mapping;  Surgeon: ENelva Bush MD;  [facility];  Service: Cardiovascular;  Laterality: N/A;  . CYSTOCELE REPAIR N/A 12/17/2016   Procedure: ANTERIOR REPAIR (CYSTOCELE);  Surgeon: MBrayton Mars MD;  Location: [facility];  Service: Gynecology;  Laterality: N/A;  . ESOPHAGOGASTRODUODENOSCOPY (EGD) WITH PROPOFOL N/A 08/11/2019   Procedure: ESOPHAGOGASTRODUODENOSCOPY (EGD) WITH PROPOFOL;  Surgeon: WLucilla Lame MD;  Location: [facility];  Service: Endoscopy;  Laterality: N/A;  . POLYPECTOMY  10/01/2016   Procedure: POLYPECTOMY;  Surgeon: DLucilla Lame MD;  [facility]  Service: Endoscopy;;  . RECTAL SURGERY  06/28/2015   Rectal prolapse, laparoscopic rectopexy the coldCharmont, MD; [facility]  . RIGHT/LEFT HEART CATH AND CORONARY ANGIOGRAPHY Bilateral 09/11/2019   Procedure: RIGHT/LEFT HEART CATH AND CORONARY ANGIOGRAPHY;  Surgeon: GMinna Merritts MD;  [facility];  Service: Cardiovascular;  Laterality: Bilateral;  . TUBAL LIGATION    . VAGINAL HYSTERECTOMY Bilateral  12/17/2016   Procedure: HYSTERECTOMY VAGINAL WITH BILATERAL SALPINGO OOPHERECTOMY;  Surgeon: MBrayton Mars MD;  Location: [facility];  Service: Gynecology;  Laterality: Bilateral;    Current Medications: Current Meds  Medication Sig  . aspirin 81 MG tablet Take 81 mg by mouth daily.   .Marland Kitchenatorvastatin (LIPITOR) 10 MG tablet Take 1 tablet (10 mg total) by mouth at bedtime.  . blood glucose meter kit and supplies Dispense based on patient and insurance preference. Check fasting blood sugar once daily. (FOR ICD E11.69).  .Marland Kitchengabapentin (NEURONTIN) 300 MG capsule Take 1 capsule (300 mg total) by mouth 3 (three) times daily.  . isosorbide mononitrate (IMDUR) 30 MG 24 hr tablet TAKE 1 TABLET BY MOUTH  DAILY  . Lancets (ONETOUCH DELICA PLUS [id number] MISC daily.   . metFORMIN (GLUCOPHAGE) 500 MG tablet Take 1 tablet (500 mg total) by mouth 2 (two) times daily with a meal.  . ONETOUCH ULTRA test strip 1 each daily.  . pantoprazole (PROTONIX) 40 MG tablet Take 40 mg by mouth daily.   . polyethylene glycol powder (GLYCOLAX/MIRALAX) 17 GM/SCOOP powder Take 1-2 doses daily  as needed for constipation/to prevent constipation  . QUEtiapine (SEROQUEL) 100 MG tablet Take 1 tablet (100 mg total) by mouth at bedtime.    Allergies:   Lisinopril   Social History   Socioeconomic History  . Marital status: Married    Spouse name: Paediatric nurse  . Number of children: 1  . Years of education: Not on file  . Highest education level: 9th grade  Occupational History    Employer: DISABLED    Comment: for panic attacks  Tobacco Use  . Smoking status: Former Smoker    Packs/day: 0.50    Years: 47.00    Pack years: 23.50    Types: Cigarettes    Quit date: 11/29/2018    Years since quitting: 1.5  . Smokeless tobacco: Never Used  . Tobacco comment: started at age 4  Vaping Use  . Vaping Use: Never used  Substance and Sexual Activity  . Alcohol use: No    Alcohol/week: 0.0 standard drinks  . Drug use: No  .  Sexual activity: Not Currently  Other Topics Concern  . Not on file  Social History Narrative   Pt had one son, who died when was 39 YO in a MVA.   Social Determinants of Health   Financial Resource Strain: Low Risk   . Difficulty of Paying Living Expenses: Not hard at all  Food Insecurity: No Food Insecurity  . Worried About Charity fundraiser in the Last Year: Never true  . Ran Out of Food in the Last Year: Never true  Transportation Needs: No Transportation Needs  . Lack of Transportation (Medical): No  . Lack of Transportation (Non-Medical): No  Physical Activity: Inactive  . Days of Exercise per Week: 0 days  . Minutes of Exercise per Session: 0 min  Stress: Stress Concern Present  . Feeling of Stress : Very much  Social Connections: Socially Isolated  . Frequency of Communication with Friends and Family: Twice a week  . Frequency of Social Gatherings with Friends and Family: Never  . Attends Religious Services: Never  . Active Member of Clubs or Organizations: No  . Attends Archivist Meetings: Never  . Marital Status: Married     Family History:  The patient's family history includes Cancer in her sister; Diabetes in her brother, brother, mother, sister, and sister; Healthy in her sister; Heart disease in her brother and father; Hypertension in her father; Lymphoma in her brother and brother. There is no history of Breast cancer, Ovarian cancer, or Colon cancer.  ROS:   Review of Systems  Constitutional: Positive for malaise/fatigue. Negative for chills, diaphoresis, fever and weight loss.  HENT: Negative for congestion.   Eyes: Negative for discharge and redness.  Respiratory: Positive for shortness of breath. Negative for cough, sputum production and wheezing.   Cardiovascular: Negative for chest pain, palpitations, orthopnea, claudication, leg swelling and PND.       Chest tightness  Gastrointestinal: Negative for abdominal pain, heartburn, nausea and  vomiting.  Musculoskeletal: Negative for falls and myalgias.  Skin: Negative for rash.  Neurological: Positive for weakness. Negative for dizziness, tingling, tremors, sensory change, speech change, focal weakness and loss of consciousness.  Endo/Heme/Allergies: Does not bruise/bleed easily.  Psychiatric/Behavioral: Negative for substance abuse. The patient is not nervous/anxious.   All other systems reviewed and are negative.    EKGs/Labs/Other Studies Reviewed:    Studies reviewed were summarized above. The additional studies were reviewed today:  Coronary CTA 06/09/2020:  Unable to be  performed secondary to heart rate -Calcium score 2.67 which places the patient in the 49th percentile for age and sex matched control __________   2D echo 05/30/2020: 1. Left ventricular ejection fraction, by estimation, is 30 to 35%. The  left ventricle has moderately decreased function. Left ventricular  endocardial border not optimally defined to evaluate regional wall motion.  Left ventricular diastolic parameters  are consistent with Grade II diastolic dysfunction (pseudonormalization).  2. Right ventricular systolic function was not well visualized. The right  ventricular size is normal. Tricuspid regurgitation signal is inadequate  for assessing PA pressure.  3. The mitral valve is normal in structure. Mild mitral valve  regurgitation. No evidence of mitral stenosis.  4. The aortic valve was not well visualized. Aortic valve regurgitation  is not visualized. No aortic stenosis is present.  5. The inferior vena cava is normal in size with greater than 50%  respiratory variability, suggesting right atrial pressure of 3 mmHg.  6. Challenging image quality. __________  2D echo 08/2019: 1. Left ventricular ejection fraction, by visual estimation, is 25 to  30%. The left ventricle has moderate to severely decreased function. There  is no left ventricular hypertrophy.  2. Left ventricular  diastolic parameters are consistent with Grade I  diastolic dysfunction (impaired relaxation).  3. The left ventricle demonstrates global hypokinesis.  4. Global right ventricle has normal systolic function.The right  ventricular size is normal. No increase in right ventricular wall  thickness.  5. Left atrial size was normal.  6. TR signal is inadequate for assessing pulmonary artery systolic  Pressure. __________  [facility] 08/2019: Coronary angiography:  Coronary dominance: Right  Left mainstem: Large vessel that bifurcates into the LAD and left circumflex, no significant disease noted  Left anterior descending (LAD): Large vessel that extends to the apical region, diagonal branch 2 of moderate size, mild proximal LAD disease estimated at 30%  Left circumflex (LCx): Large vessel with OM branch 2, no significant disease noted  Right coronary artery (RCA): Right dominant vessel with PL and PDA, 70% proximal RCA disease followed by sequential 50% lesion proximal to mid vessel  Left ventriculography: Left ventricular systolic function is normal, LVEF is estimated at 50 to 55%, there is no significant mitral regurgitation , no significant aortic valve stenosis  Right heart pressures RA 10 RV 29/8, 11 PA 27/16 mean 20 Wedge 12 Cardiac output 5.04 Cardiac index 2.51  Final Conclusions:  Single-vessel disease RCA Discussed with interventional cardiology, they will perform FFR to determine if intervention needed Right heart pressures are normal   Recommendations:  We will defer coronary intervention to interventional cardiology Needs weight loss, management of COPD Management of hyperlipidemia __________  iFR 08/2019:  Conclusions: 1. Single vessel coronary with sequential 70% and 50% proximal and mid RCA stenoses that are not hemodynamically significant (iFR = 0.97). See Dr. Donivan Scull diagnostic catheterization report for further details of right/left heart  catheterization.  Recommendations: 1. Medical therapy and risk factor modification to prevent progression of disease.   EKG:  EKG is ordered today.  The EKG ordered today demonstrates NSR, 92 bpm, nonspecific IVCD, frequent PVCs occasionally in a pattern of bigeminy, outside of PVCs tracing is unchanged from prior  Recent Labs: 08/14/2019: TSH 2.627 11/30/2019: ALT 48; Hemoglobin 13.2; Platelets 306 05/20/2020: BUN 24; Creatinine, Ser 0.89; Potassium 4.3; Sodium 141  Recent Lipid Panel    Component Value Date/Time   CHOL 66 (L) 11/30/2019 1135   TRIG 69 11/30/2019 1135   HDL 43  11/30/2019 1135   CHOLHDL 1.5 11/30/2019 1135   CHOLHDL 1.7 01/01/2018 0000   VLDL 29 08/06/2016 0824   LDLCALC 7 11/30/2019 1135   LDLCALC 29 01/01/2018 0000    PHYSICAL EXAM:    VS:  BP 128/80 (BP Location: Left Arm, Patient Position: Sitting, Cuff Size: Normal)   Pulse 92   Ht '5\' 3"'  (1.6 m)   Wt 233 lb 12.8 oz (106.1 kg)   SpO2 95%   BMI 41.42 kg/m   BMI: Body mass index is 41.42 kg/m.  Physical Exam Constitutional:      Appearance: She is well-developed.  HENT:     Head: Normocephalic and atraumatic.  Eyes:     General:        Right eye: No discharge.        Left eye: No discharge.  Neck:     Vascular: No JVD.  Cardiovascular:     Rate and Rhythm: Normal rate and regular rhythm.     Pulses: No midsystolic click and no opening snap.          Posterior tibial pulses are 2+ on the right side and 2+ on the left side.     Heart sounds: Normal heart sounds, S1 normal and S2 normal. Heart sounds not distant. No murmur heard.  No friction rub.  Pulmonary:     Effort: Pulmonary effort is normal. No respiratory distress.     Breath sounds: Normal breath sounds. No decreased breath sounds, wheezing or rales.  Chest:     Chest wall: No tenderness.  Abdominal:     General: There is no distension.     Palpations: Abdomen is soft.     Tenderness: There is no abdominal tenderness.    Musculoskeletal:     Cervical back: Normal range of motion.  Skin:    General: Skin is warm and dry.     Nails: There is no clubbing.  Neurological:     Mental Status: She is alert and oriented to person, place, and time.  Psychiatric:        Speech: Speech normal.        Behavior: Behavior normal.        Thought Content: Thought content normal.        Judgment: Judgment normal.     Wt Readings from Last 3 Encounters:  06/10/20 233 lb 12.8 oz (106.1 kg)  05/19/20 (!) 234 lb 6 oz (106.3 kg)  05/11/20 236 lb (107 kg)     ASSESSMENT & PLAN:   1. HFrEF secondary to NICM with exertional dyspnea: Exertional dyspnea has been a longstanding issue though she does report it is slowly progressing.  Is felt to be multifactorial in etiology including nonischemic cardiomyopathy, nonobstructive CAD, COPD, restrictive lung disease, untreated sleep apnea, morbid obesity, and physical deconditioning.  Unable to proceed with coronary CTA secondary to elevated heart rates.  This will be rescheduled with patient taking 100 mg of Lopressor along with 10 mg of ivabradine 1-2 hour prior to her study.  Add Toprol-XL 25 mg daily given her underlying cardiomyopathy and PVCs noted on EKG today.  Not currently on ACE inhibitor/ARB secondary to cough which resolved following discontinuation of these medications.  In follow-up, consider addition of spironolactone.  Not currently requiring standing diuretic.  If FFR on her coronary CTA is abnormal we will plan to proceed with diagnostic R/LHC.  She will need to be referred to cardiopulmonary rehab following the above work-up.  2. Nonobstructive CAD: She  has moderate RCA disease with normal iFR in 08/2019.  Reschedule coronary CTA as outlined above to evaluate for worsening ischemic heart disease.  Toprol-XL added as outlined above.  Otherwise, she will continue aspirin, Lipitor, and Imdur.  Aggressive risk factor modification recommended.  3. PVCs: Uncertain if her PVC  burden is great enough to be contributing to her underlying cardiomyopathy at this time.  Add Toprol-XL 25 mg daily.  Check BMP and magnesium.  Plan to repeat coronary CTA as outlined above to evaluate for worsening ischemic heart disease.  4. HLD: LDL of 7 in 11/2019 with normal AST and mildly elevated ALT.continue recently decreased dose of atorvastatin 10 mg daily.  Follow-up LFT at next visit.  5. COPD/restrictive lung disease: Stable.  Followed by pulmonology.  Likely contributing to her underlying dyspnea.  6. Sleep apnea: She has a history of sleep apnea and was on CPAP though lost her insurance and therefore no longer has her CPAP machine.  This is likely contributing to her overall presentation and she should follow-up with pulmonology for further recommendations.  7. Morbid obesity/deconditioning: Contributing to her overall presentation.  Weight loss is recommended.  She should follow-up with cardiopulmonary rehab.  Disposition: F/u with Dr. Rockey Situ or an APP in 6 weeks.   Medication Adjustments/Labs and Tests Ordered: Current medicines are reviewed at length with the patient today.  Concerns regarding medicines are outlined above. Medication changes, Labs and Tests ordered today are summarized above and listed in the Patient Instructions accessible in Encounters.   Signed, Christell Faith, PA-C 06/10/2020 8:36 AM     [address] [place], [place] [phone]

## 2020-06-07 ENCOUNTER — Telehealth (HOSPITAL_COMMUNITY): Payer: Self-pay | Admitting: *Deleted

## 2020-06-07 NOTE — Telephone Encounter (Signed)
Reaching out to patient to offer assistance regarding upcoming cardiac imaging study; pt verbalizes understanding of appt date/time, parking situation and where to check in, pre-test NPO status and medications ordered, and verified current allergies; name and call back number provided for further questions should they arise ° °Cariann Kinnamon Tai RN Navigator Cardiac Imaging °[facility] °[phone] office °[phone] cell ° °

## 2020-06-09 ENCOUNTER — Other Ambulatory Visit: Payer: Self-pay

## 2020-06-09 ENCOUNTER — Ambulatory Visit
Admission: RE | Admit: 2020-06-09 | Discharge: 2020-06-09 | Disposition: A | Discharge status: Home / Self Care | Payer: Medicare Other | Source: Ambulatory Visit | Attending: Physician Assistant | Admitting: Physician Assistant

## 2020-06-09 ENCOUNTER — Other Ambulatory Visit: Payer: Self-pay | Admitting: Physician Assistant

## 2020-06-09 DIAGNOSIS — I2510 Atherosclerotic heart disease of native coronary artery without angina pectoris: Secondary | ICD-10-CM | POA: Diagnosis not present

## 2020-06-09 DIAGNOSIS — R0600 Dyspnea, unspecified: Secondary | ICD-10-CM

## 2020-06-09 DIAGNOSIS — K760 Fatty (change of) liver, not elsewhere classified: Secondary | ICD-10-CM | POA: Diagnosis not present

## 2020-06-09 DIAGNOSIS — R0609 Other forms of dyspnea: Secondary | ICD-10-CM

## 2020-06-09 DIAGNOSIS — I700 Atherosclerosis of aorta: Secondary | ICD-10-CM | POA: Insufficient documentation

## 2020-06-09 DIAGNOSIS — J9811 Atelectasis: Secondary | ICD-10-CM | POA: Diagnosis not present

## 2020-06-09 MED ORDER — DILTIAZEM HCL 25 MG/5ML IV SOLN
10.0000 mg | Freq: Once | INTRAVENOUS | Status: AC
  Administered 2020-06-09: 10 mg via INTRAVENOUS

## 2020-06-09 MED ORDER — IOHEXOL 350 MG/ML SOLN
100.0000 mL | Freq: Once | INTRAVENOUS | Status: AC | PRN
  Administered 2020-06-09: 100 mL via INTRAVENOUS

## 2020-06-09 MED ORDER — NITROGLYCERIN 0.4 MG SL SUBL: 0.8000 mg | SUBLINGUAL_TABLET | Freq: Once | SUBLINGUAL | Status: DC

## 2020-06-09 MED ORDER — METOPROLOL TARTRATE 5 MG/5ML IV SOLN
10.0000 mg | Freq: Once | INTRAVENOUS | Status: AC
  Administered 2020-06-09: 10 mg via INTRAVENOUS

## 2020-06-09 NOTE — Progress Notes (Signed)
Patient ambulatory steady gait to exit.

## 2020-06-09 NOTE — Progress Notes (Signed)
Patient heart rate 68-70 after taking Metoprolol 100 mg PO at home, Metoprolol 20 mg IVP and Diltiazem 10 mg IVP. Dr Garen Lah notified and ordered to have calcium score only. Patient verbalized understanding.

## 2020-06-09 NOTE — Progress Notes (Signed)
Spoke with Dr Garen Lah after he read the calcium score. Ordered patient to have Cardiac CT at [facility] Nurse navigator to reschedule.

## 2020-06-10 ENCOUNTER — Encounter: Payer: Self-pay | Admitting: Physician Assistant

## 2020-06-10 ENCOUNTER — Other Ambulatory Visit: Payer: Self-pay

## 2020-06-10 ENCOUNTER — Encounter: Payer: Self-pay | Admitting: Nurse Practitioner

## 2020-06-10 ENCOUNTER — Ambulatory Visit (INDEPENDENT_AMBULATORY_CARE_PROVIDER_SITE_OTHER): Payer: Medicare Other | Admitting: Physician Assistant

## 2020-06-10 VITALS — BP 128/80 | HR 92 | Ht 63.0 in | Wt 233.8 lb

## 2020-06-10 DIAGNOSIS — I5020 Unspecified systolic (congestive) heart failure: Secondary | ICD-10-CM | POA: Insufficient documentation

## 2020-06-10 DIAGNOSIS — R0600 Dyspnea, unspecified: Secondary | ICD-10-CM | POA: Diagnosis not present

## 2020-06-10 DIAGNOSIS — I493 Ventricular premature depolarization: Secondary | ICD-10-CM

## 2020-06-10 DIAGNOSIS — I5022 Chronic systolic (congestive) heart failure: Secondary | ICD-10-CM | POA: Diagnosis not present

## 2020-06-10 DIAGNOSIS — K760 Fatty (change of) liver, not elsewhere classified: Secondary | ICD-10-CM | POA: Insufficient documentation

## 2020-06-10 DIAGNOSIS — J984 Other disorders of lung: Secondary | ICD-10-CM

## 2020-06-10 DIAGNOSIS — I2510 Atherosclerotic heart disease of native coronary artery without angina pectoris: Secondary | ICD-10-CM | POA: Diagnosis not present

## 2020-06-10 DIAGNOSIS — J410 Simple chronic bronchitis: Secondary | ICD-10-CM

## 2020-06-10 DIAGNOSIS — E785 Hyperlipidemia, unspecified: Secondary | ICD-10-CM

## 2020-06-10 DIAGNOSIS — I700 Atherosclerosis of aorta: Secondary | ICD-10-CM | POA: Insufficient documentation

## 2020-06-10 DIAGNOSIS — I428 Other cardiomyopathies: Secondary | ICD-10-CM

## 2020-06-10 DIAGNOSIS — G4733 Obstructive sleep apnea (adult) (pediatric): Secondary | ICD-10-CM

## 2020-06-10 DIAGNOSIS — R0609 Other forms of dyspnea: Secondary | ICD-10-CM

## 2020-06-10 MED ORDER — IVABRADINE HCL 5 MG PO TABS: 10.0000 mg | ORAL_TABLET | Freq: Once | ORAL | 0 refills | Status: AC

## 2020-06-10 MED ORDER — METOPROLOL SUCCINATE ER 25 MG PO TB24: 25.0000 mg | ORAL_TABLET | Freq: Every day | ORAL | 3 refills | Status: DC

## 2020-06-10 MED ORDER — METOPROLOL TARTRATE 100 MG PO TABS: 100.0000 mg | ORAL_TABLET | Freq: Once | ORAL | 0 refills | Status: DC

## 2020-06-10 NOTE — Patient Instructions (Signed)
Medication Instructions:  1- START Toprol XL Take 1 tablet (25 mg total) by mouth daily 2- Take Lopressor One time only 2 hours prior to CT 3- Take Corlanor One time only 2 hours prior to CT  *If you need a refill on your cardiac medications before your next appointment, please call your pharmacy*   Lab Work: Your physician recommends that you have lab work today(BMET, Engineer, materials)  If you have labs (blood work) drawn today and your tests are completely normal, you will receive your results only by: Marland Kitchen MyChart Message (if you have MyChart) OR . A paper copy in the mail If you have any lab test that is abnormal or we need to change your treatment, we will call you to review the results.   Testing/Procedures: Your cardiac CT will be scheduled at one of the below locations:  Once we have confirmed authorization from your insurance company, we will call you to set up a date and time for your test. Based on how quickly your insurance processes prior authorizations requests, please allow up to 4 weeks to be contacted for scheduling your Cardiac CT appointment. Be advised that routine Cardiac CT appointments could be scheduled as many as 8 weeks after your provider has ordered it.  For non-scheduling related questions, please contact the cardiac imaging nurse navigator should you have any questions/concerns: Marchia Bond, Cardiac Imaging Nurse Navigator Burley Saver, Interim Cardiac Imaging Nurse [facility] Direct Office Dial: [phone]   For scheduling needs, including cancellations and rescheduling, please call Vivien Rota at [phone], option 3.      Follow-Up: At [facility], you and your health needs are our priority.  As part of our continuing mission to provide you with exceptional heart care, we have created designated Provider Care Teams.  These Care Teams include your primary Cardiologist (physician) and Advanced Practice Providers (APPs -  Physician  Assistants and Nurse Practitioners) who all work together to provide you with the care you need, when you need it.  We recommend signing up for the patient portal called "MyChart".  Sign up information is provided on this After Visit Summary.  MyChart is used to connect with patients for Virtual Visits (Telemedicine).  Patients are able to view lab/test results, encounter notes, upcoming appointments, etc.  Non-urgent messages can be sent to your provider as well.   To learn more about what you can do with MyChart, go to [email or web address].    Your next appointment:   6 week(s)  The format for your next appointment:   In Person  Provider:    You may see Ida Rogue, MD or Christell Faith, PA-C

## 2020-06-10 NOTE — Addendum Note (Signed)
Addended by: Verlon Au on: 06/10/2020 11:10 AM   Modules accepted: Orders

## 2020-06-11 LAB — BASIC METABOLIC PANEL
BUN/Creatinine Ratio: 24 (ref 12–28)
BUN: 19 mg/dL (ref 8–27)
CO2: 22 mmol/L (ref 20–29)
Calcium: 9.5 mg/dL (ref 8.7–10.3)
Chloride: 102 mmol/L (ref 96–106)
Creatinine, Ser: 0.79 mg/dL (ref 0.57–1.00)
GFR calc Af Amer: 90 mL/min/{1.73_m2} (ref >59)
GFR calc non Af Amer: 78 mL/min/{1.73_m2} (ref >59)
Glucose: 105 mg/dL — ABNORMAL HIGH (ref 65–99)
Potassium: 4.3 mmol/L (ref 3.5–5.2)
Sodium: 140 mmol/L (ref 134–144)

## 2020-06-11 LAB — MAGNESIUM: Magnesium: 1.9 mg/dL (ref 1.6–2.3)

## 2020-06-15 ENCOUNTER — Encounter: Payer: Self-pay | Admitting: Nurse Practitioner

## 2020-06-15 ENCOUNTER — Ambulatory Visit: Payer: Medicare Other | Admitting: Family Medicine

## 2020-06-15 ENCOUNTER — Ambulatory Visit (INDEPENDENT_AMBULATORY_CARE_PROVIDER_SITE_OTHER): Payer: Medicare Other | Admitting: Nurse Practitioner

## 2020-06-15 ENCOUNTER — Other Ambulatory Visit: Payer: Self-pay

## 2020-06-15 VITALS — BP 129/80 | HR 88 | Temp 97.6°F | Wt 237.0 lb

## 2020-06-15 DIAGNOSIS — E1159 Type 2 diabetes mellitus with other circulatory complications: Secondary | ICD-10-CM | POA: Diagnosis not present

## 2020-06-15 DIAGNOSIS — I10 Essential (primary) hypertension: Secondary | ICD-10-CM

## 2020-06-15 DIAGNOSIS — I5022 Chronic systolic (congestive) heart failure: Secondary | ICD-10-CM

## 2020-06-15 DIAGNOSIS — E1169 Type 2 diabetes mellitus with other specified complication: Secondary | ICD-10-CM | POA: Diagnosis not present

## 2020-06-15 DIAGNOSIS — E1142 Type 2 diabetes mellitus with diabetic polyneuropathy: Secondary | ICD-10-CM

## 2020-06-15 DIAGNOSIS — E785 Hyperlipidemia, unspecified: Secondary | ICD-10-CM | POA: Diagnosis not present

## 2020-06-15 DIAGNOSIS — I700 Atherosclerosis of aorta: Secondary | ICD-10-CM | POA: Diagnosis not present

## 2020-06-15 DIAGNOSIS — G4701 Insomnia due to medical condition: Secondary | ICD-10-CM

## 2020-06-15 LAB — BAYER DCA HB A1C WAIVED: HB A1C (BAYER DCA - WAIVED): 6.7 % (ref <7.0)

## 2020-06-15 LAB — MICROALBUMIN, URINE WAIVED
Creatinine, Urine Waived: 200 mg/dL (ref 10–300)
Microalb, Ur Waived: 30 mg/L — ABNORMAL HIGH (ref 0–19)
Microalb/Creat Ratio: <30 mg/g (ref <30)

## 2020-06-15 MED ORDER — DAPAGLIFLOZIN PROPANEDIOL 5 MG PO TABS: 5.0000 mg | ORAL_TABLET | Freq: Every day | ORAL | 4 refills | Status: DC

## 2020-06-15 NOTE — Assessment & Plan Note (Signed)
Noted on CT cardiac imaging 06/09/20.  Will continue ASA and statin for prevention.  Continue to collaborate with cardiology. 

## 2020-06-15 NOTE — Assessment & Plan Note (Signed)
Chronic, ongoing.  Likely related to OSA issues, continue collaboration with pulmonary -- may benefit from CPAP in future.  Discussed with patient Seroquel and risks, do not feel comfortable increasing dose due to her current cardiac risk factors and age.  Recommend she start taking Melatonin, may take up to 10 MG, at night along with her Seroquel -- and try to reduce dose of Seroquel in future if possible.  She reports no benefit from Trazodone in past.  Could consider change to Belsomra in future.  Return in 3 months.

## 2020-06-15 NOTE — Assessment & Plan Note (Signed)
BMI 41.98 with T2DM and HTN + HF.  Recommended eating smaller high protein, low fat meals more frequently and exercising 30 mins a day 5 times a week with a goal of 10-15lb weight loss in the next 3 months. Patient voiced their understanding and motivation to adhere to these recommendations.

## 2020-06-15 NOTE — Assessment & Plan Note (Signed)
Chronic, ongoing.  Followed by cardiology with recent addition of Metoprolol.  Educated her on HF today.  Discussed adding on low dose SGLT2 with her diabetes and HF, which she is agreeable to.  Will start Farxiga 5 MG daily.  Recommend: - Reminded to call for an overnight weight gain of >2 pounds or a weekly weight weight of >5 pounds - not adding salt to his food and has been reading food labels. Reviewed the importance of keeping daily sodium intake to 2000mg  daily

## 2020-06-15 NOTE — Patient Instructions (Addendum)
-   Call for an overnight weight gain of >2 pounds or a weekly weight weight of >5 pounds - No adding salt to his food and has been reading food labels. Reviewed the importance of keeping daily sodium intake to 2000mg  daily  -  Take Melatonin 10 MG nightly  Heart Failure Action Plan A heart failure action plan helps you understand what to do when you have symptoms of heart failure. Follow the plan that was created by you and your health care provider. Review your plan each time you visit your health care provider. Red zone These signs and symptoms mean you should get medical help right away:  You have trouble breathing when resting.  You have a dry cough that is getting worse.  You have swelling or pain in your legs or abdomen that is getting worse.  You suddenly gain more than 2-3 lb (0.9-1.4 kg) in a day, or more than 5 lb (2.3 kg) in one week. This amount may be more or less depending on your condition.  You have trouble staying awake or you feel confused.  You have chest pain.  You do not have an appetite.  You pass out. If you experience any of these symptoms:  Call your local emergency services (911 in the [place] right away or seek help at the emergency department of the nearest hospital. Yellow zone These signs and symptoms mean your condition may be getting worse and you should make some changes:  You have trouble breathing when you are active or you need to sleep with extra pillows.  You have swelling in your legs or abdomen.  You gain 2-3 lb (0.9-1.4 kg) in one day, or 5 lb (2.3 kg) in one week. This amount may be more or less depending on your condition.  You get tired easily.  You have trouble sleeping.  You have a dry cough. If you experience any of these symptoms:  Contact your health care provider within the next day.  Your health care provider may adjust your medicines. Green zone These signs mean you are doing well and can continue what you are  doing:  You do not have shortness of breath.  You have very little swelling or no new swelling.  Your weight is stable (no gain or loss).  You have a normal activity level.  You do not have chest pain or any other new symptoms. Follow these instructions at home:  Take over-the-counter and prescription medicines only as told by your health care provider.  Weigh yourself daily. Your target weight is __________ lb (__________ kg). ? Call your health care provider if you gain more than __________ lb (__________ kg) in a day, or more than __________ lb (__________ kg) in one week.  Eat a heart-healthy diet. Work with a diet and nutrition specialist (dietitian) to create an eating plan that is best for you.  Keep all follow-up visits as told by your health care provider. This is important. Where to find more information  American Heart Association: www.heart.org Summary  Follow the action plan that was created by you and your health care provider.  Get help right away if you have any symptoms in the Red zone. This information is not intended to replace advice given to you by your health care provider. Make sure you discuss any questions you have with your health care provider. Document Revised: 09/20/2017 Document Reviewed: 11/17/2016 Elsevier Patient Education  2020 [employer].

## 2020-06-15 NOTE — Assessment & Plan Note (Signed)
Chronic, ongoing with A1C 6.7% today and urine ALB 30.  Can not use ACE due to past history of cough.  Will continue Metformin as ordered and add on Farxiga 5 MG daily which will benefit HF and diabetes.  Educated patient on this medication.  Recommend she continue to monitor BS daily and document.  Focus on diabetes diet and regular activity.  Return to office in 3 months.

## 2020-06-15 NOTE — Assessment & Plan Note (Addendum)
Chronic, ongoing with BP at goal today.  Continue to collaborate with cardiology, recent notes and echo reviewed.  Continue current medication regimen and adjust as needed.  History of cough with ACE.  Recommend she monitor BP at home at least 3 days a week and document for providers.  Focus on DASH diet.  BMP today.  Return to office in 3 months.

## 2020-06-15 NOTE — Assessment & Plan Note (Signed)
Chronic, ongoing.  Continue current medication regimen and adjust as needed. Lipid panel today. 

## 2020-06-15 NOTE — Progress Notes (Signed)
BP 129/80   Pulse 88   Temp 97.6 F (36.4 C) (Oral)   Wt 237 lb (107.5 kg)   SpO2 93%   BMI 41.98 kg/m    Subjective:    Patient ID: Kim Buchanan, female    DOB: 11/22/1950, 69 y.o.   MRN: [id number]  HPI: Kim Buchanan is a 69 y.o. female  Chief Complaint  Patient presents with  . Diabetes  . Hypertension  . Hyperlipidemia   DIABETES Last A1C in May was 7.2%.  She continues on Metformin 500 MG BID.  No current SGLT2, has underlying heart failure.  Last GFR 78.  Discussed SLGT2 with her and she would like to try a low dose to start.   Hypoglycemic episodes:no Polydipsia/polyuria: no Visual disturbance: no Chest pain: no Paresthesias: no Glucose Monitoring: no  Accucheck frequency: Not Checking  Fasting glucose:  Post prandial:  Evening:  Before meals: Taking Insulin?: no  Long acting insulin:  Short acting insulin: Blood Pressure Monitoring: rarely Retinal Examination: Not up to Date -- she reports having at [facility] in march Foot Exam: Up to Date Pneumovax: Up to Date Influenza: Up to Date Aspirin: yes   HYPERTENSION / HYPERLIPIDEMIA/HF Followed by cardiology and last seen 06/10/20.  Echo on 05/30/20 noted EF 30-35%.  Having cardiac CT in September.  Continues on Imdur, Atorvastatin, Metoprolol XL (started at recent cardiology visit), and ASA.  History of aortic atherosclerosis noted on past imaging.  Has history of cough with ACE and ARB in past.   Satisfied with current treatment? yes Duration of hypertension: chronic BP monitoring frequency: rarely BP range:  BP medication side effects: no Duration of hyperlipidemia: chronic Cholesterol medication side effects: no Cholesterol supplements: none Medication compliance: good compliance Aspirin: yes Recent stressors: no Recurrent headaches: no Visual changes: no Palpitations: no Dyspnea: at baseline Chest pain: no Lower extremity edema: no Dizzy/lightheaded: no   INSOMNIA Started at her last  visit with PCP, Seroquel 100 MG QHS.  She reports "they say I do" have sleep apnea, does not use CPAP at this time -- used years ago.    Pt is aware of risks of psychoactive medication use to include increased sedation, respiratory suppression, falls, extrapyramidal movements,  dependence and cardiovascular events.  Pt would like to continue treatment as benefit determined to outweigh risk.  Duration: chronic Satisfied with sleep quality: no Difficulty falling asleep: no Difficulty staying asleep: yes Waking a few hours after sleep onset: yes Early morning awakenings: yes Daytime hypersomnolence: no Wakes feeling refreshed: no Good sleep hygiene: yes Apnea: yes Snoring: no Depressed/anxious mood: no Recent stress: no Restless legs/nocturnal leg cramps: no Chronic pain/arthritis: no History of sleep study: yes Treatments attempted: melatonin   Relevant past medical, surgical, family and social history reviewed and updated as indicated. Interim medical history since our last visit reviewed. Allergies and medications reviewed and updated.  Review of Systems  Constitutional: Negative for activity change, appetite change, diaphoresis, fatigue and fever.  Respiratory: Negative for cough, chest tightness and shortness of breath.   Cardiovascular: Negative for chest pain, palpitations and leg swelling.  Gastrointestinal: Negative.   Endocrine: Negative for cold intolerance, heat intolerance, polydipsia, polyphagia and polyuria.  Neurological: Negative.   Psychiatric/Behavioral: Negative.     Per HPI unless specifically indicated above     Objective:    BP 129/80   Pulse 88   Temp 97.6 F (36.4 C) (Oral)   Wt 237 lb (107.5 kg)   SpO2 93%  BMI 41.98 kg/m   Wt Readings from Last 3 Encounters:  06/15/20 237 lb (107.5 kg)  06/10/20 233 lb 12.8 oz (106.1 kg)  05/19/20 (!) 234 lb 6 oz (106.3 kg)    Physical Exam Vitals and nursing note reviewed.  Constitutional:      General:  She is awake. She is not in acute distress.    Appearance: She is well-developed and well-groomed. She is morbidly obese. She is not ill-appearing.  HENT:     Head: Normocephalic.     Right Ear: Hearing normal.     Left Ear: Hearing normal.  Eyes:     General: Lids are normal.        Right eye: No discharge.        Left eye: No discharge.     Conjunctiva/sclera: Conjunctivae normal.     Pupils: Pupils are equal, round, and reactive to light.  Neck:     Thyroid: No thyromegaly.     Vascular: No carotid bruit.  Cardiovascular:     Rate and Rhythm: Normal rate and regular rhythm.     Heart sounds: Normal heart sounds. No murmur heard.  No gallop.   Pulmonary:     Effort: Pulmonary effort is normal. No accessory muscle usage or respiratory distress.     Breath sounds: Normal breath sounds.  Abdominal:     General: Bowel sounds are normal.     Palpations: Abdomen is soft.  Musculoskeletal:     Cervical back: Normal range of motion and neck supple.     Right lower leg: No edema.     Left lower leg: No edema.  Skin:    General: Skin is warm and dry.  Neurological:     Mental Status: She is alert and oriented to person, place, and time.  Psychiatric:        Attention and Perception: Attention normal.        Mood and Affect: Mood normal.        Speech: Speech normal.        Behavior: Behavior normal. Behavior is cooperative.        Thought Content: Thought content normal.     Results for orders placed or performed in visit on 06/15/20  Bayer DCA Hb A1c Waived  Result Value Ref Range   HB A1C (BAYER DCA - WAIVED) 6.7 <7.0 %  Microalbumin, Urine Waived  Result Value Ref Range   Microalb, Ur Waived 30 (H) 0 - 19 mg/L   Creatinine, Urine Waived 200 10 - 300 mg/dL   Microalb/Creat Ratio <30 <30 mg/g      Assessment & Plan:   Problem List Items Addressed This Visit      Cardiovascular and Mediastinum   Hypertension associated with diabetes ([place])    Chronic, ongoing with BP  at goal today.  Continue to collaborate with cardiology, recent notes and echo reviewed.  Continue current medication regimen and adjust as needed.  History of cough with ACE.  Recommend she monitor BP at home at least 3 days a week and document for providers.  Focus on DASH diet.  BMP today.  Return to office in 3 months.      Relevant Medications   dapagliflozin propanediol (FARXIGA) 5 MG TABS tablet   Other Relevant Orders   Bayer DCA Hb A1c Waived (Completed)   Basic metabolic panel   Microalbumin, Urine Waived (Completed)   Aortic atherosclerosis (HCC)    Noted on CT cardiac imaging 06/09/20.  Will  continue ASA and statin for prevention.  Continue to collaborate with cardiology.      Chronic systolic HF (heart failure) (HCC)    Chronic, ongoing.  Followed by cardiology with recent addition of Metoprolol.  Educated her on HF today.  Discussed adding on low dose SGLT2 with her diabetes and HF, which she is agreeable to.  Will start Farxiga 5 MG daily.  Recommend: - Reminded to call for an overnight weight gain of >2 pounds or a weekly weight weight of >5 pounds - not adding salt to his food and has been reading food labels. Reviewed the importance of keeping daily sodium intake to 2000mg  daily          Endocrine   Hyperlipidemia associated with type 2 diabetes mellitus (HCC)    Chronic, ongoing.  Continue current medication regimen and adjust as needed.  Lipid panel today.         Relevant Medications   dapagliflozin propanediol (FARXIGA) 5 MG TABS tablet   Other Relevant Orders   Bayer DCA Hb A1c Waived (Completed)   Lipid Panel w/o Chol/HDL Ratio   Type 2 diabetes mellitus with peripheral neuropathy (HCC) - Primary    Chronic, ongoing with A1C 6.7% today and urine ALB 30.  Can not use ACE due to past history of cough.  Will continue Metformin as ordered and add on Farxiga 5 MG daily which will benefit HF and diabetes.  Educated patient on this medication.  Recommend she continue  to monitor BS daily and document.  Focus on diabetes diet and regular activity.  Return to office in 3 months.      Relevant Medications   dapagliflozin propanediol (FARXIGA) 5 MG TABS tablet   Other Relevant Orders   Bayer DCA Hb A1c Waived (Completed)   Microalbumin, Urine Waived (Completed)     Other   Insomnia    Chronic, ongoing.  Likely related to OSA issues, continue collaboration with pulmonary -- may benefit from CPAP in future.  Discussed with patient Seroquel and risks, do not feel comfortable increasing dose due to her current cardiac risk factors and age.  Recommend she start taking Melatonin, may take up to 10 MG, at night along with her Seroquel -- and try to reduce dose of Seroquel in future if possible.  She reports no benefit from Trazodone in past.  Could consider change to Belsomra in future.  Return in 3 months.      Morbid obesity (HCC)    BMI 41.98 with T2DM and HTN + HF.  Recommended eating smaller high protein, low fat meals more frequently and exercising 30 mins a day 5 times a week with a goal of 10-15lb weight loss in the next 3 months. Patient voiced their understanding and motivation to adhere to these recommendations.       Relevant Medications   dapagliflozin propanediol (FARXIGA) 5 MG TABS tablet       Follow up plan: Return in about 3 months (around 09/15/2020) for T2DM, HTN/HLD, INSOMNIA, HF.

## 2020-06-16 LAB — BASIC METABOLIC PANEL
BUN/Creatinine Ratio: 24 (ref 12–28)
BUN: 18 mg/dL (ref 8–27)
CO2: 23 mmol/L (ref 20–29)
Calcium: 8.9 mg/dL (ref 8.7–10.3)
Chloride: 106 mmol/L (ref 96–106)
Creatinine, Ser: 0.76 mg/dL (ref 0.57–1.00)
GFR calc Af Amer: 94 mL/min/{1.73_m2} (ref >59)
GFR calc non Af Amer: 81 mL/min/{1.73_m2} (ref >59)
Glucose: 139 mg/dL — ABNORMAL HIGH (ref 65–99)
Potassium: 4.4 mmol/L (ref 3.5–5.2)
Sodium: 143 mmol/L (ref 134–144)

## 2020-06-16 LAB — LIPID PANEL W/O CHOL/HDL RATIO
Cholesterol, Total: 68 mg/dL — ABNORMAL LOW (ref 100–199)
HDL: 35 mg/dL — ABNORMAL LOW (ref >39)
LDL Chol Calc (NIH): 10 mg/dL (ref 0–99)
Triglycerides: 127 mg/dL (ref 0–149)
VLDL Cholesterol Cal: 23 mg/dL (ref 5–40)

## 2020-06-16 NOTE — Progress Notes (Signed)
Contacted via MyChart  Good afternoon Ms. Steffey.  Your labs continue to look amazing.  Cholesterol at goal and kidney function stable.  Great news. Keep being awesome!!  Thank you for allowing me to participate in your care. Kindest regards, Girtrude Enslin

## 2020-06-21 ENCOUNTER — Telehealth: Payer: Medicare Other

## 2020-06-21 ENCOUNTER — Telehealth: Payer: Self-pay | Admitting: General Practice

## 2020-06-21 ENCOUNTER — Other Ambulatory Visit: Payer: Self-pay | Admitting: Physician Assistant

## 2020-06-21 DIAGNOSIS — I2510 Atherosclerotic heart disease of native coronary artery without angina pectoris: Secondary | ICD-10-CM

## 2020-06-21 NOTE — Telephone Encounter (Signed)
°  Chronic Care Management   Outreach Note  06/21/2020 Name: Kim Buchanan MRN: [id number] DOB: [birth year]  Referred by: Venita Lick, NP Reason for referral : Chronic Care Management (RNCM Initial outreach: Referral from [facility] for Chronic Disease management and Care coordination needs)   An unsuccessful telephone outreach was attempted today. The patient was referred to the case management team for assistance with care management and care coordination.   Follow Up Plan: A HIPPA compliant phone message was left for the patient providing contact information and requesting a return call.   Noreene Larsson RN, MSN, [facility] Mobile: [phone]

## 2020-06-23 ENCOUNTER — Ambulatory Visit: Admission: RE | Admit: 2020-06-23 | Payer: Medicare Other | Source: Ambulatory Visit

## 2020-06-29 ENCOUNTER — Telehealth (HOSPITAL_COMMUNITY): Payer: Self-pay | Admitting: Emergency Medicine

## 2020-06-29 NOTE — Telephone Encounter (Signed)
Reaching out to patient to offer assistance regarding upcoming cardiac imaging study; pt verbalizes understanding of appt date/time, parking situation and where to check in, pre-test NPO status and medications ordered, and verified current allergies; name and call back number provided for further questions should they arise Marchia Bond RN Navigator Cardiac Imaging [facility] [phone] office [phone] cell  Pt instructed to take 2 5mg  corlanor tabs + 100mg  metoprolol for her [facility] appt tomorrow. Pt verbalized understanding

## 2020-06-30 ENCOUNTER — Other Ambulatory Visit: Payer: Self-pay

## 2020-06-30 ENCOUNTER — Ambulatory Visit (HOSPITAL_COMMUNITY)
Admission: RE | Admit: 2020-06-30 | Discharge: 2020-06-30 | Disposition: A | Discharge status: Home / Self Care | Payer: Medicare Other | Source: Ambulatory Visit | Attending: Physician Assistant | Admitting: Physician Assistant

## 2020-06-30 DIAGNOSIS — I2510 Atherosclerotic heart disease of native coronary artery without angina pectoris: Secondary | ICD-10-CM

## 2020-06-30 DIAGNOSIS — R0600 Dyspnea, unspecified: Secondary | ICD-10-CM | POA: Insufficient documentation

## 2020-06-30 DIAGNOSIS — R0609 Other forms of dyspnea: Secondary | ICD-10-CM

## 2020-06-30 MED ORDER — NITROGLYCERIN 0.4 MG SL SUBL
0.8000 mg | SUBLINGUAL_TABLET | Freq: Once | SUBLINGUAL | Status: AC
  Administered 2020-06-30: 0.8 mg via SUBLINGUAL

## 2020-06-30 MED ORDER — IOHEXOL 350 MG/ML SOLN
80.0000 mL | Freq: Once | INTRAVENOUS | Status: AC | PRN
  Administered 2020-06-30: 80 mL via INTRAVENOUS

## 2020-06-30 MED ORDER — NITROGLYCERIN 0.4 MG SL SUBL
SUBLINGUAL_TABLET | SUBLINGUAL | Status: AC
  Filled 2020-06-30: qty 2

## 2020-07-01 DIAGNOSIS — Z6841 Body Mass Index (BMI) 40.0 and over, adult: Secondary | ICD-10-CM | POA: Diagnosis not present

## 2020-07-01 DIAGNOSIS — I2510 Atherosclerotic heart disease of native coronary artery without angina pectoris: Secondary | ICD-10-CM | POA: Diagnosis not present

## 2020-07-01 DIAGNOSIS — R0600 Dyspnea, unspecified: Secondary | ICD-10-CM

## 2020-07-05 ENCOUNTER — Telehealth: Payer: Self-pay

## 2020-07-05 MED ORDER — LOSARTAN POTASSIUM 25 MG PO TABS: 12.5000 mg | ORAL_TABLET | Freq: Every day | ORAL | 3 refills | Status: DC

## 2020-07-05 NOTE — Telephone Encounter (Signed)
-----   Message from Lelon Perla, MD sent at 07/05/2020 12:35 PM EDT ----- Regarding: RE: awaiting FFR FFR was dictated but not showing up in her chart; I will check on why; it did not suggest her CAD was obstructive Kirk Ruths ----- Message ----- From: Alroy Dust.Daleen Bo, RN Sent: 07/05/2020   9:58 AM EDT To: Lelon Perla, MD Subject: awaiting FFR                                   Dr. Stanford Breed,  Do you have a contact I can check with about FFR results? ----- Message ----- From: Sindy Messing Sent: 07/04/2020   7:16 AM EDT To: Alroy Dust.Katrinna Travieso, RN  Can you please check on the status of this FFR?

## 2020-07-05 NOTE — Telephone Encounter (Signed)
Call to patient to review Cardiac CT imaging.    Pt verbalized understanding and has no further questions at this time.    Advised pt to call for any further questions or concerns.  Rx updated.  Confirmed appt for later this month.

## 2020-07-07 NOTE — Telephone Encounter (Signed)
Pt has been r/s  

## 2020-07-12 ENCOUNTER — Ambulatory Visit (INDEPENDENT_AMBULATORY_CARE_PROVIDER_SITE_OTHER): Payer: Medicare Other | Admitting: Pulmonary Disease

## 2020-07-12 ENCOUNTER — Other Ambulatory Visit: Payer: Self-pay

## 2020-07-12 ENCOUNTER — Encounter: Payer: Self-pay | Admitting: Pulmonary Disease

## 2020-07-12 VITALS — BP 132/78 | HR 72 | Temp 96.9°F | Ht 63.0 in | Wt 233.8 lb

## 2020-07-12 DIAGNOSIS — I428 Other cardiomyopathies: Secondary | ICD-10-CM

## 2020-07-12 DIAGNOSIS — R5383 Other fatigue: Secondary | ICD-10-CM

## 2020-07-12 DIAGNOSIS — R0602 Shortness of breath: Secondary | ICD-10-CM

## 2020-07-12 MED ORDER — STIOLTO RESPIMAT 2.5-2.5 MCG/ACT IN AERS: 2.0000 | INHALATION_SPRAY | Freq: Every day | RESPIRATORY_TRACT | 0 refills | Status: AC

## 2020-07-12 NOTE — Progress Notes (Signed)
Subjective:    Patient ID: Kim Buchanan, female    DOB: [birth year], 69 y.o.   MRN: [id number]  HPI Kim Buchanan is a 69 year old former smoker (quit 2020) who presents for follow-up on the issue of dyspnea and fatigue.  She had pulmonary function testing performed August 2021 that showed minimal obstructive defect and suggestion of minimal restriction likely on the basis of obesity.  In essence the studies do not support her disabling dyspnea.  Diffusion capacity was normal.  She also had echocardiogram on the same day which continues to show significantly reduced left ventricular ejection fraction at 30 to 03% and diastolic dysfunction grade 2.  Recently she cardiology added losartan and her primary care provider has added [place].  She has also been diagnosed with fatty liver.  Not had any recent fevers, chills or sweats.  No cough or sputum production.  No chest pain.  She gets easily fatigued and dyspneic on exertion.  We have perform ambulatory oximetry previously that shows no desaturations with exercise.  Review of Systems A 10 point review of systems was performed and it is as noted above otherwise negative.  Allergies  Allergen Reactions  . Lisinopril Cough   Current Meds  Medication Sig  . aspirin 81 MG tablet Take 81 mg by mouth daily.   Marland Kitchen atorvastatin (LIPITOR) 10 MG tablet Take 1 tablet (10 mg total) by mouth at bedtime.  . blood glucose meter kit and supplies Dispense based on patient and insurance preference. Check fasting blood sugar once daily. (FOR ICD E11.69).  . dapagliflozin propanediol (FARXIGA) 5 MG TABS tablet Take 1 tablet (5 mg total) by mouth daily before breakfast.  . gabapentin (NEURONTIN) 300 MG capsule Take 1 capsule (300 mg total) by mouth 3 (three) times daily.  . isosorbide mononitrate (IMDUR) 30 MG 24 hr tablet TAKE 1 TABLET BY MOUTH  DAILY  . Lancets (ONETOUCH DELICA PLUS [id number]) MISC daily.   Marland Kitchen losartan (COZAAR) 25 MG tablet Take 0.5 tablets  (12.5 mg total) by mouth daily.  . metFORMIN (GLUCOPHAGE) 500 MG tablet Take 1 tablet (500 mg total) by mouth 2 (two) times daily with a meal.  . metoprolol succinate (TOPROL XL) 25 MG 24 hr tablet Take 1 tablet (25 mg total) by mouth daily.  Kim Buchanan ULTRA test strip 1 each daily.  . pantoprazole (PROTONIX) 40 MG tablet Take 40 mg by mouth daily.   . polyethylene glycol powder (GLYCOLAX/MIRALAX) 17 GM/SCOOP powder Take 1-2 doses daily as needed for constipation/to prevent constipation  . QUEtiapine (SEROQUEL) 100 MG tablet Take 1 tablet (100 mg total) by mouth at bedtime.   . Immunization History  Administered Date(s) Administered  . Influenza, High Dose Seasonal PF 06/16/2019, 07/04/2020  . Influenza, Seasonal, Injecte, Preservative Fre 08/18/2008, 07/14/2014  . Influenza,inj,Quad PF,6+ Mos 09/25/2013, 08/05/2015, 08/02/2016, 07/24/2017  . Moderna SARS-COVID-2 Vaccination 12/02/2019, 12/30/2019  . Pneumococcal Conjugate-13 03/13/2018  . Pneumococcal Polysaccharide-23 08/06/2019  . Tdap 08/08/2009       Objective:   Physical Exam BP 132/78 (BP Location: Left Arm, Cuff Size: Normal)   Pulse 72   Temp (!) 96.9 F (36.1 C) (Temporal)   Ht '5\' 3"'  (1.6 m)   Wt 233 lb 12.8 oz (106.1 kg)   SpO2 96%   BMI 41.42 kg/m  GENERAL: Obese woman, no acute distress, presents in transport chair HEAD: Normocephalic, atraumatic.  EYES: Pupils equal, round, reactive to light.  No scleral icterus.  MOUTH: Nose/mouth/throat not examined due to masking  requirements for COVID 19. NECK: Supple. No thyromegaly. Trachea midline. No JVD.  No adenopathy. PULMONARY: Good air entry bilaterally.  No adventitious sounds. CARDIOVASCULAR: S1 and S2. Regular rate and rhythm.  No rubs, murmurs or gallops heard. ABDOMEN: Obese, otherwise benign. MUSCULOSKELETAL: No joint deformity, no clubbing, no edema.  NEUROLOGIC: No overt focal deficit.  Speech is fluent. SKIN: Intact,warm,dry.  Limited exam: No  rashes. PSYCH: Mood is depressed, behavior normal  Recent Results (from the past 2160 hour(s))  Basic metabolic panel     Status: Abnormal   Collection Time: 05/20/20 10:08 AM  Result Value Ref Range   Sodium 141 135 - 145 mmol/L   Potassium 4.3 3.5 - 5.1 mmol/L   Chloride 107 98 - 111 mmol/L   CO2 23 22 - 32 mmol/L   Glucose, Bld 140 (H) 70 - 99 mg/dL    Comment: Glucose reference range applies only to samples taken after fasting for at least 8 hours.   BUN 24 (H) 8 - 23 mg/dL   Creatinine, Ser 0.89 0.44 - 1.00 mg/dL   Calcium 8.6 (L) 8.9 - 10.3 mg/dL   GFR calc non Af Amer >60 >60 mL/min   GFR calc Af Amer >60 >60 mL/min   Anion gap 11 5 - 15    Comment: Performed at [facility], [place], [place] [address]  SARS CORONAVIRUS 2 (TAT 6-24 HRS) Nasopharyngeal Nasopharyngeal Swab     Status: None   Collection Time: 05/27/20 12:10 PM   Specimen: Nasopharyngeal Swab  Result Value Ref Range   SARS Coronavirus 2 NEGATIVE NEGATIVE    Comment: (NOTE) SARS-CoV-2 target nucleic acids are NOT DETECTED.  The SARS-CoV-2 RNA is generally detectable in upper and lower respiratory specimens during the acute phase of infection. Negative results do not preclude SARS-CoV-2 infection, do not rule out co-infections with other pathogens, and should not be used as the sole basis for treatment or other patient management decisions. Negative results must be combined with clinical observations, patient history, and epidemiological information. The expected result is Negative.  Fact Sheet for Patients: SugarRoll.be  Fact Sheet for Healthcare Providers: https://www.woods-mathews.com/  This test is not yet approved or cleared by the [place] FDA and  has been authorized for detection and/or diagnosis of SARS-CoV-2 by FDA under an Emergency Use Authorization (EUA). This EUA will remain  in effect (meaning this test can be used)  for the duration of the COVID-19 declaration under Se ction 564(b)(1) of the Act, 21 U.S.C. section 360bbb-3(b)(1), unless the authorization is terminated or revoked sooner.  Performed at [facility], [place] [address] [place], [place] [address]   ECHOCARDIOGRAM COMPLETE     Status: None   Collection Time: 05/30/20 10:58 AM  Result Value Ref Range   Ao pk vel 1.28 m/s   AV Area VTI 3.34 cm2   AR max vel 3.18 cm2   AV Mean grad 4.0 mmHg   AV Peak grad 6.6 mmHg   S' Lateral 3.38 cm   AV Area mean vel 2.74 cm2   Area-P 1/2 5.88 cm2  Pulmonary Function Test [facility] Only     Status: None   Collection Time: 05/30/20 12:38 PM  Result Value Ref Range   FVC-%Pred-Pre 75 %   FVC-Post 2.45 L   FVC-%Pred-Post 82 %   FVC-%Change-Post 9 %   FEV1-Pre 1.72 L   FEV1-%Pred-Pre 76 %   FEV1-Post 1.89 L   FEV1-%Pred-Post 83 %   FEV1-%Change-Post  10 %   FEV6-Pre 2.24 L   FEV6-%Pred-Pre 78 %   FEV6-Post 2.45 L   FEV6-%Pred-Post 85 %   FEV6-%Change-Post 9 %   Pre FEV1/FVC ratio 77 %   FEV1FVC-%Pred-Pre 100 %   Post FEV1/FVC ratio 77 %   FEV1FVC-%Change-Post 0 %   Pre FEV6/FVC Ratio 100 %   FEV6FVC-%Pred-Pre 104 %   Post FEV6/FVC ratio 100 %   FEV6FVC-%Pred-Post 104 %   FEF 25-75 Pre 1.47 L/sec   FEF2575-%Pred-Pre 74 %   FEF 25-75 Post 2.26 L/sec   FEF2575-%Pred-Post 114 %   FEF2575-%Change-Post 54 %   RV 2.02 L   RV % pred 97 %   TLC 4.55 L   TLC % pred 92 %   DLCO unc 17.85 ml/min/mmHg   DLCO unc % pred 93 %   DL/VA 3.58 ml/min/mmHg/L   DL/VA % pred 85 %  Basic metabolic panel     Status: Abnormal   Collection Time: 06/10/20  9:24 AM  Result Value Ref Range   Glucose 105 (H) 65 - 99 mg/dL   BUN 19 8 - 27 mg/dL   Creatinine, Ser 0.79 0.57 - 1.00 mg/dL   GFR calc non Af Amer 78 >59 mL/min/1.73   GFR calc Af Amer 90 >59 mL/min/1.73    Comment: **[facility] currently reports eGFR in compliance with the current**   recommendations of the [employer]. [facility] will    update reporting as new guidelines are published from the NKF-ASN   Task force.    BUN/Creatinine Ratio 24 12 - 28   Sodium 140 134 - 144 mmol/L   Potassium 4.3 3.5 - 5.2 mmol/L   Chloride 102 96 - 106 mmol/L   CO2 22 20 - 29 mmol/L   Calcium 9.5 8.7 - 10.3 mg/dL  Magnesium     Status: None   Collection Time: 06/10/20  9:24 AM  Result Value Ref Range   Magnesium 1.9 1.6 - 2.3 mg/dL  Bayer DCA Hb A1c Waived     Status: None   Collection Time: 06/15/20  3:45 PM  Result Value Ref Range   HB A1C (BAYER DCA - WAIVED) 6.7 <7.0 %    Comment:                                       Diabetic Adult            <7.0                                       Healthy Adult        4.3 - 5.7                                                           (DCCT/NGSP) American Diabetes Association's Summary of Glycemic Recommendations for Adults with Diabetes: Hemoglobin A1c <7.0%. More stringent glycemic goals (A1c <6.0%) may further reduce complications at the cost of increased risk of hypoglycemia.   Microalbumin, Urine Waived     Status: Abnormal   Collection Time: 06/15/20  3:45 PM  Result Value Ref Range   Microalb,  Ur Waived 30 (H) 0 - 19 mg/L   Creatinine, Urine Waived 200 10 - 300 mg/dL   Microalb/Creat Ratio <30 <30 mg/g    Comment:                              Abnormal:       30 - 300                         High Abnormal:           >426   Basic metabolic panel     Status: Abnormal   Collection Time: 06/15/20  3:52 PM  Result Value Ref Range   Glucose 139 (H) 65 - 99 mg/dL   BUN 18 8 - 27 mg/dL   Creatinine, Ser 0.76 0.57 - 1.00 mg/dL   GFR calc non Af Amer 81 >59 mL/min/1.73   GFR calc Af Amer 94 >59 mL/min/1.73    Comment: **[facility] currently reports eGFR in compliance with the current**   recommendations of the [employer]. [facility] will   update reporting as new guidelines are published from the NKF-ASN   Task force.    BUN/Creatinine Ratio 24 12 - 28   Sodium 143 134  - 144 mmol/L   Potassium 4.4 3.5 - 5.2 mmol/L   Chloride 106 96 - 106 mmol/L   CO2 23 20 - 29 mmol/L   Calcium 8.9 8.7 - 10.3 mg/dL  Lipid Panel w/o Chol/HDL Ratio     Status: Abnormal   Collection Time: 06/15/20  3:52 PM  Result Value Ref Range   Cholesterol, Total 68 (L) 100 - 199 mg/dL   Triglycerides 127 0 - 149 mg/dL   HDL 35 (L) >39 mg/dL   VLDL Cholesterol Cal 23 5 - 40 mg/dL   LDL Chol Calc (NIH) 10 0 - 99 mg/dL   Echocardiogram performed on 30 May 2020 shows that she has an EF of 30 to 35% and has grade 2 diastolic dysfunction.  She is being followed by cardiology.  PFTs performed on 30 May 2020 show very minimal if any any obstruction with normal diffusion capacity. There may be a mild restrictive component due to obesity however ERV was not reported.  Overall testing is still within normal limits.    Assessment & Plan:     ICD-10-CM   1. SOB (shortness of breath)  R06.02    Multifactorial Cardiac major component Deconditioning and obesity another major component Minimal component of potential COPD Trial of Stiolto ONOX  2. Fatigue, unspecified type  R53.83    Likely due to cardiac limitation/deconditioning  3. Nonischemic cardiomyopathy (HCC)  I42.8 Pulse oximetry, overnight   LVEF 30 to 83% Diastolic dysfunction as well grade 2 Per cardiology This issue adds complexity to her management  4. Morbid obesity ([place])  E66.01    This issue adds complexity to her management Adds to her deconditioning issues Recommend weight loss   Meds ordered this encounter  Medications  . Tiotropium Bromide-Olodaterol (STIOLTO RESPIMAT) 2.5-2.5 MCG/ACT AERS    Sig: Inhale 2 puffs into the lungs daily for 1 day.    Dispense:  4 g    Refill:  0    Order Specific Question:   Lot Number?    Answer:   [id number]    Order Specific Question:   Expiration Date?    Answer:   03/22/2022  Order Specific Question:   Quantity    Answer:   2   Discussion:  The patient's dyspnea/fatigue  is likely related to her cardiac limitation as well as obesity and deconditioning.  She has significant nonischemic cardiomyopathy.  PFTs continue to be relatively normal with very minimal if any obstruction and very mild restriction suggested likely due to obesity.  Diffusion capacity is normal indicating that there is no parenchymal abnormality.  We will give her a trial of Stiolto though this is unlikely to make her dyspnea significantly better, samples were given to the patient.  We will see the patient in 4 months time she is to contact us prior to that time should any new difficulties arise.  We will get an overnight oximetry to evaluate for potential oxygen desaturations related to obesity and/or cardiac disease.  Renold Don, MD [facility]   *This note was dictated using voice recognition software/Dragon.  Despite best efforts to proofread, errors can occur which can change the meaning.  Any change was purely unintentional.

## 2020-07-12 NOTE — Patient Instructions (Signed)
Your issues with your breathing are likely more related to the heart and the weight issues.  You still have very mild/minimal obstruction noted on your breathing tests I am going to give you a trial of an inhaler to see if this alleviates your shortness of breath somewhat.   Let us know how the Stiolto which is an inhaler, does for you.  It will be 2 puffs once a day.   We will see you in follow-up in 4 months time call sooner should any new problems arise.

## 2020-07-12 NOTE — Progress Notes (Signed)
Cardiology Office Note    Date:  07/20/2020   ID:  Kim Buchanan, DOB [birth year], MRN [id number]  PCP:  Venita Lick, NP  Cardiologist:  Ida Rogue, MD  Electrophysiologist:  None   Chief Complaint: Follow up  History of Present Illness:   Kim Buchanan is a 69 y.o. female with history of nonobstructive CAD, HFrEF secondary toNICM,COPD secondary to tobacco use quitting in 11/2018, restrictive lung disease, DM2, HLD, chronic dyspnea, obesity, untreated sleep apnea, GERD, anxiety, and depression who presents for follow-up of her cardiomyopathy.  She was previously followed by Dr. Jenita Seashore underwent echo in 03/2019 which showed normal LV systolic function. Stress testing was undertaken in the setting of ongoing dyspnea which showed anterior ischemia. Following this, there was recommendation for coronary CTA though the patient did not follow-up and was subsequently evaluated by pulmonology. In this setting, she was referred to Dr. Rockey Situ who saw the patient in 07/2019 for follow-up of the above prior cardiac testing. Records were not available at that time, therefore the patient underwent repeat echo in 08/2019 which revealed an EF of 25 to 30%, global hypokinesis, and grade 1 diastolic dysfunction. There was no significant valvular disease. Given her new cardiomyopathy, she underwent diagnostic cardiac cath in 08/2019 which revealed a 70% stenosis in the proximal RCA. iFR was normal at 0.97. Medical therapy was recommended. Of note, LV gram was performed and showed an EF of 50 to 55%. Right heart cath showed normal filling pressures. She was seen in the office in 09/2019 and was doing well from a cardiac perspective. She was noted to have resolution of cough following discontinuation of losartan. She was started on low-dose carvedilol and referred to cardiopulmonary rehab.   She was seen in the office on 7/29 and noted stable symptoms over the past couple of years.  She  followed up with pulmonology with lung disease felt to be less likely the etiology of her symptoms, with mild restrictive physiology noted on PFTs.  She was unable to participate in cardiopulmonary rehab secondary to dyspnea. It was noted she did not tolerate Coreg secondary to cough and self discontinued it.  She underwent echo, per pulmonology on 8/9, which showed an EF of 30-35%, Gr2DD, normal RV cavity size, and mild MR.  She was scheduled for coronary CTA on 06/09/2020, which was unable to be performed secondary to heart rates.  She was able to undergo calcium scoring with a calcium score of 2.67 which placed the patient in the 49th percentile for age and sex matched control.  She was seen in follow up on 8/20 and continued to feel about the same with ongoing exertional dyspnea.  She subsequently underwent coronary CTA on 06/30/2020 which showed nonobstructive CAD as outlined below.  In this setting, she was started on losartan to further optimize her medical therapy.  She was seen by pulmonology on 9/21 in follow up with noted minimal obstructive, if any, and mild restrictive lung disease.    She comes in accompanied by her husband today and feels about the same as she has over her past several visits continuing to note stable exertional dyspnea which has been present for the past several years.  No chest pain, palpitations, dizziness, presyncope, or syncope.  No lower extremity swelling, orthopnea, PND, or early satiety.  Her weight has been stable at home.  She is tolerating the recently added Toprol without issues.  She has not noted any change in her dyspnea following addition of  inhaler by pulmonology.  She is watching her salt intake and drinking less than 2 L of fluid per day.   Labs independently reviewed: 05/2020 - TC 68, TG 127, HDL 35, LDL 10, BUN 18, SCr 0.76, potassium 4.4, A1c 6.7, magnesium 1.9 11/2019-Hgb 13.2, PLT 306, albumin 4.0, AST normal, ALT 48 07/2019-TSH normal  Past Medical  History:  Diagnosis Date  . Anxiety   . Arthritis    lower back  . COPD (chronic obstructive pulmonary disease) ([place])   . Depression   . Diabetes mellitus without complication ([place])   . GERD (gastroesophageal reflux disease)   . Hyperlipidemia   . Morbid obesity ([place])   . NICM (nonischemic cardiomyopathy) ([place])    a. 03/2019 Echo: Nl LVEF; b. 09/04/2019 Echo: EF 25-30%, Gr1 DD, glob HK, nl RV fxn; c. 09/11/2019 Cath: nonobs LAD/RCA dzs. EF 50-55%. RA 10, RV 29/8(11), PA 27/16(20), PCWP 12. CO/CI 5.04/2.51.  Marland Kitchen Nonobstructive CAD (coronary artery disease)    a. 03/2019 [facility]): EF 63%, ant ischemia; b. 08/2019 Cath: LM nl, LAD 51m RCA 70p (iFR nl @ 0.97), 50p. EF 50-55%-->Med Rx.  . Osteoporosis   . Panic attack   . PTSD (post-traumatic stress disorder)   . Rectal fistula   . Sleep apnea    a. Prev wore CPAP but lost insurance and hasn't been using since (now has insurance).  . Wears dentures    full upper    Past Surgical History:  Procedure Laterality Date  . ABDOMINAL HYSTERECTOMY    . COLONOSCOPY  2014  . COLONOSCOPY WITH PROPOFOL N/A 10/01/2016   Procedure: COLONOSCOPY WITH PROPOFOL;  Surgeon: DLucilla Lame MD;  [facility]  Service: Endoscopy;  Laterality: N/A;  . CORONARY PRESSURE WIRE/FFR WITH 3D MAPPING N/A 09/11/2019   Procedure: Coronary Pressure Wire/FFR w/3D Mapping;  Surgeon: ENelva Bush MD;  [facility];  Service: Cardiovascular;  Laterality: N/A;  . CYSTOCELE REPAIR N/A 12/17/2016   Procedure: ANTERIOR REPAIR (CYSTOCELE);  Surgeon: MBrayton Mars MD;  Location: [facility];  Service: Gynecology;  Laterality: N/A;  . ESOPHAGOGASTRODUODENOSCOPY (EGD) WITH PROPOFOL N/A 08/11/2019   Procedure: ESOPHAGOGASTRODUODENOSCOPY (EGD) WITH PROPOFOL;  Surgeon: WLucilla Lame MD;  Location: [facility];  Service: Endoscopy;  Laterality: N/A;  . POLYPECTOMY  10/01/2016   Procedure: POLYPECTOMY;  Surgeon: DLucilla Lame MD;  [facility]  Service: Endoscopy;;  . RECTAL SURGERY  06/28/2015   Rectal prolapse, laparoscopic rectopexy the coldCharmont, MD; [facility]  . RIGHT/LEFT HEART CATH AND CORONARY ANGIOGRAPHY Bilateral 09/11/2019   Procedure: RIGHT/LEFT HEART CATH AND CORONARY ANGIOGRAPHY;  Surgeon: GMinna Merritts MD;  [facility];  Service: Cardiovascular;  Laterality: Bilateral;  . TUBAL LIGATION    . VAGINAL HYSTERECTOMY Bilateral 12/17/2016   Procedure: HYSTERECTOMY VAGINAL WITH BILATERAL SALPINGO OOPHERECTOMY;  Surgeon: MBrayton Mars MD;  Location: [facility];  Service: Gynecology;  Laterality: Bilateral;    Current Medications: Current Meds  Medication Sig  . aspirin 81 MG tablet Take 81 mg by mouth daily.   .Marland Kitchenatorvastatin (LIPITOR) 10 MG tablet Take 1 tablet (10 mg total) by mouth at bedtime.  . blood glucose meter kit and supplies Dispense based on patient and insurance preference. Check fasting blood sugar once daily. (FOR ICD E11.69).  . dapagliflozin propanediol (FARXIGA) 5 MG TABS tablet Take 1 tablet (5 mg total) by mouth daily before breakfast.  . gabapentin (NEURONTIN) 300 MG capsule Take 1 capsule (300 mg total) by  mouth 3 (three) times daily.  . isosorbide mononitrate (IMDUR) 30 MG 24 hr tablet TAKE 1 TABLET BY MOUTH  DAILY  . Lancets (ONETOUCH DELICA PLUS [id number]) MISC daily.   Marland Kitchen losartan (COZAAR) 25 MG tablet Take 0.5 tablets (12.5 mg total) by mouth daily.  . metFORMIN (GLUCOPHAGE) 500 MG tablet Take 1 tablet (500 mg total) by mouth 2 (two) times daily with a meal.  . metoprolol succinate (TOPROL XL) 25 MG 24 hr tablet Take 1 tablet (25 mg total) by mouth daily.  Glory Rosebush ULTRA test strip 1 each daily.  . pantoprazole (PROTONIX) 40 MG tablet Take 40 mg by mouth daily.   . polyethylene glycol powder (GLYCOLAX/MIRALAX) 17 GM/SCOOP powder Take 1-2 doses daily as needed for constipation/to prevent constipation  . QUEtiapine (SEROQUEL) 100 MG tablet Take 1 tablet  (100 mg total) by mouth at bedtime.    Allergies:   Lisinopril   Social History   Socioeconomic History  . Marital status: Married    Spouse name: Paediatric nurse  . Number of children: 1  . Years of education: Not on file  . Highest education level: 9th grade  Occupational History    Employer: DISABLED    Comment: for panic attacks  Tobacco Use  . Smoking status: Former Smoker    Packs/day: 0.50    Years: 47.00    Pack years: 23.50    Types: Cigarettes    Quit date: 11/29/2018    Years since quitting: 1.6  . Smokeless tobacco: Never Used  . Tobacco comment: started at age 55  Vaping Use  . Vaping Use: Never used  Substance and Sexual Activity  . Alcohol use: No    Alcohol/week: 0.0 standard drinks  . Drug use: No  . Sexual activity: Not Currently  Other Topics Concern  . Not on file  Social History Narrative   Pt had one son, who died when was 24 YO in a MVA.   Social Determinants of Health   Financial Resource Strain: Low Risk   . Difficulty of Paying Living Expenses: Not hard at all  Food Insecurity: No Food Insecurity  . Worried About Charity fundraiser in the Last Year: Never true  . Ran Out of Food in the Last Year: Never true  Transportation Needs: No Transportation Needs  . Lack of Transportation (Medical): No  . Lack of Transportation (Non-Medical): No  Physical Activity: Inactive  . Days of Exercise per Week: 0 days  . Minutes of Exercise per Session: 0 min  Stress: Stress Concern Present  . Feeling of Stress : Very much  Social Connections: Socially Isolated  . Frequency of Communication with Friends and Family: Twice a week  . Frequency of Social Gatherings with Friends and Family: Never  . Attends Religious Services: Never  . Active Member of Clubs or Organizations: No  . Attends Archivist Meetings: Never  . Marital Status: Married     Family History:  The patient's family history includes Cancer in her sister; Diabetes in her brother,  brother, mother, sister, and sister; Healthy in her sister; Heart disease in her brother and father; Hypertension in her father; Lymphoma in her brother and brother. There is no history of Breast cancer, Ovarian cancer, or Colon cancer.  ROS:   Review of Systems  Constitutional: Positive for malaise/fatigue. Negative for chills, diaphoresis, fever and weight loss.  HENT: Negative for congestion.   Eyes: Negative for discharge and redness.  Respiratory: Positive for shortness of  breath. Negative for cough, sputum production and wheezing.   Cardiovascular: Negative for chest pain, palpitations, orthopnea, claudication, leg swelling and PND.  Gastrointestinal: Negative for abdominal pain, heartburn, nausea and vomiting.  Musculoskeletal: Negative for falls and myalgias.  Skin: Negative for rash.  Neurological: Negative for dizziness, tingling, tremors, sensory change, speech change, focal weakness, loss of consciousness and weakness.  Endo/Heme/Allergies: Does not bruise/bleed easily.  Psychiatric/Behavioral: Negative for substance abuse. The patient is not nervous/anxious.   All other systems reviewed and are negative.    EKGs/Labs/Other Studies Reviewed:    Studies reviewed were summarized above. The additional studies were reviewed today:  Coronary CTA 06/30/2020: Aorta:  Normal size.  Mild aortic atherosclerosis.  No dissection.  Aortic Valve:  Trileaflet.  No calcifications.  Coronary Arteries:  Normal coronary origin.  Right dominance.  RCA is a large dominant artery that gives rise to PDA and PLVB. There is mild (25-49%) soft plaque stenosis in the proximal to mid vessel followed by mild (25-49%) mixed plaque stenosis.  Left main is a large artery that gives rise to LAD and LCX arteries.  LAD is a large vessel that gives rise to medium size D1 and D2 and small D3; there is long, soft plaque stenosis in the mid vessel mild (25-49%) followed by moderate (50-69%).  LCX is  a non-dominant artery that gives rise to one large OM1 branch. There is no plaque.  Other findings:  Normal pulmonary vein drainage into the left atrium.  Normal let atrial appendage without a thrombus.  Normal size of the pulmonary artery.  IMPRESSION: 1. Coronary calcium score of 39.3. This was 59 percentile for age and sex matched control.  2. Normal coronary origin with right dominance.  3. Moderate (50-69%) soft plaque stenosis in the mid LAD; mild (25-49%) soft and mixed plaque stenoses in the proximal to mid RCA.  4. Study will be sent for FFR. __________  FFR 07/01/2020: 1. Left Main: findings 0.99, 0.99  2. LAD: findings 0.99, 0.97 0.92  3. OM: Findings 0.98, 0.88 0.84  4. RCA: findings 0.97, 0.81 0.84  IMPRESSION: FFR not suggestive of significant obstructive disease. __________  Coronary CTA 06/09/2020:  Unable to be performed secondary to heart rate -Calcium score 2.67 which places the patient in the 49th percentile for age and sex matched control __________   2D echo 05/30/2020: 1. Left ventricular ejection fraction, by estimation, is 30 to 35%. The  left ventricle has moderately decreased function. Left ventricular  endocardial border not optimally defined to evaluate regional wall motion.  Left ventricular diastolic parameters  are consistent with Grade II diastolic dysfunction (pseudonormalization).  2. Right ventricular systolic function was not well visualized. The right  ventricular size is normal. Tricuspid regurgitation signal is inadequate  for assessing PA pressure.  3. The mitral valve is normal in structure. Mild mitral valve  regurgitation. No evidence of mitral stenosis.  4. The aortic valve was not well visualized. Aortic valve regurgitation  is not visualized. No aortic stenosis is present.  5. The inferior vena cava is normal in size with greater than 50%  respiratory variability, suggesting right atrial pressure of 3  mmHg.  6. Challenging image quality. __________  2D echo 08/2019: 1. Left ventricular ejection fraction, by visual estimation, is 25 to  30%. The left ventricle has moderate to severely decreased function. There  is no left ventricular hypertrophy.  2. Left ventricular diastolic parameters are consistent with Grade I  diastolic dysfunction (impaired relaxation).  3.  The left ventricle demonstrates global hypokinesis.  4. Global right ventricle has normal systolic function.The right  ventricular size is normal. No increase in right ventricular wall  thickness.  5. Left atrial size was normal.  6. TR signal is inadequate for assessing pulmonary artery systolic  Pressure. __________  [facility] 08/2019: Coronary angiography:  Coronary dominance: Right  Left mainstem: Large vessel that bifurcates into the LAD and left circumflex, no significant disease noted  Left anterior descending (LAD): Large vessel that extends to the apical region, diagonal branch 2 of moderate size, mild proximal LAD disease estimated at 30%  Left circumflex (LCx): Large vessel with OM branch 2, no significant disease noted  Right coronary artery (RCA): Right dominant vessel with PL and PDA, 70% proximal RCA disease followed by sequential 50% lesion proximal to mid vessel  Left ventriculography: Left ventricular systolic function is normal, LVEF is estimated at 50 to 55%, there is no significant mitral regurgitation , no significant aortic valve stenosis  Right heart pressures RA 10 RV 29/8, 11 PA 27/16 mean 20 Wedge 12 Cardiac output 5.04 Cardiac index 2.51  Final Conclusions:  Single-vessel disease RCA Discussed with interventional cardiology, they will perform FFR to determine if intervention needed Right heart pressures are normal   Recommendations:  We will defer coronary intervention to interventional cardiology Needs weight loss, management of COPD Management of  hyperlipidemia __________  iFR 08/2019:  Conclusions: 1. Single vessel coronary with sequential 70% and 50% proximal and mid RCA stenoses that are not hemodynamically significant (iFR = 0.97). See Dr. Donivan Scull diagnostic catheterization report for further details of right/left heart catheterization.  Recommendations: 1. Medical therapy and risk factor modification to prevent progression of disease.    EKG:  EKG is ordered today.  The EKG ordered today demonstrates NSR, 91 bpm, occasional PVCs, LBBB, largely unchanged when compared to prior  Recent Labs: 08/14/2019: TSH 2.627 11/30/2019: ALT 48; Hemoglobin 13.2; Platelets 306 06/10/2020: Magnesium 1.9 06/15/2020: BUN 18; Creatinine, Ser 0.76; Potassium 4.4; Sodium 143  Recent Lipid Panel    Component Value Date/Time   CHOL 68 (L) 06/15/2020 1552   TRIG 127 06/15/2020 1552   HDL 35 (L) 06/15/2020 1552   CHOLHDL 1.5 11/30/2019 1135   CHOLHDL 1.7 01/01/2018 0000   VLDL 29 08/06/2016 0824   LDLCALC 10 06/15/2020 1552   LDLCALC 29 01/01/2018 0000    PHYSICAL EXAM:    VS:  BP 110/76 (BP Location: Right Arm, Patient Position: Sitting, Cuff Size: Normal)   Pulse 91   Ht 5' 3" (1.6 m)   Wt 235 lb (106.6 kg)   SpO2 96%   BMI 41.63 kg/m   BMI: Body mass index is 41.63 kg/m.  Physical Exam Constitutional:      Appearance: She is well-developed.  HENT:     Head: Normocephalic and atraumatic.  Eyes:     General:        Right eye: No discharge.        Left eye: No discharge.  Neck:     Vascular: No JVD.  Cardiovascular:     Rate and Rhythm: Normal rate and regular rhythm.     Pulses: No midsystolic click and no opening snap.          Dorsalis pedis pulses are 2+ on the right side and 2+ on the left side.       Posterior tibial pulses are 2+ on the right side and 2+ on the left side.     Heart  sounds: Normal heart sounds, S1 normal and S2 normal. Heart sounds not distant. No murmur heard.  No friction rub.  Pulmonary:      Effort: Pulmonary effort is normal. No respiratory distress.     Breath sounds: Normal breath sounds. No decreased breath sounds, wheezing or rales.  Chest:     Chest wall: No tenderness.  Abdominal:     General: There is no distension.     Palpations: Abdomen is soft.     Tenderness: There is no abdominal tenderness.  Musculoskeletal:     Cervical back: Normal range of motion.  Skin:    General: Skin is warm and dry.     Nails: There is no clubbing.  Neurological:     Mental Status: She is alert and oriented to person, place, and time.  Psychiatric:        Speech: Speech normal.        Behavior: Behavior normal.        Thought Content: Thought content normal.        Judgment: Judgment normal.     Wt Readings from Last 3 Encounters:  07/20/20 235 lb (106.6 kg)  07/12/20 233 lb 12.8 oz (106.1 kg)  06/15/20 237 lb (107.5 kg)     ASSESSMENT & PLAN:   1. HFrEF secondary to NICM with underlying LBBB: Volume status is difficult to assess on exam secondary to body habitus though she does appear euvolemic and well compensated.  Her weight is stable.  Recent coronary CTA demonstrated nonobstructive disease as outlined above.  Continue to escalate GDMT with titration of Toprol-XL to 37.5 mg daily.  Of note, she was previously intolerant to carvedilol.  Add spironolactone 12.5 mg daily.  She will otherwise continue losartan.  Not currently requiring a standing diuretic.  She does have an underlying left bundle.  We will continue escalate GDMT over the next several months with a plan to follow-up echo in early 2022.  If her cardiomyopathy persists at that time we will refer her to EP for consideration of CRT.  CHF education.  2. Nonobstructive CAD: No symptoms of chest pain.  Prior noted moderate RCA disease with normal iFR in 08/2019.  Subsequent coronary CTA performed earlier this month showed nonobstructive CAD.  Continue aggressive risk factor modification and medical therapy including  aspirin, Lipitor, metoprolol, and Imdur.  No indication for further ischemic testing at this time.  3. PVCs: Uncertain if her PVC burden is great enough to be contributing to her underlying cardiomyopathy.  Titrate Toprol-XL to 37.5 mg daily.  If with titrated dose of beta-blocker she continues to have PVCs on EKG at her visit next month we will consider placing a Zio patch to quantify PVC burden.  4. HLD: LDL of 10 from 05/2020 with normal AST and mildly elevated ALT.  Check LFT at lab visit next week.  She remains on reduced dose atorvastatin 10 mg daily.  She does have history of fatty liver.  5. COPD/restrictive lung disease: Stable and followed by pulmonology.  This is contributing to her overall presentation to some degree.  6. Sleep apnea: She has a history of sleep apnea and was on CPAP though lost her insurance and therefore no longer has her CPAP machine.  Her morbid obesity, OSA, and likely OHS are likely contributing to her overall presentation.  Recommend she follow-up with pulmonology for further recommendations regarding this.  7. Morbid obesity/decontining: Contributing to her overall presentation.  She did not tolerate cardiopulmonary rehab.  Weight loss is recommended.  Disposition: F/u with Dr. Rockey Situ or an APP in 1 month.   Medication Adjustments/Labs and Tests Ordered: Current medicines are reviewed at length with the patient today.  Concerns regarding medicines are outlined above. Medication changes, Labs and Tests ordered today are summarized above and listed in the Patient Instructions accessible in Encounters.   Signed, Christell Faith, PA-C 07/20/2020 9:29 AM     [facility] [address] [place],  [address] [phone]

## 2020-07-20 ENCOUNTER — Encounter: Payer: Self-pay | Admitting: Physician Assistant

## 2020-07-20 ENCOUNTER — Ambulatory Visit (INDEPENDENT_AMBULATORY_CARE_PROVIDER_SITE_OTHER): Payer: Medicare Other | Admitting: Physician Assistant

## 2020-07-20 ENCOUNTER — Other Ambulatory Visit: Payer: Self-pay

## 2020-07-20 VITALS — BP 110/76 | HR 91 | Ht 63.0 in | Wt 235.0 lb

## 2020-07-20 DIAGNOSIS — E785 Hyperlipidemia, unspecified: Secondary | ICD-10-CM

## 2020-07-20 DIAGNOSIS — G4733 Obstructive sleep apnea (adult) (pediatric): Secondary | ICD-10-CM

## 2020-07-20 DIAGNOSIS — R0609 Other forms of dyspnea: Secondary | ICD-10-CM

## 2020-07-20 DIAGNOSIS — Z79899 Other long term (current) drug therapy: Secondary | ICD-10-CM | POA: Diagnosis not present

## 2020-07-20 DIAGNOSIS — I493 Ventricular premature depolarization: Secondary | ICD-10-CM

## 2020-07-20 DIAGNOSIS — J410 Simple chronic bronchitis: Secondary | ICD-10-CM

## 2020-07-20 DIAGNOSIS — R0600 Dyspnea, unspecified: Secondary | ICD-10-CM

## 2020-07-20 DIAGNOSIS — I447 Left bundle-branch block, unspecified: Secondary | ICD-10-CM | POA: Diagnosis not present

## 2020-07-20 DIAGNOSIS — I5022 Chronic systolic (congestive) heart failure: Secondary | ICD-10-CM

## 2020-07-20 DIAGNOSIS — J984 Other disorders of lung: Secondary | ICD-10-CM

## 2020-07-20 DIAGNOSIS — I25119 Atherosclerotic heart disease of native coronary artery with unspecified angina pectoris: Secondary | ICD-10-CM

## 2020-07-20 DIAGNOSIS — I428 Other cardiomyopathies: Secondary | ICD-10-CM | POA: Diagnosis not present

## 2020-07-20 DIAGNOSIS — I2510 Atherosclerotic heart disease of native coronary artery without angina pectoris: Secondary | ICD-10-CM | POA: Diagnosis not present

## 2020-07-20 MED ORDER — SPIRONOLACTONE 25 MG PO TABS: 12.5000 mg | ORAL_TABLET | Freq: Every day | ORAL | 3 refills | Status: DC

## 2020-07-20 MED ORDER — METOPROLOL SUCCINATE ER 25 MG PO TB24: 37.5000 mg | ORAL_TABLET | Freq: Every day | ORAL | 3 refills | Status: DC

## 2020-07-20 NOTE — Patient Instructions (Signed)
Medication Instructions:   Your physician has recommended you make the following change in your medication:   1)  START taking spironolactone (ALDACTONE) 25 MG tablet: Take 0.5 tablets (12.5 mg total) by mouth daily. 2)  INCREASE your metoprolol succinate (TOPROL XL) 25 MG 24 hr tablet: Take 1.5 tablets (37.5 mg total) by mouth daily.  *If you need a refill on your cardiac medications before your next appointment, please call your pharmacy*   Lab Work:  Your physician recommends that you return for lab work in: 1 week.  - Please go to the [facility]. You will check in at the front desk to the right as you walk into the atrium. Valet Parking is offered if needed. - No appointment needed. You may go any day between 7 am and 6 pm.  If you have labs (blood work) drawn today and your tests are completely normal, you will receive your results only by: Marland Kitchen MyChart Message (if you have MyChart) OR . A paper copy in the mail If you have any lab test that is abnormal or we need to change your treatment, we will call you to review the results.   Testing/Procedures: None Ordered   Follow-Up: At [facility], you and your health needs are our priority.  As part of our continuing mission to provide you with exceptional heart care, we have created designated Provider Care Teams.  These Care Teams include your primary Cardiologist (physician) and Advanced Practice Providers (APPs -  Physician Assistants and Nurse Practitioners) who all work together to provide you with the care you need, when you need it.  We recommend signing up for the patient portal called "MyChart".  Sign up information is provided on this After Visit Summary.  MyChart is used to connect with patients for Virtual Visits (Telemedicine).  Patients are able to view lab/test results, encounter notes, upcoming appointments, etc.  Non-urgent messages can be sent to your provider as well.   To learn more about what you can do with  MyChart, go to [email or web address].    Your next appointment:   1 month(s)  The format for your next appointment:   In Person  Provider:   You may see Ida Rogue, MD or one of the following Advanced Practice Providers on your designated Care Team:     Christell Faith, [place]     Other Instructions

## 2020-07-21 LAB — BASIC METABOLIC PANEL
BUN/Creatinine Ratio: 17 (ref 12–28)
BUN: 13 mg/dL (ref 8–27)
CO2: 19 mmol/L — ABNORMAL LOW (ref 20–29)
Calcium: 8.8 mg/dL (ref 8.7–10.3)
Chloride: 107 mmol/L — ABNORMAL HIGH (ref 96–106)
Creatinine, Ser: 0.76 mg/dL (ref 0.57–1.00)
GFR calc Af Amer: 94 mL/min/{1.73_m2} (ref >59)
GFR calc non Af Amer: 81 mL/min/{1.73_m2} (ref >59)
Glucose: 133 mg/dL — ABNORMAL HIGH (ref 65–99)
Potassium: 4.3 mmol/L (ref 3.5–5.2)
Sodium: 143 mmol/L (ref 134–144)

## 2020-07-22 DIAGNOSIS — G4730 Sleep apnea, unspecified: Secondary | ICD-10-CM | POA: Diagnosis not present

## 2020-07-22 DIAGNOSIS — R0683 Snoring: Secondary | ICD-10-CM | POA: Diagnosis not present

## 2020-07-27 ENCOUNTER — Other Ambulatory Visit
Admission: RE | Admit: 2020-07-27 | Discharge: 2020-07-27 | Disposition: A | Discharge status: Home / Self Care | Payer: Medicare Other | Attending: Physician Assistant | Admitting: Physician Assistant

## 2020-07-27 DIAGNOSIS — Z79899 Other long term (current) drug therapy: Secondary | ICD-10-CM

## 2020-07-27 LAB — BASIC METABOLIC PANEL
Anion gap: 10 (ref 5–15)
BUN: 19 mg/dL (ref 8–23)
CO2: 24 mmol/L (ref 22–32)
Calcium: 9 mg/dL (ref 8.9–10.3)
Chloride: 104 mmol/L (ref 98–111)
Creatinine, Ser: 1.1 mg/dL — ABNORMAL HIGH (ref 0.44–1.00)
GFR calc non Af Amer: 52 mL/min — ABNORMAL LOW (ref >60)
Glucose, Bld: 187 mg/dL — ABNORMAL HIGH (ref 70–99)
Potassium: 4.1 mmol/L (ref 3.5–5.1)
Sodium: 138 mmol/L (ref 135–145)

## 2020-07-29 ENCOUNTER — Ambulatory Visit: Payer: Medicare Other | Admitting: Physician Assistant

## 2020-08-13 NOTE — Progress Notes (Signed)
Cardiology Office Note    Date:  08/22/2020   ID:  SHERON TALLMAN, DOB [birth year], MRN [id number]  PCP:  Venita Lick, NP  Cardiologist:  Ida Rogue, MD  Electrophysiologist:  None   Chief Complaint: Follow up  History of Present Illness:   Kim Buchanan is a 69 y.o. female with history of nonobstructive CAD, HFrEF secondary toNICM,COPD secondary to tobacco use quitting in 11/2018,restrictive lung disease,DM2, HLD, chronic dyspnea, obesity, untreated sleep apnea, GERD, anxiety, and depression who presents for follow-up of her cardiomyopathy.  She was previously followed by Dr. Jenita Seashore underwent echo in 03/2019 which showed normal LV systolic function. Stress testing was undertaken in the setting of ongoing dyspnea which showed anterior ischemia. Following this, there was recommendation for coronary CTA though the patient did not follow-up and was subsequently evaluated by pulmonology. In this setting,she was referred to Dr. Rockey Situ who saw the patient in 07/2019 for follow-up of the above prior cardiac testing. Records were not available at that time, therefore, she underwent repeat echo in 08/2019, which revealed an EF of 25 to 30%, global hypokinesis, and grade 1 diastolic dysfunction. There was no significant valvular disease. Given her new cardiomyopathy, she underwent diagnostic cardiac cath in 08/2019 which revealed a 70% stenosis in the proximal RCA. iFR was normal at 0.97. Medical therapy was recommended. Of note, LV gram was performed and showed an EF of 50 to 55%. Right heart cath showed normal filling pressures. She was seen in the office in 09/2019 and was doing well from a cardiac perspective. She was noted to have resolution of cough following discontinuation of losartan. She was started on low-dose carvedilol and referred to cardiopulmonary rehab.   She was seen in the office on 05/19/2020, and noted stable symptoms over the past couple of years. She  followed up with pulmonology with lung disease felt to be less likely the etiology of her symptoms, with mild restrictive physiology noted on PFTs. She was unable to participate in cardiopulmonary rehab secondary to dyspnea. It was noted she did not tolerate Coreg secondary to cough and self discontinued it. She underwent echo, per pulmonology on 05/30/2020, which showed an EF of 30-35%, Gr2DD, normal RV cavity size, and mild MR.She was scheduled for coronary CTA on 06/09/2020, whichwas unable to be performed secondary to heart rates.She was able to undergo calcium scoring with a calcium score of 2.67 which placed the patient in the 49th percentile for age and sex matched control.  She was seen in follow up on 06/10/2020 and continued to feel about the same with ongoing exertional dyspnea.  She subsequently underwent coronary CTA on 06/30/2020 which showed nonobstructive CAD as outlined below.  In this setting, she was started on losartan to further optimize her medical therapy.  She was seen by pulmonology on 07/12/2020, in follow up with noted minimal obstructive, if any, and mild restrictive lung disease.  She was last seen in the office on 07/20/2020 and continued to feel about the same with stable exertional dyspnea that has been present for the past several years.  She did not notice any changes in her dyspnea following the addition of inhaler therapy by pulmonology.  With continued PVCs, Toprol-XL was titrated to 37.5 mg daily.  She was also started on spironolactone 12.5 mg.  She comes in accompanied by her husband today and overall has felt about the same as she has over her past several visits.  However, over the past 3 days that she  has noted some increase in chest congestion with shortness of breath and a nonproductive cough.  No chest pain, palpitations, lower extremity swelling, orthopnea, PND, or early satiety.  She does feel like her abdomen is more distended.  She denies any changes in her diet and  really tries to watch her salt intake.  She drinks approximately 2 L of fluid per day.  No fever or chills.  She does not feel any nasal congestion or postnasal drip.  No sore throat.  Labs independently reviewed: 07/2020 - potassium 4.1, BUN 19, serum creatinine 1.10 05/2020 - TC 68, TG 127, HDL 35, LDL 10, A1c 6.7, magnesium 1.9 11/2019-Hgb 13.2, PLT 306, albumin 4.0, AST normal, ALT 48 07/2019-TSH normal   Past Medical History:  Diagnosis Date  . Anxiety   . Arthritis    lower back  . COPD (chronic obstructive pulmonary disease) ([place])   . Depression   . Diabetes mellitus without complication ([place])   . GERD (gastroesophageal reflux disease)   . Hyperlipidemia   . Morbid obesity ([place])   . NICM (nonischemic cardiomyopathy) ([place])    a. 03/2019 Echo: Nl LVEF; b. 09/04/2019 Echo: EF 25-30%, Gr1 DD, glob HK, nl RV fxn; c. 09/11/2019 Cath: nonobs LAD/RCA dzs. EF 50-55%. RA 10, RV 29/8(11), PA 27/16(20), PCWP 12. CO/CI 5.04/2.51.  Marland Kitchen Nonobstructive CAD (coronary artery disease)    a. 03/2019 [facility]): EF 63%, ant ischemia; b. 08/2019 Cath: LM nl, LAD 52m RCA 70p (iFR nl @ 0.97), 50p. EF 50-55%-->Med Rx.  . Osteoporosis   . Panic attack   . PTSD (post-traumatic stress disorder)   . Rectal fistula   . Sleep apnea    a. Prev wore CPAP but lost insurance and hasn't been using since (now has insurance).  . Wears dentures    full upper    Past Surgical History:  Procedure Laterality Date  . ABDOMINAL HYSTERECTOMY    . COLONOSCOPY  2014  . COLONOSCOPY WITH PROPOFOL N/A 10/01/2016   Procedure: COLONOSCOPY WITH PROPOFOL;  Surgeon: DLucilla Lame MD;  [facility]  Service: Endoscopy;  Laterality: N/A;  . CORONARY PRESSURE WIRE/FFR WITH 3D MAPPING N/A 09/11/2019   Procedure: Coronary Pressure Wire/FFR w/3D Mapping;  Surgeon: ENelva Bush MD;  [facility];  Service: Cardiovascular;  Laterality: N/A;  . CYSTOCELE REPAIR N/A 12/17/2016   Procedure:  ANTERIOR REPAIR (CYSTOCELE);  Surgeon: MBrayton Mars MD;  Location: [facility];  Service: Gynecology;  Laterality: N/A;  . ESOPHAGOGASTRODUODENOSCOPY (EGD) WITH PROPOFOL N/A 08/11/2019   Procedure: ESOPHAGOGASTRODUODENOSCOPY (EGD) WITH PROPOFOL;  Surgeon: WLucilla Lame MD;  Location: [facility];  Service: Endoscopy;  Laterality: N/A;  . POLYPECTOMY  10/01/2016   Procedure: POLYPECTOMY;  Surgeon: DLucilla Lame MD;  [facility]  Service: Endoscopy;;  . RECTAL SURGERY  06/28/2015   Rectal prolapse, laparoscopic rectopexy the coldCharmont, MD; [facility]  . RIGHT/LEFT HEART CATH AND CORONARY ANGIOGRAPHY Bilateral 09/11/2019   Procedure: RIGHT/LEFT HEART CATH AND CORONARY ANGIOGRAPHY;  Surgeon: GMinna Merritts MD;  [facility];  Service: Cardiovascular;  Laterality: Bilateral;  . TUBAL LIGATION    . VAGINAL HYSTERECTOMY Bilateral 12/17/2016   Procedure: HYSTERECTOMY VAGINAL WITH BILATERAL SALPINGO OOPHERECTOMY;  Surgeon: MBrayton Mars MD;  Location: [facility];  Service: Gynecology;  Laterality: Bilateral;    Current Medications: Current Meds  Medication Sig  . aspirin 81 MG tablet Take 81 mg by mouth daily.   .Marland Kitchenatorvastatin (LIPITOR) 10 MG tablet Take  1 tablet (10 mg total) by mouth at bedtime.  . blood glucose meter kit and supplies Dispense based on patient and insurance preference. Check fasting blood sugar once daily. (FOR ICD E11.69).  . dapagliflozin propanediol (FARXIGA) 5 MG TABS tablet Take 1 tablet (5 mg total) by mouth daily before breakfast.  . gabapentin (NEURONTIN) 300 MG capsule Take 1 capsule (300 mg total) by mouth 3 (three) times daily.  . isosorbide mononitrate (IMDUR) 30 MG 24 hr tablet TAKE 1 TABLET BY MOUTH  DAILY  . Lancets (ONETOUCH DELICA PLUS [id number]) MISC daily.   Marland Kitchen losartan (COZAAR) 25 MG tablet Take 0.5 tablets (12.5 mg total) by mouth daily.  . metFORMIN (GLUCOPHAGE) 500 MG tablet Take 1 tablet (500 mg total) by mouth  2 (two) times daily with a meal.  . metoprolol succinate (TOPROL XL) 25 MG 24 hr tablet Take 1.5 tablets (37.5 mg total) by mouth daily.  Glory Rosebush ULTRA test strip 1 each daily.  . pantoprazole (PROTONIX) 40 MG tablet Take 40 mg by mouth daily.   . polyethylene glycol powder (GLYCOLAX/MIRALAX) 17 GM/SCOOP powder Take 1-2 doses daily as needed for constipation/to prevent constipation  . QUEtiapine (SEROQUEL) 100 MG tablet Take 1 tablet (100 mg total) by mouth at bedtime.  Marland Kitchen spironolactone (ALDACTONE) 25 MG tablet Take 0.5 tablets (12.5 mg total) by mouth daily.    Allergies:   Lisinopril   Social History   Socioeconomic History  . Marital status: Married    Spouse name: Paediatric nurse  . Number of children: 1  . Years of education: Not on file  . Highest education level: 9th grade  Occupational History    Employer: DISABLED    Comment: for panic attacks  Tobacco Use  . Smoking status: Former Smoker    Packs/day: 0.50    Years: 47.00    Pack years: 23.50    Types: Cigarettes    Quit date: 11/29/2018    Years since quitting: 1.7  . Smokeless tobacco: Never Used  . Tobacco comment: started at age 18  Vaping Use  . Vaping Use: Never used  Substance and Sexual Activity  . Alcohol use: No    Alcohol/week: 0.0 standard drinks  . Drug use: No  . Sexual activity: Not Currently  Other Topics Concern  . Not on file  Social History Narrative   Pt had one son, who died when was 37 YO in a MVA.   Social Determinants of Health   Financial Resource Strain: Low Risk   . Difficulty of Paying Living Expenses: Not hard at all  Food Insecurity: No Food Insecurity  . Worried About Charity fundraiser in the Last Year: Never true  . Ran Out of Food in the Last Year: Never true  Transportation Needs: No Transportation Needs  . Lack of Transportation (Medical): No  . Lack of Transportation (Non-Medical): No  Physical Activity: Inactive  . Days of Exercise per Week: 0 days  . Minutes of Exercise  per Session: 0 min  Stress: Stress Concern Present  . Feeling of Stress : Very much  Social Connections: Socially Isolated  . Frequency of Communication with Friends and Family: Twice a week  . Frequency of Social Gatherings with Friends and Family: Never  . Attends Religious Services: Never  . Active Member of Clubs or Organizations: No  . Attends Archivist Meetings: Never  . Marital Status: Married     Family History:  The patient's family history includes Cancer in  her sister; Diabetes in her brother, brother, mother, sister, and sister; Healthy in her sister; Heart disease in her brother and father; Hypertension in her father; Lymphoma in her brother and brother. There is no history of Breast cancer, Ovarian cancer, or Colon cancer.  ROS:   Review of Systems  Constitutional: Positive for malaise/fatigue. Negative for chills, diaphoresis, fever and weight loss.  HENT: Positive for congestion. Negative for sore throat.   Eyes: Negative for discharge and redness.  Respiratory: Positive for shortness of breath. Negative for cough, sputum production and wheezing.   Cardiovascular: Negative for chest pain, palpitations, orthopnea, claudication, leg swelling and PND.  Gastrointestinal: Negative for abdominal pain, heartburn, nausea and vomiting.  Musculoskeletal: Negative for falls and myalgias.  Skin: Negative for rash.  Neurological: Positive for weakness. Negative for dizziness, tingling, tremors, sensory change, speech change, focal weakness and loss of consciousness.  Endo/Heme/Allergies: Does not bruise/bleed easily.  Psychiatric/Behavioral: Negative for substance abuse. The patient is not nervous/anxious.   All other systems reviewed and are negative.    EKGs/Labs/Other Studies Reviewed:    Studies reviewed were summarized above. The additional studies were reviewed today:  Coronary CTA 06/30/2020: Aorta: Normal size. Mild aortic atherosclerosis. No  dissection.  Aortic Valve: Trileaflet. No calcifications.  Coronary Arteries: Normal coronary origin. Right dominance.  RCA is a large dominant artery that gives rise to PDA and PLVB. There is mild (25-49%) soft plaque stenosis in the proximal to mid vessel followed by mild (25-49%) mixed plaque stenosis.  Left main is a large artery that gives rise to LAD and LCX arteries.  LAD is a large vessel that gives rise to medium size D1 and D2 and small D3; there is long, soft plaque stenosis in the mid vessel mild (25-49%) followed by moderate (50-69%).  LCX is a non-dominant artery that gives rise to one large OM1 branch. There is no plaque.  Other findings:  Normal pulmonary vein drainage into the left atrium.  Normal let atrial appendage without a thrombus.  Normal size of the pulmonary artery.  IMPRESSION: 1. Coronary calcium score of 39.3. This was 32 percentile for age and sex matched control.  2. Normal coronary origin with right dominance.  3. Moderate (50-69%) soft plaque stenosis in the mid LAD; mild (25-49%) soft and mixed plaque stenoses in the proximal to mid RCA.  4. Study will be sent for FFR. __________  FFR 07/01/2020: 1. Left Main: findings 0.99, 0.99  2. LAD: findings 0.99, 0.97 0.92  3. OM: Findings 0.98, 0.88 0.84  4. RCA: findings 0.97, 0.81 0.84  IMPRESSION: FFR not suggestive of significant obstructive disease. __________  Coronary CTA 06/09/2020: Unable to be performed secondary to heart rate -Calcium score 2.67 which places the patient in the 49th percentile for age and sex matched control __________   2D echo 05/30/2020: 1. Left ventricular ejection fraction, by estimation, is 30 to 35%. The  left ventricle has moderately decreased function. Left ventricular  endocardial border not optimally defined to evaluate regional wall motion.  Left ventricular diastolic parameters  are consistent with Grade II diastolic  dysfunction (pseudonormalization).  2. Right ventricular systolic function was not well visualized. The right  ventricular size is normal. Tricuspid regurgitation signal is inadequate  for assessing PA pressure.  3. The mitral valve is normal in structure. Mild mitral valve  regurgitation. No evidence of mitral stenosis.  4. The aortic valve was not well visualized. Aortic valve regurgitation  is not visualized. No aortic stenosis is present.  5. The inferior vena cava is normal in size with greater than 50%  respiratory variability, suggesting right atrial pressure of 3 mmHg.  6. Challenging image quality. __________  2D echo 08/2019: 1. Left ventricular ejection fraction, by visual estimation, is 25 to  30%. The left ventricle has moderate to severely decreased function. There  is no left ventricular hypertrophy.  2. Left ventricular diastolic parameters are consistent with Grade I  diastolic dysfunction (impaired relaxation).  3. The left ventricle demonstrates global hypokinesis.  4. Global right ventricle has normal systolic function.The right  ventricular size is normal. No increase in right ventricular wall  thickness.  5. Left atrial size was normal.  6. TR signal is inadequate for assessing pulmonary artery systolic  Pressure. __________  [facility] 08/2019: Coronary angiography:  Coronary dominance: Right  Left mainstem: Large vessel that bifurcates into the LAD and left circumflex, no significant disease noted  Left anterior descending (LAD): Large vessel that extends to the apical region, diagonal branch 2 of moderate size, mild proximal LAD disease estimated at 30%  Left circumflex (LCx): Large vessel with OM branch 2, no significant disease noted  Right coronary artery (RCA): Right dominant vessel with PL and PDA, 70% proximal RCA disease followed by sequential 50% lesion proximal to mid vessel  Left ventriculography: Left ventricular systolic  function is normal, LVEF is estimated at 50 to 55%, there is no significant mitral regurgitation , no significant aortic valve stenosis  Right heart pressures RA 10 RV 29/8, 11 PA 27/16 mean 20 Wedge 12 Cardiac output 5.04 Cardiac index 2.51  Final Conclusions:  Single-vessel disease RCA Discussed with interventional cardiology, they will perform FFR to determine if intervention needed Right heart pressures are normal   Recommendations:  We will defer coronary intervention to interventional cardiology Needs weight loss, management of COPD Management of hyperlipidemia __________  iFR 08/2019:  Conclusions: 1. Single vessel coronary with sequential 70% and 50% proximal and mid RCA stenoses that are not hemodynamically significant (iFR = 0.97). See Dr. Donivan Scull diagnostic catheterization report for further details of right/left heart catheterization.  Recommendations: 1. Medical therapy and risk factor modification to prevent progression of disease.   EKG:  EKG is ordered today.  The EKG ordered today demonstrates NSR, 96 bpm, LBBB (known)  Recent Labs: 11/30/2019: ALT 48; Hemoglobin 13.2; Platelets 306 06/10/2020: Magnesium 1.9 07/27/2020: BUN 19; Creatinine, Ser 1.10; Potassium 4.1; Sodium 138  Recent Lipid Panel    Component Value Date/Time   CHOL 68 (L) 06/15/2020 1552   TRIG 127 06/15/2020 1552   HDL 35 (L) 06/15/2020 1552   CHOLHDL 1.5 11/30/2019 1135   CHOLHDL 1.7 01/01/2018 0000   VLDL 29 08/06/2016 0824   LDLCALC 10 06/15/2020 1552   LDLCALC 29 01/01/2018 0000    PHYSICAL EXAM:    VS:  BP 110/60 (BP Location: Right Arm, Patient Position: Sitting, Cuff Size: Normal)   Pulse 96   Ht '5\' 3"'  (1.6 m)   Wt 235 lb (106.6 kg)   SpO2 96%   BMI 41.63 kg/m   BMI: Body mass index is 41.63 kg/m.  Physical Exam Constitutional:      Appearance: She is well-developed.  HENT:     Head: Normocephalic and atraumatic.  Eyes:     General:        Right eye: No  discharge.        Left eye: No discharge.  Neck:     Vascular: No JVD.  Cardiovascular:  Rate and Rhythm: Normal rate and regular rhythm.     Pulses: No midsystolic click and no opening snap.          Dorsalis pedis pulses are 2+ on the right side and 2+ on the left side.       Posterior tibial pulses are 2+ on the right side and 2+ on the left side.     Heart sounds: Normal heart sounds, S1 normal and S2 normal. Heart sounds not distant. No murmur heard.  No friction rub.  Pulmonary:     Effort: Pulmonary effort is normal. No respiratory distress.     Breath sounds: Examination of the right-lower field reveals rales. Examination of the left-lower field reveals rales. Rales present. No decreased breath sounds, wheezing or rhonchi.  Chest:     Chest wall: No tenderness.  Abdominal:     General: There is distension.     Palpations: Abdomen is soft.     Tenderness: There is no abdominal tenderness.  Musculoskeletal:     Cervical back: Normal range of motion.  Skin:    General: Skin is warm and dry.     Nails: There is no clubbing.  Neurological:     Mental Status: She is alert and oriented to person, place, and time.  Psychiatric:        Speech: Speech normal.        Behavior: Behavior normal.        Thought Content: Thought content normal.        Judgment: Judgment normal.     Wt Readings from Last 3 Encounters:  08/22/20 235 lb (106.6 kg)  07/20/20 235 lb (106.6 kg)  07/12/20 233 lb 12.8 oz (106.1 kg)     ASSESSMENT & PLAN:   1. Acute on chronic HFrEF secondary to NICM with underlying LBBB: She has NYHA class III symptoms.  Volume status is difficult to assess on physical exam secondary to body habitus though there are some bilateral bibasilar crackles on exam with a mildly distended abdomen.  Weight is stable when compared to her last visit.  Add Lasix 20 mg daily for the next 5 days followed by discontinuation of furosemide thereafter.  Continue Toprol-XL, losartan, and  spironolactone.  Check BMP in 1 week following outpatient diuresis.  When we see her back in 2 weeks consider further escalation of GDMT.  Plan for follow-up limited echo in early 2022.  If her cardiomyopathy persists at that time, given her underlying LBBB, she will be referred to EP for consideration of CRT-D.  CHF education.  2. Nonobstructive CAD: No symptoms of chest pain.  Prior noted moderate RCA disease with normal IFR in 08/2019.  Subsequent coronary CTA in 06/2020 showed nonobstructive CAD.  Continue aggressive factor modification and medical therapy including aspirin, atorvastatin, metoprolol succinate, and isosorbide mononitrate.  No indication for further ischemic testing at this time.  3. PVCs: Appears to be improved following titration of metoprolol with no noted ventricular ectopy on twelve-lead EKG in the office today.  Continue Toprol-XL 37.5 mg daily.  4. HLD: LDL of 10.  She remains on atorvastatin with history of fatty liver disease.  5. COPD/restrictive lung disease: Stable and followed by pulmonology.  6. Sleep apnea: She has a history of sleep apnea and was on CPAP though lost her insurance and therefore no longer has her CPAP machine.  Her morbid obesity, OSA, and likely OHS are likely contributing to her overall presentation.  Recommend she follow-up with pulmonology for  further recommendations regarding this.  7. Morbid obesity/deconditioning: Contributing to her overall presentation.  Intolerant to cardiopulmonary rehab.  Weight loss is recommended.  Disposition: F/u with Dr. Rockey Situ or an APP in 2 weeks.   Medication Adjustments/Labs and Tests Ordered: Current medicines are reviewed at length with the patient today.  Concerns regarding medicines are outlined above. Medication changes, Labs and Tests ordered today are summarized above and listed in the Patient Instructions accessible in Encounters.   Signed, Christell Faith, PA-C 08/22/2020 11:44 AM     [place] [address] [employer], [place] [address] [phone]

## 2020-08-16 ENCOUNTER — Other Ambulatory Visit: Payer: Self-pay

## 2020-08-16 MED ORDER — METOPROLOL SUCCINATE ER 25 MG PO TB24
37.5000 mg | ORAL_TABLET | Freq: Every day | ORAL | 3 refills | Status: DC
Start: 2020-08-16 — End: 2020-11-10

## 2020-08-16 MED ORDER — LOSARTAN POTASSIUM 25 MG PO TABS: 12.5000 mg | ORAL_TABLET | Freq: Every day | ORAL | 3 refills | Status: DC

## 2020-08-22 ENCOUNTER — Ambulatory Visit (INDEPENDENT_AMBULATORY_CARE_PROVIDER_SITE_OTHER): Payer: Medicare Other | Admitting: Physician Assistant

## 2020-08-22 ENCOUNTER — Other Ambulatory Visit: Payer: Self-pay

## 2020-08-22 ENCOUNTER — Encounter: Payer: Self-pay | Admitting: Physician Assistant

## 2020-08-22 VITALS — BP 110/60 | HR 96 | Ht 63.0 in | Wt 235.0 lb

## 2020-08-22 DIAGNOSIS — I447 Left bundle-branch block, unspecified: Secondary | ICD-10-CM

## 2020-08-22 DIAGNOSIS — I2510 Atherosclerotic heart disease of native coronary artery without angina pectoris: Secondary | ICD-10-CM

## 2020-08-22 DIAGNOSIS — I493 Ventricular premature depolarization: Secondary | ICD-10-CM

## 2020-08-22 DIAGNOSIS — I428 Other cardiomyopathies: Secondary | ICD-10-CM

## 2020-08-22 DIAGNOSIS — Z79899 Other long term (current) drug therapy: Secondary | ICD-10-CM

## 2020-08-22 DIAGNOSIS — J984 Other disorders of lung: Secondary | ICD-10-CM

## 2020-08-22 DIAGNOSIS — I5043 Acute on chronic combined systolic (congestive) and diastolic (congestive) heart failure: Secondary | ICD-10-CM | POA: Diagnosis not present

## 2020-08-22 DIAGNOSIS — J410 Simple chronic bronchitis: Secondary | ICD-10-CM

## 2020-08-22 DIAGNOSIS — R0602 Shortness of breath: Secondary | ICD-10-CM | POA: Diagnosis not present

## 2020-08-22 DIAGNOSIS — G4733 Obstructive sleep apnea (adult) (pediatric): Secondary | ICD-10-CM

## 2020-08-22 DIAGNOSIS — E785 Hyperlipidemia, unspecified: Secondary | ICD-10-CM

## 2020-08-22 MED ORDER — FUROSEMIDE 20 MG PO TABS: 20.0000 mg | ORAL_TABLET | Freq: Every day | ORAL | 0 refills | Status: DC

## 2020-08-22 NOTE — Patient Instructions (Signed)
Medication Instructions:  Your physician has recommended you make the following change in your medication:  1- TAKE Furosemide 20 mg  (1 tablet) by mouth once a day for 5 days.  *If you need a refill on your cardiac medications before your next appointment, please call your pharmacy*   Lab Work: Your physician recommends that you return for lab work in: 1 week for BMET at the [facility]. - on 08/29/20. - Please go to the [facility]. You will check in at the front desk to the right as you walk into the atrium. Valet Parking is offered if needed. - No appointment needed. You may go any day between 7 am and 6 pm.  If you have labs (blood work) drawn today and your tests are completely normal, you will receive your results only by: Marland Kitchen MyChart Message (if you have MyChart) OR . A paper copy in the mail If you have any lab test that is abnormal or we need to change your treatment, we will call you to review the results.   Testing/Procedures: none  Follow-Up: At [facility], you and your health needs are our priority.  As part of our continuing mission to provide you with exceptional heart care, we have created designated Provider Care Teams.  These Care Teams include your primary Cardiologist (physician) and Advanced Practice Providers (APPs -  Physician Assistants and Nurse Practitioners) who all work together to provide you with the care you need, when you need it.  We recommend signing up for the patient portal called "MyChart".  Sign up information is provided on this After Visit Summary.  MyChart is used to connect with patients for Virtual Visits (Telemedicine).  Patients are able to view lab/test results, encounter notes, upcoming appointments, etc.  Non-urgent messages can be sent to your provider as well.   To learn more about what you can do with MyChart, go to [email or web address].    Your next appointment:   2 week(s)  The format for your next appointment:   In  Person  Provider:   You may see Ida Rogue, MD or one of the following Advanced Practice Providers on your designated Care Team:    Murray Hodgkins, NP  Christell Faith, PA-C  Marrianne Mood, PA-C  [place], [place]

## 2020-08-24 ENCOUNTER — Telehealth: Payer: Medicare Other

## 2020-08-24 ENCOUNTER — Telehealth: Payer: Self-pay | Admitting: General Practice

## 2020-08-24 NOTE — Telephone Encounter (Signed)
  Chronic Care Management   Outreach Note  08/24/2020 Name: Kim Buchanan MRN: [id number] DOB: 1951/05/22  Referred by: Venita Lick, NP Reason for referral : Chronic Care Management (RNCM Initial outreach: 2nd attempt for Chronic Disease Management and Care Coordination Needs)   A second unsuccessful telephone outreach was attempted today. The patient was referred to the case management team for assistance with care management and care coordination.   Follow Up Plan: A HIPAA compliant phone message was left for the patient providing contact information and requesting a return call.   Noreene Larsson RN, MSN, [facility] Mobile: [phone]

## 2020-08-25 ENCOUNTER — Other Ambulatory Visit: Payer: Self-pay | Admitting: Family Medicine

## 2020-08-25 DIAGNOSIS — I25118 Atherosclerotic heart disease of native coronary artery with other forms of angina pectoris: Secondary | ICD-10-CM

## 2020-08-30 ENCOUNTER — Telehealth: Payer: Self-pay

## 2020-08-30 ENCOUNTER — Other Ambulatory Visit: Payer: Self-pay

## 2020-08-30 ENCOUNTER — Other Ambulatory Visit
Admission: RE | Admit: 2020-08-30 | Discharge: 2020-08-30 | Disposition: A | Discharge status: Home / Self Care | Payer: Medicare Other | Attending: Physician Assistant | Admitting: Physician Assistant

## 2020-08-30 DIAGNOSIS — R0602 Shortness of breath: Secondary | ICD-10-CM | POA: Diagnosis not present

## 2020-08-30 DIAGNOSIS — I5043 Acute on chronic combined systolic (congestive) and diastolic (congestive) heart failure: Secondary | ICD-10-CM

## 2020-08-30 DIAGNOSIS — Z79899 Other long term (current) drug therapy: Secondary | ICD-10-CM

## 2020-08-30 LAB — BASIC METABOLIC PANEL
Anion gap: 11 (ref 5–15)
BUN: 18 mg/dL (ref 8–23)
CO2: 26 mmol/L (ref 22–32)
Calcium: 8.9 mg/dL (ref 8.9–10.3)
Chloride: 104 mmol/L (ref 98–111)
Creatinine, Ser: 0.75 mg/dL (ref 0.44–1.00)
GFR, Estimated: >60 mL/min (ref >60)
Glucose, Bld: 142 mg/dL — ABNORMAL HIGH (ref 70–99)
Potassium: 4.2 mmol/L (ref 3.5–5.1)
Sodium: 141 mmol/L (ref 135–145)

## 2020-08-30 MED ORDER — FUROSEMIDE 20 MG PO TABS: 20.0000 mg | ORAL_TABLET | Freq: Every day | ORAL | 1 refills | Status: DC | PRN

## 2020-08-30 NOTE — Telephone Encounter (Signed)
-----   Message from Rise Mu, PA-C sent at 08/30/2020 10:11 AM EST ----- Renal function is normal and potassium is at goal following short course of Lasix. Ok to take Lasix 20 mg daily prn weight gain > 3 pounds overnight, > 5 pounds in a week or worsening SOB. Follow up as planned.

## 2020-08-30 NOTE — Telephone Encounter (Signed)
Patient made aware of lab results and Christell Faith, PA recommendation. Patient verbalized understanding. Refill for Lasix 20 mg daily prn with RDs instructions has been sent to the patient pharmacy.

## 2020-09-04 NOTE — Progress Notes (Signed)
Cardiology Office Note    Date:  09/07/2020   ID:  Kim Buchanan, DOB [birth year], MRN [id number]  PCP:  Venita Lick, NP  Cardiologist:  Ida Rogue, MD  Electrophysiologist:  None   Chief Complaint: Follow up  History of Present Illness:   Kim Buchanan is a 69 y.o. female with history of nonobstructive CAD, HFrEF secondary toNICM,COPD secondary to tobacco use quitting in 11/2018,restrictive lung disease,DM2, HLD, chronic dyspnea, obesity, untreated sleep apnea, GERD, anxiety, and depression who presents for follow-up of her cardiomyopathy.  She was previously followed by Dr. Jenita Seashore underwent echo in 03/2019 which showed normal LV systolic function. Stress testing was undertaken in the setting of ongoing dyspnea which showed anterior ischemia. Following this, there was recommendation for coronary CTA though the patient did not follow-up and was subsequently evaluated by pulmonology. In this setting,she was referred to Dr. Rockey Situ who saw the patient in 07/2019 for follow-up of the above prior cardiac testing. Records were not available at that time, therefore, she underwent repeat echo in 08/2019, which revealed an EF of 25 to 30%, global hypokinesis, and grade 1 diastolic dysfunction. There was no significant valvular disease. Given her new cardiomyopathy, she underwent diagnostic cardiac cath in 08/2019 which revealed a 70% stenosis in the proximal RCA. iFR was normal at 0.97. Medical therapy was recommended. Of note, LV gram was performed and showed an EF of 50 to 55%. Right heart cath showed normal filling pressures. She was seen in the office in 09/2019 and was doing well from a cardiac perspective. She was noted to have resolution of cough following discontinuation of losartan. She was started on low-dose carvedilol and referred to cardiopulmonary rehab.   She was seen in the office on 05/19/2020, and noted stable symptoms over the past couple of years.  She followed up with pulmonology with lung disease felt to be less likely the etiology of her symptoms, with mild restrictive physiology noted on PFTs. She was unable to participate in cardiopulmonary rehab secondary to dyspnea. It was noted she did not tolerate Coreg secondary to cough and self discontinued it. She underwent echo, per pulmonology on 05/30/2020, which showed an EF of 30-35%, Gr2DD, normal RV cavity size, and mild MR.She was scheduled for coronary CTA on 06/09/2020, whichwas unable to be performed secondary to heart rates.She was able to undergo calcium scoring with a calcium score of 2.67 which placed the patient in the 49th percentile for age and sex matched control.She was seen in follow up on 06/10/2020 and continued to feel about the same with ongoing exertional dyspnea. She subsequently underwent coronary CTA on 06/30/2020 which showed nonobstructive CAD as outlined below. In this setting, she was started on losartan to further optimize her medical therapy. She was seen by pulmonology on 07/12/2020, in follow up with noted minimal obstructive, if any, and mild restrictive lung disease.  She was seen in the office on 07/20/2020 and continued to feel about the same with stable exertional dyspnea that has been present for the past several years.  She did not notice any changes in her dyspnea following the addition of inhaler therapy by pulmonology.  With continued PVCs, Toprol-XL was titrated to 37.5 mg daily.  She was also started on spironolactone 12.5 mg.  She was last seen in the office on 08/22/2020 noting a 3-day history of increased chest congestion, shortness of breath, and nonproductive cough.  Her weight was stable.  Lasix 20 mg daily for 5 days was added.  She comes in accompanied by her husband today.  She did note an improvement in her dyspnea while she was on Lasix 20 mg daily.  Following discontinuation of this medication she noted a return of her dyspnea and exertional  fatigue.  No chest pain.  No lower extremity swelling.  She has stable two-pillow orthopnea.  She does consume a diet high in sodium including canned vegetables that are not rinsed as well as cold cut meats including bologna.  She is drinking less than 2 L of fluid per day.  Her weight has remained stable at home.  She is adherent and tolerating all of her medications.  No dizziness, presyncope, or syncope.   Labs independently reviewed: 08/2020 - potassium 4.2, BUN 18, serum creatinine 0.75 05/2020 - TC 68, TG 127, HDL 35, LDL 10, A1c 6.7, magnesium 1.9 11/2019-Hgb 13.2, PLT 306, albumin 4.0, AST normal, ALT 48 07/2019-TSH normal    Past Medical History:  Diagnosis Date  . Anxiety   . Arthritis    lower back  . COPD (chronic obstructive pulmonary disease) ([place])   . Depression   . Diabetes mellitus without complication ([place])   . GERD (gastroesophageal reflux disease)   . Hyperlipidemia   . Morbid obesity ([place])   . NICM (nonischemic cardiomyopathy) ([place])    a. 03/2019 Echo: Nl LVEF; b. 09/04/2019 Echo: EF 25-30%, Gr1 DD, glob HK, nl RV fxn; c. 09/11/2019 Cath: nonobs LAD/RCA dzs. EF 50-55%. RA 10, RV 29/8(11), PA 27/16(20), PCWP 12. CO/CI 5.04/2.51.  Marland Kitchen Nonobstructive CAD (coronary artery disease)    a. 03/2019 [facility]): EF 63%, ant ischemia; b. 08/2019 Cath: LM nl, LAD 69m RCA 70p (iFR nl @ 0.97), 50p. EF 50-55%-->Med Rx.  . Osteoporosis   . Panic attack   . PTSD (post-traumatic stress disorder)   . Rectal fistula   . Sleep apnea    a. Prev wore CPAP but lost insurance and hasn't been using since (now has insurance).  . Wears dentures    full upper    Past Surgical History:  Procedure Laterality Date  . ABDOMINAL HYSTERECTOMY    . COLONOSCOPY  2014  . COLONOSCOPY WITH PROPOFOL N/A 10/01/2016   Procedure: COLONOSCOPY WITH PROPOFOL;  Surgeon: DLucilla Lame MD;  [place] [facility]  Service: Endoscopy;  Laterality: N/A;  . CORONARY PRESSURE WIRE/FFR WITH 3D MAPPING  N/A 09/11/2019   Procedure: Coronary Pressure Wire/FFR w/3D Mapping;  Surgeon: ENelva Bush MD;  [facility];  Service: Cardiovascular;  Laterality: N/A;  . CYSTOCELE REPAIR N/A 12/17/2016   Procedure: ANTERIOR REPAIR (CYSTOCELE);  Surgeon: MBrayton Mars MD;  Location: [facility];  Service: Gynecology;  Laterality: N/A;  . ESOPHAGOGASTRODUODENOSCOPY (EGD) WITH PROPOFOL N/A 08/11/2019   Procedure: ESOPHAGOGASTRODUODENOSCOPY (EGD) WITH PROPOFOL;  Surgeon: WLucilla Lame MD;  Location: [facility];  Service: Endoscopy;  Laterality: N/A;  . POLYPECTOMY  10/01/2016   Procedure: POLYPECTOMY;  Surgeon: DLucilla Lame MD;  [place] [facility]  Service: Endoscopy;;  . RECTAL SURGERY  06/28/2015   Rectal prolapse, laparoscopic rectopexy the coldCharmont, MD; [facility]  . RIGHT/LEFT HEART CATH AND CORONARY ANGIOGRAPHY Bilateral 09/11/2019   Procedure: RIGHT/LEFT HEART CATH AND CORONARY ANGIOGRAPHY;  Surgeon: GMinna Merritts MD;  [facility];  Service: Cardiovascular;  Laterality: Bilateral;  . TUBAL LIGATION    . VAGINAL HYSTERECTOMY Bilateral 12/17/2016   Procedure: HYSTERECTOMY VAGINAL WITH BILATERAL SALPINGO OOPHERECTOMY;  Surgeon: MBrayton Mars MD;  Location: [facility];  Service: Gynecology;  Laterality: Bilateral;    Current Medications: Current Meds  Medication Sig  . aspirin 81 MG tablet Take 81 mg by mouth daily.   Marland Kitchen atorvastatin (LIPITOR) 10 MG tablet Take 1 tablet (10 mg total) by mouth at bedtime.  . blood glucose meter kit and supplies Dispense based on patient and insurance preference. Check fasting blood sugar once daily. (FOR ICD E11.69).  . dapagliflozin propanediol (FARXIGA) 5 MG TABS tablet Take 1 tablet (5 mg total) by mouth daily before breakfast.  . furosemide (LASIX) 20 MG tablet Take 1 tablet (20 mg total) by mouth daily as needed for fluid or edema (for weight gain of >3lbs overnight or >5lbs in one week or increased  shortness of breath).  . gabapentin (NEURONTIN) 300 MG capsule Take 1 capsule (300 mg total) by mouth 3 (three) times daily.  . isosorbide mononitrate (IMDUR) 30 MG 24 hr tablet TAKE 1 TABLET BY MOUTH  DAILY  . Lancets (ONETOUCH DELICA PLUS [id number]) MISC daily.   Marland Kitchen losartan (COZAAR) 25 MG tablet Take 0.5 tablets (12.5 mg total) by mouth daily.  . metFORMIN (GLUCOPHAGE) 500 MG tablet Take 1 tablet (500 mg total) by mouth 2 (two) times daily with a meal.  . metoprolol succinate (TOPROL XL) 25 MG 24 hr tablet Take 1.5 tablets (37.5 mg total) by mouth daily.  Glory Rosebush ULTRA test strip 1 each daily.  . pantoprazole (PROTONIX) 40 MG tablet Take 40 mg by mouth daily.   . polyethylene glycol powder (GLYCOLAX/MIRALAX) 17 GM/SCOOP powder Take 1-2 doses daily as needed for constipation/to prevent constipation  . QUEtiapine (SEROQUEL) 100 MG tablet Take 1 tablet (100 mg total) by mouth at bedtime.  Marland Kitchen spironolactone (ALDACTONE) 25 MG tablet Take 0.5 tablets (12.5 mg total) by mouth daily.    Allergies:   Lisinopril   Social History   Socioeconomic History  . Marital status: Married    Spouse name: Paediatric nurse  . Number of children: 1  . Years of education: Not on file  . Highest education level: 9th grade  Occupational History    Employer: DISABLED    Comment: for panic attacks  Tobacco Use  . Smoking status: Former Smoker    Packs/day: 0.50    Years: 47.00    Pack years: 23.50    Types: Cigarettes    Quit date: 11/29/2018    Years since quitting: 1.7  . Smokeless tobacco: Never Used  . Tobacco comment: started at age 76  Vaping Use  . Vaping Use: Never used  Substance and Sexual Activity  . Alcohol use: No    Alcohol/week: 0.0 standard drinks  . Drug use: No  . Sexual activity: Not Currently  Other Topics Concern  . Not on file  Social History Narrative   Pt had one son, who died when was 47 YO in a MVA.   Social Determinants of Health   Financial Resource Strain: Low Risk   .  Difficulty of Paying Living Expenses: Not hard at all  Food Insecurity: No Food Insecurity  . Worried About Charity fundraiser in the Last Year: Never true  . Ran Out of Food in the Last Year: Never true  Transportation Needs: No Transportation Needs  . Lack of Transportation (Medical): No  . Lack of Transportation (Non-Medical): No  Physical Activity: Inactive  . Days of Exercise per Week: 0 days  . Minutes of Exercise per Session: 0 min  Stress: Stress Concern Present  . Feeling of Stress : Very  much  Social Connections: Socially Isolated  . Frequency of Communication with Friends and Family: Twice a week  . Frequency of Social Gatherings with Friends and Family: Never  . Attends Religious Services: Never  . Active Member of Clubs or Organizations: No  . Attends Archivist Meetings: Never  . Marital Status: Married     Family History:  The patient's family history includes Cancer in her sister; Diabetes in her brother, brother, mother, sister, and sister; Healthy in her sister; Heart disease in her brother and father; Hypertension in her father; Lymphoma in her brother and brother. There is no history of Breast cancer, Ovarian cancer, or Colon cancer.  ROS:   Review of Systems  Constitutional: Positive for malaise/fatigue. Negative for chills, diaphoresis, fever and weight loss.  HENT: Negative for congestion.   Eyes: Negative for discharge and redness.  Respiratory: Positive for shortness of breath. Negative for cough, sputum production and wheezing.   Cardiovascular: Positive for orthopnea. Negative for chest pain, palpitations, claudication, leg swelling and PND.       Stable two-pillow orthopnea  Gastrointestinal: Negative for abdominal pain, heartburn, nausea and vomiting.  Musculoskeletal: Negative for falls and myalgias.  Skin: Negative for rash.  Neurological: Positive for weakness. Negative for dizziness, tingling, tremors, sensory change, speech change, focal  weakness and loss of consciousness.  Endo/Heme/Allergies: Does not bruise/bleed easily.  Psychiatric/Behavioral: Negative for substance abuse. The patient is not nervous/anxious.   All other systems reviewed and are negative.    EKGs/Labs/Other Studies Reviewed:    Studies reviewed were summarized above. The additional studies were reviewed today:  Coronary CTA 06/30/2020: Aorta: Normal size. Mild aortic atherosclerosis. No dissection.  Aortic Valve: Trileaflet. No calcifications.  Coronary Arteries: Normal coronary origin. Right dominance.  RCA is a large dominant artery that gives rise to PDA and PLVB. There is mild (25-49%) soft plaque stenosis in the proximal to mid vessel followed by mild (25-49%) mixed plaque stenosis.  Left main is a large artery that gives rise to LAD and LCX arteries.  LAD is a large vessel that gives rise to medium size D1 and D2 and small D3; there is long, soft plaque stenosis in the mid vessel mild (25-49%) followed by moderate (50-69%).  LCX is a non-dominant artery that gives rise to one large OM1 branch. There is no plaque.  Other findings:  Normal pulmonary vein drainage into the left atrium.  Normal let atrial appendage without a thrombus.  Normal size of the pulmonary artery.  IMPRESSION: 1. Coronary calcium score of 39.3. This was 69 percentile for age and sex matched control.  2. Normal coronary origin with right dominance.  3. Moderate (50-69%) soft plaque stenosis in the mid LAD; mild (25-49%) soft and mixed plaque stenoses in the proximal to mid RCA.  4. Study will be sent for FFR. __________  FFR 07/01/2020: 1. Left Main: findings 0.99, 0.99  2. LAD: findings 0.99, 0.97 0.92  3. OM: Findings 0.98, 0.88 0.84  4. RCA: findings 0.97, 0.81 0.84  IMPRESSION: FFR not suggestive of significant obstructive disease. __________  Coronary CTA 06/09/2020: Unable to be performed secondary to heart  rate -Calcium score 2.67 which places the patient in the 49th percentile for age and sex matched control __________   2D echo 05/30/2020: 1. Left ventricular ejection fraction, by estimation, is 30 to 35%. The  left ventricle has moderately decreased function. Left ventricular  endocardial border not optimally defined to evaluate regional wall motion.  Left ventricular  diastolic parameters  are consistent with Grade II diastolic dysfunction (pseudonormalization).  2. Right ventricular systolic function was not well visualized. The right  ventricular size is normal. Tricuspid regurgitation signal is inadequate  for assessing PA pressure.  3. The mitral valve is normal in structure. Mild mitral valve  regurgitation. No evidence of mitral stenosis.  4. The aortic valve was not well visualized. Aortic valve regurgitation  is not visualized. No aortic stenosis is present.  5. The inferior vena cava is normal in size with greater than 50%  respiratory variability, suggesting right atrial pressure of 3 mmHg.  6. Challenging image quality. __________  2D echo 08/2019: 1. Left ventricular ejection fraction, by visual estimation, is 25 to  30%. The left ventricle has moderate to severely decreased function. There  is no left ventricular hypertrophy.  2. Left ventricular diastolic parameters are consistent with Grade I  diastolic dysfunction (impaired relaxation).  3. The left ventricle demonstrates global hypokinesis.  4. Global right ventricle has normal systolic function.The right  ventricular size is normal. No increase in right ventricular wall  thickness.  5. Left atrial size was normal.  6. TR signal is inadequate for assessing pulmonary artery systolic  Pressure. __________  [facility] 08/2019: Coronary angiography:  Coronary dominance: Right  Left mainstem: Large vessel that bifurcates into the LAD and left circumflex, no significant disease noted  Left anterior  descending (LAD): Large vessel that extends to the apical region, diagonal branch 2 of moderate size, mild proximal LAD disease estimated at 30%  Left circumflex (LCx): Large vessel with OM branch 2, no significant disease noted  Right coronary artery (RCA): Right dominant vessel with PL and PDA, 70% proximal RCA disease followed by sequential 50% lesion proximal to mid vessel  Left ventriculography: Left ventricular systolic function is normal, LVEF is estimated at 50 to 55%, there is no significant mitral regurgitation , no significant aortic valve stenosis  Right heart pressures RA 10 RV 29/8, 11 PA 27/16 mean 20 Wedge 12 Cardiac output 5.04 Cardiac index 2.51  Final Conclusions:  Single-vessel disease RCA Discussed with interventional cardiology, they will perform FFR to determine if intervention needed Right heart pressures are normal   Recommendations:  We will defer coronary intervention to interventional cardiology Needs weight loss, management of COPD Management of hyperlipidemia __________  iFR 08/2019:  Conclusions: 1. Single vessel coronary with sequential 70% and 50% proximal and mid RCA stenoses that are not hemodynamically significant (iFR = 0.97). See Dr. Donivan Scull diagnostic catheterization report for further details of right/left heart catheterization.  Recommendations: 1. Medical therapy and risk factor modification to prevent progression of disease.   EKG:  EKG is ordered today.  The EKG ordered today demonstrates NSR, 88 bpm, rare PVC, LBBB (known)  Recent Labs: 11/30/2019: ALT 48; Hemoglobin 13.2; Platelets 306 06/10/2020: Magnesium 1.9 08/30/2020: BUN 18; Creatinine, Ser 0.75; Potassium 4.2; Sodium 141  Recent Lipid Panel    Component Value Date/Time   CHOL 68 (L) 06/15/2020 1552   TRIG 127 06/15/2020 1552   HDL 35 (L) 06/15/2020 1552   CHOLHDL 1.5 11/30/2019 1135   CHOLHDL 1.7 01/01/2018 0000   VLDL 29 08/06/2016 0824   LDLCALC 10  06/15/2020 1552   LDLCALC 29 01/01/2018 0000    PHYSICAL EXAM:    VS:  BP 112/70 (BP Location: Right Arm, Patient Position: Sitting, Cuff Size: Normal)   Pulse 88   Ht _0  (1.6 m)   Wt 235 lb (106.6 kg)   SpO2 96%  BMI 41.63 kg/m   BMI: Body mass index is 41.63 kg/m.  Physical Exam Vitals reviewed.  Constitutional:      Appearance: She is well-developed.  HENT:     Head: Normocephalic and atraumatic.  Eyes:     General:        Right eye: No discharge.        Left eye: No discharge.  Neck:     Vascular: No JVD.  Cardiovascular:     Rate and Rhythm: Normal rate and regular rhythm.     Pulses: No midsystolic click and no opening snap.          Posterior tibial pulses are 2+ on the right side and 2+ on the left side.     Heart sounds: Normal heart sounds, S1 normal and S2 normal. Heart sounds not distant. No murmur heard.  No friction rub.  Pulmonary:     Effort: Pulmonary effort is normal. No respiratory distress.     Breath sounds: Examination of the right-lower field reveals rales. Examination of the left-lower field reveals rales. Rales present. No decreased breath sounds or wheezing.  Chest:     Chest wall: No tenderness.  Abdominal:     General: There is distension.     Palpations: Abdomen is soft.     Tenderness: There is no abdominal tenderness.  Musculoskeletal:     Cervical back: Normal range of motion.  Skin:    General: Skin is warm and dry.     Nails: There is no clubbing.  Neurological:     Mental Status: She is alert and oriented to person, place, and time.  Psychiatric:        Speech: Speech normal.        Behavior: Behavior normal.        Thought Content: Thought content normal.        Judgment: Judgment normal.     Wt Readings from Last 3 Encounters:  09/07/20 235 lb (106.6 kg)  08/22/20 235 lb (106.6 kg)  07/20/20 235 lb (106.6 kg)     ASSESSMENT & PLAN:   1. HFrEF secondary to NICM with underlying LBBB: She has NYHA class III  symptoms.  Volume status is somewhat difficult to assess on physical exam secondary to body habitus though there are some mild bibasilar crackles on exam with a mildly distended abdomen.  Resume Lasix 20 mg daily with a follow-up BMP approximately 1 week later.  Continue GDMT including Toprol-XL, losartan, Farxiga, and spironolactone.  Schedule limited echo to reevaluate LVSF on maximally tolerated evidence-based medical therapy.    Given her persistent dyspnea and underlying cardiomyopathy with LBBB we will refer her to EP for consideration of CRT-D.  Consider referral to the advanced heart failure clinic based on echo results and EP evaluation. CHF education.  2. Nonobstructive CAD: No symptoms of chest pain.  Prior noted moderate RCA disease with normal IFR in 08/2019.  Subsequent coronary CTA in 06/2020 showed nonobstructive CAD.  Continue aggressive factor modification and medical therapy including aspirin, atorvastatin, metoprolol succinate, and isosorbide mononitrate.  No indication for further ischemic testing at this time.  3. PVCs: Improved on metoprolol which will be continued.  4. HLD: LDL 10.  She remains on atorvastatin with history of fatty liver disease.  5. COPD/restrictive lung disease: Stable and followed by pulmonology.   6. Sleep apnea: She has a history of sleep apnea and was on CPAP though lost her insurance and therefore no longer has her CPAP  machine. Her morbid obesity, OSA, and likely OHS are likely contributing to her overall presentation. Recommend she follow-up with pulmonology for further recommendations regarding this.  7. Morbid obesity/deconditioning: Contributing to her overall presentation.  Intolerant to cardiopulmonary rehab.  Weight loss is recommended.   Disposition: F/u with Dr. Rockey Situ or an APP in 1 month.   Medication Adjustments/Labs and Tests Ordered: Current medicines are reviewed at length with the patient today.  Concerns regarding medicines are  outlined above. Medication changes, Labs and Tests ordered today are summarized above and listed in the Patient Instructions accessible in Encounters.   Signed, Christell Faith, PA-C 09/07/2020 8:59 AM     [address], [place] [id number] [phone]

## 2020-09-07 ENCOUNTER — Ambulatory Visit (INDEPENDENT_AMBULATORY_CARE_PROVIDER_SITE_OTHER): Payer: Medicare Other | Admitting: Physician Assistant

## 2020-09-07 ENCOUNTER — Other Ambulatory Visit: Payer: Self-pay

## 2020-09-07 ENCOUNTER — Encounter: Payer: Self-pay | Admitting: Physician Assistant

## 2020-09-07 VITALS — BP 112/70 | HR 88 | Ht 63.0 in | Wt 235.0 lb

## 2020-09-07 DIAGNOSIS — G4733 Obstructive sleep apnea (adult) (pediatric): Secondary | ICD-10-CM

## 2020-09-07 DIAGNOSIS — I493 Ventricular premature depolarization: Secondary | ICD-10-CM | POA: Diagnosis not present

## 2020-09-07 DIAGNOSIS — E785 Hyperlipidemia, unspecified: Secondary | ICD-10-CM

## 2020-09-07 DIAGNOSIS — I2510 Atherosclerotic heart disease of native coronary artery without angina pectoris: Secondary | ICD-10-CM | POA: Diagnosis not present

## 2020-09-07 DIAGNOSIS — I428 Other cardiomyopathies: Secondary | ICD-10-CM | POA: Diagnosis not present

## 2020-09-07 DIAGNOSIS — I5022 Chronic systolic (congestive) heart failure: Secondary | ICD-10-CM | POA: Diagnosis not present

## 2020-09-07 DIAGNOSIS — J984 Other disorders of lung: Secondary | ICD-10-CM

## 2020-09-07 DIAGNOSIS — I447 Left bundle-branch block, unspecified: Secondary | ICD-10-CM

## 2020-09-07 DIAGNOSIS — J410 Simple chronic bronchitis: Secondary | ICD-10-CM

## 2020-09-07 MED ORDER — FUROSEMIDE 20 MG PO TABS: 20.0000 mg | ORAL_TABLET | Freq: Every day | ORAL | Status: DC

## 2020-09-07 NOTE — Patient Instructions (Signed)
Medication Instructions:  Your physician has recommended you make the following change in your medication:   RESUME Lasix 20 mg daily  *If you need a refill on your cardiac medications before your next appointment, please call your pharmacy*   Lab Work: Your physician recommends that you return for lab work (bmet) in: 1 week  Please have your lab drawn at the [facility]. You do not need an appt. Lab hours are Mon-Fri 7am-6pm.  If you have labs (blood work) drawn today and your tests are completely normal, you will receive your results only by: Marland Kitchen MyChart Message (if you have MyChart) OR . A paper copy in the mail If you have any lab test that is abnormal or we need to change your treatment, we will call you to review the results.   Testing/Procedures: Your physician has requested that you have an echocardiogram. Echocardiography is a painless test that uses sound waves to create images of your heart. It provides your doctor with information about the size and shape of your heart and how well your heart's chambers and valves are working. This procedure takes approximately one hour. There are no restrictions for this procedure. (To be done prior to EP appt)    Follow-Up: At [facility], you and your health needs are our priority.  As part of our continuing mission to provide you with exceptional heart care, we have created designated Provider Care Teams.  These Care Teams include your primary Cardiologist (physician) and Advanced Practice Providers (APPs -  Physician Assistants and Nurse Practitioners) who all work together to provide you with the care you need, when you need it.  We recommend signing up for the patient portal called "MyChart".  Sign up information is provided on this After Visit Summary.  MyChart is used to connect with patients for Virtual Visits (Telemedicine).  Patients are able to view lab/test results, encounter notes, upcoming appointments, etc.  Non-urgent  messages can be sent to your provider as well.   To learn more about what you can do with MyChart, go to [email or web address].    Your next appointment:   4 week(s)  The format for your next appointment:   In Person  Provider:   You may see Ida Rogue, MD or one of the following Advanced Practice Providers on your designated Care Team:    Murray Hodgkins, NP  Christell Faith, PA-C  Marrianne Mood, PA-C  Cadence Matheny, [place]  Laurann Montana, NP    Other Instructions You have been referred to EP for CRTD consideration

## 2020-09-09 ENCOUNTER — Ambulatory Visit (INDEPENDENT_AMBULATORY_CARE_PROVIDER_SITE_OTHER): Payer: Medicare Other

## 2020-09-09 ENCOUNTER — Other Ambulatory Visit: Payer: Self-pay

## 2020-09-09 DIAGNOSIS — I5022 Chronic systolic (congestive) heart failure: Secondary | ICD-10-CM | POA: Diagnosis not present

## 2020-09-09 LAB — ECHOCARDIOGRAM LIMITED
Calc EF: 32.5 %
Single Plane A2C EF: 35.7 %
Single Plane A4C EF: 29.1 %

## 2020-09-09 MED ORDER — PERFLUTREN LIPID MICROSPHERE
1.0000 mL | INTRAVENOUS | Status: AC | PRN
  Administered 2020-09-09: 2 mL via INTRAVENOUS

## 2020-09-15 ENCOUNTER — Other Ambulatory Visit: Payer: Self-pay | Admitting: Family Medicine

## 2020-09-16 NOTE — Telephone Encounter (Signed)
Requested medication (s) are due for refill today: no  Requested medication (s) are on the active medication list: yes   Last refill:  07/22/2020  Future visit scheduled: yes   Notes to clinic:  this refill cannot be delegated    Requested Prescriptions  Pending Prescriptions Disp Refills   QUEtiapine (SEROQUEL) 100 MG tablet [Pharmacy Med Name: QUETIAPINE  100MG   TAB] 90 tablet 3    Sig: TAKE 1 TABLET BY MOUTH AT  BEDTIME      Not Delegated - Psychiatry:  Antipsychotics - Second Generation (Atypical) - quetiapine Failed - 09/15/2020 10:09 PM      Failed - This refill cannot be delegated      Failed - ALT in normal range and within 180 days    ALT  Date Value Ref Range Status  11/30/2019 48 (H) 0 - 32 IU/L Final   SGPT (ALT)  Date Value Ref Range Status  02/24/2013 94 (H) 12 - 78 U/L Final          Failed - AST in normal range and within 180 days    AST  Date Value Ref Range Status  11/30/2019 27 0 - 40 IU/L Final   SGOT(AST)  Date Value Ref Range Status  02/24/2013 58 (H) 15 - 37 Unit/L Final          Passed - Completed PHQ-2 or PHQ-9 in the last 360 days      Passed - Last BP in normal range    BP Readings from Last 1 Encounters:  09/07/20 112/70          Passed - Valid encounter within last 6 months    Recent Outpatient Visits           3 months ago Type 2 diabetes mellitus with peripheral neuropathy ([place])   [facility], [place] T, NP   5 months ago Insomnia, unspecified type   [facility], [place], [place]   6 months ago Chronic left-sided low back pain without sciatica   [facility] [place] American, PA-C   6 months ago Type 2 diabetes mellitus with other specified complication, without long-term current use of insulin [facility])   [facility] [place], Dionne Bucy, MD   8 months ago [facility], Dionne Bucy, MD       Future Appointments              In 1 week Venita Lick, NP [employer], [place]   In 1 month [name], Ryan M, PA-C [place], [place]

## 2020-09-18 ENCOUNTER — Encounter: Payer: Self-pay | Admitting: Nurse Practitioner

## 2020-09-18 DIAGNOSIS — G4733 Obstructive sleep apnea (adult) (pediatric): Secondary | ICD-10-CM | POA: Insufficient documentation

## 2020-09-19 NOTE — Telephone Encounter (Signed)
Patient is requesting a refill

## 2020-09-23 ENCOUNTER — Ambulatory Visit: Payer: Medicare Other | Admitting: Nurse Practitioner

## 2020-09-29 NOTE — Telephone Encounter (Signed)
Pt has been r/s  

## 2020-10-05 ENCOUNTER — Encounter: Payer: Self-pay | Admitting: Cardiology

## 2020-10-05 ENCOUNTER — Other Ambulatory Visit: Payer: Self-pay

## 2020-10-05 ENCOUNTER — Ambulatory Visit (INDEPENDENT_AMBULATORY_CARE_PROVIDER_SITE_OTHER): Payer: Medicare Other | Admitting: Cardiology

## 2020-10-05 ENCOUNTER — Telehealth: Payer: Self-pay | Admitting: Cardiology

## 2020-10-05 VITALS — BP 120/80 | HR 95 | Ht 63.0 in | Wt 239.0 lb

## 2020-10-05 DIAGNOSIS — E1142 Type 2 diabetes mellitus with diabetic polyneuropathy: Secondary | ICD-10-CM

## 2020-10-05 DIAGNOSIS — J410 Simple chronic bronchitis: Secondary | ICD-10-CM | POA: Diagnosis not present

## 2020-10-05 DIAGNOSIS — I5042 Chronic combined systolic (congestive) and diastolic (congestive) heart failure: Secondary | ICD-10-CM

## 2020-10-05 MED ORDER — ENTRESTO 24-26 MG PO TABS: 1.0000 | ORAL_TABLET | Freq: Two times a day (BID) | ORAL | 6 refills | Status: DC

## 2020-10-05 NOTE — Patient Instructions (Addendum)
Medication Instructions:  - Your physician has recommended you make the following change in your medication:   1) STOP losartan  2) On Saturday 10/08/20- START Entresto 24/26 mg- take 1 tablet by mouth TWICE daily  Samples Given: Entresto 24/26 mg Lot: [id number] Exp: 02/2022 # 1 bottle given  *If you need a refill on your cardiac medications before your next appointment, please call your pharmacy*   Lab Work: - none ordered  If you have labs (blood work) drawn today and your tests are completely normal, you will receive your results only by: Marland Kitchen MyChart Message (if you have MyChart) OR . A paper copy in the mail If you have any lab test that is abnormal or we need to change your treatment, we will call you to review the results.   Testing/Procedures: - none ordered   Follow-Up: At [facility], you and your health needs are our priority.  As part of our continuing mission to provide you with exceptional heart care, we have created designated Provider Care Teams.  These Care Teams include your primary Cardiologist (physician) and Advanced Practice Providers (APPs -  Physician Assistants and Nurse Practitioners) who all work together to provide you with the care you need, when you need it.  We recommend signing up for the patient portal called "MyChart".  Sign up information is provided on this After Visit Summary.  MyChart is used to connect with patients for Virtual Visits (Telemedicine).  Patients are able to view lab/test results, encounter notes, upcoming appointments, etc.  Non-urgent messages can be sent to your provider as well.   To learn more about what you can do with MyChart, go to [email or web address].    Your next appointment:   2 week(s)  The format for your next appointment:   In Person  Provider:   Lars Mage, MD   Other Instructions  1) You are being referred to the [facility] in [place]- they will contact you to schedule, but it may  take a few weeks to get a call  - if it has been more than 3 weeks, and you have not been contacted, please call [phone] to inquire about this appointment.    2) ENTESTO (Sacubitril); Valsartan Oral Tablets  What is this medicine? SACUBITRIL; VALSARTAN (sak UE bi tril; val SAR tan) is a combination of a neprilysin inhibitor and a an angiotensin II receptor blocker. It treats heart failure. This medicine may be used for other purposes; ask your health care provider or pharmacist if you have questions. COMMON BRAND NAME(S): Entresto What should I tell my health care provider before I take this medicine? They need to know if you have any of these conditions:  diabetes and take a medicine that contains aliskiren  kidney disease  liver disease  an unusual or allergic reaction to sacubitril; valsartan, drugs called angiotensin converting enzyme (ACE) inhibitors, angiotensin II receptor blockers (ARBs), other medicines, foods, dyes, or preservatives  pregnant or trying to get pregnant  breast-feeding How should I use this medicine? Take this drug by mouth. Take it as directed on the prescription label at the same time every day. You can take it with or without food. If it upsets your stomach, take it with food. Keep taking it unless your health care provider tells you to stop. Talk to your health care provider about the use of this drug in children. While it may be prescribed for children as young as 1 for selected conditions, precautions do apply.  Overdosage: If you think you have taken too much of this medicine contact a poison control center or emergency room at once. NOTE: This medicine is only for you. Do not share this medicine with others. What if I miss a dose? If you miss a dose, take it as soon as you can. If it is almost time for your next dose, take only that dose. Do not take double or extra doses. What may interact with this medicine? Do not take this medicine with any  of the following medicines:  aliskiren if you have diabetes  angiotensin-converting enzyme (ACE) inhibitors, like benazepril, captopril, enalapril, fosinopril, lisinopril, or ramipril This medicine may also interact with the following medicines:  angiotensin II receptor blockers (ARBs) like azilsartan, candesartan, eprosartan, irbesartan, losartan, olmesartan, telmisartan, or valsartan  lithium  NSAIDS, medicines for pain and inflammation, like ibuprofen or naproxen  potassium-sparing diuretics like amiloride, spironolactone, and triamterene  potassium supplements This list may not describe all possible interactions. Give your health care provider a list of all the medicines, herbs, non-prescription drugs, or dietary supplements you use. Also tell them if you smoke, drink alcohol, or use illegal drugs. Some items may interact with your medicine. What should I watch for while using this medicine? Tell your doctor or healthcare professional if your symptoms do not start to get better or if they get worse. Do not become pregnant while taking this medicine. Women should inform their doctor if they wish to become pregnant or think they might be pregnant. There is a potential for serious side effects to an unborn child. Talk to your health care professional or pharmacist for more information. You may get dizzy. Do not drive, use machinery, or do anything that needs mental alertness until you know how this medicine affects you. Do not stand or sit up quickly, especially if you are an older patient. This reduces the risk of dizzy or fainting spells. Avoid alcoholic drinks; they can make you more dizzy. What side effects may I notice from receiving this medicine? Side effects that you should report to your doctor or health care professional as soon as possible:  allergic reactions like skin rash, itching or hives, swelling of the face, lips, or tongue  signs and symptoms of increased potassium like  muscle weakness; chest pain; or fast, irregular heartbeat  signs and symptoms of kidney injury like trouble passing urine or change in the amount of urine  signs and symptoms of low blood pressure like feeling dizzy or lightheaded, or if you develop extreme fatigue Side effects that usually do not require medical attention (report to your doctor or health care professional if they continue or are bothersome):  cough This list may not describe all possible side effects. Call your doctor for medical advice about side effects. You may report side effects to FDA at 1-800-FDA-1088. Where should I keep my medicine? Keep out of the reach of children and pets. Store at room temperature between 20 and 25 degrees C (68 and 77 degrees F). Protect from moisture. Keep the container tightly closed. Throw away any unused drug after the expiration date. NOTE: This sheet is a summary. It may not cover all possible information. If you have questions about this medicine, talk to your doctor, pharmacist, or health care provider.  2020 Elsevier/Gold Standard (2019-05-13 16:03:07)

## 2020-10-05 NOTE — Telephone Encounter (Signed)
Patient calling in to state that the [facility] prescribed today is too expensive for her

## 2020-10-05 NOTE — Telephone Encounter (Signed)
She can apply for patient assistance with [employer]. [email or web address]  click on the PAP enrollment application on the right hand side. If patient fills out their section and drops it off at the office, then Dr. Quentin Ore can sign and it can be faxed to [employer]

## 2020-10-05 NOTE — Telephone Encounter (Signed)
I spoke with the patient. She was seen by Dr. Quentin Ore in the [facility] office this morning.  She was advised to start Entresto 24/26 mg BID (on 10/08/20).  The patient was given samples today & a free 30-day trial card. I did send in a RX to her pharmacy. She spoke with them and found out she will have to pay $150 out of pocket for a 30 day supply. She advised this is unaffordable for her.   I advised her I will speak with our pharmacy team to see what next steps they would recommend this late in the year. The patient has been advised to start [facility] as prescribed this morning and take advantage of the free 30 day trial card as well. She is aware I will call her back with more updates in regards to cost when I receive them.  The patient voices understanding and is agreeable.

## 2020-10-05 NOTE — Progress Notes (Signed)
Electrophysiology Office Note:    Date:  10/05/2020   ID:  Kim Buchanan, DOB 1951-06-23, MRN [id number]  PCP:  Venita Lick, NP  [facility] Cardiologist:  Ida Rogue, MD  [facility] Electrophysiologist:  None   Referring MD: Rise Mu, PA-C   Chief Complaint: nonIschemic cardiomyopathy  History of Present Illness:    Kim Buchanan is a 69 y.o. female who presents for an evaluation of nonischemic cardiomyopathy and left bundle branch block at the request of Kim Faith, PA-C. Their medical history includes COPD, nonobstructive coronary artery disease, hyperlipidemia, diabetes, sleep apnea.  Patient tells me that she continues to have symptoms related to her cardiomyopathy.  She is symptomatic with minimal exertion.  She has to sleep propped upright on at least 2 pillows due to her feeling short of breath if she lays completely flat.  She does struggle with lower extremity edema although this is somewhat improved on a daily Lasix dose.  She tolerates her guideline-directed medical therapy without lightheadedness or dizziness.  She has never had a syncopal episode.  With lisinopril, she did have a cough that improved when the lisinopril was discontinued.  Past Medical History:  Diagnosis Date  . Anxiety   . Arthritis    lower back  . COPD (chronic obstructive pulmonary disease) ([place])   . Depression   . Diabetes mellitus without complication ([place])   . GERD (gastroesophageal reflux disease)   . Hyperlipidemia   . Morbid obesity ([place])   . NICM (nonischemic cardiomyopathy) ([place])    a. 03/2019 Echo: Nl LVEF; b. 09/04/2019 Echo: EF 25-30%, Gr1 DD, glob HK, nl RV fxn; c. 09/11/2019 Cath: nonobs LAD/RCA dzs. EF 50-55%. RA 10, RV 29/8(11), PA 27/16(20), PCWP 12. CO/CI 5.04/2.51.  Marland Kitchen Nonobstructive CAD (coronary artery disease)    a. 03/2019 [facility] [place]): EF 63%, ant ischemia; b. 08/2019 Cath: LM nl, LAD 16m RCA 70p (iFR nl @ 0.97), 50p. EF 50-55%-->Med Rx.  . Osteoporosis    . Panic attack   . PTSD (post-traumatic stress disorder)   . Rectal fistula   . Sleep apnea    a. Prev wore CPAP but lost insurance and hasn't been using since (now has insurance).  . Wears dentures    full upper    Past Surgical History:  Procedure Laterality Date  . ABDOMINAL HYSTERECTOMY    . CARDIAC CATHETERIZATION    . COLONOSCOPY  2014  . COLONOSCOPY WITH PROPOFOL N/A 10/01/2016   Procedure: COLONOSCOPY WITH PROPOFOL;  Surgeon: DLucilla Lame MD;  [facility] [facility]  Service: Endoscopy;  Laterality: N/A;  . CORONARY PRESSURE WIRE/FFR WITH 3D MAPPING N/A 09/11/2019   Procedure: Coronary Pressure Wire/FFR w/3D Mapping;  Surgeon: ENelva Bush MD;  [facility];  Service: Cardiovascular;  Laterality: N/A;  . CYSTOCELE REPAIR N/A 12/17/2016   Procedure: ANTERIOR REPAIR (CYSTOCELE);  Surgeon: MBrayton Mars MD;  Location: [facility];  Service: Gynecology;  Laterality: N/A;  . ESOPHAGOGASTRODUODENOSCOPY (EGD) WITH PROPOFOL N/A 08/11/2019   Procedure: ESOPHAGOGASTRODUODENOSCOPY (EGD) WITH PROPOFOL;  Surgeon: WLucilla Lame MD;  Location: [facility];  Service: Endoscopy;  Laterality: N/A;  . POLYPECTOMY  10/01/2016   Procedure: POLYPECTOMY;  Surgeon: DLucilla Lame MD;  [facility] [facility]  Service: Endoscopy;;  . RECTAL SURGERY  06/28/2015   Rectal prolapse, laparoscopic rectopexy the coldCharmont, MD; [facility]  . RIGHT/LEFT HEART CATH AND CORONARY ANGIOGRAPHY Bilateral 09/11/2019   Procedure: RIGHT/LEFT HEART CATH AND CORONARY ANGIOGRAPHY;  Surgeon: GIda Rogue  J, MD;  Location: [facility];  Service: Cardiovascular;  Laterality: Bilateral;  . TUBAL LIGATION    . VAGINAL HYSTERECTOMY Bilateral 12/17/2016   Procedure: HYSTERECTOMY VAGINAL WITH BILATERAL SALPINGO OOPHERECTOMY;  Surgeon: Brayton Mars, MD;  Location: [facility];  Service: Gynecology;  Laterality: Bilateral;    Current Medications: Current Meds  Medication Sig   . aspirin 81 MG tablet Take 81 mg by mouth daily.   Marland Kitchen atorvastatin (LIPITOR) 10 MG tablet Take 1 tablet (10 mg total) by mouth at bedtime.  . blood glucose meter kit and supplies Dispense based on patient and insurance preference. Check fasting blood sugar once daily. (FOR ICD E11.69).  . dapagliflozin propanediol (FARXIGA) 5 MG TABS tablet Take 1 tablet (5 mg total) by mouth daily before breakfast.  . furosemide (LASIX) 20 MG tablet Take 1 tablet (20 mg total) by mouth daily.  Marland Kitchen gabapentin (NEURONTIN) 300 MG capsule Take 1 capsule (300 mg total) by mouth 3 (three) times daily.  . isosorbide mononitrate (IMDUR) 30 MG 24 hr tablet TAKE 1 TABLET BY MOUTH  DAILY  . Lancets (ONETOUCH DELICA PLUS [id number]) MISC daily.   Marland Kitchen losartan (COZAAR) 25 MG tablet Take 0.5 tablets (12.5 mg total) by mouth daily.  . metFORMIN (GLUCOPHAGE) 500 MG tablet Take 1 tablet (500 mg total) by mouth 2 (two) times daily with a meal.  . metoprolol succinate (TOPROL XL) 25 MG 24 hr tablet Take 1.5 tablets (37.5 mg total) by mouth daily.  Glory Rosebush ULTRA test strip 1 each daily.  . pantoprazole (PROTONIX) 40 MG tablet Take 40 mg by mouth daily.   . polyethylene glycol powder (GLYCOLAX/MIRALAX) 17 GM/SCOOP powder Take 1-2 doses daily as needed for constipation/to prevent constipation  . QUEtiapine (SEROQUEL) 100 MG tablet TAKE 1 TABLET BY MOUTH AT  BEDTIME  . spironolactone (ALDACTONE) 25 MG tablet Take 0.5 tablets (12.5 mg total) by mouth daily.     Allergies:   Lisinopril   Social History   Socioeconomic History  . Marital status: Married    Spouse name: Paediatric nurse  . Number of children: 1  . Years of education: Not on file  . Highest education level: 9th grade  Occupational History    Employer: DISABLED    Comment: for panic attacks  Tobacco Use  . Smoking status: Former Smoker    Packs/day: 0.50    Years: 47.00    Pack years: 23.50    Types: Cigarettes    Quit date: 11/29/2018    Years since quitting: 1.8  .  Smokeless tobacco: Never Used  . Tobacco comment: started at age 34  Vaping Use  . Vaping Use: Never used  Substance and Sexual Activity  . Alcohol use: No    Alcohol/week: 0.0 standard drinks  . Drug use: No  . Sexual activity: Not Currently  Other Topics Concern  . Not on file  Social History Narrative   Pt had one son, who died when was 16 YO in a MVA.   Social Determinants of Health   Financial Resource Strain: Low Risk   . Difficulty of Paying Living Expenses: Not hard at all  Food Insecurity: No Food Insecurity  . Worried About Charity fundraiser in the Last Year: Never true  . Ran Out of Food in the Last Year: Never true  Transportation Needs: No Transportation Needs  . Lack of Transportation (Medical): No  . Lack of Transportation (Non-Medical): No  Physical Activity: Inactive  . Days of  Exercise per Week: 0 days  . Minutes of Exercise per Session: 0 min  Stress: Stress Concern Present  . Feeling of Stress : Very much  Social Connections: Socially Isolated  . Frequency of Communication with Friends and Family: Twice a week  . Frequency of Social Gatherings with Friends and Family: Never  . Attends Religious Services: Never  . Active Member of Clubs or Organizations: No  . Attends Archivist Meetings: Never  . Marital Status: Married     Family History: The patient's family history includes Cancer in her sister; Diabetes in her brother, brother, mother, sister, and sister; Healthy in her sister; Heart disease in her brother and father; Hypertension in her father; Lymphoma in her brother and brother. There is no history of Breast cancer, Ovarian cancer, or Colon cancer.  ROS:   Please see the history of present illness.    All other systems reviewed and are negative.  EKGs/Labs/Other Studies Reviewed:    The following studies were reviewed today: Prior notes, echo, cath  September 09, 2020 echo personally reviewed Left ventricular function moderately  decreased, 30% Normal right ventricular function No significant valvular abnormalities Severely dyskinetic septum consistent with left bundle branch block  09/11/2019 LHC/RHC personally reviewed Left mainstem:   Large vessel that bifurcates into the LAD and left circumflex, no significant disease noted Left anterior descending (LAD):   Large vessel that extends to the apical region, diagonal branch 2 of moderate size, mild proximal LAD disease estimated at 30% Left circumflex (LCx):  Large vessel with OM branch 2, no significant disease noted Right coronary artery (RCA):  Right dominant vessel with PL and PDA, 70% proximal RCA disease followed by sequential 50% lesion proximal to mid vessel Left ventriculography: Left ventricular systolic function is normal, LVEF is estimated at 50 to 55%, there is no significant mitral regurgitation , no significant aortic valve stenosis  Right heart pressures RA 10 RV 29/8, 11 PA 27/16 mean 20 Wedge 12 Cardiac output 5.04 Cardiac index 2.51  September 07, 2020 EKG personally reviewed shows a QRS duration of 130 ms in a left bundle pattern.  Rare PVCs.  EKG:  The ekg ordered today demonstrates sinus rhythm.  Left bundle branch block with a QRS duration 140 ms measured best in the lateral precordium.  Ventricular rate of 95 bpm.  Recent Labs: 11/30/2019: ALT 48; Hemoglobin 13.2; Platelets 306 06/10/2020: Magnesium 1.9 08/30/2020: BUN 18; Creatinine, Ser 0.75; Potassium 4.2; Sodium 141  Recent Lipid Panel    Component Value Date/Time   CHOL 68 (L) 06/15/2020 1552   TRIG 127 06/15/2020 1552   HDL 35 (L) 06/15/2020 1552   CHOLHDL 1.5 11/30/2019 1135   CHOLHDL 1.7 01/01/2018 0000   VLDL 29 08/06/2016 0824   LDLCALC 10 06/15/2020 1552   LDLCALC 29 01/01/2018 0000    Physical Exam:    VS:  BP 120/80 (BP Location: Right Arm, Patient Position: Sitting, Cuff Size: Large)   Pulse 95   Ht _0  (1.6 m)   Wt 239 lb (108.4 kg)   SpO2 94%   BMI 42.34  kg/m     Wt Readings from Last 3 Encounters:  10/05/20 239 lb (108.4 kg)  09/07/20 235 lb (106.6 kg)  08/22/20 235 lb (106.6 kg)     GEN:  Well nourished, well developed in no acute distress.  Obese. HEENT: Normal NECK: No JVD; No carotid bruits LYMPHATICS: No lymphadenopathy CARDIAC: RRR, no murmurs, rubs, gallops RESPIRATORY:  Clear to auscultation  without rales, wheezing or rhonchi.  Mild increased work of breathing during conversation. ABDOMEN: Soft, non-tender, non-distended MUSCULOSKELETAL: 1+ pitting edema to bilateral knees; No deformity  SKIN: Warm and dry NEUROLOGIC:  Alert and oriented x 3 PSYCHIATRIC:  Normal affect   ASSESSMENT:    1. Chronic combined systolic and diastolic heart failure ([place])   2. Simple chronic bronchitis ([place])   3. Type 2 diabetes mellitus with peripheral neuropathy ([facility])   4. Morbid obesity ([place])    PLAN:    In order of problems listed above:  1. Chronic combined systolic and diastolic heart failure Seems to be predominantly a nonischemic etiology although there is some coronary artery disease that is not flow-limiting by IFR.  Her ejection fraction is 30%.  She has a LBBB on ECG.  Her echo shows significant septal dyskinesis consistent with bundle branch block.  She has NYHA class III symptoms.  She is warm and slightly volume overloaded on exam although her volume status is improving according to the patient on her daily Lasix.  She is on good medical therapy although she has not ever been tried on [employer].  She tolerates losartan.  I would like to trial her on Entresto to see if we get an improvement in her symptom burden and her ejection fraction.  I will plan on stopping her losartan today and start Entresto (sacubitril 24 mg/valsartan 26 mg) on Saturday to allow an adequate washout of the losartan.  I will plan on seeing her back in clinic in 2 weeks to see how she is doing on the [place].  I will also put in a referral to Dr Aundra Dubin in  [place].  If she tolerates Entresto therapy, we will plan on repeating her echocardiogram in approximately 3 months to reassess her left ventricular function.  If it is persistently low despite this therapy, we will plan to proceed with a CRT-D implant.  I discussed CRT-D with the patient and her husband today at length including why it is recommended, risk for sudden cardiac death, procedural details including the risks and expected recovery time.  Patient and her husband are understanding of the plan.  2.  COPD Patient has been tobacco free for 2 years.  3.  Diabetes Continue Metformin and Farxiga  4.  Morbid obesity Counseled  Medication Adjustments/Labs and Tests Ordered: Current medicines are reviewed at length with the patient today.  Concerns regarding medicines are outlined above.  No orders of the defined types were placed in this encounter.  No orders of the defined types were placed in this encounter.    Signed, Lars Mage, MD, [facility]  10/05/2020 10:22 AM    Electrophysiology [facility]

## 2020-10-07 NOTE — Telephone Encounter (Signed)
I spoke with the patient and advised her we will go ahead and start the process for patient assistance for [facility] for her.  She is aware I am mailing her an application. She is advised to complete all parts of the patient section of the application and then either mail this back to our office/ bring it back to our front desk.  The patient voices understanding and is agreeable.

## 2020-10-17 ENCOUNTER — Other Ambulatory Visit: Payer: Self-pay

## 2020-10-17 ENCOUNTER — Encounter: Payer: Self-pay | Admitting: Nurse Practitioner

## 2020-10-17 ENCOUNTER — Ambulatory Visit (INDEPENDENT_AMBULATORY_CARE_PROVIDER_SITE_OTHER): Payer: Medicare Other | Admitting: Nurse Practitioner

## 2020-10-17 ENCOUNTER — Telehealth: Payer: Self-pay | Admitting: *Deleted

## 2020-10-17 VITALS — BP 134/80 | HR 99 | Temp 94.5°F | Wt 236.5 lb

## 2020-10-17 DIAGNOSIS — I152 Hypertension secondary to endocrine disorders: Secondary | ICD-10-CM

## 2020-10-17 DIAGNOSIS — E1142 Type 2 diabetes mellitus with diabetic polyneuropathy: Secondary | ICD-10-CM | POA: Diagnosis not present

## 2020-10-17 DIAGNOSIS — Z122 Encounter for screening for malignant neoplasm of respiratory organs: Secondary | ICD-10-CM

## 2020-10-17 DIAGNOSIS — E785 Hyperlipidemia, unspecified: Secondary | ICD-10-CM

## 2020-10-17 DIAGNOSIS — E1159 Type 2 diabetes mellitus with other circulatory complications: Secondary | ICD-10-CM | POA: Diagnosis not present

## 2020-10-17 DIAGNOSIS — G4733 Obstructive sleep apnea (adult) (pediatric): Secondary | ICD-10-CM

## 2020-10-17 DIAGNOSIS — J410 Simple chronic bronchitis: Secondary | ICD-10-CM

## 2020-10-17 DIAGNOSIS — Z87891 Personal history of nicotine dependence: Secondary | ICD-10-CM

## 2020-10-17 DIAGNOSIS — I700 Atherosclerosis of aorta: Secondary | ICD-10-CM

## 2020-10-17 DIAGNOSIS — I5020 Unspecified systolic (congestive) heart failure: Secondary | ICD-10-CM

## 2020-10-17 DIAGNOSIS — E1169 Type 2 diabetes mellitus with other specified complication: Secondary | ICD-10-CM | POA: Diagnosis not present

## 2020-10-17 DIAGNOSIS — G4701 Insomnia due to medical condition: Secondary | ICD-10-CM

## 2020-10-17 DIAGNOSIS — I428 Other cardiomyopathies: Secondary | ICD-10-CM

## 2020-10-17 LAB — BAYER DCA HB A1C WAIVED: HB A1C (BAYER DCA - WAIVED): 7.7 % — ABNORMAL HIGH (ref <7.0)

## 2020-10-17 MED ORDER — METFORMIN HCL 1000 MG PO TABS: 1000.0000 mg | ORAL_TABLET | Freq: Two times a day (BID) | ORAL | 4 refills | Status: DC

## 2020-10-17 MED ORDER — DAPAGLIFLOZIN PROPANEDIOL 10 MG PO TABS: 10.0000 mg | ORAL_TABLET | Freq: Every day | ORAL | 4 refills | Status: DC

## 2020-10-17 NOTE — Assessment & Plan Note (Signed)
Chronic, ongoing with no current inhalers.  Reached out to [employer] with lung screening team and he will reach out to patient today to get her scheduled for CT imaging, patient aware.  Continue collaboration with pulmonary and monitoring.

## 2020-10-17 NOTE — Assessment & Plan Note (Signed)
Chronic, ongoing.  Likely related to OSA issues, continue collaboration with pulmonary -- may benefit from CPAP in future.  Discussed with patient Seroquel and risks, do not feel comfortable increasing dose due to her current cardiac risk factors and age.  Recommend she take Melatonin, may take up to 10 MG, at night along with her Seroquel -- and try to reduce dose of Seroquel in future if possible.  She reports no benefit from Trazodone in past.  Could consider change to Belsomra in future.  Return in 3 months.

## 2020-10-17 NOTE — Assessment & Plan Note (Signed)
BMI 41.89 with T2DM and HTN + HF.  Recommended eating smaller high protein, low fat meals more frequently and exercising 30 mins a day 5 times a week with a goal of 10-15lb weight loss in the next 3 months. Patient voiced their understanding and motivation to adhere to these recommendations.

## 2020-10-17 NOTE — Assessment & Plan Note (Signed)
No current CPAP due to cost, continue to work with CCM team. 

## 2020-10-17 NOTE — Assessment & Plan Note (Signed)
Chronic, ongoing with BP at goal today.  Continue to collaborate with cardiology, recent notes and echo reviewed.  Continue current medication regimen and adjust as needed.  History of cough with ACE.  Recommend she monitor BP at home at least 3 days a week and document for providers.  Focus on DASH diet.  BMP today.  Return to office in 3 months. 

## 2020-10-17 NOTE — Assessment & Plan Note (Signed)
Chronic, ongoing.  Continue current medication regimen and adjust as needed.  Lipid panel next visit.    

## 2020-10-17 NOTE — Assessment & Plan Note (Signed)
Continue collaboration with cardiology and current medication regimen as prescribed by them.  Recent note reviewed. 

## 2020-10-17 NOTE — Telephone Encounter (Signed)
Received referral for initial lung cancer screening scan. Contacted patient and obtained smoking history,(former, quit 11/29/2018, 75 pack year) as well as answering questions related to screening process. Patient denies signs of lung cancer such as weight loss or hemoptysis. Patient denies comorbidity that would prevent curative treatment if lung cancer were found. Patient is scheduled for shared decision making visit and CT scan on 11/09/20 at 10am.

## 2020-10-17 NOTE — Assessment & Plan Note (Signed)
Chronic, ongoing with A1C 7.7% today, endorses poor diet adherence, and urine ALB 30 last visit.  Can not use ACE due to past history of cough.  Will increase Metformin to 1000 MG BID and Farxiga to 10 MG daily, this also benefits her HF.  Educated patient on medications and that if ongoing elevations will consider addition of GLP1 next visit, which may benefit weight loss.  Recommend she monitor BS daily and document.  Focus on diabetes diet and regular activity.  Return to office in 3 months.

## 2020-10-17 NOTE — Progress Notes (Deleted)
Cardiology Office Note    Date:  10/17/2020   ID:  Kim Buchanan, DOB Oct 25, 1950, MRN [id number]  PCP:  Venita Lick, NP  Cardiologist:  Ida Rogue, MD  Electrophysiologist:  [name], MD   Chief Complaint: Follow up  History of Present Illness:   Kim Buchanan is a 69 y.o. female with history of nonobstructive CAD, HFrEF secondary toNICM,COPD secondary to tobacco use quitting in 11/2018,restrictive lung disease,DM2, HLD, chronic dyspnea, obesity, untreated sleep apnea, GERD, anxiety, and depression who presents for follow-up of her cardiomyopathy.  She was previously followed by Dr. Jenita Seashore underwent echo in 03/2019 which showed normal LV systolic function. Stress testing was undertaken in the setting of ongoing dyspnea which showed anterior ischemia. Following this, there was recommendation for coronary CTA though the patient did not follow-up and was subsequently evaluated by pulmonology. In this setting,she was referred to Dr. Rockey Situ who saw the patient in 07/2019 for follow-up of the above prior cardiac testing. Records were not available at that time, therefore, sheunderwent repeat echo in 08/2019,which revealed an EF of 25 to 30%, global hypokinesis, and grade 1 diastolic dysfunction. There was no significant valvular disease. Given her new cardiomyopathy, she underwent diagnostic cardiac cath in 08/2019 which revealed a 70% stenosis in the proximal RCA. iFR was normal at 0.97. Medical therapy was recommended. Of note, LV gram was performed and showed an EF of 50 to 55%. Right heart cath showed normal filling pressures. She was seen in the office in 09/2019 and was doing well from a cardiac perspective. She was noted to have resolution of cough following discontinuation of losartan. She was started on low-dose carvedilol and referred to cardiopulmonary rehab.   She was seen in the office on 05/19/2020,and noted stable symptoms over the past  couple of years. She followed up with pulmonology with lung disease felt to be less likely the etiology of her symptoms, with mild restrictive physiology noted on PFTs. She was unable to participate in cardiopulmonary rehab secondary to dyspnea. It was noted she did not tolerate Coreg secondary to cough and self discontinued it. She underwent echo, per pulmonology on 05/30/2020, which showed an EF of 30-35%, Gr2DD, normal RV cavity size, and mild MR.She was scheduled for coronary CTA on 06/09/2020, whichwas unable to be performed secondary to heart rates.She was able to undergo calcium scoring with a calcium score of 2.67 which placed the patient in the 49th percentile for age and sex matched control.She was seen in follow up on 8/20/2021and continued to feel about the same with ongoing exertional dyspnea. She subsequently underwent coronary CTA on 06/30/2020 which showed nonobstructive CAD as outlined below. In this setting, she was started on losartan to further optimize her medical therapy. She was seen by pulmonology on 07/12/2020,in follow up with noted minimal obstructive, if any, and mild restrictive lung disease. She was seen in the office on 07/20/2020 and continued to feel about the same with stable exertional dyspnea that has been present for the past several years. She did not notice any changes in her dyspnea following the addition of inhaler therapy by pulmonology. With continued PVCs, Toprol-XL was titrated to 37.5 mg daily. She was also started on spironolactone 12.5 mg.  She was seen in the office on 08/22/2020 noting a 3-day history of increased chest congestion, shortness of breath, and nonproductive cough.  Her weight was stable.  Lasix 20 mg daily for 5 days was added.  I last saw her on 09/07/2020  at which time she noted an improvement in her dyspnea while on scheduled low-dose Lasix 20 mg daily with a return in her dyspnea and exertional fatigue while being off this medication.  In  this setting, she was placed on scheduled low-dose Lasix 20 mg daily.  She was seen by Dr. Lalla Brothers on 10/05/2020 for further evaluation of her NICM and left bundle branch block.  She was placed on Entresto with recommendation to follow-up echo in approximately 3 months.  If her cardiomyopathy persisted CRT-D was recommended.  She was also referred to the advanced heart failure clinic in [facility] independently reviewed: *** 08/2020 - potassium 4.2, BUN 18, serum creatinine 0.75 05/2020 - TC 68, TG 127, HDL 35, LDL 10, A1c 6.7, magnesium 1.9 11/2019-Hgb 13.2, PLT 306, albumin 4.0, AST normal, ALT 48 07/2019-TSHnormal  Past Medical History:  Diagnosis Date  . Anxiety   . Arthritis    lower back  . COPD (chronic obstructive pulmonary disease) (HCC)   . Depression   . Diabetes mellitus without complication (HCC)   . GERD (gastroesophageal reflux disease)   . Hyperlipidemia   . Morbid obesity (HCC)   . NICM (nonischemic cardiomyopathy) (HCC)    a. 03/2019 Echo: Nl LVEF; b. 09/04/2019 Echo: EF 25-30%, Gr1 DD, glob HK, nl RV fxn; c. 09/11/2019 Cath: nonobs LAD/RCA dzs. EF 50-55%. RA 10, RV 29/8(11), PA 27/16(20), PCWP 12. CO/CI 5.04/2.51.  Marland Kitchen Nonobstructive CAD (coronary artery disease)    a. 03/2019 [facility]): EF 63%, ant ischemia; b. 08/2019 Cath: LM nl, LAD 52m, RCA 70p (iFR nl @ 0.97), 50p. EF 50-55%-->Med Rx.  . Osteoporosis   . Panic attack   . PTSD (post-traumatic stress disorder)   . Rectal fistula   . Sleep apnea    a. Prev wore CPAP but lost insurance and hasn't been using since (now has insurance).  . Wears dentures    full upper    Past Surgical History:  Procedure Laterality Date  . ABDOMINAL HYSTERECTOMY    . CARDIAC CATHETERIZATION    . COLONOSCOPY  2014  . COLONOSCOPY WITH PROPOFOL N/A 10/01/2016   Procedure: COLONOSCOPY WITH PROPOFOL;  Surgeon: Midge Minium, MD;  Location: [facility];  Service: Endoscopy;  Laterality: N/A;  . CORONARY  PRESSURE WIRE/FFR WITH 3D MAPPING N/A 09/11/2019   Procedure: Coronary Pressure Wire/FFR w/3D Mapping;  Surgeon: Yvonne Kendall, MD;  Location: [facility];  Service: Cardiovascular;  Laterality: N/A;  . CYSTOCELE REPAIR N/A 12/17/2016   Procedure: ANTERIOR REPAIR (CYSTOCELE);  Surgeon: Herold Harms, MD;  Location: [facility];  Service: Gynecology;  Laterality: N/A;  . ESOPHAGOGASTRODUODENOSCOPY (EGD) WITH PROPOFOL N/A 08/11/2019   Procedure: ESOPHAGOGASTRODUODENOSCOPY (EGD) WITH PROPOFOL;  Surgeon: Midge Minium, MD;  Location: [facility];  Service: Endoscopy;  Laterality: N/A;  . POLYPECTOMY  10/01/2016   Procedure: POLYPECTOMY;  Surgeon: Midge Minium, MD;  Location: [facility];  Service: Endoscopy;;  . RECTAL SURGERY  06/28/2015   Rectal prolapse, laparoscopic rectopexy the coldCharmont, MD; [facility]  . RIGHT/LEFT HEART CATH AND CORONARY ANGIOGRAPHY Bilateral 09/11/2019   Procedure: RIGHT/LEFT HEART CATH AND CORONARY ANGIOGRAPHY;  Surgeon: Antonieta Iba, MD;  Location: [facility];  Service: Cardiovascular;  Laterality: Bilateral;  . TUBAL LIGATION    . VAGINAL HYSTERECTOMY Bilateral 12/17/2016   Procedure: HYSTERECTOMY VAGINAL WITH BILATERAL SALPINGO OOPHERECTOMY;  Surgeon: Herold Harms, MD;  Location: [facility];  Service: Gynecology;  Laterality: Bilateral;    Current Medications:  No outpatient medications have been marked as taking for the 10/24/20 encounter (Appointment) with Rise Mu, PA-C.    Allergies:   Lisinopril   Social History   Socioeconomic History  . Marital status: Married    Spouse name: Paediatric nurse  . Number of children: 1  . Years of education: Not on file  . Highest education level: 9th grade  Occupational History    Employer: DISABLED    Comment: for panic attacks  Tobacco Use  . Smoking status: Former Smoker    Packs/day: 0.50    Years: 47.00    Pack years: 23.50    Types: Cigarettes    Quit date: 11/29/2018    Years  since quitting: 1.8  . Smokeless tobacco: Never Used  . Tobacco comment: started at age 73  Vaping Use  . Vaping Use: Never used  Substance and Sexual Activity  . Alcohol use: No    Alcohol/week: 0.0 standard drinks  . Drug use: No  . Sexual activity: Not Currently  Other Topics Concern  . Not on file  Social History Narrative   Pt had one son, who died when was 46 YO in a MVA.   Social Determinants of Health   Financial Resource Strain: Low Risk   . Difficulty of Paying Living Expenses: Not hard at all  Food Insecurity: No Food Insecurity  . Worried About Charity fundraiser in the Last Year: Never true  . Ran Out of Food in the Last Year: Never true  Transportation Needs: No Transportation Needs  . Lack of Transportation (Medical): No  . Lack of Transportation (Non-Medical): No  Physical Activity: Inactive  . Days of Exercise per Week: 0 days  . Minutes of Exercise per Session: 0 min  Stress: Stress Concern Present  . Feeling of Stress : Very much  Social Connections: Socially Isolated  . Frequency of Communication with Friends and Family: Twice a week  . Frequency of Social Gatherings with Friends and Family: Never  . Attends Religious Services: Never  . Active Member of Clubs or Organizations: No  . Attends Archivist Meetings: Never  . Marital Status: Married     Family History:  The patient's family history includes Cancer in her sister; Diabetes in her brother, brother, mother, sister, and sister; Healthy in her sister; Heart disease in her brother and father; Hypertension in her father; Lymphoma in her brother and brother. There is no history of Breast cancer, Ovarian cancer, or Colon cancer.  ROS:   ROS   EKGs/Labs/Other Studies Reviewed:    Studies reviewed were summarized above. The additional studies were reviewed today:  Coronary CTA 06/30/2020: Aorta: Normal size. Mild aortic atherosclerosis. No dissection.  Aortic Valve: Trileaflet. No  calcifications.  Coronary Arteries: Normal coronary origin. Right dominance.  RCA is a large dominant artery that gives rise to PDA and PLVB. There is mild (25-49%) soft plaque stenosis in the proximal to mid vessel followed by mild (25-49%) mixed plaque stenosis.  Left main is a large artery that gives rise to LAD and LCX arteries.  LAD is a large vessel that gives rise to medium size D1 and D2 and small D3; there is long, soft plaque stenosis in the mid vessel mild (25-49%) followed by moderate (50-69%).  LCX is a non-dominant artery that gives rise to one large OM1 branch. There is no plaque.  Other findings:  Normal pulmonary vein drainage into the left atrium.  Normal let atrial appendage without a  thrombus.  Normal size of the pulmonary artery.  IMPRESSION: 1. Coronary calcium score of 39.3. This was 58 percentile for age and sex matched control.  2. Normal coronary origin with right dominance.  3. Moderate (50-69%) soft plaque stenosis in the mid LAD; mild (25-49%) soft and mixed plaque stenoses in the proximal to mid RCA.  4. Study will be sent for FFR. __________  FFR 07/01/2020: 1. Left Main: findings 0.99, 0.99  2. LAD: findings 0.99, 0.97 0.92  3. OM: Findings 0.98, 0.88 0.84  4. RCA: findings 0.97, 0.81 0.84  IMPRESSION: FFR not suggestive of significant obstructive disease. __________  Coronary CTA 06/09/2020: Unable to be performed secondary to heart rate -Calcium score 2.67 which places the patient in the 49th percentile for age and sex matched control __________   2D echo 05/30/2020: 1. Left ventricular ejection fraction, by estimation, is 30 to 35%. The  left ventricle has moderately decreased function. Left ventricular  endocardial border not optimally defined to evaluate regional wall motion.  Left ventricular diastolic parameters  are consistent with Grade II diastolic dysfunction (pseudonormalization).  2. Right  ventricular systolic function was not well visualized. The right  ventricular size is normal. Tricuspid regurgitation signal is inadequate  for assessing PA pressure.  3. The mitral valve is normal in structure. Mild mitral valve  regurgitation. No evidence of mitral stenosis.  4. The aortic valve was not well visualized. Aortic valve regurgitation  is not visualized. No aortic stenosis is present.  5. The inferior vena cava is normal in size with greater than 50%  respiratory variability, suggesting right atrial pressure of 3 mmHg.  6. Challenging image quality. __________  2D echo 08/2019: 1. Left ventricular ejection fraction, by visual estimation, is 25 to  30%. The left ventricle has moderate to severely decreased function. There  is no left ventricular hypertrophy.  2. Left ventricular diastolic parameters are consistent with Grade I  diastolic dysfunction (impaired relaxation).  3. The left ventricle demonstrates global hypokinesis.  4. Global right ventricle has normal systolic function.The right  ventricular size is normal. No increase in right ventricular wall  thickness.  5. Left atrial size was normal.  6. TR signal is inadequate for assessing pulmonary artery systolic  Pressure. __________  [facility] 08/2019: Coronary angiography:  Coronary dominance: Right  Left mainstem: Large vessel that bifurcates into the LAD and left circumflex, no significant disease noted  Left anterior descending (LAD): Large vessel that extends to the apical region, diagonal branch 2 of moderate size, mild proximal LAD disease estimated at 30%  Left circumflex (LCx): Large vessel with OM branch 2, no significant disease noted  Right coronary artery (RCA): Right dominant vessel with PL and PDA, 70% proximal RCA disease followed by sequential 50% lesion proximal to mid vessel  Left ventriculography: Left ventricular systolic function is normal, LVEF is estimated at 50 to 55%,  there is no significant mitral regurgitation , no significant aortic valve stenosis  Right heart pressures RA 10 RV 29/8, 11 PA 27/16 mean 20 Wedge 12 Cardiac output 5.04 Cardiac index 2.51  Final Conclusions:  Single-vessel disease RCA Discussed with interventional cardiology, they will perform FFR to determine if intervention needed Right heart pressures are normal   Recommendations:  We will defer coronary intervention to interventional cardiology Needs weight loss, management of COPD Management of hyperlipidemia __________  iFR 08/2019:  Conclusions: 1. Single vessel coronary with sequential 70% and 50% proximal and mid RCA stenoses that are not hemodynamically significant (iFR =  0.97). See Dr. Donivan Scull diagnostic catheterization report for further details of right/left heart catheterization.  Recommendations: 1. Medical therapy and risk factor modification to prevent progression of disease.   EKG:  EKG is ordered today.  The EKG ordered today demonstrates ***  Recent Labs: 11/30/2019: ALT 48; Hemoglobin 13.2; Platelets 306 06/10/2020: Magnesium 1.9 08/30/2020: BUN 18; Creatinine, Ser 0.75; Potassium 4.2; Sodium 141  Recent Lipid Panel    Component Value Date/Time   CHOL 68 (L) 06/15/2020 1552   TRIG 127 06/15/2020 1552   HDL 35 (L) 06/15/2020 1552   CHOLHDL 1.5 11/30/2019 1135   CHOLHDL 1.7 01/01/2018 0000   VLDL 29 08/06/2016 0824   LDLCALC 10 06/15/2020 1552   LDLCALC 29 01/01/2018 0000    PHYSICAL EXAM:    VS:  There were no vitals taken for this visit.  BMI: There is no height or weight on file to calculate BMI.  Physical Exam  Wt Readings from Last 3 Encounters:  10/05/20 239 lb (108.4 kg)  09/07/20 235 lb (106.6 kg)  08/22/20 235 lb (106.6 kg)     ASSESSMENT & PLAN:   1. HFrEF secondary to NICM with underlying LBBB: ***  2. Nonobstructive CAD:  3. PVCs:  4. HLD: LDL 10.  5. COPD/restrictive lung disease:  6. Sleep  apnea:  7. Morbid obesity/deconditioning:  Disposition: F/u with Dr. Rockey Situ or an APP in ***, and EP as directed.   Medication Adjustments/Labs and Tests Ordered: Current medicines are reviewed at length with the patient today.  Concerns regarding medicines are outlined above. Medication changes, Labs and Tests ordered today are summarized above and listed in the Patient Instructions accessible in Encounters.   Signed, Christell Faith, PA-C 10/17/2020 9:24 AM     [place] [address] [place], [place] [address] [phone]

## 2020-10-17 NOTE — Progress Notes (Signed)
BP 134/80    Pulse 99    Temp (!) 94.5 F (34.7 C)    Wt 236 lb 8 oz (107.3 kg)    SpO2 96%    BMI 41.89 kg/m    Subjective:    Patient ID: Kim Buchanan, female    DOB: 12/30/1950, 69 y.o.   MRN: [id number]  HPI: Kim Buchanan is a 69 y.o. female  Chief Complaint  Patient presents with   Diabetes   Hypertension   Hyperlipidemia   Insomnia   DIABETES Last A1C in May was 7.2%.  She continues on Metformin 500 MG BID and Farxiga 5 MG daily -- has never taken 1000 MG twice a day of Metformin.  Last GFR >60.   Hypoglycemic episodes:no Polydipsia/polyuria: no Visual disturbance: no Chest pain: no Paresthesias: no Glucose Monitoring: no  Accucheck frequency: Not Checking due to poor diet adherence  Fasting glucose:  Post prandial:  Evening:  Before meals: Taking Insulin?: no  Long acting insulin:  Short acting insulin: Blood Pressure Monitoring: rarely Retinal Examination: Not up to Date -- she reports having at [place] first of year 2022 Foot Exam: Up to Date Pneumovax: Up to Date Influenza: Up to Date Aspirin: yes   COPD No current inhalers.  Last saw pulmonary on 07/12/20 with minimal obstruction and mild restriction noted on PFT by them -- she was given trial of Stiolto via samples -- this offered her no benefit.  Past smoker, she smoked from age 77 to age 25 -- smoked 1 1/2 PPD.  Has not had lung CA screening. COPD status: stable Satisfied with current treatment?: yes Oxygen use: no Dyspnea frequency: at baseline, no worsening Cough frequency: occasional Rescue inhaler frequency:  none Limitation of activity: no Productive cough: none Last Spirometry: with pulmonary Pneumovax: Up to Date Influenza: Up to Date  HYPERTENSION / HYPERLIPIDEMIA/HF Followed by cardiology and last seen 10/05/20 -- with decision to trial Entresto 24/26 MG and stopping Losartan.  She reports this change is making her cough more and she can not take it.  Echo on 09/09/20 noted  EF 30% with LVF moderately decreased and LBBB -- plan for repeat echo in March and if persistent low EF there is plan for CRT-D implant.  Having cardiac CT in September.  Continues on Imdur, Atorvastatin, Metoprolol XL, Lasix, Spironolactone, and ASA.  History of aortic atherosclerosis noted on past imaging.  Has history of cough with ACE and ARB in past.   Satisfied with current treatment? yes Duration of hypertension: chronic BP monitoring frequency: rarely BP range:  BP medication side effects: no Duration of hyperlipidemia: chronic Cholesterol medication side effects: no Cholesterol supplements: none Medication compliance: good compliance Aspirin: yes Recent stressors: no Recurrent headaches: no Visual changes: no Palpitations: no Dyspnea: at baseline Chest pain: no Lower extremity edema: no Dizzy/lightheaded: no   INSOMNIA Continues on Seroquel 100 MG QHS started by previous PCP.  She reports "they say I do" have sleep apnea, does not use CPAP at this time -- used years ago.    Pt is aware of risks of psychoactive medication use to include increased sedation, respiratory suppression, falls, extrapyramidal movements,  dependence and cardiovascular events.  Pt would like to continue treatment as benefit determined to outweigh risk.  Duration: chronic Satisfied with sleep quality: no Difficulty falling asleep: no Difficulty staying asleep: yes Waking a few hours after sleep onset: yes Early morning awakenings: yes Daytime hypersomnolence: no Wakes feeling refreshed: no Good sleep hygiene: yes  Apnea: yes Snoring: no Depressed/anxious mood: no Recent stress: no Restless legs/nocturnal leg cramps: no Chronic pain/arthritis: no History of sleep study: yes Treatments attempted: melatonin   Relevant past medical, surgical, family and social history reviewed and updated as indicated. Interim medical history since our last visit reviewed. Allergies and medications reviewed and  updated.  Review of Systems  Constitutional: Negative for activity change, appetite change, diaphoresis, fatigue and fever.  Respiratory: Negative for cough, chest tightness and shortness of breath.   Cardiovascular: Negative for chest pain, palpitations and leg swelling.  Gastrointestinal: Negative.   Endocrine: Negative for cold intolerance, heat intolerance, polydipsia, polyphagia and polyuria.  Neurological: Negative.   Psychiatric/Behavioral: Negative.     Per HPI unless specifically indicated above     Objective:    BP 134/80    Pulse 99    Temp (!) 94.5 F (34.7 C)    Wt 236 lb 8 oz (107.3 kg)    SpO2 96%    BMI 41.89 kg/m   Wt Readings from Last 3 Encounters:  10/17/20 236 lb 8 oz (107.3 kg)  10/05/20 239 lb (108.4 kg)  09/07/20 235 lb (106.6 kg)    Physical Exam Vitals and nursing note reviewed.  Constitutional:      General: She is awake. She is not in acute distress.    Appearance: She is well-developed and well-groomed. She is morbidly obese. She is not ill-appearing.  HENT:     Head: Normocephalic.     Right Ear: Hearing normal.     Left Ear: Hearing normal.  Eyes:     General: Lids are normal.        Right eye: No discharge.        Left eye: No discharge.     Conjunctiva/sclera: Conjunctivae normal.     Pupils: Pupils are equal, round, and reactive to light.  Neck:     Thyroid: No thyromegaly.     Vascular: No carotid bruit.  Cardiovascular:     Rate and Rhythm: Normal rate and regular rhythm.     Heart sounds: Normal heart sounds. No murmur heard. No gallop.   Pulmonary:     Effort: Pulmonary effort is normal. No accessory muscle usage or respiratory distress.     Breath sounds: Normal breath sounds.  Abdominal:     General: Bowel sounds are normal.     Palpations: Abdomen is soft.  Musculoskeletal:     Cervical back: Normal range of motion and neck supple.     Right lower leg: No edema.     Left lower leg: No edema.  Skin:    General: Skin is  warm and dry.  Neurological:     Mental Status: She is alert and oriented to person, place, and time.  Psychiatric:        Attention and Perception: Attention normal.        Mood and Affect: Mood normal.        Speech: Speech normal.        Behavior: Behavior normal. Behavior is cooperative.        Thought Content: Thought content normal.    Results for orders placed or performed in visit on 09/09/20  ECHOCARDIOGRAM LIMITED  Result Value Ref Range   Single Plane A4C EF 29.1 %   Single Plane A2C EF 35.7 %   Calc EF 32.5 %      Assessment & Plan:   Problem List Items Addressed This Visit  Cardiovascular and Mediastinum   Hypertension associated with diabetes ([place])    Chronic, ongoing with BP at goal today.  Continue to collaborate with cardiology, recent notes and echo reviewed.  Continue current medication regimen and adjust as needed.  History of cough with ACE.  Recommend she monitor BP at home at least 3 days a week and document for providers.  Focus on DASH diet.  BMP today.  Return to office in 3 months.      Relevant Medications   metFORMIN (GLUCOPHAGE) 1000 MG tablet   dapagliflozin propanediol (FARXIGA) 10 MG TABS tablet   NICM (nonischemic cardiomyopathy) ([place])    Continue collaboration with cardiology and current medication regimen as prescribed by them.  Recent note reviewed.      Aortic atherosclerosis ([place])    Noted on CT cardiac imaging 06/09/20.  Will continue ASA and statin for prevention.  Continue to collaborate with cardiology.      HFrEF (heart failure with reduced ejection fraction) (HCC)    Chronic, ongoing.  Followed by cardiology with recent addition of Entresto -- will alert them she is not taking Entresto.  Educated her on HF today.  Increase Farxiga to 10 MG daily.  Recommend: - Reminded to call for an overnight weight gain of >2 pounds or a weekly weight weight of >5 pounds - not adding salt to his food and has been reading food labels. Reviewed  the importance of keeping daily sodium intake to 2000mg  daily          Respiratory   COPD (chronic obstructive pulmonary disease) (HCC)    Chronic, ongoing with no current inhalers.  Reached out to [employer] with lung screening team and he will reach out to patient today to get her scheduled for CT imaging, patient aware.  Continue collaboration with pulmonary and monitoring.      OSA (obstructive sleep apnea)    No current CPAP due to cost, continue to work with CCM team.        Endocrine   Hyperlipidemia associated with type 2 diabetes mellitus ([place])    Chronic, ongoing.  Continue current medication regimen and adjust as needed.  Lipid panel next visit.      Relevant Medications   metFORMIN (GLUCOPHAGE) 1000 MG tablet   dapagliflozin propanediol (FARXIGA) 10 MG TABS tablet   Type 2 diabetes mellitus with peripheral neuropathy (HCC) - Primary    Chronic, ongoing with A1C 7.7% today, endorses poor diet adherence, and urine ALB 30 last visit.  Can not use ACE due to past history of cough.  Will increase Metformin to 1000 MG BID and Farxiga to 10 MG daily, this also benefits her HF.  Educated patient on medications and that if ongoing elevations will consider addition of GLP1 next visit, which may benefit weight loss.  Recommend she monitor BS daily and document.  Focus on diabetes diet and regular activity.  Return to office in 3 months.      Relevant Medications   metFORMIN (GLUCOPHAGE) 1000 MG tablet   dapagliflozin propanediol (FARXIGA) 10 MG TABS tablet   Other Relevant Orders   Bayer DCA Hb A1c Waived   Basic metabolic panel   TSH     Other   Insomnia    Chronic, ongoing.  Likely related to OSA issues, continue collaboration with pulmonary -- may benefit from CPAP in future.  Discussed with patient Seroquel and risks, do not feel comfortable increasing dose due to her current cardiac risk factors and age.  Recommend she take Melatonin, may take up to 10 MG, at night  along with her Seroquel -- and try to reduce dose of Seroquel in future if possible.  She reports no benefit from Trazodone in past.  Could consider change to Belsomra in future.  Return in 3 months.      Morbid obesity (HCC)    BMI 41.89 with T2DM and HTN + HF.  Recommended eating smaller high protein, low fat meals more frequently and exercising 30 mins a day 5 times a week with a goal of 10-15lb weight loss in the next 3 months. Patient voiced their understanding and motivation to adhere to these recommendations.       Relevant Medications   metFORMIN (GLUCOPHAGE) 1000 MG tablet   dapagliflozin propanediol (FARXIGA) 10 MG TABS tablet       Follow up plan: Return in about 3 months (around 01/15/2021) for T2DM, HTN/HLD, INSOMNIA, COPD.

## 2020-10-17 NOTE — Assessment & Plan Note (Signed)
Noted on CT cardiac imaging 06/09/20.  Will continue ASA and statin for prevention.  Continue to collaborate with cardiology. 

## 2020-10-17 NOTE — Patient Instructions (Signed)
I have increased Metformin to 1000 MG twice a day and Farxiga to 10 MG once daily -- I sent these to [facility] -- continue current medications until your receive new dose and then stop current medications and start new doses.  NEXT VISIT WE MAY ADD EITHER TRULICITY OR OZEMPIC.   Diabetes Mellitus and Nutrition, Adult When you have diabetes (diabetes mellitus), it is very important to have healthy eating habits because your blood sugar (glucose) levels are greatly affected by what you eat and drink. Eating healthy foods in the appropriate amounts, at about the same times every day, can help you:  Control your blood glucose.  Lower your risk of heart disease.  Improve your blood pressure.  Reach or maintain a healthy weight. Every person with diabetes is different, and each person has different needs for a meal plan. Your health care provider may recommend that you work with a diet and nutrition specialist (dietitian) to make a meal plan that is best for you. Your meal plan may vary depending on factors such as:  The calories you need.  The medicines you take.  Your weight.  Your blood glucose, blood pressure, and cholesterol levels.  Your activity level.  Other health conditions you have, such as heart or kidney disease. How do carbohydrates affect me? Carbohydrates, also called carbs, affect your blood glucose level more than any other type of food. Eating carbs naturally raises the amount of glucose in your blood. Carb counting is a method for keeping track of how many carbs you eat. Counting carbs is important to keep your blood glucose at a healthy level, especially if you use insulin or take certain oral diabetes medicines. It is important to know how many carbs you can safely have in each meal. This is different for every person. Your dietitian can help you calculate how many carbs you should have at each meal and for each snack. Foods that contain carbs include:  Bread, cereal,  rice, pasta, and crackers.  Potatoes and corn.  Peas, beans, and lentils.  Milk and yogurt.  Fruit and juice.  Desserts, such as cakes, cookies, ice cream, and candy. How does alcohol affect me? Alcohol can cause a sudden decrease in blood glucose (hypoglycemia), especially if you use insulin or take certain oral diabetes medicines. Hypoglycemia can be a life-threatening condition. Symptoms of hypoglycemia (sleepiness, dizziness, and confusion) are similar to symptoms of having too much alcohol. If your health care provider says that alcohol is safe for you, follow these guidelines:  Limit alcohol intake to no more than 1 drink per day for nonpregnant women and 2 drinks per day for men. One drink equals 12 oz of beer, 5 oz of wine, or 1 oz of hard liquor.  Do not drink on an empty stomach.  Keep yourself hydrated with water, diet soda, or unsweetened iced tea.  Keep in mind that regular soda, juice, and other mixers may contain a lot of sugar and must be counted as carbs. What are tips for following this plan?  Reading food labels  Start by checking the serving size on the "Nutrition Facts" label of packaged foods and drinks. The amount of calories, carbs, fats, and other nutrients listed on the label is based on one serving of the item. Many items contain more than one serving per package.  Check the total grams (g) of carbs in one serving. You can calculate the number of servings of carbs in one serving by dividing the total carbs  by 15. For example, if a food has 30 g of total carbs, it would be equal to 2 servings of carbs.  Check the number of grams (g) of saturated and trans fats in one serving. Choose foods that have low or no amount of these fats.  Check the number of milligrams (mg) of salt (sodium) in one serving. Most people should limit total sodium intake to less than 2,300 mg per day.  Always check the nutrition information of foods labeled as "low-fat" or "nonfat".  These foods may be higher in added sugar or refined carbs and should be avoided.  Talk to your dietitian to identify your daily goals for nutrients listed on the label. Shopping  Avoid buying canned, premade, or processed foods. These foods tend to be high in fat, sodium, and added sugar.  Shop around the outside edge of the grocery store. This includes fresh fruits and vegetables, bulk grains, fresh meats, and fresh dairy. Cooking  Use low-heat cooking methods, such as baking, instead of high-heat cooking methods like deep frying.  Cook using healthy oils, such as olive, canola, or sunflower oil.  Avoid cooking with butter, cream, or high-fat meats. Meal planning  Eat meals and snacks regularly, preferably at the same times every day. Avoid going long periods of time without eating.  Eat foods high in fiber, such as fresh fruits, vegetables, beans, and whole grains. Talk to your dietitian about how many servings of carbs you can eat at each meal.  Eat 4-6 ounces (oz) of lean protein each day, such as lean meat, chicken, fish, eggs, or tofu. One oz of lean protein is equal to: ? 1 oz of meat, chicken, or fish. ? 1 egg. ?  cup of tofu.  Eat some foods each day that contain healthy fats, such as avocado, nuts, seeds, and fish. Lifestyle  Check your blood glucose regularly.  Exercise regularly as told by your health care provider. This may include: ? 150 minutes of moderate-intensity or vigorous-intensity exercise each week. This could be brisk walking, biking, or water aerobics. ? Stretching and doing strength exercises, such as yoga or weightlifting, at least 2 times a week.  Take medicines as told by your health care provider.  Do not use any products that contain nicotine or tobacco, such as cigarettes and e-cigarettes. If you need help quitting, ask your health care provider.  Work with a Veterinary surgeon or diabetes educator to identify strategies to manage stress and any  emotional and social challenges. Questions to ask a health care provider  Do I need to meet with a diabetes educator?  Do I need to meet with a dietitian?  What number can I call if I have questions?  When are the best times to check my blood glucose? Where to find more information:  American Diabetes Association: diabetes.org  Academy of Nutrition and Dietetics: www.eatright.[employer] of Diabetes and Digestive and Kidney Diseases (NIH): CarFlippers.tn Summary  A healthy meal plan will help you control your blood glucose and maintain a healthy lifestyle.  Working with a diet and nutrition specialist (dietitian) can help you make a meal plan that is best for you.  Keep in mind that carbohydrates (carbs) and alcohol have immediate effects on your blood glucose levels. It is important to count carbs and to use alcohol carefully. This information is not intended to replace advice given to you by your health care provider. Make sure you discuss any questions you have with your health care  provider. Document Revised: 09/20/2017 Document Reviewed: 11/12/2016 Elsevier Patient Education  2020 [employer].

## 2020-10-17 NOTE — Assessment & Plan Note (Addendum)
Chronic, ongoing.  Followed by cardiology with recent addition of Entresto -- will alert them she is not taking Entresto.  Educated her on HF today.  Increase Farxiga to 10 MG daily.  Recommend: - Reminded to call for an overnight weight gain of >2 pounds or a weekly weight weight of >5 pounds - not adding salt to his food and has been reading food labels. Reviewed the importance of keeping daily sodium intake to 2000mg  daily

## 2020-10-18 LAB — BASIC METABOLIC PANEL
BUN/Creatinine Ratio: 19 (ref 12–28)
BUN: 17 mg/dL (ref 8–27)
CO2: 20 mmol/L (ref 20–29)
Calcium: 9.4 mg/dL (ref 8.7–10.3)
Chloride: 99 mmol/L (ref 96–106)
Creatinine, Ser: 0.88 mg/dL (ref 0.57–1.00)
GFR calc Af Amer: 79 mL/min/{1.73_m2} (ref >59)
GFR calc non Af Amer: 68 mL/min/{1.73_m2} (ref >59)
Glucose: 246 mg/dL — ABNORMAL HIGH (ref 65–99)
Potassium: 4.2 mmol/L (ref 3.5–5.2)
Sodium: 141 mmol/L (ref 134–144)

## 2020-10-18 LAB — TSH: TSH: 2.44 u[IU]/mL (ref 0.450–4.500)

## 2020-10-18 NOTE — Progress Notes (Signed)
Contacted via MyChart   Good afternoon Kim Buchanan, your kidney function remains stable on these labs and thyroid is normal.  If any questions please let me know.  Have a great day!! Keep being awesome!!  Thank you for allowing me to participate in your care. Kindest regards, Abiola Behring

## 2020-10-24 ENCOUNTER — Ambulatory Visit: Payer: Medicare Other | Admitting: Physician Assistant

## 2020-10-24 NOTE — Progress Notes (Signed)
Cardiology Office Note    Date:  10/28/2020   ID:  ERI MCEVERS, DOB 10-08-1951, MRN [id number]  PCP:  Venita Lick, NP  Cardiologist:  Ida Rogue, MD  Electrophysiologist:  [name], MD   Chief Complaint: Follow up  History of Present Illness:   LARNA CAPELLE is a 70 y.o. female with history of nonobstructive CAD, HFrEF secondary toNICM,COPD secondary to tobacco use quitting in 11/2018,restrictive lung disease,DM2, HLD, chronic dyspnea, obesity, untreated sleep apnea, GERD, anxiety, and depression who presents for follow-up of her cardiomyopathy.  She was previously followed by Dr. Jenita Seashore underwent echo in 03/2019 which showed normal LV systolic function. Stress testing was undertaken in the setting of ongoing dyspnea which showed anterior ischemia. Following this, there was recommendation for coronary CTA though the patient did not follow-up and was subsequently evaluated by pulmonology. In this setting,she was referred to Dr. Rockey Situ who saw the patient in 07/2019 for follow-up of the above prior cardiac testing. Records were not available at that time, therefore, sheunderwent repeat echo in 08/2019,which revealed an EF of 25 to 30%, global hypokinesis, and grade 1 diastolic dysfunction. There was no significant valvular disease. Given her new cardiomyopathy, she underwent diagnostic cardiac cath in 08/2019 which revealed a 70% stenosis in the proximal RCA. iFR was normal at 0.97. Medical therapy was recommended. Of note, LV gram was performed and showed an EF of 50 to 55%. Right heart cath showed normal filling pressures. She was seen in the office in 09/2019 and was doing well from a cardiac perspective. She was noted to have resolution of cough following discontinuation of losartan. She was started on low-dose carvedilol and referred to cardiopulmonary rehab.   She was seen in the office on 05/19/2020,and noted stable symptoms over the past  couple of years. She followed up with pulmonology with lung disease felt to be less likely the etiology of her symptoms, with mild restrictive physiology noted on PFTs. She was unable to participate in cardiopulmonary rehab secondary to dyspnea. It was noted she did not tolerate Coreg secondary to cough and self discontinued it. She underwent echo, per pulmonology on 05/30/2020, which showed an EF of 30-35%, Gr2DD, normal RV cavity size, and mild MR.She was scheduled for coronary CTA on 06/09/2020, whichwas unable to be performed secondary to heart rates.She was able to undergo calcium scoring with a calcium score of 2.67 which placed the patient in the 49th percentile for age and sex matched control.She was seen in follow up on 8/20/2021and continued to feel about the same with ongoing exertional dyspnea. She subsequently underwent coronary CTA on 06/30/2020 which showed nonobstructive CAD as outlined below. In this setting, she was started on losartan to further optimize her medical therapy. She was seen by pulmonology on 07/12/2020,in follow up with noted minimal obstructive, if any, and mild restrictive lung disease. She was seen in the office on 07/20/2020 and continued to feel about the same with stable exertional dyspnea that has been present for the past several years. She did not notice any changes in her dyspnea following the addition of inhaler therapy by pulmonology. With continued PVCs, Toprol-XL was titrated to 37.5 mg daily. She was also started on spironolactone 12.5 mg.  She was seen in the office on 08/22/2020 noting a 3-day history of increased chest congestion, shortness of breath, and nonproductive cough.  Her weight was stable.  Lasix 20 mg daily for 5 days was added.  I last saw her on 09/07/2020  at which time she noted an improvement in her dyspnea while on scheduled low-dose Lasix 20 mg daily with a return in her dyspnea and exertional fatigue while being off this medication.  In  this setting, she was placed on scheduled low-dose Lasix 20 mg daily.  She was seen by Dr. Quentin Ore on 10/05/2020 for further evaluation of her NICM and left bundle branch block.  She was placed on Entresto with recommendation to follow-up echo in approximately 3 months.  If her cardiomyopathy persisted CRT-D was recommended.  She was also referred to the advanced heart failure clinic in [place]  She comes in feeling tired today her dyspnea is about the same.  No chest pain.  She did not tolerate Entresto secondary to cough and has self discontinued this medication.  She indicates that she will not be able to afford [name] moving forward though we will continue her current supply.  She is working with her insurance company to see if she can get this cheaper.  Her weight remains stable.  No lower extremity swelling with stable two-pillow orthopnea noted.  She is drinking a little more than 2 L of fluid per day.  She indicates that she is constantly hungry.  She is not adding salt to her food.  No dizziness, presyncope, or syncope.  No falls.  She has follow-up with EP in 12/2020.   Labs independently reviewed: 10/2020 - potassium 4.4, BUN 23, serum creatinine 0.78 05/2020 - TC 68, TG 127, HDL 35, LDL 10, A1c 6.7, magnesium 1.9 11/2019-Hgb 13.2, PLT 306, albumin 4.0, AST normal, ALT 48 07/2019-TSH normal   Past Medical History:  Diagnosis Date  . Anxiety   . Arthritis    lower back  . COPD (chronic obstructive pulmonary disease) ([place])   . Depression   . Diabetes mellitus without complication ([place])   . GERD (gastroesophageal reflux disease)   . Hyperlipidemia   . Morbid obesity ([place])   . NICM (nonischemic cardiomyopathy) ([place])    a. 03/2019 Echo: Nl LVEF; b. 09/04/2019 Echo: EF 25-30%, Gr1 DD, glob HK, nl RV fxn; c. 09/11/2019 Cath: nonobs LAD/RCA dzs. EF 50-55%. RA 10, RV 29/8(11), PA 27/16(20), PCWP 12. CO/CI 5.04/2.51.  Marland Kitchen Nonobstructive CAD (coronary artery disease)    a. 03/2019 MV  [facility]): EF 63%, ant ischemia; b. 08/2019 Cath: LM nl, LAD 66m RCA 70p (iFR nl @ 0.97), 50p. EF 50-55%-->Med Rx.  . Osteoporosis   . Panic attack   . PTSD (post-traumatic stress disorder)   . Rectal fistula   . Sleep apnea    a. Prev wore CPAP but lost insurance and hasn't been using since (now has insurance).  . Wears dentures    full upper    Past Surgical History:  Procedure Laterality Date  . ABDOMINAL HYSTERECTOMY    . CARDIAC CATHETERIZATION    . COLONOSCOPY  2014  . COLONOSCOPY WITH PROPOFOL N/A 10/01/2016   Procedure: COLONOSCOPY WITH PROPOFOL;  Surgeon: DLucilla Lame MD;  [facility]  Service: Endoscopy;  Laterality: N/A;  . CORONARY PRESSURE WIRE/FFR WITH 3D MAPPING N/A 09/11/2019   Procedure: Coronary Pressure Wire/FFR w/3D Mapping;  Surgeon: ENelva Bush MD;  [facility];  Service: Cardiovascular;  Laterality: N/A;  . CYSTOCELE REPAIR N/A 12/17/2016   Procedure: ANTERIOR REPAIR (CYSTOCELE);  Surgeon: MBrayton Mars MD;  Location: [facility];  Service: Gynecology;  Laterality: N/A;  . ESOPHAGOGASTRODUODENOSCOPY (EGD) WITH PROPOFOL N/A 08/11/2019   Procedure: ESOPHAGOGASTRODUODENOSCOPY (EGD) WITH PROPOFOL;  Surgeon: Lucilla Lame, MD;  Location: [facility];  Service: Endoscopy;  Laterality: N/A;  . POLYPECTOMY  10/01/2016   Procedure: POLYPECTOMY;  Surgeon: Lucilla Lame, MD;  Location: [place];  Service: Endoscopy;;  . RECTAL SURGERY  06/28/2015   Rectal prolapse, laparoscopic rectopexy the coldCharmont, MD; [facility]  . RIGHT/LEFT HEART CATH AND CORONARY ANGIOGRAPHY Bilateral 09/11/2019   Procedure: RIGHT/LEFT HEART CATH AND CORONARY ANGIOGRAPHY;  Surgeon: [name], MD;  Location: [place] [facility];  Service: Cardiovascular;  Laterality: Bilateral;  . TUBAL LIGATION    . VAGINAL HYSTERECTOMY Bilateral 12/17/2016   Procedure: HYSTERECTOMY VAGINAL WITH BILATERAL SALPINGO OOPHERECTOMY;  Surgeon: Brayton Mars, MD;  Location: [facility];  Service: Gynecology;  Laterality: Bilateral;    Current Medications: Current Meds  Medication Sig  . aspirin 81 MG tablet Take 81 mg by mouth daily.   Marland Kitchen atorvastatin (LIPITOR) 10 MG tablet Take 1 tablet (10 mg total) by mouth at bedtime.  . blood glucose meter kit and supplies Dispense based on patient and insurance preference. Check fasting blood sugar once daily. (FOR ICD E11.69).  . dapagliflozin propanediol (FARXIGA) 10 MG TABS tablet Take 1 tablet (10 mg total) by mouth daily before breakfast.  . furosemide (LASIX) 20 MG tablet Take 1 tablet (20 mg total) by mouth daily.  Marland Kitchen gabapentin (NEURONTIN) 300 MG capsule Take 1 capsule (300 mg total) by mouth 3 (three) times daily.  . isosorbide mononitrate (IMDUR) 30 MG 24 hr tablet TAKE 1 TABLET BY MOUTH  DAILY  . Lancets (ONETOUCH DELICA PLUS [id number]) MISC daily.   Marland Kitchen losartan (COZAAR) 25 MG tablet Take 0.5 tablets (12.5 mg total) by mouth daily.  . metFORMIN (GLUCOPHAGE) 1000 MG tablet Take 1 tablet (1,000 mg total) by mouth 2 (two) times daily with a meal.  . metoprolol succinate (TOPROL XL) 25 MG 24 hr tablet Take 1.5 tablets (37.5 mg total) by mouth daily.  Glory Rosebush ULTRA test strip 1 each daily.  . pantoprazole (PROTONIX) 40 MG tablet Take 40 mg by mouth daily.   . polyethylene glycol powder (GLYCOLAX/MIRALAX) 17 GM/SCOOP powder Take 1-2 doses daily as needed for constipation/to prevent constipation  . QUEtiapine (SEROQUEL) 100 MG tablet TAKE 1 TABLET BY MOUTH AT  BEDTIME  . spironolactone (ALDACTONE) 25 MG tablet Take 0.5 tablets (12.5 mg total) by mouth daily.    Allergies:   Lisinopril   Social History   Socioeconomic History  . Marital status: Married    Spouse name: Paediatric nurse  . Number of children: 1  . Years of education: Not on file  . Highest education level: 9th grade  Occupational History    Employer: DISABLED    Comment: for panic attacks  Tobacco Use  . Smoking status: Former  Smoker    Packs/day: 0.50    Years: 47.00    Pack years: 23.50    Types: Cigarettes    Quit date: 11/29/2018    Years since quitting: 1.9  . Smokeless tobacco: Never Used  . Tobacco comment: started at age 38  Vaping Use  . Vaping Use: Never used  Substance and Sexual Activity  . Alcohol use: No    Alcohol/week: 0.0 standard drinks  . Drug use: No  . Sexual activity: Not Currently  Other Topics Concern  . Not on file  Social History Narrative   Pt had one son, who died when was 89 YO in a MVA.   Social Determinants of Health   Financial Resource Strain:  Low Risk   . Difficulty of Paying Living Expenses: Not hard at all  Food Insecurity: No Food Insecurity  . Worried About Charity fundraiser in the Last Year: Never true  . Ran Out of Food in the Last Year: Never true  Transportation Needs: No Transportation Needs  . Lack of Transportation (Medical): No  . Lack of Transportation (Non-Medical): No  Physical Activity: Inactive  . Days of Exercise per Week: 0 days  . Minutes of Exercise per Session: 0 min  Stress: Stress Concern Present  . Feeling of Stress : Very much  Social Connections: Socially Isolated  . Frequency of Communication with Friends and Family: Twice a week  . Frequency of Social Gatherings with Friends and Family: Never  . Attends Religious Services: Never  . Active Member of Clubs or Organizations: No  . Attends Archivist Meetings: Never  . Marital Status: Married     Family History:  The patient's family history includes Cancer in her sister; Diabetes in her brother, brother, mother, sister, and sister; Healthy in her sister; Heart disease in her brother and father; Hypertension in her father; Lymphoma in her brother and brother. There is no history of Breast cancer, Ovarian cancer, or Colon cancer.  ROS:   Review of Systems  Constitutional: Positive for malaise/fatigue. Negative for chills, diaphoresis, fever and weight loss.  HENT:  Negative for congestion.   Eyes: Negative for discharge and redness.  Respiratory: Positive for shortness of breath. Negative for cough, hemoptysis, sputum production and wheezing.   Cardiovascular: Positive for orthopnea. Negative for chest pain, palpitations, claudication, leg swelling and PND.  Gastrointestinal: Negative for abdominal pain, blood in stool, heartburn, melena, nausea and vomiting.  Genitourinary: Negative for hematuria.  Musculoskeletal: Negative for falls and myalgias.  Skin: Negative for rash.  Neurological: Positive for weakness. Negative for dizziness, tingling, tremors, sensory change, speech change, focal weakness and loss of consciousness.  Endo/Heme/Allergies: Does not bruise/bleed easily.  Psychiatric/Behavioral: Negative for substance abuse. The patient is not nervous/anxious.   All other systems reviewed and are negative.    EKGs/Labs/Other Studies Reviewed:    Studies reviewed were summarized above. The additional studies were reviewed today:  Coronary CTA 06/30/2020: Aorta: Normal size. Mild aortic atherosclerosis. No dissection.  Aortic Valve: Trileaflet. No calcifications.  Coronary Arteries: Normal coronary origin. Right dominance.  RCA is a large dominant artery that gives rise to PDA and PLVB. There is mild (25-49%) soft plaque stenosis in the proximal to mid vessel followed by mild (25-49%) mixed plaque stenosis.  Left main is a large artery that gives rise to LAD and LCX arteries.  LAD is a large vessel that gives rise to medium size D1 and D2 and small D3; there is long, soft plaque stenosis in the mid vessel mild (25-49%) followed by moderate (50-69%).  LCX is a non-dominant artery that gives rise to one large OM1 branch. There is no plaque.  Other findings:  Normal pulmonary vein drainage into the left atrium.  Normal let atrial appendage without a thrombus.  Normal size of the pulmonary artery.  IMPRESSION: 1.  Coronary calcium score of 39.3. This was 50 percentile for age and sex matched control.  2. Normal coronary origin with right dominance.  3. Moderate (50-69%) soft plaque stenosis in the mid LAD; mild (25-49%) soft and mixed plaque stenoses in the proximal to mid RCA.  4. Study will be sent for FFR. __________  FFR 07/01/2020: 1. Left Main: findings 0.99, 0.99  2. LAD: findings 0.99, 0.97 0.92  3. OM: Findings 0.98, 0.88 0.84  4. RCA: findings 0.97, 0.81 0.84  IMPRESSION: FFR not suggestive of significant obstructive disease. __________  Coronary CTA 06/09/2020: Unable to be performed secondary to heart rate -Calcium score 2.67 which places the patient in the 49th percentile for age and sex matched control __________   2D echo 05/30/2020: 1. Left ventricular ejection fraction, by estimation, is 30 to 35%. The  left ventricle has moderately decreased function. Left ventricular  endocardial border not optimally defined to evaluate regional wall motion.  Left ventricular diastolic parameters  are consistent with Grade II diastolic dysfunction (pseudonormalization).  2. Right ventricular systolic function was not well visualized. The right  ventricular size is normal. Tricuspid regurgitation signal is inadequate  for assessing PA pressure.  3. The mitral valve is normal in structure. Mild mitral valve  regurgitation. No evidence of mitral stenosis.  4. The aortic valve was not well visualized. Aortic valve regurgitation  is not visualized. No aortic stenosis is present.  5. The inferior vena cava is normal in size with greater than 50%  respiratory variability, suggesting right atrial pressure of 3 mmHg.  6. Challenging image quality. __________  2D echo 08/2019: 1. Left ventricular ejection fraction, by visual estimation, is 25 to  30%. The left ventricle has moderate to severely decreased function. There  is no left ventricular hypertrophy.  2. Left  ventricular diastolic parameters are consistent with Grade I  diastolic dysfunction (impaired relaxation).  3. The left ventricle demonstrates global hypokinesis.  4. Global right ventricle has normal systolic function.The right  ventricular size is normal. No increase in right ventricular wall  thickness.  5. Left atrial size was normal.  6. TR signal is inadequate for assessing pulmonary artery systolic  Pressure. __________  [facility] 08/2019: Coronary angiography:  Coronary dominance: Right  Left mainstem: Large vessel that bifurcates into the LAD and left circumflex, no significant disease noted  Left anterior descending (LAD): Large vessel that extends to the apical region, diagonal branch 2 of moderate size, mild proximal LAD disease estimated at 30%  Left circumflex (LCx): Large vessel with OM branch 2, no significant disease noted  Right coronary artery (RCA): Right dominant vessel with PL and PDA, 70% proximal RCA disease followed by sequential 50% lesion proximal to mid vessel  Left ventriculography: Left ventricular systolic function is normal, LVEF is estimated at 50 to 55%, there is no significant mitral regurgitation , no significant aortic valve stenosis  Right heart pressures RA 10 RV 29/8, 11 PA 27/16 mean 20 Wedge 12 Cardiac output 5.04 Cardiac index 2.51  Final Conclusions:  Single-vessel disease RCA Discussed with interventional cardiology, they will perform FFR to determine if intervention needed Right heart pressures are normal   Recommendations:  We will defer coronary intervention to interventional cardiology Needs weight loss, management of COPD Management of hyperlipidemia __________  iFR 08/2019:  Conclusions: 1. Single vessel coronary with sequential 70% and 50% proximal and mid RCA stenoses that are not hemodynamically significant (iFR = 0.97). See Dr. Donivan Scull diagnostic catheterization report for further details of  right/left heart catheterization.  Recommendations: 1. Medical therapy and risk factor modification to prevent progression of disease.   EKG:  EKG is not ordered today.   Recent Labs: 11/30/2019: ALT 48; Hemoglobin 13.2; Platelets 306 06/10/2020: Magnesium 1.9 10/17/2020: TSH 2.440 10/27/2020: BUN 23; Creatinine, Ser 0.78; Potassium 4.4; Sodium 143  Recent Lipid Panel    Component Value Date/Time  CHOL 68 (L) 06/15/2020 1552   TRIG 127 06/15/2020 1552   HDL 35 (L) 06/15/2020 1552   CHOLHDL 1.5 11/30/2019 1135   CHOLHDL 1.7 01/01/2018 0000   VLDL 29 08/06/2016 0824   LDLCALC 10 06/15/2020 1552   LDLCALC 29 01/01/2018 0000    PHYSICAL EXAM:    VS:  BP 120/80 (BP Location: Left Arm, Patient Position: Sitting, Cuff Size: Large)   Pulse 88   Ht '5\' 3"'  (1.6 m)   Wt 241 lb (109.3 kg)   SpO2 94%   BMI 42.69 kg/m   BMI: Body mass index is 42.69 kg/m.  Physical Exam Vitals reviewed.  Constitutional:      Appearance: She is well-developed and well-nourished.  HENT:     Head: Normocephalic and atraumatic.  Eyes:     General:        Right eye: No discharge.        Left eye: No discharge.  Neck:     Vascular: No JVD.  Cardiovascular:     Rate and Rhythm: Normal rate and regular rhythm.     Pulses: No midsystolic click and no opening snap.          Posterior tibial pulses are 2+ on the right side and 2+ on the left side.     Heart sounds: Normal heart sounds, S1 normal and S2 normal. Heart sounds not distant. No murmur heard. No friction rub.  Pulmonary:     Effort: Pulmonary effort is normal. No respiratory distress.     Breath sounds: Examination of the right-lower field reveals rales. Examination of the left-lower field reveals rales. Rales present. No decreased breath sounds, wheezing or rhonchi.     Comments: Faint crackles noted along the bilateral bases Chest:     Chest wall: No tenderness.  Abdominal:     General: There is no distension.     Palpations: Abdomen is  soft.     Tenderness: There is no abdominal tenderness.  Musculoskeletal:        General: No edema.     Cervical back: Normal range of motion.  Skin:    General: Skin is warm and dry.     Nails: There is no clubbing or cyanosis.  Neurological:     Mental Status: She is alert and oriented to person, place, and time.  Psychiatric:        Mood and Affect: Mood and affect normal.        Speech: Speech normal.        Behavior: Behavior normal.        Thought Content: Thought content normal.        Judgment: Judgment normal.     Wt Readings from Last 3 Encounters:  10/28/20 241 lb (109.3 kg)  10/17/20 236 lb 8 oz (107.3 kg)  10/05/20 239 lb (108.4 kg)     ASSESSMENT & PLAN:   1. Acute on chronic HFrEF secondary to NICM with underlying LBBB: She does have faint bibasilar crackles on exam today.  She has NYHA class III symptoms she will titrate furosemide to 40 mg daily for the next 3 days followed by resumption of 20 mg daily thereafter.  Unfortunately, she did not tolerate Entresto secondary to cough and discontinued this medication.  In its place, we will place her back on losartan 12.5 mg daily, which she previously tolerated without issues.  We will look into possible patient assistance paperwork regarding [name] as once she finishes her current supply she  will no longer be able to afford this medication.  Otherwise, she will continue Toprol-XL and spironolactone at current doses.  Recent BMP showed stable renal function on 10/27/2020.  CHF education was discussed in detail.  We will proceed with a follow-up limited echo in early 12/2020 on maximally tolerated GDMT.  She follows up with EP after this echo for further consideration of possible CRT given her persisting cardiomyopathy and LBBB despite maximally tolerated GDMT.  2. Nonobstructive CAD: No symptoms concerning for angina.  Prior noted moderate RCA disease with normal iFR in 08/2019 with subsequent coronary CTA in 06/2020 showing  nonobstructive disease.  Continue aggressive risk factor modification and medical therapy including aspirin, atorvastatin, Toprol, and Imdur.  No indication for further ischemic testing at this time.  3. PVCs: Quiescent.  Continue Toprol-XL.  4. HLD: LDL of 10.  She remains on atorvastatin with history of fatty liver disease.  5. COPD/restrictive lung disease: Stable and followed by pulmonology.  6. Sleep apnea: She has history of sleep apnea and was previously on CPAP though lost her insurance and therefore no longer has her CPAP machine.  I do suspect her obesity, OSA, and possible OHS are likely contributing to her overall presentation.  Follow-up with pulmonology as directed.  7. Morbid obesity with deconditioning: Contributing to her presentation as outlined above.  She was previously intolerant to cardiopulmonary rehab.  Weight loss is recommended.  Disposition: F/u with Dr. Rockey Situ or an APP in 3 months, and EP as directed.   Medication Adjustments/Labs and Tests Ordered: Current medicines are reviewed at length with the patient today.  Concerns regarding medicines are outlined above. Medication changes, Labs and Tests ordered today are summarized above and listed in the Patient Instructions accessible in Encounters.   Signed, Christell Faith, PA-C 10/28/2020 10:54 AM     [facility] [address] [place] [address] [place], [place] [address] [phone]

## 2020-10-27 ENCOUNTER — Other Ambulatory Visit: Payer: Self-pay

## 2020-10-27 ENCOUNTER — Other Ambulatory Visit
Admission: RE | Admit: 2020-10-27 | Discharge: 2020-10-27 | Disposition: A | Discharge status: Home / Self Care | Payer: Medicare Other | Source: Ambulatory Visit | Attending: Physician Assistant | Admitting: Physician Assistant

## 2020-10-27 DIAGNOSIS — I5022 Chronic systolic (congestive) heart failure: Secondary | ICD-10-CM | POA: Diagnosis not present

## 2020-10-27 LAB — BASIC METABOLIC PANEL
Anion gap: 11 (ref 5–15)
BUN: 23 mg/dL (ref 8–23)
CO2: 26 mmol/L (ref 22–32)
Calcium: 8.8 mg/dL — ABNORMAL LOW (ref 8.9–10.3)
Chloride: 106 mmol/L (ref 98–111)
Creatinine, Ser: 0.78 mg/dL (ref 0.44–1.00)
GFR, Estimated: >60 mL/min (ref >60)
Glucose, Bld: 173 mg/dL — ABNORMAL HIGH (ref 70–99)
Potassium: 4.4 mmol/L (ref 3.5–5.1)
Sodium: 143 mmol/L (ref 135–145)

## 2020-10-28 ENCOUNTER — Encounter: Payer: Self-pay | Admitting: Physician Assistant

## 2020-10-28 ENCOUNTER — Ambulatory Visit (INDEPENDENT_AMBULATORY_CARE_PROVIDER_SITE_OTHER): Payer: Medicare Other | Admitting: Physician Assistant

## 2020-10-28 VITALS — BP 120/80 | HR 88 | Ht 63.0 in | Wt 241.0 lb

## 2020-10-28 DIAGNOSIS — I2510 Atherosclerotic heart disease of native coronary artery without angina pectoris: Secondary | ICD-10-CM

## 2020-10-28 DIAGNOSIS — I428 Other cardiomyopathies: Secondary | ICD-10-CM | POA: Diagnosis not present

## 2020-10-28 DIAGNOSIS — G4733 Obstructive sleep apnea (adult) (pediatric): Secondary | ICD-10-CM

## 2020-10-28 DIAGNOSIS — E785 Hyperlipidemia, unspecified: Secondary | ICD-10-CM | POA: Diagnosis not present

## 2020-10-28 DIAGNOSIS — I5043 Acute on chronic combined systolic (congestive) and diastolic (congestive) heart failure: Secondary | ICD-10-CM

## 2020-10-28 DIAGNOSIS — I447 Left bundle-branch block, unspecified: Secondary | ICD-10-CM | POA: Diagnosis not present

## 2020-10-28 DIAGNOSIS — I493 Ventricular premature depolarization: Secondary | ICD-10-CM | POA: Diagnosis not present

## 2020-10-28 DIAGNOSIS — J984 Other disorders of lung: Secondary | ICD-10-CM | POA: Diagnosis not present

## 2020-10-28 NOTE — Patient Instructions (Addendum)
Medication Instructions:  Your physician has recommended you make the following change in your medication:  1. INCREASE Furosemide 40 mg for 3 days then DECREASE Furosemide back to 20 mg once daily 2. START Losartan 25 mg take 1/2 tablet (12.5 mg) once daily  *If you need a refill on your cardiac medications before your next appointment, please call your pharmacy*   Lab Work: none If you have labs (blood work) drawn today and your tests are completely normal, you will receive your results only by: Marland Kitchen MyChart Message (if you have MyChart) OR . A paper copy in the mail If you have any lab test that is abnormal or we need to change your treatment, we will call you to review the results.   Testing/Procedures: Limited echo to be done prior to appointment with Dr. Quentin Ore in March 2022 Your physician has requested that you have an echocardiogram. Echocardiography is a painless test that uses sound waves to create images of your heart. It provides your doctor with information about the size and shape of your heart and how well your heart's chambers and valves are working. This procedure takes approximately one hour. There are no restrictions for this procedure.     Follow-Up: At [facility], you and your health needs are our priority.  As part of our continuing mission to provide you with exceptional heart care, we have created designated Provider Care Teams.  These Care Teams include your primary Cardiologist (physician) and Advanced Practice Providers (APPs -  Physician Assistants and Nurse Practitioners) who all work together to provide you with the care you need, when you need it.   Your next appointment:   Keep follow up appointment with Dr. Quentin Ore on March 16 th at 09:00 AM  Schedule follow up with provider in April 2022  The format for your next appointment:   In Person  Provider:   You may see Ida Rogue, MD or one of the following Advanced Practice Providers on your  designated Care Team:    Murray Hodgkins, NP  Christell Faith, PA-C  Marrianne Mood, PA-C  Cadence Navassa, [place]  Laurann Montana, NP

## 2020-11-09 ENCOUNTER — Inpatient Hospital Stay: Payer: Medicare Other | Admitting: Oncology

## 2020-11-09 ENCOUNTER — Ambulatory Visit: Admission: RE | Admit: 2020-11-09 | Payer: Medicare Other | Source: Ambulatory Visit

## 2020-11-09 ENCOUNTER — Encounter: Payer: Self-pay | Admitting: *Deleted

## 2020-11-10 ENCOUNTER — Other Ambulatory Visit: Payer: Self-pay | Admitting: Nurse Practitioner

## 2020-11-15 ENCOUNTER — Telehealth: Payer: Self-pay | Admitting: *Deleted

## 2020-11-15 NOTE — Telephone Encounter (Signed)
Received referral for low dose lung cancer screening CT scan. Message left at phone number listed in EMR for patient to call me back to facilitate scheduling scan.  

## 2020-11-15 NOTE — Telephone Encounter (Signed)
Called back and will be rescheduled to next week

## 2020-11-17 ENCOUNTER — Other Ambulatory Visit: Payer: Self-pay | Admitting: Nurse Practitioner

## 2020-11-17 DIAGNOSIS — I25118 Atherosclerotic heart disease of native coronary artery with other forms of angina pectoris: Secondary | ICD-10-CM

## 2020-11-23 ENCOUNTER — Other Ambulatory Visit: Payer: Self-pay

## 2020-11-23 ENCOUNTER — Telehealth: Payer: Self-pay | Admitting: General Practice

## 2020-11-23 ENCOUNTER — Inpatient Hospital Stay: Payer: Medicare Other | Attending: Oncology | Admitting: Oncology

## 2020-11-23 ENCOUNTER — Ambulatory Visit
Admission: RE | Admit: 2020-11-23 | Discharge: 2020-11-23 | Disposition: A | Discharge status: Home / Self Care | Payer: Medicare Other | Source: Ambulatory Visit | Attending: Oncology | Admitting: Oncology

## 2020-11-23 ENCOUNTER — Telehealth: Payer: Medicare Other

## 2020-11-23 DIAGNOSIS — Z87891 Personal history of nicotine dependence: Secondary | ICD-10-CM | POA: Diagnosis not present

## 2020-11-23 DIAGNOSIS — Z122 Encounter for screening for malignant neoplasm of respiratory organs: Secondary | ICD-10-CM | POA: Diagnosis not present

## 2020-11-23 NOTE — Progress Notes (Signed)
Virtual Visit via Video Note  I connected with Kim Buchanan on 11/23/20 at 10:15 AM EST by a video enabled telemedicine application and verified that I am speaking with the correct person using two identifiers.  Location: Patient: [facility] Provider: Clinic    I discussed the limitations of evaluation and management by telemedicine and the availability of in person appointments. The patient expressed understanding and agreed to proceed.  I discussed the assessment and treatment plan with the patient. The patient was provided an opportunity to ask questions and all were answered. The patient agreed with the plan and demonstrated an understanding of the instructions.   The patient was advised to call back or seek an in-person evaluation if the symptoms worsen or if the condition fails to improve as anticipated.   In accordance with CMS guidelines, patient has met eligibility criteria including age, absence of signs or symptoms of lung cancer.  Social History   Tobacco Use  . Smoking status: Former Smoker    Packs/day: 1.50    Years: 50.00    Pack years: 75.00    Types: Cigarettes    Quit date: 11/29/2018    Years since quitting: 1.9  . Smokeless tobacco: Never Used  . Tobacco comment: started at age 45  Vaping Use  . Vaping Use: Never used  Substance Use Topics  . Alcohol use: No    Alcohol/week: 0.0 standard drinks  . Drug use: No      A shared decision-making session was conducted prior to the performance of CT scan. This includes one or more decision aids, includes benefits and harms of screening, follow-up diagnostic testing, over-diagnosis, false positive rate, and total radiation exposure.   Counseling on the importance of adherence to annual lung cancer LDCT screening, impact of co-morbidities, and ability or willingness to undergo diagnosis and treatment is imperative for compliance of the program.   Counseling on the importance of continued smoking cessation for former  smokers; the importance of smoking cessation for current smokers, and information about tobacco cessation interventions have been given to patient including [place] and [employer] quit [place] programs.   Written order for lung cancer screening with LDCT has been given to the patient and any and all questions have been answered to the best of my abilities.    Yearly follow up will be coordinated by Kim Buchanan, Thoracic Navigator.  I provided 15 minutes of face-to-face video visit time during this encounter, and > 50% was spent counseling as documented under my assessment & plan.   Jacquelin Hawking, NP

## 2020-11-23 NOTE — Telephone Encounter (Cosign Needed)
°  Chronic Care Management   Outreach Note  11/23/2020 Name: Kim Buchanan MRN: [id number] DOB: Aug 11, 1951  Referred by: Venita Lick, NP Reason for referral : Appointment (RNCM: 3rd attempt for initial outreach/Chronic Disease Management and Care Coordination Needs )   Third unsuccessful telephone outreach was attempted today. The patient was referred to the case management team for assistance with care management and care coordination. The patient's primary care provider has been notified of our unsuccessful attempts to make or maintain contact with the patient. The care management team is pleased to engage with this patient at any time in the future should he/she be interested in assistance from the care management team.   Follow Up Plan: A HIPAA compliant phone message was left for the patient providing contact information and requesting a return call. No further outreach at this time.   Noreene Larsson RN, MSN, [facility] Mobile: [phone]

## 2020-11-25 ENCOUNTER — Telehealth: Payer: Self-pay | Admitting: *Deleted

## 2020-11-25 NOTE — Telephone Encounter (Signed)
Noted  

## 2020-11-25 NOTE — Telephone Encounter (Signed)
Notified patient of LDCT lung cancer screening program results with recommendation for 12 month follow up imaging. Also notified of incidental findings noted below and is encouraged to discuss further with PCP who will receive a copy of this note and/or the CT report. Patient verbalizes understanding.   IMPRESSION: 1. Lung-RADS 2, benign appearance or behavior. Continue annual screening with low-dose chest CT without contrast in 12 months. 2. Hepatic steatosis. 3.  Aortic Atherosclerosis (ICD10-I70.0).

## 2020-12-02 ENCOUNTER — Ambulatory Visit (INDEPENDENT_AMBULATORY_CARE_PROVIDER_SITE_OTHER): Payer: Medicare Other

## 2020-12-02 VITALS — Ht 63.0 in | Wt 245.0 lb

## 2020-12-02 DIAGNOSIS — Z0000 Encounter for general adult medical examination without abnormal findings: Secondary | ICD-10-CM | POA: Diagnosis not present

## 2020-12-02 NOTE — Progress Notes (Signed)
I connected with Kim Buchanan today by telephone and verified that I am speaking with the correct person using two identifiers. Location patient: home Location provider: work Persons participating in the virtual visit: [name], [name].   I discussed the limitations, risks, security and privacy concerns of performing an evaluation and management service by telephone and the availability of in person appointments. I also discussed with the patient that there may be a patient responsible charge related to this service. The patient expressed understanding and verbally consented to this telephonic visit.    Interactive audio and video telecommunications were attempted between this provider and patient, however failed, due to patient having technical difficulties OR patient did not have access to video capability.  We continued and completed visit with audio only.     Vital signs may be patient reported or missing.  Subjective:   Kim Buchanan is a 70 y.o. female who presents for Medicare Annual (Subsequent) preventive examination.  Review of Systems     Cardiac Risk Factors include: advanced age (>77mn, >>53women);dyslipidemia;hypertension;obesity (BMI >30kg/m2);sedentary lifestyle     Objective:    Today's Vitals   12/02/20 0856  Weight: 245 lb (111.1 kg)  Height: '5\' 3"'  (1.6 m)   Body mass index is 43.4 kg/m.  Advanced Directives 12/02/2020 11/30/2019 09/11/2019 08/11/2019 10/03/2018 01/04/2018 11/14/2017  Does Patient Have a Medical Advance Directive? Yes Yes Yes Yes Yes No Yes  Type of AParamedicof [place]Living will H[place]umptionLiving will - [place]ibertyLiving will H[place]Living will - Living will  Does patient want to make changes to medical advance directive? - - No - Patient declined - - - No - Patient declined  Copy of [place]id[place]in Chart? No - copy requested  No - copy requested - No - copy requested No - copy requested - -  Would patient like information on creating a medical advance directive? - - - - - No - Patient declined -    Current Medications (verified) Outpatient Encounter Medications as of 12/02/2020  Medication Sig  . aspirin 81 MG tablet Take 81 mg by mouth daily.   .Marland Kitchenatorvastatin (LIPITOR) 10 MG tablet Take 1 tablet (10 mg total) by mouth at bedtime.  . blood glucose meter kit and supplies Dispense based on patient and insurance preference. Check fasting blood sugar once daily. (FOR ICD E11.69).  .Marland Kitchengabapentin (NEURONTIN) 300 MG capsule Take 1 capsule (300 mg total) by mouth 3 (three) times daily.  . isosorbide mononitrate (IMDUR) 30 MG 24 hr tablet TAKE 1 TABLET BY MOUTH  DAILY  . Lancets (ONETOUCH DELICA PLUS [id number] MISC daily.   .Marland Kitchenlosartan (COZAAR) 25 MG tablet Take 0.5 tablets (12.5 mg total) by mouth daily.  . metFORMIN (GLUCOPHAGE) 1000 MG tablet Take 1 tablet (1,000 mg total) by mouth 2 (two) times daily with a meal.  . metoprolol succinate (TOPROL-XL) 25 MG 24 hr tablet TAKE 1 AND 1/2 TABLETS BY  MOUTH DAILY  . ONETOUCH ULTRA test strip 1 each daily.  . pantoprazole (PROTONIX) 40 MG tablet Take 40 mg by mouth daily.   . polyethylene glycol powder (GLYCOLAX/MIRALAX) 17 GM/SCOOP powder Take 1-2 doses daily as needed for constipation/to prevent constipation  . QUEtiapine (SEROQUEL) 100 MG tablet TAKE 1 TABLET BY MOUTH AT  BEDTIME  . dapagliflozin propanediol (FARXIGA) 10 MG TABS tablet Take 1 tablet (10 mg total) by mouth daily before breakfast. (Patient not  taking: Reported on 12/02/2020)  . furosemide (LASIX) 20 MG tablet Take 1 tablet (20 mg total) by mouth daily.  Marland Kitchen spironolactone (ALDACTONE) 25 MG tablet Take 0.5 tablets (12.5 mg total) by mouth daily.   No facility-administered encounter medications on file as of 12/02/2020.    Allergies (verified) Lisinopril   History: Past Medical History:  Diagnosis Date  .  Anxiety   . Arthritis    lower back  . COPD (chronic obstructive pulmonary disease) ([place])   . Depression   . Diabetes mellitus without complication ([place])   . GERD (gastroesophageal reflux disease)   . Hyperlipidemia   . Morbid obesity ([place])   . NICM (nonischemic cardiomyopathy) ([place])    a. 03/2019 Echo: Nl LVEF; b. 09/04/2019 Echo: EF 25-30%, Gr1 DD, glob HK, nl RV fxn; c. 09/11/2019 Cath: nonobs LAD/RCA dzs. EF 50-55%. RA 10, RV 29/8(11), PA 27/16(20), PCWP 12. CO/CI 5.04/2.51.  Marland Kitchen Nonobstructive CAD (coronary artery disease)    a. 03/2019 [facility] [place]): EF 63%, ant ischemia; b. 08/2019 Cath: LM nl, LAD 61m RCA 70p (iFR nl @ 0.97), 50p. EF 50-55%-->Med Rx.  . Osteoporosis   . Panic attack   . PTSD (post-traumatic stress disorder)   . Rectal fistula   . Sleep apnea    a. Prev wore CPAP but lost insurance and hasn't been using since (now has insurance).  . Wears dentures    full upper   Past Surgical History:  Procedure Laterality Date  . ABDOMINAL HYSTERECTOMY    . CARDIAC CATHETERIZATION    . COLONOSCOPY  2014  . COLONOSCOPY WITH PROPOFOL N/A 10/01/2016   Procedure: COLONOSCOPY WITH PROPOFOL;  Surgeon: DLucilla Lame MD;  [place] [facility]  Service: Endoscopy;  Laterality: N/A;  . CORONARY PRESSURE WIRE/FFR WITH 3D MAPPING N/A 09/11/2019   Procedure: Coronary Pressure Wire/FFR w/3D Mapping;  Surgeon: ENelva Bush MD;  [facility];  Service: Cardiovascular;  Laterality: N/A;  . CYSTOCELE REPAIR N/A 12/17/2016   Procedure: ANTERIOR REPAIR (CYSTOCELE);  Surgeon: MBrayton Mars MD;  Location: [facility];  Service: Gynecology;  Laterality: N/A;  . ESOPHAGOGASTRODUODENOSCOPY (EGD) WITH PROPOFOL N/A 08/11/2019   Procedure: ESOPHAGOGASTRODUODENOSCOPY (EGD) WITH PROPOFOL;  Surgeon: WLucilla Lame MD;  Location: [facility];  Service: Endoscopy;  Laterality: N/A;  . POLYPECTOMY  10/01/2016   Procedure: POLYPECTOMY;  Surgeon: DLucilla Lame MD;  [place]  [facility]  Service: Endoscopy;;  . RECTAL SURGERY  06/28/2015   Rectal prolapse, laparoscopic rectopexy the coldCharmont, MD; [facility]  . RIGHT/LEFT HEART CATH AND CORONARY ANGIOGRAPHY Bilateral 09/11/2019   Procedure: RIGHT/LEFT HEART CATH AND CORONARY ANGIOGRAPHY;  Surgeon: GMinna Merritts MD;  [facility];  Service: Cardiovascular;  Laterality: Bilateral;  . TUBAL LIGATION    . VAGINAL HYSTERECTOMY Bilateral 12/17/2016   Procedure: HYSTERECTOMY VAGINAL WITH BILATERAL SALPINGO OOPHERECTOMY;  Surgeon: MBrayton Mars MD;  Location: [facility];  Service: Gynecology;  Laterality: Bilateral;   Family History  Problem Relation Age of Onset  . Diabetes Mother   . Diabetes Sister   . Cancer Sister   . Diabetes Brother   . Lymphoma Brother   . Heart disease Father   . Hypertension Father   . Diabetes Sister   . Diabetes Brother   . Lymphoma Brother   . Heart disease Brother   . Healthy Sister   . Breast cancer Neg Hx   . Ovarian cancer Neg Hx   . Colon cancer Neg Hx  Social History   Socioeconomic History  . Marital status: Married    Spouse name: Paediatric nurse  . Number of children: 1  . Years of education: Not on file  . Highest education level: 9th grade  Occupational History    Employer: DISABLED    Comment: for panic attacks  Tobacco Use  . Smoking status: Former Smoker    Packs/day: 1.50    Years: 50.00    Pack years: 75.00    Types: Cigarettes    Quit date: 11/29/2018    Years since quitting: 2.0  . Smokeless tobacco: Never Used  . Tobacco comment: started at age 74  Vaping Use  . Vaping Use: Never used  Substance and Sexual Activity  . Alcohol use: No    Alcohol/week: 0.0 standard drinks  . Drug use: No  . Sexual activity: Not Currently  Other Topics Concern  . Not on file  Social History Narrative   Pt had one son, who died when was 59 YO in a MVA.   Social Determinants of Health   Financial Resource Strain: Low Risk   .  Difficulty of Paying Living Expenses: Not hard at all  Food Insecurity: No Food Insecurity  . Worried About Charity fundraiser in the Last Year: Never true  . Ran Out of Food in the Last Year: Never true  Transportation Needs: No Transportation Needs  . Lack of Transportation (Medical): No  . Lack of Transportation (Non-Medical): No  Physical Activity: Inactive  . Days of Exercise per Week: 0 days  . Minutes of Exercise per Session: 0 min  Stress: No Stress Concern Present  . Feeling of Stress : Not at all  Social Connections: Not on file    Tobacco Counseling Counseling given: Not Answered Comment: started at age 68   Clinical Intake:  Pre-visit preparation completed: Yes  Pain : No/denies pain     Nutritional Status: BMI > 30  Obese Nutritional Risks: None Diabetes: Yes  How often do you need to have someone help you when you read instructions, pamphlets, or other written materials from your doctor or pharmacy?: 1 - Never What is the last grade level you completed in school?: 9th grade  Diabetic? Yes Nutrition Risk Assessment:  Has the patient had any N/V/D within the last 2 months?  No  Does the patient have any non-healing wounds?  No  Has the patient had any unintentional weight loss or weight gain?  No   Diabetes:  Is the patient diabetic?  Yes  If diabetic, was a CBG obtained today?  No  Did the patient bring in their glucometer from home?  No  How often do you monitor your CBG's? Does not check.   Financial Strains and Diabetes Management:  Are you having any financial strains with the device, your supplies or your medication? No .  Does the patient want to be seen by Chronic Care Management for management of their diabetes?  No  Would the patient like to be referred to a Nutritionist or for Diabetic Management?  No   Diabetic Exams:  Diabetic Eye Exam: Overdue for diabetic eye exam. Pt has been advised about the importance in completing this exam.  Patient advised to call and schedule an eye exam. Diabetic Foot Exam: Completed 03/02/2020   Interpreter Needed?: No  Information entered by :: NAllen LPN   Activities of Daily Living In your present state of health, do you have any difficulty performing the following activities: 12/02/2020  03/11/2020  Hearing? N N  Vision? N N  Difficulty concentrating or making decisions? N Y  Walking or climbing stairs? Y Y  Dressing or bathing? N Y  Doing errands, shopping? N Y  Conservation officer, nature and eating ? N -  Using the Toilet? N -  In the past six months, have you accidently leaked urine? N -  Do you have problems with loss of bowel control? N -  Managing your Medications? N -  Housekeeping or managing your Housekeeping? N -  Some recent data might be hidden    Patient Care Team: Venita Lick, NP as PCP - General (Nurse Practitioner) [name], MD as PCP - Cardiology (Cardiology) [name], MD as PCP - Electrophysiology (Cardiology) [name], OD as Consulting Physician (Optometry) [name], MD as Consulting Physician (Pulmonary Disease)  Indicate any recent Medical Services you may have received from other than [facility] providers in the past year (date may be approximate).     Assessment:   This is a routine wellness examination for Zylie.  Hearing/Vision screen  Hearing Screening   '125Hz'  '250Hz'  '500Hz'  '1000Hz'  '2000Hz'  '3000Hz'  '4000Hz'  '6000Hz'  '8000Hz'   Right ear:           Left ear:           Vision Screening Comments: Regular eye exams, Dr. Ellin Mayhew  Dietary issues and exercise activities discussed: Current Exercise Habits: The patient does not participate in regular exercise at present  Goals    .  Patient Stated      12/02/2020, bring down A1C    .  PharmD "I need help affording this inhaler" (pt-stated)      CARE PLAN ENTRY (see longitudinal plan of care for additional care plan information)  Current Barriers:  . Polypharmacy; complex patient  with multiple comorbidities including COPD, CAD, HTN, HLD . Financial concerns - reports that when she called [facility] today to check on the cost of the Trelegy, it was too expensive . Most recent eGFR: 64 mL/min o COPD: reports that no inhalers have ever seemed to help. Normal PFTs. Multiple no-shows o CAD: follows w/ Dr. Rockey Situ, multiple no-shows. ASA 81 mg daily, atorvastatin 20 mg daily, imdur 30 mg daily o T2DM; last A1c 7.2%; metformin 500 mg BID o Peripheral neuropathy: gabapentin 300 mg TID   Pharmacist Clinical Goal(s):  Marland Kitchen Over the next 90 days, patient will work with PharmD and provider towards optimized medication management  Interventions: . Inter-disciplinary care team collaboration (see longitudinal plan of care) . Contacted [facility]. Copay for 90 day was $240, 30 day $142. Appears patient has not mer her deductible yet, and reports the inability to be able to pay that. Patient notes that inhalers "never help" anyway. Discussed patient assistance program availability and income requirements. Patient is going to talk to Dr. Patsey Berthold at next appt to determine if we need to pursue inhaler assistance.  . Will collaborate w/ RN CM for follow up, support of care.   Patient Self Care Activities:  . Patient will take medications as prescribed  Initial goal documentation     .  Weight (lb) < 150 lb (68 kg)      Recommend cutting out bad carbohydrates in daily diet. Pt to focus on healthy carbs and spreading out servings in diet.       Depression Screen PHQ 2/9 Scores 12/02/2020 10/17/2020 06/15/2020 03/11/2020 11/30/2019 08/06/2019 06/05/2019  PHQ - 2 Score 0 0 0 '1 3 6 ' 6  PHQ- 9 Score '6 7 5 14 12 18 20    ' Fall Risk Fall Risk  12/02/2020 03/11/2020 11/30/2019 06/05/2019 10/03/2018  Falls in the past year? 0 0 0 0 0  Number falls in past yr: - 0 0 - -  Injury with Fall? - 0 0 - -  Comment - - - - -  Risk for fall due to : Medication side effect - - - -  Follow up Falls evaluation  completed;Education provided;Falls prevention discussed - - - -    FALL RISK PREVENTION PERTAINING TO THE HOME:  Any stairs in or around the home? Yes  If so, are there any without handrails? No  Home free of loose throw rugs in walkways, pet beds, electrical cords, etc? Yes  Adequate lighting in your home to reduce risk of falls? Yes   ASSISTIVE DEVICES UTILIZED TO PREVENT FALLS:  Life alert? No  Use of a cane, walker or w/c? Yes  Grab bars in the bathroom? No  Shower chair or bench in shower? Yes  Elevated toilet seat or a handicapped toilet? Yes   TIMED UP AND GO:  Was the test performed? No .      Cognitive Function:     6CIT Screen 12/02/2020 10/03/2018 05/13/2017  What Year? 0 points 0 points 0 points  What month? 0 points 0 points 0 points  What time? 0 points 0 points 0 points  Count back from 20 0 points 0 points 0 points  Months in reverse 0 points 0 points 0 points  Repeat phrase 0 points 0 points 2 points  Total Score 0 0 2    Immunizations Immunization History  Administered Date(s) Administered  . Influenza, High Dose Seasonal PF 06/16/2019, 07/04/2020  . Influenza, Seasonal, Injecte, Preservative Fre 08/18/2008, 07/14/2014  . Influenza,inj,Quad PF,6+ Mos 09/25/2013, 08/05/2015, 08/02/2016, 07/24/2017  . Moderna Sars-Covid-2 Vaccination 12/02/2019, 12/30/2019, 10/29/2020  . Pneumococcal Conjugate-13 03/13/2018  . Pneumococcal Polysaccharide-23 08/06/2019  . Tdap 08/08/2009, 05/13/2014    TDAP status: Up to date  Flu Vaccine status: Up to date  Pneumococcal vaccine status: Up to date  Covid-19 vaccine status: Completed vaccines  Qualifies for Shingles Vaccine? Yes   Zostavax completed No   Shingrix Completed?: No.    Education has been provided regarding the importance of this vaccine. Patient has been advised to call insurance company to determine out of pocket expense if they have not yet received this vaccine. Advised may also receive vaccine at  local pharmacy or Health Dept. Verbalized acceptance and understanding.  Screening Tests Health Maintenance  Topic Date Due  . OPHTHALMOLOGY EXAM  Never done  . FOOT EXAM  03/02/2021  . HEMOGLOBIN A1C  04/17/2021  . COLONOSCOPY (Pts 45-6yr Insurance coverage will need to be confirmed)  10/01/2021  . MAMMOGRAM  01/17/2022  . TETANUS/TDAP  05/13/2024  . INFLUENZA VACCINE  Completed  . DEXA SCAN  Completed  . COVID-19 Vaccine  Completed  . Hepatitis C Screening  Completed  . PNA vac Low Risk Adult  Completed    Health Maintenance  Health Maintenance Due  Topic Date Due  . OPHTHALMOLOGY EXAM  Never done    Colorectal cancer screening: Type of screening: Colonoscopy. Completed 10/01/2016. Repeat every 5 years  Mammogram status: Completed 01/18/2020. Repeat every year  Bone Density status: Completed 03/02/2020.  Lung Cancer Screening: (Low Dose CT Chest recommended if Age 226-80years, 30 pack-year currently smoking OR have quit w/in 15years.) does qualify.   Lung Cancer  Screening Referral: completed 11/23/2020  Additional Screening:  Hepatitis C Screening: does qualify; Completed 07/22/2019  Vision Screening: Recommended annual ophthalmology exams for early detection of glaucoma and other disorders of the eye. Is the patient up to date with their annual eye exam?  No  Who is the provider or what is the name of the office in which the patient attends annual eye exams? Dr. Ellin Mayhew If pt is not established with a provider, would they like to be referred to a provider to establish care? No .   Dental Screening: Recommended annual dental exams for proper oral hygiene  Community Resource Referral / Chronic Care Management: CRR required this visit?  No   CCM required this visit?  No      Plan:     I have personally reviewed and noted the following in the patient's chart:   . Medical and social history . Use of alcohol, tobacco or illicit drugs  . Current medications and  supplements . Functional ability and status . Nutritional status . Physical activity . Advanced directives . List of other physicians . Hospitalizations, surgeries, and ER visits in previous 12 months . Vitals . Screenings to include cognitive, depression, and falls . Referrals and appointments  In addition, I have reviewed and discussed with patient certain preventive protocols, quality metrics, and best practice recommendations. A written personalized care plan for preventive services as well as general preventive health recommendations were provided to patient.     Kellie Simmering, LPN   4/47/[id number]   Nurse Notes:

## 2020-12-02 NOTE — Patient Instructions (Signed)
[name] , Thank you for taking time to come for your Medicare Wellness Visit. I appreciate your ongoing commitment to your health goals. Please review the following plan we discussed and let me know if I can assist you in the future.   Screening recommendations/referrals: Colonoscopy: completed 10/01/2016 Mammogram: completed 01/18/2020, due 01/17/2021 Bone Density: completed 03/02/2020 Recommended yearly ophthalmology/optometry visit for glaucoma screening and checkup Recommended yearly dental visit for hygiene and checkup  Vaccinations: Influenza vaccine: completed 07/04/2020, due 05/22/2021 Pneumococcal vaccine: completed 08/06/2019 Tdap vaccine: completed 05/13/2014, due 05/13/2024 Shingles vaccine: discussed   Covid-19: 10/29/2020, 12/30/2019, 12/02/2019  Advanced directives: Please bring a copy of your POA (Power of Attorney) and/or Living Will to your next appointment.   Conditions/risks identified: none  Next appointment: Follow up in one year for your annual wellness visit    Preventive Care 65 Years and Older, Female Preventive care refers to lifestyle choices and visits with your health care provider that can promote health and wellness. What does preventive care include?  A yearly physical exam. This is also called an annual well check.  Dental exams once or twice a year.  Routine eye exams. Ask your health care provider how often you should have your eyes checked.  Personal lifestyle choices, including:  Daily care of your teeth and gums.  Regular physical activity.  Eating a healthy diet.  Avoiding tobacco and drug use.  Limiting alcohol use.  Practicing safe sex.  Taking low-dose aspirin every day.  Taking vitamin and mineral supplements as recommended by your health care provider. What happens during an annual well check? The services and screenings done by your health care provider during your annual well check will depend on your age, overall health,  lifestyle risk factors, and family history of disease. Counseling  Your health care provider may ask you questions about your:  Alcohol use.  Tobacco use.  Drug use.  Emotional well-being.  Home and relationship well-being.  Sexual activity.  Eating habits.  History of falls.  Memory and ability to understand (cognition).  Work and work Statistician.  Reproductive health. Screening  You may have the following tests or measurements:  Height, weight, and BMI.  Blood pressure.  Lipid and cholesterol levels. These may be checked every 5 years, or more frequently if you are over 45 years old.  Skin check.  Lung cancer screening. You may have this screening every year starting at age 40 if you have a 30-pack-year history of smoking and currently smoke or have quit within the past 15 years.  Fecal occult blood test (FOBT) of the stool. You may have this test every year starting at age 16.  Flexible sigmoidoscopy or colonoscopy. You may have a sigmoidoscopy every 5 years or a colonoscopy every 10 years starting at age 46.  Hepatitis C blood test.  Hepatitis B blood test.  Sexually transmitted disease (STD) testing.  Diabetes screening. This is done by checking your blood sugar (glucose) after you have not eaten for a while (fasting). You may have this done every 1-3 years.  Bone density scan. This is done to screen for osteoporosis. You may have this done starting at age 29.  Mammogram. This may be done every 1-2 years. Talk to your health care provider about how often you should have regular mammograms. Talk with your health care provider about your test results, treatment options, and if necessary, the need for more tests. Vaccines  Your health care provider may recommend certain vaccines, such as:  Influenza vaccine. This is recommended every year.  Tetanus, diphtheria, and acellular pertussis (Tdap, Td) vaccine. You may need a Td booster every 10 years.  Zoster  vaccine. You may need this after age 61.  Pneumococcal 13-valent conjugate (PCV13) vaccine. One dose is recommended after age 52.  Pneumococcal polysaccharide (PPSV23) vaccine. One dose is recommended after age 66. Talk to your health care provider about which screenings and vaccines you need and how often you need them. This information is not intended to replace advice given to you by your health care provider. Make sure you discuss any questions you have with your health care provider. Document Released: 11/04/2015 Document Revised: 06/27/2016 Document Reviewed: 08/09/2015 Elsevier Interactive Patient Education  2017 [employer] Prevention in the Home Falls can cause injuries. They can happen to people of all ages. There are many things you can do to make your home safe and to help prevent falls. What can I do on the outside of my home?  Regularly fix the edges of walkways and driveways and fix any cracks.  Remove anything that might make you trip as you walk through a door, such as a raised step or threshold.  Trim any bushes or trees on the path to your home.  Use bright outdoor lighting.  Clear any walking paths of anything that might make someone trip, such as rocks or tools.  Regularly check to see if handrails are loose or broken. Make sure that both sides of any steps have handrails.  Any raised decks and porches should have guardrails on the edges.  Have any leaves, snow, or ice cleared regularly.  Use sand or salt on walking paths during winter.  Clean up any spills in your garage right away. This includes oil or grease spills. What can I do in the bathroom?  Use night lights.  Install grab bars by the toilet and in the tub and shower. Do not use towel bars as grab bars.  Use non-skid mats or decals in the tub or shower.  If you need to sit down in the shower, use a plastic, non-slip stool.  Keep the floor dry. Clean up any water that spills on the  floor as soon as it happens.  Remove soap buildup in the tub or shower regularly.  Attach bath mats securely with double-sided non-slip rug tape.  Do not have throw rugs and other things on the floor that can make you trip. What can I do in the bedroom?  Use night lights.  Make sure that you have a light by your bed that is easy to reach.  Do not use any sheets or blankets that are too big for your bed. They should not hang down onto the floor.  Have a firm chair that has side arms. You can use this for support while you get dressed.  Do not have throw rugs and other things on the floor that can make you trip. What can I do in the kitchen?  Clean up any spills right away.  Avoid walking on wet floors.  Keep items that you use a lot in easy-to-reach places.  If you need to reach something above you, use a strong step stool that has a grab bar.  Keep electrical cords out of the way.  Do not use floor polish or wax that makes floors slippery. If you must use wax, use non-skid floor wax.  Do not have throw rugs and other things on the floor that  can make you trip. What can I do with my stairs?  Do not leave any items on the stairs.  Make sure that there are handrails on both sides of the stairs and use them. Fix handrails that are broken or loose. Make sure that handrails are as long as the stairways.  Check any carpeting to make sure that it is firmly attached to the stairs. Fix any carpet that is loose or worn.  Avoid having throw rugs at the top or bottom of the stairs. If you do have throw rugs, attach them to the floor with carpet tape.  Make sure that you have a light switch at the top of the stairs and the bottom of the stairs. If you do not have them, ask someone to add them for you. What else can I do to help prevent falls?  Wear shoes that:  Do not have high heels.  Have rubber bottoms.  Are comfortable and fit you well.  Are closed at the toe. Do not wear  sandals.  If you use a stepladder:  Make sure that it is fully opened. Do not climb a closed stepladder.  Make sure that both sides of the stepladder are locked into place.  Ask someone to hold it for you, if possible.  Clearly mark and make sure that you can see:  Any grab bars or handrails.  First and last steps.  Where the edge of each step is.  Use tools that help you move around (mobility aids) if they are needed. These include:  Canes.  Walkers.  Scooters.  Crutches.  Turn on the lights when you go into a dark area. Replace any light bulbs as soon as they burn out.  Set up your furniture so you have a clear path. Avoid moving your furniture around.  If any of your floors are uneven, fix them.  If there are any pets around you, be aware of where they are.  Review your medicines with your doctor. Some medicines can make you feel dizzy. This can increase your chance of falling. Ask your doctor what other things that you can do to help prevent falls. This information is not intended to replace advice given to you by your health care provider. Make sure you discuss any questions you have with your health care provider. Document Released: 08/04/2009 Document Revised: 03/15/2016 Document Reviewed: 11/12/2014 Elsevier Interactive Patient Education  2017 [employer].

## 2020-12-09 ENCOUNTER — Other Ambulatory Visit: Payer: Self-pay | Admitting: Nurse Practitioner

## 2020-12-09 MED ORDER — PANTOPRAZOLE SODIUM 40 MG PO TBEC: 40.0000 mg | DELAYED_RELEASE_TABLET | Freq: Every day | ORAL | 4 refills | Status: DC

## 2020-12-09 MED ORDER — SPIRONOLACTONE 25 MG PO TABS: 12.5000 mg | ORAL_TABLET | Freq: Every day | ORAL | 5 refills | Status: DC

## 2020-12-09 NOTE — Telephone Encounter (Signed)
Patient last seen 10/17/20. Next appointment is 01/17/21

## 2020-12-09 NOTE — Telephone Encounter (Signed)
Notes to clinic: Patient requesting medication that were filled by different provider  Review for refill    Requested Prescriptions  Pending Prescriptions Disp Refills   spironolactone (ALDACTONE) 25 MG tablet 30 tablet 3    Sig: Take 0.5 tablets (12.5 mg total) by mouth daily.      Cardiovascular: Diuretics - Aldosterone Antagonist Passed - 12/09/2020  9:43 AM      Passed - Cr in normal range and within 360 days    Creat  Date Value Ref Range Status  01/01/2018 0.82 0.50 - 0.99 mg/dL Final    Comment:    For patients >82 years of age, the reference limit for Creatinine is approximately 13% higher for people identified as African-American. .    Creatinine, Ser  Date Value Ref Range Status  10/27/2020 0.78 0.44 - 1.00 mg/dL Final          Passed - K in normal range and within 360 days    Potassium  Date Value Ref Range Status  10/27/2020 4.4 3.5 - 5.1 mmol/L Final  02/24/2013 4.1 3.5 - 5.1 mmol/L Final          Passed - Na in normal range and within 360 days    Sodium  Date Value Ref Range Status  10/27/2020 143 135 - 145 mmol/L Final  10/17/2020 141 134 - 144 mmol/L Final  02/24/2013 143 136 - 145 mmol/L Final          Passed - Last BP in normal range    BP Readings from Last 1 Encounters:  10/28/20 120/80          Passed - Valid encounter within last 6 months    Recent Outpatient Visits           1 month ago Type 2 diabetes mellitus with peripheral neuropathy ([place])   [place], Jolene T, NP   5 months ago Type 2 diabetes mellitus with peripheral neuropathy ([place])   [place], [place] T, NP   7 months ago Insomnia, unspecified type   [facility], [place], [place]   9 months ago Chronic left-sided low back pain without sciatica   [facility] [place], [place]   9 months ago Type 2 diabetes mellitus with other specified complication, without long-term current use  of insulin [facility])   [facility], Dionne Bucy, MD       Future Appointments             In 3 weeks [name], MD [facility], [place]   In 1 month [place], [place] T, NP [employer], [facility]   In 1 month [name], Ryan M, PA-C [facility], [facility]   In 12 months  [place], [facility]               pantoprazole (PROTONIX) 40 MG tablet      Sig: Take 1 tablet (40 mg total) by mouth daily.      Gastroenterology: Proton Pump Inhibitors Passed - 12/09/2020  9:43 AM      Passed - Valid encounter within last 12 months    Recent Outpatient Visits           1 month ago Type 2 diabetes mellitus with peripheral neuropathy ([place])   [place], Jolene T, NP   5 months ago Type 2 diabetes mellitus with peripheral neuropathy ([place])   [facility] [place] T,  NP   7 months ago Insomnia, unspecified type   [place], [place], [place]   9 months ago Chronic left-sided low back pain without sciatica   [facility], [place]   9 months ago Type 2 diabetes mellitus with other specified complication, without long-term current use of insulin [facility])   [facility], Dionne Bucy, MD       Future Appointments             In 3 weeks [name], MD [facility], [place]   In 1 month [place], Barbaraann Faster, NP [employer], [place]   In 1 month [name], Areta Haber, PA-C [place], [facility]   In 12 months  [employer], [place]

## 2020-12-09 NOTE — Telephone Encounter (Signed)
Copied from [place] [id number]. Topic: Quick Communication - Rx Refill/Question >> Dec 09, 2020  9:28 AM Tessa Lerner A wrote: Medication: pantoprazole (PROTONIX) 40 MG spironolactone (ALDACTONE) 25 MG tablet   Has the patient contacted their pharmacy? Yes. Patient has spoken with pharmacy and received difficulty being assisted.   Preferred Pharmacy (with phone number or street name): [place], [address] [place], [address]  Phone:  [phone]  Agent: Please be advised that RX refills may take up to 3 business days. We ask that you follow-up with your pharmacy.

## 2020-12-20 ENCOUNTER — Ambulatory Visit (INDEPENDENT_AMBULATORY_CARE_PROVIDER_SITE_OTHER): Payer: Medicare Other

## 2020-12-20 ENCOUNTER — Other Ambulatory Visit: Payer: Self-pay

## 2020-12-20 DIAGNOSIS — I493 Ventricular premature depolarization: Secondary | ICD-10-CM

## 2020-12-20 DIAGNOSIS — I5043 Acute on chronic combined systolic (congestive) and diastolic (congestive) heart failure: Secondary | ICD-10-CM | POA: Diagnosis not present

## 2020-12-20 LAB — ECHOCARDIOGRAM LIMITED: S' Lateral: 4.1 cm

## 2020-12-20 MED ORDER — PERFLUTREN LIPID MICROSPHERE
1.0000 mL | INTRAVENOUS | Status: AC | PRN
  Administered 2020-12-20: 4 mL via INTRAVENOUS

## 2021-01-04 ENCOUNTER — Ambulatory Visit (INDEPENDENT_AMBULATORY_CARE_PROVIDER_SITE_OTHER): Payer: Medicare Other | Admitting: Cardiology

## 2021-01-04 ENCOUNTER — Other Ambulatory Visit: Payer: Self-pay

## 2021-01-04 ENCOUNTER — Encounter: Payer: Self-pay | Admitting: Cardiology

## 2021-01-04 ENCOUNTER — Telehealth (HOSPITAL_COMMUNITY): Payer: Self-pay | Admitting: Vascular Surgery

## 2021-01-04 VITALS — BP 126/80 | HR 98 | Ht 63.0 in | Wt 245.6 lb

## 2021-01-04 DIAGNOSIS — I5042 Chronic combined systolic (congestive) and diastolic (congestive) heart failure: Secondary | ICD-10-CM

## 2021-01-04 DIAGNOSIS — E1142 Type 2 diabetes mellitus with diabetic polyneuropathy: Secondary | ICD-10-CM | POA: Diagnosis not present

## 2021-01-04 MED ORDER — LOSARTAN POTASSIUM 25 MG PO TABS: 25.0000 mg | ORAL_TABLET | Freq: Every day | ORAL | 3 refills | Status: DC

## 2021-01-04 MED ORDER — FUROSEMIDE 40 MG PO TABS: 40.0000 mg | ORAL_TABLET | Freq: Every day | ORAL | 3 refills | Status: DC

## 2021-01-04 MED ORDER — POTASSIUM CHLORIDE ER 10 MEQ PO TBCR: 10.0000 meq | EXTENDED_RELEASE_TABLET | Freq: Every day | ORAL | 3 refills | Status: DC

## 2021-01-04 NOTE — Patient Instructions (Addendum)
Medication Instructions:  Your physician has recommended you make the following change in your medication:   1.  Increase your lasix (furosemide) --Take 40 mg by mouth once a day  2.  Start taking potassium 10 meq-  Take one tablet by mouth once a day  3.  Increase your losartan- Take 25 mg by mouth once a day   *If you need a refill on your cardiac medications before your next appointment, please call your pharmacy*  Lab Work: You will get lab work today:  BMP, magnesium, BNP  If you have labs (blood work) drawn today and your tests are completely normal, you will receive your results only by: Marland Kitchen MyChart Message (if you have MyChart) OR . A paper copy in the mail If you have any lab test that is abnormal or we need to change your treatment, we will call you to review the results.  Testing/Procedures: None ordered.  Follow-Up: At [facility], you and your health needs are our priority.  As part of our continuing mission to provide you with exceptional heart care, we have created designated Provider Care Teams.  These Care Teams include your primary Cardiologist (physician) and Advanced Practice Providers (APPs -  Physician Assistants and Nurse Practitioners) who all work together to provide you with the care you need, when you need it.  Your next appointment:    Your physician wants you to follow-up in: 2-3 weeks with Christell Faith.    Your physician wants you to follow-up in: 3 months with Dr. Quentin Ore.

## 2021-01-04 NOTE — Progress Notes (Signed)
Electrophysiology Office Follow up Visit Note:    Date:  01/04/2021   ID:  Kim Buchanan, DOB 1950/12/20, MRN [id number]  PCP:  Venita Lick, NP  [facility] Cardiologist:  Kim Rogue, MD  [facility] Electrophysiologist:  Kim Epley, MD    Interval History:    Kim Buchanan is a 70 y.o. female who presents for a follow up visit.  I last saw her in clinic October 05, 2020 for her nonischemic cardiomyopathy.  At the last appointment I stopped her losartan and started Entresto.  I also referred her to the heart failure clinic in [place].   She was seen in follow-up with Christell Faith on October 28, 2020.  She did not tolerate Entresto and developed a cough so the Entresto was stopped and she was put back on losartan.  She continues to have shortness of breath and fluid retention. No syncope/presyncope. She feels fluid accumulation in her abdomen and sometimes in her legs. Appetite good.   Past Medical History:  Diagnosis Date  . Anxiety   . Arthritis    lower back  . COPD (chronic obstructive pulmonary disease) ([place])   . Depression   . Diabetes mellitus without complication ([place])   . GERD (gastroesophageal reflux disease)   . Hyperlipidemia   . Morbid obesity ([place])   . NICM (nonischemic cardiomyopathy) ([place])    a. 03/2019 Echo: Nl LVEF; b. 09/04/2019 Echo: EF 25-30%, Gr1 DD, glob HK, nl RV fxn; c. 09/11/2019 Cath: nonobs LAD/RCA dzs. EF 50-55%. RA 10, RV 29/8(11), PA 27/16(20), PCWP 12. CO/CI 5.04/2.51.  Marland Kitchen Nonobstructive CAD (coronary artery disease)    a. 03/2019 MV [facility] [place]): EF 63%, ant ischemia; b. 08/2019 Cath: LM nl, LAD 44m RCA 70p (iFR nl @ 0.97), 50p. EF 50-55%-->Med Rx.  . Osteoporosis   . Panic attack   . PTSD (post-traumatic stress disorder)   . Rectal fistula   . Sleep apnea    a. Prev wore CPAP but lost insurance and hasn't been using since (now has insurance).  . Wears dentures    full upper    Past Surgical History:  Procedure  Laterality Date  . ABDOMINAL HYSTERECTOMY    . CARDIAC CATHETERIZATION    . COLONOSCOPY  2014  . COLONOSCOPY WITH PROPOFOL N/A 10/01/2016   Procedure: COLONOSCOPY WITH PROPOFOL;  Surgeon: DLucilla Lame MD;  [place] [facility]  Service: Endoscopy;  Laterality: N/A;  . CORONARY PRESSURE WIRE/FFR WITH 3D MAPPING N/A 09/11/2019   Procedure: Coronary Pressure Wire/FFR w/3D Mapping;  Surgeon: ENelva Bush MD;  [place] [facility];  Service: Cardiovascular;  Laterality: N/A;  . CYSTOCELE REPAIR N/A 12/17/2016   Procedure: ANTERIOR REPAIR (CYSTOCELE);  Surgeon: MBrayton Mars MD;  Location: [facility];  Service: Gynecology;  Laterality: N/A;  . ESOPHAGOGASTRODUODENOSCOPY (EGD) WITH PROPOFOL N/A 08/11/2019   Procedure: ESOPHAGOGASTRODUODENOSCOPY (EGD) WITH PROPOFOL;  Surgeon: WLucilla Lame MD;  Location: [facility];  Service: Endoscopy;  Laterality: N/A;  . POLYPECTOMY  10/01/2016   Procedure: POLYPECTOMY;  Surgeon: DLucilla Lame MD;  [place] [facility]  Service: Endoscopy;;  . RECTAL SURGERY  06/28/2015   Rectal prolapse, laparoscopic rectopexy the coldCharmont, MD; [facility]  . RIGHT/LEFT HEART CATH AND CORONARY ANGIOGRAPHY Bilateral 09/11/2019   Procedure: RIGHT/LEFT HEART CATH AND CORONARY ANGIOGRAPHY;  Surgeon: GMinna Merritts MD;  [facility];  Service: Cardiovascular;  Laterality: Bilateral;  . TUBAL LIGATION    . VAGINAL HYSTERECTOMY Bilateral 12/17/2016   Procedure:  HYSTERECTOMY VAGINAL WITH BILATERAL SALPINGO OOPHERECTOMY;  Surgeon: Brayton Mars, MD;  Location: [facility];  Service: Gynecology;  Laterality: Bilateral;    Current Medications: Current Meds  Medication Sig  . aspirin 81 MG tablet Take 81 mg by mouth daily.   Marland Kitchen atorvastatin (LIPITOR) 10 MG tablet Take 1 tablet (10 mg total) by mouth at bedtime.  . blood glucose meter kit and supplies Dispense based on patient and insurance preference. Check fasting blood sugar  once daily. (FOR ICD E11.69).  Marland Kitchen gabapentin (NEURONTIN) 300 MG capsule Take 1 capsule (300 mg total) by mouth 3 (three) times daily.  . isosorbide mononitrate (IMDUR) 30 MG 24 hr tablet TAKE 1 TABLET BY MOUTH  DAILY  . Lancets (ONETOUCH DELICA PLUS [id number]) MISC daily.   Marland Kitchen losartan (COZAAR) 25 MG tablet Take 0.5 tablets (12.5 mg total) by mouth daily.  . metFORMIN (GLUCOPHAGE) 1000 MG tablet Take 1 tablet (1,000 mg total) by mouth 2 (two) times daily with a meal.  . metoprolol succinate (TOPROL-XL) 25 MG 24 hr tablet TAKE 1 AND 1/2 TABLETS BY  MOUTH DAILY  . ONETOUCH ULTRA test strip 1 each daily.  . pantoprazole (PROTONIX) 40 MG tablet Take 1 tablet (40 mg total) by mouth daily.  . polyethylene glycol powder (GLYCOLAX/MIRALAX) 17 GM/SCOOP powder Take 1-2 doses daily as needed for constipation/to prevent constipation  . QUEtiapine (SEROQUEL) 100 MG tablet TAKE 1 TABLET BY MOUTH AT  BEDTIME  . spironolactone (ALDACTONE) 25 MG tablet Take 0.5 tablets (12.5 mg total) by mouth daily.     Allergies:   Lisinopril   Social History   Socioeconomic History  . Marital status: Married    Spouse name: Paediatric nurse  . Number of children: 1  . Years of education: Not on file  . Highest education level: 9th grade  Occupational History    Employer: DISABLED    Comment: for panic attacks  Tobacco Use  . Smoking status: Former Smoker    Packs/day: 1.50    Years: 50.00    Pack years: 75.00    Types: Cigarettes    Quit date: 11/29/2018    Years since quitting: 2.1  . Smokeless tobacco: Never Used  . Tobacco comment: started at age 61  Vaping Use  . Vaping Use: Never used  Substance and Sexual Activity  . Alcohol use: No    Alcohol/week: 0.0 standard drinks  . Drug use: No  . Sexual activity: Not Currently  Other Topics Concern  . Not on file  Social History Narrative   Pt had one son, who died when was 85 YO in a MVA.   Social Determinants of Health   Financial Resource Strain: Low Risk   .  Difficulty of Paying Living Expenses: Not hard at all  Food Insecurity: No Food Insecurity  . Worried About Charity fundraiser in the Last Year: Never true  . Ran Out of Food in the Last Year: Never true  Transportation Needs: No Transportation Needs  . Lack of Transportation (Medical): No  . Lack of Transportation (Non-Medical): No  Physical Activity: Inactive  . Days of Exercise per Week: 0 days  . Minutes of Exercise per Session: 0 min  Stress: No Stress Concern Present  . Feeling of Stress : Not at all  Social Connections: Not on file     Family History: The patient's family history includes Cancer in her sister; Diabetes in her brother, brother, mother, sister, and sister; Healthy in her sister; Heart  disease in her brother and father; Hypertension in her father; Lymphoma in her brother and brother. There is no history of Breast cancer, Ovarian cancer, or Colon cancer.  ROS:   Please see the history of present illness.    All other systems reviewed and are negative.  EKGs/Labs/Other Studies Reviewed:    The following studies were reviewed today:  December 20, 2020 echo personally reviewed Left ventricular function moderately decreased, 30% Global hypokinesis of the left ventricle RV function appears normal Mild MR  08/2019 FFR and LHC  Mid LAD lesion is 30% stenosed.  Prox RCA-1 lesion is 70% stenosed.  Prox RCA-2 lesion is 50% stenosed.  The left ventricular ejection fraction is 50-55% by visual estimate. LV end diastolic pressure is normal.  1. Single vessel coronary with sequential 70% and 50% proximal and mid RCA stenoses that are not hemodynamically significant (iFR = 0.97).  See Dr. Donivan Scull diagnostic catheterization report for further details of right/left heart catheterization.     EKG:  The ekg ordered today demonstrates sinus rhythm. QRS duration 150m.  Recent Labs: 06/10/2020: Magnesium 1.9 10/17/2020: TSH 2.440 10/27/2020: BUN 23; Creatinine, Ser  0.78; Potassium 4.4; Sodium 143  Recent Lipid Panel    Component Value Date/Time   CHOL 68 (L) 06/15/2020 1552   TRIG 127 06/15/2020 1552   HDL 35 (L) 06/15/2020 1552   CHOLHDL 1.5 11/30/2019 1135   CHOLHDL 1.7 01/01/2018 0000   VLDL 29 08/06/2016 0824   LDLCALC 10 06/15/2020 1552   LDLCALC 29 01/01/2018 0000    Physical Exam:    VS:  BP 126/80   Pulse 98   Ht '5\' 3"'  (1.6 m)   Wt 245 lb 9.6 oz (111.4 kg)   SpO2 97%   BMI 43.51 kg/m     Wt Readings from Last 3 Encounters:  01/04/21 245 lb 9.6 oz (111.4 kg)  12/02/20 245 lb (111.1 kg)  11/23/20 241 lb (109.3 kg)     GEN:  Well nourished, well developed in no acute distress.  Morbidly obese HEENT: Normal NECK: No JVD; No carotid bruits LYMPHATICS: No lymphadenopathy CARDIAC: RRR, no murmurs, rubs, gallops RESPIRATORY:  Clear to auscultation without rales, wheezing or rhonchi  ABDOMEN: Soft, non-tender, non-distended MUSCULOSKELETAL: Trace pitting bilateral lower extremity edema; No deformity  SKIN: Warm and dry NEUROLOGIC:  Alert and oriented x 3 PSYCHIATRIC:  Normal affect   ASSESSMENT:    1. Chronic combined systolic and diastolic heart failure ([place]   2. Morbid obesity ([place]   3. Type 2 diabetes mellitus with peripheral neuropathy (HCC)    PLAN:    In order of problems listed above:  1. Chronic combined systolic diastolic heart failure NYHA class III symptoms.  Warm and mildly volume overloaded on my exam today.  Her QRS duration is 114 ms on today's EKG. While she has coronary artery disease, it is not flow-limiting by IFR in the pattern of hypokinesis on echo was suggestive of a nonischemic etiology.  While she is on good medical therapy, her doses are suboptimal and should be adjusted further.  There is been some improvement in her ejection fraction over time.  I am hopeful with further titration of her medical therapy that we can get her ejection fraction up above 35%.  She does not weigh her self daily.  We  had an extensive discussion at the last visit about trying to optimize her medications further using Entresto but unfortunately she did not tolerate the medication and developed cough.  Today, we will increase her Lasix to 40 mg once daily.  I will add potassium supplementation along with this.  I will also increase her losartan to 25 mg once daily.  I will get a BMP, mag and a BNP.  We will plan for her to see Christell Faith, PA-C in the next few weeks to see how she is doing with this increase and further titrate her medication regimen.  The goal should be to increase her losartan and metoprolol.  We discussed daily weights during today's visit.  We discussed 2 L fluid restriction as well during today's visit.  I will plan to see her back in 3 months.  2.  Morbid obesity Likely contributing to some of her symptoms of shortness of breath.  3.  Diabetes Continue home medication regimen.  Medication Adjustments/Labs and Tests Ordered: Current medicines are reviewed at length with the patient today.  Concerns regarding medicines are outlined above.  No orders of the defined types were placed in this encounter.  No orders of the defined types were placed in this encounter.    Signed, Lars Mage, MD, [facility]  01/04/2021 9:04 AM    Electrophysiology [facility]

## 2021-01-04 NOTE — Telephone Encounter (Signed)
Left pt message w. New chf appt 3/22 @ 2 pm w/ [place], asked pt to call back to confirm

## 2021-01-05 LAB — BASIC METABOLIC PANEL
BUN/Creatinine Ratio: 28 (ref 12–28)
BUN: 20 mg/dL (ref 8–27)
CO2: 20 mmol/L (ref 20–29)
Calcium: 9.3 mg/dL (ref 8.7–10.3)
Chloride: 104 mmol/L (ref 96–106)
Creatinine, Ser: 0.71 mg/dL (ref 0.57–1.00)
Glucose: 149 mg/dL — ABNORMAL HIGH (ref 65–99)
Potassium: 4.8 mmol/L (ref 3.5–5.2)
Sodium: 144 mmol/L (ref 134–144)
eGFR: 93 mL/min/{1.73_m2} (ref >59)

## 2021-01-05 LAB — BRAIN NATRIURETIC PEPTIDE: BNP: 48.4 pg/mL (ref 0.0–100.0)

## 2021-01-05 LAB — MAGNESIUM: Magnesium: 2.1 mg/dL (ref 1.6–2.3)

## 2021-01-10 ENCOUNTER — Encounter (HOSPITAL_COMMUNITY): Payer: Medicare Other | Admitting: Cardiology

## 2021-01-17 ENCOUNTER — Ambulatory Visit: Payer: Medicare Other | Admitting: Nurse Practitioner

## 2021-01-18 ENCOUNTER — Other Ambulatory Visit: Payer: Self-pay | Admitting: Nurse Practitioner

## 2021-01-18 NOTE — Telephone Encounter (Signed)
Requested Prescriptions  Pending Prescriptions Disp Refills  . metoprolol succinate (TOPROL-XL) 25 MG 24 hr tablet [Pharmacy Med Name: Metoprolol Succinate ER 25 MG Oral Tablet Extended Release 24 Hour] 135 tablet 2    Sig: TAKE 1 AND 1/2 TABLETS BY  MOUTH DAILY     Cardiovascular:  Beta Blockers Passed - 01/18/2021 10:04 PM      Passed - Last BP in normal range    BP Readings from Last 1 Encounters:  01/04/21 126/80         Passed - Last Heart Rate in normal range    Pulse Readings from Last 1 Encounters:  01/04/21 98         Passed - Valid encounter within last 6 months    Recent Outpatient Visits          3 months ago Type 2 diabetes mellitus with peripheral neuropathy ([place])   [place], Jolene T, NP   7 months ago Type 2 diabetes mellitus with peripheral neuropathy ([place])   [place], [place] T, NP   9 months ago Insomnia, unspecified type   [facility], [place], [place]   10 months ago Chronic left-sided low back pain without sciatica   [facility], [place]   10 months ago Type 2 diabetes mellitus with other specified complication, without long-term current use of insulin [facility])   [place], Dionne Bucy, MD      Future Appointments            In 5 days Venita Lick, NP [facility], [place]   In 1 week [name], Areta Haber, PA-C [facility], [place]   In 2 months Quentin Ore, Hilton Cork, MD [facility]   In 10 months  [facility], [place]

## 2021-01-23 ENCOUNTER — Telehealth: Payer: Self-pay

## 2021-01-23 ENCOUNTER — Telehealth: Payer: Self-pay | Admitting: Physician Assistant

## 2021-01-23 ENCOUNTER — Ambulatory Visit
Admission: RE | Admit: 2021-01-23 | Discharge: 2021-01-23 | Disposition: A | Discharge status: Home / Self Care | Payer: Medicare Other | Source: Ambulatory Visit | Attending: Nurse Practitioner | Admitting: Nurse Practitioner

## 2021-01-23 ENCOUNTER — Other Ambulatory Visit: Payer: Self-pay

## 2021-01-23 ENCOUNTER — Ambulatory Visit: Payer: Medicare Other | Admitting: Pharmacist

## 2021-01-23 ENCOUNTER — Telehealth: Payer: Self-pay | Admitting: Pharmacist

## 2021-01-23 ENCOUNTER — Encounter: Payer: Self-pay | Admitting: Nurse Practitioner

## 2021-01-23 ENCOUNTER — Ambulatory Visit
Admission: RE | Admit: 2021-01-23 | Discharge: 2021-01-23 | Disposition: A | Discharge status: Home / Self Care | Payer: Medicare Other | Source: Home / Self Care | Attending: Nurse Practitioner | Admitting: Nurse Practitioner

## 2021-01-23 ENCOUNTER — Ambulatory Visit (INDEPENDENT_AMBULATORY_CARE_PROVIDER_SITE_OTHER): Payer: Medicare Other | Admitting: Nurse Practitioner

## 2021-01-23 VITALS — BP 126/83 | HR 94 | Temp 97.4°F | Wt 248.8 lb

## 2021-01-23 DIAGNOSIS — E1169 Type 2 diabetes mellitus with other specified complication: Secondary | ICD-10-CM

## 2021-01-23 DIAGNOSIS — E1142 Type 2 diabetes mellitus with diabetic polyneuropathy: Secondary | ICD-10-CM

## 2021-01-23 DIAGNOSIS — G4701 Insomnia due to medical condition: Secondary | ICD-10-CM

## 2021-01-23 DIAGNOSIS — I5020 Unspecified systolic (congestive) heart failure: Secondary | ICD-10-CM | POA: Diagnosis not present

## 2021-01-23 DIAGNOSIS — I428 Other cardiomyopathies: Secondary | ICD-10-CM | POA: Diagnosis not present

## 2021-01-23 DIAGNOSIS — I25118 Atherosclerotic heart disease of native coronary artery with other forms of angina pectoris: Secondary | ICD-10-CM

## 2021-01-23 DIAGNOSIS — I700 Atherosclerosis of aorta: Secondary | ICD-10-CM

## 2021-01-23 DIAGNOSIS — F331 Major depressive disorder, recurrent, moderate: Secondary | ICD-10-CM

## 2021-01-23 DIAGNOSIS — E1159 Type 2 diabetes mellitus with other circulatory complications: Secondary | ICD-10-CM | POA: Diagnosis not present

## 2021-01-23 DIAGNOSIS — G4733 Obstructive sleep apnea (adult) (pediatric): Secondary | ICD-10-CM

## 2021-01-23 DIAGNOSIS — E785 Hyperlipidemia, unspecified: Secondary | ICD-10-CM

## 2021-01-23 DIAGNOSIS — M7989 Other specified soft tissue disorders: Secondary | ICD-10-CM | POA: Diagnosis not present

## 2021-01-23 DIAGNOSIS — I152 Hypertension secondary to endocrine disorders: Secondary | ICD-10-CM | POA: Diagnosis not present

## 2021-01-23 DIAGNOSIS — K760 Fatty (change of) liver, not elsewhere classified: Secondary | ICD-10-CM | POA: Diagnosis not present

## 2021-01-23 DIAGNOSIS — R0602 Shortness of breath: Secondary | ICD-10-CM | POA: Diagnosis not present

## 2021-01-23 DIAGNOSIS — J410 Simple chronic bronchitis: Secondary | ICD-10-CM | POA: Diagnosis not present

## 2021-01-23 MED ORDER — DAPAGLIFLOZIN PROPANEDIOL 10 MG PO TABS
10.0000 mg | ORAL_TABLET | Freq: Every day | ORAL | 2 refills | Status: DC
Start: 2021-01-23 — End: 2021-09-20

## 2021-01-23 NOTE — Assessment & Plan Note (Addendum)
Chronic, ongoing. At this time will continue Seroquel for mood and sleep, however in future may benefit from reduction of this due to concern for weight.  Recommend modest weight loss.  Would benefit from consistent use of CPAP.

## 2021-01-23 NOTE — Assessment & Plan Note (Signed)
Noted on CT cardiac imaging 06/09/20.  Will continue ASA and statin for prevention.  Continue to collaborate with cardiology.

## 2021-01-23 NOTE — Assessment & Plan Note (Signed)
Chronic, ongoing.  Continue current medication regimen and adjust as needed. Lipid panel today. 

## 2021-01-23 NOTE — Assessment & Plan Note (Signed)
Chronic, ongoing.  Denies SI/HI.  At this time will continue Seroquel for mood and sleep, however in future may benefit from reduction of this due to concern for weight.

## 2021-01-23 NOTE — Assessment & Plan Note (Signed)
Noted on past CT lung screening, check CMP today and recommend modest weight loss focus.

## 2021-01-23 NOTE — Assessment & Plan Note (Signed)
Chronic, ongoing.  Currently followed by cardiology.  Continue all current medications as prescribed by them.  Recent note reviewed.  CMP and lipid panel today.

## 2021-01-23 NOTE — Assessment & Plan Note (Signed)
No current CPAP due to cost, continue to work with CCM team. 

## 2021-01-23 NOTE — Telephone Encounter (Signed)
  Chronic Care Management   Note  01/23/2021 Name: Kim Buchanan MRN: [id number] DOB: 11-25-1950    Patient unable to afford [place].  30 day supply voucher for Farxiga 10 mg sent to [employer].  Patient signed patient assistance application in office today.   Follow up plan: Patient scheduled for initial visit with me on Friday 01/27/21 at 12;30.  Junita Push. Kenton Kingfisher PharmD, [facility] [phone]   SIGNATURE

## 2021-01-23 NOTE — Telephone Encounter (Signed)
Per Dr.Jolene Ned Card, NP "Can you call over to [facility] [place] -- talk to Sara Lee or Dr. Quentin Ore nurse if needed but see if we can get Ballew in tomorrow vs 8th, concern for acute HF episode. Will increase Lasix with extra 20 MG today for 3 days but has increased SOB and some fluid retention today -- would try to avoid hospital she would prefer to avoid." Spoke with Sabrina at [facility] and she informed me she would reach out and send the nurses a message to let them know what symptoms the patients were experiencing at today's visit and have them give our office a call back at their earliest convenience as they were in clinic at this time.

## 2021-01-23 NOTE — Patient Instructions (Addendum)
Take Lasix 60 MG today (1 and 1/2 tablets).   Go to cardiology at 11 AM tomorrow and then keep Friday visit with Thurmond Butts.    Heart Failure Action Plan A heart failure action plan helps you understand what to do when you have symptoms of heart failure. Your action plan is a color-coded plan that lists the symptoms to watch for and indicates what actions to take.  If you have symptoms in the red zone, you need medical care right away.  If you have symptoms in the yellow zone, you are having problems.  If you have symptoms in the green zone, you are doing well. Follow the plan that was created by you and your health care provider. Review your plan each time you visit your health care provider. Red zone These signs and symptoms mean you should get medical help right away:  You have trouble breathing when resting.  You have a dry cough that is getting worse.  You have swelling or pain in your legs or abdomen that is getting worse.  You suddenly gain more than 2-3 lb (0.9-1.4 kg) in 24 hours, or more than 5 lb (2.3 kg) in a week. This amount may be more or less depending on your condition.  You have trouble staying awake or you feel confused.  You have chest pain.  You do not have an appetite.  You pass out.  You have worsening sadness or depression. If you have any of these symptoms, call your local emergency services (911 in the [place] right away. Do not drive yourself to the hospital.   Yellow zone These signs and symptoms mean your condition may be getting worse and you should make some changes:  You have trouble breathing when you are active, or you need to sleep with your head raised on extra pillows to help you breathe.  You have swelling in your legs or abdomen.  You gain 2-3 lb (0.9-1.4 kg) in 24 hours, or 5 lb (2.3 kg) in a week. This amount may be more or less depending on your condition.  You get tired easily.  You have trouble sleeping.  You have a dry cough. If you  have any of these symptoms:  Contact your health care provider within the next day.  Your health care provider may adjust your medicines.   Green zone These signs mean you are doing well and can continue what you are doing:  You do not have shortness of breath.  You have very little swelling or no new swelling.  Your weight is stable (no gain or loss).  You have a normal activity level.  You do not have chest pain or any other new symptoms.   Follow these instructions at home:  Take over-the-counter and prescription medicines only as told by your health care provider.  Weigh yourself daily. Your target weight is __________ lb (__________ kg). ? Call your health care provider if you gain more than __________ lb (__________ kg) in 24 hours, or more than __________ lb (__________ kg) in a week. ? Health care provider name: _____________________________________________________ ? Health care provider phone number: _____________________________________________________  Eat a heart-healthy diet. Work with a diet and nutrition specialist (dietitian) to create an eating plan that is best for you.  Keep all follow-up visits. This is important. Where to find more information  American Heart Association: www.heart.org Summary  A heart failure action plan helps you understand what to do when you have symptoms of heart failure.  Follow the action plan that was created by you and your health care provider.  Get help right away if you have any symptoms in the red zone. This information is not intended to replace advice given to you by your health care provider. Make sure you discuss any questions you have with your health care provider. Document Revised: 05/23/2020 Document Reviewed: 05/23/2020 Elsevier Patient Education  2021 [employer].

## 2021-01-23 NOTE — Progress Notes (Signed)
BP 126/83   Pulse 94 Comment: apical  Temp (!) 97.4 F (36.3 C) (Oral)   Wt 248 lb 12.8 oz (112.9 kg)   SpO2 95%   BMI 44.07 kg/m    Subjective:    Patient ID: Kim Buchanan, female    DOB: Sep 12, 1951, 70 y.o.   MRN: [id number]  HPI: Kim Buchanan is a 70 y.o. female  Chief Complaint  Patient presents with  . Hypertension  . Hyperlipidemia  . Diabetes  . COPD  . Insomnia  . Nausea    Patient states she has been having the feeling of being nauseous a lot lately.  . Fatigue   DIABETES Last A1C in December 7.7%.  She continues on Metformin 1000 MG BID.  Started Farxiga in August 2021, was able to get to 10 MG, however she stopped taking this some weeks back per her report due to cost.  Last GFR >60.   Hypoglycemic episodes:no Polydipsia/polyuria: no Visual disturbance: no Chest pain: no Paresthesias: no Glucose Monitoring: no  Accucheck frequency: Not Checking due to poor diet adherence  Fasting glucose:  Post prandial:  Evening:  Before meals: Taking Insulin?: no  Long acting insulin:  Short acting insulin: Blood Pressure Monitoring: rarely Retinal Examination: Not up to Date -- she reports having at [place] first of year 2022 Foot Exam: Up to Date Pneumovax: Up to Date Influenza: Up to Date Aspirin: yes   COPD No current inhalers.  Last saw pulmonary on 07/12/20 with minimal obstruction and mild restriction noted on PFT by them -- she was given trial of Stiolto via samples -- this offered her no benefit.  Past smoker, she smoked from age 10 to age 10 -- smoked 1 1/2 PPD.  Had initial lung CA screening on 11/23/20 noting aortic atherosclerosis and hepatic steatosis. COPD status: stable Satisfied with current treatment?: yes Oxygen use: no Dyspnea frequency: at baseline, no worsening Cough frequency: occasional Rescue inhaler frequency:  none Limitation of activity: no Productive cough: none Last Spirometry: with pulmonary Pneumovax: Up to  Date Influenza: Up to Date  HYPERTENSION / HYPERLIPIDEMIA/HF Followed by cardiology and last saw Dr. Quentin Ore 01/04/21 (was referred to HF Clinic in [place]) -- in past they did trial of Entresto which she did not tolerate and she was placed back on Losartan, now at 25 MG once daily.  At recent visit with them her Lasix was increased to 40 MG daily and K+ supplement added.  Their goal is to increase Losartan and Metoprolol overtime.   Echo on 12/20/20 noted EF 30-35%, LVH mild, + mild mitral valve regurgitation.  There was plan for CRT-D implant in futre. Continues on Imdur, Atorvastatin, Metoprolol XL, Lasix, Spironolactone, and ASA.  History of aortic atherosclerosis noted on past imaging.   She reports she is weighing herself daily, but reports her scale is not working correctly.  She does endorse increased SOB recently.  She does endorse some nausea lately and increased fatigue.  Changes between sleeping in recliner and bed, recently has been sleeping in recliner more -- breaths better, more comfortable and less orthopnea.    Has history of cough with ACE and ARB in past.   Satisfied with current treatment? yes Duration of hypertension: chronic BP monitoring frequency: rarely BP range:  BP medication side effects: no Duration of hyperlipidemia: chronic Cholesterol medication side effects: no Cholesterol supplements: none Medication compliance: good compliance Aspirin: yes Recent stressors: no Recurrent headaches: no Visual changes: no Palpitations: no Dyspnea: increased over  past days, she is unsure how many Chest pain: no Lower extremity edema: yes Dizzy/lightheaded: a little  INSOMNIA Continues on Seroquel 100 MG QHS started by previous PCP.  She reports being told she has sleep apnea, does not use CPAP at this time -- used years ago.    Pt is aware of risks of psychoactive medication use to include increased sedation, respiratory suppression, falls, extrapyramidal movements,   dependence and cardiovascular events.  Pt would like to continue treatment as benefit determined to outweigh risk.  Duration: chronic Satisfied with sleep quality: no Difficulty falling asleep: no Difficulty staying asleep: yes Waking a few hours after sleep onset: yes Early morning awakenings: yes Daytime hypersomnolence: no Wakes feeling refreshed: no Good sleep hygiene: yes Apnea: yes Snoring: no Depressed/anxious mood: no Recent stress: no Restless legs/nocturnal leg cramps: no Chronic pain/arthritis: no History of sleep study: yes Treatments attempted: melatonin  Depression screen [facility] 2/9 01/23/2021 12/02/2020 10/17/2020 06/15/2020 03/11/2020  Decreased Interest 0 0 0 0 1  Down, Depressed, Hopeless 0 0 0 0 -  PHQ - 2 Score 0 0 0 0 1  Altered sleeping 0 0 0 1 3  Tired, decreased energy '3 3 3 1 3  ' Change in appetite '1 3 3 3 3  ' Feeling bad or failure about yourself  0 0 0 0 -  Trouble concentrating 0 0 1 0 3  Moving slowly or fidgety/restless 0 0 0 0 1  Suicidal thoughts 0 0 0 0 -  PHQ-9 Score '4 6 7 5 14  ' Difficult doing work/chores Not difficult at all Not difficult at all Not difficult at all - -  Some recent data might be hidden     Relevant past medical, surgical, family and social history reviewed and updated as indicated. Interim medical history since our last visit reviewed. Allergies and medications reviewed and updated.  Review of Systems  Constitutional: Positive for fatigue. Negative for activity change, appetite change, diaphoresis and fever.  Respiratory: Positive for shortness of breath. Negative for cough and chest tightness.   Cardiovascular: Positive for leg swelling. Negative for chest pain and palpitations.  Gastrointestinal: Positive for abdominal distention (mild reported) and nausea. Negative for abdominal pain, constipation, diarrhea and vomiting.  Endocrine: Negative for cold intolerance, heat intolerance, polydipsia, polyphagia and polyuria.   Neurological: Negative.   Psychiatric/Behavioral: Negative.     Per HPI unless specifically indicated above     Objective:    BP 126/83   Pulse 94 Comment: apical  Temp (!) 97.4 F (36.3 C) (Oral)   Wt 248 lb 12.8 oz (112.9 kg)   SpO2 95%   BMI 44.07 kg/m   Wt Readings from Last 3 Encounters:  01/23/21 248 lb 12.8 oz (112.9 kg)  01/04/21 245 lb 9.6 oz (111.4 kg)  12/02/20 245 lb (111.1 kg)    Physical Exam Vitals and nursing note reviewed.  Constitutional:      General: She is awake. She is not in acute distress.    Appearance: She is well-developed and well-groomed. She is morbidly obese. She is not ill-appearing, toxic-appearing or diaphoretic.  HENT:     Head: Normocephalic.     Right Ear: Hearing normal.     Left Ear: Hearing normal.  Eyes:     General: Lids are normal.        Right eye: No discharge.        Left eye: No discharge.     Conjunctiva/sclera: Conjunctivae normal.     Pupils: Pupils  are equal, round, and reactive to light.  Neck:     Thyroid: No thyromegaly.     Vascular: No carotid bruit or JVD.  Cardiovascular:     Rate and Rhythm: Normal rate and regular rhythm.     Heart sounds: Normal heart sounds. No murmur heard. No gallop.      Comments: Edema bilateral legs extending from ankle to mid-shin. Pulmonary:     Effort: Pulmonary effort is normal. No accessory muscle usage or respiratory distress.     Breath sounds: Normal breath sounds. No wheezing or rhonchi.     Comments: Mild SOB noted with talking and walking to lab. Abdominal:     General: Bowel sounds are normal. There is distension (mild noted, soft).     Palpations: Abdomen is soft.     Tenderness: There is no abdominal tenderness.  Musculoskeletal:     Cervical back: Normal range of motion and neck supple.     Right lower leg: 2+ Edema present.     Left lower leg: 2+ Edema present.  Skin:    General: Skin is warm and dry.  Neurological:     Mental Status: She is alert and  oriented to person, place, and time.  Psychiatric:        Attention and Perception: Attention normal.        Mood and Affect: Mood normal.        Speech: Speech normal.        Behavior: Behavior normal. Behavior is cooperative.        Thought Content: Thought content normal.   EKG My review and personal interpretation at Time: 1550   Indication: CHF  Rate: 96  Rhythm: sinus with frequent PVC noted Axis: normal Other: nonspecific st abn, old anteroseptal infarct -- similar in appearance to EKG on 01/04/21 with cardiology  Results for orders placed or performed in visit on 71/21/97  Basic Metabolic Panel (BMET)  Result Value Ref Range   Glucose 149 (H) 65 - 99 mg/dL   BUN 20 8 - 27 mg/dL   Creatinine, Ser 0.71 0.57 - 1.00 mg/dL   eGFR 93 >59 mL/min/1.73   BUN/Creatinine Ratio 28 12 - 28   Sodium 144 134 - 144 mmol/L   Potassium 4.8 3.5 - 5.2 mmol/L   Chloride 104 96 - 106 mmol/L   CO2 20 20 - 29 mmol/L   Calcium 9.3 8.7 - 10.3 mg/dL  Magnesium  Result Value Ref Range   Magnesium 2.1 1.6 - 2.3 mg/dL  B Nat Peptide  Result Value Ref Range   BNP 48.4 0.0 - 100.0 pg/mL      Assessment & Plan:   Problem List Items Addressed This Visit      Cardiovascular and Mediastinum   Hypertension associated with diabetes ([place])    Chronic, ongoing with BP at goal today.  Continue to collaborate with cardiology, recent notes and echo reviewed.  Continue current medication regimen and adjust as needed.  History of cough with ACE.  Recommend she monitor BP at home at least 3 days a week and document for providers.  Focus on DASH diet.  CMP today.  Return to office in 3 months.      Relevant Medications   dapagliflozin propanediol (FARXIGA) 10 MG TABS tablet   Other Relevant Orders   Bayer DCA Hb A1c Waived   Microalbumin, Urine Waived   Comprehensive metabolic panel   NICM (nonischemic cardiomyopathy) ([place])    Continue collaboration with cardiology and  current medication regimen as  prescribed by them.  Recent note reviewed.      CAD (coronary artery disease), native coronary artery    Chronic, ongoing.  Currently followed by cardiology.  Continue all current medications as prescribed by them.  Recent note reviewed.  CMP and lipid panel today.      Aortic atherosclerosis ([place])    Noted on CT cardiac imaging 06/09/20.  Will continue ASA and statin for prevention.  Continue to collaborate with cardiology.      HFrEF (heart failure with reduced ejection fraction) (HCC) - Primary    Chronic, ongoing with current increase symptoms - including increased edema and SOB, 3 pounds weight gain.  Followed by cardiology have reached out to them and able to get her in tomorrow to be seen + to keep Friday visit with Christell Faith PA-C there.  She would prefer to avoid hospitalization.  Educated her on HF today.  Restart Farxiga at 10 MG daily and increase Lasix with extra 20 MG daily x 3 days (total 60 MG daily x 3 days).  Check CMP, lipid, and BNP today.  Obtain CXR.  Birdena Crandall CCM PharmD at bedside and able to start assist for [facility].  Recommend: - Reminded to call for an overnight weight gain of >2 pounds or a weekly weight weight of >5 pounds - not adding salt to his food and has been reading food labels. Reviewed the importance of keeping daily sodium intake to <2034m daily  - If worsening symptoms advised her to immediately go to ER. Return in 6 weeks to meet new PCP and follow-up on cardiology plan for patient.      Relevant Orders   DG Chest 2 View   B Nat Peptide   EKG 12-Lead (Completed)     Respiratory   COPD (chronic obstructive pulmonary disease) (HCC)    Chronic, ongoing with no current inhalers.  Recommend continued cessation of smoking.  Will continue yearly lung CT screening.  Continue collaboration with pulmonary and monitoring.      OSA (obstructive sleep apnea)    No current CPAP due to cost, continue to work with CCM team.        Digestive   Hepatic  steatosis    Noted on past CT lung screening, check CMP today and recommend modest weight loss focus.        Endocrine   Hyperlipidemia associated with type 2 diabetes mellitus (HCC)    Chronic, ongoing.  Continue current medication regimen and adjust as needed.  Lipid panel today.      Relevant Medications   dapagliflozin propanediol (FARXIGA) 10 MG TABS tablet   Other Relevant Orders   Bayer DCA Hb A1c Waived   Lipid Panel w/o Chol/HDL Ratio   Type 2 diabetes mellitus with peripheral neuropathy (HCC)    Chronic, ongoing with A1C 8.6% today (upward trend), endorses poor diet adherence, and urine ALB 30 last visit -- recheck today, she will bring sample back to office.  Can not use ACE due to past history of cough.  Will continue Metformin 1000 MG BID and restart Farxiga 10 MG daily, this also benefits her HF.  JBirdena CrandallCCM PharmD at bedside [facility]  Educated patient on medications and that if ongoing elevations will consider addition of GLP1 next visit, which may benefit weight loss.  Recommend she monitor BS daily and document.  Focus on diabetes diet and regular activity.  Return to office in  6 weeks for follow-up and to meet new PCP.      Relevant Medications   dapagliflozin propanediol (FARXIGA) 10 MG TABS tablet   Other Relevant Orders   Bayer DCA Hb A1c Waived     Other   MDD (major depressive disorder)    Chronic, ongoing.  Denies SI/HI.  At this time will continue Seroquel for mood and sleep, however in future may benefit from reduction of this due to concern for weight.      Insomnia    Chronic, ongoing. At this time will continue Seroquel for mood and sleep, however in future may benefit from reduction of this due to concern for weight.  Recommend modest weight loss.  Would benefit from consistent use of CPAP.      Morbid obesity (HCC)    BMI 44.07 with T2DM and HTN + HF.  Recommended eating smaller high protein, low fat meals more  frequently and exercising 30 mins a day 5 times a week with a goal of 10-15lb weight loss in the next 3 months. Patient voiced their understanding and motivation to adhere to these recommendations.       Relevant Medications   dapagliflozin propanediol (FARXIGA) 10 MG TABS tablet      Time: 30 minutes, >50% spent counseling/or care coordination   Follow up plan: Return in about 6 weeks (around 03/06/2021) for T2DM and CHF with [name], meet new PCP.

## 2021-01-23 NOTE — Assessment & Plan Note (Signed)
Continue collaboration with cardiology and current medication regimen as prescribed by them.  Recent note reviewed.

## 2021-01-23 NOTE — Assessment & Plan Note (Signed)
Chronic, ongoing with no current inhalers.  Recommend continued cessation of smoking.  Will continue yearly lung CT screening.  Continue collaboration with pulmonary and monitoring.

## 2021-01-23 NOTE — Assessment & Plan Note (Signed)
Chronic, ongoing with A1C 8.6% today (upward trend), endorses poor diet adherence, and urine ALB 30 last visit -- recheck today, she will bring sample back to office.  Can not use ACE due to past history of cough.  Will continue Metformin 1000 MG BID and restart Farxiga 10 MG daily, this also benefits her HF.  Birdena Crandall CCM PharmD at bedside and able to start assist for [facility].  Educated patient on medications and that if ongoing elevations will consider addition of GLP1 next visit, which may benefit weight loss.  Recommend she monitor BS daily and document.  Focus on diabetes diet and regular activity.  Return to office in 6 weeks for follow-up and to meet new PCP.

## 2021-01-23 NOTE — Telephone Encounter (Signed)
Patient's PCP office is calling to discuss sooner appointment due to acute heart failure today, HR 47, shortness of breath, extreme weakness, and fluid retention. Please call to discuss.

## 2021-01-23 NOTE — Assessment & Plan Note (Signed)
BMI 44.07 with T2DM and HTN + HF.  Recommended eating smaller high protein, low fat meals more frequently and exercising 30 mins a day 5 times a week with a goal of 10-15lb weight loss in the next 3 months. Patient voiced their understanding and motivation to adhere to these recommendations.

## 2021-01-23 NOTE — Telephone Encounter (Signed)
Called and spoke with patients primary care office. Advised that I do have sooner appointment but it is not with her primary provider. Offered appointment for tomorrow and reviewed that if she prefers [facility] then we have that one on Friday as well. EKG reviewed from her visit and rate on that was 97. Spoke with Destiny and updated her on appointments and if patient prefers [facility] then she can come in on Friday and if no preference then she can come in tomorrow. She was appreciative and will review with provider. No further questions.

## 2021-01-23 NOTE — Assessment & Plan Note (Signed)
Chronic, ongoing with BP at goal today.  Continue to collaborate with cardiology, recent notes and echo reviewed.  Continue current medication regimen and adjust as needed.  History of cough with ACE.  Recommend she monitor BP at home at least 3 days a week and document for providers.  Focus on DASH diet.  CMP today.  Return to office in 3 months.

## 2021-01-23 NOTE — Assessment & Plan Note (Addendum)
Chronic, ongoing with current increase symptoms - including increased edema and SOB, 3 pounds weight gain.  Followed by cardiology have reached out to them and able to get her in tomorrow to be seen + to keep Friday visit with Christell Faith PA-C there.  She would prefer to avoid hospitalization.  Educated her on HF today.  Restart Farxiga at 10 MG daily and increase Lasix with extra 20 MG daily x 3 days (total 60 MG daily x 3 days).  Check CMP, lipid, and BNP today.  Obtain CXR.  Birdena Crandall CCM PharmD at bedside and able to start assist for [facility].  Recommend: - Reminded to call for an overnight weight gain of >2 pounds or a weekly weight weight of >5 pounds - not adding salt to his food and has been reading food labels. Reviewed the importance of keeping daily sodium intake to 2000mg  daily  - If worsening symptoms advised her to immediately go to ER. Return in 6 weeks to meet new PCP and follow-up on cardiology plan for patient.

## 2021-01-24 ENCOUNTER — Encounter: Payer: Self-pay | Admitting: Physician Assistant

## 2021-01-24 ENCOUNTER — Ambulatory Visit (INDEPENDENT_AMBULATORY_CARE_PROVIDER_SITE_OTHER): Payer: Medicare Other | Admitting: Physician Assistant

## 2021-01-24 VITALS — BP 100/76 | HR 101 | Ht 63.0 in | Wt 244.0 lb

## 2021-01-24 DIAGNOSIS — E785 Hyperlipidemia, unspecified: Secondary | ICD-10-CM | POA: Diagnosis not present

## 2021-01-24 DIAGNOSIS — J984 Other disorders of lung: Secondary | ICD-10-CM | POA: Diagnosis not present

## 2021-01-24 DIAGNOSIS — I10 Essential (primary) hypertension: Secondary | ICD-10-CM

## 2021-01-24 DIAGNOSIS — G4733 Obstructive sleep apnea (adult) (pediatric): Secondary | ICD-10-CM | POA: Diagnosis not present

## 2021-01-24 DIAGNOSIS — E1142 Type 2 diabetes mellitus with diabetic polyneuropathy: Secondary | ICD-10-CM

## 2021-01-24 DIAGNOSIS — I2510 Atherosclerotic heart disease of native coronary artery without angina pectoris: Secondary | ICD-10-CM

## 2021-01-24 DIAGNOSIS — I447 Left bundle-branch block, unspecified: Secondary | ICD-10-CM | POA: Diagnosis not present

## 2021-01-24 DIAGNOSIS — I5042 Chronic combined systolic (congestive) and diastolic (congestive) heart failure: Secondary | ICD-10-CM | POA: Diagnosis not present

## 2021-01-24 DIAGNOSIS — I493 Ventricular premature depolarization: Secondary | ICD-10-CM | POA: Diagnosis not present

## 2021-01-24 DIAGNOSIS — I428 Other cardiomyopathies: Secondary | ICD-10-CM | POA: Diagnosis not present

## 2021-01-24 LAB — COMPREHENSIVE METABOLIC PANEL
ALT: 116 IU/L — ABNORMAL HIGH (ref 0–32)
AST: 76 IU/L — ABNORMAL HIGH (ref 0–40)
Albumin/Globulin Ratio: 1.4 (ref 1.2–2.2)
Albumin: 4 g/dL (ref 3.8–4.8)
Alkaline Phosphatase: 105 IU/L (ref 44–121)
BUN/Creatinine Ratio: 20 (ref 12–28)
BUN: 17 mg/dL (ref 8–27)
Bilirubin Total: 0.5 mg/dL (ref 0.0–1.2)
CO2: 20 mmol/L (ref 20–29)
Calcium: 9.4 mg/dL (ref 8.7–10.3)
Chloride: 98 mmol/L (ref 96–106)
Creatinine, Ser: 0.86 mg/dL (ref 0.57–1.00)
Globulin, Total: 2.9 g/dL (ref 1.5–4.5)
Glucose: 356 mg/dL — ABNORMAL HIGH (ref 65–99)
Potassium: 4.9 mmol/L (ref 3.5–5.2)
Sodium: 137 mmol/L (ref 134–144)
Total Protein: 6.9 g/dL (ref 6.0–8.5)
eGFR: 74 mL/min/{1.73_m2} (ref >59)

## 2021-01-24 LAB — BAYER DCA HB A1C WAIVED: HB A1C (BAYER DCA - WAIVED): 8.6 % — ABNORMAL HIGH (ref <7.0)

## 2021-01-24 LAB — LIPID PANEL W/O CHOL/HDL RATIO
Cholesterol, Total: 79 mg/dL — ABNORMAL LOW (ref 100–199)
HDL: 31 mg/dL — ABNORMAL LOW (ref >39)
LDL Chol Calc (NIH): 15 mg/dL (ref 0–99)
Triglycerides: 215 mg/dL — ABNORMAL HIGH (ref 0–149)
VLDL Cholesterol Cal: 33 mg/dL (ref 5–40)

## 2021-01-24 MED ORDER — TORSEMIDE 20 MG PO TABS: 20.0000 mg | ORAL_TABLET | Freq: Every day | ORAL | 2 refills | Status: DC

## 2021-01-24 NOTE — Patient Instructions (Signed)
Medication Instructions:  Your physician has recommended you make the following change in your medication:   STOP Lasix (furosemide)  START Torsemide 20 mg daily. An Rx has been sent to your pharmacy  HOLD Potassium and Losartan until further instructed.  *If you need a refill on your cardiac medications before your next appointment, please call your pharmacy*   Lab Work: Bmp and Bnp today  If you have labs (blood work) drawn today and your tests are completely normal, you will receive your results only by: Marland Kitchen MyChart Message (if you have MyChart) OR . A paper copy in the mail If you have any lab test that is abnormal or we need to change your treatment, we will call you to review the results.   Testing/Procedures: None ordered   Follow-Up: At [facility], you and your health needs are our priority.  As part of our continuing mission to provide you with exceptional heart care, we have created designated Provider Care Teams.  These Care Teams include your primary Cardiologist (physician) and Advanced Practice Providers (APPs -  Physician Assistants and Nurse Practitioners) who all work together to provide you with the care you need, when you need it.  We recommend signing up for the patient portal called "MyChart".  Sign up information is provided on this After Visit Summary.  MyChart is used to connect with patients for Virtual Visits (Telemedicine).  Patients are able to view lab/test results, encounter notes, upcoming appointments, etc.  Non-urgent messages can be sent to your provider as well.   To learn more about what you can do with MyChart, go to [email or web address].    Your next appointment:   As planned on 01/27/21  The format for your next appointment:   In Person  Provider:   You may see Ida Rogue, MD or one of the following Advanced Practice Providers on your designated Care Team:    Murray Hodgkins, NP  Christell Faith, PA-C  Marrianne Mood,  PA-C  Cadence Seabrook, [place]  Laurann Montana, NP    Other Instructions N/A

## 2021-01-24 NOTE — Progress Notes (Signed)
Contacted via [place], but not sure she checks, please call her with following: Good evening Kim Buchanan, I read over your note today with cardiology. Labs have returned. Definitely restart [name] to help lower glucose levels. Cholesterol levels remain very well controlled. Kidney function and electrolytes stable, but liver function tests are elevated. I do request you return in 4 weeks for office visit to recheck these please and have visit. Please schedule. If ongoing elevation I would recommend we get an ultrasound of your liver to further assess. Avoid tylenol and alcohol use at this time. Any questions? Keep being awesome!! Thank you for allowing me to participate in your care. Kindest regards, Trevyon Swor

## 2021-01-24 NOTE — Progress Notes (Signed)
Office Visit    Patient Name: Kim Buchanan Date of Encounter: 01/24/2021  PCP:  [name], NP   [place]  Cardiologist:  Ida Rogue, MD  Advanced Practice Provider:  No care team member to display Electrophysiologist:  [name], MD   Chief Complaint    Chief Complaint  Patient presents with  . Follow-up    Patient reports SOB and edema-took 66m of Lasix yesterday per PCP which patient states was helpful    70year old female with history of nonobstructive CAD, HFrEF secondary to nonischemic cardiomyopathy, COPD, previous tobacco use quitting in 2020, restrictive lung disease, DM2, hyperlipidemia, chronic dyspnea, obesity, untreated sleep apnea, GERD, anxiety, and depression, and here today for follow-up with report of shortness of breath and edema after recently increased diuresis .  Past Medical History    Past Medical History:  Diagnosis Date  . Anxiety   . Arthritis    lower back  . COPD (chronic obstructive pulmonary disease) ([facility]   . Depression   . Diabetes mellitus without complication ([facility]   . GERD (gastroesophageal reflux disease)   . Hyperlipidemia   . Morbid obesity ([facility]   . NICM (nonischemic cardiomyopathy) ([facility]    a. 03/2019 Echo: Nl LVEF; b. 09/04/2019 Echo: EF 25-30%, Gr1 DD, glob HK, nl RV fxn; c. 09/11/2019 Cath: nonobs LAD/RCA dzs. EF 50-55%. RA 10, RV 29/8(11), PA 27/16(20), PCWP 12. CO/CI 5.04/2.51.  .Marland KitchenNonobstructive CAD (coronary artery disease)    a. 03/2019 MV (Humphrey Rolls: EF 63%, ant ischemia; b. 08/2019 Cath: LM nl, LAD 321mRCA 70p (iFR nl @ 0.97), 50p. EF 50-55%-->Med Rx.  . Osteoporosis   . Panic attack   . PTSD (post-traumatic stress disorder)   . Rectal fistula   . Sleep apnea    a. Prev wore CPAP but lost insurance and hasn't been using since (now has insurance).  . Wears dentures    full upper   Past Surgical History:  Procedure Laterality Date  . ABDOMINAL HYSTERECTOMY    . CARDIAC  CATHETERIZATION    . COLONOSCOPY  2014  . COLONOSCOPY WITH PROPOFOL N/A 10/01/2016   Procedure: COLONOSCOPY WITH PROPOFOL;  Surgeon: DaLucilla LameMD;  Location: [facility] Service: Endoscopy;  Laterality: N/A;  . CORONARY PRESSURE WIRE/FFR WITH 3D MAPPING N/A 09/11/2019   Procedure: Coronary Pressure Wire/FFR w/3D Mapping;  Surgeon: EnNelva BushMD;  [facility];  Service: Cardiovascular;  Laterality: N/A;  . CYSTOCELE REPAIR N/A 12/17/2016   Procedure: ANTERIOR REPAIR (CYSTOCELE);  Surgeon: MaBrayton MarsMD;  Location: [facility];  Service: Gynecology;  Laterality: N/A;  . ESOPHAGOGASTRODUODENOSCOPY (EGD) WITH PROPOFOL N/A 08/11/2019   Procedure: ESOPHAGOGASTRODUODENOSCOPY (EGD) WITH PROPOFOL;  Surgeon: WoLucilla LameMD;  Location: [facility];  Service: Endoscopy;  Laterality: N/A;  . POLYPECTOMY  10/01/2016   Procedure: POLYPECTOMY;  Surgeon: DaLucilla LameMD;  [place]: [facility] Service: Endoscopy;;  . RECTAL SURGERY  06/28/2015   Rectal prolapse, laparoscopic rectopexy the coldCharmont, MD; [facility]  . RIGHT/LEFT HEART CATH AND CORONARY ANGIOGRAPHY Bilateral 09/11/2019   Procedure: RIGHT/LEFT HEART CATH AND CORONARY ANGIOGRAPHY;  Surgeon: GoMinna MerrittsMD;  [facility];  Service: Cardiovascular;  Laterality: Bilateral;  . TUBAL LIGATION    . VAGINAL HYSTERECTOMY Bilateral 12/17/2016   Procedure: HYSTERECTOMY VAGINAL WITH BILATERAL SALPINGO OOPHERECTOMY;  Surgeon: MaBrayton MarsMD;  Location: [facility];  Service: Gynecology;  Laterality: Bilateral;  Allergies  Allergies  Allergen Reactions  . Lisinopril Cough    History of Present Illness    Kim Buchanan is a 70 y.o. female with PMH as above.  She was previously evaluated by Dr. Humphrey Rolls and underwent 03/2019 echo with normal LV SF.  Stress testing showed anterior ischemia.  Coronary CTA was recommended but not obtained.  She subsequently evaluated by  pulmonology.  She is then referred to Dr. Rockey Situ 07/2019.  Repeat echo 08/2019 showed EF 25 to 30%, global hypokinesis, G1 DD.    She underwent diagnostic LHC 08/2019 with 70% stenosis of the proximal RCA.  IFR was normal at 0.97.  Medical therapy recommended.  LV gram showed EF 50 to 55%.  Right heart cath with normal filling pressures.    She was seen in office 09/2019 and doing well from a cardiac perspective.  She had resolution of cough following discontinuation of losartan.  She was on low-dose Coreg.  She was referred to cardiac rehab.    She was last seen in office by Christell Faith, PA-C 09/07/2020 and 11/10/2020.  At that time, she noted improvement in her dyspnea on Lasix 20 mg daily.  She did not tolerate Entresto due to cough and self discontinued this medication.  She had stable two-pillow orthopnea.  She was drinking more than 2 L of fluid per day but not adding salt to her food.  Since that time, she has been able to clear [place] with her insurance company and started this medication.  She was seen by Dr. Quentin Ore 09/2020 for further evaluation of nonischemic cardiomyopathy and left bundle branch block.  She was placed on Entresto with recommendation for follow-up echo in 3 months.  If her cardiomyopathy persisted, CRT-D was recommended.  She was referred to the advanced heart failure clinic.  She was last seen in office 10/2020.  She reported fatigue and dyspnea, though unchanged from previous visits.  She did not tolerate Entresto secondary to cough.  She is not able to afford [place].  She is working with an IT consultant to see if she could get this prescription cheaper.  Stable two-pillow orthopnea noted.  Weight stable.  Follow-up echo recommended 12/2020.  She was seen by EP 3/16 and noted to be volume overloaded.  It was noted that further titration of medical therapy was recommended to attempt first to improve EF above 35%.  Lasix increased to 40 mg daily with potassium  supplementation.  Today, 01/24/2021, she returns to clinic and notes that she is not feeling well.  On further questioning, she reports that she takes her increased dose Lasix 40 mg as needed, rather than on a daily basis.  She reports that she has always taken her Lasix this way.  Yesterday (01/23/2021), she reported worsening of her symptoms.  She had not taken her Lasix for several days leading up to 01/23/2021.  She presented to her PCP yesterday, due to worsening shortness of breath, dyspnea, fatigue, nausea, abdominal distention, lower extremity edema, and diaphoresis.  She was seen by her PCP with recommendation to take Lasix 60 mg x 1.  Chest x-ray was also obtained and showed findings concerning for bronchitis and aortic atherosclerosis.  We reviewed this finding today.  No recent fevers, chills, or cough.  Following Lasix 60 mg x 1, she noted slight improvement in her symptoms.  Today, however, she has not taken any Lasix, as she did not want to urinate during the visit.  She reports continued symptoms from  that of yesterday (01/23/2021), though slightly improved since Lasix 60 mg x 1.  On exam, significant work of breathing noted with shortness of breath at rest reported, as well as diaphoresis and an episode of nausea.  Given her LHC in 2020 showed 70% stenosis of the RCA, attempt was made to clarify etiology of nausea.  She denies any association of nausea with food.  She is taking her Protonix 40 mg daily as directed.  No signs or symptoms of bleeding.  Nausea usually occurs both with exertion and at rest with unclear triggers and alleviates within 10 minutes.  She reports that nausea, diaphoresis, and shortness of breath usually occur together. She is unable to remember if she felt nausea before her 08/2019 diagnostic catheterization.  EKG requested after today's visit and before patient left the office and without acute ST/T changes- reviewed by DOD.  She denies any racing heart rate or palpitations,  though she does note some dizziness.  She is not checking her BP at home with today's pressure soft at 100/76.  No syncope or falls.  She reports compliance with daily weights, though she has not noticed any decrease in weight, despite increased diuresis.  She reports stable two-pillow orthopnea.  CHF education provided, as she continues to drink more than 2 L daily.  She reports she is thirsty otherwise with recommendation for sugar-free candy/sugar-free popsicles over fluid to prevent dry mouth and patient understanding.  She is trying to cut back on salt.  Home Medications    Current Outpatient Medications on File Prior to Visit  Medication Sig Dispense Refill  . aspirin 81 MG tablet Take 81 mg by mouth daily.     Marland Kitchen atorvastatin (LIPITOR) 10 MG tablet Take 1 tablet (10 mg total) by mouth at bedtime. 90 tablet 3  . blood glucose meter kit and supplies Dispense based on patient and insurance preference. Check fasting blood sugar once daily. (FOR ICD E11.69). 1 each 0  . dapagliflozin propanediol (FARXIGA) 10 MG TABS tablet Take 1 tablet (10 mg total) by mouth daily before breakfast. 30 tablet 2  . furosemide (LASIX) 40 MG tablet Take 1 tablet (40 mg total) by mouth daily. 90 tablet 3  . gabapentin (NEURONTIN) 300 MG capsule Take 1 capsule (300 mg total) by mouth 3 (three) times daily. 90 capsule 0  . isosorbide mononitrate (IMDUR) 30 MG 24 hr tablet TAKE 1 TABLET BY MOUTH  DAILY 90 tablet 1  . Lancets (ONETOUCH DELICA PLUS [id number]) MISC daily.     Marland Kitchen losartan (COZAAR) 25 MG tablet Take 1 tablet (25 mg total) by mouth daily. 90 tablet 3  . metFORMIN (GLUCOPHAGE) 1000 MG tablet Take 1 tablet (1,000 mg total) by mouth 2 (two) times daily with a meal. 180 tablet 4  . metoprolol succinate (TOPROL-XL) 25 MG 24 hr tablet TAKE 1 AND 1/2 TABLETS BY  MOUTH DAILY 135 tablet 2  . ONETOUCH ULTRA test strip 1 each daily.    . pantoprazole (PROTONIX) 40 MG tablet Take 1 tablet (40 mg total) by mouth daily. 90  tablet 4  . polyethylene glycol powder (GLYCOLAX/MIRALAX) 17 GM/SCOOP powder Take 1-2 doses daily as needed for constipation/to prevent constipation    . potassium chloride (KLOR-CON) 10 MEQ tablet Take 1 tablet (10 mEq total) by mouth daily. 90 tablet 3  . QUEtiapine (SEROQUEL) 100 MG tablet TAKE 1 TABLET BY MOUTH AT  BEDTIME 90 tablet 4  . spironolactone (ALDACTONE) 25 MG tablet Take 0.5 tablets (12.5 mg  total) by mouth daily. 30 tablet 5   No current facility-administered medications on file prior to visit.    Review of Systems    She reports stable 2 pillow orthopnea, intermittent dizziness.  She has had worsening shortness of breath, dyspnea, abdominal distention, nausea, edema, fatigue as outlined above and since holding her diuresis.  She denies chest pain, palpitations, pnd, v, syncope,  or early satiety.  She has not noticed a change in her weight, despite increased diuresis.   All other systems reviewed and are otherwise negative except as noted above.  Physical Exam    VS:  BP 100/76 (BP Location: Left Arm, Patient Position: Sitting, Cuff Size: Large)   Pulse 98   Ht '5\' 3"'  (1.6 m)   Wt 244 lb (110.7 kg)   SpO2 95%   BMI 43.22 kg/m  , BMI Body mass index is 43.22 kg/m. GEN: Obese female, no acute distress.  Joined by her husband.   HEENT: normal. Neck: Supple, no carotid bruits, or masses.  JVD difficult to assess due to body habitus Cardiac: Tachycardic but regular, no murmurs, rubs, or gallops. No clubbing, cyanosis.no pitting edema.  Radials/DP/PT 2+ and equal bilaterally.  Respiratory: Work of breathing noted.  Accessory muscle use noted.  Bilaterally reduced breath in distant breath sounds. GI: Obese, nontender, BS + x 4. MS: no deformity or atrophy. Skin: warm, no rash.  Diaphoresis noted. Neuro:  Strength and sensation are intact. Psych: Normal affect.  Accessory Clinical Findings    ECG personally reviewed by me today -sinus tachycardia, 101 bpm, LVH with  repolarization abnormalities, left bundle branch block (none), previous anteroseptal infarct, prolonged QTC at 523 ms, prolonged PR interval at 176 ms, reviewed same day by DOD (Dr. Rockey Situ)- no acute changes.  VITALS Reviewed today   Temp Readings from Last 3 Encounters:  01/23/21 (!) 97.4 F (36.3 C) (Oral)  10/17/20 (!) 94.5 F (34.7 C)  07/12/20 (!) 96.9 F (36.1 C) (Temporal)   BP Readings from Last 3 Encounters:  01/24/21 100/76  01/23/21 126/83  01/04/21 126/80   Pulse Readings from Last 3 Encounters:  01/24/21 98  01/23/21 94  01/04/21 98    Wt Readings from Last 3 Encounters:  01/24/21 244 lb (110.7 kg)  01/23/21 248 lb 12.8 oz (112.9 kg)  01/04/21 245 lb 9.6 oz (111.4 kg)     LABS  reviewed today   Lab Results  Component Value Date   WBC 7.3 11/30/2019   HGB 13.2 11/30/2019   HCT 40.2 11/30/2019   MCV 83 11/30/2019   PLT 306 11/30/2019   Lab Results  Component Value Date   CREATININE 0.86 01/23/2021   BUN 17 01/23/2021   NA 137 01/23/2021   K 4.9 01/23/2021   CL 98 01/23/2021   CO2 20 01/23/2021   Lab Results  Component Value Date   ALT 116 (H) 01/23/2021   AST 76 (H) 01/23/2021   ALKPHOS 105 01/23/2021   BILITOT 0.5 01/23/2021   Lab Results  Component Value Date   CHOL 79 (L) 01/23/2021   HDL 31 (L) 01/23/2021   LDLCALC 15 01/23/2021   TRIG 215 (H) 01/23/2021   CHOLHDL 1.5 11/30/2019    Lab Results  Component Value Date   HGBA1C 8.6 (H) 01/23/2021   Lab Results  Component Value Date   TSH 2.440 10/17/2020     STUDIES/PROCEDURES reviewed today    Coronary CTA 06/30/2020: Aorta: Normal size. Mild aortic atherosclerosis. No dissection.  Aortic Valve:  Trileaflet. No calcifications.  Coronary Arteries: Normal coronary origin. Right dominance.  RCA is a large dominant artery that gives rise to PDA and PLVB. There is mild (25-49%) soft plaque stenosis in the proximal to mid vessel followed by mild (25-49%) mixed plaque  stenosis.  Left main is a large artery that gives rise to LAD and LCX arteries.  LAD is a large vessel that gives rise to medium size D1 and D2 and small D3; there is long, soft plaque stenosis in the mid vessel mild (25-49%) followed by moderate (50-69%).  LCX is a non-dominant artery that gives rise to one large OM1 branch. There is no plaque.  Other findings:  Normal pulmonary vein drainage into the left atrium.  Normal let atrial appendage without a thrombus.  Normal size of the pulmonary artery.  IMPRESSION: 1. Coronary calcium score of 39.3. This was 47 percentile for age and sex matched control.  2. Normal coronary origin with right dominance.  3. Moderate (50-69%) soft plaque stenosis in the mid LAD; mild (25-49%) soft and mixed plaque stenoses in the proximal to mid RCA.  4. Study will be sent for FFR. __________  FFR 07/01/2020: 1. Left Main: findings 0.99, 0.99  2. LAD: findings 0.99, 0.97 0.92  3. OM: Findings 0.98, 0.88 0.84  4. RCA: findings 0.97, 0.81 0.84  IMPRESSION: FFR not suggestive of significant obstructive disease. __________  Coronary CTA 06/09/2020: Unable to be performed secondary to heart rate -Calcium score 2.67 which places the patient in the 49th percentile for age and sex matched control __________   2D echo 05/30/2020: 1. Left ventricular ejection fraction, by estimation, is 30 to 35%. The  left ventricle has moderately decreased function. Left ventricular  endocardial border not optimally defined to evaluate regional wall motion.  Left ventricular diastolic parameters  are consistent with Grade II diastolic dysfunction (pseudonormalization).  2. Right ventricular systolic function was not well visualized. The right  ventricular size is normal. Tricuspid regurgitation signal is inadequate  for assessing PA pressure.  3. The mitral valve is normal in structure. Mild mitral valve  regurgitation. No evidence of  mitral stenosis.  4. The aortic valve was not well visualized. Aortic valve regurgitation  is not visualized. No aortic stenosis is present.  5. The inferior vena cava is normal in size with greater than 50%  respiratory variability, suggesting right atrial pressure of 3 mmHg.  6. Challenging image quality. __________  2D echo 08/2019: 1. Left ventricular ejection fraction, by visual estimation, is 25 to  30%. The left ventricle has moderate to severely decreased function. There  is no left ventricular hypertrophy.  2. Left ventricular diastolic parameters are consistent with Grade I  diastolic dysfunction (impaired relaxation).  3. The left ventricle demonstrates global hypokinesis.  4. Global right ventricle has normal systolic function.The right  ventricular size is normal. No increase in right ventricular wall  thickness.  5. Left atrial size was normal.  6. TR signal is inadequate for assessing pulmonary artery systolic  Pressure. __________  [facility] 08/2019: Coronary angiography:  Coronary dominance: Right  Left mainstem: Large vessel that bifurcates into the LAD and left circumflex, no significant disease noted  Left anterior descending (LAD): Large vessel that extends to the apical region, diagonal branch 2 of moderate size, mild proximal LAD disease estimated at 30%  Left circumflex (LCx): Large vessel with OM branch 2, no significant disease noted  Right coronary artery (RCA): Right dominant vessel with PL and PDA, 70% proximal RCA  disease followed by sequential 50% lesion proximal to mid vessel  Left ventriculography: Left ventricular systolic function is normal, LVEF is estimated at 50 to 55%, there is no significant mitral regurgitation , no significant aortic valve stenosis  Right heart pressures RA 10 RV 29/8, 11 PA 27/16 mean 20 Wedge 12 Cardiac output 5.04 Cardiac index 2.51  Final Conclusions:  Single-vessel disease RCA Discussed with  interventional cardiology, they will perform FFR to determine if intervention needed Right heart pressures are normal   Recommendations:  We will defer coronary intervention to interventional cardiology Needs weight loss, management of COPD Management of hyperlipidemia __________  iFR 08/2019:  Conclusions: 1. Single vessel coronary with sequential 70% and 50% proximal and mid RCA stenoses that are not hemodynamically significant (iFR = 0.97). See Dr. Donivan Scull diagnostic catheterization report for further details of right/left heart catheterization.  Recommendations: 1. Medical therapy and risk factor modification to prevent progression of disease.   Assessment & Plan    Heart failure with reduced ejection fraction (EF 30 to 35%) Nonischemic cardiomyopathy  Known left bundle branch block --NYHA class III-IV symptoms. Seen by EP with recommendation for optimization of medical management before consideration of CRT and increased dose lasix/ARB. Reports worsening symptoms of SOB/DOE since that time, but also with consideration of inconsistent daily lasix.  Per PCP, took Lasix 60 mg x 1 with slight improvement in sx.  Today, she reports she feels dizzy, nauseated, diaphoretic, and short of breath.  She has not yet taken her Lasix with BP 100/76.  CXR obtained per PCP and thought consistent with bronchitis. Work of breathing on exam today disproportionate to volume status on exam.  Suspect multifactorial etiology of her current symptoms.  Low BP likely contributing to sx, as well as pulmonary findings. CXR with possible bronchitis and no edema. 11/2020 CT scan with pulmonary nodules noted, as well as known COPD/BMI/OSA/possible OHS contributing.  Given her nausea /dizziness / diaphoresis during today's visit,  EKG obtained as well and without acute ST/T abnormality.    Hold losartan 25 mg daily for now and until RTC.    Started torsemide 20 mg daily.    Given her report of abdominal  bloating, and per patient report of feeling Lasix is currently ineffective, will change Lasix to torsemide.    Discontinued Lasix 40 mg daily.    Hold potassium supplementation for now, given recent labs with high K.    Recheck a BNP & BMET today with further recommendations at that time, if indicated.    No Entresto secondary to intolerance/cough.    Continue Farxiga, Toprol-XL, and spironolactone at current doses.    Given her severity of symptoms, recommendation was for close follow-up as already scheduled with Christell Faith, PA-C on 01/27/2021 to reassess volume status, BP, and symptoms.   Nonobstructive CAD -- Reported an episode of nausea /dizziness / diaphoresis with worsening SOB/DOE during today's visit and subsequent resolution of nausea 10 minutes later.  Given RCA dz noted on last cath and LAD/RCA plaque on 06/2020 coronary CT, EKG obtained and without acute ST/T changes, reviewed with DOD same day.  2020 LHC with moderate RCA disease but normal IFR.  Subsequent coronary CTA 06/2020 with nonobstructive disease. Recommended she closely follow her symptoms with close follow-up recommended later this week.  Continue aggressive risk factor modification.   Continue ASA, atorvastatin, Toprol, and Imdur. She will closely monitor her sx as instructed.  If euvolemic at RTC but worsening sx, consider repeat right and left heart  catheterization.  Will defer for now and after discussion with DOD, Dr. Rockey Situ.  PVCs --Denies any associated symptoms.  No PVCs on EKG.  Continue current Toprol-XL.  HLD --Continue current atorvastatin.  COPD/restrictive lung disease --Consider as contributing to her current symptoms.  Followed by pulmonology with recommendation to continue follow-up as scheduled.  Untreated sleep apnea --History of sleep apnea and previously on CPAP, though lost her insurance and no longer has access to a CPAP machine.  Agree with previously noted suspicion that obesity, OSA, and  possible OHS are contributing to her overall presentation.  Recommend continue to follow with pulmonology and restart CPAP if able to obtain this machine.  Discussed cardiovascular effects of untreated sleep apnea.  Morbid obesity with deconditioning --Lifestyle changes recommended, including weight loss and increased activity.  Reviewed diet with recommendation for low-salt and heart healthy changes.  Medication changes: Stop Lasix 40 mg daily.  Start torsemide 20 mg daily.  Hold losartan 25 mg daily.  Hold potassium supplementation for now. Labs ordered: BMET, BNP Studies / Imaging ordered: None Future considerations: Repeat right and left heart catheterization Disposition: RTC as previously scheduled for/8/22  Arvil Chaco, PA-C 01/24/2021

## 2021-01-24 NOTE — Progress Notes (Signed)
Contacted via [place], but not sure she checks, please call her with following: Good evening [name], I read over your note today with cardiology.  Labs have returned.  Definitely restart [name] to help lower glucose levels.  Cholesterol levels remain very well controlled.  Kidney function and electrolytes stable, but liver function tests are elevated.  I do request you return in 4 weeks for office visit to recheck these please and have visit.  Please schedule.  If ongoing elevation I would recommend we get an ultrasound of your liver to further assess.  Avoid tylenol and alcohol use at this time.  Any questions? Keep being awesome!!  Thank you for allowing me to participate in your care. Kindest regards, Kiaraliz Rafuse

## 2021-01-24 NOTE — Progress Notes (Signed)
Contacted via [place] afternoon [place], your imaging returned and does notice some possible acute bronchitis changes, although I continue to be concerned for fluid as well.  I would recommend continue to follow with cardiology, I see they made medication changes today.  If worsening symptoms let me know.

## 2021-01-25 LAB — BASIC METABOLIC PANEL
BUN/Creatinine Ratio: 23 (ref 12–28)
BUN: 19 mg/dL (ref 8–27)
CO2: 22 mmol/L (ref 20–29)
Calcium: 10.1 mg/dL (ref 8.7–10.3)
Chloride: 98 mmol/L (ref 96–106)
Creatinine, Ser: 0.81 mg/dL (ref 0.57–1.00)
Glucose: 234 mg/dL — ABNORMAL HIGH (ref 65–99)
Potassium: 4.4 mmol/L (ref 3.5–5.2)
Sodium: 142 mmol/L (ref 134–144)
eGFR: 80 mL/min/{1.73_m2} (ref >59)

## 2021-01-25 LAB — BRAIN NATRIURETIC PEPTIDE: BNP: 67 pg/mL (ref 0.0–100.0)

## 2021-01-26 ENCOUNTER — Other Ambulatory Visit: Payer: Self-pay | Admitting: Family Medicine

## 2021-01-26 NOTE — Telephone Encounter (Signed)
   Notes to clinic:  this script has expired  Review for continued use and refill    Requested Prescriptions  Pending Prescriptions Disp Refills   gabapentin (NEURONTIN) 300 MG capsule [Pharmacy Med Name: GABAPENTIN 300 MG CAPSULE] 90 capsule 0    Sig: Take 1 capsule (300 mg total) by mouth 3 (three) times daily.      Neurology: Anticonvulsants - gabapentin Passed - 01/26/2021  2:36 PM      Passed - Valid encounter within last 12 months    Recent Outpatient Visits           3 days ago HFrEF (heart failure with reduced ejection fraction) ([place])   [place], Jolene T, NP   3 months ago Type 2 diabetes mellitus with peripheral neuropathy ([place])   [place], Jolene T, NP   7 months ago Type 2 diabetes mellitus with peripheral neuropathy ([place])   [place], [place] T, NP   9 months ago Insomnia, unspecified type   [place], [place]   10 months ago Chronic left-sided low back pain without sciatica   [facility], Lilia Argue, [place]       Future Appointments             Tomorrow [name], Areta Haber, PA-C [facility], [place]   In 1 month [name], NP [facility], [place]   In 2 months Quentin Ore, Hilton Cork, MD [facility]   In 10 months  [facility], [place]

## 2021-01-26 NOTE — Progress Notes (Signed)
Cardiology Office Note    Date:  01/27/2021   ID:  KJERSTEN ORMISTON, DOB 03-20-1951, MRN [id number]  PCP:  [name], NP  Cardiologist:  Ida Rogue, MD  Electrophysiologist:  [name], MD   Chief Complaint: Follow-up  History of Present Illness:   Kim Buchanan is a 70 y.o. female with history of nonobstructive CAD, HFrEF secondary toNICM,COPD secondary to tobacco use quitting in 11/2018,restrictive lung disease,DM2, HLD, chronic dyspnea, obesity, untreated sleep apnea, GERD, anxiety, and depression who presents for follow-up of her cardiomyopathy.  She was previously followed by Dr. Jenita Seashore underwent echo in 03/2019 which showed normal LV systolic function. Stress testing was undertaken in the setting of ongoing dyspnea which showed anterior ischemia. Following this, there was recommendation for coronary CTA though the patient did not follow-up and was subsequently evaluated by pulmonology. In this setting,she was referred to Dr. Rockey Situ who saw the patient in 07/2019 for follow-up of the above prior cardiac testing. Records were not available at that time, therefore, sheunderwent repeat echo in 08/2019,which revealed an EF of 25 to 30%, global hypokinesis, and grade 1 diastolic dysfunction. There was no significant valvular disease. Given her new cardiomyopathy, she underwent diagnostic cardiac cath in 08/2019 which revealed a 70% stenosis in the proximal RCA. iFR was normal at 0.97. Medical therapy was recommended. Of note, LV gram was performed and showed an EF of 50 to 55%. Right heart cath showed normal filling pressures. She was seen in the office in 09/2019 and was doing well from a cardiac perspective. She was noted to have resolution of cough following discontinuation of losartan. She was started on low-dose carvedilol and referred to cardiopulmonary rehab.   She was seen in the office on 05/19/2020,and noted stable symptoms over the past  couple of years. She followed up with pulmonology with lung disease felt to be less likely the etiology of her symptoms, with mild restrictive physiology noted on PFTs. She was unable to participate in cardiopulmonary rehab secondary to dyspnea. It was noted she did not tolerate Coreg secondary to cough and self discontinued it. She underwent echo, per pulmonology on 05/30/2020, which showed an EF of 30-35%, Gr2DD, normal RV cavity size, and mild MR.She was scheduled for coronary CTA on 06/09/2020, whichwas unable to be performed secondary to heart rates.She was able to undergo calcium scoring with a calcium score of 2.67 which placed the patient in the 49th percentile for age and sex matched control.She was seen in follow up on 8/20/2021and continued to feel about the same with ongoing exertional dyspnea. She subsequently underwent coronary CTA on 06/30/2020 which showed nonobstructive CAD as outlined below. In this setting, she was started on losartan to further optimize her medical therapy. She was seen by pulmonology on 07/12/2020,in follow up with noted minimal obstructive, if any, and mild restrictive lung disease. She was seen in the office on 07/20/2020 and continued to feel about the same with stable exertional dyspnea that has been present for the past several years. She did not notice any changes in her dyspnea following the addition of inhaler therapy by pulmonology. With continued PVCs, Toprol-XL was titrated to 37.5 mg daily. She was also started on spironolactone 12.5 mg.She was seen in the office on 08/22/2020 noting a 3-day history of increased chest congestion, shortness of breath, and nonproductive cough. Her weight was stable. Lasix 20 mg daily for 5 days was added.  I last saw her on 09/07/2020 at which time she noted an  improvement in her dyspnea while on scheduled low-dose Lasix 20 mg daily with a return in her dyspnea and exertional fatigue while being off this medication.  In  this setting, she was placed on scheduled low-dose Lasix 20 mg daily.  She was seen by Dr. Quentin Ore on 10/05/2020 for further evaluation of her NICM and left bundle branch block.  She was placed on Entresto with recommendation to follow-up echo in approximately 3 months.  If her cardiomyopathy persisted CRT-D was recommended.  She was also referred to the advanced heart failure clinic in [place].  She was seen in 10/2020 for follow-up and it was noted she did not tolerate Entresto secondary to cough and self discontinued this medication.  She had concerns about the affordability of [name], though with patient assistance paperwork this was subsequently approved.  She was mildly volume up and was advised to increase Lasix to 40 mg daily for 3 days followed by 20 mg daily thereafter.  She was placed back on losartan.  She was seen by EP in 12/2020 with continued volume overload and shortness of breath.  Echo in 12/2020 showed a persistent cardiomyopathy with an EF of 30 to 35%, global hypokinesis, indeterminate LV diastolic function parameters, normal RV systolic function and ventricular cavity size, and mild mitral valve regurgitation.  Her weight was up 4 pounds when compared to her visit in 10/2020.  Her Lasix was increased to 40 mg daily and losartan was titrated to 25 mg daily.  She was most recently seen in the office on 01/24/2021 by Marrianne Mood, PA-C with a weight that was down 1 pound when compared to her visit in 12/2020.  She continued to note shortness of breath and reported having recently been seen by her PCP with chest x-ray without evidence of pleural effusions or vascular congestion.  She was advised at PCP's office to increase Lasix to 60 mg briefly with noted slight improvement in symptoms after 1 dose.  At her visit with cardiology, she indicated she had not taken her Lasix for several days and was taking this on a as needed basis rather than the recommended 40 mg daily.  BP was soft at  100/76.  Her losartan was held on 4/5 secondary to soft BP, and she was placed on torsemide 20 mg daily in place of Lasix 40 mg daily.  She was advised to have close follow-up at her previously scheduled visit with me today.  She comes in accompanied by her husband today and does feel slightly better than when she was seen earlier this week.  Her weight is down 3 pounds when compared to her visit just 3 days prior.  She has been adherent to torsemide 20 mg daily.  She has also cut back on her fluid consumption and is now drinking approximately 2 L, whereas previously she was drinking more than this.  She does continue to monitor her salt.  She notes her abdomen feels less distended and her lower extremity swelling is improved.  She has stable two-pillow orthopnea.  No chest pain, palpitations, dizziness, presyncope, or syncope.   Labs independently reviewed: 01/2021 - BNP 67, BUN 19, serum creatinine 0.81, potassium 4.4, TC 79, TG 215, HDL 31, LDL 15, albumin 4.0, AST 76, ALT 116, A1c 8.6 12/2020 - magnesium 2.1 09/2020 - TSH normal 11/2019 - Hgb 13.2, PLT 306  Past Medical History:  Diagnosis Date  . Anxiety   . Arthritis    lower back  . COPD (chronic obstructive pulmonary  disease) ([place])   . Depression   . Diabetes mellitus without complication ([place])   . GERD (gastroesophageal reflux disease)   . Hyperlipidemia   . Morbid obesity ([place])   . NICM (nonischemic cardiomyopathy) ([place])    a. 03/2019 Echo: Nl LVEF; b. 09/04/2019 Echo: EF 25-30%, Gr1 DD, glob HK, nl RV fxn; c. 09/11/2019 Cath: nonobs LAD/RCA dzs. EF 50-55%. RA 10, RV 29/8(11), PA 27/16(20), PCWP 12. CO/CI 5.04/2.51.  Marland Kitchen Nonobstructive CAD (coronary artery disease)    a. 03/2019 [facility]): EF 63%, ant ischemia; b. 08/2019 Cath: LM nl, LAD 89m RCA 70p (iFR nl @ 0.97), 50p. EF 50-55%-->Med Rx.  . Osteoporosis   . Panic attack   . PTSD (post-traumatic stress disorder)   . Rectal fistula   . Sleep apnea    a. Prev wore CPAP but lost  insurance and hasn't been using since (now has insurance).  . Wears dentures    full upper    Past Surgical History:  Procedure Laterality Date  . ABDOMINAL HYSTERECTOMY    . CARDIAC CATHETERIZATION    . COLONOSCOPY  2014  . COLONOSCOPY WITH PROPOFOL N/A 10/01/2016   Procedure: COLONOSCOPY WITH PROPOFOL;  Surgeon: DLucilla Lame MD;  [facility]  Service: Endoscopy;  Laterality: N/A;  . CORONARY PRESSURE WIRE/FFR WITH 3D MAPPING N/A 09/11/2019   Procedure: Coronary Pressure Wire/FFR w/3D Mapping;  Surgeon: ENelva Bush MD;  [facility];  Service: Cardiovascular;  Laterality: N/A;  . CYSTOCELE REPAIR N/A 12/17/2016   Procedure: ANTERIOR REPAIR (CYSTOCELE);  Surgeon: MBrayton Mars MD;  Location: [facility];  Service: Gynecology;  Laterality: N/A;  . ESOPHAGOGASTRODUODENOSCOPY (EGD) WITH PROPOFOL N/A 08/11/2019   Procedure: ESOPHAGOGASTRODUODENOSCOPY (EGD) WITH PROPOFOL;  Surgeon: WLucilla Lame MD;  Location: [facility];  Service: Endoscopy;  Laterality: N/A;  . POLYPECTOMY  10/01/2016   Procedure: POLYPECTOMY;  Surgeon: DLucilla Lame MD;  [facility]  Service: Endoscopy;;  . RECTAL SURGERY  06/28/2015   Rectal prolapse, laparoscopic rectopexy the coldCharmont, MD; [facility]  . RIGHT/LEFT HEART CATH AND CORONARY ANGIOGRAPHY Bilateral 09/11/2019   Procedure: RIGHT/LEFT HEART CATH AND CORONARY ANGIOGRAPHY;  Surgeon: GMinna Merritts MD;  [facility];  Service: Cardiovascular;  Laterality: Bilateral;  . TUBAL LIGATION    . VAGINAL HYSTERECTOMY Bilateral 12/17/2016   Procedure: HYSTERECTOMY VAGINAL WITH BILATERAL SALPINGO OOPHERECTOMY;  Surgeon: MBrayton Mars MD;  Location: [facility];  Service: Gynecology;  Laterality: Bilateral;    Current Medications: Current Meds  Medication Sig  . aspirin 81 MG tablet Take 81 mg by mouth daily.   .Marland Kitchenatorvastatin (LIPITOR) 10 MG tablet Take 1 tablet (10 mg total) by  mouth at bedtime.  . blood glucose meter kit and supplies Dispense based on patient and insurance preference. Check fasting blood sugar once daily. (FOR ICD E11.69).  . dapagliflozin propanediol (FARXIGA) 10 MG TABS tablet Take 1 tablet (10 mg total) by mouth daily before breakfast.  . gabapentin (NEURONTIN) 300 MG capsule Take 1 capsule (300 mg total) by mouth 3 (three) times daily.  . isosorbide mononitrate (IMDUR) 30 MG 24 hr tablet TAKE 1 TABLET BY MOUTH  DAILY  . Lancets (ONETOUCH DELICA PLUS LCLEXNT70Y MISC daily.   . metFORMIN (GLUCOPHAGE) 1000 MG tablet Take 1 tablet (1,000 mg total) by mouth 2 (two) times daily with a meal.  . metoprolol succinate (TOPROL-XL) 25 MG 24 hr tablet TAKE 1 AND 1/2 TABLETS BY  MOUTH DAILY  . ONETOUCH  ULTRA test strip 1 each daily.  . pantoprazole (PROTONIX) 40 MG tablet Take 1 tablet (40 mg total) by mouth daily.  . polyethylene glycol powder (GLYCOLAX/MIRALAX) 17 GM/SCOOP powder Take 1-2 doses daily as needed for constipation/to prevent constipation  . QUEtiapine (SEROQUEL) 100 MG tablet TAKE 1 TABLET BY MOUTH AT  BEDTIME  . spironolactone (ALDACTONE) 25 MG tablet Take 0.5 tablets (12.5 mg total) by mouth daily.  . [DISCONTINUED] torsemide (DEMADEX) 20 MG tablet Take 1 tablet (20 mg total) by mouth daily.    Allergies:   Lisinopril   Social History   Socioeconomic History  . Marital status: Married    Spouse name: Paediatric nurse  . Number of children: 1  . Years of education: Not on file  . Highest education level: 9th grade  Occupational History    Employer: DISABLED    Comment: for panic attacks  Tobacco Use  . Smoking status: Former Smoker    Packs/day: 1.50    Years: 50.00    Pack years: 75.00    Types: Cigarettes    Quit date: 11/29/2018    Years since quitting: 2.1  . Smokeless tobacco: Never Used  . Tobacco comment: started at age 56  Vaping Use  . Vaping Use: Never used  Substance and Sexual Activity  . Alcohol use: No    Alcohol/week:  0.0 standard drinks  . Drug use: No  . Sexual activity: Not Currently  Other Topics Concern  . Not on file  Social History Narrative   Pt had one son, who died when was 31 YO in a MVA.   Social Determinants of Health   Financial Resource Strain: Low Risk   . Difficulty of Paying Living Expenses: Not hard at all  Food Insecurity: No Food Insecurity  . Worried About Charity fundraiser in the Last Year: Never true  . Ran Out of Food in the Last Year: Never true  Transportation Needs: No Transportation Needs  . Lack of Transportation (Medical): No  . Lack of Transportation (Non-Medical): No  Physical Activity: Inactive  . Days of Exercise per Week: 0 days  . Minutes of Exercise per Session: 0 min  Stress: No Stress Concern Present  . Feeling of Stress : Not at all  Social Connections: Not on file     Family History:  The patient's family history includes Cancer in her sister; Diabetes in her brother, brother, mother, sister, and sister; Healthy in her sister; Heart disease in her brother and father; Hypertension in her father; Lymphoma in her brother and brother. There is no history of Breast cancer, Ovarian cancer, or Colon cancer.  ROS:   Review of Systems  Constitutional: Positive for malaise/fatigue. Negative for chills, diaphoresis, fever and weight loss.  HENT: Negative for congestion.   Eyes: Negative for discharge and redness.  Respiratory: Positive for shortness of breath. Negative for cough, sputum production and wheezing.        Improved shortness of breath  Cardiovascular: Positive for orthopnea. Negative for chest pain, palpitations, claudication, leg swelling and PND.       Stable two-pillow orthopnea.  Improved lower extremity swelling  Gastrointestinal: Negative for abdominal pain, heartburn, nausea and vomiting.       Improved abdominal distention  Musculoskeletal: Negative for falls and myalgias.  Skin: Negative for rash.  Neurological: Positive for weakness.  Negative for dizziness, tingling, tremors, sensory change, speech change, focal weakness and loss of consciousness.  Endo/Heme/Allergies: Does not bruise/bleed easily.  Psychiatric/Behavioral: Negative  for substance abuse. The patient is not nervous/anxious.   All other systems reviewed and are negative.    EKGs/Labs/Other Studies Reviewed:    Studies reviewed were summarized above. The additional studies were reviewed today:  Coronary CTA 06/30/2020: Aorta: Normal size. Mild aortic atherosclerosis. No dissection.  Aortic Valve: Trileaflet. No calcifications.  Coronary Arteries: Normal coronary origin. Right dominance.  RCA is a large dominant artery that gives rise to PDA and PLVB. There is mild (25-49%) soft plaque stenosis in the proximal to mid vessel followed by mild (25-49%) mixed plaque stenosis.  Left main is a large artery that gives rise to LAD and LCX arteries.  LAD is a large vessel that gives rise to medium size D1 and D2 and small D3; there is long, soft plaque stenosis in the mid vessel mild (25-49%) followed by moderate (50-69%).  LCX is a non-dominant artery that gives rise to one large OM1 branch. There is no plaque.  Other findings:  Normal pulmonary vein drainage into the left atrium.  Normal let atrial appendage without a thrombus.  Normal size of the pulmonary artery.  IMPRESSION: 1. Coronary calcium score of 39.3. This was 5 percentile for age and sex matched control.  2. Normal coronary origin with right dominance.  3. Moderate (50-69%) soft plaque stenosis in the mid LAD; mild (25-49%) soft and mixed plaque stenoses in the proximal to mid RCA.  4. Study will be sent for FFR. __________  FFR 07/01/2020: 1. Left Main: findings 0.99, 0.99  2. LAD: findings 0.99, 0.97 0.92  3. OM: Findings 0.98, 0.88 0.84  4. RCA: findings 0.97, 0.81 0.84  IMPRESSION: FFR not suggestive of significant obstructive  disease. __________  Coronary CTA 06/09/2020: Unable to be performed secondary to heart rate -Calcium score 2.67 which places the patient in the 49th percentile for age and sex matched control __________   2D echo 05/30/2020: 1. Left ventricular ejection fraction, by estimation, is 30 to 35%. The  left ventricle has moderately decreased function. Left ventricular  endocardial border not optimally defined to evaluate regional wall motion.  Left ventricular diastolic parameters  are consistent with Grade II diastolic dysfunction (pseudonormalization).  2. Right ventricular systolic function was not well visualized. The right  ventricular size is normal. Tricuspid regurgitation signal is inadequate  for assessing PA pressure.  3. The mitral valve is normal in structure. Mild mitral valve  regurgitation. No evidence of mitral stenosis.  4. The aortic valve was not well visualized. Aortic valve regurgitation  is not visualized. No aortic stenosis is present.  5. The inferior vena cava is normal in size with greater than 50%  respiratory variability, suggesting right atrial pressure of 3 mmHg.  6. Challenging image quality. __________  2D echo 08/2019: 1. Left ventricular ejection fraction, by visual estimation, is 25 to  30%. The left ventricle has moderate to severely decreased function. There  is no left ventricular hypertrophy.  2. Left ventricular diastolic parameters are consistent with Grade I  diastolic dysfunction (impaired relaxation).  3. The left ventricle demonstrates global hypokinesis.  4. Global right ventricle has normal systolic function.The right  ventricular size is normal. No increase in right ventricular wall  thickness.  5. Left atrial size was normal.  6. TR signal is inadequate for assessing pulmonary artery systolic  Pressure. __________  [facility] 08/2019: Coronary angiography:  Coronary dominance: Right  Left mainstem: Large vessel that  bifurcates into the LAD and left circumflex, no significant disease noted  Left anterior  descending (LAD): Large vessel that extends to the apical region, diagonal branch 2 of moderate size, mild proximal LAD disease estimated at 30%  Left circumflex (LCx): Large vessel with OM branch 2, no significant disease noted  Right coronary artery (RCA): Right dominant vessel with PL and PDA, 70% proximal RCA disease followed by sequential 50% lesion proximal to mid vessel  Left ventriculography: Left ventricular systolic function is normal, LVEF is estimated at 50 to 55%, there is no significant mitral regurgitation , no significant aortic valve stenosis  Right heart pressures RA 10 RV 29/8, 11 PA 27/16 mean 20 Wedge 12 Cardiac output 5.04 Cardiac index 2.51  Final Conclusions:  Single-vessel disease RCA Discussed with interventional cardiology, they will perform FFR to determine if intervention needed Right heart pressures are normal   Recommendations:  We will defer coronary intervention to interventional cardiology Needs weight loss, management of COPD Management of hyperlipidemia __________  iFR 08/2019:  Conclusions: 1. Single vessel coronary with sequential 70% and 50% proximal and mid RCA stenoses that are not hemodynamically significant (iFR = 0.97). See Dr. Donivan Scull diagnostic catheterization report for further details of right/left heart catheterization.  Recommendations: 1. Medical therapy and risk factor modification to prevent progression of disease.    EKG:  EKG is ordered today.  The EKG ordered today demonstrates sinus tachycardia, 105 bpm, LBBB (known)  Recent Labs: 10/17/2020: TSH 2.440 01/23/2021: ALT 116 01/24/2021: BNP 67.0 01/27/2021: BUN 30; Creatinine, Ser 1.07; Magnesium 2.0; Potassium 3.5; Sodium 137  Recent Lipid Panel    Component Value Date/Time   CHOL 79 (L) 01/23/2021 1511   TRIG 215 (H) 01/23/2021 1511   HDL 31 (L) 01/23/2021 1511    CHOLHDL 1.5 11/30/2019 1135   CHOLHDL 1.7 01/01/2018 0000   VLDL 29 08/06/2016 0824   LDLCALC 15 01/23/2021 1511   LDLCALC 29 01/01/2018 0000    PHYSICAL EXAM:    VS:  BP 112/80 (BP Location: Right Arm, Patient Position: Sitting, Cuff Size: Normal)   Pulse (!) 105   Ht '5\' 3"'  (1.6 m)   Wt 241 lb 6 oz (109.5 kg)   SpO2 96%   BMI 42.76 kg/m   BMI: Body mass index is 42.76 kg/m.  Physical Exam Vitals reviewed.  Constitutional:      Appearance: She is well-developed.  HENT:     Head: Normocephalic and atraumatic.  Eyes:     General:        Right eye: No discharge.        Left eye: No discharge.  Neck:     Vascular: No JVD.  Cardiovascular:     Rate and Rhythm: Normal rate and regular rhythm.     Pulses: No midsystolic click and no opening snap.          Posterior tibial pulses are 2+ on the right side and 2+ on the left side.     Heart sounds: Normal heart sounds, S1 normal and S2 normal. Heart sounds not distant. No murmur heard. No friction rub.  Pulmonary:     Effort: Pulmonary effort is normal. No respiratory distress.     Breath sounds: Normal breath sounds. No decreased breath sounds, wheezing or rales.  Chest:     Chest wall: No tenderness.  Abdominal:     General: There is no distension.     Palpations: Abdomen is soft.     Tenderness: There is no abdominal tenderness.  Musculoskeletal:     Cervical back: Normal range of motion.  Skin:    General: Skin is warm and dry.     Nails: There is no clubbing.  Neurological:     Mental Status: She is alert and oriented to person, place, and time.  Psychiatric:        Speech: Speech normal.        Behavior: Behavior normal.        Thought Content: Thought content normal.        Judgment: Judgment normal.     Wt Readings from Last 3 Encounters:  01/27/21 241 lb 6 oz (109.5 kg)  01/24/21 244 lb (110.7 kg)  01/23/21 248 lb 12.8 oz (112.9 kg)     ASSESSMENT & PLAN:   1. HFrEF secondary to NICM with underlying  LBBB: NYHA class III.  Symptoms are overall improving and she appears less volume overloaded when compared to prior documentation.  Suspect her volume overload was in the setting of medication nonadherence and excess fluid intake.  For now, pending updated labs, we will continue to hold losartan and continue her on torsemide 20 mg daily.  Based on updated BMP we may add back losartan and will also replete potassium as indicated.  She will continue Toprol-XL, spironolactone and [place].  She has been intolerant to Entresto secondary to cough.  Continue to optimize medical therapy as able with plans to repeat a limited echo down the road.  In the context of her cardiomyopathy and LBBB, she has been evaluated by EP with recommendation to continue to optimize medical therapy.  2. Nonobstructive CAD: No symptoms concerning for angina.  Prior noted moderate RCA disease with normal IFR in 08/2019 with subsequent coronary CTA in 06/2020 showing nonobstructive disease.  Continue aggressive risk factor modification and medical therapy including aspirin, atorvastatin, Toprol-XL, and Imdur.  No indication for further ischemic testing at this time.  3. PVCs: Quiescent.  Continue Toprol-XL.  4. HLD: LDL of 15 in 01/2021 she remains on atorvastatin with history of fatty liver disease.  5. COPD/restrictive lung disease: Stable.  Followed by pulmonology.  6. Sleep apnea: She has a history of sleep apnea and was previously on CPAP though lost her insurance and therefore no longer has a CPAP at home.  Underlying obesity, OSA, and possible OHS are contributing to her overall presentation.  She should discuss obtaining a new CPAP machine with her pulmonologist.  7. Morbid obesity with deconditioning: Weight loss is advised.  Underlying obesity and physical deconditioning continue to contribute to her overall presentation.  Disposition: F/u with Dr. Rockey Situ or an APP in 2 weeks, and EP as directed.   Medication  Adjustments/Labs and Tests Ordered: Current medicines are reviewed at length with the patient today.  Concerns regarding medicines are outlined above. Medication changes, Labs and Tests ordered today are summarized above and listed in the Patient Instructions accessible in Encounters.   Signed, Christell Faith, PA-C 01/27/2021 12:45 PM     [address] [place] [place], [place] [address] [phone]

## 2021-01-26 NOTE — Telephone Encounter (Signed)
Called pt she is wanting to get back on this for pain had a visit with Jolene 4/4 scheduled with karen 5/18 please advise if pt needs another appt

## 2021-01-26 NOTE — Telephone Encounter (Signed)
Can we find out why it was stopped?

## 2021-01-26 NOTE — Telephone Encounter (Signed)
Patient states that she just never started taking it.

## 2021-01-27 ENCOUNTER — Telehealth: Payer: Self-pay | Admitting: *Deleted

## 2021-01-27 ENCOUNTER — Ambulatory Visit (INDEPENDENT_AMBULATORY_CARE_PROVIDER_SITE_OTHER): Payer: Medicare Other | Admitting: Physician Assistant

## 2021-01-27 ENCOUNTER — Other Ambulatory Visit: Payer: Self-pay

## 2021-01-27 ENCOUNTER — Encounter: Payer: Self-pay | Admitting: Physician Assistant

## 2021-01-27 ENCOUNTER — Telehealth: Payer: Self-pay | Admitting: Pharmacist

## 2021-01-27 ENCOUNTER — Other Ambulatory Visit
Admission: RE | Admit: 2021-01-27 | Discharge: 2021-01-27 | Disposition: A | Discharge status: Home / Self Care | Payer: Medicare Other | Source: Ambulatory Visit | Attending: Physician Assistant | Admitting: Physician Assistant

## 2021-01-27 ENCOUNTER — Telehealth: Payer: Medicare Other

## 2021-01-27 VITALS — BP 112/80 | HR 105 | Ht 63.0 in | Wt 241.4 lb

## 2021-01-27 DIAGNOSIS — E785 Hyperlipidemia, unspecified: Secondary | ICD-10-CM | POA: Diagnosis not present

## 2021-01-27 DIAGNOSIS — I493 Ventricular premature depolarization: Secondary | ICD-10-CM | POA: Diagnosis not present

## 2021-01-27 DIAGNOSIS — I2510 Atherosclerotic heart disease of native coronary artery without angina pectoris: Secondary | ICD-10-CM

## 2021-01-27 DIAGNOSIS — G4733 Obstructive sleep apnea (adult) (pediatric): Secondary | ICD-10-CM | POA: Diagnosis not present

## 2021-01-27 DIAGNOSIS — J984 Other disorders of lung: Secondary | ICD-10-CM

## 2021-01-27 DIAGNOSIS — I447 Left bundle-branch block, unspecified: Secondary | ICD-10-CM | POA: Diagnosis not present

## 2021-01-27 DIAGNOSIS — I428 Other cardiomyopathies: Secondary | ICD-10-CM

## 2021-01-27 DIAGNOSIS — I5042 Chronic combined systolic (congestive) and diastolic (congestive) heart failure: Secondary | ICD-10-CM

## 2021-01-27 LAB — BASIC METABOLIC PANEL
Anion gap: 17 — ABNORMAL HIGH (ref 5–15)
BUN: 30 mg/dL — ABNORMAL HIGH (ref 8–23)
CO2: 25 mmol/L (ref 22–32)
Calcium: 9 mg/dL (ref 8.9–10.3)
Chloride: 95 mmol/L — ABNORMAL LOW (ref 98–111)
Creatinine, Ser: 1.07 mg/dL — ABNORMAL HIGH (ref 0.44–1.00)
GFR, Estimated: 57 mL/min — ABNORMAL LOW (ref >60)
Glucose, Bld: 251 mg/dL — ABNORMAL HIGH (ref 70–99)
Potassium: 3.5 mmol/L (ref 3.5–5.1)
Sodium: 137 mmol/L (ref 135–145)

## 2021-01-27 LAB — MAGNESIUM: Magnesium: 2 mg/dL (ref 1.7–2.4)

## 2021-01-27 MED ORDER — POTASSIUM CHLORIDE ER 10 MEQ PO TBCR: 10.0000 meq | EXTENDED_RELEASE_TABLET | ORAL | 3 refills | Status: DC

## 2021-01-27 MED ORDER — TORSEMIDE 20 MG PO TABS: 20.0000 mg | ORAL_TABLET | ORAL | 3 refills | Status: DC

## 2021-01-27 NOTE — Telephone Encounter (Signed)
Reviewed results and recommendations with patient. Instructed her to hold torsemide tomorrow 4/9 then starting on Sunday to take torsemide every other day. Discussed need for repeat labs in one week to monitor her kidneys. Also discussed she should continue to hold her losartan. Reviewed that we are going to start her on potassium 10 mEq when she takes a torsemide. She read back information on medication changes. Discussed need for her to follow up with her primary care provider for management of her diabetes. She states that she saw her last week for this and back on 3/29 and her A1c was elevated and they are working on this. She did request that I send this information to her via My Chart and she was agreeable with this plan. She verbalized understanding of our conversation, agreement with plan, and had no further questions at this time. Reviewed that once she gets the My Chart message if she has any questions to please give [place] a call back. She was appreciative for the review and updates.

## 2021-01-27 NOTE — Telephone Encounter (Signed)
Left voicemail message to call back for review of results and recommendations.  

## 2021-01-27 NOTE — Telephone Encounter (Signed)
-----   Message from Rise Mu, PA-C sent at 01/27/2021 10:48 AM EDT ----- Magnesium at goal. Potassium is low normal though below goal. Fasting glucose is elevated. Renal function is elevated.  Recommendations: -We will need to continue to diurese though at a slower pace -Please have her hold torsemide on 4/9 -Resume torsemide at 20 mg daily every other day on 4/10 -Follow-up BMET in 1 week -With mild AKI we will continue to hold losartan -She should take KCl 10 mEq with her torsemide -Schedule appointment with PCP for further management of uncontrolled diabetes

## 2021-01-27 NOTE — Patient Instructions (Addendum)
Medication Instructions:  We will call you with any medication changes.   *If you need a refill on your cardiac medications before your next appointment, please call your pharmacy*   Lab Work: BMET & Mag to be done at the [facility]. Go to registration and they will check you in and direct you where to go.    If you have labs (blood work) drawn today and your tests are completely normal, you will receive your results only by: Marland Kitchen MyChart Message (if you have MyChart) OR . A paper copy in the mail If you have any lab test that is abnormal or we need to change your treatment, we will call you to review the results.   Testing/Procedures: None   Follow-Up: At [facility], you and your health needs are our priority.  As part of our continuing mission to provide you with exceptional heart care, we have created designated Provider Care Teams.  These Care Teams include your primary Cardiologist (physician) and Advanced Practice Providers (APPs -  Physician Assistants and Nurse Practitioners) who all work together to provide you with the care you need, when you need it.   Your next appointment:   2 week(s)  The format for your next appointment:   In Person  Provider:   Ida Rogue, MD or Christell Faith, PA-C

## 2021-02-02 ENCOUNTER — Telehealth: Payer: Self-pay

## 2021-02-02 NOTE — Progress Notes (Signed)
    Chronic Care Management Pharmacy Assistant   Name: Juanelle P Kerby  MRN: [id number] DOB: 12/20/1950  Reason for Encounter: Initial Question/ Rs    Medications: Outpatient Encounter Medications as of 02/02/2021  Medication Sig  . aspirin 81 MG tablet Take 81 mg by mouth daily.   . atorvastatin (LIPITOR) 10 MG tablet Take 1 tablet (10 mg total) by mouth at bedtime.  . blood glucose meter kit and supplies Dispense based on patient and insurance preference. Check fasting blood sugar once daily. (FOR ICD E11.69).  . dapagliflozin propanediol (FARXIGA) 10 MG TABS tablet Take 1 tablet (10 mg total) by mouth daily before breakfast.  . gabapentin (NEURONTIN) 300 MG capsule Take 1 capsule (300 mg total) by mouth 3 (three) times daily.  . isosorbide mononitrate (IMDUR) 30 MG 24 hr tablet TAKE 1 TABLET BY MOUTH  DAILY  . Lancets (ONETOUCH DELICA PLUS LANCET30G) MISC daily.   . metFORMIN (GLUCOPHAGE) 1000 MG tablet Take 1 tablet (1,000 mg total) by mouth 2 (two) times daily with a meal.  . metoprolol succinate (TOPROL-XL) 25 MG 24 hr tablet TAKE 1 AND 1/2 TABLETS BY  MOUTH DAILY  . ONETOUCH ULTRA test strip 1 each daily.  . pantoprazole (PROTONIX) 40 MG tablet Take 1 tablet (40 mg total) by mouth daily.  . polyethylene glycol powder (GLYCOLAX/MIRALAX) 17 GM/SCOOP powder Take 1-2 doses daily as needed for constipation/to prevent constipation  . potassium chloride (KLOR-CON) 10 MEQ tablet Take 1 tablet (10 mEq total) by mouth as directed. Take with your Torsemide.  . QUEtiapine (SEROQUEL) 100 MG tablet TAKE 1 TABLET BY MOUTH AT  BEDTIME  . spironolactone (ALDACTONE) 25 MG tablet Take 0.5 tablets (12.5 mg total) by mouth daily.  . torsemide (DEMADEX) 20 MG tablet Take 1 tablet (20 mg total) by mouth every other day. Take Potassium with Torsemide.   No facility-administered encounter medications on file as of 02/02/2021.   Note opened while patient is currently at [facility]    CPA, CMA 

## 2021-02-03 NOTE — Addendum Note (Signed)
Addended by: Raelene Bott, Keyron Pokorski L on: 02/03/2021 08:21 AM   Modules accepted: Orders

## 2021-02-07 ENCOUNTER — Encounter: Payer: Self-pay | Admitting: Nurse Practitioner

## 2021-02-07 LAB — HM DIABETES EYE EXAM

## 2021-02-07 NOTE — Progress Notes (Signed)
Cardiology Office Note    Date:  02/10/2021   ID:  Ferrell, Flam 08/23/1951, MRN [id number]  PCP:  [name], NP  Cardiologist:  Ida Rogue, MD  Electrophysiologist:  [name], MD   Chief Complaint: Follow-up  History of Present Illness:   Kim Buchanan is a 70 y.o. female with history of nonobstructive CAD, HFrEF secondary toNICM,COPD secondary to tobacco use quitting in 11/2018,restrictive lung disease,DM2, HLD, chronic dyspnea, obesity, untreated sleep apnea, GERD, anxiety, and depression who presents for follow-up of her cardiomyopathy.  She was previously followed by Dr. Jenita Seashore underwent echo in 03/2019 which showed normal LV systolic function. Stress testing was undertaken in the setting of ongoing dyspnea which showed anterior ischemia. Following this, there was recommendation for coronary CTA though the patient did not follow-up and was subsequently evaluated by pulmonology. In this setting,she was referred to Dr. Rockey Situ who saw the patient in 07/2019 for follow-up of the above prior cardiac testing. Records were not available at that time, therefore, sheunderwent repeat echo in 08/2019,which revealed an EF of 25 to 30%, global hypokinesis, and grade 1 diastolic dysfunction. There was no significant valvular disease. Given her new cardiomyopathy, she underwent diagnostic cardiac cath in 08/2019 which revealed a 70% stenosis in the proximal RCA. iFR was normal at 0.97. Medical therapy was recommended. Of note, LV gram was performed and showed an EF of 50 to 55%. Right heart cath showed normal filling pressures. She was seen in the office in 09/2019 and was doing well from a cardiac perspective. She was noted to have resolution of cough following discontinuation of losartan. She was started on low-dose carvedilol and referred to cardiopulmonary rehab.   She was seen in the office on 05/19/2020,and noted stable symptoms over the past  couple of years. She followed up with pulmonology with lung disease felt to be less likely the etiology of her symptoms, with mild restrictive physiology noted on PFTs. She was unable to participate in cardiopulmonary rehab secondary to dyspnea. It was noted she did not tolerate Coreg secondary to cough and self discontinued it. She underwent echo, per pulmonology on 05/30/2020, which showed an EF of 30-35%, Gr2DD, normal RV cavity size, and mild MR.She was scheduled for coronary CTA on 06/09/2020, whichwas unable to be performed secondary to heart rates.She was able to undergo calcium scoring with a calcium score of 2.67 which placed the patient in the 49th percentile for age and sex matched control.She was seen in follow up on 8/20/2021and continued to feel about the same with ongoing exertional dyspnea. She subsequently underwent coronary CTA on 06/30/2020 which showed nonobstructive CAD as outlined below. In this setting, she was started on losartan to further optimize her medical therapy. She was seen by pulmonology on 07/12/2020,in follow up with noted minimal obstructive, if any, and mild restrictive lung disease. She was seen in the office on 07/20/2020 and continued to feel about the same with stable exertional dyspnea that has been present for the past several years. She did not notice any changes in her dyspnea following the addition of inhaler therapy by pulmonology. With continued PVCs, Toprol-XL was titrated to 37.5 mg daily. She was also started on spironolactone 12.5 mg.She was seen in the office on 08/22/2020 noting a 3-day history of increased chest congestion, shortness of breath, and nonproductive cough. Her weight was stable. Lasix 20 mg daily for 5 days was added. I last saw her on 09/07/2020 at which time she noted an improvement  in her dyspnea while on scheduled low-dose Lasix 20 mg daily with a return in her dyspnea and exertional fatigue while being off this medication. In  this setting, she was placed on scheduled low-dose Lasix 20 mg daily.  She was seen by Dr. Quentin Ore on 10/05/2020 for further evaluation of her NICM and left bundle branch block. She was placed on Entresto with recommendation to follow-up echo in approximately 3 months. If her cardiomyopathy persisted CRT-D was recommended. She was also referred to the advanced heart failure clinic in [place].  She was seen in 10/2020 for follow-up and it was noted she did not tolerate Entresto secondary to cough and self discontinued this medication.  She had concerns about the affordability of [name], though with patient assistance paperwork this was subsequently approved.  She was mildly volume up and was advised to increase Lasix to 40 mg daily for 3 days followed by 20 mg daily thereafter.  She was placed back on losartan.  She was seen by EP in 12/2020 with continued volume overload and shortness of breath.  Echo in 12/2020 showed a persistent cardiomyopathy with an EF of 30 to 35%, global hypokinesis, indeterminate LV diastolic function parameters, normal RV systolic function and ventricular cavity size, and mild mitral valve regurgitation.  Her weight was up 4 pounds when compared to her visit in 10/2020.  Her Lasix was increased to 40 mg daily and losartan was titrated to 25 mg daily.  She was seen in the office on 01/24/2021 by Marrianne Mood, PA-C with a weight that was down 1 pound when compared to her visit in 12/2020.  She continued to note shortness of breath and reported having recently been seen by her PCP with chest x-ray without evidence of pleural effusions or vascular congestion.  She was advised at PCP's office to increase Lasix to 60 mg briefly with noted slight improvement in symptoms after 1 dose.  At her visit with cardiology, she indicated she had not taken her Lasix for several days and was taking this on a as needed basis rather than the recommended 40 mg daily.  BP was soft at 100/76.  Her  losartan was held on 4/5 secondary to soft BP, and she was placed on torsemide 20 mg daily in place of Lasix 40 mg daily.    I saw her in follow up on 01/27/2021, at which time she reported feeling slightly better.  Her weight was down 3 pounds from 4/5 visit.  She was adherent to torsemide 20 mg daily.  She did note a slight improvement in symptoms.  She had also cut back on her fluid consumption.  Follow up labs obtained at that time showed an up-trending BUN/SCr.  In this setting, she was advised to hold torsemide x 1 day then resume at 20 mg every other day.  Follow up labs remain pending at this time.   She comes in accompanied by her husband today and feels about the same as she did at her last visit.  She has been adherent to torsemide 20 mg every other day following her BMP obtained as outlined below.  Initially she was monitoring her fluid intake to less than 2 L daily, however more recently due to increased thirst she has increased her fluid intake.  She has stable two-pillow orthopnea.  No lower extremity swelling, PND, or early satiety.  She feels like her abdominal distention is about the same.  No chest pain.  She does note some intermittent nausea and  sweating.  No angina, palpitations, dizziness, presyncope, or syncope.   Labs independently reviewed: 01/2021 - BUN 30, serum creatinine 1.07, potassium 3.5 01/2021 - TC 79, TG 215, HDL 31, LDL 15, albumin 4.0, AST 76, ALT 116, A1c 8.6 12/2020 - magnesium 2.1 09/2020 - TSH normal 11/2019 - Hgb 13.2, PLT 306  Past Medical History:  Diagnosis Date  . Anxiety   . Arthritis    lower back  . COPD (chronic obstructive pulmonary disease) ([place])   . Depression   . Diabetes mellitus without complication ([place])   . GERD (gastroesophageal reflux disease)   . Hyperlipidemia   . Morbid obesity ([place])   . NICM (nonischemic cardiomyopathy) ([place])    a. 03/2019 Echo: Nl LVEF; b. 09/04/2019 Echo: EF 25-30%, Gr1 DD, glob HK, nl RV fxn; c. 09/11/2019 Cath:  nonobs LAD/RCA dzs. EF 50-55%. RA 10, RV 29/8(11), PA 27/16(20), PCWP 12. CO/CI 5.04/2.51.  Marland Kitchen Nonobstructive CAD (coronary artery disease)    a. 03/2019 [facility]): EF 63%, ant ischemia; b. 08/2019 Cath: LM nl, LAD 53m RCA 70p (iFR nl @ 0.97), 50p. EF 50-55%-->Med Rx.  . Osteoporosis   . Panic attack   . PTSD (post-traumatic stress disorder)   . Rectal fistula   . Sleep apnea    a. Prev wore CPAP but lost insurance and hasn't been using since (now has insurance).  . Wears dentures    full upper    Past Surgical History:  Procedure Laterality Date  . ABDOMINAL HYSTERECTOMY    . CARDIAC CATHETERIZATION    . COLONOSCOPY  2014  . COLONOSCOPY WITH PROPOFOL N/A 10/01/2016   Procedure: COLONOSCOPY WITH PROPOFOL;  Surgeon: DLucilla Lame MD;  [facility]  Service: Endoscopy;  Laterality: N/A;  . CORONARY PRESSURE WIRE/FFR WITH 3D MAPPING N/A 09/11/2019   Procedure: Coronary Pressure Wire/FFR w/3D Mapping;  Surgeon: ENelva Bush MD;  [facility];  Service: Cardiovascular;  Laterality: N/A;  . CYSTOCELE REPAIR N/A 12/17/2016   Procedure: ANTERIOR REPAIR (CYSTOCELE);  Surgeon: MBrayton Mars MD;  Location: [facility];  Service: Gynecology;  Laterality: N/A;  . ESOPHAGOGASTRODUODENOSCOPY (EGD) WITH PROPOFOL N/A 08/11/2019   Procedure: ESOPHAGOGASTRODUODENOSCOPY (EGD) WITH PROPOFOL;  Surgeon: WLucilla Lame MD;  Location: [facility];  Service: Endoscopy;  Laterality: N/A;  . POLYPECTOMY  10/01/2016   Procedure: POLYPECTOMY;  Surgeon: DLucilla Lame MD;  [facility]  Service: Endoscopy;;  . RECTAL SURGERY  06/28/2015   Rectal prolapse, laparoscopic rectopexy the coldCharmont, MD; [facility]  . RIGHT/LEFT HEART CATH AND CORONARY ANGIOGRAPHY Bilateral 09/11/2019   Procedure: RIGHT/LEFT HEART CATH AND CORONARY ANGIOGRAPHY;  Surgeon: GMinna Merritts MD;  [facility];  Service: Cardiovascular;  Laterality: Bilateral;  . TUBAL  LIGATION    . VAGINAL HYSTERECTOMY Bilateral 12/17/2016   Procedure: HYSTERECTOMY VAGINAL WITH BILATERAL SALPINGO OOPHERECTOMY;  Surgeon: MBrayton Mars MD;  Location: [facility];  Service: Gynecology;  Laterality: Bilateral;    Current Medications: Current Meds  Medication Sig  . aspirin 81 MG tablet Take 81 mg by mouth daily.   .Marland Kitchenatorvastatin (LIPITOR) 10 MG tablet Take 1 tablet (10 mg total) by mouth at bedtime.  . blood glucose meter kit and supplies Dispense based on patient and insurance preference. Check fasting blood sugar once daily. (FOR ICD E11.69).  . dapagliflozin propanediol (FARXIGA) 10 MG TABS tablet Take 1 tablet (10 mg total) by mouth daily before breakfast.  . gabapentin (NEURONTIN) 300 MG capsule Take 1 capsule (  300 mg total) by mouth 3 (three) times daily.  . isosorbide mononitrate (IMDUR) 30 MG 24 hr tablet TAKE 1 TABLET BY MOUTH  DAILY  . Lancets (ONETOUCH DELICA PLUS [id number]) MISC daily.   . metFORMIN (GLUCOPHAGE) 1000 MG tablet Take 1 tablet (1,000 mg total) by mouth 2 (two) times daily with a meal.  . ONETOUCH ULTRA test strip 1 each daily.  . pantoprazole (PROTONIX) 40 MG tablet Take 1 tablet (40 mg total) by mouth daily.  . polyethylene glycol powder (GLYCOLAX/MIRALAX) 17 GM/SCOOP powder Take 1-2 doses daily as needed for constipation/to prevent constipation  . potassium chloride (KLOR-CON) 10 MEQ tablet Take 1 tablet (10 mEq total) by mouth as directed. Take with your Torsemide.  Marland Kitchen QUEtiapine (SEROQUEL) 100 MG tablet TAKE 1 TABLET BY MOUTH AT  BEDTIME  . spironolactone (ALDACTONE) 25 MG tablet Take 0.5 tablets (12.5 mg total) by mouth daily.  Marland Kitchen torsemide (DEMADEX) 20 MG tablet Take 1 tablet (20 mg total) by mouth every other day. Take Potassium with Torsemide.  . [DISCONTINUED] metoprolol succinate (TOPROL-XL) 25 MG 24 hr tablet TAKE 1 AND 1/2 TABLETS BY  MOUTH DAILY    Allergies:   Lisinopril   Social History   Socioeconomic History  . Marital  status: Married    Spouse name: Paediatric nurse  . Number of children: 1  . Years of education: Not on file  . Highest education level: 9th grade  Occupational History    Employer: DISABLED    Comment: for panic attacks  Tobacco Use  . Smoking status: Former Smoker    Packs/day: 1.50    Years: 50.00    Pack years: 75.00    Types: Cigarettes    Quit date: 11/29/2018    Years since quitting: 2.2  . Smokeless tobacco: Never Used  . Tobacco comment: started at age 66  Vaping Use  . Vaping Use: Never used  Substance and Sexual Activity  . Alcohol use: No    Alcohol/week: 0.0 standard drinks  . Drug use: No  . Sexual activity: Not Currently  Other Topics Concern  . Not on file  Social History Narrative   Pt had one son, who died when was 80 YO in a MVA.   Social Determinants of Health   Financial Resource Strain: Low Risk   . Difficulty of Paying Living Expenses: Not hard at all  Food Insecurity: No Food Insecurity  . Worried About Charity fundraiser in the Last Year: Never true  . Ran Out of Food in the Last Year: Never true  Transportation Needs: No Transportation Needs  . Lack of Transportation (Medical): No  . Lack of Transportation (Non-Medical): No  Physical Activity: Inactive  . Days of Exercise per Week: 0 days  . Minutes of Exercise per Session: 0 min  Stress: No Stress Concern Present  . Feeling of Stress : Not at all  Social Connections: Not on file     Family History:  The patient's family history includes Cancer in her sister; Diabetes in her brother, brother, mother, sister, and sister; Healthy in her sister; Heart disease in her brother and father; Hypertension in her father; Lymphoma in her brother and brother. There is no history of Breast cancer, Ovarian cancer, or Colon cancer.  ROS:   Review of Systems  Constitutional: Positive for malaise/fatigue. Negative for chills, fever and weight loss.  HENT: Negative for congestion.   Eyes: Negative for discharge and  redness.  Respiratory: Positive for shortness of breath.  Negative for cough, sputum production and wheezing.   Cardiovascular: Positive for orthopnea. Negative for chest pain, palpitations, claudication, leg swelling and PND.       Stable two-pillow orthopnea  Gastrointestinal: Positive for nausea. Negative for abdominal pain, heartburn and vomiting.  Musculoskeletal: Negative for falls and myalgias.  Skin: Negative for rash.  Neurological: Positive for weakness. Negative for dizziness, tingling, tremors, sensory change, speech change, focal weakness and loss of consciousness.  Endo/Heme/Allergies: Does not bruise/bleed easily.  Psychiatric/Behavioral: Negative for substance abuse. The patient is not nervous/anxious.   All other systems reviewed and are negative.    EKGs/Labs/Other Studies Reviewed:    Studies reviewed were summarized above. The additional studies were reviewed today:  Coronary CTA 06/30/2020: Aorta: Normal size. Mild aortic atherosclerosis. No dissection.  Aortic Valve: Trileaflet. No calcifications.  Coronary Arteries: Normal coronary origin. Right dominance.  RCA is a large dominant artery that gives rise to PDA and PLVB. There is mild (25-49%) soft plaque stenosis in the proximal to mid vessel followed by mild (25-49%) mixed plaque stenosis.  Left main is a large artery that gives rise to LAD and LCX arteries.  LAD is a large vessel that gives rise to medium size D1 and D2 and small D3; there is long, soft plaque stenosis in the mid vessel mild (25-49%) followed by moderate (50-69%).  LCX is a non-dominant artery that gives rise to one large OM1 branch. There is no plaque.  Other findings:  Normal pulmonary vein drainage into the left atrium.  Normal let atrial appendage without a thrombus.  Normal size of the pulmonary artery.  IMPRESSION: 1. Coronary calcium score of 39.3. This was 22 percentile for age and sex matched  control.  2. Normal coronary origin with right dominance.  3. Moderate (50-69%) soft plaque stenosis in the mid LAD; mild (25-49%) soft and mixed plaque stenoses in the proximal to mid RCA.  4. Study will be sent for FFR. __________  FFR 07/01/2020: 1. Left Main: findings 0.99, 0.99  2. LAD: findings 0.99, 0.97 0.92  3. OM: Findings 0.98, 0.88 0.84  4. RCA: findings 0.97, 0.81 0.84  IMPRESSION: FFR not suggestive of significant obstructive disease. __________  Coronary CTA 06/09/2020: Unable to be performed secondary to heart rate -Calcium score 2.67 which places the patient in the 49th percentile for age and sex matched control __________   2D echo 05/30/2020: 1. Left ventricular ejection fraction, by estimation, is 30 to 35%. The  left ventricle has moderately decreased function. Left ventricular  endocardial border not optimally defined to evaluate regional wall motion.  Left ventricular diastolic parameters  are consistent with Grade II diastolic dysfunction (pseudonormalization).  2. Right ventricular systolic function was not well visualized. The right  ventricular size is normal. Tricuspid regurgitation signal is inadequate  for assessing PA pressure.  3. The mitral valve is normal in structure. Mild mitral valve  regurgitation. No evidence of mitral stenosis.  4. The aortic valve was not well visualized. Aortic valve regurgitation  is not visualized. No aortic stenosis is present.  5. The inferior vena cava is normal in size with greater than 50%  respiratory variability, suggesting right atrial pressure of 3 mmHg.  6. Challenging image quality. __________  2D echo 08/2019: 1. Left ventricular ejection fraction, by visual estimation, is 25 to  30%. The left ventricle has moderate to severely decreased function. There  is no left ventricular hypertrophy.  2. Left ventricular diastolic parameters are consistent with Grade I  diastolic  dysfunction (impaired relaxation).  3. The left ventricle demonstrates global hypokinesis.  4. Global right ventricle has normal systolic function.The right  ventricular size is normal. No increase in right ventricular wall  thickness.  5. Left atrial size was normal.  6. TR signal is inadequate for assessing pulmonary artery systolic  Pressure. __________  [facility] 08/2019: Coronary angiography:  Coronary dominance: Right  Left mainstem: Large vessel that bifurcates into the LAD and left circumflex, no significant disease noted  Left anterior descending (LAD): Large vessel that extends to the apical region, diagonal branch 2 of moderate size, mild proximal LAD disease estimated at 30%  Left circumflex (LCx): Large vessel with OM branch 2, no significant disease noted  Right coronary artery (RCA): Right dominant vessel with PL and PDA, 70% proximal RCA disease followed by sequential 50% lesion proximal to mid vessel  Left ventriculography: Left ventricular systolic function is normal, LVEF is estimated at 50 to 55%, there is no significant mitral regurgitation , no significant aortic valve stenosis  Right heart pressures RA 10 RV 29/8, 11 PA 27/16 mean 20 Wedge 12 Cardiac output 5.04 Cardiac index 2.51  Final Conclusions:  Single-vessel disease RCA Discussed with interventional cardiology, they will perform FFR to determine if intervention needed Right heart pressures are normal   Recommendations:  We will defer coronary intervention to interventional cardiology Needs weight loss, management of COPD Management of hyperlipidemia __________  iFR 08/2019:  Conclusions: 1. Single vessel coronary with sequential 70% and 50% proximal and mid RCA stenoses that are not hemodynamically significant (iFR = 0.97). See Dr. Donivan Scull diagnostic catheterization report for further details of right/left heart catheterization.  Recommendations: 1. Medical therapy and  risk factor modification to prevent progression of disease.    EKG:  EKG is ordered today.  The EKG ordered today demonstrates NSR, 90 bpm, occasional PVCs, LBBB  Recent Labs: 10/17/2020: TSH 2.440 01/23/2021: ALT 116 01/24/2021: BNP 67.0 01/27/2021: Magnesium 2.0 02/10/2021: BUN 24; Creatinine, Ser 0.80; Potassium 4.2; Sodium 139  Recent Lipid Panel    Component Value Date/Time   CHOL 79 (L) 01/23/2021 1511   TRIG 215 (H) 01/23/2021 1511   HDL 31 (L) 01/23/2021 1511   CHOLHDL 1.5 11/30/2019 1135   CHOLHDL 1.7 01/01/2018 0000   VLDL 29 08/06/2016 0824   LDLCALC 15 01/23/2021 1511   LDLCALC 29 01/01/2018 0000    PHYSICAL EXAM:    VS:  BP 118/62 (BP Location: Right Arm, Patient Position: Sitting, Cuff Size: Large)   Pulse 90   Ht _0  (1.6 m)   Wt 242 lb 8 oz (110 kg)   SpO2 96%   BMI 42.96 kg/m   BMI: Body mass index is 42.96 kg/m.  Physical Exam Vitals reviewed.  Constitutional:      Appearance: She is well-developed.  HENT:     Head: Normocephalic and atraumatic.  Eyes:     General:        Right eye: No discharge.        Left eye: No discharge.  Neck:     Comments: JVD difficult to assess secondary to body habitus Cardiovascular:     Rate and Rhythm: Normal rate and regular rhythm.     Pulses: No midsystolic click and no opening snap.          Posterior tibial pulses are 2+ on the right side and 2+ on the left side.     Heart sounds: Normal heart sounds, S1 normal and S2 normal. Heart sounds not  distant. No murmur heard. No friction rub.  Pulmonary:     Effort: Pulmonary effort is normal. No respiratory distress.     Breath sounds: Examination of the right-lower field reveals decreased breath sounds. Examination of the left-lower field reveals decreased breath sounds. Decreased breath sounds present. No wheezing or rales.  Chest:     Chest wall: No tenderness.  Abdominal:     General: There is distension.     Palpations: Abdomen is soft.     Tenderness: There  is no abdominal tenderness.  Musculoskeletal:     Cervical back: Normal range of motion.  Skin:    General: Skin is warm and dry.     Nails: There is no clubbing.  Neurological:     Mental Status: She is alert and oriented to person, place, and time.  Psychiatric:        Speech: Speech normal.        Behavior: Behavior normal.        Thought Content: Thought content normal.        Judgment: Judgment normal.     Wt Readings from Last 3 Encounters:  02/10/21 242 lb 8 oz (110 kg)  01/27/21 241 lb 6 oz (109.5 kg)  01/24/21 244 lb (110.7 kg)     ASSESSMENT & PLAN:   1. HFrEF secondary to NICM with underlying LBBB: NYHA class III symptoms.  Volume status is somewhat difficult to assess on physical exam secondary to body habitus though she does continue to appear volume up with mild abdominal distention and diminished breath sounds along the bilateral bases.  Her weight is up 1.2 pounds when compared to her last visit.  We will check a stat BMP in the medical mall today.  If renal function is improved to stable we will plan to add metolazone 2.5 mg daily for 2 days along with added KCl and have her take torsemide 20 mg daily for the next 2 days while on metolazone.  Following this she will resume torsemide 20 mg and KCl every other day.  We will plan to have her come to the medical mall for a follow-up BMP on 4/25.  She will otherwise continue current GDMT including Farxiga, Toprol-XL, and spironolactone.  Not currently on losartan given prior relative hypotension and need for escalation of Toprol to suppress PVC burden.  We will plan to continue to optimize medical therapy and pursue a repeat limited echo down the road.  In the context of her cardiomyopathy and underlying left bundle branch block she has been evaluated by EP with plans to continue to optimize medical therapy and diurese for now.  CHF education.  2. Nonobstructive CAD: No symptoms concerning for angina.  Prior noted moderate RCA  disease with normal iFR in 08/2019 with subsequent coronary CTA in 06/2020 showing nonobstructive disease.  Continue risk factor modification and medical therapy including aspirin, atorvastatin, Toprol-XL, and Imdur.  No indication for further ischemic testing at this time.  3. PVCs: Occasional PVCs noted on twelve-lead EKG today.  Titrate Toprol-XL to 50 mg daily.  4. HLD: LDL of 15 in 01/2021.  She remains on atorvastatin with history of fatty liver disease.  5. COPD/restrictive lung disease: Stable.  Followed by pulmonology.  6. Sleep apnea: She has a history of underlying sleep apnea and was previously on CPAP though no longer has her CPAP machine given prior loss of insurance.  Underlying obesity, OSA, and possible OHS are contributing to her overall presentation.  Continue to  recommend she discuss obtaining a new CPAP machine with her pulmonologist.  7. Morbid obesity with deconditioning: Weight loss is advised.  Underlying obesity and physical deconditioning continue to contribute to her overall presentation.  8. AKI: Check BMP.  9. Diabetes: Poorly controlled.  Follow-up with PCP as directed.   Disposition: F/u with Dr. Rockey Situ or an APP in 1 month, and EP as directed.    Medication Adjustments/Labs and Tests Ordered: Current medicines are reviewed at length with the patient today.  Concerns regarding medicines are outlined above. Medication changes, Labs and Tests ordered today are summarized above and listed in the Patient Instructions accessible in Encounters.   Signed, Christell Faith, PA-C 02/10/2021 10:50 AM     [address] [place], [place] [address] [phone]

## 2021-02-10 ENCOUNTER — Ambulatory Visit (INDEPENDENT_AMBULATORY_CARE_PROVIDER_SITE_OTHER): Payer: Medicare Other | Admitting: Physician Assistant

## 2021-02-10 ENCOUNTER — Other Ambulatory Visit
Admission: RE | Admit: 2021-02-10 | Discharge: 2021-02-10 | Disposition: A | Discharge status: Home / Self Care | Payer: Medicare Other | Attending: Physician Assistant | Admitting: Physician Assistant

## 2021-02-10 ENCOUNTER — Encounter: Payer: Self-pay | Admitting: Physician Assistant

## 2021-02-10 ENCOUNTER — Telehealth: Payer: Self-pay | Admitting: *Deleted

## 2021-02-10 ENCOUNTER — Other Ambulatory Visit: Payer: Self-pay

## 2021-02-10 VITALS — BP 118/62 | HR 90 | Ht 63.0 in | Wt 242.5 lb

## 2021-02-10 DIAGNOSIS — E1142 Type 2 diabetes mellitus with diabetic polyneuropathy: Secondary | ICD-10-CM

## 2021-02-10 DIAGNOSIS — N179 Acute kidney failure, unspecified: Secondary | ICD-10-CM | POA: Diagnosis not present

## 2021-02-10 DIAGNOSIS — E785 Hyperlipidemia, unspecified: Secondary | ICD-10-CM

## 2021-02-10 DIAGNOSIS — J984 Other disorders of lung: Secondary | ICD-10-CM

## 2021-02-10 DIAGNOSIS — I493 Ventricular premature depolarization: Secondary | ICD-10-CM

## 2021-02-10 DIAGNOSIS — I2510 Atherosclerotic heart disease of native coronary artery without angina pectoris: Secondary | ICD-10-CM

## 2021-02-10 DIAGNOSIS — I428 Other cardiomyopathies: Secondary | ICD-10-CM | POA: Diagnosis not present

## 2021-02-10 DIAGNOSIS — G4733 Obstructive sleep apnea (adult) (pediatric): Secondary | ICD-10-CM

## 2021-02-10 DIAGNOSIS — I447 Left bundle-branch block, unspecified: Secondary | ICD-10-CM

## 2021-02-10 DIAGNOSIS — I5020 Unspecified systolic (congestive) heart failure: Secondary | ICD-10-CM

## 2021-02-10 LAB — BASIC METABOLIC PANEL
Anion gap: 11 (ref 5–15)
BUN: 24 mg/dL — ABNORMAL HIGH (ref 8–23)
CO2: 21 mmol/L — ABNORMAL LOW (ref 22–32)
Calcium: 8.6 mg/dL — ABNORMAL LOW (ref 8.9–10.3)
Chloride: 107 mmol/L (ref 98–111)
Creatinine, Ser: 0.8 mg/dL (ref 0.44–1.00)
GFR, Estimated: >60 mL/min (ref >60)
Glucose, Bld: 183 mg/dL — ABNORMAL HIGH (ref 70–99)
Potassium: 4.2 mmol/L (ref 3.5–5.1)
Sodium: 139 mmol/L (ref 135–145)

## 2021-02-10 MED ORDER — METOLAZONE 2.5 MG PO TABS: 2.5000 mg | ORAL_TABLET | Freq: Every day | ORAL | 0 refills | Status: DC

## 2021-02-10 MED ORDER — METOPROLOL SUCCINATE ER 25 MG PO TB24: 50.0000 mg | ORAL_TABLET | Freq: Every day | ORAL | 2 refills | Status: DC

## 2021-02-10 NOTE — Telephone Encounter (Signed)
-----   Message from Rise Mu, PA-C sent at 02/10/2021  9:57 AM EDT ----- Potassium at goal Random glucose is elevated with known diabetes Renal function is improved and back to her baseline  Recommendations: Please have the patient take metolazone 2.5 mg along with torsemide 20 mg on 4/22 and again on 4/23.  With this she will take 20 mEq of KCl each day given that she is already on spironolactone.  She should resume torsemide 20 mg and KCl 10 mEq every other day beginning on 4/25.  Follow-up BMET on 4/25.

## 2021-02-10 NOTE — Telephone Encounter (Signed)
Spoke with patient and reviewed results and recommendations. Sent her My Chart message with these instructions and advised if she has any questions to please give [place] a call back. She verbalized understanding and was agreeable with plan.

## 2021-02-10 NOTE — Patient Instructions (Addendum)
Medication Instructions:  Your physician has recommended you make the following change in your medication:   1. INCREASE Metoprolol succinate 25 mg and take 2 tablets (50 mg) once daily.   *If you need a refill on your cardiac medications before your next appointment, please call your pharmacy*   Lab Work: BMET today at the [facility] entrance  BMET on Monday 4/25 at the [facility] Entrance. No appointment is needed and check in at registration.   If you have labs (blood work) drawn today and your tests are completely normal, you will receive your results only by: Marland Kitchen MyChart Message (if you have MyChart) OR . A paper copy in the mail If you have any lab test that is abnormal or we need to change your treatment, we will call you to review the results.   Testing/Procedures: None   Follow-Up: At [facility], you and your health needs are our priority.  As part of our continuing mission to provide you with exceptional heart care, we have created designated Provider Care Teams.  These Care Teams include your primary Cardiologist (physician) and Advanced Practice Providers (APPs -  Physician Assistants and Nurse Practitioners) who all work together to provide you with the care you need, when you need it.   Your next appointment:   1 month(s)  The format for your next appointment:   In Person  Provider:   Ida Rogue, MD or Christell Faith, PA-C   Other Instructions We will call with results and further recommendations.

## 2021-02-13 ENCOUNTER — Other Ambulatory Visit: Payer: Self-pay | Admitting: *Deleted

## 2021-02-13 ENCOUNTER — Other Ambulatory Visit
Admission: RE | Admit: 2021-02-13 | Discharge: 2021-02-13 | Disposition: A | Discharge status: Home / Self Care | Payer: Medicare Other | Attending: Physician Assistant | Admitting: Physician Assistant

## 2021-02-13 DIAGNOSIS — I2510 Atherosclerotic heart disease of native coronary artery without angina pectoris: Secondary | ICD-10-CM | POA: Diagnosis not present

## 2021-02-13 DIAGNOSIS — I493 Ventricular premature depolarization: Secondary | ICD-10-CM | POA: Insufficient documentation

## 2021-02-13 DIAGNOSIS — I5020 Unspecified systolic (congestive) heart failure: Secondary | ICD-10-CM

## 2021-02-13 LAB — BASIC METABOLIC PANEL
Anion gap: 17 — ABNORMAL HIGH (ref 5–15)
BUN: 27 mg/dL — ABNORMAL HIGH (ref 8–23)
CO2: 28 mmol/L (ref 22–32)
Calcium: 9.7 mg/dL (ref 8.9–10.3)
Chloride: 90 mmol/L — ABNORMAL LOW (ref 98–111)
Creatinine, Ser: 0.91 mg/dL (ref 0.44–1.00)
GFR, Estimated: >60 mL/min (ref >60)
Glucose, Bld: 271 mg/dL — ABNORMAL HIGH (ref 70–99)
Potassium: 3.3 mmol/L — ABNORMAL LOW (ref 3.5–5.1)
Sodium: 135 mmol/L (ref 135–145)

## 2021-02-16 ENCOUNTER — Telehealth: Payer: Self-pay | Admitting: Pharmacist

## 2021-02-16 ENCOUNTER — Telehealth: Payer: Self-pay

## 2021-02-16 NOTE — Telephone Encounter (Signed)
Great! Did she leave a number? I've been trying to get her.

## 2021-02-16 NOTE — Telephone Encounter (Signed)
PT Spoke with [facility]

## 2021-02-16 NOTE — Telephone Encounter (Signed)
Copied from [place] [id number]. Topic: General - Other >> Feb 16, 2021  3:17 PM Pawlus, Brayton Layman A wrote: Reason for CRM: Pt stated she needed to speak with Almyra Free to give her information regarding her medications. Please advise.

## 2021-02-16 NOTE — Telephone Encounter (Signed)
Spoke with patient today and collected verbal income verification for patient assistance application. Will ask for expedited fill as patient only has 5 tablets remaining.  Scheduled patient for a face to face visit with me on 5/23 at 2 pm.

## 2021-02-16 NOTE — Telephone Encounter (Signed)
Spoke with patient today and collected verbal income verification for patient assistance application. Will ask for expedited fill as patient only has 5 tablets remaining.  Scheduled patient for a face to face visit with me on 5/23 at 2 pm.

## 2021-02-17 NOTE — Telephone Encounter (Signed)
Patient assistance application completed and faxed to [place] to call and request expedited fill.

## 2021-02-20 ENCOUNTER — Telehealth: Payer: Self-pay | Admitting: Pharmacist

## 2021-02-20 NOTE — Chronic Care Management (AMB) (Signed)
    Chronic Care Management Pharmacy Assistant   Name: Kim Buchanan  MRN: [id number] DOB: 1951-06-09  Reason for Encounter: Patient Assistance Application    Medications: Outpatient Encounter Medications as of 02/20/2021  Medication Sig  . aspirin 81 MG tablet Take 81 mg by mouth daily.   Marland Kitchen atorvastatin (LIPITOR) 10 MG tablet Take 1 tablet (10 mg total) by mouth at bedtime.  . blood glucose meter kit and supplies Dispense based on patient and insurance preference. Check fasting blood sugar once daily. (FOR ICD E11.69).  . dapagliflozin propanediol (FARXIGA) 10 MG TABS tablet Take 1 tablet (10 mg total) by mouth daily before breakfast.  . gabapentin (NEURONTIN) 300 MG capsule Take 1 capsule (300 mg total) by mouth 3 (three) times daily.  . isosorbide mononitrate (IMDUR) 30 MG 24 hr tablet TAKE 1 TABLET BY MOUTH  DAILY  . Lancets (ONETOUCH DELICA PLUS [id number]) MISC daily.   . metFORMIN (GLUCOPHAGE) 1000 MG tablet Take 1 tablet (1,000 mg total) by mouth 2 (two) times daily with a meal.  . metolazone (ZAROXOLYN) 2.5 MG tablet Take 1 tablet (2.5 mg total) by mouth daily.  . metoprolol succinate (TOPROL-XL) 25 MG 24 hr tablet Take 2 tablets (50 mg total) by mouth daily.  Glory Rosebush ULTRA test strip 1 each daily.  . pantoprazole (PROTONIX) 40 MG tablet Take 1 tablet (40 mg total) by mouth daily.  . polyethylene glycol powder (GLYCOLAX/MIRALAX) 17 GM/SCOOP powder Take 1-2 doses daily as needed for constipation/to prevent constipation  . potassium chloride (KLOR-CON) 10 MEQ tablet Take 1 tablet (10 mEq total) by mouth as directed. Take with your Torsemide.  Marland Kitchen QUEtiapine (SEROQUEL) 100 MG tablet TAKE 1 TABLET BY MOUTH AT  BEDTIME  . spironolactone (ALDACTONE) 25 MG tablet Take 0.5 tablets (12.5 mg total) by mouth daily.  Marland Kitchen torsemide (DEMADEX) 20 MG tablet Take 1 tablet (20 mg total) by mouth every other day. Take Potassium with Torsemide.   No facility-administered encounter medications on  file as of 02/20/2021.    Patient is approved for patient assistance for Farxiga from [place] from 02/20/21-10/21/21.Patient will receive medication in around 10-15 days at patients home.    Lizbeth Bark Clinical Pharmacist Assistant [phone]

## 2021-03-08 ENCOUNTER — Encounter: Payer: Self-pay | Admitting: Nurse Practitioner

## 2021-03-08 ENCOUNTER — Ambulatory Visit (INDEPENDENT_AMBULATORY_CARE_PROVIDER_SITE_OTHER): Payer: Medicare Other | Admitting: Nurse Practitioner

## 2021-03-08 ENCOUNTER — Other Ambulatory Visit: Payer: Self-pay

## 2021-03-08 VITALS — BP 121/70 | HR 95 | Temp 98.2°F | Wt 242.0 lb

## 2021-03-08 DIAGNOSIS — E1142 Type 2 diabetes mellitus with diabetic polyneuropathy: Secondary | ICD-10-CM | POA: Diagnosis not present

## 2021-03-08 DIAGNOSIS — E1159 Type 2 diabetes mellitus with other circulatory complications: Secondary | ICD-10-CM | POA: Diagnosis not present

## 2021-03-08 DIAGNOSIS — I5020 Unspecified systolic (congestive) heart failure: Secondary | ICD-10-CM | POA: Diagnosis not present

## 2021-03-08 DIAGNOSIS — I152 Hypertension secondary to endocrine disorders: Secondary | ICD-10-CM

## 2021-03-08 NOTE — Assessment & Plan Note (Signed)
Chronic, ongoing with BP at goal today.  Continue to collaborate with cardiology, recent notes and echo reviewed.  Continue current medication regimen and adjust as needed.  History of cough with ACE.  Recommend she monitor BP at home at least 3 days a week and document for providers.  Focus on DASH diet.  CMP today.  Return to office in 3 months. 

## 2021-03-08 NOTE — Progress Notes (Signed)
BP 121/70   Pulse 95   Temp 98.2 F (36.8 C)   Wt 242 lb (109.8 kg)   SpO2 94%   BMI 42.87 kg/m    Subjective:    Patient ID: Kim Buchanan, female    DOB: [birth year], 70 y.o.   MRN: [id number]  HPI: Kim Buchanan is a 70 y.o. female  Chief Complaint  Patient presents with  . Diabetes  . Congestive Heart Failure   DIABETES Last A1C in April 8.6%.  She continues on Metformin 1000 MG BID.  Patient was able to pick up [place] yesterday. Last GFR >60.   Hypoglycemic episodes:no Polydipsia/polyuria: no Visual disturbance: no Chest pain: no Paresthesias: no Glucose Monitoring: no  Accucheck frequency: Not Checking due to poor diet adherence  Fasting glucose:  Post prandial:  Evening:  Before meals: Taking Insulin?: no  Long acting insulin:  Short acting insulin: Blood Pressure Monitoring: rarely Retinal Examination: Not up to Date -- she reports having at [place] first of year 2022 Foot Exam: Done Today Pneumovax: Up to Date Influenza: Up to Date Aspirin: yes   COPD No current inhalers.  Last saw pulmonary on 07/12/20 with minimal obstruction and mild restriction noted on PFT by them -- she was given trial of Stiolto via samples -- this offered her no benefit.  Past smoker, she smoked from age 85 to age 3 -- smoked 1 1/2 PPD.  Had initial lung CA screening on 11/23/20 noting aortic atherosclerosis and hepatic steatosis. COPD status: stable Satisfied with current treatment?: yes Oxygen use: no Dyspnea frequency: at baseline, no worsening Cough frequency: occasional Rescue inhaler frequency:  none Limitation of activity: no Productive cough: none Last Spirometry: with pulmonary Pneumovax: Up to Date Influenza: Up to Date  HYPERTENSION / HYPERLIPIDEMIA/HF Patient states her SOB has improved.  Patient states the nausea is not as bad as it was at her last visit but she still has some. Abdomen is still distended and uncomfortable. Patient states her swelling has  improved since starting on the Metolazone and Torsemide.  Fatigue has improved some.  Followed by cardiology and last seen on 02/10/21-- next appointment 03/05/21.  Did not follow up to have BMP after bump in creatinine following initiation of metolazone and torsemide.  Patient states she is taking them every other day as instructed.    In past they did trial of Entresto which she did not tolerate and she was placed back on Losartan, now at 25 MG once daily.  At recent visit with them her Lasix was increased to 40 MG daily and K+ supplement added.  Their goal is to increase Losartan and Metoprolol overtime.   Echo on 12/20/20 noted EF 30-35%, LVH mild, + mild mitral valve regurgitation.  There was plan for CRT-D implant in futre. Continues on Imdur, Atorvastatin, Metoprolol XL, Lasix, Spironolactone, and ASA.  History of aortic atherosclerosis noted on past imaging.   She reports that she is not weighing herself daily due to her scale not working correctly. Changes between sleeping in recliner and bed, recently has been sleeping in recliner more -- breaths better, more comfortable and less orthopnea.    Has history of cough with ACE and ARB in past.   Satisfied with current treatment? yes Duration of hypertension: chronic BP monitoring frequency: rarely BP range:  BP medication side effects: no Duration of hyperlipidemia: chronic Cholesterol medication side effects: no Cholesterol supplements: none Medication compliance: good compliance Aspirin: yes Recent stressors: no Recurrent headaches: no Visual  changes: no Palpitations: no Dyspnea: increased over past days, she is unsure how many Chest pain: no Lower extremity edema: yes Dizzy/lightheaded: a little  INSOMNIA Continues on Seroquel 100 MG QHS started by previous PCP.  She reports being told she has sleep apnea, does not use CPAP at this time -- used years ago.    Pt is aware of risks of psychoactive medication use to include increased  sedation, respiratory suppression, falls, extrapyramidal movements,  dependence and cardiovascular events.  Pt would like to continue treatment as benefit determined to outweigh risk.  Duration: chronic Satisfied with sleep quality: no Difficulty falling asleep: no Difficulty staying asleep: yes Waking a few hours after sleep onset: yes Early morning awakenings: yes Daytime hypersomnolence: no Wakes feeling refreshed: no Good sleep hygiene: yes Apnea: yes Snoring: no Depressed/anxious mood: no Recent stress: no Restless legs/nocturnal leg cramps: no Chronic pain/arthritis: no History of sleep study: yes Treatments attempted: melatonin    Relevant past medical, surgical, family and social history reviewed and updated as indicated. Interim medical history since our last visit reviewed. Allergies and medications reviewed and updated.  Review of Systems  Constitutional: Positive for fatigue. Negative for activity change, appetite change, diaphoresis and fever.  Respiratory: Positive for shortness of breath. Negative for cough and chest tightness.   Cardiovascular: Negative for chest pain, palpitations and leg swelling.  Gastrointestinal: Positive for abdominal distention (mild reported) and nausea. Negative for abdominal pain, constipation, diarrhea and vomiting.  Endocrine: Negative for cold intolerance, heat intolerance, polydipsia, polyphagia and polyuria.  Neurological: Negative.   Psychiatric/Behavioral: Negative.     Per HPI unless specifically indicated above     Objective:    BP 121/70   Pulse 95   Temp 98.2 F (36.8 C)   Wt 242 lb (109.8 kg)   SpO2 94%   BMI 42.87 kg/m   Wt Readings from Last 3 Encounters:  03/08/21 242 lb (109.8 kg)  02/10/21 242 lb 8 oz (110 kg)  01/27/21 241 lb 6 oz (109.5 kg)    Physical Exam Vitals and nursing note reviewed.  Constitutional:      General: She is awake. She is not in acute distress.    Appearance: She is well-developed  and well-groomed. She is obese. She is not ill-appearing, toxic-appearing or diaphoretic.  HENT:     Head: Normocephalic.     Right Ear: Hearing normal.     Left Ear: Hearing normal.  Eyes:     General: Lids are normal.        Right eye: No discharge.        Left eye: No discharge.     Conjunctiva/sclera: Conjunctivae normal.     Pupils: Pupils are equal, round, and reactive to light.  Neck:     Thyroid: No thyromegaly.     Vascular: No carotid bruit or JVD.  Cardiovascular:     Rate and Rhythm: Normal rate and regular rhythm.     Heart sounds: Normal heart sounds. No murmur heard. No gallop.      Comments: Edema bilateral legs extending from ankle to mid-shin. Pulmonary:     Effort: Pulmonary effort is normal. No accessory muscle usage or respiratory distress.     Breath sounds: Normal breath sounds. No wheezing or rhonchi.     Comments: Mild SOB noted with talking and walking to lab. Abdominal:     General: Bowel sounds are normal. There is no distension (mild noted, soft).     Palpations: Abdomen is soft.  Tenderness: There is no abdominal tenderness.  Musculoskeletal:     Cervical back: Normal range of motion and neck supple.     Right lower leg: No edema.     Left lower leg: No edema.  Skin:    General: Skin is warm and dry.  Neurological:     Mental Status: She is alert and oriented to person, place, and time.  Psychiatric:        Attention and Perception: Attention normal.        Mood and Affect: Mood normal.        Speech: Speech normal.        Behavior: Behavior normal. Behavior is cooperative.        Thought Content: Thought content normal.   EKG My review and personal interpretation at Time: 1550   Indication: CHF  Rate: 96  Rhythm: sinus with frequent PVC noted Axis: normal Other: nonspecific st abn, old anteroseptal infarct -- similar in appearance to EKG on 01/04/21 with cardiology  Results for orders placed or performed during the hospital encounter of  20/25/42  Basic metabolic panel  Result Value Ref Range   Sodium 135 135 - 145 mmol/L   Potassium 3.3 (L) 3.5 - 5.1 mmol/L   Chloride 90 (L) 98 - 111 mmol/L   CO2 28 22 - 32 mmol/L   Glucose, Bld 271 (H) 70 - 99 mg/dL   BUN 27 (H) 8 - 23 mg/dL   Creatinine, Ser 0.91 0.44 - 1.00 mg/dL   Calcium 9.7 8.9 - 10.3 mg/dL   GFR, Estimated >60 >60 mL/min   Anion gap 17 (H) 5 - 15      Assessment & Plan:   Problem List Items Addressed This Visit      Cardiovascular and Mediastinum   Hypertension associated with diabetes ([place])    Chronic, ongoing with BP at goal today.  Continue to collaborate with cardiology, recent notes and echo reviewed.  Continue current medication regimen and adjust as needed.  History of cough with ACE.  Recommend she monitor BP at home at least 3 days a week and document for providers.  Focus on DASH diet.  CMP today.  Return to office in 3 months.      HFrEF (heart failure with reduced ejection fraction) (HCC) - Primary    Chronic, ongoing.  Has been followed closely by Cardiology over the last month.  Symptoms have improved. Weight consistent, however, not weighing at home. Restart Farxiga at 10 MG daily.  Continue with Torsemide and Metolazone as prescribed by Cardiology.  Recheck BMP today due to missing lab appointment with Cardiology after bump in creatinine.   - Reminded to call for an overnight weight gain of >2 pounds or a weekly weight weight of >5 pounds - not adding salt to his food and has been reading food labels. Reviewed the importance of keeping daily sodium intake to 2000mg  daily  - If worsening symptoms advised her to immediately go to ER. Return in 3 months for reevaluation due to improved symptoms and following closely with Cardiology.       Relevant Orders   Basic Metabolic Panel (BMET)     Endocrine   Type 2 diabetes mellitus with peripheral neuropathy (HCC)    Chronic, ongoing with A1C 8.6%.   Can not use ACE due to past history of cough.   Will continue Metformin 1000 MG BID and restart Farxiga 10 MG daily, this also benefits her HF.  Will consider GLP1 at  next visit if A1c is not well controlled with Farxiga and Metformin, which may benefit weight loss.  Recommend she monitor BS daily and document.  Focus on diabetes diet and regular activity.  Return to office in 3 months         Time: 30 minutes, >50% spent counseling/or care coordination   Follow up plan: Return in about 3 months (around 06/08/2021) for HTN, HLD, DM2 FU.

## 2021-03-08 NOTE — Assessment & Plan Note (Signed)
Chronic, ongoing.  Has been followed closely by Cardiology over the last month.  Symptoms have improved. Weight consistent, however, not weighing at home. Restart Farxiga at 10 MG daily.  Continue with Torsemide and Metolazone as prescribed by Cardiology.  Recheck BMP today due to missing lab appointment with Cardiology after bump in creatinine.   - Reminded to call for an overnight weight gain of >2 pounds or a weekly weight weight of >5 pounds - not adding salt to his food and has been reading food labels. Reviewed the importance of keeping daily sodium intake to 2000mg  daily  - If worsening symptoms advised her to immediately go to ER. Return in 3 months for reevaluation due to improved symptoms and following closely with Cardiology.

## 2021-03-08 NOTE — Assessment & Plan Note (Signed)
Chronic, ongoing with A1C 8.6%.   Can not use ACE due to past history of cough.  Will continue Metformin 1000 MG BID and restart Farxiga 10 MG daily, this also benefits her HF.  Will consider GLP1 at next visit if A1c is not well controlled with [place] and Metformin, which may benefit weight loss.  Recommend she monitor BS daily and document.  Focus on diabetes diet and regular activity.  Return to office in 3 months

## 2021-03-09 LAB — BASIC METABOLIC PANEL
BUN/Creatinine Ratio: 22 (ref 12–28)
BUN: 18 mg/dL (ref 8–27)
CO2: 21 mmol/L (ref 20–29)
Calcium: 9.4 mg/dL (ref 8.7–10.3)
Chloride: 99 mmol/L (ref 96–106)
Creatinine, Ser: 0.82 mg/dL (ref 0.57–1.00)
Glucose: 197 mg/dL — ABNORMAL HIGH (ref 65–99)
Potassium: 4.6 mmol/L (ref 3.5–5.2)
Sodium: 141 mmol/L (ref 134–144)
eGFR: 78 mL/min/{1.73_m2} (ref >59)

## 2021-03-09 NOTE — Progress Notes (Signed)
Hi Jilene.  It was a pleasure meeting you yesterday. Your kidney function has returned to normal which is great news.  Continue with your medication regimen as per Cardiology.  Make sure to keep your appointment with them.  I will see you in a couple of months.

## 2021-03-10 ENCOUNTER — Telehealth: Payer: Self-pay | Admitting: Pharmacist

## 2021-03-10 NOTE — Progress Notes (Signed)
Error- see telephone note

## 2021-03-10 NOTE — Progress Notes (Signed)
Cardiology Office Note    Date:  03/13/2021   ID:  Kim Buchanan, DOB [birth year], MRN [id number]  PCP:  [name], NP  Cardiologist:  Ida Rogue, MD  Electrophysiologist:  [name], MD   Chief Complaint: Follow-up  History of Present Illness:   Kim Buchanan is a 70 y.o. female with history of nonobstructive CAD, HFrEF secondary toNICM,COPD secondary to tobacco use quitting in 11/2018,restrictive lung disease,DM2, HLD, chronic dyspnea, obesity, untreated sleep apnea, GERD, anxiety, and depression who presents for follow-up of her cardiomyopathy.  She was previously followed by Dr. Jenita Seashore underwent echo in 03/2019 which showed normal LV systolic function. Stress testing was undertaken in the setting of ongoing dyspnea which showed anterior ischemia. Following this, there was recommendation for coronary CTA though the patient did not follow-up and was subsequently evaluated by pulmonology. In this setting,she was referred to Dr. Rockey Situ who saw the patient in 07/2019 for follow-up of the above prior cardiac testing. Records were not available at that time, therefore, sheunderwent repeat echo in 08/2019,which revealed an EF of 25 to 30%, global hypokinesis, and grade 1 diastolic dysfunction. There was no significant valvular disease. Given her new cardiomyopathy, she underwent diagnostic cardiac cath in 08/2019 which revealed a 70% stenosis in the proximal RCA. iFR was normal at 0.97. Medical therapy was recommended. Of note, LV gram was performed and showed an EF of 50 to 55%. Right heart cath showed normal filling pressures. She was seen in the office in 09/2019 and was doing well from a cardiac perspective. She was noted to have resolution of cough following discontinuation of losartan. She was started on low-dose carvedilol and referred to cardiopulmonary rehab.   She was seen in the office on 05/19/2020,and noted stable symptoms over the past  couple of years. She followed up with pulmonology with lung disease felt to be less likely the etiology of her symptoms, with mild restrictive physiology noted on PFTs. She was unable to participate in cardiopulmonary rehab secondary to dyspnea. It was noted she did not tolerate Coreg secondary to cough and self discontinued it. She underwent echo, per pulmonology on 05/30/2020, which showed an EF of 30-35%, Gr2DD, normal RV cavity size, and mild MR.She was scheduled for coronary CTA on 06/09/2020, whichwas unable to be performed secondary to heart rates.She was able to undergo calcium scoring with a calcium score of 2.67 which placed the patient in the 49th percentile for age and sex matched control.She was seen in follow up on 8/20/2021and continued to feel about the same with ongoing exertional dyspnea. She subsequently underwent coronary CTA on 06/30/2020 which showed nonobstructive CAD as outlined below. In this setting, she was started on losartan to further optimize her medical therapy. She was seen by pulmonology on 07/12/2020,in follow up with noted minimal obstructive, if any, and mild restrictive lung disease. She was seen in the office on 07/20/2020 and continued to feel about the same with stable exertional dyspnea that has been present for the past several years. She did not notice any changes in her dyspnea following the addition of inhaler therapy by pulmonology. With continued PVCs, Toprol-XL was titrated to 37.5 mg daily. She was also started on spironolactone 12.5 mg.She was seen in the office on 08/22/2020 noting a 3-day history of increased chest congestion, shortness of breath, and nonproductive cough. Her weight was stable. Lasix 20 mg daily for 5 days was added. I last saw her on 09/07/2020 at which time she noted an improvement  in her dyspnea while on scheduled low-dose Lasix 20 mg daily with a return in her dyspnea and exertional fatigue while being off this medication. In  this setting, she was placed on scheduled low-dose Lasix 20 mg daily.  She was seen by Dr. Quentin Ore on 10/05/2020 for further evaluation of her NICM and left bundle branch block. She was placed on Entresto with recommendation to follow-up echo in approximately 3 months. If her cardiomyopathy persisted CRT-D was recommended. She was also referred to the advanced heart failure clinic in [place].  She was seen in 10/2020 for follow-up and it was noted she did not tolerate Entresto secondary to cough and self discontinued this medication. She had concerns about the affordability of [name], though with patient assistance paperwork this was subsequently approved. She was mildly volume up and was advised to increase Lasix to 40 mg daily for 3 days followed by 20 mg daily thereafter. She was placed back on losartan.  She was seen by EP in 12/2020 with continued volume overload and shortness of breath. Echo in 12/2020 showed a persistent cardiomyopathy with an EF of 30 to 35%, global hypokinesis, indeterminate LV diastolic function parameters, normal RV systolic function and ventricular cavity size, and mild mitral valve regurgitation. Her weight was up 4 pounds when compared to her visit in 10/2020. Her Lasix was increased to 40 mg daily and losartan was titrated to 25 mg daily.  She was seen in the office on 01/24/2021 by Marrianne Mood, PA-C with a weight that was down 1 pound when compared to her visit in 12/2020. She continued to note shortness of breath and reported having recently been seen by her PCP with chest x-ray without evidence of pleural effusions or vascular congestion. She was advised at PCP's office toincreaseLasixto60 mg brieflywith noted slight improvement in symptoms after 1 dose. At her visit with cardiology, she indicated she had not taken her Lasix for several days and was taking this on a as needed basis rather than the recommended 40 mg daily. BP was soft at 100/76. Her  losartan was held on 4/5 secondary to soft BP, and she was placed on torsemide 20 mg daily in place of Lasix 40 mg daily.   I saw her in follow up on 01/27/2021, at which time she reported feeling slightly better.  Her weight was down 3 pounds from 4/5 visit.  She was adherent to torsemide 20 mg daily.  She did note a slight improvement in symptoms.  She had also cut back on her fluid consumption.  Follow up labs obtained at that time showed an up-trending BUN/SCr.  In this setting, she was advised to hold torsemide x 1 day then resume at 20 mg every other day.    I last saw her on 02/10/2021, at which time she felt about the same as she has over her previous visits.  She was adherent to torsemide 20 mg every other day.  She had increased her fluid intake.  Her weight was up 1 pound when compared to her visit on 4/8.  She did continue to appear mildly volume up.  Metolazone 2.5 mg daily for 2 days was added along with extra KCl to take with her torsemide.  She comes in today accompanied by her husband and continues to feel about the same as she has over her past several visits.  She did undergo a trial of metolazone 2.5 mg daily to be taken with torsemide however with this she did not note a  vigorous urine output.  Her weight is down 3 pounds when compared to her last clinic visit.  However, her dyspnea remains largely unchanged.  She has stable two-pillow orthopnea and continues to feel like she is holding onto fluid in her abdomen.  No lower extremity swelling.  She is drinking approximately 2 L of fluid per day and is watching her salt intake.  She is adherent and tolerating all of her cardiac medications.  No angina, palpitations, dizziness, presyncope, or syncope.   Labs independently reviewed: 02/2021 - BUN 18, serum creatinine 0.82, potassium 4.6 01/2021-TC 79, TG 215, HDL 31, LDL 15, albumin 4.0, AST 76, ALT 116, A1c 8.6 12/2020-magnesium 2.1 09/2020-TSH normal 11/2019-Hgb 13.2, PLT  306    Past Medical History:  Diagnosis Date  . Anxiety   . Arthritis    lower back  . COPD (chronic obstructive pulmonary disease) ([place])   . Depression   . Diabetes mellitus without complication ([place])   . GERD (gastroesophageal reflux disease)   . Hyperlipidemia   . Morbid obesity ([place])   . NICM (nonischemic cardiomyopathy) ([place])    a. 03/2019 Echo: Nl LVEF; b. 09/04/2019 Echo: EF 25-30%, Gr1 DD, glob HK, nl RV fxn; c. 09/11/2019 Cath: nonobs LAD/RCA dzs. EF 50-55%. RA 10, RV 29/8(11), PA 27/16(20), PCWP 12. CO/CI 5.04/2.51.  Marland Kitchen Nonobstructive CAD (coronary artery disease)    a. 03/2019 [facility]): EF 63%, ant ischemia; b. 08/2019 Cath: LM nl, LAD 57m RCA 70p (iFR nl @ 0.97), 50p. EF 50-55%-->Med Rx.  . Osteoporosis   . Panic attack   . PTSD (post-traumatic stress disorder)   . Rectal fistula   . Sleep apnea    a. Prev wore CPAP but lost insurance and hasn't been using since (now has insurance).  . Wears dentures    full upper    Past Surgical History:  Procedure Laterality Date  . ABDOMINAL HYSTERECTOMY    . CARDIAC CATHETERIZATION    . COLONOSCOPY  2014  . COLONOSCOPY WITH PROPOFOL N/A 10/01/2016   Procedure: COLONOSCOPY WITH PROPOFOL;  Surgeon: DLucilla Lame MD;  [place] [facility]  Service: Endoscopy;  Laterality: N/A;  . CORONARY PRESSURE WIRE/FFR WITH 3D MAPPING N/A 09/11/2019   Procedure: Coronary Pressure Wire/FFR w/3D Mapping;  Surgeon: ENelva Bush MD;  [facility];  Service: Cardiovascular;  Laterality: N/A;  . CYSTOCELE REPAIR N/A 12/17/2016   Procedure: ANTERIOR REPAIR (CYSTOCELE);  Surgeon: MBrayton Mars MD;  Location: [facility];  Service: Gynecology;  Laterality: N/A;  . ESOPHAGOGASTRODUODENOSCOPY (EGD) WITH PROPOFOL N/A 08/11/2019   Procedure: ESOPHAGOGASTRODUODENOSCOPY (EGD) WITH PROPOFOL;  Surgeon: WLucilla Lame MD;  Location: [facility];  Service: Endoscopy;  Laterality: N/A;  . POLYPECTOMY  10/01/2016    Procedure: POLYPECTOMY;  Surgeon: DLucilla Lame MD;  [place] [facility]  Service: Endoscopy;;  . RECTAL SURGERY  06/28/2015   Rectal prolapse, laparoscopic rectopexy the coldCharmont, MD; [facility]  . RIGHT/LEFT HEART CATH AND CORONARY ANGIOGRAPHY Bilateral 09/11/2019   Procedure: RIGHT/LEFT HEART CATH AND CORONARY ANGIOGRAPHY;  Surgeon: GMinna Merritts MD;  [facility];  Service: Cardiovascular;  Laterality: Bilateral;  . TUBAL LIGATION    . VAGINAL HYSTERECTOMY Bilateral 12/17/2016   Procedure: HYSTERECTOMY VAGINAL WITH BILATERAL SALPINGO OOPHERECTOMY;  Surgeon: MBrayton Mars MD;  Location: [facility];  Service: Gynecology;  Laterality: Bilateral;    Current Medications: Current Meds  Medication Sig  . aspirin 81 MG tablet Take 81 mg by mouth daily.   .Marland Kitchen  atorvastatin (LIPITOR) 10 MG tablet Take 1 tablet (10 mg total) by mouth at bedtime.  . blood glucose meter kit and supplies Dispense based on patient and insurance preference. Check fasting blood sugar once daily. (FOR ICD E11.69).  . dapagliflozin propanediol (FARXIGA) 10 MG TABS tablet Take 1 tablet (10 mg total) by mouth daily before breakfast.  . gabapentin (NEURONTIN) 300 MG capsule Take 1 capsule (300 mg total) by mouth 3 (three) times daily.  . isosorbide mononitrate (IMDUR) 30 MG 24 hr tablet TAKE 1 TABLET BY MOUTH  DAILY  . Lancets (ONETOUCH DELICA PLUS [id number]) MISC daily.   . metFORMIN (GLUCOPHAGE) 1000 MG tablet Take 1 tablet (1,000 mg total) by mouth 2 (two) times daily with a meal.  . metolazone (ZAROXOLYN) 2.5 MG tablet Take 1 tablet (2.5 mg total) by mouth daily.  . metoprolol succinate (TOPROL-XL) 25 MG 24 hr tablet Take 2 tablets (50 mg total) by mouth daily.  Glory Rosebush ULTRA test strip 1 each daily.  . pantoprazole (PROTONIX) 40 MG tablet Take 1 tablet (40 mg total) by mouth daily.  . polyethylene glycol powder (GLYCOLAX/MIRALAX) 17 GM/SCOOP powder Take 1-2 doses daily as needed for  constipation/to prevent constipation  . potassium chloride (KLOR-CON) 10 MEQ tablet Take 1 tablet (10 mEq total) by mouth as directed. Take with your Torsemide.  Marland Kitchen QUEtiapine (SEROQUEL) 100 MG tablet TAKE 1 TABLET BY MOUTH AT  BEDTIME  . torsemide (DEMADEX) 20 MG tablet Take 1 tablet (20 mg total) by mouth every other day. Take Potassium with Torsemide.    Allergies:   Lisinopril   Social History   Socioeconomic History  . Marital status: Married    Spouse name: Paediatric nurse  . Number of children: 1  . Years of education: Not on file  . Highest education level: 9th grade  Occupational History    Employer: DISABLED    Comment: for panic attacks  Tobacco Use  . Smoking status: Former Smoker    Packs/day: 1.50    Years: 50.00    Pack years: 75.00    Types: Cigarettes    Quit date: 11/29/2018    Years since quitting: 2.2  . Smokeless tobacco: Never Used  . Tobacco comment: started at age 62  Vaping Use  . Vaping Use: Never used  Substance and Sexual Activity  . Alcohol use: No    Alcohol/week: 0.0 standard drinks  . Drug use: No  . Sexual activity: Not Currently  Other Topics Concern  . Not on file  Social History Narrative   Pt had one son, who died when was 73 YO in a MVA.   Social Determinants of Health   Financial Resource Strain: Low Risk   . Difficulty of Paying Living Expenses: Not hard at all  Food Insecurity: No Food Insecurity  . Worried About Charity fundraiser in the Last Year: Never true  . Ran Out of Food in the Last Year: Never true  Transportation Needs: No Transportation Needs  . Lack of Transportation (Medical): No  . Lack of Transportation (Non-Medical): No  Physical Activity: Inactive  . Days of Exercise per Week: 0 days  . Minutes of Exercise per Session: 0 min  Stress: No Stress Concern Present  . Feeling of Stress : Not at all  Social Connections: Not on file     Family History:  The patient's family history includes Cancer in her sister;  Diabetes in her brother, brother, mother, sister, and sister; Healthy in her sister;  Heart disease in her brother and father; Hypertension in her father; Lymphoma in her brother and brother. There is no history of Breast cancer, Ovarian cancer, or Colon cancer.  ROS:   Review of Systems  Constitutional: Positive for malaise/fatigue. Negative for chills, diaphoresis, fever and weight loss.  HENT: Negative for congestion.   Eyes: Negative for discharge and redness.  Respiratory: Positive for shortness of breath. Negative for cough, sputum production and wheezing.   Cardiovascular: Positive for orthopnea. Negative for chest pain, palpitations, claudication, leg swelling and PND.       Stable two-pillow orthopnea  Gastrointestinal: Negative for abdominal pain, heartburn, nausea and vomiting.       Abdominal distention  Musculoskeletal: Negative for falls and myalgias.  Skin: Negative for rash.  Neurological: Positive for weakness. Negative for dizziness, tingling, tremors, sensory change, speech change, focal weakness and loss of consciousness.  Endo/Heme/Allergies: Does not bruise/bleed easily.  Psychiatric/Behavioral: Negative for substance abuse. The patient is not nervous/anxious.   All other systems reviewed and are negative.    EKGs/Labs/Other Studies Reviewed:    Studies reviewed were summarized above. The additional studies were reviewed today:  Coronary CTA 06/30/2020: Aorta: Normal size. Mild aortic atherosclerosis. No dissection.  Aortic Valve: Trileaflet. No calcifications.  Coronary Arteries: Normal coronary origin. Right dominance.  RCA is a large dominant artery that gives rise to PDA and PLVB. There is mild (25-49%) soft plaque stenosis in the proximal to mid vessel followed by mild (25-49%) mixed plaque stenosis.  Left main is a large artery that gives rise to LAD and LCX arteries.  LAD is a large vessel that gives rise to medium size D1 and D2 and small  D3; there is long, soft plaque stenosis in the mid vessel mild (25-49%) followed by moderate (50-69%).  LCX is a non-dominant artery that gives rise to one large OM1 branch. There is no plaque.  Other findings:  Normal pulmonary vein drainage into the left atrium.  Normal let atrial appendage without a thrombus.  Normal size of the pulmonary artery.  IMPRESSION: 1. Coronary calcium score of 39.3. This was 41 percentile for age and sex matched control.  2. Normal coronary origin with right dominance.  3. Moderate (50-69%) soft plaque stenosis in the mid LAD; mild (25-49%) soft and mixed plaque stenoses in the proximal to mid RCA.  4. Study will be sent for FFR. __________  FFR 07/01/2020: 1. Left Main: findings 0.99, 0.99  2. LAD: findings 0.99, 0.97 0.92  3. OM: Findings 0.98, 0.88 0.84  4. RCA: findings 0.97, 0.81 0.84  IMPRESSION: FFR not suggestive of significant obstructive disease. __________  Coronary CTA 06/09/2020: Unable to be performed secondary to heart rate -Calcium score 2.67 which places the patient in the 49th percentile for age and sex matched control __________   2D echo 05/30/2020: 1. Left ventricular ejection fraction, by estimation, is 30 to 35%. The  left ventricle has moderately decreased function. Left ventricular  endocardial border not optimally defined to evaluate regional wall motion.  Left ventricular diastolic parameters  are consistent with Grade II diastolic dysfunction (pseudonormalization).  2. Right ventricular systolic function was not well visualized. The right  ventricular size is normal. Tricuspid regurgitation signal is inadequate  for assessing PA pressure.  3. The mitral valve is normal in structure. Mild mitral valve  regurgitation. No evidence of mitral stenosis.  4. The aortic valve was not well visualized. Aortic valve regurgitation  is not visualized. No aortic stenosis is present.  5. The  inferior vena cava is normal in size with greater than 50%  respiratory variability, suggesting right atrial pressure of 3 mmHg.  6. Challenging image quality. __________  2D echo 08/2019: 1. Left ventricular ejection fraction, by visual estimation, is 25 to  30%. The left ventricle has moderate to severely decreased function. There  is no left ventricular hypertrophy.  2. Left ventricular diastolic parameters are consistent with Grade I  diastolic dysfunction (impaired relaxation).  3. The left ventricle demonstrates global hypokinesis.  4. Global right ventricle has normal systolic function.The right  ventricular size is normal. No increase in right ventricular wall  thickness.  5. Left atrial size was normal.  6. TR signal is inadequate for assessing pulmonary artery systolic  Pressure. __________  [facility] 08/2019: Coronary angiography:  Coronary dominance: Right  Left mainstem: Large vessel that bifurcates into the LAD and left circumflex, no significant disease noted  Left anterior descending (LAD): Large vessel that extends to the apical region, diagonal branch 2 of moderate size, mild proximal LAD disease estimated at 30%  Left circumflex (LCx): Large vessel with OM branch 2, no significant disease noted  Right coronary artery (RCA): Right dominant vessel with PL and PDA, 70% proximal RCA disease followed by sequential 50% lesion proximal to mid vessel  Left ventriculography: Left ventricular systolic function is normal, LVEF is estimated at 50 to 55%, there is no significant mitral regurgitation , no significant aortic valve stenosis  Right heart pressures RA 10 RV 29/8, 11 PA 27/16 mean 20 Wedge 12 Cardiac output 5.04 Cardiac index 2.51  Final Conclusions:  Single-vessel disease RCA Discussed with interventional cardiology, they will perform FFR to determine if intervention needed Right heart pressures are normal   Recommendations:  We will  defer coronary intervention to interventional cardiology Needs weight loss, management of COPD Management of hyperlipidemia __________  iFR 08/2019:  Conclusions: 1. Single vessel coronary with sequential 70% and 50% proximal and mid RCA stenoses that are not hemodynamically significant (iFR = 0.97). See Dr. Donivan Scull diagnostic catheterization report for further details of right/left heart catheterization.  Recommendations: 1. Medical therapy and risk factor modification to prevent progression of disease.    EKG:  EKG is not ordered today.    Recent Labs: 10/17/2020: TSH 2.440 01/23/2021: ALT 116 01/24/2021: BNP 67.0 01/27/2021: Magnesium 2.0 03/08/2021: BUN 18; Creatinine, Ser 0.82; Potassium 4.6; Sodium 141  Recent Lipid Panel    Component Value Date/Time   CHOL 79 (L) 01/23/2021 1511   TRIG 215 (H) 01/23/2021 1511   HDL 31 (L) 01/23/2021 1511   CHOLHDL 1.5 11/30/2019 1135   CHOLHDL 1.7 01/01/2018 0000   VLDL 29 08/06/2016 0824   LDLCALC 15 01/23/2021 1511   LDLCALC 29 01/01/2018 0000    PHYSICAL EXAM:    VS:  BP 114/76   Pulse (!) 52   Ht '5\' 3"'  (1.6 m)   Wt 239 lb (108.4 kg)   BMI 42.34 kg/m   BMI: Body mass index is 42.34 kg/m.  Physical Exam Vitals reviewed.  Constitutional:      Appearance: She is well-developed.  HENT:     Head: Normocephalic and atraumatic.  Eyes:     General:        Right eye: No discharge.        Left eye: No discharge.  Neck:     Comments: JVD difficult to assess secondary to body habitus Cardiovascular:     Rate and Rhythm: Normal rate and regular rhythm.  Pulses: No midsystolic click and no opening snap.          Posterior tibial pulses are 2+ on the right side and 2+ on the left side.     Heart sounds: Normal heart sounds, S1 normal and S2 normal. Heart sounds not distant. No murmur heard. No friction rub.  Pulmonary:     Effort: Pulmonary effort is normal. No respiratory distress.     Breath sounds: Normal breath  sounds. No decreased breath sounds, wheezing or rales.  Chest:     Chest wall: No tenderness.  Abdominal:     General: There is distension.     Palpations: Abdomen is soft.     Tenderness: There is no abdominal tenderness.  Musculoskeletal:     Cervical back: Normal range of motion.  Skin:    General: Skin is warm and dry.     Nails: There is no clubbing.  Neurological:     Mental Status: She is alert and oriented to person, place, and time.  Psychiatric:        Speech: Speech normal.        Behavior: Behavior normal.        Thought Content: Thought content normal.        Judgment: Judgment normal.     Wt Readings from Last 3 Encounters:  03/13/21 239 lb (108.4 kg)  03/08/21 242 lb (109.8 kg)  02/10/21 242 lb 8 oz (110 kg)     ASSESSMENT & PLAN:   1. HFrEF secondary to NICM with underlying LBBB: She has NYHA class III symptoms.  Volume status is difficult to assess secondary to body habitus.  Symptoms of underlying chronic dyspnea remain largely unchanged.  She did not note vigorous urine output or significant change in symptoms following brief trial of metolazone with torsemide.  Follow-up labs were stable as outlined above.  Historically, we have had difficulty in titrating her diuretic secondary to AKI.  She has been intolerant to Entresto secondary to cough.  Losartan has previously been held secondary to relative hypotension.  She remains on Farxiga, Toprol-XL, spironolactone, and torsemide.  Relative hypotension precludes escalation of GDMT at this time.  We will plan for RHC to better assess her volume status to help gauge ongoing diuresis given persistent dyspnea.  We will not pursue LHC at this time given stable longstanding symptoms, national contrast shortage and recent coronary CTA less than 1 year ago showing nonobstructive disease.  Further recommendations regarding diuresis pending RHC.  Continue to optimize medical therapy as vital signs and labs allow with plans to  pursue a repeat limited echo down the road.  With regards to her cardiomyopathy and underlying left bundle branch block she has been evaluated by EP with current plans to continue optimization of medical therapy and diuresis for now.  CHF education.  2. Nonobstructive CAD: No symptoms of chest pain.  Prior noted moderate RCA disease with normal iFR in 08/2019 with subsequent coronary CTA in 06/2020 showing nonobstructive disease.  Continue risk factor modification and medical therapy including aspirin, atorvastatin, Toprol-XL, and Imdur.  No indication for further ischemic testing at this time.  3. PVCs: Quiescent.  Continue Toprol-XL as above.  4. HLD: LDL of 15 in 01/2021.  She remains on atorvastatin with history of fatty liver disease.  5. COPD/restrictive lung disease: Stable.  Followed by pulmonology.  6. Sleep apnea: She does have a history of underlying sleep apnea with prior CPAP usage however she no longer has her CPAP  machine given loss of insurance.  Recommend she discuss this further with pulmonology.  Suspect underlying obesity, OSA, and possible OHS are also contributing to her overall presentation.  7. Morbid obesity with deconditioning: Weight is down 3 pounds when compared to last visit.  Continued weight loss is advised.  Underlying obesity with physical deconditioning continue to contribute to her overall presentation.  8. Diabetes: Poorly controlled.  Follow-up with PCP as directed.  Disposition: F/u with Dr. Rockey Situ or an APP in 1 to 2-week, and EP as directed.   Medication Adjustments/Labs and Tests Ordered: Current medicines are reviewed at length with the patient today.  Concerns regarding medicines are outlined above. Medication changes, Labs and Tests ordered today are summarized above and listed in the Patient Instructions accessible in Encounters.   Signed, Christell Faith, PA-C 03/13/2021 8:53 AM     [facility] [address] [place] [address]  [place], [place] [address] [phone]

## 2021-03-10 NOTE — H&P (View-Only) (Signed)
Cardiology Office Note    Date:  03/13/2021   ID:  RUFUS CYPERT, DOB [birth year], MRN [id number]  PCP:  [name], NP  Cardiologist:  Ida Rogue, MD  Electrophysiologist:  [name], MD   Chief Complaint: Follow-up  History of Present Illness:   Kim Buchanan is a 70 y.o. female with history of nonobstructive CAD, HFrEF secondary toNICM,COPD secondary to tobacco use quitting in 11/2018,restrictive lung disease,DM2, HLD, chronic dyspnea, obesity, untreated sleep apnea, GERD, anxiety, and depression who presents for follow-up of her cardiomyopathy.  She was previously followed by Dr. Jenita Seashore underwent echo in 03/2019 which showed normal LV systolic function. Stress testing was undertaken in the setting of ongoing dyspnea which showed anterior ischemia. Following this, there was recommendation for coronary CTA though the patient did not follow-up and was subsequently evaluated by pulmonology. In this setting,she was referred to Dr. Rockey Situ who saw the patient in 07/2019 for follow-up of the above prior cardiac testing. Records were not available at that time, therefore, sheunderwent repeat echo in 08/2019,which revealed an EF of 25 to 30%, global hypokinesis, and grade 1 diastolic dysfunction. There was no significant valvular disease. Given her new cardiomyopathy, she underwent diagnostic cardiac cath in 08/2019 which revealed a 70% stenosis in the proximal RCA. iFR was normal at 0.97. Medical therapy was recommended. Of note, LV gram was performed and showed an EF of 50 to 55%. Right heart cath showed normal filling pressures. She was seen in the office in 09/2019 and was doing well from a cardiac perspective. She was noted to have resolution of cough following discontinuation of losartan. She was started on low-dose carvedilol and referred to cardiopulmonary rehab.   She was seen in the office on 05/19/2020,and noted stable symptoms over the past  couple of years. She followed up with pulmonology with lung disease felt to be less likely the etiology of her symptoms, with mild restrictive physiology noted on PFTs. She was unable to participate in cardiopulmonary rehab secondary to dyspnea. It was noted she did not tolerate Coreg secondary to cough and self discontinued it. She underwent echo, per pulmonology on 05/30/2020, which showed an EF of 30-35%, Gr2DD, normal RV cavity size, and mild MR.She was scheduled for coronary CTA on 06/09/2020, whichwas unable to be performed secondary to heart rates.She was able to undergo calcium scoring with a calcium score of 2.67 which placed the patient in the 49th percentile for age and sex matched control.She was seen in follow up on 8/20/2021and continued to feel about the same with ongoing exertional dyspnea. She subsequently underwent coronary CTA on 06/30/2020 which showed nonobstructive CAD as outlined below. In this setting, she was started on losartan to further optimize her medical therapy. She was seen by pulmonology on 07/12/2020,in follow up with noted minimal obstructive, if any, and mild restrictive lung disease. She was seen in the office on 07/20/2020 and continued to feel about the same with stable exertional dyspnea that has been present for the past several years. She did not notice any changes in her dyspnea following the addition of inhaler therapy by pulmonology. With continued PVCs, Toprol-XL was titrated to 37.5 mg daily. She was also started on spironolactone 12.5 mg.She was seen in the office on 08/22/2020 noting a 3-day history of increased chest congestion, shortness of breath, and nonproductive cough. Her weight was stable. Lasix 20 mg daily for 5 days was added. I last saw her on 09/07/2020 at which time she noted an improvement  in her dyspnea while on scheduled low-dose Lasix 20 mg daily with a return in her dyspnea and exertional fatigue while being off this medication. In  this setting, she was placed on scheduled low-dose Lasix 20 mg daily.  She was seen by Dr. Quentin Ore on 10/05/2020 for further evaluation of her NICM and left bundle branch block. She was placed on Entresto with recommendation to follow-up echo in approximately 3 months. If her cardiomyopathy persisted CRT-D was recommended. She was also referred to the advanced heart failure clinic in [place].  She was seen in 10/2020 for follow-up and it was noted she did not tolerate Entresto secondary to cough and self discontinued this medication. She had concerns about the affordability of [name], though with patient assistance paperwork this was subsequently approved. She was mildly volume up and was advised to increase Lasix to 40 mg daily for 3 days followed by 20 mg daily thereafter. She was placed back on losartan.  She was seen by EP in 12/2020 with continued volume overload and shortness of breath. Echo in 12/2020 showed a persistent cardiomyopathy with an EF of 30 to 35%, global hypokinesis, indeterminate LV diastolic function parameters, normal RV systolic function and ventricular cavity size, and mild mitral valve regurgitation. Her weight was up 4 pounds when compared to her visit in 10/2020. Her Lasix was increased to 40 mg daily and losartan was titrated to 25 mg daily.  She was seen in the office on 01/24/2021 by Marrianne Mood, PA-C with a weight that was down 1 pound when compared to her visit in 12/2020. She continued to note shortness of breath and reported having recently been seen by her PCP with chest x-ray without evidence of pleural effusions or vascular congestion. She was advised at PCP's office toincreaseLasixto60 mg brieflywith noted slight improvement in symptoms after 1 dose. At her visit with cardiology, she indicated she had not taken her Lasix for several days and was taking this on a as needed basis rather than the recommended 40 mg daily. BP was soft at 100/76. Her  losartan was held on 4/5 secondary to soft BP, and she was placed on torsemide 20 mg daily in place of Lasix 40 mg daily.   I saw her in follow up on 01/27/2021, at which time she reported feeling slightly better.  Her weight was down 3 pounds from 4/5 visit.  She was adherent to torsemide 20 mg daily.  She did note a slight improvement in symptoms.  She had also cut back on her fluid consumption.  Follow up labs obtained at that time showed an up-trending BUN/SCr.  In this setting, she was advised to hold torsemide x 1 day then resume at 20 mg every other day.    I last saw her on 02/10/2021, at which time she felt about the same as she has over her previous visits.  She was adherent to torsemide 20 mg every other day.  She had increased her fluid intake.  Her weight was up 1 pound when compared to her visit on 4/8.  She did continue to appear mildly volume up.  Metolazone 2.5 mg daily for 2 days was added along with extra KCl to take with her torsemide.  She comes in today accompanied by her husband and continues to feel about the same as she has over her past several visits.  She did undergo a trial of metolazone 2.5 mg daily to be taken with torsemide however with this she did not note a  vigorous urine output.  Her weight is down 3 pounds when compared to her last clinic visit.  However, her dyspnea remains largely unchanged.  She has stable two-pillow orthopnea and continues to feel like she is holding onto fluid in her abdomen.  No lower extremity swelling.  She is drinking approximately 2 L of fluid per day and is watching her salt intake.  She is adherent and tolerating all of her cardiac medications.  No angina, palpitations, dizziness, presyncope, or syncope.   Labs independently reviewed: 02/2021 - BUN 18, serum creatinine 0.82, potassium 4.6 01/2021-TC 79, TG 215, HDL 31, LDL 15, albumin 4.0, AST 76, ALT 116, A1c 8.6 12/2020-magnesium 2.1 09/2020-TSH normal 11/2019-Hgb 13.2, PLT  306    Past Medical History:  Diagnosis Date  . Anxiety   . Arthritis    lower back  . COPD (chronic obstructive pulmonary disease) ([place])   . Depression   . Diabetes mellitus without complication ([place])   . GERD (gastroesophageal reflux disease)   . Hyperlipidemia   . Morbid obesity ([place])   . NICM (nonischemic cardiomyopathy) ([place])    a. 03/2019 Echo: Nl LVEF; b. 09/04/2019 Echo: EF 25-30%, Gr1 DD, glob HK, nl RV fxn; c. 09/11/2019 Cath: nonobs LAD/RCA dzs. EF 50-55%. RA 10, RV 29/8(11), PA 27/16(20), PCWP 12. CO/CI 5.04/2.51.  Marland Kitchen Nonobstructive CAD (coronary artery disease)    a. 03/2019 [facility]): EF 63%, ant ischemia; b. 08/2019 Cath: LM nl, LAD 73m RCA 70p (iFR nl @ 0.97), 50p. EF 50-55%-->Med Rx.  . Osteoporosis   . Panic attack   . PTSD (post-traumatic stress disorder)   . Rectal fistula   . Sleep apnea    a. Prev wore CPAP but lost insurance and hasn't been using since (now has insurance).  . Wears dentures    full upper    Past Surgical History:  Procedure Laterality Date  . ABDOMINAL HYSTERECTOMY    . CARDIAC CATHETERIZATION    . COLONOSCOPY  2014  . COLONOSCOPY WITH PROPOFOL N/A 10/01/2016   Procedure: COLONOSCOPY WITH PROPOFOL;  Surgeon: DLucilla Lame MD;  [facility]  Service: Endoscopy;  Laterality: N/A;  . CORONARY PRESSURE WIRE/FFR WITH 3D MAPPING N/A 09/11/2019   Procedure: Coronary Pressure Wire/FFR w/3D Mapping;  Surgeon: ENelva Bush MD;  [facility];  Service: Cardiovascular;  Laterality: N/A;  . CYSTOCELE REPAIR N/A 12/17/2016   Procedure: ANTERIOR REPAIR (CYSTOCELE);  Surgeon: MBrayton Mars MD;  Location: [facility];  Service: Gynecology;  Laterality: N/A;  . ESOPHAGOGASTRODUODENOSCOPY (EGD) WITH PROPOFOL N/A 08/11/2019   Procedure: ESOPHAGOGASTRODUODENOSCOPY (EGD) WITH PROPOFOL;  Surgeon: WLucilla Lame MD;  Location: [facility];  Service: Endoscopy;  Laterality: N/A;  . POLYPECTOMY  10/01/2016    Procedure: POLYPECTOMY;  Surgeon: DLucilla Lame MD;  [place] [facility]  Service: Endoscopy;;  . RECTAL SURGERY  06/28/2015   Rectal prolapse, laparoscopic rectopexy the coldCharmont, MD; [facility]  . RIGHT/LEFT HEART CATH AND CORONARY ANGIOGRAPHY Bilateral 09/11/2019   Procedure: RIGHT/LEFT HEART CATH AND CORONARY ANGIOGRAPHY;  Surgeon: GMinna Merritts MD;  [facility];  Service: Cardiovascular;  Laterality: Bilateral;  . TUBAL LIGATION    . VAGINAL HYSTERECTOMY Bilateral 12/17/2016   Procedure: HYSTERECTOMY VAGINAL WITH BILATERAL SALPINGO OOPHERECTOMY;  Surgeon: MBrayton Mars MD;  Location: [facility];  Service: Gynecology;  Laterality: Bilateral;    Current Medications: Current Meds  Medication Sig  . aspirin 81 MG tablet Take 81 mg by mouth daily.   .Marland Kitchen  atorvastatin (LIPITOR) 10 MG tablet Take 1 tablet (10 mg total) by mouth at bedtime.  . blood glucose meter kit and supplies Dispense based on patient and insurance preference. Check fasting blood sugar once daily. (FOR ICD E11.69).  . dapagliflozin propanediol (FARXIGA) 10 MG TABS tablet Take 1 tablet (10 mg total) by mouth daily before breakfast.  . gabapentin (NEURONTIN) 300 MG capsule Take 1 capsule (300 mg total) by mouth 3 (three) times daily.  . isosorbide mononitrate (IMDUR) 30 MG 24 hr tablet TAKE 1 TABLET BY MOUTH  DAILY  . Lancets (ONETOUCH DELICA PLUS [id number]) MISC daily.   . metFORMIN (GLUCOPHAGE) 1000 MG tablet Take 1 tablet (1,000 mg total) by mouth 2 (two) times daily with a meal.  . metolazone (ZAROXOLYN) 2.5 MG tablet Take 1 tablet (2.5 mg total) by mouth daily.  . metoprolol succinate (TOPROL-XL) 25 MG 24 hr tablet Take 2 tablets (50 mg total) by mouth daily.  Glory Rosebush ULTRA test strip 1 each daily.  . pantoprazole (PROTONIX) 40 MG tablet Take 1 tablet (40 mg total) by mouth daily.  . polyethylene glycol powder (GLYCOLAX/MIRALAX) 17 GM/SCOOP powder Take 1-2 doses daily as needed for  constipation/to prevent constipation  . potassium chloride (KLOR-CON) 10 MEQ tablet Take 1 tablet (10 mEq total) by mouth as directed. Take with your Torsemide.  Marland Kitchen QUEtiapine (SEROQUEL) 100 MG tablet TAKE 1 TABLET BY MOUTH AT  BEDTIME  . torsemide (DEMADEX) 20 MG tablet Take 1 tablet (20 mg total) by mouth every other day. Take Potassium with Torsemide.    Allergies:   Lisinopril   Social History   Socioeconomic History  . Marital status: Married    Spouse name: Paediatric nurse  . Number of children: 1  . Years of education: Not on file  . Highest education level: 9th grade  Occupational History    Employer: DISABLED    Comment: for panic attacks  Tobacco Use  . Smoking status: Former Smoker    Packs/day: 1.50    Years: 50.00    Pack years: 75.00    Types: Cigarettes    Quit date: 11/29/2018    Years since quitting: 2.2  . Smokeless tobacco: Never Used  . Tobacco comment: started at age 67  Vaping Use  . Vaping Use: Never used  Substance and Sexual Activity  . Alcohol use: No    Alcohol/week: 0.0 standard drinks  . Drug use: No  . Sexual activity: Not Currently  Other Topics Concern  . Not on file  Social History Narrative   Pt had one son, who died when was 79 YO in a MVA.   Social Determinants of Health   Financial Resource Strain: Low Risk   . Difficulty of Paying Living Expenses: Not hard at all  Food Insecurity: No Food Insecurity  . Worried About Charity fundraiser in the Last Year: Never true  . Ran Out of Food in the Last Year: Never true  Transportation Needs: No Transportation Needs  . Lack of Transportation (Medical): No  . Lack of Transportation (Non-Medical): No  Physical Activity: Inactive  . Days of Exercise per Week: 0 days  . Minutes of Exercise per Session: 0 min  Stress: No Stress Concern Present  . Feeling of Stress : Not at all  Social Connections: Not on file     Family History:  The patient's family history includes Cancer in her sister;  Diabetes in her brother, brother, mother, sister, and sister; Healthy in her sister;  Heart disease in her brother and father; Hypertension in her father; Lymphoma in her brother and brother. There is no history of Breast cancer, Ovarian cancer, or Colon cancer.  ROS:   Review of Systems  Constitutional: Positive for malaise/fatigue. Negative for chills, diaphoresis, fever and weight loss.  HENT: Negative for congestion.   Eyes: Negative for discharge and redness.  Respiratory: Positive for shortness of breath. Negative for cough, sputum production and wheezing.   Cardiovascular: Positive for orthopnea. Negative for chest pain, palpitations, claudication, leg swelling and PND.       Stable two-pillow orthopnea  Gastrointestinal: Negative for abdominal pain, heartburn, nausea and vomiting.       Abdominal distention  Musculoskeletal: Negative for falls and myalgias.  Skin: Negative for rash.  Neurological: Positive for weakness. Negative for dizziness, tingling, tremors, sensory change, speech change, focal weakness and loss of consciousness.  Endo/Heme/Allergies: Does not bruise/bleed easily.  Psychiatric/Behavioral: Negative for substance abuse. The patient is not nervous/anxious.   All other systems reviewed and are negative.    EKGs/Labs/Other Studies Reviewed:    Studies reviewed were summarized above. The additional studies were reviewed today:  Coronary CTA 06/30/2020: Aorta: Normal size. Mild aortic atherosclerosis. No dissection.  Aortic Valve: Trileaflet. No calcifications.  Coronary Arteries: Normal coronary origin. Right dominance.  RCA is a large dominant artery that gives rise to PDA and PLVB. There is mild (25-49%) soft plaque stenosis in the proximal to mid vessel followed by mild (25-49%) mixed plaque stenosis.  Left main is a large artery that gives rise to LAD and LCX arteries.  LAD is a large vessel that gives rise to medium size D1 and D2 and small  D3; there is long, soft plaque stenosis in the mid vessel mild (25-49%) followed by moderate (50-69%).  LCX is a non-dominant artery that gives rise to one large OM1 branch. There is no plaque.  Other findings:  Normal pulmonary vein drainage into the left atrium.  Normal let atrial appendage without a thrombus.  Normal size of the pulmonary artery.  IMPRESSION: 1. Coronary calcium score of 39.3. This was 15 percentile for age and sex matched control.  2. Normal coronary origin with right dominance.  3. Moderate (50-69%) soft plaque stenosis in the mid LAD; mild (25-49%) soft and mixed plaque stenoses in the proximal to mid RCA.  4. Study will be sent for FFR. __________  FFR 07/01/2020: 1. Left Main: findings 0.99, 0.99  2. LAD: findings 0.99, 0.97 0.92  3. OM: Findings 0.98, 0.88 0.84  4. RCA: findings 0.97, 0.81 0.84  IMPRESSION: FFR not suggestive of significant obstructive disease. __________  Coronary CTA 06/09/2020: Unable to be performed secondary to heart rate -Calcium score 2.67 which places the patient in the 49th percentile for age and sex matched control __________   2D echo 05/30/2020: 1. Left ventricular ejection fraction, by estimation, is 30 to 35%. The  left ventricle has moderately decreased function. Left ventricular  endocardial border not optimally defined to evaluate regional wall motion.  Left ventricular diastolic parameters  are consistent with Grade II diastolic dysfunction (pseudonormalization).  2. Right ventricular systolic function was not well visualized. The right  ventricular size is normal. Tricuspid regurgitation signal is inadequate  for assessing PA pressure.  3. The mitral valve is normal in structure. Mild mitral valve  regurgitation. No evidence of mitral stenosis.  4. The aortic valve was not well visualized. Aortic valve regurgitation  is not visualized. No aortic stenosis is present.  5. The  inferior vena cava is normal in size with greater than 50%  respiratory variability, suggesting right atrial pressure of 3 mmHg.  6. Challenging image quality. __________  2D echo 08/2019: 1. Left ventricular ejection fraction, by visual estimation, is 25 to  30%. The left ventricle has moderate to severely decreased function. There  is no left ventricular hypertrophy.  2. Left ventricular diastolic parameters are consistent with Grade I  diastolic dysfunction (impaired relaxation).  3. The left ventricle demonstrates global hypokinesis.  4. Global right ventricle has normal systolic function.The right  ventricular size is normal. No increase in right ventricular wall  thickness.  5. Left atrial size was normal.  6. TR signal is inadequate for assessing pulmonary artery systolic  Pressure. __________  [facility] 08/2019: Coronary angiography:  Coronary dominance: Right  Left mainstem: Large vessel that bifurcates into the LAD and left circumflex, no significant disease noted  Left anterior descending (LAD): Large vessel that extends to the apical region, diagonal branch 2 of moderate size, mild proximal LAD disease estimated at 30%  Left circumflex (LCx): Large vessel with OM branch 2, no significant disease noted  Right coronary artery (RCA): Right dominant vessel with PL and PDA, 70% proximal RCA disease followed by sequential 50% lesion proximal to mid vessel  Left ventriculography: Left ventricular systolic function is normal, LVEF is estimated at 50 to 55%, there is no significant mitral regurgitation , no significant aortic valve stenosis  Right heart pressures RA 10 RV 29/8, 11 PA 27/16 mean 20 Wedge 12 Cardiac output 5.04 Cardiac index 2.51  Final Conclusions:  Single-vessel disease RCA Discussed with interventional cardiology, they will perform FFR to determine if intervention needed Right heart pressures are normal   Recommendations:  We will  defer coronary intervention to interventional cardiology Needs weight loss, management of COPD Management of hyperlipidemia __________  iFR 08/2019:  Conclusions: 1. Single vessel coronary with sequential 70% and 50% proximal and mid RCA stenoses that are not hemodynamically significant (iFR = 0.97). See Dr. Donivan Scull diagnostic catheterization report for further details of right/left heart catheterization.  Recommendations: 1. Medical therapy and risk factor modification to prevent progression of disease.    EKG:  EKG is not ordered today.    Recent Labs: 10/17/2020: TSH 2.440 01/23/2021: ALT 116 01/24/2021: BNP 67.0 01/27/2021: Magnesium 2.0 03/08/2021: BUN 18; Creatinine, Ser 0.82; Potassium 4.6; Sodium 141  Recent Lipid Panel    Component Value Date/Time   CHOL 79 (L) 01/23/2021 1511   TRIG 215 (H) 01/23/2021 1511   HDL 31 (L) 01/23/2021 1511   CHOLHDL 1.5 11/30/2019 1135   CHOLHDL 1.7 01/01/2018 0000   VLDL 29 08/06/2016 0824   LDLCALC 15 01/23/2021 1511   LDLCALC 29 01/01/2018 0000    PHYSICAL EXAM:    VS:  BP 114/76   Pulse (!) 52   Ht '5\' 3"'  (1.6 m)   Wt 239 lb (108.4 kg)   BMI 42.34 kg/m   BMI: Body mass index is 42.34 kg/m.  Physical Exam Vitals reviewed.  Constitutional:      Appearance: She is well-developed.  HENT:     Head: Normocephalic and atraumatic.  Eyes:     General:        Right eye: No discharge.        Left eye: No discharge.  Neck:     Comments: JVD difficult to assess secondary to body habitus Cardiovascular:     Rate and Rhythm: Normal rate and regular rhythm.  Pulses: No midsystolic click and no opening snap.          Posterior tibial pulses are 2+ on the right side and 2+ on the left side.     Heart sounds: Normal heart sounds, S1 normal and S2 normal. Heart sounds not distant. No murmur heard. No friction rub.  Pulmonary:     Effort: Pulmonary effort is normal. No respiratory distress.     Breath sounds: Normal breath  sounds. No decreased breath sounds, wheezing or rales.  Chest:     Chest wall: No tenderness.  Abdominal:     General: There is distension.     Palpations: Abdomen is soft.     Tenderness: There is no abdominal tenderness.  Musculoskeletal:     Cervical back: Normal range of motion.  Skin:    General: Skin is warm and dry.     Nails: There is no clubbing.  Neurological:     Mental Status: She is alert and oriented to person, place, and time.  Psychiatric:        Speech: Speech normal.        Behavior: Behavior normal.        Thought Content: Thought content normal.        Judgment: Judgment normal.     Wt Readings from Last 3 Encounters:  03/13/21 239 lb (108.4 kg)  03/08/21 242 lb (109.8 kg)  02/10/21 242 lb 8 oz (110 kg)     ASSESSMENT & PLAN:   1. HFrEF secondary to NICM with underlying LBBB: She has NYHA class III symptoms.  Volume status is difficult to assess secondary to body habitus.  Symptoms of underlying chronic dyspnea remain largely unchanged.  She did not note vigorous urine output or significant change in symptoms following brief trial of metolazone with torsemide.  Follow-up labs were stable as outlined above.  Historically, we have had difficulty in titrating her diuretic secondary to AKI.  She has been intolerant to Entresto secondary to cough.  Losartan has previously been held secondary to relative hypotension.  She remains on Farxiga, Toprol-XL, spironolactone, and torsemide.  Relative hypotension precludes escalation of GDMT at this time.  We will plan for RHC to better assess her volume status to help gauge ongoing diuresis given persistent dyspnea.  We will not pursue LHC at this time given stable longstanding symptoms, national contrast shortage and recent coronary CTA less than 1 year ago showing nonobstructive disease.  Further recommendations regarding diuresis pending RHC.  Continue to optimize medical therapy as vital signs and labs allow with plans to  pursue a repeat limited echo down the road.  With regards to her cardiomyopathy and underlying left bundle branch block she has been evaluated by EP with current plans to continue optimization of medical therapy and diuresis for now.  CHF education.  2. Nonobstructive CAD: No symptoms of chest pain.  Prior noted moderate RCA disease with normal iFR in 08/2019 with subsequent coronary CTA in 06/2020 showing nonobstructive disease.  Continue risk factor modification and medical therapy including aspirin, atorvastatin, Toprol-XL, and Imdur.  No indication for further ischemic testing at this time.  3. PVCs: Quiescent.  Continue Toprol-XL as above.  4. HLD: LDL of 15 in 01/2021.  She remains on atorvastatin with history of fatty liver disease.  5. COPD/restrictive lung disease: Stable.  Followed by pulmonology.  6. Sleep apnea: She does have a history of underlying sleep apnea with prior CPAP usage however she no longer has her CPAP  machine given loss of insurance.  Recommend she discuss this further with pulmonology.  Suspect underlying obesity, OSA, and possible OHS are also contributing to her overall presentation.  7. Morbid obesity with deconditioning: Weight is down 3 pounds when compared to last visit.  Continued weight loss is advised.  Underlying obesity with physical deconditioning continue to contribute to her overall presentation.  8. Diabetes: Poorly controlled.  Follow-up with PCP as directed.  Disposition: F/u with Dr. Rockey Situ or an APP in 1 to 2-week, and EP as directed.   Medication Adjustments/Labs and Tests Ordered: Current medicines are reviewed at length with the patient today.  Concerns regarding medicines are outlined above. Medication changes, Labs and Tests ordered today are summarized above and listed in the Patient Instructions accessible in Encounters.   Signed, Christell Faith, PA-C 03/13/2021 8:53 AM     [facility] [address]  [place], [place] [address] [phone]

## 2021-03-10 NOTE — Chronic Care Management (AMB) (Signed)
Chronic Care Management Pharmacy Assistant   Name: Kim Buchanan  MRN: [id number] DOB: 1950/12/31  Kim Buchanan is an 70 y.o. year old female who presents for her initial CCM visit with the clinical pharmacist.  Reason for Encounter: Initial CCM Visit    Recent office visits:  03/08/21- Kim Buchanan(PCP)-General Follow Up.Restart Farxiga 10 mg daily. Continue current medication regimen.Return in 3 months.  01/23/21- Kim Dingwall, NP(PCP)-Seen for General Follow up.Restart [name]. Increase Lasix with extra 20 MG daily x 3 days.  Recommend she monitor BP at home at least 3 days a week and document for providers.  Focus on DASH diet. Return in 3 months.  10/17/20- Kim Billings, NP(PCP)- General Follow up. Recommend she monitor BP at home at least 3 days a week and document. Focus on DASH diet. Increase Farxiga to 10 mg daily.Incease Metformin to 1000 mg BID. Recommend she monitor BS daily and document   Recommend she take Melatonin, may take up to 10 MG, at night along with her Seroquel. Return in 3 months.  Recent consult visits:  02/10/21- Kim Faith, PA(Cardiology)-Seen for 2 week follow up. If renal function is improved to stable we will plan to add metolazone 2.5 mg daily for 2 days along with added KCl and have her take torsemide 20 mg daily for the next 2 days while on metolazone. Then start on torsemide 20 mg and KCL every other day. Follow up lab on 02/13/21. Continue all other medications. Follow up in 1 month.  01/27/21-Kim [name], PA(Cardiology)-Seen for General Follow up.Continue to hold losartan. Continue all other medications. Follow up in 2 weeks.   04/05/22Marrianne Mood, PA(Cardiology)-Seen for Follow up and SOB. Hold losartan 25 mg, Start Torsemide 20 mg daily. Discontinue lasix 40 mg. Hold potassium for now due to elevated high K. Recheck labs. Recommended she closely follow her symptoms with close follow-up recommended later this week  01/04/21-  Kim Mage, MD(Cardiology)- Follow up visit in chronic combined CHF. Increase lasix to 40 mg daily. Add potassium. Increase Losartan to 25 mg daily. Labs today. Return in 3 months.  11/23/20- Kim Buchanan(Oncology) Video visit-Counseling visit to discuss lung cancer screening.  10/28/20- Kim Faith, PA(Cardiology)-  Follow up visit in chronic combined CHF. Recommended titrate furosemide to 40 mg daily for the next 3 days followed by resumption of 20 mg daily.Restart Losartan 12.5 mg daily. Follow up in 3 months.   10/05/20- Kim Mage, MD(Cardiology)-Initial visit for nonischemic cardiomyopathy. Stop losartan and start Entresto.BID. Referral to Dr. Aundra Buchanan at the heart failure clinic.Marland Kitchen Return in 2 weeks.   Hospital visits:  None in previous 6 months  Medications: Outpatient Encounter Medications as of 03/10/2021  Medication Sig  . aspirin 81 MG tablet Take 81 mg by mouth daily.   Marland Kitchen atorvastatin (LIPITOR) 10 MG tablet Take 1 tablet (10 mg total) by mouth at bedtime.  . blood glucose meter kit and supplies Dispense based on patient and insurance preference. Check fasting blood sugar once daily. (FOR ICD E11.69).  . dapagliflozin propanediol (FARXIGA) 10 MG TABS tablet Take 1 tablet (10 mg total) by mouth daily before breakfast.  . gabapentin (NEURONTIN) 300 MG capsule Take 1 capsule (300 mg total) by mouth 3 (three) times daily.  . isosorbide mononitrate (IMDUR) 30 MG 24 hr tablet TAKE 1 TABLET BY MOUTH  DAILY  . Lancets (ONETOUCH DELICA PLUS [id number]) MISC daily.   . metFORMIN (GLUCOPHAGE) 1000 MG tablet Take 1 tablet (1,000 mg total) by mouth 2 (two)  times daily with a meal.  . metolazone (ZAROXOLYN) 2.5 MG tablet Take 1 tablet (2.5 mg total) by mouth daily.  . metoprolol succinate (TOPROL-XL) 25 MG 24 hr tablet Take 2 tablets (50 mg total) by mouth daily.  Glory Rosebush ULTRA test strip 1 each daily.  . pantoprazole (PROTONIX) 40 MG tablet Take 1 tablet (40 mg total) by mouth daily.   . polyethylene glycol powder (GLYCOLAX/MIRALAX) 17 GM/SCOOP powder Take 1-2 doses daily as needed for constipation/to prevent constipation  . potassium chloride (KLOR-CON) 10 MEQ tablet Take 1 tablet (10 mEq total) by mouth as directed. Take with your Torsemide.  Marland Kitchen QUEtiapine (SEROQUEL) 100 MG tablet TAKE 1 TABLET BY MOUTH AT  BEDTIME  . spironolactone (ALDACTONE) 25 MG tablet Take 0.5 tablets (12.5 mg total) by mouth daily.  Marland Kitchen torsemide (DEMADEX) 20 MG tablet Take 1 tablet (20 mg total) by mouth every other day. Take Potassium with Torsemide. (Patient not taking: Reported on 03/08/2021)   No facility-administered encounter medications on file as of 03/10/2021.   Current Medications: aspirin 81 MG tablet atorvastatin (LIPITOR) 10 MG tablet last filled 12/22/20 90 DS blood glucose meter kit and supplies dapagliflozin propanediol (FARXIGA) 10 MG TABS tablet last filled 01/23/21 30 DS gabapentin (NEURONTIN) 300 MG capsule last filled 01/26/21 30 DS isosorbide mononitrate (IMDUR) 30 MG 24 hr tablet last filled 02/25/21 90 DS Lancets (ONETOUCH DELICA PLUS [id number]) MISC metFORMIN (GLUCOPHAGE) 1000 MG tablet last filled 01/11/21 90 DS metolazone (ZAROXOLYN) 2.5 MG tablet last filled 02/10/21 2 DS metoprolol succinate (TOPROL-XL) 25 MG 24 hr tablet last filled 014/07/22 90 DS ONETOUCH ULTRA test strip pantoprazole (PROTONIX) 40 MG tablet last filled 12/09/20 90 DS polyethylene glycol powder (GLYCOLAX/MIRALAX) 17 GM/SCOOP powder potassium chloride (KLOR-CON) 10 MEQ tablet last filled 01/04/21 90 DS QUEtiapine (SEROQUEL) 100 MG tablet last filled 01/05/21 90 DS spironolactone (ALDACTONE) 25 MG tablet last filled 02/10/21 90 DS torsemide (DEMADEX) 20 MG tablet last filled 01/24/21 30 DS   Have you seen any other providers since your last visit?   Any changes in your medications or health?   Any side effects from any medications?   Do you have an symptoms or problems not managed by your  medications?   Any concerns about your health right now?   Has your provider asked that you check blood pressure, blood sugar, or follow special diet at home?   Do you get any type of exercise on a regular basis?   Can you think of a goal you would like to reach for your health?   Do you have any problems getting your medications?   Is there anything that you would like to discuss during the appointment?    Please bring medications and supplements to appointment  Attempted to reach patient to complete initial questions for CPP visit. Left voicemail x 2. Unable to reach patient. Patient completed CPP visit on 03/13/21.   Star Rating Drugs: atorvastatin (LIPITOR) 10 MG tablet last filled 12/22/20 90 DS metFORMIN (GLUCOPHAGE) 1000 MG tablet last filled 01/11/21 90 DS dapagliflozin propanediol (FARXIGA) 10 MG TABS tablet last filled 01/23/21 30 DS  [facility] Clinical Pharmacist Assistant [phone]

## 2021-03-13 ENCOUNTER — Ambulatory Visit (INDEPENDENT_AMBULATORY_CARE_PROVIDER_SITE_OTHER): Payer: Medicare Other | Admitting: Physician Assistant

## 2021-03-13 ENCOUNTER — Other Ambulatory Visit: Payer: Self-pay

## 2021-03-13 ENCOUNTER — Telehealth: Payer: Self-pay | Admitting: Nurse Practitioner

## 2021-03-13 ENCOUNTER — Ambulatory Visit: Payer: Medicare Other | Admitting: Pharmacist

## 2021-03-13 ENCOUNTER — Encounter: Payer: Self-pay | Admitting: Physician Assistant

## 2021-03-13 VITALS — BP 114/76 | HR 52 | Ht 63.0 in | Wt 239.0 lb

## 2021-03-13 DIAGNOSIS — J984 Other disorders of lung: Secondary | ICD-10-CM | POA: Diagnosis not present

## 2021-03-13 DIAGNOSIS — I5020 Unspecified systolic (congestive) heart failure: Secondary | ICD-10-CM | POA: Diagnosis not present

## 2021-03-13 DIAGNOSIS — E1142 Type 2 diabetes mellitus with diabetic polyneuropathy: Secondary | ICD-10-CM

## 2021-03-13 DIAGNOSIS — I2510 Atherosclerotic heart disease of native coronary artery without angina pectoris: Secondary | ICD-10-CM

## 2021-03-13 DIAGNOSIS — I493 Ventricular premature depolarization: Secondary | ICD-10-CM

## 2021-03-13 DIAGNOSIS — I428 Other cardiomyopathies: Secondary | ICD-10-CM

## 2021-03-13 DIAGNOSIS — E785 Hyperlipidemia, unspecified: Secondary | ICD-10-CM | POA: Diagnosis not present

## 2021-03-13 DIAGNOSIS — I447 Left bundle-branch block, unspecified: Secondary | ICD-10-CM | POA: Diagnosis not present

## 2021-03-13 DIAGNOSIS — G4733 Obstructive sleep apnea (adult) (pediatric): Secondary | ICD-10-CM

## 2021-03-13 MED ORDER — TRULICITY 0.75 MG/0.5ML ~~LOC~~ SOAJ: 0.7500 mg | SUBCUTANEOUS | 1 refills | Status: DC

## 2021-03-13 NOTE — Telephone Encounter (Signed)
Trulicity sent to the pharmacy for patient.

## 2021-03-13 NOTE — Progress Notes (Unsigned)
Chronic Care Management Pharmacy Note  03/13/2021 Name:  Kim Buchanan MRN:  [id number] DOB:  1951/10/08  Subjective: Kim Buchanan is an 70 y.o. year old female who is a primary patient of [name], NP.  The CCM team was consulted for assistance with disease management and care coordination needs.    Engaged with patient face to face for initial visit in response to provider referral for pharmacy case management and/or care coordination services.   Consent to Services:  The patient was given the following information about Chronic Care Management services today, agreed to services, and gave verbal consent: 1. CCM service includes personalized support from designated clinical staff supervised by the primary care provider, including individualized plan of care and coordination with other care providers 2. 24/7 contact phone numbers for assistance for urgent and routine care needs. 3. Service will only be billed when office clinical staff spend 20 minutes or more in a month to coordinate care. 4. Only one practitioner may furnish and bill the service in a calendar month. 5.The patient may stop CCM services at any time (effective at the end of the month) by phone call to the office staff. 6. The patient will be responsible for cost sharing (co-pay) of up to 20% of the service fee (after annual deductible is met). Patient agreed to services and consent obtained.  Patient Care Team: [name], NP as PCP - General (Nurse Practitioner) [name], MD as PCP - Cardiology (Cardiology) [name], MD as PCP - Electrophysiology (Cardiology) [name], OD as Consulting Physician (Optometry) [name], MD as Consulting Physician (Pulmonary Disease)  Recent office visits: ***  Recent consult visits: 03/13/21-[name], PA (cards)-; upcoming5/31/22- Right heart cath  Hospital visits: {Hospital DC Yes/No:25215}  Objective:  Lab Results  Component  Value Date   CREATININE 0.82 03/08/2021   BUN 18 03/08/2021   GFRNONAA >60 02/13/2021   GFRAA 79 10/17/2020   NA 141 03/08/2021   K 4.6 03/08/2021   CALCIUM 9.4 03/08/2021   CO2 21 03/08/2021   GLUCOSE 197 (H) 03/08/2021    Lab Results  Component Value Date/Time   HGBA1C 8.6 (H) 01/23/2021 03:08 PM   HGBA1C 7.7 (H) 10/17/2020 10:21 AM   MICROALBUR 30 (H) 06/15/2020 03:45 PM    Last diabetic Eye exam:  Lab Results  Component Value Date/Time   HMDIABEYEEXA No Retinopathy 02/07/2021 09:12 AM    Last diabetic Foot exam: No results found for: HMDIABFOOTEX   Lab Results  Component Value Date   CHOL 79 (L) 01/23/2021   HDL 31 (L) 01/23/2021   LDLCALC 15 01/23/2021   TRIG 215 (H) 01/23/2021   CHOLHDL 1.5 11/30/2019    Hepatic Function Latest Ref Rng & Units 01/23/2021 11/30/2019 08/14/2019  Total Protein 6.0 - 8.5 g/dL 6.9 6.7 7.1  Albumin 3.8 - 4.8 g/dL 4.0 4.0 3.6  AST 0 - 40 IU/L 76(H) 27 34  ALT 0 - 32 IU/L 116(H) 48(H) 42  Alk Phosphatase 44 - 121 IU/L 105 96 72  Total Bilirubin 0.0 - 1.2 mg/dL 0.5 0.4 0.7    Lab Results  Component Value Date/Time   TSH 2.440 10/17/2020 10:27 AM   TSH 2.627 08/14/2019 09:48 AM   TSH 0.749 02/24/2013 11:07 AM   TSH 2.57 01/28/2013 01:09 PM   FREET4 0.73 08/14/2019 09:48 AM    CBC Latest Ref Rng & Units 11/30/2019 09/08/2019 08/14/2019  WBC 3.4 - 10.8 x10E3/uL 7.3 7.6 7.4  Hemoglobin  11.1 - 15.9 g/dL 13.2 11.7(L) 11.8(L)  Hematocrit 34.0 - 46.6 % 40.2 36.9 37.3  Platelets 150 - 450 x10E3/uL 306 258 242    No results found for: VD25OH  Clinical ASCVD: {YES/NO:21197} The ASCVD Risk score [name], et al., [name]    Depression screen [facility] 2/9 01/23/2021 12/02/2020 10/17/2020  Decreased Interest 0 0 0  Down, Depressed, Hopeless 0 0 0  PHQ - 2 Score 0 0 0  Altered sleeping 0 0 0  Tired, decreased energy '3 3 3  ' Change in appetite  '1 3 3  ' Feeling bad or failure about yourself  0 0 0  Trouble concentrating 0 0 1  Moving slowly or fidgety/restless 0 0 0  Suicidal thoughts 0 0 0  PHQ-9 Score '4 6 7  ' Difficult doing work/chores Not difficult at all Not difficult at all Not difficult at all  Some recent data might be hidden     ***Other: (CHADS2VASc if Afib, MMRC or CAT for COPD, ACT, DEXA)  Social History   Tobacco Use  Smoking Status Former Smoker  . Packs/day: 1.50  . Years: 50.00  . Pack years: 75.00  . Types: Cigarettes  . Quit date: 11/29/2018  . Years since quitting: 2.2  Smokeless Tobacco Never Used  Tobacco Comment   started at age 50   BP Readings from Last 3 Encounters:  03/13/21 114/76  03/08/21 121/70  02/10/21 118/62   Pulse Readings from Last 3 Encounters:  03/13/21 (!) 52  03/08/21 95  02/10/21 90   Wt Readings from Last 3 Encounters:  03/13/21 239 lb (108.4 kg)  03/08/21 242 lb (109.8 kg)  02/10/21 242 lb 8 oz (110 kg)   BMI Readings from Last 3 Encounters:  03/13/21 42.34 kg/m  03/08/21 42.87 kg/m  02/10/21 42.96 kg/m    Assessment/Interventions: Review of patient past medical history, allergies, medications, health status, including review of consultants reports, laboratory and other test data, was performed as part of comprehensive evaluation and provision of chronic care management services.   SDOH:  (Social Determinants of Health) assessments and interventions performed: {yes/no:20286}  SDOH Screenings   Alcohol Screen: Not on file  Depression (PHQ2-9): Low Risk   . PHQ-2 Score: 4  Financial Resource Strain: Low Risk   . Difficulty of Paying Living Expenses: Not hard at all  Food Insecurity: No Food Insecurity  . Worried About Charity fundraiser in the Last Year: Never true  . Ran Out of Food in the Last Year: Never true  Housing: Not on file  Physical Activity: Inactive  . Days of Exercise per Week: 0 days  . Minutes of Exercise per Session: 0 min  Social  Connections: Not on file  Stress: No Stress Concern Present  . Feeling of Stress : Not at all  Tobacco Use: Medium Risk  . Smoking Tobacco Use: Former Smoker  . Smokeless Tobacco Use: Never Used  Transportation Needs: No Transportation Needs  . Lack of Transportation (Medical): No  . Lack of Transportation (Non-Medical): No    CCM Care Plan  Allergies  Allergen Reactions  . Lisinopril Cough    Medications Reviewed Today    Reviewed by Gentry Roch, CPhT (Pharmacy Technician) on 03/13/21 at [place] List Status: F/U phone call - Tues  Medication Order Taking? Sig Documenting Provider Last Dose Status Informant  aspirin 81 MG tablet  [id number]  Take 81 mg by mouth daily.  [provider]  Active Self           Med Note Josiah Lobo, [facility]   Ludwig Clarks Jul 19, 2017 12:33 PM)    atorvastatin (LIPITOR) 10 MG tablet [id number]  Take 1 tablet (10 mg total) by mouth at bedtime. Rise Mu, PA-C  Active   blood glucose meter kit and supplies [id number]  Dispense based on patient and insurance preference. Check fasting blood sugar once daily. (FOR ICD E11.69). Virginia Crews, MD  Active   dapagliflozin propanediol (FARXIGA) 10 MG TABS tablet [id number]  Take 1 tablet (10 mg total) by mouth daily before breakfast. Marnee Guarneri T, NP  Active   gabapentin (NEURONTIN) 300 MG capsule [id number]  Take 1 capsule (300 mg total) by mouth 3 (three) times daily. [name], NP  Active   isosorbide mononitrate (IMDUR) 30 MG 24 hr tablet [id number]  TAKE 1 TABLET BY MOUTH  DAILY Venita Lick, NP  Active   Lancets (ONETOUCH DELICA PLUS [id number]) MISC [id number]  daily.  [provider]  Active   metFORMIN (GLUCOPHAGE) 1000 MG tablet [id number]  Take 1 tablet (1,000 mg total) by mouth 2 (two) times daily with a meal. Cannady, Jolene T, NP  Active   metolazone (ZAROXOLYN) 2.5 MG tablet [id number]  Take 1 tablet (2.5 mg total) by mouth daily. Rise Mu, PA-C  Active    metoprolol succinate (TOPROL-XL) 25 MG 24 hr tablet [id number]  Take 2 tablets (50 mg total) by mouth daily. Rise Mu, PA-C  Active   [facility] ULTRA test strip [id number]  1 each daily. [provider]  Active   pantoprazole (PROTONIX) 40 MG tablet [id number]  Take 1 tablet (40 mg total) by mouth daily. Marnee Guarneri T, NP  Active   polyethylene glycol powder (GLYCOLAX/MIRALAX) 17 GM/SCOOP powder [id number]  Take 1-2 doses daily as needed for constipation/to prevent constipation [provider]  Active   potassium chloride (KLOR-CON) 10 MEQ tablet [id number]  Take 1 tablet (10 mEq total) by mouth as directed. Take with your Torsemide. Rise Mu, PA-C  Active   QUEtiapine (SEROQUEL) 100 MG tablet [id number]  TAKE 1 TABLET BY MOUTH AT  BEDTIME Cannady, Jolene T, NP  Active   spironolactone (ALDACTONE) 25 MG tablet [id number]  Take 0.5 tablets (12.5 mg total) by mouth daily. Marnee Guarneri T, NP  Expired 03/09/21 2359   torsemide (DEMADEX) 20 MG tablet [id number]  Take 1 tablet (20 mg total) by mouth every other day. Take Potassium with Torsemide. Rise Mu, PA-C  Active           Patient Active Problem List   Diagnosis Date Noted  . OSA (obstructive sleep apnea) 09/18/2020  . Aortic atherosclerosis ([place]) 06/10/2020  . Hepatic steatosis 06/10/2020  . HFrEF (heart failure with reduced ejection fraction) ([place]) 06/10/2020  . Osteoarthritis of left hip 03/16/2020  . Type 2 diabetes mellitus with peripheral neuropathy ([place]) 12/11/2019  . NICM (nonischemic cardiomyopathy) ([place]) 09/11/2019  . CAD (coronary artery disease), native coronary artery 09/11/2019  . Hypertension associated with diabetes ([place]) 06/05/2019  . Morbid obesity ([place]) 10/03/2018  . Anemia 10/03/2018  . COPD (chronic obstructive pulmonary disease) ([place]) 03/13/2018  . Insomnia 11/06/2017  . Hyperlipidemia associated with type 2 diabetes mellitus ([place]) 07/19/2017  . S/P vaginal hysterectomy 12/17/2016  .  Vaginal atrophy 10/31/2016  . Hx of colonic polyps   . DDD (degenerative disc disease), lumbar 02/21/2016  .  Low back pain 02/14/2016  . GAD (generalized anxiety disorder) 06/06/2015  . MDD (major depressive disorder) 06/06/2015  . Gastroesophageal reflux disease 06/06/2015    Immunization History  Administered Date(s) Administered  . Influenza, High Dose Seasonal PF 06/16/2019, 07/04/2020  . Influenza, Seasonal, Injecte, Preservative Fre 08/18/2008, 07/14/2014  . Influenza,inj,Quad PF,6+ Mos 09/25/2013, 08/05/2015, 08/02/2016, 07/24/2017  . Moderna Sars-Covid-2 Vaccination 12/02/2019, 12/30/2019, 10/29/2020  . Pneumococcal Conjugate-13 03/13/2018  . Pneumococcal Polysaccharide-23 08/06/2019  . Tdap 08/08/2009, 05/13/2014    Conditions to be addressed/monitored:  {USCCMDZASSESSMENTOPTIONS:23563}  There are no care plans that you recently modified to display for this patient.    Medication Assistance: {MEDASSISTANCEINFO:25044}  Patient's preferred pharmacy is:  [facility], [address] [place] [address] Phone: [phone] Fax: [facility], [address], [address], [address] [place] [address] Phone: [phone] Fax: [phone]  Uses pill box? {Yes or If no, why not?:20788} Pt endorses ***% compliance  We discussed: {Pharmacy options:24294} Patient decided to: {US Pharmacy Plan:23885}  Care Plan and Follow Up Patient Decision:  {FOLLOWUP:24991}  Plan: {CM FOLLOW UP PLAN:25073}  ***

## 2021-03-13 NOTE — Patient Instructions (Signed)
Medication Instructions:  No changes at this time.  *If you need a refill on your cardiac medications before your next appointment, please call your pharmacy*   Lab Work: CBC today  If you have labs (blood work) drawn today and your tests are completely normal, you will receive your results only by: Marland Kitchen MyChart Message (if you have MyChart) OR . A paper copy in the mail If you have any lab test that is abnormal or we need to change your treatment, we will call you to review the results.   Testing/Procedures: [facility] Cardiac Cath Instructions   You are scheduled for a Cardiac Cath on: May 31 st with Dr. Saunders Revel  Please arrive at 09:30 am on the day of your procedure  Please expect a call from our [facility] to pre-register you  Do not eat/drink anything after midnight  Someone will need to drive you home  It is recommended someone be with you for the first 24 hours after your procedure  Wear clothes that are easy to get on/off and wear slip on shoes if possible   Medications bring a current list of all medications with you  _XX__ Do not take these medications before your procedure: Torsemide, Spironolactone, Metolazone, Metformin, and Potassium  _XX__ You may take all of your medications the morning of your procedure with enough water to swallow safely  Day of your procedure: Arrive at the [facility] entrance.  Free valet service is available.  After entering the [facility] please check-in at the registration desk (1st desk on your right) to receive your armband. After receiving your armband someone will escort you to the cardiac cath/special procedures waiting area.  The usual length of stay after your procedure is about 2 to 3 hours.  This can vary.  If you have any questions, please call our office at [phone], or you may call the cardiac cath lab at [facility] directly at [phone]    Follow-Up: At  [facility], you and your health needs are our  priority.  As part of our continuing mission to provide you with exceptional heart care, we have created designated Provider Care Teams.  These Care Teams include your primary Cardiologist (physician) and Advanced Practice Providers (APPs -  Physician Assistants and Nurse Practitioners) who all work together to provide you with the care you need, when you need it.    Your next appointment:   1 week(s) after your procedure.   The format for your next appointment:   In Person  Provider:   You may see Ida Rogue, MD or one of the following Advanced Practice Providers on your designated Care Team:    Murray Hodgkins, NP  Christell Faith, PA-C  Marrianne Mood, PA-C  Cadence Gaines, [place]  Laurann Montana, NP

## 2021-03-14 LAB — CBC
Hematocrit: 43.7 % (ref 34.0–46.6)
Hemoglobin: 14.4 g/dL (ref 11.1–15.9)
MCH: 27.9 pg (ref 26.6–33.0)
MCHC: 33 g/dL (ref 31.5–35.7)
MCV: 85 fL (ref 79–97)
Platelets: 327 10*3/uL (ref 150–450)
RBC: 5.17 x10E6/uL (ref 3.77–5.28)
RDW: 15.6 % — ABNORMAL HIGH (ref 11.7–15.4)
WBC: 7.8 10*3/uL (ref 3.4–10.8)

## 2021-03-17 ENCOUNTER — Other Ambulatory Visit: Payer: Self-pay | Admitting: Nurse Practitioner

## 2021-03-17 NOTE — Telephone Encounter (Signed)
Patient got on the phone while I was speaking to her husband and asked for a refill of her protonix along with these glucose supplies. I advised there is a year worth of refills of protonix at the pharmacy, so call [facility] for refills. Advised the glucose supplies will be sent next week due to the Rx is expired and provider will have to send a new prescription to [facility]. She verbalized understanding.

## 2021-03-17 NOTE — Telephone Encounter (Signed)
Patient requesting lancets and strips, and pharmacy has sent in 2 request and no response   [facility], [address], [address] Phone:  [phone]  Fax:  [phone]

## 2021-03-17 NOTE — Telephone Encounter (Signed)
Requested medication (s) are due for refill today: Yes  Requested medication (s) are on the active medication list: Yes  Last refill:  03/11/20  Future visit scheduled: Yes  Notes to clinic:  Unable to refill per protocol, Rx expired.      Requested Prescriptions  Pending Prescriptions Disp Refills   ONETOUCH ULTRA test strip 100 each     Sig: 1 each daily.      Endocrinology: Diabetes - Testing Supplies Passed - 03/17/2021  5:03 PM      Passed - Valid encounter within last 12 months    Recent Outpatient Visits           1 week ago HFrEF (heart failure with reduced ejection fraction) ([place])   [facility] [name], NP   1 month ago HFrEF (heart failure with reduced ejection fraction) ([place])   [place], Jolene T, NP   5 months ago Type 2 diabetes mellitus with peripheral neuropathy ([place])   [place], Jolene T, NP   9 months ago Type 2 diabetes mellitus with peripheral neuropathy ([place])   [place], Henrine Screws T, NP   11 months ago Insomnia, unspecified type   [facility], Lilia Argue, [place]       Future Appointments             In 1 week [name], Areta Haber, PA-C [facility], [place]   In 1 week [name], MD [facility], [facility]   In 2 months [name], NP [facility], [facility]   In 8 months  [employer], [facility]               Lancets (ONETOUCH DELICA PLUS [id number]) MISC      Sig: daily.      Endocrinology: Diabetes - Testing Supplies Passed - 03/17/2021  5:03 PM      Passed - Valid encounter within last 12 months    Recent Outpatient Visits           1 week ago HFrEF (heart failure with reduced ejection fraction) ([place])   [facility] [name], NP   1 month ago HFrEF (heart failure with reduced ejection fraction) ([place])   [place], Jolene T, NP    5 months ago Type 2 diabetes mellitus with peripheral neuropathy ([place])   [place], Jolene T, NP   9 months ago Type 2 diabetes mellitus with peripheral neuropathy ([place])   [place], Henrine Screws T, NP   11 months ago Insomnia, unspecified type   [facility], Lilia Argue, [place]       Future Appointments             In 1 week [name], Areta Haber, PA-C [facility], [place]   In 1 week [name], MD [facility], [facility]   In 2 months [name], NP [facility], [place]   In 8 months  [employer], [place]

## 2021-03-21 ENCOUNTER — Encounter
Admission: RE | Disposition: A | Discharge status: Home / Self Care | Payer: Self-pay | Source: Home / Self Care | Attending: Internal Medicine

## 2021-03-21 ENCOUNTER — Ambulatory Visit
Admission: RE | Admit: 2021-03-21 | Discharge: 2021-03-21 | Disposition: A | Discharge status: Home / Self Care | Payer: Medicare Other | Attending: Internal Medicine | Admitting: Internal Medicine

## 2021-03-21 ENCOUNTER — Encounter: Payer: Self-pay | Admitting: Internal Medicine

## 2021-03-21 ENCOUNTER — Other Ambulatory Visit: Payer: Self-pay

## 2021-03-21 DIAGNOSIS — E1165 Type 2 diabetes mellitus with hyperglycemia: Secondary | ICD-10-CM | POA: Insufficient documentation

## 2021-03-21 DIAGNOSIS — I447 Left bundle-branch block, unspecified: Secondary | ICD-10-CM | POA: Insufficient documentation

## 2021-03-21 DIAGNOSIS — Z888 Allergy status to other drugs, medicaments and biological substances status: Secondary | ICD-10-CM | POA: Diagnosis not present

## 2021-03-21 DIAGNOSIS — Z79899 Other long term (current) drug therapy: Secondary | ICD-10-CM | POA: Insufficient documentation

## 2021-03-21 DIAGNOSIS — I255 Ischemic cardiomyopathy: Secondary | ICD-10-CM

## 2021-03-21 DIAGNOSIS — Z6841 Body Mass Index (BMI) 40.0 and over, adult: Secondary | ICD-10-CM | POA: Insufficient documentation

## 2021-03-21 DIAGNOSIS — E785 Hyperlipidemia, unspecified: Secondary | ICD-10-CM | POA: Insufficient documentation

## 2021-03-21 DIAGNOSIS — I428 Other cardiomyopathies: Secondary | ICD-10-CM | POA: Diagnosis not present

## 2021-03-21 DIAGNOSIS — Z7984 Long term (current) use of oral hypoglycemic drugs: Secondary | ICD-10-CM | POA: Diagnosis not present

## 2021-03-21 DIAGNOSIS — J449 Chronic obstructive pulmonary disease, unspecified: Secondary | ICD-10-CM | POA: Diagnosis not present

## 2021-03-21 DIAGNOSIS — I2510 Atherosclerotic heart disease of native coronary artery without angina pectoris: Secondary | ICD-10-CM | POA: Insufficient documentation

## 2021-03-21 DIAGNOSIS — I5022 Chronic systolic (congestive) heart failure: Secondary | ICD-10-CM | POA: Diagnosis not present

## 2021-03-21 DIAGNOSIS — Z7982 Long term (current) use of aspirin: Secondary | ICD-10-CM | POA: Diagnosis not present

## 2021-03-21 DIAGNOSIS — I5020 Unspecified systolic (congestive) heart failure: Secondary | ICD-10-CM

## 2021-03-21 DIAGNOSIS — Z87891 Personal history of nicotine dependence: Secondary | ICD-10-CM | POA: Insufficient documentation

## 2021-03-21 DIAGNOSIS — G4730 Sleep apnea, unspecified: Secondary | ICD-10-CM | POA: Diagnosis not present

## 2021-03-21 HISTORY — PX: RIGHT HEART CATH: CATH118263

## 2021-03-21 LAB — GLUCOSE, CAPILLARY
Glucose-Capillary: 247 mg/dL — ABNORMAL HIGH (ref 70–99)
Glucose-Capillary: 189 mg/dL — ABNORMAL HIGH (ref 70–99)

## 2021-03-21 SURGERY — RIGHT HEART CATH
Anesthesia: Moderate Sedation

## 2021-03-21 MED ORDER — SODIUM CHLORIDE 0.9 % IV SOLN: 250.0000 mL | INTRAVENOUS | Status: DC | PRN

## 2021-03-21 MED ORDER — MIDAZOLAM HCL 2 MG/2ML IJ SOLN
INTRAMUSCULAR | Status: AC
  Filled 2021-03-21: qty 2

## 2021-03-21 MED ORDER — METOPROLOL SUCCINATE ER 25 MG PO TB24: 25.0000 mg | ORAL_TABLET | Freq: Every day | ORAL | 2 refills | Status: DC

## 2021-03-21 MED ORDER — ONETOUCH DELICA PLUS LANCET30G MISC: 1.0000 [IU] | Freq: Every day | 1 refills | Status: DC

## 2021-03-21 MED ORDER — TORSEMIDE 20 MG PO TABS: 20.0000 mg | ORAL_TABLET | Freq: Every day | ORAL | 3 refills | Status: DC

## 2021-03-21 MED ORDER — SODIUM CHLORIDE 0.9% FLUSH: 3.0000 mL | INTRAVENOUS | Status: DC | PRN

## 2021-03-21 MED ORDER — ACETAMINOPHEN 325 MG PO TABS: 650.0000 mg | ORAL_TABLET | ORAL | Status: DC | PRN

## 2021-03-21 MED ORDER — LABETALOL HCL 5 MG/ML IV SOLN: 10.0000 mg | INTRAVENOUS | Status: DC | PRN

## 2021-03-21 MED ORDER — HEPARIN (PORCINE) IN NACL 1000-0.9 UT/500ML-% IV SOLN
INTRAVENOUS | Status: AC
  Filled 2021-03-21: qty 1000

## 2021-03-21 MED ORDER — FENTANYL CITRATE (PF) 100 MCG/2ML IJ SOLN
INTRAMUSCULAR | Status: AC
  Filled 2021-03-21: qty 2

## 2021-03-21 MED ORDER — SODIUM CHLORIDE 0.9% FLUSH: 3.0000 mL | Freq: Two times a day (BID) | INTRAVENOUS | Status: DC

## 2021-03-21 MED ORDER — LIDOCAINE HCL (PF) 1 % IJ SOLN
INTRAMUSCULAR | Status: AC
  Filled 2021-03-21: qty 30

## 2021-03-21 MED ORDER — HYDRALAZINE HCL 20 MG/ML IJ SOLN: 10.0000 mg | INTRAMUSCULAR | Status: DC | PRN

## 2021-03-21 MED ORDER — SODIUM CHLORIDE 0.9 % IV SOLN: INTRAVENOUS | Status: DC

## 2021-03-21 MED ORDER — ONETOUCH ULTRA VI STRP: 1.0000 | ORAL_STRIP | Freq: Every day | 1 refills | Status: DC

## 2021-03-21 MED ORDER — ONDANSETRON HCL 4 MG/2ML IJ SOLN: 4.0000 mg | Freq: Four times a day (QID) | INTRAMUSCULAR | Status: DC | PRN

## 2021-03-21 MED ORDER — LIDOCAINE HCL (PF) 1 % IJ SOLN
INTRAMUSCULAR | Status: DC | PRN
  Administered 2021-03-21: 2 mL

## 2021-03-21 MED ORDER — HEPARIN (PORCINE) IN NACL 1000-0.9 UT/500ML-% IV SOLN
INTRAVENOUS | Status: DC | PRN
  Administered 2021-03-21: 500 mL

## 2021-03-21 SURGICAL SUPPLY — 7 items
CATH SWAN DBL LUMAN 5F 110 (CATHETERS) ×2 IMPLANT
DRAPE BRACHIAL (DRAPES) ×2 IMPLANT
GUIDEWIRE .025 260CM (WIRE) ×2 IMPLANT
GUIDEWIRE ANGLED .035 180CM (WIRE) ×2 IMPLANT
KIT RIGHT HEART (MISCELLANEOUS) ×2 IMPLANT
PACK CARDIAC CATH (CUSTOM PROCEDURE TRAY) ×2 IMPLANT
SHEATH GLIDE SLENDER 4/5FR (SHEATH) ×2 IMPLANT

## 2021-03-21 NOTE — Interval H&P Note (Signed)
History and Physical Interval Note:  03/21/2021 11:17 AM  [name]  has presented today for surgery, with the diagnosis of chronic HFrEF.  The various methods of treatment have been discussed with the patient and family. After consideration of risks, benefits and other options for treatment, the patient has consented to  Procedure(s): RIGHT HEART CATH (N/A) as a surgical intervention.  The patient's history has been reviewed, patient examined, no change in status, stable for surgery.  I have reviewed the patient's chart and labs.  Questions were answered to the patient's satisfaction.     Kim Buchanan

## 2021-03-21 NOTE — Discharge Instructions (Signed)
Radial Site Care  This sheet gives you information about how to care for yourself after your procedure. Your health care provider may also give you more specific instructions. If you have problems or questions, contact your health care provider. What can I expect after the procedure? After the procedure, it is common to have:  Bruising and tenderness at the catheter insertion area. Follow these instructions at home: Medicines  Take over-the-counter and prescription medicines only as told by your health care provider. Insertion site care  Follow instructions from your health care provider about how to take care of your insertion site. Make sure you: ? Wash your hands with soap and water before you change your bandage (dressing). If soap and water are not available, use hand sanitizer. ? Change your dressing as told by your health care provider. ? Leave stitches (sutures), skin glue, or adhesive strips in place. These skin closures may need to stay in place for 2 weeks or longer. If adhesive strip edges start to loosen and curl up, you may trim the loose edges. Do not remove adhesive strips completely unless your health care provider tells you to do that.  Check your insertion site every day for signs of infection. Check for: ? Redness, swelling, or pain. ? Fluid or blood. ? Pus or a bad smell. ? Warmth.  Do not take baths, swim, or use a hot tub until your health care provider approves.  You may shower 24-48 hours after the procedure, or as directed by your health care provider. ? Remove the dressing and gently wash the site with plain soap and water. ? Pat the area dry with a clean towel. ? Do not rub the site. That could cause bleeding.  Do not apply powder or lotion to the site. Activity  For 24 hours after the procedure, or as directed by your health care provider: ? Do not flex or bend the affected arm. ? Do not push or pull heavy objects with the affected arm. ? Do not drive  yourself home from the hospital or clinic. You may drive 24 hours after the procedure unless your health care provider tells you not to. ? Do not operate machinery or power tools.  Do not lift anything that is heavier than 10 lb (4.5 kg), or the limit that you are told, until your health care provider says that it is safe.  Ask your health care provider when it is okay to: ? Return to work or school. ? Resume usual physical activities or sports. ? Resume sexual activity.   General instructions  If the catheter site starts to bleed, raise your arm and put firm pressure on the site. If the bleeding does not stop, get help right away. This is a medical emergency.  If you went home on the same day as your procedure, a responsible adult should be with you for the first 24 hours after you arrive home.  Keep all follow-up visits as told by your health care provider. This is important. Contact a health care provider if:  You have a fever.  You have redness, swelling, or yellow drainage around your insertion site. Get help right away if:  You have unusual pain at the radial site.  The catheter insertion area swells very fast.  The insertion area is bleeding, and the bleeding does not stop when you hold steady pressure on the area.  Your arm or hand becomes pale, cool, tingly, or numb. These symptoms may represent a serious   problem that is an emergency. Do not wait to see if the symptoms will go away. Get medical help right away. Call your local emergency services (911 in the [place] Do not drive yourself to the hospital. Summary  After the procedure, it is common to have bruising and tenderness at the site.  Follow instructions from your health care provider about how to take care of your radial site wound. Check the wound every day for signs of infection.  Do not lift anything that is heavier than 10 lb (4.5 kg), or the limit that you are told, until your health care provider says that it  is safe. This information is not intended to replace advice given to you by your health care provider. Make sure you discuss any questions you have with your health care provider. Document Revised: 11/13/2017 Document Reviewed: 11/13/2017 Elsevier Patient Education  2021 [place]. Moderate Conscious Sedation, Adult, Care After This sheet gives you information about how to care for yourself after your procedure. Your health care provider may also give you more specific instructions. If you have problems or questions, contact your health care provider. What can I expect after the procedure? After the procedure, it is common to have:  Sleepiness for several hours.  Impaired judgment for several hours.  Difficulty with balance.  Vomiting if you eat too soon. Follow these instructions at home: For the time period you were told by your health care provider:  Rest.  Do not participate in activities where you could fall or become injured.  Do not drive or use machinery.  Do not drink alcohol.  Do not take sleeping pills or medicines that cause drowsiness.  Do not make important decisions or sign legal documents.  Do not take care of children on your own.      Eating and drinking  Follow the diet recommended by your health care provider.  Drink enough fluid to keep your urine pale yellow.  If you vomit: ? Drink water, juice, or soup when you can drink without vomiting. ? Make sure you have little or no nausea before eating solid foods.   General instructions  Take over-the-counter and prescription medicines only as told by your health care provider.  Have a responsible adult stay with you for the time you are told. It is important to have someone help care for you until you are awake and alert.  Do not smoke.  Keep all follow-up visits as told by your health care provider. This is important. Contact a health care provider if:  You are still sleepy or having trouble  with balance after 24 hours.  You feel light-headed.  You keep feeling nauseous or you keep vomiting.  You develop a rash.  You have a fever.  You have redness or swelling around the IV site. Get help r Moderate Conscious Sedation, Adult, Care After This sheet gives you information about how to care for yourself after your procedure. Your health care provider may also give you more specific instructions. If you have problems or questions, contact your health care provider. What can I expect after the procedure? After the procedure, it is common to have:  Sleepiness for several hours.  Impaired judgment for several hours.  Difficulty with balance.  Vomiting if you eat too soon. Follow these instructions at home: For the time period you were told by your health care provider:  Rest.  Do not participate in activities where you could fall or become injured.  Do  not drive or use machinery.  Do not drink alcohol.  Do not take sleeping pills or medicines that cause drowsiness.  Do not make important decisions or sign legal documents.  Do not take care of children on your own.      Eating and drinking  Follow the diet recommended by your health care provider.  Drink enough fluid to keep your urine pale yellow.  If you vomit: ? Drink water, juice, or soup when you can drink without vomiting. ? Make sure you have little or no nausea before eating solid foods.   General instructions  Take over-the-counter and prescription medicines only as told by your health care provider.  Have a responsible adult stay with you for the time you are told. It is important to have someone help care for you until you are awake and alert.  Do not smoke.  Keep all follow-up visits as told by your health care provider. This is important. Contact a health care provider if:  You are still sleepy or having trouble with balance after 24 hours.  You feel light-headed.  You keep feeling  nauseous or you keep vomiting.  You develop a rash.  You have a fever.  You have redness or swelling around the IV site. Get help right away if:  You have trouble breathing.  You have new-onset confusion at home. Summary  After the procedure, it is common to feel sleepy, have impaired judgment, or feel nauseous if you eat too soon.  Rest after you get home. Know the things you should not do after the procedure.  Follow the diet recommended by your health care provider and drink enough fluid to keep your urine pale yellow.  Get help right away if you have trouble breathing or new-onset confusion at home. This information is not intended to replace advice given to you by your health care provider. Make sure you discuss any questions you have with your health care provider. Document Revised: 02/05/2020 Document Reviewed: 09/03/2019 Elsevier Patient Education  2021 [place] away if:  You have trouble breathing.  You have new-onset confusion at home. Summary  After the procedure, it is common to feel sleepy, have impaired judgment, or feel nauseous if you eat too soon.  Rest after you get home. Know the things you should not do after the procedure.  Follow the diet recommended by your health care provider and drink enough fluid to keep your urine pale yellow.  Get help right away if you have trouble breathing or new-onset confusion at home. This information is not intended to replace advice given to you by your health care provider. Make sure you discuss any questions you have with your health care provider. Document Revised: 02/05/2020 Document Reviewed: 09/03/2019 Elsevier Patient Education  2021 [employer].

## 2021-03-21 NOTE — Telephone Encounter (Signed)
Last apt on 03/08/2021 next on 06/13/2021

## 2021-03-24 ENCOUNTER — Telehealth: Payer: Self-pay | Admitting: Pharmacist

## 2021-03-24 NOTE — Chronic Care Management (AMB) (Signed)
Error

## 2021-03-24 NOTE — Progress Notes (Deleted)
Cardiology Office Note    Date:  03/24/2021   ID:  Kim Buchanan, DOB [birth year], MRN [id number]  PCP:  Kim Billings, NP  Cardiologist:  Kim Rogue, MD  Electrophysiologist:  Kim Epley, MD   Chief Complaint: Follow up  History of Present Illness:   Kim Buchanan is a 70 y.o. female with history of nonobstructive CAD, HFrEF secondary toNICM,COPD secondary to tobacco use quitting in 11/2018,restrictive lung disease,DM2, HLD, chronic dyspnea, obesity, untreated sleep apnea, GERD, anxiety, and depression who presents for follow-up of RHC.  She was previously followed by Kim Buchanan underwent echo in 03/2019 which showed normal LV systolic function. Stress testing was undertaken in the setting of ongoing dyspnea which showed anterior ischemia. Following this, there was recommendation for coronary CTA though the patient did not follow-up and was subsequently evaluated by pulmonology. In this setting,she was referred to Kim Buchanan who saw the patient in 07/2019 for follow-up of the above prior cardiac testing. Records were not available at that time, therefore, sheunderwent repeat echo in 08/2019,which revealed an EF of 25 to 30%, global hypokinesis, and grade 1 diastolic dysfunction. There was no significant valvular disease. Given her new cardiomyopathy, she underwent diagnostic cardiac cath in 08/2019 which revealed a 70% stenosis in the proximal RCA. iFR was normal at 0.97. Medical therapy was recommended. Of note, LV gram was performed and showed an EF of 50 to 55%. Right heart cath showed normal filling pressures. She was seen in the office in 09/2019 and was doing well from a cardiac perspective. She was noted to have resolution of cough following discontinuation of losartan. She was started on low-dose carvedilol and referred to cardiopulmonary rehab.   She was seen in the office on 05/19/2020,and noted stable symptoms over the past couple of years.  She followed up with pulmonology with lung disease felt to be less likely the etiology of her symptoms, with mild restrictive physiology noted on PFTs. She was unable to participate in cardiopulmonary rehab secondary to dyspnea. It was noted she did not tolerate Coreg secondary to cough and self discontinued it. She underwent echo, per pulmonology on 05/30/2020, which showed an EF of 30-35%, Gr2DD, normal RV cavity size, and mild MR.She was scheduled for coronary CTA on 06/09/2020, whichwas unable to be performed secondary to heart rates.She was able to undergo calcium scoring with a calcium score of 2.67 which placed the patient in the 49th percentile for age and sex matched control.She was seen in follow up on 8/20/2021and continued to feel about the same with ongoing exertional dyspnea. She subsequently underwent coronary CTA on 06/30/2020 which showed nonobstructive CAD as outlined below. In this setting, she was started on losartan to further optimize her medical therapy. She was seen by pulmonology on 07/12/2020,in follow up with noted minimal obstructive, if any, and mild restrictive lung disease. She was seen in the office on 07/20/2020 and continued to feel about the same with stable exertional dyspnea that has been present for the past several years. She did not notice any changes in her dyspnea following the addition of inhaler therapy by pulmonology. With continued PVCs, Toprol-XL was titrated to 37.5 mg daily. She was also started on spironolactone 12.5 mg.She was seen in the office on 08/22/2020 noting a 3-day history of increased chest congestion, shortness of breath, and nonproductive cough. Her weight was stable. Lasix 20 mg daily for 5 days was added. I last saw her on 09/07/2020 at which time she noted an improvement  in her dyspnea while on scheduled low-dose Lasix 20 mg daily with a return in her dyspnea and exertional fatigue while being off this medication. In this setting, she  was placed on scheduled low-dose Lasix 20 mg daily.  She was seen by Kim Buchanan on 10/05/2020 for further evaluation of her NICM and left bundle branch block. She was placed on Entresto with recommendation to follow-up echo in approximately 3 months. If her cardiomyopathy persisted CRT-D was recommended. She was also referred to the advanced heart failure clinic in [place].  She was seen in 10/2020 for follow-up and it was noted she did not tolerate Entresto secondary to cough and self discontinued this medication. She had concerns about the affordability of [name], though with patient assistance paperwork this was subsequently approved. She was mildly volume up and was advised to increase Lasix to 40 mg daily for 3 days followed by 20 mg daily thereafter. She was placed back on losartan.  She was seen by EP in 12/2020 with continued volume overload and shortness of breath. Echo in 12/2020 showed a persistent cardiomyopathy with an EF of 30 to 35%, global hypokinesis, indeterminate LV diastolic function parameters, normal RV systolic function and ventricular cavity size, and mild mitral valve regurgitation. Her weight was up 4 pounds when compared to her visit in 10/2020. Her Lasix was increased to 40 mg daily and losartan was titrated to 25 mg daily.  She was seen in the office on 01/24/2021 by Kim Mood, PA-C with a weight that was down 1 pound when compared to her visit in 12/2020. She continued to note shortness of breath and reported having recently been seen by her PCP with chest x-ray without evidence of pleural effusions or vascular congestion. She was advised at PCP's office toincreaseLasixto60 mg brieflywith noted slight improvement in symptoms after 1 dose. At her visit with cardiology, she indicated she had not taken her Lasix for several days and was taking this on a as needed basis rather than the recommended 40 mg daily. BP was soft at 100/76. Her losartan was held  on 4/5 secondary to soft BP, and she was placed on torsemide 20 mg daily in place of Lasix 40 mg daily.  I saw her in follow up on 01/27/2021, at which time she reported feeling slightly better. Her weight was down 3 pounds from 4/5 visit. She was adherent to torsemide 20 mg daily. She did note a slight improvement in symptoms.She had also cut back on her fluid consumption. Follow up labs obtained at that time showed an up-trending BUN/SCr. In this setting, she was advised to hold torsemide x 1 day then resume at 20 mg every other day.   I saw her on 02/10/2021, at which time she felt about the same as she has over her previous visits.  She was adherent to torsemide 20 mg every other day.  She had increased her fluid intake.  Her weight was up 1 pound when compared to her visit on 4/8.  She did continue to appear mildly volume up.  Metolazone 2.5 mg daily for 2 days was added along with extra KCl to take with her torsemide.  She was last seen on 03/13/2021 with unchanged shortness of breath.  She did not note significant urine output following addition of metolazone.  Her weight was down 3 pounds when compared to her last visit.  Given persistent symptoms, she underwent RHC on 03/21/2021 which showed mildly elevated left heart filling pressure, severely elevated right heart  filling pressure, mildly elevated PASP with increased pulmonary vascular resistance, and severely reduced cardiac output/index.  Findings were suggestive of a component of right heart failure.  Torsemide was increased to 20 mg daily.  Toprol-XL was decreased to 25 mg daily.  She was referred to the advanced heart failure clinic.  ***   Labs independently reviewed: 02/2021 - BUN 18, serum creatinine 0.82, potassium 4.6, HGB 14.4, PLT 327 01/2021-TC 79, TG 215, HDL 31, LDL 15, albumin 4.0, AST 76, ALT 116, A1c 8.6 12/2020-magnesium 2.1 09/2020-TSH normal  Past Medical History:  Diagnosis Date  . Anxiety   . Arthritis     lower back  . COPD (chronic obstructive pulmonary disease) ([place])   . Depression   . Diabetes mellitus without complication ([place])   . GERD (gastroesophageal reflux disease)   . Hyperlipidemia   . Morbid obesity ([place])   . NICM (nonischemic cardiomyopathy) ([place])    a. 03/2019 Echo: Nl LVEF; b. 09/04/2019 Echo: EF 25-30%, Gr1 DD, glob HK, nl RV fxn; c. 09/11/2019 Cath: nonobs LAD/RCA dzs. EF 50-55%. RA 10, RV 29/8(11), PA 27/16(20), PCWP 12. CO/CI 5.04/2.51.  Marland Kitchen Nonobstructive CAD (coronary artery disease)    a. 03/2019 [facility]): EF 63%, ant ischemia; b. 08/2019 Cath: LM nl, LAD 67m, RCA 70p (iFR nl @ 0.97), 50p. EF 50-55%-->Med Rx.  . Osteoporosis   . Panic attack   . PTSD (post-traumatic stress disorder)   . Rectal fistula   . Sleep apnea    a. Prev wore CPAP but lost insurance and hasn't been using since (now has insurance).  . Wears dentures    full upper    Past Surgical History:  Procedure Laterality Date  . ABDOMINAL HYSTERECTOMY    . CARDIAC CATHETERIZATION    . COLONOSCOPY  2014  . COLONOSCOPY WITH PROPOFOL N/A 10/01/2016   Procedure: COLONOSCOPY WITH PROPOFOL;  Surgeon: Lucilla Lame, MD;  Location: [facility];  Service: Endoscopy;  Laterality: N/A;  . CORONARY PRESSURE WIRE/FFR WITH 3D MAPPING N/A 09/11/2019   Procedure: Coronary Pressure Wire/FFR w/3D Mapping;  Surgeon: Nelva Bush, MD;  Location: [facility];  Service: Cardiovascular;  Laterality: N/A;  . CYSTOCELE REPAIR N/A 12/17/2016   Procedure: ANTERIOR REPAIR (CYSTOCELE);  Surgeon: Brayton Mars, MD;  Location: [facility];  Service: Gynecology;  Laterality: N/A;  . ESOPHAGOGASTRODUODENOSCOPY (EGD) WITH PROPOFOL N/A 08/11/2019   Procedure: ESOPHAGOGASTRODUODENOSCOPY (EGD) WITH PROPOFOL;  Surgeon: Lucilla Lame, MD;  Location: [facility];  Service: Endoscopy;  Laterality: N/A;  . POLYPECTOMY  10/01/2016   Procedure: POLYPECTOMY;  Surgeon: Lucilla Lame, MD;  Location: [facility];   Service: Endoscopy;;  . RECTAL SURGERY  06/28/2015   Rectal prolapse, laparoscopic rectopexy the coldCharmont, MD; [facility]  . RIGHT HEART CATH N/A 03/21/2021   Procedure: RIGHT HEART CATH;  Surgeon: Nelva Bush, MD;  Location: [facility];  Service: Cardiovascular;  Laterality: N/A;  . RIGHT/LEFT HEART CATH AND CORONARY ANGIOGRAPHY Bilateral 09/11/2019   Procedure: RIGHT/LEFT HEART CATH AND CORONARY ANGIOGRAPHY;  Surgeon: [name], MD;  Location: [facility];  Service: Cardiovascular;  Laterality: Bilateral;  . TUBAL LIGATION    . VAGINAL HYSTERECTOMY Bilateral 12/17/2016   Procedure: HYSTERECTOMY VAGINAL WITH BILATERAL SALPINGO OOPHERECTOMY;  Surgeon: Brayton Mars, MD;  Location: [facility];  Service: Gynecology;  Laterality: Bilateral;    Current Medications: No outpatient medications have been marked as taking for the 03/28/21 encounter (Appointment) with Rise Mu, PA-C.    Allergies:  Lisinopril   Social History   Socioeconomic History  . Marital status: Married    Spouse name: Paediatric nurse  . Number of children: 1  . Years of education: Not on file  . Highest education level: 9th grade  Occupational History    Employer: DISABLED    Comment: for panic attacks  Tobacco Use  . Smoking status: Former Smoker    Packs/day: 1.50    Years: 50.00    Pack years: 75.00    Types: Cigarettes    Quit date: 11/29/2018    Years since quitting: 2.3  . Smokeless tobacco: Never Used  . Tobacco comment: started at age 5  Vaping Use  . Vaping Use: Never used  Substance and Sexual Activity  . Alcohol use: No    Alcohol/week: 0.0 standard drinks  . Drug use: No  . Sexual activity: Not Currently  Other Topics Concern  . Not on file  Social History Narrative   Pt had one son, who died when was 12 YO in a MVA.   Social Determinants of Health   Financial Resource Strain: Low Risk   . Difficulty of Paying Living Expenses: Not hard at all  Food Insecurity: No  Food Insecurity  . Worried About Charity fundraiser in the Last Year: Never true  . Ran Out of Food in the Last Year: Never true  Transportation Needs: No Transportation Needs  . Lack of Transportation (Medical): No  . Lack of Transportation (Non-Medical): No  Physical Activity: Inactive  . Days of Exercise per Week: 0 days  . Minutes of Exercise per Session: 0 min  Stress: No Stress Concern Present  . Feeling of Stress : Not at all  Social Connections: Not on file     Family History:  The patient's family history includes Cancer in her sister; Diabetes in her brother, brother, mother, sister, and sister; Healthy in her sister; Heart disease in her brother and father; Hypertension in her father; Lymphoma in her brother and brother. There is no history of Breast cancer, Ovarian cancer, or Colon cancer.  ROS:   ROS   EKGs/Labs/Other Studies Reviewed:    Studies reviewed were summarized above. The additional studies were reviewed today:  [facility] 03/21/2021: Conclusions: 1. Mildly elevated left heart filling pressure (mean PCWP 20 mmHg). 2. Severely elevated right heart filling pressure (mean RAP 17 mmHg, RVEDP 19 mmHg). 3. Mildly elevated pulmonary artery pressure (mean PAP 28 mmHg) with increased pulmonary vascular resistance (PVR 2.9 WU). 4. Severely reduced Fick cardiac output/index (CO 2.9, CI 1.3).  Low PAPI suggests significant component of right heart failure.  Recommendations: 1. Increase torsemide to 20 mg daily. 2. Decrease metoprolol succinate to 25 mg daily. 3. Refer to advanced heart failure clinic for expedited evaluation. __________  2D echo 12/20/2020: 1. Left ventricular ejection fraction, by estimation, is 30 to 35%. The  left ventricle has moderately decreased function. The left ventricle  demonstrates global hypokinesis. There is mild left ventricular  hypertrophy. Left ventricular diastolic  parameters are indeterminate.  2. Right ventricular systolic  function is normal. The right ventricular  size is normal. Tricuspid regurgitation signal is inadequate for assessing  PA pressure.  3. The mitral valve is normal in structure. Mild mitral valve  regurgitation. No evidence of mitral stenosis. __________  2D echo 09/09/2020: 1. Left ventricular ejection fraction, by estimation, is 30 to 35%. The  left ventricle has moderately decreased function. The left ventricle  demonstrates regional wall motion abnormalities (Anterior and  anteroseptal  wall hypokinesis). The average left  ventricular global longitudinal strain is -6.8 %. The global longitudinal  strain is abnormal.  2. Right ventricular systolic function is normal. The right ventricular  size is normal. Tricuspid regurgitation signal is inadequate for assessing  PA pressure.  3. The mitral valve is normal in structure. Mild mitral valve  regurgitation.  __________  Coronary CTA 06/30/2020: Aorta: Normal size. Mild aortic atherosclerosis. No dissection.  Aortic Valve: Trileaflet. No calcifications.  Coronary Arteries: Normal coronary origin. Right dominance.  RCA is a large dominant artery that gives rise to PDA and PLVB. There is mild (25-49%) soft plaque stenosis in the proximal to mid vessel followed by mild (25-49%) mixed plaque stenosis.  Left main is a large artery that gives rise to LAD and LCX arteries.  LAD is a large vessel that gives rise to medium size D1 and D2 and small D3; there is long, soft plaque stenosis in the mid vessel mild (25-49%) followed by moderate (50-69%).  LCX is a non-dominant artery that gives rise to one large OM1 branch. There is no plaque.  Other findings:  Normal pulmonary vein drainage into the left atrium.  Normal let atrial appendage without a thrombus.  Normal size of the pulmonary artery.  IMPRESSION: 1. Coronary calcium score of 39.3. This was 9 percentile for age and sex matched control.  2. Normal  coronary origin with right dominance.  3. Moderate (50-69%) soft plaque stenosis in the mid LAD; mild (25-49%) soft and mixed plaque stenoses in the proximal to mid RCA.  4. Study will be sent for FFR. __________  FFR 07/01/2020: 1. Left Main: findings 0.99, 0.99  2. LAD: findings 0.99, 0.97 0.92  3. OM: Findings 0.98, 0.88 0.84  4. RCA: findings 0.97, 0.81 0.84  IMPRESSION: FFR not suggestive of significant obstructive disease. __________  Coronary CTA 06/09/2020: Unable to be performed secondary to heart rate -Calcium score 2.67 which places the patient in the 49th percentile for age and sex matched control __________   2D echo 05/30/2020: 1. Left ventricular ejection fraction, by estimation, is 30 to 35%. The  left ventricle has moderately decreased function. Left ventricular  endocardial border not optimally defined to evaluate regional wall motion.  Left ventricular diastolic parameters  are consistent with Grade II diastolic dysfunction (pseudonormalization).  2. Right ventricular systolic function was not well visualized. The right  ventricular size is normal. Tricuspid regurgitation signal is inadequate  for assessing PA pressure.  3. The mitral valve is normal in structure. Mild mitral valve  regurgitation. No evidence of mitral stenosis.  4. The aortic valve was not well visualized. Aortic valve regurgitation  is not visualized. No aortic stenosis is present.  5. The inferior vena cava is normal in size with greater than 50%  respiratory variability, suggesting right atrial pressure of 3 mmHg.  6. Challenging image quality. __________  2D echo 08/2019: 1. Left ventricular ejection fraction, by visual estimation, is 25 to  30%. The left ventricle has moderate to severely decreased function. There  is no left ventricular hypertrophy.  2. Left ventricular diastolic parameters are consistent with Grade I  diastolic dysfunction (impaired  relaxation).  3. The left ventricle demonstrates global hypokinesis.  4. Global right ventricle has normal systolic function.The right  ventricular size is normal. No increase in right ventricular wall  thickness.  5. Left atrial size was normal.  6. TR signal is inadequate for assessing pulmonary artery systolic  Pressure. __________  [facility] 08/2019:  Coronary angiography:  Coronary dominance: Right  Left mainstem: Large vessel that bifurcates into the LAD and left circumflex, no significant disease noted  Left anterior descending (LAD): Large vessel that extends to the apical region, diagonal branch 2 of moderate size, mild proximal LAD disease estimated at 30%  Left circumflex (LCx): Large vessel with OM branch 2, no significant disease noted  Right coronary artery (RCA): Right dominant vessel with PL and PDA, 70% proximal RCA disease followed by sequential 50% lesion proximal to mid vessel  Left ventriculography: Left ventricular systolic function is normal, LVEF is estimated at 50 to 55%, there is no significant mitral regurgitation , no significant aortic valve stenosis  Right heart pressures RA 10 RV 29/8, 11 PA 27/16 mean 20 Wedge 12 Cardiac output 5.04 Cardiac index 2.51  Final Conclusions:  Single-vessel disease RCA Discussed with interventional cardiology, they will perform FFR to determine if intervention needed Right heart pressures are normal   Recommendations:  We will defer coronary intervention to interventional cardiology Needs weight loss, management of COPD Management of hyperlipidemia __________  iFR 08/2019:  Conclusions: 1. Single vessel coronary with sequential 70% and 50% proximal and mid RCA stenoses that are not hemodynamically significant (iFR = 0.97). See Dr. Donivan Scull diagnostic catheterization report for further details of right/left heart catheterization.  Recommendations: 1. Medical therapy and risk factor modification  to prevent progression of disease.   EKG:  EKG is ordered today.  The EKG ordered today demonstrates ***  Recent Labs: 10/17/2020: TSH 2.440 01/23/2021: ALT 116 01/24/2021: BNP 67.0 01/27/2021: Magnesium 2.0 03/08/2021: BUN 18; Creatinine, Ser 0.82; Potassium 4.6; Sodium 141 03/13/2021: Hemoglobin 14.4; Platelets 327  Recent Lipid Panel    Component Value Date/Time   CHOL 79 (L) 01/23/2021 1511   TRIG 215 (H) 01/23/2021 1511   HDL 31 (L) 01/23/2021 1511   CHOLHDL 1.5 11/30/2019 1135   CHOLHDL 1.7 01/01/2018 0000   VLDL 29 08/06/2016 0824   LDLCALC 15 01/23/2021 1511   LDLCALC 29 01/01/2018 0000    PHYSICAL EXAM:    VS:  There were no vitals taken for this visit.  BMI: There is no height or weight on file to calculate BMI.  Physical Exam  Wt Readings from Last 3 Encounters:  03/21/21 242 lb (109.8 kg)  03/13/21 239 lb (108.4 kg)  03/08/21 242 lb (109.8 kg)     ASSESSMENT & PLAN:   1. HFrEF secondary to NICM with underlying LBBB:  2. Nonobstructive CAD:  3. PVCs:  4. HLD: LDL of 15 in 01/2021.  5. COPD/restrictive lung disease:  6. Sleep apnea:  7. Morbid obesity with deconditioning:  8. Diabetes:  Disposition: F/u with Kim Buchanan or an APP in ***, and EP as directed.   Medication Adjustments/Labs and Tests Ordered: Current medicines are reviewed at length with the patient today.  Concerns regarding medicines are outlined above. Medication changes, Labs and Tests ordered today are summarized above and listed in the Patient Instructions accessible in Encounters.   Signed, Christell Faith, PA-C 03/24/2021 1:42 PM     [address] [place], [place] [address] [phone]

## 2021-03-28 ENCOUNTER — Ambulatory Visit: Payer: Medicare Other | Admitting: Physician Assistant

## 2021-03-28 NOTE — Progress Notes (Signed)
Cardiology Office Note    Date:  03/29/2021   ID:  LUCY BOARDMAN, DOB 1951-04-02, MRN [id number]  PCP:  [name], NP  Cardiologist:  Ida Rogue, MD  Electrophysiologist:  [name], MD   Chief Complaint: Follow-up  History of Present Illness:   Kim Buchanan is a 70 y.o. female with history of nonobstructive CAD, HFrEF secondary toNICM,COPD secondary to tobacco use quitting in 11/2018,restrictive lung disease,DM2, HLD, chronic dyspnea, obesity, untreated sleep apnea, GERD, anxiety, and depression who presents for follow-up of RHC.  She was previously followed by Dr. Jenita Seashore underwent echo in 03/2019 which showed normal LV systolic function. Stress testing was undertaken in the setting of ongoing dyspnea which showed anterior ischemia. Following this, there was recommendation for coronary CTA though the patient did not follow-up and was subsequently evaluated by pulmonology. In this setting,she was referred to Dr. Rockey Situ who saw the patient in 07/2019 for follow-up of the above prior cardiac testing. Records were not available at that time, therefore, sheunderwent repeat echo in 08/2019,which revealed an EF of 25 to 30%, global hypokinesis, and grade 1 diastolic dysfunction. There was no significant valvular disease. Given her new cardiomyopathy, she underwent diagnostic cardiac cath in 08/2019 which revealed a 70% stenosis in the proximal RCA. iFR was normal at 0.97. Medical therapy was recommended. Of note, LV gram was performed and showed an EF of 50 to 55%. Right heart cath showed normal filling pressures. She was seen in the office in 09/2019 and was doing well from a cardiac perspective. She was noted to have resolution of cough following discontinuation of losartan. She was started on low-dose carvedilol and referred to cardiopulmonary rehab.   She was seen in the office on 05/19/2020,and noted stable symptoms over the past couple of years.  She followed up with pulmonology with lung disease felt to be less likely the etiology of her symptoms, with mild restrictive physiology noted on PFTs. She was unable to participate in cardiopulmonary rehab secondary to dyspnea. It was noted she did not tolerate Coreg secondary to cough and self discontinued it. She underwent echo, per pulmonology on 05/30/2020, which showed an EF of 30-35%, Gr2DD, normal RV cavity size, and mild MR.She was scheduled for coronary CTA on 06/09/2020, whichwas unable to be performed secondary to heart rates.She was able to undergo calcium scoring with a calcium score of 2.67 which placed the patient in the 49th percentile for age and sex matched control.She was seen in follow up on 8/20/2021and continued to feel about the same with ongoing exertional dyspnea. She subsequently underwent coronary CTA on 06/30/2020 which showed nonobstructive CAD as outlined below. In this setting, she was started on losartan to further optimize her medical therapy. She was seen by pulmonology on 07/12/2020,in follow up with noted minimal obstructive, if any, and mild restrictive lung disease. She was seen in the office on 07/20/2020 and continued to feel about the same with stable exertional dyspnea that has been present for the past several years. She did not notice any changes in her dyspnea following the addition of inhaler therapy by pulmonology. With continued PVCs, Toprol-XL was titrated to 37.5 mg daily. She was also started on spironolactone 12.5 mg.She was seen in the office on 08/22/2020 noting a 3-day history of increased chest congestion, shortness of breath, and nonproductive cough. Her weight was stable. Lasix 20 mg daily for 5 days was added. I last saw her on 09/07/2020 at which time she noted an improvement in  her dyspnea while on scheduled low-dose Lasix 20 mg daily with a return in her dyspnea and exertional fatigue while being off this medication. In this setting, she  was placed on scheduled low-dose Lasix 20 mg daily.  She was seen by Dr. Quentin Ore on 10/05/2020 for further evaluation of her NICM and left bundle branch block. She was placed on Entresto with recommendation to follow-up echo in approximately 3 months. If her cardiomyopathy persisted CRT-D was recommended. She was also referred to the advanced heart failure clinic in [place].  She was seen in 10/2020 for follow-up and it was noted she did not tolerate Entresto secondary to cough and self discontinued this medication. She had concerns about the affordability of [name], though with patient assistance paperwork this was subsequently approved. She was volume up and was advised to increase Lasix to 40 mg daily for 3 days followed by 20 mg daily thereafter. She was placed back on losartan.  She was seen by EP in 12/2020 with continued volume overload and shortness of breath. Echo in 12/2020 showed a persistent cardiomyopathy with an EF of 30 to 35%, global hypokinesis, indeterminate LV diastolic function parameters, normal RV systolic function and ventricular cavity size, and mild mitral valve regurgitation. Her weight was up 4 pounds when compared to her visit in 10/2020. Her Lasix was increased to 40 mg daily and losartan was titrated to 25 mg daily.  She was seen in the office on 01/24/2021 by Marrianne Mood, PA-C with a weight that was down 1 pound when compared to her visit in 12/2020. She continued to note shortness of breath and reported having recently been seen by her PCP with chest x-ray without evidence of pleural effusions or vascular congestion. She was advised at PCP's office toincreaseLasixto60 mg brieflywith noted slight improvement in symptoms after 1 dose. At her visit with cardiology, she indicated she had not taken her Lasix for several days and was taking this on a as needed basis rather than the recommended 40 mg daily. BP was soft at 100/76. Her losartan was held on 4/5  secondary to soft BP, and she was placed on torsemide 20 mg daily in place of Lasix 40 mg daily.  I saw her in follow up on 01/27/2021, at which time she reported feeling slightly better. Her weight was down 3 pounds from 4/5 visit. She was adherent to torsemide 20 mg daily. She did note a slight improvement in symptoms.She had also cut back on her fluid consumption. Follow up labs obtained at that time showed an up-trending BUN/SCr. In this setting, she was advised to hold torsemide x 1 day then resume at 20 mg every other day.   I saw her on 02/10/2021, at which time she felt about the same as she has over her previous visits.  She was adherent to torsemide 20 mg every other day.  She had increased her fluid intake.  Her weight was up 1 pound when compared to her visit on 4/8.  She did continue to appear mildly volume up.  Metolazone 2.5 mg daily for 2 days was added along with extra KCl to take with her torsemide.  She was last seen on 03/13/2021 with unchanged shortness of breath.  She did not note significant urine output following addition of metolazone.  Her weight was down 3 pounds when compared to her last visit.  Given persistent symptoms, she underwent RHC on 03/21/2021 which showed mildly elevated left heart filling pressure, severely elevated right heart filling pressure,  mildly elevated PASP with increased pulmonary vascular resistance, and severely reduced cardiac output/index.  Findings were suggestive of a component of right heart failure.  Torsemide was increased to 20 mg daily.  Toprol-XL was decreased to 25 mg daily.  She was referred to the advanced heart failure clinic.  She comes in today accompanied by her husband and continues same as over her prior visits.  She has not noticed a significant increase in urine output or change in her dyspnea on titrated dose of torsemide.  She has been drinking greater than 2 L of fluid again.  She is watching her salt intake.  Her weight  remains stable by our scales.  No chest pain, palpitations, dizziness, presyncope, or syncope.  She has stable two-pillow orthopnea.  No PND or early satiety.   Labs independently reviewed: 02/2021 - BUN 18, serum creatinine 0.82, potassium 4.6, HGB 14.4, PLT 327 01/2021-TC 79, TG 215, HDL 31, LDL 15, albumin 4.0, AST 76, ALT 116, A1c 8.6 12/2020-magnesium 2.1 09/2020-TSH normal    Past Medical History:  Diagnosis Date  . Anxiety   . Arthritis    lower back  . COPD (chronic obstructive pulmonary disease) ([place])   . Depression   . Diabetes mellitus without complication ([place])   . GERD (gastroesophageal reflux disease)   . Hyperlipidemia   . Morbid obesity ([place])   . NICM (nonischemic cardiomyopathy) ([place])    a. 03/2019 Echo: Nl LVEF; b. 09/04/2019 Echo: EF 25-30%, Gr1 DD, glob HK, nl RV fxn; c. 09/11/2019 Cath: nonobs LAD/RCA dzs. EF 50-55%. RA 10, RV 29/8(11), PA 27/16(20), PCWP 12. CO/CI 5.04/2.51.  Marland Kitchen Nonobstructive CAD (coronary artery disease)    a. 03/2019 [facility]): EF 63%, ant ischemia; b. 08/2019 Cath: LM nl, LAD 64m RCA 70p (iFR nl @ 0.97), 50p. EF 50-55%-->Med Rx.  . Osteoporosis   . Panic attack   . PTSD (post-traumatic stress disorder)   . Rectal fistula   . Sleep apnea    a. Prev wore CPAP but lost insurance and hasn't been using since (now has insurance).  . Wears dentures    full upper    Past Surgical History:  Procedure Laterality Date  . ABDOMINAL HYSTERECTOMY    . CARDIAC CATHETERIZATION    . COLONOSCOPY  2014  . COLONOSCOPY WITH PROPOFOL N/A 10/01/2016   Procedure: COLONOSCOPY WITH PROPOFOL;  Surgeon: DLucilla Lame MD;  [facility]  Service: Endoscopy;  Laterality: N/A;  . CORONARY PRESSURE WIRE/FFR WITH 3D MAPPING N/A 09/11/2019   Procedure: Coronary Pressure Wire/FFR w/3D Mapping;  Surgeon: ENelva Bush MD;  [facility];  Service: Cardiovascular;  Laterality: N/A;  . CYSTOCELE REPAIR N/A 12/17/2016   Procedure:  ANTERIOR REPAIR (CYSTOCELE);  Surgeon: MBrayton Mars MD;  Location: [facility];  Service: Gynecology;  Laterality: N/A;  . ESOPHAGOGASTRODUODENOSCOPY (EGD) WITH PROPOFOL N/A 08/11/2019   Procedure: ESOPHAGOGASTRODUODENOSCOPY (EGD) WITH PROPOFOL;  Surgeon: WLucilla Lame MD;  Location: [facility];  Service: Endoscopy;  Laterality: N/A;  . POLYPECTOMY  10/01/2016   Procedure: POLYPECTOMY;  Surgeon: DLucilla Lame MD;  [facility]  Service: Endoscopy;;  . RECTAL SURGERY  06/28/2015   Rectal prolapse, laparoscopic rectopexy the coldCharmont, MD; [facility]  . RIGHT HEART CATH N/A 03/21/2021   Procedure: RIGHT HEART CATH;  Surgeon: ENelva Bush MD;  [facility];  Service: Cardiovascular;  Laterality: N/A;  . RIGHT/LEFT HEART CATH AND CORONARY ANGIOGRAPHY Bilateral 09/11/2019   Procedure: RIGHT/LEFT HEART CATH AND  CORONARY ANGIOGRAPHY;  Surgeon: [name], MD;  Location: [facility];  Service: Cardiovascular;  Laterality: Bilateral;  . TUBAL LIGATION    . VAGINAL HYSTERECTOMY Bilateral 12/17/2016   Procedure: HYSTERECTOMY VAGINAL WITH BILATERAL SALPINGO OOPHERECTOMY;  Surgeon: Brayton Mars, MD;  Location: [facility];  Service: Gynecology;  Laterality: Bilateral;    Current Medications: Current Meds  Medication Sig  . aspirin 81 MG tablet Take 81 mg by mouth daily.   Marland Kitchen atorvastatin (LIPITOR) 10 MG tablet Take 1 tablet (10 mg total) by mouth at bedtime.  . blood glucose meter kit and supplies Dispense based on patient and insurance preference. Check fasting blood sugar once daily. (FOR ICD E11.69).  . dapagliflozin propanediol (FARXIGA) 10 MG TABS tablet Take 1 tablet (10 mg total) by mouth daily before breakfast.  . Dulaglutide (TRULICITY) 7.07 EM/7.5QG SOPN Inject 0.75 mg into the skin once a week.  . gabapentin (NEURONTIN) 300 MG capsule Take 1 capsule (300 mg total) by mouth 3 (three) times daily.  . isosorbide mononitrate (IMDUR) 30  MG 24 hr tablet TAKE 1 TABLET BY MOUTH  DAILY  . Lancets (ONETOUCH DELICA PLUS [id number]) MISC 1 Units by Other route daily.  . metFORMIN (GLUCOPHAGE) 1000 MG tablet Take 1 tablet (1,000 mg total) by mouth 2 (two) times daily with a meal.  . metoprolol succinate (TOPROL-XL) 25 MG 24 hr tablet Take 1 tablet (25 mg total) by mouth daily.  Glory Rosebush ULTRA test strip 1 each by Other route daily.  . pantoprazole (PROTONIX) 40 MG tablet Take 1 tablet (40 mg total) by mouth daily.  . polyethylene glycol powder (GLYCOLAX/MIRALAX) 17 GM/SCOOP powder Take 17 g by mouth daily.  . potassium chloride (KLOR-CON) 10 MEQ tablet Take 1 tablet (10 mEq total) by mouth as directed. Take with your Torsemide.  Marland Kitchen QUEtiapine (SEROQUEL) 100 MG tablet TAKE 1 TABLET BY MOUTH AT  BEDTIME  . spironolactone (ALDACTONE) 25 MG tablet Take 12.5 mg by mouth daily.  Marland Kitchen torsemide (DEMADEX) 20 MG tablet Take 1 tablet (20 mg total) by mouth daily. Take Potassium with Torsemide.    Allergies:   Lisinopril   Social History   Socioeconomic History  . Marital status: Married    Spouse name: Paediatric nurse  . Number of children: 1  . Years of education: Not on file  . Highest education level: 9th grade  Occupational History    Employer: DISABLED    Comment: for panic attacks  Tobacco Use  . Smoking status: Former Smoker    Packs/day: 1.50    Years: 50.00    Pack years: 75.00    Types: Cigarettes    Quit date: 11/29/2018    Years since quitting: 2.3  . Smokeless tobacco: Never Used  . Tobacco comment: started at age 74  Vaping Use  . Vaping Use: Never used  Substance and Sexual Activity  . Alcohol use: No    Alcohol/week: 0.0 standard drinks  . Drug use: No  . Sexual activity: Not Currently  Other Topics Concern  . Not on file  Social History Narrative   Pt had one son, who died when was 7 YO in a MVA.   Social Determinants of Health   Financial Resource Strain: Low Risk   . Difficulty of Paying Living Expenses: Not  hard at all  Food Insecurity: No Food Insecurity  . Worried About Charity fundraiser in the Last Year: Never true  . Ran Out of Food in the  Last Year: Never true  Transportation Needs: No Transportation Needs  . Lack of Transportation (Medical): No  . Lack of Transportation (Non-Medical): No  Physical Activity: Inactive  . Days of Exercise per Week: 0 days  . Minutes of Exercise per Session: 0 min  Stress: No Stress Concern Present  . Feeling of Stress : Not at all  Social Connections: Not on file     Family History:  The patient's family history includes Cancer in her sister; Diabetes in her brother, brother, mother, sister, and sister; Healthy in her sister; Heart disease in her brother and father; Hypertension in her father; Lymphoma in her brother and brother. There is no history of Breast cancer, Ovarian cancer, or Colon cancer.  ROS:   Review of Systems  Constitutional: Positive for malaise/fatigue. Negative for chills, diaphoresis, fever and weight loss.  HENT: Negative for congestion.   Eyes: Negative for discharge and redness.  Respiratory: Positive for shortness of breath. Negative for cough, sputum production and wheezing.   Cardiovascular: Positive for orthopnea. Negative for chest pain, palpitations, claudication, leg swelling and PND.       Stable two-pillow orthopnea  Gastrointestinal: Negative for abdominal pain, blood in stool, heartburn, melena, nausea and vomiting.  Musculoskeletal: Negative for falls and myalgias.  Skin: Negative for rash.  Neurological: Positive for weakness. Negative for dizziness, tingling, tremors, sensory change, speech change, focal weakness and loss of consciousness.  Endo/Heme/Allergies: Does not bruise/bleed easily.  Psychiatric/Behavioral: Negative for substance abuse. The patient is not nervous/anxious.   All other systems reviewed and are negative.    EKGs/Labs/Other Studies Reviewed:    Studies reviewed were summarized above.  The additional studies were reviewed today:  [place] 03/21/2021: Conclusions: 1. Mildly elevated left heart filling pressure (mean PCWP 20 mmHg). 2. Severely elevated right heart filling pressure (mean RAP 17 mmHg, RVEDP 19 mmHg). 3. Mildly elevated pulmonary artery pressure (mean PAP 28 mmHg) with increased pulmonary vascular resistance (PVR 2.9 WU). 4. Severely reduced Fick cardiac output/index (CO 2.9, CI 1.3).  Low PAPI suggests significant component of right heart failure.  Recommendations: 1. Increase torsemide to 20 mg daily. 2. Decrease metoprolol succinate to 25 mg daily. 3. Refer to advanced heart failure clinic for expedited evaluation. __________  2D echo 12/20/2020: 1. Left ventricular ejection fraction, by estimation, is 30 to 35%. The  left ventricle has moderately decreased function. The left ventricle  demonstrates global hypokinesis. There is mild left ventricular  hypertrophy. Left ventricular diastolic  parameters are indeterminate.  2. Right ventricular systolic function is normal. The right ventricular  size is normal. Tricuspid regurgitation signal is inadequate for assessing  PA pressure.  3. The mitral valve is normal in structure. Mild mitral valve  regurgitation. No evidence of mitral stenosis. __________  2D echo 09/09/2020: 1. Left ventricular ejection fraction, by estimation, is 30 to 35%. The  left ventricle has moderately decreased function. The left ventricle  demonstrates regional wall motion abnormalities (Anterior and anteroseptal  wall hypokinesis). The average left  ventricular global longitudinal strain is -6.8 %. The global longitudinal  strain is abnormal.  2. Right ventricular systolic function is normal. The right ventricular  size is normal. Tricuspid regurgitation signal is inadequate for assessing  PA pressure.  3. The mitral valve is normal in structure. Mild mitral valve  regurgitation.  __________  Coronary CTA 06/30/2020: Aorta:  Normal size. Mild aortic atherosclerosis. No dissection.  Aortic Valve: Trileaflet. No calcifications.  Coronary Arteries: Normal coronary origin. Right dominance.  RCA  is a large dominant artery that gives rise to PDA and PLVB. There is mild (25-49%) soft plaque stenosis in the proximal to mid vessel followed by mild (25-49%) mixed plaque stenosis.  Left main is a large artery that gives rise to LAD and LCX arteries.  LAD is a large vessel that gives rise to medium size D1 and D2 and small D3; there is long, soft plaque stenosis in the mid vessel mild (25-49%) followed by moderate (50-69%).  LCX is a non-dominant artery that gives rise to one large OM1 branch. There is no plaque.  Other findings:  Normal pulmonary vein drainage into the left atrium.  Normal let atrial appendage without a thrombus.  Normal size of the pulmonary artery.  IMPRESSION: 1. Coronary calcium score of 39.3. This was 29 percentile for age and sex matched control.  2. Normal coronary origin with right dominance.  3. Moderate (50-69%) soft plaque stenosis in the mid LAD; mild (25-49%) soft and mixed plaque stenoses in the proximal to mid RCA.  4. Study will be sent for FFR. __________  FFR 07/01/2020: 1. Left Main: findings 0.99, 0.99  2. LAD: findings 0.99, 0.97 0.92  3. OM: Findings 0.98, 0.88 0.84  4. RCA: findings 0.97, 0.81 0.84  IMPRESSION: FFR not suggestive of significant obstructive disease. __________  Coronary CTA 06/09/2020: Unable to be performed secondary to heart rate -Calcium score 2.67 which places the patient in the 49th percentile for age and sex matched control __________   2D echo 05/30/2020: 1. Left ventricular ejection fraction, by estimation, is 30 to 35%. The  left ventricle has moderately decreased function. Left ventricular  endocardial border not optimally defined to evaluate regional wall motion.  Left ventricular diastolic  parameters  are consistent with Grade II diastolic dysfunction (pseudonormalization).  2. Right ventricular systolic function was not well visualized. The right  ventricular size is normal. Tricuspid regurgitation signal is inadequate  for assessing PA pressure.  3. The mitral valve is normal in structure. Mild mitral valve  regurgitation. No evidence of mitral stenosis.  4. The aortic valve was not well visualized. Aortic valve regurgitation  is not visualized. No aortic stenosis is present.  5. The inferior vena cava is normal in size with greater than 50%  respiratory variability, suggesting right atrial pressure of 3 mmHg.  6. Challenging image quality. __________  2D echo 08/2019: 1. Left ventricular ejection fraction, by visual estimation, is 25 to  30%. The left ventricle has moderate to severely decreased function. There  is no left ventricular hypertrophy.  2. Left ventricular diastolic parameters are consistent with Grade I  diastolic dysfunction (impaired relaxation).  3. The left ventricle demonstrates global hypokinesis.  4. Global right ventricle has normal systolic function.The right  ventricular size is normal. No increase in right ventricular wall  thickness.  5. Left atrial size was normal.  6. TR signal is inadequate for assessing pulmonary artery systolic  Pressure. __________  [facility] 08/2019: Coronary angiography:  Coronary dominance: Right  Left mainstem: Large vessel that bifurcates into the LAD and left circumflex, no significant disease noted  Left anterior descending (LAD): Large vessel that extends to the apical region, diagonal branch 2 of moderate size, mild proximal LAD disease estimated at 30%  Left circumflex (LCx): Large vessel with OM branch 2, no significant disease noted  Right coronary artery (RCA): Right dominant vessel with PL and PDA, 70% proximal RCA disease followed by sequential 50% lesion proximal to mid  vessel  Left ventriculography:  Left ventricular systolic function is normal, LVEF is estimated at 50 to 55%, there is no significant mitral regurgitation , no significant aortic valve stenosis  Right heart pressures RA 10 RV 29/8, 11 PA 27/16 mean 20 Wedge 12 Cardiac output 5.04 Cardiac index 2.51  Final Conclusions:  Single-vessel disease RCA Discussed with interventional cardiology, they will perform FFR to determine if intervention needed Right heart pressures are normal   Recommendations:  We will defer coronary intervention to interventional cardiology Needs weight loss, management of COPD Management of hyperlipidemia __________  iFR 08/2019:  Conclusions: 1. Single vessel coronary with sequential 70% and 50% proximal and mid RCA stenoses that are not hemodynamically significant (iFR = 0.97). See Dr. Donivan Scull diagnostic catheterization report for further details of right/left heart catheterization.  Recommendations: 1. Medical therapy and risk factor modification to prevent progression of disease.   EKG:  EKG is ordered today.  The EKG ordered today demonstrates NSR, 97 bpm, LBBB (known)  Recent Labs: 10/17/2020: TSH 2.440 01/23/2021: ALT 116 01/24/2021: BNP 67.0 01/27/2021: Magnesium 2.0 03/08/2021: BUN 18; Creatinine, Ser 0.82; Potassium 4.6; Sodium 141 03/13/2021: Hemoglobin 14.4; Platelets 327  Recent Lipid Panel    Component Value Date/Time   CHOL 79 (L) 01/23/2021 1511   TRIG 215 (H) 01/23/2021 1511   HDL 31 (L) 01/23/2021 1511   CHOLHDL 1.5 11/30/2019 1135   CHOLHDL 1.7 01/01/2018 0000   VLDL 29 08/06/2016 0824   LDLCALC 15 01/23/2021 1511   LDLCALC 29 01/01/2018 0000    PHYSICAL EXAM:    VS:  BP 112/70 (BP Location: Left Arm, Patient Position: Sitting, Cuff Size: Large)   Pulse 97   Ht _0  (1.6 m)   Wt 239 lb (108.4 kg)   SpO2 96%   BMI 42.34 kg/m   BMI: Body mass index is 42.34 kg/m.  Physical Exam Vitals reviewed.  Constitutional:       Appearance: She is well-developed.  HENT:     Head: Normocephalic and atraumatic.  Eyes:     General:        Right eye: No discharge.        Left eye: No discharge.  Neck:     Comments: JVD is difficult to assess secondary to body habitus Cardiovascular:     Rate and Rhythm: Normal rate and regular rhythm.     Pulses: No midsystolic click and no opening snap.          Posterior tibial pulses are 2+ on the right side and 2+ on the left side.     Heart sounds: Normal heart sounds, S1 normal and S2 normal. Heart sounds not distant. No murmur heard. No friction rub.  Pulmonary:     Effort: Pulmonary effort is normal. No respiratory distress.     Breath sounds: Normal breath sounds. No decreased breath sounds, wheezing or rales.  Chest:     Chest wall: No tenderness.  Abdominal:     General: There is no distension.     Palpations: Abdomen is soft.     Tenderness: There is no abdominal tenderness.  Musculoskeletal:     Cervical back: Normal range of motion.  Skin:    General: Skin is warm and dry.     Nails: There is no clubbing.  Neurological:     Mental Status: She is alert and oriented to person, place, and time.  Psychiatric:        Speech: Speech normal.        Behavior: Behavior  normal.        Thought Content: Thought content normal.        Judgment: Judgment normal.     Wt Readings from Last 3 Encounters:  03/29/21 239 lb (108.4 kg)  03/21/21 242 lb (109.8 kg)  03/13/21 239 lb (108.4 kg)     ASSESSMENT & PLAN:   1. HFrEF secondary to NICM with underlying LBBB: She has NYHA class III symptoms.  Volume status is difficult to assess on physical exam secondary to body habitus.  Recent RHC demonstrated mildly elevated left heart filling pressure with severely elevated right heart filling pressure, mildly elevated PASP with increased pulmonary vascular resistance and reduced cardiac output/index with findings suggestive of right heart failure in this setting she has been  referred to the advanced heart failure service.  Physician to physician communication occurred to expedite evaluation.  She remains on torsemide 20 mg daily and historically has had worsening renal function with daily scheduled use.  It was discussed with her there can be some degree of renal dysfunction when diuresing RV failure.  CHF education was again discussed in detail.  She will otherwise continue Toprol-XL, Farxiga, and spironolactone.  She has been intolerant to Entresto secondary to cough and ARB has previously been held in the setting of AKI and associated hypotension.  She should continue to follow-up with PCP as directed following the evaluation of with the advanced heart failure clinic for further considerations of CRT in the context of her persistent cardiomyopathy with underlying LBBB.  2. Nonobstructive CAD: No symptoms of chest pain.  Prior moderate RCA disease with normal iFR in 08/2019 with subsequent coronary CTA in 06/2020 showing nonobstructive disease.  Continue risk factor modification and medical therapy including aspirin, atorvastatin, Toprol-XL, and Imdur.  No indication for further ischemic testing at this time.  3. PVCs: Quiescent.  Continue Toprol-XL as outlined above.  4. HLD: LDL of 15 in 01/2021.  She remains on atorvastatin with history of fatty liver disease.  5. COPD/restrictive lung disease: Followed by pulmonology.  6. Sleep apnea: She does have a history of underlying sleep apnea with prior CPAP usage, however she no longer has her CPAP machine.  We will refer her to sleep medicine for further treatment.  7. Morbid obesity with deconditioning: Underlying obesity with physical deconditioning continue to contribute to her overall presentation dyspnea.  Weight loss is advised through heart healthy diet and regular exercise.  8. Diabetes: Poorly controlled.  Follow-up with PCP as directed.   Disposition: F/u with Dr. Rockey Situ or an APP in 1 month, and EP as  directed.   Medication Adjustments/Labs and Tests Ordered: Current medicines are reviewed at length with the patient today.  Concerns regarding medicines are outlined above. Medication changes, Labs and Tests ordered today are summarized above and listed in the Patient Instructions accessible in Encounters.   Signed, Christell Faith, PA-C 03/29/2021 4:47 PM     [address], [place] [address] [phone]

## 2021-03-29 ENCOUNTER — Other Ambulatory Visit: Payer: Self-pay

## 2021-03-29 ENCOUNTER — Encounter: Payer: Self-pay | Admitting: Physician Assistant

## 2021-03-29 ENCOUNTER — Ambulatory Visit (INDEPENDENT_AMBULATORY_CARE_PROVIDER_SITE_OTHER): Payer: Medicare Other | Admitting: Physician Assistant

## 2021-03-29 ENCOUNTER — Ambulatory Visit: Payer: Medicare Other | Admitting: Cardiology

## 2021-03-29 VITALS — BP 112/70 | HR 97 | Ht 63.0 in | Wt 239.0 lb

## 2021-03-29 DIAGNOSIS — I447 Left bundle-branch block, unspecified: Secondary | ICD-10-CM | POA: Diagnosis not present

## 2021-03-29 DIAGNOSIS — Z79899 Other long term (current) drug therapy: Secondary | ICD-10-CM | POA: Diagnosis not present

## 2021-03-29 DIAGNOSIS — J984 Other disorders of lung: Secondary | ICD-10-CM

## 2021-03-29 DIAGNOSIS — I493 Ventricular premature depolarization: Secondary | ICD-10-CM

## 2021-03-29 DIAGNOSIS — I428 Other cardiomyopathies: Secondary | ICD-10-CM | POA: Diagnosis not present

## 2021-03-29 DIAGNOSIS — E785 Hyperlipidemia, unspecified: Secondary | ICD-10-CM

## 2021-03-29 DIAGNOSIS — G4733 Obstructive sleep apnea (adult) (pediatric): Secondary | ICD-10-CM | POA: Diagnosis not present

## 2021-03-29 DIAGNOSIS — I272 Pulmonary hypertension, unspecified: Secondary | ICD-10-CM | POA: Diagnosis not present

## 2021-03-29 DIAGNOSIS — E1142 Type 2 diabetes mellitus with diabetic polyneuropathy: Secondary | ICD-10-CM | POA: Diagnosis not present

## 2021-03-29 DIAGNOSIS — I2510 Atherosclerotic heart disease of native coronary artery without angina pectoris: Secondary | ICD-10-CM | POA: Diagnosis not present

## 2021-03-29 DIAGNOSIS — I5020 Unspecified systolic (congestive) heart failure: Secondary | ICD-10-CM | POA: Diagnosis not present

## 2021-03-29 NOTE — Patient Instructions (Signed)
Medication Instructions:  Please continue your current medication  *If you need a refill on your cardiac medications before your next appointment, please call your pharmacy*   Lab Work: BMET  LABS WILL APPEAR ON [facility]  Testing/Procedures: None   Follow-Up: Your next appointment:   1 month(s)  The format for your next appointment:   In Person  Provider:   Christell Faith, PA-C   Other Instructions We placed another referral to the heart failure clinic, they will reach out to you vis phone to schedule first appointment.

## 2021-03-30 LAB — BASIC METABOLIC PANEL
BUN/Creatinine Ratio: 15 (ref 12–28)
BUN: 12 mg/dL (ref 8–27)
CO2: 21 mmol/L (ref 20–29)
Calcium: 8.7 mg/dL (ref 8.7–10.3)
Chloride: 101 mmol/L (ref 96–106)
Creatinine, Ser: 0.79 mg/dL (ref 0.57–1.00)
Glucose: 171 mg/dL — ABNORMAL HIGH (ref 65–99)
Potassium: 4.4 mmol/L (ref 3.5–5.2)
Sodium: 141 mmol/L (ref 134–144)
eGFR: 81 mL/min/{1.73_m2} (ref >59)

## 2021-03-30 NOTE — Addendum Note (Signed)
Addended by: Wynema Birch on: 03/30/2021 12:52 PM   Modules accepted: Orders

## 2021-03-31 ENCOUNTER — Telehealth: Payer: Self-pay | Admitting: *Deleted

## 2021-03-31 DIAGNOSIS — I5020 Unspecified systolic (congestive) heart failure: Secondary | ICD-10-CM

## 2021-03-31 DIAGNOSIS — I2510 Atherosclerotic heart disease of native coronary artery without angina pectoris: Secondary | ICD-10-CM

## 2021-03-31 DIAGNOSIS — I272 Pulmonary hypertension, unspecified: Secondary | ICD-10-CM

## 2021-03-31 DIAGNOSIS — I428 Other cardiomyopathies: Secondary | ICD-10-CM

## 2021-03-31 DIAGNOSIS — Z79899 Other long term (current) drug therapy: Secondary | ICD-10-CM

## 2021-03-31 MED ORDER — TORSEMIDE 20 MG PO TABS: 20.0000 mg | ORAL_TABLET | ORAL | 0 refills | Status: DC

## 2021-03-31 NOTE — Telephone Encounter (Signed)
Reviewed results and recommendations with patient. She verbalized understanding and requested My Chart message as well with this information. She had no further questions at this time.

## 2021-03-31 NOTE — Telephone Encounter (Signed)
-----   Message from Rise Mu, PA-C sent at 03/30/2021  3:18 PM EDT ----- Random glucose is elevated Renal function is normal Potassium at goal  Recommendations: -Follow up with PCP for ongoing diabetic care -Given continued dyspnea noted at her visit with RHC showing elevated right heart pressure, increase torsemide to 40 mg alternating with 20 mg every other day -Follow up BMET 1 week

## 2021-04-13 ENCOUNTER — Telehealth: Payer: Self-pay | Admitting: Pharmacist

## 2021-04-13 NOTE — Chronic Care Management (AMB) (Signed)
Chronic Care Management Pharmacy Assistant   Name: Kim Buchanan  MRN: [id number] DOB: 1950-12-23   Reason for Encounter: Disease State General Adherence    Recent office visits:  03/08/21-Karen Mathis Dad, NP (PCP) General follow up. Restart Farxiga at 10 MG daily. Labs ordered. Follow up in 3 months. 01/23/21-Jolene Cannady, NP. General follow up. Restart Farxiga at 10 MG daily and increase Lasix with extra 20 MG daily x 3 days (total 60 MG daily x 3 days). Labs ordered. Follow up in 6 weeks. Recent consult visits:  03/29/21-Ryan Purcell Mouton (Cardiology) Cardiac follow up. Follow up with Dr. Rockey Situ or an APP in 1 month. 03/13/21-yan Purcell Mouton (Cardiology) Cardiac follow up. Follow up in 1-2 weeks. 02/10/21-Ryan Purcell Mouton (Cardiology) Cardiac 2 week follow up. Add metolazone 2.5 mg daily for 2 days along with added KCl and have her take torsemide 20 mg daily for the next 2 days while on metolazone. Follow up in 1 month. 01/27/21-Ryan Purcell Mouton (Cardiology) Cardiac follow up. Add back losartan and will also replete potassium as indicated. Follow up in 2 weeks. Hospital visits:  None in previous 6 months  Medications: Outpatient Encounter Medications as of 04/13/2021  Medication Sig Note   aspirin 81 MG tablet Take 81 mg by mouth daily.     atorvastatin (LIPITOR) 10 MG tablet Take 1 tablet (10 mg total) by mouth at bedtime.    blood glucose meter kit and supplies Dispense based on patient and insurance preference. Check fasting blood sugar once daily. (FOR ICD E11.69).    dapagliflozin propanediol (FARXIGA) 10 MG TABS tablet Take 1 tablet (10 mg total) by mouth daily before breakfast.    Dulaglutide (TRULICITY) 8.31 DV/7.6HY SOPN Inject 0.75 mg into the skin once a week. 03/15/2021: Tuesday    gabapentin (NEURONTIN) 300 MG capsule Take 1 capsule (300 mg total) by mouth 3 (three) times daily.    isosorbide mononitrate (IMDUR) 30 MG 24 hr tablet TAKE 1 TABLET BY MOUTH  DAILY     Lancets (ONETOUCH DELICA PLUS [id number]) MISC 1 Units by Other route daily.    metFORMIN (GLUCOPHAGE) 1000 MG tablet Take 1 tablet (1,000 mg total) by mouth 2 (two) times daily with a meal.    metoprolol succinate (TOPROL-XL) 25 MG 24 hr tablet Take 1 tablet (25 mg total) by mouth daily.    ONETOUCH ULTRA test strip 1 each by Other route daily.    pantoprazole (PROTONIX) 40 MG tablet Take 1 tablet (40 mg total) by mouth daily.    polyethylene glycol powder (GLYCOLAX/MIRALAX) 17 GM/SCOOP powder Take 17 g by mouth daily.    potassium chloride (KLOR-CON) 10 MEQ tablet Take 1 tablet (10 mEq total) by mouth as directed. Take with your Torsemide.    QUEtiapine (SEROQUEL) 100 MG tablet TAKE 1 TABLET BY MOUTH AT  BEDTIME    spironolactone (ALDACTONE) 25 MG tablet Take 12.5 mg by mouth daily.    torsemide (DEMADEX) 20 MG tablet Take 1-2 tablets (20-40 mg total) by mouth every other day. Torsemide 40 mg alternating with 20 mg every other day. Take Potassium with Torsemide.    No facility-administered encounter medications on file as of 04/13/2021.   Have you had any problems recently with your health? Patient states her sugar is still up in the 200's even after her dose of her Trulicity was increased.  Have you had any problems with your pharmacy? Patient states she has no problems with her pharmacy.  What issues or side effects  are you having with your medications? Patient states she has no issues or side effects with her medications.  What would you like me to pass along to [facility] for them to help you with?  Patient states her sugar is still up in the 200's even after her dose of her Trulicity was increased..  What can we do to take care of you better? Patient states there is nothing at this time.  Star Rating Drugs: Trulicity 6.19 UV/2.2 mL Last filled:03/13/21 28 Farxiga 10 mg Last filled:01/23/21 30 DS Metformin 1000 mg Last filled:04/06/21 90 DS Atorvastatin 10 mg Last  filled:03/22/21 90 DS   [name], [place]

## 2021-04-21 ENCOUNTER — Other Ambulatory Visit: Payer: Self-pay | Admitting: Nurse Practitioner

## 2021-04-21 DIAGNOSIS — I25118 Atherosclerotic heart disease of native coronary artery with other forms of angina pectoris: Secondary | ICD-10-CM

## 2021-04-30 NOTE — Progress Notes (Deleted)
Cardiology Office Note    Date:  04/30/2021   ID:  ANJOLIE MAJER, DOB [birth year], MRN [id number]  PCP:  [name], NP  Cardiologist:  Ida Rogue, MD  Electrophysiologist:  [name], MD   Chief Complaint: Follow-up  History of Present Illness:   Kim Buchanan is a 70 y.o. female with history of nonobstructive CAD, HFrEF secondary to NICM, COPD secondary to tobacco use quitting in 11/2018, restrictive lung disease, DM2, HLD, chronic dyspnea, obesity, untreated sleep apnea, GERD, anxiety, and depression who presents for follow-up of ***.   She was previously followed by Dr. Humphrey Rolls and underwent echo in 03/2019 which showed normal LV systolic function.  Stress testing was undertaken in the setting of ongoing dyspnea which showed anterior ischemia.  Following this, there was recommendation for coronary CTA though the patient did not follow-up and was subsequently evaluated by pulmonology.  In this setting, she was referred to Dr. Rockey Situ who saw the patient in 07/2019 for follow-up of the above prior cardiac testing.  Records were not available at that time, therefore, she underwent repeat echo in 08/2019, which revealed an EF of 25 to 30%, global hypokinesis, and grade 1 diastolic dysfunction.  There was no significant valvular disease.  Given her new cardiomyopathy, she underwent diagnostic cardiac cath in 08/2019 which revealed a 70% stenosis in the proximal RCA.  iFR was normal at 0.97.  Medical therapy was recommended.  Of note, LV gram was performed and showed an EF of 50 to 55%.  Right heart cath showed normal filling pressures.  She was seen in the office in 09/2019 and was doing well from a cardiac perspective.  She was noted to have resolution of cough following discontinuation of losartan.  She was started on low-dose carvedilol and referred to cardiopulmonary rehab.     She was seen in the office on 05/19/2020, and noted stable symptoms over the past couple of years.   She followed up with pulmonology with lung disease felt to be less likely the etiology of her symptoms, with mild restrictive physiology noted on PFTs.  She was unable to participate in cardiopulmonary rehab secondary to dyspnea. It was noted she did not tolerate Coreg secondary to cough and self discontinued it.  She underwent echo, per pulmonology on 05/30/2020, which showed an EF of 30-35%, Gr2DD, normal RV cavity size, and mild MR.  She was scheduled for coronary CTA on 06/09/2020, which was unable to be performed secondary to heart rates.  She was able to undergo calcium scoring with a calcium score of 2.67 which placed the patient in the 49th percentile for age and sex matched control.  She was seen in follow up on 06/10/2020 and continued to feel about the same with ongoing exertional dyspnea.  She subsequently underwent coronary CTA on 06/30/2020 which showed nonobstructive CAD as outlined below.  In this setting, she was started on losartan to further optimize her medical therapy.  She was seen by pulmonology on 07/12/2020, in follow up with noted minimal obstructive, if any, and mild restrictive lung disease.  She was seen in the office on 07/20/2020 and continued to feel about the same with stable exertional dyspnea that has been present for the past several years.  She did not notice any changes in her dyspnea following the addition of inhaler therapy by pulmonology.  With continued PVCs, Toprol-XL was titrated to 37.5 mg daily.  She was also started on spironolactone 12.5 mg.  She was seen  in the office on 08/22/2020 noting a 3-day history of increased chest congestion, shortness of breath, and nonproductive cough.  Her weight was stable.  Lasix 20 mg daily for 5 days was added.  I last saw her on 09/07/2020 at which time she noted an improvement in her dyspnea while on scheduled low-dose Lasix 20 mg daily with a return in her dyspnea and exertional fatigue while being off this medication.  In this setting, she  was placed on scheduled low-dose Lasix 20 mg daily.   She was seen by Dr. Quentin Ore on 10/05/2020 for further evaluation of her NICM and left bundle branch block.  She was placed on Entresto with recommendation to follow-up echo in approximately 3 months.  If her cardiomyopathy persisted CRT-D was recommended.  She was also referred to the advanced heart failure clinic in [place].   She was seen in 10/2020 for follow-up and it was noted she did not tolerate Entresto secondary to cough and self discontinued this medication.  She had concerns about the affordability of [name], though with patient assistance paperwork this was subsequently approved.  She was volume up and was advised to increase Lasix to 40 mg daily for 3 days followed by 20 mg daily thereafter.  She was placed back on losartan.   She was seen by EP in 12/2020 with continued volume overload and shortness of breath.  Echo in 12/2020 showed a persistent cardiomyopathy with an EF of 30 to 35%, global hypokinesis, indeterminate LV diastolic function parameters, normal RV systolic function and ventricular cavity size, and mild mitral valve regurgitation.  Her weight was up 4 pounds when compared to her visit in 10/2020.  Her Lasix was increased to 40 mg daily and losartan was titrated to 25 mg daily.   She was seen in the office on 01/24/2021 by Marrianne Mood, PA-C with a weight that was down 1 pound when compared to her visit in 12/2020.  She continued to note shortness of breath and reported having recently been seen by her PCP with chest x-ray without evidence of pleural effusions or vascular congestion.  She was advised at PCP's office to increase Lasix to 60 mg briefly with noted slight improvement in symptoms after 1 dose.  At her visit with cardiology, she indicated she had not taken her Lasix for several days and was taking this on a as needed basis rather than the recommended 40 mg daily.  BP was soft at 100/76.  Her losartan was held on 4/5  secondary to soft BP, and she was placed on torsemide 20 mg daily in place of Lasix 40 mg daily.     I saw her in follow up on 01/27/2021, at which time she reported feeling slightly better.  Her weight was down 3 pounds from 4/5 visit.  She was adherent to torsemide 20 mg daily.  She did note a slight improvement in symptoms.  She had also cut back on her fluid consumption.  Follow up labs obtained at that time showed an up-trending BUN/SCr.  In this setting, she was advised to hold torsemide x 1 day then resume at 20 mg every other day.     I saw her on 02/10/2021, at which time she felt about the same as she has over her previous visits.  She was adherent to torsemide 20 mg every other day.  She had increased her fluid intake.  Her weight was up 1 pound when compared to her visit on 4/8.  She did  continue to appear mildly volume up.  Metolazone 2.5 mg daily for 2 days was added along with extra KCl to take with her torsemide.   She was seen on 03/13/2021 with unchanged shortness of breath.  She did not note significant urine output following addition of metolazone.  Her weight was down 3 pounds when compared to her last visit.  Given persistent symptoms, she underwent RHC on 03/21/2021 which showed mildly elevated left heart filling pressure, severely elevated right heart filling pressure, mildly elevated PASP with increased pulmonary vascular resistance, and severely reduced cardiac output/index.  Findings were suggestive of a component of right heart failure.  Torsemide was increased to 20 mg daily.  Toprol-XL was decreased to 25 mg daily.  She was referred to the advanced heart failure clinic.  She was last seen on 03/29/2021 with unchanged symptoms.  She did not notice a significant increase in urine output or change in dyspnea on titrated dose of torsemide.  She was drinking greater than 2 L of fluid again.  Her weight was stable.  She was continued on torsemide 20 mg daily given historically noted  worsening renal function with scheduled dosing and escalation.  She is scheduled to see the advanced heart failure clinic on 8/1.  ***     Labs independently reviewed: 03/2021 - BUN 12, serum creatinine 0.79, potassium 4.4 02/2021 - HGB 14.4, PLT 327 01/2021 - TC 79, TG 215, HDL 31, LDL 15, albumin 4.0, AST 76, ALT 116, A1c 8.6 12/2020 - magnesium 2.1 09/2020 - TSH normal    Past Medical History:  Diagnosis Date   Anxiety    Arthritis    lower back   COPD (chronic obstructive pulmonary disease) ([place])    Depression    Diabetes mellitus without complication (HCC)    GERD (gastroesophageal reflux disease)    Hyperlipidemia    Morbid obesity ([place])    NICM (nonischemic cardiomyopathy) ([place])    a. 03/2019 Echo: Nl LVEF; b. 09/04/2019 Echo: EF 25-30%, Gr1 DD, glob HK, nl RV fxn; c. 09/11/2019 Cath: nonobs LAD/RCA dzs. EF 50-55%. RA 10, RV 29/8(11), PA 27/16(20), PCWP 12. CO/CI 5.04/2.51.   Nonobstructive CAD (coronary artery disease)    a. 03/2019 [facility] Humph[facility] Rolls): EF 63%, ant ischemia; b. 08/2019 Cath: LM nl, LAD 53m, RCA 70p (iFR nl @ 0.97), 50p. EF 50-55%-->Med Rx.   Osteoporosis    Panic attack    PTSD (post-traumatic stress disorder)    Rectal fistula    Sleep apnea    a. Prev wore CPAP but lost insurance and hasn't been using since (now has insurance).   Wears dentures    full upper    Past Surgical History:  Procedure Laterality Date   ABDOMINAL HYSTERECTOMY     CARDIAC CATHETERIZATION     COLONOSCOPY  2014   COLONOSCOPY WITH PROPOFOL N/A 10/01/2016   Procedure: COLONOSCOPY WITH PROPOFOL;  Surgeon: Lucilla Lame, MD;  Location: [place] [facility];  Service: Endoscopy;  Laterality: N/A;   CORONARY PRESSURE WIRE/FFR WITH 3D MAPPING N/A 09/11/2019   Procedure: Coronary Pressure Wire/FFR w/3D Mapping;  Surgeon: Nelva Bush, MD;  Location: [facility];  Service: Cardiovascular;  Laterality: N/A;   CYSTOCELE REPAIR N/A 12/17/2016   Procedure: ANTERIOR REPAIR (CYSTOCELE);   Surgeon: Brayton Mars, MD;  Location: [facility];  Service: Gynecology;  Laterality: N/A;   ESOPHAGOGASTRODUODENOSCOPY (EGD) WITH PROPOFOL N/A 08/11/2019   Procedure: ESOPHAGOGASTRODUODENOSCOPY (EGD) WITH PROPOFOL;  Surgeon: Lucilla Lame, MD;  Location: [facility];  Service: Endoscopy;  Laterality: N/A;   POLYPECTOMY  10/01/2016   Procedure: POLYPECTOMY;  Surgeon: Lucilla Lame, MD;  Location: [place];  Service: Endoscopy;;   RECTAL SURGERY  06/28/2015   Rectal prolapse, laparoscopic rectopexy the coldCh[place], MD; [facility]   RIGHT HEART CATH N/A 03/21/2021   Procedure: RIGHT HEART CATH;  Surgeon: Nelva Bush, MD;  Location: [facility];  Service: Cardiovascular;  Laterality: N/A;   RIGHT/LEFT HEART CATH AND CORONARY ANGIOGRAPHY Bilateral 09/11/2019   Procedure: RIGHT/LEFT HEART CATH AND CORONARY ANGIOGRAPHY;  Surgeon: [name], MD;  Location: [place] [facility];  Service: Cardiovascular;  Laterality: Bilateral;   TUBAL LIGATION     VAGINAL HYSTERECTOMY Bilateral 12/17/2016   Procedure: HYSTERECTOMY VAGINAL WITH BILATERAL SALPINGO OOPHERECTOMY;  Surgeon: Brayton Mars, MD;  Location: [facility];  Service: Gynecology;  Laterality: Bilateral;    Current Medications: No outpatient medications have been marked as taking for the 05/05/21 encounter (Appointment) with Rise Mu, PA-C.    Allergies:   Lisinopril   Social History   Socioeconomic History   Marital status: Married    Spouse name: Paediatric nurse   Number of children: 1   Years of education: Not on file   Highest education level: 9th grade  Occupational History    Employer: DISABLED    Comment: for panic attacks  Tobacco Use   Smoking status: Former    Packs/day: 1.50    Years: 50.00    Pack years: 75.00    Types: Cigarettes    Quit date: 11/29/2018    Years since quitting: 2.4   Smokeless tobacco: Never   Tobacco comments:    started at age 72  Vaping Use   Vaping Use: Never used   Substance and Sexual Activity   Alcohol use: No    Alcohol/week: 0.0 standard drinks   Drug use: No   Sexual activity: Not Currently  Other Topics Concern   Not on file  Social History Narrative   Pt had one son, who died when was 26 YO in a MVA.   Social Determinants of Health   Financial Resource Strain: Low Risk    Difficulty of Paying Living Expenses: Not hard at all  Food Insecurity: No Food Insecurity   Worried About Charity fundraiser in the Last Year: Never true   [place] in the Last Year: Never true  Transportation Needs: No Transportation Needs   Lack of Transportation (Medical): No   Lack of Transportation (Non-Medical): No  Physical Activity: Inactive   Days of Exercise per Week: 0 days   Minutes of Exercise per Session: 0 min  Stress: No Stress Concern Present   Feeling of Stress : Not at all  Social Connections: Not on file     Family History:  The patient's family history includes Cancer in her sister; Diabetes in her brother, brother, mother, sister, and sister; Healthy in her sister; Heart disease in her brother and father; Hypertension in her father; Lymphoma in her brother and brother. There is no history of Breast cancer, Ovarian cancer, or Colon cancer.  ROS:   ROS   EKGs/Labs/Other Studies Reviewed:    Studies reviewed were summarized above. The additional studies were reviewed today:  [place] 03/21/2021: Conclusions: Mildly elevated left heart filling pressure (mean PCWP 20 mmHg). Severely elevated right heart filling pressure (mean RAP 17 mmHg, RVEDP 19 mmHg). Mildly elevated pulmonary artery pressure (mean PAP 28 mmHg) with increased pulmonary vascular resistance (PVR  2.9 WU). Severely reduced Fick cardiac output/index (CO 2.9, CI 1.3).  Low PAPI suggests significant component of right heart failure.   Recommendations: Increase torsemide to 20 mg daily. Decrease metoprolol succinate to 25 mg daily. Refer to advanced heart failure clinic  for expedited evaluation. __________   2D echo 12/20/2020: 1. Left ventricular ejection fraction, by estimation, is 30 to 35%. The  left ventricle has moderately decreased function. The left ventricle  demonstrates global hypokinesis. There is mild left ventricular  hypertrophy. Left ventricular diastolic  parameters are indeterminate.   2. Right ventricular systolic function is normal. The right ventricular  size is normal. Tricuspid regurgitation signal is inadequate for assessing  PA pressure.   3. The mitral valve is normal in structure. Mild mitral valve  regurgitation. No evidence of mitral stenosis. __________   2D echo 09/09/2020: 1. Left ventricular ejection fraction, by estimation, is 30 to 35%. The  left ventricle has moderately decreased function. The left ventricle  demonstrates regional wall motion abnormalities (Anterior and anteroseptal  wall hypokinesis). The average left  ventricular global longitudinal strain is -6.8 %. The global longitudinal  strain is abnormal.   2. Right ventricular systolic function is normal. The right ventricular  size is normal. Tricuspid regurgitation signal is inadequate for assessing  PA pressure.   3. The mitral valve is normal in structure. Mild mitral valve  regurgitation. __________   Coronary CTA 06/30/2020: Aorta:  Normal size.  Mild aortic atherosclerosis.  No dissection.   Aortic Valve:  Trileaflet.  No calcifications.   Coronary Arteries:  Normal coronary origin.  Right dominance.   RCA is a large dominant artery that gives rise to PDA and PLVB. There is mild (25-49%) soft plaque stenosis in the proximal to mid vessel followed by mild (25-49%) mixed plaque stenosis.   Left main is a large artery that gives rise to LAD and LCX arteries.   LAD is a large vessel that gives rise to medium size D1 and D2 and small D3; there is long, soft plaque stenosis in the mid vessel mild (25-49%) followed by moderate (50-69%).   LCX is  a non-dominant artery that gives rise to one large OM1 branch. There is no plaque.   Other findings:   Normal pulmonary vein drainage into the left atrium.   Normal let atrial appendage without a thrombus.   Normal size of the pulmonary artery.   IMPRESSION: 1. Coronary calcium score of 39.3. This was 17 percentile for age and sex matched control.   2. Normal coronary origin with right dominance.   3. Moderate (50-69%) soft plaque stenosis in the mid LAD; mild (25-49%) soft and mixed plaque stenoses in the proximal to mid RCA.   4. Study will be sent for FFR. __________   FFR 07/01/2020: 1. Left Main: findings 0.99, 0.99   2. LAD: findings 0.99, 0.97 0.92   3. OM: Findings 0.98, 0.88 0.84   4. RCA: findings 0.97, 0.81 0.84   IMPRESSION: FFR not suggestive of significant obstructive disease. __________   Coronary CTA 06/09/2020:  Unable to be performed secondary to heart rate -Calcium score 2.67 which places the patient in the 49th percentile for age and sex matched control __________     2D echo 05/30/2020: 1. Left ventricular ejection fraction, by estimation, is 30 to 35%. The  left ventricle has moderately decreased function. Left ventricular  endocardial border not optimally defined to evaluate regional wall motion.  Left ventricular diastolic parameters  are consistent with  Grade II diastolic dysfunction (pseudonormalization).   2. Right ventricular systolic function was not well visualized. The right  ventricular size is normal. Tricuspid regurgitation signal is inadequate  for assessing PA pressure.   3. The mitral valve is normal in structure. Mild mitral valve  regurgitation. No evidence of mitral stenosis.   4. The aortic valve was not well visualized. Aortic valve regurgitation  is not visualized. No aortic stenosis is present.   5. The inferior vena cava is normal in size with greater than 50%  respiratory variability, suggesting right atrial pressure of  3 mmHg.   6. Challenging image quality. __________   2D echo 08/2019: 1. Left ventricular ejection fraction, by visual estimation, is 25 to  30%. The left ventricle has moderate to severely decreased function. There  is no left ventricular hypertrophy.   2. Left ventricular diastolic parameters are consistent with Grade I  diastolic dysfunction (impaired relaxation).   3. The left ventricle demonstrates global hypokinesis.   4. Global right ventricle has normal systolic function.The right  ventricular size is normal. No increase in right ventricular wall  thickness.   5. Left atrial size was normal.   6. TR signal is inadequate for assessing pulmonary artery systolic  Pressure. __________   [facility] 08/2019: Coronary angiography:  Coronary dominance: Right  Left mainstem:   Large vessel that bifurcates into the LAD and left circumflex, no significant disease noted  Left anterior descending (LAD):   Large vessel that extends to the apical region, diagonal branch 2 of moderate size, mild proximal LAD disease estimated at 30%  Left circumflex (LCx):  Large vessel with OM branch 2, no significant disease noted  Right coronary artery (RCA):  Right dominant vessel with PL and PDA, 70% proximal RCA disease followed by sequential 50% lesion proximal to mid vessel  Left ventriculography: Left ventricular systolic function is normal, LVEF is estimated at 50 to 55%, there is no significant mitral regurgitation , no significant aortic valve stenosis  Right heart pressures RA 10 RV 29/8, 11 PA 27/16 mean 20 Wedge 12 Cardiac output 5.04 Cardiac index 2.51  Final Conclusions:   Single-vessel disease RCA Discussed with interventional cardiology, they will perform FFR to determine if intervention needed Right heart pressures are normal   Recommendations:  We will defer coronary intervention to interventional cardiology Needs weight loss, management of COPD Management of  hyperlipidemia __________   iFR 08/2019:   Conclusions: Single vessel coronary with sequential 70% and 50% proximal and mid RCA stenoses that are not hemodynamically significant (iFR = 0.97).  See Dr. Donivan Scull diagnostic catheterization report for further details of right/left heart catheterization.   Recommendations: Medical therapy and risk factor modification to prevent progression of disease.   EKG:  EKG is ordered today.  The EKG ordered today demonstrates ***  Recent Labs: 10/17/2020: TSH 2.440 01/23/2021: ALT 116 01/24/2021: BNP 67.0 01/27/2021: Magnesium 2.0 03/13/2021: Hemoglobin 14.4; Platelets 327 03/29/2021: BUN 12; Creatinine, Ser 0.79; Potassium 4.4; Sodium 141  Recent Lipid Panel    Component Value Date/Time   CHOL 79 (L) 01/23/2021 1511   TRIG 215 (H) 01/23/2021 1511   HDL 31 (L) 01/23/2021 1511   CHOLHDL 1.5 11/30/2019 1135   CHOLHDL 1.7 01/01/2018 0000   VLDL 29 08/06/2016 0824   LDLCALC 15 01/23/2021 1511   LDLCALC 29 01/01/2018 0000    PHYSICAL EXAM:    VS:  There were no vitals taken for this visit.  BMI: There is no height or weight on  file to calculate BMI.  Physical Exam  Wt Readings from Last 3 Encounters:  03/29/21 239 lb (108.4 kg)  03/21/21 242 lb (109.8 kg)  03/13/21 239 lb (108.4 kg)     ASSESSMENT & PLAN:   HFrEF secondary to NICM with underlying LBBB:  Nonobstructive CAD:  PVCs:  HLD: LDL of 15 in 01/2021.  COPD/restrictive lung disease:  Sleep apnea:  Morbid obesity with deconditioning:  Diabetes:  Disposition: F/u with Dr. Rockey Situ or an APP in ***, and EP as directed.   Medication Adjustments/Labs and Tests Ordered: Current medicines are reviewed at length with the patient today.  Concerns regarding medicines are outlined above. Medication changes, Labs and Tests ordered today are summarized above and listed in the Patient Instructions accessible in Encounters.   Signed, Christell Faith, PA-C 04/30/2021 10:08 AM     [facility] [address] [place], [place] [address] [phone]

## 2021-05-05 ENCOUNTER — Ambulatory Visit: Payer: Medicare Other | Admitting: Physician Assistant

## 2021-05-22 ENCOUNTER — Other Ambulatory Visit: Payer: Self-pay

## 2021-05-22 ENCOUNTER — Encounter (HOSPITAL_COMMUNITY): Payer: Self-pay | Admitting: Cardiology

## 2021-05-22 ENCOUNTER — Other Ambulatory Visit (HOSPITAL_COMMUNITY): Payer: Self-pay | Admitting: Cardiology

## 2021-05-22 ENCOUNTER — Ambulatory Visit (HOSPITAL_COMMUNITY)
Admission: RE | Admit: 2021-05-22 | Discharge: 2021-05-22 | Disposition: A | Discharge status: Home / Self Care | Payer: Medicare Other | Source: Ambulatory Visit | Attending: Cardiology | Admitting: Cardiology

## 2021-05-22 VITALS — BP 128/78 | HR 55 | Wt 234.0 lb

## 2021-05-22 DIAGNOSIS — Z8249 Family history of ischemic heart disease and other diseases of the circulatory system: Secondary | ICD-10-CM | POA: Insufficient documentation

## 2021-05-22 DIAGNOSIS — I2510 Atherosclerotic heart disease of native coronary artery without angina pectoris: Secondary | ICD-10-CM | POA: Insufficient documentation

## 2021-05-22 DIAGNOSIS — Z7982 Long term (current) use of aspirin: Secondary | ICD-10-CM | POA: Insufficient documentation

## 2021-05-22 DIAGNOSIS — E119 Type 2 diabetes mellitus without complications: Secondary | ICD-10-CM | POA: Diagnosis not present

## 2021-05-22 DIAGNOSIS — K219 Gastro-esophageal reflux disease without esophagitis: Secondary | ICD-10-CM | POA: Diagnosis not present

## 2021-05-22 DIAGNOSIS — Z7984 Long term (current) use of oral hypoglycemic drugs: Secondary | ICD-10-CM | POA: Diagnosis not present

## 2021-05-22 DIAGNOSIS — E785 Hyperlipidemia, unspecified: Secondary | ICD-10-CM | POA: Diagnosis not present

## 2021-05-22 DIAGNOSIS — J449 Chronic obstructive pulmonary disease, unspecified: Secondary | ICD-10-CM | POA: Insufficient documentation

## 2021-05-22 DIAGNOSIS — I493 Ventricular premature depolarization: Secondary | ICD-10-CM

## 2021-05-22 DIAGNOSIS — Z79899 Other long term (current) drug therapy: Secondary | ICD-10-CM | POA: Diagnosis not present

## 2021-05-22 DIAGNOSIS — I5022 Chronic systolic (congestive) heart failure: Secondary | ICD-10-CM | POA: Insufficient documentation

## 2021-05-22 DIAGNOSIS — Z87891 Personal history of nicotine dependence: Secondary | ICD-10-CM | POA: Insufficient documentation

## 2021-05-22 DIAGNOSIS — I5020 Unspecified systolic (congestive) heart failure: Secondary | ICD-10-CM | POA: Diagnosis not present

## 2021-05-22 DIAGNOSIS — I428 Other cardiomyopathies: Secondary | ICD-10-CM | POA: Diagnosis not present

## 2021-05-22 LAB — BRAIN NATRIURETIC PEPTIDE: B Natriuretic Peptide: 98.4 pg/mL (ref 0.0–100.0)

## 2021-05-22 LAB — BASIC METABOLIC PANEL
Anion gap: 13 (ref 5–15)
BUN: 15 mg/dL (ref 8–23)
CO2: 23 mmol/L (ref 22–32)
Calcium: 9.5 mg/dL (ref 8.9–10.3)
Chloride: 104 mmol/L (ref 98–111)
Creatinine, Ser: 0.65 mg/dL (ref 0.44–1.00)
GFR, Estimated: >60 mL/min (ref >60)
Glucose, Bld: 139 mg/dL — ABNORMAL HIGH (ref 70–99)
Potassium: 4.2 mmol/L (ref 3.5–5.1)
Sodium: 140 mmol/L (ref 135–145)

## 2021-05-22 LAB — CBC
HCT: 49.2 % — ABNORMAL HIGH (ref 36.0–46.0)
Hemoglobin: 15.1 g/dL — ABNORMAL HIGH (ref 12.0–15.0)
MCH: 26.4 pg (ref 26.0–34.0)
MCHC: 30.7 g/dL (ref 30.0–36.0)
MCV: 86 fL (ref 80.0–100.0)
Platelets: 353 10*3/uL (ref 150–400)
RBC: 5.72 MIL/uL — ABNORMAL HIGH (ref 3.87–5.11)
RDW: 15.7 % — ABNORMAL HIGH (ref 11.5–15.5)
WBC: 8.5 10*3/uL (ref 4.0–10.5)
nRBC: 0 % (ref 0.0–0.2)

## 2021-05-22 MED ORDER — VALSARTAN 40 MG PO TABS: 20.0000 mg | ORAL_TABLET | Freq: Two times a day (BID) | ORAL | 11 refills | Status: DC

## 2021-05-22 MED ORDER — SPIRONOLACTONE 25 MG PO TABS: 12.5000 mg | ORAL_TABLET | Freq: Every day | ORAL | 11 refills | Status: DC

## 2021-05-22 MED ORDER — TORSEMIDE 20 MG PO TABS: 40.0000 mg | ORAL_TABLET | Freq: Every day | ORAL | 11 refills | Status: DC

## 2021-05-22 MED ORDER — DIGOXIN 125 MCG PO TABS: 0.1250 mg | ORAL_TABLET | Freq: Every day | ORAL | 11 refills | Status: DC

## 2021-05-22 NOTE — Progress Notes (Signed)
PCP: [name], NP Cardiology: Dr. Rockey Situ HF Cardiology: Dr. Aundra Dubin  70 y.o. with history of chronic systolic CHF/nonischemic cardiomyopathy, type 2 diabetes, and prior smoking was referred by Dr. Quentin Ore for evaluation of CHF.  She has been noted to have a cardiomyopathy since 2020.  Echo in 11/20 showed EF 25-30%.  LHC in 11/20 and coronary CTA in 9/21 have shown nonobstructive CAD.  She has been evaluated by pulmonary, mild restrictive lung disease and mild COPD.  She quit smoking in 2020, she does not drink ETOH.   Most recent echo in 3/22 showed EF 30-35%, with normal RV.   [place] in 5/22 was worrisome, showing right > left heart failure and low cardiac output with low PAPI.   She is short of breath walking 50-75 feet.  No chest pain.  She notes orthopnea.  Occasional lower extremity edema but minimal on current torsemide dose.  She had a bothersome cough with Entresto and had to stop it.  She also had a cough with Coreg but tolerates Toprol XL.  Rarely, she notes lightheadedness with standing.  She has significant daytime sleepiness and fatigue.   ECG (personally reviewed): NSR, frequent PVCs, iLBBB  REDS clip 28%  Labs (6/22): K 4.4, creatinine 0.79  PMH: 1. Chronic systolic CHF: Nonischemic cardiomyopathy.   - Echo (6/20): Normal EF - [place] (6/20): Anterior ischemia.  - Echo (11/20): EF 25-30% - LHC (11/20): 70% pRCA stenosis, FFR negative.  - Echo (8/21): EF 30-35% - Coronary CTA (9/21): Mild RCA disease, 40-59% mLAD stenosis but CT FFR negative.  - Echo (3/22): EF 30-35% with normal RV - RHC (5/22): mean RA 17, mean PA 28, mean PCWP 20, CI 1.3, PAPI < 1.  2. COPD: Mild.  Quit smoking in 2020.  3. Restrictive lung disease: Mild restriction on PFTs.  4. Type 2 diabetes 5. Coronary artery disease: Nonobstructive.  6. Hyperlipidemia 7. OSA 8. GERD 9. Depression  FH: Brother with "heart surgery," ?CHF.  SH: Married, no ETOH, quit smoking in 2020, retired, lives in  [place]: All systems reviewed and negative except as per HPI.   Current Outpatient Medications  Medication Sig Dispense Refill   aspirin 81 MG tablet Take 81 mg by mouth daily.      atorvastatin (LIPITOR) 10 MG tablet Take 1 tablet (10 mg total) by mouth at bedtime. 90 tablet 3   blood glucose meter kit and supplies Dispense based on patient and insurance preference. Check fasting blood sugar once daily. (FOR ICD E11.69). 1 each 0   dapagliflozin propanediol (FARXIGA) 10 MG TABS tablet Take 1 tablet (10 mg total) by mouth daily before breakfast. 30 tablet 2   digoxin (LANOXIN) 0.125 MG tablet Take 1 tablet (0.125 mg total) by mouth daily. 30 tablet 11   Dulaglutide (TRULICITY) 6.29 BM/8.4XL SOPN Inject 0.75 mg into the skin once a week. 6 mL 1   gabapentin (NEURONTIN) 300 MG capsule Take 1 capsule (300 mg total) by mouth 3 (three) times daily. 90 capsule 0   Lancets (ONETOUCH DELICA PLUS [id number]) MISC 1 Units by Other route daily. 100 each 1   metFORMIN (GLUCOPHAGE) 1000 MG tablet Take 1 tablet (1,000 mg total) by mouth 2 (two) times daily with a meal. 180 tablet 4   metoprolol succinate (TOPROL-XL) 25 MG 24 hr tablet Take 1 tablet (25 mg total) by mouth daily. 180 tablet 2   ONETOUCH ULTRA test strip 1 each by Other route daily. 100 each 1   pantoprazole (  PROTONIX) 40 MG tablet Take 1 tablet (40 mg total) by mouth daily. 90 tablet 4   polyethylene glycol powder (GLYCOLAX/MIRALAX) 17 GM/SCOOP powder Take 17 g by mouth daily.     potassium chloride (KLOR-CON) 10 MEQ tablet Take 1 tablet (10 mEq total) by mouth as directed. Take with your Torsemide. 90 tablet 3   QUEtiapine (SEROQUEL) 100 MG tablet TAKE 1 TABLET BY MOUTH AT  BEDTIME 90 tablet 4   valsartan (DIOVAN) 40 MG tablet Take 0.5 tablets (20 mg total) by mouth 2 (two) times daily. 30 tablet 11   spironolactone (ALDACTONE) 25 MG tablet Take 0.5 tablets (12.5 mg total) by mouth daily. 30 tablet 11   torsemide (DEMADEX) 20 MG tablet  Take 2 tablets (40 mg total) by mouth daily. Take Potassium with Torsemide. 60 tablet 11   No current facility-administered medications for this encounter.   BP 128/78   Pulse (!) 55   Wt 106.1 kg (234 lb)   SpO2 95%   BMI 41.45 kg/m  General: NAD Neck: JVP 8-9 cm with HJR, no thyromegaly or thyroid nodule.  Lungs: Clear to auscultation bilaterally with normal respiratory effort. CV: Nondisplaced PMI.  Heart regular S1/S2, no S3/S4, no murmur.  Trace ankle edema.  No carotid bruit.  Normal pedal pulses.  Abdomen: Soft, nontender, no hepatosplenomegaly, no distention.  Skin: Intact without lesions or rashes.  Neurologic: Alert and oriented x 3.  Psych: Normal affect. Extremities: No clubbing or cyanosis.  HEENT: Normal.   Assessment/Plan: 1. Chronic systolic CHF: Nonischemic cardiomyopathy.  Most recent echo in 3/22 with EF 30-35%, normal RV.  LHC in 11/20 and coronary CTA in 9/21 showed nonobstructive CAD.  [place] in 5/22 was worrisome with R>L heart failure and low cardiac output as well as low PAPI.  She has NYHA class III symptoms.  She is mildly volume overloaded on exam though REDS clip reading is surprisingly normal.  - I will arrange for cardiac MRI to assess for infiltrative disease and myocarditis.  If EF remains low, will need to consider ICD placement.  Her QRS does not appear wide enough to warrant CRT.  - Frequent PVCs noted on ECG today.  ?PVCs playing a role in cardiomyopathy.  I will arrange for 7 day Zio patch.  - Increase torsemide to 40 mg daily. BMET today and in 10 days.  - She can stop Imdur.  - With low output by RHC in 5/22, start digoxin 0.125 daily.  - Add valsartan 20 mg bid.  She could not tolerate Entresto due to cough.  - Increase spironolactone to 25 mg daily.   - Continue dapagliflozin 10 mg daily.  - Continue Toprol XL 25 mg daily (had a cough with Coreg).  2. CAD: nonobstructive by LHC in 11/20 and coronary CTA in 9/21.  - Continue atorvastatin.  -  Continue ASA 81 daily.  3. PVCs: Frequent PVCs noted on ECG today.  ?Playing role in cardiomyopathy.  - 7 day Zio patch as above.  4. Suspect OSA: I will arrange for home sleep study.   Followup 3 wks with NP/PA.    Loralie Champagne 05/22/2021

## 2021-05-22 NOTE — Patient Instructions (Addendum)
EKG done today.  Labs done today. We will contact you only if your labs are abnormal.  INCREASE Torsemide to '40mg'$  (2 tablets) by mouth daily.  INCREASE Spironolactone to '25mg'$  (1 tablet) by mouth daily.   START Valsartan '20mg'$  (1/2 tablet) by mouth 2 times daily.   STOP taking Imdur  No other medication changes were made. Please continue all current medications as prescribed.  Your provider has recommended that  you wear a Zio Patch for 7 days.  This monitor will record your heart rhythm for our review.  IF you have any symptoms while wearing the monitor please press the button.  If you have any issues with the patch or you notice a red or orange light on it please call the company at [phone].  Once you remove the patch please mail it back to the company as soon as possible so we can get the results.  Your provider has recommended that you have a home sleep study.  We have provided you with the equipment in our office today. Please download the app and follow the instructions. YOUR PIN NUMBER IS: [id number]. Once you have completed the test you just dispose of the equipment, the information is automatically uploaded to [place] via blue-tooth technology. If your test is positive for sleep apnea and you need a home CPAP machine you will be contacted by Dr Theodosia Blender office [facility]) to set this up.  Your physician has requested that you have a cardiac MRI. Cardiac MRI uses a computer to create images of your heart as its beating, producing both still and moving pictures of your heart and major blood vessels. This has to be approved through insurance prior to scheduling, once approved we will contact you to schedule an appointment.   Your physician recommends that you schedule a follow-up appointment in: 10 days for a lab only appointment and in 3 weeks with our APP Clinic here in our office.  If you have any questions or concerns before your next appointment please send [place] a message through  [place] or call our office at [phone].    TO LEAVE A MESSAGE FOR THE NURSE SELECT OPTION 2, PLEASE LEAVE A MESSAGE INCLUDING: YOUR NAME DATE OF BIRTH CALL BACK NUMBER REASON FOR CALL**this is important as we prioritize the call backs  YOU WILL RECEIVE A CALL BACK THE SAME DAY AS LONG AS YOU CALL BEFORE 4:00 PM   Do the following things EVERYDAY: Weigh yourself in the morning before breakfast. Write it down and keep it in a log. Take your medicines as prescribed Eat low salt foods--Limit salt (sodium) to 2000 mg per day.  Stay as active as you can everyday Limit all fluids for the day to less than 2 liters   At the [facility], you and your health needs are our priority. As part of our continuing mission to provide you with exceptional heart care, we have created designated Provider Care Teams. These Care Teams include your primary Cardiologist (physician) and Advanced Practice Providers (APPs- Physician Assistants and Nurse Practitioners) who all work together to provide you with the care you need, when you need it.   You may see any of the following providers on your designated Care Team at your next follow up: Dr Glori Bickers Dr Haynes Kerns, NP Lyda Jester, [place] Audry Riles, PharmD   Please be sure to bring in all your medications bottles to every appointment.

## 2021-05-22 NOTE — Progress Notes (Signed)
ReDS Vest / Clip - 05/22/21 1500       ReDS Vest / Clip   Station Marker B    Ruler Value 36    ReDS Value Range Low volume    ReDS Actual Value 28

## 2021-05-23 ENCOUNTER — Telehealth (HOSPITAL_COMMUNITY): Payer: Self-pay | Admitting: *Deleted

## 2021-05-23 NOTE — Telephone Encounter (Signed)
CMRI pre cert npcr//msg sent to julia piercy to contact pt directly to schedule (JB 05/23/21)

## 2021-05-31 ENCOUNTER — Other Ambulatory Visit
Admission: RE | Admit: 2021-05-31 | Discharge: 2021-05-31 | Disposition: A | Discharge status: Home / Self Care | Payer: Medicare Other | Attending: Cardiology | Admitting: Cardiology

## 2021-05-31 DIAGNOSIS — I5020 Unspecified systolic (congestive) heart failure: Secondary | ICD-10-CM

## 2021-05-31 LAB — BASIC METABOLIC PANEL
Anion gap: 17 — ABNORMAL HIGH (ref 5–15)
BUN: 25 mg/dL — ABNORMAL HIGH (ref 8–23)
CO2: 24 mmol/L (ref 22–32)
Calcium: 9.6 mg/dL (ref 8.9–10.3)
Chloride: 96 mmol/L — ABNORMAL LOW (ref 98–111)
Creatinine, Ser: 0.8 mg/dL (ref 0.44–1.00)
GFR, Estimated: >60 mL/min (ref >60)
Glucose, Bld: 230 mg/dL — ABNORMAL HIGH (ref 70–99)
Potassium: 4.1 mmol/L (ref 3.5–5.1)
Sodium: 137 mmol/L (ref 135–145)

## 2021-06-02 ENCOUNTER — Encounter (INDEPENDENT_AMBULATORY_CARE_PROVIDER_SITE_OTHER): Payer: Medicare Other | Admitting: Cardiology

## 2021-06-02 ENCOUNTER — Telehealth (HOSPITAL_COMMUNITY): Payer: Self-pay | Admitting: Surgery

## 2021-06-02 DIAGNOSIS — G4734 Idiopathic sleep related nonobstructive alveolar hypoventilation: Secondary | ICD-10-CM

## 2021-06-02 NOTE — Telephone Encounter (Signed)
Ms. Candela called back and plans to complete the home sleep study tonite.

## 2021-06-02 NOTE — Telephone Encounter (Signed)
I called patient to remind her to perform the ordered home sleep study.  I left a message to indicate that insurance auth not needed and is fine to proceed.

## 2021-06-03 NOTE — Procedures (Signed)
   Sleep Study Report  Patient Information Study Date: 06/02/21 Patient Name: Kim Buchanan Patient ID: [id number] Birth Date: [birth year] Age: 70 Gender: Female Referring Physician:Dalton Aundra Dubin, MD  TEST DESCRIPTION: Home sleep apnea testing was completed using the WatchPat, a Type 1 device, utilizing peripheral arterial tonometry (PAT), chest movement, actigraphy, pulse oximetry, pulse rate, body position and snore. AHI was calculated with apnea and hypopnea using valid sleep time as the denominator. RDI includes apneas, hypopneas, and RERAs. The data acquired and the scoring of sleep and all associated events were performed in accordance with the recommended standards and specifications as outlined in the AASM Manual for the Scoring of Sleep and Associated Events 2.2.0 (2015).  FINDINGS: 1. No evidence of Obstructive Sleep Apnea with AHI 4.5/hr. 2. No Central Sleep Apnea. 3. Oxygen desaturations as low as 84%. 4. Mild to moderate snoring was present. O2 sats were < 88% for 6 minutes. 5. Total sleep time was 2 hrs and 52 min. 6. 13.8% of total sleep time was spent in REM sleep. 7. Normal sleep onset latency at 20 min. 8. Normal REM sleep onset latency at 84 min. 9. Total awakenings were 11.  DIAGNOSIS: Inadequately study for sleep apnea due to inadequate sleep time data acquisition Nocturnal Hypoxemia  RECOMMENDATIONS: 1. Abnormal study with no significant sleep disordered breathing but patient has nocturnal hypoxemia.  2. Healthy sleep recommendations include: adequate nightly sleep (normal 7-9 hrs/night), avoidance of caffeine after noon and alcohol near bedtime, and maintaining a sleep environment that is cool, dark and quiet.  3. Weight loss for overweight patients is recommended.  4. Snoring recommendations include: weight loss where appropriate, side sleeping, and avoidance of alcohol before bed.  5. Operation of motor vehicle or dangerous equipment must be avoided  when feeling drowsy, excessively sleepy, or mentally fatigued.  6. An ENT consultation which may be useful for specific causes of and possible treatment of bothersome snoring .  7. Weight loss may be of benefit in reducing the severity of snoring.   8.  Recommend in lab PSG given short sleep time.  Signature: Electronically Signed: 06/03/21 Fransico Him, MD; [facility]; [place], American Board of Sleep Medicine Report prepared by: Fransico Him, MD

## 2021-06-05 DIAGNOSIS — I493 Ventricular premature depolarization: Secondary | ICD-10-CM | POA: Diagnosis not present

## 2021-06-13 ENCOUNTER — Other Ambulatory Visit: Payer: Self-pay

## 2021-06-13 ENCOUNTER — Ambulatory Visit (INDEPENDENT_AMBULATORY_CARE_PROVIDER_SITE_OTHER): Payer: Medicare Other | Admitting: Nurse Practitioner

## 2021-06-13 ENCOUNTER — Telehealth: Payer: Self-pay

## 2021-06-13 ENCOUNTER — Encounter: Payer: Self-pay | Admitting: Nurse Practitioner

## 2021-06-13 ENCOUNTER — Telehealth (HOSPITAL_COMMUNITY): Payer: Self-pay

## 2021-06-13 VITALS — BP 161/74 | HR 86 | Temp 97.7°F | Ht 61.8 in | Wt 232.6 lb

## 2021-06-13 DIAGNOSIS — G479 Sleep disorder, unspecified: Secondary | ICD-10-CM | POA: Diagnosis not present

## 2021-06-13 DIAGNOSIS — D649 Anemia, unspecified: Secondary | ICD-10-CM | POA: Diagnosis not present

## 2021-06-13 DIAGNOSIS — I152 Hypertension secondary to endocrine disorders: Secondary | ICD-10-CM | POA: Diagnosis not present

## 2021-06-13 DIAGNOSIS — E1169 Type 2 diabetes mellitus with other specified complication: Secondary | ICD-10-CM

## 2021-06-13 DIAGNOSIS — I25118 Atherosclerotic heart disease of native coronary artery with other forms of angina pectoris: Secondary | ICD-10-CM

## 2021-06-13 DIAGNOSIS — E785 Hyperlipidemia, unspecified: Secondary | ICD-10-CM

## 2021-06-13 DIAGNOSIS — E1142 Type 2 diabetes mellitus with diabetic polyneuropathy: Secondary | ICD-10-CM

## 2021-06-13 DIAGNOSIS — I700 Atherosclerosis of aorta: Secondary | ICD-10-CM | POA: Diagnosis not present

## 2021-06-13 DIAGNOSIS — E1159 Type 2 diabetes mellitus with other circulatory complications: Secondary | ICD-10-CM

## 2021-06-13 DIAGNOSIS — I5020 Unspecified systolic (congestive) heart failure: Secondary | ICD-10-CM | POA: Diagnosis not present

## 2021-06-13 DIAGNOSIS — I428 Other cardiomyopathies: Secondary | ICD-10-CM

## 2021-06-13 MED ORDER — TRULICITY 1.5 MG/0.5ML ~~LOC~~ SOAJ
1.5000 mg | SUBCUTANEOUS | 1 refills | Status: DC
Start: 2021-06-13 — End: 2021-06-13

## 2021-06-13 MED ORDER — QUETIAPINE FUMARATE 200 MG PO TABS: 100.0000 mg | ORAL_TABLET | Freq: Every day | ORAL | 1 refills | Status: DC

## 2021-06-13 MED ORDER — TRULICITY 1.5 MG/0.5ML ~~LOC~~ SOAJ: 1.5000 mg | SUBCUTANEOUS | 1 refills | Status: DC

## 2021-06-13 NOTE — Assessment & Plan Note (Signed)
Chronic.  Controlled.  Continue with current medication regimen of Atorvastatin 10mg daily.  Labs ordered today.  Return to clinic in 6 months for reevaluation.  Call sooner if concerns arise.   

## 2021-06-13 NOTE — Assessment & Plan Note (Signed)
Chronic. Ongoing.  Patient follows up with Heart Failure clinic tomorrow.  Continue per their recommendations.  Continue with ASA and Atorvastatin.

## 2021-06-13 NOTE — Telephone Encounter (Signed)
Called to confirm/remind patient of their appointment at the [facility] on 06/14/21.   Patient reminded to bring all medications and/or complete list.  Confirmed patient has transportation. Gave directions, instructed to utilize [facility] parking.  Confirmed appointment prior to ending call.

## 2021-06-13 NOTE — Assessment & Plan Note (Signed)
Chronic. Ongoing.  Patient follows up with Heart Failure clinic tomorrow.  Continue per their recommendations.

## 2021-06-13 NOTE — Assessment & Plan Note (Signed)
Chronic.  Controlled.  Continue with current medication regimen.  Labs ordered today.  Return to clinic in 3 months for reevaluation.  Call sooner if concerns arise.   

## 2021-06-13 NOTE — Telephone Encounter (Signed)
Chronic Care Management  Outreach Note   Name: Kim Buchanan MRN: [id number] DOB: [birth year]  Referred by: [name], NP Reason for referral: Telephone Appointment with Clinical Pharmacist, Madelin Rear.   Spoke with patient regarding trulicity dose increase to 1.5 mg once weekly. Rx has been sent to [facility] ([email or web address] and should be delivered 1-2 business days.   Madelin Rear, PharmD, CPP [facility]  [phone]

## 2021-06-13 NOTE — Assessment & Plan Note (Signed)
Chronic. Uncontrolled.  Sugars are running 200-300.  Will increase Trulicity to 1.'5mg'$ .  Patient receives this through PAP. Will reach out to pharmacist to make sure the increase dose is sent to patient. Patient instructed to do two injections until new dose arrives.

## 2021-06-13 NOTE — Progress Notes (Signed)
 BP (!) 161/74 (BP Location: Right Arm, Cuff Size: Normal)   Pulse 86   Temp 97.7 F (36.5 C) (Oral)   Ht 5' 1.8" (1.57 m)   Wt 232 lb 9.6 oz (105.5 kg)   SpO2 97%   BMI 42.82 kg/m    Subjective:    Patient ID: Kim Buchanan, female    DOB: 08/19/1951, 70 y.o.   MRN: [id number]  HPI: Kim Buchanan is a 70 y.o. female  Chief Complaint  Patient presents with   Diabetes   Hyperlipidemia   Hypertension   HYPERTENSION / HYPERLIPIDEMIA Satisfied with current treatment? yes Duration of hypertension: years BP monitoring frequency: a few times a month BP range: 120/80 BP medication side effects: no Past BP meds:  metoprolol and valsartan Duration of hyperlipidemia: years Cholesterol medication side effects: no Cholesterol supplements: none Past cholesterol medications: atorvastain (lipitor) Medication compliance: excellent compliance Aspirin: yes Recent stressors: no Recurrent headaches: no Visual changes: no Palpitations: no Dyspnea: SOB Chest pain: no Lower extremity edema: no Dizzy/lightheaded: no  DIABETES Hypoglycemic episodes:no Polydipsia/polyuria: no Visual disturbance: no Chest pain: no Paresthesias: no Glucose Monitoring: no  Accucheck frequency: Not Checking  Fasting glucose:  Post prandial:  Evening:  Before meals: Taking Insulin?: yes  Long acting insulin:  Short acting insulin: Blood Pressure Monitoring: a few times a month Retinal Examination: Up to Date Foot Exam: Up to Date Diabetic Education: Not Completed Pneumovax: Up to Date Influenza: Up to Date Aspirin: yes  HEART FAILURE Patient states she is seeing the cardiologist in [place] tomorrow. This will be her second visit.  States her SOB has improved some. Only becomes SOB when she is walking.  She is having a hard time sleeping and would like to increase her seroquel to help her sleep.   Relevant past medical, surgical, family and social history reviewed and updated as  indicated. Interim medical history since our last visit reviewed. Allergies and medications reviewed and updated.  Review of Systems  Eyes:  Negative for visual disturbance.  Respiratory:  Positive for shortness of breath. Negative for cough and chest tightness.   Cardiovascular:  Negative for chest pain, palpitations and leg swelling.  Neurological:  Negative for dizziness and headaches.  Psychiatric/Behavioral:  Positive for sleep disturbance.    Per HPI unless specifically indicated above     Objective:    BP (!) 161/74 (BP Location: Right Arm, Cuff Size: Normal)   Pulse 86   Temp 97.7 F (36.5 C) (Oral)   Ht 5' 1.8" (1.57 m)   Wt 232 lb 9.6 oz (105.5 kg)   SpO2 97%   BMI 42.82 kg/m   Wt Readings from Last 3 Encounters:  06/13/21 232 lb 9.6 oz (105.5 kg)  05/22/21 234 lb (106.1 kg)  03/29/21 239 lb (108.4 kg)    Physical Exam Vitals and nursing note reviewed.  Constitutional:      General: She is not in acute distress.    Appearance: Normal appearance. She is normal weight. She is not ill-appearing, toxic-appearing or diaphoretic.  HENT:     Head: Normocephalic.     Right Ear: External ear normal.     Left Ear: External ear normal.     Nose: Nose normal.     Mouth/Throat:     Mouth: Mucous membranes are moist.     Pharynx: Oropharynx is clear.  Eyes:     General:        Right eye: No discharge.          Left eye: No discharge.     Extraocular Movements: Extraocular movements intact.     Conjunctiva/sclera: Conjunctivae normal.     Pupils: Pupils are equal, round, and reactive to light.  Cardiovascular:     Rate and Rhythm: Normal rate and regular rhythm.     Heart sounds: No murmur heard. Pulmonary:     Effort: Pulmonary effort is normal. No respiratory distress.     Breath sounds: Normal breath sounds. No wheezing or rales.  Musculoskeletal:     Cervical back: Normal range of motion and neck supple.  Skin:    General: Skin is warm and dry.     Capillary  Refill: Capillary refill takes less than 2 seconds.  Neurological:     General: No focal deficit present.     Mental Status: She is alert and oriented to person, place, and time. Mental status is at baseline.  Psychiatric:        Mood and Affect: Mood normal.        Behavior: Behavior normal.        Thought Content: Thought content normal.        Judgment: Judgment normal.    Results for orders placed or performed during the hospital encounter of 59/16/38  Basic metabolic panel  Result Value Ref Range   Sodium 137 135 - 145 mmol/L   Potassium 4.1 3.5 - 5.1 mmol/L   Chloride 96 (L) 98 - 111 mmol/L   CO2 24 22 - 32 mmol/L   Glucose, Bld 230 (H) 70 - 99 mg/dL   BUN 25 (H) 8 - 23 mg/dL   Creatinine, Ser 0.80 0.44 - 1.00 mg/dL   Calcium 9.6 8.9 - 10.3 mg/dL   GFR, Estimated >60 >60 mL/min   Anion gap 17 (H) 5 - 15      Assessment & Plan:   Problem List Items Addressed This Visit       Cardiovascular and Mediastinum   Hypertension associated with diabetes (HCC)    Chronic. Ongoing.  Patient follows up with Heart Failure clinic tomorrow.  Continue per their recommendations.      Relevant Medications   Dulaglutide (TRULICITY) 1.5 GY/6.5LD SOPN   Other Relevant Orders   Comp Met (CMET)   NICM (nonischemic cardiomyopathy) ([place]) - Primary    Chronic. Ongoing.  Patient follows up with Heart Failure clinic tomorrow.  Continue per their recommendations.      CAD (coronary artery disease), native coronary artery    Chronic. Ongoing.  Patient follows up with Heart Failure clinic tomorrow.  Continue per their recommendations.  Continue with ASA and Atorvastatin.      Aortic atherosclerosis (HCC)    Chronic. Ongoing.  Patient follows up with Heart Failure clinic tomorrow.  Continue per their recommendations.  Continue with ASA and Atorvastatin.      HFrEF (heart failure with reduced ejection fraction) (HCC)    Chronic. Ongoing.  Patient follows up with Heart Failure clinic  tomorrow.  Continue per their recommendations.  Continue with ASA and Atorvastatin.        Endocrine   Hyperlipidemia associated with type 2 diabetes mellitus (HCC)    Chronic.  Controlled.  Continue with current medication regimen of Atorvastatin 63m daily.  Labs ordered today.  Return to clinic in 6 months for reevaluation.  Call sooner if concerns arise.        Relevant Medications   Dulaglutide (TRULICITY) 1.5 MJT/7.0VXSOPN   Other Relevant Orders   Lipid Profile  Type 2 diabetes mellitus with peripheral neuropathy (HCC)    Chronic. Uncontrolled.  Sugars are running 200-300.  Will increase Trulicity to 6.2VO.  Patient receives this through PAP. Will reach out to pharmacist to make sure the increase dose is sent to patient. Patient instructed to do two injections until new dose arrives.       Relevant Medications   QUEtiapine (SEROQUEL) 200 MG tablet   Dulaglutide (TRULICITY) 1.5 JJ/0.0XF SOPN   Other Relevant Orders   Comp Met (CMET)   HgB A1c     Other   Anemia    Labs ordered today. Will make recommendations based on lab results.       Relevant Orders   CBC w/Diff   Other Visit Diagnoses     Sleep disturbance       Increase Seroquel to 258m daily.  Advised patient of side effects.          Follow up plan: Return in about 3 months (around 09/13/2021) for HTN, HLD, DM2 FU.

## 2021-06-13 NOTE — Progress Notes (Addendum)
PCP: [name], NP Cardiology: Dr. Rockey Situ HF Cardiology: Dr. Aundra Dubin  70 y.o. with history of chronic systolic CHF/nonischemic cardiomyopathy, type 2 diabetes, and prior smoking was referred by Dr. Quentin Ore for evaluation of CHF.  She has been noted to have a cardiomyopathy since 2020.  Echo in 11/20 showed EF 25-30%.  LHC in 11/20 and coronary CTA in 9/21 have shown nonobstructive CAD.  She has been evaluated by pulmonary, mild restrictive lung disease and mild COPD.  She quit smoking in 2020, she does not drink ETOH.   Most recent echo in 3/22 showed EF 30-35%, with normal RV.   [place] in 5/22 was worrisome, showing right > left heart failure and low cardiac output with low PAPI.   Zio placed at follow up for frequent PVCs. >20% PVC burden, 1 major morphology.  Today she returns for HF follow up. Remains SOB walking on flat ground short distances. Denies palpitations, CP, dizziness, edema. She sleeps on 2 pillows.  Appetite ok. No fever or chills. Weight at home stable. Taking all medications. She has daytime sleepiness. She has her cMRI scheduled for next month.  ECG (personally reviewed): SR with frequent PVCs  Labs (6/22): K 4.4, creatinine 0.79 Labs (8/22): K 4.5, creatinine 0.74  PMH: 1. Chronic systolic CHF: Nonischemic cardiomyopathy.   - Echo (6/20): Normal EF - [place] (6/20): Anterior ischemia.  - Echo (11/20): EF 25-30% - LHC (11/20): 70% pRCA stenosis, FFR negative.  - Echo (8/21): EF 30-35% - Coronary CTA (9/21): Mild RCA disease, 40-59% mLAD stenosis but CT FFR negative.  - Echo (3/22): EF 30-35% with normal RV - RHC (5/22): mean RA 17, mean PA 28, mean PCWP 20, CI 1.3, PAPI < 1.  2. COPD: Mild.  Quit smoking in 2020.  3. Restrictive lung disease: Mild restriction on PFTs.  4. Type 2 diabetes 5. Coronary artery disease: Nonobstructive.  6. Hyperlipidemia 7. OSA 8. GERD 9. Depression 10. PVCs: Zio 7 day (8/22) Mostly NSR, no atrial fibrillation, with >20%   FH:  Brother with "heart surgery," ?CHF.  SH: Married, no ETOH, quit smoking in 2020, retired, lives in [place]: All systems reviewed and negative except as per HPI.   Current Outpatient Medications  Medication Sig Dispense Refill   aspirin 81 MG tablet Take 81 mg by mouth daily.      atorvastatin (LIPITOR) 10 MG tablet Take 1 tablet (10 mg total) by mouth at bedtime. 90 tablet 3   blood glucose meter kit and supplies Dispense based on patient and insurance preference. Check fasting blood sugar once daily. (FOR ICD E11.69). 1 each 0   dapagliflozin propanediol (FARXIGA) 10 MG TABS tablet Take 1 tablet (10 mg total) by mouth daily before breakfast. 30 tablet 2   digoxin (LANOXIN) 0.125 MG tablet Take 1 tablet (0.125 mg total) by mouth daily. 30 tablet 11   Dulaglutide (TRULICITY) 1.5 DP/8.2UM SOPN Inject 1.5 mg into the skin once a week. 6 mL 1   gabapentin (NEURONTIN) 300 MG capsule Take 1 capsule (300 mg total) by mouth 3 (three) times daily. 90 capsule 0   Lancets (ONETOUCH DELICA PLUS [id number]) MISC 1 Units by Other route daily. 100 each 1   metFORMIN (GLUCOPHAGE) 1000 MG tablet Take 1 tablet (1,000 mg total) by mouth 2 (two) times daily with a meal. 180 tablet 4   metoprolol succinate (TOPROL-XL) 25 MG 24 hr tablet Take 1 tablet (25 mg total) by mouth daily. 180 tablet 2   ONETOUCH ULTRA  test strip 1 each by Other route daily. 100 each 1   pantoprazole (PROTONIX) 40 MG tablet Take 1 tablet (40 mg total) by mouth daily. 90 tablet 4   polyethylene glycol powder (GLYCOLAX/MIRALAX) 17 GM/SCOOP powder Take 17 g by mouth daily.     potassium chloride (KLOR-CON) 10 MEQ tablet Take 1 tablet (10 mEq total) by mouth as directed. Take with your Torsemide. 90 tablet 3   QUEtiapine (SEROQUEL) 200 MG tablet Take 200 mg by mouth daily. Patient takes at bed time.     spironolactone (ALDACTONE) 25 MG tablet Take 25 mg by mouth daily.     torsemide (DEMADEX) 20 MG tablet Take 2 tablets (40 mg total) by  mouth daily. Take Potassium with Torsemide. 60 tablet 11   valsartan (DIOVAN) 40 MG tablet Take 0.5 tablets (20 mg total) by mouth 2 (two) times daily. 30 tablet 11   No current facility-administered medications for this encounter.   BP 102/80   Pulse 81   Wt 106.4 kg (234 lb 9.6 oz)   SpO2 95%   BMI 43.19 kg/m   Wt Readings from Last 3 Encounters:  06/14/21 106.4 kg (234 lb 9.6 oz)  06/13/21 105.5 kg (232 lb 9.6 oz)  05/22/21 106.1 kg (234 lb)   General:  NAD. No resp difficulty HEENT: Normal Neck: Supple. No JVD. Carotids 2+ bilat; no bruits. No lymphadenopathy or thryomegaly appreciated. Cor: PMI nondisplaced. Irregular rate & rhythm. No rubs, gallops or murmurs. Lungs: Clear Abdomen: Obese, nontender, nondistended. No hepatosplenomegaly. No bruits or masses. Good bowel sounds. Extremities: No cyanosis, clubbing, rash, edema Neuro: Alert & oriented x 3, cranial nerves grossly intact. Moves all 4 extremities w/o difficulty. Affect pleasant.   Assessment/Plan: 1. Chronic systolic CHF: Nonischemic cardiomyopathy.  Most recent echo in 3/22 with EF 30-35%, normal RV.  LHC in 11/20 and coronary CTA in 9/21 showed nonobstructive CAD.  [place] in 5/22 was worrisome with R>L heart failure and low cardiac output as well as low PAPI.  She has NYHA class III symptoms.  She is not volume overloaded on exam. - Cardiac MRI scheduled to assess for infiltrative disease and myocarditis.  If EF remains low, will need to consider ICD placement.  Her QRS does not appear wide enough to warrant CRT.  - Continue torsemide 40 mg daily. BMET & BNP today..  - Continue digoxin 0.125 mg daily. Check dig level today. - Continue valsartan 20 mg bid.  She could not tolerate Entresto due to cough.  - Continue spironolactone 25 mg daily.   - Continue dapagliflozin 10 mg daily.  - Continue Toprol XL 25 mg daily (had a cough with Coreg).  2. CAD: nonobstructive by LHC in 11/20 and coronary CTA in 9/21.  - Continue  atorvastatin.  - Continue ASA 81 daily.  3. PVCs: ?Playing role in cardiomyopathy.  - Zio 7-day with >20% PVC burden, 1 predominant morphology. - Start amiodarone 200 mg bid x 10 days then reduce to 200 mg daily. - Recent LFTs mildly elevated, will follow. TSH today. - Discussed side effect profile of medication. - Will refer to EP for PVC evaluation (?ablation given 1 major morphology). 4. Nocturnal hypoxemia: No sleep apnea or central sleep apnea on recent sleep study, however test was inadequate due to inadequate sleep time. We discussed completing in lab PSG however she declined this. - Needs weight loss. - Discussed this today.  Followup in 2-3 months with Dr. Wynema Birch [facility] FNP 06/14/2021

## 2021-06-13 NOTE — Assessment & Plan Note (Signed)
Labs ordered today.  Will make recommendations based on lab results. ?

## 2021-06-14 ENCOUNTER — Encounter (HOSPITAL_COMMUNITY): Payer: Self-pay

## 2021-06-14 ENCOUNTER — Ambulatory Visit (HOSPITAL_COMMUNITY)
Admission: RE | Admit: 2021-06-14 | Discharge: 2021-06-14 | Disposition: A | Discharge status: Home / Self Care | Payer: Medicare Other | Source: Ambulatory Visit | Attending: Family Medicine | Admitting: Family Medicine

## 2021-06-14 VITALS — BP 102/80 | HR 81 | Wt 234.6 lb

## 2021-06-14 DIAGNOSIS — Z794 Long term (current) use of insulin: Secondary | ICD-10-CM | POA: Diagnosis not present

## 2021-06-14 DIAGNOSIS — Z7901 Long term (current) use of anticoagulants: Secondary | ICD-10-CM | POA: Diagnosis not present

## 2021-06-14 DIAGNOSIS — Z7982 Long term (current) use of aspirin: Secondary | ICD-10-CM | POA: Insufficient documentation

## 2021-06-14 DIAGNOSIS — G4734 Idiopathic sleep related nonobstructive alveolar hypoventilation: Secondary | ICD-10-CM | POA: Diagnosis not present

## 2021-06-14 DIAGNOSIS — Z09 Encounter for follow-up examination after completed treatment for conditions other than malignant neoplasm: Secondary | ICD-10-CM | POA: Insufficient documentation

## 2021-06-14 DIAGNOSIS — I5020 Unspecified systolic (congestive) heart failure: Secondary | ICD-10-CM

## 2021-06-14 DIAGNOSIS — E119 Type 2 diabetes mellitus without complications: Secondary | ICD-10-CM | POA: Diagnosis not present

## 2021-06-14 DIAGNOSIS — I493 Ventricular premature depolarization: Secondary | ICD-10-CM

## 2021-06-14 DIAGNOSIS — I428 Other cardiomyopathies: Secondary | ICD-10-CM | POA: Insufficient documentation

## 2021-06-14 DIAGNOSIS — I5022 Chronic systolic (congestive) heart failure: Secondary | ICD-10-CM | POA: Diagnosis not present

## 2021-06-14 DIAGNOSIS — Z79899 Other long term (current) drug therapy: Secondary | ICD-10-CM | POA: Diagnosis not present

## 2021-06-14 DIAGNOSIS — Z7984 Long term (current) use of oral hypoglycemic drugs: Secondary | ICD-10-CM | POA: Diagnosis not present

## 2021-06-14 DIAGNOSIS — I2510 Atherosclerotic heart disease of native coronary artery without angina pectoris: Secondary | ICD-10-CM | POA: Insufficient documentation

## 2021-06-14 DIAGNOSIS — I447 Left bundle-branch block, unspecified: Secondary | ICD-10-CM | POA: Diagnosis not present

## 2021-06-14 DIAGNOSIS — R0602 Shortness of breath: Secondary | ICD-10-CM | POA: Diagnosis not present

## 2021-06-14 DIAGNOSIS — Z87891 Personal history of nicotine dependence: Secondary | ICD-10-CM | POA: Insufficient documentation

## 2021-06-14 DIAGNOSIS — Z8249 Family history of ischemic heart disease and other diseases of the circulatory system: Secondary | ICD-10-CM | POA: Diagnosis not present

## 2021-06-14 LAB — CBC WITH DIFFERENTIAL/PLATELET
Basophils Absolute: 0.1 10*3/uL (ref 0.0–0.2)
Basos: 1 %
EOS (ABSOLUTE): 0.1 10*3/uL (ref 0.0–0.4)
Eos: 1 %
Hematocrit: 44.8 % (ref 34.0–46.6)
Hemoglobin: 14.3 g/dL (ref 11.1–15.9)
Immature Grans (Abs): 0 10*3/uL (ref 0.0–0.1)
Immature Granulocytes: 0 %
Lymphocytes Absolute: 1.9 10*3/uL (ref 0.7–3.1)
Lymphs: 27 %
MCH: 27.2 pg (ref 26.6–33.0)
MCHC: 31.9 g/dL (ref 31.5–35.7)
MCV: 85 fL (ref 79–97)
Monocytes Absolute: 0.5 10*3/uL (ref 0.1–0.9)
Monocytes: 7 %
Neutrophils Absolute: 4.4 10*3/uL (ref 1.4–7.0)
Neutrophils: 64 %
Platelets: 225 10*3/uL (ref 150–450)
RBC: 5.26 x10E6/uL (ref 3.77–5.28)
RDW: 16.3 % — ABNORMAL HIGH (ref 11.7–15.4)
WBC: 6.9 10*3/uL (ref 3.4–10.8)

## 2021-06-14 LAB — COMPREHENSIVE METABOLIC PANEL
ALT: 104 IU/L — ABNORMAL HIGH (ref 0–32)
AST: 77 IU/L — ABNORMAL HIGH (ref 0–40)
Albumin/Globulin Ratio: 2 (ref 1.2–2.2)
Albumin: 4.6 g/dL (ref 3.8–4.8)
Alkaline Phosphatase: 148 IU/L — ABNORMAL HIGH (ref 44–121)
BUN/Creatinine Ratio: 14 (ref 12–28)
BUN: 10 mg/dL (ref 8–27)
Bilirubin Total: 0.5 mg/dL (ref 0.0–1.2)
CO2: 21 mmol/L (ref 20–29)
Calcium: 9.3 mg/dL (ref 8.7–10.3)
Chloride: 99 mmol/L (ref 96–106)
Creatinine, Ser: 0.74 mg/dL (ref 0.57–1.00)
Globulin, Total: 2.3 g/dL (ref 1.5–4.5)
Glucose: 225 mg/dL — ABNORMAL HIGH (ref 65–99)
Potassium: 4.5 mmol/L (ref 3.5–5.2)
Sodium: 140 mmol/L (ref 134–144)
Total Protein: 6.9 g/dL (ref 6.0–8.5)
eGFR: 88 mL/min/{1.73_m2} (ref >59)

## 2021-06-14 LAB — HEMOGLOBIN A1C
Est. average glucose Bld gHb Est-mCnc: 240 mg/dL
Hgb A1c MFr Bld: 10 % — ABNORMAL HIGH (ref 4.8–5.6)

## 2021-06-14 LAB — BASIC METABOLIC PANEL
Anion gap: 10 (ref 5–15)
BUN: 7 mg/dL — ABNORMAL LOW (ref 8–23)
CO2: 22 mmol/L (ref 22–32)
Calcium: 8.8 mg/dL — ABNORMAL LOW (ref 8.9–10.3)
Chloride: 106 mmol/L (ref 98–111)
Creatinine, Ser: 0.7 mg/dL (ref 0.44–1.00)
GFR, Estimated: >60 mL/min (ref >60)
Glucose, Bld: 155 mg/dL — ABNORMAL HIGH (ref 70–99)
Potassium: 4.2 mmol/L (ref 3.5–5.1)
Sodium: 138 mmol/L (ref 135–145)

## 2021-06-14 LAB — LIPID PANEL
Chol/HDL Ratio: 2.4 ratio (ref 0.0–4.4)
Cholesterol, Total: 72 mg/dL — ABNORMAL LOW (ref 100–199)
HDL: 30 mg/dL — ABNORMAL LOW (ref >39)
LDL Chol Calc (NIH): 12 mg/dL (ref 0–99)
Triglycerides: 186 mg/dL — ABNORMAL HIGH (ref 0–149)
VLDL Cholesterol Cal: 30 mg/dL (ref 5–40)

## 2021-06-14 LAB — TSH: TSH: 2.49 u[IU]/mL (ref 0.350–4.500)

## 2021-06-14 LAB — DIGOXIN LEVEL: Digoxin Level: 0.7 ng/mL — ABNORMAL LOW (ref 0.8–2.0)

## 2021-06-14 MED ORDER — AMIODARONE HCL 200 MG PO TABS: ORAL_TABLET | ORAL | 0 refills | Status: DC

## 2021-06-14 NOTE — Patient Instructions (Signed)
START Amiodarone 200 mg, one tab twice a day for 10 days, then decrease to 200 mg one tab daily thereafter  Labs today We will only contact you if something comes back abnormal or we need to make some changes. Otherwise no news is good news!   You have been referred to [facility] Electrophysiology  -they will be in contact with an appointment  Your physician recommends that you schedule a follow-up appointment in: 2-3 months with Dr Aundra Dubin  Do the following things EVERYDAY: Weigh yourself in the morning before breakfast. Write it down and keep it in a log. Take your medicines as prescribed Eat low salt foods--Limit salt (sodium) to 2000 mg per day.  Stay as active as you can everyday Limit all fluids for the day to less than 2 liters  At the [facility], you and your health needs are our priority. As part of our continuing mission to provide you with exceptional heart care, we have created designated Provider Care Teams. These Care Teams include your primary Cardiologist (physician) and Advanced Practice Providers (APPs- Physician Assistants and Nurse Practitioners) who all work together to provide you with the care you need, when you need it.   You may see any of the following providers on your designated Care Team at your next follow up: Dr Glori Bickers Dr Loralie Champagne Dr Patrice Paradise, NP Lyda Jester, [place] Ginnie Smart Audry Riles, PharmD   Please be sure to bring in all your medications bottles to every appointment.   If you have any questions or concerns before your next appointment please send [place] a message through [place] or call our office at [phone].    TO LEAVE A MESSAGE FOR THE NURSE SELECT OPTION 2, PLEASE LEAVE A MESSAGE INCLUDING: YOUR NAME DATE OF BIRTH CALL BACK NUMBER REASON FOR CALL**this is important as we prioritize the call backs  YOU WILL RECEIVE A CALL BACK THE SAME DAY AS LONG AS YOU CALL BEFORE 4:00  PM

## 2021-06-14 NOTE — Progress Notes (Signed)
Please let patient know that her lab work shows that her A1c increased from 8.6 to 10.0.  I have already contacted the pharmacist to increase the dose of Trulicity.  We will likely need to increase it again.  I recommend decreasing Carbohydrate intake.  Patient's cholesterol has improved some which is great news. Please let me know if she has any questions.

## 2021-06-16 ENCOUNTER — Telehealth (HOSPITAL_COMMUNITY): Payer: Self-pay | Admitting: Cardiology

## 2021-06-16 ENCOUNTER — Ambulatory Visit: Payer: Medicare Other | Admitting: Physician Assistant

## 2021-06-16 DIAGNOSIS — K760 Fatty (change of) liver, not elsewhere classified: Secondary | ICD-10-CM

## 2021-06-16 DIAGNOSIS — R7989 Other specified abnormal findings of blood chemistry: Secondary | ICD-10-CM

## 2021-06-16 NOTE — Telephone Encounter (Signed)
-----   Message from Rafael Bihari, [place] sent at 06/14/2021  9:34 PM EDT ----- Recent liver function test from her primary care provider showed they remained elevated. She will need to refrain from Tylenol & ETOH. Will need repeat LFTs in 4-6 weeks please.

## 2021-06-16 NOTE — Telephone Encounter (Signed)
Patient called.  Patient aware.repeat labs 10/7

## 2021-06-19 ENCOUNTER — Telehealth: Payer: Self-pay

## 2021-06-19 ENCOUNTER — Other Ambulatory Visit: Payer: Self-pay | Admitting: Physician Assistant

## 2021-06-19 DIAGNOSIS — E785 Hyperlipidemia, unspecified: Secondary | ICD-10-CM

## 2021-06-19 NOTE — Telephone Encounter (Signed)
Copied from [place] [phone]. Topic: General - Other >> [name] 19, 2021 12:42 PM Leward Quan A wrote: Reason for CRM: Patient called in to inform [name] that she have yet to get her medication Dulaglutide (TRULICITY) 1.5 0000000 SOPN. Stated that if the Rx was sent out on 06/13/21 she should have gotten the medication already Please advise. Can be reached at Ph#  [phone]   Routing to provider and to PharmD. Looks like patient gets this through patient assistance.

## 2021-06-19 NOTE — Telephone Encounter (Signed)
This has to go through Patient Assistance.  We are working on increasing the dose with the PAP.

## 2021-06-19 NOTE — Telephone Encounter (Signed)
Patient notified and verbalized understanding. Patient advised to give our office a call if she has any concerns or she does not receive her medication.

## 2021-06-20 ENCOUNTER — Telehealth: Payer: Medicare Other

## 2021-06-20 ENCOUNTER — Telehealth: Payer: Self-pay

## 2021-06-20 NOTE — Progress Notes (Deleted)
Chronic Care Management Pharmacy Note  06/20/2021 Name:  KAMERAN LALLIER MRN:  [id number] DOB:  Oct 17, 1951  Summary:  Recommendations/Changes made from today's visit:  Plan:  Subjective: Kim Buchanan is an 70 y.o. year old female who is a primary patient of [name], NP.  The CCM team was consulted for assistance with disease management and care coordination needs.    {CCMTELEPHONEFACETOFACE:21091510} for {CCMINITIALFOLLOWUPCHOICE:21091511} in response to provider referral for pharmacy case management and/or care coordination services.   Consent to Services:  The patient was given information about Chronic Care Management services, agreed to services, and gave verbal consent prior to initiation of services.  Please see initial visit note for detailed documentation.   Patient Care Team: [name], NP as PCP - General (Nurse Practitioner) [name], MD as PCP - Cardiology (Cardiology) [name], MD as PCP - Electrophysiology (Cardiology) [name], OD as Consulting Physician (Optometry) [name], MD as Consulting Physician (Pulmonary Disease)  Recent office visits: ***  Recent consult visits: [facility] visits: {Hospital DC Yes/No:21091515}  Objective:  Lab Results  Component Value Date   CREATININE 0.70 06/14/2021   CREATININE 0.74 06/13/2021   CREATININE 0.80 05/31/2021    Lab Results  Component Value Date   HGBA1C 10.0 (H) 06/13/2021   Last diabetic Eye exam:  Lab Results  Component Value Date/Time   HMDIABEYEEXA No Retinopathy 02/07/2021 09:12 AM    Last diabetic Foot exam: No results found for: HMDIABFOOTEX      Component Value Date/Time   CHOL 72 (L) 06/13/2021 0836   TRIG 186 (H) 06/13/2021 0836   HDL 30 (L) 06/13/2021 0836   CHOLHDL 2.4 06/13/2021 0836   CHOLHDL 1.7 01/01/2018 0000   VLDL 29 08/06/2016 0824   LDLCALC 12 06/13/2021 0836   LDLCALC 29 01/01/2018 0000    Hepatic Function  Latest Ref Rng & Units 06/13/2021 01/23/2021 11/30/2019  Total Protein 6.0 - 8.5 g/dL 6.9 6.9 6.7  Albumin 3.8 - 4.8 g/dL 4.6 4.0 4.0  AST 0 - 40 IU/L 77(H) 76(H) 27  ALT 0 - 32 IU/L 104(H) 116(H) 48(H)  Alk Phosphatase 44 - 121 IU/L 148(H) 105 96  Total Bilirubin 0.0 - 1.2 mg/dL 0.5 0.5 0.4    Lab Results  Component Value Date/Time   TSH 2.490 06/14/2021 09:54 AM   TSH 2.440 10/17/2020 10:27 AM   TSH 2.627 08/14/2019 09:48 AM   FREET4 0.73 08/14/2019 09:48 AM    CBC Latest Ref Rng & Units 06/13/2021 05/22/2021 03/13/2021  WBC 3.4 - 10.8 x10E3/uL 6.9 8.5 7.8  Hemoglobin 11.1 - 15.9 g/dL 14.3 15.1(H) 14.4  Hematocrit 34.0 - 46.6 % 44.8 49.2(H) 43.7  Platelets 150 - 450 x10E3/uL 225 353 327    No results found for: VD25OH  Clinical ASCVD: {YES/NO:21197} The ASCVD Risk score [name], et al., [name]    Other: (CHADS2VASc if Afib, PHQ9 if depression, MMRC or CAT for COPD, ACT, DEXA)  Social History   Tobacco Use  Smoking Status Former   Packs/day: 1.50   Years: 50.00   Pack years: 75.00   Types: Cigarettes   Quit date: 11/29/2018   Years since quitting: 2.5  Smokeless Tobacco Never  Tobacco Comments   started at age 89   BP Readings from Last 3 Encounters:  06/14/21 102/80  06/13/21 (!) 161/74  05/22/21 128/78   Pulse Readings from Last 3 Encounters:  06/14/21 81  06/13/21 86  05/22/21 (!) 55   Wt Readings from Last 3 Encounters:  06/14/21 234 lb 9.6 oz (106.4 kg)  06/13/21 232 lb 9.6 oz (105.5 kg)  05/22/21 234 lb (106.1 kg)    Assessment: Review of patient past medical history, allergies, medications, health status, including review of consultants reports, laboratory and other test data, was performed as part of comprehensive evaluation and provision of chronic care management services.   SDOH:  (Social Determinants of Health) assessments and interventions  performed:    CCM Care Plan  Allergies  Allergen Reactions   Lisinopril Cough    Medications Reviewed Today     Reviewed by Rafael Bihari, FNP (Family Nurse Practitioner) on 06/14/21 at 2126  Med List Status: <None>   Medication Order Taking? Sig Documenting Provider Last Dose Status Informant  amiodarone (PACERONE) 200 MG tablet [id number] Yes Take 1 tablet (200 mg total) by mouth 2 (two) times daily for 10 days, THEN 1 tablet (200 mg total) daily. Allena Katz [place], [place]  Active   aspirin 81 MG tablet [id number] Yes Take 81 mg by mouth daily.  [provider] Taking Active Self           Med Note Josiah Lobo, [facility]   Fri Jul 19, 2017 12:33 PM)    atorvastatin (LIPITOR) 10 MG tablet [id number] Yes Take 1 tablet (10 mg total) by mouth at bedtime. Rise Mu, PA-C Taking Active Self  blood glucose meter kit and supplies [id number] Yes Dispense based on patient and insurance preference. Check fasting blood sugar once daily. (FOR ICD E11.69). Virginia Crews, MD Taking Active Self  dapagliflozin propanediol (FARXIGA) 10 MG TABS tablet [id number] Yes Take 1 tablet (10 mg total) by mouth daily before breakfast. Marnee Guarneri T, NP Taking Active Self  digoxin (LANOXIN) 0.125 MG tablet [id number] Yes Take 1 tablet (0.125 mg total) by mouth daily. Larey Dresser, MD Taking Active   Dulaglutide (TRULICITY) 1.5 PQ/9.8YM [place] [id number] Yes Inject 1.5 mg into the skin once a week. [name], NP Taking Active   gabapentin (NEURONTIN) 300 MG capsule [id number] Yes Take 1 capsule (300 mg total) by mouth 3 (three) times daily. [name], NP Taking Active Self  Lancets (ONETOUCH DELICA PLUS [id number]) [place] [id number] Yes 1 Units by Other route daily. [name], NP Taking Active   metFORMIN (GLUCOPHAGE) 1000 MG tablet [id number] Yes Take 1 tablet (1,000 mg total) by mouth 2 (two) times daily with a meal. Cannady, Jolene T, NP Taking Active Self  metoprolol succinate  (TOPROL-XL) 25 MG 24 hr tablet [id number] Yes Take 1 tablet (25 mg total) by mouth daily. Nelva Bush, MD Taking Active   [facility] ULTRA test strip [id number] Yes 1 each by Other route daily. [name], NP Taking Active   pantoprazole (PROTONIX) 40 MG tablet [id number] Yes Take 1 tablet (40 mg total) by mouth daily. Marnee Guarneri T, NP Taking Active Self  polyethylene glycol powder (GLYCOLAX/MIRALAX) 17 GM/SCOOP powder [id number] Yes Take 17 g by mouth daily. [provider] Taking Active Self  potassium chloride (KLOR-CON) 10 MEQ tablet [id number] Yes Take 1 tablet (10 mEq total) by mouth as directed. Take with your Torsemide. Rise Mu, PA-C Taking Active Self  QUEtiapine (SEROQUEL) 200 MG tablet [id number] Yes Take 200 mg by mouth daily. Patient takes at bed time. [provider] Taking Active   spironolactone (ALDACTONE) 25 MG tablet [id number]  Yes Take 25 mg by mouth daily. [provider] Taking Active   torsemide (DEMADEX) 20 MG tablet [id number] Yes Take 2 tablets (40 mg total) by mouth daily. Take Potassium with Torsemide. Larey Dresser, MD Taking Active   valsartan (DIOVAN) 40 MG tablet [id number] Yes Take 0.5 tablets (20 mg total) by mouth 2 (two) times daily. Larey Dresser, MD Taking Active             Patient Active Problem List   Diagnosis Date Noted   OSA (obstructive sleep apnea) 09/18/2020   Aortic atherosclerosis ([place]) 06/10/2020   Hepatic steatosis 06/10/2020   HFrEF (heart failure with reduced ejection fraction) ([place]) 06/10/2020   Osteoarthritis of left hip 03/16/2020   Type 2 diabetes mellitus with peripheral neuropathy ([place]) 12/11/2019   NICM (nonischemic cardiomyopathy) ([place]) 09/11/2019   CAD (coronary artery disease), native coronary artery 09/11/2019   Hypertension associated with diabetes ([place]) 06/05/2019   Morbid obesity ([place]) 10/03/2018   Anemia 10/03/2018   COPD (chronic obstructive pulmonary disease) ([place]) 03/13/2018    Insomnia 11/06/2017   Hyperlipidemia associated with type 2 diabetes mellitus ([place]) 07/19/2017   S/P vaginal hysterectomy 12/17/2016   Vaginal atrophy 10/31/2016   Hx of colonic polyps    DDD (degenerative disc disease), lumbar 02/21/2016   Low back pain 02/14/2016   GAD (generalized anxiety disorder) 06/06/2015   MDD (major depressive disorder) 06/06/2015   Gastroesophageal reflux disease 06/06/2015    Immunization History  Administered Date(s) Administered   Influenza, High Dose Seasonal PF 06/16/2019, 07/04/2020   Influenza, Seasonal, Injecte, Preservative Fre 08/18/2008, 07/14/2014   Influenza,inj,Quad PF,6+ Mos 09/25/2013, 08/05/2015, 08/02/2016, 07/24/2017   Moderna Sars-Covid-2 Vaccination 12/02/2019, 12/30/2019, 10/29/2020   Pneumococcal Conjugate-13 03/13/2018   Pneumococcal Polysaccharide-23 08/06/2019   Tdap 08/08/2009, 05/13/2014    Conditions to be addressed/monitored: {CCM ASSESSMENT DISEASE OPTIONS:25047}  There are no care plans that you recently modified to display for this patient.   Medication Assistance: {MEDASSISTANCEINFO:25044}  Patient's preferred pharmacy is:  [facility] [place] [address] Phone: [phone] Fax: [phone]  [facility]  ([place], [place] [facility] [address] [place] [address] [place] [address] Phone: [phone] Fax: [phone]  [facility] [place], [place] - [address] [place] [address] Phone: [phone] Fax: [phone]  Uses pill box? {Yes or If no, why not?:20788} Pt endorses ***% compliance  Follow Up:  {FOLLOWUP:24991}  Plan: {CM FOLLOW UP PLAN:25073}  SIG***

## 2021-06-20 NOTE — Telephone Encounter (Signed)
Chronic Care Management  Outreach Note   Name: Kim Buchanan MRN: [id number] DOB: Mar 01, 1951  Referred by: [name], NP Reason for referral: Telephone Appointment with Clinical Pharmacist, Madelin Rear.   An unsuccessful telephone outreach was attempted today. The patient was referred to the pharmacist for assistance with care management and care coordination.   Telephone appointment with clinical pharmacist today (06/20/2021) at [facility]. If patient immediately returns call, transfer to [place] or directly to [phone]. Otherwise, please provide my assistant's contact information, Corrie Mckusick, [phone], so patient can reschedule visit.   Madelin Rear, PharmD, CPP Clinical Pharmacist Practitioner  [phone]

## 2021-06-21 ENCOUNTER — Telehealth: Payer: Self-pay | Admitting: *Deleted

## 2021-06-21 DIAGNOSIS — G4733 Obstructive sleep apnea (adult) (pediatric): Secondary | ICD-10-CM

## 2021-06-21 NOTE — Telephone Encounter (Signed)
The patient has been notified of the result and verbalized understanding.  LMTCB ON CELL. Marolyn Hammock, [place] 06/21/2021 12:45 PM

## 2021-06-21 NOTE — Telephone Encounter (Signed)
-----   Message from Sueanne Margarita, MD sent at 06/03/2021  5:50 PM EDT ----- Sleep study was non diagnostic due to lack of adequate sleep time data.  She does have nocturnal hypoxemia - please set up in lab PSG

## 2021-06-22 ENCOUNTER — Telehealth: Payer: Self-pay

## 2021-06-22 NOTE — Telephone Encounter (Signed)
Copied from [place] [id number]. Topic: General - Other >> [name] 20, 2021  1:55 PM Valere Dross wrote: Reason for CRM: Pt called in stating she has still not received her Dulaglutide (TRULICITY) 1.5 0000000 SOPN  medication through the mail yet. Pt asked if PCP could resend it. Please advise

## 2021-06-22 NOTE — Telephone Encounter (Signed)
Please confirm Rx got to pharmacy. If not I'll resend. Thanks.

## 2021-06-23 NOTE — Telephone Encounter (Signed)
Spoke with patient pharmacy to follow up to see if patient medication was delivered. Pharmacist informed me that they shipped it out and it was delivered to patient's porch on 06/20/21 at 3:06 pm at her front door. Spoke with patient to notify her and patient states she received her medication.

## 2021-07-04 ENCOUNTER — Telehealth (HOSPITAL_COMMUNITY): Payer: Self-pay | Admitting: Emergency Medicine

## 2021-07-04 ENCOUNTER — Telehealth (HOSPITAL_COMMUNITY): Payer: Self-pay | Admitting: *Deleted

## 2021-07-04 NOTE — Telephone Encounter (Signed)
Reaching out to patient to offer assistance regarding upcoming cardiac imaging study; pt verbalizes understanding of appt date/time, parking situation and where to check in, pre-test NPO status and medications ordered, and verified current allergies; name and call back number provided for further questions should they arise Marchia Bond RN Navigator Cardiac Imaging [facility] [phone] office [phone] cell   Some claustro Denies implants Denies iv issues Holding diuretics

## 2021-07-04 NOTE — Telephone Encounter (Signed)
Attempted to call patient regarding upcoming cardiac MR appointment. Left message on voicemail with name and callback number Megan Hayduk RN Navigator Cardiac Imaging [facility] [phone] Office [phone] Cell  

## 2021-07-05 ENCOUNTER — Ambulatory Visit: Payer: Medicare Other

## 2021-07-05 DIAGNOSIS — G4733 Obstructive sleep apnea (adult) (pediatric): Secondary | ICD-10-CM

## 2021-07-06 ENCOUNTER — Other Ambulatory Visit: Payer: Self-pay

## 2021-07-06 ENCOUNTER — Ambulatory Visit (HOSPITAL_COMMUNITY)
Admission: RE | Admit: 2021-07-06 | Discharge: 2021-07-06 | Disposition: A | Discharge status: Home / Self Care | Payer: Medicare Other | Source: Ambulatory Visit | Attending: Cardiology | Admitting: Cardiology

## 2021-07-06 DIAGNOSIS — I5020 Unspecified systolic (congestive) heart failure: Secondary | ICD-10-CM | POA: Insufficient documentation

## 2021-07-06 MED ORDER — GADOBUTROL 1 MMOL/ML IV SOLN
10.0000 mL | Freq: Once | INTRAVENOUS | Status: AC | PRN
  Administered 2021-07-06: 10 mL via INTRAVENOUS

## 2021-07-17 ENCOUNTER — Telehealth: Payer: Self-pay

## 2021-07-17 NOTE — Chronic Care Management (AMB) (Addendum)
Chronic Care Management Pharmacy Assistant   Name: Kim Buchanan  MRN: [id number] DOB: 11/01/1950  Kim Buchanan is an 70 y.o. year old female who presents for his initial CCM visit with the clinical pharmacist.  Reason for Encounter: Disease State General  Recent office visits:  N/A  Recent consult visits:  N/A  Hospital visits:  None in previous 6 months  Medications: Outpatient Encounter Medications as of 07/17/2021  Medication Sig   amiodarone (PACERONE) 200 MG tablet Take 1 tablet (200 mg total) by mouth 2 (two) times daily for 10 days, THEN 1 tablet (200 mg total) daily.   aspirin 81 MG tablet Take 81 mg by mouth daily.    atorvastatin (LIPITOR) 10 MG tablet Take 1 tablet (10 mg total) by mouth at bedtime.   blood glucose meter kit and supplies Dispense based on patient and insurance preference. Check fasting blood sugar once daily. (FOR ICD E11.69).   dapagliflozin propanediol (FARXIGA) 10 MG TABS tablet Take 1 tablet (10 mg total) by mouth daily before breakfast.   digoxin (LANOXIN) 0.125 MG tablet Take 1 tablet (0.125 mg total) by mouth daily.   Dulaglutide (TRULICITY) 1.5 MG/0.5ML SOPN Inject 1.5 mg into the skin once a week.   gabapentin (NEURONTIN) 300 MG capsule Take 1 capsule (300 mg total) by mouth 3 (three) times daily.   Lancets (ONETOUCH DELICA PLUS LANCET30G) MISC 1 Units by Other route daily.   metFORMIN (GLUCOPHAGE) 1000 MG tablet Take 1 tablet (1,000 mg total) by mouth 2 (two) times daily with a meal.   metoprolol succinate (TOPROL-XL) 25 MG 24 hr tablet Take 1 tablet (25 mg total) by mouth daily.   ONETOUCH ULTRA test strip 1 each by Other route daily.   pantoprazole (PROTONIX) 40 MG tablet Take 1 tablet (40 mg total) by mouth daily.   polyethylene glycol powder (GLYCOLAX/MIRALAX) 17 GM/SCOOP powder Take 17 g by mouth daily.   potassium chloride (KLOR-CON) 10 MEQ tablet Take 1 tablet (10 mEq total) by mouth as directed. Take with your Torsemide.    QUEtiapine (SEROQUEL) 200 MG tablet Take 200 mg by mouth daily. Patient takes at bed time.   spironolactone (ALDACTONE) 25 MG tablet Take 25 mg by mouth daily.   torsemide (DEMADEX) 20 MG tablet Take 2 tablets (40 mg total) by mouth daily. Take Potassium with Torsemide.   valsartan (DIOVAN) 40 MG tablet Take 0.5 tablets (20 mg total) by mouth 2 (two) times daily.   No facility-administered encounter medications on file as of 07/17/2021.    Have you had any problems recently with your health? Pt stated she is not having any new issues at the moment.  Have you had any problems with your pharmacy? Pt says everything is okay with her pharmacy and she appreciates the assistance with getting her more expensive medications.  What issues or side effects are you having with your medications? Pt stated she is not having any side effects that she is aware of.  What would you like me to pass along to Jacob Potts,CPP for them to help you with?  Nothing at the moment.  What can we do to take care of you better? Pt stated she is fine at this time.  Star Rating Drugs: atorvastatin (LIPITOR) 10 MG tablet last filled 06/19/21 90 DS valsartan (DIOVAN) 40 MG tablet last filled 06/19/21 30 DS  Ashley Mays CMA Clinical Pharmacist Assistant [phone]  MADE 3 UNSUCCESSFUL ATTEMPTS TO REACH PT 

## 2021-07-19 ENCOUNTER — Ambulatory Visit (INDEPENDENT_AMBULATORY_CARE_PROVIDER_SITE_OTHER): Payer: Medicare Other

## 2021-07-19 ENCOUNTER — Other Ambulatory Visit: Payer: Self-pay

## 2021-07-19 ENCOUNTER — Ambulatory Visit (INDEPENDENT_AMBULATORY_CARE_PROVIDER_SITE_OTHER): Payer: Medicare Other | Admitting: Cardiology

## 2021-07-19 ENCOUNTER — Telehealth: Payer: Self-pay

## 2021-07-19 VITALS — BP 126/70 | HR 84 | Ht 61.0 in | Wt 229.0 lb

## 2021-07-19 DIAGNOSIS — G4733 Obstructive sleep apnea (adult) (pediatric): Secondary | ICD-10-CM

## 2021-07-19 DIAGNOSIS — I10 Essential (primary) hypertension: Secondary | ICD-10-CM | POA: Diagnosis not present

## 2021-07-19 DIAGNOSIS — I5042 Chronic combined systolic (congestive) and diastolic (congestive) heart failure: Secondary | ICD-10-CM | POA: Diagnosis not present

## 2021-07-19 DIAGNOSIS — I428 Other cardiomyopathies: Secondary | ICD-10-CM | POA: Diagnosis not present

## 2021-07-19 DIAGNOSIS — I493 Ventricular premature depolarization: Secondary | ICD-10-CM

## 2021-07-19 NOTE — Patient Instructions (Addendum)
Medication Instructions:  Your physician recommends that you continue on your current medications as directed. Please refer to the Current Medication list given to you today. *If you need a refill on your cardiac medications before your next appointment, please call your pharmacy*  Lab Work: You will get lab work today:  CMP, TSH and free T4 If you have labs (blood work) drawn today and your tests are completely normal, you will receive your results only by: [place] (if you have [place]) OR A paper copy in the mail If you have any lab test that is abnormal or we need to change your treatment, we will call you to review the results.  Testing/Procedures: Your physician has recommended that you wear a holter monitor. Holter monitors are medical devices that record the heart's electrical activity. Doctors most often use these monitors to diagnose arrhythmias. Arrhythmias are problems with the speed or rhythm of the heartbeat. The monitor is a small, portable device. You can wear one while you do your normal daily activities. This is usually used to diagnose what is causing palpitations/syncope (passing out).  You will wear a 7 day monitor in 2 months (09/18/2021)  Follow-Up: At [facility], you and your health needs are our priority.  As part of our continuing mission to provide you with exceptional heart care, we have created designated Provider Care Teams.  These Care Teams include your primary Cardiologist (physician) and Advanced Practice Providers (APPs -  Physician Assistants and Nurse Practitioners) who all work together to provide you with the care you need, when you need it.  Your next appointment:   Your physician wants you to follow-up in: 3 months with one of the following Advanced Practice Providers on your designated Care Team:   Murray Hodgkins, NP Christell Faith, PA-C Marrianne Mood, PA-C Cadence Kathlen Mody, [place]  Your physician has recommended that you wear a Zio monitor.    This monitor is a medical device that records the heart's electrical activity. Doctors most often use these monitors to diagnose arrhythmias. Arrhythmias are problems with the speed or rhythm of the heartbeat. The monitor is a small device applied to your chest. You can wear one while you do your normal daily activities. While wearing this monitor if you have any symptoms to push the button and record what you felt. Once you have worn this monitor for the period of time provider prescribed (Usually 14 days), you will return the monitor device in the postage paid box. Once it is returned they will download the data collected and provide [place] with a report which the provider will then review and we will call you with those results. Important tips:  Avoid showering during the first 24 hours of wearing the monitor. Avoid excessive sweating to help maximize wear time. Do not submerge the device, no hot tubs, and no swimming pools. Keep any lotions or oils away from the patch. After 24 hours you may shower with the patch on. Take brief showers with your back facing the shower head.  Do not remove patch once it has been placed because that will interrupt data and decrease adhesive wear time. Push the button when you have any symptoms and write down what you were feeling. Once you have completed wearing your monitor, remove and place into box which has postage paid and place in your outgoing mailbox.  If for some reason you have misplaced your box then call our office and we can provide another box and/or mail it off  for you.

## 2021-07-19 NOTE — Progress Notes (Signed)
Electrophysiology Office Follow up Visit Note:    Date:  07/19/2021   ID:  Kim Buchanan, DOB Apr 18, 1951, MRN [id number]  PCP:  [name], NP  [facility] Cardiologist:  Ida Rogue, MD  [facility] Electrophysiologist:  [name], MD    Interval History:    Kim Buchanan is a 70 y.o. female who presents for a follow up visit. They were last seen in clinic January 04, 2021 for nonischemic cardiomyopathy.  Since that time she is established with Dr. Aundra Dubin in the heart failure clinic.  A cardiac MRI was performed which showed a left ventricular ejection fraction of 40% with evidence of prior myocarditis.  A ZIO monitor also was performed which showed a 20% burden of PVCs.  She was started on amiodarone for this.  She is in clinic today with her husband who have previously met.  She has been tolerating the amiodarone.    Past Medical History:  Diagnosis Date   Anxiety    Arthritis    lower back   COPD (chronic obstructive pulmonary disease) ([place])    Depression    Diabetes mellitus without complication (HCC)    GERD (gastroesophageal reflux disease)    Hyperlipidemia    Morbid obesity (HCC)    NICM (nonischemic cardiomyopathy) ([place])    a. 03/2019 Echo: Nl LVEF; b. 09/04/2019 Echo: EF 25-30%, Gr1 DD, glob HK, nl RV fxn; c. 09/11/2019 Cath: nonobs LAD/RCA dzs. EF 50-55%. RA 10, RV 29/8(11), PA 27/16(20), PCWP 12. CO/CI 5.04/2.51.   Nonobstructive CAD (coronary artery disease)    a. 03/2019 [facility]): EF 63%, ant ischemia; b. 08/2019 Cath: LM nl, LAD 41m, RCA 70p (iFR nl @ 0.97), 50p. EF 50-55%-->Med Rx.   Osteoporosis    Panic attack    PTSD (post-traumatic stress disorder)    Rectal fistula    Sleep apnea    a. Prev wore CPAP but lost insurance and hasn't been using since (now has insurance).   Wears dentures    full upper    Past Surgical History:  Procedure Laterality Date   ABDOMINAL HYSTERECTOMY     CARDIAC CATHETERIZATION     COLONOSCOPY   2014   COLONOSCOPY WITH PROPOFOL N/A 10/01/2016   Procedure: COLONOSCOPY WITH PROPOFOL;  Surgeon: Lucilla Lame, MD;  Location: [place];  Service: Endoscopy;  Laterality: N/A;   CORONARY PRESSURE WIRE/FFR WITH 3D MAPPING N/A 09/11/2019   Procedure: Coronary Pressure Wire/FFR w/3D Mapping;  Surgeon: Nelva Bush, MD;  Location: [place] [facility];  Service: Cardiovascular;  Laterality: N/A;   CYSTOCELE REPAIR N/A 12/17/2016   Procedure: ANTERIOR REPAIR (CYSTOCELE);  Surgeon: Brayton Mars, MD;  Location: [facility];  Service: Gynecology;  Laterality: N/A;   ESOPHAGOGASTRODUODENOSCOPY (EGD) WITH PROPOFOL N/A 08/11/2019   Procedure: ESOPHAGOGASTRODUODENOSCOPY (EGD) WITH PROPOFOL;  Surgeon: Lucilla Lame, MD;  Location: [facility];  Service: Endoscopy;  Laterality: N/A;   POLYPECTOMY  10/01/2016   Procedure: POLYPECTOMY;  Surgeon: Lucilla Lame, MD;  Location: [place];  Service: Endoscopy;;   RECTAL SURGERY  06/28/2015   Rectal prolapse, laparoscopic rectopexy the coldCharm[place], MD; [facility]   RIGHT HEART CATH N/A 03/21/2021   Procedure: RIGHT HEART CATH;  Surgeon: Nelva Bush, MD;  Location: [place] [facility];  Service: Cardiovascular;  Laterality: N/A;   RIGHT/LEFT HEART CATH AND CORONARY ANGIOGRAPHY Bilateral 09/11/2019   Procedure: RIGHT/LEFT HEART CATH AND CORONARY ANGIOGRAPHY;  Surgeon: [name], MD;  Location: [place] [facility];  Service: Cardiovascular;  Laterality: Bilateral;   TUBAL LIGATION     VAGINAL HYSTERECTOMY Bilateral 12/17/2016   Procedure: HYSTERECTOMY VAGINAL WITH BILATERAL SALPINGO OOPHERECTOMY;  Surgeon: Brayton Mars, MD;  Location: [facility];  Service: Gynecology;  Laterality: Bilateral;    Current Medications: No outpatient medications have been marked as taking for the 07/19/21 encounter (Office Visit) with [name], MD.     Allergies:   Lisinopril   Social History   Socioeconomic History   Marital status:  Married    Spouse name: Paediatric nurse   Number of children: 1   Years of education: Not on file   Highest education level: 9th grade  Occupational History    Employer: DISABLED    Comment: for panic attacks  Tobacco Use   Smoking status: Former    Packs/day: 1.50    Years: 50.00    Pack years: 75.00    Types: Cigarettes    Quit date: 11/29/2018    Years since quitting: 2.6   Smokeless tobacco: Never   Tobacco comments:    started at age 65  Vaping Use   Vaping Use: Never used  Substance and Sexual Activity   Alcohol use: No    Alcohol/week: 0.0 standard drinks   Drug use: No   Sexual activity: Not Currently  Other Topics Concern   Not on file  Social History Narrative   Pt had one son, who died when was 66 YO in a MVA.   Social Determinants of Health   Financial Resource Strain: Low Risk    Difficulty of Paying Living Expenses: Not hard at all  Food Insecurity: No Food Insecurity   Worried About Charity fundraiser in the Last Year: Never true   [place] in the Last Year: Never true  Transportation Needs: No Transportation Needs   Lack of Transportation (Medical): No   Lack of Transportation (Non-Medical): No  Physical Activity: Inactive   Days of Exercise per Week: 0 days   Minutes of Exercise per Session: 0 min  Stress: No Stress Concern Present   Feeling of Stress : Not at all  Social Connections: Not on file     Family History: The patient's family history includes Cancer in her sister; Diabetes in her brother, brother, mother, sister, and sister; Healthy in her sister; Heart disease in her brother and father; Hypertension in her father; Lymphoma in her brother and brother. There is no history of Breast cancer, Ovarian cancer, or Colon cancer.  ROS:   Please see the history of present illness.    All other systems reviewed and are negative.  EKGs/Labs/Other Studies Reviewed:    The following studies were reviewed today:  July 06, 2021 cardiac  MRI FINDINGS: Limited images of the lung fields showed no gross abnormalities. Prominent epicardial adipose tissue. Normal left ventricular size and wall thickness with marked septal-lateral dyssynchrony and septal hypokinesis, EF 40%. Normal right ventricular size and systolic function, EF 83%. Normal left and right atrial sizes with lipomatous atrial septal hypertrophy. Trileaflet aortic valve with no significant stenosis or regurgitation. On delayed enhancement imaging, there were small, discrete areas of mid-wall late gadolinium enhancement (LGE) in the apical lateral wall and the apical septal wall.    June 09, 2021 ZIO personally reviewed Patient had a min HR of 66 bpm, max HR of 179 bpm, and avg HR of 93 bpm. Predominant underlying rhythm was Sinus Rhythm. 1 run of Ventricular Tachycardia occurred lasting 13.4 secs with a max rate  of 179 bpm (avg 136 bpm). 1 run of Supraventricular  Tachycardia occurred lasting 6 beats with a max rate of 141 bpm (avg 136 bpm). Isolated SVEs were rare (<1.0%), and no SVE Couplets or SVE Triplets were present. Isolated VEs were frequent (20.6%, [id number]), VE Couplets were rare (<1.0%, 2692), and VE  Triplets were rare (<1.0%, 754). Ventricular Bigeminy and Trigeminy were present.  Conclusion: 1. Predominantly NSR.  2. No atrial fibrillation noted.  3. 20.6% PVCs, one predominant type.  4. 13.4 second run NSVT.  5. 6 beat run of possible atrial tachycardia.    Prior EKGs showed frequent PVCs with monomorphic morphology.  Site of origin appears to be the aorto mitral continuity.     EKG:  The ekg ordered today demonstrates sinus rhythm with a nonspecific interventricular conduction delay and a single PVC.  Recent Labs: 01/27/2021: Magnesium 2.0 05/22/2021: B Natriuretic Peptide 98.4 06/13/2021: ALT 104; Hemoglobin 14.3; Platelets 225 06/14/2021: BUN 7; Creatinine, Ser 0.70; Potassium 4.2; Sodium 138; TSH 2.490  Recent Lipid Panel    Component  Value Date/Time   CHOL 72 (L) 06/13/2021 0836   TRIG 186 (H) 06/13/2021 0836   HDL 30 (L) 06/13/2021 0836   CHOLHDL 2.4 06/13/2021 0836   CHOLHDL 1.7 01/01/2018 0000   VLDL 29 08/06/2016 0824   LDLCALC 12 06/13/2021 0836   LDLCALC 29 01/01/2018 0000    Physical Exam:    VS:  BP 126/70   Pulse 84   Ht $R'5\' 1"'HC$  (1.549 m)   Wt 229 lb (103.9 kg)   SpO2 94%   BMI 43.27 kg/m     Wt Readings from Last 3 Encounters:  07/19/21 229 lb (103.9 kg)  06/14/21 234 lb 9.6 oz (106.4 kg)  06/13/21 232 lb 9.6 oz (105.5 kg)     GEN:  Well nourished, well developed in no acute distress.  Morbidly obese HEENT: Normal NECK: No JVD; No carotid bruits LYMPHATICS: No lymphadenopathy CARDIAC: RRR, no murmurs, rubs, gallops RESPIRATORY:  Clear to auscultation without rales, wheezing or rhonchi  ABDOMEN: Soft, non-tender, non-distended MUSCULOSKELETAL:  No edema; No deformity  SKIN: Warm and dry NEUROLOGIC:  Alert and oriented x 3 PSYCHIATRIC:  Normal affect   ASSESSMENT:    1. Chronic combined systolic and diastolic heart failure ([place])   2. NICM (nonischemic cardiomyopathy) ([place])   3. Essential hypertension   4. PVC's (premature ventricular contractions)    PLAN:    In order of problems listed above:  1. Chronic combined systolic and diastolic heart failure (HCC) NYHA class II-III.  EF 40%.  Warm and dry on exam today.  Follows with Dr. Aundra Dubin.  MRI suggestive of prior myocarditis.  Also with frequent PVCs that have improved on amiodarone.  2. NICM (nonischemic cardiomyopathy) ([place]) See #1  3. Essential hypertension Controlled.  Continue current regimen  4. PVC's (premature ventricular contractions) Seem to have improved on amiodarone.  With the reduced burden of PVCs on amiodarone, EP study and ablation will be difficult.  I will plan on repeating a ZIO monitor for 7 days in about 2 months.  I will have her see one of the PAs in follow-up when those result.  If she still having a large  burden of PVCs on the repeat ZIO monitor, could reconsider ablation.  5.  Amiodarone drug monitoring CMP, TSH and free T4 today    Follow-up with APP in 3 months.   Medication Adjustments/Labs and Tests Ordered: Current medicines are reviewed at length with the  patient today.  Concerns regarding medicines are outlined above.  Orders Placed This Encounter  Procedures   EKG 12-Lead    No orders of the defined types were placed in this encounter.    Signed, Lars Mage, MD, [facility] 07/19/2021 1:41 PM    Electrophysiology [facility]

## 2021-07-20 LAB — COMPREHENSIVE METABOLIC PANEL
ALT: 126 IU/L — ABNORMAL HIGH (ref 0–32)
AST: 97 IU/L — ABNORMAL HIGH (ref 0–40)
Albumin/Globulin Ratio: 2 (ref 1.2–2.2)
Albumin: 4.7 g/dL (ref 3.8–4.8)
Alkaline Phosphatase: 174 IU/L — ABNORMAL HIGH (ref 44–121)
BUN/Creatinine Ratio: 12 (ref 12–28)
BUN: 10 mg/dL (ref 8–27)
Bilirubin Total: 0.6 mg/dL (ref 0.0–1.2)
CO2: 19 mmol/L — ABNORMAL LOW (ref 20–29)
Calcium: 9.6 mg/dL (ref 8.7–10.3)
Chloride: 101 mmol/L (ref 96–106)
Creatinine, Ser: 0.83 mg/dL (ref 0.57–1.00)
Globulin, Total: 2.4 g/dL (ref 1.5–4.5)
Glucose: 253 mg/dL — ABNORMAL HIGH (ref 70–99)
Potassium: 4.6 mmol/L (ref 3.5–5.2)
Sodium: 141 mmol/L (ref 134–144)
Total Protein: 7.1 g/dL (ref 6.0–8.5)
eGFR: 77 mL/min/{1.73_m2} (ref >59)

## 2021-07-20 LAB — T4, FREE: Free T4: 1.31 ng/dL (ref 0.82–1.77)

## 2021-07-20 LAB — TSH: TSH: 4.1 u[IU]/mL (ref 0.450–4.500)

## 2021-07-22 DIAGNOSIS — I493 Ventricular premature depolarization: Secondary | ICD-10-CM | POA: Diagnosis not present

## 2021-07-26 ENCOUNTER — Other Ambulatory Visit: Payer: Self-pay

## 2021-07-26 ENCOUNTER — Encounter: Payer: Self-pay | Admitting: Nurse Practitioner

## 2021-07-26 ENCOUNTER — Ambulatory Visit (INDEPENDENT_AMBULATORY_CARE_PROVIDER_SITE_OTHER): Payer: Medicare Other | Admitting: Nurse Practitioner

## 2021-07-26 VITALS — BP 122/65 | HR 95 | Temp 98.2°F | Wt 228.4 lb

## 2021-07-26 DIAGNOSIS — B379 Candidiasis, unspecified: Secondary | ICD-10-CM | POA: Diagnosis not present

## 2021-07-26 DIAGNOSIS — Z23 Encounter for immunization: Secondary | ICD-10-CM | POA: Diagnosis not present

## 2021-07-26 DIAGNOSIS — N898 Other specified noninflammatory disorders of vagina: Secondary | ICD-10-CM

## 2021-07-26 LAB — WET PREP FOR TRICH, YEAST, CLUE
Clue Cell Exam: NEGATIVE
Trichomonas Exam: NEGATIVE
Yeast Exam: POSITIVE — AB

## 2021-07-26 MED ORDER — FLUCONAZOLE 150 MG PO TABS: 150.0000 mg | ORAL_TABLET | Freq: Once | ORAL | 0 refills | Status: AC

## 2021-07-26 NOTE — Progress Notes (Signed)
BP 122/65   Pulse 95   Temp 98.2 F (36.8 C) (Oral)   Wt 228 lb 6.4 oz (103.6 kg)   SpO2 94%   BMI 43.16 kg/m    Subjective:    Patient ID: Kim Buchanan, female    DOB: 18-Apr-1951, 70 y.o.   MRN: [id number]  HPI: LOUANN HOPSON is a 70 y.o. female  Chief Complaint  Patient presents with   Vaginitis    Patient states she thinks she has a yeast infection. Patient states she became symptomatic about 3 days ago. Patient states she is having burning and itching in her private area. Patient denies trying any over the counter medications.    Vaginal Itching   VAGINAL DISCHARGE Duration: days Discharge description: white  Pruritus: yes Dysuria: no Malodorous: no Urinary frequency: yes Fevers: no Abdominal pain: no  Sexual activity: monogamous History of sexually transmitted diseases: no Recent antibiotic use: no Context: worse  Treatments attempted: none   Relevant past medical, surgical, family and social history reviewed and updated as indicated. Interim medical history since our last visit reviewed. Allergies and medications reviewed and updated.  Review of Systems  Constitutional:  Negative for fever.  Gastrointestinal:  Negative for abdominal pain and vomiting.  Genitourinary:  Positive for frequency and vaginal discharge. Negative for decreased urine volume, dysuria, flank pain, hematuria and urgency.       Vaginal itching  Musculoskeletal:  Negative for back pain.   Per HPI unless specifically indicated above     Objective:    BP 122/65   Pulse 95   Temp 98.2 F (36.8 C) (Oral)   Wt 228 lb 6.4 oz (103.6 kg)   SpO2 94%   BMI 43.16 kg/m   Wt Readings from Last 3 Encounters:  07/26/21 228 lb 6.4 oz (103.6 kg)  07/19/21 229 lb (103.9 kg)  06/14/21 234 lb 9.6 oz (106.4 kg)    Physical Exam Vitals and nursing note reviewed.  Constitutional:      General: She is not in acute distress.    Appearance: Normal appearance. She is normal weight. She is  not ill-appearing, toxic-appearing or diaphoretic.  HENT:     Head: Normocephalic.     Right Ear: External ear normal.     Left Ear: External ear normal.     Nose: Nose normal.     Mouth/Throat:     Mouth: Mucous membranes are moist.     Pharynx: Oropharynx is clear.  Eyes:     General:        Right eye: No discharge.        Left eye: No discharge.     Extraocular Movements: Extraocular movements intact.     Conjunctiva/sclera: Conjunctivae normal.     Pupils: Pupils are equal, round, and reactive to light.  Cardiovascular:     Rate and Rhythm: Normal rate and regular rhythm.     Heart sounds: No murmur heard. Pulmonary:     Effort: Pulmonary effort is normal. No respiratory distress.     Breath sounds: Normal breath sounds. No wheezing or rales.  Abdominal:     General: Abdomen is flat. Bowel sounds are normal. There is no distension.     Palpations: Abdomen is soft.     Tenderness: There is no abdominal tenderness. There is no right CVA tenderness, left CVA tenderness or guarding.  Musculoskeletal:     Cervical back: Normal range of motion and neck supple.  Skin:    General:  Skin is warm and dry.     Capillary Refill: Capillary refill takes less than 2 seconds.  Neurological:     General: No focal deficit present.     Mental Status: She is alert and oriented to person, place, and time. Mental status is at baseline.  Psychiatric:        Mood and Affect: Mood normal.        Behavior: Behavior normal.        Thought Content: Thought content normal.        Judgment: Judgment normal.    Results for orders placed or performed in visit on 07/19/21  Comp Met (CMET)  Result Value Ref Range   Glucose 253 (H) 70 - 99 mg/dL   BUN 10 8 - 27 mg/dL   Creatinine, Ser 0.83 0.57 - 1.00 mg/dL   eGFR 77 >59 mL/min/1.73   BUN/Creatinine Ratio 12 12 - 28   Sodium 141 134 - 144 mmol/L   Potassium 4.6 3.5 - 5.2 mmol/L   Chloride 101 96 - 106 mmol/L   CO2 19 (L) 20 - 29 mmol/L   Calcium  9.6 8.7 - 10.3 mg/dL   Total Protein 7.1 6.0 - 8.5 g/dL   Albumin 4.7 3.8 - 4.8 g/dL   Globulin, Total 2.4 1.5 - 4.5 g/dL   Albumin/Globulin Ratio 2.0 1.2 - 2.2   Bilirubin Total 0.6 0.0 - 1.2 mg/dL   Alkaline Phosphatase 174 (H) 44 - 121 IU/L   AST 97 (H) 0 - 40 IU/L   ALT 126 (H) 0 - 32 IU/L  TSH  Result Value Ref Range   TSH 4.100 0.450 - 4.500 uIU/mL  T4, free  Result Value Ref Range   Free T4 1.31 0.82 - 1.77 ng/dL      Assessment & Plan:   Problem List Items Addressed This Visit   None Visit Diagnoses     Yeast infection    -  Primary   Complete course of Diflucan. Return to clinic if symptoms worsen or fail to improve. Wet Prep + for yeast. Negative for BV.   Relevant Medications   fluconazole (DIFLUCAN) 150 MG tablet   Vaginal itching       Relevant Orders   WET PREP FOR TRICH, YEAST, CLUE   Flu vaccine need       Relevant Orders   Flu Vaccine QUAD High Dose(Fluad)        Follow up plan: Return if symptoms worsen or fail to improve.

## 2021-07-26 NOTE — Progress Notes (Signed)
Results discussed with patient during visit today.  Treated with Diflucan.

## 2021-07-27 ENCOUNTER — Telehealth (HOSPITAL_COMMUNITY): Payer: Self-pay | Admitting: Pharmacist

## 2021-07-27 ENCOUNTER — Telehealth: Payer: Self-pay

## 2021-07-27 MED ORDER — FLUCONAZOLE 150 MG PO TABS: 150.0000 mg | ORAL_TABLET | Freq: Once | ORAL | 0 refills | Status: AC

## 2021-07-27 MED ORDER — DIGOXIN 125 MCG PO TABS: 0.1250 mg | ORAL_TABLET | Freq: Every day | ORAL | 11 refills | Status: DC

## 2021-07-27 NOTE — Telephone Encounter (Signed)
Patient was seen yesterday for a yeast infection and states when she went to the pharmacy she only received one pill. Patient states she took the one pill yesterday and states she feels a little better but still does not feel better. Please advise?

## 2021-07-27 NOTE — Telephone Encounter (Signed)
Copied from [place] [phone]. Topic: General - Other >> Jul 27, 2021  8:20 AM Leward Quan A wrote: Reason for CRM: Patient called in to tell [name] that the one pill she received for the yeast infection did not help and she is needing more or something else can be reached at Ph#  [phone]

## 2021-07-27 NOTE — Telephone Encounter (Signed)
Digoxin prescription sent to [facility] per patient request.   Audry Riles, PharmD, BCPS, BCCP, CPP Heart Failure Clinic Pharmacist [phone]

## 2021-07-27 NOTE — Telephone Encounter (Signed)
Patient was notified and informed that she will take the second dose on Saturday. Patient advised to give our office a call back if she has any questions or if her symptoms worsen. Verbalized understanding.

## 2021-07-27 NOTE — Telephone Encounter (Signed)
I placed an order for a second pill.  She should take it on Saturday.

## 2021-07-28 ENCOUNTER — Telehealth: Payer: Self-pay

## 2021-07-28 ENCOUNTER — Other Ambulatory Visit (HOSPITAL_COMMUNITY): Payer: Medicare Other

## 2021-07-28 NOTE — Chronic Care Management (AMB) (Unsigned)
    Chronic Care Management Pharmacy Assistant   Name: Kim Buchanan  MRN: [id number] DOB: 08/25/1951  Kim Buchanan is an 70 y.o. year old female who presents for her follow-up CCM visit with the clinical pharmacist.  Reason for Encounter: Disease State General    Recent office visits:  07/26/21 [name] NP PCP- pt was seen for Yeast Infection. Pt started Diflucan and no follow up was noted.  Recent consult visits:  07/19/21 Hilton Cork. Quentin Ore MD Cardiology- pt was seen for Chronic combined systolic and diastolic heart failure. An EKG was done in office and pt advised to Follow-up with APP in 3 months.  Hospital visits:  None in previous 6 months  Medications: Outpatient Encounter Medications as of 07/28/2021  Medication Sig   amiodarone (PACERONE) 200 MG tablet Take 1 tablet (200 mg total) by mouth 2 (two) times daily for 10 days, THEN 1 tablet (200 mg total) daily.   aspirin 81 MG tablet Take 81 mg by mouth daily.    atorvastatin (LIPITOR) 10 MG tablet Take 1 tablet (10 mg total) by mouth at bedtime.   blood glucose meter kit and supplies Dispense based on patient and insurance preference. Check fasting blood sugar once daily. (FOR ICD E11.69).   dapagliflozin propanediol (FARXIGA) 10 MG TABS tablet Take 1 tablet (10 mg total) by mouth daily before breakfast.   digoxin (LANOXIN) 0.125 MG tablet Take 1 tablet (0.125 mg total) by mouth daily.   Dulaglutide (TRULICITY) 1.5 BJ/6.2GB SOPN Inject 1.5 mg into the skin once a week.   gabapentin (NEURONTIN) 300 MG capsule Take 1 capsule (300 mg total) by mouth 3 (three) times daily.   Lancets (ONETOUCH DELICA PLUS [id number]) MISC 1 Units by Other route daily.   metFORMIN (GLUCOPHAGE) 1000 MG tablet Take 1 tablet (1,000 mg total) by mouth 2 (two) times daily with a meal.   metoprolol succinate (TOPROL-XL) 25 MG 24 hr tablet Take 1 tablet (25 mg total) by mouth daily.   ONETOUCH ULTRA test strip 1 each by Other route daily.    pantoprazole (PROTONIX) 40 MG tablet Take 1 tablet (40 mg total) by mouth daily.   polyethylene glycol powder (GLYCOLAX/MIRALAX) 17 GM/SCOOP powder Take 17 g by mouth daily.   potassium chloride (KLOR-CON) 10 MEQ tablet Take 1 tablet (10 mEq total) by mouth as directed. Take with your Torsemide.   QUEtiapine (SEROQUEL) 200 MG tablet Take 200 mg by mouth daily. Patient takes at bed time.   spironolactone (ALDACTONE) 25 MG tablet Take 25 mg by mouth daily.   torsemide (DEMADEX) 20 MG tablet Take 2 tablets (40 mg total) by mouth daily. Take Potassium with Torsemide.   valsartan (DIOVAN) 40 MG tablet Take 0.5 tablets (20 mg total) by mouth 2 (two) times daily.   No facility-administered encounter medications on file as of 07/28/2021.    Have you had any problems recently with your health?  Have you had any problems with your pharmacy?  What issues or side effects are you having with your medications?  What would you like me to pass along to [name] for them to help you with?    What can we do to take care of you better?   Star Rating Drugs:  Atorvastatin(Lipitor) 10 MG last fill 06/19/21 90 DS Valsartan (Diovan) 40 MG last fill 05/22/21 30 DS Dulaglutide (Trulicity) 1.5 PX/1.0 ML last fill 06/13/21  [facility] Pharmacist Assistant [phone]

## 2021-07-29 ENCOUNTER — Other Ambulatory Visit (HOSPITAL_COMMUNITY): Payer: Self-pay | Admitting: Nurse Practitioner

## 2021-07-29 NOTE — Telephone Encounter (Signed)
Allena Katz FNP cardiologists prescribed this med

## 2021-08-01 ENCOUNTER — Other Ambulatory Visit (HOSPITAL_COMMUNITY): Payer: Self-pay

## 2021-08-01 MED ORDER — AMIODARONE HCL 200 MG PO TABS: 200.0000 mg | ORAL_TABLET | Freq: Every day | ORAL | 5 refills | Status: DC

## 2021-08-04 DIAGNOSIS — I493 Ventricular premature depolarization: Secondary | ICD-10-CM | POA: Diagnosis not present

## 2021-08-15 ENCOUNTER — Encounter (HOSPITAL_COMMUNITY): Payer: Medicare Other | Admitting: Cardiology

## 2021-08-17 ENCOUNTER — Telehealth: Payer: Self-pay

## 2021-08-17 NOTE — Chronic Care Management (AMB) (Signed)
    Chronic Care Management Pharmacy Assistant   Name: Kim Buchanan  MRN: [id number] DOB: 1951/05/20  Reason for Encounter: Patient assistance application renewal for Trulicity [id number]    Medications: Outpatient Encounter Medications as of 08/17/2021  Medication Sig   amiodarone (PACERONE) 200 MG tablet Take 1 tablet (200 mg total) by mouth daily.   aspirin 81 MG tablet Take 81 mg by mouth daily.    atorvastatin (LIPITOR) 10 MG tablet Take 1 tablet (10 mg total) by mouth at bedtime.   blood glucose meter kit and supplies Dispense based on patient and insurance preference. Check fasting blood sugar once daily. (FOR ICD E11.69).   dapagliflozin propanediol (FARXIGA) 10 MG TABS tablet Take 1 tablet (10 mg total) by mouth daily before breakfast.   digoxin (LANOXIN) 0.125 MG tablet Take 1 tablet (0.125 mg total) by mouth daily.   Dulaglutide (TRULICITY) 1.5 CM/0.3KJ SOPN Inject 1.5 mg into the skin once a week.   gabapentin (NEURONTIN) 300 MG capsule Take 1 capsule (300 mg total) by mouth 3 (three) times daily.   Lancets (ONETOUCH DELICA PLUS [id number]) MISC 1 Units by Other route daily.   metFORMIN (GLUCOPHAGE) 1000 MG tablet Take 1 tablet (1,000 mg total) by mouth 2 (two) times daily with a meal.   metoprolol succinate (TOPROL-XL) 25 MG 24 hr tablet Take 1 tablet (25 mg total) by mouth daily.   ONETOUCH ULTRA test strip 1 each by Other route daily.   pantoprazole (PROTONIX) 40 MG tablet Take 1 tablet (40 mg total) by mouth daily.   polyethylene glycol powder (GLYCOLAX/MIRALAX) 17 GM/SCOOP powder Take 17 g by mouth daily.   potassium chloride (KLOR-CON) 10 MEQ tablet Take 1 tablet (10 mEq total) by mouth as directed. Take with your Torsemide.   QUEtiapine (SEROQUEL) 200 MG tablet Take 200 mg by mouth daily. Patient takes at bed time.   spironolactone (ALDACTONE) 25 MG tablet Take 25 mg by mouth daily.   torsemide (DEMADEX) 20 MG tablet Take 2 tablets (40 mg total) by mouth daily. Take  Potassium with Torsemide.   valsartan (DIOVAN) 40 MG tablet Take 0.5 tablets (20 mg total) by mouth 2 (two) times daily.   No facility-administered encounter medications on file as of 08/17/2021.   Prefilled and mailed patient assistance application form for the upcoming year 2023.

## 2021-08-21 ENCOUNTER — Other Ambulatory Visit: Payer: Self-pay | Admitting: Nurse Practitioner

## 2021-08-21 NOTE — Telephone Encounter (Signed)
Requested medications are due for refill today.  unknown  Requested medications are on the active medications list.  no  Last refill. 09/19/2020  Future visit scheduled.   yes  Notes to clinic.  Medication not delegated. Medication discontinued 06/13/2021.

## 2021-08-22 NOTE — Telephone Encounter (Signed)
Patient is taking 200mg 

## 2021-08-22 NOTE — Telephone Encounter (Signed)
Just an fyi

## 2021-08-22 NOTE — Telephone Encounter (Signed)
Patient has appointment scheduled for Friday, 08/25/2021.

## 2021-08-24 NOTE — Progress Notes (Signed)
BP 138/71   Pulse (!) 46   Temp (!) 97.1 F (36.2 C) (Oral)   Ht '5\' 3"'  (1.6 m)   Wt 228 lb 9.6 oz (103.7 kg)   SpO2 95%   BMI 40.49 kg/m    Subjective:    Patient ID: Kim Buchanan, female    DOB: Jul 01, 1951, 70 y.o.   MRN: [id number]  HPI: Kim Buchanan is a 70 y.o. female  Chief Complaint  Patient presents with   Diabetes   Hyperlipidemia   Hypertension   HYPERTENSION / HYPERLIPIDEMIA Patient states she stopped taking her her toresemde, potassium and amiodarone.  She was not told to stop these by Cardiology. She stopped then on her own. Satisfied with current treatment? yes Duration of hypertension: years BP monitoring frequency: a few times a month BP range: 120/80 BP medication side effects: no Past BP meds:  metoprolol and valsartan Duration of hyperlipidemia: years Cholesterol medication side effects: no Cholesterol supplements: none Past cholesterol medications: atorvastain (lipitor) Medication compliance: excellent compliance Aspirin: yes Recent stressors: no Recurrent headaches: no Visual changes: no Palpitations: no Dyspnea: SOB Chest pain: no Lower extremity edema: no Dizzy/lightheaded: no  DIABETES Hypoglycemic episodes:no Polydipsia/polyuria: no Visual disturbance: no Chest pain: no Paresthesias: no Glucose Monitoring: no  Accucheck frequency: Not Checking  Fasting glucose:  Post prandial:  Evening:  Before meals: Taking Insulin?: yes  Long acting insulin:  Short acting insulin: Blood Pressure Monitoring: a few times a month Retinal Examination: Up to Date Foot Exam: Up to Date Diabetic Education: Not Completed Pneumovax: Up to Date Influenza: Up to Date Aspirin: yes  HEART FAILURE Patient states she is seeing the cardiologist in [place] tomorrow. This will be her second visit.  States her SOB has improved some. Only becomes SOB when she is walking.  She is having a hard time sleeping and would like to increase her seroquel  to help her sleep.   COPD COPD status: controlled Satisfied with current treatment?: yes Oxygen use: no Dyspnea frequency:  Cough frequency:  Rescue inhaler frequency:   Limitation of activity: yes Productive cough:  Last Spirometry:  Pneumovax: Up to Date Influenza: Up to Date   Relevant past medical, surgical, family and social history reviewed and updated as indicated. Interim medical history since our last visit reviewed. Allergies and medications reviewed and updated.  Review of Systems  Eyes:  Negative for visual disturbance.  Respiratory:  Positive for shortness of breath. Negative for cough and chest tightness.   Cardiovascular:  Negative for chest pain, palpitations and leg swelling.  Neurological:  Negative for dizziness and headaches.  Psychiatric/Behavioral:  Positive for sleep disturbance.    Per HPI unless specifically indicated above     Objective:    BP 138/71   Pulse (!) 46   Temp (!) 97.1 F (36.2 C) (Oral)   Ht '5\' 3"'  (1.6 m)   Wt 228 lb 9.6 oz (103.7 kg)   SpO2 95%   BMI 40.49 kg/m   Wt Readings from Last 3 Encounters:  08/25/21 228 lb 9.6 oz (103.7 kg)  07/26/21 228 lb 6.4 oz (103.6 kg)  07/19/21 229 lb (103.9 kg)    Physical Exam Vitals and nursing note reviewed.  Constitutional:      General: She is not in acute distress.    Appearance: Normal appearance. She is normal weight. She is not ill-appearing, toxic-appearing or diaphoretic.  HENT:     Head: Normocephalic.     Right Ear: External ear normal.  Left Ear: External ear normal.     Nose: Nose normal.     Mouth/Throat:     Mouth: Mucous membranes are moist.     Pharynx: Oropharynx is clear.  Eyes:     General:        Right eye: No discharge.        Left eye: No discharge.     Extraocular Movements: Extraocular movements intact.     Conjunctiva/sclera: Conjunctivae normal.     Pupils: Pupils are equal, round, and reactive to light.  Cardiovascular:     Rate and Rhythm:  Normal rate and regular rhythm.     Heart sounds: No murmur heard. Pulmonary:     Effort: Pulmonary effort is normal. No respiratory distress.     Breath sounds: Normal breath sounds. No wheezing or rales.  Musculoskeletal:     Cervical back: Normal range of motion and neck supple.     Right lower leg: No edema.     Left lower leg: No edema.  Skin:    General: Skin is warm and dry.     Capillary Refill: Capillary refill takes less than 2 seconds.  Neurological:     General: No focal deficit present.     Mental Status: She is alert and oriented to person, place, and time. Mental status is at baseline.  Psychiatric:        Mood and Affect: Mood normal.        Behavior: Behavior normal.        Thought Content: Thought content normal.        Judgment: Judgment normal.    Results for orders placed or performed in visit on 07/26/21  WET PREP FOR [place], YEAST, CLUE   Specimen: Sterile Swab   Sterile Swab  Result Value Ref Range   Trichomonas Exam Negative Negative   Yeast Exam Positive (A) Negative   Clue Cell Exam Negative Negative      Assessment & Plan:   Problem List Items Addressed This Visit       Cardiovascular and Mediastinum   Hypertension associated with diabetes ([place])    Chronic. Ongoing.  Patient missed most recent Cardiology appt.  Scheduled in January.  Continue per their recommendations.      Relevant Orders   Comp Met (CMET)   NICM (nonischemic cardiomyopathy) ([place]) - Primary    Chronic. Ongoing.  Patient missed more recent appointment with Heart Failure clinic.  She has stopped her Amiodarone, torsemide, and potassium.  Discussed the importance of medication compliance with patient today.  Patient does not have follow up until January.  Will reach out to Cardiology to find out if they would like her to restart her medications.      Aortic atherosclerosis ([facility])    Chronic. Ongoing.  Patient missed most recent Heart Failure clinic and is scheduled for January.  Continue per their recommendations.  Continue with ASA and Atorvastatin.      HFrEF (heart failure with reduced ejection fraction) (H[facility])    Chronic. Ongoing.  Patient missed more recent appointment with Heart Failure clinic.  She has stopped her Amiodarone, torsemide, and potassium.  Discussed the importance of medication compliance with patient today.  Patient does not have follow up until January.  Will reach out to Cardiology to find out if they would like her to restart her medications.        Respiratory   COPD (chronic obstructive pulmonary disease) (HCC)    Chronic.  Controlled.  Continue  with current medication regimen.  Labs ordered today.  Return to clinic in 3 months for reevaluation.  Call sooner if concerns arise.          Endocrine   Hyperlipidemia associated with type 2 diabetes mellitus (HCC)    Chronic.  Controlled.  Continue with current medication regimen.  Labs ordered today.  Return to clinic in 3 months for reevaluation.  Call sooner if concerns arise.        Relevant Orders   Lipid Profile   Type 2 diabetes mellitus with peripheral neuropathy ([facility])    Chronic. Not well controlled.  Labs ordered today.  If a1c is not at goal of 7 will increase Trulicity to 56m.  Lengthy conversation had with patient today about Carbohydrates and the importance of decreasing them in her diet. Follow up in 3 months for reevaluation.      Relevant Medications   QUEtiapine (SEROQUEL) 200 MG tablet   Other Relevant Orders   HgB A1c   Microalbumin, Urine Waived     Other   Morbid obesity ([place]    Recommend smaller meals, prioritize protein, decrease carbohydrate intake.        Follow up plan: Return in about 3 months (around 11/25/2021) for HTN, HLD, DM2 FU.

## 2021-08-25 ENCOUNTER — Telehealth: Payer: Self-pay | Admitting: Nurse Practitioner

## 2021-08-25 ENCOUNTER — Ambulatory Visit (INDEPENDENT_AMBULATORY_CARE_PROVIDER_SITE_OTHER): Payer: Medicare Other | Admitting: Nurse Practitioner

## 2021-08-25 ENCOUNTER — Other Ambulatory Visit: Payer: Self-pay

## 2021-08-25 ENCOUNTER — Encounter: Payer: Self-pay | Admitting: Nurse Practitioner

## 2021-08-25 VITALS — BP 138/71 | HR 46 | Temp 97.1°F | Ht 63.0 in | Wt 228.6 lb

## 2021-08-25 DIAGNOSIS — I428 Other cardiomyopathies: Secondary | ICD-10-CM | POA: Diagnosis not present

## 2021-08-25 DIAGNOSIS — I700 Atherosclerosis of aorta: Secondary | ICD-10-CM | POA: Diagnosis not present

## 2021-08-25 DIAGNOSIS — I5020 Unspecified systolic (congestive) heart failure: Secondary | ICD-10-CM | POA: Diagnosis not present

## 2021-08-25 DIAGNOSIS — E1169 Type 2 diabetes mellitus with other specified complication: Secondary | ICD-10-CM

## 2021-08-25 DIAGNOSIS — J410 Simple chronic bronchitis: Secondary | ICD-10-CM

## 2021-08-25 DIAGNOSIS — E1142 Type 2 diabetes mellitus with diabetic polyneuropathy: Secondary | ICD-10-CM

## 2021-08-25 DIAGNOSIS — E785 Hyperlipidemia, unspecified: Secondary | ICD-10-CM | POA: Diagnosis not present

## 2021-08-25 DIAGNOSIS — I152 Hypertension secondary to endocrine disorders: Secondary | ICD-10-CM

## 2021-08-25 DIAGNOSIS — E1159 Type 2 diabetes mellitus with other circulatory complications: Secondary | ICD-10-CM

## 2021-08-25 LAB — MICROALBUMIN, URINE WAIVED
Creatinine, Urine Waived: 50 mg/dL (ref 10–300)
Microalb, Ur Waived: 10 mg/L (ref 0–19)

## 2021-08-25 MED ORDER — QUETIAPINE FUMARATE 200 MG PO TABS: 200.0000 mg | ORAL_TABLET | Freq: Every day | ORAL | 1 refills | Status: DC

## 2021-08-25 NOTE — Assessment & Plan Note (Signed)
Chronic. Ongoing.  Patient missed most recent Cardiology appt.  Scheduled in January.  Continue per their recommendations.

## 2021-08-25 NOTE — Assessment & Plan Note (Signed)
Chronic.  Controlled.  Continue with current medication regimen.  Labs ordered today.  Return to clinic in 3 months for reevaluation.  Call sooner if concerns arise.   

## 2021-08-25 NOTE — Telephone Encounter (Signed)
Please reach out to patient's Cardiologist and let them know that she has stopped her Amiodarone, Torsemide, and Potassium.  She missed an appointment with them and is not scheduled until January.    She has no more Amiodarone.  If they would like to restart this please find out the dose she should be taking and I will send it in to the pharmacy.

## 2021-08-25 NOTE — Assessment & Plan Note (Signed)
Chronic. Ongoing.  Patient missed more recent appointment with Heart Failure clinic.  She has stopped her Amiodarone, torsemide, and potassium.  Discussed the importance of medication compliance with patient today.  Patient does not have follow up until January.  Will reach out to Cardiology to find out if they would like her to restart her medications.

## 2021-08-25 NOTE — Assessment & Plan Note (Signed)
Chronic. Ongoing.  Patient missed most recent Heart Failure clinic and is scheduled for January. Continue per their recommendations.  Continue with ASA and Atorvastatin.

## 2021-08-25 NOTE — Assessment & Plan Note (Addendum)
Chronic.  Controlled.  Continue with current medication regimen.  Labs ordered today.  Return to clinic in 3 months for reevaluation.  Call sooner if concerns arise.   

## 2021-08-25 NOTE — Assessment & Plan Note (Signed)
Recommend smaller meals, prioritize protein, decrease carbohydrate intake.

## 2021-08-25 NOTE — Assessment & Plan Note (Addendum)
Chronic. Not well controlled.  Labs ordered today.  If a1c is not at goal of 7 will increase Trulicity to 3mg .  Lengthy conversation had with patient today about Carbohydrates and the importance of decreasing them in her diet. Follow up in 3 months for reevaluation.

## 2021-08-26 LAB — LIPID PANEL
Chol/HDL Ratio: 2.6 ratio (ref 0.0–4.4)
Cholesterol, Total: 74 mg/dL — ABNORMAL LOW (ref 100–199)
HDL: 29 mg/dL — ABNORMAL LOW (ref >39)
LDL Chol Calc (NIH): 14 mg/dL (ref 0–99)
Triglycerides: 198 mg/dL — ABNORMAL HIGH (ref 0–149)
VLDL Cholesterol Cal: 31 mg/dL (ref 5–40)

## 2021-08-26 LAB — HEMOGLOBIN A1C
Est. average glucose Bld gHb Est-mCnc: 237 mg/dL
Hgb A1c MFr Bld: 9.9 % — ABNORMAL HIGH (ref 4.8–5.6)

## 2021-08-26 LAB — COMPREHENSIVE METABOLIC PANEL
ALT: 90 IU/L — ABNORMAL HIGH (ref 0–32)
AST: 73 IU/L — ABNORMAL HIGH (ref 0–40)
Albumin/Globulin Ratio: 2 (ref 1.2–2.2)
Albumin: 4.6 g/dL (ref 3.8–4.8)
Alkaline Phosphatase: 164 IU/L — ABNORMAL HIGH (ref 44–121)
BUN/Creatinine Ratio: 17 (ref 12–28)
BUN: 14 mg/dL (ref 8–27)
Bilirubin Total: 0.7 mg/dL (ref 0.0–1.2)
CO2: 21 mmol/L (ref 20–29)
Calcium: 9 mg/dL (ref 8.7–10.3)
Chloride: 102 mmol/L (ref 96–106)
Creatinine, Ser: 0.84 mg/dL (ref 0.57–1.00)
Globulin, Total: 2.3 g/dL (ref 1.5–4.5)
Glucose: 199 mg/dL — ABNORMAL HIGH (ref 70–99)
Potassium: 4.8 mmol/L (ref 3.5–5.2)
Sodium: 139 mmol/L (ref 134–144)
Total Protein: 6.9 g/dL (ref 6.0–8.5)
eGFR: 76 mL/min/{1.73_m2} (ref >59)

## 2021-08-28 NOTE — Progress Notes (Signed)
Please let patient know that her lab work shows that her liver enzymes are still elevated.  Her A1c is 9.9.  We should increase her Trulicity to 3mg . I have contacted Edison Nasuti about increasing it.  Cholesterol remains relatively the same.  Recommend she decrease the amount of bread and processed foods she is eating to help with the triglycerides and her a1c. Microalbumin shows that she has some protein in her urine. We will continue to monitor this over time.  I will see her in 3 months.

## 2021-08-29 ENCOUNTER — Other Ambulatory Visit: Payer: Self-pay

## 2021-08-29 ENCOUNTER — Ambulatory Visit (INDEPENDENT_AMBULATORY_CARE_PROVIDER_SITE_OTHER): Payer: Medicare Other | Admitting: Nurse Practitioner

## 2021-08-29 ENCOUNTER — Telehealth: Payer: Self-pay | Admitting: Nurse Practitioner

## 2021-08-29 ENCOUNTER — Encounter: Payer: Self-pay | Admitting: Nurse Practitioner

## 2021-08-29 ENCOUNTER — Telehealth: Payer: Self-pay | Admitting: Cardiology

## 2021-08-29 VITALS — BP 112/70 | HR 83 | Temp 97.8°F | Ht 62.99 in | Wt 228.6 lb

## 2021-08-29 DIAGNOSIS — B3731 Acute candidiasis of vulva and vagina: Secondary | ICD-10-CM

## 2021-08-29 DIAGNOSIS — N898 Other specified noninflammatory disorders of vagina: Secondary | ICD-10-CM

## 2021-08-29 DIAGNOSIS — R300 Dysuria: Secondary | ICD-10-CM

## 2021-08-29 LAB — URINALYSIS, ROUTINE W REFLEX MICROSCOPIC
Bilirubin, UA: NEGATIVE
Ketones, UA: NEGATIVE
Nitrite, UA: NEGATIVE
Protein,UA: NEGATIVE
Specific Gravity, UA: 1.015 (ref 1.005–1.030)
Urobilinogen, Ur: 1 mg/dL (ref 0.2–1.0)
pH, UA: 5.5 (ref 5.0–7.5)

## 2021-08-29 LAB — WET PREP FOR TRICH, YEAST, CLUE
Clue Cell Exam: NEGATIVE
Trichomonas Exam: NEGATIVE
Yeast Exam: POSITIVE — AB

## 2021-08-29 LAB — MICROSCOPIC EXAMINATION: Bacteria, UA: NONE SEEN

## 2021-08-29 MED ORDER — FLUCONAZOLE 150 MG PO TABS: 150.0000 mg | ORAL_TABLET | Freq: Every day | ORAL | 0 refills | Status: DC

## 2021-08-29 NOTE — Progress Notes (Signed)
Acute Office Visit  Subjective:    Patient ID: Kim Buchanan, female    DOB: [birth year], 70 y.o.   MRN: [id number]  Chief Complaint  Patient presents with   Burning with urniation    With odor and bladder leakage. For the past week.     HPI Patient is in today for dysuria, and vaginal itching.   VAGINAL ITCHING  Duration: weeks Discharge description:  none   Pruritus: yes Dysuria: yes Malodorous: no Urinary frequency: yes Fevers: no Abdominal pain: no  Recent antibiotic use: no Context: worse and previous yeast infections  Treatments attempted: none   Past Medical History:  Diagnosis Date   Anxiety    Arthritis    lower back   COPD (chronic obstructive pulmonary disease) (HCC)    Depression    Diabetes mellitus without complication (HCC)    GERD (gastroesophageal reflux disease)    Hyperlipidemia    Morbid obesity ([place])    NICM (nonischemic cardiomyopathy) ([place])    a. 03/2019 Echo: Nl LVEF; b. 09/04/2019 Echo: EF 25-30%, Gr1 DD, glob HK, nl RV fxn; c. 09/11/2019 Cath: nonobs LAD/RCA dzs. EF 50-55%. RA 10, RV 29/8(11), PA 27/16(20), PCWP 12. CO/CI 5.04/2.51.   Nonobstructive CAD (coronary artery disease)    a. 03/2019 [facility]): EF 63%, ant ischemia; b. 08/2019 Cath: LM nl, LAD 25m RCA 70p (iFR nl @ 0.97), 50p. EF 50-55%-->Med Rx.   Osteoporosis    Panic attack    PTSD (post-traumatic stress disorder)    Rectal fistula    Sleep apnea    a. Prev wore CPAP but lost insurance and hasn't been using since (now has insurance).   Wears dentures    full upper    Past Surgical History:  Procedure Laterality Date   CARDIAC CATHETERIZATION     COLONOSCOPY  2014   COLONOSCOPY WITH PROPOFOL N/A 10/01/2016   Procedure: COLONOSCOPY WITH PROPOFOL;  Surgeon: DLucilla Lame MD;  [facility]  Service: Endoscopy;  Laterality: N/A;   CORONARY PRESSURE WIRE/FFR WITH 3D MAPPING N/A 09/11/2019   Procedure: Coronary Pressure Wire/FFR w/3D Mapping;  Surgeon:  ENelva Bush MD;  [facility];  Service: Cardiovascular;  Laterality: N/A;   CYSTOCELE REPAIR N/A 12/17/2016   Procedure: ANTERIOR REPAIR (CYSTOCELE);  Surgeon: MBrayton Mars MD;  Location: [facility];  Service: Gynecology;  Laterality: N/A;   ESOPHAGOGASTRODUODENOSCOPY (EGD) WITH PROPOFOL N/A 08/11/2019   Procedure: ESOPHAGOGASTRODUODENOSCOPY (EGD) WITH PROPOFOL;  Surgeon: WLucilla Lame MD;  Location: [facility];  Service: Endoscopy;  Laterality: N/A;   POLYPECTOMY  10/01/2016   Procedure: POLYPECTOMY;  Surgeon: DLucilla Lame MD;  [facility] [facility]  Service: Endoscopy;;   RECTAL SURGERY  06/28/2015   Rectal prolapse, laparoscopic rectopexy [facility], [facility]  RIGHT HEART CATH N/A 03/21/2021   Procedure: RIGHT HEART CATH;  Surgeon: ENelva Bush MD;  [facility];  Service: Cardiovascular;  Laterality: N/A;   RIGHT/LEFT HEART CATH AND CORONARY ANGIOGRAPHY Bilateral 09/11/2019   Procedure: RIGHT/LEFT HEART CATH AND CORONARY ANGIOGRAPHY;  Surgeon: GMinna Merritts MD;  [facility];  Service: Cardiovascular;  Laterality: Bilateral;   TOTAL ABDOMINAL HYSTERECTOMY W/ BILATERAL SALPINGOOPHORECTOMY     TUBAL LIGATION     VAGINAL HYSTERECTOMY Bilateral 12/17/2016   Procedure: HYSTERECTOMY VAGINAL WITH BILATERAL SALPINGO OOPHERECTOMY;  Surgeon: MBrayton Mars MD;  Location: [facility];  Service: Gynecology;  Laterality: Bilateral;    Family History  Problem Relation Age  of Onset   Diabetes Mother    Diabetes Sister    Cancer Sister    Diabetes Brother    Lymphoma Brother    Heart disease Father    Hypertension Father    Diabetes Sister    Diabetes Brother    Lymphoma Brother    Heart disease Brother    Healthy Sister    Breast cancer Neg Hx    Ovarian cancer Neg Hx    Colon cancer Neg Hx     Social History   Socioeconomic History   Marital status: Married    Spouse name: Paediatric nurse    Number of children: 1   Years of education: Not on file   Highest education level: 9th grade  Occupational History    Employer: DISABLED    Comment: for panic attacks  Tobacco Use   Smoking status: Former    Packs/day: 1.50    Years: 50.00    Pack years: 75.00    Types: Cigarettes    Quit date: 11/29/2018    Years since quitting: 2.7   Smokeless tobacco: Never   Tobacco comments:    started at age 10  Vaping Use   Vaping Use: Never used  Substance and Sexual Activity   Alcohol use: No    Alcohol/week: 0.0 standard drinks   Drug use: No   Sexual activity: Not Currently  Other Topics Concern   Not on file  Social History Narrative   Pt had one son, who died when was 39 YO in a MVA.   Social Determinants of Health   Financial Resource Strain: Low Risk    Difficulty of Paying Living Expenses: Not hard at all  Food Insecurity: No Food Insecurity   Worried About Charity fundraiser in the Last Year: Never true   [place] in the Last Year: Never true  Transportation Needs: No Transportation Needs   Lack of Transportation (Medical): No   Lack of Transportation (Non-Medical): No  Physical Activity: Inactive   Days of Exercise per Week: 0 days   Minutes of Exercise per Session: 0 min  Stress: No Stress Concern Present   Feeling of Stress : Not at all  Social Connections: Not on file  Intimate Partner Violence: Not on file    Outpatient Medications Prior to Visit  Medication Sig Dispense Refill   amiodarone (PACERONE) 200 MG tablet Take 1 tablet (200 mg total) by mouth daily. (Patient not taking: Reported on 08/25/2021) 30 tablet 5   aspirin 81 MG tablet Take 81 mg by mouth daily.      atorvastatin (LIPITOR) 10 MG tablet Take 1 tablet (10 mg total) by mouth at bedtime. 90 tablet 2   blood glucose meter kit and supplies Dispense based on patient and insurance preference. Check fasting blood sugar once daily. (FOR ICD E11.69). 1 each 0   dapagliflozin propanediol  (FARXIGA) 10 MG TABS tablet Take 1 tablet (10 mg total) by mouth daily before breakfast. 30 tablet 2   digoxin (LANOXIN) 0.125 MG tablet Take 1 tablet (0.125 mg total) by mouth daily. 30 tablet 11   Dulaglutide (TRULICITY) 1.5 NA/3.5TD SOPN Inject 1.5 mg into the skin once a week. 6 mL 1   gabapentin (NEURONTIN) 300 MG capsule Take 1 capsule (300 mg total) by mouth 3 (three) times daily. 90 capsule 0   Lancets (ONETOUCH DELICA PLUS DUKGUR42H) MISC 1 Units by Other route daily. 100 each 1   metFORMIN (GLUCOPHAGE) 1000 MG tablet  Take 1 tablet (1,000 mg total) by mouth 2 (two) times daily with a meal. 180 tablet 4   metoprolol succinate (TOPROL-XL) 25 MG 24 hr tablet Take 1 tablet (25 mg total) by mouth daily. 180 tablet 2   ONETOUCH ULTRA test strip 1 each by Other route daily. 100 each 1   pantoprazole (PROTONIX) 40 MG tablet Take 1 tablet (40 mg total) by mouth daily. 90 tablet 4   polyethylene glycol powder (GLYCOLAX/MIRALAX) 17 GM/SCOOP powder Take 17 g by mouth daily.     potassium chloride (KLOR-CON) 10 MEQ tablet Take 1 tablet (10 mEq total) by mouth as directed. Take with your Torsemide. (Patient not taking: Reported on 08/25/2021) 90 tablet 3   QUEtiapine (SEROQUEL) 200 MG tablet Take 1 tablet (200 mg total) by mouth daily. Patient takes at bed time. 90 tablet 1   spironolactone (ALDACTONE) 25 MG tablet Take 25 mg by mouth daily.     torsemide (DEMADEX) 20 MG tablet Take 2 tablets (40 mg total) by mouth daily. Take Potassium with Torsemide. (Patient not taking: Reported on 08/25/2021) 60 tablet 11   valsartan (DIOVAN) 40 MG tablet Take 0.5 tablets (20 mg total) by mouth 2 (two) times daily. 30 tablet 11   No facility-administered medications prior to visit.    Allergies  Allergen Reactions   Lisinopril Cough    Review of Systems  Constitutional: Negative.   Respiratory: Negative.    Cardiovascular: Negative.   Gastrointestinal: Negative.   Genitourinary:  Positive for dysuria.  Negative for flank pain, hematuria and urgency.       Vaginal itching      Objective:    Physical Exam Vitals and nursing note reviewed.  Constitutional:      General: She is not in acute distress.    Appearance: Normal appearance.  HENT:     Head: Normocephalic.  Eyes:     Conjunctiva/sclera: Conjunctivae normal.  Cardiovascular:     Rate and Rhythm: Normal rate and regular rhythm.     Pulses: Normal pulses.     Heart sounds: Normal heart sounds.  Pulmonary:     Effort: Pulmonary effort is normal.     Breath sounds: Normal breath sounds.  Abdominal:     Palpations: Abdomen is soft.     Tenderness: There is no abdominal tenderness. There is no right CVA tenderness or left CVA tenderness.  Musculoskeletal:     Cervical back: Normal range of motion.  Skin:    General: Skin is warm.  Neurological:     General: No focal deficit present.     Mental Status: She is alert and oriented to person, place, and time.  Psychiatric:        Mood and Affect: Mood normal.        Behavior: Behavior normal.        Thought Content: Thought content normal.        Judgment: Judgment normal.    BP 112/70 (BP Location: Left Arm, Patient Position: Sitting)   Pulse 83   Temp 97.8 F (36.6 C) (Oral)   Ht 5' 2.99" (1.6 m)   Wt 228 lb 9.6 oz (103.7 kg)   SpO2 97%   BMI 40.50 kg/m  Wt Readings from Last 3 Encounters:  08/29/21 228 lb 9.6 oz (103.7 kg)  08/25/21 228 lb 9.6 oz (103.7 kg)  07/26/21 228 lb 6.4 oz (103.6 kg)    Health Maintenance Due  Topic Date Due   COVID-19 Vaccine (4 - Booster  for Moderna series) 12/24/2020    There are no preventive care reminders to display for this patient.   Lab Results  Component Value Date   TSH 4.100 07/19/2021   Lab Results  Component Value Date   WBC 6.9 06/13/2021   HGB 14.3 06/13/2021   HCT 44.8 06/13/2021   MCV 85 06/13/2021   PLT 225 06/13/2021   Lab Results  Component Value Date   NA 139 08/25/2021   K 4.8 08/25/2021   CO2  21 08/25/2021   GLUCOSE 199 (H) 08/25/2021   BUN 14 08/25/2021   CREATININE 0.84 08/25/2021   BILITOT 0.7 08/25/2021   ALKPHOS 164 (H) 08/25/2021   AST 73 (H) 08/25/2021   ALT 90 (H) 08/25/2021   PROT 6.9 08/25/2021   ALBUMIN 4.6 08/25/2021   CALCIUM 9.0 08/25/2021   ANIONGAP 10 06/14/2021   EGFR 76 08/25/2021   Lab Results  Component Value Date   CHOL 74 (L) 08/25/2021   Lab Results  Component Value Date   HDL 29 (L) 08/25/2021   Lab Results  Component Value Date   LDLCALC 14 08/25/2021   Lab Results  Component Value Date   TRIG 198 (H) 08/25/2021   Lab Results  Component Value Date   CHOLHDL 2.6 08/25/2021   Lab Results  Component Value Date   HGBA1C 9.9 (H) 08/25/2021       Assessment & Plan:   Problem List Items Addressed This Visit   None Visit Diagnoses     Yeast vaginitis    -  Primary   Will treat with diflucan. Discussed blood sugar control. May need to consider d/c farxiga if ongoing yeast infections.    Relevant Medications   fluconazole (DIFLUCAN) 150 MG tablet   Vaginal itching       Wet prep positive for yeast, will treat.   Relevant Orders   WET PREP FOR TRICH, YEAST, CLUE   Dysuria       U/A shows 3+ glucose, no bacteria, trace leukocytes. No UTI noted   Relevant Orders   Urinalysis, Routine w reflex microscopic        Meds ordered this encounter  Medications   fluconazole (DIFLUCAN) 150 MG tablet    Sig: Take 1 tablet (150 mg total) by mouth daily. Take 1 pill today, and then one pill on Friday    Dispense:  2 tablet    Refill:  0      Charyl Dancer, NP

## 2021-08-29 NOTE — Telephone Encounter (Signed)
Patient's PCP called and states patient informed them she has stopped taking her Amiodarone, Torsemide, and Potassium. PCP also asks if patient should be taking Amiodarone. Please call to discuss.

## 2021-08-29 NOTE — Telephone Encounter (Signed)
Contacted by Pt's PCP.  Per PCP-Pt self discontinued amiodarone, lasix and potassium.  Per Dr. Henri Medal Pt make appt with [place] APP to reinitiate medications.  Will send to scheduling.

## 2021-08-29 NOTE — Telephone Encounter (Signed)
Please call patient's cardiologist today.  I sent a message on 11/4 regarding her medications.  We need to find out if they want her to restart them.

## 2021-08-29 NOTE — Telephone Encounter (Signed)
Called over to [facility] and spoke with the receptionist. Provided Karen's message to her and she is sending the message to the nurses to advise. Will call us back to let us know what to do.

## 2021-09-05 ENCOUNTER — Other Ambulatory Visit: Payer: Self-pay | Admitting: Nurse Practitioner

## 2021-09-05 NOTE — Telephone Encounter (Signed)
Requested medications are due for refill today.  yes  Requested medications are on the active medications list.  yes  Last refill. 03/21/2021  Future visit scheduled.   yes  Notes to clinic.  Last rx signed by Nelva Bush, MD.

## 2021-09-07 ENCOUNTER — Encounter: Payer: Self-pay | Admitting: Nurse Practitioner

## 2021-09-07 ENCOUNTER — Ambulatory Visit (INDEPENDENT_AMBULATORY_CARE_PROVIDER_SITE_OTHER): Payer: Medicare Other | Admitting: Nurse Practitioner

## 2021-09-07 ENCOUNTER — Other Ambulatory Visit: Payer: Self-pay

## 2021-09-07 VITALS — BP 120/64 | HR 86 | Ht 61.0 in | Wt 225.0 lb

## 2021-09-07 DIAGNOSIS — I5022 Chronic systolic (congestive) heart failure: Secondary | ICD-10-CM

## 2021-09-07 DIAGNOSIS — E1142 Type 2 diabetes mellitus with diabetic polyneuropathy: Secondary | ICD-10-CM

## 2021-09-07 DIAGNOSIS — I428 Other cardiomyopathies: Secondary | ICD-10-CM | POA: Diagnosis not present

## 2021-09-07 DIAGNOSIS — E782 Mixed hyperlipidemia: Secondary | ICD-10-CM | POA: Diagnosis not present

## 2021-09-07 DIAGNOSIS — I493 Ventricular premature depolarization: Secondary | ICD-10-CM

## 2021-09-07 MED ORDER — AMIODARONE HCL 200 MG PO TABS: 200.0000 mg | ORAL_TABLET | Freq: Every day | ORAL | 5 refills | Status: DC

## 2021-09-07 NOTE — Patient Instructions (Signed)
Medication Instructions:  Your physician has recommended you make the following change in your medication:   RESTART Amiodarone 200 mg once a day  *If you need a refill on your cardiac medications before your next appointment, please call your pharmacy*   Lab Work: None  If you have labs (blood work) drawn today and your tests are completely normal, you will receive your results only by: [facility] [place] (if you have MyChart) OR A paper copy in the mail If you have any lab test that is abnormal or we need to change your treatment, we will call you to review the results.   Testing/Procedures: None   Follow-Up: At [facility], you and your health needs are our priority.  As part of our continuing mission to provide you with exceptional heart care, we have created designated Provider Care Teams.  These Care Teams include your primary Cardiologist (physician) and Advanced Practice Providers (APPs -  Physician Assistants and Nurse Practitioners) who all work together to provide you with the care you need, when you need it.  Your next appointment:   3 month(s)  The format for your next appointment:   In Person  Provider:   Lars Mage, MD   Keep upcoming appointment with Dr. Aundra Dubin in January.

## 2021-09-07 NOTE — Progress Notes (Signed)
Office Visit    Patient Name: Kim Buchanan Date of Encounter: 09/07/2021  Primary Care Provider:  [name], NP Primary Cardiologist:  Ida Rogue, MD CHF: Einar Crow, MD  EP: Grayce Sessions, MD  Chief Complaint    70 year old female with a history of nonischemic cardiomyopathy, chronic HFrEF, PVCs, nonobstructive CAD, diabetes, hyperlipidemia, obesity, sleep apnea, anxiety, PTSD, COPD, and GERD, presents for follow-up related to PVCs and cardiomyopathy.  Past Medical History    Past Medical History:  Diagnosis Date   Anxiety    Arthritis    lower back   COPD (chronic obstructive pulmonary disease) (HCC)    Depression    Diabetes mellitus without complication (HCC)    GERD (gastroesophageal reflux disease)    Hyperlipidemia    Morbid obesity ([place])    NICM (nonischemic cardiomyopathy) ([place])    a. 03/2019 Echo: Nl LVEF; b. 09/04/2019 Echo: EF 25-30%; c. 09/11/2019 Cath: nonobs LAD/RCA dzs. EF 50-55%; c. 12/2020 Echo: EF 30-35%, glob HK. Mild LVH. Nl RVSP. Mild MR; d. 02/2021 RHC: Elev RH pressures. CO/CI 2.8/1.3; e. 06/2021 cMRI: EF 40%, marked sept-lat dyssynch & septal HK. Small, discrete areas of mid-wall LGE in apical lateral and apical septal walls - ? h/o myocarditis.   Nonobstructive CAD (coronary artery disease)    a. 03/2019 [facility]): EF 63%, ant ischemia; b. 08/2019 Cath: LM nl, LAD 67m RCA 70p (iFR nl @ 0.97), 50p. EF 50-55%-->Med Rx; c. 06/2020 Cor CTA: Nl FFRs throughout cor tree.   Osteoporosis    Panic attack    PTSD (post-traumatic stress disorder)    PVC's (premature ventricular contractions)    a. 05/2021 Zio: 20.6% PVC burden. 13.4 secs of NSVT-->amio added; b. 06/2021 Zio: 5.8% PVC burden. Rare supraventrciular ectopy.   Rectal fistula    Sleep apnea    a. Prev wore CPAP but lost insurance and hasn't been using since (now has insurance).   Wears dentures    full upper   Past Surgical History:  Procedure Laterality Date   CARDIAC CATHETERIZATION      COLONOSCOPY  2014   COLONOSCOPY WITH PROPOFOL N/A 10/01/2016   Procedure: COLONOSCOPY WITH PROPOFOL;  Surgeon: DLucilla Lame MD;  [facility]  Service: Endoscopy;  Laterality: N/A;   CORONARY PRESSURE WIRE/FFR WITH 3D MAPPING N/A 09/11/2019   Procedure: Coronary Pressure Wire/FFR w/3D Mapping;  Surgeon: ENelva Bush MD;  [facility];  Service: Cardiovascular;  Laterality: N/A;   CYSTOCELE REPAIR N/A 12/17/2016   Procedure: ANTERIOR REPAIR (CYSTOCELE);  Surgeon: MBrayton Mars MD;  Location: [facility];  Service: Gynecology;  Laterality: N/A;   ESOPHAGOGASTRODUODENOSCOPY (EGD) WITH PROPOFOL N/A 08/11/2019   Procedure: ESOPHAGOGASTRODUODENOSCOPY (EGD) WITH PROPOFOL;  Surgeon: WLucilla Lame MD;  Location: [facility];  Service: Endoscopy;  Laterality: N/A;   POLYPECTOMY  10/01/2016   Procedure: POLYPECTOMY;  Surgeon: DLucilla Lame MD;  [facility]  Service: Endoscopy;;   RECTAL SURGERY  06/28/2015   Rectal prolapse, laparoscopic [facility]xy [facility]  RIGHT HEART CATH N/A 03/21/2021   Procedure: RIGHT HEART CATH;  Surgeon: ENelva Bush MD;  [facility];  Service: Cardiovascular;  Laterality: N/A;   RIGHT/LEFT HEART CATH AND CORONARY ANGIOGRAPHY Bilateral 09/11/2019   Procedure: RIGHT/LEFT HEART CATH AND CORONARY ANGIOGRAPHY;  Surgeon: GMinna Merritts MD;  [facility];  Service: Cardiovascular;  Laterality: Bilateral;   TOTAL ABDOMINAL HYSTERECTOMY W/ BILATERAL SALPINGOOPHORECTOMY  TUBAL LIGATION     VAGINAL HYSTERECTOMY Bilateral 12/17/2016   Procedure: HYSTERECTOMY VAGINAL WITH BILATERAL SALPINGO OOPHERECTOMY;  Surgeon: Brayton Mars, MD;  Location: [facility];  Service: Gynecology;  Laterality: Bilateral;    Allergies  Allergies  Allergen Reactions   Entresto [Sacubitril-Valsartan]     Cough    Lisinopril Cough    History of Present Illness     70 year old female with above past medical history including nonischemic cardiomyopathy, chronic HFrEF, PVCs, nonobstructive CAD, diabetes, hyperlipidemia, obesity, sleep apnea, anxiety, PTSD, COPD, and GERD.  She was diagnosed with cardiomyopathy in November 2020 when echo showed an EF of 25 to 30%.  Subsequent diagnostic catheterization in November 2020 showed a 70% stenosis in the proximal RCA with normal iFR (0.97), and medical therapy was recommended.  Of note, EF was 50 to 55% by ventriculography but subsequent echo in August 2021 again showed LV dysfunction with an EF of 30 to 35%.  Diuresis has historically been limited by acute kidney injury.  Most recent echo in March 2022 showed an EF of 30 to 35% with global hypokinesis.  In the setting of ongoing dyspnea, she underwent right heart catheterization in May 2022 showing elevated right heart pressures with low cardiac output and index at 2.8 and 1.3 respectively.  She was referred to heart failure clinic in [place] and noted to have frequent PVCs.  Subsequent Zio monitoring revealed a 20.6% PVC burden and she was placed on amiodarone therapy.  She was referred to electrophysiology and repeat monitoring in September 2022 showed a 5.8% PVC burden with recommendation to continue amiodarone 2 mg daily.  In September, patient also underwent cardiac MRI which showed an EF of 40% with marked septal-lateral dyssynchrony and septal hypokinesis.  There were also small, discrete areas of mid wall LGE in the apical lateral and apical septal walls with question of history of myocarditis.  From a heart failure standpoint, Ms. Trinidad reports that she has done well.  She does have chronic, stable dyspnea on exertion with minimal activity but denies chest pain, palpitations, PND, orthopnea, dizziness, syncope, edema, or early satiety.  Her weight has been stable at home and in fact, she is down 23 pounds since April.  We were contacted by her primary care provider  because she reported that she discontinued amiodarone, torsemide, and potassium, and that she was scheduled for follow-up.  In discussing it today, she says that she missed a few doses of torsemide and potassium but has otherwise been taking.  Regarding amiodarone, she had read that it could result in liver disease and so she has been off of it for a month.  She does have frequent PVCs on her ECG today.  She is interested in resuming.  Home Medications    Current Outpatient Medications  Medication Sig Dispense Refill   aspirin 81 MG tablet Take 81 mg by mouth daily.      atorvastatin (LIPITOR) 10 MG tablet Take 1 tablet (10 mg total) by mouth at bedtime. 90 tablet 2   blood glucose meter kit and supplies Dispense based on patient and insurance preference. Check fasting blood sugar once daily. (FOR ICD E11.69). 1 each 0   dapagliflozin propanediol (FARXIGA) 10 MG TABS tablet Take 1 tablet (10 mg total) by mouth daily before breakfast. 30 tablet 2   digoxin (LANOXIN) 0.125 MG tablet Take 1 tablet (0.125 mg total) by mouth daily. 30 tablet 11   Dulaglutide (TRULICITY) 1.5 HQ/4.6NG SOPN Inject 1.5 mg into the  skin once a week. 6 mL 1   fluconazole (DIFLUCAN) 150 MG tablet Take 1 tablet (150 mg total) by mouth daily. Take 1 pill today, and then one pill on Friday 2 tablet 0   gabapentin (NEURONTIN) 300 MG capsule Take 1 capsule (300 mg total) by mouth 3 (three) times daily. 90 capsule 0   Lancets (ONETOUCH DELICA PLUS [id number]) MISC 1 Units by Other route daily. 100 each 1   metFORMIN (GLUCOPHAGE) 1000 MG tablet Take 1 tablet (1,000 mg total) by mouth 2 (two) times daily with a meal. 180 tablet 4   metoprolol succinate (TOPROL-XL) 25 MG 24 hr tablet TAKE 1 AND 1/2 TABLETS BY  MOUTH DAILY 135 tablet 3   ONETOUCH ULTRA test strip 1 each by Other route daily. 100 each 1   pantoprazole (PROTONIX) 40 MG tablet Take 1 tablet (40 mg total) by mouth daily. 90 tablet 4   polyethylene glycol powder  (GLYCOLAX/MIRALAX) 17 GM/SCOOP powder Take 17 g by mouth daily.     potassium chloride (KLOR-CON) 10 MEQ tablet Take 1 tablet (10 mEq total) by mouth as directed. Take with your Torsemide. 90 tablet 3   QUEtiapine (SEROQUEL) 200 MG tablet Take 1 tablet (200 mg total) by mouth daily. Patient takes at bed time. 90 tablet 1   spironolactone (ALDACTONE) 25 MG tablet Take 25 mg by mouth daily.     torsemide (DEMADEX) 20 MG tablet Take 2 tablets (40 mg total) by mouth daily. Take Potassium with Torsemide. 60 tablet 11   valsartan (DIOVAN) 40 MG tablet Take 0.5 tablets (20 mg total) by mouth 2 (two) times daily. 30 tablet 11   amiodarone (PACERONE) 200 MG tablet Take 1 tablet (200 mg total) by mouth daily. (Patient not taking: Reported on 08/25/2021) 30 tablet 5   No current facility-administered medications for this visit.     Review of Systems    Chronic, stable dyspnea exertion.  She denies chest pain, palpitations, PND, orthopnea, dizziness, syncope, edema, or early satiety.  All other systems reviewed and are otherwise negative except as noted above.   Physical Exam    VS:  BP 120/64 (BP Location: Left Arm, Patient Position: Sitting, Cuff Size: Large)   Pulse 86   Ht _0  (1.549 m)   Wt 225 lb (102.1 kg)   SpO2 95%   BMI 42.51 kg/m  , BMI Body mass index is 42.51 kg/m.     GEN: Obese, in no acute distress. HEENT: normal. Neck: Obese, difficult to gauge JVP., carotid bruits, or masses. Cardiac: Distant heart sounds with frequent ectopy-irregular.  No murmurs, rubs, or gallops. No clubbing, cyanosis, edema.  Radials/PT 2+ and equal bilaterally.  Respiratory:  Respirations regular and unlabored, clear to auscultation bilaterally. GI: Obese, soft, nontender, nondistended, BS + x 4. MS: no deformity or atrophy. Skin: warm and dry, no rash.  Very dry skin on her lower legs and feet with thick calluses over her heels. Neuro:  Strength and sensation are intact. Psych: Normal  affect.  Accessory Clinical Findings    ECG personally reviewed by me today -  sinus rhythm, 86, frequent PVCs, left bundle branch block.  - no acute changes.  Lab Results  Component Value Date   WBC 6.9 06/13/2021   HGB 14.3 06/13/2021   HCT 44.8 06/13/2021   MCV 85 06/13/2021   PLT 225 06/13/2021   Lab Results  Component Value Date   CREATININE 0.84 08/25/2021   BUN 14 08/25/2021   NA  139 08/25/2021   K 4.8 08/25/2021   CL 102 08/25/2021   CO2 21 08/25/2021   Lab Results  Component Value Date   ALT 90 (H) 08/25/2021   AST 73 (H) 08/25/2021   ALKPHOS 164 (H) 08/25/2021   BILITOT 0.7 08/25/2021   Lab Results  Component Value Date   CHOL 74 (L) 08/25/2021   HDL 29 (L) 08/25/2021   LDLCALC 14 08/25/2021   TRIG 198 (H) 08/25/2021   CHOLHDL 2.6 08/25/2021    Lab Results  Component Value Date   HGBA1C 9.9 (H) 08/25/2021    Assessment & Plan    1.  Nonischemic cardiomyopathy/chronic heart failure with reduced ejection fraction: Most recent echo in March 2022 with an EF of 30 to 35%.  Cardiac MRI in September 2022 with 40% with marked septal-lateral dyssynchrony and septal disease.  There were also small discrete areas of mid wall LGE in the apical lateral and apical septal walls with question of history of myocarditis.  She is euvolemic on examination today and notes that she has been doing well at home.  She remains on beta-blocker, ARB, spironolactone, Farxiga, and digoxin.  Of note, she recently discontinued amiodarone on her own and has frequent PVCs on ECG today.  We discussed this at length and she will resume.  See below.  Recent basic metabolic panel with stable electrolytes and renal function.  2.  PVCs: Monitoring in August of this year showed a 20.6% PVC burden, prompting initiation of amiodarone.  On amiodarone, burden dropped to 5.8% by repeat monitoring in late September/early October.  About a month ago, she stopped taking amiodarone due to concerns related to  hepatic side effects.  She has a history of fatty liver disease with mild LFT elevations dating back to early 2021.  AST and ALT of 73 and 90 respectively on November 4.  TSH was normal September 28.  We discussed resumption of amiodarone 20 mg daily with continued close monitoring of LFTs versus remaining off of it and EP reevaluation for PVC ablation.  At this time, her preference is to go back on amiodarone.  She is heart failure follow-up in early January and can plan for repeat complete metabolic panel at that time.  Continue beta-blocker.  3.  Nonobstructive CAD: Nonobstructive disease by catheterization 2020 and coronary CTA in September 2021.  No chest pain.  Chronic, stable dyspnea on exertion.  She remains on aspirin, statin, and beta-blocker therapy.  4.  Hyperlipidemia: LDL of 14 on atorvastatin therapy.  As noted above, history of abnormal LFTs.  Continue to follow closely.  5.  Type 2 diabetes mellitus: A1c 9.9 November 4.  Management per primary care.  She is on [place], Trulicity, and metformin.  Dry skin and thick calluses on her feet.  She plans to follow-up with podiatry.  6.  Disposition: Patient has follow-up with heart failure clinic in [place] in early January.  We will arrange for EP follow-up in approximately 3 months.  Murray Hodgkins, NP 09/07/2021, 3:28 PM

## 2021-09-12 ENCOUNTER — Ambulatory Visit: Payer: Medicare Other | Admitting: Podiatry

## 2021-09-13 ENCOUNTER — Ambulatory Visit: Payer: Medicare Other | Admitting: Nurse Practitioner

## 2021-09-18 ENCOUNTER — Other Ambulatory Visit: Payer: Self-pay | Admitting: Nurse Practitioner

## 2021-09-19 ENCOUNTER — Ambulatory Visit (INDEPENDENT_AMBULATORY_CARE_PROVIDER_SITE_OTHER): Payer: Medicare Other | Admitting: Podiatry

## 2021-09-19 ENCOUNTER — Other Ambulatory Visit: Payer: Self-pay

## 2021-09-19 DIAGNOSIS — E119 Type 2 diabetes mellitus without complications: Secondary | ICD-10-CM

## 2021-09-19 DIAGNOSIS — L84 Corns and callosities: Secondary | ICD-10-CM | POA: Diagnosis not present

## 2021-09-19 NOTE — Progress Notes (Signed)
Acute Office Visit  Subjective:    Patient ID: Kim Buchanan, female    DOB: 1951-01-04, 70 y.o.   MRN: [id number]  Chief Complaint  Patient presents with   Urinary Tract Infection    Pt states she has been having vaginal itching and discharge for the last week     HPI Patient is in today for vaginal itching.   VAGINAL ITCHING  Duration: started again last week. Did improve after she was seen 2 weeks ago. Discharge description:  none   Pruritus: yes Dysuria: no Malodorous: no Urinary frequency: no Fevers: no Abdominal pain: no  Recent antibiotic use: no Context: worse and previous yeast infections  Treatments attempted: none   Past Medical History:  Diagnosis Date   Anxiety    Arthritis    lower back   COPD (chronic obstructive pulmonary disease) ([place])    Depression    Diabetes mellitus without complication (HCC)    GERD (gastroesophageal reflux disease)    Hyperlipidemia    Morbid obesity ([place])    NICM (nonischemic cardiomyopathy) ([place])    a. 03/2019 Echo: Nl LVEF; b. 09/04/2019 Echo: EF 25-30%; c. 09/11/2019 Cath: nonobs LAD/RCA dzs. EF 50-55%; c. 12/2020 Echo: EF 30-35%, glob HK. Mild LVH. Nl RVSP. Mild MR; d. 02/2021 RHC: Elev RH pressures. CO/CI 2.8/1.3; e. 06/2021 cMRI: EF 40%, marked sept-lat dyssynch & septal HK. Small, discrete areas of mid-wall LGE in apical lateral and apical septal walls - ? h/o myocarditis.   Nonobstructive CAD (coronary artery disease)    a. 03/2019 MV [facility]): EF 63%, ant ischemia; b. 08/2019 Cath: LM nl, LAD 49m RCA 70p (iFR nl @ 0.97), 50p. EF 50-55%-->Med Rx; c. 06/2020 Cor CTA: Nl FFRs throughout cor tree.   Osteoporosis    Panic attack    PTSD (post-traumatic stress disorder)    PVC's (premature ventricular contractions)    a. 05/2021 Zio: 20.6% PVC burden. 13.4 secs of NSVT-->amio added; b. 06/2021 Zio: 5.8% PVC burden. Rare supraventrciular ectopy.   Rectal fistula    Sleep apnea    a. Prev wore CPAP but lost insurance and hasn't  been using since (now has insurance).   Wears dentures    full upper    Past Surgical History:  Procedure Laterality Date   CARDIAC CATHETERIZATION     COLONOSCOPY  2014   COLONOSCOPY WITH PROPOFOL N/A 10/01/2016   Procedure: COLONOSCOPY WITH PROPOFOL;  Surgeon: DLucilla Lame MD;  [place]  Service: Endoscopy;  Laterality: N/A;   CORONARY PRESSURE WIRE/FFR WITH 3D MAPPING N/A 09/11/2019   Procedure: Coronary Pressure Wire/FFR w/3D Mapping;  Surgeon: ENelva Bush MD;  [facility];  Service: Cardiovascular;  Laterality: N/A;   CYSTOCELE REPAIR N/A 12/17/2016   Procedure: ANTERIOR REPAIR (CYSTOCELE);  Surgeon: MBrayton Mars MD;  Location: [facility];  Service: Gynecology;  Laterality: N/A;   ESOPHAGOGASTRODUODENOSCOPY (EGD) WITH PROPOFOL N/A 08/11/2019   Procedure: ESOPHAGOGASTRODUODENOSCOPY (EGD) WITH PROPOFOL;  Surgeon: WLucilla Lame MD;  Location: [facility];  Service: Endoscopy;  Laterality: N/A;   POLYPECTOMY  10/01/2016   Procedure: POLYPECTOMY;  Surgeon: DLucilla Lame MD;  [place] [facility] [place]  Service: Endoscopy;;   RECTAL SURGERY  06/28/2015   Rectal prolapse[facility] [place]  RIGHT HEART CATH N/A 03/21/2021   Procedure: RIGHT HEART CATH;  Surgeon: ENelva Bush MD;  [place];  Service: Cardiovascular;  Laterality: N/A;   RIGHT/LEFT HEART CATH AND CORONARY ANGIOGRAPHY  Bilateral 09/11/2019   Procedure: RIGHT/LEFT HEART CATH AND CORONARY ANGIOGRAPHY;  Surgeon: [name], MD;  Location: [place] [facility];  Service: Cardiovascular;  Laterality: Bilateral;   TOTAL ABDOMINAL HYSTERECTOMY W/ BILATERAL SALPINGOOPHORECTOMY     TUBAL LIGATION     VAGINAL HYSTERECTOMY Bilateral 12/17/2016   Procedure: HYSTERECTOMY VAGINAL WITH BILATERAL SALPINGO OOPHERECTOMY;  Surgeon: Brayton Mars, MD;  Location: [facility];  Service: Gynecology;  Laterality: Bilateral;     Family History  Problem Relation Age of Onset   Diabetes Mother    Diabetes Sister    Cancer Sister    Diabetes Brother    Lymphoma Brother    Heart disease Father    Hypertension Father    Diabetes Sister    Diabetes Brother    Lymphoma Brother    Heart disease Brother    Healthy Sister    Breast cancer Neg Hx    Ovarian cancer Neg Hx    Colon cancer Neg Hx     Social History   Socioeconomic History   Marital status: Married    Spouse name: Paediatric nurse   Number of children: 1   Years of education: Not on file   Highest education level: 9th grade  Occupational History    Employer: DISABLED    Comment: for panic attacks  Tobacco Use   Smoking status: Former    Packs/day: 1.50    Years: 50.00    Pack years: 75.00    Types: Cigarettes    Quit date: 11/29/2018    Years since quitting: 2.8   Smokeless tobacco: Never   Tobacco comments:    started at age 75  Vaping Use   Vaping Use: Never used  Substance and Sexual Activity   Alcohol use: No    Alcohol/week: 0.0 standard drinks   Drug use: No   Sexual activity: Not Currently  Other Topics Concern   Not on file  Social History Narrative   Pt had one son, who died when was 55 YO in a MVA.   Social Determinants of Health   Financial Resource Strain: Low Risk    Difficulty of Paying Living Expenses: Not hard at all  Food Insecurity: No Food Insecurity   Worried About Charity fundraiser in the Last Year: Never true   [place] in the Last Year: Never true  Transportation Needs: No Transportation Needs   Lack of Transportation (Medical): No   Lack of Transportation (Non-Medical): No  Physical Activity: Inactive   Days of Exercise per Week: 0 days   Minutes of Exercise per Session: 0 min  Stress: No Stress Concern Present   Feeling of Stress : Not at all  Social Connections: Not on file  Intimate Partner Violence: Not on file    Outpatient Medications Prior to Visit  Medication Sig Dispense Refill    amiodarone (PACERONE) 200 MG tablet Take 1 tablet (200 mg total) by mouth daily. 30 tablet 5   aspirin 81 MG tablet Take 81 mg by mouth daily.      atorvastatin (LIPITOR) 10 MG tablet Take 1 tablet (10 mg total) by mouth at bedtime. 90 tablet 2   blood glucose meter kit and supplies Dispense based on patient and insurance preference. Check fasting blood sugar once daily. (FOR ICD E11.69). 1 each 0   digoxin (LANOXIN) 0.125 MG tablet Take 1 tablet (0.125 mg total) by mouth daily. 30 tablet 11   Dulaglutide (TRULICITY) 1.5 ZO/1.0RU SOPN Inject 1.5  mg into the skin once a week. 6 mL 1   gabapentin (NEURONTIN) 300 MG capsule Take 1 capsule (300 mg total) by mouth 3 (three) times daily. 90 capsule 0   Lancets (ONETOUCH DELICA PLUS [id number]) MISC TEST ONCE DAILY 100 each 3   metFORMIN (GLUCOPHAGE) 1000 MG tablet Take 1 tablet (1,000 mg total) by mouth 2 (two) times daily with a meal. 180 tablet 4   metoprolol succinate (TOPROL-XL) 25 MG 24 hr tablet TAKE 1 AND 1/2 TABLETS BY  MOUTH DAILY 135 tablet 3   ONETOUCH ULTRA test strip TEST ONCE DAILY 100 strip 3   pantoprazole (PROTONIX) 40 MG tablet Take 1 tablet (40 mg total) by mouth daily. 90 tablet 4   polyethylene glycol powder (GLYCOLAX/MIRALAX) 17 GM/SCOOP powder Take 17 g by mouth daily.     potassium chloride (KLOR-CON) 10 MEQ tablet Take 1 tablet (10 mEq total) by mouth as directed. Take with your Torsemide. 90 tablet 3   QUEtiapine (SEROQUEL) 200 MG tablet Take 1 tablet (200 mg total) by mouth daily. Patient takes at bed time. 90 tablet 1   spironolactone (ALDACTONE) 25 MG tablet TAKE ONE-HALF TABLET BY  MOUTH DAILY 45 tablet 3   torsemide (DEMADEX) 20 MG tablet Take 2 tablets (40 mg total) by mouth daily. Take Potassium with Torsemide. 60 tablet 11   valsartan (DIOVAN) 40 MG tablet Take 0.5 tablets (20 mg total) by mouth 2 (two) times daily. 30 tablet 11   dapagliflozin propanediol (FARXIGA) 10 MG TABS tablet Take 1 tablet (10 mg total) by mouth  daily before breakfast. 30 tablet 2   fluconazole (DIFLUCAN) 150 MG tablet Take 1 tablet (150 mg total) by mouth daily. Take 1 pill today, and then one pill on Friday 2 tablet 0   No facility-administered medications prior to visit.    Allergies  Allergen Reactions   Entresto [Sacubitril-Valsartan]     Cough    Lisinopril Cough    Review of Systems  Constitutional: Negative.   Respiratory: Negative.    Cardiovascular: Negative.   Gastrointestinal: Negative.   Genitourinary:  Negative for dysuria, flank pain, hematuria and urgency.       Vaginal itching      Objective:    Physical Exam Vitals and nursing note reviewed.  Constitutional:      General: She is not in acute distress.    Appearance: Normal appearance.  HENT:     Head: Normocephalic.  Eyes:     Conjunctiva/sclera: Conjunctivae normal.  Cardiovascular:     Rate and Rhythm: Normal rate and regular rhythm.     Pulses: Normal pulses.     Heart sounds: Normal heart sounds.  Pulmonary:     Effort: Pulmonary effort is normal.     Breath sounds: Normal breath sounds.  Abdominal:     Palpations: Abdomen is soft.     Tenderness: There is no abdominal tenderness. There is no right CVA tenderness or left CVA tenderness.  Musculoskeletal:     Cervical back: Normal range of motion.  Skin:    General: Skin is warm.  Neurological:     General: No focal deficit present.     Mental Status: She is alert and oriented to person, place, and time.  Psychiatric:        Mood and Affect: Mood normal.        Behavior: Behavior normal.        Thought Content: Thought content normal.  Judgment: Judgment normal.    BP 129/68   Pulse 97   Temp 98.4 F (36.9 C) (Oral)   Wt 227 lb 7.2 oz (103.2 kg)   SpO2 98%   BMI 42.98 kg/m  Wt Readings from Last 3 Encounters:  09/20/21 227 lb 7.2 oz (103.2 kg)  09/07/21 225 lb (102.1 kg)  08/29/21 228 lb 9.6 oz (103.7 kg)    Health Maintenance Due  Topic Date Due   Zoster  Vaccines- Shingrix (1 of 2) Never done   COVID-19 Vaccine (4 - Booster for Moderna series) 12/24/2020    There are no preventive care reminders to display for this patient.   Lab Results  Component Value Date   TSH 4.100 07/19/2021   Lab Results  Component Value Date   WBC 6.9 06/13/2021   HGB 14.3 06/13/2021   HCT 44.8 06/13/2021   MCV 85 06/13/2021   PLT 225 06/13/2021   Lab Results  Component Value Date   NA 139 08/25/2021   K 4.8 08/25/2021   CO2 21 08/25/2021   GLUCOSE 199 (H) 08/25/2021   BUN 14 08/25/2021   CREATININE 0.84 08/25/2021   BILITOT 0.7 08/25/2021   ALKPHOS 164 (H) 08/25/2021   AST 73 (H) 08/25/2021   ALT 90 (H) 08/25/2021   PROT 6.9 08/25/2021   ALBUMIN 4.6 08/25/2021   CALCIUM 9.0 08/25/2021   ANIONGAP 10 06/14/2021   EGFR 76 08/25/2021   Lab Results  Component Value Date   CHOL 74 (L) 08/25/2021   Lab Results  Component Value Date   HDL 29 (L) 08/25/2021   Lab Results  Component Value Date   LDLCALC 14 08/25/2021   Lab Results  Component Value Date   TRIG 198 (H) 08/25/2021   Lab Results  Component Value Date   CHOLHDL 2.6 08/25/2021   Lab Results  Component Value Date   HGBA1C 9.9 (H) 08/25/2021       Assessment & Plan:   Problem List Items Addressed This Visit       Endocrine   Type 2 diabetes mellitus with peripheral neuropathy ([place])    Will stop Farxiga due to 3 recent yeast infections that clear with Diflucan and return shortly after.  Have already increased Trulicity to 83m. Will follow up in 2 months for reevaluation.       Other Visit Diagnoses     Yeast vaginitis    -  Primary   Will treat with Diflucan. Will also stop FWilder Gladedue to 3 recent yeast infections. Will follow up in 2 months and recheck A1c at that time. FU if symptoms worse   Relevant Medications   fluconazole (DIFLUCAN) 150 MG tablet   Dysuria       Relevant Orders   Urinalysis, Routine w reflex microscopic   WET PREP FOR TRICH, YEAST, CLUE         Meds ordered this encounter  Medications   fluconazole (DIFLUCAN) 150 MG tablet    Sig: Take 1 tablet (150 mg total) by mouth daily. Take 1 pill today, and then one pill on Saturday    Dispense:  2 tablet    Refill:  0    Order Specific Question:   Supervising Provider    Answer:   JValerie Roys[[[id number]      KJon Billings NP

## 2021-09-19 NOTE — Telephone Encounter (Signed)
Requested Prescriptions  Pending Prescriptions Disp Refills  . ONETOUCH ULTRA test strip [employer] Med Name: OneTouch Ultra In Vitro Strip] 100 strip 3    Sig: TEST ONCE DAILY     Endocrinology: Diabetes - Testing Supplies Passed - 09/18/2021  7:13 AM      Passed - Valid encounter within last 12 months    Recent Outpatient Visits          3 weeks ago Yeast vaginitis   [place], Lauren A, NP   3 weeks ago NICM (nonischemic cardiomyopathy) ([place])   [place], NP   1 month ago Yeast infection   [place], [name], NP   3 months ago NICM (nonischemic cardiomyopathy) [facility])   [facility] [name], NP   6 months ago HFrEF (heart failure with reduced ejection fraction) [facility])   [facility] [name], NP      Future Appointments            Tomorrow [name], NP [facility], [place]   In 2 months [name], NP [facility], [place]   In 2 months  [facility], [place]   In 2 months [name], MD [facility], [facility]           . Lancets (ONETOUCH DELICA PLUS [id number]) [place] [Pharmacy Med Name: OneTouch Delica Plus [id number]] 100 each 3    Sig: TEST ONCE DAILY     Endocrinology: Diabetes - Testing Supplies Passed - 09/18/2021  7:13 AM      Passed - Valid encounter within last 12 months    Recent Outpatient Visits          3 weeks ago Yeast vaginitis   [place], Lauren A, NP   3 weeks ago NICM (nonischemic cardiomyopathy) ([place])   [place], NP   1 month ago Yeast infection   [place], [name], NP   3 months ago NICM (nonischemic cardiomyopathy) [facility])   [facility] [name], NP   6 months ago HFrEF (heart failure with reduced ejection fraction) [facility])   [facility] [name], NP      Future Appointments            Tomorrow [name], NP [facility], [place]   In 2 months [name], NP [facility], [place]   In 2 months  [facility], [place]   In 2 months Quentin Ore, Hilton Cork, MD [facility], [place]

## 2021-09-19 NOTE — Telephone Encounter (Signed)
Requested medications are due for refill today NO  Requested medications are on the active medication list  Dose inconsistent with current med list.  Last refill 07/30/21  Last visit 08/29/21  Future visit scheduled 09/20/21  Notes to clinic Historical Provider, dose inconsistent with current med list,please assess. Requested Prescriptions  Pending Prescriptions Disp Refills   spironolactone (ALDACTONE) 25 MG tablet [Pharmacy Med Name: Spironolactone 25 MG Oral Tablet] 45 tablet 3    Sig: TAKE ONE-HALF TABLET BY  MOUTH DAILY     Cardiovascular: Diuretics - Aldosterone Antagonist Passed - 09/18/2021  7:13 AM      Passed - Cr in normal range and within 360 days    Creat  Date Value Ref Range Status  01/01/2018 0.82 0.50 - 0.99 mg/dL Final    Comment:    For patients >48 years of age, the reference limit for Creatinine is approximately 13% higher for people identified as African-American. .    Creatinine, Ser  Date Value Ref Range Status  08/25/2021 0.84 0.57 - 1.00 mg/dL Final          Passed - K in normal range and within 360 days    Potassium  Date Value Ref Range Status  08/25/2021 4.8 3.5 - 5.2 mmol/L Final  02/24/2013 4.1 3.5 - 5.1 mmol/L Final          Passed - Na in normal range and within 360 days    Sodium  Date Value Ref Range Status  08/25/2021 139 134 - 144 mmol/L Final  02/24/2013 143 136 - 145 mmol/L Final          Passed - Last BP in normal range    BP Readings from Last 1 Encounters:  09/07/21 120/64          Passed - Valid encounter within last 6 months    Recent Outpatient Visits           3 weeks ago Yeast vaginitis   [place], Lauren A, NP   3 weeks ago NICM (nonischemic cardiomyopathy) ([place])   [place], Karen, NP   1 month ago Yeast infection   [place], [name], NP   3 months ago NICM (nonischemic cardiomyopathy) [facility])   [facility] [name], NP   6 months ago HFrEF (heart failure with reduced ejection fraction) [facility])   [facility] [name], NP       Future Appointments             Tomorrow [name], NP [facility], [place]   In 2 months [name], NP [facility], [place]   In 2 months  [facility], [place]   In 2 months Quentin Ore, Hilton Cork, MD [facility], [place]

## 2021-09-19 NOTE — Progress Notes (Signed)
   HPI: 70 y.o. female presenting today as a new patient due to concerns of diabetes mellitus.  Patient states that she does have a history of diabetes mellitus and she has been developing symptomatic calluses to the right heel for several months.  She is concerned with the heel cracking and it is very dry and hard.  She would like to have it evaluated today.  Currently she has not done anything for treatment  Past Medical History:  Diagnosis Date   Anxiety    Arthritis    lower back   COPD (chronic obstructive pulmonary disease) ([place])    Depression    Diabetes mellitus without complication (HCC)    GERD (gastroesophageal reflux disease)    Hyperlipidemia    Morbid obesity ([place])    NICM (nonischemic cardiomyopathy) ([place])    a. 03/2019 Echo: Nl LVEF; b. 09/04/2019 Echo: EF 25-30%; c. 09/11/2019 Cath: nonobs LAD/RCA dzs. EF 50-55%; c. 12/2020 Echo: EF 30-35%, glob HK. Mild LVH. Nl RVSP. Mild MR; d. 02/2021 RHC: Elev RH pressures. CO/CI 2.8/1.3; e. 06/2021 cMRI: EF 40%, marked sept-lat dyssynch & septal HK. Small, discrete areas of mid-wall LGE in apical lateral and apical septal walls - ? h/o myocarditis.   Nonobstructive CAD (coronary artery disease)    a. 03/2019 MV [facility]): EF 63%, ant ischemia; b. 08/2019 Cath: LM nl, LAD 18m, RCA 70p (iFR nl @ 0.97), 50p. EF 50-55%-->Med Rx; c. 06/2020 Cor CTA: Nl FFRs throughout cor tree.   Osteoporosis    Panic attack    PTSD (post-traumatic stress disorder)    PVC's (premature ventricular contractions)    a. 05/2021 Zio: 20.6% PVC burden. 13.4 secs of NSVT-->amio added; b. 06/2021 Zio: 5.8% PVC burden. Rare supraventrciular ectopy.   Rectal fistula    Sleep apnea    a. Prev wore CPAP but lost insurance and hasn't been using since (now has insurance).   Wears dentures    full upper     Physical Exam: General: The patient is alert and oriented x3 in no acute distress.  Dermatology: Skin is warm, dry and supple bilateral lower extremities. Negative for  open lesions or macerations.  Hyperkeratotic dry callused skin noted to the right plantar heel.  Vascular: Palpable pedal pulses bilaterally. No edema or erythema noted. Capillary refill within normal limits.  Neurological: Sensation intact  Musculoskeletal Exam: No pedal deformities noted  Assessment: 1.  Diabetes mellitus; uncomplicated 2.  Benign hyperkeratotic callus right plantar heel   Plan of Care:  1. Patient evaluated.  Comprehensive diabetic foot exam performed today 2.  Advised that the patient does not go barefoot.  Recommend good supportive shoes even around the house.  Patient admits to going barefoot throughout the day 3.  Return to clinic annually     Edrick Kins, DPM [facility]  Dr. Edrick Kins, DPM    [address], [place] [address]                Office [phone]  Fax [phone]

## 2021-09-20 ENCOUNTER — Encounter: Payer: Self-pay | Admitting: Nurse Practitioner

## 2021-09-20 ENCOUNTER — Ambulatory Visit (INDEPENDENT_AMBULATORY_CARE_PROVIDER_SITE_OTHER): Payer: Medicare Other | Admitting: Nurse Practitioner

## 2021-09-20 ENCOUNTER — Other Ambulatory Visit: Payer: Self-pay

## 2021-09-20 VITALS — BP 129/68 | HR 97 | Temp 98.4°F | Wt 227.4 lb

## 2021-09-20 DIAGNOSIS — E1142 Type 2 diabetes mellitus with diabetic polyneuropathy: Secondary | ICD-10-CM

## 2021-09-20 DIAGNOSIS — B3731 Acute candidiasis of vulva and vagina: Secondary | ICD-10-CM | POA: Diagnosis not present

## 2021-09-20 DIAGNOSIS — R300 Dysuria: Secondary | ICD-10-CM

## 2021-09-20 LAB — WET PREP FOR TRICH, YEAST, CLUE
Clue Cell Exam: NEGATIVE
Trichomonas Exam: NEGATIVE
Yeast Exam: POSITIVE — AB

## 2021-09-20 LAB — URINALYSIS, ROUTINE W REFLEX MICROSCOPIC
Bilirubin, UA: NEGATIVE
Leukocytes,UA: NEGATIVE
Nitrite, UA: NEGATIVE
Protein,UA: NEGATIVE
RBC, UA: NEGATIVE
Specific Gravity, UA: 1.02 (ref 1.005–1.030)
Urobilinogen, Ur: 1 mg/dL (ref 0.2–1.0)
pH, UA: 5.5 (ref 5.0–7.5)

## 2021-09-20 MED ORDER — FLUCONAZOLE 150 MG PO TABS: 150.0000 mg | ORAL_TABLET | Freq: Every day | ORAL | 0 refills | Status: DC

## 2021-09-20 NOTE — Progress Notes (Signed)
Results discussed with patient during visit.

## 2021-09-20 NOTE — Assessment & Plan Note (Signed)
Will stop [name] due to 3 recent yeast infections that clear with Diflucan and return shortly after.  Have already increased Trulicity to 3mg . Will follow up in 2 months for reevaluation.

## 2021-09-27 ENCOUNTER — Telehealth: Payer: Self-pay

## 2021-09-27 NOTE — Chronic Care Management (AMB) (Signed)
Chronic Care Management Pharmacy Assistant   Name: Kim Buchanan  MRN: [id number] DOB: 1951-04-05  Reason for Encounter: Disease State General   Recent office visits:  09/20/21-Kim Mathis Dad, NP (PCP) Seen for urinay tract infection. Will stop Kim Buchanan due to 3 recent yeast infections. Increased Trulicity to 29m. Start on Diflucan 150 mg. Follow up in 2 months. 08/29/21-Kim AAvelina Laine NP. Seen due to dysuria and vaginal itching. Start on Diflucan 150 mg. Return if symptoms worsen or fail to improve. 08/25/21-Kim HMathis Dad NP (PCP) General follow up visit. Labs ordered. She has stopped her Amiodarone, torsemide, and potassium. Discussed the importance of medication compliance with patient today. Follow up in 3 months. 07/26/21-Kim HMathis Dad NP (PCP) Seen for vaginitis. Start on Diflucan 150 mg. Flu vaccine given. Return if symptoms worsen or fail to improve. 06/13/21-Kim HMathis Dad NP (PCP) General follow up visit. Labs ordered.  Will increase Trulicity to 15.2WU Increase Seroquel to 205mdaily. Follow up in 3 months.  Recent consult visits:  09/19/21-Kim Buchanan (Podiatry) New patient visit. 09/07/21-Kim Buchanan) General follow up visit. Follow up in 3 months. 07/19/21-Kim Buchanan (Cardiology) Presented for a follow up visit. Labs ordered. EKG ordered. Follow up in 3 months. 03/29/21-[facility] Follow up visit. Increase torsemide to 20 mg daily. Decrease metoprolol succinate to 25 mg daily. Refer to advanced heart failure clinic for expedited evaluation. Follow up in 1 month.  Hospital visits:  None in previous 6 months  Medications: Outpatient Encounter Medications as of 09/27/2021  Medication Sig   amiodarone (PACERONE) 200 MG tablet Take 1 tablet (200 mg total) by mouth daily.   aspirin 81 MG tablet Take 81 mg by mouth daily.    atorvastatin (LIPITOR) 10 MG tablet Take 1 tablet (10 mg total) by  mouth at bedtime.   blood glucose meter kit and supplies Dispense based on patient and insurance preference. Check fasting blood sugar once daily. (FOR ICD E11.69).   digoxin (LANOXIN) 0.125 MG tablet Take 1 tablet (0.125 mg total) by mouth daily.   Dulaglutide (TRULICITY) 1.5 MGXL/2.4MWOPN Inject 1.5 mg into the skin once a week.   fluconazole (DIFLUCAN) 150 MG tablet Take 1 tablet (150 mg total) by mouth daily. Take 1 pill today, and then one pill on Saturday   gabapentin (NEURONTIN) 300 MG capsule Take 1 capsule (300 mg total) by mouth 3 (three) times daily.   Lancets (ONETOUCH DELICA PLUS [id number] TEST ONCE DAILY   metFORMIN (GLUCOPHAGE) 1000 MG tablet Take 1 tablet (1,000 mg total) by mouth 2 (two) times daily with a meal.   metoprolol succinate (TOPROL-XL) 25 MG 24 hr tablet TAKE 1 AND 1/2 TABLETS BY  MOUTH DAILY   ONETOUCH ULTRA test strip TEST ONCE DAILY   pantoprazole (PROTONIX) 40 MG tablet Take 1 tablet (40 mg total) by mouth daily.   polyethylene glycol powder (GLYCOLAX/MIRALAX) 17 GM/SCOOP powder Take 17 g by mouth daily.   potassium chloride (KLOR-CON) 10 MEQ tablet Take 1 tablet (10 mEq total) by mouth as directed. Take with your Torsemide.   QUEtiapine (SEROQUEL) 200 MG tablet Take 1 tablet (200 mg total) by mouth daily. Patient takes at bed time.   spironolactone (ALDACTONE) 25 MG tablet TAKE ONE-HALF TABLET BY  MOUTH DAILY   torsemide (DEMADEX) 20 MG tablet Take 2 tablets (40 mg total) by mouth daily. Take Potassium with Torsemide.   valsartan (DIOVAN) 40 MG tablet Take 0.5 tablets (20 mg total) by mouth  2 (two) times daily.   No facility-administered encounter medications on file as of 09/27/2021.   Have you had any problems recently with your health? Patient states she has no problems with her health.  Have you had any problems with your pharmacy? Patient states she has no problems with pharmacy at this time.  What issues or side effects are you having with your  medications? Patient was was having yeast infection which her provider though is could be due to her taking Kim Buchanan, but patient did stop taking it and still is experiencing yeast symptoms.  What would you like me to pass along to Kim Buchanan,CPP for them to help you with?  Patient states she did receive the patient assistance application for Trulicity and completed the form and dropped off the form at [facility] on 10/09/21.   What can we do to take care of you better? Patient states there is nothing at this time.  Care Gaps: Zoster Vaccines- Shingrix:Never done COVID-19 Vaccine:Overdue since 12/24/2020  Star Rating Drugs: Atorvastatin 10 mg Last MWNUUV:25/36/64 90 DS Trulicity 1.5 mg Last QIHKVQ:25/95/63 28 DS Valsartan 40 mg Last filled:09/18/21 30 DS Metformin 1000 Last filled:09/20/21 90 DS  Kim Buchanan, [place]

## 2021-09-27 NOTE — Addendum Note (Signed)
Addended by: Raelene Bott, Kestrel Mis L on: 09/27/2021 10:41 AM   Modules accepted: Orders

## 2021-10-09 ENCOUNTER — Telehealth: Payer: Self-pay | Admitting: Nurse Practitioner

## 2021-10-09 NOTE — Telephone Encounter (Signed)
Form signed.

## 2021-10-09 NOTE — Telephone Encounter (Signed)
Mr. Anfinson dropped off paper work (patience assistance) that was signed by Karolee Stamps for [facility]. Paper work was placed in [employer]

## 2021-10-24 ENCOUNTER — Ambulatory Visit: Payer: Medicare Other | Admitting: Physician Assistant

## 2021-10-25 ENCOUNTER — Encounter (HOSPITAL_COMMUNITY): Payer: Medicare Other | Admitting: Cardiology

## 2021-11-06 ENCOUNTER — Telehealth: Payer: Self-pay

## 2021-11-09 NOTE — Progress Notes (Signed)
[facility] cares for update on the missing information on the Trulicity Patient assistance application. They stated to verify the patient insurance information and if it was Medicare Part D. I have given them the insurance information and the application has been approved today 11/09/21-10/21/22. Updated on PAP panel dates of approval.  Corrie Mckusick, [place]

## 2021-11-14 ENCOUNTER — Other Ambulatory Visit: Payer: Self-pay | Admitting: Nurse Practitioner

## 2021-11-15 NOTE — Telephone Encounter (Signed)
Requested Prescriptions  Pending Prescriptions Disp Refills   metFORMIN (GLUCOPHAGE) 1000 MG tablet [Pharmacy Med Name: metFORMIN HCl 1000 MG Oral Tablet] 180 tablet 0    Sig: TAKE 1 TABLET BY MOUTH  TWICE DAILY WITH MEALS     Endocrinology:  Diabetes - Biguanides Failed - 11/14/2021 11:12 PM      Failed - HBA1C is between 0 and 7.9 and within 180 days    HB A1C (BAYER DCA - WAIVED)  Date Value Ref Range Status  01/23/2021 8.6 (H) <7.0 % Final    Comment:                                          Diabetic Adult            <7.0                                       Healthy Adult        4.3 - 5.7                                                           (DCCT/NGSP) American Diabetes Association's Summary of Glycemic Recommendations for Adults with Diabetes: Hemoglobin A1c <7.0%. More stringent glycemic goals (A1c <6.0%) may further reduce complications at the cost of increased risk of hypoglycemia.    Hgb A1c MFr Bld  Date Value Ref Range Status  08/25/2021 9.9 (H) 4.8 - 5.6 % Final    Comment:             Prediabetes: 5.7 - 6.4          Diabetes: >6.4          Glycemic control for adults with diabetes: <7.0          Passed - Cr in normal range and within 360 days    Creat  Date Value Ref Range Status  01/01/2018 0.82 0.50 - 0.99 mg/dL Final    Comment:    For patients >67 years of age, the reference limit for Creatinine is approximately 13% higher for people identified as African-American. .    Creatinine, Ser  Date Value Ref Range Status  08/25/2021 0.84 0.57 - 1.00 mg/dL Final         Passed - eGFR in normal range and within 360 days    GFR, Est African American  Date Value Ref Range Status  01/01/2018 88 > OR = 60 mL/min/1.73m Final   GFR calc Af Amer  Date Value Ref Range Status  10/17/2020 79 >59 mL/min/1.73 Final    Comment:    **In accordance with recommendations from the NKF-ASN Task force,**   [facility] is in the process of updating its eGFR calculation  to the   2021 CKD-EPI creatinine equation that estimates kidney function   without a race variable.    GFR, Est Non African American  Date Value Ref Range Status  01/01/2018 76 > OR = 60 mL/min/1.714mFinal   GFR, Estimated  Date Value Ref Range Status  06/14/2021 >60 >60 mL/min Final    Comment:    (NOTE) Calculated using  the CKD-EPI Creatinine Equation (2021)    eGFR  Date Value Ref Range Status  08/25/2021 76 >59 mL/min/1.73 Final         Passed - Valid encounter within last 6 months    Recent Outpatient Visits          1 month ago Yeast vaginitis   [place], NP   2 months ago Yeast vaginitis   [place], Lauren A, NP   2 months ago NICM (nonischemic cardiomyopathy) ([place])   [place], Karen, NP   3 months ago Yeast infection   [place], [name], NP   5 months ago NICM (nonischemic cardiomyopathy) [facility])   [facility] [name], NP      Future Appointments            In 1 week [name], NP [facility], [place]   In 2 weeks  [facility], [place]   In 4 weeks [name], MD [facility], [place]

## 2021-11-24 ENCOUNTER — Encounter: Payer: Self-pay | Admitting: Nurse Practitioner

## 2021-11-24 ENCOUNTER — Ambulatory Visit (INDEPENDENT_AMBULATORY_CARE_PROVIDER_SITE_OTHER): Payer: Medicare Other | Admitting: Nurse Practitioner

## 2021-11-24 ENCOUNTER — Other Ambulatory Visit: Payer: Self-pay

## 2021-11-24 ENCOUNTER — Telehealth: Payer: Self-pay

## 2021-11-24 VITALS — BP 126/78 | HR 92 | Temp 98.7°F | Ht 61.0 in | Wt 219.4 lb

## 2021-11-24 DIAGNOSIS — E1169 Type 2 diabetes mellitus with other specified complication: Secondary | ICD-10-CM

## 2021-11-24 DIAGNOSIS — I25118 Atherosclerotic heart disease of native coronary artery with other forms of angina pectoris: Secondary | ICD-10-CM

## 2021-11-24 DIAGNOSIS — E1142 Type 2 diabetes mellitus with diabetic polyneuropathy: Secondary | ICD-10-CM

## 2021-11-24 DIAGNOSIS — R748 Abnormal levels of other serum enzymes: Secondary | ICD-10-CM

## 2021-11-24 DIAGNOSIS — D649 Anemia, unspecified: Secondary | ICD-10-CM | POA: Diagnosis not present

## 2021-11-24 DIAGNOSIS — Z0000 Encounter for general adult medical examination without abnormal findings: Secondary | ICD-10-CM

## 2021-11-24 DIAGNOSIS — I700 Atherosclerosis of aorta: Secondary | ICD-10-CM | POA: Diagnosis not present

## 2021-11-24 DIAGNOSIS — I428 Other cardiomyopathies: Secondary | ICD-10-CM

## 2021-11-24 DIAGNOSIS — Z1211 Encounter for screening for malignant neoplasm of colon: Secondary | ICD-10-CM | POA: Diagnosis not present

## 2021-11-24 DIAGNOSIS — I152 Hypertension secondary to endocrine disorders: Secondary | ICD-10-CM | POA: Diagnosis not present

## 2021-11-24 DIAGNOSIS — F331 Major depressive disorder, recurrent, moderate: Secondary | ICD-10-CM | POA: Insufficient documentation

## 2021-11-24 DIAGNOSIS — J410 Simple chronic bronchitis: Secondary | ICD-10-CM | POA: Diagnosis not present

## 2021-11-24 DIAGNOSIS — I5020 Unspecified systolic (congestive) heart failure: Secondary | ICD-10-CM | POA: Diagnosis not present

## 2021-11-24 DIAGNOSIS — E1159 Type 2 diabetes mellitus with other circulatory complications: Secondary | ICD-10-CM | POA: Diagnosis not present

## 2021-11-24 DIAGNOSIS — Z1231 Encounter for screening mammogram for malignant neoplasm of breast: Secondary | ICD-10-CM

## 2021-11-24 DIAGNOSIS — E785 Hyperlipidemia, unspecified: Secondary | ICD-10-CM | POA: Diagnosis not present

## 2021-11-24 NOTE — Assessment & Plan Note (Signed)
Chronic.  Controlled.  Continue with current medication regimen.  Labs ordered today.  Return to clinic in 3 months for reevaluation.  Call sooner if concerns arise.   

## 2021-11-24 NOTE — Telephone Encounter (Signed)
CALLED PATIENT NO ANSWER LEFT VOICEMAIL FOR A CALL BACK ? ?

## 2021-11-24 NOTE — Assessment & Plan Note (Signed)
Patient has recently lost 9lbs.  Praised for weight loss. Encouraged to continue with weight loss efforts.

## 2021-11-24 NOTE — Assessment & Plan Note (Signed)
Chronic. Uncontrolled.  Sugars are running 200-300.  On Metformin and Trulicity 1.5mg .  Patient receives this through PAP. A1c ordered today. Will make recommendations based on labs results.  Follow up in 3 months.  Call sooner if concerns arise.

## 2021-11-24 NOTE — Assessment & Plan Note (Signed)
Chronic. Ongoing.  Patient restarted medications after seeing HF clinic. Continues to follow up with Cardiology.  Continue per their recommendations.  Continue with ASA and Atorvastatin. 

## 2021-11-24 NOTE — Assessment & Plan Note (Signed)
Labs ordered today.  Will make recommendations based on lab results. ?

## 2021-11-24 NOTE — Progress Notes (Signed)
BP 126/78    Pulse 92    Temp 98.7 F (37.1 C) (Oral)    Ht '5\' 1"'  (1.549 m)    Wt 219 lb 7.2 oz (99.5 kg)    SpO2 94%    BMI 41.46 kg/m    Subjective:    Patient ID: Kim Buchanan, female    DOB: Jul 29, 1951, 71 y.o.   MRN: [id number]  HPI: Kim Buchanan is a 71 y.o. female presenting on 11/24/2021 for comprehensive medical examination. Current medical complaints include:none  She currently lives with: Menopausal Symptoms: no   HYPERTENSION / HYPERLIPIDEMIA Patient states she stopped taking her her toresemde, potassium and amiodarone.  She was not told to stop these by Cardiology. She stopped then on her own. Satisfied with current treatment? yes Duration of hypertension: years BP monitoring frequency: a few times a month BP range: 120-125/80 BP medication side effects: no Past BP meds: metoprolol and valsartan Duration of hyperlipidemia: years Cholesterol medication side effects: no Cholesterol supplements: none Past cholesterol medications: atorvastain (lipitor) Medication compliance: excellent compliance Aspirin: yes Recent stressors: no Recurrent headaches: no Visual changes: no Palpitations: no Dyspnea: SOB Chest pain: no Lower extremity edema: no Dizzy/lightheaded: no  DIABETES Hypoglycemic episodes:no Polydipsia/polyuria: no Visual disturbance: no Chest pain: no Paresthesias: no Glucose Monitoring: sometimes  Accucheck frequency: once in a while  Fasting glucose: 200s  Post prandial:  Evening:  Before meals: Taking Insulin?: no  Long acting insulin:  Short acting insulin: Blood Pressure Monitoring: a few times a month Retinal Examination: Up to Date Foot Exam: Up to Date Diabetic Education: Not Completed Pneumovax: Up to Date Influenza: Up to Date Aspirin: yes  HEART FAILURE Patient states she is seeing the cardiologist in [place] and has an appt this month.  She has restarted her medications per the Cardiologist.  Denies concerns at visit  today.   COPD COPD status: controlled Satisfied with current treatment?: yes Oxygen use: no Dyspnea frequency:  Cough frequency:  Rescue inhaler frequency:   Limitation of activity: yes Productive cough:  Last Spirometry:  Pneumovax: Up to Date Influenza: Up to Date Depression Screen done today and results listed below:  Depression screen [facility] 2/9 11/24/2021 08/25/2021 06/13/2021 01/23/2021 12/02/2020  Decreased Interest 0 0 0 0 0  Down, Depressed, Hopeless 0 0 0 0 0  PHQ - 2 Score 0 0 0 0 0  Altered sleeping 1 0 1 0 0  Tired, decreased energy 1 0 '1 3 3  ' Change in appetite 0 '3 1 1 3  ' Feeling bad or failure about yourself  0 0 0 0 0  Trouble concentrating 0 0 0 0 0  Moving slowly or fidgety/restless 0 0 0 0 0  Suicidal thoughts 0 0 0 0 0  PHQ-9 Score '2 3 3 4 6  ' Difficult doing work/chores Not difficult at all Not difficult at all Not difficult at all Not difficult at all Not difficult at all  Some recent data might be hidden    The patient does not have a history of falls. I did complete a risk assessment for falls. A plan of care for falls was documented.   Past Medical History:  Past Medical History:  Diagnosis Date   Anxiety    Arthritis    lower back   COPD (chronic obstructive pulmonary disease) ([place])    Depression    Diabetes mellitus without complication (HCC)    GERD (gastroesophageal reflux disease)    Hyperlipidemia    Morbid obesity (  [place])    NICM (nonischemic cardiomyopathy) ([place])    a. 03/2019 Echo: Nl LVEF; b. 09/04/2019 Echo: EF 25-30%; c. 09/11/2019 Cath: nonobs LAD/RCA dzs. EF 50-55%; c. 12/2020 Echo: EF 30-35%, glob HK. Mild LVH. Nl RVSP. Mild MR; d. 02/2021 RHC: Elev RH pressures. CO/CI 2.8/1.3; e. 06/2021 cMRI: EF 40%, marked sept-lat dyssynch & septal HK. Small, discrete areas of mid-wall LGE in apical lateral and apical septal walls - ? h/o myocarditis.   Nonobstructive CAD (coronary artery disease)    a. 03/2019 [facility]): EF 63%, ant ischemia; b.  08/2019 Cath: LM nl, LAD 6m RCA 70p (iFR nl @ 0.97), 50p. EF 50-55%-->Med Rx; c. 06/2020 Cor CTA: Nl FFRs throughout cor tree.   Osteoporosis    Panic attack    PTSD (post-traumatic stress disorder)    PVC's (premature ventricular contractions)    a. 05/2021 Zio: 20.6% PVC burden. 13.4 secs of NSVT-->amio added; b. 06/2021 Zio: 5.8% PVC burden. Rare supraventrciular ectopy.   Rectal fistula    Sleep apnea    a. Prev wore CPAP but lost insurance and hasn't been using since (now has insurance).   Wears dentures    full upper    Surgical History:  Past Surgical History:  Procedure Laterality Date   CARDIAC CATHETERIZATION     COLONOSCOPY  2014   COLONOSCOPY WITH PROPOFOL N/A 10/01/2016   Procedure: COLONOSCOPY WITH PROPOFOL;  Surgeon: DLucilla Lame MD;  [facility]  Service: Endoscopy;  Laterality: N/A;   CORONARY PRESSURE WIRE/FFR WITH 3D MAPPING N/A 09/11/2019   Procedure: Coronary Pressure Wire/FFR w/3D Mapping;  Surgeon: ENelva Bush MD;  [facility];  Service: Cardiovascular;  Laterality: N/A;   CYSTOCELE REPAIR N/A 12/17/2016   Procedure: ANTERIOR REPAIR (CYSTOCELE);  Surgeon: MBrayton Mars MD;  Location: [facility];  Service: Gynecology;  Laterality: N/A;   ESOPHAGOGASTRODUODENOSCOPY (EGD) WITH PROPOFOL N/A 08/11/2019   Procedure: ESOPHAGOGASTRODUODENOSCOPY (EGD) WITH PROPOFOL;  Surgeon: WLucilla Lame MD;  Location: [facility];  Service: Endoscopy;  Laterality: N/A;   POLYPECTOMY  10/01/2016   Procedure: POLYPECTOMY;  Surgeon: DLucilla Lame MD;  [facility]  Service: Endoscopy;;   RECTAL SURGERY  06/28/2015   Rectal prolapse, [facility] [place]  RIGHT HEART CATH N/A 03/21/2021   Procedure: RIGHT HEART CATH;  Surgeon: ENelva Bush MD;  [facility];  Service: Cardiovascular;  Laterality: N/A;   RIGHT/LEFT HEART CATH AND CORONARY ANGIOGRAPHY  Bilateral 09/11/2019   Procedure: RIGHT/LEFT HEART CATH AND CORONARY ANGIOGRAPHY;  Surgeon: GMinna Merritts MD;  [facility];  Service: Cardiovascular;  Laterality: Bilateral;   TOTAL ABDOMINAL HYSTERECTOMY W/ BILATERAL SALPINGOOPHORECTOMY     TUBAL LIGATION     VAGINAL HYSTERECTOMY Bilateral 12/17/2016   Procedure: HYSTERECTOMY VAGINAL WITH BILATERAL SALPINGO OOPHERECTOMY;  Surgeon: MBrayton Mars MD;  Location: [facility];  Service: Gynecology;  Laterality: Bilateral;    Medications:  Current Outpatient Medications on File Prior to Visit  Medication Sig   aspirin 81 MG tablet Take 81 mg by mouth daily.    atorvastatin (LIPITOR) 10 MG tablet Take 1 tablet (10 mg total) by mouth at bedtime.   blood glucose meter kit and supplies Dispense based on patient and insurance preference. Check fasting blood sugar once daily. (FOR ICD E11.69).   digoxin (LANOXIN) 0.125 MG tablet Take 1 tablet (0.125 mg total) by mouth daily.   Dulaglutide (TRULICITY) 1.5 MTG/2.5WLSOPN Inject 1.5 mg into  the skin once a week.   gabapentin (NEURONTIN) 300 MG capsule Take 1 capsule (300 mg total) by mouth 3 (three) times daily.   Lancets (ONETOUCH DELICA PLUS [id number]) MISC TEST ONCE DAILY   metFORMIN (GLUCOPHAGE) 1000 MG tablet TAKE 1 TABLET BY MOUTH  TWICE DAILY WITH MEALS   metoprolol succinate (TOPROL-XL) 25 MG 24 hr tablet TAKE 1 AND 1/2 TABLETS BY  MOUTH DAILY   ONETOUCH ULTRA test strip TEST ONCE DAILY   pantoprazole (PROTONIX) 40 MG tablet Take 1 tablet (40 mg total) by mouth daily.   polyethylene glycol powder (GLYCOLAX/MIRALAX) 17 GM/SCOOP powder Take 17 g by mouth daily.   potassium chloride (KLOR-CON) 10 MEQ tablet Take 1 tablet (10 mEq total) by mouth as directed. Take with your Torsemide.   QUEtiapine (SEROQUEL) 200 MG tablet Take 1 tablet (200 mg total) by mouth daily. Patient takes at bed time.   spironolactone (ALDACTONE) 25 MG tablet TAKE ONE-HALF TABLET BY   MOUTH DAILY   torsemide (DEMADEX) 20 MG tablet Take 2 tablets (40 mg total) by mouth daily. Take Potassium with Torsemide.   valsartan (DIOVAN) 40 MG tablet Take 0.5 tablets (20 mg total) by mouth 2 (two) times daily.   amiodarone (PACERONE) 200 MG tablet Take 1 tablet (200 mg total) by mouth daily.   No current facility-administered medications on file prior to visit.    Allergies:  Allergies  Allergen Reactions   Entresto [Sacubitril-Valsartan]     Cough    Lisinopril Cough    Social History:  Social History   Socioeconomic History   Marital status: Married    Spouse name: Paediatric nurse   Number of children: 1   Years of education: Not on file   Highest education level: 9th grade  Occupational History    Employer: DISABLED    Comment: for panic attacks  Tobacco Use   Smoking status: Former    Packs/day: 1.50    Years: 50.00    Pack years: 75.00    Types: Cigarettes    Quit date: 11/29/2018    Years since quitting: 2.9   Smokeless tobacco: Never   Tobacco comments:    started at age 16  Vaping Use   Vaping Use: Never used  Substance and Sexual Activity   Alcohol use: No    Alcohol/week: 0.0 standard drinks   Drug use: No   Sexual activity: Not Currently  Other Topics Concern   Not on file  Social History Narrative   Pt had one son, who died when was 18 YO in a MVA.   Social Determinants of Health   Financial Resource Strain: Low Risk    Difficulty of Paying Living Expenses: Not hard at all  Food Insecurity: No Food Insecurity   Worried About Charity fundraiser in the Last Year: Never true   [place] in the Last Year: Never true  Transportation Needs: No Transportation Needs   Lack of Transportation (Medical): No   Lack of Transportation (Non-Medical): No  Physical Activity: Inactive   Days of Exercise per Week: 0 days   Minutes of Exercise per Session: 0 min  Stress: No Stress Concern Present   Feeling of Stress : Not at all   Social Connections: Not on file  Intimate Partner Violence: Not on file   Social History   Tobacco Use  Smoking Status Former   Packs/day: 1.50   Years: 50.00   Pack years: 75.00   Types: Cigarettes   Quit  date: 11/29/2018   Years since quitting: 2.9  Smokeless Tobacco Never  Tobacco Comments   started at age 84   Social History   Substance and Sexual Activity  Alcohol Use No   Alcohol/week: 0.0 standard drinks    Family History:  Family History  Problem Relation Age of Onset   Diabetes Mother    Diabetes Sister    Cancer Sister    Diabetes Brother    Lymphoma Brother    Heart disease Father    Hypertension Father    Diabetes Sister    Diabetes Brother    Lymphoma Brother    Heart disease Brother    Healthy Sister    Breast cancer Neg Hx    Ovarian cancer Neg Hx    Colon cancer Neg Hx     Past medical history, surgical history, medications, allergies, family history and social history reviewed with patient today and changes made to appropriate areas of the chart.   Review of Systems  HENT:         Denies vision changes.  Eyes:  Negative for blurred vision and double vision.  Respiratory:  Negative for shortness of breath.   Cardiovascular:  Negative for chest pain, palpitations and leg swelling.  Neurological:  Negative for dizziness, tingling and headaches.  Endo/Heme/Allergies:  Negative for polydipsia.       Denies Polyuria  Psychiatric/Behavioral:  Negative for depression and suicidal ideas.   All other ROS negative except what is listed above and in the HPI.      Objective:    BP 126/78    Pulse 92    Temp 98.7 F (37.1 C) (Oral)    Ht '5\' 1"'  (1.549 m)    Wt 219 lb 7.2 oz (99.5 kg)    SpO2 94%    BMI 41.46 kg/m   Wt Readings from Last 3 Encounters:  11/24/21 219 lb 7.2 oz (99.5 kg)  09/20/21 227 lb 7.2 oz (103.2 kg)  09/07/21 225 lb (102.1 kg)    Physical Exam Vitals and nursing note reviewed.  Constitutional:       General: She is awake. She is not in acute distress.    Appearance: She is well-developed. She is obese. She is not ill-appearing.  HENT:     Head: Normocephalic and atraumatic.     Right Ear: Hearing, tympanic membrane, ear canal and external ear normal. No drainage.     Left Ear: Hearing, tympanic membrane, ear canal and external ear normal. No drainage.     Nose: Nose normal.     Right Sinus: No maxillary sinus tenderness or frontal sinus tenderness.     Left Sinus: No maxillary sinus tenderness or frontal sinus tenderness.     Mouth/Throat:     Mouth: Mucous membranes are moist.     Pharynx: Oropharynx is clear. Uvula midline. No pharyngeal swelling, oropharyngeal exudate or posterior oropharyngeal erythema.  Eyes:     General: Lids are normal.        Right eye: No discharge.        Left eye: No discharge.     Extraocular Movements: Extraocular movements intact.     Conjunctiva/sclera: Conjunctivae normal.     Pupils: Pupils are equal, round, and reactive to light.     Visual Fields: Right eye visual fields normal and left eye visual fields normal.  Neck:     Thyroid: No thyromegaly.     Vascular: No carotid bruit.     Trachea: Trachea  normal.  Cardiovascular:     Rate and Rhythm: Normal rate and regular rhythm.     Heart sounds: Normal heart sounds. No murmur heard.   No gallop.  Pulmonary:     Effort: Pulmonary effort is normal. No accessory muscle usage or respiratory distress.     Breath sounds: Normal breath sounds.  Chest:  Breasts:    Right: Normal.     Left: Normal.  Abdominal:     General: Bowel sounds are normal.     Palpations: Abdomen is soft. There is no hepatomegaly or splenomegaly.     Tenderness: There is no abdominal tenderness.  Musculoskeletal:        General: Normal range of motion.     Cervical back: Normal range of motion and neck supple.     Right lower leg: No edema.     Left lower leg: No edema.  Lymphadenopathy:     Head:     Right side of  head: No submental, submandibular, tonsillar, preauricular or posterior auricular adenopathy.     Left side of head: No submental, submandibular, tonsillar, preauricular or posterior auricular adenopathy.     Cervical: No cervical adenopathy.     Upper Body:     Right upper body: No supraclavicular, axillary or pectoral adenopathy.     Left upper body: No supraclavicular, axillary or pectoral adenopathy.  Skin:    General: Skin is warm and dry.     Capillary Refill: Capillary refill takes less than 2 seconds.     Findings: No rash.  Neurological:     Mental Status: She is alert and oriented to person, place, and time.     Gait: Gait is intact.     Deep Tendon Reflexes: Reflexes are normal and symmetric.     Reflex Scores:      Brachioradialis reflexes are 2+ on the right side and 2+ on the left side.      Patellar reflexes are 2+ on the right side and 2+ on the left side. Psychiatric:        Attention and Perception: Attention normal.        Mood and Affect: Mood normal.        Speech: Speech normal.        Behavior: Behavior normal. Behavior is cooperative.        Thought Content: Thought content normal.        Judgment: Judgment normal.    Results for orders placed or performed in visit on 09/20/21  WET PREP FOR [place], YEAST, CLUE   Urine  Result Value Ref Range   Trichomonas Exam Negative Negative   Yeast Exam Positive (A) Negative   Clue Cell Exam Negative Negative  Urinalysis, Routine w reflex microscopic  Result Value Ref Range   Specific Gravity, UA 1.020 1.005 - 1.030   pH, UA 5.5 5.0 - 7.5   Color, UA Yellow Yellow   Appearance Ur Cloudy (A) Clear   Leukocytes,UA Negative Negative   Protein,UA Negative Negative/Trace   Glucose, UA 3+ (A) Negative   Ketones, UA Trace (A) Negative   RBC, UA Negative Negative   Bilirubin, UA Negative Negative   Urobilinogen, Ur 1.0 0.2 - 1.0 mg/dL   Nitrite, UA Negative Negative      Assessment & Plan:   Problem List Items  Addressed This Visit       Cardiovascular and Mediastinum   Hypertension associated with diabetes (HCC)    Chronic. Not well controlled.  Labs  ordered today.  If a1c is not at goal of 7 will increase Trulicity to 32m.  Doesn't check sugars regularly.  Follow up in 3 months for reevaluation.      Relevant Orders   Comprehensive metabolic panel   N[place] ([place]ischemic [place]) [place]    Chronic. Ongoing.  Patient restarted medications after seeing HF clinic. Continues to follow up with Cardiology.  Continue per their recommendations.  Continue with ASA and Atorvastatin.      CAD (coronary artery disease), native coronary artery    Chronic. Ongoing.  Patient restarted medications after seeing HF clinic. Continues to follow up with Cardiology.  Continue per their recommendations.  Continue with ASA and Atorvastatin.      Aortic atherosclerosis (HCC)    Chronic. Ongoing.  Patient restarted medications after seeing HF clinic. Continues to follow up with Cardiology.  Continue per their recommendations.  Continue with ASA and Atorvastatin.      HFrEF (heart failure with reduced ejection fraction) (HCC)    Chronic. Ongoing.  Patient restarted medications after seeing HF clinic. Continues to follow up with Cardiology.  Continue per their recommendations.  Continue with ASA and Atorvastatin.        Respiratory   COPD (chronic obstructive pulmonary disease) (HCC)    Chronic.  Controlled.  Continue with current medication regimen.  Labs ordered today.  Return to clinic in 3 months for reevaluation.  Call sooner if concerns arise.          Endocrine   Hyperlipidemia associated with type 2 diabetes mellitus (HCC)    Chronic.  Controlled.  Continue with current medication regimen on Atorvastatin 157m  Labs ordered today.  Return to clinic in 3 months for reevaluation.  Call sooner if concerns arise.        Relevant Orders   Lipid panel   Type 2 diabetes mellitus with peripheral  neuropathy (HCC)    Chronic. Uncontrolled.  Sugars are running 200-300.  On Metformin and Trulicity 1.0.9OB Patient receives this through PAP. A1c ordered today. Will make recommendations based on labs results.  Follow up in 3 months.  Call sooner if concerns arise.       Relevant Orders   HgB A1c     Other   [place]   Patient has recently lost 9lbs.  Praised for weight loss. Encouraged to continue with weight loss efforts.       Anemia    Labs ordered today.  Will make recommendations based on lab results.       Moderate episode of recurrent major depressive disorder (HCC)    Chronic.  Controlled.  Continue with current medication regimen on Seroquel 20079maily.  Labs ordered today.  Return to clinic in 3 months for reevaluation.  Call sooner if concerns arise.        Other Visit Diagnoses     Annual physical exam       Health maintenance reviewed during visit today. Labs ordered. Colonoscopy and Mammogram ordered during visit.    Relevant Orders   CBC with Differential/Platelet   Comprehensive metabolic panel   TSH   Urinalysis, Routine w reflex microscopic   Screening for colon cancer       Relevant Orders   Ambulatory referral to Gastroenterology   Encounter for screening mammogram for malignant neoplasm of breast       Relevant Orders   MM 3D SCREEN BREAST BILATERAL        Follow up  plan: Return in about 3 months (around 02/21/2022) for HTN, HLD, DM2 FU.   LABORATORY TESTING:  - Pap smear: not applicable  IMMUNIZATIONS:   - Tdap: Tetanus vaccination status reviewed: last tetanus booster within 10 years. - Influenza: Up to date - Pneumovax: Up to date - Prevnar: Up to date - COVID: Up to date - HPV: Not applicable - Shingrix vaccine:  Discussed at visit today  SCREENING: -Mammogram: Ordered today  - Colonoscopy: Ordered today  - Bone Density: Up to date  -Hearing Test: Not applicable  -Spirometry: Not applicable   PATIENT COUNSELING:    Advised to take 1 mg of folate supplement per day if capable of pregnancy.   Sexuality: Discussed sexually transmitted diseases, partner selection, use of condoms, avoidance of unintended pregnancy  and contraceptive alternatives.   Advised to avoid cigarette smoking.  I discussed with the patient that most people either abstain from alcohol or drink within safe limits (<=14/week and <=4 drinks/occasion for males, <=7/weeks and <= 3 drinks/occasion for females) and that the risk for alcohol disorders and other health effects rises proportionally with the number of drinks per week and how often a drinker exceeds daily limits.  Discussed cessation/primary prevention of drug use and availability of treatment for abuse.   Diet: Encouraged to adjust caloric intake to maintain  or achieve ideal body weight, to reduce intake of dietary saturated fat and total fat, to limit sodium intake by avoiding high sodium foods and not adding table salt, and to maintain adequate dietary potassium and calcium preferably from fresh fruits, vegetables, and low-fat dairy products.    stressed the importance of regular exercise  Injury prevention: Discussed safety belts, safety helmets, smoke detector, smoking near bedding or upholstery.   Dental health: Discussed importance of regular tooth brushing, flossing, and dental visits.    NEXT PREVENTATIVE PHYSICAL DUE IN 1 YEAR. Return in about 3 months (around 02/21/2022) for HTN, HLD, DM2 FU.

## 2021-11-24 NOTE — Assessment & Plan Note (Signed)
Chronic.  Controlled.  Continue with current medication regimen on Atorvastatin 10mg.  Labs ordered today.  Return to clinic in 3 months for reevaluation.  Call sooner if concerns arise.   

## 2021-11-24 NOTE — Assessment & Plan Note (Signed)
Chronic.  Controlled.  Continue with current medication regimen on Seroquel 200mg  daily.  Labs ordered today.  Return to clinic in 3 months for reevaluation.  Call sooner if concerns arise.

## 2021-11-24 NOTE — Assessment & Plan Note (Signed)
Chronic. Not well controlled.  Labs ordered today.  If a1c is not at goal of 7 will increase Trulicity to 3mg .  Doesn't check sugars regularly.  Follow up in 3 months for reevaluation.

## 2021-11-25 LAB — CBC WITH DIFFERENTIAL/PLATELET
Basophils Absolute: 0.1 10*3/uL (ref 0.0–0.2)
Basos: 1 %
EOS (ABSOLUTE): 0.1 10*3/uL (ref 0.0–0.4)
Eos: 1 %
Hematocrit: 48.6 % — ABNORMAL HIGH (ref 34.0–46.6)
Hemoglobin: 15.2 g/dL (ref 11.1–15.9)
Immature Grans (Abs): 0 10*3/uL (ref 0.0–0.1)
Immature Granulocytes: 0 %
Lymphocytes Absolute: 1.8 10*3/uL (ref 0.7–3.1)
Lymphs: 29 %
MCH: 28.1 pg (ref 26.6–33.0)
MCHC: 31.3 g/dL — ABNORMAL LOW (ref 31.5–35.7)
MCV: 90 fL (ref 79–97)
Monocytes Absolute: 0.4 10*3/uL (ref 0.1–0.9)
Monocytes: 7 %
Neutrophils Absolute: 3.8 10*3/uL (ref 1.4–7.0)
Neutrophils: 62 %
Platelets: 299 10*3/uL (ref 150–450)
RBC: 5.4 x10E6/uL — ABNORMAL HIGH (ref 3.77–5.28)
RDW: 14.4 % (ref 11.7–15.4)
WBC: 6.1 10*3/uL (ref 3.4–10.8)

## 2021-11-25 LAB — COMPREHENSIVE METABOLIC PANEL
ALT: 102 IU/L — ABNORMAL HIGH (ref 0–32)
AST: 95 IU/L — ABNORMAL HIGH (ref 0–40)
Albumin/Globulin Ratio: 1.7 (ref 1.2–2.2)
Albumin: 5.1 g/dL — ABNORMAL HIGH (ref 3.8–4.8)
Alkaline Phosphatase: 227 IU/L — ABNORMAL HIGH (ref 44–121)
BUN/Creatinine Ratio: 17 (ref 12–28)
BUN: 16 mg/dL (ref 8–27)
Bilirubin Total: 0.8 mg/dL (ref 0.0–1.2)
CO2: 20 mmol/L (ref 20–29)
Calcium: 9.6 mg/dL (ref 8.7–10.3)
Chloride: 92 mmol/L — ABNORMAL LOW (ref 96–106)
Creatinine, Ser: 0.95 mg/dL (ref 0.57–1.00)
Globulin, Total: 3 g/dL (ref 1.5–4.5)
Glucose: 412 mg/dL — ABNORMAL HIGH (ref 70–99)
Potassium: 4.8 mmol/L (ref 3.5–5.2)
Sodium: 135 mmol/L (ref 134–144)
Total Protein: 8.1 g/dL (ref 6.0–8.5)
eGFR: 65 mL/min/{1.73_m2} (ref >59)

## 2021-11-25 LAB — LIPID PANEL
Chol/HDL Ratio: 2.9 ratio (ref 0.0–4.4)
Cholesterol, Total: 84 mg/dL — ABNORMAL LOW (ref 100–199)
HDL: 29 mg/dL — ABNORMAL LOW (ref >39)
LDL Chol Calc (NIH): 18 mg/dL (ref 0–99)
Triglycerides: 243 mg/dL — ABNORMAL HIGH (ref 0–149)
VLDL Cholesterol Cal: 37 mg/dL (ref 5–40)

## 2021-11-25 LAB — HEMOGLOBIN A1C
Est. average glucose Bld gHb Est-mCnc: 289 mg/dL
Hgb A1c MFr Bld: 11.7 % — ABNORMAL HIGH (ref 4.8–5.6)

## 2021-11-25 LAB — TSH: TSH: 5.37 u[IU]/mL — ABNORMAL HIGH (ref 0.450–4.500)

## 2021-11-27 NOTE — Addendum Note (Signed)
Addended by: [name] on: 11/27/2021 09:12 AM   Modules accepted: Orders

## 2021-11-27 NOTE — Progress Notes (Signed)
Please let patient know that her lab work shows that her A1c is not well controlled at 11.7. I will reach out to the pharmacist to see if we can increase her dose of Trulicity.  Can you have her verify which dose she is currently on? 1.5mg  or 3mg ?  Her cholesterol is elevated from prior.  Recommend she decreased processed food and refined sugar intake.  I also recommend she decrease her carbohydrate intake.    Her thyroid is also elevated, I will recheck this at her next visit.   Her liver enzymes are elevated from prior.  I have ordered an abdominal Ultrasound to evaluate this further.  Please let me know if she has any questions.

## 2021-11-28 ENCOUNTER — Other Ambulatory Visit: Payer: Self-pay

## 2021-11-28 DIAGNOSIS — Z86010 Personal history of colonic polyps: Secondary | ICD-10-CM

## 2021-11-28 MED ORDER — NA SULFATE-K SULFATE-MG SULF 17.5-3.13-1.6 GM/177ML PO SOLN: 1.0000 | Freq: Once | ORAL | 0 refills | Status: AC

## 2021-11-28 NOTE — Progress Notes (Addendum)
Gastroenterology Pre-Procedure Review  Request Date: 01/02/2022 Requesting Physician: Dr. Allen Norris  PATIENT REVIEW QUESTIONS: The patient responded to the following health history questions as indicated:  (5 year recall)  1. Are you having any GI issues? no 2. Do you have a personal history of Polyps? yes (09/2016; polyps removed.) 3. Do you have a family history of Colon Cancer or Polyps? no 4. Diabetes Mellitus? yes (Type II) 5. Joint replacements in the past 12 months?no 6. Major health problems in the past 3 months?no 7. Any artificial heart valves, MVP, or defibrillator?no    MEDICATIONS & ALLERGIES:    Patient reports the following regarding taking any anticoagulation/antiplatelet therapy:   Plavix, Coumadin, Eliquis, Xarelto, Lovenox, Pradaxa, Brilinta, or Effient? no Aspirin? yes (81 mg)  Patient confirms/reports the following medications:  Current Outpatient Medications  Medication Sig Dispense Refill   amiodarone (PACERONE) 200 MG tablet Take 1 tablet (200 mg total) by mouth daily. 30 tablet 5   aspirin 81 MG tablet Take 81 mg by mouth daily.      atorvastatin (LIPITOR) 10 MG tablet Take 1 tablet (10 mg total) by mouth at bedtime. 90 tablet 2   blood glucose meter kit and supplies Dispense based on patient and insurance preference. Check fasting blood sugar once daily. (FOR ICD E11.69). 1 each 0   digoxin (LANOXIN) 0.125 MG tablet Take 1 tablet (0.125 mg total) by mouth daily. 30 tablet 11   Dulaglutide (TRULICITY) 1.5 DV/7.6HY SOPN Inject 1.5 mg into the skin once a week. 6 mL 1   gabapentin (NEURONTIN) 300 MG capsule Take 1 capsule (300 mg total) by mouth 3 (three) times daily. 90 capsule 0   Lancets (ONETOUCH DELICA PLUS [id number]) MISC TEST ONCE DAILY 100 each 3   metFORMIN (GLUCOPHAGE) 1000 MG tablet TAKE 1 TABLET BY MOUTH  TWICE DAILY WITH MEALS 180 tablet 0   metoprolol succinate (TOPROL-XL) 25 MG 24 hr tablet TAKE 1 AND 1/2 TABLETS BY  MOUTH DAILY 135 tablet 3    ONETOUCH ULTRA test strip TEST ONCE DAILY 100 strip 3   pantoprazole (PROTONIX) 40 MG tablet Take 1 tablet (40 mg total) by mouth daily. 90 tablet 4   polyethylene glycol powder (GLYCOLAX/MIRALAX) 17 GM/SCOOP powder Take 17 g by mouth daily.     potassium chloride (KLOR-CON) 10 MEQ tablet Take 1 tablet (10 mEq total) by mouth as directed. Take with your Torsemide. 90 tablet 3   QUEtiapine (SEROQUEL) 200 MG tablet Take 1 tablet (200 mg total) by mouth daily. Patient takes at bed time. 90 tablet 1   spironolactone (ALDACTONE) 25 MG tablet TAKE ONE-HALF TABLET BY  MOUTH DAILY 45 tablet 3   torsemide (DEMADEX) 20 MG tablet Take 2 tablets (40 mg total) by mouth daily. Take Potassium with Torsemide. 60 tablet 11   valsartan (DIOVAN) 40 MG tablet Take 0.5 tablets (20 mg total) by mouth 2 (two) times daily. 30 tablet 11   No current facility-administered medications for this visit.    Patient confirms/reports the following allergies:  Allergies  Allergen Reactions   Entresto [Sacubitril-Valsartan]     Cough    Lisinopril Cough    No orders of the defined types were placed in this encounter.   AUTHORIZATION INFORMATION Primary Insurance: 1D#: Group #:  Secondary Insurance: 1D#: Group #:  SCHEDULE INFORMATION: Date: 01/02/2022 Time: Location: [facility]

## 2021-12-04 ENCOUNTER — Telehealth: Payer: Medicare Other

## 2021-12-04 ENCOUNTER — Telehealth: Payer: Self-pay

## 2021-12-04 ENCOUNTER — Ambulatory Visit (INDEPENDENT_AMBULATORY_CARE_PROVIDER_SITE_OTHER): Payer: Medicare Other | Admitting: *Deleted

## 2021-12-04 DIAGNOSIS — Z0000 Encounter for general adult medical examination without abnormal findings: Secondary | ICD-10-CM

## 2021-12-04 NOTE — Patient Instructions (Addendum)
Kim Buchanan , Thank you for taking time to come for your Medicare Wellness Visit. I appreciate your ongoing commitment to your health goals. Please review the following plan we discussed and let me know if I can assist you in the future.   Screening recommendations/referrals: Colonoscopy: up to date Mammogram: Education provided Bone Density: Education provided Recommended yearly ophthalmology/optometry visit for glaucoma screening and checkup Recommended yearly dental visit for hygiene and checkup  Vaccinations: Influenza vaccine: up to date Pneumococcal vaccine: up to date Tdap vaccine: up to date Shingles vaccine: Education provided    Advanced directives: Education provided  Conditions/risks identified:   Next appointment: 02-21-2022 @ 8:40 [facility] 5 Years and Older, Female Preventive care refers to lifestyle choices and visits with your health care provider that can promote health and wellness. What does preventive care include? A yearly physical exam. This is also called an annual well check. Dental exams once or twice a year. Routine eye exams. Ask your health care provider how often you should have your eyes checked. Personal lifestyle choices, including: Daily care of your teeth and gums. Regular physical activity. Eating a healthy diet. Avoiding tobacco and drug use. Limiting alcohol use. Practicing safe sex. Taking low-dose aspirin every day. Taking vitamin and mineral supplements as recommended by your health care provider. What happens during an annual well check? The services and screenings done by your health care provider during your annual well check will depend on your age, overall health, lifestyle risk factors, and family history of disease. Counseling  Your health care provider may ask you questions about your: Alcohol use. Tobacco use. Drug use. Emotional well-being. Home and relationship well-being. Sexual activity. Eating  habits. History of falls. Memory and ability to understand (cognition). Work and work Statistician. Reproductive health. Screening  You may have the following tests or measurements: Height, weight, and BMI. Blood pressure. Lipid and cholesterol levels. These may be checked every 5 years, or more frequently if you are over 11 years old. Skin check. Lung cancer screening. You may have this screening every year starting at age 48 if you have a 30-pack-year history of smoking and currently smoke or have quit within the past 15 years. Fecal occult blood test (FOBT) of the stool. You may have this test every year starting at age 32. Flexible sigmoidoscopy or colonoscopy. You may have a sigmoidoscopy every 5 years or a colonoscopy every 10 years starting at age 72. Hepatitis C blood test. Hepatitis B blood test. Sexually transmitted disease (STD) testing. Diabetes screening. This is done by checking your blood sugar (glucose) after you have not eaten for a while (fasting). You may have this done every 1-3 years. Bone density scan. This is done to screen for osteoporosis. You may have this done starting at age 64. Mammogram. This may be done every 1-2 years. Talk to your health care provider about how often you should have regular mammograms. Talk with your health care provider about your test results, treatment options, and if necessary, the need for more tests. Vaccines  Your health care provider may recommend certain vaccines, such as: Influenza vaccine. This is recommended every year. Tetanus, diphtheria, and acellular pertussis (Tdap, Td) vaccine. You may need a Td booster every 10 years. Zoster vaccine. You may need this after age 39. Pneumococcal 13-valent conjugate (PCV13) vaccine. One dose is recommended after age 69. Pneumococcal polysaccharide (PPSV23) vaccine. One dose is recommended after age 66. Talk to your health care provider about which  screenings and vaccines you need and how  often you need them. This information is not intended to replace advice given to you by your health care provider. Make sure you discuss any questions you have with your health care provider. Document Released: 11/04/2015 Document Revised: 06/27/2016 Document Reviewed: 08/09/2015 Elsevier Interactive Patient Education  2017 [place] Prevention in the Home Falls can cause injuries. They can happen to people of all ages. There are many things you can do to make your home safe and to help prevent falls. What can I do on the outside of my home? Regularly fix the edges of walkways and driveways and fix any cracks. Remove anything that might make you trip as you walk through a door, such as a raised step or threshold. Trim any bushes or trees on the path to your home. Use bright outdoor lighting. Clear any walking paths of anything that might make someone trip, such as rocks or tools. Regularly check to see if handrails are loose or broken. Make sure that both sides of any steps have handrails. Any raised decks and porches should have guardrails on the edges. Have any leaves, snow, or ice cleared regularly. Use sand or salt on walking paths during winter. Clean up any spills in your garage right away. This includes oil or grease spills. What can I do in the bathroom? Use night lights. Install grab bars by the toilet and in the tub and shower. Do not use towel bars as grab bars. Use non-skid mats or decals in the tub or shower. If you need to sit down in the shower, use a plastic, non-slip stool. Keep the floor dry. Clean up any water that spills on the floor as soon as it happens. Remove soap buildup in the tub or shower regularly. Attach bath mats securely with double-sided non-slip rug tape. Do not have throw rugs and other things on the floor that can make you trip. What can I do in the bedroom? Use night lights. Make sure that you have a light by your bed that is easy to  reach. Do not use any sheets or blankets that are too big for your bed. They should not hang down onto the floor. Have a firm chair that has side arms. You can use this for support while you get dressed. Do not have throw rugs and other things on the floor that can make you trip. What can I do in the kitchen? Clean up any spills right away. Avoid walking on wet floors. Keep items that you use a lot in easy-to-reach places. If you need to reach something above you, use a strong step stool that has a grab bar. Keep electrical cords out of the way. Do not use floor polish or wax that makes floors slippery. If you must use wax, use non-skid floor wax. Do not have throw rugs and other things on the floor that can make you trip. What can I do with my stairs? Do not leave any items on the stairs. Make sure that there are handrails on both sides of the stairs and use them. Fix handrails that are broken or loose. Make sure that handrails are as long as the stairways. Check any carpeting to make sure that it is firmly attached to the stairs. Fix any carpet that is loose or worn. Avoid having throw rugs at the top or bottom of the stairs. If you do have throw rugs, attach them to the floor with carpet  tape. Make sure that you have a light switch at the top of the stairs and the bottom of the stairs. If you do not have them, ask someone to add them for you. What else can I do to help prevent falls? Wear shoes that: Do not have high heels. Have rubber bottoms. Are comfortable and fit you well. Are closed at the toe. Do not wear sandals. If you use a stepladder: Make sure that it is fully opened. Do not climb a closed stepladder. Make sure that both sides of the stepladder are locked into place. Ask someone to hold it for you, if possible. Clearly mark and make sure that you can see: Any grab bars or handrails. First and last steps. Where the edge of each step is. Use tools that help you move  around (mobility aids) if they are needed. These include: Canes. Walkers. Scooters. Crutches. Turn on the lights when you go into a dark area. Replace any light bulbs as soon as they burn out. Set up your furniture so you have a clear path. Avoid moving your furniture around. If any of your floors are uneven, fix them. If there are any pets around you, be aware of where they are. Review your medicines with your doctor. Some medicines can make you feel dizzy. This can increase your chance of falling. Ask your doctor what other things that you can do to help prevent falls. This information is not intended to replace advice given to you by your health care provider. Make sure you discuss any questions you have with your health care provider. Document Released: 08/04/2009 Document Revised: 03/15/2016 Document Reviewed: 11/12/2014 Elsevier Interactive Patient Education  2017 [employer].

## 2021-12-04 NOTE — Telephone Encounter (Signed)
°  Care Management   Follow Up Note   12/04/2021 Name: Kim Buchanan MRN: [id number] DOB: 07/29/1951   Referred by: [name], NP Reason for referral : Chronic Care Management   An unsuccessful telephone outreach was attempted today. The patient was referred to the case management team for assistance with care management and care coordination.   Follow Up Plan:  Left message with patient, there has been several unsuccessful attempts to reach patient. Hoping to review medication related needs with emphasis on diabetes management. Care guide notified to RS, preference for in person OV.  Madelin Rear, PharmD, [place] Clinical Pharmacist  [facility]  [phone]

## 2021-12-04 NOTE — Progress Notes (Incomplete)
Chronic Care Management Pharmacy Note  12/04/2021 Name:  Kim Buchanan MRN:  [id number] DOB:  Aug 28, 1951  Summary: ***  Subjective: Kim Buchanan is an 71 y.o. year old female who is a primary patient of [name], NP.  The CCM team was consulted for assistance with disease management and care coordination needs.    Engaged with patient by telephone for follow up visit in response to provider referral for pharmacy case management and/or care coordination services.   Consent to Services:  The patient was given information about Chronic Care Management services, agreed to services, and gave verbal consent prior to initiation of services.  Please see initial visit note for detailed documentation.   Patient Care Team: [name], NP as PCP - General (Nurse Practitioner) [name], MD as PCP - Cardiology (Cardiology) [name], MD as PCP - Electrophysiology (Cardiology) [name], OD as Consulting Physician (Optometry) [name], MD as Consulting Physician (Pulmonary Disease)  Hospital visits: None in previous 6 months  Objective:  Lab Results  Component Value Date   CREATININE 0.95 11/24/2021   CREATININE 0.84 08/25/2021   CREATININE 0.83 07/19/2021    Lab Results  Component Value Date   HGBA1C 11.7 (H) 11/24/2021   HGBA1C 9.9 (H) 08/25/2021   HGBA1C 10.0 (H) 06/13/2021   Last diabetic Eye exam:  Lab Results  Component Value Date/Time   HMDIABEYEEXA No Retinopathy 02/07/2021 09:12 AM    Last diabetic Foot exam: No results found for: HMDIABFOOTEX      Component Value Date/Time   CHOL 84 (L) 11/24/2021 0920   CHOL 74 (L) 08/25/2021 1027   CHOL 72 (L) 06/13/2021 0836   TRIG 243 (H) 11/24/2021 0920   TRIG 198 (H) 08/25/2021 1027   TRIG 186 (H) 06/13/2021 0836   HDL 29 (L) 11/24/2021 0920   HDL 29 (L) 08/25/2021 1027   HDL 30 (L) 06/13/2021 0836   CHOLHDL 2.9 11/24/2021 0920   CHOLHDL 1.7 01/01/2018 0000   VLDL  29 08/06/2016 0824   LDLCALC 18 11/24/2021 0920   LDLCALC 14 08/25/2021 1027   [place] 12 06/13/2021 0836   LDLCALC 29 01/01/2018 0000    Hepatic Function Latest Ref Rng & Units 11/24/2021 08/25/2021 07/19/2021  Total Protein 6.0 - 8.5 g/dL 8.1 6.9 7.1  Albumin 3.8 - 4.8 g/dL 5.1(H) 4.6 4.7  AST 0 - 40 IU/L 95(H) 73(H) 97(H)  ALT 0 - 32 IU/L 102(H) 90(H) 126(H)  Alk Phosphatase 44 - 121 IU/L 227(H) 164(H) 174(H)  Total Bilirubin 0.0 - 1.2 mg/dL 0.8 0.7 0.6    Lab Results  Component Value Date/Time   TSH 5.370 (H) 11/24/2021 09:20 AM   TSH 4.100 07/19/2021 01:52 PM   FREET4 1.31 07/19/2021 01:52 PM   FREET4 0.73 08/14/2019 09:48 AM    CBC Latest Ref Rng & Units 11/24/2021 06/13/2021 05/22/2021  WBC 3.4 - 10.8 x10E3/uL 6.1 6.9 8.5  Hemoglobin 11.1 - 15.9 g/dL 15.2 14.3 15.1(H)  Hematocrit 34.0 - 46.6 % 48.6(H) 44.8 49.2(H)  Platelets 150 - 450 x10E3/uL 299 225 353    No results found for: VD25OH  Clinical ASCVD:  The ASCVD Risk score (Arnett DK, et al., 2019) failed to calculate for the following reasons:   The systolic blood pressure is missing   The valid total cholesterol range is 130 to 320 mg/dL   Social History   Tobacco Use  Smoking Status Former   Packs/day: 1.50   Years: 50.00  Pack years: 75.00   Types: Cigarettes   Quit date: 11/29/2018   Years since quitting: 3.0  Smokeless Tobacco Never  Tobacco Comments   started at age 65   BP Readings from Last 3 Encounters:  11/24/21 126/78  09/20/21 129/68  09/07/21 120/64   Pulse Readings from Last 3 Encounters:  11/24/21 92  09/20/21 97  09/07/21 86   Wt Readings from Last 3 Encounters:  11/24/21 219 lb 7.2 oz (99.5 kg)  09/20/21 227 lb 7.2 oz (103.2 kg)  09/07/21 225 lb (102.1 kg)   BMI Readings from Last 3 Encounters:  11/24/21 41.46 kg/m  09/20/21 42.98 kg/m  09/07/21 42.51 kg/m    Assessment: Review of patient past medical history, allergies, medications, health status, including review of  consultants reports, laboratory and other test data, was performed as part of comprehensive evaluation and provision of chronic care management services.   SDOH:  (Social Determinants of Health) assessments and interventions performed: Yes***   CCM Care Plan  Allergies  Allergen Reactions   Entresto [Sacubitril-Valsartan]     Cough    Lisinopril Cough    Medications Reviewed Today     Reviewed by Suszanne Finch, LPN (Licensed Practical Nurse) on 12/04/21 at [phone]  Med List Status: <None>   Medication Order Taking? Sig Documenting Provider Last Dose Status Informant  amiodarone (PACERONE) 200 MG tablet [id number]  Take 1 tablet (200 mg total) by mouth daily. Theora Gianotti, NP  Expired 10/07/21 2359   aspirin 81 MG tablet [id number] Yes Take 81 mg by mouth daily.  [provider] Taking Active Self           Med Note Josiah Lobo, [facility]   Fri Jul 19, 2017 12:33 PM)    atorvastatin (LIPITOR) 10 MG tablet [id number] Yes Take 1 tablet (10 mg total) by mouth at bedtime. Rise Mu, PA-C Taking Active   blood glucose meter kit and supplies [id number] Yes Dispense based on patient and insurance preference. Check fasting blood sugar once daily. (FOR ICD E11.69). Virginia Crews, MD Taking Active Self  digoxin (LANOXIN) 0.125 MG tablet [id number] Yes Take 1 tablet (0.125 mg total) by mouth daily. Larey Dresser, MD Taking Active   Dulaglutide (TRULICITY) 1.5 GE/3.6OQ [place] [id number] Yes Inject 1.5 mg into the skin once a week. [name], NP Taking Active   gabapentin (NEURONTIN) 300 MG capsule [id number] Yes Take 1 capsule (300 mg total) by mouth 3 (three) times daily. [name], NP Taking Active Self  Lancets (ONETOUCH DELICA PLUS [id number]) [place] [id number] Yes TEST ONCE DAILY [name], NP Taking Active   metFORMIN (GLUCOPHAGE) 1000 MG tablet [id number] Yes TAKE 1 TABLET BY MOUTH  TWICE DAILY WITH MEALS [name], NP Taking Active    metoprolol succinate (TOPROL-XL) 25 MG 24 hr tablet [id number] Yes TAKE 1 AND 1/2 TABLETS BY  MOUTH DAILY [name], NP Taking Active   ONETOUCH ULTRA test strip [id number] Yes TEST ONCE DAILY [name], NP Taking Active   pantoprazole (PROTONIX) 40 MG tablet [id number] Yes Take 1 tablet (40 mg total) by mouth daily. Marnee Guarneri T, NP Taking Active Self  polyethylene glycol powder (GLYCOLAX/MIRALAX) 17 GM/SCOOP powder [id number] Yes Take 17 g by mouth daily. [provider] Taking Active Self  potassium chloride (KLOR-CON) 10 MEQ tablet [id number] Yes Take 1 tablet (10 mEq total) by mouth as directed. Take with your Torsemide. Rise Mu, PA-C Taking Active Self  QUEtiapine (SEROQUEL) 200 MG tablet [id number] Yes  Take 1 tablet (200 mg total) by mouth daily. Patient takes at bed time. [name], NP Taking Active   spironolactone (ALDACTONE) 25 MG tablet [id number] Yes TAKE ONE-HALF TABLET BY  MOUTH DAILY [name], NP Taking Active   torsemide (DEMADEX) 20 MG tablet [id number] Yes Take 2 tablets (40 mg total) by mouth daily. Take Potassium with Torsemide. Larey Dresser, MD Taking Active   valsartan (DIOVAN) 40 MG tablet [id number] Yes Take 0.5 tablets (20 mg total) by mouth 2 (two) times daily. Larey Dresser, MD Taking Active             Patient Active Problem List   Diagnosis Date Noted   Moderate episode of recurrent major depressive disorder ([place]) 11/24/2021   OSA (obstructive sleep apnea) 09/18/2020   Aortic atherosclerosis ([place]) 06/10/2020   Hepatic steatosis 06/10/2020   HFrEF (heart failure with reduced ejection fraction) ([place]) 06/10/2020   Osteoarthritis of left hip 03/16/2020   Type 2 diabetes mellitus with peripheral neuropathy ([place]) 12/11/2019   NICM (nonischemic cardiomyopathy) ([place]) 09/11/2019   CAD (coronary artery disease), native coronary artery 09/11/2019   Hypertension associated with diabetes ([place]) 06/05/2019    Morbid obesity ([place]) 10/03/2018   Anemia 10/03/2018   COPD (chronic obstructive pulmonary disease) ([place]) 03/13/2018   Insomnia 11/06/2017   Hyperlipidemia associated with type 2 diabetes mellitus ([place]) 07/19/2017   S/P vaginal hysterectomy 12/17/2016   Vaginal atrophy 10/31/2016   Hx of colonic polyps    DDD (degenerative disc disease), lumbar 02/21/2016   Low back pain 02/14/2016   GAD (generalized anxiety disorder) 06/06/2015   MDD (major depressive disorder) 06/06/2015   Gastroesophageal reflux disease 06/06/2015    Immunization History  Administered Date(s) Administered   Fluad Quad(high Dose 65+) 07/26/2021   Influenza, High Dose Seasonal PF 06/16/2019, 07/04/2020   Influenza, Seasonal, Injecte, Preservative Fre 08/18/2008, 07/14/2014   Influenza,inj,Quad PF,6+ Mos 09/25/2013, 08/05/2015, 08/02/2016, 07/24/2017   Moderna Sars-Covid-2 Vaccination 12/02/2019, 12/30/2019, 10/29/2020   Pneumococcal Conjugate-13 03/13/2018   Pneumococcal Polysaccharide-23 08/06/2019   Tdap 08/08/2009, 05/13/2014    Conditions to be addressed/monitored: HLD, HTN, T2DM, HFrEF  There are no care plans that you recently modified to display for this patient.  Current Barriers:  Unable to maintain control of T2DM  Pharmacist Clinical Goal(s):  Patient will verbalize ability to afford treatment regimen achieve adherence to monitoring guidelines and medication adherence to achieve therapeutic efficacy through collaboration with PharmD and provider.   Interventions: 1:1 collaboration with [name], NP regarding development and update of comprehensive plan of care as evidenced by provider attestation and co-signature Inter-disciplinary care team collaboration (see longitudinal plan of care) Comprehensive medication review performed; medication list updated in electronic medical record  Hypertension and Heart Failure (BP goal <130/80) -Controlled -CPAP***? -Current  treatment: *** -Medications previously tried: ***  -Current home readings: *** -Current dietary habits: *** -Current exercise habits: *** -{ACTIONS;DENIES/REPORTS:21021675::"Denies"} hypotensive/hypertensive symptoms -Educated on {CCM BP Counseling:25124} -Counseled to monitor BP at home ***, document, and provide log at future appointments  -{CCMPHARMDINTERVENTION:25122} Hyperlipidemia: (LDL goal < 55) -Controlled -Current treatment: Atorvastatin 10 mg once daily  -Medications previously tried: n/a  -Current dietary patterns: *** -Current exercise habits: *** -Educated on {CCM HLD Counseling:25126} -{CCMPHARMDINTERVENTION:25122}  Diabetes (A1c goal <7%) -Uncontrolled -Current medications: Trulicity 7.7LT (plan to increase to 3 mg QW) Metformin 1000 mg BID  -Medications previously tried: SGLT2 (recurrent yeast infxn),   -Current home glucose readings fasting glucose: *** post prandial glucose: *** -{ACTIONS;DENIES/REPORTS:21021675::"Denies"} hypoglycemic/hyperglycemic symptoms -Current meal patterns:  breakfast: ***  lunch: ***  dinner: *** snacks: *** drinks: *** -Current exercise: *** -Educated on {CCM DM COUNSELING:25123} -Counseled to check feet daily and get yearly eye exams -{CCMPHARMDINTERVENTION:25122}  Heart Failure (Goal: manage symptoms and prevent exacerbations) -{US controlled/uncontrolled:25276} -Last ejection fraction: *** (Date: ***) -HF type: {type of heart failure:30421350} -NYHA Class: {CHL HP Upstream Pharm NYHA Class:2072771087} -AHA HF Stage: {CHL HP Upstream Pharm AHA HF Stage:3392291034} -Current treatment: *** -Medications previously tried: ***  -Current home BP/HR readings: *** -Current dietary habits: *** -Current exercise habits: *** -Educated on {CCM HF Counseling:25125} -{CCMPHARMDINTERVENTION:25122}  Patient Goals/Self-Care Activities Patient will:  - {pharmacypatientgoals:24919}  Medication Assistance:  {MEDASSISTANCEINFO:25044}  Patient's preferred pharmacy is:  [place], [place] [address] Phone: [phone] Fax: [phone]  [facility] ([place], [place] [facility] [address] [place] [address] [place] [address] Phone: [phone] Fax: [phone]  [facility] [place], [place] - [address] [place] [address] Phone: [phone] Fax: [phone]  [facility] ([facility] ) - [place], [place] [address] Phone: [phone] Fax: [phone]   Pt endorses ***% compliance  Follow Up:  Patient agrees to Care Plan and Follow-up. Plan: [facility] ***. Pharmacist ***  Future Appointments  Date Time Provider [place]  12/04/2021 10:15 AM [facility] PHARMACY [facility]  12/06/2021  9:20 AM Larey Dresser, MD [facility] None  12/07/2021  9:00 AM [facility]  12/13/2021  8:20 AM [name], MD [facility]  02/21/2022  8:40 AM [name], NP [facility]  09/21/2022  8:15 AM Edrick Kins, DPM [facility]    {jpsigs:26361}

## 2021-12-04 NOTE — Progress Notes (Signed)
Subjective:   Kim Buchanan is a 71 y.o. female who presents for Medicare Annual (Subsequent) preventive examination.  I connected with  MILYN STAPLETON on 12/04/21 by a Telephone enabled telemedicine application and verified that I am speaking with the correct person using two identifiers.   I discussed the limitations of evaluation and management by telemedicine. The patient expressed understanding and agreed to proceed.  Patient location: home  Provider location: [place] not in office   Review of Systems     Cardiac Risk Factors include: advanced age (>13mn, >>65women);diabetes mellitus;hypertension;obesity (BMI >30kg/m2);sedentary lifestyle     Objective:    Today's Vitals   There is no height or weight on file to calculate BMI.  Advanced Directives 03/21/2021 12/02/2020 11/30/2019 09/11/2019 08/11/2019 10/03/2018 01/04/2018  Does Patient Have a Medical Advance Directive? No Yes Yes Yes Yes Yes No  Type of Advance Directive - [place] will [place] will - [place] ValleyLiving will [place]Living will -  Does patient want to make changes to medical advance directive? - - - No - Patient declined - - -  Copy of [place] Chart? - No - copy requested No - copy requested - No - copy requested No - copy requested -  Would patient like information on creating a medical advance directive? - - - - - - No - Patient declined    Current Medications (verified) Outpatient Encounter Medications as of 12/04/2021  Medication Sig   aspirin 81 MG tablet Take 81 mg by mouth daily.    atorvastatin (LIPITOR) 10 MG tablet Take 1 tablet (10 mg total) by mouth at bedtime.   blood glucose meter kit and supplies Dispense based on patient and insurance preference. Check fasting blood sugar once daily. (FOR ICD E11.69).   digoxin (LANOXIN) 0.125 MG tablet Take 1 tablet (0.125 mg total)  by mouth daily.   Dulaglutide (TRULICITY) 1.5 MVI/1.5PPSOPN Inject 1.5 mg into the skin once a week.   gabapentin (NEURONTIN) 300 MG capsule Take 1 capsule (300 mg total) by mouth 3 (three) times daily.   Lancets (ONETOUCH DELICA PLUS [id number] MISC TEST ONCE DAILY   metFORMIN (GLUCOPHAGE) 1000 MG tablet TAKE 1 TABLET BY MOUTH  TWICE DAILY WITH MEALS   metoprolol succinate (TOPROL-XL) 25 MG 24 hr tablet TAKE 1 AND 1/2 TABLETS BY  MOUTH DAILY   ONETOUCH ULTRA test strip TEST ONCE DAILY   pantoprazole (PROTONIX) 40 MG tablet Take 1 tablet (40 mg total) by mouth daily.   polyethylene glycol powder (GLYCOLAX/MIRALAX) 17 GM/SCOOP powder Take 17 g by mouth daily.   potassium chloride (KLOR-CON) 10 MEQ tablet Take 1 tablet (10 mEq total) by mouth as directed. Take with your Torsemide.   QUEtiapine (SEROQUEL) 200 MG tablet Take 1 tablet (200 mg total) by mouth daily. Patient takes at bed time.   spironolactone (ALDACTONE) 25 MG tablet TAKE ONE-HALF TABLET BY  MOUTH DAILY   torsemide (DEMADEX) 20 MG tablet Take 2 tablets (40 mg total) by mouth daily. Take Potassium with Torsemide.   valsartan (DIOVAN) 40 MG tablet Take 0.5 tablets (20 mg total) by mouth 2 (two) times daily.   amiodarone (PACERONE) 200 MG tablet Take 1 tablet (200 mg total) by mouth daily.   No facility-administered encounter medications on file as of 12/04/2021.    Allergies (verified) Entresto [sacubitril-valsartan] and Lisinopril   History: Past Medical History:  Diagnosis Date  Anxiety    Arthritis    lower back   COPD (chronic obstructive pulmonary disease) (HCC)    Depression    Diabetes mellitus without complication (HCC)    GERD (gastroesophageal reflux disease)    Hyperlipidemia    Morbid obesity (HCC)    NICM (nonischemic cardiomyopathy) ([place])    a. 03/2019 Echo: Nl LVEF; b. 09/04/2019 Echo: EF 25-30%; c. 09/11/2019 Cath: nonobs LAD/RCA dzs. EF 50-55%; c. 12/2020 Echo: EF 30-35%, glob HK. Mild LVH. Nl RVSP. Mild MR;  d. 02/2021 RHC: Elev RH pressures. CO/CI 2.8/1.3; e. 06/2021 cMRI: EF 40%, marked sept-lat dyssynch & septal HK. Small, discrete areas of mid-wall LGE in apical lateral and apical septal walls - ? h/o myocarditis.   Nonobstructive CAD (coronary artery disease)    a. 03/2019 [facility]): EF 63%, ant ischemia; b. 08/2019 Cath: LM nl, LAD 57m RCA 70p (iFR nl @ 0.97), 50p. EF 50-55%-->Med Rx; c. 06/2020 Cor CTA: Nl FFRs throughout cor tree.   Osteoporosis    Panic attack    PTSD (post-traumatic stress disorder)    PVC's (premature ventricular contractions)    a. 05/2021 Zio: 20.6% PVC burden. 13.4 secs of NSVT-->amio added; b. 06/2021 Zio: 5.8% PVC burden. Rare supraventrciular ectopy.   Rectal fistula    Sleep apnea    a. Prev wore CPAP but lost insurance and hasn't been using since (now has insurance).   Wears dentures    full upper   Past Surgical History:  Procedure Laterality Date   CARDIAC CATHETERIZATION     COLONOSCOPY  2014   COLONOSCOPY WITH PROPOFOL N/A 10/01/2016   Procedure: COLONOSCOPY WITH PROPOFOL;  Surgeon: DLucilla Lame MD;  [facility]  Service: Endoscopy;  Laterality: N/A;   CORONARY PRESSURE WIRE/FFR WITH 3D MAPPING N/A 09/11/2019   Procedure: Coronary Pressure Wire/FFR w/3D Mapping;  Surgeon: ENelva Bush MD;  [facility];  Service: Cardiovascular;  Laterality: N/A;   CYSTOCELE REPAIR N/A 12/17/2016   Procedure: ANTERIOR REPAIR (CYSTOCELE);  Surgeon: MBrayton Mars MD;  Location: [facility];  Service: Gynecology;  Laterality: N/A;   ESOPHAGOGASTRODUODENOSCOPY (EGD) WITH PROPOFOL N/A 08/11/2019   Procedure: ESOPHAGOGASTRODUODENOSCOPY (EGD) WITH PROPOFOL;  Surgeon: WLucilla Lame MD;  Location: [facility];  Service: Endoscopy;  Laterality: N/A;   POLYPECTOMY  10/01/2016   Procedure: POLYPECTOMY;  Surgeon: DLucilla Lame MD;  [facility]  Service: Endoscopy;;   RECTAL SURGERY  06/28/2015   Rectal prolapse,  [facility]ar[facility]  RIGHT HEART CATH N/A 03/21/2021   Procedure: RIGHT HEART CATH;  Surgeon: ENelva Bush MD;  [facility];  Service: Cardiovascular;  Laterality: N/A;   RIGHT/LEFT HEART CATH AND CORONARY ANGIOGRAPHY Bilateral 09/11/2019   Procedure: RIGHT/LEFT HEART CATH AND CORONARY ANGIOGRAPHY;  Surgeon: GMinna Merritts MD;  [facility];  Service: Cardiovascular;  Laterality: Bilateral;   TOTAL ABDOMINAL HYSTERECTOMY W/ BILATERAL SALPINGOOPHORECTOMY     TUBAL LIGATION     VAGINAL HYSTERECTOMY Bilateral 12/17/2016   Procedure: HYSTERECTOMY VAGINAL WITH BILATERAL SALPINGO OOPHERECTOMY;  Surgeon: MBrayton Mars MD;  Location: [facility];  Service: Gynecology;  Laterality: Bilateral;   Family History  Problem Relation Age of Onset   Diabetes Mother    Diabetes Sister    Cancer Sister    Diabetes Brother    Lymphoma Brother    Heart disease Father    Hypertension Father    Diabetes Sister    Diabetes Brother  Lymphoma Brother    Heart disease Brother    Healthy Sister    Breast cancer Neg Hx    Ovarian cancer Neg Hx    Colon cancer Neg Hx    Social History   Socioeconomic History   Marital status: Married    Spouse name: Paediatric nurse   Number of children: 1   Years of education: Not on file   Highest education level: 9th grade  Occupational History    Employer: DISABLED    Comment: for panic attacks  Tobacco Use   Smoking status: Former    Packs/day: 1.50    Years: 50.00    Pack years: 75.00    Types: Cigarettes    Quit date: 11/29/2018    Years since quitting: 3.0   Smokeless tobacco: Never   Tobacco comments:    started at age 51  Vaping Use   Vaping Use: Never used  Substance and Sexual Activity   Alcohol use: No    Alcohol/week: 0.0 standard drinks   Drug use: No   Sexual activity: Not Currently  Other Topics Concern   Not on file  Social History Narrative   Pt had one son, who  died when was 68 YO in a MVA.   Social Determinants of Health   Financial Resource Strain: Low Risk    Difficulty of Paying Living Expenses: Not hard at all  Food Insecurity: No Food Insecurity   Worried About Charity fundraiser in the Last Year: Never true   [place] in the Last Year: Never true  Transportation Needs: No Transportation Needs   Lack of Transportation (Medical): No   Lack of Transportation (Non-Medical): No  Physical Activity: Inactive   Days of Exercise per Week: 0 days   Minutes of Exercise per Session: 0 min  Stress: No Stress Concern Present   Feeling of Stress : Not at all  Social Connections: Socially Isolated   Frequency of Communication with Friends and Family: Once a week   Frequency of Social Gatherings with Friends and Family: Never   Attends Religious Services: Never   Printmaker: No   Attends Music therapist: Never   Marital Status: Married    Tobacco Counseling Counseling given: Not Answered Tobacco comments: started at age 46   Clinical Intake:  Pre-visit preparation completed: Yes  Pain : No/denies pain     Nutritional Risks: None Diabetes: Yes CBG done?: No Did pt. bring in CBG monitor from home?: No  How often do you need to have someone help you when you read instructions, pamphlets, or other written materials from your doctor or pharmacy?: 1 - Never  Diabetic?  Yes   Nutrition Risk Assessment:  Has the patient had any N/V/D within the last 2 months?  No  Does the patient have any non-healing wounds?  No  Has the patient had any unintentional weight loss or weight gain?  No   Diabetes:  Is the patient diabetic?  Yes  If diabetic, was a CBG obtained today?  No  Did the patient bring in their glucometer from home?  No  How often do you monitor your CBG's? Does not check blood sugar.   Financial Strains and Diabetes Management:  Are you having any financial strains with  the device, your supplies or your medication? No .  Does the patient want to be seen by Chronic Care Management for management of their diabetes?  No  Would  the patient like to be referred to a Nutritionist or for Diabetic Management?  No   Diabetic Exams:  Diabetic Eye Exam: Completed . Pt has been advised about the importance in completing this exam.   Diabetic Foot Exam: . Pt has been advised about the importance in completing this exam. Interpreter Needed?: No  Information entered by :: Leroy Kennedy LPN   Activities of Daily Living In your present state of health, do you have any difficulty performing the following activities: 12/04/2021 03/21/2021  Hearing? N N  Vision? N N  Difficulty concentrating or making decisions? N N  Walking or climbing stairs? N N  Dressing or bathing? N N  Doing errands, shopping? N -  Preparing Food and eating ? N -  Using the Toilet? N -  In the past six months, have you accidently leaked urine? N -  Do you have problems with loss of bowel control? N -  Managing your Medications? N -  Managing your Finances? N -  Housekeeping or managing your Housekeeping? N -  Some recent data might be hidden    Patient Care Team: [name], NP as PCP - General (Nurse Practitioner) [name], MD as PCP - Cardiology (Cardiology) [name], MD as PCP - Electrophysiology (Cardiology) [name], OD as Consulting Physician (Optometry) [name], MD as Consulting Physician (Pulmonary Disease)  Indicate any recent Medical Services you may have received from other than [facility] providers in the past year (date may be approximate).     Assessment:   This is a routine wellness examination for [place].  Hearing/Vision screen Hearing Screening - Comments:: No trouble hearing Vision Screening - Comments:: Dr. Ellin Mayhew Up to date  Dietary issues and exercise activities discussed: Current Exercise Habits: The patient does not  participate in regular exercise at present, Exercise limited by: None identified   Goals Addressed             This Visit's Progress    Patient Stated       Loose some weight       Depression Screen PHQ 2/9 Scores 12/04/2021 11/24/2021 08/25/2021 06/13/2021 01/23/2021 12/02/2020 10/17/2020  PHQ - 2 Score 0 0 0 0 0 0 0  PHQ- 9 Score '2 2 3 3 4 6 7    ' Fall Risk Fall Risk  12/04/2021 11/24/2021 12/02/2020 03/11/2020 11/30/2019  Falls in the past year? 0 0 0 0 0  Number falls in past yr: 0 0 - 0 0  Injury with Fall? 0 0 - 0 0  Comment - - - - -  Risk for fall due to : - No Fall Risks Medication side effect - -  Follow up Falls evaluation completed;Falls prevention discussed Falls evaluation completed Falls evaluation completed;Education provided;Falls prevention discussed - -    FALL RISK PREVENTION PERTAINING TO THE HOME:  Any stairs in or around the home? No  If so, are there any without handrails? No  Home free of loose throw rugs in walkways, pet beds, electrical cords, etc? Yes  Adequate lighting in your home to reduce risk of falls? Yes   ASSISTIVE DEVICES UTILIZED TO PREVENT FALLS:  Life alert? No  Use of a cane, walker or w/c? Yes  Grab bars in the bathroom? No  Shower chair or bench in shower? No  Elevated toilet seat or a handicapped toilet? Yes   TIMED UP AND GO:  Was the test performed? No .    Cognitive Function:  Normal cognitive status assessed by direct observation by this Nurse Health Advisor. No abnormalities found.       6CIT Screen 12/02/2020 10/03/2018 05/13/2017  What Year? 0 points 0 points 0 points  What month? 0 points 0 points 0 points  What time? 0 points 0 points 0 points  Count back from 20 0 points 0 points 0 points  Months in reverse 0 points 0 points 0 points  Repeat phrase 0 points 0 points 2 points  Total Score 0 0 2    Immunizations Immunization History  Administered Date(s) Administered   Fluad Quad(high Dose 65+) 07/26/2021    Influenza, High Dose Seasonal PF 06/16/2019, 07/04/2020   Influenza, Seasonal, Injecte, Preservative Fre 08/18/2008, 07/14/2014   Influenza,inj,Quad PF,6+ Mos 09/25/2013, 08/05/2015, 08/02/2016, 07/24/2017   Moderna Sars-Covid-2 Vaccination 12/02/2019, 12/30/2019, 10/29/2020   Pneumococcal Conjugate-13 03/13/2018   Pneumococcal Polysaccharide-23 08/06/2019   Tdap 08/08/2009, 05/13/2014    TDAP status: Up to date  Flu Vaccine status: Up to date  Pneumococcal vaccine status: Up to date  Covid-19 vaccine status: Information provided on how to obtain vaccines.   Qualifies for Shingles Vaccine? Yes   Zostavax completed No   Shingrix Completed?: No.    Education has been provided regarding the importance of this vaccine. Patient has been advised to call insurance company to determine out of pocket expense if they have not yet received this vaccine. Advised may also receive vaccine at local pharmacy or Health Dept. Verbalized acceptance and understanding.  Screening Tests Health Maintenance  Topic Date Due   COVID-19 Vaccine (4 - Booster for Moderna series) 12/24/2020   COLONOSCOPY (Pts 45-55yr Insurance coverage will need to be confirmed)  10/01/2021   Zoster Vaccines- Shingrix (1 of 2) 02/21/2022 (Originally 02/11/1970)   MAMMOGRAM  01/17/2022   OPHTHALMOLOGY EXAM  02/07/2022   FOOT EXAM  03/08/2022   HEMOGLOBIN A1C  05/24/2022   TETANUS/TDAP  05/13/2024   Pneumonia Vaccine 69 Years old  Completed   INFLUENZA VACCINE  Completed   DEXA SCAN  Completed   Hepatitis C Screening  Completed   HPV VACCINES  Aged Out    Health Maintenance  Health Maintenance Due  Topic Date Due   COVID-19 Vaccine (4 - Booster for Moderna series) 12/24/2020   COLONOSCOPY (Pts 45-478yrInsurance coverage will need to be confirmed)  10/01/2021    Colorectal cancer screening: Type of screening: Colonoscopy. Completed 2023. Repeat every   years  Mammogram status: Ordered  . Pt provided with contact  info and advised to call to schedule appt.   Bone Density status: Ordered  . Pt provided with contact info and advised to call to schedule appt.  Lung Cancer Screening: (Low Dose CT Chest recommended if Age 69-80ears, 30 pack-year currently smoking OR have quit w/in 15years.) does qualify.   Lung Cancer Screening Referral:   Additional Screening:  Hepatitis C Screening: does not qualify; Completed 2020  Vision Screening: Recommended annual ophthalmology exams for early detection of glaucoma and other disorders of the eye. Is the patient up to date with their annual eye exam?  Yes  Who is the provider or what is the name of the office in which the patient attends annual eye exams? [facility] If pt is not established with a provider, would they like to be referred to a provider to establish care? No .   Dental Screening: Recommended annual dental exams for proper oral hygiene  Community Resource Referral / Chronic Care Management: CRR required this  visit?  No   CCM required this visit?  No      Plan:     I have personally reviewed and noted the following in the patients chart:   Medical and social history Use of alcohol, tobacco or illicit drugs  Current medications and supplements including opioid prescriptions.  Functional ability and status Nutritional status Physical activity Advanced directives List of other physicians Hospitalizations, surgeries, and ER visits in previous 12 months Vitals Screenings to include cognitive, depression, and falls Referrals and appointments  In addition, I have reviewed and discussed with patient certain preventive protocols, quality metrics, and best practice recommendations. A written personalized care plan for preventive services as well as general preventive health recommendations were provided to patient.     Leroy Kennedy, LPN   [id number]   Nurse Notes:

## 2021-12-05 ENCOUNTER — Ambulatory Visit: Payer: Medicare Other

## 2021-12-05 ENCOUNTER — Telehealth: Payer: Self-pay

## 2021-12-05 NOTE — Telephone Encounter (Signed)
Patient  left a voicemail and has some questions about the prep for the colonoscopy.

## 2021-12-05 NOTE — Telephone Encounter (Signed)
Patient asked when prep will be sent to pharmacy. Prep was sent on 02/07 to pharmacy listed. Pt verbalized understanding.

## 2021-12-06 ENCOUNTER — Ambulatory Visit (HOSPITAL_COMMUNITY)
Admission: RE | Admit: 2021-12-06 | Discharge: 2021-12-06 | Disposition: A | Discharge status: Home / Self Care | Payer: Medicare Other | Source: Ambulatory Visit | Attending: Cardiology | Admitting: Cardiology

## 2021-12-06 ENCOUNTER — Encounter (HOSPITAL_COMMUNITY): Payer: Self-pay | Admitting: Cardiology

## 2021-12-06 ENCOUNTER — Other Ambulatory Visit: Payer: Self-pay

## 2021-12-06 VITALS — BP 124/80 | HR 86 | Wt 222.2 lb

## 2021-12-06 DIAGNOSIS — Z7984 Long term (current) use of oral hypoglycemic drugs: Secondary | ICD-10-CM | POA: Insufficient documentation

## 2021-12-06 DIAGNOSIS — R946 Abnormal results of thyroid function studies: Secondary | ICD-10-CM | POA: Diagnosis not present

## 2021-12-06 DIAGNOSIS — I454 Nonspecific intraventricular block: Secondary | ICD-10-CM | POA: Insufficient documentation

## 2021-12-06 DIAGNOSIS — J984 Other disorders of lung: Secondary | ICD-10-CM | POA: Insufficient documentation

## 2021-12-06 DIAGNOSIS — I5020 Unspecified systolic (congestive) heart failure: Secondary | ICD-10-CM

## 2021-12-06 DIAGNOSIS — J449 Chronic obstructive pulmonary disease, unspecified: Secondary | ICD-10-CM | POA: Diagnosis not present

## 2021-12-06 DIAGNOSIS — Z87891 Personal history of nicotine dependence: Secondary | ICD-10-CM | POA: Insufficient documentation

## 2021-12-06 DIAGNOSIS — Z79899 Other long term (current) drug therapy: Secondary | ICD-10-CM | POA: Diagnosis not present

## 2021-12-06 DIAGNOSIS — I2510 Atherosclerotic heart disease of native coronary artery without angina pectoris: Secondary | ICD-10-CM | POA: Diagnosis not present

## 2021-12-06 DIAGNOSIS — I493 Ventricular premature depolarization: Secondary | ICD-10-CM | POA: Diagnosis not present

## 2021-12-06 DIAGNOSIS — I5022 Chronic systolic (congestive) heart failure: Secondary | ICD-10-CM | POA: Diagnosis not present

## 2021-12-06 DIAGNOSIS — I428 Other cardiomyopathies: Secondary | ICD-10-CM | POA: Insufficient documentation

## 2021-12-06 DIAGNOSIS — Z7982 Long term (current) use of aspirin: Secondary | ICD-10-CM | POA: Diagnosis not present

## 2021-12-06 DIAGNOSIS — E119 Type 2 diabetes mellitus without complications: Secondary | ICD-10-CM | POA: Diagnosis not present

## 2021-12-06 DIAGNOSIS — R7989 Other specified abnormal findings of blood chemistry: Secondary | ICD-10-CM | POA: Diagnosis not present

## 2021-12-06 LAB — TSH: TSH: 5.816 u[IU]/mL — ABNORMAL HIGH (ref 0.350–4.500)

## 2021-12-06 LAB — T4, FREE: Free T4: 0.81 ng/dL (ref 0.61–1.12)

## 2021-12-06 LAB — DIGOXIN LEVEL: Digoxin Level: 0.7 ng/mL — ABNORMAL LOW (ref 0.8–2.0)

## 2021-12-06 MED ORDER — METOPROLOL SUCCINATE ER 50 MG PO TB24: 50.0000 mg | ORAL_TABLET | Freq: Every day | ORAL | 11 refills | Status: DC

## 2021-12-06 MED ORDER — VALSARTAN 40 MG PO TABS: 40.0000 mg | ORAL_TABLET | Freq: Two times a day (BID) | ORAL | 11 refills | Status: DC

## 2021-12-06 MED ORDER — SPIRONOLACTONE 25 MG PO TABS: 25.0000 mg | ORAL_TABLET | Freq: Every day | ORAL | 11 refills | Status: DC

## 2021-12-06 NOTE — Progress Notes (Signed)
PCP: [name], NP Cardiology: Dr. Rockey Situ HF Cardiology: Dr. Aundra Dubin  71 y.o. with history of chronic systolic CHF/nonischemic cardiomyopathy, type 2 diabetes, and prior smoking was referred by Dr. Quentin Ore for evaluation of CHF.  She has been noted to have a cardiomyopathy since 2020.  Echo in 11/20 showed EF 25-30%.  LHC in 11/20 and coronary CTA in 9/21 have shown nonobstructive CAD.  She has been evaluated by pulmonary, mild restrictive lung disease and mild COPD.  She quit smoking in 2020, she does not drink ETOH.   Echo in 3/22 showed EF 30-35%, with normal RV.   [place] in 5/22 was worrisome, showing right > left heart failure and low cardiac output with low PAPI.   Zio patch in 8/22 showed 21% PVCs.  Amiodarone was started.  Cardiac MRI in 9/22 showed EF 40%, septal-lateral dyssynchrony, RV EF 45%, LGE in the mid-wall apical lateral and apical septal walls. Repeat Zio patch in 10/22 showed 5.8% PVCs, improved.    Patient stopped [place] after 3 yeast infections.   She returns for followup of CHF.  She is short of breath walking a long distance.  Does fine in the grocery store and no problem walking in today.  No orthopnea/PND.  Occasional palpitations.  Weight is down 12 lbs.   ECG (personally reviewed): NSR, IVCD 126 msec  Labs (6/22): K 4.4, creatinine 0.79 Labs (2/23): K 4.8, creatinine 0.95, LDL 18, TSH 5.37, ALT 102, AST 95, hgb 15.2  PMH: 1. Chronic systolic CHF: Nonischemic cardiomyopathy.   - Echo (6/20): Normal EF - Cardiolite (6/20): Anterior ischemia.  - Echo (11/20): EF 25-30% - LHC (11/20): 70% pRCA stenosis, FFR negative.  - Echo (8/21): EF 30-35% - Coronary CTA (9/21): Mild RCA disease, 40-59% mLAD stenosis but CT FFR negative.  - Echo (3/22): EF 30-35% with normal RV - RHC (5/22): mean RA 17, mean PA 28, mean PCWP 20, CI 1.3, PAPI < 1.  - Cardiac MRI (9/22): EF 40%, septal-lateral dyssynchrony, RV EF 45%, LGE in the mid-wall apical lateral and apical septal  walls. 2. COPD: Mild.  Quit smoking in 2020.  3. Restrictive lung disease: Mild restriction on PFTs.  4. Type 2 diabetes 5. Coronary artery disease: Nonobstructive.  6. Hyperlipidemia 7. OSA 8. GERD 9. Depression 10. Elevated LFTs: Long-standing.  HCV negative.  11. PVCs: Zio patch in 8/22 showed 21% PVCs.  Amiodarone was started. Repeat Zio patch in 10/22 showed 5.8% PVCs, improved.   12. Sleep study (8/22): No OSA.   FH: Brother with "heart surgery," ?CHF.  SH: Married, no ETOH, quit smoking in 2020, retired, lives in [place]: All systems reviewed and negative except as per HPI.   Current Outpatient Medications  Medication Sig Dispense Refill   aspirin 81 MG tablet Take 81 mg by mouth daily.      atorvastatin (LIPITOR) 10 MG tablet Take 1 tablet (10 mg total) by mouth at bedtime. 90 tablet 2   blood glucose meter kit and supplies Dispense based on patient and insurance preference. Check fasting blood sugar once daily. (FOR ICD E11.69). 1 each 0   digoxin (LANOXIN) 0.125 MG tablet Take 1 tablet (0.125 mg total) by mouth daily. 30 tablet 11   Dulaglutide (TRULICITY) 1.5 BJ/4.7WG SOPN Inject 1.5 mg into the skin once a week. 6 mL 1   gabapentin (NEURONTIN) 300 MG capsule Take 1 capsule (300 mg total) by mouth 3 (three) times daily. 90 capsule 0   Lancets ([place]  LANCET30G) MISC TEST ONCE DAILY 100 each 3   metFORMIN (GLUCOPHAGE) 1000 MG tablet TAKE 1 TABLET BY MOUTH  TWICE DAILY WITH MEALS 180 tablet 0   ONETOUCH ULTRA test strip TEST ONCE DAILY 100 strip 3   pantoprazole (PROTONIX) 40 MG tablet Take 1 tablet (40 mg total) by mouth daily. 90 tablet 4   polyethylene glycol powder (GLYCOLAX/MIRALAX) 17 GM/SCOOP powder Take 17 g by mouth daily.     potassium chloride (KLOR-CON) 10 MEQ tablet Take 1 tablet (10 mEq total) by mouth as directed. Take with your Torsemide. 90 tablet 3   QUEtiapine (SEROQUEL) 200 MG tablet Take 1 tablet (200 mg total) by mouth daily. Patient  takes at bed time. 90 tablet 1   torsemide (DEMADEX) 20 MG tablet Take 2 tablets (40 mg total) by mouth daily. Take Potassium with Torsemide. 60 tablet 11   amiodarone (PACERONE) 200 MG tablet Take 1 tablet (200 mg total) by mouth daily. 30 tablet 5   metoprolol succinate (TOPROL-XL) 50 MG 24 hr tablet Take 1 tablet (50 mg total) by mouth daily. 30 tablet 11   spironolactone (ALDACTONE) 25 MG tablet Take 1 tablet (25 mg total) by mouth daily. 30 tablet 11   valsartan (DIOVAN) 40 MG tablet Take 1 tablet (40 mg total) by mouth 2 (two) times daily. 60 tablet 11   No current facility-administered medications for this encounter.   BP 124/80    Pulse 86    Wt 100.8 kg (222 lb 3.2 oz)    SpO2 94%    BMI 41.98 kg/m  General: NAD Neck: No JVD, no thyromegaly or thyroid nodule.  Lungs: Clear to auscultation bilaterally with normal respiratory effort. CV: Nondisplaced PMI.  Heart regular S1/S2, no S3/S4, no murmur.  No peripheral edema.  No carotid bruit.  Normal pedal pulses.  Abdomen: Soft, nontender, no hepatosplenomegaly, no distention.  Skin: Intact without lesions or rashes.  Neurologic: Alert and oriented x 3.  Psych: Normal affect. Extremities: No clubbing or cyanosis.  HEENT: Normal.   Assessment/Plan: 1. Chronic systolic CHF: Nonischemic cardiomyopathy.  Echo in 3/22 with EF 30-35%, normal RV.  LHC in 11/20 and coronary CTA in 9/21 showed nonobstructive CAD.  [place] in 5/22 was worrisome with R>L heart failure and low cardiac output as well as low PAPI.  Cardiac MRI in 9/22 showed EF 40%, septal-lateral dyssynchrony, RV EF 45%, LGE in the mid-wall apical lateral and apical septal walls.  This was concerning for possible myocarditis (less likely amyloidosis or sarcoidosis).  Also concern that PVCs may play a role as Zio monitor in 8/22 showed 21% PVCs, improved to 5.8% with amiodarone.  She has NYHA class II symptoms.  She is not volume overloaded on exam.  - EF is now out of ICD range, QRS not  wide enough for CRT.  - Continue torsemide 40 mg daily with BMET today.  - Continue digoxin 0.125 daily, check level today.  - Increase valsartan to 40 mg bid.  She could not tolerate Entresto due to cough. BMET 10 days.  - Continue spironolactone 25 mg daily.   - Continue dapagliflozin 10 mg daily.  - Increase Toprol XL to 50 mg daily.  2. CAD: nonobstructive by LHC in 11/20 and coronary CTA in 9/21.  - Continue atorvastatin.  - Continue ASA 81 daily.  3. PVCs: 21% PVCs on 8/22 Zio monitor.  ?Playing role in cardiomyopathy. PVCs down to 5.8% in 10/22 on amiodarone.  - Continue amiodarone 200 mg daily.  TSH mildly elevated recently, will need to recheck with free T3 and free T4.  Also has chronic mild elevation in LFTs (see below).  4. Elevated LFTs: This appears to be chronic and mild.  LFT elevation pre-dates amiodarone use, and amiodarone does not seem to have worsened LFTs.   - PCP has ordered abdominal US.  - She had workup for viral hepatitis (negative) in the past.   - Would like re-evaluation by GI given use of amiodarone    Followup 3 wks with HF pharmacist for med titration, see APP in 6 wks.    Loralie Champagne 12/06/2021

## 2021-12-06 NOTE — Patient Instructions (Signed)
Medication Changes:  Increase Valsartan to 40 mg Twice daily  Increase toprol XL to 50 mg daily  Lab Work:  Labs done today, your results will be available in MyChart, we will contact you for abnormal readings.   Testing/Procedures:  Repeat blood work in 10 days  Referral:  Heart Failure Pharmacist for medication Tritration  Special Instructions // Education:  Do the following things EVERYDAY: Weigh yourself in the morning before breakfast. Write it down and keep it in a log. Take your medicines as prescribed Eat low salt foods--Limit salt (sodium) to 2000 mg per day.  Stay as active as you can everyday Limit all fluids for the day to less than 2 liters   Follow-Up in: 6 weeks  At the [facility], you and your health needs are our priority. We have a designated team specialized in the treatment of Heart Failure. This Care Team includes your primary Heart Failure Specialized Cardiologist (physician), Advanced Practice Providers (APPs- Physician Assistants and Nurse Practitioners), and Pharmacist who all work together to provide you with the care you need, when you need it.   You may see any of the following providers on your designated Care Team at your next follow up:  Dr Glori Bickers Dr Haynes Kerns, NP Lyda Jester, [place] [facility] [place], [place] Audry Riles, PharmD   Please be sure to bring in all your medications bottles to every appointment.   Need to Contact us:  If you have any questions or concerns before your next appointment please send [place] a message through [place] or call our office at [phone].    TO LEAVE A MESSAGE FOR THE NURSE SELECT OPTION 2, PLEASE LEAVE A MESSAGE INCLUDING: YOUR NAME DATE OF BIRTH CALL BACK NUMBER REASON FOR CALL**this is important as we prioritize the call backs  YOU WILL RECEIVE A CALL BACK THE SAME DAY AS LONG AS YOU CALL BEFORE 4:00 PM

## 2021-12-07 ENCOUNTER — Ambulatory Visit
Admission: RE | Admit: 2021-12-07 | Discharge: 2021-12-07 | Disposition: A | Discharge status: Home / Self Care | Payer: Medicare Other | Source: Ambulatory Visit | Attending: Nurse Practitioner | Admitting: Nurse Practitioner

## 2021-12-07 DIAGNOSIS — R748 Abnormal levels of other serum enzymes: Secondary | ICD-10-CM | POA: Insufficient documentation

## 2021-12-07 DIAGNOSIS — R945 Abnormal results of liver function studies: Secondary | ICD-10-CM | POA: Diagnosis not present

## 2021-12-07 DIAGNOSIS — K760 Fatty (change of) liver, not elsewhere classified: Secondary | ICD-10-CM | POA: Diagnosis not present

## 2021-12-07 LAB — T3, FREE: T3, Free: 2.6 pg/mL (ref 2.0–4.4)

## 2021-12-08 ENCOUNTER — Telehealth: Payer: Self-pay

## 2021-12-08 NOTE — Telephone Encounter (Signed)
Left message on voicemail for pt to r/t my call to schedule a f/u appt for elevated LFTs

## 2021-12-08 NOTE — Telephone Encounter (Signed)
Patient left a voicemail that she missed your call. She said to call her back

## 2021-12-08 NOTE — Progress Notes (Signed)
Please let patient know that her ultrasound shows that she has fatty liver.  I recommend she follow a low fat diet.  Continue to follow up as discussed.

## 2021-12-08 NOTE — Telephone Encounter (Signed)
-----   Message from [place], [place] sent at 12/07/2021  8:36 PM EST -----  ----- Message ----- From: Lucilla Lame, MD Sent: 12/07/2021   3:21 PM EST To: Larey Dresser, MD, Ginger Feldpausch, CMA  Ginger,  Can we have this patient seen for a follow up with me for increased LFT's ----- Message ----- From: Larey Dresser, MD Sent: 12/07/2021  12:05 AM EST To: Lucilla Lame, MD  Kim Buchanan tells me she sees you for GI care.  She has had chronically mildly elevated LFTs and is now on amiodarone.  I do not think amiodarone has caused it (predates amiodarone), but wanted to see if you would evaluate her to give me some idea what is triggering the LFT elevation and whether it would be safe to continue amiodarone.  Thanks.

## 2021-12-11 NOTE — Telephone Encounter (Signed)
Patient left a voicemail and wants a call back

## 2021-12-11 NOTE — Telephone Encounter (Signed)
I called pt and I scheduled an appt for 3/23 after her upcoming procedure

## 2021-12-13 ENCOUNTER — Ambulatory Visit: Payer: Medicare Other | Admitting: Cardiology

## 2021-12-18 ENCOUNTER — Other Ambulatory Visit
Admission: RE | Admit: 2021-12-18 | Discharge: 2021-12-18 | Disposition: A | Discharge status: Home / Self Care | Payer: Medicare Other | Attending: Cardiology | Admitting: Cardiology

## 2021-12-18 ENCOUNTER — Telehealth: Payer: Self-pay

## 2021-12-18 ENCOUNTER — Telehealth: Payer: Medicare Other

## 2021-12-18 DIAGNOSIS — I5020 Unspecified systolic (congestive) heart failure: Secondary | ICD-10-CM | POA: Insufficient documentation

## 2021-12-18 LAB — COMPREHENSIVE METABOLIC PANEL
ALT: 98 U/L — ABNORMAL HIGH (ref 0–44)
AST: 96 U/L — ABNORMAL HIGH (ref 15–41)
Albumin: 4 g/dL (ref 3.5–5.0)
Alkaline Phosphatase: 144 U/L — ABNORMAL HIGH (ref 38–126)
Anion gap: 14 (ref 5–15)
BUN: 14 mg/dL (ref 8–23)
CO2: 21 mmol/L — ABNORMAL LOW (ref 22–32)
Calcium: 9.2 mg/dL (ref 8.9–10.3)
Chloride: 99 mmol/L (ref 98–111)
Creatinine, Ser: 0.64 mg/dL (ref 0.44–1.00)
GFR, Estimated: >60 mL/min (ref >60)
Glucose, Bld: 252 mg/dL — ABNORMAL HIGH (ref 70–99)
Potassium: 4.3 mmol/L (ref 3.5–5.1)
Sodium: 134 mmol/L — ABNORMAL LOW (ref 135–145)
Total Bilirubin: 0.4 mg/dL (ref 0.3–1.2)
Total Protein: 7.9 g/dL (ref 6.5–8.1)

## 2021-12-18 NOTE — Addendum Note (Signed)
Addended by: Antonieta Iba C on: 12/18/2021 10:04 AM   Modules accepted: Orders

## 2021-12-18 NOTE — Telephone Encounter (Signed)
°  Care Management   Follow Up Note   12/18/2021 Name: Kim Buchanan MRN: [id number] DOB: [birth year]   Referred by: [name], NP Reason for referral : Chronic Care Management   An unsuccessful telephone outreach was attempted today. The patient was referred to the case management team for assistance with care management and care coordination.   Follow Up Plan:  will attempt to RS patient.   Also noted intended dose increase on trulicity to 3mg , will prepare pap for prescriber signature.   Madelin Rear, PharmD, [place] Clinical Pharmacist  [facility]  [phone]

## 2021-12-19 NOTE — Telephone Encounter (Signed)
Kim Buchanan,  Looks like this patient has a pharmacy clinic appt 12/28/2021. Does this effect your appt? Do I still need to reschedule?  Thank you, Kim Buchanan, Kim Buchanan, Kim Buchanan, Kim Buchanan [id number] Direct Dial: [phone] [email or web address] Website: [email or web address]

## 2021-12-27 ENCOUNTER — Telehealth: Payer: Self-pay | Admitting: Nurse Practitioner

## 2021-12-27 NOTE — Telephone Encounter (Signed)
Patient states its time to renew her Trulicity and apply for the assistance program. Patient states she does not have her tax form but the practice should have it on file. Patient states her financial status has not changed. Patient would like a follow up call regarding the process.  ?

## 2021-12-27 NOTE — Progress Notes (Incomplete)
***In Progress***    Advanced Heart Failure Clinic Note   fix order of meds  PCP: [name], NP Cardiology: Dr. Rockey Situ HF Cardiology: Dr. Aundra Dubin   HPI: Kim Buchanan is a 71 y.o. with history of chronic systolic CHF/nonischemic cardiomyopathy, type 2 diabetes, and prior smoking referred by Dr. Quentin Ore for evaluation of CHF.    Noted to have cardiomyopathy since 2020.  Echo in 08/2019 showed EF 25-30%.  LHC in 08/2019 and coronary CTA in 06/2020 have shown nonobstructive CAD.  She has been previously evaluated by pulmonary for mild restrictive lung disease and mild COPD. She quit smoking in 2020, she had not drunk ETOH.  Echo in 12/2020 showed EF 30-35%, with normal RV.  [place] in 02/2021 was worrisome, showing right > left heart failure and low cardiac output with low PAPI.    Zio patch in 05/2021 showed 21% PVCs. Amiodarone was started.  Cardiac MRI in 06/2021 showed EF 40%, septal-lateral dyssynchrony, RV EF 45%, LGE in the mid-wall apical lateral and apical septal walls. Repeated Zio patch in 07/2021 showed 5.8% PVCs, improved.     Patient stopped [place] after 3 yeast infections.    Patient was last seen in AHF clinic by APP on 12/06/2021.  She reported SOB walking a long distance. Reported able to walk fine in the grocery store. Denied orthopnea/PND.  Endorsed occasional palpitations.  Weight has decreased 12 lbs from previous visit.   Today she returns to HF clinic for pharmacist medication titration. At last visit with MD valsartan was increased to 40 mg BID and metoprolol succinate was increased to 50 mg daily.  BMET on 12/18/2021 stable with Scr 0.64 and K 4.3. Of note, she could not tolerate Entresto due to cough.   Overall feeling ***. Dizziness, lightheadedness, fatigue:  Chest pain or palpitations:  How is your breathing?: *** SOB: Able to complete all ADLs. Activity level ***  Weight at home pounds. Takes furosemide/torsemide/bumex *** mg *** daily.   LEE PND/Orthopnea  Appetite *** Low-salt diet:   Physical Exam Cost/affordability of meds   HF Medications: Metoprolol succinate 50 mg daily Valsartan 40 mg daily Spironolactone 25 mg daily Digoxin 0.125 mg daily Torsemide 40 mg daily Kcl 10 mEq daily  Has the patient been experiencing any side effects to the medications prescribed?  {YES NO:22349}  Does the patient have any problems obtaining medications due to transportation or finances?   {YES NO:22349}[employer] Medicare  Understanding of regimen: {excellent/good/fair/poor:19665} Understanding of indications: {excellent/good/fair/poor:19665} Potential of compliance: {excellent/good/fair/poor:19665} Patient understands to avoid NSAIDs. Patient understands to avoid decongestants.    Pertinent Lab Values: 12/18/2021 - Serum creatinine 0.64, BUN 14, Potassium 4.3, Sodium 134,  12/06/2021 - Digoxin 0.7   Vital Signs: Weight: *** (last clinic weight: 222.2 lbs) Blood pressure: ***  Heart rate: ***   Assessment/Plan:  1. Chronic systolic CHF: Nonischemic cardiomyopathy.  Echo in 12/2020 with EF 30-35%, normal RV.  LHC in 11/20 and coronary CTA in 06/2020 showed nonobstructive CAD.  [place] in 02/2021 was worrisome with R>L heart failure and low cardiac output as well as low PAPI.  Cardiac MRI in 06/2021 showed EF 40%, septal-lateral dyssynchrony, RV EF 45%, LGE in the mid-wall apical lateral and apical septal walls.  This was concerning for possible myocarditis (less likely amyloidosis or sarcoidosis).  Also concern that PVCs may play a role as Zio monitor in 05/2021 showed 21% PVCs, improved to 5.8% with amiodarone.  ***She has NYHA class II symptoms.  ***She is not volume overloaded on  exam.  - EF is now out of ICD range, QRS not wide enough for CRT.  - Continue torsemide 40 mg daily. - ***Increase metoprolol succinate to 100 mg daily.  -***continue/increase valsartan to 80 mg  mg bid.  She could not tolerate Entresto due to cough. BMET  10 days.  - Continue spironolactone 25 mg daily.   - Continue dapagliflozin 10 mg daily.  - ***Continue  digoxin 0.125 daily. - *** Stop Kcl tablets.  2. CAD: nonobstructive by LHC in 11/20 and coronary CTA in 9/21.  - Continue atorvastatin.  - Continue ASA 81 daily.  3. PVCs: 21% PVCs on 8/22 Zio monitor.  ?Playing role in cardiomyopathy. PVCs down to 5.8% in 10/22 on amiodarone.  - Continue amiodarone 200 mg daily.  TSH mildly elevated recently, will need to recheck with free T3 and free T4.  Also has chronic mild elevation in LFTs (see below).  4. Elevated LFTs: This appears to be chronic and mild.  LFT elevation pre-dates amiodarone use, and amiodarone does not seem to have worsened LFTs.   - PCP has ordered abdominal US.  - She had workup for viral hepatitis (negative) in the past.   - Would like re-evaluation by GI given use of amiodarone     Followup 3 wks with HF pharmacist for med titration, see APP in 6 wks.    Follow up ***   Audry Riles, PharmD, BCPS, BCCP, CPP Heart Failure Clinic Pharmacist [phone]

## 2021-12-28 ENCOUNTER — Other Ambulatory Visit: Payer: Self-pay

## 2021-12-28 ENCOUNTER — Ambulatory Visit (HOSPITAL_COMMUNITY)
Admission: RE | Admit: 2021-12-28 | Discharge: 2021-12-28 | Disposition: A | Discharge status: Home / Self Care | Payer: Medicare Other | Source: Ambulatory Visit | Attending: Internal Medicine | Admitting: Internal Medicine

## 2021-12-28 DIAGNOSIS — I428 Other cardiomyopathies: Secondary | ICD-10-CM | POA: Diagnosis not present

## 2021-12-28 DIAGNOSIS — Z79899 Other long term (current) drug therapy: Secondary | ICD-10-CM | POA: Diagnosis not present

## 2021-12-28 DIAGNOSIS — Z87891 Personal history of nicotine dependence: Secondary | ICD-10-CM | POA: Insufficient documentation

## 2021-12-28 DIAGNOSIS — E785 Hyperlipidemia, unspecified: Secondary | ICD-10-CM

## 2021-12-28 DIAGNOSIS — I2510 Atherosclerotic heart disease of native coronary artery without angina pectoris: Secondary | ICD-10-CM | POA: Insufficient documentation

## 2021-12-28 DIAGNOSIS — E119 Type 2 diabetes mellitus without complications: Secondary | ICD-10-CM | POA: Insufficient documentation

## 2021-12-28 DIAGNOSIS — Z7982 Long term (current) use of aspirin: Secondary | ICD-10-CM | POA: Insufficient documentation

## 2021-12-28 DIAGNOSIS — R7989 Other specified abnormal findings of blood chemistry: Secondary | ICD-10-CM | POA: Insufficient documentation

## 2021-12-28 DIAGNOSIS — I5022 Chronic systolic (congestive) heart failure: Secondary | ICD-10-CM | POA: Insufficient documentation

## 2021-12-28 MED ORDER — VALSARTAN 40 MG PO TABS: 40.0000 mg | ORAL_TABLET | Freq: Two times a day (BID) | ORAL | 3 refills | Status: DC

## 2021-12-28 MED ORDER — METOPROLOL SUCCINATE ER 50 MG PO TB24: 50.0000 mg | ORAL_TABLET | Freq: Every day | ORAL | 3 refills | Status: DC

## 2021-12-28 MED ORDER — ATORVASTATIN CALCIUM 10 MG PO TABS: 10.0000 mg | ORAL_TABLET | Freq: Every day | ORAL | 3 refills | Status: DC

## 2021-12-28 MED ORDER — AMIODARONE HCL 200 MG PO TABS: 200.0000 mg | ORAL_TABLET | Freq: Every day | ORAL | 3 refills | Status: DC

## 2021-12-28 MED ORDER — TORSEMIDE 20 MG PO TABS: ORAL_TABLET | ORAL | 3 refills | Status: DC

## 2021-12-28 NOTE — Patient Instructions (Signed)
It was a pleasure seeing you today! ? ?MEDICATIONS: ?-We are changing your medications today ?-Increase valsartan to 40 mg (1 tablet) twice daily. ?-Call if you have questions about your medications. ? ? ?NEXT APPOINTMENT: ?Return to clinic in 3 weeks with APP Clinic. ? ?In general, to take care of your heart failure: ?-Limit your fluid intake to 2 Liters (half-gallon) per day.   ?-Limit your salt intake to ideally 2-3 grams (2000-3000 mg) per day. ?-Weigh yourself daily and record, and bring that "weight diary" to your next appointment.  (Weight gain of 2-3 pounds in 1 day typically means fluid weight.) ?-The medications for your heart are to help your heart and help you live longer.   ?-Please contact us before stopping any of your heart medications. ? ?Call the clinic at [phone] with questions or to reschedule future appointments.  ?

## 2021-12-28 NOTE — Progress Notes (Signed)
?  ?Advanced Heart Failure Clinic Note  ? ?PCP: [name], NP ?Cardiology: Dr. Rockey Situ ?HF Cardiology: Dr. Aundra Dubin ? ?HPI: Kim Buchanan is a 71 y.o. with history of chronic systolic CHF/nonischemic cardiomyopathy, type 2 diabetes, and prior smoking referred by Dr. Quentin Ore for evaluation of CHF.   ? ?Noted to have cardiomyopathy since 2020.  Echo in 08/2019 showed EF 25-30%.  LHC in 08/2019 and coronary CTA in 06/2020 have shown nonobstructive CAD. Previously evaluated by pulmonary for mild restrictive lung disease and mild COPD. She had quit smoking in 2020, and had not drunk ETOH.  Echo in 12/2020 showed EF 30-35%, with normal RV.  [place] in 02/2021 was worrisome, showing right > left heart failure and low cardiac output with low PAPI.  ?  ?Zio patch in 05/2021 showed 21% PVCs. Amiodarone was started.  Cardiac MRI in 06/2021 showed EF 40%, septal-lateral dyssynchrony, RV EF 45%, LGE in the mid-wall apical lateral and apical septal walls. Repeated Zio patch in 07/2021 showed 5.8% PVCs, improved.   ?  ?Patient stopped [place] after 3 yeast infections.  ?  ?Patient was last seen in AHF clinic by MD on 12/06/2021.  She reported SOB walking a long distance. Reported being able to walk fine in the grocery store. Denied orthopnea/PND.  Endorsed occasional palpitations.  Weight had decreased 12 lbs from previous visit.  ? ?Today, she returns to HF clinic for pharmacist medication titration. At last visit with MD, valsartan was increased to 40 mg BID and metoprolol succinate was increased to 50 mg daily. She has brought in all her meds for review. During discussion today, she states she increased her metoprolol as prescribed, but has been splitting her valsartan 40 mg tablets and was still taking 20 mg BID due to confusion over ripped label. Overall, she is feeling fine. Denies dizziness. Reports some lightheadedness when standing but this is not new for her. Reports minor fatigue. Denies chest pain and palpitations. Breathing  has been fine. She states previously she was unable to walk around house and is currently able to walk to mailbox and back with no problems. Weight in clinic today is stable. Alternates daily doses of torsemide 40 mg and 20 mg. Reports she had cramps on torsemide 40 mg daily, but these have resolved since decreasing her dose. Denies LEE and PND. Sleeps on 2 pillows. Appetite ok. Doesn't add extra salt to food. ? ?HF Medications: ?Metoprolol succinate 50 mg daily ?Valsartan 40 mg BID - (has been taking 20 mg BID)  ?Spironolactone 25 mg daily ?Digoxin 0.125 mg daily ?Torsemide 40 mg daily - (has been alternating with 40 mg daily and 20 mg daily) ?KCl 10 mEq daily ? ?Has the patient been experiencing any side effects to the medications prescribed?   - [place]ramps on 40 mg torsemide daily - has been alternating torsemide 40 mg daily with 20 mg daily. ? ?Does the patient have any problems obtaining medications due to transportation or finances?   No - [employer] ? ?Understanding of regimen: fair ?Understanding of indications: good ?Potential of compliance: good ?Patient understands to avoid NSAIDs. ?Patient understands to avoid decongestants. ?  ? ?Pertinent Lab Values: ?12/18/2021 - Serum creatinine 0.64, BUN 14, Potassium 4.3, Sodium 134,  ?12/06/2021 - Digoxin 0.7  ? ?Vital Signs: ?Weight: 220.8 lbs (last clinic weight: 222.2 lbs) ?Blood pressure: 124/86  ?Heart rate: 85  ? ?Assessment/Plan: ?1. Chronic systolic CHF: Nonischemic cardiomyopathy.  Echo in 12/2020 with EF 30-35%, normal RV.  LHC in 08/2019  and coronary CTA in 06/2020 showed nonobstructive CAD.  [place] in 02/2021 was worrisome with R>L heart failure and low cardiac output as well as low PAPI.  Cardiac MRI in 06/2021 showed EF 40%, septal-lateral dyssynchrony, RV EF 45%, LGE in the mid-wall apical lateral and apical septal walls. This was concerning for possible myocarditis (less likely amyloidosis or sarcoidosis).  Also concern that PVCs may play a role  as Zio monitor in 05/2021 showed 21% PVCs, improved to 5.8% with amiodarone.  She has NYHA class II symptoms.  She is not volume overloaded on exam.  ?- EF is now out of ICD range, QRS not wide enough for CRT.  ?- Continue torsemide 40 mg daily alternating with 20 mg daily. Updated prescription in medication list.  ?- Continue metoprolol succinate 50 mg daily.  ?- Increase valsartan to 40 mg BID. She did not increase as instructed after last visit and has still been taking 20 mg BID. She did not tolerate Entresto due to cough.  ?- Continue spironolactone 25 mg daily.   ?- Continue dapagliflozin 10 mg daily.  ?- Continue digoxin 0.125 mg daily. ?- Continue KCl 10 mEq daily. ?2. CAD: nonobstructive by LHC in 08/2019 and coronary CTA in 06/2020.  ?- Continue atorvastatin.  ?- Continue ASA 81 daily.  ?3. PVCs: 21% PVCs on 05/2021 Zio monitor.  ?Playing role in cardiomyopathy. PVCs down to 5.8% in 07/2021 on amiodarone.  ?- Continue amiodarone 200 mg daily.  TSH mildly elevated recently, T3 and T4 WNL. Also has chronic mild elevation in LFTs (see below).  ?4. Elevated LFTs: This appears to be chronic and mild.  LFT elevation pre-dates amiodarone use, and amiodarone does not seem to have worsened LFTs.   ?- PCP has ordered abdominal US. Scheduled for colonoscopy on 01/02/2022. ?- She had workup for viral hepatitis (negative) in the past.   ?- GI is following. ?  ?Follow-up 3 weeks with APP Clinic and BMET.  ? ?Audry Riles, PharmD, BCPS, BCCP, CPP ?Heart Failure Clinic Pharmacist ?[phone] ? ? ? ? ? ? ?

## 2022-01-03 ENCOUNTER — Encounter: Payer: Self-pay | Admitting: Gastroenterology

## 2022-01-04 ENCOUNTER — Ambulatory Visit
Admission: RE | Admit: 2022-01-04 | Discharge: 2022-01-04 | Disposition: A | Discharge status: Home / Self Care | Payer: Medicare Other | Attending: Gastroenterology | Admitting: Gastroenterology

## 2022-01-04 ENCOUNTER — Ambulatory Visit: Payer: Medicare Other | Admitting: Anesthesiology

## 2022-01-04 ENCOUNTER — Encounter: Payer: Self-pay | Admitting: Gastroenterology

## 2022-01-04 ENCOUNTER — Telehealth: Payer: Self-pay

## 2022-01-04 ENCOUNTER — Encounter
Admission: RE | Disposition: A | Discharge status: Home / Self Care | Payer: Self-pay | Source: Home / Self Care | Attending: Gastroenterology

## 2022-01-04 ENCOUNTER — Ambulatory Visit: Payer: Self-pay

## 2022-01-04 DIAGNOSIS — Z86010 Personal history of colonic polyps: Secondary | ICD-10-CM | POA: Insufficient documentation

## 2022-01-04 DIAGNOSIS — D122 Benign neoplasm of ascending colon: Secondary | ICD-10-CM | POA: Diagnosis not present

## 2022-01-04 DIAGNOSIS — E119 Type 2 diabetes mellitus without complications: Secondary | ICD-10-CM | POA: Diagnosis not present

## 2022-01-04 DIAGNOSIS — I2510 Atherosclerotic heart disease of native coronary artery without angina pectoris: Secondary | ICD-10-CM | POA: Insufficient documentation

## 2022-01-04 DIAGNOSIS — D125 Benign neoplasm of sigmoid colon: Secondary | ICD-10-CM | POA: Diagnosis not present

## 2022-01-04 DIAGNOSIS — Z6841 Body Mass Index (BMI) 40.0 and over, adult: Secondary | ICD-10-CM | POA: Insufficient documentation

## 2022-01-04 DIAGNOSIS — K219 Gastro-esophageal reflux disease without esophagitis: Secondary | ICD-10-CM | POA: Insufficient documentation

## 2022-01-04 DIAGNOSIS — J449 Chronic obstructive pulmonary disease, unspecified: Secondary | ICD-10-CM | POA: Insufficient documentation

## 2022-01-04 DIAGNOSIS — I10 Essential (primary) hypertension: Secondary | ICD-10-CM | POA: Insufficient documentation

## 2022-01-04 DIAGNOSIS — E785 Hyperlipidemia, unspecified: Secondary | ICD-10-CM | POA: Insufficient documentation

## 2022-01-04 DIAGNOSIS — Z1211 Encounter for screening for malignant neoplasm of colon: Secondary | ICD-10-CM | POA: Diagnosis not present

## 2022-01-04 DIAGNOSIS — F418 Other specified anxiety disorders: Secondary | ICD-10-CM | POA: Insufficient documentation

## 2022-01-04 DIAGNOSIS — K635 Polyp of colon: Secondary | ICD-10-CM | POA: Diagnosis not present

## 2022-01-04 DIAGNOSIS — Z87891 Personal history of nicotine dependence: Secondary | ICD-10-CM | POA: Insufficient documentation

## 2022-01-04 HISTORY — PX: COLONOSCOPY WITH PROPOFOL: SHX5780

## 2022-01-04 LAB — GLUCOSE, CAPILLARY
Glucose-Capillary: 394 mg/dL — ABNORMAL HIGH (ref 70–99)
Glucose-Capillary: 367 mg/dL — ABNORMAL HIGH (ref 70–99)
Glucose-Capillary: 327 mg/dL — ABNORMAL HIGH (ref 70–99)

## 2022-01-04 SURGERY — COLONOSCOPY WITH PROPOFOL
Anesthesia: General

## 2022-01-04 MED ORDER — INSULIN ASPART 100 UNIT/ML IJ SOLN
5.0000 [IU] | Freq: Once | INTRAMUSCULAR | Status: AC
  Administered 2022-01-04: 5 [IU] via SUBCUTANEOUS

## 2022-01-04 MED ORDER — PROPOFOL 10 MG/ML IV BOLUS
INTRAVENOUS | Status: DC | PRN
  Administered 2022-01-04: 70 mg via INTRAVENOUS

## 2022-01-04 MED ORDER — LIDOCAINE HCL (CARDIAC) PF 100 MG/5ML IV SOSY
PREFILLED_SYRINGE | INTRAVENOUS | Status: DC | PRN
  Administered 2022-01-04: 50 mg via INTRAVENOUS

## 2022-01-04 MED ORDER — INSULIN ASPART 100 UNIT/ML IJ SOLN
INTRAMUSCULAR | Status: AC
  Filled 2022-01-04: qty 1

## 2022-01-04 MED ORDER — SODIUM CHLORIDE 0.9 % IV SOLN: INTRAVENOUS | Status: DC

## 2022-01-04 MED ORDER — PROPOFOL 500 MG/50ML IV EMUL
INTRAVENOUS | Status: DC | PRN
  Administered 2022-01-04: 175 ug/kg/min via INTRAVENOUS

## 2022-01-04 NOTE — Op Note (Signed)
[facility] ?Gastroenterology ?Patient Name: Kim Buchanan ?Procedure Date: 01/04/2022 11:58 AM ?MRN: [id number] ?Account #: [id number] ?Date of Birth: 1951-10-11 ?Admit Type: Outpatient ?Age: 71 ?Room: [facility] ENDO ROOM 3 ?Gender: Female ?Note Status: Finalized ?Instrument Name: Colonoscope [id number] ?Procedure:             Colonoscopy ?Indications:           High risk colon cancer surveillance: Personal history  ?                       of colonic polyps ?Providers:             Lucilla Lame MD, MD ?Medicines:             Propofol per Anesthesia ?Complications:         No immediate complications. ?Procedure:             Pre-Anesthesia Assessment: ?                       - Prior to the procedure, a History and Physical was  ?                       performed, and patient medications and allergies were  ?                       reviewed. The patient's tolerance of previous  ?                       anesthesia was also reviewed. The risks and benefits  ?                       of the procedure and the sedation options and risks  ?                       were discussed with the patient. All questions were  ?                       answered, and informed consent was obtained. Prior  ?                       Anticoagulants: The patient has taken no previous  ?                       anticoagulant or antiplatelet agents. ASA Grade  ?                       Assessment: II - A patient with mild systemic disease.  ?                       After reviewing the risks and benefits, the patient  ?                       was deemed in satisfactory condition to undergo the  ?                       procedure. ?                       After obtaining informed consent, the colonoscope was  ?  passed under direct vision. Throughout the procedure,  ?                       the patient's blood pressure, pulse, and oxygen  ?                       saturations were monitored continuously. The  ?                        Colonoscope was introduced through the anus and  ?                       advanced to the the cecum, identified by appendiceal  ?                       orifice and ileocecal valve. The colonoscopy was  ?                       performed without difficulty. The patient tolerated  ?                       the procedure well. The quality of the bowel  ?                       preparation was excellent. ?Findings: ?     The perianal and digital rectal examinations were normal. ?     Two sessile polyps were found in the ascending colon. The polyps were 4  ?     to 5 mm in size. These polyps were removed with a cold biopsy forceps.  ?     Resection and retrieval were complete. ?     Two sessile polyps were found in the sigmoid colon. The polyps were 2 to  ?     3 mm in size. These polyps were removed with a cold biopsy forceps.  ?     Resection and retrieval were complete. ?Impression:            - Two 4 to 5 mm polyps in the ascending colon, removed  ?                       with a cold biopsy forceps. Resected and retrieved. ?                       - Two 2 to 3 mm polyps in the sigmoid colon, removed  ?                       with a cold biopsy forceps. Resected and retrieved. ?Recommendation:        - Discharge patient to home. ?                       - Resume previous diet. ?                       - Continue present medications. ?                       - Await pathology results. ?                       -  Repeat colonoscopy in 5 years for surveillance. ?Procedure Code(s):     --- Professional --- ?                       [phone], Colonoscopy, flexible; with biopsy, single or  ?                       multiple ?Diagnosis Code(s):     --- Professional --- ?                       Z86.010, Personal history of colonic polyps ?                       K63.5, Polyp of colon ?CPT copyright 2019 American Medical Association. All rights reserved. ?The codes documented in this report are preliminary and upon coder review may  ?be revised to  meet current compliance requirements. ?Lucilla Lame MD, MD ?01/04/2022 12:22:46 PM ?This report has been signed electronically. ?Number of Addenda: 0 ?Note Initiated On: 01/04/2022 11:58 AM ?Scope Withdrawal Time: 0 hours 10 minutes 42 seconds  ?Total Procedure Duration: 0 hours 17 minutes 12 seconds  ?Estimated Blood Loss:  Estimated blood loss: none. ?     [facility] ?

## 2022-01-04 NOTE — Chronic Care Management (AMB) (Signed)
?  Care Management  ? ?Note ? ?01/04/2022 ?Name: Kim Buchanan MRN: [id number] DOB: Feb 28, 1951 ? ?Kim Buchanan is a 71 y.o. year old female who is a primary care patient of [name], NP and is actively engaged with the care management team. I reached out to [name] by phone today to assist with re-scheduling a follow up visit with the Pharmacist ? ?Follow up plan: ?Telephone appointment with care management team member scheduled for: 03/12/2022 ? ? ?Noreene Larsson, RMA ?Care Guide, Embedded Care Coordination ?[facility]  ?[place], [place] [address] ?Direct Dial: [phone] ?Museum/[email or web address] ?[email or web address]: [email or web address]  ? ?

## 2022-01-04 NOTE — Anesthesia Procedure Notes (Signed)
Date/Time: 01/04/2022 12:00 PM ?Performed by: Johnna Acosta, CRNA ?Pre-anesthesia Checklist: Patient identified, Emergency Drugs available, Suction available, Patient being monitored and Timeout performed ?Patient Re-evaluated:Patient Re-evaluated prior to induction ?Oxygen Delivery Method: Nasal cannula ?Preoxygenation: Pre-oxygenation with 100% oxygen ?Induction Type: IV induction ? ? ? ? ?

## 2022-01-04 NOTE — Telephone Encounter (Signed)
Summary: BS is 329  ? Pt called in states BS is 329, please call back.   ?  ? ?Chief Complaint: Pt. Reports she had a colonoscopy today and her glucose was "high and they had to give me insulin." States it was 395, 329. States she did take her medication this morning.Home now and "I feel fine." ?Symptoms: None ?Frequency: Today ?Pertinent Negatives: Patient denies any symptoms ?Disposition: '[]'$ ED /'[]'$ Urgent Care (no appt availability in office) / '[]'$ Appointment(In office/virtual)/ '[]'$  [place] [facility]/ '[]'$ Home Care/ '[]'$ Refused Recommended Disposition /'[]'$ [facility]/ '[x]'$  Follow-up with PCP ?Additional Notes: Please advise pt.   ?Answer Assessment - Initial Assessment Questions ?1. BLOOD GLUCOSE: "What is your blood glucose level?"  ?    395  329 ?2. ONSET: "When did you check the blood glucose?" ?    Today ?3. USUAL RANGE: "What is your glucose level usually?" (e.g., usual fasting morning value, usual evening value) ?    Does not check at home ?4. KETONES: "Do you check for ketones (urine or blood test strips)?" If yes, ask: "What does the test show now?"  ?    No ?5. TYPE 1 or 2:  "Do you know what type of diabetes you have?"  (e.g., Type 1, Type 2, Gestational; doesn't know)  ?    Type 2  ?6. INSULIN: "Do you take insulin?" "What type of insulin(s) do you use? What is the mode of delivery? (syringe, pen; injection or pump)?"  ?    No ?7. DIABETES PILLS: "Do you take any pills for your diabetes?" If yes, ask: "Have you missed taking any pills recently?" ?    Yes ?8. OTHER SYMPTOMS: "Do you have any symptoms?" (e.g., fever, frequent urination, difficulty breathing, dizziness, weakness, vomiting) ?    Feels fine ?9. PREGNANCY: "Is there any chance you are pregnant?" "When was your last menstrual period?" ?    No ? ?Protocols used: Diabetes - High Blood Sugar-A-AH ? ?

## 2022-01-04 NOTE — Transfer of Care (Signed)
Immediate Anesthesia Transfer of Care Note ? ?Patient: Kim Buchanan ? ?Procedure(s) Performed: COLONOSCOPY WITH PROPOFOL ? ?Patient Location: PACU and Endoscopy Unit ? ?Anesthesia Type:General ? ?Level of Consciousness: drowsy and patient cooperative ? ?Airway & Oxygen Therapy: Patient Spontanous Breathing ? ?Post-op Assessment: Report given to RN and Post -op Vital signs reviewed and stable ? ?Post vital signs: Reviewed and stable ? ?Last Vitals:  ?Vitals Value Taken Time  ?BP 106/55 01/04/22 1226  ?Temp    ?Pulse 70 01/04/22 1227  ?Resp 25 01/04/22 1227  ?SpO2 94 % 01/04/22 1227  ?Vitals shown include unvalidated device data. ? ?Last Pain:  ?Vitals:  ? 01/04/22 1055  ?TempSrc: Temporal  ?   ? ?  ? ?Complications: No notable events documented. ?

## 2022-01-04 NOTE — Anesthesia Preprocedure Evaluation (Signed)
Anesthesia Evaluation  ?Patient identified by MRN, date of birth, ID band ?Patient awake ? ? ? ?Reviewed: ?Allergy & Precautions, H&P , NPO status , Patient's Chart, lab work & pertinent test results, reviewed documented beta blocker date and time  ? ?Airway ?Mallampati: III ? ? ?Neck ROM: full ? ? ? Dental ? ?(+) Poor Dentition ?  ?Pulmonary ?neg pulmonary ROS, sleep apnea , COPD,  COPD inhaler, former smoker,  ?  ?Pulmonary exam normal ? ? ? ? ? ? ? Cardiovascular ?Exercise Tolerance: Poor ?hypertension, On Medications ?+ CAD  ?negative cardio ROS ?Normal cardiovascular exam ?Rhythm:regular Rate:Normal ? ? ?  ?Neuro/Psych ?PSYCHIATRIC DISORDERS Anxiety Depression  Neuromuscular disease negative neurological ROS ? negative psych ROS  ? GI/Hepatic ?negative GI ROS, Neg liver ROS, GERD  Medicated,  ?Endo/Other  ?negative endocrine ROSdiabetes ? Renal/GU ?negative Renal ROS  ?negative genitourinary ?  ?Musculoskeletal ? ? Abdominal ?  ?Peds ? Hematology ?negative hematology ROS ?(+) Blood dyscrasia, anemia ,   ?Anesthesia Other Findings ?Past Medical History: ?No date: Anxiety ?No date: Arthritis ?    Comment:  lower back ?No date: COPD (chronic obstructive pulmonary disease) ([place]) ?No date: Depression ?No date: Diabetes mellitus without complication ([place]) ?No date: GERD (gastroesophageal reflux disease) ?No date: Hyperlipidemia ?No date: Morbid obesity ([place]) ?No date: NICM (nonischemic cardiomyopathy) ([place]) ?    Comment:  a. 03/2019 Echo: Nl LVEF; b. 09/04/2019 Echo: EF 25-30%;  ?             c. 09/11/2019 Cath: nonobs LAD/RCA dzs. EF 50-55%; c.  ?             12/2020 Echo: EF 30-35%, glob HK. Mild LVH. Nl RVSP. Mild  ?             MR; d. 02/2021 RHC: Elev RH pressures. CO/CI 2.8/1.3; e.  ?             06/2021 cMRI: EF 40%, marked sept-lat dyssynch & septal  ?             HK. Small, discrete areas of mid-wall LGE in apical  ?             lateral and apical septal walls - ? h/o  myocarditis. ?No date: Nonobstructive CAD (coronary artery disease) ?    Comment:  a. 03/2019 MV [facility]s): EF 63%, ant ischemia; b. 08/2019  ?             Cath: LM nl, LAD 87m RCA 70p (iFR nl @ 0.97), 50p. EF  ?             50-55%-->Med Rx; c. 06/2020 Cor CTA: Nl FFRs throughout  ?             cor tree. ?No date: Osteoporosis ?No date: Panic attack ?No date: PTSD (post-traumatic stress disorder) ?No date: PVC's (premature ventricular contractions) ?    Comment:  a. 05/2021 Zio: 20.6% PVC burden. 13.4 secs of  ?             NSVT-->amio added; b. 06/2021 Zio: 5.8% PVC burden. Rare  ?             supraventrciular ectopy. ?No date: Rectal fistula ?No date: Sleep apnea ?    Comment:  a. Prev wore CPAP but lost insurance and hasn't been  ?             using since (now has insurance). ?No date: Wears dentures ?  Comment:  full upper ?Past Surgical History: ?No date: CARDIAC CATHETERIZATION ?2014: COLONOSCOPY ?10/01/2016: COLONOSCOPY WITH PROPOFOL; N/A ?    Comment:  Procedure: COLONOSCOPY WITH PROPOFOL;  Surgeon: Evangeline Gula  ?             Allen Norris, MD;  Location: [place];  Service:  ?             Endoscopy;  Laterality: N/A; ?09/11/2019: CORONARY PRESSURE WIRE/FFR WITH 3D MAPPING; N/A ?    Comment:  Procedure: Coronary Pressure Wire/FFR w/3D Mapping;   ?             Surgeon: Nelva Bush, MD;  Location: [place]  ?             CV LAB;  Service: Cardiovascular;  Laterality: N/A; ?12/17/2016: CYSTOCELE REPAIR; N/A ?    Comment:  Procedure: ANTERIOR REPAIR (CYSTOCELE);  Surgeon: Hassell Done ?             A Defrancesco, MD;  Location: [facility] ORS;  Service:  ?             Gynecology;  Laterality: N/A; ?08/11/2019: ESOPHAGOGASTRODUODENOSCOPY (EGD) WITH PROPOFOL; N/A ?    Comment:  Procedure: ESOPHAGOGASTRODUODENOSCOPY (EGD) WITH  ?             PROPOFOL;  Surgeon: Lucilla Lame, MD;  Location: [facility]  ?             ENDOSCOPY;  Service: Endoscopy;  Laterality: N/A; ?10/01/2016: POLYPECTOMY ?    Comment:  Procedure: POLYPECTOMY;   Surgeon: Lucilla Lame, MD;   ?             Location: [place];  Service: Endoscopy;; ?06/28/2015: RECTAL SURGERY ?    Comment:  Rectal prolapse, laparoscopic rectopexy the  ?             coldCharmont, MD; [facility] ?03/21/2021: RIGHT HEART CATH; N/A ?    Comment:  Procedure: RIGHT HEART CATH;  Surgeon: Nelva Bush, ?             MD;  Location: [place] CV LAB;  Service:  ?             Cardiovascular;  Laterality: N/A; ?09/11/2019: RIGHT/LEFT HEART CATH AND CORONARY ANGIOGRAPHY; Bilateral ?    Comment:  Procedure: RIGHT/LEFT HEART CATH AND CORONARY  ?             ANGIOGRAPHY;  Surgeon: [name], MD;  Location:  ?             [place] CV LAB;  Service: Cardiovascular;   ?             Laterality: Bilateral; ?No date: TOTAL ABDOMINAL HYSTERECTOMY W/ BILATERAL SALPINGOOPHORECTOMY ?No date: TUBAL LIGATION ?12/17/2016: VAGINAL HYSTERECTOMY; Bilateral ?    Comment:  Procedure: HYSTERECTOMY VAGINAL WITH BILATERAL SALPINGO  ?             OOPHERECTOMY;  Surgeon: Brayton Mars, MD;   ?             Location: [facility];  Service: Gynecology;  Laterality:  ?             Bilateral; ?BMI   ? Body Mass Index: 40.06 kg/m?  ?  ? Reproductive/Obstetrics ?negative OB ROS ? ?  ? ? ? ? ? ? ? ? ? ? ? ? ? ?  ?  ? ? ? ? ? ? ? ? ?Anesthesia Physical ?Anesthesia Plan ? ?  ASA: 3 ? ?Anesthesia Plan: General  ? ?Post-op Pain Management:   ? ?Induction:  ? ?PONV Risk Score and Plan:  ? ?Airway Management Planned:  ? ?Additional Equipment:  ? ?Intra-op Plan:  ? ?Post-operative Plan:  ? ?Informed Consent: I have reviewed the patients History and Physical, chart, labs and discussed the procedure including the risks, benefits and alternatives for the proposed anesthesia with the patient or authorized representative who has indicated his/her understanding and acceptance.  ? ? ? ?Dental Advisory Given ? ?Plan Discussed with: CRNA ? ?Anesthesia Plan Comments:   ? ? ? ? ? ? ?Anesthesia Quick Evaluation ? ?

## 2022-01-04 NOTE — Telephone Encounter (Signed)
Returned patient phone call, patient states she feels fine. Patient checked BS while on the phone and it read 459. Advised patient I will send message to provider and we will follow up with her based on provider recommendation.  ?

## 2022-01-04 NOTE — Progress Notes (Signed)
I have contacted [facility] about an update on patients Trulicity '3mg'$  medication. They stated for me to reach out to [facility], I have contacted them & they state due to them receiving the prescription recently on 01/01/22 they are still on process with sending out the medication. I attempted to reach out to the patient to give her an update on her medication but I left her a voicemail. ? ?Kim Buchanan, RMA ?Health Concierge ? ?

## 2022-01-04 NOTE — H&P (Signed)
? ?Lucilla Lame, MD [facility] ?[address] ?[place], [place] [address] ?Phone:[phone] ?Fax : [phone] ? ?Primary Care Physician:  [name], NP ?Primary Gastroenterologist:  Dr. Allen Norris ? ?Pre-Procedure History & Physical: ?HPI:  Kim Buchanan is a 71 y.o. female is here for an colonoscopy. ?  ?Past Medical History:  ?Diagnosis Date  ? Anxiety   ? Arthritis   ? lower back  ? COPD (chronic obstructive pulmonary disease) ([place])   ? Depression   ? Diabetes mellitus without complication ([place])   ? GERD (gastroesophageal reflux disease)   ? Hyperlipidemia   ? Morbid obesity ([place])   ? NICM (nonischemic cardiomyopathy) ([place])   ? a. 03/2019 Echo: Nl LVEF; b. 09/04/2019 Echo: EF 25-30%; c. 09/11/2019 Cath: nonobs LAD/RCA dzs. EF 50-55%; c. 12/2020 Echo: EF 30-35%, glob HK. Mild LVH. Nl RVSP. Mild MR; d. 02/2021 RHC: Elev RH pressures. CO/CI 2.8/1.3; e. 06/2021 cMRI: EF 40%, marked sept-lat dyssynch & septal HK. Small, discrete areas of mid-wall LGE in apical lateral and apical septal walls - ? h/o myocarditis.  ? Nonobstructive CAD (coronary artery disease)   ? a. 03/2019 MV [facility]): EF 63%, ant ischemia; b. 08/2019 Cath: LM nl, LAD 68m RCA 70p (iFR nl @ 0.97), 50p. EF 50-55%-->Med Rx; c. 06/2020 Cor CTA: Nl FFRs throughout cor tree.  ? Osteoporosis   ? Panic attack   ? PTSD (post-traumatic stress disorder)   ? PVC's (premature ventricular contractions)   ? a. 05/2021 Zio: 20.6% PVC burden. 13.4 secs of NSVT-->amio added; b. 06/2021 Zio: 5.8% PVC burden. Rare supraventrciular ectopy.  ? Rectal fistula   ? Sleep apnea   ? a. Prev wore CPAP but lost insurance and hasn't been using since (now has insurance).  ? Wears dentures   ? full upper  ? ? ?Past Surgical History:  ?Procedure Laterality Date  ? CARDIAC CATHETERIZATION    ? COLONOSCOPY  2014  ? COLONOSCOPY WITH PROPOFOL N/A 10/01/2016  ? Procedure: COLONOSCOPY WITH PROPOFOL;  Surgeon: DLucilla Lame MD;  [place]  Service: Endoscopy;   Laterality: N/A;  ? CORONARY PRESSURE WIRE/FFR WITH 3D MAPPING N/A 09/11/2019  ? Procedure: Coronary Pressure Wire/FFR w/3D Mapping;  Surgeon: ENelva Bush MD;  [facility];  Service: Cardiovascular;  Laterality: N/A;  ? CYSTOCELE REPAIR N/A 12/17/2016  ? Procedure: ANTERIOR REPAIR (CYSTOCELE);  Surgeon: MBrayton Mars MD;  Location: [facility];  Service: Gynecology;  Laterality: N/A;  ? ESOPHAGOGASTRODUODENOSCOPY (EGD) WITH PROPOFOL N/A 08/11/2019  ? Procedure: ESOPHAGOGASTRODUODENOSCOPY (EGD) WITH PROPOFOL;  Surgeon: WLucilla Lame MD;  [facility]  Service: Endoscopy;  Laterality: N/A;  ? POLYPECTOMY  10/01/2016  ? Procedure: POLYPECTOMY;  Surgeon: DLucilla Lame MD;  [place] [facility]  Service: Endoscopy;;  ? RECTAL SURGERY  06/28/2015  ? Rectal prolapse, laparoscopic rectopexy the coldCharmont, MD; [facility]  ? RIGHT HEART CATH N/A 03/21/2021  ? Procedure: RIGHT HEART CATH;  Surgeon: ENelva Bush MD;  [facility];  Service: Cardiovascular;  Laterality: N/A;  ? RIGHT/LEFT HEART CATH AND CORONARY ANGIOGRAPHY Bilateral 09/11/2019  ? Procedure: RIGHT/LEFT HEART CATH AND CORONARY ANGIOGRAPHY;  Surgeon: GMinna Merritts MD;  [facility];  Service: Cardiovascular;  Laterality: Bilateral;  ? TOTAL ABDOMINAL HYSTERECTOMY W/ BILATERAL SALPINGOOPHORECTOMY    ? TUBAL LIGATION    ? VAGINAL HYSTERECTOMY Bilateral 12/17/2016  ? Procedure: HYSTERECTOMY VAGINAL WITH BILATERAL SALPINGO OOPHERECTOMY;  Surgeon: MBrayton Mars MD;  Location: [facility];  Service: Gynecology;  Laterality: Bilateral;  ? ? ?Prior to Admission medications   ?Medication Sig Start Date End Date Taking? Authorizing Provider  ?amiodarone (PACERONE) 200 MG tablet Take 1 tablet (200 mg total) by mouth daily. 12/28/21  Yes Larey Dresser, MD  ?aspirin 81 MG tablet Take 81 mg by mouth daily.    Yes [provider]  ?atorvastatin (LIPITOR) 10 MG tablet Take  1 tablet (10 mg total) by mouth at bedtime. 12/28/21  Yes Larey Dresser, MD  ?digoxin (LANOXIN) 0.125 MG tablet Take 1 tablet (0.125 mg total) by mouth daily. 07/27/21  Yes Larey Dresser, MD  ?Dulaglutide (TRULICITY) 1.69 YO/3.7CH SOPN Inject 1.5 mg into the skin once a week. 06/13/21  Yes [name], NP  ?gabapentin (NEURONTIN) 300 MG capsule Take 1 capsule (300 mg total) by mouth 3 (three) times daily. 01/26/21  Yes [name], NP  ?metFORMIN (GLUCOPHAGE) 1000 MG tablet TAKE 1 TABLET BY MOUTH  TWICE DAILY WITH MEALS 11/15/21  Yes [name], NP  ?metoprolol succinate (TOPROL-XL) 50 MG 24 hr tablet Take 1 tablet (50 mg total) by mouth daily. 12/28/21  Yes Larey Dresser, MD  ?pantoprazole (PROTONIX) 40 MG tablet Take 1 tablet (40 mg total) by mouth daily. 12/09/20  Yes Cannady, Jolene T, NP  ?potassium chloride (KLOR-CON) 10 MEQ tablet Take 1 tablet (10 mEq total) by mouth as directed. Take with your Torsemide. 01/27/21 05/22/22 Yes [name], Areta Haber, PA-C  ?QUEtiapine (SEROQUEL) 200 MG tablet Take 1 tablet (200 mg total) by mouth daily. Patient takes at bed time. 08/25/21  Yes [name], NP  ?torsemide (DEMADEX) 20 MG tablet Take torsemide 40 mg (2 tabs) daily alternating with 20 mg (1 tab) daily 12/28/21  Yes Larey Dresser, MD  ?valsartan (DIOVAN) 40 MG tablet Take 1 tablet (40 mg total) by mouth 2 (two) times daily. 12/28/21  Yes Larey Dresser, MD  ?blood glucose meter kit and supplies Dispense based on patient and insurance preference. Check fasting blood sugar once daily. (FOR ICD E11.69). 03/08/20   Virginia Crews, MD  ?Lancets (ONETOUCH DELICA PLUS [id number]) [place] TEST ONCE DAILY 09/19/21   [name], NP  ?ONETOUCH ULTRA test strip TEST ONCE DAILY 09/19/21   [name], NP  ?polyethylene glycol powder (GLYCOLAX/MIRALAX) 17 GM/SCOOP powder Take 17 g by mouth daily. 07/14/15   [provider]  ?spironolactone (ALDACTONE) 25 MG tablet Take 1 tablet (25 mg total) by  mouth daily. 12/06/21   Larey Dresser, MD  ? ? ?Allergies as of 11/28/2021 - Review Complete 11/24/2021  ?Allergen Reaction Noted  ? Entresto [sacubitril-valsartan]  09/07/2021  ? Lisinopril Cough 06/05/2019  ? ? ?Family History  ?Problem Relation Age of Onset  ? Diabetes Mother   ? Diabetes Sister   ? Cancer Sister   ? Diabetes Brother   ? Lymphoma Brother   ? Heart disease Father   ? Hypertension Father   ? Diabetes Sister   ? Diabetes Brother   ? Lymphoma Brother   ? Heart disease Brother   ? Healthy Sister   ? Breast cancer Neg Hx   ? Ovarian cancer Neg Hx   ? Colon cancer Neg Hx   ? ? ?Social History  ? ?Socioeconomic History  ? Marital status: Married  ?  Spouse name: Phineas Douglas  ? Number of children: 1  ? Years of education: Not on file  ? Highest education level: 9th grade  ?Occupational History  ?  Employer:  DISABLED  ?  Comment: for panic attacks  ?Tobacco Use  ? Smoking status: Former  ?  Packs/day: 1.50  ?  Years: 50.00  ?  Pack years: 75.00  ?  Types: Cigarettes  ?  Quit date: 11/29/2018  ?  Years since quitting: 3.1  ? Smokeless tobacco: Never  ? Tobacco comments:  ?  started at age 55  ?Vaping Use  ? Vaping Use: Never used  ?Substance and Sexual Activity  ? Alcohol use: No  ?  Alcohol/week: 0.0 standard drinks  ? Drug use: No  ? Sexual activity: Not Currently  ?Other Topics Concern  ? Not on file  ?Social History Narrative  ? Pt had one son, who died when was 63 YO in a MVA.  ? ?Social Determinants of Health  ? ?Financial Resource Strain: Low Risk   ? Difficulty of Paying Living Expenses: Not hard at all  ?Food Insecurity: No Food Insecurity  ? Worried About Charity fundraiser in the Last Year: Never true  ? Ran Out of Food in the Last Year: Never true  ?Transportation Needs: No Transportation Needs  ? Lack of Transportation (Medical): No  ? Lack of Transportation (Non-Medical): No  ?Physical Activity: Inactive  ? Days of Exercise per Week: 0 days  ? Minutes of Exercise per Session: 0 min  ?Stress: No  Stress Concern Present  ? Feeling of Stress : Not at all  ?Social Connections: Socially Isolated  ? Frequency of Communication with Friends and Family: Once a week  ? Frequency of Social Gatherings with Rosilyn Mings

## 2022-01-05 ENCOUNTER — Encounter: Payer: Self-pay | Admitting: Gastroenterology

## 2022-01-05 LAB — SURGICAL PATHOLOGY

## 2022-01-05 NOTE — Telephone Encounter (Signed)
Please find out the dose of Trulicity patient is taking.  Make her an appointment to see me next week.   ? ?Please let her know that if she develops symptoms of hyperglycemia such as increased thirstiness, dry mouth, blurred vision, and increasing fatigue she needs to be seen in the ER. ?

## 2022-01-05 NOTE — Telephone Encounter (Signed)
Kim Buchanan, what do I need to do to increase Kim Buchanan's trulicty to '3mg'$ ? ?

## 2022-01-05 NOTE — Telephone Encounter (Signed)
Patient takes 1.'5mg'$  of Trulicity, scheduled patient appointment for Monday at 10:20. Patient is aware of provider recommendation when to go to ED, patient verbalized understanding.  ?

## 2022-01-05 NOTE — Telephone Encounter (Signed)
Please find out how patient is feeling today.  See if she has checked her sugar this morning and if she took her medication? ?

## 2022-01-05 NOTE — Telephone Encounter (Signed)
Patient checked sugar while on the phone and it was 442 this morning, states she feels fine. Patient states she has been taking her medication, takes Trulicity on Tuesday's.  ?

## 2022-01-08 ENCOUNTER — Ambulatory Visit (INDEPENDENT_AMBULATORY_CARE_PROVIDER_SITE_OTHER): Payer: Medicare Other | Admitting: Nurse Practitioner

## 2022-01-08 ENCOUNTER — Encounter: Payer: Self-pay | Admitting: Nurse Practitioner

## 2022-01-08 ENCOUNTER — Other Ambulatory Visit: Payer: Self-pay

## 2022-01-08 ENCOUNTER — Encounter: Payer: Self-pay | Admitting: Gastroenterology

## 2022-01-08 DIAGNOSIS — E1142 Type 2 diabetes mellitus with diabetic polyneuropathy: Secondary | ICD-10-CM | POA: Diagnosis not present

## 2022-01-08 NOTE — Progress Notes (Signed)
? ?BP 136/77   Pulse 73   Temp 97.8 ?F (36.6 ?C) (Oral)   Wt 220 lb 9.6 oz (100.1 kg)   SpO2 96%   BMI 40.35 kg/m?   ? ?Subjective:  ? ? Patient ID: Kim Buchanan, female    DOB: Apr 17, 1951, 71 y.o.   MRN: [id number] ? ?HPI: ?Kim Buchanan is a 71 y.o. female ? ?Chief Complaint  ?Patient presents with  ? Hyperglycemia  ?  Pt states she has been getting elevated BS readings for the past 5 days. States her fasting BS this morning was 218, other mornings have been elevated as well. States that her readings have been in the 400's during the day when checked.   ? ?DIABETES ?Pt states she has been getting elevated BS readings for the past 5 days. States her fasting BS this morning was 218, other mornings have been elevated as well. States that her readings have been in the 400's during the day when checked. Denies any symptoms of hyperglycemia. She does eat a lot of bread and stays hungry throughout the day.  ? ? ?Relevant past medical, surgical, family and social history reviewed and updated as indicated. Interim medical history since our last visit reviewed. ?Allergies and medications reviewed and updated. ? ?Review of Systems  ?Constitutional:  Negative for fatigue.  ?Endocrine: Negative for polydipsia and polyuria.  ?Neurological:  Negative for light-headedness.  ? ?Per HPI unless specifically indicated above ? ?   ?Objective:  ?  ?BP 136/77   Pulse 73   Temp 97.8 ?F (36.6 ?C) (Oral)   Wt 220 lb 9.6 oz (100.1 kg)   SpO2 96%   BMI 40.35 kg/m?   ?Wt Readings from Last 3 Encounters:  ?01/08/22 220 lb 9.6 oz (100.1 kg)  ?01/04/22 219 lb (99.3 kg)  ?12/28/21 220 lb 12.8 oz (100.2 kg)  ?  ?Physical Exam ?Vitals and nursing note reviewed.  ?Constitutional:   ?   General: She is not in acute distress. ?   Appearance: Normal appearance. She is obese. She is not ill-appearing, toxic-appearing or diaphoretic.  ?HENT:  ?   Head: Normocephalic.  ?   Right Ear: External ear normal.  ?   Left Ear: External ear normal.   ?   Nose: Nose normal.  ?   Mouth/Throat:  ?   Mouth: Mucous membranes are moist.  ?   Pharynx: Oropharynx is clear.  ?Eyes:  ?   General:     ?   Right eye: No discharge.     ?   Left eye: No discharge.  ?   Extraocular Movements: Extraocular movements intact.  ?   Conjunctiva/sclera: Conjunctivae normal.  ?   Pupils: Pupils are equal, round, and reactive to light.  ?Cardiovascular:  ?   Rate and Rhythm: Normal rate and regular rhythm.  ?   Heart sounds: No murmur heard. ?Pulmonary:  ?   Effort: Pulmonary effort is normal. No respiratory distress.  ?   Breath sounds: Normal breath sounds. No wheezing or rales.  ?Musculoskeletal:  ?   Cervical back: Normal range of motion and neck supple.  ?Skin: ?   General: Skin is warm and dry.  ?   Capillary Refill: Capillary refill takes less than 2 seconds.  ?Neurological:  ?   General: No focal deficit present.  ?   Mental Status: She is alert and oriented to person, place, and time. Mental status is at baseline.  ?Psychiatric:     ?  Mood and Affect: Mood normal.     ?   Behavior: Behavior normal.     ?   Thought Content: Thought content normal.     ?   Judgment: Judgment normal.  ? ? ?Results for orders placed or performed during the hospital encounter of 01/04/22  ?Glucose, capillary  ?Result Value Ref Range  ? Glucose-Capillary 394 (H) 70 - 99 mg/dL  ?Glucose, capillary  ?Result Value Ref Range  ? Glucose-Capillary 367 (H) 70 - 99 mg/dL  ?Glucose, capillary  ?Result Value Ref Range  ? Glucose-Capillary 327 (H) 70 - 99 mg/dL  ?Surgical pathology  ?Result Value Ref Range  ? SURGICAL PATHOLOGY    ?  SURGICAL PATHOLOGY ?CASE: [id number] ?PATIENT: Lillyann Cygan ?Surgical Pathology Report ? ? ? ? ?Specimen Submitted: ?A. Colon polyp x2, ascending; cbx ?B. Colon polyp x2, sigmoid; cbx ? ?Clinical History: History of colonic polyps Z86.010.  Colon polyps ? ? ? ?DIAGNOSIS: ?A. COLON POLYPS X2, ASCENDING; COLD BIOPSY: ?- FRAGMENTS (X4) OF TUBULAR ADENOMAS. ?- NEGATIVE FOR  HIGH-GRADE DYSPLASIA AND MALIGNANCY. ? ?B. COLON POLYPS X2, SIGMOID; COLD BIOPSY: ?- FRAGMENTS (X3) OF HYPERPLASTIC POLYPS. ?- NEGATIVE FOR DYSPLASIA AND MALIGNANCY. ? ?GROSS DESCRIPTION: ?A. Labeled: Cbx ascending colon polyp x2 ?Received: Formalin ?Collection time: 12:11 PM on 01/04/2022 ?Placed into formalin time: 12:11 PM on 01/04/2022 ?Tissue fragment(s): Multiple ?Size: Aggregate, 0.7 x 0.4 x 0.1 cm ?Description: Tan soft tissue fragments ?Entirely submitted in 1 cassette. ? ?B. Labeled: Cbx sigmoid colon polyp x2 ?Received: Formalin ?Collection time: 12:18 PM on 01/04/2022 ?Placed into formalin time: 12:18 PM on  01/04/2022 ?Tissue fragment(s): 3 ?Size: Aggregate, 0.9 x 0.6 x 0.1 cm ?Description: Tan soft tissue fragments ?Entirely submitted in 1 cassette. ? ?CM 01/04/2022 ? ?Final Diagnosis performed by Allena Napoleon, MD.   Electronically signed ?01/05/2022 10:06:24AM ?The electronic signature indicates that the named Attending Pathologist ?has evaluated the specimen ?Technical component performed at [facility], [address] [place], ?[place] [address] Lab: [phone] Dir: Rush Farmer, MD, MMM ? Professional component performed at [facility], [facility], [place], [place], [place] [address] Lab: [phone] ?Dir: Kathi Simpers, MD ?  ? ?   ?Assessment & Plan:  ? ?Problem List Items Addressed This Visit   ? ?  ? Endocrine  ? Type 2 diabetes mellitus with peripheral neuropathy (HCC)  ?  Chronic. Not well controlled.  Have spoken with clinical pharmacist who has sent in paperwork to increase her dose of Trulicity to '3mg'$ .  Lengthy discussion had with patient regarding bread intake as well as carbohydrates.  Patient does not want to go to diabetes education at this time. Discussed increasing protein and fat intake and decreasing carbohydrates to help with hunger.  Follow up in 1 month.  Will likely increase Trulicity to 4.'5mg'$  weekly at that time. ?  ?  ?  ? ?Follow up plan: ?Return in about 5  weeks (around 02/12/2022) for BS check. ? ? ?A total of 30 minutes were spent on this encounter today.  When total time is documented, this includes both the face-to-face and non-face-to-face time personally spent before, during and after the visit on the date of the encounter discussing increased dose of Trulicity and changes to diet to help with diabetes..  ? ? ? ? ?

## 2022-01-08 NOTE — Assessment & Plan Note (Signed)
Chronic. Not well controlled.  Have spoken with clinical pharmacist who has sent in paperwork to increase her dose of Trulicity to '3mg'$ .  Lengthy discussion had with patient regarding bread intake as well as carbohydrates.  Patient does not want to go to diabetes education at this time. Discussed increasing protein and fat intake and decreasing carbohydrates to help with hunger.  Follow up in 1 month.  Will likely increase Trulicity to 4.'5mg'$  weekly at that time. ?

## 2022-01-10 ENCOUNTER — Other Ambulatory Visit: Payer: Self-pay | Admitting: Nurse Practitioner

## 2022-01-10 NOTE — Anesthesia Postprocedure Evaluation (Signed)
Anesthesia Post Note ? ?Patient: Kim Buchanan ? ?Procedure(s) Performed: COLONOSCOPY WITH PROPOFOL ? ?Patient location during evaluation: PACU ?Anesthesia Type: General ?Level of consciousness: awake and alert ?Pain management: pain level controlled ?Vital Signs Assessment: post-procedure vital signs reviewed and stable ?Respiratory status: spontaneous breathing, nonlabored ventilation, respiratory function stable and patient connected to nasal cannula oxygen ?Cardiovascular status: blood pressure returned to baseline and stable ?Postop Assessment: no apparent nausea or vomiting ?Anesthetic complications: no ? ? ?No notable events documented. ? ? ?Last Vitals:  ?Vitals:  ? 01/04/22 1236 01/04/22 1246  ?BP: 111/84 105/87  ?Pulse: 70 65  ?Resp: (!) 22 19  ?Temp:    ?SpO2: 98% 99%  ?  ?Last Pain:  ?Vitals:  ? 01/04/22 1246  ?TempSrc:   ?PainSc: 0-No pain  ? ? ?  ?  ?  ?  ?  ?  ? ?Molli Barrows ? ? ? ? ?

## 2022-01-11 ENCOUNTER — Other Ambulatory Visit: Payer: Self-pay | Admitting: Gastroenterology

## 2022-01-11 ENCOUNTER — Other Ambulatory Visit: Payer: Self-pay

## 2022-01-11 ENCOUNTER — Ambulatory Visit (INDEPENDENT_AMBULATORY_CARE_PROVIDER_SITE_OTHER): Payer: Medicare Other | Admitting: Gastroenterology

## 2022-01-11 ENCOUNTER — Encounter: Payer: Self-pay | Admitting: Gastroenterology

## 2022-01-11 VITALS — BP 114/73 | HR 78 | Temp 98.3°F | Wt 220.0 lb

## 2022-01-11 DIAGNOSIS — R748 Abnormal levels of other serum enzymes: Secondary | ICD-10-CM | POA: Diagnosis not present

## 2022-01-11 NOTE — Telephone Encounter (Signed)
Requested medication (s) are due for refill today: Yes ? ?Requested medication (s) are on the active medication list: Yes ? ?Last refill:  08/25/21 ? ?Future visit scheduled: Yes ? ?Notes to clinic:  Unable to refill per protocol, cannot delegate. ? ? ? ? ? ?Requested Prescriptions  ?Pending Prescriptions Disp Refills  ? QUEtiapine (SEROQUEL) 200 MG tablet [Pharmacy Med Name: QUETIAPINE  200MG  TAB] 90 tablet 3  ?  Sig: TAKE 1 TABLET BY MOUTH DAILY AT  BEDTIME  ?  ? Not Delegated - Psychiatry:  Antipsychotics - Second Generation (Atypical) - quetiapine Failed - 01/10/2022  6:18 AM  ?  ?  Failed - This refill cannot be delegated  ?  ?  Failed - TSH in normal range and within 360 days  ?  TSH  ?Date Value Ref Range Status  ?12/06/2021 5.816 (H) 0.350 - 4.500 uIU/mL Final  ?  Comment:  ?  Performed by a 3rd Generation assay with a functional sensitivity of <=0.01 uIU/mL. ?Performed at [facility], [place] [address], [place], [place] [address] ?  ?11/24/2021 5.370 (H) 0.450 - 4.500 uIU/mL Final  ?  ?  ?  ?  Failed - Lipid Panel in normal range within the last 12 months  ?  Cholesterol, Total  ?Date Value Ref Range Status  ?11/24/2021 84 (L) 100 - 199 mg/dL Final  ? ?LDL Cholesterol (Calc)  ?Date Value Ref Range Status  ?01/01/2018 29 mg/dL (calc) Final  ?  Comment:  ?  Reference range: <100 ?Marland Kitchen ?Desirable range <100 mg/dL for primary prevention;   ?<70 mg/dL for patients with CHD or diabetic patients  ?with > or = 2 CHD risk factors. ?. ?LDL-C is now calculated using the Martin-Hopkins  ?calculation, which is a validated novel method providing  ?better accuracy than the Friedewald equation in the  ?estimation of LDL-C.  ?Cresenciano Genre et al. Annamaria Helling. 0093;818(29): 2061-2068  ?(http://education.[employer].com/faq/FAQ164) ?  ? ?LDL Chol Calc (NIH)  ?Date Value Ref Range Status  ?11/24/2021 18 0 - 99 mg/dL Final  ? ?HDL  ?Date Value Ref Range Status  ?11/24/2021 29 (L) >39 mg/dL Final  ? ?Triglycerides  ?Date Value Ref  Range Status  ?11/24/2021 243 (H) 0 - 149 mg/dL Final  ? ?  ?  ?  Passed - Completed PHQ-2 or PHQ-9 in the last 360 days  ?  ?  Passed - Last BP in normal range  ?  BP Readings from Last 1 Encounters:  ?01/11/22 114/73  ?  ?  ?  ?  Passed - Last Heart Rate in normal range  ?  Pulse Readings from Last 1 Encounters:  ?01/11/22 78  ?  ?  ?  ?  Passed - Valid encounter within last 6 months  ?  Recent Outpatient Visits   ? ?      ? 3 days ago Type 2 diabetes mellitus with peripheral neuropathy ([place])  ? [place], NP  ? 1 month ago NICM (nonischemic cardiomyopathy) ([place])  ? [place], NP  ? 3 months ago Yeast vaginitis  ? [place], NP  ? 4 months ago Yeast vaginitis  ? [place], NP  ? 4 months ago NICM (nonischemic cardiomyopathy) ([place])  ? [facility] [name], NP  ? ?  ?  ?Future Appointments   ? ?        ? In 6 days  [name], MD [facility]  ? In 1 month [name], NP [facility], [facility]  ? In 1 month [name], NP [facility], [facility]  ? ?  ? ?  ?  ?  Passed - CBC within normal limits and completed in the last 12 months  ?  WBC  ?Date Value Ref Range Status  ?11/24/2021 6.1 3.4 - 10.8 x10E3/uL Final  ?05/22/2021 8.5 4.0 - 10.5 K/uL Final  ? ?RBC  ?Date Value Ref Range Status  ?11/24/2021 5.40 (H) 3.77 - 5.28 x10E6/uL Final  ?05/22/2021 5.72 (H) 3.87 - 5.11 MIL/uL Final  ? ?Hemoglobin  ?Date Value Ref Range Status  ?11/24/2021 15.2 11.1 - 15.9 g/dL Final  ? ?Hematocrit  ?Date Value Ref Range Status  ?11/24/2021 48.6 (H) 34.0 - 46.6 % Final  ? ?MCHC  ?Date Value Ref Range Status  ?11/24/2021 31.3 (L) 31.5 - 35.7 g/dL Final  ?05/22/2021 30.7 30.0 - 36.0 g/dL Final  ? ?MCH  ?Date Value Ref Range Status  ?11/24/2021 28.1 26.6 - 33.0 pg Final  ?05/22/2021 26.4 26.0 - 34.0 pg Final  ? ?MCV  ?Date  Value Ref Range Status  ?11/24/2021 90 79 - 97 fL Final  ?02/24/2013 95 80 - 100 fL Final  ? ?No results found for: PLTCOUNTKUC, LABPLAT, [place] ?RDW  ?Date Value Ref Range Status  ?11/24/2021 14.4 11.7 - 15.4 % Final  ?02/24/2013 13.0 11.5 - 14.5 % Final  ? ?  ?  ?  Passed - CMP within normal limits and completed in the last 12 months  ?  Albumin  ?Date Value Ref Range Status  ?12/18/2021 4.0 3.5 - 5.0 g/dL Final  ?11/24/2021 5.1 (H) 3.8 - 4.8 g/dL Final  ?02/24/2013 3.6 3.4 - 5.0 g/dL Final  ? ?Alkaline Phosphatase  ?Date Value Ref Range Status  ?12/18/2021 144 (H) 38 - 126 U/L Final  ?02/24/2013 113 50 - 136 Unit/L Final  ? ?Alkaline phosphatase (APISO)  ?Date Value Ref Range Status  ?01/01/2018 92 33 - 130 U/L Final  ? ?ALT  ?Date Value Ref Range Status  ?12/18/2021 98 (H) 0 - 44 U/L Final  ? ?SGPT (ALT)  ?Date Value Ref Range Status  ?02/24/2013 94 (H) 12 - 78 U/L Final  ? ?AST  ?Date Value Ref Range Status  ?12/18/2021 96 (H) 15 - 41 U/L Final  ? ?SGOT(AST)  ?Date Value Ref Range Status  ?02/24/2013 58 (H) 15 - 37 Unit/L Final  ? ?BUN  ?Date Value Ref Range Status  ?12/18/2021 14 8 - 23 mg/dL Final  ?11/24/2021 16 8 - 27 mg/dL Final  ?02/24/2013 11 7 - 18 mg/dL Final  ? ?Calcium  ?Date Value Ref Range Status  ?12/18/2021 9.2 8.9 - 10.3 mg/dL Final  ? ?Calcium, Total  ?Date Value Ref Range Status  ?02/24/2013 8.6 8.5 - 10.1 mg/dL Final  ? ?CO2  ?Date Value Ref Range Status  ?12/18/2021 21 (L) 22 - 32 mmol/L Final  ? ?Co2  ?Date Value Ref Range Status  ?02/24/2013 26 21 - 32 mmol/L Final  ? ?Bicarbonate  ?Date Value Ref Range Status  ?01/06/2018 24.3 20.0 - 28.0 mmol/L Final  ? ?Creat  ?Date Value Ref Range Status  ?01/01/2018 0.82 0.50 - 0.99 mg/dL Final  ?  Comment:  ?  For patients >44 years of age, the reference limit ?for Creatinine is approximately 13% higher for people ?identified as African-American. ?. ?  ? ?Creatinine, Ser  ?Date  Value Ref Range Status  ?12/18/2021 0.64 0.44 - 1.00 mg/dL Final   ? ?Glucose  ?Date Value Ref Range Status  ?02/24/2013 87 65 - 99 mg/dL Final  ? ?Glucose, Bld  ?Date Value Ref Range Status  ?12/18/2021 252 (H) 70 - 99 mg/dL Final  ?  Comment:  ?  Glucose reference range applies only to samples taken after fasting for at least 8 hours.  ? ?Glucose-Capillary  ?Date Value Ref Range Status  ?01/04/2022 327 (H) 70 - 99 mg/dL Final  ?  Comment:  ?  Glucose reference range applies only to samples taken after fasting for at least 8 hours.  ? ?Potassium  ?Date Value Ref Range Status  ?12/18/2021 4.3 3.5 - 5.1 mmol/L Final  ?02/24/2013 4.1 3.5 - 5.1 mmol/L Final  ? ?Sodium  ?Date Value Ref Range Status  ?12/18/2021 134 (L) 135 - 145 mmol/L Final  ?11/24/2021 135 134 - 144 mmol/L Final  ?02/24/2013 143 136 - 145 mmol/L Final  ? ?Total Bilirubin  ?Date Value Ref Range Status  ?12/18/2021 0.4 0.3 - 1.2 mg/dL Final  ? ?Bilirubin,Total  ?Date Value Ref Range Status  ?02/24/2013 0.4 0.2 - 1.0 mg/dL Final  ? ?Bilirubin Total  ?Date Value Ref Range Status  ?11/24/2021 0.8 0.0 - 1.2 mg/dL Final  ? ?Protein, ur  ?Date Value Ref Range Status  ?01/04/2018 NEGATIVE NEGATIVE mg/dL Final  ? ?Protein,UA  ?Date Value Ref Range Status  ?09/20/2021 Negative Negative/Trace Final  ? ?Total Protein  ?Date Value Ref Range Status  ?12/18/2021 7.9 6.5 - 8.1 g/dL Final  ?11/24/2021 8.1 6.0 - 8.5 g/dL Final  ?02/24/2013 7.2 6.4 - 8.2 g/dL Final  ? ?GFR, Est African American  ?Date Value Ref Range Status  ?01/01/2018 88 > OR = 60 mL/min/1.10m Final  ? ?GFR calc Af Amer  ?Date Value Ref Range Status  ?10/17/2020 79 >59 mL/min/1.73 Final  ?  Comment:  ?  **In accordance with recommendations from the NKF-ASN Task force,** ?  [facility] is in the process of updating its eGFR calculation to the ?  2021 CKD-EPI creatinine equation that estimates kidney function ?  without a race variable. ?  ? ?eGFR  ?Date Value Ref Range Status  ?11/24/2021 65 >59 mL/min/1.73 Final  ? ?GFR, Est Non African American  ?Date Value Ref Range  Status  ?01/01/2018 76 > OR = 60 mL/min/1.743mFinal  ? ?GFR, Estimated  ?Date Value Ref Range Status  ?12/18/2021 >60 >60 mL/min Final  ?  Comment:  ?  (NOTE) ?Calculated using the CKD-EPI Creatinine Equation (2021)

## 2022-01-11 NOTE — Progress Notes (Signed)
? ? ?Primary Care Physician: [name], NP ? ?Primary Gastroenterologist:  Dr. Lucilla Lame ? ?Chief Complaint  ?Patient presents with  ? Follow-up  ?  Elevate LFTs  ? ? ?HPI: Kim Buchanan is a 71 y.o. female here with a history of seeing me in the past for GERD and most recently saw me for colonoscopy.  The patient is now being sent to me for abnormal liver enzymes.  The patient's most recent trends of her liver enzymes have shown: ? ?Component ?    Latest Ref Rng 07/19/2021 08/25/2021 11/24/2021 12/18/2021  ?AST ?    15 - 41 U/L 97 (H)  73 (H)  95 (H)  96 (H)   ?ALT ?    0 - 44 U/L 126 (H)  90 (H)  102 (H)  98 (H)   ?Alkaline Phosphatase ?    38 - 126 U/L 174 (H)  164 (H)  227 (H)  144 (H)   ?Total Bilirubin ?    0.3 - 1.2 mg/dL 0.6  0.7  0.8  0.4   ?  ?The patient had an ultrasound showing: ?  ?IMPRESSION: ?Changes of fatty liver.  No acute abnormality noted. ? ?The patient had a positive hepatitis C antibody a few years ago but a viral load was negative indicating a resolved infection versus a false antibody.  The patient denies any alcohol abuse or NSAID abuse.  She does report that she has gained weight over the last couple years.  There is no report of any abdominal pain.  The patient was concerned about her liver enzymes after being told by her cardiologist that she should see GI. ? ?Past Medical History:  ?Diagnosis Date  ? Anxiety   ? Arthritis   ? lower back  ? COPD (chronic obstructive pulmonary disease) ([place])   ? Depression   ? Diabetes mellitus without complication ([place])   ? GERD (gastroesophageal reflux disease)   ? Hyperlipidemia   ? Morbid obesity ([place])   ? NICM (nonischemic cardiomyopathy) ([place])   ? a. 03/2019 Echo: Nl LVEF; b. 09/04/2019 Echo: EF 25-30%; c. 09/11/2019 Cath: nonobs LAD/RCA dzs. EF 50-55%; c. 12/2020 Echo: EF 30-35%, glob HK. Mild LVH. Nl RVSP. Mild MR; d. 02/2021 RHC: Elev RH pressures. CO/CI 2.8/1.3; e. 06/2021 cMRI: EF 40%, marked sept-lat dyssynch & septal HK. Small, discrete  areas of mid-wall LGE in apical lateral and apical septal walls - ? h/o myocarditis.  ? Nonobstructive CAD (coronary artery disease)   ? a. 03/2019 [facility] [place]): EF 63%, ant ischemia; b. 08/2019 Cath: LM nl, LAD 62m RCA 70p (iFR nl @ 0.97), 50p. EF 50-55%-->Med Rx; c. 06/2020 Cor CTA: Nl FFRs throughout cor tree.  ? Osteoporosis   ? Panic attack   ? PTSD (post-traumatic stress disorder)   ? PVC's (premature ventricular contractions)   ? a. 05/2021 Zio: 20.6% PVC burden. 13.4 secs of NSVT-->amio added; b. 06/2021 Zio: 5.8% PVC burden. Rare supraventrciular ectopy.  ? Rectal fistula   ? Sleep apnea   ? a. Prev wore CPAP but lost insurance and hasn't been using since (now has insurance).  ? Wears dentures   ? full upper  ? ? ?Current Outpatient Medications  ?Medication Sig Dispense Refill  ? amiodarone (PACERONE) 200 MG tablet Take 1 tablet (200 mg total) by mouth daily. 90 tablet 3  ? aspirin 81 MG tablet Take 81 mg by mouth daily.     ? atorvastatin (LIPITOR) 10 MG tablet Take 1 tablet (  10 mg total) by mouth at bedtime. 90 tablet 3  ? blood glucose meter kit and supplies Dispense based on patient and insurance preference. Check fasting blood sugar once daily. (FOR ICD E11.69). 1 each 0  ? digoxin (LANOXIN) 0.125 MG tablet Take 1 tablet (0.125 mg total) by mouth daily. 30 tablet 11  ? Dulaglutide (TRULICITY) 1.5 CN/4.7SJ SOPN Inject 1.5 mg into the skin once a week. 6 mL 1  ? gabapentin (NEURONTIN) 300 MG capsule Take 1 capsule (300 mg total) by mouth 3 (three) times daily. 90 capsule 0  ? Lancets (ONETOUCH DELICA PLUS [id number]) MISC TEST ONCE DAILY 100 each 3  ? metFORMIN (GLUCOPHAGE) 1000 MG tablet TAKE 1 TABLET BY MOUTH  TWICE DAILY WITH MEALS 180 tablet 0  ? metoprolol succinate (TOPROL-XL) 50 MG 24 hr tablet Take 1 tablet (50 mg total) by mouth daily. 90 tablet 3  ? ONETOUCH ULTRA test strip TEST ONCE DAILY 100 strip 3  ? pantoprazole (PROTONIX) 40 MG tablet Take 1 tablet (40 mg total) by mouth daily. 90 tablet 4  ?  polyethylene glycol powder (GLYCOLAX/MIRALAX) 17 GM/SCOOP powder Take 17 g by mouth daily.    ? potassium chloride (KLOR-CON) 10 MEQ tablet Take 1 tablet (10 mEq total) by mouth as directed. Take with your Torsemide. 90 tablet 3  ? QUEtiapine (SEROQUEL) 200 MG tablet Take 1 tablet (200 mg total) by mouth daily. Patient takes at bed time. 90 tablet 1  ? spironolactone (ALDACTONE) 25 MG tablet Take 1 tablet (25 mg total) by mouth daily. 30 tablet 11  ? torsemide (DEMADEX) 20 MG tablet Take torsemide 40 mg (2 tabs) daily alternating with 20 mg (1 tab) daily 180 tablet 3  ? valsartan (DIOVAN) 40 MG tablet Take 1 tablet (40 mg total) by mouth 2 (two) times daily. 180 tablet 3  ? ?No current facility-administered medications for this visit.  ? ? ?Allergies as of 01/11/2022 - Review Complete 01/11/2022  ?Allergen Reaction Noted  ? Entresto [sacubitril-valsartan]  09/07/2021  ? Lisinopril Cough 06/05/2019  ? ? ?ROS: ? ?General: Negative for anorexia, weight loss, fever, chills, fatigue, weakness. ?ENT: Negative for hoarseness, difficulty swallowing , nasal congestion. ?CV: Negative for chest pain, angina, palpitations, dyspnea on exertion, peripheral edema.  ?Respiratory: Negative for dyspnea at rest, dyspnea on exertion, cough, sputum, wheezing.  ?GI: See history of present illness. ?GU:  Negative for dysuria, hematuria, urinary incontinence, urinary frequency, nocturnal urination.  ?Endo: Negative for unusual weight change.  ?  ?Physical Examination: ? ? BP 114/73   Pulse 78   Temp 98.3 ?F (36.8 ?C) (Oral)   Wt 220 lb (99.8 kg)   BMI 40.24 kg/m?  ? ?General: Well-nourished, well-developed in no acute distress.  ?Eyes: No icterus. Conjunctivae pink. ?Lungs: Clear to auscultation bilaterally. Non-labored. ?Heart: Regular rate and rhythm, no murmurs rubs or gallops.  ?Abdomen: Bowel sounds are normal, nontender, nondistended, no hepatosplenomegaly or masses, no abdominal bruits or hernia , no rebound or guarding.    ?Extremities: No lower extremity edema. No clubbing or deformities. ?Neuro: Alert and oriented x 3.  Grossly intact. ?Skin: Warm and dry, no jaundice.   ?Psych: Alert and cooperative, normal mood and affect. ? ?Labs:  ?  ?Imaging Studies: ?No results found. ? ?Assessment and Plan:  ? ?Kim Buchanan is a 72 y.o. y/o female comes in today with abnormal liver enzymes.  The patient hepatitis C antibody has been positive in the past but the viral load was negative.  The patient will have her blood work sent off for possible causes of the abnormal liver enzymes.  The patient also has been told that the ultrasound showed fatty liver and this may likely be the cause of her symptoms.  The patient should try to lose weight which can decrease the amount of fat in her liver.  The patient has been explained the plan and agrees with it. ? ? ? ? ?Lucilla Lame, MD. [name] ? ? ? Note: This dictation was prepared with Dragon dictation along with smaller phrase technology. Any transcriptional errors that result from this process are unintentional.  ?

## 2022-01-12 LAB — ANA: Anti Nuclear Antibody (ANA): NEGATIVE

## 2022-01-15 NOTE — Progress Notes (Signed)
PCP: [name], NP ?Cardiology: Dr. Rockey Situ ?HF Cardiology: Dr. Aundra Dubin ? ?71 y.o. with history of chronic systolic CHF/nonischemic cardiomyopathy, type 2 diabetes, and prior smoking was referred by Dr. Quentin Ore for evaluation of CHF.  She has been noted to have a cardiomyopathy since 2020.  Echo in 11/20 showed EF 25-30%.  LHC in 11/20 and coronary CTA in 9/21 have shown nonobstructive CAD.  She has been evaluated by pulmonary, mild restrictive lung disease and mild COPD.  She quit smoking in 2020, she does not drink ETOH.   Echo in 3/22 showed EF 30-35%, with normal RV.   [place] in 5/22 was worrisome, showing right > left heart failure and low cardiac output with low PAPI.  ? ?Zio patch in 8/22 showed 21% PVCs.  Amiodarone was started.  Cardiac MRI in 9/22 showed EF 40%, septal-lateral dyssynchrony, RV EF 45%, LGE in the mid-wall apical lateral and apical septal walls. Repeat Zio patch in 10/22 showed 5.8% PVCs, improved.   ? ?Patient stopped [place] after 3 yeast infections.  ? ?Today she returns for HF follow up with husband. Overall feeling ok. Has generalized fatigue and weakness, not very active. Has dyspnea walking further distances on flat ground. Some dizziness. Denies abnormal bleeding, CP, edema, or PND/Orthopnea. Appetite ok. No fever or chills. Weight at home 217 pounds. Taking all medications.  ? ?ECG (personally reviewed): NSR, LBBB 130 msec ? ?Labs (6/22): K 4.4, creatinine 0.79 ?Labs (2/23): K 4.8, creatinine 0.95, LDL 18, TSH 5.37, ALT 102, AST 95, hgb 15.2 ? ?PMH: ?1. Chronic systolic CHF: Nonischemic cardiomyopathy.   ?- Echo (6/20): Normal EF ?- [place] (6/20): Anterior ischemia.  ?- Echo (11/20): EF 25-30% ?- LHC (11/20): 70% pRCA stenosis, FFR negative.  ?- Echo (8/21): EF 30-35% ?- Coronary CTA (9/21): Mild RCA disease, 40-59% mLAD stenosis but CT FFR negative.  ?- Echo (3/22): EF 30-35% with normal RV ?- RHC (5/22): mean RA 17, mean PA 28, mean PCWP 20, CI 1.3, PAPI < 1.  ?- Cardiac  MRI (9/22): EF 40%, septal-lateral dyssynchrony, RV EF 45%, LGE in the mid-wall apical lateral and apical septal walls. ?2. COPD: Mild.  Quit smoking in 2020.  ?3. Restrictive lung disease: Mild restriction on PFTs.  ?4. Type 2 diabetes ?5. Coronary artery disease: Nonobstructive.  ?6. Hyperlipidemia ?7. OSA ?8. GERD ?9. Depression ?10. Elevated LFTs: Long-standing.  HCV negative.  ?11. PVCs: Zio patch in 8/22 showed 21% PVCs.  Amiodarone was started. Repeat Zio patch in 10/22 showed 5.8% PVCs, improved.   ?12. Sleep study (8/22): No OSA.  ? ?FH: Brother with "heart surgery," ?CHF. ? ?SH: Married, no ETOH, quit smoking in 2020, retired, lives in [place] ? ?ROS: All systems reviewed and negative except as per HPI.  ? ?Current Outpatient Medications  ?Medication Sig Dispense Refill  ? amiodarone (PACERONE) 200 MG tablet Take 1 tablet (200 mg total) by mouth daily. 90 tablet 3  ? aspirin 81 MG tablet Take 81 mg by mouth daily.     ? atorvastatin (LIPITOR) 10 MG tablet Take 1 tablet (10 mg total) by mouth at bedtime. 90 tablet 3  ? blood glucose meter kit and supplies Dispense based on patient and insurance preference. Check fasting blood sugar once daily. (FOR ICD E11.69). 1 each 0  ? digoxin (LANOXIN) 0.125 MG tablet Take 1 tablet (0.125 mg total) by mouth daily. 30 tablet 11  ? Dulaglutide (TRULICITY) 1.5 CV/8.9FY SOPN Inject 1.5 mg into the skin once a week. (Patient taking  differently: Inject 3 mg into the skin once a week.) 6 mL 1  ? gabapentin (NEURONTIN) 300 MG capsule Take 1 capsule (300 mg total) by mouth 3 (three) times daily. 90 capsule 0  ? Lancets (ONETOUCH DELICA PLUS [id number]) MISC TEST ONCE DAILY 100 each 3  ? metFORMIN (GLUCOPHAGE) 1000 MG tablet TAKE 1 TABLET BY MOUTH  TWICE DAILY WITH MEALS 180 tablet 0  ? metoprolol succinate (TOPROL-XL) 50 MG 24 hr tablet Take 1 tablet (50 mg total) by mouth daily. 90 tablet 3  ? ONETOUCH ULTRA test strip TEST ONCE DAILY 100 strip 3  ? pantoprazole (PROTONIX) 40 MG  tablet Take 1 tablet (40 mg total) by mouth daily. 90 tablet 4  ? polyethylene glycol powder (GLYCOLAX/MIRALAX) 17 GM/SCOOP powder Take 17 g by mouth daily.    ? potassium chloride (KLOR-CON) 10 MEQ tablet Take 1 tablet (10 mEq total) by mouth as directed. Take with your Torsemide. 90 tablet 3  ? QUEtiapine (SEROQUEL) 200 MG tablet Take 1 tablet (200 mg total) by mouth daily. Patient takes at bed time. 90 tablet 1  ? spironolactone (ALDACTONE) 25 MG tablet Take 1 tablet (25 mg total) by mouth daily. 30 tablet 11  ? torsemide (DEMADEX) 20 MG tablet Patient takes 1 tablet by mouth one day then 2 tablets by mouth every other day.    ? valsartan (DIOVAN) 40 MG tablet Take 1 tablet (40 mg total) by mouth 2 (two) times daily. 180 tablet 3  ? ?No current facility-administered medications for this encounter.  ? ?Wt Readings from Last 3 Encounters:  ?01/17/22 98.6 kg (217 lb 6.4 oz)  ?01/11/22 99.8 kg (220 lb)  ?01/08/22 100.1 kg (220 lb 9.6 oz)  ? ?BP 138/78   Pulse 83   Wt 98.6 kg (217 lb 6.4 oz)   SpO2 93%   BMI 39.76 kg/m?  ?General:  NAD. No resp difficulty ?HEENT: Normal ?Neck: Supple. No JVD. Carotids 2+ bilat; no bruits. No lymphadenopathy or thryomegaly appreciated. ?Cor: PMI nondisplaced. Regular rate & rhythm. No rubs, gallops or murmurs. ?Lungs: Clear ?Abdomen: Soft, nontender, nondistended. No hepatosplenomegaly. No bruits or masses. Good bowel sounds. ?Extremities: No cyanosis, clubbing, rash, edema ?Neuro: Alert & oriented x 3, cranial nerves grossly intact. Moves all 4 extremities w/o difficulty. Affect pleasant. ? ?Assessment/Plan: ?1. Chronic systolic CHF: Nonischemic cardiomyopathy.  Echo in 3/22 with EF 30-35%, normal RV.  LHC in 11/20 and coronary CTA in 9/21 showed nonobstructive CAD.  [place] in 5/22 was worrisome with R>L heart failure and low cardiac output as well as low PAPI.  Cardiac MRI in 9/22 showed EF 40%, septal-lateral dyssynchrony, RV EF 45%, LGE in the mid-wall apical lateral and apical  septal walls.  This was concerning for possible myocarditis (less likely amyloidosis or sarcoidosis).  Also concern that PVCs may play a role as Zio monitor in 8/22 showed 21% PVCs, improved to 5.8% with amiodarone.  She has NYHA class II symptoms.  She is not volume overloaded on exam.  ?- EF is now out of ICD range, QRS not wide enough for CRT.  ?- Continue torsemide 40 mg daily 20 mg every other day.  ?- Continue digoxin 0.125 daily, check level today.  ?- Continue valsartan 40 mg bid.  She could not tolerate Entresto due to cough.  ?- Continue spironolactone 25 mg daily.   ?- Continue dapagliflozin 10 mg daily.  ?- Continue Toprol XL 50 mg daily.  ?2. CAD: nonobstructive by LHC in 11/20 and coronary  CTA in 9/21.  ?- Continue atorvastatin.  ?- Continue ASA 81 daily.  ?3. PVCs: 21% PVCs on 8/22 Zio monitor.  ?Playing role in cardiomyopathy. PVCs down to 5.8% in 10/22 on amiodarone.  ?- Continue amiodarone 200 mg daily.  TSH mildly elevated recently, but free T3 and free T4 ok.  Also has chronic mild elevation in LFTs (see below).  ?4. Elevated LFTs: This appears to be chronic and mild.  LFT elevation pre-dates amiodarone use, and amiodarone does not seem to have worsened LFTs.   ?- PCP has ordered abdominal US.  ?- She had workup for viral hepatitis (negative) in the past.   ?- She has been re-evaluated by GI given use of amiodarone.   ? ?Follow up in 2 months with APP and 4 months with Dr. Aundra Dubin + echo. ? ?Rafael Bihari FNP-BC ?01/17/2022 ?

## 2022-01-16 ENCOUNTER — Telehealth (HOSPITAL_COMMUNITY): Payer: Self-pay

## 2022-01-16 NOTE — Telephone Encounter (Signed)
Called to confirm/remind patient of their appointment at the [facility] on 01/17/22.  ? ?Patient reminded to bring all medications and/or complete list. ? ?Confirmed patient has transportation. Gave directions, instructed to utilize [place] parking. ? ?Confirmed appointment prior to ending call.  ? ?

## 2022-01-17 ENCOUNTER — Encounter (HOSPITAL_COMMUNITY): Payer: Self-pay

## 2022-01-17 ENCOUNTER — Other Ambulatory Visit: Payer: Self-pay

## 2022-01-17 ENCOUNTER — Ambulatory Visit: Payer: Medicare Other | Admitting: Cardiology

## 2022-01-17 ENCOUNTER — Ambulatory Visit (HOSPITAL_COMMUNITY)
Admission: RE | Admit: 2022-01-17 | Discharge: 2022-01-17 | Disposition: A | Discharge status: Home / Self Care | Payer: Medicare Other | Source: Ambulatory Visit | Attending: Family Medicine | Admitting: Family Medicine

## 2022-01-17 VITALS — BP 138/78 | HR 83 | Wt 217.4 lb

## 2022-01-17 DIAGNOSIS — Z7982 Long term (current) use of aspirin: Secondary | ICD-10-CM | POA: Insufficient documentation

## 2022-01-17 DIAGNOSIS — Z87440 Personal history of urinary (tract) infections: Secondary | ICD-10-CM | POA: Diagnosis not present

## 2022-01-17 DIAGNOSIS — I2510 Atherosclerotic heart disease of native coronary artery without angina pectoris: Secondary | ICD-10-CM | POA: Insufficient documentation

## 2022-01-17 DIAGNOSIS — J449 Chronic obstructive pulmonary disease, unspecified: Secondary | ICD-10-CM | POA: Diagnosis not present

## 2022-01-17 DIAGNOSIS — I5023 Acute on chronic systolic (congestive) heart failure: Secondary | ICD-10-CM | POA: Insufficient documentation

## 2022-01-17 DIAGNOSIS — I5022 Chronic systolic (congestive) heart failure: Secondary | ICD-10-CM | POA: Diagnosis not present

## 2022-01-17 DIAGNOSIS — I428 Other cardiomyopathies: Secondary | ICD-10-CM | POA: Insufficient documentation

## 2022-01-17 DIAGNOSIS — Z87891 Personal history of nicotine dependence: Secondary | ICD-10-CM | POA: Insufficient documentation

## 2022-01-17 DIAGNOSIS — E119 Type 2 diabetes mellitus without complications: Secondary | ICD-10-CM | POA: Diagnosis not present

## 2022-01-17 DIAGNOSIS — Z79899 Other long term (current) drug therapy: Secondary | ICD-10-CM | POA: Diagnosis not present

## 2022-01-17 DIAGNOSIS — I5020 Unspecified systolic (congestive) heart failure: Secondary | ICD-10-CM | POA: Diagnosis not present

## 2022-01-17 DIAGNOSIS — Z8249 Family history of ischemic heart disease and other diseases of the circulatory system: Secondary | ICD-10-CM | POA: Insufficient documentation

## 2022-01-17 DIAGNOSIS — I447 Left bundle-branch block, unspecified: Secondary | ICD-10-CM | POA: Insufficient documentation

## 2022-01-17 DIAGNOSIS — I493 Ventricular premature depolarization: Secondary | ICD-10-CM

## 2022-01-17 DIAGNOSIS — R7989 Other specified abnormal findings of blood chemistry: Secondary | ICD-10-CM | POA: Insufficient documentation

## 2022-01-17 LAB — IRON,TIBC AND FERRITIN PANEL
Ferritin: 60 ng/mL (ref 15–150)
Iron Saturation: 24 % (ref 15–55)
Iron: 99 ug/dL (ref 27–139)
Total Iron Binding Capacity: 408 ug/dL (ref 250–450)
UIBC: 309 ug/dL (ref 118–369)

## 2022-01-17 LAB — HEPATITIS C ANTIBODY: Hep C Virus Ab: REACTIVE — AB

## 2022-01-17 LAB — ANTI-SMOOTH MUSCLE ANTIBODY, IGG: Smooth Muscle Ab: 3 Units (ref 0–19)

## 2022-01-17 LAB — HEPATITIS B SURFACE ANTIGEN: Hepatitis B Surface Ag: NEGATIVE

## 2022-01-17 LAB — ALPHA-1-ANTITRYPSIN: A-1 Antitrypsin: 168 mg/dL (ref 101–187)

## 2022-01-17 LAB — CERULOPLASMIN: Ceruloplasmin: 33.8 mg/dL (ref 19.0–39.0)

## 2022-01-17 LAB — MITOCHONDRIAL ANTIBODIES: Mitochondrial Ab: <20 Units (ref 0.0–20.0)

## 2022-01-17 LAB — HEPATITIS A ANTIBODY, TOTAL: hep A Total Ab: NEGATIVE

## 2022-01-17 LAB — HEPATITIS B SURFACE ANTIBODY,QUALITATIVE: Hep B Surface Ab, Qual: REACTIVE

## 2022-01-17 LAB — DIGOXIN LEVEL: Digoxin Level: 1.1 ng/mL (ref 0.8–2.0)

## 2022-01-17 NOTE — Patient Instructions (Addendum)
Thank you for coming in today ? ?Your physician recommends that you schedule a follow-up appointment in:  ?2 months in clinic  ?4 months with Dr. Haroldine Laws with echocardiogram ? ?Your physician has requested that you have an echocardiogram. Echocardiography is a painless test that uses sound waves to create images of your heart. It provides your doctor with information about the size and shape of your heart and how well your heart?s chambers and valves are working. This procedure takes approximately one hour. There are no restrictions for this procedure. ?  ?At the [facility], you and your health needs are our priority. As part of our continuing mission to provide you with exceptional heart care, we have created designated Provider Care Teams. These Care Teams include your primary Cardiologist (physician) and Advanced Practice Providers (APPs- Physician Assistants and Nurse Practitioners) who all work together to provide you with the care you need, when you need it.  ? ?You may see any of the following providers on your designated Care Team at your next follow up: ?Dr Glori Bickers ?Dr Loralie Champagne ?Darrick Grinder, NP ?Lyda Jester, PA ?Jessica Milford,NP ?Marlyce Huge, PA ?Audry Riles, PharmD ? ? ?Please be sure to bring in all your medications bottles to every appointment.  ? ?If you have any questions or concerns before your next appointment please send [place] a message through [place] or call our office at [phone].   ? ?TO LEAVE A MESSAGE FOR THE NURSE SELECT OPTION 2, PLEASE LEAVE A MESSAGE INCLUDING: ?YOUR NAME ?DATE OF BIRTH ?CALL BACK NUMBER ?REASON FOR CALL**this is important as we prioritize the call backs ? ?YOU WILL RECEIVE A CALL BACK THE SAME DAY AS LONG AS YOU CALL BEFORE 4:00 PM ? ?

## 2022-01-18 ENCOUNTER — Telehealth (HOSPITAL_COMMUNITY): Payer: Self-pay

## 2022-01-18 DIAGNOSIS — I5022 Chronic systolic (congestive) heart failure: Secondary | ICD-10-CM

## 2022-01-18 NOTE — Telephone Encounter (Addendum)
Pt aware, agreeable, and verbalized understanding ? ?She will get it done at [facility] ? ?----- Message from Rafael Bihari, FNP sent at 01/17/2022 10:23 AM EDT ----- ?Please repeat as a digoxin trough. She had just taken digoxin before labs today. ?

## 2022-01-25 ENCOUNTER — Telehealth: Payer: Self-pay | Admitting: Acute Care

## 2022-01-25 NOTE — Telephone Encounter (Signed)
Called to scheduled annual LDCT-LVMM. ?

## 2022-01-28 ENCOUNTER — Other Ambulatory Visit: Payer: Self-pay | Admitting: Nurse Practitioner

## 2022-01-29 NOTE — Telephone Encounter (Signed)
Requested Prescriptions  ?Pending Prescriptions Disp Refills  ?? pantoprazole (PROTONIX) 40 MG tablet [Pharmacy Med Name: Pantoprazole Sodium 40 MG Oral Tablet Delayed Release] 90 tablet 3  ?  Sig: TAKE 1 TABLET BY MOUTH  DAILY  ?  ? Gastroenterology: Proton Pump Inhibitors Passed - 01/28/2022 10:55 PM  ?  ?  Passed - Valid encounter within last 12 months  ?  Recent Outpatient Visits   ?      ? 3 weeks ago Type 2 diabetes mellitus with peripheral neuropathy ([place])  ? [place], NP  ? 2 months ago NICM (nonischemic cardiomyopathy) ([place])  ? [place], NP  ? 4 months ago Yeast vaginitis  ? [place], NP  ? 5 months ago Yeast vaginitis  ? [place], Lauren A, NP  ? 5 months ago NICM (nonischemic cardiomyopathy) ([place])  ? [facility] [name], NP  ?  ?  ?Future Appointments   ?        ? In 2 weeks [name], NP [facility], [facility]  ? In 3 weeks [name], NP [facility], [facility]  ?  ? ?  ?  ?  ? ?

## 2022-01-30 ENCOUNTER — Other Ambulatory Visit
Admission: RE | Admit: 2022-01-30 | Discharge: 2022-01-30 | Disposition: A | Discharge status: Home / Self Care | Payer: Medicare Other | Source: Home / Self Care | Attending: Family Medicine | Admitting: Family Medicine

## 2022-01-30 ENCOUNTER — Ambulatory Visit
Admission: RE | Admit: 2022-01-30 | Discharge: 2022-01-30 | Disposition: A | Discharge status: Home / Self Care | Payer: Medicare Other | Source: Ambulatory Visit | Attending: Nurse Practitioner | Admitting: Nurse Practitioner

## 2022-01-30 DIAGNOSIS — R7989 Other specified abnormal findings of blood chemistry: Secondary | ICD-10-CM | POA: Insufficient documentation

## 2022-01-30 DIAGNOSIS — I5022 Chronic systolic (congestive) heart failure: Secondary | ICD-10-CM | POA: Insufficient documentation

## 2022-01-30 DIAGNOSIS — K760 Fatty (change of) liver, not elsewhere classified: Secondary | ICD-10-CM | POA: Insufficient documentation

## 2022-01-30 DIAGNOSIS — Z1231 Encounter for screening mammogram for malignant neoplasm of breast: Secondary | ICD-10-CM

## 2022-01-30 LAB — HEPATIC FUNCTION PANEL
ALT: 116 U/L — ABNORMAL HIGH (ref 0–44)
AST: 108 U/L — ABNORMAL HIGH (ref 15–41)
Albumin: 4.2 g/dL (ref 3.5–5.0)
Alkaline Phosphatase: 155 U/L — ABNORMAL HIGH (ref 38–126)
Bilirubin, Direct: 0.4 mg/dL — ABNORMAL HIGH (ref 0.0–0.2)
Indirect Bilirubin: 0.6 mg/dL (ref 0.3–0.9)
Total Bilirubin: 1 mg/dL (ref 0.3–1.2)
Total Protein: 8.4 g/dL — ABNORMAL HIGH (ref 6.5–8.1)

## 2022-01-30 LAB — DIGOXIN LEVEL: Digoxin Level: <0.2 ng/mL — ABNORMAL LOW (ref 0.8–2.0)

## 2022-02-02 ENCOUNTER — Other Ambulatory Visit: Payer: Self-pay

## 2022-02-02 DIAGNOSIS — R748 Abnormal levels of other serum enzymes: Secondary | ICD-10-CM

## 2022-02-05 ENCOUNTER — Ambulatory Visit: Payer: Medicare Other

## 2022-02-05 ENCOUNTER — Ambulatory Visit (INDEPENDENT_AMBULATORY_CARE_PROVIDER_SITE_OTHER): Payer: Medicare Other | Admitting: Gastroenterology

## 2022-02-05 DIAGNOSIS — Z23 Encounter for immunization: Secondary | ICD-10-CM | POA: Diagnosis not present

## 2022-02-05 NOTE — Progress Notes (Signed)
1 of 3 Hep A given right deltoid, pt tolerated inj well ? ?next appt scheduled for 03/08/22 ?

## 2022-02-07 ENCOUNTER — Other Ambulatory Visit: Payer: Self-pay | Admitting: Nurse Practitioner

## 2022-02-08 DIAGNOSIS — H2513 Age-related nuclear cataract, bilateral: Secondary | ICD-10-CM | POA: Diagnosis not present

## 2022-02-08 DIAGNOSIS — H5203 Hypermetropia, bilateral: Secondary | ICD-10-CM | POA: Diagnosis not present

## 2022-02-08 DIAGNOSIS — E119 Type 2 diabetes mellitus without complications: Secondary | ICD-10-CM | POA: Diagnosis not present

## 2022-02-08 LAB — HM DIABETES EYE EXAM

## 2022-02-08 NOTE — Telephone Encounter (Signed)
Requested medication (s) are due for refill today: yes ? ?Requested medication (s) are on the active medication list: yes ? ?Last refill:  11/15/21 ? ?Future visit scheduled: yes ? ?Notes to clinic:  needs Hgb A1C ? ? ?Requested Prescriptions  ?Pending Prescriptions Disp Refills  ? metFORMIN (GLUCOPHAGE) 1000 MG tablet [Pharmacy Med Name: metFORMIN HCl 1000 MG Oral Tablet] 180 tablet 3  ?  Sig: TAKE 1 TABLET BY MOUTH TWICE  DAILY WITH MEALS  ?  ? Endocrinology:  Diabetes - Biguanides Failed - 02/07/2022 10:40 PM  ?  ?  Failed - HBA1C is between 0 and 7.9 and within 180 days  ?  HB A1C (BAYER DCA - WAIVED)  ?Date Value Ref Range Status  ?01/23/2021 8.6 (H) <7.0 % Final  ?  Comment:  ?                                        Diabetic Adult            <7.0 ?                                      Healthy Adult        4.3 - 5.7 ?                                                          (DCCT/NGSP) ?American Diabetes Association's Summary of Glycemic Recommendations ?for Adults with Diabetes: Hemoglobin A1c <7.0%. More stringent ?glycemic goals (A1c <6.0%) may further reduce complications at the ?cost of increased risk of hypoglycemia. ?  ? ?Hgb A1c MFr Bld  ?Date Value Ref Range Status  ?11/24/2021 11.7 (H) 4.8 - 5.6 % Final  ?  Comment:  ?           Prediabetes: 5.7 - 6.4 ?         Diabetes: >6.4 ?         Glycemic control for adults with diabetes: <7.0 ?  ?  ?  ?  ?  Failed - B12 Level in normal range and within 720 days  ?  No results found for: VITAMINB12  ?  ?  ?  Passed - Cr in normal range and within 360 days  ?  Creat  ?Date Value Ref Range Status  ?01/01/2018 0.82 0.50 - 0.99 mg/dL Final  ?  Comment:  ?  For patients >43 years of age, the reference limit ?for Creatinine is approximately 13% higher for people ?identified as African-American. ?. ?  ? ?Creatinine, Ser  ?Date Value Ref Range Status  ?12/18/2021 0.64 0.44 - 1.00 mg/dL Final  ?  ?  ?  ?  Passed - eGFR in normal range and within 360 days  ?  GFR, Est  African American  ?Date Value Ref Range Status  ?01/01/2018 88 > OR = 60 mL/min/1.34m Final  ? ?GFR calc Af Amer  ?Date Value Ref Range Status  ?10/17/2020 79 >59 mL/min/1.73 Final  ?  Comment:  ?  **In accordance with recommendations from the NKF-ASN Task force,** ?  [facility] is in the process of updating its eGFR  calculation to the ?  2021 CKD-EPI creatinine equation that estimates kidney function ?  without a race variable. ?  ? ?GFR, Est Non African American  ?Date Value Ref Range Status  ?01/01/2018 76 > OR = 60 mL/min/1.6m Final  ? ?GFR, Estimated  ?Date Value Ref Range Status  ?12/18/2021 >60 >60 mL/min Final  ?  Comment:  ?  (NOTE) ?Calculated using the CKD-EPI Creatinine Equation (2021) ?  ? ?eGFR  ?Date Value Ref Range Status  ?11/24/2021 65 >59 mL/min/1.73 Final  ?  ?  ?  ?  Passed - Valid encounter within last 6 months  ?  Recent Outpatient Visits   ? ?      ? 1 month ago Type 2 diabetes mellitus with peripheral neuropathy ([place]  ? [place] NP  ? 2 months ago NICM (nonischemic cardiomyopathy) ([place]  ? [place] NP  ? 4 months ago Yeast vaginitis  ? [place] NP  ? 5 months ago Yeast vaginitis  ? [place] Lauren A, NP  ? 5 months ago NICM (nonischemic cardiomyopathy) ([place]  ? [facility]  ? ?  ?  ?Future Appointments   ? ?        ? In 5 days [facility] ? In 1 week [facility]  ? In 4 weeks  [place] ? ?  ? ? ?  ?  ?  Passed - CBC within normal limits and completed in the last 12 months  ?  WBC  ?Date Value Ref Range Status  ?11/24/2021 6.1 3.4 - 10.8 x10E3/uL Final  ?05/22/2021 8.5 4.0 - 10.5 K/uL Final  ? ?RBC  ?Date Value Ref Range Status  ?11/24/2021 5.40 (H) 3.77 - 5.28 x10E6/uL Final  ?05/22/2021 5.72 (H) 3.87 - 5.11 MIL/uL Final   ? ?Hemoglobin  ?Date Value Ref Range Status  ?11/24/2021 15.2 11.1 - 15.9 g/dL Final  ? ?Hematocrit  ?Date Value Ref Range Status  ?11/24/2021 48.6 (H) 34.0 - 46.6 % Final  ? ?MCHC  ?Date Value Ref Range Status  ?11/24/2021 31.3 (L) 31.5 - 35.7 g/dL Final  ?05/22/2021 30.7 30.0 - 36.0 g/dL Final  ? ?MCH  ?Date Value Ref Range Status  ?11/24/2021 28.1 26.6 - 33.0 pg Final  ?05/22/2021 26.4 26.0 - 34.0 pg Final  ? ?MCV  ?Date Value Ref Range Status  ?11/24/2021 90 79 - 97 fL Final  ?02/24/2013 95 80 - 100 fL Final  ? ?No results found for: PLTCOUNTKUC, LABPLAT, PPike Road?RDW  ?Date Value Ref Range Status  ?11/24/2021 14.4 11.7 - 15.4 % Final  ?02/24/2013 13.0 11.5 - 14.5 % Final  ? ?  ?  ?  ? ? ? ? ?

## 2022-02-12 NOTE — Progress Notes (Signed)
? ?BP 136/78   Pulse 81   Temp 97.6 ?F (36.4 ?C) (Oral)   Wt 217 lb 9.6 oz (98.7 kg)   SpO2 94%   BMI 39.80 kg/m?   ? ?Subjective:  ? ? Patient ID: Kim Buchanan, female    DOB: [birth year], 71 y.o.   MRN: [id number] ? ?HPI: ?Kim Buchanan is a 71 y.o. female ? ?Chief Complaint  ?Patient presents with  ? Diabetes  ?  Requested eye exam from [facility]   ? ?DIABETES ?Hypoglycemic episodes:no ?Polydipsia/polyuria: no ?Visual disturbance: no ?Chest pain: no ?Paresthesias: no ?Glucose Monitoring: no ? Accucheck frequency: Daily ? Fasting glucose: 200-300 ? Post prandial: ? Evening: ? Before meals: ?Taking Insulin?: no ? Long acting insulin: ? Short acting insulin: ? ? ?Relevant past medical, surgical, family and social history reviewed and updated as indicated. Interim medical history since our last visit reviewed. ?Allergies and medications reviewed and updated. ? ?Review of Systems  ?Eyes:  Negative for visual disturbance.  ?Cardiovascular:  Negative for chest pain.  ?Endocrine: Negative for polydipsia and polyuria.  ?Neurological:  Negative for numbness.  ? ?Per HPI unless specifically indicated above ? ?   ?Objective:  ?  ?BP 136/78   Pulse 81   Temp 97.6 ?F (36.4 ?C) (Oral)   Wt 217 lb 9.6 oz (98.7 kg)   SpO2 94%   BMI 39.80 kg/m?   ?Wt Readings from Last 3 Encounters:  ?02/13/22 217 lb 9.6 oz (98.7 kg)  ?01/17/22 217 lb 6.4 oz (98.6 kg)  ?01/11/22 220 lb (99.8 kg)  ?  ?Physical Exam ?Vitals and nursing note reviewed.  ?Constitutional:   ?   General: She is not in acute distress. ?   Appearance: Normal appearance. She is obese. She is not ill-appearing, toxic-appearing or diaphoretic.  ?HENT:  ?   Head: Normocephalic.  ?   Right Ear: External ear normal.  ?   Left Ear: External ear normal.  ?   Nose: Nose normal.  ?   Mouth/Throat:  ?   Mouth: Mucous membranes are moist.  ?   Pharynx: Oropharynx is clear.  ?Eyes:  ?   General:     ?   Right eye: No discharge.     ?   Left eye: No discharge.  ?    Extraocular Movements: Extraocular movements intact.  ?   Conjunctiva/sclera: Conjunctivae normal.  ?   Pupils: Pupils are equal, round, and reactive to light.  ?Cardiovascular:  ?   Rate and Rhythm: Normal rate and regular rhythm.  ?   Heart sounds: Murmur heard.  ?Pulmonary:  ?   Effort: Pulmonary effort is normal. No respiratory distress.  ?   Breath sounds: Normal breath sounds. No wheezing or rales.  ?Musculoskeletal:  ?   Cervical back: Normal range of motion and neck supple.  ?Skin: ?   General: Skin is warm and dry.  ?   Capillary Refill: Capillary refill takes less than 2 seconds.  ?Neurological:  ?   General: No focal deficit present.  ?   Mental Status: She is alert and oriented to person, place, and time. Mental status is at baseline.  ?Psychiatric:     ?   Mood and Affect: Mood normal.     ?   Behavior: Behavior normal.     ?   Thought Content: Thought content normal.     ?   Judgment: Judgment normal.  ? ? ?Results for orders placed or performed during  the hospital encounter of 01/30/22  ?Digoxin level  ?Result Value Ref Range  ? Digoxin Level <0.2 (L) 0.8 - 2.0 ng/mL  ?Hepatic function panel  ?Result Value Ref Range  ? Total Protein 8.4 (H) 6.5 - 8.1 g/dL  ? Albumin 4.2 3.5 - 5.0 g/dL  ? AST 108 (H) 15 - 41 U/L  ? ALT 116 (H) 0 - 44 U/L  ? Alkaline Phosphatase 155 (H) 38 - 126 U/L  ? Total Bilirubin 1.0 0.3 - 1.2 mg/dL  ? Bilirubin, Direct 0.4 (H) 0.0 - 0.2 mg/dL  ? Indirect Bilirubin 0.6 0.3 - 0.9 mg/dL  ? ?   ?Assessment & Plan:  ? ?Problem List Items Addressed This Visit   ? ?  ? Endocrine  ? Type 2 diabetes mellitus with peripheral neuropathy (HCC) - Primary  ?  Chronic.  Not controlled.  Blood sugars are still running 200-300.  Will increase Trulicity to 2.8FV.  Will reach out to pharmacist due to patient being on patient assistance for Truilicity. Has yeast infection with [place].  Discussed possibly needing to restart insulin due to patient's blood sugars not being well controlled.  Keep  scheduled follow up for May 5 to repeat labs.  ? ?  ?  ? Relevant Orders  ? Comp Met (CMET)  ? HgB A1c  ?  ? ?Follow up plan: ?Return if symptoms worsen or fail to improve. ? ? ? ? ? ?

## 2022-02-13 ENCOUNTER — Encounter: Payer: Self-pay | Admitting: Nurse Practitioner

## 2022-02-13 ENCOUNTER — Ambulatory Visit (INDEPENDENT_AMBULATORY_CARE_PROVIDER_SITE_OTHER): Payer: Medicare Other | Admitting: Nurse Practitioner

## 2022-02-13 VITALS — BP 136/78 | HR 81 | Temp 97.6°F | Wt 217.6 lb

## 2022-02-13 DIAGNOSIS — E1142 Type 2 diabetes mellitus with diabetic polyneuropathy: Secondary | ICD-10-CM

## 2022-02-13 NOTE — Assessment & Plan Note (Signed)
Chronic.  Not controlled.  Blood sugars are still running 200-300.  Will increase Trulicity to 4.'5mg'$ .  Will reach out to pharmacist due to patient being on patient assistance for Tru[place]icity. Has yeast infection with [place].  Discussed possibly needing to restart insulin due to patient's blood sugars not being well controlled.  Keep scheduled follow up for May 5 to repeat labs.  ?

## 2022-02-14 ENCOUNTER — Telehealth: Payer: Self-pay

## 2022-02-14 NOTE — Chronic Care Management (AMB) (Signed)
? ? ?  Chronic Care Management ?Pharmacy Assistant  ? ?Name: Kim Buchanan  MRN: [id number] DOB: [birth year] ? ?Reason for Encounter: Patient assistance application Tr[place]icity Dose increase ?  ? ?Medications: ?Outpatient Encounter Medications as of 02/14/2022  ?Medication Sig  ? amiodarone (PACERONE) 200 MG tablet Take 1 tablet (200 mg total) by mouth daily.  ? aspirin 81 MG tablet Take 81 mg by mouth daily.   ? atorvastatin (LIPITOR) 10 MG tablet Take 1 tablet (10 mg total) by mouth at bedtime.  ? blood glucose meter kit and supplies Dispense based on patient and insurance preference. Check fasting blood sugar once daily. (FOR ICD E11.69).  ? digoxin (LANOXIN) 0.125 MG tablet Take 1 tablet (0.125 mg total) by mouth daily.  ? Dulaglutide (TRULICITY) 1.5 NL/8.9QJ SOPN Inject 1.5 mg into the skin once a week. (Patient taking differently: Inject 3 mg into the skin once a week.)  ? gabapentin (NEURONTIN) 300 MG capsule Take 1 capsule (300 mg total) by mouth 3 (three) times daily.  ? Lancets (ONETOUCH DELICA PLUS [id number]) MISC TEST ONCE DAILY  ? metFORMIN (GLUCOPHAGE) 1000 MG tablet TAKE 1 TABLET BY MOUTH TWICE  DAILY WITH MEALS  ? metoprolol succinate (TOPROL-XL) 50 MG 24 hr tablet Take 1 tablet (50 mg total) by mouth daily.  ? ONETOUCH ULTRA test strip TEST ONCE DAILY  ? pantoprazole (PROTONIX) 40 MG tablet TAKE 1 TABLET BY MOUTH  DAILY  ? polyethylene glycol powder (GLYCOLAX/MIRALAX) 17 GM/SCOOP powder Take 17 g by mouth daily.  ? potassium chloride (KLOR-CON) 10 MEQ tablet Take 1 tablet (10 mEq total) by mouth as directed. Take with your Torsemide.  ? QUEtiapine (SEROQUEL) 200 MG tablet Take 1 tablet (200 mg total) by mouth daily. Patient takes at bed time.  ? spironolactone (ALDACTONE) 25 MG tablet Take 1 tablet (25 mg total) by mouth daily.  ? torsemide (DEMADEX) 20 MG tablet Patient takes 1 tablet by mouth one day then 2 tablets by mouth every other day.  ? valsartan (DIOVAN) 40 MG tablet Take 1 tablet (40 mg  total) by mouth 2 (two) times daily.  ? ?No facility-administered encounter medications on file as of 02/14/2022.  ? ? ?I have prefilled and mailed out patient assistance application with the increase dosage 4.5 mg weekly. I have contacted the patient and explained to her what she needs to fill out and to return the form back to the office once she completed her section. Patient understood and I aware of the form being mailed out to her and the instructions as well. ? ?Corrie Mckusick, RMA ?Health Concierge ? ?

## 2022-02-16 ENCOUNTER — Other Ambulatory Visit
Admission: RE | Admit: 2022-02-16 | Discharge: 2022-02-16 | Disposition: A | Discharge status: Home / Self Care | Payer: Medicare Other | Attending: Gastroenterology | Admitting: Gastroenterology

## 2022-02-16 DIAGNOSIS — Z79899 Other long term (current) drug therapy: Secondary | ICD-10-CM | POA: Diagnosis not present

## 2022-02-16 DIAGNOSIS — I428 Other cardiomyopathies: Secondary | ICD-10-CM | POA: Diagnosis not present

## 2022-02-16 DIAGNOSIS — I5020 Unspecified systolic (congestive) heart failure: Secondary | ICD-10-CM | POA: Insufficient documentation

## 2022-02-16 DIAGNOSIS — I272 Pulmonary hypertension, unspecified: Secondary | ICD-10-CM | POA: Diagnosis not present

## 2022-02-16 DIAGNOSIS — I2510 Atherosclerotic heart disease of native coronary artery without angina pectoris: Secondary | ICD-10-CM | POA: Diagnosis not present

## 2022-02-16 LAB — BASIC METABOLIC PANEL
Anion gap: 11 (ref 5–15)
BUN: 11 mg/dL (ref 8–23)
CO2: 22 mmol/L (ref 22–32)
Calcium: 8.9 mg/dL (ref 8.9–10.3)
Chloride: 101 mmol/L (ref 98–111)
Creatinine, Ser: 0.71 mg/dL (ref 0.44–1.00)
GFR, Estimated: >60 mL/min (ref >60)
Glucose, Bld: 367 mg/dL — ABNORMAL HIGH (ref 70–99)
Potassium: 4.5 mmol/L (ref 3.5–5.1)
Sodium: 134 mmol/L — ABNORMAL LOW (ref 135–145)

## 2022-02-20 NOTE — Progress Notes (Signed)
? ?BP 131/69   Pulse 74   Temp 97.7 ?F (36.5 ?C) (Oral)   Wt 217 lb 6.4 oz (98.6 kg)   SpO2 94%   BMI 39.76 kg/m?   ? ?Subjective:  ? ? Patient ID: Kim Buchanan, female    DOB: 11-Aug-1951, 71 y.o.   MRN: [id number] ? ?HPI: ?Kim Buchanan is a 71 y.o. female ? ?Chief Complaint  ?Patient presents with  ? Hypertension  ? Hyperlipidemia  ? Diabetes  ? ?HYPERTENSION / HYPERLIPIDEMIA ?Patient states she stopped taking her her toresemde, potassium and amiodarone.  She was not told to stop these by Cardiology. She stopped then on her own. ?Satisfied with current treatment? yes ?Duration of hypertension: years ?BP monitoring frequency: a few times a month ?BP range: 120/80 ?BP medication side effects: no ?Past BP meds:  metoprolol and valsartan ?Duration of hyperlipidemia: years ?Cholesterol medication side effects: no ?Cholesterol supplements: none ?Past cholesterol medications: atorvastain (lipitor) ?Medication compliance: excellent compliance ?Aspirin: yes ?Recent stressors: no ?Recurrent headaches: no ?Visual changes: no ?Palpitations: no ?Dyspnea: SOB ?Chest pain: no ?Lower extremity edema: no ?Dizzy/lightheaded: no ? ?DIABETES ?Hypoglycemic episodes:no ?Polydipsia/polyuria: no ?Visual disturbance: no ?Chest pain: no ?Paresthesias: no ?Glucose Monitoring: yes ? Accucheck frequency: daily ? Fasting glucose: 200-300 ? Post prandial: ? Evening: ? Before meals: ?Taking Insulin?: yes ? Long acting insulin: ? Short acting insulin: ?Blood Pressure Monitoring: a few times a month ?Retinal Examination: Up to Date ?Foot Exam: Up to Date ?Diabetic Education: Not Completed ?Pneumovax: Up to Date ?Influenza: Up to Date ?Aspirin: yes ?Hasn't received the increased dose of Trulicity yet. ? ?HEART FAILURE ?Patient states she is seeing the cardiologist in [place]  this month to see the PA. Will see MD in July. States her SOB has improved some. Only becomes SOB when she is walking.  She is having a hard time sleeping and  would like to increase her seroquel to help her sleep.  ? ?COPD ?COPD status: controlled ?Satisfied with current treatment?: yes ?Oxygen use: no ?Dyspnea frequency:  ?Cough frequency:  ?Rescue inhaler frequency:   ?Limitation of activity: yes ?Productive cough:  ?Last Spirometry:  ?Pneumovax: Up to Date ?Influenza: Up to Date ? ? ?Relevant past medical, surgical, family and social history reviewed and updated as indicated. Interim medical history since our last visit reviewed. ?Allergies and medications reviewed and updated. ? ?Review of Systems  ?Eyes:  Negative for visual disturbance.  ?Respiratory:  Positive for shortness of breath. Negative for cough and chest tightness.   ?Cardiovascular:  Negative for chest pain, palpitations and leg swelling.  ?Endocrine: Negative for polydipsia and polyuria.  ?Neurological:  Negative for dizziness, numbness and headaches.  ?Psychiatric/Behavioral:  Positive for sleep disturbance.   ? ?Per HPI unless specifically indicated above ? ?   ?Objective:  ?  ?BP 131/69   Pulse 74   Temp 97.7 ?F (36.5 ?C) (Oral)   Wt 217 lb 6.4 oz (98.6 kg)   SpO2 94%   BMI 39.76 kg/m?   ?Wt Readings from Last 3 Encounters:  ?02/21/22 217 lb 6.4 oz (98.6 kg)  ?02/13/22 217 lb 9.6 oz (98.7 kg)  ?01/17/22 217 lb 6.4 oz (98.6 kg)  ?  ?Physical Exam ?Vitals and nursing note reviewed.  ?Constitutional:   ?   General: She is not in acute distress. ?   Appearance: Normal appearance. She is obese. She is not ill-appearing, toxic-appearing or diaphoretic.  ?HENT:  ?   Head: Normocephalic.  ?   Right Ear:  External ear normal.  ?   Left Ear: External ear normal.  ?   Nose: Nose normal.  ?   Mouth/Throat:  ?   Mouth: Mucous membranes are moist.  ?   Pharynx: Oropharynx is clear.  ?Eyes:  ?   General:     ?   Right eye: No discharge.     ?   Left eye: No discharge.  ?   Extraocular Movements: Extraocular movements intact.  ?   Conjunctiva/sclera: Conjunctivae normal.  ?   Pupils: Pupils are equal, round, and  reactive to light.  ?Cardiovascular:  ?   Rate and Rhythm: Normal rate and regular rhythm.  ?   Heart sounds: No murmur heard. ?Pulmonary:  ?   Effort: Pulmonary effort is normal. No respiratory distress.  ?   Breath sounds: Normal breath sounds. No wheezing or rales.  ?Musculoskeletal:  ?   Cervical back: Normal range of motion and neck supple.  ?   Right lower leg: No edema.  ?   Left lower leg: No edema.  ?Skin: ?   General: Skin is warm and dry.  ?   Capillary Refill: Capillary refill takes less than 2 seconds.  ?Neurological:  ?   General: No focal deficit present.  ?   Mental Status: She is alert and oriented to person, place, and time. Mental status is at baseline.  ?Psychiatric:     ?   Mood and Affect: Mood normal.     ?   Behavior: Behavior normal.     ?   Thought Content: Thought content normal.     ?   Judgment: Judgment normal.  ? ? ?Results for orders placed or performed during the hospital encounter of 02/16/22  ?Basic metabolic panel  ?Result Value Ref Range  ? Sodium 134 (L) 135 - 145 mmol/L  ? Potassium 4.5 3.5 - 5.1 mmol/L  ? Chloride 101 98 - 111 mmol/L  ? CO2 22 22 - 32 mmol/L  ? Glucose, Bld 367 (H) 70 - 99 mg/dL  ? BUN 11 8 - 23 mg/dL  ? Creatinine, Ser 0.71 0.44 - 1.00 mg/dL  ? Calcium 8.9 8.9 - 10.3 mg/dL  ? GFR, Estimated >60 >60 mL/min  ? Anion gap 11 5 - 15  ? ?   ?Assessment & Plan:  ? ?Problem List Items Addressed This Visit   ? ?  ? Cardiovascular and Mediastinum  ? Chronic systolic heart failure (HCC) - Primary (Chronic)  ?  Chronic. Ongoing.  Patient restarted medications after seeing HF clinic. Continues to follow up with Cardiology.  Continue per their recommendations.  Continue with ASA and Atorvastatin. ?  ?  ? Relevant Orders  ? Comp Met (CMET)  ? Hypertension associated with diabetes ([place])  ?  Chronic. Not well controlled.  Labs ordered today. A1c elevated at previous visits.  Increased Trulicity to 3.8GT.  She has not received this yet.  If not at goal in 3 months will need to  start Insulin.  Sugars are running 200-300s.  Follow up in 3 months for reevaluation. ? ?  ?  ? Relevant Orders  ? Comp Met (CMET)  ? NICM (nonischemic cardiomyopathy) ([place])  ?  Chronic. Ongoing.  Patient restarted medications after seeing HF clinic. Continues to follow up with Cardiology.  Continue per their recommendations.  Continue with ASA and Atorvastatin. ?  ?  ? Relevant Orders  ? Comp Met (CMET)  ? CAD (coronary artery disease), native coronary artery  ?  Chronic. Ongoing.  Patient restarted medications after seeing HF clinic. Continues to follow up with Cardiology.  Continue per their recommendations.  Continue with ASA and Atorvastatin. ?  ?  ? Aortic atherosclerosis ([place])  ?  Observed on CT in 2021.  Continue with ASA and Atorvastatin.   ? ?  ?  ? HFrEF (heart failure with reduced ejection fraction) ([place])  ?  Chronic. Ongoing.  Patient restarted medications after seeing HF clinic. Continues to follow up with Cardiology.  Continue per their recommendations.  Continue with ASA and Atorvastatin. ?  ?  ? Relevant Orders  ? Comp Met (CMET)  ?  ? Respiratory  ? COPD (chronic obstructive pulmonary disease) ([place])  ?  Chronic.  Controlled.  Continue with current medication regimen.  Labs ordered today.  Return to clinic in 3 months for reevaluation.  Call sooner if concerns arise.  ? ? ?  ?  ?  ? Endocrine  ? Hyperlipidemia associated with type 2 diabetes mellitus ([place])  ?  Chronic.  Controlled.  Continue with current medication regimen on Atorvastatin 18m.  Labs ordered today.  Return to clinic in 3 months for reevaluation.  Call sooner if concerns arise.  ? ? ?  ?  ? Relevant Orders  ? Lipid Profile  ? Type 2 diabetes mellitus with peripheral neuropathy (HCC)  ?  Chronic.  Not controlled.  Blood sugars are still running 200-300.  Will increase Trulicity to 40.0LK  Will reach out to pharmacist due to patient being on patient assistance for Truilicity.  Discussed possibly needing to restart insulin due to  patient's blood sugars not being well controlled.  Follow up in 3 months for reevaluation. ? ?  ?  ? Relevant Orders  ? HgB A1c  ?  ? Other  ? Morbid obesity ([place]  ?  Recommend a healthy lifestyle through diet and ex

## 2022-02-21 ENCOUNTER — Encounter: Payer: Self-pay | Admitting: Nurse Practitioner

## 2022-02-21 ENCOUNTER — Ambulatory Visit (INDEPENDENT_AMBULATORY_CARE_PROVIDER_SITE_OTHER): Payer: Medicare Other | Admitting: Nurse Practitioner

## 2022-02-21 VITALS — BP 131/69 | HR 74 | Temp 97.7°F | Wt 217.4 lb

## 2022-02-21 DIAGNOSIS — F331 Major depressive disorder, recurrent, moderate: Secondary | ICD-10-CM

## 2022-02-21 DIAGNOSIS — E1169 Type 2 diabetes mellitus with other specified complication: Secondary | ICD-10-CM | POA: Diagnosis not present

## 2022-02-21 DIAGNOSIS — J410 Simple chronic bronchitis: Secondary | ICD-10-CM | POA: Diagnosis not present

## 2022-02-21 DIAGNOSIS — I5020 Unspecified systolic (congestive) heart failure: Secondary | ICD-10-CM

## 2022-02-21 DIAGNOSIS — I700 Atherosclerosis of aorta: Secondary | ICD-10-CM | POA: Diagnosis not present

## 2022-02-21 DIAGNOSIS — I5022 Chronic systolic (congestive) heart failure: Secondary | ICD-10-CM

## 2022-02-21 DIAGNOSIS — E1142 Type 2 diabetes mellitus with diabetic polyneuropathy: Secondary | ICD-10-CM

## 2022-02-21 DIAGNOSIS — I152 Hypertension secondary to endocrine disorders: Secondary | ICD-10-CM | POA: Diagnosis not present

## 2022-02-21 DIAGNOSIS — E785 Hyperlipidemia, unspecified: Secondary | ICD-10-CM

## 2022-02-21 DIAGNOSIS — I25118 Atherosclerotic heart disease of native coronary artery with other forms of angina pectoris: Secondary | ICD-10-CM

## 2022-02-21 DIAGNOSIS — I428 Other cardiomyopathies: Secondary | ICD-10-CM | POA: Diagnosis not present

## 2022-02-21 DIAGNOSIS — E1159 Type 2 diabetes mellitus with other circulatory complications: Secondary | ICD-10-CM

## 2022-02-21 NOTE — Assessment & Plan Note (Signed)
Chronic.  Controlled.  Continue with current medication regimen.  Labs ordered today.  Return to clinic in 3 months for reevaluation.  Call sooner if concerns arise.   

## 2022-02-21 NOTE — Assessment & Plan Note (Signed)
Chronic.  Controlled.  Continue with current medication regimen on Atorvastatin 10mg.  Labs ordered today.  Return to clinic in 3 months for reevaluation.  Call sooner if concerns arise.   

## 2022-02-21 NOTE — Assessment & Plan Note (Signed)
Chronic. Ongoing.  Patient restarted medications after seeing HF clinic. Continues to follow up with Cardiology.  Continue per their recommendations.  Continue with ASA and Atorvastatin. 

## 2022-02-21 NOTE — Assessment & Plan Note (Signed)
Observed on CT in 2021.  Continue with ASA and Atorvastatin.   

## 2022-02-21 NOTE — Assessment & Plan Note (Signed)
Recommend a healthy lifestyle through diet and exercise. Recommend decreasing carbohydrate intake. ?

## 2022-02-21 NOTE — Assessment & Plan Note (Signed)
Chronic. Not well controlled.  Labs ordered today. A1c elevated at previous visits.  Increased Trulicity to 4.'5mg'$ .  She has not received this yet.  If not at goal in 3 months will need to start Insulin.  Sugars are running 200-300s.  Follow up in 3 months for reevaluation. ?

## 2022-02-21 NOTE — Assessment & Plan Note (Signed)
Chronic.  Not controlled.  Blood sugars are still running 200-300.  Will increase Trulicity to 4.'5mg'$ .  Will reach out to pharmacist due to patient being on patient assistance for Truilicity.  Discussed possibly needing to restart insulin due to patient's blood sugars not being well controlled.  Follow up in 3 months for reevaluation. ?

## 2022-02-22 LAB — COMPREHENSIVE METABOLIC PANEL
ALT: 106 IU/L — ABNORMAL HIGH (ref 0–32)
AST: 92 IU/L — ABNORMAL HIGH (ref 0–40)
Albumin/Globulin Ratio: 1.8 (ref 1.2–2.2)
Albumin: 4.7 g/dL (ref 3.8–4.8)
Alkaline Phosphatase: 175 IU/L — ABNORMAL HIGH (ref 44–121)
BUN/Creatinine Ratio: 21 (ref 12–28)
BUN: 15 mg/dL (ref 8–27)
Bilirubin Total: 0.6 mg/dL (ref 0.0–1.2)
CO2: 19 mmol/L — ABNORMAL LOW (ref 20–29)
Calcium: 9.1 mg/dL (ref 8.7–10.3)
Chloride: 102 mmol/L (ref 96–106)
Creatinine, Ser: 0.7 mg/dL (ref 0.57–1.00)
Globulin, Total: 2.6 g/dL (ref 1.5–4.5)
Glucose: 271 mg/dL — ABNORMAL HIGH (ref 70–99)
Potassium: 4.6 mmol/L (ref 3.5–5.2)
Sodium: 139 mmol/L (ref 134–144)
Total Protein: 7.3 g/dL (ref 6.0–8.5)
eGFR: 94 mL/min/{1.73_m2} (ref >59)

## 2022-02-22 LAB — LIPID PANEL
Chol/HDL Ratio: 2.4 ratio (ref 0.0–4.4)
Cholesterol, Total: 69 mg/dL — ABNORMAL LOW (ref 100–199)
HDL: 29 mg/dL — ABNORMAL LOW (ref >39)
LDL Chol Calc (NIH): 11 mg/dL (ref 0–99)
Triglycerides: 183 mg/dL — ABNORMAL HIGH (ref 0–149)
VLDL Cholesterol Cal: 29 mg/dL (ref 5–40)

## 2022-02-22 LAB — HEMOGLOBIN A1C
Est. average glucose Bld gHb Est-mCnc: 295 mg/dL
Hgb A1c MFr Bld: 11.9 % — ABNORMAL HIGH (ref 4.8–5.6)

## 2022-02-22 NOTE — Progress Notes (Signed)
Please let patient know that her lab work shows that her cholesterol is improved from prior.   ? ?Her A1c increased again to 11.9. I will work with Edison Nasuti to see what kind of insulin we can get her started on. Once I get that information I will let her know as well as how to use it and increase the dose.  Please let me know if she has any questions.

## 2022-02-23 ENCOUNTER — Telehealth: Payer: Self-pay

## 2022-02-23 NOTE — Telephone Encounter (Signed)
Patient is aware, schedule patient for OV 03/01/22. ?

## 2022-02-23 NOTE — Telephone Encounter (Signed)
Margo calling from [employer] is calling back regarding the basaglar quick pen there is no information on the [employer] program on the basaglar for this pt. Wanting to know if meant the trucility that is the only thing have approval for. ?Please advise CB- [phone] ?

## 2022-02-23 NOTE — Telephone Encounter (Signed)
-----   Message from Madelin Rear, [facility] sent at 02/23/2022  8:41 AM EDT ----- ?They actually let me call this in to their pharmacy. So Rx for Basaglar quick pen 100 units/ml inject 10 units daily and increase by 2 units every OTHER day until blood sugars less than 150. ? ?They are expediting since she does not have any - should arrive to patients house on Tuesday. Do you plan to talk w/ pt or would you like for me to call her? I saw your lab note that you might want to ?  ?----- Message ----- ?From: [name], NP ?Sent: 02/23/2022   7:58 AM EDT ?To: Madelin Rear, [facility] ? ?I want to start with 10 u and increase by 2 units every other day until sugars are less than 150. ? ?[name] ?----- Message ----- ?From: Madelin Rear, [facility] ?Sent: 02/22/2022   4:30 PM EDT ?To: [name], NP ? ?Did you want to start 10 units daily (it will take at least 2-3 wks to process)? I can just have you sign the pap application Rx. Let me know what units/instructions to include. Thanks!  ?----- Message ----- ?From: [name], NP ?Sent: 02/22/2022   9:22 AM EDT ?To: Madelin Rear, [facility] ? ?That would be great.  Do you need a prescription from me? ? ?----- Message ----- ?From: Madelin Rear, [facility] ?Sent: 02/22/2022   8:57 AM EDT ?To: [name], NP ? ?We have gotten her approved through [facility] on trulicity so I can have basaglar PAP application filled out through them if you'd like? ? ?----- Message ----- ?From: [name], NP ?Sent: 02/22/2022   8:10 AM EDT ?To: Madelin Rear, [place] ? ?HI Edison Nasuti, ? ?Ms. Sanna's A1c increased again to 11.9.  I think it is time to start insulin.  She said she will need assistance with it.  Is there one that you recommend [place] starting to hopefully get approved for her through patient assistance.  ? ?[name] ? ? ? ? ? ? ?

## 2022-02-23 NOTE — Telephone Encounter (Signed)
Spoke w/ [facility] pharmacy - gave verbal on Basaglar quick pen 100 units/ml inject 10 units daily and increase by 2 units every OTHER day until blood sugars less than 150.

## 2022-02-23 NOTE — Telephone Encounter (Signed)
Please let patient know that she has been approved for Basaglar insulin through the same company that does her Trulicity.  It has been expedited and should arrive at her house on Tuesday.  Can you please schedule her an appt for next Thursday or Friday to come in and see me so I can explain to her how I would like her to use it. ?

## 2022-02-28 ENCOUNTER — Telehealth: Payer: Self-pay

## 2022-02-28 NOTE — Telephone Encounter (Signed)
Patient made aware of paperwork from [facility] patient assistance program application has been faxed over.Patient voiced understanding.  ?

## 2022-03-01 ENCOUNTER — Ambulatory Visit: Payer: Medicare Other | Admitting: Nurse Practitioner

## 2022-03-01 NOTE — Progress Notes (Deleted)
   There were no vitals taken for this visit.   Subjective:    Patient ID: Kim Buchanan, female    DOB: 04/05/1951, 71 y.o.   MRN: [id number]  HPI: Kim Buchanan is a 71 y.o. female  No chief complaint on file.  DIABETES Hypoglycemic episodes:{Blank single:19197::"yes","no"} Polydipsia/polyuria: {Blank single:19197::"yes","no"} Visual disturbance: {Blank single:19197::"yes","no"} Chest pain: {Blank single:19197::"yes","no"} Paresthesias: {Blank single:19197::"yes","no"} Glucose Monitoring: {Blank single:19197::"yes","no"}  Accucheck frequency: {Blank single:19197::"Not Checking","Daily","BID","TID"}  Fasting glucose:  Post prandial:  Evening:  Before meals: Taking Insulin?: {Blank single:19197::"yes","no"}  Long acting insulin:  Short acting insulin: Blood Pressure Monitoring: {Blank single:19197::"not checking","rarely","daily","weekly","monthly","a few times a day","a few times a week","a few times a month"} Retinal Examination: {Blank single:19197::"Up to Date","Not up to Date"} Foot Exam: {Blank single:19197::"Up to Date","Not up to Date"} Diabetic Education: {Blank single:19197::"Completed","Not Completed"} Pneumovax: {Blank single:19197::"Up to Date","Not up to Date","unknown"} Influenza: {Blank single:19197::"Up to Date","Not up to Date","unknown"} Aspirin: {Blank single:19197::"yes","no"}  Relevant past medical, surgical, family and social history reviewed and updated as indicated. Interim medical history since our last visit reviewed. Allergies and medications reviewed and updated.  Review of Systems  Per HPI unless specifically indicated above     Objective:    There were no vitals taken for this visit.  Wt Readings from Last 3 Encounters:  02/21/22 217 lb 6.4 oz (98.6 kg)  02/13/22 217 lb 9.6 oz (98.7 kg)  01/17/22 217 lb 6.4 oz (98.6 kg)    Physical Exam  Results for orders placed or performed in visit on 02/21/22  Comp Met (CMET)  Result Value  Ref Range   Glucose 271 (H) 70 - 99 mg/dL   BUN 15 8 - 27 mg/dL   Creatinine, Ser 0.70 0.57 - 1.00 mg/dL   eGFR 94 >59 mL/min/1.73   BUN/Creatinine Ratio 21 12 - 28   Sodium 139 134 - 144 mmol/L   Potassium 4.6 3.5 - 5.2 mmol/L   Chloride 102 96 - 106 mmol/L   CO2 19 (L) 20 - 29 mmol/L   Calcium 9.1 8.7 - 10.3 mg/dL   Total Protein 7.3 6.0 - 8.5 g/dL   Albumin 4.7 3.8 - 4.8 g/dL   Globulin, Total 2.6 1.5 - 4.5 g/dL   Albumin/Globulin Ratio 1.8 1.2 - 2.2   Bilirubin Total 0.6 0.0 - 1.2 mg/dL   Alkaline Phosphatase 175 (H) 44 - 121 IU/L   AST 92 (H) 0 - 40 IU/L   ALT 106 (H) 0 - 32 IU/L  Lipid Profile  Result Value Ref Range   Cholesterol, Total 69 (L) 100 - 199 mg/dL   Triglycerides 183 (H) 0 - 149 mg/dL   HDL 29 (L) >39 mg/dL   VLDL Cholesterol Cal 29 5 - 40 mg/dL   LDL Chol Calc (NIH) 11 0 - 99 mg/dL   Chol/HDL Ratio 2.4 0.0 - 4.4 ratio  HgB A1c  Result Value Ref Range   Hgb A1c MFr Bld 11.9 (H) 4.8 - 5.6 %   Est. average glucose Bld gHb Est-mCnc 295 mg/dL      Assessment & Plan:   Problem List Items Addressed This Visit       Endocrine   Type 2 diabetes mellitus with peripheral neuropathy (HCC) - Primary     Follow up plan: No follow-ups on file.      

## 2022-03-08 ENCOUNTER — Ambulatory Visit (INDEPENDENT_AMBULATORY_CARE_PROVIDER_SITE_OTHER): Payer: Medicare Other | Admitting: Gastroenterology

## 2022-03-08 DIAGNOSIS — Z23 Encounter for immunization: Secondary | ICD-10-CM | POA: Diagnosis not present

## 2022-03-08 NOTE — Telephone Encounter (Signed)
I signed this last week and we went it in.

## 2022-03-08 NOTE — Telephone Encounter (Signed)
Printed and re-faxed the prescription for this.

## 2022-03-08 NOTE — Progress Notes (Signed)
Per orders of Dr. Lucilla Lame, injection of Hep A 2 of 3 given in Right deltoid   Patient tolerated injection well.

## 2022-03-08 NOTE — Telephone Encounter (Signed)
Called [employer], transferred to [employer]. The representative I spoke with spoke with the pharmacist and after being on hold for 30 minutes, came back and stated that [employer] is waiting for the prescription for this with the providers signature.   Can we please print the RX for this so we can send it in? Fax number to Ralph Leyden is [phone].

## 2022-03-08 NOTE — Telephone Encounter (Signed)
Called and spoke with patient. Discussed with her what was going on and refaxed the information. Asked for patient to give [place] or the company a call if she does not hear anything by the middle of next week.   Appointment for tomorrow cancelled since patient does not have her medication. Will reschedule appointment once the patient gets the medication.

## 2022-03-08 NOTE — Progress Notes (Deleted)
   There were no vitals taken for this visit.   Subjective:    Patient ID: Kim Buchanan, female    DOB: [birth year], 71 y.o.   MRN: [id number]  HPI: Kim Buchanan is a 71 y.o. female  No chief complaint on file.  DIABETES Hypoglycemic episodes:{Blank single:19197::"yes","no"} Polydipsia/polyuria: {Blank single:19197::"yes","no"} Visual disturbance: {Blank single:19197::"yes","no"} Chest pain: {Blank single:19197::"yes","no"} Paresthesias: {Blank single:19197::"yes","no"} Glucose Monitoring: {Blank single:19197::"yes","no"}  Accucheck frequency: {Blank single:19197::"Not Checking","Daily","BID","TID"}  Fasting glucose:  Post prandial:  Evening:  Before meals: Taking Insulin?: {Blank single:19197::"yes","no"}  Long acting insulin:  Short acting insulin: Blood Pressure Monitoring: {Blank single:19197::"not checking","rarely","daily","weekly","monthly","a few times a day","a few times a week","a few times a month"} Retinal Examination: {Blank single:19197::"Up to Date","Not up to Date"} Foot Exam: {Blank single:19197::"Up to Date","Not up to Date"} Diabetic Education: {Blank single:19197::"Completed","Not Completed"} Pneumovax: {Blank single:19197::"Up to Date","Not up to Date","unknown"} Influenza: {Blank single:19197::"Up to Date","Not up to Date","unknown"} Aspirin: {Blank single:19197::"yes","no"}  Relevant past medical, surgical, family and social history reviewed and updated as indicated. Interim medical history since our last visit reviewed. Allergies and medications reviewed and updated.  Review of Systems  Per HPI unless specifically indicated above     Objective:    There were no vitals taken for this visit.  Wt Readings from Last 3 Encounters:  02/21/22 217 lb 6.4 oz (98.6 kg)  02/13/22 217 lb 9.6 oz (98.7 kg)  01/17/22 217 lb 6.4 oz (98.6 kg)    Physical Exam  Results for orders placed or performed in visit on 02/21/22  Comp Met (CMET)  Result Value  Ref Range   Glucose 271 (H) 70 - 99 mg/dL   BUN 15 8 - 27 mg/dL   Creatinine, Ser 0.70 0.57 - 1.00 mg/dL   eGFR 94 >59 mL/min/1.73   BUN/Creatinine Ratio 21 12 - 28   Sodium 139 134 - 144 mmol/L   Potassium 4.6 3.5 - 5.2 mmol/L   Chloride 102 96 - 106 mmol/L   CO2 19 (L) 20 - 29 mmol/L   Calcium 9.1 8.7 - 10.3 mg/dL   Total Protein 7.3 6.0 - 8.5 g/dL   Albumin 4.7 3.8 - 4.8 g/dL   Globulin, Total 2.6 1.5 - 4.5 g/dL   Albumin/Globulin Ratio 1.8 1.2 - 2.2   Bilirubin Total 0.6 0.0 - 1.2 mg/dL   Alkaline Phosphatase 175 (H) 44 - 121 IU/L   AST 92 (H) 0 - 40 IU/L   ALT 106 (H) 0 - 32 IU/L  Lipid Profile  Result Value Ref Range   Cholesterol, Total 69 (L) 100 - 199 mg/dL   Triglycerides 183 (H) 0 - 149 mg/dL   HDL 29 (L) >39 mg/dL   VLDL Cholesterol Cal 29 5 - 40 mg/dL   LDL Chol Calc (NIH) 11 0 - 99 mg/dL   Chol/HDL Ratio 2.4 0.0 - 4.4 ratio  HgB A1c  Result Value Ref Range   Hgb A1c MFr Bld 11.9 (H) 4.8 - 5.6 %   Est. average glucose Bld gHb Est-mCnc 295 mg/dL      Assessment & Plan:   Problem List Items Addressed This Visit       Endocrine   Type 2 diabetes mellitus with peripheral neuropathy ([place]) - Primary     Follow up plan: No follow-ups on file.

## 2022-03-08 NOTE — Telephone Encounter (Signed)
Pt states her insulin has not arrived yet. She would like [place] to give her a call. Pt will decide whether to change this appt after she speaks w/ [place]

## 2022-03-09 ENCOUNTER — Ambulatory Visit: Payer: Medicare Other | Admitting: Nurse Practitioner

## 2022-03-12 ENCOUNTER — Telehealth: Payer: Medicare Other

## 2022-03-13 NOTE — Progress Notes (Signed)
I have called [employer] about the update on the patient assistance application for [employer]. They have received everything but page 5 of the [employer] application. Page 5 will need to be resent due to it having the prescription part and the PCP signature. It will need to be faxed at [phone].  Corrie Mckusick, [place]

## 2022-03-20 ENCOUNTER — Telehealth: Payer: Medicare Other

## 2022-03-20 ENCOUNTER — Ambulatory Visit (HOSPITAL_COMMUNITY)
Admission: RE | Admit: 2022-03-20 | Discharge: 2022-03-20 | Disposition: A | Discharge status: Home / Self Care | Payer: Medicare Other | Source: Ambulatory Visit | Attending: Family Medicine | Admitting: Family Medicine

## 2022-03-20 ENCOUNTER — Ambulatory Visit (HOSPITAL_BASED_OUTPATIENT_CLINIC_OR_DEPARTMENT_OTHER)
Admission: RE | Admit: 2022-03-20 | Discharge: 2022-03-20 | Disposition: A | Discharge status: Home / Self Care | Payer: Medicare Other | Source: Ambulatory Visit | Attending: Family Medicine | Admitting: Family Medicine

## 2022-03-20 ENCOUNTER — Encounter (HOSPITAL_COMMUNITY): Payer: Self-pay

## 2022-03-20 VITALS — BP 126/64 | HR 70 | Wt 218.6 lb

## 2022-03-20 DIAGNOSIS — E119 Type 2 diabetes mellitus without complications: Secondary | ICD-10-CM | POA: Insufficient documentation

## 2022-03-20 DIAGNOSIS — I509 Heart failure, unspecified: Secondary | ICD-10-CM | POA: Insufficient documentation

## 2022-03-20 DIAGNOSIS — R7989 Other specified abnormal findings of blood chemistry: Secondary | ICD-10-CM

## 2022-03-20 DIAGNOSIS — I5022 Chronic systolic (congestive) heart failure: Secondary | ICD-10-CM

## 2022-03-20 DIAGNOSIS — I493 Ventricular premature depolarization: Secondary | ICD-10-CM | POA: Diagnosis not present

## 2022-03-20 DIAGNOSIS — J449 Chronic obstructive pulmonary disease, unspecified: Secondary | ICD-10-CM | POA: Diagnosis not present

## 2022-03-20 DIAGNOSIS — E785 Hyperlipidemia, unspecified: Secondary | ICD-10-CM | POA: Insufficient documentation

## 2022-03-20 DIAGNOSIS — I110 Hypertensive heart disease with heart failure: Secondary | ICD-10-CM | POA: Diagnosis not present

## 2022-03-20 DIAGNOSIS — I2510 Atherosclerotic heart disease of native coronary artery without angina pectoris: Secondary | ICD-10-CM

## 2022-03-20 DIAGNOSIS — I428 Other cardiomyopathies: Secondary | ICD-10-CM | POA: Diagnosis not present

## 2022-03-20 NOTE — Progress Notes (Signed)
PCP: [name], NP Cardiology: Dr. Rockey Situ HF Cardiology: Dr. Aundra Dubin  71 y.o. with history of chronic systolic CHF/nonischemic cardiomyopathy, type 2 diabetes, and prior smoking was referred by Dr. Quentin Ore for evaluation of CHF.  She has been noted to have a cardiomyopathy since 2020.  Echo in 11/20 showed EF 25-30%.  LHC in 11/20 and coronary CTA in 9/21 have shown nonobstructive CAD.  She has been evaluated by pulmonary, mild restrictive lung disease and mild COPD.  She quit smoking in 2020, she does not drink ETOH.   Echo in 3/22 showed EF 30-35%, with normal RV.   [place] in 5/22 was worrisome, showing right > left heart failure and low cardiac output with low PAPI.   Zio patch in 8/22 showed 21% PVCs.  Amiodarone was started.  Cardiac MRI in 9/22 showed EF 40%, septal-lateral dyssynchrony, RV EF 45%, LGE in the mid-wall apical lateral and apical septal walls. Repeat Zio patch in 10/22 showed 5.8% PVCs, improved.    Patient stopped [place] after 3 yeast infections.   Today she returns for HF follow up with her husband. Overall feeling fine. Mild dyspnea when she pushes herself around the hosue. Denies palpitations, abnormal bleeding, CP, dizziness, edema, or PND/Orthopnea. Appetite ok. No fever or chills. Does not weigh regularly. Taking all medications.   Echo today (03/20/22), results pending.  ECG (personally reviewed): none ordered today.  Labs (6/22): K 4.4, creatinine 0.79 Labs (2/23): K 4.8, creatinine 0.95, LDL 18, TSH 5.37, ALT 102, AST 95, hgb 15.2 Labs (5/23): K 4.6, creatinine 0.70, LDL 11, HDL 29, TGs 183, ALT 106, AST 92, alkaline phos 175  PMH: 1. Chronic systolic CHF: Nonischemic cardiomyopathy.   - Echo (6/20): Normal EF - Cardiolite (6/20): Anterior ischemia.  - Echo (11/20): EF 25-30% - LHC (11/20): 70% pRCA stenosis, FFR negative.  - Echo (8/21): EF 30-35% - Coronary CTA (9/21): Mild RCA disease, 40-59% mLAD stenosis but CT FFR negative.  - Echo (3/22): EF 30-35%  with normal RV - RHC (5/22): mean RA 17, mean PA 28, mean PCWP 20, CI 1.3, PAPI < 1.  - Cardiac MRI (9/22): EF 40%, septal-lateral dyssynchrony, RV EF 45%, LGE in the mid-wall apical lateral and apical septal walls. 2. COPD: Mild.  Quit smoking in 2020.  3. Restrictive lung disease: Mild restriction on PFTs.  4. Type 2 diabetes 5. Coronary artery disease: Nonobstructive.  6. Hyperlipidemia 7. OSA 8. GERD 9. Depression 10. Elevated LFTs: Long-standing.  HCV negative.  11. PVCs: Zio patch in 8/22 showed 21% PVCs.  Amiodarone was started. Repeat Zio patch in 10/22 showed 5.8% PVCs, improved.   12. Sleep study (8/22): No OSA.   FH: Brother with "heart surgery," ?CHF.  SH: Married, no ETOH, quit smoking in 2020, retired, lives in [place]: All systems reviewed and negative except as per HPI.   Current Outpatient Medications  Medication Sig Dispense Refill   amiodarone (PACERONE) 200 MG tablet Take 1 tablet (200 mg total) by mouth daily. 90 tablet 3   aspirin 81 MG tablet Take 81 mg by mouth daily.      atorvastatin (LIPITOR) 10 MG tablet Take 1 tablet (10 mg total) by mouth at bedtime. 90 tablet 3   blood glucose meter kit and supplies Dispense based on patient and insurance preference. Check fasting blood sugar once daily. (FOR ICD E11.69). 1 each 0   digoxin (LANOXIN) 0.125 MG tablet Take 1 tablet (0.125 mg total) by mouth daily. 30 tablet 11  Dulaglutide (TRULICITY) 1.5 BJ/6.2GB SOPN Inject 1.5 mg into the skin once a week. (Patient taking differently: Inject 3 mg into the skin once a week.) 6 mL 1   gabapentin (NEURONTIN) 300 MG capsule Take 1 capsule (300 mg total) by mouth 3 (three) times daily. 90 capsule 0   Lancets (ONETOUCH DELICA PLUS [id number]) MISC TEST ONCE DAILY 100 each 3   metFORMIN (GLUCOPHAGE) 1000 MG tablet TAKE 1 TABLET BY MOUTH TWICE  DAILY WITH MEALS 180 tablet 3   metoprolol succinate (TOPROL-XL) 50 MG 24 hr tablet Take 1 tablet (50 mg total) by mouth daily. 90  tablet 3   ONETOUCH ULTRA test strip TEST ONCE DAILY 100 strip 3   pantoprazole (PROTONIX) 40 MG tablet TAKE 1 TABLET BY MOUTH  DAILY 90 tablet 3   polyethylene glycol powder (GLYCOLAX/MIRALAX) 17 GM/SCOOP powder Take 17 g by mouth daily.     potassium chloride (KLOR-CON) 10 MEQ tablet Take 1 tablet (10 mEq total) by mouth as directed. Take with your Torsemide. 90 tablet 3   QUEtiapine (SEROQUEL) 200 MG tablet Take 1 tablet (200 mg total) by mouth daily. Patient takes at bed time. 90 tablet 1   spironolactone (ALDACTONE) 25 MG tablet Take 1 tablet (25 mg total) by mouth daily. 30 tablet 11   torsemide (DEMADEX) 20 MG tablet Patient takes 1 tablet by mouth one day then 2 tablets by mouth every other day.     valsartan (DIOVAN) 40 MG tablet Take 1 tablet (40 mg total) by mouth 2 (two) times daily. 180 tablet 3   No current facility-administered medications for this encounter.   Wt Readings from Last 3 Encounters:  03/20/22 99.2 kg (218 lb 9.6 oz)  02/21/22 98.6 kg (217 lb 6.4 oz)  02/13/22 98.7 kg (217 lb 9.6 oz)   BP 126/64   Pulse 70   Wt 99.2 kg (218 lb 9.6 oz)   SpO2 93%   BMI 39.98 kg/m  Physical Exam: General:  NAD. No resp difficulty, arrived in [facility] HEENT: Normal Neck: Supple. No JVD. Carotids 2+ bilat; no bruits. No lymphadenopathy or thryomegaly appreciated. Cor: PMI nondisplaced. Regular rate & rhythm. No rubs, gallops or murmurs. Lungs: Clear Abdomen: Obese, nontender, nondistended. No hepatosplenomegaly. No bruits or masses. Good bowel sounds. Extremities: No cyanosis, clubbing, rash, edema Neuro: Alert & oriented x 3, cranial nerves grossly intact. Moves all 4 extremities w/o difficulty. Affect pleasant.  Assessment/Plan: 1. Chronic systolic CHF: Nonischemic cardiomyopathy.  Echo in 3/22 with EF 30-35%, normal RV.  LHC in 11/20 and coronary CTA in 9/21 showed nonobstructive CAD.  [place] in 5/22 was worrisome with R>L heart failure and low cardiac output as well as low PAPI.   Cardiac MRI in 9/22 showed EF 40%, septal-lateral dyssynchrony, RV EF 45%, LGE in the mid-wall apical lateral and apical septal walls.  This was concerning for possible myocarditis (less likely amyloidosis or sarcoidosis).  Also concern that PVCs may play a role as Zio monitor in 8/22 showed 21% PVCs, improved to 5.8% with amiodarone.  She has NYHA class II symptoms.  She is not volume overloaded on exam. Echo today, results pending. - Continue torsemide 40 mg daily, with 20 mg every other day. Recent labs ok, SCr 0.7, K 4.6 - Continue digoxin 0.125 daily. Will have her return to PCP this week to get digoxin level as a trough.  - Continue valsartan 40 mg bid.  She could not tolerate Entresto due to cough.  - Continue spironolactone 25  mg daily.   - Continue Toprol XL 50 mg daily.  - Off Farxiga due to yeast infection. 2. CAD: nonobstructive by LHC in 11/20 and coronary CTA in 9/21. No chest pain. - Continue atorvastatin. Good lipids 5/23. - Continue ASA 81 daily.  3. PVCs: 21% PVCs on 8/22 Zio monitor.  ?Playing role in cardiomyopathy. PVCs down to 5.8% in 10/22 on amiodarone.  - Continue amiodarone 200 mg daily.  TSH mildly elevated recently, but free T3 and free T4 ok.  Also has chronic mild elevation in LFTs (see below).  4. Elevated LFTs: This appears to be chronic and mild.  LFT elevation pre-dates amiodarone use, and amiodarone does not seem to have worsened LFTs.  Abdominal ultrasound 2/23 shows changes consistent with fatty liver. - She had workup for viral hepatitis (negative) in the past.   - She has been re-evaluated by GI given use of amiodarone.    Follow up in 2-3 months with Dr. Aundra Dubin as scheduled.  Maricela Bo [facility] FNP-BC 03/20/2022

## 2022-03-20 NOTE — Patient Instructions (Addendum)
It was great to see you today! No medication changes are needed at this time.   Labs needed in 7-10 days ( be sure to hold digoxin the morning of this lab appointment)  Your physician recommends that you schedule a follow-up appointment in: 2-3 months with Dr Aundra Dubin  Do the following things EVERYDAY: Weigh yourself in the morning before breakfast. Write it down and keep it in a log. Take your medicines as prescribed Eat low salt foods--Limit salt (sodium) to 2000 mg per day.  Stay as active as you can everyday Limit all fluids for the day to less than 2 liters  At the [facility], you and your health needs are our priority. As part of our continuing mission to provide you with exceptional heart care, we have created designated Provider Care Teams. These Care Teams include your primary Cardiologist (physician) and Advanced Practice Providers (APPs- Physician Assistants and Nurse Practitioners) who all work together to provide you with the care you need, when you need it.   You may see any of the following providers on your designated Care Team at your next follow up: Dr Glori Bickers Dr Haynes Kerns, NP Lyda Jester, [place] [facility] [place], [place] Audry Riles, PharmD   Please be sure to bring in all your medications bottles to every appointment.

## 2022-03-21 ENCOUNTER — Telehealth (HOSPITAL_COMMUNITY): Payer: Self-pay

## 2022-03-21 ENCOUNTER — Telehealth: Payer: Self-pay | Admitting: Nurse Practitioner

## 2022-03-21 LAB — ECHOCARDIOGRAM COMPLETE
Area-P 1/2: 3.72 cm2
Calc EF: 52 %
S' Lateral: 3.4 cm
Single Plane A2C EF: 49.8 %
Single Plane A4C EF: 48.6 %

## 2022-03-21 NOTE — Telephone Encounter (Signed)
See other telephone encounter  Copied from [place] [phone]. Topic: General - Other >> Mar 21, 2022  9:46 AM Yvette Rack wrote: Reason for CRM: Pt spouse called for an update on the application for [facility]. Pt spouse stated he was told by Tim Lair that the holdup id due to the page with insulin information needs to be completed and faxed back.

## 2022-03-21 NOTE — Telephone Encounter (Addendum)
Pt aware, agreeable, and verbalized understanding   ----- Message from Rafael Bihari, FNP sent at 03/21/2022  1:30 PM EDT ----- Stable, mild improvement in EF from prior, now 35-40%.

## 2022-03-21 NOTE — Telephone Encounter (Signed)
Pt husband called and stated that Insulin paper needs to be filled out and faxed again. He is stating that the page needs to be filled out again. He did not say why.  Pt husband would like a call back when this is done.   Patient states that he does not have the fax for [facility].

## 2022-03-22 ENCOUNTER — Other Ambulatory Visit
Admission: RE | Admit: 2022-03-22 | Discharge: 2022-03-22 | Disposition: A | Discharge status: Home / Self Care | Payer: Medicare Other | Attending: Family Medicine | Admitting: Family Medicine

## 2022-03-22 DIAGNOSIS — I5022 Chronic systolic (congestive) heart failure: Secondary | ICD-10-CM | POA: Insufficient documentation

## 2022-03-22 DIAGNOSIS — I509 Heart failure, unspecified: Secondary | ICD-10-CM | POA: Diagnosis present

## 2022-03-22 LAB — DIGOXIN LEVEL: Digoxin Level: 0.6 ng/mL — ABNORMAL LOW (ref 0.8–2.0)

## 2022-03-22 NOTE — Telephone Encounter (Signed)
Spoke with Jarrett Soho from [place], we have filled out the diabetes prescription fax form for basalgard and trulicity. I have faxed it over to [phone].

## 2022-03-22 NOTE — Telephone Encounter (Signed)
Please call the number that I provided for [employer].  I have already filled out information for both Basaglar and trulicity.

## 2022-03-26 ENCOUNTER — Ambulatory Visit (INDEPENDENT_AMBULATORY_CARE_PROVIDER_SITE_OTHER): Payer: Medicare Other

## 2022-03-26 DIAGNOSIS — I5020 Unspecified systolic (congestive) heart failure: Secondary | ICD-10-CM

## 2022-03-26 DIAGNOSIS — E1142 Type 2 diabetes mellitus with diabetic polyneuropathy: Secondary | ICD-10-CM

## 2022-03-26 NOTE — Progress Notes (Unsigned)
Chronic Care Management Pharmacy Note  03/27/2022 Name:  Kim Buchanan MRN:  [id number] DOB:  17-Feb-1951  Summary: FBG low 200s at home Spoke again with [facility] for PAP updates  Confirmed that they rec'd trulicity 4.5 mg Stated there was a discrepancy on Basaglar with daily dose of 1 per day so will need new Rx for processing Collaborated w/ PCP on getting updated Basaglar Rx, refaxed 6/5 w/ 80 units/day max insulin dose. Placed in bin for EMR upload.   Subjective: Kim Buchanan is an 71 y.o. year old female who is a primary patient of [name], NP.  The CCM team was consulted for assistance with disease management and care coordination needs.    Engaged with patient by telephone for follow up visit in response to provider referral for pharmacy case management and/or care coordination services.   Consent to Services:  The patient was given information about Chronic Care Management services, agreed to services, and gave verbal consent prior to initiation of services.  Please see initial visit note for detailed documentation.   Patient Care Team: [name], NP as PCP - General (Nurse Practitioner) [name], MD as PCP - Cardiology (Cardiology) [name], MD as PCP - Electrophysiology (Cardiology) [name], OD as Consulting Physician (Optometry) [name], MD as Consulting Physician (Pulmonary Disease)  Care Gaps -Needs foot exam  STAR Meds -no gaps noted   Hospital visits: None in previous 6 months  Objective:  Lab Results  Component Value Date   CREATININE 0.70 02/21/2022   CREATININE 0.71 02/16/2022   CREATININE 0.64 12/18/2021    Lab Results  Component Value Date   HGBA1C 11.9 (H) 02/21/2022   HGBA1C 11.7 (H) 11/24/2021   HGBA1C 9.9 (H) 08/25/2021   Last diabetic Eye exam:  Lab Results  Component Value Date/Time   HMDIABEYEEXA No Retinopathy 02/08/2022 12:00 AM    Last diabetic Foot exam: No results  found for: HMDIABFOOTEX      Component Value Date/Time   CHOL 69 (L) 02/21/2022 0820   CHOL 84 (L) 11/24/2021 0920   CHOL 74 (L) 08/25/2021 1027   TRIG 183 (H) 02/21/2022 0820   TRIG 243 (H) 11/24/2021 0920   TRIG 198 (H) 08/25/2021 1027   HDL 29 (L) 02/21/2022 0820   HDL 29 (L) 11/24/2021 0920   HDL 29 (L) 08/25/2021 1027   CHOLHDL 2.4 02/21/2022 0820   CHOLHDL 1.7 01/01/2018 0000   VLDL 29 08/06/2016 0824   LDLCALC 11 02/21/2022 0820   LDLCALC 18 11/24/2021 0920   LDLCALC 14 08/25/2021 1027   LDLCALC 29 01/01/2018 0000       Latest Ref Rng & Units 02/21/2022    8:20 AM 01/30/2022   11:10 AM 12/18/2021   10:09 AM  Hepatic Function  Total Protein 6.0 - 8.5 g/dL 7.3   8.4   7.9    Albumin 3.8 - 4.8 g/dL 4.7   4.2   4.0    AST 0 - 40 IU/L 92   108   96    ALT 0 - 32 IU/L 106   116   98    Alk Phosphatase 44 - 121 IU/L 175   155   144    Total Bilirubin 0.0 - 1.2 mg/dL 0.6   1.0   0.4    Bilirubin, Direct 0.0 - 0.2 mg/dL  0.4       Lab Results  Component Value Date/Time   TSH 5.816 (  H) 12/06/2021 10:41 AM   TSH 5.370 (H) 11/24/2021 09:20 AM   TSH 4.100 07/19/2021 01:52 PM   FREET4 0.81 12/06/2021 10:42 AM   FREET4 1.31 07/19/2021 01:52 PM       Latest Ref Rng & Units 11/24/2021    9:20 AM 06/13/2021    8:36 AM 05/22/2021    3:18 PM  CBC  WBC 3.4 - 10.8 x10E3/uL 6.1   6.9   8.5    Hemoglobin 11.1 - 15.9 g/dL 15.2   14.3   15.1    Hematocrit 34.0 - 46.6 % 48.6   44.8   49.2    Platelets 150 - 450 x10E3/uL 299   225   353      No results found for: VD25OH  Clinical ASCVD:  The ASCVD Risk score (Arnett DK, et al., 2019) failed to calculate for the following reasons:   The valid total cholesterol range is 130 to 320 mg/dL   Social History   Tobacco Use  Smoking Status Former   Packs/day: 1.50   Years: 50.00   Pack years: 75.00   Types: Cigarettes   Quit date: 11/29/2018   Years since quitting: 3.3  Smokeless Tobacco Never  Tobacco Comments   started at age 37    BP Readings from Last 3 Encounters:  03/20/22 126/64  02/21/22 131/69  02/13/22 136/78   Pulse Readings from Last 3 Encounters:  03/20/22 70  02/21/22 74  02/13/22 81   Wt Readings from Last 3 Encounters:  03/20/22 218 lb 9.6 oz (99.2 kg)  02/21/22 217 lb 6.4 oz (98.6 kg)  02/13/22 217 lb 9.6 oz (98.7 kg)   BMI Readings from Last 3 Encounters:  03/20/22 39.98 kg/m  02/21/22 39.76 kg/m  02/13/22 39.80 kg/m    Assessment: Review of patient past medical history, allergies, medications, health status, including review of consultants reports, laboratory and other test data, was performed as part of comprehensive evaluation and provision of chronic care management services.   SDOH:  (Social Determinants of Health) assessments and interventions performed: Yes SDOH Interventions    Flowsheet Row Most Recent Value  SDOH Interventions   Food Insecurity Interventions Intervention Not Indicated  Transportation Interventions Intervention Not Indicated       SDOH Interventions    Flowsheet Row Most Recent Value  SDOH Interventions   Food Insecurity Interventions Intervention Not Indicated  Transportation Interventions Intervention Not Indicated       CCM Care Plan  Allergies  Allergen Reactions   Entresto [Sacubitril-Valsartan]     Cough    Lisinopril Cough    Medications Reviewed Today     Reviewed by Madelin Rear, [facility] (Pharmacist) on 03/26/22 at [place] List Status: <None>   Medication Order Taking? Sig Documenting Provider Last Dose Status Informant  amiodarone (PACERONE) 200 MG tablet [id number] No Take 1 tablet (200 mg total) by mouth daily. Larey Dresser, MD Taking Active   aspirin 81 MG tablet [id number] No Take 81 mg by mouth daily.  [provider] Taking Active Self           Med Note Josiah Lobo, [facility]   Fri Jul 19, 2017 12:33 PM)    atorvastatin (LIPITOR) 10 MG tablet [id number] No Take 1 tablet (10 mg total) by mouth at bedtime. Larey Dresser, MD Taking Active   blood glucose meter kit and supplies [id number] No Dispense based on patient and insurance preference. Check fasting blood sugar once daily. (FOR ICD E11.69). Bacigalupo,  Dionne Bucy, MD Taking Active Self  digoxin (LANOXIN) 0.125 MG tablet [id number] No Take 1 tablet (0.125 mg total) by mouth daily. Larey Dresser, MD Taking Active   Dulaglutide (TRULICITY) 1.5 GY/1.7CB Bonney Aid [id number] No Inject 1.5 mg into the skin once a week.  Patient taking differently: Inject 3 mg into the skin once a week.   [name], NP Taking Active   gabapentin (NEURONTIN) 300 MG capsule [id number] No Take 1 capsule (300 mg total) by mouth 3 (three) times daily. [name], NP Taking Active Self  Lancets (ONETOUCH DELICA PLUS [id number]) [place] [id number] No TEST ONCE DAILY [name], NP Taking Active   metFORMIN (GLUCOPHAGE) 1000 MG tablet [id number] No TAKE 1 TABLET BY MOUTH TWICE  DAILY WITH MEALS [name], NP Taking Active   metoprolol succinate (TOPROL-XL) 50 MG 24 hr tablet [id number] No Take 1 tablet (50 mg total) by mouth daily. Larey Dresser, MD Taking Active   [facility] ULTRA test strip [id number] No TEST ONCE DAILY [name], NP Taking Active   pantoprazole (PROTONIX) 40 MG tablet [id number] No TAKE 1 TABLET BY MOUTH  DAILY [name], NP Taking Active   polyethylene glycol powder (GLYCOLAX/MIRALAX) 17 GM/SCOOP powder [id number] No Take 17 g by mouth daily. [provider] Taking Active Self  potassium chloride (KLOR-CON) 10 MEQ tablet [id number] No Take 1 tablet (10 mEq total) by mouth as directed. Take with your Torsemide. Rise Mu, PA-C Taking Active Self  QUEtiapine (SEROQUEL) 200 MG tablet [id number] No Take 1 tablet (200 mg total) by mouth daily. Patient takes at bed time. [name], NP Taking Active   spironolactone (ALDACTONE) 25 MG tablet [id number] No Take 1 tablet (25 mg total) by mouth daily. Larey Dresser, MD Taking  Active   torsemide [facility]) 20 MG tablet [id number] No Patient takes 1 tablet by mouth one day then 2 tablets by mouth every other day. [provider] Taking Active   valsartan (DIOVAN) 40 MG tablet [id number] No Take 1 tablet (40 mg total) by mouth 2 (two) times daily. Larey Dresser, MD Taking Active             Patient Active Problem List   Diagnosis Date Noted   Chronic systolic heart failure ([place]) 01/17/2022   History of colonic polyps    Polyp of sigmoid colon    Moderate episode of recurrent major depressive disorder ([place]) 11/24/2021   OSA (obstructive sleep apnea) 09/18/2020   Aortic atherosclerosis ([place]) 06/10/2020   Hepatic steatosis 06/10/2020   HFrEF (heart failure with reduced ejection fraction) ([place]) 06/10/2020   Osteoarthritis of left hip 03/16/2020   Type 2 diabetes mellitus with peripheral neuropathy ([place]) 12/11/2019   NICM (nonischemic cardiomyopathy) ([place]) 09/11/2019   CAD (coronary artery disease), native coronary artery 09/11/2019   Hypertension associated with diabetes ([place]) 06/05/2019   Morbid obesity ([place]) 10/03/2018   Anemia 10/03/2018   COPD (chronic obstructive pulmonary disease) ([place]) 03/13/2018   Insomnia 11/06/2017   Hyperlipidemia associated with type 2 diabetes mellitus ([place]) 07/19/2017   S/P vaginal hysterectomy 12/17/2016   Vaginal atrophy 10/31/2016   Hx of colonic polyps    DDD (degenerative disc disease), lumbar 02/21/2016   Low back pain 02/14/2016   GAD (generalized anxiety disorder) 06/06/2015   MDD (major depressive disorder) 06/06/2015   Gastroesophageal reflux disease 06/06/2015    Immunization History  Administered Date(s) Administered   Fluad Quad(high Dose 65+) 07/26/2021   Hepatitis A, Adult 02/05/2022, 03/08/2022   Influenza, High  Dose Seasonal PF 06/16/2019, 07/04/2020   Influenza, Seasonal, Injecte, Preservative Fre 08/18/2008, 07/14/2014   Influenza,inj,Quad PF,6+ Mos 09/25/2013, 08/05/2015, 08/02/2016,  07/24/2017   Moderna Sars-Covid-2 Vaccination 12/02/2019, 12/30/2019, 10/29/2020   Pneumococcal Conjugate-13 03/13/2018   Pneumococcal Polysaccharide-23 08/06/2019   Tdap 08/08/2009, 05/13/2014    Conditions to be addressed/monitored: HLD DM HFrEF HTN  Care Plan : [facility]  Updates made by Madelin Rear, [facility] since 03/27/2022 12:00 AM     Problem: hld copd htn dm2 copd hfref      Long-Range Goal: disease management   Start Date: 03/26/2022  This Visit's Progress: On track  Note:   Current Barriers:  Unable to independently afford treatment regimen Unable to achieve control of DM   Pharmacist Clinical Goal(s):  Patient will verbalize ability to afford treatment regimen achieve adherence to monitoring guidelines and medication adherence to achieve therapeutic efficacy through collaboration with PharmD and provider.   Interventions: 1:1 collaboration with [name], NP regarding development and update of comprehensive plan of care as evidenced by provider attestation and co-signature Inter-disciplinary care team collaboration (see longitudinal plan of care) Comprehensive medication review performed; medication list updated in electronic medical record  Diabetes (A1c goal <8%) -Uncontrolled -Current medications: Trulicity 4.5 mg units Appropriate, query effective Basaglar 10 units into the skin daily, increase by 2 units daily until fasting blood sugar less than 150 Appropriate, query effective -Medications previously tried: farxiga - yeast infxn,   -Current home glucose readings fasting glucose: 200+ post prandial glucose:  -Denies hypoglycemic/hyperglycemic symptoms -Current meal patterns: salads, bananas, eggs, vegetables. Coffee, tea.  -Current exercise: limited -Educated on A1c and blood sugar goals; -Counseled to check feet daily and get yearly eye exams -Counseled on diet and exercise extensively Recommended to continue current  medication Currently enrolled in [place] PAP, confirmed that program has rec'd new rx for trulicity 4.5 mg once weekly  Heart Failure (Goal: manage symptoms and prevent exacerbations) -Controlled -Last ejection fraction: 35-40% (Date: 03/20/21) -HF type: Left Ventricular Failure -NYHA Class: II (slight limitation of activity) -AHA HF Stage: C (Heart disease and symptoms present) -Current treatment: Metoprolol Appropriate, effective, save, accessible Torsemide Appropriate, effective, save, accessible Spironolactone Appropriate, effective, save, accessible Digoxin  Appropriate, effective, save, accessible Amiodarone  Appropriate, effective, save, accessible -Medications previously tried: [place] - yeast infxn, entresto - cough  Counseled on appropriate bp monitoring at home  -Current dietary habits:  -Current exercise habits:  -Educated on Benefits of medications for managing symptoms and prolonging life Proper diuretic administration and potassium supplementation Importance of blood pressure control -Counseled on diet and exercise extensively Recommended to continue current medication  Patient Goals/Self-Care Activities Patient will:  - take medications as prescribed as evidenced by patient report and record review collaborate with provider on medication access solutions  Medication Assistance: Trulicity + basaglar (pending) obtained through [employer] medication assistance program.  Enrollment ends 09/2022      Patient's preferred pharmacy is:  [place], [place] - [place] [address] Phone: [phone] Fax: [phone]  [facility] ([place], [place] [facility] [address] [place] [address] [place] [address] Phone: [phone] Fax: [phone]  [facility] [place], [place] - [address] [place] [address] Phone: [phone] Fax: [phone]  [facility] ([facility] ) - [place], [place] -  [place] [address] Phone: [phone] Fax: [phone]   Pt endorses 100% compliance  Follow Up:  Patient agrees to Care Plan and Follow-up. Plan: CP f/u 1-2 M, [facility] PRN  Future Appointments  Date Time Provider [place]  05/24/2022  9:40 AM [name], NP CFP-CFP PEC  06/04/2022  2:00 PM CFP CCM PHARMACY CFP-CFP PEC  06/22/2022 10:40 AM Larey Dresser, MD [facility] None  08/09/2022  9:00 AM AGI Meta NURSE AGI-[facility] None  09/21/2022  8:15 AM Edrick Kins, DPM [facility]    Madelin Rear, PharmD, BCGP Clinical Pharmacist  [facility]  [phone]

## 2022-03-26 NOTE — Patient Instructions (Signed)
Ms. Pantaleon,  Thank you for talking with me today. I have included our care plan/goals in the following pages.   Please review and call me at [phone] with any questions.  Thanks! Ellin Mayhew, PharmD Clinical Pharmacist  [phone]  There are no care plans that you recently modified to display for this patient.   The patient verbalized understanding of instructions provided today and agreed to receive a MyChart copy of patient instruction and/or educational materials. Telephone follow up appointment with pharmacy team member scheduled for: See next appointment with "Care Management Staff" under "What's Next" below.

## 2022-03-29 ENCOUNTER — Ambulatory Visit: Payer: Self-pay

## 2022-03-29 ENCOUNTER — Telehealth: Payer: Self-pay | Admitting: Nurse Practitioner

## 2022-03-29 ENCOUNTER — Other Ambulatory Visit: Payer: Self-pay | Admitting: Physician Assistant

## 2022-03-29 DIAGNOSIS — E785 Hyperlipidemia, unspecified: Secondary | ICD-10-CM

## 2022-03-29 NOTE — Telephone Encounter (Signed)
Summary: no energy, feels weak, and is nauseated/Med refill   Pt husband Lili care advised him to call and request a refill for pt medication, Basaglars insulin. Pt husband stated she has no energy, feels weak, and is nauseated.   Pt seeking clinical advice.     Pt's husband frustrated that Basaglar insulin has not been ordered. Blood sugars over 200 and pt weak, nauseated. Made appt for pt at PCP first available and put pt on wait list for earlier appt.  Sent this note to office high priority.

## 2022-03-30 ENCOUNTER — Other Ambulatory Visit: Payer: Self-pay | Admitting: Physician Assistant

## 2022-03-30 DIAGNOSIS — E785 Hyperlipidemia, unspecified: Secondary | ICD-10-CM

## 2022-04-02 ENCOUNTER — Other Ambulatory Visit: Payer: Self-pay | Admitting: Nurse Practitioner

## 2022-04-02 NOTE — Telephone Encounter (Signed)
Medication Refill - Medication: QUEtiapine (SEROQUEL) 200 MG tablet  Has the patient contacted their pharmacy? No. No, more refills   (Agent: If no, request that the patient contact the pharmacy for the refill. If patient does not wish to contact the pharmacy document the reason why and proceed with request.)   Preferred Pharmacy (with phone number or street name):  [facility] ([facility] ) - [place], [place]  [place] [address]  Phone: [phone] Fax: [phone]  Hours: Not open 24 hours   Has the patient been seen for an appointment in the last year OR does the patient have an upcoming appointment? Yes.    Agent: Please be advised that RX refills may take up to 3 business days. We ask that you follow-up with your pharmacy.

## 2022-04-02 NOTE — Telephone Encounter (Signed)
Information has been completed for patient to get information.  We are just waiting for them to ship it out.

## 2022-04-03 MED ORDER — QUETIAPINE FUMARATE 200 MG PO TABS
200.0000 mg | ORAL_TABLET | Freq: Every day | ORAL | 1 refills | Status: DC
Start: 2022-04-03 — End: 2022-04-13

## 2022-04-03 NOTE — Addendum Note (Signed)
Addended by: [name] on: 04/03/2022 09:27 AM   Modules accepted: Orders

## 2022-04-03 NOTE — Telephone Encounter (Signed)
Patient is asking if she needs to keep her appt with you this week, I reached out and let her know we are waiting for her medication to be shipped out. Pt states the reason why she made her appt this week was to talk about her diabeteic medication but if we have sent everything over and waiting for meds to be shipped, she is wondering if she needs to keep appt. Also, pt asking if you can send in rx for seroquel. Please advise

## 2022-04-03 NOTE — Telephone Encounter (Signed)
Requested medication (s) are due for refill today: No  Requested medication (s) are on the active medication list: Yes  Last refill:  04/03/22  Future visit scheduled: No  Notes to clinic:  Pt. Requests medication be sent to [facility].    Requested Prescriptions  Pending Prescriptions Disp Refills   QUEtiapine (SEROQUEL) 200 MG tablet 90 tablet 1    Sig: Take 1 tablet (200 mg total) by mouth daily. Patient takes at bed time.     Not Delegated - Psychiatry:  Antipsychotics - Second Generation (Atypical) - quetiapine Failed - 04/02/2022  4:13 PM      Failed - This refill cannot be delegated      Failed - TSH in normal range and within 360 days    TSH  Date Value Ref Range Status  12/06/2021 5.816 (H) 0.350 - 4.500 uIU/mL Final    Comment:    Performed by a 3rd Generation assay with a functional sensitivity of <=0.01 uIU/mL. Performed at [facility], [place] [address] [place], [place] [address]   11/24/2021 5.370 (H) 0.450 - 4.500 uIU/mL Final         Failed - Lipid Panel in normal range within the last 12 months    Cholesterol, Total  Date Value Ref Range Status  02/21/2022 69 (L) 100 - 199 mg/dL Final   LDL Cholesterol (Calc)  Date Value Ref Range Status  01/01/2018 29 mg/dL (calc) Final    Comment:    Reference range: <100 . Desirable range <100 mg/dL for primary prevention;   <70 mg/dL for patients with CHD or diabetic patients  with > or = 2 CHD risk factors. Marland Kitchen LDL-C is now calculated using the Martin-Hopkins  calculation, which is a validated novel method providing  better accuracy than the Friedewald equation in the  estimation of LDL-C.  Cresenciano Genre et al. Annamaria Helling. 4128;786(76): 2061-2068  (http://education.[facility].com/faq/FAQ164)    LDL Chol Calc (NIH)  Date Value Ref Range Status  02/21/2022 11 0 - 99 mg/dL Final   HDL  Date Value Ref Range Status  02/21/2022 29 (L) >39 mg/dL Final   Triglycerides  Date Value Ref Range Status  02/21/2022  183 (H) 0 - 149 mg/dL Final         Passed - Completed PHQ-2 or PHQ-9 in the last 360 days      Passed - Last BP in normal range    BP Readings from Last 1 Encounters:  03/20/22 126/64         Passed - Last Heart Rate in normal range    Pulse Readings from Last 1 Encounters:  03/20/22 70         Passed - Valid encounter within last 6 months    Recent Outpatient Visits           1 month ago Chronic systolic heart failure ([place])   [facility] [name], NP   1 month ago Type 2 diabetes mellitus with peripheral neuropathy ([place])   [facility] [name], NP   2 months ago Type 2 diabetes mellitus with peripheral neuropathy ([place])   [facility] [name], NP   4 months ago NICM (nonischemic cardiomyopathy) [facility])   [place], Karen, NP   6 months ago Yeast vaginitis   [facility] [name], NP       Future Appointments             In 1 month Sam Rayburn,  [name], NP [facility]   In 4 months  [place] [facility] [place]            Passed - CBC within normal limits and completed in the last 12 months    WBC  Date Value Ref Range Status  11/24/2021 6.1 3.4 - 10.8 x10E3/uL Final  05/22/2021 8.5 4.0 - 10.5 K/uL Final   RBC  Date Value Ref Range Status  11/24/2021 5.40 (H) 3.77 - 5.28 x10E6/uL Final  05/22/2021 5.72 (H) 3.87 - 5.11 MIL/uL Final   Hemoglobin  Date Value Ref Range Status  11/24/2021 15.2 11.1 - 15.9 g/dL Final   Hematocrit  Date Value Ref Range Status  11/24/2021 48.6 (H) 34.0 - 46.6 % Final   MCHC  Date Value Ref Range Status  11/24/2021 31.3 (L) 31.5 - 35.7 g/dL Final  05/22/2021 30.7 30.0 - 36.0 g/dL Final   [employer]  Date Value Ref Range Status  11/24/2021 28.1 26.6 - 33.0 pg Final  05/22/2021 26.4 26.0 - 34.0 pg Final   MCV  Date Value Ref Range Status  11/24/2021 90 79 - 97 fL Final  02/24/2013 95 80 - 100 fL Final   No  results found for: "PLTCOUNTKUC", "LABPLAT", "POCPLA" RDW  Date Value Ref Range Status  11/24/2021 14.4 11.7 - 15.4 % Final  02/24/2013 13.0 11.5 - 14.5 % Final         Passed - CMP within normal limits and completed in the last 12 months    Albumin  Date Value Ref Range Status  02/21/2022 4.7 3.8 - 4.8 g/dL Final  02/24/2013 3.6 3.4 - 5.0 g/dL Final   Alkaline Phosphatase  Date Value Ref Range Status  02/21/2022 175 (H) 44 - 121 IU/L Final  02/24/2013 113 50 - 136 Unit/L Final   Alkaline phosphatase (APISO)  Date Value Ref Range Status  01/01/2018 92 33 - 130 U/L Final   ALT  Date Value Ref Range Status  02/21/2022 106 (H) 0 - 32 IU/L Final   SGPT (ALT)  Date Value Ref Range Status  02/24/2013 94 (H) 12 - 78 U/L Final   AST  Date Value Ref Range Status  02/21/2022 92 (H) 0 - 40 IU/L Final   SGOT(AST)  Date Value Ref Range Status  02/24/2013 58 (H) 15 - 37 Unit/L Final   BUN  Date Value Ref Range Status  02/21/2022 15 8 - 27 mg/dL Final  02/24/2013 11 7 - 18 mg/dL Final   Calcium  Date Value Ref Range Status  02/21/2022 9.1 8.7 - 10.3 mg/dL Final   Calcium, Total  Date Value Ref Range Status  02/24/2013 8.6 8.5 - 10.1 mg/dL Final   CO2  Date Value Ref Range Status  02/21/2022 19 (L) 20 - 29 mmol/L Final   Co2  Date Value Ref Range Status  02/24/2013 26 21 - 32 mmol/L Final   Bicarbonate  Date Value Ref Range Status  01/06/2018 24.3 20.0 - 28.0 mmol/L Final   Creat  Date Value Ref Range Status  01/01/2018 0.82 0.50 - 0.99 mg/dL Final    Comment:    For patients >46 years of age, the reference limit for Creatinine is approximately 13% higher for people identified as African-American. .    Creatinine, Ser  Date Value Ref Range Status  02/21/2022 0.70 0.57 - 1.00 mg/dL Final   Glucose  Date Value Ref Range Status  02/21/2022 271 (H) 70 - 99 mg/dL Final  02/24/2013 87 65 -  99 mg/dL Final   Glucose, Bld  Date Value Ref Range Status   02/16/2022 367 (H) 70 - 99 mg/dL Final    Comment:    Glucose reference range applies only to samples taken after fasting for at least 8 hours.   Glucose-Capillary  Date Value Ref Range Status  01/04/2022 327 (H) 70 - 99 mg/dL Final    Comment:    Glucose reference range applies only to samples taken after fasting for at least 8 hours.   Potassium  Date Value Ref Range Status  02/21/2022 4.6 3.5 - 5.2 mmol/L Final  02/24/2013 4.1 3.5 - 5.1 mmol/L Final   Sodium  Date Value Ref Range Status  02/21/2022 139 134 - 144 mmol/L Final  02/24/2013 143 136 - 145 mmol/L Final   Bilirubin,Total  Date Value Ref Range Status  02/24/2013 0.4 0.2 - 1.0 mg/dL Final   Bilirubin Total  Date Value Ref Range Status  02/21/2022 0.6 0.0 - 1.2 mg/dL Final   Bilirubin, Direct  Date Value Ref Range Status  01/30/2022 0.4 (H) 0.0 - 0.2 mg/dL Final   Indirect Bilirubin  Date Value Ref Range Status  01/30/2022 0.6 0.3 - 0.9 mg/dL Final    Comment:    Performed at [facility], [address] [place], [place] [address]   Protein, ur  Date Value Ref Range Status  01/04/2018 NEGATIVE NEGATIVE mg/dL Final   Protein,UA  Date Value Ref Range Status  09/20/2021 Negative Negative/Trace Final   Total Protein  Date Value Ref Range Status  02/21/2022 7.3 6.0 - 8.5 g/dL Final  02/24/2013 7.2 6.4 - 8.2 g/dL Final   GFR, Est African American  Date Value Ref Range Status  01/01/2018 88 > OR = 60 mL/min/1.61m Final   GFR calc Af Amer  Date Value Ref Range Status  10/17/2020 79 >59 mL/min/1.73 Final    Comment:    **In accordance with recommendations from the NKF-ASN Task force,**   [facility] is in the process of updating its eGFR calculation to the   2021 CKD-EPI creatinine equation that estimates kidney function   without a race variable.    eGFR  Date Value Ref Range Status  02/21/2022 94 >59 mL/min/1.73 Final   GFR, Est Non African American  Date Value Ref Range Status   01/01/2018 76 > OR = 60 mL/min/1.742mFinal   GFR, Estimated  Date Value Ref Range Status  02/16/2022 >60 >60 mL/min Final    Comment:    (NOTE) Calculated using the CKD-EPI Creatinine Equation (2021)

## 2022-04-03 NOTE — Telephone Encounter (Signed)
Spoke with pt regarding medication Seroquel being sent to pharmacy and cancelled upon pt's request for her appt this week. No further questions per patient.

## 2022-04-03 NOTE — Telephone Encounter (Signed)
She can cancel the appt until she gets the medication in.    Seroquel sent to the pharmacy.

## 2022-04-05 ENCOUNTER — Ambulatory Visit: Payer: Medicare Other | Admitting: Nurse Practitioner

## 2022-04-09 ENCOUNTER — Other Ambulatory Visit: Payer: Self-pay | Admitting: Nurse Practitioner

## 2022-04-09 NOTE — Telephone Encounter (Signed)
Requested medication (s) are due for refill today- no  Requested medication (s) are on the active medication list -yes  Future visit scheduled -yes  Last refill: 04/03/22 #90 1RF  Notes to clinic: Patient requesting different pharmacy- non delegated Rx  Requested Prescriptions  Pending Prescriptions Disp Refills   QUEtiapine (SEROQUEL) 200 MG tablet 90 tablet 1    Sig: Take 1 tablet (200 mg total) by mouth daily. Patient takes at bed time.     Not Delegated - Psychiatry:  Antipsychotics - Second Generation (Atypical) - quetiapine Failed - 04/09/2022 10:28 AM      Failed - This refill cannot be delegated      Failed - TSH in normal range and within 360 days    TSH  Date Value Ref Range Status  12/06/2021 5.816 (H) 0.350 - 4.500 uIU/mL Final    Comment:    Performed by a 3rd Generation assay with a functional sensitivity of <=0.01 uIU/mL. Performed at [facility], [place] [address] [place], [place] [address]   11/24/2021 5.370 (H) 0.450 - 4.500 uIU/mL Final         Failed - Lipid Panel in normal range within the last 12 months    Cholesterol, Total  Date Value Ref Range Status  02/21/2022 69 (L) 100 - 199 mg/dL Final   LDL Cholesterol (Calc)  Date Value Ref Range Status  01/01/2018 29 mg/dL (calc) Final    Comment:    Reference range: <100 . Desirable range <100 mg/dL for primary prevention;   <70 mg/dL for patients with CHD or diabetic patients  with > or = 2 CHD risk factors. Marland Kitchen LDL-C is now calculated using the Martin-Hopkins  calculation, which is a validated novel method providing  better accuracy than the Friedewald equation in the  estimation of LDL-C.  Cresenciano Genre et al. Annamaria Helling. 1478;295(62): 2061-2068  (http://education.[facility].com/faq/FAQ164)    LDL Chol Calc (NIH)  Date Value Ref Range Status  02/21/2022 11 0 - 99 mg/dL Final   HDL  Date Value Ref Range Status  02/21/2022 29 (L) >39 mg/dL Final   Triglycerides  Date Value Ref Range Status   02/21/2022 183 (H) 0 - 149 mg/dL Final         Passed - Completed PHQ-2 or PHQ-9 in the last 360 days      Passed - Last BP in normal range    BP Readings from Last 1 Encounters:  03/20/22 126/64         Passed - Last Heart Rate in normal range    Pulse Readings from Last 1 Encounters:  03/20/22 70         Passed - Valid encounter within last 6 months    Recent Outpatient Visits           1 month ago Chronic systolic heart failure ([place])   [facility] [name], NP   1 month ago Type 2 diabetes mellitus with peripheral neuropathy ([place])   [facility] [name], NP   3 months ago Type 2 diabetes mellitus with peripheral neuropathy ([place])   [facility] [name], NP   4 months ago NICM (nonischemic cardiomyopathy) [facility])   [place], Karen, NP   6 months ago Yeast vaginitis   [facility] [name], NP       Future Appointments             In 1 month [name], NP [facility]   In 4 months  [place] [facility] [place]            Passed - CBC within normal limits and completed in the last 12 months    WBC  Date Value Ref Range Status  11/24/2021 6.1 3.4 - 10.8 x10E3/uL Final  05/22/2021 8.5 4.0 - 10.5 K/uL Final   RBC  Date Value Ref Range Status  11/24/2021 5.40 (H) 3.77 - 5.28 x10E6/uL Final  05/22/2021 5.72 (H) 3.87 - 5.11 MIL/uL Final   Hemoglobin  Date Value Ref Range Status  11/24/2021 15.2 11.1 - 15.9 g/dL Final   Hematocrit  Date Value Ref Range Status  11/24/2021 48.6 (H) 34.0 - 46.6 % Final   MCHC  Date Value Ref Range Status  11/24/2021 31.3 (L) 31.5 - 35.7 g/dL Final  05/22/2021 30.7 30.0 - 36.0 g/dL Final   [facility]  Date Value Ref Range Status  11/24/2021 28.1 26.6 - 33.0 pg Final  05/22/2021 26.4 26.0 - 34.0 pg Final   MCV  Date Value Ref Range Status  11/24/2021 90 79 - 97 fL Final  02/24/2013 95 80 - 100 fL  Final   No results found for: "PLTCOUNTKUC", "LABPLAT", "POCPLA" RDW  Date Value Ref Range Status  11/24/2021 14.4 11.7 - 15.4 % Final  02/24/2013 13.0 11.5 - 14.5 % Final         Passed - CMP within normal limits and completed in the last 12 months    Albumin  Date Value Ref Range Status  02/21/2022 4.7 3.8 - 4.8 g/dL Final  02/24/2013 3.6 3.4 - 5.0 g/dL Final   Alkaline Phosphatase  Date Value Ref Range Status  02/21/2022 175 (H) 44 - 121 IU/L Final  02/24/2013 113 50 - 136 Unit/L Final   Alkaline phosphatase (APISO)  Date Value Ref Range Status  01/01/2018 92 33 - 130 U/L Final   ALT  Date Value Ref Range Status  02/21/2022 106 (H) 0 - 32 IU/L Final   SGPT (ALT)  Date Value Ref Range Status  02/24/2013 94 (H) 12 - 78 U/L Final   AST  Date Value Ref Range Status  02/21/2022 92 (H) 0 - 40 IU/L Final   SGOT(AST)  Date Value Ref Range Status  02/24/2013 58 (H) 15 - 37 Unit/L Final   BUN  Date Value Ref Range Status  02/21/2022 15 8 - 27 mg/dL Final  02/24/2013 11 7 - 18 mg/dL Final   Calcium  Date Value Ref Range Status  02/21/2022 9.1 8.7 - 10.3 mg/dL Final   Calcium, Total  Date Value Ref Range Status  02/24/2013 8.6 8.5 - 10.1 mg/dL Final   CO2  Date Value Ref Range Status  02/21/2022 19 (L) 20 - 29 mmol/L Final   Co2  Date Value Ref Range Status  02/24/2013 26 21 - 32 mmol/L Final   Bicarbonate  Date Value Ref Range Status  01/06/2018 24.3 20.0 - 28.0 mmol/L Final   Creat  Date Value Ref Range Status  01/01/2018 0.82 0.50 - 0.99 mg/dL Final    Comment:    For patients >42 years of age, the reference limit for Creatinine is approximately 13% higher for people identified as African-American. .    Creatinine, Ser  Date Value Ref Range Status  02/21/2022 0.70 0.57 - 1.00 mg/dL Final   Glucose  Date Value Ref Range Status  02/21/2022 271 (H) 70 - 99 mg/dL Final  02/24/2013 87 65 - 99 mg/dL Final  Glucose, Bld  Date Value Ref Range  Status  02/16/2022 367 (H) 70 - 99 mg/dL Final    Comment:    Glucose reference range applies only to samples taken after fasting for at least 8 hours.   Glucose-Capillary  Date Value Ref Range Status  01/04/2022 327 (H) 70 - 99 mg/dL Final    Comment:    Glucose reference range applies only to samples taken after fasting for at least 8 hours.   Potassium  Date Value Ref Range Status  02/21/2022 4.6 3.5 - 5.2 mmol/L Final  02/24/2013 4.1 3.5 - 5.1 mmol/L Final   Sodium  Date Value Ref Range Status  02/21/2022 139 134 - 144 mmol/L Final  02/24/2013 143 136 - 145 mmol/L Final   Bilirubin,Total  Date Value Ref Range Status  02/24/2013 0.4 0.2 - 1.0 mg/dL Final   Bilirubin Total  Date Value Ref Range Status  02/21/2022 0.6 0.0 - 1.2 mg/dL Final   Bilirubin, Direct  Date Value Ref Range Status  01/30/2022 0.4 (H) 0.0 - 0.2 mg/dL Final   Indirect Bilirubin  Date Value Ref Range Status  01/30/2022 0.6 0.3 - 0.9 mg/dL Final    Comment:    Performed at [facility], [address] [place], [place] [address]   Protein, ur  Date Value Ref Range Status  01/04/2018 NEGATIVE NEGATIVE mg/dL Final   Protein,UA  Date Value Ref Range Status  09/20/2021 Negative Negative/Trace Final   Total Protein  Date Value Ref Range Status  02/21/2022 7.3 6.0 - 8.5 g/dL Final  02/24/2013 7.2 6.4 - 8.2 g/dL Final   GFR, Est African American  Date Value Ref Range Status  01/01/2018 88 > OR = 60 mL/min/1.22m Final   GFR calc Af Amer  Date Value Ref Range Status  10/17/2020 79 >59 mL/min/1.73 Final    Comment:    **In accordance with recommendations from the NKF-ASN Task force,**   [facility] is in the process of updating its eGFR calculation to the   2021 CKD-EPI creatinine equation that estimates kidney function   without a race variable.    eGFR  Date Value Ref Range Status  02/21/2022 94 >59 mL/min/1.73 Final   GFR, Est Non African American  Date Value Ref Range Status   01/01/2018 76 > OR = 60 mL/min/1.766mFinal   GFR, Estimated  Date Value Ref Range Status  02/16/2022 >60 >60 mL/min Final    Comment:    (NOTE) Calculated using the CKD-EPI Creatinine Equation (2021)             Requested Prescriptions  Pending Prescriptions Disp Refills   QUEtiapine (SEROQUEL) 200 MG tablet 90 tablet 1    Sig: Take 1 tablet (200 mg total) by mouth daily. Patient takes at bed time.     Not Delegated - Psychiatry:  Antipsychotics - Second Generation (Atypical) - quetiapine Failed - 04/09/2022 10:28 AM      Failed - This refill cannot be delegated      Failed - TSH in normal range and within 360 days    TSH  Date Value Ref Range Status  12/06/2021 5.816 (H) 0.350 - 4.500 uIU/mL Final    Comment:    Performed by a 3rd Generation assay with a functional sensitivity of <=0.01 uIU/mL. Performed at [facility] [place] [id number] 11/24/2021 5.370 (H) 0.450 - 4.500 uIU/mL Final         Failed - Lipid  Panel in normal range within the last 12 months    Cholesterol, Total  Date Value Ref Range Status  02/21/2022 69 (L) 100 - 199 mg/dL Final   LDL Cholesterol (Calc)  Date Value Ref Range Status  01/01/2018 29 mg/dL (calc) Final    Comment:    Reference range: <100 . Desirable range <100 mg/dL for primary prevention;   <70 mg/dL for patients with CHD or diabetic patients  with > or = 2 CHD risk factors. Marland Kitchen LDL-C is now calculated using the Martin-Hopkins  calculation, which is a validated novel method providing  better accuracy than the Friedewald equation in the  estimation of LDL-C.  Cresenciano Genre et al. Annamaria Helling. 4010;272(53): 2061-2068  (http://education.[facility].com/faq/FAQ164)    LDL Chol Calc (NIH)  Date Value Ref Range Status  02/21/2022 11 0 - 99 mg/dL Final   HDL  Date Value Ref Range Status  02/21/2022 29 (L) >39 mg/dL Final   Triglycerides  Date Value Ref Range Status  02/21/2022 183 (H) 0 - 149  mg/dL Final         Passed - Completed PHQ-2 or PHQ-9 in the last 360 days      Passed - Last BP in normal range    BP Readings from Last 1 Encounters:  03/20/22 126/64         Passed - Last Heart Rate in normal range    Pulse Readings from Last 1 Encounters:  03/20/22 70         Passed - Valid encounter within last 6 months    Recent Outpatient Visits           1 month ago Chronic systolic heart failure ([place])   [facility] [name], NP   1 month ago Type 2 diabetes mellitus with peripheral neuropathy ([place])   [facility] [name], NP   3 months ago Type 2 diabetes mellitus with peripheral neuropathy ([place])   [facility] [name], NP   4 months ago NICM (nonischemic cardiomyopathy) ([place])   [place], Karen, NP   6 months ago Yeast vaginitis   [facility] [name], NP       Future Appointments             In 1 month [name], NP [facility]   In 4 months  [place] [facility] [place]            Passed - CBC within normal limits and completed in the last 12 months    WBC  Date Value Ref Range Status  11/24/2021 6.1 3.4 - 10.8 x10E3/uL Final  05/22/2021 8.5 4.0 - 10.5 K/uL Final   RBC  Date Value Ref Range Status  11/24/2021 5.40 (H) 3.77 - 5.28 x10E6/uL Final  05/22/2021 5.72 (H) 3.87 - 5.11 MIL/uL Final   Hemoglobin  Date Value Ref Range Status  11/24/2021 15.2 11.1 - 15.9 g/dL Final   Hematocrit  Date Value Ref Range Status  11/24/2021 48.6 (H) 34.0 - 46.6 % Final   MCHC  Date Value Ref Range Status  11/24/2021 31.3 (L) 31.5 - 35.7 g/dL Final  05/22/2021 30.7 30.0 - 36.0 g/dL Final   [facility]  Date Value Ref Range Status  11/24/2021 28.1 26.6 - 33.0 pg Final  05/22/2021 26.4 26.0 - 34.0 pg Final   MCV  Date Value Ref Range Status  11/24/2021 90 79 - 97 fL Final  02/24/2013 95 80 - 100 fL Final  No results found  for: "PLTCOUNTKUC", "LABPLAT", "POCPLA" RDW  Date Value Ref Range Status  11/24/2021 14.4 11.7 - 15.4 % Final  02/24/2013 13.0 11.5 - 14.5 % Final         Passed - CMP within normal limits and completed in the last 12 months    Albumin  Date Value Ref Range Status  02/21/2022 4.7 3.8 - 4.8 g/dL Final  02/24/2013 3.6 3.4 - 5.0 g/dL Final   Alkaline Phosphatase  Date Value Ref Range Status  02/21/2022 175 (H) 44 - 121 IU/L Final  02/24/2013 113 50 - 136 Unit/L Final   Alkaline phosphatase (APISO)  Date Value Ref Range Status  01/01/2018 92 33 - 130 U/L Final   ALT  Date Value Ref Range Status  02/21/2022 106 (H) 0 - 32 IU/L Final   SGPT (ALT)  Date Value Ref Range Status  02/24/2013 94 (H) 12 - 78 U/L Final   AST  Date Value Ref Range Status  02/21/2022 92 (H) 0 - 40 IU/L Final   SGOT(AST)  Date Value Ref Range Status  02/24/2013 58 (H) 15 - 37 Unit/L Final   BUN  Date Value Ref Range Status  02/21/2022 15 8 - 27 mg/dL Final  02/24/2013 11 7 - 18 mg/dL Final   Calcium  Date Value Ref Range Status  02/21/2022 9.1 8.7 - 10.3 mg/dL Final   Calcium, Total  Date Value Ref Range Status  02/24/2013 8.6 8.5 - 10.1 mg/dL Final   CO2  Date Value Ref Range Status  02/21/2022 19 (L) 20 - 29 mmol/L Final   Co2  Date Value Ref Range Status  02/24/2013 26 21 - 32 mmol/L Final   Bicarbonate  Date Value Ref Range Status  01/06/2018 24.3 20.0 - 28.0 mmol/L Final   Creat  Date Value Ref Range Status  01/01/2018 0.82 0.50 - 0.99 mg/dL Final    Comment:    For patients >57 years of age, the reference limit for Creatinine is approximately 13% higher for people identified as African-American. .    Creatinine, Ser  Date Value Ref Range Status  02/21/2022 0.70 0.57 - 1.00 mg/dL Final   Glucose  Date Value Ref Range Status  02/21/2022 271 (H) 70 - 99 mg/dL Final  02/24/2013 87 65 - 99 mg/dL Final   Glucose, Bld  Date Value Ref Range Status  02/16/2022 367 (H)  70 - 99 mg/dL Final    Comment:    Glucose reference range applies only to samples taken after fasting for at least 8 hours.   Glucose-Capillary  Date Value Ref Range Status  01/04/2022 327 (H) 70 - 99 mg/dL Final    Comment:    Glucose reference range applies only to samples taken after fasting for at least 8 hours.   Potassium  Date Value Ref Range Status  02/21/2022 4.6 3.5 - 5.2 mmol/L Final  02/24/2013 4.1 3.5 - 5.1 mmol/L Final   Sodium  Date Value Ref Range Status  02/21/2022 139 134 - 144 mmol/L Final  02/24/2013 143 136 - 145 mmol/L Final   Bilirubin,Total  Date Value Ref Range Status  02/24/2013 0.4 0.2 - 1.0 mg/dL Final   Bilirubin Total  Date Value Ref Range Status  02/21/2022 0.6 0.0 - 1.2 mg/dL Final   Bilirubin, Direct  Date Value Ref Range Status  01/30/2022 0.4 (H) 0.0 - 0.2 mg/dL Final   Indirect Bilirubin  Date Value Ref Range Status  01/30/2022 0.6 0.3 -  0.9 mg/dL Final    Comment:    Performed at [facility], [address] [place], [place] [address]   Protein, ur  Date Value Ref Range Status  01/04/2018 NEGATIVE NEGATIVE mg/dL Final   Protein,UA  Date Value Ref Range Status  09/20/2021 Negative Negative/Trace Final   Total Protein  Date Value Ref Range Status  02/21/2022 7.3 6.0 - 8.5 g/dL Final  02/24/2013 7.2 6.4 - 8.2 g/dL Final   GFR, Est African American  Date Value Ref Range Status  01/01/2018 88 > OR = 60 mL/min/1.16m Final   GFR calc Af Amer  Date Value Ref Range Status  10/17/2020 79 >59 mL/min/1.73 Final    Comment:    **In accordance with recommendations from the NKF-ASN Task force,**   [facility] is in the process of updating its eGFR calculation to the   2021 CKD-EPI creatinine equation that estimates kidney function   without a race variable.    eGFR  Date Value Ref Range Status  02/21/2022 94 >59 mL/min/1.73 Final   GFR, Est Non African American  Date Value Ref Range Status  01/01/2018 76 > OR = 60  mL/min/1.762mFinal   GFR, Estimated  Date Value Ref Range Status  02/16/2022 >60 >60 mL/min Final    Comment:    (NOTE) Calculated using the CKD-EPI Creatinine Equation (2021)

## 2022-04-09 NOTE — Telephone Encounter (Signed)
Pt requests that the Rx be sent to [facility] because she does not have the money to get the Rx locally.  Medication Refill - Medication: QUEtiapine (SEROQUEL) 200 MG tablet  Has the patient contacted their pharmacy? Yes.    Preferred Pharmacy (with phone number or street name):  [facility] ([place]) [place], [place] Phone:  [phone]  Fax:  [phone]     Has the patient been seen for an appointment in the last year OR does the patient have an upcoming appointment? Yes.    Agent: Please be advised that RX refills may take up to 3 business days. We ask that you follow-up with your pharmacy.

## 2022-04-10 ENCOUNTER — Other Ambulatory Visit: Payer: Self-pay | Admitting: Nurse Practitioner

## 2022-04-10 NOTE — Telephone Encounter (Signed)
Called [facility] to verify is patient has picked up medication since signed on 04/03/22. Pharmacy staff reports medication has been put on file and has not been picked up by patient at this time. Request now to send Rx [facility]

## 2022-04-10 NOTE — Telephone Encounter (Signed)
Requested medication (s) are due for refill today: yes  Requested medication (s) are on the active medication list: yes  Last refill:  04/03/22 #90 1 refill  Future visit scheduled: yes in 1 month   Notes to clinic:  not delegated per protocol. Recent Rx sent to [facility] on 04/03/22 and called pharmacy , patient has not picked up Rx at this time and med is still on file. Current request to sent Rx to [facility]. Do you want to refill Rx and send to [facility] delivery?     Requested Prescriptions  Pending Prescriptions Disp Refills   QUEtiapine (SEROQUEL) 200 MG tablet [Pharmacy Med Name: QUETIAPINE  200MG  TAB] 90 tablet 3    Sig: TAKE 1 TABLET BY MOUTH DAILY AT  BEDTIME     Not Delegated - Psychiatry:  Antipsychotics - Second Generation (Atypical) - quetiapine Failed - 04/10/2022 11:58 AM      Failed - This refill cannot be delegated      Failed - TSH in normal range and within 360 days    TSH  Date Value Ref Range Status  12/06/2021 5.816 (H) 0.350 - 4.500 uIU/mL Final    Comment:    Performed by a 3rd Generation assay with a functional sensitivity of <=0.01 uIU/mL. Performed at [facility], [place] [address], [place], [place] [address]   11/24/2021 5.370 (H) 0.450 - 4.500 uIU/mL Final         Failed - Lipid Panel in normal range within the last 12 months    Cholesterol, Total  Date Value Ref Range Status  02/21/2022 69 (L) 100 - 199 mg/dL Final   LDL Cholesterol (Calc)  Date Value Ref Range Status  01/01/2018 29 mg/dL (calc) Final    Comment:    Reference range: <100 . Desirable range <100 mg/dL for primary prevention;   <70 mg/dL for patients with CHD or diabetic patients  with > or = 2 CHD risk factors. Marland Kitchen LDL-C is now calculated using the Martin-Hopkins  calculation, which is a validated novel method providing  better accuracy than the Friedewald equation in the  estimation of LDL-C.  Cresenciano Genre et al. Annamaria Helling. 7619;509(32): 2061-2068   (http://education.[facility].com/faq/FAQ164)    LDL Chol Calc (NIH)  Date Value Ref Range Status  02/21/2022 11 0 - 99 mg/dL Final   HDL  Date Value Ref Range Status  02/21/2022 29 (L) >39 mg/dL Final   Triglycerides  Date Value Ref Range Status  02/21/2022 183 (H) 0 - 149 mg/dL Final         Passed - Completed PHQ-2 or PHQ-9 in the last 360 days      Passed - Last BP in normal range    BP Readings from Last 1 Encounters:  03/20/22 126/64         Passed - Last Heart Rate in normal range    Pulse Readings from Last 1 Encounters:  03/20/22 70         Passed - Valid encounter within last 6 months    Recent Outpatient Visits           1 month ago Chronic systolic heart failure ([place])   [facility] [name], NP   1 month ago Type 2 diabetes mellitus with peripheral neuropathy ([place])   [facility] [name], NP   3 months ago Type 2 diabetes mellitus with peripheral neuropathy [facility])   [facility] [name], NP  4 months ago NICM (nonischemic cardiomyopathy) ([place])   [place] [name], NP   6 months ago Yeast vaginitis   [facility] [name], NP       Future Appointments             In 1 month [name], NP [facility]   In 4 months  [facility] [place]            Passed - CBC within normal limits and completed in the last 12 months    WBC  Date Value Ref Range Status  11/24/2021 6.1 3.4 - 10.8 x10E3/uL Final  05/22/2021 8.5 4.0 - 10.5 K/uL Final   RBC  Date Value Ref Range Status  11/24/2021 5.40 (H) 3.77 - 5.28 x10E6/uL Final  05/22/2021 5.72 (H) 3.87 - 5.11 MIL/uL Final   Hemoglobin  Date Value Ref Range Status  11/24/2021 15.2 11.1 - 15.9 g/dL Final   Hematocrit  Date Value Ref Range Status  11/24/2021 48.6 (H) 34.0 - 46.6 % Final   MCHC  Date Value Ref Range Status  11/24/2021 31.3 (L) 31.5 - 35.7  g/dL Final  05/22/2021 30.7 30.0 - 36.0 g/dL Final   [facility]  Date Value Ref Range Status  11/24/2021 28.1 26.6 - 33.0 pg Final  05/22/2021 26.4 26.0 - 34.0 pg Final   MCV  Date Value Ref Range Status  11/24/2021 90 79 - 97 fL Final  02/24/2013 95 80 - 100 fL Final   No results found for: "PLTCOUNTKUC", "LABPLAT", "POCPLA" RDW  Date Value Ref Range Status  11/24/2021 14.4 11.7 - 15.4 % Final  02/24/2013 13.0 11.5 - 14.5 % Final         Passed - CMP within normal limits and completed in the last 12 months    Albumin  Date Value Ref Range Status  02/21/2022 4.7 3.8 - 4.8 g/dL Final  02/24/2013 3.6 3.4 - 5.0 g/dL Final   Alkaline Phosphatase  Date Value Ref Range Status  02/21/2022 175 (H) 44 - 121 IU/L Final  02/24/2013 113 50 - 136 Unit/L Final   Alkaline phosphatase (APISO)  Date Value Ref Range Status  01/01/2018 92 33 - 130 U/L Final   ALT  Date Value Ref Range Status  02/21/2022 106 (H) 0 - 32 IU/L Final   SGPT (ALT)  Date Value Ref Range Status  02/24/2013 94 (H) 12 - 78 U/L Final   AST  Date Value Ref Range Status  02/21/2022 92 (H) 0 - 40 IU/L Final   SGOT(AST)  Date Value Ref Range Status  02/24/2013 58 (H) 15 - 37 Unit/L Final   BUN  Date Value Ref Range Status  02/21/2022 15 8 - 27 mg/dL Final  02/24/2013 11 7 - 18 mg/dL Final   Calcium  Date Value Ref Range Status  02/21/2022 9.1 8.7 - 10.3 mg/dL Final   Calcium, Total  Date Value Ref Range Status  02/24/2013 8.6 8.5 - 10.1 mg/dL Final   CO2  Date Value Ref Range Status  02/21/2022 19 (L) 20 - 29 mmol/L Final   Co2  Date Value Ref Range Status  02/24/2013 26 21 - 32 mmol/L Final   Bicarbonate  Date Value Ref Range Status  01/06/2018 24.3 20.0 - 28.0 mmol/L Final   Creat  Date Value Ref Range Status  01/01/2018 0.82 0.50 - 0.99 mg/dL Final    Comment:    For patients >82 years of age, the reference  limit for Creatinine is approximately 13% higher for people identified as  African-American. .    Creatinine, Ser  Date Value Ref Range Status  02/21/2022 0.70 0.57 - 1.00 mg/dL Final   Glucose  Date Value Ref Range Status  02/21/2022 271 (H) 70 - 99 mg/dL Final  02/24/2013 87 65 - 99 mg/dL Final   Glucose, Bld  Date Value Ref Range Status  02/16/2022 367 (H) 70 - 99 mg/dL Final    Comment:    Glucose reference range applies only to samples taken after fasting for at least 8 hours.   Glucose-Capillary  Date Value Ref Range Status  01/04/2022 327 (H) 70 - 99 mg/dL Final    Comment:    Glucose reference range applies only to samples taken after fasting for at least 8 hours.   Potassium  Date Value Ref Range Status  02/21/2022 4.6 3.5 - 5.2 mmol/L Final  02/24/2013 4.1 3.5 - 5.1 mmol/L Final   Sodium  Date Value Ref Range Status  02/21/2022 139 134 - 144 mmol/L Final  02/24/2013 143 136 - 145 mmol/L Final   Bilirubin,Total  Date Value Ref Range Status  02/24/2013 0.4 0.2 - 1.0 mg/dL Final   Bilirubin Total  Date Value Ref Range Status  02/21/2022 0.6 0.0 - 1.2 mg/dL Final   Bilirubin, Direct  Date Value Ref Range Status  01/30/2022 0.4 (H) 0.0 - 0.2 mg/dL Final   Indirect Bilirubin  Date Value Ref Range Status  01/30/2022 0.6 0.3 - 0.9 mg/dL Final    Comment:    Performed at [facility], [address] [place], [place] [address]   Protein, ur  Date Value Ref Range Status  01/04/2018 NEGATIVE NEGATIVE mg/dL Final   Protein,UA  Date Value Ref Range Status  09/20/2021 Negative Negative/Trace Final   Total Protein  Date Value Ref Range Status  02/21/2022 7.3 6.0 - 8.5 g/dL Final  02/24/2013 7.2 6.4 - 8.2 g/dL Final   GFR, Est African American  Date Value Ref Range Status  01/01/2018 88 > OR = 60 mL/min/1.21m Final   GFR calc Af Amer  Date Value Ref Range Status  10/17/2020 79 >59 mL/min/1.73 Final    Comment:    **In accordance with recommendations from the NKF-ASN Task force,**   [facility] is in the process of  updating its eGFR calculation to the   2021 CKD-EPI creatinine equation that estimates kidney function   without a race variable.    eGFR  Date Value Ref Range Status  02/21/2022 94 >59 mL/min/1.73 Final   GFR, Est Non African American  Date Value Ref Range Status  01/01/2018 76 > OR = 60 mL/min/1.752mFinal   GFR, Estimated  Date Value Ref Range Status  02/16/2022 >60 >60 mL/min Final    Comment:    (NOTE) Calculated using the CKD-EPI Creatinine Equation (2021)

## 2022-04-10 NOTE — Telephone Encounter (Unsigned)
Copied from [place]. Topic: General - Other >> Apr 10, 2022  2:15 PM Everette C wrote: Reason for CRM: Medication Refill - Medication: QUEtiapine (SEROQUEL) 200 MG tablet [[id number]] - patient has 0 tablets remaining   Has the patient contacted their pharmacy? Yes.  The patient has been directed to contact their PCP (Agent: If no, request that the patient contact the pharmacy for the refill. If patient does not wish to contact the pharmacy document the reason why and proceed with request.) (Agent: If yes, when and what did the pharmacy advise?)  Preferred Pharmacy (with phone number or street name): [facility] ([place], [place] [facility] [address] [place] [address] Phone: [phone] Fax: [phone] Hours: Not open 24 hours   Has the patient been seen for an appointment in the last year OR does the patient have an upcoming appointment? Yes.    Agent: Please be advised that RX refills may take up to 3 business days. We ask that you follow-up with your pharmacy.

## 2022-04-11 ENCOUNTER — Telehealth: Payer: Self-pay

## 2022-04-11 ENCOUNTER — Other Ambulatory Visit: Payer: Self-pay | Admitting: Nurse Practitioner

## 2022-04-11 NOTE — Telephone Encounter (Signed)
Requesting RX to be sent to mail order.

## 2022-04-11 NOTE — Telephone Encounter (Signed)
Rx was sent to wrong pharm, pharm called and pt has not asked for refill on new rx. Will change rx to correct mail order pharm.

## 2022-04-11 NOTE — Telephone Encounter (Signed)
Copied from [place] [phone]. Topic: General - Other >> Apr 11, 2022  2:54 PM Ja-Kwan M wrote: Reason for CRM: Pt spouse reports that they are being told by the pharmacy that information is missing so the Rx for insulin is not being filled. Pt spouse stated he does not have the exact name of the insulin but pt has been without it for a month and a half. Pt spouse requests call back once the missing information is provided to the pharmacy. Cb# [phone]

## 2022-04-11 NOTE — Telephone Encounter (Signed)
Requested medication (s) are due for refill today: Kim Buchanan (were sent to local pharm, supposed to be mail order)  Requested medication (s) are on the active medication list: yes (Local pharm states she has not filled any of this rx)  Last refill:  04/03/22 #90 with 1 RF  Future visit scheduled: yes 05/24/22  Notes to clinic:  Needs to be sent to mail order, local pharm canceled it, pt had not requested yet. Not delegated.      Requested Prescriptions  Pending Prescriptions Disp Refills   QUEtiapine (SEROQUEL) 200 MG tablet 90 tablet 1    Sig: Take 1 tablet (200 mg total) by mouth daily. Patient takes at bed time.     Not Delegated - Psychiatry:  Antipsychotics - Second Generation (Atypical) - quetiapine Failed - 04/10/2022  2:41 PM      Failed - This refill cannot be delegated      Failed - TSH in normal range and within 360 days    TSH  Date Value Ref Range Status  12/06/2021 5.816 (H) 0.350 - 4.500 uIU/mL Final    Comment:    Performed by a 3rd Generation assay with a functional sensitivity of <=0.01 uIU/mL. Performed at [facility], [place] [address] [place], [place] [address]   11/24/2021 5.370 (H) 0.450 - 4.500 uIU/mL Final         Failed - Lipid Panel in normal range within the last 12 months    Cholesterol, Total  Date Value Ref Range Status  02/21/2022 69 (L) 100 - 199 mg/dL Final   LDL Cholesterol (Calc)  Date Value Ref Range Status  01/01/2018 29 mg/dL (calc) Final    Comment:    Reference range: <100 . Desirable range <100 mg/dL for primary prevention;   <70 mg/dL for patients with CHD or diabetic patients  with > or = 2 CHD risk factors. Marland Kitchen LDL-C is now calculated using the Martin-Hopkins  calculation, which is a validated novel method providing  better accuracy than the Friedewald equation in the  estimation of LDL-C.  Cresenciano Genre et al. Annamaria Helling. 5170;017(49): 2061-2068  (http://education.[facility].com/faq/FAQ164)    LDL Chol Calc (NIH)  Date Value  Ref Range Status  02/21/2022 11 0 - 99 mg/dL Final   HDL  Date Value Ref Range Status  02/21/2022 29 (L) >39 mg/dL Final   Triglycerides  Date Value Ref Range Status  02/21/2022 183 (H) 0 - 149 mg/dL Final         Passed - Completed PHQ-2 or PHQ-9 in the last 360 days      Passed - Last BP in normal range    BP Readings from Last 1 Encounters:  03/20/22 126/64         Passed - Last Heart Rate in normal range    Pulse Readings from Last 1 Encounters:  03/20/22 70         Passed - Valid encounter within last 6 months    Recent Outpatient Visits           1 month ago Chronic systolic heart failure ([place])   [facility] [name], NP   1 month ago Type 2 diabetes mellitus with peripheral neuropathy ([place])   [facility] [name], NP   3 months ago Type 2 diabetes mellitus with peripheral neuropathy ([place])   [facility] [name], NP   4 months ago NICM (nonischemic cardiomyopathy) [facility])   [facility] [name], NP  6 months ago Yeast vaginitis   [place], NP       Future Appointments             In 1 month [name], NP [facility]   In 4 months  [facility] [place]            Passed - CBC within normal limits and completed in the last 12 months    WBC  Date Value Ref Range Status  11/24/2021 6.1 3.4 - 10.8 x10E3/uL Final  05/22/2021 8.5 4.0 - 10.5 K/uL Final   RBC  Date Value Ref Range Status  11/24/2021 5.40 (H) 3.77 - 5.28 x10E6/uL Final  05/22/2021 5.72 (H) 3.87 - 5.11 MIL/uL Final   Hemoglobin  Date Value Ref Range Status  11/24/2021 15.2 11.1 - 15.9 g/dL Final   Hematocrit  Date Value Ref Range Status  11/24/2021 48.6 (H) 34.0 - 46.6 % Final   MCHC  Date Value Ref Range Status  11/24/2021 31.3 (L) 31.5 - 35.7 g/dL Final  05/22/2021 30.7 30.0 - 36.0 g/dL Final   [facility]  Date Value Ref Range Status   11/24/2021 28.1 26.6 - 33.0 pg Final  05/22/2021 26.4 26.0 - 34.0 pg Final   MCV  Date Value Ref Range Status  11/24/2021 90 79 - 97 fL Final  02/24/2013 95 80 - 100 fL Final   No results found for: "PLTCOUNTKUC", "LABPLAT", "POCPLA" RDW  Date Value Ref Range Status  11/24/2021 14.4 11.7 - 15.4 % Final  02/24/2013 13.0 11.5 - 14.5 % Final         Passed - CMP within normal limits and completed in the last 12 months    Albumin  Date Value Ref Range Status  02/21/2022 4.7 3.8 - 4.8 g/dL Final  02/24/2013 3.6 3.4 - 5.0 g/dL Final   Alkaline Phosphatase  Date Value Ref Range Status  02/21/2022 175 (H) 44 - 121 IU/L Final  02/24/2013 113 50 - 136 Unit/L Final   Alkaline phosphatase (APISO)  Date Value Ref Range Status  01/01/2018 92 33 - 130 U/L Final   ALT  Date Value Ref Range Status  02/21/2022 106 (H) 0 - 32 IU/L Final   SGPT (ALT)  Date Value Ref Range Status  02/24/2013 94 (H) 12 - 78 U/L Final   AST  Date Value Ref Range Status  02/21/2022 92 (H) 0 - 40 IU/L Final   SGOT(AST)  Date Value Ref Range Status  02/24/2013 58 (H) 15 - 37 Unit/L Final   BUN  Date Value Ref Range Status  02/21/2022 15 8 - 27 mg/dL Final  02/24/2013 11 7 - 18 mg/dL Final   Calcium  Date Value Ref Range Status  02/21/2022 9.1 8.7 - 10.3 mg/dL Final   Calcium, Total  Date Value Ref Range Status  02/24/2013 8.6 8.5 - 10.1 mg/dL Final   CO2  Date Value Ref Range Status  02/21/2022 19 (L) 20 - 29 mmol/L Final   Co2  Date Value Ref Range Status  02/24/2013 26 21 - 32 mmol/L Final   Bicarbonate  Date Value Ref Range Status  01/06/2018 24.3 20.0 - 28.0 mmol/L Final   Creat  Date Value Ref Range Status  01/01/2018 0.82 0.50 - 0.99 mg/dL Final    Comment:    For patients >9 years of age, the reference limit for Creatinine is approximately 13% higher for people identified as African-American. .    Creatinine,  Ser  Date Value Ref Range Status  02/21/2022 0.70 0.57 -  1.00 mg/dL Final   Glucose  Date Value Ref Range Status  02/21/2022 271 (H) 70 - 99 mg/dL Final  02/24/2013 87 65 - 99 mg/dL Final   Glucose, Bld  Date Value Ref Range Status  02/16/2022 367 (H) 70 - 99 mg/dL Final    Comment:    Glucose reference range applies only to samples taken after fasting for at least 8 hours.   Glucose-Capillary  Date Value Ref Range Status  01/04/2022 327 (H) 70 - 99 mg/dL Final    Comment:    Glucose reference range applies only to samples taken after fasting for at least 8 hours.   Potassium  Date Value Ref Range Status  02/21/2022 4.6 3.5 - 5.2 mmol/L Final  02/24/2013 4.1 3.5 - 5.1 mmol/L Final   Sodium  Date Value Ref Range Status  02/21/2022 139 134 - 144 mmol/L Final  02/24/2013 143 136 - 145 mmol/L Final   Bilirubin,Total  Date Value Ref Range Status  02/24/2013 0.4 0.2 - 1.0 mg/dL Final   Bilirubin Total  Date Value Ref Range Status  02/21/2022 0.6 0.0 - 1.2 mg/dL Final   Bilirubin, Direct  Date Value Ref Range Status  01/30/2022 0.4 (H) 0.0 - 0.2 mg/dL Final   Indirect Bilirubin  Date Value Ref Range Status  01/30/2022 0.6 0.3 - 0.9 mg/dL Final    Comment:    Performed at [facility], [address] [place], [place] [address]   Protein, ur  Date Value Ref Range Status  01/04/2018 NEGATIVE NEGATIVE mg/dL Final   Protein,UA  Date Value Ref Range Status  09/20/2021 Negative Negative/Trace Final   Total Protein  Date Value Ref Range Status  02/21/2022 7.3 6.0 - 8.5 g/dL Final  02/24/2013 7.2 6.4 - 8.2 g/dL Final   GFR, Est African American  Date Value Ref Range Status  01/01/2018 88 > OR = 60 mL/min/1.90m Final   GFR calc Af Amer  Date Value Ref Range Status  10/17/2020 79 >59 mL/min/1.73 Final    Comment:    **In accordance with recommendations from the NKF-ASN Task force,**   [facility] is in the process of updating its eGFR calculation to the   2021 CKD-EPI creatinine equation that estimates kidney  function   without a race variable.    eGFR  Date Value Ref Range Status  02/21/2022 94 >59 mL/min/1.73 Final   GFR, Est Non African American  Date Value Ref Range Status  01/01/2018 76 > OR = 60 mL/min/1.761mFinal   GFR, Estimated  Date Value Ref Range Status  02/16/2022 >60 >60 mL/min Final    Comment:    (NOTE) Calculated using the CKD-EPI Creatinine Equation (2021)

## 2022-04-11 NOTE — Telephone Encounter (Signed)
Medication Refill - Medication: QUEtiapine (SEROQUEL) 200 MG tablet  Has the patient contacted their pharmacy? No. Pt request that the Rx be sent to another pharmacy  Preferred Pharmacy (with phone number or street name):  [facility] ([facility] ) - [place], [place] Phone:  [phone]  Fax:  [phone]     Has the patient been seen for an appointment in the last year OR does the patient have an upcoming appointment? Yes.    Agent: Please be advised that RX refills may take up to 3 business days. We ask that you follow-up with your pharmacy.

## 2022-04-11 NOTE — Telephone Encounter (Signed)
Spoke with Kim Buchanan from [employer] who is now taking over [employer]. Kim Buchanan states they are missing the entire application that has her annual income information, they need the entire application to further continue. Kim Buchanan also stated the rx for Basalgar has incorrect max dose units, he stated the max dose does not match what was written in the rx by [name]. He states that the written rx was send with 40 units as max dose and it does not come close to what the signature of rx was written for. Kim Buchanan advises to fix this or else the pharmacy will not fill insulin pen. I could not find the Basalgar rx in historical medication list or active. Please advise.   Fax all corrected information to [phone].

## 2022-04-12 NOTE — Telephone Encounter (Signed)
Requested medication (s) are due for refill today - no  Requested medication (s) are on the active medication list -yes  Future visit scheduled -yes  Last refill: 04/03/22 #90 1RF  Notes to clinic: Multiple request for RF from mail order pharmacy. Attempted to contact patient- no answer. Original Rx sent to local pharmacy. Non delegated Rx  Requested Prescriptions  Pending Prescriptions Disp Refills   QUEtiapine (SEROQUEL) 200 MG tablet 90 tablet 1    Sig: Take 1 tablet (200 mg total) by mouth daily. Patient takes at bed time.     Not Delegated - Psychiatry:  Antipsychotics - Second Generation (Atypical) - quetiapine Failed - 04/11/2022  3:25 PM      Failed - This refill cannot be delegated      Failed - TSH in normal range and within 360 days    TSH  Date Value Ref Range Status  12/06/2021 5.816 (H) 0.350 - 4.500 uIU/mL Final    Comment:    Performed by a 3rd Generation assay with a functional sensitivity of <=0.01 uIU/mL. Performed at [facility], [address] [place], [place] [address]   11/24/2021 5.370 (H) 0.450 - 4.500 uIU/mL Final         Failed - Lipid Panel in normal range within the last 12 months    Cholesterol, Total  Date Value Ref Range Status  02/21/2022 69 (L) 100 - 199 mg/dL Final   LDL Cholesterol (Calc)  Date Value Ref Range Status  01/01/2018 29 mg/dL (calc) Final    Comment:    Reference range: <100 . Desirable range <100 mg/dL for primary prevention;   <70 mg/dL for patients with CHD or diabetic patients  with > or = 2 CHD risk factors. . LDL-C is now calculated using the Martin-Hopkins  calculation, which is a validated novel method providing  better accuracy than the Friedewald equation in the  estimation of LDL-C.  Martin SS et al. JAMA. 2013;310(19): 2061-2068  (http://education.[facility].com/faq/FAQ164)    LDL Chol Calc (NIH)  Date Value Ref Range Status  02/21/2022 11 0 - 99 mg/dL Final   HDL  Date Value Ref Range  Status  02/21/2022 29 (L) >39 mg/dL Final   Triglycerides  Date Value Ref Range Status  02/21/2022 183 (H) 0 - 149 mg/dL Final         Passed - Completed PHQ-2 or PHQ-9 in the last 360 days      Passed - Last BP in normal range    BP Readings from Last 1 Encounters:  03/20/22 126/64         Passed - Last Heart Rate in normal range    Pulse Readings from Last 1 Encounters:  03/20/22 70         Passed - Valid encounter within last 6 months    Recent Outpatient Visits           1 month ago Chronic systolic heart failure (HCC)   [facility] Holdsworth, Karen, NP   1 month ago Type 2 diabetes mellitus with peripheral neuropathy (HCC)   [facility] Holdsworth, Karen, NP   3 months ago Type 2 diabetes mellitus with peripheral neuropathy (HCC)   [facility] Holdsworth, Karen, NP   4 months ago NICM (nonischemic cardiomyopathy) (HCC)   [facility] Holdsworth, Karen, NP   6 months ago Yeast vaginitis   [facility] Holdsworth, Karen, NP       Future Appointments               In 1 month Holdsworth, Karen, NP [facility]   In 3 months  [facility]            Passed - CBC within normal limits and completed in the last 12 months    WBC  Date Value Ref Range Status  11/24/2021 6.1 3.4 - 10.8 x10E3/uL Final  05/22/2021 8.5 4.0 - 10.5 K/uL Final   RBC  Date Value Ref Range Status  11/24/2021 5.40 (H) 3.77 - 5.28 x10E6/uL Final  05/22/2021 5.72 (H) 3.87 - 5.11 MIL/uL Final   Hemoglobin  Date Value Ref Range Status  11/24/2021 15.2 11.1 - 15.9 g/dL Final   Hematocrit  Date Value Ref Range Status  11/24/2021 48.6 (H) 34.0 - 46.6 % Final   MCHC  Date Value Ref Range Status  11/24/2021 31.3 (L) 31.5 - 35.7 g/dL Final  05/22/2021 30.7 30.0 - 36.0 g/dL Final   MCH  Date Value Ref Range Status  11/24/2021 28.1 26.6 - 33.0 pg Final  05/22/2021 26.4 26.0 - 34.0 pg Final   MCV   Date Value Ref Range Status  11/24/2021 90 79 - 97 fL Final  02/24/2013 95 80 - 100 fL Final   No results found for: "PLTCOUNTKUC", "LABPLAT", "POCPLA" RDW  Date Value Ref Range Status  11/24/2021 14.4 11.7 - 15.4 % Final  02/24/2013 13.0 11.5 - 14.5 % Final         Passed - CMP within normal limits and completed in the last 12 months    Albumin  Date Value Ref Range Status  02/21/2022 4.7 3.8 - 4.8 g/dL Final  02/24/2013 3.6 3.4 - 5.0 g/dL Final   Alkaline Phosphatase  Date Value Ref Range Status  02/21/2022 175 (H) 44 - 121 IU/L Final  02/24/2013 113 50 - 136 Unit/L Final   Alkaline phosphatase (APISO)  Date Value Ref Range Status  01/01/2018 92 33 - 130 U/L Final   ALT  Date Value Ref Range Status  02/21/2022 106 (H) 0 - 32 IU/L Final   SGPT (ALT)  Date Value Ref Range Status  02/24/2013 94 (H) 12 - 78 U/L Final   AST  Date Value Ref Range Status  02/21/2022 92 (H) 0 - 40 IU/L Final   SGOT(AST)  Date Value Ref Range Status  02/24/2013 58 (H) 15 - 37 Unit/L Final   BUN  Date Value Ref Range Status  02/21/2022 15 8 - 27 mg/dL Final  02/24/2013 11 7 - 18 mg/dL Final   Calcium  Date Value Ref Range Status  02/21/2022 9.1 8.7 - 10.3 mg/dL Final   Calcium, Total  Date Value Ref Range Status  02/24/2013 8.6 8.5 - 10.1 mg/dL Final   CO2  Date Value Ref Range Status  02/21/2022 19 (L) 20 - 29 mmol/L Final   Co2  Date Value Ref Range Status  02/24/2013 26 21 - 32 mmol/L Final   Bicarbonate  Date Value Ref Range Status  01/06/2018 24.3 20.0 - 28.0 mmol/L Final   Creat  Date Value Ref Range Status  01/01/2018 0.82 0.50 - 0.99 mg/dL Final    Comment:    For patients >49 years of age, the reference limit for Creatinine is approximately 13% higher for people identified as African-American. .    Creatinine, Ser  Date Value Ref Range Status  02/21/2022 0.70 0.57 - 1.00 mg/dL Final   Glucose  Date Value Ref Range Status  02/21/2022 271 (H) 70 -  99 mg/dL   Final  02/24/2013 87 65 - 99 mg/dL Final   Glucose, Bld  Date Value Ref Range Status  02/16/2022 367 (H) 70 - 99 mg/dL Final    Comment:    Glucose reference range applies only to samples taken after fasting for at least 8 hours.   Glucose-Capillary  Date Value Ref Range Status  01/04/2022 327 (H) 70 - 99 mg/dL Final    Comment:    Glucose reference range applies only to samples taken after fasting for at least 8 hours.   Potassium  Date Value Ref Range Status  02/21/2022 4.6 3.5 - 5.2 mmol/L Final  02/24/2013 4.1 3.5 - 5.1 mmol/L Final   Sodium  Date Value Ref Range Status  02/21/2022 139 134 - 144 mmol/L Final  02/24/2013 143 136 - 145 mmol/L Final   Bilirubin,Total  Date Value Ref Range Status  02/24/2013 0.4 0.2 - 1.0 mg/dL Final   Bilirubin Total  Date Value Ref Range Status  02/21/2022 0.6 0.0 - 1.2 mg/dL Final   Bilirubin, Direct  Date Value Ref Range Status  01/30/2022 0.4 (H) 0.0 - 0.2 mg/dL Final   Indirect Bilirubin  Date Value Ref Range Status  01/30/2022 0.6 0.3 - 0.9 mg/dL Final    Comment:    Performed at [facility], [address] [place], [place] [address]   Protein, ur  Date Value Ref Range Status  01/04/2018 NEGATIVE NEGATIVE mg/dL Final   Protein,UA  Date Value Ref Range Status  09/20/2021 Negative Negative/Trace Final   Total Protein  Date Value Ref Range Status  02/21/2022 7.3 6.0 - 8.5 g/dL Final  02/24/2013 7.2 6.4 - 8.2 g/dL Final   GFR, Est African American  Date Value Ref Range Status  01/01/2018 88 > OR = 60 mL/min/1.73m2 Final   GFR calc Af Amer  Date Value Ref Range Status  10/17/2020 79 >59 mL/min/1.73 Final    Comment:    **In accordance with recommendations from the NKF-ASN Task force,**   [facility] is in the process of updating its eGFR calculation to the   2021 CKD-EPI creatinine equation that estimates kidney function   without a race variable.    eGFR  Date Value Ref Range Status   02/21/2022 94 >59 mL/min/1.73 Final   GFR, Est Non African American  Date Value Ref Range Status  01/01/2018 76 > OR = 60 mL/min/1.73m2 Final   GFR, Estimated  Date Value Ref Range Status  02/16/2022 >60 >60 mL/min Final    Comment:    (NOTE) Calculated using the CKD-EPI Creatinine Equation (2021)             Requested Prescriptions  Pending Prescriptions Disp Refills   QUEtiapine (SEROQUEL) 200 MG tablet 90 tablet 1    Sig: Take 1 tablet (200 mg total) by mouth daily. Patient takes at bed time.     Not Delegated - Psychiatry:  Antipsychotics - Second Generation (Atypical) - quetiapine Failed - 04/11/2022  3:25 PM      Failed - This refill cannot be delegated      Failed - TSH in normal range and within 360 days    TSH  Date Value Ref Range Status  12/06/2021 5.816 (H) 0.350 - 4.500 uIU/mL Final    Comment:    Performed by a 3rd Generation assay with a functional sensitivity of <=0.01 uIU/mL. Performed at [facility], [address] [place],  [address]   11/24/2021 5.370 (H) 0.450 - 4.500 uIU/mL Final           Failed - Lipid Panel in normal range within the last 12 months    Cholesterol, Total  Date Value Ref Range Status  02/21/2022 69 (L) 100 - 199 mg/dL Final   LDL Cholesterol (Calc)  Date Value Ref Range Status  01/01/2018 29 mg/dL (calc) Final    Comment:    Reference range: <100 . Desirable range <100 mg/dL for primary prevention;   <70 mg/dL for patients with CHD or diabetic patients  with > or = 2 CHD risk factors. . LDL-C is now calculated using the Martin-Hopkins  calculation, which is a validated novel method providing  better accuracy than the Friedewald equation in the  estimation of LDL-C.  Martin SS et al. JAMA. 2013;310(19): 2061-2068  (http://education.[facility].com/faq/FAQ164)    LDL Chol Calc (NIH)  Date Value Ref Range Status  02/21/2022 11 0 - 99 mg/dL Final   HDL  Date Value Ref Range Status  02/21/2022  29 (L) >39 mg/dL Final   Triglycerides  Date Value Ref Range Status  02/21/2022 183 (H) 0 - 149 mg/dL Final         Passed - Completed PHQ-2 or PHQ-9 in the last 360 days      Passed - Last BP in normal range    BP Readings from Last 1 Encounters:  03/20/22 126/64         Passed - Last Heart Rate in normal range    Pulse Readings from Last 1 Encounters:  03/20/22 70         Passed - Valid encounter within last 6 months    Recent Outpatient Visits           1 month ago Chronic systolic heart failure (HCC)   [facility] Holdsworth, Karen, NP   1 month ago Type 2 diabetes mellitus with peripheral neuropathy (HCC)   [facility] Holdsworth, Karen, NP   3 months ago Type 2 diabetes mellitus with peripheral neuropathy (HCC)   [facility] Holdsworth, Karen, NP   4 months ago NICM (nonischemic cardiomyopathy) (HCC)   [facility] Holdsworth, Karen, NP   6 months ago Yeast vaginitis   [facility] Holdsworth, Karen, NP       Future Appointments             In 1 month Holdsworth, Karen, NP [facility]   In 3 months  [facility]            Passed - CBC within normal limits and completed in the last 12 months    WBC  Date Value Ref Range Status  11/24/2021 6.1 3.4 - 10.8 x10E3/uL Final  05/22/2021 8.5 4.0 - 10.5 K/uL Final   RBC  Date Value Ref Range Status  11/24/2021 5.40 (H) 3.77 - 5.28 x10E6/uL Final  05/22/2021 5.72 (H) 3.87 - 5.11 MIL/uL Final   Hemoglobin  Date Value Ref Range Status  11/24/2021 15.2 11.1 - 15.9 g/dL Final   Hematocrit  Date Value Ref Range Status  11/24/2021 48.6 (H) 34.0 - 46.6 % Final   MCHC  Date Value Ref Range Status  11/24/2021 31.3 (L) 31.5 - 35.7 g/dL Final  05/22/2021 30.7 30.0 - 36.0 g/dL Final   MCH  Date Value Ref Range Status  11/24/2021 28.1 26.6 - 33.0 pg Final  05/22/2021 26.4 26.0 - 34.0 pg Final   MCV  Date Value Ref  Range Status  11/24/2021 90 79 - 97 fL Final  02/24/2013 95 80 -   100 fL Final   No results found for: "PLTCOUNTKUC", "LABPLAT", "POCPLA" RDW  Date Value Ref Range Status  11/24/2021 14.4 11.7 - 15.4 % Final  02/24/2013 13.0 11.5 - 14.5 % Final         Passed - CMP within normal limits and completed in the last 12 months    Albumin  Date Value Ref Range Status  02/21/2022 4.7 3.8 - 4.8 g/dL Final  02/24/2013 3.6 3.4 - 5.0 g/dL Final   Alkaline Phosphatase  Date Value Ref Range Status  02/21/2022 175 (H) 44 - 121 IU/L Final  02/24/2013 113 50 - 136 Unit/L Final   Alkaline phosphatase (APISO)  Date Value Ref Range Status  01/01/2018 92 33 - 130 U/L Final   ALT  Date Value Ref Range Status  02/21/2022 106 (H) 0 - 32 IU/L Final   SGPT (ALT)  Date Value Ref Range Status  02/24/2013 94 (H) 12 - 78 U/L Final   AST  Date Value Ref Range Status  02/21/2022 92 (H) 0 - 40 IU/L Final   SGOT(AST)  Date Value Ref Range Status  02/24/2013 58 (H) 15 - 37 Unit/L Final   BUN  Date Value Ref Range Status  02/21/2022 15 8 - 27 mg/dL Final  02/24/2013 11 7 - 18 mg/dL Final   Calcium  Date Value Ref Range Status  02/21/2022 9.1 8.7 - 10.3 mg/dL Final   Calcium, Total  Date Value Ref Range Status  02/24/2013 8.6 8.5 - 10.1 mg/dL Final   CO2  Date Value Ref Range Status  02/21/2022 19 (L) 20 - 29 mmol/L Final   Co2  Date Value Ref Range Status  02/24/2013 26 21 - 32 mmol/L Final   Bicarbonate  Date Value Ref Range Status  01/06/2018 24.3 20.0 - 28.0 mmol/L Final   Creat  Date Value Ref Range Status  01/01/2018 0.82 0.50 - 0.99 mg/dL Final    Comment:    For patients >49 years of age, the reference limit for Creatinine is approximately 13% higher for people identified as African-American. .    Creatinine, Ser  Date Value Ref Range Status  02/21/2022 0.70 0.57 - 1.00 mg/dL Final   Glucose  Date Value Ref Range Status  02/21/2022 271 (H) 70 - 99 mg/dL Final   02/24/2013 87 65 - 99 mg/dL Final   Glucose, Bld  Date Value Ref Range Status  02/16/2022 367 (H) 70 - 99 mg/dL Final    Comment:    Glucose reference range applies only to samples taken after fasting for at least 8 hours.   Glucose-Capillary  Date Value Ref Range Status  01/04/2022 327 (H) 70 - 99 mg/dL Final    Comment:    Glucose reference range applies only to samples taken after fasting for at least 8 hours.   Potassium  Date Value Ref Range Status  02/21/2022 4.6 3.5 - 5.2 mmol/L Final  02/24/2013 4.1 3.5 - 5.1 mmol/L Final   Sodium  Date Value Ref Range Status  02/21/2022 139 134 - 144 mmol/L Final  02/24/2013 143 136 - 145 mmol/L Final   Bilirubin,Total  Date Value Ref Range Status  02/24/2013 0.4 0.2 - 1.0 mg/dL Final   Bilirubin Total  Date Value Ref Range Status  02/21/2022 0.6 0.0 - 1.2 mg/dL Final   Bilirubin, Direct  Date Value Ref Range Status  01/30/2022 0.4 (H) 0.0 - 0.2 mg/dL Final   Indirect Bilirubin  Date Value Ref Range Status    01/30/2022 0.6 0.3 - 0.9 mg/dL Final    Comment:    Performed at [facility], [address] [place], [place] [address]   Protein, ur  Date Value Ref Range Status  01/04/2018 NEGATIVE NEGATIVE mg/dL Final   Protein,UA  Date Value Ref Range Status  09/20/2021 Negative Negative/Trace Final   Total Protein  Date Value Ref Range Status  02/21/2022 7.3 6.0 - 8.5 g/dL Final  02/24/2013 7.2 6.4 - 8.2 g/dL Final   GFR, Est African American  Date Value Ref Range Status  01/01/2018 88 > OR = 60 mL/min/1.22m Final   GFR calc Af Amer  Date Value Ref Range Status  10/17/2020 79 >59 mL/min/1.73 Final    Comment:    **In accordance with recommendations from the NKF-ASN Task force,**   [facility] is in the process of updating its eGFR calculation to the   2021 CKD-EPI creatinine equation that estimates kidney function   without a race variable.    eGFR  Date Value Ref Range Status  02/21/2022 94 >59  mL/min/1.73 Final   GFR, Est Non African American  Date Value Ref Range Status  01/01/2018 76 > OR = 60 mL/min/1.786mFinal   GFR, Estimated  Date Value Ref Range Status  02/16/2022 >60 >60 mL/min Final    Comment:    (NOTE) Calculated using the CKD-EPI Creatinine Equation (2021)

## 2022-04-13 ENCOUNTER — Ambulatory Visit: Payer: Self-pay

## 2022-04-13 ENCOUNTER — Other Ambulatory Visit: Payer: Self-pay | Admitting: Nurse Practitioner

## 2022-04-13 MED ORDER — QUETIAPINE FUMARATE 200 MG PO TABS: 200.0000 mg | ORAL_TABLET | Freq: Every day | ORAL | 1 refills | Status: DC

## 2022-04-13 NOTE — Telephone Encounter (Signed)
RX was sent to local pharmacy, requesting to have it sent to mail order.

## 2022-04-16 NOTE — Telephone Encounter (Addendum)
Please let Chrissie Noa know that this is an add on medication.  Patient is already receiving Trulicity through [facility] and that is where her full application is.  When this was added we were told she did not need the full application because she was already receiving Trulicity. Please have him fax over the form he would like completed with the max dose.  Please explain that we have filled this out 4 times and need to get this done as soon as possible.

## 2022-04-20 DIAGNOSIS — E1159 Type 2 diabetes mellitus with other circulatory complications: Secondary | ICD-10-CM | POA: Diagnosis not present

## 2022-04-20 DIAGNOSIS — Z794 Long term (current) use of insulin: Secondary | ICD-10-CM | POA: Diagnosis not present

## 2022-04-20 DIAGNOSIS — I501 Left ventricular failure: Secondary | ICD-10-CM | POA: Diagnosis not present

## 2022-04-26 ENCOUNTER — Telehealth: Payer: Self-pay

## 2022-04-26 NOTE — Telephone Encounter (Signed)
Called and spoke with Tamika with [facility] about patients Baslagar medication. I informed Ms. Tamika that we have sent over several faxes for patient medication and patient has not received yet. I had Ms Elwin Sleight check on all the faxes that they have received from [place] and she did find the correct prescriptions for the medication that was prescribed. She stated that they did not need anything further from [place] and that she would send an urgent request to have the patients medication sent out to the patient.

## 2022-04-26 NOTE — Telephone Encounter (Signed)
Received fax  from [facility] stating they are missing information such as patient signature, prescriber signature, and max dose clarification. Spoke with representative named Indu, I have asked to speak with a Librarian, academic. I was told there was misinformation with patients name. Awaiting call from Supervisor.

## 2022-04-26 NOTE — Telephone Encounter (Signed)
Spoke with patient to inform her of her Basalgar being sent urgently to her home if by chance it is sent to the practice we will call her for pick up. Pt voiced understanding,.

## 2022-05-02 NOTE — Telephone Encounter (Signed)
Extensive phone call with [name] along with provider having an extensive phone call with pharmacist who agrees with Kim Buchanan regarding rx for [employer]. Patient is to receive basalgar by the end of the week. Patient has been notified.

## 2022-05-02 NOTE — Telephone Encounter (Signed)
Medication Refill - Medication: Basalgar Patient wasn't received this medication as of yet  Has the patient contacted their pharmacy? yes (Agent: If no, request that the patient contact the pharmacy for the refill. If patient does not wish to contact the pharmacy document the reason why and proceed with request.) (Agent: If yes, when and what did the pharmacy advise?)contact pcp  Preferred Pharmacy (with phone number or street name):  She isn't sure of the mail order company Has the patient been seen for an appointment in the last year OR does the patient have an upcoming appointment? yes  Agent: Please be advised that RX refills may take up to 3 business days. We ask that you follow-up with your pharmacy.

## 2022-05-03 ENCOUNTER — Telehealth: Payer: Self-pay

## 2022-05-03 NOTE — Progress Notes (Signed)
BP (!) 93/57   Pulse 68   Temp 97.8 F (36.6 C) (Oral)   Wt 209 lb 6.4 oz (95 kg)   SpO2 96%   BMI 38.30 kg/m    Subjective:    Patient ID: Kim Buchanan, female    DOB: 10-17-1951, 71 y.o.   MRN: [id number]  HPI: Kim Buchanan is a 71 y.o. female  Chief Complaint  Patient presents with   Diabetes    Patient is here to be shown how to use her insulin pen    Patient here to be shown how to use Basaglar.  Sugars are running over 300.  She brought the pen with her to the visit.   Denies HA, CP, SOB, dizziness, palpitations, visual changes, and lower extremity swelling.  Relevant past medical, surgical, family and social history reviewed and updated as indicated. Interim medical history since our last visit reviewed. Allergies and medications reviewed and updated.  Review of Systems  Eyes:  Negative for visual disturbance.  Respiratory:  Negative for cough, chest tightness and shortness of breath.   Cardiovascular:  Negative for chest pain, palpitations and leg swelling.  Neurological:  Negative for dizziness and headaches.    Per HPI unless specifically indicated above     Objective:    BP (!) 93/57   Pulse 68   Temp 97.8 F (36.6 C) (Oral)   Wt 209 lb 6.4 oz (95 kg)   SpO2 96%   BMI 38.30 kg/m   Wt Readings from Last 3 Encounters:  05/04/22 209 lb 6.4 oz (95 kg)  03/20/22 218 lb 9.6 oz (99.2 kg)  02/21/22 217 lb 6.4 oz (98.6 kg)    Physical Exam Vitals and nursing note reviewed.  Constitutional:      General: She is not in acute distress.    Appearance: Normal appearance. She is normal weight. She is not ill-appearing, toxic-appearing or diaphoretic.  HENT:     Head: Normocephalic.     Right Ear: External ear normal.     Left Ear: External ear normal.     Nose: Nose normal.     Mouth/Throat:     Mouth: Mucous membranes are moist.     Pharynx: Oropharynx is clear.  Eyes:     General:        Right eye: No discharge.        Left eye: No discharge.      Extraocular Movements: Extraocular movements intact.     Conjunctiva/sclera: Conjunctivae normal.     Pupils: Pupils are equal, round, and reactive to light.  Cardiovascular:     Rate and Rhythm: Normal rate and regular rhythm.     Heart sounds: No murmur heard. Pulmonary:     Effort: Pulmonary effort is normal. No respiratory distress.     Breath sounds: Normal breath sounds. No wheezing or rales.  Musculoskeletal:     Cervical back: Normal range of motion and neck supple.  Skin:    General: Skin is warm and dry.     Capillary Refill: Capillary refill takes less than 2 seconds.  Neurological:     General: No focal deficit present.     Mental Status: She is alert and oriented to person, place, and time. Mental status is at baseline.  Psychiatric:        Mood and Affect: Mood normal.        Behavior: Behavior normal.        Thought Content: Thought content normal.  Judgment: Judgment normal.     Results for orders placed or performed during the hospital encounter of 03/22/22  Digoxin level  Result Value Ref Range   Digoxin Level 0.6 (L) 0.8 - 2.0 ng/mL      Assessment & Plan:   Problem List Items Addressed This Visit       Cardiovascular and Mediastinum   Hypertension associated with diabetes ([place])    Low at visit today.  Recommend checking at home over the weekend and calling the office on Monday with results.  Discussed signs and symptoms to monitor for and when to seek higher level of care.          Endocrine   Type 2 diabetes mellitus with peripheral neuropathy (HCC) - Primary    Chronic. Not well controlled.  Starting Basaglar 10u nightly and titrating up by 2 units every other day until sugars are less than 150.  Explained to patient how to use the pen and how to titrate medication.  Follow up in 1 month for A1c check. Call sooner if concerns arise.         Follow up plan: Return in about 1 month (around 06/04/2022) for HTN, HLD, DM2 FU.

## 2022-05-03 NOTE — Chronic Care Management (AMB) (Signed)
    Chronic Care Management Pharmacy Assistant   Name: Kim Buchanan  MRN: [id number] DOB: 01-28-1951   Reason for Encounter: Disease State Diabetes Mellitus   Recent office visits:  None noted  Recent consult visits:  None noted  Hospital visits:  None in previous 6 months  Medications: Outpatient Encounter Medications as of 05/03/2022  Medication Sig   amiodarone (PACERONE) 200 MG tablet Take 1 tablet (200 mg total) by mouth daily.   aspirin 81 MG tablet Take 81 mg by mouth daily.    atorvastatin (LIPITOR) 10 MG tablet Take 1 tablet (10 mg total) by mouth at bedtime.   blood glucose meter kit and supplies Dispense based on patient and insurance preference. Check fasting blood sugar once daily. (FOR ICD E11.69).   digoxin (LANOXIN) 0.125 MG tablet Take 1 tablet (0.125 mg total) by mouth daily.   Dulaglutide (TRULICITY) 1.5 FG/9.0SX SOPN Inject 1.5 mg into the skin once a week. (Patient taking differently: Inject 3 mg into the skin once a week.)   gabapentin (NEURONTIN) 300 MG capsule Take 1 capsule (300 mg total) by mouth 3 (three) times daily.   Lancets (ONETOUCH DELICA PLUS [id number]) MISC TEST ONCE DAILY   metFORMIN (GLUCOPHAGE) 1000 MG tablet TAKE 1 TABLET BY MOUTH TWICE  DAILY WITH MEALS   metoprolol succinate (TOPROL-XL) 50 MG 24 hr tablet Take 1 tablet (50 mg total) by mouth daily.   ONETOUCH ULTRA test strip TEST ONCE DAILY   pantoprazole (PROTONIX) 40 MG tablet TAKE 1 TABLET BY MOUTH  DAILY   polyethylene glycol powder (GLYCOLAX/MIRALAX) 17 GM/SCOOP powder Take 17 g by mouth daily.   potassium chloride (KLOR-CON) 10 MEQ tablet Take 1 tablet (10 mEq total) by mouth as directed. Take with your Torsemide.   QUEtiapine (SEROQUEL) 200 MG tablet Take 1 tablet (200 mg total) by mouth daily. Patient takes at bed time.   spironolactone (ALDACTONE) 25 MG tablet Take 1 tablet (25 mg total) by mouth daily.   torsemide (DEMADEX) 20 MG tablet Patient takes 1 tablet by mouth one day  then 2 tablets by mouth every other day.   valsartan (DIOVAN) 40 MG tablet Take 1 tablet (40 mg total) by mouth 2 (two) times daily.   No facility-administered encounter medications on file as of 05/03/2022.   Current antihyperglycemic regimen:  Trulicity 4.5 mg units Basaglar 10 units into the skin daily, increase by 2 units daily until fasting blood sugar less than 150   Unsuccessful attempts to complete assessment call. I have called patient 3x and left 3 voicemail's for the patient to return my call when available.   Adherence Review: Is the patient currently on a STATIN medication? Yes Is the patient currently on ACE/ARB medication? Yes Does the patient have >5 day gap between last estimated fill dates? Yes   Care Gaps: Zoster Vaccines:Never done FOOT EXAM:Last completed: Mar 08, 2021  Star Rating Drugs: Atorvastatin 10 mg Last MVVKPQ:24/49/75 90 DS Trulicity 1.5 mg Last PYYFRT:02/11/173 28 DS Metformin 1000 mg Last filled:03/02/22 90 DS Valsartan 40 mg Last filled:02/07/22 90 DS  [name], [place]

## 2022-05-04 ENCOUNTER — Encounter: Payer: Self-pay | Admitting: Nurse Practitioner

## 2022-05-04 ENCOUNTER — Ambulatory Visit (INDEPENDENT_AMBULATORY_CARE_PROVIDER_SITE_OTHER): Payer: Medicare Other | Admitting: Nurse Practitioner

## 2022-05-04 ENCOUNTER — Telehealth: Payer: Self-pay | Admitting: Nurse Practitioner

## 2022-05-04 VITALS — BP 93/57 | HR 68 | Temp 97.8°F | Wt 209.4 lb

## 2022-05-04 DIAGNOSIS — E1142 Type 2 diabetes mellitus with diabetic polyneuropathy: Secondary | ICD-10-CM

## 2022-05-04 DIAGNOSIS — E1159 Type 2 diabetes mellitus with other circulatory complications: Secondary | ICD-10-CM

## 2022-05-04 DIAGNOSIS — I152 Hypertension secondary to endocrine disorders: Secondary | ICD-10-CM

## 2022-05-04 MED ORDER — INSULIN PEN NEEDLE 30G X 6 MM MISC: 1.0000 | Freq: Every day | 2 refills | Status: DC

## 2022-05-04 NOTE — Assessment & Plan Note (Signed)
Low at visit today.  Recommend checking at home over the weekend and calling the office on Monday with results.  Discussed signs and symptoms to monitor for and when to seek higher level of care.

## 2022-05-04 NOTE — Telephone Encounter (Signed)
Spoke with patient regarding rx sent to pharmacy. No further questions.

## 2022-05-04 NOTE — Telephone Encounter (Signed)
Please let patient know that I sent her insulin pen needles to the pharmacy. Please let me know if there are any issues.

## 2022-05-04 NOTE — Assessment & Plan Note (Signed)
Chronic. Not well controlled.  Starting Basaglar 10u nightly and titrating up by 2 units every other day until sugars are less than 150.  Explained to patient how to use the pen and how to titrate medication.  Follow up in 1 month for A1c check. Call sooner if concerns arise.

## 2022-05-04 NOTE — Telephone Encounter (Addendum)
Patient had appointment with the provider this morning and wanted her to know she did not receive the needles for her insulin. She stated the provider would know what she is talking about. Please assist patient further. The patient uses [place], [place] [facility] [place] Phone:  [phone]  Fax:  [phone]

## 2022-05-07 ENCOUNTER — Ambulatory Visit (INDEPENDENT_AMBULATORY_CARE_PROVIDER_SITE_OTHER): Payer: Medicare Other | Admitting: Nurse Practitioner

## 2022-05-07 ENCOUNTER — Encounter: Payer: Self-pay | Admitting: Nurse Practitioner

## 2022-05-07 VITALS — BP 124/73 | HR 72 | Temp 98.2°F | Wt 208.6 lb

## 2022-05-07 DIAGNOSIS — E1142 Type 2 diabetes mellitus with diabetic polyneuropathy: Secondary | ICD-10-CM

## 2022-05-07 NOTE — Progress Notes (Unsigned)
There were no vitals taken for this visit.   Subjective:    Patient ID: Kim Buchanan, female    DOB: 1951-06-11, 71 y.o.   MRN: [id number]  HPI: Kim Buchanan is a 71 y.o. female  No chief complaint on file.  Patient here to be shown how to use Basaglar.  Sugars are running over 300.  She brought the pen with her to the visit.   Denies HA, CP, SOB, dizziness, palpitations, visual changes, and lower extremity swelling.  Relevant past medical, surgical, family and social history reviewed and updated as indicated. Interim medical history since our last visit reviewed. Allergies and medications reviewed and updated.  Review of Systems  Eyes:  Negative for visual disturbance.  Respiratory:  Negative for cough, chest tightness and shortness of breath.   Cardiovascular:  Negative for chest pain, palpitations and leg swelling.  Neurological:  Negative for dizziness and headaches.    Per HPI unless specifically indicated above     Objective:    There were no vitals taken for this visit.  Wt Readings from Last 3 Encounters:  05/04/22 209 lb 6.4 oz (95 kg)  03/20/22 218 lb 9.6 oz (99.2 kg)  02/21/22 217 lb 6.4 oz (98.6 kg)    Physical Exam Vitals and nursing note reviewed.  Constitutional:      General: She is not in acute distress.    Appearance: Normal appearance. She is normal weight. She is not ill-appearing, toxic-appearing or diaphoretic.  HENT:     Head: Normocephalic.     Right Ear: External ear normal.     Left Ear: External ear normal.     Nose: Nose normal.     Mouth/Throat:     Mouth: Mucous membranes are moist.     Pharynx: Oropharynx is clear.  Eyes:     General:        Right eye: No discharge.        Left eye: No discharge.     Extraocular Movements: Extraocular movements intact.     Conjunctiva/sclera: Conjunctivae normal.     Pupils: Pupils are equal, round, and reactive to light.  Cardiovascular:     Rate and Rhythm: Normal rate and regular  rhythm.     Heart sounds: No murmur heard. Pulmonary:     Effort: Pulmonary effort is normal. No respiratory distress.     Breath sounds: Normal breath sounds. No wheezing or rales.  Musculoskeletal:     Cervical back: Normal range of motion and neck supple.  Skin:    General: Skin is warm and dry.     Capillary Refill: Capillary refill takes less than 2 seconds.  Neurological:     General: No focal deficit present.     Mental Status: She is alert and oriented to person, place, and time. Mental status is at baseline.  Psychiatric:        Mood and Affect: Mood normal.        Behavior: Behavior normal.        Thought Content: Thought content normal.        Judgment: Judgment normal.    Results for orders placed or performed during the hospital encounter of 03/22/22  Digoxin level  Result Value Ref Range   Digoxin Level 0.6 (L) 0.8 - 2.0 ng/mL      Assessment & Plan:   Problem List Items Addressed This Visit   None    Follow up plan: No follow-ups on file.

## 2022-05-08 ENCOUNTER — Encounter: Payer: Self-pay | Admitting: Nurse Practitioner

## 2022-05-08 NOTE — Assessment & Plan Note (Signed)
Chronic. Patient was given first injection in office today.  Showed patient how to use insulin pen and what to monitor for.  Patient agrees and understands how to use pen. Follow up in 1 month for repeat A1c.

## 2022-05-16 ENCOUNTER — Other Ambulatory Visit (HOSPITAL_COMMUNITY): Payer: Self-pay

## 2022-05-16 MED ORDER — AMIODARONE HCL 200 MG PO TABS: 200.0000 mg | ORAL_TABLET | Freq: Every day | ORAL | 3 refills | Status: DC

## 2022-05-23 NOTE — Progress Notes (Deleted)
There were no vitals taken for this visit.   Subjective:    Patient ID: Kim Buchanan, female    DOB: 14-Aug-1951, 71 y.o.   MRN: [id number]  HPI: Kim Buchanan is a 71 y.o. female  No chief complaint on file.  HYPERTENSION / [place] Patient states she stopped taking her her toresemde, potassium and amiodarone.  She was not told to stop these by Cardiology. She stopped then on her own. Satisfied with current treatment? yes Duration of hypertension: years BP monitoring frequency: a few times a month BP range: 120/80 BP medication side effects: no Past BP meds:  metoprolol and valsartan Duration of hyperlipidemia: years Cholesterol medication side effects: no Cholesterol supplements: none Past cholesterol medications: atorvastain (lipitor) Medication compliance: excellent compliance Aspirin: yes Recent stressors: no Recurrent headaches: no Visual changes: no Palpitations: no Dyspnea: SOB Chest pain: no Lower extremity edema: no Dizzy/lightheaded: no  DIABETES Hypoglycemic episodes:no Polydipsia/polyuria: no Visual disturbance: no Chest pain: no Paresthesias: no Glucose Monitoring: yes  Accucheck frequency: daily  Fasting glucose: 200-300  Post prandial:  Evening:  Before meals: Taking Insulin?: yes  Long acting insulin:  Short acting insulin: Blood Pressure Monitoring: a few times a month Retinal Examination: Up to Date Foot Exam: Up to Date Diabetic Education: Not Completed Pneumovax: Up to Date Influenza: Up to Date Aspirin: yes Hasn't received the increased dose of Trulicity yet.  HEART FAILURE Patient states she is seeing the cardiologist in [place]  this month to see the PA. Will see MD in July. States her SOB has improved some. Only becomes SOB when she is walking.  She is having a hard time sleeping and would like to increase her seroquel to help her sleep.   COPD COPD status: controlled Satisfied with current treatment?:  yes Oxygen use: no Dyspnea frequency:  Cough frequency:  Rescue inhaler frequency:   Limitation of activity: yes Productive cough:  Last Spirometry:  Pneumovax: Up to Date Influenza: Up to Date   Relevant past medical, surgical, family and social history reviewed and updated as indicated. Interim medical history since our last visit reviewed. Allergies and medications reviewed and updated.  Review of Systems  Eyes:  Negative for visual disturbance.  Respiratory:  Positive for shortness of breath. Negative for cough and chest tightness.   Cardiovascular:  Negative for chest pain, palpitations and leg swelling.  Endocrine: Negative for polydipsia and polyuria.  Neurological:  Negative for dizziness, numbness and headaches.  Psychiatric/Behavioral:  Positive for sleep disturbance.    Per HPI unless specifically indicated above     Objective:    There were no vitals taken for this visit.  Wt Readings from Last 3 Encounters:  05/07/22 208 lb 9.6 oz (94.6 kg)  05/04/22 209 lb 6.4 oz (95 kg)  03/20/22 218 lb 9.6 oz (99.2 kg)    Physical Exam Vitals and nursing note reviewed.  Constitutional:      General: She is not in acute distress.    Appearance: Normal appearance. She is obese. She is not ill-appearing, toxic-appearing or diaphoretic.  HENT:     Head: Normocephalic.     Right Ear: External ear normal.     Left Ear: External ear normal.     Nose: Nose normal.     Mouth/Throat:     Mouth: Mucous membranes are moist.     Pharynx: Oropharynx is clear.  Eyes:     General:        Right eye: No discharge.  Left eye: No discharge.     Extraocular Movements: Extraocular movements intact.     Conjunctiva/sclera: Conjunctivae normal.     Pupils: Pupils are equal, round, and reactive to light.  Cardiovascular:     Rate and Rhythm: Normal rate and regular rhythm.     Heart sounds: No murmur heard. Pulmonary:     Effort: Pulmonary effort is normal. No respiratory  distress.     Breath sounds: Normal breath sounds. No wheezing or rales.  Musculoskeletal:     Cervical back: Normal range of motion and neck supple.     Right lower leg: No edema.     Left lower leg: No edema.  Skin:    General: Skin is warm and dry.     Capillary Refill: Capillary refill takes less than 2 seconds.  Neurological:     General: No focal deficit present.     Mental Status: She is alert and oriented to person, place, and time. Mental status is at baseline.  Psychiatric:        Mood and Affect: Mood normal.        Behavior: Behavior normal.        Thought Content: Thought content normal.        Judgment: Judgment normal.    Results for orders placed or performed during the hospital encounter of 03/22/22  Digoxin level  Result Value Ref Range   Digoxin Level 0.6 (L) 0.8 - 2.0 ng/mL      Assessment & Plan:   Problem List Items Addressed This Visit      Cardiovascular and Mediastinum   Chronic systolic heart failure (HCC) (Chronic)   Hypertension associated with diabetes (HCC)   NICM (nonischemic cardiomyopathy) ([place]) - Primary   CAD (coronary artery disease), native coronary artery   Aortic atherosclerosis (HCC)   HFrEF (heart failure with reduced ejection fraction) (HCC)     Respiratory   COPD (chronic obstructive pulmonary disease) ([place])     Endocrine   Hyperlipidemia associated with type 2 diabetes mellitus ([place])   Type 2 diabetes mellitus with peripheral neuropathy ([place])     Other   Morbid obesity (HCC)   Moderate episode of recurrent major depressive disorder ([place])     Follow up plan: No follow-ups on file.

## 2022-05-24 ENCOUNTER — Ambulatory Visit: Payer: Medicare Other | Admitting: Nurse Practitioner

## 2022-05-24 DIAGNOSIS — I5022 Chronic systolic (congestive) heart failure: Secondary | ICD-10-CM

## 2022-05-24 DIAGNOSIS — I700 Atherosclerosis of aorta: Secondary | ICD-10-CM

## 2022-05-24 DIAGNOSIS — E1169 Type 2 diabetes mellitus with other specified complication: Secondary | ICD-10-CM

## 2022-05-24 DIAGNOSIS — I25118 Atherosclerotic heart disease of native coronary artery with other forms of angina pectoris: Secondary | ICD-10-CM

## 2022-05-24 DIAGNOSIS — I152 Hypertension secondary to endocrine disorders: Secondary | ICD-10-CM

## 2022-05-24 DIAGNOSIS — F331 Major depressive disorder, recurrent, moderate: Secondary | ICD-10-CM

## 2022-05-24 DIAGNOSIS — J410 Simple chronic bronchitis: Secondary | ICD-10-CM

## 2022-05-24 DIAGNOSIS — I5020 Unspecified systolic (congestive) heart failure: Secondary | ICD-10-CM

## 2022-05-24 DIAGNOSIS — I428 Other cardiomyopathies: Secondary | ICD-10-CM

## 2022-05-24 DIAGNOSIS — E1142 Type 2 diabetes mellitus with diabetic polyneuropathy: Secondary | ICD-10-CM

## 2022-05-30 NOTE — Progress Notes (Deleted)
There were no vitals taken for this visit.   Subjective:    Patient ID: Kim Buchanan, female    DOB: Sep 03, 1951, 71 y.o.   MRN: [id number]  HPI: Kim Buchanan is a 71 y.o. female  No chief complaint on file.  HYPERTENSION / HYPERLIPIDEMIA Satisfied with current treatment? yes Duration of hypertension: years BP monitoring frequency: a few times a month BP range: 120/80 BP medication side effects: no Past BP meds:  metoprolol and valsartan Duration of hyperlipidemia: years Cholesterol medication side effects: no Cholesterol supplements: none Past cholesterol medications: atorvastain (lipitor) Medication compliance: excellent compliance Aspirin: yes Recent stressors: no Recurrent headaches: no Visual changes: no Palpitations: no Dyspnea: SOB Chest pain: no Lower extremity edema: no Dizzy/lightheaded: no  DIABETES Hypoglycemic episodes:no Polydipsia/polyuria: no Visual disturbance: no Chest pain: no Paresthesias: no Glucose Monitoring: no  Accucheck frequency: Not Checking  Fasting glucose:  Post prandial:  Evening:  Before meals: Taking Insulin?: yes  Long acting insulin:  Short acting insulin: Blood Pressure Monitoring: a few times a month Retinal Examination: Up to Date Foot Exam: Up to Date Diabetic Education: Not Completed Pneumovax: Up to Date Influenza: Up to Date Aspirin: yes  HEART FAILURE Patient states she is seeing the cardiologist in [place] tomorrow. This will be her second visit.  States her SOB has improved some. Only becomes SOB when she is walking.  She is having a hard time sleeping and would like to increase her seroquel to help her sleep.   Relevant past medical, surgical, family and social history reviewed and updated as indicated. Interim medical history since our last visit reviewed. Allergies and medications reviewed and updated.  Review of Systems  Eyes:  Negative for visual disturbance.  Respiratory:  Positive for  shortness of breath. Negative for cough and chest tightness.   Cardiovascular:  Negative for chest pain, palpitations and leg swelling.  Neurological:  Negative for dizziness and headaches.  Psychiatric/Behavioral:  Positive for sleep disturbance.    Per HPI unless specifically indicated above     Objective:    There were no vitals taken for this visit.  Wt Readings from Last 3 Encounters:  05/07/22 208 lb 9.6 oz (94.6 kg)  05/04/22 209 lb 6.4 oz (95 kg)  03/20/22 218 lb 9.6 oz (99.2 kg)    Physical Exam Vitals and nursing note reviewed.  Constitutional:      General: She is not in acute distress.    Appearance: Normal appearance. She is normal weight. She is not ill-appearing, toxic-appearing or diaphoretic.  HENT:     Head: Normocephalic.     Right Ear: External ear normal.     Left Ear: External ear normal.     Nose: Nose normal.     Mouth/Throat:     Mouth: Mucous membranes are moist.     Pharynx: Oropharynx is clear.  Eyes:     General:        Right eye: No discharge.        Left eye: No discharge.     Extraocular Movements: Extraocular movements intact.     Conjunctiva/sclera: Conjunctivae normal.     Pupils: Pupils are equal, round, and reactive to light.  Cardiovascular:     Rate and Rhythm: Normal rate and regular rhythm.     Heart sounds: No murmur heard. Pulmonary:     Effort: Pulmonary effort is normal. No respiratory distress.     Breath sounds: Normal breath sounds. No wheezing or rales.  Musculoskeletal:  Cervical back: Normal range of motion and neck supple.  Skin:    General: Skin is warm and dry.     Capillary Refill: Capillary refill takes less than 2 seconds.  Neurological:     General: No focal deficit present.     Mental Status: She is alert and oriented to person, place, and time. Mental status is at baseline.  Psychiatric:        Mood and Affect: Mood normal.        Behavior: Behavior normal.        Thought Content: Thought content  normal.        Judgment: Judgment normal.    Results for orders placed or performed during the hospital encounter of 03/22/22  Digoxin level  Result Value Ref Range   Digoxin Level 0.6 (L) 0.8 - 2.0 ng/mL      Assessment & Plan:   Problem List Items Addressed This Visit   None    Follow up plan: No follow-ups on file.

## 2022-05-31 ENCOUNTER — Ambulatory Visit: Payer: Medicare Other | Admitting: Nurse Practitioner

## 2022-05-31 ENCOUNTER — Encounter: Payer: Self-pay | Admitting: Nurse Practitioner

## 2022-05-31 ENCOUNTER — Ambulatory Visit (INDEPENDENT_AMBULATORY_CARE_PROVIDER_SITE_OTHER): Payer: Medicare Other | Admitting: Nurse Practitioner

## 2022-05-31 VITALS — BP 115/67 | HR 83 | Temp 98.0°F | Wt 206.5 lb

## 2022-05-31 DIAGNOSIS — I5022 Chronic systolic (congestive) heart failure: Secondary | ICD-10-CM | POA: Diagnosis not present

## 2022-05-31 DIAGNOSIS — I5020 Unspecified systolic (congestive) heart failure: Secondary | ICD-10-CM

## 2022-05-31 DIAGNOSIS — E1169 Type 2 diabetes mellitus with other specified complication: Secondary | ICD-10-CM

## 2022-05-31 DIAGNOSIS — E1159 Type 2 diabetes mellitus with other circulatory complications: Secondary | ICD-10-CM

## 2022-05-31 DIAGNOSIS — I25118 Atherosclerotic heart disease of native coronary artery with other forms of angina pectoris: Secondary | ICD-10-CM | POA: Diagnosis not present

## 2022-05-31 DIAGNOSIS — J410 Simple chronic bronchitis: Secondary | ICD-10-CM

## 2022-05-31 DIAGNOSIS — E785 Hyperlipidemia, unspecified: Secondary | ICD-10-CM

## 2022-05-31 DIAGNOSIS — I700 Atherosclerosis of aorta: Secondary | ICD-10-CM | POA: Diagnosis not present

## 2022-05-31 DIAGNOSIS — I428 Other cardiomyopathies: Secondary | ICD-10-CM | POA: Diagnosis not present

## 2022-05-31 DIAGNOSIS — I152 Hypertension secondary to endocrine disorders: Secondary | ICD-10-CM | POA: Diagnosis not present

## 2022-05-31 DIAGNOSIS — E1142 Type 2 diabetes mellitus with diabetic polyneuropathy: Secondary | ICD-10-CM

## 2022-05-31 DIAGNOSIS — F331 Major depressive disorder, recurrent, moderate: Secondary | ICD-10-CM

## 2022-05-31 NOTE — Assessment & Plan Note (Signed)
Chronic. Ongoing.  Patient restarted medications after seeing HF clinic. See[facility] in [place].  Has HF clinic appt next month.  Continues to follow up with Cardiology.  Continue per their recommendations.  Continue with ASA and Atorvastatin. - Reminded to call for an overnight weight gain of >2 pounds or a weekly weight gain of >5 pounds - not adding salt to food and read food labels. Reviewed the importance of keeping daily sodium intake to <2000mg daily. - Avoid Ibuprofen products.  

## 2022-05-31 NOTE — Assessment & Plan Note (Signed)
Chronic.  Controlled.  Continue with current medication regimen on Atorvastatin 10mg.  Labs ordered today.  Return to clinic in 3 months for reevaluation.  Call sooner if concerns arise.   

## 2022-05-31 NOTE — Assessment & Plan Note (Signed)
Chronic. Not well controlled.  Sugars running >300.  Currently taking 26u of long acting insulin.  Discussed continuing to increase dose until fasting sugars are <150.  Discussed letting me know when getting to 80u nightly.  Labs ordered today. Will make recommendations based on lab results.

## 2022-05-31 NOTE — Assessment & Plan Note (Signed)
Chronic.  Controlled.  Continue with current medication regimen.  Labs ordered today.  Return to clinic in 3 months for reevaluation.  Call sooner if concerns arise.   

## 2022-05-31 NOTE — Progress Notes (Signed)
BP 115/67   Pulse 83   Temp 98 F (36.7 C) (Oral)   Wt 206 lb 8 oz (93.7 kg)   SpO2 98%   BMI 37.77 kg/m    Subjective:    Patient ID: Kim Buchanan, female    DOB: 1951/03/18, 71 y.o.   MRN: [id number]  HPI: Kim Buchanan is a 71 y.o. female  Chief Complaint  Patient presents with   Hyperlipidemia   Hypertension   Diabetes    3 month follow up     HYPERTENSION / HYPERLIPIDEMIA Satisfied with current treatment? yes Duration of hypertension: years BP monitoring frequency: a few times a month BP range: 120/80 BP medication side effects: no Past BP meds:  metoprolol and valsartan Duration of hyperlipidemia: years Cholesterol medication side effects: no Cholesterol supplements: none Past cholesterol medications: atorvastain (lipitor) Medication compliance: excellent compliance Aspirin: yes Recent stressors: no Recurrent headaches: no Visual changes: no Palpitations: no Dyspnea: SOB Chest pain: no Lower extremity edema: no Dizzy/lightheaded: no  DIABETES Hypoglycemic episodes:no Polydipsia/polyuria: no Visual disturbance: no Chest pain: no Paresthesias: no Glucose Monitoring: yes  Accucheck frequency: in the morning  Fasting glucose: 300  Post prandial:  Evening:  Before meals: Taking Insulin?: yes  Long acting insulin: Has gotten up to 26u  Short acting insulin: Blood Pressure Monitoring: a few times a month Retinal Examination: Up to Date Foot Exam: Up to Date Diabetic Education: Not Completed Pneumovax: Up to Date Influenza: Up to Date Aspirin: yes Still increasing every other day due to sugars being elevated >300.  HEART FAILURE Patient states she is seeing the cardiologist in [place].  Will see HF in September.  States her SOB has improved some. Only becomes SOB when she is walking.  She is having a hard time sleeping and would like to increase her seroquel to help her sleep.   Relevant past medical, surgical, family and social  history reviewed and updated as indicated. Interim medical history since our last visit reviewed. Allergies and medications reviewed and updated.  Review of Systems  Eyes:  Negative for visual disturbance.  Respiratory:  Positive for shortness of breath. Negative for cough and chest tightness.   Cardiovascular:  Negative for chest pain, palpitations and leg swelling.  Neurological:  Negative for dizziness and headaches.  Psychiatric/Behavioral:  Positive for sleep disturbance.     Per HPI unless specifically indicated above     Objective:    BP 115/67   Pulse 83   Temp 98 F (36.7 C) (Oral)   Wt 206 lb 8 oz (93.7 kg)   SpO2 98%   BMI 37.77 kg/m   Wt Readings from Last 3 Encounters:  05/31/22 206 lb 8 oz (93.7 kg)  05/07/22 208 lb 9.6 oz (94.6 kg)  05/04/22 209 lb 6.4 oz (95 kg)    Physical Exam Vitals and nursing note reviewed.  Constitutional:      General: She is not in acute distress.    Appearance: Normal appearance. She is normal weight. She is not ill-appearing, toxic-appearing or diaphoretic.  HENT:     Head: Normocephalic.     Right Ear: External ear normal.     Left Ear: External ear normal.     Nose: Nose normal.     Mouth/Throat:     Mouth: Mucous membranes are moist.     Pharynx: Oropharynx is clear.  Eyes:     General:        Right eye: No discharge.  Left eye: No discharge.     Extraocular Movements: Extraocular movements intact.     Conjunctiva/sclera: Conjunctivae normal.     Pupils: Pupils are equal, round, and reactive to light.  Cardiovascular:     Rate and Rhythm: Normal rate and regular rhythm.     Heart sounds: No murmur heard. Pulmonary:     Effort: Pulmonary effort is normal. No respiratory distress.     Breath sounds: Normal breath sounds. No wheezing or rales.  Musculoskeletal:     Cervical back: Normal range of motion and neck supple.  Skin:    General: Skin is warm and dry.     Capillary Refill: Capillary refill takes less  than 2 seconds.  Neurological:     General: No focal deficit present.     Mental Status: She is alert and oriented to person, place, and time. Mental status is at baseline.  Psychiatric:        Mood and Affect: Mood normal.        Behavior: Behavior normal.        Thought Content: Thought content normal.        Judgment: Judgment normal.     Results for orders placed or performed during the hospital encounter of 03/22/22  Digoxin level  Result Value Ref Range   Digoxin Level 0.6 (L) 0.8 - 2.0 ng/mL      Assessment & Plan:   Problem List Items Addressed This Visit       Cardiovascular and Mediastinum   Chronic systolic heart failure (HCC) - Primary (Chronic)    Chronic. Ongoing.  Patient restarted medications after seeing HF clinic. See's [facility] in [place].  Has HF clinic appt next month.  Continues to follow up with Cardiology.  Continue per their recommendations.  Continue with ASA and Atorvastatin. - Reminded to call for an overnight weight gain of >2 pounds or a weekly weight gain of >5 pounds - not adding salt to food and read food labels. Reviewed the importance of keeping daily sodium intake to <2014m daily. - Avoid Ibuprofen products.       Relevant Orders   Comp Met (CMET)   Hy[place]ension associated with diabetes ([place]    Chronic.  Controlled.  Continue with current medication regimen.  Labs ordered today.  Return to clinic in 3 months for reevaluation.  Call sooner if concerns arise.        Relevant Orders   Comp Met (CMET)   NICM (nonischemic cardiomyopathy) (HCC)    Chronic. Ongoing.  Patient restarted medications after seeing HF clinic. [place] [facility] [place]  Has HF clinic appt next month.  Continues to follow up with Cardiology.  Continue per their recommendations.  Continue with ASA and Atorvastatin. - Reminded to call for an overnight weight gain of >2 pounds or a weekly weight gain of >5 pounds - not adding salt to food and read food  labels. Reviewed the importance of keeping daily sodium intake to <20041mdaily. - Avoid Ibuprofen products.       CAD (coronary artery disease), native coronary artery    Chronic. Ongoing.  Patient restarted medications after seeing HF clinic. Continues to follow up with Cardiology.  Continue per their recommendations.  Continue with ASA and Atorvastatin.      [place]ortic [place] ([place]   Observed [place] in 2021.  Continue with ASA and Atorvastatin.        HFrEF (heart failure with reduced ejection fraction) (HCC)    Chronic. Ongoing.  Patient restarted medications after seeing HF clinic. See's [facility] in [place].  Has HF clinic appt next month.  Continues to follow up with Cardiology.  Continue per their recommendations.  Continue with ASA and Atorvastatin. - Reminded to call for an overnight weight gain of >2 pounds or a weekly weight gain of >5 pounds - not adding salt to food and read food labels. Reviewed the importance of keeping daily sodium intake to <2066m daily. - Avoid Ibuprofen products.         Respiratory   COPD (chronic obstructive pulmonary disease) (HCC)    Chronic.  Controlled.  Continue with current medication regimen.  Labs ordered today.  Return to clinic in 3 months for reevaluation.  Call sooner if concerns arise.          Endocrine   Hyperlipidemia associated with type 2 diabetes mellitus (HCC)    Chronic.  Controlled.  Continue with current medication regimen on Atorvastatin 178m  Labs ordered today.  Return to clinic in 3 months for reevaluation.  Call sooner if concerns arise.        Relevant Orders   Lipid Profile   Type 2 diabetes mellitus with peripheral neuropathy (HCC)    Chronic. Not well controlled.  Sugars running >300.  Currently taking 26u of long acting insulin.  Discussed continuing to increase dose until fasting sugars are <150.  Discussed letting me know when getting to 80u nightly.  Labs ordered today. Will make  recommendations based on lab results.      Relevant Orders   HgB A1c     Other   Moderate episode of recurrent major depressive disorder (HCC)    Chronic.  Controlled.  Continue with current medication regimen.  Labs ordered today.  Return to clinic in 3 months for reevaluation.  Call sooner if concerns arise.          Follow up plan: Return in about 3 months (around 08/31/2022) for HTN, HLD, DM2 FU.

## 2022-05-31 NOTE — Assessment & Plan Note (Signed)
Chronic. Ongoing.  Patient restarted medications after seeing HF clinic. Continues to follow up with Cardiology.  Continue per their recommendations.  Continue with ASA and Atorvastatin.

## 2022-05-31 NOTE — Assessment & Plan Note (Signed)
Chronic. Ongoing.  Patient restarted medications after seeing HF clinic. See's [facility] in [place].  Has HF clinic appt next month.  Continues to follow up with Cardiology.  Continue per their recommendations.  Continue with ASA and Atorvastatin. - Reminded to call for an overnight weight gain of >2 pounds or a weekly weight gain of >5 pounds - not adding salt to food and read food labels. Reviewed the importance of keeping daily sodium intake to <2000mg daily. - Avoid Ibuprofen products.  

## 2022-05-31 NOTE — Assessment & Plan Note (Signed)
Observed on CT in 2021.  Continue with ASA and Atorvastatin.   

## 2022-05-31 NOTE — Assessment & Plan Note (Signed)
Chronic. Ongoing.  Patient restarted medications after seeing HF clinic. See's [facility] in [place].  Has HF clinic appt next month.  Continues to follow up with Cardiology.  Continue per their recommendations.  Continue with ASA and Atorvastatin. - Reminded to call for an overnight weight gain of >2 pounds or a weekly weight gain of >5 pounds - not adding salt to food and read food labels. Reviewed the importance of keeping daily sodium intake to <2000mg daily. - Avoid Ibuprofen products.  

## 2022-06-01 ENCOUNTER — Telehealth: Payer: Self-pay | Admitting: Nurse Practitioner

## 2022-06-01 LAB — COMPREHENSIVE METABOLIC PANEL
ALT: 46 IU/L — ABNORMAL HIGH (ref 0–32)
AST: 33 IU/L (ref 0–40)
Albumin/Globulin Ratio: 2.1 (ref 1.2–2.2)
Albumin: 4.9 g/dL (ref 3.9–4.9)
Alkaline Phosphatase: 68 IU/L (ref 44–121)
BUN/Creatinine Ratio: 20 (ref 12–28)
BUN: 18 mg/dL (ref 8–27)
Bilirubin Total: 0.4 mg/dL (ref 0.0–1.2)
CO2: 20 mmol/L (ref 20–29)
Calcium: 9.8 mg/dL (ref 8.7–10.3)
Chloride: 98 mmol/L (ref 96–106)
Creatinine, Ser: 0.92 mg/dL (ref 0.57–1.00)
Globulin, Total: 2.3 g/dL (ref 1.5–4.5)
Glucose: 92 mg/dL (ref 70–99)
Potassium: 4.3 mmol/L (ref 3.5–5.2)
Sodium: 142 mmol/L (ref 134–144)
Total Protein: 7.2 g/dL (ref 6.0–8.5)
eGFR: 67 mL/min/{1.73_m2} (ref >59)

## 2022-06-01 LAB — LIPID PANEL
Chol/HDL Ratio: 4.5 ratio — ABNORMAL HIGH (ref 0.0–4.4)
Cholesterol, Total: 150 mg/dL (ref 100–199)
HDL: 33 mg/dL — ABNORMAL LOW (ref >39)
LDL Chol Calc (NIH): 74 mg/dL (ref 0–99)
Triglycerides: 261 mg/dL — ABNORMAL HIGH (ref 0–149)
VLDL Cholesterol Cal: 43 mg/dL — ABNORMAL HIGH (ref 5–40)

## 2022-06-01 LAB — HEMOGLOBIN A1C
Est. average glucose Bld gHb Est-mCnc: 349 mg/dL
Hgb A1c MFr Bld: 13.8 % — ABNORMAL HIGH (ref 4.8–5.6)

## 2022-06-01 NOTE — Telephone Encounter (Signed)
Patient requested letter for [employer].   Please call patient and let her know the letter has been written for jury duty.

## 2022-06-01 NOTE — Progress Notes (Signed)
Please let patient know that her A1c increased to 13.8.  We need to continue to increase her insulin as discussed during the visit.  Other lab work looks good.  No other concerns at this time.

## 2022-06-01 NOTE — Telephone Encounter (Signed)
See result notes. 

## 2022-06-04 ENCOUNTER — Ambulatory Visit (INDEPENDENT_AMBULATORY_CARE_PROVIDER_SITE_OTHER): Payer: Medicare Other

## 2022-06-04 DIAGNOSIS — E1159 Type 2 diabetes mellitus with other circulatory complications: Secondary | ICD-10-CM

## 2022-06-04 DIAGNOSIS — I5022 Chronic systolic (congestive) heart failure: Secondary | ICD-10-CM

## 2022-06-04 DIAGNOSIS — I5020 Unspecified systolic (congestive) heart failure: Secondary | ICD-10-CM

## 2022-06-04 NOTE — Progress Notes (Signed)
Chronic Care Management Pharmacy Note  06/04/2022 Name:  Kim Buchanan MRN:  [id number] DOB:  1951/09/18  Summary: Pleasant 71 year old female presents for f/u CCM visit. She loves to dance but doesn't go out that much anymore. Self describes as a "Longer" and is very frustrated regarding her sugars  Recommendations to PCP: (Routed msg already to PCP regarding starting short acting insulin PAP paperwork in case she needs it) Concierge F/u 2 weeks  Subjective: Kim Buchanan is an 71 y.o. year old female who is a primary patient of [name], NP.  The CCM team was consulted for assistance with disease management and care coordination needs.    Engaged with patient by telephone for follow up visit in response to provider referral for pharmacy case management and/or care coordination services.   Consent to Services:  The patient was given information about Chronic Care Management services, agreed to services, and gave verbal consent prior to initiation of services.  Please see initial visit note for detailed documentation.   Patient Care Team: [name], NP as PCP - General (Nurse Practitioner) [name], MD as PCP - Cardiology (Cardiology) [name], MD as PCP - Electrophysiology (Cardiology) [name], OD as Consulting Physician (Optometry) [name], MD as Consulting Physician (Pulmonary Disease) Lane Hacker, [facility] (Pharmacist)  Recent office visits:  None noted   Recent consult visits:  None noted   Hospital visits:  None in previous 6 months  Objective:  Lab Results  Component Value Date   CREATININE 0.92 05/31/2022   CREATININE 0.70 02/21/2022   CREATININE 0.71 02/16/2022    Lab Results  Component Value Date   HGBA1C 13.8 (H) 05/31/2022   HGBA1C 11.9 (H) 02/21/2022   HGBA1C 11.7 (H) 11/24/2021   Last diabetic Eye exam:  Lab Results  Component Value Date/Time   HMDIABEYEEXA No Retinopathy 02/08/2022  12:00 AM    Last diabetic Foot exam: No results found for: "HMDIABFOOTEX"      Component Value Date/Time   CHOL 150 05/31/2022 1002   CHOL 69 (L) 02/21/2022 0820   CHOL 84 (L) 11/24/2021 0920   TRIG 261 (H) 05/31/2022 1002   TRIG 183 (H) 02/21/2022 0820   TRIG 243 (H) 11/24/2021 0920   HDL 33 (L) 05/31/2022 1002   HDL 29 (L) 02/21/2022 0820   HDL 29 (L) 11/24/2021 0920   CHOLHDL 4.5 (H) 05/31/2022 1002   CHOLHDL 1.7 01/01/2018 0000   VLDL 29 08/06/2016 0824   LDLCALC 74 05/31/2022 1002   LDLCALC 11 02/21/2022 0820   LDLCALC 18 11/24/2021 0920   LDLCALC 29 01/01/2018 0000       Latest Ref Rng & Units 05/31/2022   10:02 AM 02/21/2022    8:20 AM 01/30/2022   11:10 AM  Hepatic Function  Total Protein 6.0 - 8.5 g/dL 7.2  7.3  8.4   Albumin 3.9 - 4.9 g/dL 4.9  4.7  4.2   AST 0 - 40 IU/L 33  92  108   ALT 0 - 32 IU/L 46  106  116   Alk Phosphatase 44 - 121 IU/L 68  175  155   Total Bilirubin 0.0 - 1.2 mg/dL 0.4  0.6  1.0   Bilirubin, Direct 0.0 - 0.2 mg/dL   0.4     Lab Results  Component Value Date/Time   TSH 5.816 (H) 12/06/2021 10:41 AM   TSH 5.370 (H) 11/24/2021 09:20 AM   TSH 4.100 07/19/2021  01:52 PM   FREET4 0.81 12/06/2021 10:42 AM   FREET4 1.31 07/19/2021 01:52 PM       Latest Ref Rng & Units 11/24/2021    9:20 AM 06/13/2021    8:36 AM 05/22/2021    3:18 PM  CBC  WBC 3.4 - 10.8 x10E3/uL 6.1  6.9  8.5   Hemoglobin 11.1 - 15.9 g/dL 15.2  14.3  15.1   Hematocrit 34.0 - 46.6 % 48.6  44.8  49.2   Platelets 150 - 450 x10E3/uL 299  225  353     No results found for: "VD25OH"  Clinical ASCVD:  The 10-year ASCVD risk score (Arnett DK, et al., 2019) is: 27.7%   Values used to calculate the score:     Age: 48 years     Sex: Female     Is Non-Hispanic African American: No     Diabetic: Yes     Tobacco smoker: Yes     Systolic Blood Pressure: 416 mmHg     Is BP treated: Yes     HDL Cholesterol: 33 mg/dL     Total Cholesterol: 150 mg/dL   Social History    Tobacco Use  Smoking Status Former   Packs/day: 1.50   Years: 50.00   Total pack years: 75.00   Types: Cigarettes   Quit date: 11/29/2018   Years since quitting: 3.5  Smokeless Tobacco Never  Tobacco Comments   started at age 37   BP Readings from Last 3 Encounters:  05/31/22 115/67  05/07/22 124/73  05/04/22 (!) 93/57   Pulse Readings from Last 3 Encounters:  05/31/22 83  05/07/22 72  05/04/22 68   Wt Readings from Last 3 Encounters:  05/31/22 206 lb 8 oz (93.7 kg)  05/07/22 208 lb 9.6 oz (94.6 kg)  05/04/22 209 lb 6.4 oz (95 kg)   BMI Readings from Last 3 Encounters:  05/31/22 37.77 kg/m  05/07/22 38.15 kg/m  05/04/22 38.30 kg/m    Assessment: Review of patient past medical history, allergies, medications, health status, including review of consultants reports, laboratory and other test data, was performed as part of comprehensive evaluation and provision of chronic care management services.   SDOH:  (Social Determinants of Health) assessments and interventions performed: Yes SDOH Interventions    Flowsheet Row Most Recent Value  SDOH Interventions   Financial Strain Interventions Other (Comment)  [PAP]  Transportation Interventions Intervention Not Indicated       SDOH Interventions    Flowsheet Row Most Recent Value  SDOH Interventions   Financial Strain Interventions Other (Comment)  [PAP]  Transportation Interventions Intervention Not Indicated       CCM Care Plan  Allergies  Allergen Reactions   Entresto [Sacubitril-Valsartan]     Cough    Lisinopril Cough    Medications Reviewed Today     Reviewed by Lane Hacker, [facility] (Pharmacist) on 06/04/22 at 1413  Med List Status: <None>   Medication Order Taking? Sig Documenting Provider Last Dose Status Informant  amiodarone (PACERONE) 200 MG tablet [id number]  Take 1 tablet (200 mg total) by mouth daily. Allena Katz [place], [place]  Active   aspirin 81 MG tablet [id number]  Take 81 mg by mouth  daily.  [provider]  Active Self           Med Note Josiah Lobo, [facility]   Fri Jul 19, 2017 12:33 PM)    atorvastatin (LIPITOR) 10 MG tablet [id number]  Take 1 tablet (10 mg total) by  mouth at bedtime. Larey Dresser, MD  Active   digoxin (LANOXIN) 0.125 MG tablet [id number]  Take 1 tablet (0.125 mg total) by mouth daily. Larey Dresser, MD  Active   Dulaglutide (TRULICITY) 1.5 JS/9.7WY [facility] [id number] Yes Inject 1.5 mg into the skin once a week.  Patient taking differently: Inject 4.5 mg into the skin once a week.   [name], NP Taking Active   gabapentin (NEURONTIN) 300 MG capsule [id number]  Take 1 capsule (300 mg total) by mouth 3 (three) times daily. [name], NP  Active Self  Insulin Glargine [facility] KWIKPEN) 100 UNIT/ML [id number] Yes Inject 30 Units into the skin daily. ATLANTUS PROTOCOL  MDD: 45 units [provider]  Active   Insulin Pen Needle 30G X 6 MM MISC [id number]  1 Application by Does not apply route daily. [name], NP  Active   Lancets (ONETOUCH DELICA PLUS [id number]) [place] [id number]  TEST ONCE DAILY [name], NP  Active   metFORMIN (GLUCOPHAGE) 1000 MG tablet [id number]  TAKE 1 TABLET BY MOUTH TWICE  DAILY WITH MEALS [name], NP  Active   metoprolol succinate (TOPROL-XL) 50 MG 24 hr tablet [id number]  Take 1 tablet (50 mg total) by mouth daily. Larey Dresser, MD  Active   [facility] ULTRA test strip [id number]  TEST ONCE DAILY [name], NP  Active   pantoprazole (PROTONIX) 40 MG tablet [id number]  TAKE 1 TABLET BY MOUTH  DAILY [name], NP  Active   polyethylene glycol powder (GLYCOLAX/MIRALAX) 17 GM/SCOOP powder [id number]  Take 17 g by mouth daily. [provider]  Active Self  potassium chloride (KLOR-CON) 10 MEQ tablet [id number]  Take 1 tablet (10 mEq total) by mouth as directed. Take with your Torsemide. Rise Mu, PA-C  Expired 05/22/22 2359 Self  QUEtiapine (SEROQUEL) 200 MG  tablet [id number]  Take 1 tablet (200 mg total) by mouth daily. Patient takes at bed time. Marnee Guarneri T, NP  Active   spironolactone (ALDACTONE) 25 MG tablet [id number]  Take 1 tablet (25 mg total) by mouth daily. Larey Dresser, MD  Active   torsemide [facility]) 20 MG tablet [id number]  Patient takes 1 tablet by mouth one day then 2 tablets by mouth every other day. [provider]  Active   valsartan (DIOVAN) 40 MG tablet [id number]  Take 1 tablet (40 mg total) by mouth 2 (two) times daily. Larey Dresser, MD  Active             Patient Active Problem List   Diagnosis Date Noted   Chronic systolic heart failure ([place]) 01/17/2022   History of colonic polyps    Polyp of sigmoid colon    Moderate episode of recurrent major depressive disorder ([place]) 11/24/2021   OSA (obstructive sleep apnea) 09/18/2020   Aortic atherosclerosis ([place]) 06/10/2020   Hepatic steatosis 06/10/2020   HFrEF (heart failure with reduced ejection fraction) ([place]) 06/10/2020   Osteoarthritis of left hip 03/16/2020   Type 2 diabetes mellitus with peripheral neuropathy ([place]) 12/11/2019   NICM (nonischemic cardiomyopathy) ([place]) 09/11/2019   CAD (coronary artery disease), native coronary artery 09/11/2019   Hypertension associated with diabetes ([place]) 06/05/2019   Morbid obesity ([place]) 10/03/2018   Anemia 10/03/2018   COPD (chronic obstructive pulmonary disease) ([place]) 03/13/2018   Insomnia 11/06/2017   Hyperlipidemia associated with type 2 diabetes mellitus ([place]) 07/19/2017   S/P vaginal hysterectomy 12/17/2016   Vaginal atrophy 10/31/2016   Hx of colonic polyps  DDD (degenerative disc disease), lumbar 02/21/2016   Low back pain 02/14/2016   GAD (generalized anxiety disorder) 06/06/2015   MDD (major depressive disorder) 06/06/2015   Gastroesophageal reflux disease 06/06/2015    Immunization History  Administered Date(s) Administered   Fluad Quad(high Dose 65+) 07/26/2021   Hepatitis A, Adult  02/05/2022, 03/08/2022   Influenza, High Dose Seasonal PF 06/16/2019, 07/04/2020   Influenza, Seasonal, Injecte, Preservative Fre 08/18/2008, 07/14/2014   Influenza,inj,Quad PF,6+ Mos 09/25/2013, 08/05/2015, 08/02/2016, 07/24/2017   Moderna Sars-Covid-2 Vaccination 12/02/2019, 12/30/2019, 10/29/2020   Pneumococcal Conjugate-13 03/13/2018   Pneumococcal Polysaccharide-23 08/06/2019   Tdap 08/08/2009, 05/13/2014    Conditions to be addressed/monitored: HLD DM HFrEF HTN  Care Plan : [place]  Updates made by Lane Hacker, [place] since 06/04/2022 12:00 AM     Problem: hld copd htn dm2 copd hfref      Long-Range Goal: disease management   Start Date: 03/26/2022  Recent Progress: On track  Note:   Current Barriers:  Unable to independently afford treatment regimen Unable to achieve control of DM   Pharmacist Clinical Goal(s):  Patient will verbalize ability to afford treatment regimen achieve adherence to monitoring guidelines and medication adherence to achieve therapeutic efficacy through collaboration with PharmD and provider.   Interventions: 1:1 collaboration with [name], NP regarding development and update of comprehensive plan of care as evidenced by provider attestation and co-signature Inter-disciplinary care team collaboration (see longitudinal plan of care) Comprehensive medication review performed; medication list updated in electronic medical record  Diabetes (A1c goal <8%) -Uncontrolled -Current medications: Trulicity 4.5 mg units (PAP 2023) Appropriate, query effective Basaglar 30 units into the skin daily, increase by 2 units daily until fasting blood sugar less than 150  (PAP 2023) Appropriate, query effective -Medications previously tried: farxiga - yeast infxn,   -Current home glucose readings fasting glucose: August 2023: Only tests weekly but said was in 300's this morning -Denies hypoglycemic/hyperglycemic symptoms -Current meal  patterns: salads, bananas, eggs, vegetables. Coffee, tea.  -Current exercise: limited -Educated on A1c and blood sugar goals; -Counseled to check feet daily and get yearly eye exams -Counseled on diet and exercise extensively August 2023: Patient doing 30 units currently. With weight of 90kg, MDD is 45 units long acting. This should happen in about 2 weeks. Will have concierge call to check in on patient. Asked PCP if we could start paperwork for Short acting insulin because patient can't afford and will most likely require it -Counseled on testing daily -Will have CCM team f/u every 2 weeks until sugars at goal  Heart Failure (Goal: manage symptoms and prevent exacerbations) -Controlled -Last ejection fraction: 35-40% (Date: 03/20/21) -HF type: Left Ventricular Failure -NYHA Class: II (slight limitation of activity) -AHA HF Stage: C (Heart disease and symptoms present) -Current treatment: Metoprolol Appropriate, effective, save, accessible Torsemide Appropriate, effective, save, accessible Spironolactone Appropriate, effective, save, accessible Digoxin  Appropriate, effective, save, accessible Amiodarone  Appropriate, effective, save, accessible -Medications previously tried: [place] - yeast infxn, entresto - cough  Counseled on appropriate bp monitoring at home  -Current dietary habits:  -Current exercise habits:  -Educated on Benefits of medications for managing symptoms and prolonging life Proper diuretic administration and potassium supplementation Importance of blood pressure control -Counseled on diet and exercise extensively Recommended to continue current medication  Patient Goals/Self-Care Activities Patient will:  - take medications as prescribed as evidenced by patient report and record review collaborate with provider on medication access solutions  Medication Assistance: Trulicity +  basaglar (pending) obtained through [employer] medication assistance program.   Enrollment ends 09/2022  CPP F/U December  [place] Constable, [place].D. - [phone]       Patient's preferred pharmacy is:  [facility], [place] [address] Phone: [phone] Fax: [phone]  [facility] ([place], [place] [facility] [address] [place] [address] [place] [address] Phone: [phone] Fax: [phone]  [facility] [place], [place] - [address] [address] Phone: [phone] Fax: [phone]  [facility] ([facility]) - [place], [place] [address] Phone: [phone] Fax: [phone]   Pt endorses 100% compliance  Follow Up:  Patient agrees to Care Plan and Follow-up. Plan: CPP F/u December  Future Appointments  Date Time Provider [place]  06/22/2022 10:40 AM Larey Dresser, MD [facility] None  08/09/2022  9:00 AM AGI [place] NURSE [facility] None  08/31/2022 10:20 AM [name], NP [facility]FP[facility] PEC  09/21/2022  8:15 AM Edrick Kins, DPM [facility]  10/01/2022  2:00 PM CFP CCM PHARMACY CFP-[facility] PEC    [place] Constable, Pharm.D. - [phone]

## 2022-06-04 NOTE — Patient Instructions (Signed)
Visit Information   Goals Addressed   None    Patient Care Plan: CCM Pharmacy Care Plan     Problem Identified: hld copd htn dm2 copd hfref      Long-Range Goal: disease management   Start Date: 03/26/2022  Recent Progress: On track  Note:   Current Barriers:  Unable to independently afford treatment regimen Unable to achieve control of DM   Pharmacist Clinical Goal(s):  Patient will verbalize ability to afford treatment regimen achieve adherence to monitoring guidelines and medication adherence to achieve therapeutic efficacy through collaboration with PharmD and provider.   Interventions: 1:1 collaboration with [name], NP regarding development and update of comprehensive plan of care as evidenced by provider attestation and co-signature Inter-disciplinary care team collaboration (see longitudinal plan of care) Comprehensive medication review performed; medication list updated in electronic medical record  Diabetes (A1c goal <8%) -Uncontrolled -Current medications: Trulicity 4.5 mg units (PAP 2023) Appropriate, query effective Basaglar 30 units into the skin daily, increase by 2 units daily until fasting blood sugar less than 150  (PAP 2023) Appropriate, query effective -Medications previously tried: farxiga - yeast infxn,   -Current home glucose readings fasting glucose: August 2023: Only tests weekly but said was in 300's this morning -Denies hypoglycemic/hyperglycemic symptoms -Current meal patterns: salads, bananas, eggs, vegetables. Coffee, tea.  -Current exercise: limited -Educated on A1c and blood sugar goals; -Counseled to check feet daily and get yearly eye exams -Counseled on diet and exercise extensively August 2023: Patient doing 30 units currently. With weight of 90kg, MDD is 45 units long acting. This should happen in about 2 weeks. Will have concierge call to check in on patient. Asked PCP if we could start paperwork for Short acting insulin  because patient can't afford and will most likely require it -Counseled on testing daily -Will have CCM team f/u every 2 weeks until sugars at goal  Heart Failure (Goal: manage symptoms and prevent exacerbations) -Controlled -Last ejection fraction: 35-40% (Date: 03/20/21) -HF type: Left Ventricular Failure -NYHA Class: II (slight limitation of activity) -AHA HF Stage: C (Heart disease and symptoms present) -Current treatment: Metoprolol Appropriate, effective, save, accessible Torsemide Appropriate, effective, save, accessible Spironolactone Appropriate, effective, save, accessible Digoxin  Appropriate, effective, save, accessible Amiodarone  Appropriate, effective, save, accessible -Medications previously tried: [place] - yeast infxn, entresto - cough  Counseled on appropriate bp monitoring at home  -Current dietary habits:  -Current exercise habits:  -Educated on Benefits of medications for managing symptoms and prolonging life Proper diuretic administration and potassium supplementation Importance of blood pressure control -Counseled on diet and exercise extensively Recommended to continue current medication  Patient Goals/Self-Care Activities Patient will:  - take medications as prescribed as evidenced by patient report and record review collaborate with provider on medication access solutions  Medication Assistance: Trulicity + basaglar (pending) obtained through [employer] medication assistance program.  Enrollment ends 09/2022  CPP F/U December  [place] Constable, Pharm.D. - [phone]      Kim Buchanan was given information about Chronic Care Management services today including:  CCM service includes personalized support from designated clinical staff supervised by her physician, including individualized plan of care and coordination with other care providers 24/7 contact phone numbers for assistance for urgent and routine care needs. Standard insurance,  coinsurance, copays and deductibles apply for chronic care management only during months in which we provide at least 20 minutes of these services. Most insurances cover these services at 100%, however patients may be responsible for any  copay, coinsurance and/or deductible if applicable. This service may help you avoid the need for more expensive face-to-face services. Only one practitioner may furnish and bill the service in a calendar month. The patient may stop CCM services at any time (effective at the end of the month) by phone call to the office staff.  Patient agreed to services and verbal consent obtained.   The patient verbalized understanding of instructions, educational materials, and care plan provided today and DECLINED offer to receive copy of patient instructions, educational materials, and care plan.  The pharmacy team will reach out to the patient again over the next 30 days.   Kim Buchanan, [place]

## 2022-06-06 ENCOUNTER — Other Ambulatory Visit (HOSPITAL_COMMUNITY): Payer: Self-pay | Admitting: Nurse Practitioner

## 2022-06-06 NOTE — Telephone Encounter (Signed)
Requested medication (s) are due for refill today: yes  Requested medication (s) are on the active medication list: yes  Last refill:  07/27/21  Future visit scheduled: yes  Notes to clinic:  Unable to refill per protocol, Rx request is too soon, last refill 07/27/21 for 30 and 11 refills. Protocol not attached, routing for review.     Requested Prescriptions  Pending Prescriptions Disp Refills   digoxin (LANOXIN) 0.125 MG tablet [Pharmacy Med Name: DIGOXIN 0.125 MG TABLET] 30 tablet 0    Sig: Take 1 tablet (0.125 mg total) by mouth daily.     There is no refill protocol information for this order

## 2022-06-06 NOTE — Telephone Encounter (Signed)
Pt alst seen 05/31/22. Req refill for digoxin #30 with 11RF. Seems like she should not be out yet. Please advise. Next follow up with PCP scheduled for 08/31/2022.

## 2022-06-09 ENCOUNTER — Other Ambulatory Visit (HOSPITAL_COMMUNITY): Payer: Self-pay | Admitting: Cardiology

## 2022-06-11 ENCOUNTER — Other Ambulatory Visit (HOSPITAL_COMMUNITY): Payer: Self-pay | Admitting: *Deleted

## 2022-06-11 MED ORDER — DIGOXIN 125 MCG PO TABS: 0.1250 mg | ORAL_TABLET | Freq: Every day | ORAL | 11 refills | Status: DC

## 2022-06-15 ENCOUNTER — Telehealth: Payer: Self-pay

## 2022-06-15 NOTE — Chronic Care Management (AMB) (Signed)
    Chronic Care Management Pharmacy Assistant   Name: Kim Buchanan  MRN: [id number] DOB: 1951-03-27   Reason for Encounter: Patient assistance application for Humalog    Medications: Outpatient Encounter Medications as of 06/15/2022  Medication Sig   amiodarone (PACERONE) 200 MG tablet Take 1 tablet (200 mg total) by mouth daily.   aspirin 81 MG tablet Take 81 mg by mouth daily.    atorvastatin (LIPITOR) 10 MG tablet Take 1 tablet (10 mg total) by mouth at bedtime.   digoxin (LANOXIN) 0.125 MG tablet Take 1 tablet (0.125 mg total) by mouth daily.   Dulaglutide (TRULICITY) 1.5 AG/5.3MI SOPN Inject 1.5 mg into the skin once a week. (Patient taking differently: Inject 4.5 mg into the skin once a week.)   gabapentin (NEURONTIN) 300 MG capsule Take 1 capsule (300 mg total) by mouth 3 (three) times daily.   Insulin Glargine (BASAGLAR KWIKPEN) 100 UNIT/ML Inject 30 Units into the skin daily. ATLANTUS PROTOCOL  MDD: 45 units   Insulin Pen Needle 30G X 6 MM MISC 1 Application by Does not apply route daily.   Lancets (ONETOUCH DELICA PLUS [id number]) MISC TEST ONCE DAILY   metFORMIN (GLUCOPHAGE) 1000 MG tablet TAKE 1 TABLET BY MOUTH TWICE  DAILY WITH MEALS   metoprolol succinate (TOPROL-XL) 50 MG 24 hr tablet Take 1 tablet (50 mg total) by mouth daily.   ONETOUCH ULTRA test strip TEST ONCE DAILY   pantoprazole (PROTONIX) 40 MG tablet TAKE 1 TABLET BY MOUTH  DAILY   polyethylene glycol powder (GLYCOLAX/MIRALAX) 17 GM/SCOOP powder Take 17 g by mouth daily.   potassium chloride (KLOR-CON) 10 MEQ tablet Take 1 tablet (10 mEq total) by mouth as directed. Take with your Torsemide.   QUEtiapine (SEROQUEL) 200 MG tablet Take 1 tablet (200 mg total) by mouth daily. Patient takes at bed time.   spironolactone (ALDACTONE) 25 MG tablet Take 1 tablet (25 mg total) by mouth daily.   torsemide (DEMADEX) 20 MG tablet Patient takes 1 tablet by mouth one day then 2 tablets by mouth every other day.    valsartan (DIOVAN) 40 MG tablet Take 1 tablet (40 mg total) by mouth 2 (two) times daily.   No facility-administered encounter medications on file as of 06/15/2022.   I have prefilled and mailed out patient assistance application on Humalog. Patient did not answer and I have left a voicemail for her to call back with any questions about the form for Humalog. I have left my contact information as well. Patient will need to complete the highlighted areas of the form, sign, and date as well and bring back to the office for PCP signature.  Corrie Mckusick, [place]

## 2022-06-20 ENCOUNTER — Telehealth: Payer: Self-pay | Admitting: Nurse Practitioner

## 2022-06-20 NOTE — Telephone Encounter (Signed)
Noted  

## 2022-06-20 NOTE — Telephone Encounter (Signed)
Ready for signature,placed in your folder.

## 2022-06-20 NOTE — Telephone Encounter (Signed)
Patients spouse dropped of Scientist, forensic. Application was placed in the folder for provider to complete.

## 2022-06-21 DIAGNOSIS — I501 Left ventricular failure: Secondary | ICD-10-CM

## 2022-06-21 DIAGNOSIS — E1159 Type 2 diabetes mellitus with other circulatory complications: Secondary | ICD-10-CM

## 2022-06-21 NOTE — Telephone Encounter (Signed)
Paperwork has been placed in Regulatory affairs officer.

## 2022-06-21 NOTE — Telephone Encounter (Signed)
Noted, thank you

## 2022-06-21 NOTE — Telephone Encounter (Signed)
Form signed for patient.

## 2022-06-22 ENCOUNTER — Ambulatory Visit (HOSPITAL_COMMUNITY)
Admission: RE | Admit: 2022-06-22 | Discharge: 2022-06-22 | Disposition: A | Discharge status: Home / Self Care | Payer: Medicare Other | Source: Ambulatory Visit | Attending: Cardiology | Admitting: Cardiology

## 2022-06-22 ENCOUNTER — Telehealth (HOSPITAL_COMMUNITY): Payer: Self-pay | Admitting: *Deleted

## 2022-06-22 ENCOUNTER — Encounter (HOSPITAL_COMMUNITY): Payer: Self-pay | Admitting: Cardiology

## 2022-06-22 ENCOUNTER — Ambulatory Visit: Payer: Medicare Other | Admitting: Nurse Practitioner

## 2022-06-22 VITALS — BP 94/60 | HR 83 | Wt 207.2 lb

## 2022-06-22 DIAGNOSIS — Z79899 Other long term (current) drug therapy: Secondary | ICD-10-CM | POA: Insufficient documentation

## 2022-06-22 DIAGNOSIS — I447 Left bundle-branch block, unspecified: Secondary | ICD-10-CM | POA: Diagnosis not present

## 2022-06-22 DIAGNOSIS — J449 Chronic obstructive pulmonary disease, unspecified: Secondary | ICD-10-CM | POA: Diagnosis not present

## 2022-06-22 DIAGNOSIS — I493 Ventricular premature depolarization: Secondary | ICD-10-CM

## 2022-06-22 DIAGNOSIS — R7989 Other specified abnormal findings of blood chemistry: Secondary | ICD-10-CM | POA: Insufficient documentation

## 2022-06-22 DIAGNOSIS — I428 Other cardiomyopathies: Secondary | ICD-10-CM | POA: Diagnosis not present

## 2022-06-22 DIAGNOSIS — Z7984 Long term (current) use of oral hypoglycemic drugs: Secondary | ICD-10-CM | POA: Diagnosis not present

## 2022-06-22 DIAGNOSIS — Z7982 Long term (current) use of aspirin: Secondary | ICD-10-CM | POA: Insufficient documentation

## 2022-06-22 DIAGNOSIS — E119 Type 2 diabetes mellitus without complications: Secondary | ICD-10-CM | POA: Diagnosis not present

## 2022-06-22 DIAGNOSIS — I5022 Chronic systolic (congestive) heart failure: Secondary | ICD-10-CM | POA: Diagnosis not present

## 2022-06-22 DIAGNOSIS — I2510 Atherosclerotic heart disease of native coronary artery without angina pectoris: Secondary | ICD-10-CM | POA: Insufficient documentation

## 2022-06-22 DIAGNOSIS — Z87891 Personal history of nicotine dependence: Secondary | ICD-10-CM | POA: Diagnosis not present

## 2022-06-22 LAB — COMPREHENSIVE METABOLIC PANEL
ALT: 122 U/L — ABNORMAL HIGH (ref 0–44)
AST: 131 U/L — ABNORMAL HIGH (ref 15–41)
Albumin: 4.1 g/dL (ref 3.5–5.0)
Alkaline Phosphatase: 185 U/L — ABNORMAL HIGH (ref 38–126)
Anion gap: 14 (ref 5–15)
BUN: 14 mg/dL (ref 8–23)
CO2: 24 mmol/L (ref 22–32)
Calcium: 9.2 mg/dL (ref 8.9–10.3)
Chloride: 102 mmol/L (ref 98–111)
Creatinine, Ser: 0.78 mg/dL (ref 0.44–1.00)
GFR, Estimated: >60 mL/min (ref >60)
Glucose, Bld: 230 mg/dL — ABNORMAL HIGH (ref 70–99)
Potassium: 3.8 mmol/L (ref 3.5–5.1)
Sodium: 140 mmol/L (ref 135–145)
Total Bilirubin: 1.1 mg/dL (ref 0.3–1.2)
Total Protein: 8.4 g/dL — ABNORMAL HIGH (ref 6.5–8.1)

## 2022-06-22 LAB — DIGOXIN LEVEL: Digoxin Level: 0.9 ng/mL (ref 0.8–2.0)

## 2022-06-22 LAB — T4, FREE: Free T4: 0.88 ng/dL (ref 0.61–1.12)

## 2022-06-22 LAB — TSH: TSH: 8.726 u[IU]/mL — ABNORMAL HIGH (ref 0.350–4.500)

## 2022-06-22 MED ORDER — AMIODARONE HCL 200 MG PO TABS: 100.0000 mg | ORAL_TABLET | Freq: Every day | ORAL | 3 refills | Status: DC

## 2022-06-22 MED ORDER — VALSARTAN 40 MG PO TABS: 60.0000 mg | ORAL_TABLET | Freq: Two times a day (BID) | ORAL | 3 refills | Status: DC

## 2022-06-22 NOTE — Telephone Encounter (Signed)
Called patient per Dr. Aundra Dubin, based on lab results today, instructed patient to decrease amiodarone to 100 mg (1/2 tablet) daily. Pt verbalized understanding of same.

## 2022-06-22 NOTE — Patient Instructions (Signed)
Increase your Valsartan to '60mg'$  (1 1/2 Tab) Twice daily  Labs done today, your results will be available in MyChart, we will contact you for abnormal readings.  Repeat blood work in 2 weeks at [place] recommends that you schedule a follow-up appointment in: 2 months  If you have any questions or concerns before your next appointment please send [place] a message through [place] or call our office at [phone].    TO LEAVE A MESSAGE FOR THE NURSE SELECT OPTION 2, PLEASE LEAVE A MESSAGE INCLUDING: YOUR NAME DATE OF BIRTH CALL BACK NUMBER REASON FOR CALL**this is important as we prioritize the call backs  YOU WILL RECEIVE A CALL BACK THE SAME DAY AS LONG AS YOU CALL BEFORE 4:00 PM  At the [facility], you and your health needs are our priority. As part of our continuing mission to provide you with exceptional heart care, we have created designated Provider Care Teams. These Care Teams include your primary Cardiologist (physician) and Advanced Practice Providers (APPs- Physician Assistants and Nurse Practitioners) who all work together to provide you with the care you need, when you need it.   You may see any of the following providers on your designated Care Team at your next follow up: Dr Glori Bickers Dr Loralie Champagne Dr. Roxana Hires, NP Lyda Jester, [place] [facility] [place], [place] Forestine Na, NP Audry Riles, PharmD   Please be sure to bring in all your medications bottles to every appointment.

## 2022-06-23 LAB — T3, FREE: T3, Free: 2.1 pg/mL (ref 2.0–4.4)

## 2022-06-24 NOTE — Progress Notes (Signed)
PCP: [name], NP Cardiology: Dr. Rockey Situ HF Cardiology: Dr. Aundra Dubin  71 y.o. with history of chronic systolic CHF/nonischemic cardiomyopathy, type 2 diabetes, and prior smoking was referred by Dr. Quentin Ore for evaluation of CHF.  She has been noted to have a cardiomyopathy since 2020.  Echo in 11/20 showed EF 25-30%.  LHC in 11/20 and coronary CTA in 9/21 have shown nonobstructive CAD.  She has been evaluated by pulmonary, mild restrictive lung disease and mild COPD.  She quit smoking in 2020, she does not drink ETOH.   Echo in 3/22 showed EF 30-35%, with normal RV.   [place] in 5/22 was worrisome, showing right > left heart failure and low cardiac output with low PAPI.   Zio patch in 8/22 showed 21% PVCs.  Amiodarone was started.  Cardiac MRI in 9/22 showed EF 40%, septal-lateral dyssynchrony, RV EF 45%, LGE in the mid-wall apical lateral and apical septal walls. Repeat Zio patch in 10/22 showed 5.8% PVCs, improved.    Patient stopped [place] after 3 yeast infections.   Echo in 5/23 showed EF 35-40%, mildly decreased RV systolic function, trivial MR.    She returns for followup of CHF.  Weight down 11 lbs.  Still short of breath with hills/inclines.  She walked across [place] recently without problems.  Overall better but still gets short of breath at times.  Does fine in house and with ADLs.  No orthopnea/PND.  Rare lightheadedness with standing. No chest pain.   ECG (personally reviewed): NSR, septal Qs  Labs (6/22): K 4.4, creatinine 0.79 Labs (2/23): K 4.8, creatinine 0.95, LDL 18, TSH 5.37, ALT 102, AST 95, hgb 15.2 Labs (8/23): LDL 74, K 4.3, creatinine 0.92, AST 33, ALT 46  PMH: 1. Chronic systolic CHF: Nonischemic cardiomyopathy.   - Echo (6/20): Normal EF - Cardiolite (6/20): Anterior ischemia.  - Echo (11/20): EF 25-30% - LHC (11/20): 70% pRCA stenosis, FFR negative.  - Echo (8/21): EF 30-35% - Coronary CTA (9/21): Mild RCA disease, 40-59% mLAD stenosis but CT FFR negative.   - Echo (3/22): EF 30-35% with normal RV - RHC (5/22): mean RA 17, mean PA 28, mean PCWP 20, CI 1.3, PAPI < 1.  - Cardiac MRI (9/22): EF 40%, septal-lateral dyssynchrony, RV EF 45%, LGE in the mid-wall apical lateral and apical septal walls. - Echo (5/23): EF 35-40%, mildly decreased RV systolic function, trivial MR.   2. COPD: Mild.  Quit smoking in 2020.  3. Restrictive lung disease: Mild restriction on PFTs.  4. Type 2 diabetes 5. Coronary artery disease: Nonobstructive.  6. Hyperlipidemia 7. OSA 8. GERD 9. Depression 10. Elevated LFTs: Long-standing.  HCV negative.  11. PVCs: Zio patch in 8/22 showed 21% PVCs.  Amiodarone was started. Repeat Zio patch in 10/22 showed 5.8% PVCs, improved.   12. Sleep study (8/22): No OSA.  13. Elevated LFTs: ?NAFLD  FH: Brother with "heart surgery," ?CHF.  SH: Married, no ETOH, quit smoking in 2020, retired, lives in [place]: All systems reviewed and negative except as per HPI.   Current Outpatient Medications  Medication Sig Dispense Refill   aspirin 81 MG tablet Take 81 mg by mouth daily.      atorvastatin (LIPITOR) 10 MG tablet Take 1 tablet (10 mg total) by mouth at bedtime. 90 tablet 3   digoxin (LANOXIN) 0.125 MG tablet Take 1 tablet (0.125 mg total) by mouth daily. 30 tablet 11   Dulaglutide (TRULICITY) 1.5 PP/5.0DT SOPN Inject 4.5 mg into the skin once  a week.     gabapentin (NEURONTIN) 300 MG capsule Take 1 capsule (300 mg total) by mouth 3 (three) times daily. 90 capsule 0   insulin glargine (LANTUS) 100 UNIT/ML injection Inject 46 Units into the skin every evening.     Insulin Pen Needle 30G X 6 MM MISC 1 Application by Does not apply route daily. 100 each 2   Lancets (ONETOUCH DELICA PLUS [id number]) MISC TEST ONCE DAILY 100 each 3   metFORMIN (GLUCOPHAGE) 1000 MG tablet TAKE 1 TABLET BY MOUTH TWICE  DAILY WITH MEALS 180 tablet 3   metoprolol succinate (TOPROL-XL) 50 MG 24 hr tablet Take 1 tablet (50 mg total) by mouth daily. 90  tablet 3   ONETOUCH ULTRA test strip TEST ONCE DAILY 100 strip 3   pantoprazole (PROTONIX) 40 MG tablet TAKE 1 TABLET BY MOUTH  DAILY 90 tablet 3   polyethylene glycol powder (GLYCOLAX/MIRALAX) 17 GM/SCOOP powder Take 17 g by mouth daily.     QUEtiapine (SEROQUEL) 200 MG tablet Take 1 tablet (200 mg total) by mouth daily. Patient takes at bed time. 90 tablet 1   spironolactone (ALDACTONE) 25 MG tablet Take 1 tablet (25 mg total) by mouth daily. 30 tablet 11   torsemide (DEMADEX) 20 MG tablet Patient takes 1 tablet by mouth one day then 2 tablets by mouth every other day.     amiodarone (PACERONE) 200 MG tablet Take 0.5 tablets (100 mg total) by mouth daily. 90 tablet 3   valsartan (DIOVAN) 40 MG tablet Take 1.5 tablets (60 mg total) by mouth 2 (two) times daily. 180 tablet 3   No current facility-administered medications for this encounter.   BP 94/60   Pulse 83   Wt 94 kg (207 lb 3.2 oz)   SpO2 95%   BMI 37.90 kg/m  General: NAD Neck: No JVD, no thyromegaly or thyroid nodule.  Lungs: Clear to auscultation bilaterally with normal respiratory effort. CV: Nondisplaced PMI.  Heart regular S1/S2, no S3/S4, no murmur.  No peripheral edema.  No carotid bruit.  Normal pedal pulses.  Abdomen: Soft, nontender, no hepatosplenomegaly, no distention.  Skin: Intact without lesions or rashes.  Neurologic: Alert and oriented x 3.  Psych: Normal affect. Extremities: No clubbing or cyanosis.  HEENT: Normal.   Assessment/Plan: 1. Chronic systolic CHF: Nonischemic cardiomyopathy.  Echo in 3/22 with EF 30-35%, normal RV.  LHC in 11/20 and coronary CTA in 9/21 showed nonobstructive CAD.  [place] in 5/22 was worrisome with R>L heart failure and low cardiac output as well as low PAPI.  Cardiac MRI in 9/22 showed EF 40%, septal-lateral dyssynchrony, RV EF 45%, LGE in the mid-wall apical lateral and apical septal walls.  This was concerning for possible myocarditis (less likely amyloidosis or sarcoidosis).  Echo in  5/23 with EF 35-40%, mildly decreased RV systolic function, trivial MR.  Also concern that PVCs may play a role as Zio monitor in 8/22 showed 21% PVCs, improved to 5.8% with amiodarone.  NYHA class II-III still, now volume overloaded.   - EF appears out of ICD range, QRS not wide enough for CRT.  - Continue torsemide 40 mg daily alternating with 20 mg daily with BMET today.  - Continue digoxin 0.125 daily, check level today.  - Increase valsartan to 60 mg bid.  She could not tolerate Entresto due to cough. BMET 10 days.  - Continue spironolactone 25 mg daily.   - Off dapagliflozin with multiple yeast infections.  - Continue Toprol XL to  50 mg daily.  2. CAD: nonobstructive by LHC in 11/20 and coronary CTA in 9/21.  - Continue atorvastatin.  - Continue ASA 81 daily.  3. PVCs: 21% PVCs on 8/22 Zio monitor.  ?Playing role in cardiomyopathy. PVCs down to 5.8% in 10/22 on amiodarone.  - Continue amiodarone 200 mg daily.  TSH mildly elevated recently, will need to recheck with free T3 and free T4.  Also has chronic mild elevation in LFTs (see below), check today.  4. Elevated LFTs: This appears to be chronic and mild.  LFT elevation pre-dates amiodarone use, and amiodarone does not seem to have worsened LFTs.  Suspect NAFLD.  She had workup for viral hepatitis (negative) in the past.   - Follows with GI.  - Recheck LFTs today.    Followup APP in 2 months.    Loralie Champagne 06/24/2022

## 2022-07-03 ENCOUNTER — Telehealth (HOSPITAL_COMMUNITY): Payer: Self-pay | Admitting: Pharmacy Technician

## 2022-07-03 ENCOUNTER — Other Ambulatory Visit (HOSPITAL_COMMUNITY): Payer: Self-pay

## 2022-07-03 NOTE — Telephone Encounter (Signed)
Patient Advocate Encounter   Received notification from [facility] that prior authorization for Valsartan '40mg'$  ('60mg'$  bid) is required.   PA submitted on CoverMyMeds Key [id number] Status is pending   Will continue to follow.

## 2022-07-05 NOTE — Telephone Encounter (Signed)
Advanced Heart Failure Patient Advocate Encounter  Prior Authorization for Valsartan ('60mg'$  bid) has been approved.    PA#  [id number] Effective dates: 07/03/22 through 10/21/22  Charlann Boxer, CPhT

## 2022-07-12 ENCOUNTER — Encounter: Payer: Self-pay | Admitting: Nurse Practitioner

## 2022-07-12 ENCOUNTER — Ambulatory Visit (INDEPENDENT_AMBULATORY_CARE_PROVIDER_SITE_OTHER): Payer: Medicare Other | Admitting: Nurse Practitioner

## 2022-07-12 DIAGNOSIS — E1142 Type 2 diabetes mellitus with diabetic polyneuropathy: Secondary | ICD-10-CM

## 2022-07-12 MED ORDER — GLIMEPIRIDE 2 MG PO TABS
2.0000 mg | ORAL_TABLET | Freq: Every day | ORAL | 0 refills | Status: DC
Start: 2022-07-12 — End: 2022-08-14

## 2022-07-12 NOTE — Assessment & Plan Note (Signed)
Chronic. Not well controlled.  Sugars running 180s-300 fasting.  I will increase from 70u of insulin to 80u daily.  Will add Glimepiride '2mg'$  daily.  Will increase to '4mg'$  if tolerating well at 1 month follow up.  Discussed the risks and benefits of medication.  Lengthy discussion had with patient about diet.  Declines going to Diabetes education at this time.  Follow up in 1 month.  Call sooner if concerns arise.

## 2022-07-12 NOTE — Progress Notes (Signed)
BP 110/65   Pulse 80   Temp 97.6 F (36.4 C) (Oral)   Wt 209 lb (94.8 kg)   SpO2 98%   BMI 38.23 kg/m    Subjective:    Patient ID: Kim Buchanan, female    DOB: [birth year], 71 y.o.   MRN: [id number]  HPI: Kim Buchanan is a 71 y.o. female  Chief Complaint  Patient presents with   Diabetes    Patient reports blood sugars are still in the 300's.Pt reports this AM was 178. Pt would like to discuss dosage on insulin.    DIABETES Hypoglycemic episodes:no Polydipsia/polyuria: no Visual disturbance: no Chest pain: no Paresthesias: no Glucose Monitoring: yes  Accucheck frequency: Daily  Fasting glucose: 180-300  Post prandial:  Evening:  Before meals: Taking Insulin?: yes  Long acting insulin:  Short acting insulin:   Relevant past medical, surgical, family and social history reviewed and updated as indicated. Interim medical history since our last visit reviewed. Allergies and medications reviewed and updated.  Review of Systems  Eyes:  Negative for visual disturbance.  Cardiovascular:  Negative for chest pain.  Endocrine: Negative for polydipsia and polyuria.  Neurological:  Negative for numbness.    Per HPI unless specifically indicated above     Objective:    BP 110/65   Pulse 80   Temp 97.6 F (36.4 C) (Oral)   Wt 209 lb (94.8 kg)   SpO2 98%   BMI 38.23 kg/m   Wt Readings from Last 3 Encounters:  07/12/22 209 lb (94.8 kg)  06/22/22 207 lb 3.2 oz (94 kg)  05/31/22 206 lb 8 oz (93.7 kg)    Physical Exam Vitals and nursing note reviewed.  Constitutional:      General: She is not in acute distress.    Appearance: Normal appearance. She is obese. She is not ill-appearing, toxic-appearing or diaphoretic.  HENT:     Head: Normocephalic.     Right Ear: External ear normal.     Left Ear: External ear normal.     Nose: Nose normal.     Mouth/Throat:     Mouth: Mucous membranes are moist.     Pharynx: Oropharynx is clear.  Eyes:     General:         Right eye: No discharge.        Left eye: No discharge.     Extraocular Movements: Extraocular movements intact.     Conjunctiva/sclera: Conjunctivae normal.     Pupils: Pupils are equal, round, and reactive to light.  Cardiovascular:     Rate and Rhythm: Normal rate and regular rhythm.     Heart sounds: No murmur heard. Pulmonary:     Effort: Pulmonary effort is normal. No respiratory distress.     Breath sounds: Normal breath sounds. No wheezing or rales.  Musculoskeletal:     Cervical back: Normal range of motion and neck supple.  Skin:    General: Skin is warm and dry.     Capillary Refill: Capillary refill takes less than 2 seconds.  Neurological:     General: No focal deficit present.     Mental Status: She is alert and oriented to person, place, and time. Mental status is at baseline.  Psychiatric:        Mood and Affect: Mood normal.        Behavior: Behavior normal.        Thought Content: Thought content normal.  Judgment: Judgment normal.     Results for orders placed or performed during the hospital encounter of 06/22/22  Comprehensive Metabolic Panel (CMET)  Result Value Ref Range   Sodium 140 135 - 145 mmol/L   Potassium 3.8 3.5 - 5.1 mmol/L   Chloride 102 98 - 111 mmol/L   CO2 24 22 - 32 mmol/L   Glucose, Bld 230 (H) 70 - 99 mg/dL   BUN 14 8 - 23 mg/dL   Creatinine, Ser 0.78 0.44 - 1.00 mg/dL   Calcium 9.2 8.9 - 10.3 mg/dL   Total Protein 8.4 (H) 6.5 - 8.1 g/dL   Albumin 4.1 3.5 - 5.0 g/dL   AST 131 (H) 15 - 41 U/L   ALT 122 (H) 0 - 44 U/L   Alkaline Phosphatase 185 (H) 38 - 126 U/L   Total Bilirubin 1.1 0.3 - 1.2 mg/dL   GFR, Estimated >60 >60 mL/min   Anion gap 14 5 - 15  TSH  Result Value Ref Range   TSH 8.726 (H) 0.350 - 4.500 uIU/mL  T3, free  Result Value Ref Range   T3, Free 2.1 2.0 - 4.4 pg/mL  T4, free  Result Value Ref Range   Free T4 0.88 0.61 - 1.12 ng/dL  Digoxin level  Result Value Ref Range   Digoxin Level 0.9 0.8 -  2.0 ng/mL      Assessment & Plan:   Problem List Items Addressed This Visit       Endocrine   Type 2 diabetes mellitus with peripheral neuropathy (HCC)    Chronic. Not well controlled.  Sugars running 180s-300 fasting.  I will increase from 70u of insulin to 80u daily.  Will add Glimepiride '2mg'$  daily.  Will increase to '4mg'$  if tolerating well at 1 month follow up.  Discussed the risks and benefits of medication.  Lengthy discussion had with patient about diet.  Declines going to Diabetes education at this time.  Follow up in 1 month.  Call sooner if concerns arise.       Relevant Medications   glimepiride (AMARYL) 2 MG tablet     Follow up plan: Return in about 1 month (around 08/11/2022) for Medication Management.

## 2022-07-23 ENCOUNTER — Telehealth: Payer: Self-pay

## 2022-07-23 ENCOUNTER — Encounter: Payer: Self-pay | Admitting: Nurse Practitioner

## 2022-07-23 NOTE — Progress Notes (Signed)
Chronic Care Management Pharmacy Assistant   Name: Kim Buchanan  MRN: [id number] DOB: 1951/06/22   Reason for Encounter: Disease State   Conditions to be addressed/monitored: DMII   Recent office visits:  07/12/22 Kim Billings, NP (Diabetes) Orders: none; Medication changes: glimepiride 2 mg  Recent consult visits:  None noted  Hospital visits:  None in previous 6 months  Medications: Outpatient Encounter Medications as of 07/23/2022  Medication Sig   amiodarone (PACERONE) 200 MG tablet Take 0.5 tablets (100 mg total) by mouth daily.   aspirin 81 MG tablet Take 81 mg by mouth daily.    atorvastatin (LIPITOR) 10 MG tablet Take 1 tablet (10 mg total) by mouth at bedtime.   digoxin (LANOXIN) 0.125 MG tablet Take 1 tablet (0.125 mg total) by mouth daily.   Dulaglutide (TRULICITY) 1.5 HQ/7.5FF SOPN Inject 4.5 mg into the skin once a week.   gabapentin (NEURONTIN) 300 MG capsule Take 1 capsule (300 mg total) by mouth 3 (three) times daily.   glimepiride (AMARYL) 2 MG tablet Take 1 tablet (2 mg total) by mouth daily with breakfast.   insulin glargine (LANTUS) 100 UNIT/ML injection Inject 80 Units into the skin every evening.   Insulin Pen Needle 30G X 6 MM MISC 1 Application by Does not apply route daily.   Lancets (ONETOUCH DELICA PLUS [id number]) MISC TEST ONCE DAILY   metFORMIN (GLUCOPHAGE) 1000 MG tablet TAKE 1 TABLET BY MOUTH TWICE  DAILY WITH MEALS   metoprolol succinate (TOPROL-XL) 50 MG 24 hr tablet Take 1 tablet (50 mg total) by mouth daily.   ONETOUCH ULTRA test strip TEST ONCE DAILY   pantoprazole (PROTONIX) 40 MG tablet TAKE 1 TABLET BY MOUTH  DAILY   polyethylene glycol powder (GLYCOLAX/MIRALAX) 17 GM/SCOOP powder Take 17 g by mouth daily.   QUEtiapine (SEROQUEL) 200 MG tablet Take 1 tablet (200 mg total) by mouth daily. Patient takes at bed time.   spironolactone (ALDACTONE) 25 MG tablet Take 1 tablet (25 mg total) by mouth daily.   torsemide (DEMADEX) 20 MG  tablet Patient takes 1 tablet by mouth one day then 2 tablets by mouth every other day.   valsartan (DIOVAN) 40 MG tablet Take 1.5 tablets (60 mg total) by mouth 2 (two) times daily.   No facility-administered encounter medications on file as of 07/23/2022.   Recent Relevant Labs: Lab Results  Component Value Date/Time   HGBA1C 13.8 (H) 05/31/2022 10:02 AM   HGBA1C 11.9 (H) 02/21/2022 08:20 AM   HGBA1C 8.6 (H) 01/23/2021 03:08 PM   HGBA1C 7.7 (H) 10/17/2020 10:21 AM   MICROALBUR 10 08/25/2021 10:23 AM   MICROALBUR 30 (H) 06/15/2020 03:45 PM    Kidney Function Lab Results  Component Value Date/Time   CREATININE 0.78 06/22/2022 11:21 AM   CREATININE 0.92 05/31/2022 10:02 AM   CREATININE 0.82 01/01/2018 12:00 AM   CREATININE 0.75 08/06/2016 08:24 AM   GFRNONAA >60 06/22/2022 11:21 AM   GFRNONAA 76 01/01/2018 12:00 AM   GFRAA 79 10/17/2020 10:27 AM   GFRAA 88 01/01/2018 12:00 AM   Unsuccessful outbound call made today to assist with:  DM assessment  Outreach Attempt:  3rd Attempt, to reach patient.  A HIPAA compliant voice message was left requesting a return call.  Instructed patient to call back at  earliest convenience.  Current antihyperglycemic regimen:  Trulicity 1.5 JT/7.0 ml inject 4.5 mg into skin once a week Lantus 100 units inject 80 units into skin every evening Metformin 1000  mg 1 tab twice daily  Adherence Review: Is the patient currently on a STATIN medication? Yes Is the patient currently on ACE/ARB medication? Yes Does the patient have >5 day gap between last estimated fill dates? No   Care Gaps: Colonoscopy-01/04/22 Diabetic Foot Exam-NA Mammogram-01/30/22 Ophthalmology-02/08/22 Dexa Scan - NA Annual Well Visit - 12/04/21 (Medicare) Micro albumin-NA Hemoglobin A1c- 05/31/22  Star Rating Drugs: Atorvastatin 10 mg-last fill 05/26/22 90 ds, 03/02/22 90  Metformin 50 mg-last fill 05/26/22 90 ds, 03/02/22 90 ds Valsartan 40 mg-last fill 07/04/22 90 ds, 05/20/22 90  ds  [name] [phone]

## 2022-07-25 NOTE — Telephone Encounter (Signed)
error 

## 2022-08-09 ENCOUNTER — Ambulatory Visit (INDEPENDENT_AMBULATORY_CARE_PROVIDER_SITE_OTHER): Payer: Medicare Other | Admitting: Gastroenterology

## 2022-08-09 DIAGNOSIS — Z23 Encounter for immunization: Secondary | ICD-10-CM | POA: Diagnosis not present

## 2022-08-09 NOTE — Progress Notes (Signed)
Per orders of Dr. Allen Norris, injection of 3 of 3 Hep A given in Right Deltoid by Lurlean Nanny.  Patient tolerated injection well.

## 2022-08-13 NOTE — Progress Notes (Signed)
BP 112/62   Pulse 76   Temp 98 F (36.7 C) (Oral)   Wt 212 lb 8 oz (96.4 kg)   SpO2 93%   BMI 38.87 kg/m    Subjective:    Patient ID: Kim Buchanan, female    DOB: 11/27/1950, 71 y.o.   MRN: [id number]  HPI: Kim Buchanan is a 71 y.o. female  Chief Complaint  Patient presents with   Medication Management    Pt req refills on glimepiride   DIABETES Hypoglycemic episodes:no Polydipsia/polyuria: no Visual disturbance: no Chest pain: no Paresthesias: no Glucose Monitoring: yes  Accucheck frequency: Daily  Fasting glucose: <150  Post prandial:  Evening:  Before meals: Taking Insulin?: yes  Long acting insulin: Lantus  Short acting insulin:  Blood Pressure Monitoring: not checking Retinal Examination: Up to Date Foot Exam: Up to Date Diabetic Education: Not Completed Pneumovax: Up to Date Influenza: Up to Date Aspirin: no  Relevant past medical, surgical, family and social history reviewed and updated as indicated. Interim medical history since our last visit reviewed. Allergies and medications reviewed and updated.  Review of Systems  Eyes:  Negative for visual disturbance.  Respiratory:  Negative for chest tightness and shortness of breath.   Cardiovascular:  Negative for chest pain, palpitations and leg swelling.  Endocrine: Negative for polydipsia and polyuria.  Neurological:  Negative for dizziness, light-headedness, numbness and headaches.    Per HPI unless specifically indicated above     Objective:    BP 112/62   Pulse 76   Temp 98 F (36.7 C) (Oral)   Wt 212 lb 8 oz (96.4 kg)   SpO2 93%   BMI 38.87 kg/m   Wt Readings from Last 3 Encounters:  08/14/22 212 lb 8 oz (96.4 kg)  07/12/22 209 lb (94.8 kg)  06/22/22 207 lb 3.2 oz (94 kg)    Physical Exam Vitals and nursing note reviewed.  Constitutional:      General: She is not in acute distress.    Appearance: Normal appearance. She is obese. She is not ill-appearing, toxic-appearing  or diaphoretic.  HENT:     Head: Normocephalic.     Right Ear: External ear normal.     Left Ear: External ear normal.     Nose: Nose normal.     Mouth/Throat:     Mouth: Mucous membranes are moist.     Pharynx: Oropharynx is clear.  Eyes:     General:        Right eye: No discharge.        Left eye: No discharge.     Extraocular Movements: Extraocular movements intact.     Conjunctiva/sclera: Conjunctivae normal.     Pupils: Pupils are equal, round, and reactive to light.  Cardiovascular:     Rate and Rhythm: Normal rate and regular rhythm.     Heart sounds: No murmur heard. Pulmonary:     Effort: Pulmonary effort is normal. No respiratory distress.     Breath sounds: Normal breath sounds. No wheezing or rales.  Musculoskeletal:     Cervical back: Normal range of motion and neck supple.  Skin:    General: Skin is warm and dry.     Capillary Refill: Capillary refill takes less than 2 seconds.  Neurological:     General: No focal deficit present.     Mental Status: She is alert and oriented to person, place, and time. Mental status is at baseline.  Psychiatric:  Mood and Affect: Mood normal.        Behavior: Behavior normal.        Thought Content: Thought content normal.        Judgment: Judgment normal.     Results for orders placed or performed during the hospital encounter of 06/22/22  Comprehensive Metabolic Panel (CMET)  Result Value Ref Range   Sodium 140 135 - 145 mmol/L   Potassium 3.8 3.5 - 5.1 mmol/L   Chloride 102 98 - 111 mmol/L   CO2 24 22 - 32 mmol/L   Glucose, Bld 230 (H) 70 - 99 mg/dL   BUN 14 8 - 23 mg/dL   Creatinine, Ser 0.78 0.44 - 1.00 mg/dL   Calcium 9.2 8.9 - 10.3 mg/dL   Total Protein 8.4 (H) 6.5 - 8.1 g/dL   Albumin 4.1 3.5 - 5.0 g/dL   AST 131 (H) 15 - 41 U/L   ALT 122 (H) 0 - 44 U/L   Alkaline Phosphatase 185 (H) 38 - 126 U/L   Total Bilirubin 1.1 0.3 - 1.2 mg/dL   GFR, Estimated >60 >60 mL/min   Anion gap 14 5 - 15  TSH   Result Value Ref Range   TSH 8.726 (H) 0.350 - 4.500 uIU/mL  T3, free  Result Value Ref Range   T3, Free 2.1 2.0 - 4.4 pg/mL  T4, free  Result Value Ref Range   Free T4 0.88 0.61 - 1.12 ng/dL  Digoxin level  Result Value Ref Range   Digoxin Level 0.9 0.8 - 2.0 ng/mL      Assessment & Plan:   Problem List Items Addressed This Visit       Endocrine   Type 2 diabetes mellitus with peripheral neuropathy (HCC) - Primary    Chronic. Improving.  Sugars have improved from 300s to <150s.  Will increase Glimeperide to '4mg'$ .  Dex Com ordered.  Continue with Lantus 80u.  Follow up in 1 month for A1c check.      Relevant Medications   glimepiride (AMARYL) 4 MG tablet   Other Visit Diagnoses     Stopped smoking with greater than 30 pack year history       Stopped smoking 3 years ago.  Has not had CT Lung since 11/2020.   Relevant Orders   CT CHEST LUNG CA SCREEN LOW DOSE W/O CM        Follow up plan: Return in about 1 month (around 09/14/2022) for HTN, HLD, DM2 FU.

## 2022-08-14 ENCOUNTER — Telehealth: Payer: Self-pay | Admitting: Nurse Practitioner

## 2022-08-14 ENCOUNTER — Ambulatory Visit (INDEPENDENT_AMBULATORY_CARE_PROVIDER_SITE_OTHER): Payer: Medicare Other | Admitting: Nurse Practitioner

## 2022-08-14 ENCOUNTER — Encounter: Payer: Self-pay | Admitting: Nurse Practitioner

## 2022-08-14 VITALS — BP 112/62 | HR 76 | Temp 98.0°F | Wt 212.5 lb

## 2022-08-14 DIAGNOSIS — E1142 Type 2 diabetes mellitus with diabetic polyneuropathy: Secondary | ICD-10-CM

## 2022-08-14 DIAGNOSIS — Z87891 Personal history of nicotine dependence: Secondary | ICD-10-CM

## 2022-08-14 MED ORDER — DEXCOM G6 SENSOR MISC: 1.0000 | 6 refills | Status: DC

## 2022-08-14 MED ORDER — GLIMEPIRIDE 2 MG PO TABS: 2.0000 mg | ORAL_TABLET | Freq: Every day | ORAL | 1 refills | Status: DC

## 2022-08-14 MED ORDER — INSULIN LISPRO (1 UNIT DIAL) 100 UNIT/ML (KWIKPEN): 5.0000 [IU] | PEN_INJECTOR | Freq: Three times a day (TID) | SUBCUTANEOUS | 11 refills | Status: DC

## 2022-08-14 MED ORDER — DEXCOM G6 TRANSMITTER MISC: 1.0000 | Freq: Once | 0 refills | Status: AC

## 2022-08-14 MED ORDER — GLIMEPIRIDE 4 MG PO TABS: 4.0000 mg | ORAL_TABLET | Freq: Every day | ORAL | 1 refills | Status: DC

## 2022-08-14 NOTE — Telephone Encounter (Signed)
Please tell her thank you for the update. I added it to her medication list.  We will work on the Prior Auth for her dex com.

## 2022-08-14 NOTE — Telephone Encounter (Signed)
Copied from [place] [id number]. Topic: General - Other >> Aug 14, 2022  9:32 AM [id number] wrote: Reason for CRM: pt called in to update provider with the name of the insulin that she was put on by [facility]. Lilly Humalog quick pen. - 5 3 times a day.   Pt says that she check on dexcom has to be pre-authorized so insurance is unable to tell her how much it cost.

## 2022-08-14 NOTE — Assessment & Plan Note (Signed)
Chronic. Improving.  Sugars have improved from 300s to <150s.  Will increase Glimeperide to '4mg'$ .  Dex Com ordered.  Continue with Lantus 80u.  Follow up in 1 month for A1c check.

## 2022-08-17 ENCOUNTER — Telehealth: Payer: Self-pay

## 2022-08-17 NOTE — Telephone Encounter (Signed)
Patient has been notified

## 2022-08-17 NOTE — Telephone Encounter (Signed)
PA has been initiated via covermymeds for Dexcom. Awaiting determination.   Key: [id number]

## 2022-08-21 ENCOUNTER — Encounter: Payer: Self-pay | Admitting: Nurse Practitioner

## 2022-08-21 ENCOUNTER — Ambulatory Visit (INDEPENDENT_AMBULATORY_CARE_PROVIDER_SITE_OTHER): Payer: Medicare Other | Admitting: Nurse Practitioner

## 2022-08-21 ENCOUNTER — Telehealth: Payer: Self-pay

## 2022-08-21 DIAGNOSIS — E1142 Type 2 diabetes mellitus with diabetic polyneuropathy: Secondary | ICD-10-CM

## 2022-08-21 MED ORDER — DEXCOM G6 RECEIVER DEVI: 1.0000 [IU] | 0 refills | Status: DC

## 2022-08-21 NOTE — Telephone Encounter (Signed)
PA for Dexcom has been approved , patient has been notified via phone call.

## 2022-08-21 NOTE — Progress Notes (Signed)
PCP: [name], NP Cardiology: Dr. Rockey Situ HF Cardiology: Dr. Aundra Dubin  71 y.o. with history of chronic systolic CHF/nonischemic cardiomyopathy, type 2 diabetes, and prior smoking was referred by Dr. Quentin Ore for evaluation of CHF.  She has been noted to have a cardiomyopathy since 2020.  Echo in 11/20 showed EF 25-30%.  LHC in 11/20 and coronary CTA in 9/21 have shown nonobstructive CAD.  She has been evaluated by pulmonary, mild restrictive lung disease and mild COPD.  She quit smoking in 2020, she does not drink ETOH.   Echo in 3/22 showed EF 30-35%, with normal RV.   [place] in 5/22 was worrisome, showing right > left heart failure and low cardiac output with low PAPI.   Zio patch in 8/22 showed 21% PVCs.  Amiodarone was started.  Cardiac MRI in 9/22 showed EF 40%, septal-lateral dyssynchrony, RV EF 45%, LGE in the mid-wall apical lateral and apical septal walls. Repeat Zio patch in 10/22 showed 5.8% PVCs, improved.    Patient stopped [place] after 3 yeast infections.   Echo in 5/23 showed EF 35-40%, mildly decreased RV systolic function, trivial MR.    She returns for followup of CHF.  Weight down 11 lbs.  Still short of breath with hills/inclines.  She walked across [place] recently without problems.  Overall better but still gets short of breath at times.  Does fine in house and with ADLs.  No orthopnea/PND.  Rare lightheadedness with standing. No chest pain.   ECG (personally reviewed): NSR, septal Qs  Labs (6/22): K 4.4, creatinine 0.79 Labs (2/23): K 4.8, creatinine 0.95, LDL 18, TSH 5.37, ALT 102, AST 95, hgb 15.2 Labs (8/23): LDL 74, K 4.3, creatinine 0.92, AST 33, ALT 46  PMH: 1. Chronic systolic CHF: Nonischemic cardiomyopathy.   - Echo (6/20): Normal EF - [place] (6/20): Anterior ischemia.  - Echo (11/20): EF 25-30% - LHC (11/20): 70% pRCA stenosis, FFR negative.  - Echo (8/21): EF 30-35% - Coronary CTA (9/21): Mild RCA disease, 40-59% mLAD stenosis but CT FFR negative.   - Echo (3/22): EF 30-35% with normal RV - RHC (5/22): mean RA 17, mean PA 28, mean PCWP 20, CI 1.3, PAPI < 1.  - Cardiac MRI (9/22): EF 40%, septal-lateral dyssynchrony, RV EF 45%, LGE in the mid-wall apical lateral and apical septal walls. - Echo (5/23): EF 35-40%, mildly decreased RV systolic function, trivial MR.   2. COPD: Mild.  Quit smoking in 2020.  3. Restrictive lung disease: Mild restriction on PFTs.  4. Type 2 diabetes 5. Coronary artery disease: Nonobstructive.  6. Hyperlipidemia 7. OSA 8. GERD 9. Depression 10. Elevated LFTs: Long-standing.  HCV negative.  11. PVCs: Zio patch in 8/22 showed 21% PVCs.  Amiodarone was started. Repeat Zio patch in 10/22 showed 5.8% PVCs, improved.   12. Sleep study (8/22): No OSA.  13. Elevated LFTs: ?NAFLD  FH: Brother with "heart surgery," ?CHF.  SH: Married, no ETOH, quit smoking in 2020, retired, lives in [place]: All systems reviewed and negative except as per HPI.   Current Outpatient Medications  Medication Sig Dispense Refill   amiodarone (PACERONE) 200 MG tablet Take 0.5 tablets (100 mg total) by mouth daily. 90 tablet 3   aspirin 81 MG tablet Take 81 mg by mouth daily.      atorvastatin (LIPITOR) 10 MG tablet Take 1 tablet (10 mg total) by mouth at bedtime. 90 tablet 3   Continuous Blood Gluc Sensor (DEXCOM G6 SENSOR) MISC 1 each by Does  not apply route every 14 (fourteen) days. 3 each 6   digoxin (LANOXIN) 0.125 MG tablet Take 1 tablet (0.125 mg total) by mouth daily. 30 tablet 11   Dulaglutide (TRULICITY) 1.5 LX/7.2IO SOPN Inject 4.5 mg into the skin once a week.     gabapentin (NEURONTIN) 300 MG capsule Take 1 capsule (300 mg total) by mouth 3 (three) times daily. 90 capsule 0   glimepiride (AMARYL) 4 MG tablet Take 1 tablet (4 mg total) by mouth daily with breakfast. 90 tablet 1   insulin glargine (LANTUS) 100 UNIT/ML injection Inject 80 Units into the skin every evening.     insulin lispro (HUMALOG KWIKPEN) 100 UNIT/ML  KwikPen Inject 5 Units into the skin 3 (three) times daily. 15 mL 11   Insulin Pen Needle 30G X 6 MM MISC 1 Application by Does not apply route daily. 100 each 2   Lancets (ONETOUCH DELICA PLUS MBTDHR41U) MISC TEST ONCE DAILY 100 each 3   metFORMIN (GLUCOPHAGE) 1000 MG tablet TAKE 1 TABLET BY MOUTH TWICE  DAILY WITH MEALS 180 tablet 3   metoprolol succinate (TOPROL-XL) 50 MG 24 hr tablet Take 1 tablet (50 mg total) by mouth daily. 90 tablet 3   ONETOUCH ULTRA test strip TEST ONCE DAILY 100 strip 3   pantoprazole (PROTONIX) 40 MG tablet TAKE 1 TABLET BY MOUTH  DAILY 90 tablet 3   polyethylene glycol powder (GLYCOLAX/MIRALAX) 17 GM/SCOOP powder Take 17 g by mouth daily.     QUEtiapine (SEROQUEL) 200 MG tablet Take 1 tablet (200 mg total) by mouth daily. Patient takes at bed time. 90 tablet 1   spironolactone (ALDACTONE) 25 MG tablet Take 1 tablet (25 mg total) by mouth daily. 30 tablet 11   torsemide (DEMADEX) 20 MG tablet Patient takes 1 tablet by mouth one day then 2 tablets by mouth every other day.     valsartan (DIOVAN) 40 MG tablet Take 1.5 tablets (60 mg total) by mouth 2 (two) times daily. 180 tablet 3   No current facility-administered medications for this visit.   There were no vitals taken for this visit. General: NAD Neck: No JVD, no thyromegaly or thyroid nodule.  Lungs: Clear to auscultation bilaterally with normal respiratory effort. CV: Nondisplaced PMI.  Heart regular S1/S2, no S3/S4, no murmur.  No peripheral edema.  No carotid bruit.  Normal pedal pulses.  Abdomen: Soft, nontender, no hepatosplenomegaly, no distention.  Skin: Intact without lesions or rashes.  Neurologic: Alert and oriented x 3.  Psych: Normal affect. Extremities: No clubbing or cyanosis.  HEENT: Normal.   Assessment/Plan: 1. Chronic systolic CHF: Nonischemic cardiomyopathy.  Echo in 3/22 with EF 30-35%, normal RV.  LHC in 11/20 and coronary CTA in 9/21 showed nonobstructive CAD.  [place] in 5/22 was  worrisome with R>L heart failure and low cardiac output as well as low PAPI.  Cardiac MRI in 9/22 showed EF 40%, septal-lateral dyssynchrony, RV EF 45%, LGE in the mid-wall apical lateral and apical septal walls.  This was concerning for possible myocarditis (less likely amyloidosis or sarcoidosis).  Echo in 5/23 with EF 35-40%, mildly decreased RV systolic function, trivial MR.  Also concern that PVCs may play a role as Zio monitor in 8/22 showed 21% PVCs, improved to 5.8% with amiodarone.  NYHA class II-III still, now volume overloaded.   - EF appears out of ICD range, QRS not wide enough for CRT.  - Continue torsemide 40 mg daily alternating with 20 mg daily with BMET today.  -  Continue digoxin 0.125 daily, check level today.  - Increase valsartan to 60 mg bid.  She could not tolerate Entresto due to cough. BMET 10 days.  - Continue spironolactone 25 mg daily.   - Off dapagliflozin with multiple yeast infections.  - Continue Toprol XL to 50 mg daily.  2. CAD: nonobstructive by LHC in 11/20 and coronary CTA in 9/21.  - Continue atorvastatin.  - Continue ASA 81 daily.  3. PVCs: 21% PVCs on 8/22 Zio monitor.  ?Playing role in cardiomyopathy. PVCs down to 5.8% in 10/22 on amiodarone.  - Continue amiodarone 200 mg daily.  TSH mildly elevated recently, will need to recheck with free T3 and free T4.  Also has chronic mild elevation in LFTs (see below), check today.  4. Elevated LFTs: This appears to be chronic and mild.  LFT elevation pre-dates amiodarone use, and amiodarone does not seem to have worsened LFTs.  Suspect NAFLD.  She had workup for viral hepatitis (negative) in the past.   - Follows with GI.  - Recheck LFTs today.    Followup APP in 2 months.    [place] 08/21/2022

## 2022-08-21 NOTE — Progress Notes (Signed)
BP 114/64   Pulse 78   Temp 97.7 F (36.5 C) (Oral)   Wt 210 lb 11.2 oz (95.6 kg)   SpO2 97%   BMI 38.54 kg/m    Subjective:    Patient ID: Kim Buchanan, female    DOB: 04/10/1951, 71 y.o.   MRN: [id number]  HPI: Kim Buchanan is a 71 y.o. female  Chief Complaint  Patient presents with   Diabetes    Patient is here to be shown how to use her dexcom    DIABETES Hypoglycemic episodes:no Polydipsia/polyuria: no Visual disturbance: no Chest pain: no Paresthesias: no Glucose Monitoring: no  Accucheck frequency:  working to get continuous glucose monitoring  Fasting glucose:  Post prandial:  Evening:  Before meals: Taking Insulin?: yes  Long acting insulin: Lantus 80 u  Short acting insulin: Humalog 5u TID   Relevant past medical, surgical, family and social history reviewed and updated as indicated. Interim medical history since our last visit reviewed. Allergies and medications reviewed and updated.  Review of Systems  Eyes:  Negative for visual disturbance.  Cardiovascular:  Negative for chest pain.  Endocrine: Negative for polydipsia and polyuria.  Neurological:  Negative for numbness.    Per HPI unless specifically indicated above     Objective:    BP 114/64   Pulse 78   Temp 97.7 F (36.5 C) (Oral)   Wt 210 lb 11.2 oz (95.6 kg)   SpO2 97%   BMI 38.54 kg/m   Wt Readings from Last 3 Encounters:  08/21/22 210 lb 11.2 oz (95.6 kg)  08/14/22 212 lb 8 oz (96.4 kg)  07/12/22 209 lb (94.8 kg)    Physical Exam Vitals and nursing note reviewed.  Constitutional:      General: She is not in acute distress.    Appearance: Normal appearance. She is normal weight. She is not ill-appearing, toxic-appearing or diaphoretic.  HENT:     Head: Normocephalic.     Right Ear: External ear normal.     Left Ear: External ear normal.     Nose: Nose normal.     Mouth/Throat:     Mouth: Mucous membranes are moist.     Pharynx: Oropharynx is clear.  Eyes:      General:        Right eye: No discharge.        Left eye: No discharge.     Extraocular Movements: Extraocular movements intact.     Conjunctiva/sclera: Conjunctivae normal.     Pupils: Pupils are equal, round, and reactive to light.  Cardiovascular:     Rate and Rhythm: Normal rate and regular rhythm.     Heart sounds: No murmur heard. Pulmonary:     Effort: Pulmonary effort is normal. No respiratory distress.     Breath sounds: Normal breath sounds. No wheezing or rales.  Musculoskeletal:     Cervical back: Normal range of motion and neck supple.  Skin:    General: Skin is warm and dry.     Capillary Refill: Capillary refill takes less than 2 seconds.  Neurological:     General: No focal deficit present.     Mental Status: She is alert and oriented to person, place, and time. Mental status is at baseline.  Psychiatric:        Mood and Affect: Mood normal.        Behavior: Behavior normal.        Thought Content: Thought content normal.  Judgment: Judgment normal.     Results for orders placed or performed during the hospital encounter of 06/22/22  Comprehensive Metabolic Panel (CMET)  Result Value Ref Range   Sodium 140 135 - 145 mmol/L   Potassium 3.8 3.5 - 5.1 mmol/L   Chloride 102 98 - 111 mmol/L   CO2 24 22 - 32 mmol/L   Glucose, Bld 230 (H) 70 - 99 mg/dL   BUN 14 8 - 23 mg/dL   Creatinine, Ser 0.78 0.44 - 1.00 mg/dL   Calcium 9.2 8.9 - 10.3 mg/dL   Total Protein 8.4 (H) 6.5 - 8.1 g/dL   Albumin 4.1 3.5 - 5.0 g/dL   AST 131 (H) 15 - 41 U/L   ALT 122 (H) 0 - 44 U/L   Alkaline Phosphatase 185 (H) 38 - 126 U/L   Total Bilirubin 1.1 0.3 - 1.2 mg/dL   GFR, Estimated >60 >60 mL/min   Anion gap 14 5 - 15  TSH  Result Value Ref Range   TSH 8.726 (H) 0.350 - 4.500 uIU/mL  T3, free  Result Value Ref Range   T3, Free 2.1 2.0 - 4.4 pg/mL  T4, free  Result Value Ref Range   Free T4 0.88 0.61 - 1.12 ng/dL  Digoxin level  Result Value Ref Range   Digoxin Level  0.9 0.8 - 2.0 ng/mL      Assessment & Plan:   Problem List Items Addressed This Visit       Endocrine   Type 2 diabetes mellitus with peripheral neuropathy (HCC)    Chronic. Patient showed how to put continuous glucose monitor on her skin.  Reviewed how to use receiver during visit.  Keep regularly scheduled follow up.        Follow up plan: Return for Keep scheduled follow up.

## 2022-08-21 NOTE — Assessment & Plan Note (Signed)
Chronic. Patient showed how to put continuous glucose monitor on her skin.  Reviewed how to use receiver during visit.  Keep regularly scheduled follow up.

## 2022-08-22 ENCOUNTER — Ambulatory Visit: Payer: Medicare Other

## 2022-08-22 ENCOUNTER — Encounter (HOSPITAL_COMMUNITY): Payer: Self-pay

## 2022-08-22 ENCOUNTER — Ambulatory Visit (HOSPITAL_COMMUNITY)
Admission: RE | Admit: 2022-08-22 | Discharge: 2022-08-22 | Disposition: A | Discharge status: Home / Self Care | Payer: Medicare Other | Source: Ambulatory Visit | Attending: Family Medicine | Admitting: Family Medicine

## 2022-08-22 VITALS — BP 118/72 | HR 87 | Wt 208.0 lb

## 2022-08-22 DIAGNOSIS — Z79899 Other long term (current) drug therapy: Secondary | ICD-10-CM | POA: Diagnosis not present

## 2022-08-22 DIAGNOSIS — Z7982 Long term (current) use of aspirin: Secondary | ICD-10-CM | POA: Diagnosis not present

## 2022-08-22 DIAGNOSIS — I2510 Atherosclerotic heart disease of native coronary artery without angina pectoris: Secondary | ICD-10-CM | POA: Insufficient documentation

## 2022-08-22 DIAGNOSIS — I5022 Chronic systolic (congestive) heart failure: Secondary | ICD-10-CM | POA: Diagnosis not present

## 2022-08-22 DIAGNOSIS — I428 Other cardiomyopathies: Secondary | ICD-10-CM | POA: Insufficient documentation

## 2022-08-22 DIAGNOSIS — Z87891 Personal history of nicotine dependence: Secondary | ICD-10-CM | POA: Insufficient documentation

## 2022-08-22 DIAGNOSIS — I493 Ventricular premature depolarization: Secondary | ICD-10-CM

## 2022-08-22 DIAGNOSIS — J449 Chronic obstructive pulmonary disease, unspecified: Secondary | ICD-10-CM | POA: Diagnosis not present

## 2022-08-22 DIAGNOSIS — E119 Type 2 diabetes mellitus without complications: Secondary | ICD-10-CM | POA: Diagnosis not present

## 2022-08-22 DIAGNOSIS — R7989 Other specified abnormal findings of blood chemistry: Secondary | ICD-10-CM | POA: Diagnosis not present

## 2022-08-22 LAB — BASIC METABOLIC PANEL
Anion gap: 14 (ref 5–15)
BUN: 21 mg/dL (ref 8–23)
CO2: 23 mmol/L (ref 22–32)
Calcium: 9.4 mg/dL (ref 8.9–10.3)
Chloride: 102 mmol/L (ref 98–111)
Creatinine, Ser: 0.86 mg/dL (ref 0.44–1.00)
GFR, Estimated: >60 mL/min (ref >60)
Glucose, Bld: 223 mg/dL — ABNORMAL HIGH (ref 70–99)
Potassium: 4 mmol/L (ref 3.5–5.1)
Sodium: 139 mmol/L (ref 135–145)

## 2022-08-22 LAB — BRAIN NATRIURETIC PEPTIDE: B Natriuretic Peptide: 19.8 pg/mL (ref 0.0–100.0)

## 2022-08-22 LAB — DIGOXIN LEVEL: Digoxin Level: 1 ng/mL (ref 0.8–2.0)

## 2022-08-22 MED ORDER — NITROGLYCERIN 0.3 MG SL SUBL
0.3000 mg | SUBLINGUAL_TABLET | SUBLINGUAL | 3 refills | Status: DC | PRN
Start: 2022-08-22 — End: 2024-09-01

## 2022-08-22 MED ORDER — AMIODARONE HCL 100 MG PO TABS: 100.0000 mg | ORAL_TABLET | Freq: Every day | ORAL | 3 refills | Status: DC

## 2022-08-22 NOTE — Progress Notes (Signed)
Patient presented to the office today to have Dexcom set up. Successfully set up patient's sensor, transmitter, and receiver. Instructed patient on how to change and re-apply new sensor. Patient verbalized understanding.

## 2022-08-22 NOTE — Patient Instructions (Addendum)
Thank you for coming in today  Labs were done today, if any labs are abnormal the clinic will call you No news is good news   Your physician recommends that you schedule a follow-up appointment in:  4 months with Dr. Aundra Dubin    Do the following things EVERYDAY: Weigh yourself in the morning before breakfast. Write it down and keep it in a log. Take your medicines as prescribed Eat low salt foods--Limit salt (sodium) to 2000 mg per day.  Stay as active as you can everyday Limit all fluids for the day to less than 2 liters  At the [facility], you and your health needs are our priority. As part of our continuing mission to provide you with exceptional heart care, we have created designated Provider Care Teams. These Care Teams include your primary Cardiologist (physician) and Advanced Practice Providers (APPs- Physician Assistants and Nurse Practitioners) who all work together to provide you with the care you need, when you need it.   You may see any of the following providers on your designated Care Team at your next follow up: Dr Glori Bickers Dr Loralie Champagne Dr. Roxana Hires, NP Lyda Jester, [place] [facility] [place], [place] Forestine Na, NP Audry Riles, PharmD   Please be sure to bring in all your medications bottles to every appointment.   If you have any questions or concerns before your next appointment please send [place] a message through [place] or call our office at [phone].    TO LEAVE A MESSAGE FOR THE NURSE SELECT OPTION 2, PLEASE LEAVE A MESSAGE INCLUDING: YOUR NAME DATE OF BIRTH CALL BACK NUMBER REASON FOR CALL**this is important as we prioritize the call backs  YOU WILL RECEIVE A CALL BACK THE SAME DAY AS LONG AS YOU CALL BEFORE 4:00 PM

## 2022-08-23 ENCOUNTER — Telehealth (HOSPITAL_COMMUNITY): Payer: Self-pay | Admitting: Cardiology

## 2022-08-23 ENCOUNTER — Other Ambulatory Visit (HOSPITAL_COMMUNITY): Payer: Self-pay | Admitting: Cardiology

## 2022-08-23 DIAGNOSIS — I5022 Chronic systolic (congestive) heart failure: Secondary | ICD-10-CM

## 2022-08-23 NOTE — Telephone Encounter (Signed)
-----   Message from Rafael Bihari, Nora sent at 08/22/2022  4:24 PM EDT ----- Labs ok.  Dig elevated. Repeat as a trough please and make sure she does not take before her appt.

## 2022-08-23 NOTE — Telephone Encounter (Signed)
Pt aware order placed for [facility] [place]

## 2022-08-26 ENCOUNTER — Other Ambulatory Visit: Payer: Self-pay | Admitting: Nurse Practitioner

## 2022-08-27 NOTE — Telephone Encounter (Signed)
Requested medication (s) are due for refill today: no  Requested medication (s) are on the active medication list: yes  Last refill:  04/13/22 #90 1 RF  Future visit scheduled: yes  Notes to clinic:  med not delegated to NT to reordered   Requested Prescriptions  Pending Prescriptions Disp Refills   QUEtiapine (SEROQUEL) 200 MG tablet [Pharmacy Med Name: QUETIAPINE  200MG  TAB] 90 tablet 3    Sig: TAKE 1 TABLET BY MOUTH DAILY AT  BEDTIME     Not Delegated - Psychiatry:  Antipsychotics - Second Generation (Atypical) - quetiapine Failed - 08/26/2022 11:37 PM      Failed - This refill cannot be delegated      Failed - TSH in normal range and within 360 days    TSH  Date Value Ref Range Status  06/22/2022 8.726 (H) 0.350 - 4.500 uIU/mL Final    Comment:    Performed by a 3rd Generation assay with a functional sensitivity of <=0.01 uIU/mL. Performed at [facility], [place] [address] [place], [place] [address]   11/24/2021 5.370 (H) 0.450 - 4.500 uIU/mL Final         Failed - Lipid Panel in normal range within the last 12 months    Cholesterol, Total  Date Value Ref Range Status  05/31/2022 150 100 - 199 mg/dL Final   LDL Cholesterol (Calc)  Date Value Ref Range Status  01/01/2018 29 mg/dL (calc) Final    Comment:    Reference range: <100 . Desirable range <100 mg/dL for primary prevention;   <70 mg/dL for patients with CHD or diabetic patients  with > or = 2 CHD risk factors. Marland Kitchen LDL-C is now calculated using the Martin-Hopkins  calculation, which is a validated novel method providing  better accuracy than the Friedewald equation in the  estimation of LDL-C.  Cresenciano Genre et al. Annamaria Helling. 8657;846(96): 2061-2068  (http://education.[facility].com/faq/FAQ164)    LDL Chol Calc (NIH)  Date Value Ref Range Status  05/31/2022 74 0 - 99 mg/dL Final   HDL  Date Value Ref Range Status  05/31/2022 33 (L) >39 mg/dL Final   Triglycerides  Date Value Ref Range Status   05/31/2022 261 (H) 0 - 149 mg/dL Final         Passed - Completed PHQ-2 or PHQ-9 in the last 360 days      Passed - Last BP in normal range    BP Readings from Last 1 Encounters:  08/22/22 118/72         Passed - Last Heart Rate in normal range    Pulse Readings from Last 1 Encounters:  08/22/22 87         Passed - Valid encounter within last 6 months    Recent Outpatient Visits           6 days ago Type 2 diabetes mellitus with peripheral neuropathy ([place])   [facility] [name], NP   1 week ago Type 2 diabetes mellitus with peripheral neuropathy ([place])   [facility] [name], NP   1 month ago Type 2 diabetes mellitus with peripheral neuropathy ([place])   [facility] [name], NP   2 months ago Chronic systolic heart failure [facility])   [facility] [name], NP   3 months ago Type 2 diabetes mellitus with peripheral neuropathy ([place])   [facility] [name], NP       Future Appointments  In 3 weeks [name], NP [facility], [facility]            Passed - CBC within normal limits and completed in the last 12 months    WBC  Date Value Ref Range Status  11/24/2021 6.1 3.4 - 10.8 x10E3/uL Final  05/22/2021 8.5 4.0 - 10.5 K/uL Final   RBC  Date Value Ref Range Status  11/24/2021 5.40 (H) 3.77 - 5.28 x10E6/uL Final  05/22/2021 5.72 (H) 3.87 - 5.11 MIL/uL Final   Hemoglobin  Date Value Ref Range Status  11/24/2021 15.2 11.1 - 15.9 g/dL Final   Hematocrit  Date Value Ref Range Status  11/24/2021 48.6 (H) 34.0 - 46.6 % Final   MCHC  Date Value Ref Range Status  11/24/2021 31.3 (L) 31.5 - 35.7 g/dL Final  05/22/2021 30.7 30.0 - 36.0 g/dL Final   [facility]  Date Value Ref Range Status  11/24/2021 28.1 26.6 - 33.0 pg Final  05/22/2021 26.4 26.0 - 34.0 pg Final   MCV  Date Value Ref Range Status  11/24/2021 90 79 - 97 fL Final   02/24/2013 95 80 - 100 fL Final   No results found for: "PLTCOUNTKUC", "LABPLAT", "POCPLA" RDW  Date Value Ref Range Status  11/24/2021 14.4 11.7 - 15.4 % Final  02/24/2013 13.0 11.5 - 14.5 % Final         Passed - CMP within normal limits and completed in the last 12 months    Albumin  Date Value Ref Range Status  06/22/2022 4.1 3.5 - 5.0 g/dL Final  05/31/2022 4.9 3.9 - 4.9 g/dL Final  02/24/2013 3.6 3.4 - 5.0 g/dL Final   Alkaline Phosphatase  Date Value Ref Range Status  06/22/2022 185 (H) 38 - 126 U/L Final  02/24/2013 113 50 - 136 Unit/L Final   Alkaline phosphatase (APISO)  Date Value Ref Range Status  01/01/2018 92 33 - 130 U/L Final   ALT  Date Value Ref Range Status  06/22/2022 122 (H) 0 - 44 U/L Final   SGPT (ALT)  Date Value Ref Range Status  02/24/2013 94 (H) 12 - 78 U/L Final   AST  Date Value Ref Range Status  06/22/2022 131 (H) 15 - 41 U/L Final   SGOT(AST)  Date Value Ref Range Status  02/24/2013 58 (H) 15 - 37 Unit/L Final   BUN  Date Value Ref Range Status  08/22/2022 21 8 - 23 mg/dL Final  05/31/2022 18 8 - 27 mg/dL Final  02/24/2013 11 7 - 18 mg/dL Final   Calcium  Date Value Ref Range Status  08/22/2022 9.4 8.9 - 10.3 mg/dL Final   Calcium, Total  Date Value Ref Range Status  02/24/2013 8.6 8.5 - 10.1 mg/dL Final   CO2  Date Value Ref Range Status  08/22/2022 23 22 - 32 mmol/L Final   Co2  Date Value Ref Range Status  02/24/2013 26 21 - 32 mmol/L Final   Bicarbonate  Date Value Ref Range Status  01/06/2018 24.3 20.0 - 28.0 mmol/L Final   Creat  Date Value Ref Range Status  01/01/2018 0.82 0.50 - 0.99 mg/dL Final    Comment:    For patients >66 years of age, the reference limit for Creatinine is approximately 13% higher for people identified as African-American. .    Creatinine, Ser  Date Value Ref Range Status  08/22/2022 0.86 0.44 - 1.00 mg/dL Final   Glucose  Date Value Ref Range Status  02/24/2013 87  65 -  99 mg/dL Final   Glucose, Bld  Date Value Ref Range Status  08/22/2022 223 (H) 70 - 99 mg/dL Final    Comment:    Glucose reference range applies only to samples taken after fasting for at least 8 hours.   Glucose-Capillary  Date Value Ref Range Status  01/04/2022 327 (H) 70 - 99 mg/dL Final    Comment:    Glucose reference range applies only to samples taken after fasting for at least 8 hours.   Potassium  Date Value Ref Range Status  08/22/2022 4.0 3.5 - 5.1 mmol/L Final  02/24/2013 4.1 3.5 - 5.1 mmol/L Final   Sodium  Date Value Ref Range Status  08/22/2022 139 135 - 145 mmol/L Final  05/31/2022 142 134 - 144 mmol/L Final  02/24/2013 143 136 - 145 mmol/L Final   Total Bilirubin  Date Value Ref Range Status  06/22/2022 1.1 0.3 - 1.2 mg/dL Final   Bilirubin,Total  Date Value Ref Range Status  02/24/2013 0.4 0.2 - 1.0 mg/dL Final   Bilirubin Total  Date Value Ref Range Status  05/31/2022 0.4 0.0 - 1.2 mg/dL Final   Bilirubin, Direct  Date Value Ref Range Status  01/30/2022 0.4 (H) 0.0 - 0.2 mg/dL Final   Indirect Bilirubin  Date Value Ref Range Status  01/30/2022 0.6 0.3 - 0.9 mg/dL Final    Comment:    Performed at [facility], [address] [place], [place] [address]   Protein, ur  Date Value Ref Range Status  01/04/2018 NEGATIVE NEGATIVE mg/dL Final   Protein,UA  Date Value Ref Range Status  09/20/2021 Negative Negative/Trace Final   Total Protein  Date Value Ref Range Status  06/22/2022 8.4 (H) 6.5 - 8.1 g/dL Final  05/31/2022 7.2 6.0 - 8.5 g/dL Final  02/24/2013 7.2 6.4 - 8.2 g/dL Final   GFR, Est African American  Date Value Ref Range Status  01/01/2018 88 > OR = 60 mL/min/1.77m Final   GFR calc Af Amer  Date Value Ref Range Status  10/17/2020 79 >59 mL/min/1.73 Final    Comment:    **In accordance with recommendations from the NKF-ASN Task force,**   [facility] is in the process of updating its eGFR calculation to the   2021  CKD-EPI creatinine equation that estimates kidney function   without a race variable.    eGFR  Date Value Ref Range Status  05/31/2022 67 >59 mL/min/1.73 Final   GFR, Est Non African American  Date Value Ref Range Status  01/01/2018 76 > OR = 60 mL/min/1.790mFinal   GFR, Estimated  Date Value Ref Range Status  08/22/2022 >60 >60 mL/min Final    Comment:    (NOTE) Calculated using the CKD-EPI Creatinine Equation (2021)

## 2022-08-29 ENCOUNTER — Telehealth: Payer: Self-pay | Admitting: Nurse Practitioner

## 2022-08-29 NOTE — Telephone Encounter (Signed)
PT brought in paperwork for [place] Patient Assistance Program Application to be filled out by provider.  Placed in provider's folder.

## 2022-08-30 ENCOUNTER — Other Ambulatory Visit (HOSPITAL_COMMUNITY): Payer: Self-pay

## 2022-08-30 MED ORDER — AMIODARONE HCL 100 MG PO TABS: 100.0000 mg | ORAL_TABLET | Freq: Every day | ORAL | 3 refills | Status: DC

## 2022-08-30 NOTE — Telephone Encounter (Signed)
In your sig folder

## 2022-08-30 NOTE — Telephone Encounter (Signed)
Paperwork placed in [place]. Folder.

## 2022-08-30 NOTE — Telephone Encounter (Signed)
Form completed.

## 2022-08-31 ENCOUNTER — Ambulatory Visit: Payer: Medicare Other | Admitting: Nurse Practitioner

## 2022-08-31 ENCOUNTER — Other Ambulatory Visit
Admission: RE | Admit: 2022-08-31 | Discharge: 2022-08-31 | Disposition: A | Discharge status: Home / Self Care | Payer: Medicare Other | Attending: Cardiology | Admitting: Cardiology

## 2022-08-31 ENCOUNTER — Other Ambulatory Visit (HOSPITAL_COMMUNITY): Payer: Self-pay | Admitting: *Deleted

## 2022-08-31 DIAGNOSIS — I5022 Chronic systolic (congestive) heart failure: Secondary | ICD-10-CM | POA: Insufficient documentation

## 2022-08-31 LAB — BASIC METABOLIC PANEL
Anion gap: 9 (ref 5–15)
BUN: 25 mg/dL — ABNORMAL HIGH (ref 8–23)
CO2: 27 mmol/L (ref 22–32)
Calcium: 8.6 mg/dL — ABNORMAL LOW (ref 8.9–10.3)
Chloride: 104 mmol/L (ref 98–111)
Creatinine, Ser: 1.03 mg/dL — ABNORMAL HIGH (ref 0.44–1.00)
GFR, Estimated: 59 mL/min — ABNORMAL LOW (ref >60)
Glucose, Bld: 181 mg/dL — ABNORMAL HIGH (ref 70–99)
Potassium: 4.5 mmol/L (ref 3.5–5.1)
Sodium: 140 mmol/L (ref 135–145)

## 2022-08-31 MED ORDER — AMIODARONE HCL 200 MG PO TABS: 100.0000 mg | ORAL_TABLET | Freq: Every day | ORAL | 3 refills | Status: DC

## 2022-09-04 ENCOUNTER — Ambulatory Visit: Payer: Medicare Other | Admitting: Nurse Practitioner

## 2022-09-17 ENCOUNTER — Ambulatory Visit (INDEPENDENT_AMBULATORY_CARE_PROVIDER_SITE_OTHER): Payer: Medicare Other | Admitting: Nurse Practitioner

## 2022-09-17 ENCOUNTER — Other Ambulatory Visit: Payer: Self-pay

## 2022-09-17 ENCOUNTER — Encounter: Payer: Self-pay | Admitting: Nurse Practitioner

## 2022-09-17 VITALS — BP 96/57 | HR 83 | Temp 98.4°F | Wt 209.6 lb

## 2022-09-17 DIAGNOSIS — I5020 Unspecified systolic (congestive) heart failure: Secondary | ICD-10-CM

## 2022-09-17 DIAGNOSIS — I5022 Chronic systolic (congestive) heart failure: Secondary | ICD-10-CM | POA: Diagnosis not present

## 2022-09-17 DIAGNOSIS — I152 Hypertension secondary to endocrine disorders: Secondary | ICD-10-CM

## 2022-09-17 DIAGNOSIS — F331 Major depressive disorder, recurrent, moderate: Secondary | ICD-10-CM

## 2022-09-17 DIAGNOSIS — G8929 Other chronic pain: Secondary | ICD-10-CM

## 2022-09-17 DIAGNOSIS — E1169 Type 2 diabetes mellitus with other specified complication: Secondary | ICD-10-CM | POA: Diagnosis not present

## 2022-09-17 DIAGNOSIS — E785 Hyperlipidemia, unspecified: Secondary | ICD-10-CM | POA: Diagnosis not present

## 2022-09-17 DIAGNOSIS — E1142 Type 2 diabetes mellitus with diabetic polyneuropathy: Secondary | ICD-10-CM | POA: Diagnosis not present

## 2022-09-17 DIAGNOSIS — I25118 Atherosclerotic heart disease of native coronary artery with other forms of angina pectoris: Secondary | ICD-10-CM

## 2022-09-17 DIAGNOSIS — E1159 Type 2 diabetes mellitus with other circulatory complications: Secondary | ICD-10-CM | POA: Diagnosis not present

## 2022-09-17 DIAGNOSIS — I700 Atherosclerosis of aorta: Secondary | ICD-10-CM

## 2022-09-17 DIAGNOSIS — M545 Low back pain, unspecified: Secondary | ICD-10-CM

## 2022-09-17 MED ORDER — INSULIN LISPRO (1 UNIT DIAL) 100 UNIT/ML (KWIKPEN): 5.0000 [IU] | PEN_INJECTOR | Freq: Three times a day (TID) | SUBCUTANEOUS | 11 refills | Status: DC

## 2022-09-17 MED ORDER — INSULIN DETEMIR 100 UNIT/ML ~~LOC~~ SOLN: 80.0000 [IU] | Freq: Every day | SUBCUTANEOUS | 11 refills | Status: DC

## 2022-09-17 MED ORDER — TRULICITY 4.5 MG/0.5ML ~~LOC~~ SOAJ: 4.5000 mg | SUBCUTANEOUS | 1 refills | Status: DC

## 2022-09-17 NOTE — Assessment & Plan Note (Signed)
Chronic.  Controlled.  Continue with current medication regimen.  Labs ordered today.  Return to clinic in 3 months for reevaluation.  Call sooner if concerns arise.   

## 2022-09-17 NOTE — Telephone Encounter (Signed)
Prescriptions printed

## 2022-09-17 NOTE — Assessment & Plan Note (Signed)
Recommended eating smaller high protein, low fat meals more frequently and exercising 30 mins a day 5 times a week with a goal of 10-15lb weight loss in the next 3 months. Patient voiced their understanding and motivation to adhere to these recommendations.  

## 2022-09-17 NOTE — Assessment & Plan Note (Signed)
Will obtain xray for evaluation.  May need physical therapy.

## 2022-09-17 NOTE — Assessment & Plan Note (Signed)
Chronic. Ongoing.  Patient restarted medications after seeing HF clinic. See[facility] in [place].  Has HF clinic appt next month.  Continues to follow up with Cardiology.  Continue per their recommendations.  Continue with ASA and Atorvastatin. - Reminded to call for an overnight weight gain of >2 pounds or a weekly weight gain of >5 pounds - not adding salt to food and read food labels. Reviewed the importance of keeping daily sodium intake to '2000mg'$  daily. - Avoid Ibuprofen products.

## 2022-09-17 NOTE — Assessment & Plan Note (Signed)
Chronic.  Controlled.  Continue with current medication regimen on Atorvastatin '10mg'$ .  Labs ordered today.  Return to clinic in 3 months for reevaluation.  Call sooner if concerns arise.

## 2022-09-17 NOTE — Assessment & Plan Note (Signed)
Chronic. Ongoing.  Patient restarted medications after seeing HF clinic. See[facility] in [place].  Has HF clinic appt next month.  Continues to follow up with Cardiology.  Continue per their recommendations.  Continue with ASA and Atorvastatin. - Reminded to call for an overnight weight gain of >2 pounds or a weekly weight gain of >5 pounds - not adding salt to food and read food labels. Reviewed the importance of keeping daily sodium intake to '2000mg'$  daily. - Avoid Ibuprofen products.

## 2022-09-17 NOTE — Progress Notes (Signed)
BP (!) 96/57   Pulse 83   Temp 98.4 F (36.9 C) (Oral)   Wt 209 lb 9.6 oz (95.1 kg)   SpO2 94%   BMI 38.34 kg/m    Subjective:    Patient ID: Kim Buchanan, female    DOB: 09-17-1951, 71 y.o.   MRN: [id number]  HPI: Kim Buchanan is a 71 y.o. female  Chief Complaint  Patient presents with   Diabetes Management Plan    HYPERTENSION / HYPERLIPIDEMIA Satisfied with current treatment? yes Duration of hypertension: years BP monitoring frequency: a few times a month BP range: 120/80 BP medication side effects: no Past BP meds:  metoprolol and valsartan Duration of hyperlipidemia: years Cholesterol medication side effects: no Cholesterol supplements: none Past cholesterol medications: atorvastain (lipitor) Medication compliance: excellent compliance Aspirin: yes Recent stressors: no Recurrent headaches: no Visual changes: no Palpitations: no Dyspnea: SOB Chest pain: no Lower extremity edema: no Dizzy/lightheaded: no  DIABETES Hypoglycemic episodes:no Polydipsia/polyuria: no Visual disturbance: no Chest pain: no Paresthesias: no Glucose Monitoring: yes  Accucheck frequency: in the morning  Fasting glucose:   Post prandial:  Evening:  Before meals: Taking Insulin?: yes  Long acting insulin: Has gotten up to 80u  Short acting insulin: Humalog 5 TID Blood Pressure Monitoring: a few times a month Retinal Examination: Up to Date Foot Exam: Up to Date Diabetic Education: Not Completed Pneumovax: Up to Date Influenza: Up to Date Aspirin: yes  Patient states she has been having lower back pain.  States it feels like it "catches".  Has been going on for a couple of weeks. Not able to do much around her house due to the back pain.  Relevant past medical, surgical, family and social history reviewed and updated as indicated. Interim medical history since our last visit reviewed. Allergies and medications reviewed and updated.  Review of Systems  Eyes:   Negative for visual disturbance.  Respiratory:  Positive for shortness of breath. Negative for cough and chest tightness.   Cardiovascular:  Negative for chest pain, palpitations and leg swelling.  Musculoskeletal:  Positive for back pain.  Neurological:  Negative for dizziness and headaches.  Psychiatric/Behavioral:  Positive for sleep disturbance.     Per HPI unless specifically indicated above     Objective:    BP (!) 96/57   Pulse 83   Temp 98.4 F (36.9 C) (Oral)   Wt 209 lb 9.6 oz (95.1 kg)   SpO2 94%   BMI 38.34 kg/m   Wt Readings from Last 3 Encounters:  09/17/22 209 lb 9.6 oz (95.1 kg)  08/22/22 208 lb (94.3 kg)  08/21/22 210 lb 11.2 oz (95.6 kg)    Physical Exam Vitals and nursing note reviewed.  Constitutional:      General: She is not in acute distress.    Appearance: Normal appearance. She is obese. She is not ill-appearing, toxic-appearing or diaphoretic.  HENT:     Head: Normocephalic.     Right Ear: External ear normal.     Left Ear: External ear normal.     Nose: Nose normal.     Mouth/Throat:     Mouth: Mucous membranes are moist.     Pharynx: Oropharynx is clear.  Eyes:     General:        Right eye: No discharge.        Left eye: No discharge.     Extraocular Movements: Extraocular movements intact.     Conjunctiva/sclera: Conjunctivae normal.  Pupils: Pupils are equal, round, and reactive to light.  Cardiovascular:     Rate and Rhythm: Normal rate and regular rhythm.     Heart sounds: No murmur heard. Pulmonary:     Effort: Pulmonary effort is normal. No respiratory distress.     Breath sounds: Normal breath sounds. No wheezing or rales.  Musculoskeletal:     Cervical back: Normal range of motion and neck supple.  Skin:    General: Skin is warm and dry.     Capillary Refill: Capillary refill takes less than 2 seconds.  Neurological:     General: No focal deficit present.     Mental Status: She is alert and oriented to person, place,  and time. Mental status is at baseline.  Psychiatric:        Mood and Affect: Mood normal.        Behavior: Behavior normal.        Thought Content: Thought content normal.        Judgment: Judgment normal.     Results for orders placed or performed during the hospital encounter of 03/55/97  Basic Metabolic Panel (BMET)  Result Value Ref Range   Sodium 140 135 - 145 mmol/L   Potassium 4.5 3.5 - 5.1 mmol/L   Chloride 104 98 - 111 mmol/L   CO2 27 22 - 32 mmol/L   Glucose, Bld 181 (H) 70 - 99 mg/dL   BUN 25 (H) 8 - 23 mg/dL   Creatinine, Ser 1.03 (H) 0.44 - 1.00 mg/dL   Calcium 8.6 (L) 8.9 - 10.3 mg/dL   GFR, Estimated 59 (L) >60 mL/min   Anion gap 9 5 - 15      Assessment & Plan:   Problem List Items Addressed This Visit       Cardiovascular and Mediastinum   Chronic systolic heart failure (HCC) (Chronic)    Chronic. Ongoing.  Patient restarted medications after seeing HF clinic. See[facility] in [place].  Has HF clinic appt next month.  Continues to follow up with Cardiology.  Continue per their recommendations.  Continue with ASA and Atorvastatin. - Reminded to call for an overnight weight gain of >2 pounds or a weekly weight gain of >5 pounds - not adding salt to food and read food labels. Reviewed the importance of keeping daily sodium intake to <2039m daily. - Avoid Ibuprofen products.       Hypertension associated with diabetes ([place]    Chronic.  Controlled.  Continue with current medication regimen.  Labs ordered today.  Return to clinic in 3 months for reevaluation.  Call sooner if concerns arise.        Relevant Orders   Comp Met (CMET)   CAD (coronary artery disease), native coronary artery    Chronic. Ongoing.  Continues to follow up with Cardiology and HF clinic.  Continue per their recommendations.  Continue with ASA and Atorvastatin.      Aortic atherosclerosis ([place]    Observed on CT in 2021.  Continue with ASA and Atorvastatin.         HFrEF (heart failure with reduced ejection fraction) (HCC)    Chronic. Ongoing.  Patient restarted medications after seeing HF clinic. [place] [facility] [place]  Has HF clinic appt next month.  Continues to follow up with Cardiology.  Continue per their recommendations.  Continue with ASA and Atorvastatin. - Reminded to call for an overnight weight gain of >2 pounds or a weekly weight gain of >5  pounds - not adding salt to food and read food labels. Reviewed the importance of keeping daily sodium intake to <2039m daily. - Avoid Ibuprofen products.         Endocrine   Hyperlipidemia associated with type 2 diabetes mellitus (HCC)    Chronic.  Controlled.  Continue with current medication regimen on Atorvastatin 162m  Labs ordered today.  Return to clinic in 3 months for reevaluation.  Call sooner if concerns arise.        Type 2 diabetes mellitus with peripheral neuropathy (HCC)    Chronic. Not well controlled.  Last A1c was 13.8 in August.  On Lantus 80u nightly and Humalog 37m38mID.  She is also taking Glimeride and Metformin.  Labs ordered today.  Sugars have been running <200.  Follow up in 3 months.  Call sooner if concerns arise.      Relevant Orders   HgB A1c   Microalbumin, Urine Waived     Other   Low back pain    Will obtain xray for evaluation.  May need physical therapy.      Relevant Orders   DG Lumbar Spine Complete   Morbid obesity ([place]  Recommended eating smaller high protein, low fat meals more frequently and exercising 30 mins a day 5 times a week with a goal of 10-15lb weight loss in the next 3 months. Patient voiced their understanding and motivation to adhere to these recommendations.       Moderate episode of recurrent major depressive disorder (HCC)    Chronic.  Controlled.  Continue with current medication regimen.  Labs ordered today.  Return to clinic in 3 months for reevaluation.  Call sooner if concerns arise.          Follow up  plan: Return in about 3 months (around 12/18/2022) for HTN, HLD, DM2 FU.

## 2022-09-17 NOTE — Assessment & Plan Note (Signed)
Chronic. Not well controlled.  Last A1c was 13.8 in August.  On Lantus 80u nightly and Humalog '5mg'$  TID.  She is also taking Glimeride and Metformin.  Labs ordered today.  Sugars have been running <200.  Follow up in 3 months.  Call sooner if concerns arise.

## 2022-09-17 NOTE — Assessment & Plan Note (Signed)
Chronic. Ongoing.  Continues to follow up with Cardiology and HF clinic.  Continue per their recommendations.  Continue with ASA and Atorvastatin.

## 2022-09-17 NOTE — Assessment & Plan Note (Signed)
Observed on CT in 2021.  Continue with ASA and Atorvastatin.

## 2022-09-18 ENCOUNTER — Other Ambulatory Visit: Payer: Self-pay

## 2022-09-18 DIAGNOSIS — E1142 Type 2 diabetes mellitus with diabetic polyneuropathy: Secondary | ICD-10-CM

## 2022-09-18 LAB — COMPREHENSIVE METABOLIC PANEL
ALT: 105 IU/L — ABNORMAL HIGH (ref 0–32)
AST: 121 IU/L — ABNORMAL HIGH (ref 0–40)
Albumin/Globulin Ratio: 1.5 (ref 1.2–2.2)
Albumin: 4.7 g/dL (ref 3.9–4.9)
Alkaline Phosphatase: 202 IU/L — ABNORMAL HIGH (ref 44–121)
BUN/Creatinine Ratio: 24 (ref 12–28)
BUN: 25 mg/dL (ref 8–27)
Bilirubin Total: 0.5 mg/dL (ref 0.0–1.2)
CO2: 22 mmol/L (ref 20–29)
Calcium: 9.8 mg/dL (ref 8.7–10.3)
Chloride: 100 mmol/L (ref 96–106)
Creatinine, Ser: 1.04 mg/dL — ABNORMAL HIGH (ref 0.57–1.00)
Globulin, Total: 3.1 g/dL (ref 1.5–4.5)
Glucose: 141 mg/dL — ABNORMAL HIGH (ref 70–99)
Potassium: 4.3 mmol/L (ref 3.5–5.2)
Sodium: 144 mmol/L (ref 134–144)
Total Protein: 7.8 g/dL (ref 6.0–8.5)
eGFR: 58 mL/min/{1.73_m2} — ABNORMAL LOW (ref >59)

## 2022-09-18 LAB — HEMOGLOBIN A1C
Est. average glucose Bld gHb Est-mCnc: 186 mg/dL
Hgb A1c MFr Bld: 8.1 % — ABNORMAL HIGH (ref 4.8–5.6)

## 2022-09-18 LAB — MICROALBUMIN, URINE WAIVED
Creatinine, Urine Waived: 50 mg/dL (ref 10–300)
Microalb, Ur Waived: 10 mg/L (ref 0–19)
Microalb/Creat Ratio: <30 mg/g (ref <30)

## 2022-09-18 MED ORDER — FREESTYLE LIBRE 2 SENSOR MISC: 1.0000 | 1 refills | Status: DC

## 2022-09-18 MED ORDER — FREESTYLE LIBRE 2 READER DEVI: 1.0000 | Freq: Every day | 0 refills | Status: DC

## 2022-09-18 NOTE — Progress Notes (Signed)
Please let patient know that her A1c improved from 13.8 to 8.1.  Tell her I am so proud and to keep up the good work.  Her kidney function remains stable.  Microalbumin was normal.  I will see her at our next visit.

## 2022-09-20 ENCOUNTER — Ambulatory Visit
Admission: RE | Admit: 2022-09-20 | Discharge: 2022-09-20 | Disposition: A | Discharge status: Home / Self Care | Payer: Medicare Other | Attending: Nurse Practitioner | Admitting: Nurse Practitioner

## 2022-09-20 ENCOUNTER — Ambulatory Visit
Admission: RE | Admit: 2022-09-20 | Discharge: 2022-09-20 | Disposition: A | Discharge status: Home / Self Care | Payer: Medicare Other | Source: Ambulatory Visit | Attending: Nurse Practitioner | Admitting: Nurse Practitioner

## 2022-09-20 DIAGNOSIS — M545 Low back pain, unspecified: Secondary | ICD-10-CM | POA: Diagnosis not present

## 2022-09-20 DIAGNOSIS — G8929 Other chronic pain: Secondary | ICD-10-CM | POA: Diagnosis not present

## 2022-09-21 ENCOUNTER — Other Ambulatory Visit (HOSPITAL_COMMUNITY): Payer: Self-pay | Admitting: Cardiology

## 2022-09-21 ENCOUNTER — Ambulatory Visit: Payer: Medicare Other | Admitting: Podiatry

## 2022-09-24 NOTE — Progress Notes (Signed)
Please let patient know that her xray shows she has arthritis in L4-L5.  I recommend she see orthopedics for further evaluation and management.  If she agrees, I will place the referral.

## 2022-09-24 NOTE — Progress Notes (Signed)
Referral placed.

## 2022-09-24 NOTE — Addendum Note (Signed)
Addended by: [name] on: 09/24/2022 10:48 AM   Modules accepted: Orders

## 2022-09-28 DIAGNOSIS — M545 Low back pain, unspecified: Secondary | ICD-10-CM | POA: Diagnosis not present

## 2022-10-01 ENCOUNTER — Ambulatory Visit (INDEPENDENT_AMBULATORY_CARE_PROVIDER_SITE_OTHER): Payer: Medicare Other

## 2022-10-01 DIAGNOSIS — I5020 Unspecified systolic (congestive) heart failure: Secondary | ICD-10-CM

## 2022-10-01 DIAGNOSIS — I5022 Chronic systolic (congestive) heart failure: Secondary | ICD-10-CM

## 2022-10-01 DIAGNOSIS — I152 Hypertension secondary to endocrine disorders: Secondary | ICD-10-CM

## 2022-10-01 NOTE — Patient Instructions (Signed)
Visit Information   Goals Addressed   None    Patient Care Plan: CCM Pharmacy Care Plan     Problem Identified: hld copd htn dm2 copd hfref      Long-Range Goal: disease management   Start Date: 03/26/2022  Recent Progress: On track  Note:   Current Barriers:  Unable to independently afford treatment regimen Unable to achieve control of DM   Pharmacist Clinical Goal(s):  Patient will verbalize ability to afford treatment regimen achieve adherence to monitoring guidelines and medication adherence to achieve therapeutic efficacy through collaboration with PharmD and provider.   Interventions: 1:1 collaboration with [name], NP regarding development and update of comprehensive plan of care as evidenced by provider attestation and co-signature Inter-disciplinary care team collaboration (see longitudinal plan of care) Comprehensive medication review performed; medication list updated in electronic medical record  Diabetes (A1c goal <8%) Lab Results  Component Value Date   HGBA1C 8.1 (H) 09/17/2022   HGBA1C 13.8 (H) 05/31/2022   HGBA1C 11.9 (H) 02/21/2022   Lab Results  Component Value Date   MICROALBUR 10 09/17/2022   LDLCALC 74 05/31/2022   CREATININE 1.04 (H) 09/17/2022   Lab Results  Component Value Date   NA 144 09/17/2022   K 4.3 09/17/2022   CREATININE 1.04 (H) 09/17/2022   EGFR 58 (L) 09/17/2022   GFRNONAA 59 (L) 08/31/2022   GLUCOSE 141 (H) 09/17/2022   Lab Results  Component Value Date   WBC 6.1 11/24/2021   HGB 15.2 11/24/2021   HCT 48.6 (H) 11/24/2021   MCV 90 11/24/2021   PLT 299 11/24/2021   Lab Results  Component Value Date   LABMICR See below: 08/29/2021   LABMICR See below: 03/22/2016   MICROALBUR 10 09/17/2022   MICROALBUR 10 08/25/2021  -Uncontrolled -Current medications: Trulicity 4.5 mg units Appropriate, query effective PAP Approved 2023 Basaglar 30 units into the skin daily, increase by 2 units daily until fasting blood  sugar less than 150  Appropriate, query effective PAP approved 2023 Glimaperide 51m QD Appropriate, Query effective,  -Medications previously tried: farxiga - yeast infxn,   -Current home glucose readings fasting glucose: August 2023: Only tests weekly but said was in 300's this morning December 2023: Patient not testing daily. Counseled to start -Denies hypoglycemic/hyperglycemic symptoms -Current meal patterns: salads, bananas, eggs, vegetables. Coffee, tea.  -Current exercise: limited -Educated on A1c and blood sugar goals; -Counseled to check feet daily and get yearly eye exams -Counseled on diet and exercise extensively August 2023: Patient doing 30 units currently. With weight of 90kg, MDD is 45 units long acting. This should happen in about 2 weeks. Will have concierge call to check in on patient. Asked PCP if we could start paperwork for Short acting insulin because patient can't afford and will most likely require it -Counseled on testing daily -Will have CCM team f/u every 2 weeks until sugars at goal December 2023: Sugars looking better. Will have concierge start calling again every 2 weeks for sugars. Patient stopped testing daily. -Patient told me she hasn't heard about PAP. Gave her the numbers to call and get filled  Heart Failure (Goal: manage symptoms and prevent exacerbations) -Controlled -Last ejection fraction: 35-40% (Date: 03/20/21) -HF type: Left Ventricular Failure -NYHA Class: II-III -AHA HF Stage: C (Heart disease and symptoms present) -Current treatment: Metoprolol Succ 555mQD Appropriate, effective, save, accessible Torsemide QD  Appropriate, effective, save, accessible Spironolactone 2528mD Appropriate, effective, save, accessible Digoxin 0.125 QD Appropriate, effective, save, accessible Amiodarone  122m QD Appropriate, effective, save, accessible -Medications previously tried: FIran- yeast infxn, entresto - cough  Counseled on appropriate bp  monitoring at home  December 2023: Patient's monitor broke. Counseled to get another -Current dietary habits: "Tries to eat healthy" -Current exercise habits: None -Educated on Benefits of medications for managing symptoms and prolonging life Proper diuretic administration and potassium supplementation Importance of blood pressure control -Counseled on diet and exercise extensively Recommended to continue current medication  Patient Goals/Self-Care Activities Patient will:  - take medications as prescribed as evidenced by patient report and record review collaborate with provider on medication access solutions   CPP F/U May 2024  NArizona Constable Pharm.D. -- [phone]     Ms. NKolasinskiwas given information about Chronic Care Management services today including:  CCM service includes personalized support from designated clinical staff supervised by her physician, including individualized plan of care and coordination with other care providers 24/7 contact phone numbers for assistance for urgent and routine care needs. Standard insurance, coinsurance, copays and deductibles apply for chronic care management only during months in which we provide at least 20 minutes of these services. Most insurances cover these services at 100%, however patients may be responsible for any copay, coinsurance and/or deductible if applicable. This service may help you avoid the need for more expensive face-to-face services. Only one practitioner may furnish and bill the service in a calendar month. The patient may stop CCM services at any time (effective at the end of the month) by phone call to the office staff.  Patient agreed to services and verbal consent obtained.   The patient verbalized understanding of instructions, educational materials, and care plan provided today and DECLINED offer to receive copy of patient instructions, educational materials, and care plan.  The pharmacy team will reach  out to the patient again over the next 60 days.   NLane Hacker RSharon

## 2022-10-01 NOTE — Progress Notes (Signed)
Chronic Care Management Pharmacy Note  10/01/2022 Name:  Kim Buchanan MRN:  [id number] DOB:  01-Feb-1951  Summary: Pleasant 71 year old female presents for f/u CCM visit. She loves to dance but doesn't go out that much anymore. Self describes as a Merchandiser, retail" and is very frustrated regarding her sugars  Recommendations to PCP: Patient approved for PAP but never called for refills. Told her to call ASAP Renew PAP 2024 Counseled patient to test sugars/BP daily  Subjective: Kim Buchanan is an 71 y.o. year old female who is a primary patient of [name], NP.  The CCM team was consulted for assistance with disease management and care coordination needs.    Engaged with patient by telephone for follow up visit in response to provider referral for pharmacy case management and/or care coordination services.   Consent to Services:  The patient was given information about Chronic Care Management services, agreed to services, and gave verbal consent prior to initiation of services.  Please see initial visit note for detailed documentation.   Patient Care Team: [name], NP as PCP - General (Nurse Practitioner) [name], MD as PCP - Cardiology (Cardiology) [name], MD as PCP - Electrophysiology (Cardiology) [name], OD as Consulting Physician (Optometry) [name], MD as Consulting Physician (Pulmonary Disease) Lane Hacker, [facility] (Pharmacist)  Recent office visits:  None noted   Recent consult visits:  None noted   Hospital visits:  None in previous 6 months  Objective:  Lab Results  Component Value Date   CREATININE 1.04 (H) 09/17/2022   CREATININE 1.03 (H) 08/31/2022   CREATININE 0.86 08/22/2022    Lab Results  Component Value Date   HGBA1C 8.1 (H) 09/17/2022   HGBA1C 13.8 (H) 05/31/2022   HGBA1C 11.9 (H) 02/21/2022   Last diabetic Eye exam:  Lab Results  Component Value Date/Time   HMDIABEYEEXA No  Retinopathy 02/08/2022 12:00 AM    Last diabetic Foot exam: No results found for: "HMDIABFOOTEX"      Component Value Date/Time   CHOL 150 05/31/2022 1002   CHOL 69 (L) 02/21/2022 0820   CHOL 84 (L) 11/24/2021 0920   TRIG 261 (H) 05/31/2022 1002   TRIG 183 (H) 02/21/2022 0820   TRIG 243 (H) 11/24/2021 0920   HDL 33 (L) 05/31/2022 1002   HDL 29 (L) 02/21/2022 0820   HDL 29 (L) 11/24/2021 0920   CHOLHDL 4.5 (H) 05/31/2022 1002   CHOLHDL 1.7 01/01/2018 0000   VLDL 29 08/06/2016 0824   LDLCALC 74 05/31/2022 1002   LDLCALC 11 02/21/2022 0820   LDLCALC 18 11/24/2021 0920   LDLCALC 29 01/01/2018 0000       Latest Ref Rng & Units 09/17/2022    3:45 PM 06/22/2022   11:21 AM 05/31/2022   10:02 AM  Hepatic Function  Total Protein 6.0 - 8.5 g/dL 7.8  8.4  7.2   Albumin 3.9 - 4.9 g/dL 4.7  4.1  4.9   AST 0 - 40 IU/L 121  131  33   ALT 0 - 32 IU/L 105  122  46   Alk Phosphatase 44 - 121 IU/L 202  185  68   Total Bilirubin 0.0 - 1.2 mg/dL 0.5  1.1  0.4     Lab Results  Component Value Date/Time   TSH 8.726 (H) 06/22/2022 11:21 AM   TSH 5.816 (H) 12/06/2021 10:41 AM   TSH 5.370 (H) 11/24/2021 09:20 AM   TSH 4.100  07/19/2021 01:52 PM   FREET4 0.88 06/22/2022 11:21 AM   FREET4 0.81 12/06/2021 10:42 AM       Latest Ref Rng & Units 11/24/2021    9:20 AM 06/13/2021    8:36 AM 05/22/2021    3:18 PM  CBC  WBC 3.4 - 10.8 x10E3/uL 6.1  6.9  8.5   Hemoglobin 11.1 - 15.9 g/dL 15.2  14.3  15.1   Hematocrit 34.0 - 46.6 % 48.6  44.8  49.2   Platelets 150 - 450 x10E3/uL 299  225  353     No results found for: "VD25OH"  Clinical ASCVD:  The 10-year ASCVD risk score (Arnett DK, et al., 2019) is: 20.2%   Values used to calculate the score:     Age: 43 years     Sex: Female     Is Non-Hispanic African American: No     Diabetic: Yes     Tobacco smoker: Yes     Systolic Blood Pressure: 96 mmHg     Is BP treated: Yes     HDL Cholesterol: 33 mg/dL     Total Cholesterol: 150 mg/dL   Social  History   Tobacco Use  Smoking Status Former   Packs/day: 1.50   Years: 50.00   Total pack years: 75.00   Types: Cigarettes   Quit date: 11/29/2018   Years since quitting: 3.8  Smokeless Tobacco Never  Tobacco Comments   started at age 40   BP Readings from Last 3 Encounters:  09/17/22 (!) 96/57  08/22/22 118/72  08/21/22 114/64   Pulse Readings from Last 3 Encounters:  09/17/22 83  08/22/22 87  08/21/22 78   Wt Readings from Last 3 Encounters:  09/17/22 209 lb 9.6 oz (95.1 kg)  08/22/22 208 lb (94.3 kg)  08/21/22 210 lb 11.2 oz (95.6 kg)   BMI Readings from Last 3 Encounters:  09/17/22 38.34 kg/m  08/22/22 38.04 kg/m  08/21/22 38.54 kg/m    Assessment: Review of patient past medical history, allergies, medications, health status, including review of consultants reports, laboratory and other test data, was performed as part of comprehensive evaluation and provision of chronic care management services.   SDOH:  (Social Determinants of Health) assessments and interventions performed: Yes SDOH Interventions    Flowsheet Row Chronic Care Management from 10/01/2022 in [place] Management from 06/04/2022 in [place] Management from 03/26/2022 in [place] Visit from 02/21/2022 in [place] Visit from 02/13/2022 in [place] Visit from 01/08/2022 in [place] Interventions        Food Insecurity Interventions -- -- Intervention Not Indicated -- -- --  Transportation Interventions Intervention Not Indicated Intervention Not Indicated Intervention Not Indicated -- -- --  Depression Interventions/Treatment  -- -- -- PHQ2-9 Score <4 Follow-up Not Indicated PHQ2-9 Score <4 Follow-up Not Indicated PHQ2-9 Score <4 Follow-up Not Indicated  Financial Strain Interventions Other (Comment)  [PAP] Other (Comment)  [PAP] -- -- -- --       SDOH  Interventions    Flowsheet Row Chronic Care Management from 10/01/2022 in [place] Management from 06/04/2022 in [place] Management from 03/26/2022 in [place] Visit from 02/21/2022 in [place] Visit from 02/13/2022 in [place] Visit from 01/08/2022 in [place] Interventions        Food Insecurity Interventions -- --  Intervention Not Indicated -- -- --  Transportation Interventions Intervention Not Indicated Intervention Not Indicated Intervention Not Indicated -- -- --  Depression Interventions/Treatment  -- -- -- ENI7-7 Score <4 Follow-up Not Indicated PHQ2-9 Score <4 Follow-up Not Indicated PHQ2-9 Score <4 Follow-up Not Indicated  Financial Strain Interventions Other (Comment)  [PAP] Other (Comment)  [PAP] -- -- -- --       CCM Care Plan  Allergies  Allergen Reactions   Entresto [Sacubitril-Valsartan]     Cough    Lisinopril Cough    Medications Reviewed Today     Reviewed by Lane Hacker, [facility] (Pharmacist) on 10/01/22 at 1437  Med List Status: <None>   Medication Order Taking? Sig Documenting Provider Last Dose Status Informant  amiodarone (PACERONE) 200 MG tablet [id number] No Take 0.5 tablets (100 mg total) by mouth daily. Allena Katz [place], [place] Taking Active   aspirin 81 MG tablet [id number] No Take 81 mg by mouth daily.  [provider] Taking Active Self           Med Note Josiah Lobo, [facility]   Fri Jul 19, 2017 12:33 PM)    atorvastatin (LIPITOR) 10 MG tablet [id number] No Take 1 tablet (10 mg total) by mouth at bedtime. Larey Dresser, MD Taking Active   Continuous Blood Gluc Receiver (FREESTYLE LIBRE 2 READER) DEVI [id number]  1 each by Does not apply route daily. [name], NP  Active   Continuous Blood Gluc Sensor (FREESTYLE LIBRE 2 SENSOR) [place] [id number]  1 each by Does not apply route every 14 (fourteen)  days. [name], NP  Active   digoxin (LANOXIN) 0.125 MG tablet [id number] No Take 1 tablet (0.125 mg total) by mouth daily. Larey Dresser, MD Taking Active   Dulaglutide (TRULICITY) 4.5 PF/7.9KW [place] [id number]  Inject 4.5 mg as directed once a week. [name], NP  Active   gabapentin (NEURONTIN) 300 MG capsule [id number] No Take 1 capsule (300 mg total) by mouth 3 (three) times daily. [name], NP Taking Active Self  glimepiride (AMARYL) 4 MG tablet [id number] No Take 1 tablet (4 mg total) by mouth daily with breakfast. [name], NP Taking Active   insulin detemir (LEVEMIR) 100 UNIT/ML injection [id number]  Inject 0.8 mLs (80 Units total) into the skin at bedtime. [name], NP  Active   insulin lispro (HUMALOG KWIKPEN) 100 UNIT/ML KwikPen [id number]  Inject 5 Units into the skin 3 (three) times daily. [name], NP  Active   Insulin Pen Needle 30G X 6 MM MISC [id number] No 1 Application by Does not apply route daily. [name], NP Taking Active   Lancets (ONETOUCH DELICA PLUS [id number]) [place] [id number] No TEST ONCE DAILY [name], NP Taking Active   metFORMIN (GLUCOPHAGE) 1000 MG tablet [id number] No TAKE 1 TABLET BY MOUTH TWICE  DAILY WITH MEALS [name], NP Taking Active   metoprolol succinate (TOPROL-XL) 50 MG 24 hr tablet [id number] No Take 1 tablet (50 mg total) by mouth daily. Larey Dresser, MD Taking Active   nitroGLYCERIN (NITROSTAT) 0.3 MG SL tablet [id number] No Place 1 tablet (0.3 mg total) under the tongue every 5 (five) minutes as needed for chest pain. Rafael Bihari, FNP Taking Active   Donald Siva test strip [id number] No TEST ONCE DAILY [name], NP Taking Active   pantoprazole (PROTONIX) 40 MG tablet [id number] No TAKE 1 TABLET BY MOUTH  DAILY [name], NP Taking Active   polyethylene glycol powder (GLYCOLAX/MIRALAX) 17 GM/SCOOP powder [id number]  No Take 17 g by mouth daily. [provider] Taking Active Self  QUEtiapine (SEROQUEL) 200 MG tablet [id number] No TAKE 1 TABLET BY MOUTH DAILY AT  BEDTIME [name], NP Taking Active   spironolactone (ALDACTONE) 25 MG tablet [id number]  TAKE 1 TABLET BY MOUTH DAILY Larey Dresser, MD  Active   torsemide (DEMADEX) 20 MG tablet [id number] No Patient takes 1 tablet by mouth one day then 2 tablets by mouth every other day. [provider] Taking Active   valsartan (DIOVAN) 40 MG tablet [id number] No Take 1.5 tablets (60 mg total) by mouth 2 (two) times daily. Larey Dresser, MD Taking Active             Patient Active Problem List   Diagnosis Date Noted   Chronic systolic heart failure ([place]) 01/17/2022   History of colonic polyps    Polyp of sigmoid colon    Moderate episode of recurrent major depressive disorder ([place]) 11/24/2021   OSA (obstructive sleep apnea) 09/18/2020   Aortic atherosclerosis ([place]) 06/10/2020   Hepatic steatosis 06/10/2020   HFrEF (heart failure with reduced ejection fraction) ([place]) 06/10/2020   Osteoarthritis of left hip 03/16/2020   Type 2 diabetes mellitus with peripheral neuropathy ([place]) 12/11/2019   NICM (nonischemic cardiomyopathy) ([place]) 09/11/2019   CAD (coronary artery disease), native coronary artery 09/11/2019   Hypertension associated with diabetes ([place]) 06/05/2019   Morbid obesity ([place]) 10/03/2018   Anemia 10/03/2018   COPD (chronic obstructive pulmonary disease) ([place]) 03/13/2018   Insomnia 11/06/2017   Hyperlipidemia associated with type 2 diabetes mellitus ([place]) 07/19/2017   S/P vaginal hysterectomy 12/17/2016   Vaginal atrophy 10/31/2016   Hx of colonic polyps    DDD (degenerative disc disease), lumbar 02/21/2016   Low back pain 02/14/2016   GAD (generalized anxiety disorder) 06/06/2015   MDD (major depressive disorder) 06/06/2015   Gastroesophageal reflux disease 06/06/2015    Immunization History  Administered Date(s) Administered   Fluad Quad(high  Dose 65+) 07/26/2021, 07/05/2022   Hepatitis A, Adult 02/05/2022, 03/08/2022, 08/09/2022   Influenza, High Dose Seasonal PF 06/16/2019, 07/04/2020   Influenza, Seasonal, Injecte, Preservative Fre 08/18/2008, 07/14/2014   Influenza,inj,Quad PF,6+ Mos 09/25/2013, 08/05/2015, 08/02/2016, 07/24/2017   Moderna Sars-Covid-2 Vaccination 12/02/2019, 12/30/2019, 10/29/2020   Pneumococcal Conjugate-13 03/13/2018   Pneumococcal Polysaccharide-23 08/06/2019   Tdap 08/08/2009, 05/13/2014    Conditions to be addressed/monitored: HLD DM HFrEF HTN  Care Plan : [place]  Updates made by Lane Hacker, [place] since 10/01/2022 12:00 AM     Problem: hld copd htn dm2 copd hfref      Long-Range Goal: disease management   Start Date: 03/26/2022  Recent Progress: On track  Note:   Current Barriers:  Unable to independently afford treatment regimen Unable to achieve control of DM   Pharmacist Clinical Goal(s):  Patient will verbalize ability to afford treatment regimen achieve adherence to monitoring guidelines and medication adherence to achieve therapeutic efficacy through collaboration with PharmD and provider.   Interventions: 1:1 collaboration with [name], NP regarding development and update of comprehensive plan of care as evidenced by provider attestation and co-signature Inter-disciplinary care team collaboration (see longitudinal plan of care) Comprehensive medication review performed; medication list updated in electronic medical record  Diabetes (A1c goal <8%) Lab Results  Component Value Date   HGBA1C 8.1 (H) 09/17/2022   HGBA1C 13.8 (H) 05/31/2022   HGBA1C 11.9 (H) 02/21/2022   Lab Results  Component Value Date   QGBEEFEOFH 21  09/17/2022   LDLCALC 74 05/31/2022   CREATININE 1.04 (H) 09/17/2022   Lab Results  Component Value Date   NA 144 09/17/2022   K 4.3 09/17/2022   CREATININE 1.04 (H) 09/17/2022   EGFR 58 (L) 09/17/2022   GFRNONAA 59 (L)  08/31/2022   GLUCOSE 141 (H) 09/17/2022   Lab Results  Component Value Date   WBC 6.1 11/24/2021   HGB 15.2 11/24/2021   HCT 48.6 (H) 11/24/2021   MCV 90 11/24/2021   PLT 299 11/24/2021   Lab Results  Component Value Date   LABMICR See below: 08/29/2021   LABMICR See below: 03/22/2016   MICROALBUR 10 09/17/2022   MICROALBUR 10 08/25/2021  -Uncontrolled -Current medications: Trulicity 4.5 mg units Appropriate, query effective PAP Approved 2023 Basaglar 30 units into the skin daily, increase by 2 units daily until fasting blood sugar less than 150  Appropriate, query effective PAP approved 2023 Glimaperide 41m QD Appropriate, Query effective,  -Medications previously tried: farxiga - yeast infxn,   -Current home glucose readings fasting glucose: August 2023: Only tests weekly but said was in 300's this morning December 2023: Patient not testing daily. Counseled to start -Denies hypoglycemic/hyperglycemic symptoms -Current meal patterns: salads, bananas, eggs, vegetables. Coffee, tea.  -Current exercise: limited -Educated on A1c and blood sugar goals; -Counseled to check feet daily and get yearly eye exams -Counseled on diet and exercise extensively August 2023: Patient doing 30 units currently. With weight of 90kg, MDD is 45 units long acting. This should happen in about 2 weeks. Will have concierge call to check in on patient. Asked PCP if we could start paperwork for Short acting insulin because patient can't afford and will most likely require it -Counseled on testing daily -Will have CCM team f/u every 2 weeks until sugars at goal December 2023: Sugars looking better. Will have concierge start calling again every 2 weeks for sugars. Patient stopped testing daily. -Patient told me she hasn't heard about PAP. Gave her the numbers to call and get filled  Heart Failure (Goal: manage symptoms and prevent exacerbations) -Controlled -Last ejection fraction: 35-40% (Date:  03/20/21) -HF type: Left Ventricular Failure -NYHA Class: II-III -AHA HF Stage: C (Heart disease and symptoms present) -Current treatment: Metoprolol Succ 551mQD Appropriate, effective, save, accessible Torsemide QD  Appropriate, effective, save, accessible Spironolactone 2549mD Appropriate, effective, save, accessible Digoxin 0.125 QD Appropriate, effective, save, accessible Amiodarone 100m65m Appropriate, effective, save, accessible -Medications previously tried: FarxIraneast infxn, entresto - cough  Counseled on appropriate bp monitoring at home  December 2023: Patient's monitor broke. Counseled to get another -Current dietary habits: "Tries to eat healthy" -Current exercise habits: None -Educated on Benefits of medications for managing symptoms and prolonging life Proper diuretic administration and potassium supplementation Importance of blood pressure control -Counseled on diet and exercise extensively Recommended to continue current medication  Patient Goals/Self-Care Activities Patient will:  - take medications as prescribed as evidenced by patient report and record review collaborate with provider on medication access solutions   CPP F/U May 2024  Kim Buchanan. - [phone]   Patient's preferred pharmacy is:  [facility] [place] [id number]: [phone]: [phone] ([facility] [facility] [place]: [phone]: [phone]  [place], [place] - [address] [place] [address] Phone: [phone] Fax: [place], [place] [address] [place] [address] Phone: [phone] Fax:  [phone]   Pt endorses 100% compliance  Follow Up:  Patient agrees to Care Plan and Follow-up. Plan: CPP F/u May 2024  Future Appointments  Date Time Provider [place]  12/18/2022  1:20 PM [name], NP CFP-CFP [place]  03/11/2023  9:00 AM CFP CCM PHARMACY CFP-CFP PEC    [place] Constable, Pharm.D. - [phone]

## 2022-10-03 ENCOUNTER — Other Ambulatory Visit: Payer: Self-pay | Admitting: Nurse Practitioner

## 2022-10-04 NOTE — Telephone Encounter (Signed)
Future visit in 2 months . Requested Prescriptions  Pending Prescriptions Disp Refills   Lancets (ONETOUCH DELICA PLUS [id number]) [place] [Pharmacy Med Name: OneTouch Delica Plus [id number]] 100 each 3    Sig: TEST ONCE DAILY     Endocrinology: Diabetes - Testing Supplies Passed - 10/03/2022 10:13 PM      Passed - Valid encounter within last 12 months    Recent Outpatient Visits           2 weeks ago Aortic atherosclerosis ([place])   [facility] [name], NP   1 month ago Type 2 diabetes mellitus with peripheral neuropathy ([place])   [facility] [name], NP   1 month ago Type 2 diabetes mellitus with peripheral neuropathy ([place])   [facility] [name], NP   2 months ago Type 2 diabetes mellitus with peripheral neuropathy ([place])   [facility] [name], NP   4 months ago Chronic systolic heart failure [facility])   [facility] [name], NP       Future Appointments             In 2 months [name], NP [facility] [place]

## 2022-10-21 DIAGNOSIS — E1159 Type 2 diabetes mellitus with other circulatory complications: Secondary | ICD-10-CM

## 2022-10-21 DIAGNOSIS — I5020 Unspecified systolic (congestive) heart failure: Secondary | ICD-10-CM

## 2022-10-25 ENCOUNTER — Telehealth: Payer: Self-pay

## 2022-10-25 NOTE — Progress Notes (Cosign Needed)
Care Management & Coordination Services Pharmacy Team  Reason for Encounter: Diabetes  Contacted patient on 10/25/2022 to discuss diabetes disease state.   Recent office visits:  None noted  Recent consult visits:  None noted  Hospital visits:  None in previous 6 months  Medications: Outpatient Encounter Medications as of 10/25/2022  Medication Sig   amiodarone (PACERONE) 200 MG tablet Take 0.5 tablets (100 mg total) by mouth daily.   aspirin 81 MG tablet Take 81 mg by mouth daily.    atorvastatin (LIPITOR) 10 MG tablet Take 1 tablet (10 mg total) by mouth at bedtime.   Continuous Blood Gluc Receiver (FREESTYLE LIBRE 2 READER) DEVI 1 each by Does not apply route daily.   Continuous Blood Gluc Sensor (FREESTYLE LIBRE 2 SENSOR) MISC 1 each by Does not apply route every 14 (fourteen) days.   digoxin (LANOXIN) 0.125 MG tablet Take 1 tablet (0.125 mg total) by mouth daily.   Dulaglutide (TRULICITY) 4.5 FY/9.2KM SOPN Inject 4.5 mg as directed once a week.   gabapentin (NEURONTIN) 300 MG capsule Take 1 capsule (300 mg total) by mouth 3 (three) times daily.   glimepiride (AMARYL) 4 MG tablet Take 1 tablet (4 mg total) by mouth daily with breakfast.   insulin detemir (LEVEMIR) 100 UNIT/ML injection Inject 0.8 mLs (80 Units total) into the skin at bedtime.   insulin lispro (HUMALOG KWIKPEN) 100 UNIT/ML KwikPen Inject 5 Units into the skin 3 (three) times daily.   Insulin Pen Needle 30G X 6 MM MISC 1 Application by Does not apply route daily.   Lancets (ONETOUCH DELICA PLUS [id number]) MISC TEST ONCE DAILY   metFORMIN (GLUCOPHAGE) 1000 MG tablet TAKE 1 TABLET BY MOUTH TWICE  DAILY WITH MEALS   metoprolol succinate (TOPROL-XL) 50 MG 24 hr tablet Take 1 tablet (50 mg total) by mouth daily.   nitroGLYCERIN (NITROSTAT) 0.3 MG SL tablet Place 1 tablet (0.3 mg total) under the tongue every 5 (five) minutes as needed for chest pain.   ONETOUCH ULTRA test strip TEST ONCE DAILY   pantoprazole (PROTONIX)  40 MG tablet TAKE 1 TABLET BY MOUTH  DAILY   polyethylene glycol powder (GLYCOLAX/MIRALAX) 17 GM/SCOOP powder Take 17 g by mouth daily.   QUEtiapine (SEROQUEL) 200 MG tablet TAKE 1 TABLET BY MOUTH DAILY AT  BEDTIME   spironolactone (ALDACTONE) 25 MG tablet TAKE 1 TABLET BY MOUTH DAILY   torsemide (DEMADEX) 20 MG tablet Patient takes 1 tablet by mouth one day then 2 tablets by mouth every other day.   valsartan (DIOVAN) 40 MG tablet Take 1.5 tablets (60 mg total) by mouth 2 (two) times daily.   No facility-administered encounter medications on file as of 10/25/2022.    Recent Relevant Labs: Lab Results  Component Value Date/Time   HGBA1C 8.1 (H) 09/17/2022 03:45 PM   HGBA1C 13.8 (H) 05/31/2022 10:02 AM   HGBA1C 8.6 (H) 01/23/2021 03:08 PM   HGBA1C 7.7 (H) 10/17/2020 10:21 AM   MICROALBUR 10 09/17/2022 03:43 PM   MICROALBUR 10 08/25/2021 10:23 AM    Kidney Function Lab Results  Component Value Date/Time   CREATININE 1.04 (H) 09/17/2022 03:45 PM   CREATININE 1.03 (H) 08/31/2022 09:27 AM   CREATININE 0.82 01/01/2018 12:00 AM   CREATININE 0.75 08/06/2016 08:24 AM   GFRNONAA 59 (L) 08/31/2022 09:27 AM   GFRNONAA 76 01/01/2018 12:00 AM   GFRAA 79 10/17/2020 10:27 AM   GFRAA 88 01/01/2018 12:00 AM    Current antihyperglycemic regimen:  Trulicity 4.5 mg  inject 4.5 mg weekly Levemir 100 units inject 80 units at bedtime Humalog 100 units inject 5 units 3x daily Metformin 1000 mg take 1 tab twice daily  Patient verbally confirms she is taking the above medications as directed. Yes  What diet changes have been made to improve diabetes control?        Patient has been eating less salty foods  What recent interventions/DTPs have been made to improve glycemic control:  None noted  Have there been any recent hospitalizations or ED visits since last visit with PharmD? No  Patient denies hypoglycemic symptoms, including Pale, Sweaty, Shaky, Hungry, Nervous/irritable, and Vision  changes  Patient denies hyperglycemic symptoms, including blurry vision, excessive thirst, fatigue, polyuria, and weakness  How often are you checking your blood sugar? twice daily  What are your blood sugars ranging?  Fasting: 176 Before meals: 128 After meals:  Bedtime:   During the week, how often does your blood glucose drop below 70? Never  Are you checking your feet daily/regularly? Yes  Adherence Review: Is the patient currently on a STATIN medication? Yes Is the patient currently on ACE/ARB medication? Yes Does the patient have >5 day gap between last estimated fill dates? No  Star Rating Drugs:  Valsartan 40 mg Last filled:09/28/22 90 DS, 07/04/22 90 DS Metformin 1000 mg Last filled:08/19/22 90 DS, 05/26/22 90 DS Atorvastatin 10 mg Last filled:08/19/22 90 DS, 05/26/22 90 DS   Care Gaps: Annual wellness visit in last year? Yes   Corrie Mckusick, RMA

## 2022-10-30 ENCOUNTER — Ambulatory Visit: Payer: Medicare Other | Admitting: Family Medicine

## 2022-11-01 ENCOUNTER — Other Ambulatory Visit: Payer: Self-pay

## 2022-11-01 DIAGNOSIS — Z87891 Personal history of nicotine dependence: Secondary | ICD-10-CM

## 2022-11-01 DIAGNOSIS — Z122 Encounter for screening for malignant neoplasm of respiratory organs: Secondary | ICD-10-CM

## 2022-11-02 ENCOUNTER — Ambulatory Visit: Payer: Medicare Other | Admitting: Nurse Practitioner

## 2022-11-15 ENCOUNTER — Ambulatory Visit
Admission: RE | Admit: 2022-11-15 | Discharge: 2022-11-15 | Disposition: A | Discharge status: Home / Self Care | Payer: Medicare Other | Source: Ambulatory Visit | Attending: Acute Care | Admitting: Acute Care

## 2022-11-15 DIAGNOSIS — Z87891 Personal history of nicotine dependence: Secondary | ICD-10-CM | POA: Diagnosis not present

## 2022-11-15 DIAGNOSIS — Z122 Encounter for screening for malignant neoplasm of respiratory organs: Secondary | ICD-10-CM

## 2022-11-16 ENCOUNTER — Other Ambulatory Visit: Payer: Self-pay | Admitting: Acute Care

## 2022-11-16 ENCOUNTER — Ambulatory Visit
Admission: RE | Admit: 2022-11-16 | Discharge: 2022-11-16 | Disposition: A | Discharge status: Home / Self Care | Payer: Medicare Other | Source: Ambulatory Visit | Attending: Physician Assistant | Admitting: Physician Assistant

## 2022-11-16 ENCOUNTER — Ambulatory Visit (INDEPENDENT_AMBULATORY_CARE_PROVIDER_SITE_OTHER): Payer: Medicare Other | Admitting: Physician Assistant

## 2022-11-16 ENCOUNTER — Ambulatory Visit
Admission: RE | Admit: 2022-11-16 | Discharge: 2022-11-16 | Disposition: A | Discharge status: Home / Self Care | Payer: Medicare Other | Attending: Physician Assistant | Admitting: Physician Assistant

## 2022-11-16 VITALS — BP 94/57 | HR 89 | Temp 98.0°F | Ht 62.03 in | Wt 215.8 lb

## 2022-11-16 DIAGNOSIS — Y92009 Unspecified place in unspecified non-institutional (private) residence as the place of occurrence of the external cause: Secondary | ICD-10-CM

## 2022-11-16 DIAGNOSIS — Z122 Encounter for screening for malignant neoplasm of respiratory organs: Secondary | ICD-10-CM

## 2022-11-16 DIAGNOSIS — R202 Paresthesia of skin: Secondary | ICD-10-CM

## 2022-11-16 DIAGNOSIS — W19XXXA Unspecified fall, initial encounter: Secondary | ICD-10-CM

## 2022-11-16 DIAGNOSIS — Z87891 Personal history of nicotine dependence: Secondary | ICD-10-CM

## 2022-11-16 NOTE — Progress Notes (Signed)
Your spinal xrays did not show any acute injuries or fractures at this time. We will keep you updated on the lab results as they are available. Please remember that if your symptoms become worse or if you develop new symptoms, you should go to the ED for further evaluation.

## 2022-11-16 NOTE — Progress Notes (Signed)
Acute Office Visit   Patient: Kim Buchanan   DOB: 04-05-1951   72 y.o. Female  MRN: [id number] Visit Date: 11/16/2022  Today's healthcare provider: Dani Gobble Winslow Ederer, PA-C  Introduced myself to the patient as a Journalist, newspaper and provided education on APPs in clinical practice.    Chief Complaint  Patient presents with  . Numbness    All over her body, started last night. Numbness started after she fell. Had to call Paramedics since she fell refused going to hosp.   Subjective    HPI HPI     Numbness    Additional comments: All over her body, started last night. Numbness started after she fell. Had to call Paramedics since she fell refused going to hosp.      Last edited by Jerelene Redden, CMA on 11/16/2022  1:28 PM.        Patient [place] on 11/16/22 - called paramedics to assist with getting up but refused to go to hospital  She reports she is numb and tingling all over and won't go away Onset: sudden after fall  Duration: since last night after fall  She states she was getting up to go to toilet in the night and fell beside the toilet  She denies hitting head but reports she blacked out  She denies pain in any part of her body but reports numbness and tingling everywhere States she is having trouble holding things because she can't feel things in her hands States she was having some trouble walking and had to use a walker for a bit   She reports some neck pain the day before she fell but this is resolved   Reviewed Paramedic paperwork supplied by patient      Medications: Outpatient Medications Prior to Visit  Medication Sig  . amiodarone (PACERONE) 200 MG tablet Take 0.5 tablets (100 mg total) by mouth daily.  Marland Kitchen aspirin 81 MG tablet Take 81 mg by mouth daily.   Marland Kitchen atorvastatin (LIPITOR) 10 MG tablet Take 1 tablet (10 mg total) by mouth at bedtime.  . Continuous Blood Gluc Receiver (FREESTYLE LIBRE 2 READER) DEVI 1 each by Does not apply route daily.  .  Continuous Blood Gluc Sensor (FREESTYLE LIBRE 2 SENSOR) MISC 1 each by Does not apply route every 14 (fourteen) days.  . digoxin (LANOXIN) 0.125 MG tablet Take 1 tablet (0.125 mg total) by mouth daily.  . Dulaglutide (TRULICITY) 4.5 NK/5.3ZJ SOPN Inject 4.5 mg as directed once a week.  . gabapentin (NEURONTIN) 300 MG capsule Take 1 capsule (300 mg total) by mouth 3 (three) times daily.  Marland Kitchen glimepiride (AMARYL) 4 MG tablet Take 1 tablet (4 mg total) by mouth daily with breakfast.  . insulin detemir (LEVEMIR) 100 UNIT/ML injection Inject 0.8 mLs (80 Units total) into the skin at bedtime.  . insulin lispro (HUMALOG KWIKPEN) 100 UNIT/ML KwikPen Inject 5 Units into the skin 3 (three) times daily.  . Insulin Pen Needle 30G X 6 MM MISC 1 Application by Does not apply route daily.  . Lancets (ONETOUCH DELICA PLUS QBHALP37T) MISC TEST ONCE DAILY  . metFORMIN (GLUCOPHAGE) 1000 MG tablet TAKE 1 TABLET BY MOUTH TWICE  DAILY WITH MEALS  . metoprolol succinate (TOPROL-XL) 50 MG 24 hr tablet Take 1 tablet (50 mg total) by mouth daily.  . nitroGLYCERIN (NITROSTAT) 0.3 MG SL tablet Place 1 tablet (0.3 mg total) under the tongue every 5 (five) minutes as needed for  chest pain.  Glory Rosebush ULTRA test strip TEST ONCE DAILY  . pantoprazole (PROTONIX) 40 MG tablet TAKE 1 TABLET BY MOUTH  DAILY  . polyethylene glycol powder (GLYCOLAX/MIRALAX) 17 GM/SCOOP powder Take 17 g by mouth daily.  . QUEtiapine (SEROQUEL) 200 MG tablet TAKE 1 TABLET BY MOUTH DAILY AT  BEDTIME  . spironolactone (ALDACTONE) 25 MG tablet TAKE 1 TABLET BY MOUTH DAILY  . torsemide (DEMADEX) 20 MG tablet Patient takes 1 tablet by mouth one day then 2 tablets by mouth every other day.  . valsartan (DIOVAN) 40 MG tablet Take 1.5 tablets (60 mg total) by mouth 2 (two) times daily.   No facility-administered medications prior to visit.    Review of Systems  Respiratory:  Negative for shortness of breath and wheezing.   Musculoskeletal:  Positive for  gait problem. Negative for back pain, myalgias, neck pain and neck stiffness.  Neurological:  Positive for syncope, weakness and numbness. Negative for dizziness, tremors, facial asymmetry, speech difficulty and headaches.    {Labs  Heme  Chem  Endocrine  Serology  Results Review (optional):23779}   Objective    BP (!) 94/57   Pulse 89   Temp 98 F (36.7 C) (Oral)   Ht 5' 2.03" (1.576 m)   Wt 215 lb 12.8 oz (97.9 kg)   SpO2 95%   BMI 39.43 kg/m  {Show previous vital signs (optional):23777}  Physical Exam Vitals reviewed.  Constitutional:      General: She is awake.     Appearance: Normal appearance. She is well-developed and well-groomed.  HENT:     Head: Normocephalic and atraumatic.  Eyes:     General: No visual field deficit. Neck:     Comments: Pain with extension of neck  Pulmonary:     Effort: Tachypnea and prolonged expiration present.  Musculoskeletal:     Cervical back: Normal range of motion and neck supple. Pain with movement present. Normal range of motion.  Neurological:     General: No focal deficit present.     Mental Status: She is alert and oriented to person, place, and time. Mental status is at baseline.     GCS: GCS eye subscore is 4. GCS verbal subscore is 5. GCS motor subscore is 6.     Cranial Nerves: Cranial nerves 2-12 are intact. No cranial nerve deficit, dysarthria or facial asymmetry.     Sensory: Sensation is intact.     Motor: Pronator drift present. No weakness, tremor or abnormal muscle tone.     Coordination: Romberg sign positive. Coordination abnormal.     Gait: Gait abnormal.     Comments: Proprioception is intact bilaterally Able to identify monofilament sensation along all extremities  Strength is 5/5 at arms, hands, legs   Psychiatric:        Behavior: Behavior is cooperative.      No results found for any visits on 11/16/22.  Assessment & Plan      No follow-ups on file.      Problem List Items Addressed This Visit    None Visit Diagnoses     Tingling in extremities    -  Primary   Relevant Orders   DG Cervical Spine Complete (Completed)   DG Lumbar Spine Complete (Completed)   DG Thoracic Spine W/Swimmers (Completed)   B12   Comp Met (CMET)   CBC w/Diff   Folate   Vitamin D (25 hydroxy)   Fall as cause of accidental injury in home as place  of occurrence, initial encounter            No follow-ups on file.   I, Catherina Pates E Tavia Stave, PA-C, have reviewed all documentation for this visit. The documentation on 11/16/22 for the exam, diagnosis, procedures, and orders are all accurate and complete.   Talitha Givens, MHS, PA-C [facility]

## 2022-11-16 NOTE — Progress Notes (Signed)
Please let patient know that her Lung screening is stable.  The recommendation is to repeat it in 1 year.

## 2022-11-16 NOTE — Patient Instructions (Addendum)
Please go here for your  xray   [address], [place] [address]   We will keep you updated on the results of your testing

## 2022-11-17 LAB — COMPREHENSIVE METABOLIC PANEL
ALT: 564 IU/L (ref 0–32)
AST: 661 IU/L (ref 0–40)
Albumin/Globulin Ratio: 1.2 (ref 1.2–2.2)
Albumin: 3.6 g/dL — ABNORMAL LOW (ref 3.9–4.9)
Alkaline Phosphatase: 180 IU/L — ABNORMAL HIGH (ref 44–121)
BUN/Creatinine Ratio: 29 — ABNORMAL HIGH (ref 12–28)
BUN: 45 mg/dL — ABNORMAL HIGH (ref 8–27)
Bilirubin Total: 1.3 mg/dL — ABNORMAL HIGH (ref 0.0–1.2)
CO2: 17 mmol/L — ABNORMAL LOW (ref 20–29)
Calcium: 8.1 mg/dL — ABNORMAL LOW (ref 8.7–10.3)
Chloride: 93 mmol/L — ABNORMAL LOW (ref 96–106)
Creatinine, Ser: 1.54 mg/dL — ABNORMAL HIGH (ref 0.57–1.00)
Globulin, Total: 3.1 g/dL (ref 1.5–4.5)
Glucose: 449 mg/dL — ABNORMAL HIGH (ref 70–99)
Potassium: 4.6 mmol/L (ref 3.5–5.2)
Sodium: 131 mmol/L — ABNORMAL LOW (ref 134–144)
Total Protein: 6.7 g/dL (ref 6.0–8.5)
eGFR: 36 mL/min/{1.73_m2} — ABNORMAL LOW (ref >59)

## 2022-11-17 LAB — CBC WITH DIFFERENTIAL/PLATELET
Basophils Absolute: 0.1 10*3/uL (ref 0.0–0.2)
Basos: 1 %
EOS (ABSOLUTE): 0.2 10*3/uL (ref 0.0–0.4)
Eos: 3 %
Hematocrit: 34.4 % (ref 34.0–46.6)
Hemoglobin: 11 g/dL — ABNORMAL LOW (ref 11.1–15.9)
Immature Grans (Abs): 0.1 10*3/uL (ref 0.0–0.1)
Immature Granulocytes: 1 %
Lymphocytes Absolute: 1.3 10*3/uL (ref 0.7–3.1)
Lymphs: 17 %
MCH: 29.6 pg (ref 26.6–33.0)
MCHC: 32 g/dL (ref 31.5–35.7)
MCV: 93 fL (ref 79–97)
Monocytes Absolute: 0.7 10*3/uL (ref 0.1–0.9)
Monocytes: 8 %
Neutrophils Absolute: 5.8 10*3/uL (ref 1.4–7.0)
Neutrophils: 70 %
Platelets: 246 10*3/uL (ref 150–450)
RBC: 3.71 x10E6/uL — ABNORMAL LOW (ref 3.77–5.28)
RDW: 13.6 % (ref 11.7–15.4)
WBC: 8.1 10*3/uL (ref 3.4–10.8)

## 2022-11-17 LAB — FOLATE: Folate: 13.6 ng/mL (ref >3.0)

## 2022-11-17 LAB — VITAMIN B12: Vitamin B-12: 494 pg/mL (ref 232–1245)

## 2022-11-17 LAB — VITAMIN D 25 HYDROXY (VIT D DEFICIENCY, FRACTURES): Vit D, 25-Hydroxy: 24.4 ng/mL — ABNORMAL LOW (ref 30.0–100.0)

## 2022-11-19 DIAGNOSIS — R202 Paresthesia of skin: Secondary | ICD-10-CM | POA: Insufficient documentation

## 2022-11-19 NOTE — Progress Notes (Signed)
Your liver enzymes are very high indicating that something may be harming your liver. It also appears that you were rather dehydrated and your glucose was very high. Given the fact that you are having neurological symptoms (numbness and tingling, trouble walking and balancing) I recommend that you go to the ED if you haven't already for evaluation and IV fluids as well as imaging to make sure there aren't further concerns.

## 2022-11-19 NOTE — Assessment & Plan Note (Addendum)
Acute, new concern after falling in the night She denies head injury but reports she may have blacked out She states she now has numbness and tingling of all extremities that is not improving Reviewed imaging of cervical, thoracic and lumbar spine - no acute injuries or dislocations noted  CMP was concerning for electrolyte abnormalities (low sodium, chloride,calcium) along with highly elevated ALT and AST (elevations to BUN, creatinine, total bilirubin as well) - she has hx dx of hepatic steatosis but results review demonstrates current levels are far above her norm Glucose was 449 - she is a poorly controlled diabetic patient - cannot exclude DKA or HHS at this time I have sent a message that I recommend she be evaluated in the ED as well as given fluids based on her lab results and the fact she has concerns for neurological symptoms

## 2022-11-20 ENCOUNTER — Other Ambulatory Visit: Payer: Self-pay

## 2022-11-20 ENCOUNTER — Emergency Department
Admission: EM | Admit: 2022-11-20 | Discharge: 2022-11-20 | Disposition: A | Discharge status: Home / Self Care | Payer: Medicare Other | Attending: Emergency Medicine | Admitting: Emergency Medicine

## 2022-11-20 ENCOUNTER — Emergency Department: Payer: Medicare Other

## 2022-11-20 DIAGNOSIS — I5022 Chronic systolic (congestive) heart failure: Secondary | ICD-10-CM | POA: Diagnosis not present

## 2022-11-20 DIAGNOSIS — R42 Dizziness and giddiness: Secondary | ICD-10-CM | POA: Diagnosis not present

## 2022-11-20 DIAGNOSIS — E119 Type 2 diabetes mellitus without complications: Secondary | ICD-10-CM | POA: Diagnosis not present

## 2022-11-20 DIAGNOSIS — I10 Essential (primary) hypertension: Secondary | ICD-10-CM | POA: Diagnosis not present

## 2022-11-20 DIAGNOSIS — K760 Fatty (change of) liver, not elsewhere classified: Secondary | ICD-10-CM | POA: Insufficient documentation

## 2022-11-20 DIAGNOSIS — R7989 Other specified abnormal findings of blood chemistry: Secondary | ICD-10-CM | POA: Diagnosis not present

## 2022-11-20 DIAGNOSIS — J449 Chronic obstructive pulmonary disease, unspecified: Secondary | ICD-10-CM | POA: Insufficient documentation

## 2022-11-20 DIAGNOSIS — R160 Hepatomegaly, not elsewhere classified: Secondary | ICD-10-CM | POA: Diagnosis not present

## 2022-11-20 DIAGNOSIS — R945 Abnormal results of liver function studies: Secondary | ICD-10-CM | POA: Diagnosis not present

## 2022-11-20 DIAGNOSIS — R1010 Upper abdominal pain, unspecified: Secondary | ICD-10-CM | POA: Diagnosis present

## 2022-11-20 LAB — PROTIME-INR
INR: 1.2 (ref 0.8–1.2)
Prothrombin Time: 15.1 seconds (ref 11.4–15.2)

## 2022-11-20 LAB — COMPREHENSIVE METABOLIC PANEL
ALT: 318 U/L — ABNORMAL HIGH (ref 0–44)
AST: 173 U/L — ABNORMAL HIGH (ref 15–41)
Albumin: 3.6 g/dL (ref 3.5–5.0)
Alkaline Phosphatase: 205 U/L — ABNORMAL HIGH (ref 38–126)
Anion gap: 12 (ref 5–15)
BUN: 28 mg/dL — ABNORMAL HIGH (ref 8–23)
CO2: 24 mmol/L (ref 22–32)
Calcium: 9.6 mg/dL (ref 8.9–10.3)
Chloride: 101 mmol/L (ref 98–111)
Creatinine, Ser: 0.98 mg/dL (ref 0.44–1.00)
GFR, Estimated: >60 mL/min (ref >60)
Glucose, Bld: 247 mg/dL — ABNORMAL HIGH (ref 70–99)
Potassium: 3.9 mmol/L (ref 3.5–5.1)
Sodium: 137 mmol/L (ref 135–145)
Total Bilirubin: 1.2 mg/dL (ref 0.3–1.2)
Total Protein: 8.1 g/dL (ref 6.5–8.1)

## 2022-11-20 LAB — CBC WITH DIFFERENTIAL/PLATELET
Abs Immature Granulocytes: 0.03 10*3/uL (ref 0.00–0.07)
Basophils Absolute: 0 10*3/uL (ref 0.0–0.1)
Basophils Relative: 1 %
Eosinophils Absolute: 0.2 10*3/uL (ref 0.0–0.5)
Eosinophils Relative: 3 %
HCT: 38.7 % (ref 36.0–46.0)
Hemoglobin: 12.4 g/dL (ref 12.0–15.0)
Immature Granulocytes: 1 %
Lymphocytes Relative: 28 %
Lymphs Abs: 1.9 10*3/uL (ref 0.7–4.0)
MCH: 29 pg (ref 26.0–34.0)
MCHC: 32 g/dL (ref 30.0–36.0)
MCV: 90.6 fL (ref 80.0–100.0)
Monocytes Absolute: 0.5 10*3/uL (ref 0.1–1.0)
Monocytes Relative: 7 %
Neutro Abs: 4 10*3/uL (ref 1.7–7.7)
Neutrophils Relative %: 60 %
Platelets: 310 10*3/uL (ref 150–400)
RBC: 4.27 MIL/uL (ref 3.87–5.11)
RDW: 14.9 % (ref 11.5–15.5)
WBC: 6.7 10*3/uL (ref 4.0–10.5)
nRBC: 0 % (ref 0.0–0.2)

## 2022-11-20 LAB — CBG MONITORING, ED: Glucose-Capillary: 242 mg/dL — ABNORMAL HIGH (ref 70–99)

## 2022-11-20 LAB — HEPATITIS PANEL, ACUTE
HCV Ab: REACTIVE — AB
Hep A IgM: NONREACTIVE
Hep B C IgM: NONREACTIVE
Hepatitis B Surface Ag: NONREACTIVE

## 2022-11-20 LAB — LACTIC ACID, PLASMA
Lactic Acid, Venous: 3.6 mmol/L (ref 0.5–1.9)
Lactic Acid, Venous: 3.3 mmol/L (ref 0.5–1.9)

## 2022-11-20 LAB — LIPASE, BLOOD: Lipase: 54 U/L — ABNORMAL HIGH (ref 11–51)

## 2022-11-20 LAB — DIGOXIN LEVEL: Digoxin Level: 1 ng/mL (ref 0.8–2.0)

## 2022-11-20 MED ORDER — SODIUM CHLORIDE 0.9 % IV BOLUS
500.0000 mL | Freq: Once | INTRAVENOUS | Status: AC
  Administered 2022-11-20: 500 mL via INTRAVENOUS

## 2022-11-20 NOTE — ED Provider Notes (Signed)
IR I met metabolic  [facility] Provider Note    Event Date/Time   First MD Initiated Contact with Patient 11/20/22 1039     (approximate)   History   Abnormal Lab   HPI  Kim Buchanan is a 72 y.o. female with past medical history significant for nonischemic cardiomyopathy, HFrEF, diabetes, frequent PVCs on amiodarone and digoxin, COPD, elevated LFTs, who presents to the emergency department with abnormal lab value.  Patient states that she was told on Friday to come into the emergency department for evaluation.  On Thursday states that she had an episode where she did not feel well.  States that she had dizziness and felt like her whole body was numb.  Had nausea and vomiting at that time.  Was having abdominal pain.  Evaluated at her primary care physician office on Friday and had lab work done.  Told that she had elevated liver function test and to be evaluated at the emergency department.  States that she has improved over the weekend no longer having any significant dizziness or nausea and vomiting.  Some mild upper abdominal pain at this time.  Denies any new falls or trauma since Thursday.  No head injury.     Physical Exam   Triage Vital Signs: ED Triage Vitals  Enc Vitals Group     BP 11/20/22 0933 126/62     Pulse Rate 11/20/22 0933 99     Resp 11/20/22 0933 20     Temp 11/20/22 0933 98.4 F (36.9 C)     Temp Source 11/20/22 0933 Oral     SpO2 11/20/22 0933 96 %     Weight 11/20/22 0933 214 lb (97.1 kg)     Height 11/20/22 0933 '5\' 2"'$  (1.575 m)     Head Circumference --      Peak Flow --      Pain Score 11/20/22 0949 0     Pain Loc --      Pain Edu? --      Excl. in [place]? --     Most recent vital signs: Vitals:   11/20/22 1216 11/20/22 1500  BP: (!) 104/59   Pulse: 81 79  Resp: 18   Temp:  98.4 F (36.9 C)  SpO2: 95% 93%    Physical Exam Constitutional:      Appearance: She is well-developed.  HENT:     Head: Atraumatic.   Eyes:     Conjunctiva/sclera: Conjunctivae normal.  Cardiovascular:     Rate and Rhythm: Regular rhythm.  Pulmonary:     Effort: No respiratory distress.  Abdominal:     General: There is no distension.  Musculoskeletal:        General: Normal range of motion.     Cervical back: Normal range of motion.  Skin:    General: Skin is warm.  Neurological:     Mental Status: She is alert. Mental status is at baseline.     IMPRESSION / MDM / ASSESSMENT AND PLAN / ED COURSE  I reviewed the triage vital signs and the nursing notes.  On chart review outside records I can see that the patient has elevation of LFTs in the 600s with a baseline elevation of LFTs in the 100s.   Differential diagnosis including medication side effect, acute hepatitis, acute cholecystitis, choledocholithiasis   EKG  I, Nathaniel Man, the attending physician, personally viewed and interpreted this ECG.   Rate: Normal  Rhythm: Normal sinus  Axis: Normal  Intervals: Normal  ST&T Change: None  No tachycardic or bradycardic dysrhythmias while on cardiac telemetry.  RADIOLOGY I independently reviewed imaging, my interpretation of imaging: RUQ ultrasound no signs of acute cholecystitis  Read as hepatic steatosis  LABS (all labs ordered are listed, but only abnormal results are displayed) Labs interpreted as -    Labs Reviewed  COMPREHENSIVE METABOLIC PANEL - Abnormal; Notable for the following components:      Result Value   Glucose, Bld 247 (*)    BUN 28 (*)    AST 173 (*)    ALT 318 (*)    Alkaline Phosphatase 205 (*)    All other components within normal limits  LIPASE, BLOOD - Abnormal; Notable for the following components:   Lipase 54 (*)    All other components within normal limits  LACTIC ACID, PLASMA - Abnormal; Notable for the following components:   Lactic Acid, Venous 3.6 (*)    All other components within normal limits  LACTIC ACID, PLASMA - Abnormal; Notable for the following  components:   Lactic Acid, Venous 3.3 (*)    All other components within normal limits  CBG MONITORING, ED - Abnormal; Notable for the following components:   Glucose-Capillary 242 (*)    All other components within normal limits  CBC WITH DIFFERENTIAL/PLATELET  PROTIME-INR  DIGOXIN LEVEL  HEPATITIS PANEL, ACUTE    TREATMENT  500 bolus of fluid  MDM   Repeat lactic acid remains stable at 3.3.  Significant improvement of her LFTs when compared to her lab work that was done on Friday.  Believe that her LFTs from Friday was likely secondary to a viral illness.  Has quickly resolved.  Hepatitis panel was added on but has not resulted and will not result today.  Patient without any symptoms of acute hepatitis at this time.  No nausea or vomiting.  Patient is on multiple home medications including amiodarone that could be elevating her LFTs.  No chronic Tylenol use.  Patient is also on metformin and that in addition to her hepatic steatosis could be the reason why she is not clearing her lactic acid.  Patient states that she feels fine at her normal state of health.  Do not feel that the patient meets criteria for admission or has any concerning symptoms for sepsis.  Patient states that she will call and discussed with her primary care physician on follow-up her home medications and whether she needs to be switched to different medications.  Given return precautions for any worsening symptoms.   PROCEDURES:  Critical Care performed: No  Procedures  Patient's presentation is most consistent with acute presentation with potential threat to life or bodily function.   MEDICATIONS ORDERED IN ED: Medications  sodium chloride 0.9 % bolus 500 mL (0 mLs Intravenous Stopped 11/20/22 1429)    FINAL CLINICAL IMPRESSION(S) / ED DIAGNOSES   Final diagnoses:  Elevated LFTs  Hepatic steatosis     Rx / DC Orders   ED Discharge Orders     None        Note:  This document was prepared  using Dragon voice recognition software and may include unintentional dictation errors.   Nathaniel Man, MD 11/20/22 1555

## 2022-11-20 NOTE — ED Notes (Signed)
US at bedside

## 2022-11-20 NOTE — ED Notes (Signed)
D/C, f/up and reasons to return discussed with pt and husband at this time. NAD noted. Pt ambulatory with steady gait on D/C.

## 2022-11-20 NOTE — Discharge Instructions (Signed)
You are seen in the emergency department for elevated liver function tests that were done with your primary care provider.  Your repeat liver function test today were closer to your normal.  You had an ultrasound of your liver and gallbladder that showed signs of fatty liver disease.  It is importantly follow-up closely with your primary care physician.  Some of your home medications including amiodarone can cause long-term issues with your liver, discussed some of your home medications with your primary care physician and your cardiologist.  Return to the emergency department if you have any worsening symptoms.

## 2022-11-20 NOTE — ED Notes (Signed)
Pt presents to ED RM 9 ambulatory with steady gait with NAD noted. Pt states she had a fall this past week and states went to PCP to get checked out and states she was told to come here, pt states HX of fatty liver nut denies ETOH use. Pt denies pain or any recent illness. Pt is speaking in full sentences with NAD noted. VSS.   Pt denies blood thinner use but does endorse taking a daily 81 mg ASA.   Husband with pt and states pt has been acting her normal self.

## 2022-11-20 NOTE — ED Triage Notes (Signed)
Pt reports that she was dizzy last week and fell and was seen by her dr who did labwork, pt was called and told that her liver enzymes are elevated, pt states that she feels alright now like always

## 2022-11-21 ENCOUNTER — Ambulatory Visit (INDEPENDENT_AMBULATORY_CARE_PROVIDER_SITE_OTHER): Payer: Medicare Other | Admitting: Nurse Practitioner

## 2022-11-21 ENCOUNTER — Encounter: Payer: Self-pay | Admitting: Nurse Practitioner

## 2022-11-21 VITALS — BP 112/60 | HR 75 | Temp 97.7°F | Wt 208.9 lb

## 2022-11-21 DIAGNOSIS — R7989 Other specified abnormal findings of blood chemistry: Secondary | ICD-10-CM | POA: Diagnosis not present

## 2022-11-21 NOTE — Progress Notes (Signed)
BP 112/60   Pulse 75   Temp 97.7 F (36.5 C) (Oral)   Wt 208 lb 14.4 oz (94.8 kg)   SpO2 96%   BMI 38.21 kg/m    Subjective:    Patient ID: Kim Buchanan, female    DOB: [birth year], 72 y.o.   MRN: [id number]  HPI: Kim Buchanan is a 72 y.o. female  Chief Complaint  Patient presents with   ER Follow Up    Pt states she was seen at the ER yesterday due to elevated liver enzymes. States she would like to discuss her metformin and amiodarone as she was told these could be contributing to the elevation   Patient states she was seen in the ER due to elevated liver enzymes.  She is concerned that her metformin/ amiodarone are causing her elevated liver enzymes.  She had fallen when she first came to see another provider and had numbness and tingling all over her body.  She was nauseous.  The numbness and tingling have improved and the nausea has also improved.     Relevant past medical, surgical, family and social history reviewed and updated as indicated. Interim medical history since our last visit reviewed. Allergies and medications reviewed and updated.  Review of Systems  Gastrointestinal:  Negative for nausea.  Neurological:  Negative for numbness.    Per HPI unless specifically indicated above     Objective:    BP 112/60   Pulse 75   Temp 97.7 F (36.5 C) (Oral)   Wt 208 lb 14.4 oz (94.8 kg)   SpO2 96%   BMI 38.21 kg/m   Wt Readings from Last 3 Encounters:  11/21/22 208 lb 14.4 oz (94.8 kg)  11/20/22 214 lb (97.1 kg)  11/16/22 215 lb 12.8 oz (97.9 kg)    Physical Exam Vitals and nursing note reviewed.  Constitutional:      General: She is not in acute distress.    Appearance: Normal appearance. She is normal weight. She is not ill-appearing, toxic-appearing or diaphoretic.  HENT:     Head: Normocephalic.     Right Ear: External ear normal.     Left Ear: External ear normal.     Nose: Nose normal.     Mouth/Throat:     Mouth: Mucous membranes are  moist.     Pharynx: Oropharynx is clear.  Eyes:     General:        Right eye: No discharge.        Left eye: No discharge.     Extraocular Movements: Extraocular movements intact.     Conjunctiva/sclera: Conjunctivae normal.     Pupils: Pupils are equal, round, and reactive to light.  Cardiovascular:     Rate and Rhythm: Normal rate and regular rhythm.     Heart sounds: No murmur heard. Pulmonary:     Effort: Pulmonary effort is normal. No respiratory distress.     Breath sounds: Normal breath sounds. No wheezing or rales.  Abdominal:     General: Abdomen is flat. Bowel sounds are normal. There is no distension.     Palpations: Abdomen is soft.     Tenderness: There is no abdominal tenderness. There is no right CVA tenderness, left CVA tenderness or guarding.  Musculoskeletal:     Cervical back: Normal range of motion and neck supple.  Skin:    General: Skin is warm and dry.     Capillary Refill: Capillary refill takes less than 2 seconds.  Neurological:     General: No focal deficit present.     Mental Status: She is alert and oriented to person, place, and time. Mental status is at baseline.  Psychiatric:        Mood and Affect: Mood normal.        Behavior: Behavior normal.        Thought Content: Thought content normal.        Judgment: Judgment normal.     Results for orders placed or performed during the hospital encounter of 11/20/22  Comprehensive metabolic panel  Result Value Ref Range   Sodium 137 135 - 145 mmol/L   Potassium 3.9 3.5 - 5.1 mmol/L   Chloride 101 98 - 111 mmol/L   CO2 24 22 - 32 mmol/L   Glucose, Bld 247 (H) 70 - 99 mg/dL   BUN 28 (H) 8 - 23 mg/dL   Creatinine, Ser 0.98 0.44 - 1.00 mg/dL   Calcium 9.6 8.9 - 10.3 mg/dL   Total Protein 8.1 6.5 - 8.1 g/dL   Albumin 3.6 3.5 - 5.0 g/dL   AST 173 (H) 15 - 41 U/L   ALT 318 (H) 0 - 44 U/L   Alkaline Phosphatase 205 (H) 38 - 126 U/L   Total Bilirubin 1.2 0.3 - 1.2 mg/dL   GFR, Estimated >60 >60  mL/min   Anion gap 12 5 - 15  Lipase, blood  Result Value Ref Range   Lipase 54 (H) 11 - 51 U/L  CBC with Diff  Result Value Ref Range   WBC 6.7 4.0 - 10.5 K/uL   RBC 4.27 3.87 - 5.11 MIL/uL   Hemoglobin 12.4 12.0 - 15.0 g/dL   HCT 38.7 36.0 - 46.0 %   MCV 90.6 80.0 - 100.0 fL   MCH 29.0 26.0 - 34.0 pg   MCHC 32.0 30.0 - 36.0 g/dL   RDW 14.9 11.5 - 15.5 %   Platelets 310 150 - 400 K/uL   nRBC 0.0 0.0 - 0.2 %   Neutrophils Relative % 60 %   Neutro Abs 4.0 1.7 - 7.7 K/uL   Lymphocytes Relative 28 %   Lymphs Abs 1.9 0.7 - 4.0 K/uL   Monocytes Relative 7 %   Monocytes Absolute 0.5 0.1 - 1.0 K/uL   Eosinophils Relative 3 %   Eosinophils Absolute 0.2 0.0 - 0.5 K/uL   Basophils Relative 1 %   Basophils Absolute 0.0 0.0 - 0.1 K/uL   Immature Granulocytes 1 %   Abs Immature Granulocytes 0.03 0.00 - 0.07 K/uL  Lactic acid, plasma  Result Value Ref Range   Lactic Acid, Venous 3.6 (HH) 0.5 - 1.9 mmol/L  Lactic acid, plasma  Result Value Ref Range   Lactic Acid, Venous 3.3 (HH) 0.5 - 1.9 mmol/L  Hepatitis panel, acute  Result Value Ref Range   Hepatitis B Surface Ag NON REACTIVE NON REACTIVE   HCV Ab Reactive (A) NON REACTIVE   Hep A IgM NON REACTIVE NON REACTIVE   Hep B C IgM NON REACTIVE NON REACTIVE  Protime-INR  Result Value Ref Range   Prothrombin Time 15.1 11.4 - 15.2 seconds   INR 1.2 0.8 - 1.2  Digoxin level  Result Value Ref Range   Digoxin Level 1.0 0.8 - 2.0 ng/mL  CBG monitoring, ED  Result Value Ref Range   Glucose-Capillary 242 (H) 70 - 99 mg/dL      Assessment & Plan:   Problem List Items Addressed This Visit  None Visit Diagnoses     Elevated LFTs    -  Primary   Likely related to viral illness.  Symptoms have improved. Can consider stopping metformin if LFTs still elevated.  Will await labs for further decision making.   Relevant Orders   Comp Met (CMET)        Follow up plan: Return for Keep scheduled follow up.

## 2022-11-22 ENCOUNTER — Other Ambulatory Visit: Payer: Self-pay | Admitting: Nurse Practitioner

## 2022-11-22 ENCOUNTER — Other Ambulatory Visit (HOSPITAL_COMMUNITY): Payer: Self-pay | Admitting: Cardiology

## 2022-11-22 DIAGNOSIS — R7989 Other specified abnormal findings of blood chemistry: Secondary | ICD-10-CM

## 2022-11-22 LAB — COMPREHENSIVE METABOLIC PANEL
ALT: 257 IU/L — ABNORMAL HIGH (ref 0–32)
AST: 139 IU/L — ABNORMAL HIGH (ref 0–40)
Albumin/Globulin Ratio: 1.4 (ref 1.2–2.2)
Albumin: 4.2 g/dL (ref 3.9–4.9)
Alkaline Phosphatase: 247 IU/L — ABNORMAL HIGH (ref 44–121)
BUN/Creatinine Ratio: 22 (ref 12–28)
BUN: 26 mg/dL (ref 8–27)
Bilirubin Total: 0.9 mg/dL (ref 0.0–1.2)
CO2: 20 mmol/L (ref 20–29)
Calcium: 9.5 mg/dL (ref 8.7–10.3)
Chloride: 100 mmol/L (ref 96–106)
Creatinine, Ser: 1.18 mg/dL — ABNORMAL HIGH (ref 0.57–1.00)
Globulin, Total: 3 g/dL (ref 1.5–4.5)
Glucose: 213 mg/dL — ABNORMAL HIGH (ref 70–99)
Potassium: 4.6 mmol/L (ref 3.5–5.2)
Sodium: 142 mmol/L (ref 134–144)
Total Protein: 7.2 g/dL (ref 6.0–8.5)
eGFR: 50 mL/min/{1.73_m2} — ABNORMAL LOW (ref >59)

## 2022-11-22 NOTE — Addendum Note (Signed)
Addended by: [name] on: 11/22/2022 07:58 AM   Modules accepted: Orders

## 2022-11-22 NOTE — Progress Notes (Signed)
Hep C quantitative added on.

## 2022-11-22 NOTE — Progress Notes (Signed)
Please let patient know that her liver enzymes are continuing to trend down which is great news.  However, it does look like she is still dehydrated.  Please make sure she is drinking at least 64 ounces of water daily.  I would like her to come back in 1 week and repeat the labs. I placed the order.

## 2022-11-28 ENCOUNTER — Telehealth: Payer: Self-pay

## 2022-11-28 NOTE — Telephone Encounter (Signed)
     Patient  visit on 11/20/2022  at [facility] was for Abnormal Lab, Elevated LFTs.  Have you been able to follow up with your primary care physician? Yes  The patient was or was not able to obtain any needed medicine or equipment. No medication prescribed.  Are there diet recommendations that you are having difficulty following? No  Patient expresses understanding of discharge instructions and education provided has no other needs at this time. Yes   [place] Resource Care Guide   [email or web address]  ?? [phone]   Website: [email or web address]  [email or web address]

## 2022-11-29 ENCOUNTER — Ambulatory Visit (INDEPENDENT_AMBULATORY_CARE_PROVIDER_SITE_OTHER): Payer: Medicare Other | Admitting: Nurse Practitioner

## 2022-11-29 ENCOUNTER — Encounter: Payer: Self-pay | Admitting: Nurse Practitioner

## 2022-11-29 VITALS — BP 103/63 | HR 83 | Temp 98.0°F | Wt 214.7 lb

## 2022-11-29 DIAGNOSIS — F331 Major depressive disorder, recurrent, moderate: Secondary | ICD-10-CM

## 2022-11-29 DIAGNOSIS — I152 Hypertension secondary to endocrine disorders: Secondary | ICD-10-CM | POA: Diagnosis not present

## 2022-11-29 DIAGNOSIS — I5020 Unspecified systolic (congestive) heart failure: Secondary | ICD-10-CM

## 2022-11-29 DIAGNOSIS — E785 Hyperlipidemia, unspecified: Secondary | ICD-10-CM | POA: Diagnosis not present

## 2022-11-29 DIAGNOSIS — I25118 Atherosclerotic heart disease of native coronary artery with other forms of angina pectoris: Secondary | ICD-10-CM

## 2022-11-29 DIAGNOSIS — G4733 Obstructive sleep apnea (adult) (pediatric): Secondary | ICD-10-CM | POA: Diagnosis not present

## 2022-11-29 DIAGNOSIS — E1169 Type 2 diabetes mellitus with other specified complication: Secondary | ICD-10-CM | POA: Diagnosis not present

## 2022-11-29 DIAGNOSIS — I700 Atherosclerosis of aorta: Secondary | ICD-10-CM | POA: Diagnosis not present

## 2022-11-29 DIAGNOSIS — E1142 Type 2 diabetes mellitus with diabetic polyneuropathy: Secondary | ICD-10-CM | POA: Diagnosis not present

## 2022-11-29 DIAGNOSIS — R1013 Epigastric pain: Secondary | ICD-10-CM

## 2022-11-29 DIAGNOSIS — E1159 Type 2 diabetes mellitus with other circulatory complications: Secondary | ICD-10-CM | POA: Diagnosis not present

## 2022-11-29 DIAGNOSIS — J410 Simple chronic bronchitis: Secondary | ICD-10-CM | POA: Diagnosis not present

## 2022-11-29 NOTE — Assessment & Plan Note (Signed)
Recommended eating smaller high protein, low fat meals more frequently and exercising 30 mins a day 5 times a week with a goal of 10-15lb weight loss in the next 3 months.  

## 2022-11-29 NOTE — Progress Notes (Addendum)
BP 103/63   Pulse 83   Temp 98 F (36.7 C) (Oral)   Wt 214 lb 11.2 oz (97.4 kg)   SpO2 94%   BMI 39.27 kg/m    Subjective:    Patient ID: Kim Buchanan, female    DOB: May 25, 1951, 72 y.o.   MRN: [id number]  HPI: [name] is a 72 y.o. female  Chief Complaint  Patient presents with   Diabetes   Hyperlipidemia   Hypertension    HYPERTENSION / [place] Satisfied with current treatment? yes Duration of hypertension: years BP monitoring frequency: a few times a month BP range: 120/80 BP medication side effects: no Past BP meds:  metoprolol and valsartan Duration of hyperlipidemia: years Cholesterol medication side effects: no Cholesterol supplements: none Past cholesterol medications: atorvastain (lipitor) Medication compliance: excellent compliance Aspirin: yes Recent stressors: no Recurrent headaches: no Visual changes: no Palpitations: no Dyspnea: yes Chest pain: no Lower extremity edema: no Dizzy/lightheaded: no  DIABETES Hypoglycemic episodes:no Polydipsia/polyuria: no Visual disturbance: no Chest pain: no Paresthesias: no Glucose Monitoring: yes  Accucheck frequency: in the morning  Fasting glucose: 200s  Post prandial:  Evening:  Before meals: Taking Insulin?: yes  Long acting insulin: Has gotten up to 80u  Short acting insulin: Humalog 5 TID Blood Pressure Monitoring: a few times a month Retinal Examination: Up to Date Foot Exam: Up to Date Diabetic Education: Not Completed Pneumovax: Up to Date Influenza: Up to Date Aspirin: yes  Patient states she is having some abdominal pain that started yesterday.  States it is numb.  If she pushes on the area she is having pain.  She describes the pain as achy.  No changes in bowel movements, no vomiting or nausea.   Relevant past medical, surgical, family and social history reviewed and updated as indicated. Interim medical history since our last visit reviewed. Allergies and  medications reviewed and updated.  Review of Systems  Eyes:  Negative for visual disturbance.  Respiratory:  Positive for shortness of breath. Negative for cough and chest tightness.   Cardiovascular:  Negative for chest pain, palpitations and leg swelling.  Gastrointestinal:  Positive for abdominal pain.  Neurological:  Negative for dizziness and headaches.  Psychiatric/Behavioral:  Positive for sleep disturbance.     Per HPI unless specifically indicated above     Objective:    BP 103/63   Pulse 83   Temp 98 F (36.7 C) (Oral)   Wt 214 lb 11.2 oz (97.4 kg)   SpO2 94%   BMI 39.27 kg/m   Wt Readings from Last 3 Encounters:  11/29/22 214 lb 11.2 oz (97.4 kg)  11/21/22 208 lb 14.4 oz (94.8 kg)  11/20/22 214 lb (97.1 kg)    Physical Exam Vitals and nursing note reviewed.  Constitutional:      General: She is not in acute distress.    Appearance: Normal appearance. She is obese. She is not ill-appearing, toxic-appearing or diaphoretic.  HENT:     Head: Normocephalic.     Right Ear: External ear normal.     Left Ear: External ear normal.     Nose: Nose normal.     Mouth/Throat:     Mouth: Mucous membranes are moist.     Pharynx: Oropharynx is clear.  Eyes:     General:        Right eye: No discharge.        Left eye: No discharge.     Extraocular Movements: Extraocular movements intact.  Conjunctiva/sclera: Conjunctivae normal.     Pupils: Pupils are equal, round, and reactive to light.  Cardiovascular:     Rate and Rhythm: Normal rate and regular rhythm.     Heart sounds: No murmur heard. Pulmonary:     Effort: Pulmonary effort is normal. No respiratory distress.     Breath sounds: Normal breath sounds. No wheezing or rales.  Abdominal:     General: Abdomen is flat. Bowel sounds are normal.     Palpations: Abdomen is soft.     Tenderness: There is abdominal tenderness in the right upper quadrant, epigastric area and left upper quadrant.  Musculoskeletal:      Cervical back: Normal range of motion and neck supple.  Skin:    General: Skin is warm and dry.     Capillary Refill: Capillary refill takes less than 2 seconds.  Neurological:     General: No focal deficit present.     Mental Status: She is alert and oriented to person, place, and time. Mental status is at baseline.  Psychiatric:        Mood and Affect: Mood normal.        Behavior: Behavior normal.        Thought Content: Thought content normal.        Judgment: Judgment normal.     Results for orders placed or performed in visit on 11/21/22  Comp Met (CMET)  Result Value Ref Range   Glucose 213 (H) 70 - 99 mg/dL   BUN 26 8 - 27 mg/dL   Creatinine, Ser 1.18 (H) 0.57 - 1.00 mg/dL   eGFR 50 (L) >59 mL/min/1.73   BUN/Creatinine Ratio 22 12 - 28   Sodium 142 134 - 144 mmol/L   Potassium 4.6 3.5 - 5.2 mmol/L   Chloride 100 96 - 106 mmol/L   CO2 20 20 - 29 mmol/L   Calcium 9.5 8.7 - 10.3 mg/dL   Total Protein 7.2 6.0 - 8.5 g/dL   Albumin 4.2 3.9 - 4.9 g/dL   Globulin, Total 3.0 1.5 - 4.5 g/dL   Albumin/Globulin Ratio 1.4 1.2 - 2.2   Bilirubin Total 0.9 0.0 - 1.2 mg/dL   Alkaline Phosphatase 247 (H) 44 - 121 IU/L   AST 139 (H) 0 - 40 IU/L   ALT 257 (H) 0 - 32 IU/L      Assessment & Plan:   Problem List Items Addressed This Visit       Cardiovascular and Mediastinum   Hypertension associated with diabetes ([place])    Chronic.  Controlled.  Continue with current medication regimen.  Labs ordered today.  Return to clinic in 3 months for reevaluation.  Call sooner if concerns arise.        Relevant Orders   Comp Met (CMET)   CAD (coronary artery disease), native coronary artery    Chronic. Ongoing.  Continues to follow up with Cardiology and HF clinic.  Continue per their recommendations.  Continue with ASA and Atorvastatin.      Aortic atherosclerosis ([place]) - Primary    Observed on CT in 2021.  Continue with ASA and Atorvastatin.        HFrEF (heart failure with  reduced ejection fraction) (HCC)    Chronic. Ongoing.  Patient restarted medications after seeing HF clinic. See[facility] in [place].  Has not seen them since September.  Reviewed note which indicated she was due to see them in November.  Encouraged patient to make an appt.   Continues  to follow up with Cardiology.  Continue per their recommendations.  Continue with ASA and Atorvastatin. - Reminded to call for an overnight weight gain of >2 pounds or a weekly weight gain of >5 pounds - not adding salt to food and read food labels. Reviewed the importance of keeping daily sodium intake to '2000mg'$  daily. - Avoid Ibuprofen products.         Respiratory   COPD (chronic obstructive pulmonary disease) (HCC)    Chronic.  Controlled.  Continue with current medication regimen.  Labs ordered today.  Return to clinic in 3 months for reevaluation.  Call sooner if concerns arise.        OSA (obstructive sleep apnea)    No current CPAP due to cost, continue to work with CCM team.        Endocrine   Hyperlipidemia associated with type 2 diabetes mellitus ([place])    Chronic.  Controlled.  Continue with current medication regimen on Atorvastatin '10mg'$ .  Labs ordered today.  Return to clinic in 3 months for reevaluation.  Call sooner if concerns arise.        Relevant Orders   Lipid Profile   Type 2 diabetes mellitus with peripheral neuropathy (HCC)    Chronic. Not well controlled.  Last A1c was 8.1 in November.  On Lantus 80u nightly and Humalog '5mg'$  TID.  She is also taking Glimeride and Metformin.  Labs ordered today.  Sugars have been running >200.  Follow up in 3 months.  Call sooner if concerns arise.      Relevant Orders   HgB A1c     Other   Morbid obesity (HCC)    Recommended eating smaller high protein, low fat meals more frequently and exercising 30 mins a day 5 times a week with a goal of 10-15lb weight loss in the next 3 months.       Moderate episode of recurrent major  depressive disorder (HCC)    Chronic.  Controlled.  Continue with current medication regimen.  Labs ordered today.  Return to clinic in 3 months for reevaluation.  Call sooner if concerns arise.        Other Visit Diagnoses     Epigastric abdominal pain       Started yesterday.  No red flags on exam.  Will order [place] to evaluate pain.   Relevant Orders   US Abdomen Limited RUQ (LIVER/GB)        Follow up plan: Return in about 3 months (around 02/27/2023) for HTN, HLD, DM2 FU.

## 2022-11-29 NOTE — Assessment & Plan Note (Signed)
Chronic.  Controlled.  Continue with current medication regimen.  Labs ordered today.  Return to clinic in 3 months for reevaluation.  Call sooner if concerns arise.   

## 2022-11-29 NOTE — Assessment & Plan Note (Signed)
Observed on CT in 2021.  Continue with ASA and Atorvastatin.

## 2022-11-29 NOTE — Assessment & Plan Note (Signed)
Chronic. Not well controlled.  Last A1c was 8.1 in November.  On Lantus 80u nightly and Humalog '5mg'$  TID.  She is also taking Glimeride and Metformin.  Labs ordered today.  Sugars have been running >200.  Follow up in 3 months.  Call sooner if concerns arise.

## 2022-11-29 NOTE — Assessment & Plan Note (Signed)
Chronic. Ongoing.  Continues to follow up with Cardiology and HF clinic.  Continue per their recommendations.  Continue with ASA and Atorvastatin.

## 2022-11-29 NOTE — Assessment & Plan Note (Addendum)
Chronic. Ongoing.  Patient restarted medications after seeing HF clinic. See's [facility] in [place].  Has not seen them since September.  Reviewed note which indicated she was due to see them in November.  Encouraged patient to make an appt.   Continues to follow up with Cardiology.  Continue per their recommendations.  Continue with ASA and Atorvastatin. - Reminded to call for an overnight weight gain of >2 pounds or a weekly weight gain of >5 pounds - not adding salt to food and read food labels. Reviewed the importance of keeping daily sodium intake to '2000mg'$  daily. - Avoid Ibuprofen products.

## 2022-11-29 NOTE — Assessment & Plan Note (Signed)
Chronic.  Controlled.  Continue with current medication regimen on Atorvastatin '10mg'$ .  Labs ordered today.  Return to clinic in 3 months for reevaluation.  Call sooner if concerns arise.

## 2022-11-29 NOTE — Assessment & Plan Note (Signed)
No current CPAP due to cost, continue to work with C[facility] team.

## 2022-11-30 LAB — LIPID PANEL
Chol/HDL Ratio: 2.4 ratio (ref 0.0–4.4)
Cholesterol, Total: 80 mg/dL — ABNORMAL LOW (ref 100–199)
HDL: 33 mg/dL — ABNORMAL LOW (ref >39)
LDL Chol Calc (NIH): 19 mg/dL (ref 0–99)
Triglycerides: 173 mg/dL — ABNORMAL HIGH (ref 0–149)
VLDL Cholesterol Cal: 28 mg/dL (ref 5–40)

## 2022-11-30 LAB — COMPREHENSIVE METABOLIC PANEL
ALT: 84 IU/L — ABNORMAL HIGH (ref 0–32)
AST: 114 IU/L — ABNORMAL HIGH (ref 0–40)
Albumin/Globulin Ratio: 1.4 (ref 1.2–2.2)
Albumin: 4.2 g/dL (ref 3.9–4.9)
Alkaline Phosphatase: 198 IU/L — ABNORMAL HIGH (ref 44–121)
BUN/Creatinine Ratio: 21 (ref 12–28)
BUN: 17 mg/dL (ref 8–27)
Bilirubin Total: 0.8 mg/dL (ref 0.0–1.2)
CO2: 21 mmol/L (ref 20–29)
Calcium: 10.2 mg/dL (ref 8.7–10.3)
Chloride: 99 mmol/L (ref 96–106)
Creatinine, Ser: 0.8 mg/dL (ref 0.57–1.00)
Globulin, Total: 2.9 g/dL (ref 1.5–4.5)
Glucose: 218 mg/dL — ABNORMAL HIGH (ref 70–99)
Potassium: 4.1 mmol/L (ref 3.5–5.2)
Sodium: 142 mmol/L (ref 134–144)
Total Protein: 7.1 g/dL (ref 6.0–8.5)
eGFR: 80 mL/min/{1.73_m2} (ref >59)

## 2022-11-30 LAB — HEMOGLOBIN A1C
Est. average glucose Bld gHb Est-mCnc: 226 mg/dL
Hgb A1c MFr Bld: 9.5 % — ABNORMAL HIGH (ref 4.8–5.6)

## 2022-11-30 NOTE — Progress Notes (Signed)
Please let patient know that her lab work shows that her liver enzymes are improved and back to her normal range.  We will continue with the metformin.  Her A1c did increase again back to 9.5.  I would like her to increase her meal time insulin to 7u with each meal.  Continue with other medications.  Follow up in as discussed.

## 2022-12-10 ENCOUNTER — Ambulatory Visit (INDEPENDENT_AMBULATORY_CARE_PROVIDER_SITE_OTHER): Payer: Medicare Other

## 2022-12-10 VITALS — Ht 62.0 in | Wt 214.0 lb

## 2022-12-10 DIAGNOSIS — Z0000 Encounter for general adult medical examination without abnormal findings: Secondary | ICD-10-CM

## 2022-12-10 NOTE — Progress Notes (Signed)
I connected with  Kim Buchanan on 12/10/22 by a audio enabled telemedicine application and verified that I am speaking with the correct person using two identifiers.  Patient Location: Home  Provider Location: Home Office  I discussed the limitations of evaluation and management by telemedicine. The patient expressed understanding and agreed to proceed.  Subjective:   Kim Buchanan is a 72 y.o. female who presents for Medicare Annual (Subsequent) preventive examination.  Review of Systems     Cardiac Risk Factors include: advanced age (>40mn, >>1women);sedentary lifestyle;diabetes mellitus;hypertension;obesity (BMI >30kg/m2)     Objective:    There were no vitals filed for this visit. There is no height or weight on file to calculate BMI.     12/10/2022   10:36 AM 11/20/2022    9:50 AM 01/04/2022   10:59 AM 03/21/2021    9:44 AM 12/02/2020    9:02 AM 11/30/2019   10:12 AM 09/11/2019    7:33 AM  Advanced Directives  Does Patient Have a Medical Advance Directive? Yes Yes No No Yes Yes Yes  Type of AParamedicof APopponessetLiving will    [place] will [place] will   Does patient want to make changes to medical advance directive? No - Patient declined      No - Patient declined  Copy of [place] Chart? Yes - validated most recent copy scanned in chart (See row information)    No - copy requested No - copy requested   Would patient like information on creating a medical advance directive? No - Patient declined No - Patient declined         Current Medications (verified) Outpatient Encounter Medications as of 12/10/2022  Medication Sig   amiodarone (PACERONE) 200 MG tablet Take 0.5 tablets (100 mg total) by mouth daily.   aspirin 81 MG tablet Take 81 mg by mouth daily.    atorvastatin (LIPITOR) 10 MG tablet Take 1 tablet (10 mg total) by mouth at bedtime.   Continuous Blood Gluc  Receiver (FREESTYLE LIBRE 2 READER) DEVI 1 each by Does not apply route daily.   Continuous Blood Gluc Sensor (FREESTYLE LIBRE 2 SENSOR) MISC 1 each by Does not apply route every 14 (fourteen) days.   digoxin (LANOXIN) 0.125 MG tablet Take 1 tablet (0.125 mg total) by mouth daily.   Dulaglutide (TRULICITY) 4.5 [id number] Inject 4.5 mg as directed once a week.   gabapentin (NEURONTIN) 300 MG capsule Take 1 capsule (300 mg total) by mouth 3 (three) times daily.   glimepiride (AMARYL) 4 MG tablet Take 1 tablet (4 mg total) by mouth daily with breakfast.   insulin detemir (LEVEMIR) 100 UNIT/ML injection Inject 0.8 mLs (80 Units total) into the skin at bedtime.   insulin lispro (HUMALOG KWIKPEN) 100 UNIT/ML KwikPen Inject 5 Units into the skin 3 (three) times daily.   Insulin Pen Needle 30G X 6 MM MISC 1 Application by Does not apply route daily.   Lancets (ONETOUCH DELICA PLUS [id number] MISC TEST ONCE DAILY   metFORMIN (GLUCOPHAGE) 1000 MG tablet TAKE 1 TABLET BY MOUTH TWICE  DAILY WITH MEALS   metoprolol succinate (TOPROL-XL) 50 MG 24 hr tablet Take 1 tablet (50 mg total) by mouth daily.   nitroGLYCERIN (NITROSTAT) 0.3 MG SL tablet Place 1 tablet (0.3 mg total) under the tongue every 5 (five) minutes as needed for chest pain.   ONETOUCH ULTRA test strip TEST ONCE DAILY  pantoprazole (PROTONIX) 40 MG tablet TAKE 1 TABLET BY MOUTH  DAILY   polyethylene glycol powder (GLYCOLAX/MIRALAX) 17 GM/SCOOP powder Take 17 g by mouth daily.   QUEtiapine (SEROQUEL) 200 MG tablet TAKE 1 TABLET BY MOUTH DAILY AT  BEDTIME   spironolactone (ALDACTONE) 25 MG tablet TAKE 1 TABLET BY MOUTH DAILY   torsemide (DEMADEX) 20 MG tablet Patient takes 1 tablet by mouth one day then 2 tablets by mouth every other day.   valsartan (DIOVAN) 40 MG tablet TAKE 1 AND 1/2 TABLETS BY MOUTH  TWICE DAILY   No facility-administered encounter medications on file as of 12/10/2022.    Allergies (verified) Entresto  [sacubitril-valsartan] and Lisinopril   History: Past Medical History:  Diagnosis Date   Anxiety    Arthritis    lower back   COPD (chronic obstructive pulmonary disease) ([place])    Depression    Diabetes mellitus without complication (HCC)    GERD (gastroesophageal reflux disease)    Hyperlipidemia    Morbid obesity ([place])    NICM (nonischemic cardiomyopathy) ([place])    a. 03/2019 Echo: Nl LVEF; b. 09/04/2019 Echo: EF 25-30%; c. 09/11/2019 Cath: nonobs LAD/RCA dzs. EF 50-55%; c. 12/2020 Echo: EF 30-35%, glob HK. Mild LVH. Nl RVSP. Mild MR; d. 02/2021 RHC: Elev RH pressures. CO/CI 2.8/1.3; e. 06/2021 cMRI: EF 40%, marked sept-lat dyssynch & septal HK. Small, discrete areas of mid-wall LGE in apical lateral and apical septal walls - ? h/o myocarditis.   Nonobstructive CAD (coronary artery disease)    a. 03/2019 [facility]): EF 63%, ant ischemia; b. 08/2019 Cath: LM nl, LAD 51m RCA 70p (iFR nl @ 0.97), 50p. EF 50-55%-->Med Rx; c. 06/2020 Cor CTA: Nl FFRs throughout cor tree.   Osteoporosis    Panic attack    PTSD (post-traumatic stress disorder)    PVC's (premature ventricular contractions)    a. 05/2021 Zio: 20.6% PVC burden. 13.4 secs of NSVT-->amio added; b. 06/2021 Zio: 5.8% PVC burden. Rare supraventrciular ectopy.   Rectal fistula    Sleep apnea    a. Prev wore CPAP but lost insurance and hasn't been using since (now has insurance).   Wears dentures    full upper   Past Surgical History:  Procedure Laterality Date   CARDIAC CATHETERIZATION     COLONOSCOPY  2014   COLONOSCOPY WITH PROPOFOL N/A 10/01/2016   Procedure: COLONOSCOPY WITH PROPOFOL;  Surgeon: DLucilla Lame MD;  [place] [facility]  Service: Endoscopy;  Laterality: N/A;   COLONOSCOPY WITH PROPOFOL N/A 01/04/2022   Procedure: COLONOSCOPY WITH PROPOFOL;  Surgeon: WLucilla Lame MD;  [facility]  Service: Endoscopy;  Laterality: N/A;   CORONARY PRESSURE WIRE/FFR WITH 3D MAPPING N/A 09/11/2019   Procedure:  Coronary Pressure Wire/FFR w/3D Mapping;  Surgeon: ENelva Bush MD;  [facility];  Service: Cardiovascular;  Laterality: N/A;   CYSTOCELE REPAIR N/A 12/17/2016   Procedure: ANTERIOR REPAIR (CYSTOCELE);  Surgeon: MBrayton Mars MD;  Location: [facility];  Service: Gynecology;  Laterality: N/A;   ESOPHAGOGASTRODUODENOSCOPY (EGD) WITH PROPOFOL N/A 08/11/2019   Procedure: ESOPHAGOGASTRODUODENOSCOPY (EGD) WITH PROPOFOL;  Surgeon: WLucilla Lame MD;  Location: [facility];  Service: Endoscopy;  Laterality: N/A;   POLYPECTOMY  10/01/2016   Procedure: POLYPECTOMY;  Surgeon: DLucilla Lame MD;  [place]: [facility]  Service: Endoscopy;;   RECTAL SURGERY  06/28/2015   Rectal prolapse, laparoscopic rectopexy the coldCharm[place], MD; [facility]  RIGHT HEART CATH N/A 03/21/2021   Procedure: RIGHT HEART CATH;  Surgeon: Nelva Bush, MD;  Location: [place] [facility];  Service: Cardiovascular;  Laterality: N/A;   RIGHT/LEFT HEART CATH AND CORONARY ANGIOGRAPHY Bilateral 09/11/2019   Procedure: RIGHT/LEFT HEART CATH AND CORONARY ANGIOGRAPHY;  Surgeon: [name], MD;  Location: [place] [facility];  Service: Cardiovascular;  Laterality: Bilateral;   TOTAL ABDOMINAL HYSTERECTOMY W/ BILATERAL SALPINGOOPHORECTOMY     TUBAL LIGATION     VAGINAL HYSTERECTOMY Bilateral 12/17/2016   Procedure: HYSTERECTOMY VAGINAL WITH BILATERAL SALPINGO OOPHERECTOMY;  Surgeon: Brayton Mars, MD;  Location: [facility];  Service: Gynecology;  Laterality: Bilateral;   Family History  Problem Relation Age of Onset   Diabetes Mother    Diabetes Sister    Cancer Sister    Diabetes Brother    Lymphoma Brother    Heart disease Father    Hypertension Father    Diabetes Sister    Diabetes Brother    Lymphoma Brother    Heart disease Brother    Healthy Sister    Breast cancer Neg Hx    Ovarian cancer Neg Hx    Colon cancer Neg Hx    Social History   Socioeconomic History    Marital status: Married    Spouse name: Paediatric nurse   Number of children: 1   Years of education: Not on file   Highest education level: 9th grade  Occupational History    Employer: DISABLED    Comment: for panic attacks  Tobacco Use   Smoking status: Former    Packs/day: 1.50    Years: 50.00    Total pack years: 75.00    Types: Cigarettes    Quit date: 11/29/2018    Years since quitting: 4.0   Smokeless tobacco: Never   Tobacco comments:    started at age 42  Vaping Use   Vaping Use: Never used  Substance and Sexual Activity   Alcohol use: No    Alcohol/week: 0.0 standard drinks of alcohol   Drug use: No   Sexual activity: Not Currently  Other Topics Concern   Not on file  Social History Narrative   Pt had one son, who died when was 56 YO in a MVA.   Social Determinants of Health   Financial Resource Strain: Low Risk  (12/10/2022)   Overall Financial Resource Strain (CARDIA)    Difficulty of Paying Living Expenses: Not hard at all  Recent Concern: Financial Resource Strain - High Risk (10/01/2022)   Overall Financial Resource Strain (CARDIA)    Difficulty of Paying Living Expenses: Very hard  Food Insecurity: No Food Insecurity (12/10/2022)   Hunger Vital Sign    Worried About Running Out of Food in the Last Year: Never true    Ran Out of Food in the Last Year: Never true  Transportation Needs: No Transportation Needs (12/10/2022)   PRAPARE - Hydrologist (Medical): No    Lack of Transportation (Non-Medical): No  Physical Activity: Inactive (12/10/2022)   Exercise Vital Sign    Days of Exercise per Week: 0 days    Minutes of Exercise per Session: 0 min  Stress: No Stress Concern Present (12/10/2022)   [place]    Feeling of Stress : Not at all  Social Connections: Moderately Isolated (12/10/2022)   Social Connection and Isolation Panel [NHANES]    Frequency of Communication with  Friends and Family: Twice a week    Frequency of Social Gatherings with Friends and  Family: Once a week    Attends Religious Services: Never    Marine scientist or Organizations: No    Attends Music therapist: Never    Marital Status: Married    Tobacco Counseling Counseling given: Not Answered Tobacco comments: started at age 65   Clinical Intake:  Pre-visit preparation completed: Yes  Pain : No/denies pain     Nutritional Risks: None Diabetes: Yes CBG done?: No Did pt. bring in CBG monitor from home?: No  How often do you need to have someone help you when you read instructions, pamphlets, or other written materials from your doctor or pharmacy?: 1 - Never  Diabetic?yes Nutrition Risk Assessment:  Has the patient had any N/V/D within the last 2 months?  No  Does the patient have any non-healing wounds?  No  Has the patient had any unintentional weight loss or weight gain?  No   Diabetes:  Is the patient diabetic?  Yes  If diabetic, was a CBG obtained today?  No  Did the patient bring in their glucometer from home?  No  How often do you monitor your CBG's? Every morning.   Financial Strains and Diabetes Management:  Are you having any financial strains with the device, your supplies or your medication? No .  Does the patient want to be seen by Chronic Care Management for management of their diabetes?  No  Would the patient like to be referred to a Nutritionist or for Diabetic Management?  No   Diabetic Exams:  Diabetic Eye Exam: Completed 02/08/22.  Pt has been advised about the importance in completing this exam.  Diabetic Foot Exam: Completed 08/14/22. Pt has been advised about the importance in completing this exam.   Interpreter Needed?: No  Information entered by :: Kirke Shaggy, LPN   Activities of Daily Living    12/10/2022   10:37 AM  In your present state of health, do you have any difficulty performing the following  activities:  Hearing? 0  Vision? 0  Difficulty concentrating or making decisions? 0  Walking or climbing stairs? 1  Dressing or bathing? 0  Doing errands, shopping? 0  Preparing Food and eating ? N  Using the Toilet? N  In the past six months, have you accidently leaked urine? N  Do you have problems with loss of bowel control? N  Managing your Medications? N  Managing your Finances? N  Housekeeping or managing your Housekeeping? N    Patient Care Team: [name], NP as PCP - General (Nurse Practitioner) [name], MD as PCP - Cardiology (Cardiology) [name], MD as PCP - Electrophysiology (Cardiology) [name], OD as Consulting Physician (Optometry) [name], MD as Consulting Physician (Pulmonary Disease) Lane Hacker, [facility] (Pharmacist)  Indicate any recent Medical Services you may have received from other than [facility] providers in the past year (date may be approximate).     Assessment:   This is a routine wellness examination for Vena.  Hearing/Vision screen Hearing Screening - Comments:: No aids Vision Screening - Comments:: Wears glasses- Dr.Woodard  Dietary issues and exercise activities discussed: Current Exercise Habits: The patient does not participate in regular exercise at present   Goals Addressed             This Visit's Progress    DIET - EAT MORE FRUITS AND VEGETABLES         Depression Screen    12/10/2022   10:34 AM 11/16/2022  1:31 PM 09/17/2022    3:24 PM 08/21/2022    3:00 PM 08/14/2022    8:05 AM 07/12/2022   11:27 AM 05/31/2022    9:33 AM  PHQ 2/9 Scores  PHQ - 2 Score 0 4 0 0 0  0  PHQ- 9 Score 0 18 0 0 0  0  Exception Documentation      Patient refusal     Fall Risk    12/10/2022   10:36 AM 11/16/2022    1:30 PM 09/17/2022    3:24 PM 08/21/2022    3:00 PM 08/14/2022    8:05 AM  Fall Risk   Falls in the past year? 1 1 0 0 0  Number falls in past yr: 1 1 0 0 0  Injury with Fall? 0  1 0 0 0  Risk for fall due to : History of fall(s)  No Fall Risks No Fall Risks No Fall Risks  Follow up Falls evaluation completed;Falls prevention discussed  Falls evaluation completed Falls evaluation completed Falls evaluation completed    FALL RISK PREVENTION PERTAINING TO THE HOME:  Any stairs in or around the home? No  If so, are there any without handrails? No  Home free of loose throw rugs in walkways, pet beds, electrical cords, etc? Yes  Adequate lighting in your home to reduce risk of falls? Yes   ASSISTIVE DEVICES UTILIZED TO PREVENT FALLS:  Life alert? No  Use of a cane, walker or w/c? No  Grab bars in the bathroom? No  Shower chair or bench in shower? Yes  Elevated toilet seat or a handicapped toilet? Yes   Cognitive Function:        12/10/2022   10:42 AM 12/02/2020    9:06 AM 10/03/2018    9:34 AM 05/13/2017    9:44 AM  6CIT Screen  What Year? 0 points 0 points 0 points 0 points  What month? 0 points 0 points 0 points 0 points  What time? 3 points 0 points 0 points 0 points  Count back from 20 0 points 0 points 0 points 0 points  Months in reverse 0 points 0 points 0 points 0 points  Repeat phrase 2 points 0 points 0 points 2 points  Total Score 5 points 0 points 0 points 2 points    Immunizations Immunization History  Administered Date(s) Administered   Fluad Quad(high Dose 65+) 07/26/2021, 07/05/2022   Hepatitis A, Adult 02/05/2022, 03/08/2022, 08/09/2022   Influenza, High Dose Seasonal PF 06/16/2019, 07/04/2020   Influenza, Seasonal, Injecte, Preservative Fre 08/18/2008, 07/14/2014   Influenza,inj,Quad PF,6+ Mos 09/25/2013, 08/05/2015, 08/02/2016, 07/24/2017   Moderna Sars-Covid-2 Vaccination 12/02/2019, 12/30/2019, 10/29/2020   Pneumococcal Conjugate-13 03/13/2018   Pneumococcal Polysaccharide-23 08/06/2019   Tdap 08/08/2009, 05/13/2014    TDAP status: Up to date  Flu Vaccine status: Up to date  Pneumococcal vaccine status: Up to  date  Covid-19 vaccine status: Completed vaccines  Qualifies for Shingles Vaccine? Yes   Zostavax completed No   Shingrix Completed?: No.    Education has been provided regarding the importance of this vaccine. Patient has been advised to call insurance company to determine out of pocket expense if they have not yet received this vaccine. Advised may also receive vaccine at local pharmacy or Health Dept. Verbalized acceptance and understanding.  Screening Tests Health Maintenance  Topic Date Due   Zoster Vaccines- Shingrix (1 of 2) Never done   COVID-19 Vaccine (4 - 2023-24 season) 06/22/2022   MAMMOGRAM  01/31/2023   OPHTHALMOLOGY EXAM  02/09/2023   HEMOGLOBIN A1C  05/30/2023   FOOT EXAM  08/15/2023   Diabetic kidney evaluation - Urine ACR  09/18/2023   Lung Cancer Screening  11/16/2023   Diabetic kidney evaluation - eGFR measurement  11/30/2023   Medicare Annual Wellness (AWV)  12/11/2023   DTaP/Tdap/Td (3 - Td or Tdap) 05/13/2024   COLONOSCOPY (Pts 45-25yr Insurance coverage will need to be confirmed)  01/05/2027   Pneumonia Vaccine 70 Years old  Completed   INFLUENZA VACCINE  Completed   DEXA SCAN  Completed   Hepatitis C Screening  Completed   HPV VACCINES  Aged Out    Health Maintenance  Health Maintenance Due  Topic Date Due   Zoster Vaccines- Shingrix (1 of 2) Never done   COVID-19 Vaccine (4 - 2023-24 season) 06/22/2022    Colorectal cancer screening: Type of screening: Colonoscopy. Completed 01/04/22. Repeat every 5 years  Mammogram status: Completed 01/30/22. Repeat every year  Bone Density status: Completed 03/02/20. Results reflect: Bone density results: NORMAL. Repeat every 5 years.  Lung Cancer Screening: (Low Dose CT Chest recommended if Age 324-80years, 30 pack-year currently smoking OR have quit w/in 15years.) does not qualify.    Additional Screening:  Hepatitis C Screening: does qualify; Completed 11/20/22  Vision Screening: Recommended annual  ophthalmology exams for early detection of glaucoma and other disorders of the eye. Is the patient up to date with their annual eye exam?  Yes  Who is the provider or what is the name of the office in which the patient attends annual eye exams? Dr.Woodard If pt is not established with a provider, would they like to be referred to a provider to establish care? No .   Dental Screening: Recommended annual dental exams for proper oral hygiene  Community Resource Referral / Chronic Care Management: CRR required this visit?  No   CCM required this visit?  No      Plan:     I have personally reviewed and noted the following in the patient's chart:   Medical and social history Use of alcohol, tobacco or illicit drugs  Current medications and supplements including opioid prescriptions. Patient is not currently taking opioid prescriptions. Functional ability and status Nutritional status Physical activity Advanced directives List of other physicians Hospitalizations, surgeries, and ER visits in previous 12 months Vitals Screenings to include cognitive, depression, and falls Referrals and appointments  In addition, I have reviewed and discussed with patient certain preventive protocols, quality metrics, and best practice recommendations. A written personalized care plan for preventive services as well as general preventive health recommendations were provided to patient.     LDionisio David LPN   [id number]  Nurse Notes: none

## 2022-12-10 NOTE — Patient Instructions (Signed)
[name] , Thank you for taking time to come for your Medicare Wellness Visit. I appreciate your ongoing commitment to your health goals. Please review the following plan we discussed and let me know if I can assist you in the future.   These are the goals we discussed:  Goals       DIET - EAT MORE FRUITS AND VEGETABLES      Patient Stated      12/02/2020, bring down A1C      Patient Stated      Loose some weight      PharmD "I need help affording this inhaler" (pt-stated)      CARE PLAN ENTRY (see longitudinal plan of care for additional care plan information)  Current Barriers:  Polypharmacy; complex patient with multiple comorbidities including COPD, CAD, HTN, HLD Financial concerns - reports that when she called [facility] today to check on the cost of the Trelegy, it was too expensive Most recent eGFR: 64 mL/min COPD: reports that no inhalers have ever seemed to help. Normal PFTs. Multiple no-shows CAD: follows w/ Dr. Rockey Situ, multiple no-shows. ASA 81 mg daily, atorvastatin 20 mg daily, imdur 30 mg daily T2DM; last A1c 7.2%; metformin 500 mg BID Peripheral neuropathy: gabapentin 300 mg TID   Pharmacist Clinical Goal(s):  Over the next 90 days, patient will work with PharmD and provider towards optimized medication management  Interventions: Inter-disciplinary care team collaboration (see longitudinal plan of care) Contacted [facility]. Copay for 90 day was $240, 30 day $142. Appears patient has not mer her deductible yet, and reports the inability to be able to pay that. Patient notes that inhalers "never help" anyway. Discussed patient assistance program availability and income requirements. Patient is going to talk to Dr. Patsey Berthold at next appt to determine if we need to pursue inhaler assistance.  Will collaborate w/ RN CM for follow up, support of care.   Patient Self Care Activities:  Patient will take medications as prescribed  Initial goal documentation       Weight  (lb) < 150 lb (68 kg)      Recommend cutting out bad carbohydrates in daily diet. Pt to focus on healthy carbs and spreading out servings in diet.         This is a list of the screening recommended for you and due dates:  Health Maintenance  Topic Date Due   Zoster (Shingles) Vaccine (1 of 2) Never done   COVID-19 Vaccine (4 - 2023-24 season) 06/22/2022   Mammogram  01/31/2023   Eye exam for diabetics  02/09/2023   Hemoglobin A1C  05/30/2023   Complete foot exam   08/15/2023   Yearly kidney health urinalysis for diabetes  09/18/2023   Screening for Lung Cancer  11/16/2023   Yearly kidney function blood test for diabetes  11/30/2023   Medicare Annual Wellness Visit  12/11/2023   DTaP/Tdap/Td vaccine (3 - Td or Tdap) 05/13/2024   Colon Cancer Screening  01/05/2027   Pneumonia Vaccine  Completed   Flu Shot  Completed   DEXA scan (bone density measurement)  Completed   Hepatitis C Screening: USPSTF Recommendation to screen - Ages 51-79 yo.  Completed   HPV Vaccine  Aged Out    Advanced directives: no  Conditions/risks identified: none  Next appointment: Follow up in one year for your annual wellness visit 12/16/23 @ 9:45 am by phone   Preventive Care 65 Years and Older, Female Preventive care refers to lifestyle choices and visits with  your health care provider that can promote health and wellness. What does preventive care include? A yearly physical exam. This is also called an annual well check. Dental exams once or twice a year. Routine eye exams. Ask your health care provider how often you should have your eyes checked. Personal lifestyle choices, including: Daily care of your teeth and gums. Regular physical activity. Eating a healthy diet. Avoiding tobacco and drug use. Limiting alcohol use. Practicing safe sex. Taking low-dose aspirin every day. Taking vitamin and mineral supplements as recommended by your health care provider. What happens during an annual well  check? The services and screenings done by your health care provider during your annual well check will depend on your age, overall health, lifestyle risk factors, and family history of disease. Counseling  Your health care provider may ask you questions about your: Alcohol use. Tobacco use. Drug use. Emotional well-being. Home and relationship well-being. Sexual activity. Eating habits. History of falls. Memory and ability to understand (cognition). Work and work Statistician. Reproductive health. Screening  You may have the following tests or measurements: Height, weight, and BMI. Blood pressure. Lipid and cholesterol levels. These may be checked every 5 years, or more frequently if you are over 76 years old. Skin check. Lung cancer screening. You may have this screening every year starting at age 44 if you have a 30-pack-year history of smoking and currently smoke or have quit within the past 15 years. Fecal occult blood test (FOBT) of the stool. You may have this test every year starting at age 59. Flexible sigmoidoscopy or colonoscopy. You may have a sigmoidoscopy every 5 years or a colonoscopy every 10 years starting at age 15. Hepatitis C blood test. Hepatitis B blood test. Sexually transmitted disease (STD) testing. Diabetes screening. This is done by checking your blood sugar (glucose) after you have not eaten for a while (fasting). You may have this done every 1-3 years. Bone density scan. This is done to screen for osteoporosis. You may have this done starting at age 58. Mammogram. This may be done every 1-2 years. Talk to your health care provider about how often you should have regular mammograms. Talk with your health care provider about your test results, treatment options, and if necessary, the need for more tests. Vaccines  Your health care provider may recommend certain vaccines, such as: Influenza vaccine. This is recommended every year. Tetanus, diphtheria, and  acellular pertussis (Tdap, Td) vaccine. You may need a Td booster every 10 years. Zoster vaccine. You may need this after age 24. Pneumococcal 13-valent conjugate (PCV13) vaccine. One dose is recommended after age 72. Pneumococcal polysaccharide (PPSV23) vaccine. One dose is recommended after age 67. Talk to your health care provider about which screenings and vaccines you need and how often you need them. This information is not intended to replace advice given to you by your health care provider. Make sure you discuss any questions you have with your health care provider. Document Released: 11/04/2015 Document Revised: 06/27/2016 Document Reviewed: 08/09/2015 Elsevier Interactive Patient Education  2017 [employer] Prevention in the Home Falls can cause injuries. They can happen to people of all ages. There are many things you can do to make your home safe and to help prevent falls. What can I do on the outside of my home? Regularly fix the edges of walkways and driveways and fix any cracks. Remove anything that might make you trip as you walk through a door, such as a raised  step or threshold. Trim any bushes or trees on the path to your home. Use bright outdoor lighting. Clear any walking paths of anything that might make someone trip, such as rocks or tools. Regularly check to see if handrails are loose or broken. Make sure that both sides of any steps have handrails. Any raised decks and porches should have guardrails on the edges. Have any leaves, snow, or ice cleared regularly. Use sand or salt on walking paths during winter. Clean up any spills in your garage right away. This includes oil or grease spills. What can I do in the bathroom? Use night lights. Install grab bars by the toilet and in the tub and shower. Do not use towel bars as grab bars. Use non-skid mats or decals in the tub or shower. If you need to sit down in the shower, use a plastic, non-slip stool. Keep  the floor dry. Clean up any water that spills on the floor as soon as it happens. Remove soap buildup in the tub or shower regularly. Attach bath mats securely with double-sided non-slip rug tape. Do not have throw rugs and other things on the floor that can make you trip. What can I do in the bedroom? Use night lights. Make sure that you have a light by your bed that is easy to reach. Do not use any sheets or blankets that are too big for your bed. They should not hang down onto the floor. Have a firm chair that has side arms. You can use this for support while you get dressed. Do not have throw rugs and other things on the floor that can make you trip. What can I do in the kitchen? Clean up any spills right away. Avoid walking on wet floors. Keep items that you use a lot in easy-to-reach places. If you need to reach something above you, use a strong step stool that has a grab bar. Keep electrical cords out of the way. Do not use floor polish or wax that makes floors slippery. If you must use wax, use non-skid floor wax. Do not have throw rugs and other things on the floor that can make you trip. What can I do with my stairs? Do not leave any items on the stairs. Make sure that there are handrails on both sides of the stairs and use them. Fix handrails that are broken or loose. Make sure that handrails are as long as the stairways. Check any carpeting to make sure that it is firmly attached to the stairs. Fix any carpet that is loose or worn. Avoid having throw rugs at the top or bottom of the stairs. If you do have throw rugs, attach them to the floor with carpet tape. Make sure that you have a light switch at the top of the stairs and the bottom of the stairs. If you do not have them, ask someone to add them for you. What else can I do to help prevent falls? Wear shoes that: Do not have high heels. Have rubber bottoms. Are comfortable and fit you well. Are closed at the toe. Do not  wear sandals. If you use a stepladder: Make sure that it is fully opened. Do not climb a closed stepladder. Make sure that both sides of the stepladder are locked into place. Ask someone to hold it for you, if possible. Clearly mark and make sure that you can see: Any grab bars or handrails. First and last steps. Where the edge of each  step is. Use tools that help you move around (mobility aids) if they are needed. These include: Canes. Walkers. Scooters. Crutches. Turn on the lights when you go into a dark area. Replace any light bulbs as soon as they burn out. Set up your furniture so you have a clear path. Avoid moving your furniture around. If any of your floors are uneven, fix them. If there are any pets around you, be aware of where they are. Review your medicines with your doctor. Some medicines can make you feel dizzy. This can increase your chance of falling. Ask your doctor what other things that you can do to help prevent falls. This information is not intended to replace advice given to you by your health care provider. Make sure you discuss any questions you have with your health care provider. Document Released: 08/04/2009 Document Revised: 03/15/2016 Document Reviewed: 11/12/2014 Elsevier Interactive Patient Education  2017 [employer].

## 2022-12-12 ENCOUNTER — Ambulatory Visit
Admission: RE | Admit: 2022-12-12 | Discharge: 2022-12-12 | Disposition: A | Discharge status: Home / Self Care | Payer: Medicare Other | Source: Ambulatory Visit | Attending: Nurse Practitioner | Admitting: Nurse Practitioner

## 2022-12-12 DIAGNOSIS — R1013 Epigastric pain: Secondary | ICD-10-CM | POA: Insufficient documentation

## 2022-12-12 DIAGNOSIS — R160 Hepatomegaly, not elsewhere classified: Secondary | ICD-10-CM | POA: Diagnosis not present

## 2022-12-12 NOTE — Progress Notes (Signed)
Please let patient know that her ultrasound showed that she has fatty liver.  This is likely why her liver enzymes have been elevated over the last couple of years.

## 2022-12-18 ENCOUNTER — Ambulatory Visit: Payer: Medicare Other | Admitting: Nurse Practitioner

## 2022-12-27 ENCOUNTER — Other Ambulatory Visit (HOSPITAL_COMMUNITY): Payer: Self-pay | Admitting: Cardiology

## 2022-12-27 ENCOUNTER — Other Ambulatory Visit: Payer: Self-pay | Admitting: Nurse Practitioner

## 2022-12-29 LAB — HEMOGLOBIN A1C: A1c: 7.8

## 2023-01-06 ENCOUNTER — Other Ambulatory Visit: Payer: Self-pay | Admitting: Nurse Practitioner

## 2023-01-06 ENCOUNTER — Other Ambulatory Visit (HOSPITAL_COMMUNITY): Payer: Self-pay | Admitting: Cardiology

## 2023-01-06 DIAGNOSIS — E785 Hyperlipidemia, unspecified: Secondary | ICD-10-CM

## 2023-01-07 ENCOUNTER — Telehealth: Payer: Self-pay | Admitting: Nurse Practitioner

## 2023-01-07 NOTE — Telephone Encounter (Signed)
Pt came by and dropped off [facility] Patient Assistance Program Application to be completed by the provider.  Placed in the providers folder in the back to complete.

## 2023-01-08 ENCOUNTER — Telehealth: Payer: Self-pay

## 2023-01-08 NOTE — Telephone Encounter (Signed)
Requested Prescriptions  Pending Prescriptions Disp Refills   metFORMIN (GLUCOPHAGE) 1000 MG tablet [Pharmacy Med Name: metFORMIN HCl 1000 MG Oral Tablet] 180 tablet 0    Sig: TAKE 1 TABLET BY MOUTH TWICE  DAILY WITH MEALS     Endocrinology:  Diabetes - Biguanides Failed - 01/06/2023 10:25 PM      Failed - HBA1C is between 0 and 7.9 and within 180 days    HB A1C (BAYER DCA - WAIVED)  Date Value Ref Range Status  01/23/2021 8.6 (H) <7.0 % Final    Comment:                                          Diabetic Adult            <7.0                                       Healthy Adult        4.3 - 5.7                                                           (DCCT/NGSP) American Diabetes Association's Summary of Glycemic Recommendations for Adults with Diabetes: Hemoglobin A1c <7.0%. More stringent glycemic goals (A1c <6.0%) may further reduce complications at the cost of increased risk of hypoglycemia.    Hgb A1c MFr Bld  Date Value Ref Range Status  11/29/2022 9.5 (H) 4.8 - 5.6 % Final    Comment:             Prediabetes: 5.7 - 6.4          Diabetes: >6.4          Glycemic control for adults with diabetes: <7.0          Passed - Cr in normal range and within 360 days    Creat  Date Value Ref Range Status  01/01/2018 0.82 0.50 - 0.99 mg/dL Final    Comment:    For patients >30 years of age, the reference limit for Creatinine is approximately 13% higher for people identified as African-American. .    Creatinine, Ser  Date Value Ref Range Status  11/29/2022 0.80 0.57 - 1.00 mg/dL Final         Passed - eGFR in normal range and within 360 days    GFR, Est African American  Date Value Ref Range Status  01/01/2018 88 > OR = 60 mL/min/1.59m2 Final   GFR calc Af Amer  Date Value Ref Range Status  10/17/2020 79 >59 mL/min/1.73 Final    Comment:    **In accordance with recommendations from the NKF-ASN Task force,**   [facility] is in the process of updating its eGFR calculation  to the   2021 CKD-EPI creatinine equation that estimates kidney function   without a race variable.    GFR, Est Non African American  Date Value Ref Range Status  01/01/2018 76 > OR = 60 mL/min/1.19m2 Final   GFR, Estimated  Date Value Ref Range Status  11/20/2022 >60 >60 mL/min Final    Comment:    (NOTE) Calculated using  the CKD-EPI Creatinine Equation (2021)    eGFR  Date Value Ref Range Status  11/29/2022 80 >59 mL/min/1.73 Final         Passed - B12 Level in normal range and within 720 days    Vitamin B-12  Date Value Ref Range Status  11/16/2022 494 232 - 1,245 pg/mL Final         Passed - Valid encounter within last 6 months    Recent Outpatient Visits           1 month ago Aortic atherosclerosis ([place])   [place] [name], NP   1 month ago Elevated LFTs   [place], NP   1 month ago Tingling in extremities   [place], Dani Gobble, PA-C   3 months ago Aortic atherosclerosis ([place])   [place] [name], NP   4 months ago Type 2 diabetes mellitus with peripheral neuropathy [facility])   [place] [name], NP       Future Appointments             In 1 month [name], NP [place], [facility]            Passed - CBC within normal limits and completed in the last 12 months    WBC  Date Value Ref Range Status  11/20/2022 6.7 4.0 - 10.5 K/uL Final   RBC  Date Value Ref Range Status  11/20/2022 4.27 3.87 - 5.11 MIL/uL Final   Hemoglobin  Date Value Ref Range Status  11/20/2022 12.4 12.0 - 15.0 g/dL Final  11/16/2022 11.0 (L) 11.1 - 15.9 g/dL Final   HCT  Date Value Ref Range Status  11/20/2022 38.7 36.0 - 46.0 % Final   Hematocrit  Date Value Ref Range Status  11/16/2022 34.4 34.0 - 46.6 % Final   MCHC  Date Value Ref Range Status   11/20/2022 32.0 30.0 - 36.0 g/dL Final   [facility]  Date Value Ref Range Status  11/20/2022 29.0 26.0 - 34.0 pg Final   MCV  Date Value Ref Range Status  11/20/2022 90.6 80.0 - 100.0 fL Final  11/16/2022 93 79 - 97 fL Final  02/24/2013 95 80 - 100 fL Final   No results found for: "PLTCOUNTKUC", "LABPLAT", "POCPLA" RDW  Date Value Ref Range Status  11/20/2022 14.9 11.5 - 15.5 % Final  11/16/2022 13.6 11.7 - 15.4 % Final  02/24/2013 13.0 11.5 - 14.5 % Final

## 2023-01-08 NOTE — Telephone Encounter (Cosign Needed)
Spoke with Olivia Mackie, she stated Nancee Liter was omitted and she spoke with [place] who will add it on

## 2023-01-08 NOTE — Telephone Encounter (Signed)
Patient came in to drop off form for [facility].  We don't know what medication needs to be renewed.  She is on two insulin's and trulicity through them.  Can you please help with this?  We have reached out to the pharmacy team via teams and there was no response.

## 2023-01-09 NOTE — Telephone Encounter (Signed)
Paperwork filled out and signed by provider.   Pharmacy team notified that forms were ready to be picked up.

## 2023-01-09 NOTE — Telephone Encounter (Signed)
Pt stated she just spoke with Olivia Mackie from pt assistance. Pt stated PCP is saying she isn't using Basaglar. However, pt stated she is and she needs it. She stated she also gets insulin lispro (HUMALOG KWIKPEN) 100 UNIT/ML KwikPen , Dulaglutide (TRULICITY) 4.5 0000000 SOPN through the [facility] Patient Assistance Program.  Please advise.

## 2023-01-09 NOTE — Telephone Encounter (Signed)
Let's get form filled out for patient.

## 2023-01-10 ENCOUNTER — Ambulatory Visit
Admission: RE | Admit: 2023-01-10 | Discharge: 2023-01-10 | Disposition: A | Discharge status: Home / Self Care | Payer: Medicare Other | Attending: Nurse Practitioner | Admitting: Nurse Practitioner

## 2023-01-10 ENCOUNTER — Encounter: Payer: Self-pay | Admitting: Nurse Practitioner

## 2023-01-10 ENCOUNTER — Ambulatory Visit (INDEPENDENT_AMBULATORY_CARE_PROVIDER_SITE_OTHER): Payer: Medicare Other | Admitting: Nurse Practitioner

## 2023-01-10 ENCOUNTER — Ambulatory Visit
Admission: RE | Admit: 2023-01-10 | Discharge: 2023-01-10 | Disposition: A | Discharge status: Home / Self Care | Payer: Medicare Other | Source: Ambulatory Visit | Attending: Nurse Practitioner | Admitting: Nurse Practitioner

## 2023-01-10 VITALS — BP 103/62 | HR 73 | Temp 97.7°F | Wt 212.4 lb

## 2023-01-10 DIAGNOSIS — R1031 Right lower quadrant pain: Secondary | ICD-10-CM

## 2023-01-10 DIAGNOSIS — R109 Unspecified abdominal pain: Secondary | ICD-10-CM | POA: Diagnosis not present

## 2023-01-10 LAB — URINALYSIS, ROUTINE W REFLEX MICROSCOPIC
Bilirubin, UA: NEGATIVE
Ketones, UA: NEGATIVE
Leukocytes,UA: NEGATIVE
Nitrite, UA: NEGATIVE
Protein,UA: NEGATIVE
RBC, UA: NEGATIVE
Specific Gravity, UA: <1.005 — ABNORMAL LOW (ref 1.005–1.030)
Urobilinogen, Ur: 0.2 mg/dL (ref 0.2–1.0)
pH, UA: 5 (ref 5.0–7.5)

## 2023-01-10 NOTE — Progress Notes (Signed)
Results discussed with patient during visit.

## 2023-01-10 NOTE — Progress Notes (Signed)
BP 103/62   Pulse 73   Temp 97.7 F (36.5 C) (Oral)   Wt 212 lb 6.4 oz (96.3 kg)   SpO2 96%   BMI 38.85 kg/m    Subjective:    Patient ID: Kim Buchanan, female    DOB: 19-Aug-1951, 72 y.o.   MRN: [id number]  HPI: Kim Buchanan is a 72 y.o. female  Chief Complaint  Patient presents with   Pain    Pt states she has been having pain and soreness in her R lower abdomen since last week    URINARY SYMPTOMS Sore on left lower pelvic area.   Dysuria: no Urinary frequency: yes Urgency: no Small volume voids: yes Symptom severity: no Urinary incontinence: no Foul odor: no Hematuria: no Abdominal pain: yes Back pain: no Suprapubic pain/pressure: no Flank pain: no Fever:  no Vomiting: no Relief with cranberry juice: no Relief with pyridium: no Status: stable Pain started last week then worsened yesterday.  Patient is having a dull pain.  Patient states its a 2/10.    ABDOMINAL ISSUES Duration: days Nature: dull Location: LLQ and RLQ  Severity: mild  Radiation: no Episode duration: Frequency: constant Very small amounts of stool       Relevant past medical, surgical, family and social history reviewed and updated as indicated. Interim medical history since our last visit reviewed. Allergies and medications reviewed and updated.  Review of Systems  Constitutional:  Negative for fever.  Gastrointestinal:  Positive for abdominal pain. Negative for vomiting.  Genitourinary:  Positive for decreased urine volume, frequency and urgency. Negative for dysuria, flank pain and hematuria.  Musculoskeletal:  Negative for back pain.    Per HPI unless specifically indicated above     Objective:    BP 103/62   Pulse 73   Temp 97.7 F (36.5 C) (Oral)   Wt 212 lb 6.4 oz (96.3 kg)   SpO2 96%   BMI 38.85 kg/m   Wt Readings from Last 3 Encounters:  01/10/23 212 lb 6.4 oz (96.3 kg)  12/10/22 214 lb (97.1 kg)  11/29/22 214 lb 11.2 oz (97.4 kg)    Physical  Exam Vitals and nursing note reviewed.  Constitutional:      General: She is not in acute distress.    Appearance: Normal appearance. She is normal weight. She is not ill-appearing, toxic-appearing or diaphoretic.  HENT:     Head: Normocephalic.     Right Ear: External ear normal.     Left Ear: External ear normal.     Nose: Nose normal.     Mouth/Throat:     Mouth: Mucous membranes are moist.     Pharynx: Oropharynx is clear.  Eyes:     General:        Right eye: No discharge.        Left eye: No discharge.     Extraocular Movements: Extraocular movements intact.     Conjunctiva/sclera: Conjunctivae normal.     Pupils: Pupils are equal, round, and reactive to light.  Cardiovascular:     Rate and Rhythm: Normal rate and regular rhythm.     Heart sounds: No murmur heard. Pulmonary:     Effort: Pulmonary effort is normal. No respiratory distress.     Breath sounds: Normal breath sounds. No wheezing or rales.  Abdominal:     General: Abdomen is flat. Bowel sounds are normal. There is no distension.     Palpations: Abdomen is soft.     Tenderness:  There is abdominal tenderness in the right lower quadrant and left lower quadrant. There is no right CVA tenderness, left CVA tenderness or guarding. Negative signs include Murphy's sign, McBurney's sign and obturator sign.  Musculoskeletal:     Cervical back: Normal range of motion and neck supple.  Skin:    General: Skin is warm and dry.     Capillary Refill: Capillary refill takes less than 2 seconds.  Neurological:     General: No focal deficit present.     Mental Status: She is alert and oriented to person, place, and time. Mental status is at baseline.  Psychiatric:        Mood and Affect: Mood normal.        Behavior: Behavior normal.        Thought Content: Thought content normal.        Judgment: Judgment normal.     Results for orders placed or performed in visit on 01/10/23  Urinalysis, Routine w reflex microscopic   Result Value Ref Range   Specific Gravity, UA <1.005 (L) 1.005 - 1.030   pH, UA 5.0 5.0 - 7.5   Color, UA Yellow Yellow   Appearance Ur Clear Clear   Leukocytes,UA Negative Negative   Protein,UA Negative Negative/Trace   Glucose, UA 2+ (A) Negative   Ketones, UA Negative Negative   RBC, UA Negative Negative   Bilirubin, UA Negative Negative   Urobilinogen, Ur 0.2 0.2 - 1.0 mg/dL   Nitrite, UA Negative Negative   Microscopic Examination Comment       Assessment & Plan:   Problem List Items Addressed This Visit   None Visit Diagnoses     Right lower quadrant abdominal pain    -  Primary   Suspect it is related to constipation.  Will obtain xray of abdomen.  Once results return will suggest bowel regimen.   Relevant Orders   Urinalysis, Routine w reflex microscopic (Completed)   DG Abd 2 Views        Follow up plan: Return if symptoms worsen or fail to improve.

## 2023-01-11 NOTE — Progress Notes (Signed)
Please let patient know that her abdominal xray looks good.  I recommend she start Miralax once daily with a full 8 ounces of water for 2 weeks.  If the pain is not improved, she should follow up.

## 2023-01-15 ENCOUNTER — Ambulatory Visit (INDEPENDENT_AMBULATORY_CARE_PROVIDER_SITE_OTHER): Payer: Medicare Other | Admitting: Nurse Practitioner

## 2023-01-15 ENCOUNTER — Ambulatory Visit: Payer: Self-pay | Admitting: *Deleted

## 2023-01-15 ENCOUNTER — Encounter: Payer: Self-pay | Admitting: Nurse Practitioner

## 2023-01-15 VITALS — BP 105/61 | HR 85 | Temp 97.8°F | Wt 216.4 lb

## 2023-01-15 DIAGNOSIS — R1031 Right lower quadrant pain: Secondary | ICD-10-CM

## 2023-01-15 DIAGNOSIS — R1030 Lower abdominal pain, unspecified: Secondary | ICD-10-CM

## 2023-01-15 NOTE — Telephone Encounter (Signed)
  Chief Complaint: right low abdominal pain  Symptoms: reports worsening sharp pain in right lower abdomen. Difficulty sitting and standing due to pain. LBM 01/15/23 Frequency: today  Pertinent Negatives: Patient denies fever, no nausea or vomiting today .  Disposition: [] ED /[] Urgent Care (no appt availability in office) / [x] Appointment(In office/virtual)/ []  [facility]/ [] Home Care/ [] Refused Recommended Disposition /[] [facility]/ []  Follow-up with PCP Additional Notes:   Appt scheduled to be seen within 4 hours. Instructed patient if pain worsens in intensity go to ED. Please advise last OV 01/10/23 x ray completed and patient told to call back for worsening pain . Notified [facility] via teams regarding appt and to notify PCP if ED recommended instead of OV due to being seen same issue 01/10/23.    Reason for Disposition  [1] MILD-MODERATE pain AND [2] constant AND [3] present > 2 hours  Answer Assessment - Initial Assessment Questions 1. LOCATION: "Where does it hurt?"      Low right side abdomen  2. RADIATION: "Does the pain shoot anywhere else?" (e.g., chest, back)     No  3. ONSET: "When did the pain begin?" (e.g., minutes, hours or days ago)      Today worsening pain  4. SUDDEN: "Gradual or sudden onset?"     Na  5. PATTERN "Does the pain come and go, or is it constant?"    - If it comes and goes: "How long does it last?" "Do you have pain now?"     (Note: Comes and goes means the pain is intermittent. It goes away completely between bouts.)    - If constant: "Is it getting better, staying the same, or getting worse?"      (Note: Constant means the pain never goes away completely; most serious pain is constant and gets worse.)      Constant  6. SEVERITY: "How bad is the pain?"  (e.g., Scale 1-10; mild, moderate, or severe)    - MILD (1-3): Doesn't interfere with normal activities, abdomen soft and not tender to touch.     - MODERATE (4-7): Interferes with  normal activities or awakens from sleep, abdomen tender to touch.     - SEVERE (8-10): Excruciating pain, doubled over, unable to do any normal activities.       Sharp pains difficulty standing and sitting  7. RECURRENT SYMPTOM: "Have you ever had this type of stomach pain before?" If Yes, ask: "When was the last time?" and "What happened that time?"      Yes  8. CAUSE: "What do you think is causing the stomach pain?"     Not sure  9. RELIEVING/AGGRAVATING FACTORS: "What makes it better or worse?" (e.g., antacids, bending or twisting motion, bowel movement)     na 10. OTHER SYMPTOMS: "Do you have any other symptoms?" (e.g., back pain, diarrhea, fever, urination pain, vomiting)       No nausea today LBM 01/15/23. No issues with urination 11. PREGNANCY: "Is there any chance you are pregnant?" "When was your last menstrual period?"       na  Protocols used: Abdominal Pain - [facility]

## 2023-01-15 NOTE — Progress Notes (Signed)
BP 105/61   Pulse 85   Temp 97.8 F (36.6 C) (Oral)   Wt 216 lb 6.4 oz (98.2 kg)   SpO2 96%   BMI 39.58 kg/m    Subjective:    Patient ID: Kim Buchanan, female    DOB: 10/27/1950, 72 y.o.   MRN: [id number]  HPI: Kim Buchanan is a 72 y.o. female  Chief Complaint  Patient presents with   Abdominal Pain    Pt states her pain is no better than when she was here last week. States it has gotten worse and describes the pain as a stabbing feelings.    Abdominal Pain Sore on left lower pelvic area.  Has worsened since last week.  She is using the miralax and had a bowel movement yesterday.  States she had a stabbing feeling this afternoon after she ate.  The pain was worse when she was getting up and down out of the chair.  No recent falls.  Dysuria: no Urinary frequency: yes Urgency: no Small volume voids: yes Symptom severity: no Urinary incontinence: no Foul odor: no Hematuria: no Abdominal pain: yes Back pain: no Suprapubic pain/pressure: no Flank pain: no Fever:  no Vomiting: no Relief with cranberry juice: no Relief with pyridium: no Status: stable Pain started last week then worsened yesterday.  Patient is having a dull pain.  Patient states its a 5/10.    ABDOMINAL ISSUES Duration: days Nature: dull Location: LLQ and RLQ  Severity: mild  Radiation: no Episode duration: Frequency: constant She did have a bowel movement yesterday.  She isn't drink a lot of water.           Relevant past medical, surgical, family and social history reviewed and updated as indicated. Interim medical history since our last visit reviewed. Allergies and medications reviewed and updated.  Review of Systems  Constitutional:  Negative for fever.  Gastrointestinal:  Positive for abdominal pain and constipation. Negative for vomiting.  Genitourinary:  Positive for decreased urine volume, frequency and urgency. Negative for dysuria, flank pain and hematuria.   Musculoskeletal:  Negative for back pain.    Per HPI unless specifically indicated above     Objective:    BP 105/61   Pulse 85   Temp 97.8 F (36.6 C) (Oral)   Wt 216 lb 6.4 oz (98.2 kg)   SpO2 96%   BMI 39.58 kg/m   Wt Readings from Last 3 Encounters:  01/15/23 216 lb 6.4 oz (98.2 kg)  01/10/23 212 lb 6.4 oz (96.3 kg)  12/10/22 214 lb (97.1 kg)    Physical Exam Vitals and nursing note reviewed.  Constitutional:      General: She is not in acute distress.    Appearance: Normal appearance. She is normal weight. She is not ill-appearing, toxic-appearing or diaphoretic.  HENT:     Head: Normocephalic.     Right Ear: External ear normal.     Left Ear: External ear normal.     Nose: Nose normal.     Mouth/Throat:     Mouth: Mucous membranes are moist.     Pharynx: Oropharynx is clear.  Eyes:     General:        Right eye: No discharge.        Left eye: No discharge.     Extraocular Movements: Extraocular movements intact.     Conjunctiva/sclera: Conjunctivae normal.     Pupils: Pupils are equal, round, and reactive to light.  Cardiovascular:  Rate and Rhythm: Normal rate and regular rhythm.     Heart sounds: No murmur heard. Pulmonary:     Effort: Pulmonary effort is normal. No respiratory distress.     Breath sounds: Normal breath sounds. No wheezing or rales.  Abdominal:     General: Abdomen is flat. Bowel sounds are normal. There is no distension.     Palpations: Abdomen is soft.     Tenderness: There is abdominal tenderness in the right lower quadrant and left lower quadrant. There is no right CVA tenderness, left CVA tenderness or guarding. Negative signs include Murphy's sign, McBurney's sign and obturator sign.  Musculoskeletal:     Cervical back: Normal range of motion and neck supple.  Skin:    General: Skin is warm and dry.     Capillary Refill: Capillary refill takes less than 2 seconds.  Neurological:     General: No focal deficit present.      Mental Status: She is alert and oriented to person, place, and time. Mental status is at baseline.  Psychiatric:        Mood and Affect: Mood normal.        Behavior: Behavior normal.        Thought Content: Thought content normal.        Judgment: Judgment normal.     Results for orders placed or performed in visit on 01/10/23  Urinalysis, Routine w reflex microscopic  Result Value Ref Range   Specific Gravity, UA <1.005 (L) 1.005 - 1.030   pH, UA 5.0 5.0 - 7.5   Color, UA Yellow Yellow   Appearance Ur Clear Clear   Leukocytes,UA Negative Negative   Protein,UA Negative Negative/Trace   Glucose, UA 2+ (A) Negative   Ketones, UA Negative Negative   RBC, UA Negative Negative   Bilirubin, UA Negative Negative   Urobilinogen, Ur 0.2 0.2 - 1.0 mg/dL   Nitrite, UA Negative Negative   Microscopic Examination Comment       Assessment & Plan:   Problem List Items Addressed This Visit   None Visit Diagnoses     Right lower quadrant abdominal pain    -  Primary   CMP, CBC completed in office. Xray neg. Not improving with miralax treatment. CT ordered for furhter evaluation of RLQ abdominal pain. FU once results return.   Relevant Orders   CT ABDOMEN PELVIS W CONTRAST   Lower abdominal pain       Relevant Orders   Comp Met (CMET)   CBC w/Diff        Follow up plan: Return if symptoms worsen or fail to improve.

## 2023-01-16 LAB — COMPREHENSIVE METABOLIC PANEL
ALT: 132 IU/L — ABNORMAL HIGH (ref 0–32)
AST: 127 IU/L — ABNORMAL HIGH (ref 0–40)
Albumin/Globulin Ratio: 1.4 (ref 1.2–2.2)
Albumin: 4 g/dL (ref 3.9–4.9)
Alkaline Phosphatase: 233 IU/L — ABNORMAL HIGH (ref 44–121)
BUN/Creatinine Ratio: 23 (ref 12–28)
BUN: 23 mg/dL (ref 8–27)
Bilirubin Total: 0.6 mg/dL (ref 0.0–1.2)
CO2: 20 mmol/L (ref 20–29)
Calcium: 9.1 mg/dL (ref 8.7–10.3)
Chloride: 97 mmol/L (ref 96–106)
Creatinine, Ser: 0.99 mg/dL (ref 0.57–1.00)
Globulin, Total: 2.9 g/dL (ref 1.5–4.5)
Glucose: 375 mg/dL — ABNORMAL HIGH (ref 70–99)
Potassium: 4.9 mmol/L (ref 3.5–5.2)
Sodium: 137 mmol/L (ref 134–144)
Total Protein: 6.9 g/dL (ref 6.0–8.5)
eGFR: 62 mL/min/{1.73_m2} (ref >59)

## 2023-01-16 LAB — CBC WITH DIFFERENTIAL/PLATELET
Basophils Absolute: 0 10*3/uL (ref 0.0–0.2)
Basos: 1 %
EOS (ABSOLUTE): 0.1 10*3/uL (ref 0.0–0.4)
Eos: 1 %
Hematocrit: 36.2 % (ref 34.0–46.6)
Hemoglobin: 11.3 g/dL (ref 11.1–15.9)
Immature Grans (Abs): 0 10*3/uL (ref 0.0–0.1)
Immature Granulocytes: 0 %
Lymphocytes Absolute: 1.4 10*3/uL (ref 0.7–3.1)
Lymphs: 19 %
MCH: 28 pg (ref 26.6–33.0)
MCHC: 31.2 g/dL — ABNORMAL LOW (ref 31.5–35.7)
MCV: 90 fL (ref 79–97)
Monocytes Absolute: 0.4 10*3/uL (ref 0.1–0.9)
Monocytes: 6 %
Neutrophils Absolute: 5.1 10*3/uL (ref 1.4–7.0)
Neutrophils: 73 %
Platelets: 275 10*3/uL (ref 150–450)
RBC: 4.03 x10E6/uL (ref 3.77–5.28)
RDW: 13.9 % (ref 11.7–15.4)
WBC: 7 10*3/uL (ref 3.4–10.8)

## 2023-01-16 NOTE — Progress Notes (Signed)
Please let patient know that her kidneys look good.  Her liver enzymes are elevated but we will find out more when we do the CT.  No evidence of acute infection at this time.  I will let her know once I get the CT results back.

## 2023-01-22 ENCOUNTER — Telehealth: Payer: Self-pay

## 2023-01-22 NOTE — Progress Notes (Cosign Needed)
Care Management & Coordination Services Pharmacy Team  Reason for Encounter: Patient assistance    Patient is currently enrolled in [place] patient assistance program for the medication Basaglar start 01/17/23 until date: 10/22/23   Kim Buchanan

## 2023-01-25 ENCOUNTER — Telehealth: Payer: Self-pay

## 2023-01-25 NOTE — Progress Notes (Cosign Needed)
Care Management & Coordination Services Pharmacy Team  Reason for Encounter: Diabetes  Contacted patient to discuss diabetes disease state. Spoke with patient on 02/07/2023    Recent office visits:  01/15/23-Karen Caren GriffinsHoldsworth, NP (PCP) Seen for abdominal pain.Labs ordered. X-ray ordered. Return if symptoms worsen or fail to improve.  01/10/23-Karen Caren GriffinsHoldsworth, NP (PCP) Seen for pain. X-ray ordered. Return if symptoms worsen or fail to improve.   11/29/22-Karen Caren GriffinsHoldsworth, NP (PCP) Seen for general follow up. Labs ordered. Ultrasound ordered for abdomen. Follow up in 3 months.  11/21/22-Karen Caren GriffinsHoldsworth, NP (PCP) Seen for hospital follow up. Labs ordered. Return for Keep scheduled follow up.  11/16/22-Erin E. Mecum, PA-C. Seen for numbness. X-rays ordered of entire spine. Labs ordered. 09/17/22-Karen Caren GriffinsHoldsworth, NP (PCP) Seen for a diabetic follow up visit. Labs ordered. X-ray ordered of lumbar spine. Follow up in 3 months.  08/21/22-Seen for a diabetic follow up visit. Return for Keep scheduled follow up.  08/14/22-Karen Caren GriffinsHoldsworth, NP (PCP) Seen for medication management. Increase Glimeperide to 4 mg. CT ordered of the lungs. Follow up in 1 month.   Recent consult visits:  09/28/22-Trevor Noralyn Pickarroll, MD ([facility]) Notes not available. 08/09/22-Darren Servando SnareWohl, MD (Gastroenterology) Inject hepatitis.   Hospital visits:  Medication Reconciliation was completed by comparing discharge summary, patient's EMR and Pharmacy list, and upon discussion with patient.  Admitted to the hospital on 11/20/22 due to Abnormal lab. Discharge date was 11/20/22. [facility]    [facility] -started none noted  Medication Changes at Hospital Discharge: -Changed none noted  Medications Discontinued at Hospital Discharge: -Stopped none noted  Medications that remain the same after Hospital Discharge:??  -All other  medications will remain the same.    Medications: Outpatient Encounter Medications as of 01/25/2023  Medication Sig   amiodarone (PACERONE) 200 MG tablet Take 0.5 tablets (100 mg total) by mouth daily.   aspirin 81 MG tablet Take 81 mg by mouth daily.    atorvastatin (LIPITOR) 10 MG tablet TAKE 1 TABLET BY MOUTH EVERY  NIGHT AT BEDTIME   Continuous Blood Gluc Receiver (FREESTYLE LIBRE 2 READER) DEVI 1 each by Does not apply route daily.   Continuous Blood Gluc Sensor (FREESTYLE LIBRE 2 SENSOR) MISC 1 each by Does not apply route every 14 (fourteen) days.   digoxin (LANOXIN) 0.125 MG tablet Take 1 tablet (0.125 mg total) by mouth daily.   Dulaglutide (TRULICITY) 4.5 MG/0.5ML SOPN Inject 4.5 mg as directed once a week.   gabapentin (NEURONTIN) 300 MG capsule Take 1 capsule (300 mg total) by mouth 3 (three) times daily.   glimepiride (AMARYL) 4 MG tablet Take 1 tablet (4 mg total) by mouth daily with breakfast.   Insulin Glargine (BASAGLAR KWIKPEN) 100 UNIT/ML Inject 80 Units into the skin daily.   insulin lispro (HUMALOG KWIKPEN) 100 UNIT/ML KwikPen Inject 5 Units into the skin 3 (three) times daily.   Insulin Pen Needle 30G X 6 MM MISC 1 Application by Does not apply route daily.   Lancets (ONETOUCH DELICA PLUS LANCET30G) MISC TEST ONCE DAILY   metFORMIN (GLUCOPHAGE) 1000 MG tablet TAKE 1 TABLET BY MOUTH TWICE  DAILY WITH MEALS   metoprolol succinate (TOPROL-XL) 50 MG 24 hr tablet TAKE 1 TABLET BY MOUTH DAILY   nitroGLYCERIN (NITROSTAT) 0.3 MG SL tablet Place 1 tablet (0.3 mg total) under the tongue every 5 (five) minutes as needed for chest pain.   ONETOUCH ULTRA test strip TEST ONCE DAILY   pantoprazole (  PROTONIX) 40 MG tablet TAKE 1 TABLET BY MOUTH DAILY   polyethylene glycol powder (GLYCOLAX/MIRALAX) 17 GM/SCOOP powder Take 17 g by mouth daily.   QUEtiapine (SEROQUEL) 200 MG tablet TAKE 1 TABLET BY MOUTH DAILY AT  BEDTIME   spironolactone (ALDACTONE) 25 MG tablet TAKE 1 TABLET BY MOUTH  DAILY   torsemide (DEMADEX) 20 MG tablet Patient takes 1 tablet by mouth one day then 2 tablets by mouth every other day.   valsartan (DIOVAN) 40 MG tablet TAKE 1 AND 1/2 TABLETS BY MOUTH  TWICE DAILY   No facility-administered encounter medications on file as of 01/25/2023.    Recent Relevant Labs: Lab Results  Component Value Date/Time   HGBA1C 9.5 (H) 11/29/2022 09:10 AM   HGBA1C 8.1 (H) 09/17/2022 03:45 PM   HGBA1C 8.6 (H) 01/23/2021 03:08 PM   HGBA1C 7.7 (H) 10/17/2020 10:21 AM   MICROALBUR 10 09/17/2022 03:43 PM   MICROALBUR 10 08/25/2021 10:23 AM    Kidney Function Lab Results  Component Value Date/Time   CREATININE 0.99 01/15/2023 03:45 PM   CREATININE 0.80 11/29/2022 09:10 AM   CREATININE 0.82 01/01/2018 12:00 AM   CREATININE 0.75 08/06/2016 08:24 AM   GFRNONAA >60 11/20/2022 09:42 AM   GFRNONAA 76 01/01/2018 12:00 AM   GFRAA 79 10/17/2020 10:27 AM   GFRAA 88 01/01/2018 12:00 AM   Current antihyperglycemic regimen:  Trulicity 4.5 mg inject 4.5 mg weekly Glimepiride 4 mg take 1 tablet daily Basaglar 100 units inject 80 units daily Humalog 100 units inject 3 units 3x daily Metformin 1000 mg take 1 tablet twice daily   Patient verbally confirms she is taking the above medications as directed. Yes  What diet changes have been made to improve diabetes control?  What recent interventions/DTPs have been made to improve glycemic control:    Have there been any recent hospitalizations or ED visits since last visit with PharmD? Yes  Patient denies hypoglycemic symptoms, including Pale, Sweaty, Shaky, Hungry, Nervous/irritable, and Vision changes  Patient denies hyperglycemic symptoms, including blurry vision, excessive thirst, fatigue, polyuria, and weakness  How often are you checking your blood sugar? once daily  What are your blood sugars ranging?  Fasting: 141 Before meals:  After meals:  Bedtime:   During the week, how often does your blood glucose drop below  70? Never  Are you checking your feet daily/regularly? Yes  I attempted to get some blood sugar readings but patient states she does not have any recorded. I reccommended for her to keep track of her blood glucose levels as often as she can. Patient understood.   Adherence Review: Is the patient currently on a STATIN medication? Yes Is the patient currently on ACE/ARB medication? No Does the patient have >5 day gap between last estimated fill dates? Yes  Star Rating Drugs:  Atorvastatin 10 mg Last filled:01/14/23 100 DS, 11/12/22 90 DS Trulicity 4.5 mg Last filled:06/13/21 28 DS, 03/13/21 28 DS Glimepride 4 mg Last filled:11/21/22 90 DS, 10/24/233 90 DS Basaglar 100 units Last filled:N/a Humalog 100 units Last filled:N/a Metformin 1000 mg Last filled:01/14/23 90 DS, 11/12/22 90 DS Valsartan 40 mg Last filled:11/30/22 100 DS,09/28/22 90 DS  Care Gaps: Annual wellness visit in last year? Yes Last eye exam / retinopathy screening:02/08/22 Last diabetic foot exam:08/14/22   Rance Muir, RMA

## 2023-01-30 ENCOUNTER — Ambulatory Visit: Payer: Medicare Other

## 2023-01-31 ENCOUNTER — Telehealth: Payer: Self-pay | Admitting: Nurse Practitioner

## 2023-01-31 ENCOUNTER — Telehealth: Payer: Self-pay

## 2023-01-31 DIAGNOSIS — E1142 Type 2 diabetes mellitus with diabetic polyneuropathy: Secondary | ICD-10-CM

## 2023-01-31 NOTE — Telephone Encounter (Signed)
Patient's sugars are still running over 400.  Discussed Increasing Basaglar to 80u in the evening and 20u in the am.  Continue with Humalog 7u TID with meals.  Discussed referral to Endocrinology.    Would like to switch patient from Trulicity to Ozempic to help with A1c control.  Will send this message to [facility] pharmacy team for help with this.

## 2023-02-03 ENCOUNTER — Other Ambulatory Visit (HOSPITAL_COMMUNITY): Payer: Self-pay | Admitting: Cardiology

## 2023-02-04 ENCOUNTER — Ambulatory Visit (INDEPENDENT_AMBULATORY_CARE_PROVIDER_SITE_OTHER): Payer: Medicare Other | Admitting: Nurse Practitioner

## 2023-02-04 ENCOUNTER — Encounter: Payer: Self-pay | Admitting: Nurse Practitioner

## 2023-02-04 VITALS — BP 88/54 | HR 74 | Temp 98.1°F | Wt 214.3 lb

## 2023-02-04 DIAGNOSIS — Z1231 Encounter for screening mammogram for malignant neoplasm of breast: Secondary | ICD-10-CM

## 2023-02-04 DIAGNOSIS — E1142 Type 2 diabetes mellitus with diabetic polyneuropathy: Secondary | ICD-10-CM | POA: Diagnosis not present

## 2023-02-04 NOTE — Assessment & Plan Note (Addendum)
Chronic. Not well controlled. Sugars are running 300-400.  Using Basaglar 20u in the am 80 in the pm.  Also using Humalog 7u with meals. Referral placed for endocrinology. Will work to get patient an appt with Endo.  Reached out to [facility] team to work on changing from [employer] to [employer]. Follow up in 3 weeks.  Call sooner if concerns arise.

## 2023-02-04 NOTE — Patient Instructions (Signed)
[phone] endocrinology

## 2023-02-04 NOTE — Progress Notes (Signed)
BP (!) 88/54   Pulse 74   Temp 98.1 F (36.7 C) (Oral)   Wt 214 lb 4.8 oz (97.2 kg)   SpO2 96%   BMI 39.20 kg/m    Subjective:    Patient ID: Kim Buchanan, female    DOB: 1951/08/08, 72 y.o.   MRN: [id number]  HPI: Kim Buchanan is a 72 y.o. female  Chief Complaint  Patient presents with   Diabetes   DIABETES Patient is using Basaglar 20u in the morning and  Hypoglycemic episodes:no Polydipsia/polyuria: no Visual disturbance: no Chest pain: no Paresthesias: no Glucose Monitoring: yes  Accucheck frequency: Daily  Fasting glucose: 300-400  Post prandial:  Evening:  Before meals: Taking Insulin?: yes  Long acting insulin: Basaglar  Short acting insulin: Humalog 7u   Relevant past medical, surgical, family and social history reviewed and updated as indicated. Interim medical history since our last visit reviewed. Allergies and medications reviewed and updated.  Review of Systems  Eyes:  Negative for visual disturbance.  Cardiovascular:  Negative for chest pain.  Endocrine: Negative for polydipsia and polyuria.  Neurological:  Negative for numbness.    Per HPI unless specifically indicated above     Objective:    BP (!) 88/54   Pulse 74   Temp 98.1 F (36.7 C) (Oral)   Wt 214 lb 4.8 oz (97.2 kg)   SpO2 96%   BMI 39.20 kg/m   Wt Readings from Last 3 Encounters:  02/04/23 214 lb 4.8 oz (97.2 kg)  01/15/23 216 lb 6.4 oz (98.2 kg)  01/10/23 212 lb 6.4 oz (96.3 kg)    Physical Exam Vitals and nursing note reviewed.  Constitutional:      General: She is not in acute distress.    Appearance: Normal appearance. She is obese. She is not ill-appearing, toxic-appearing or diaphoretic.  HENT:     Head: Normocephalic.     Right Ear: External ear normal.     Left Ear: External ear normal.     Nose: Nose normal.     Mouth/Throat:     Mouth: Mucous membranes are moist.     Pharynx: Oropharynx is clear.  Eyes:     General:        Right eye: No  discharge.        Left eye: No discharge.     Extraocular Movements: Extraocular movements intact.     Conjunctiva/sclera: Conjunctivae normal.     Pupils: Pupils are equal, round, and reactive to light.  Cardiovascular:     Rate and Rhythm: Normal rate and regular rhythm.     Heart sounds: No murmur heard. Pulmonary:     Effort: Pulmonary effort is normal. No respiratory distress.     Breath sounds: Normal breath sounds. No wheezing or rales.  Musculoskeletal:     Cervical back: Normal range of motion and neck supple.  Skin:    General: Skin is warm and dry.     Capillary Refill: Capillary refill takes less than 2 seconds.  Neurological:     General: No focal deficit present.     Mental Status: She is alert and oriented to person, place, and time. Mental status is at baseline.  Psychiatric:        Mood and Affect: Mood normal.        Behavior: Behavior normal.        Thought Content: Thought content normal.        Judgment: Judgment normal.  Results for orders placed or performed in visit on 01/17/23  Hemoglobin A1c  Result Value Ref Range   A1c 7.8%       Assessment & Plan:   Problem List Items Addressed This Visit       Endocrine   Type 2 diabetes mellitus with peripheral neuropathy    Chronic. Not well controlled. Sugars are running 300-400.  Using Basaglar 20u in the am 80 in the pm.  Also using Humalog 7u with meals. Referral placed for endocrinology. Will work to get patient an appt with Endo.  Reached out to [facility] team to work on changing from [employer] to [employer]. Follow up in 3 weeks.  Call sooner if concerns arise.       Other Visit Diagnoses     Encounter for screening mammogram for malignant neoplasm of breast    -  Primary   Relevant Orders   MM 3D SCREENING MAMMOGRAM BILATERAL BREAST        Follow up plan: No follow-ups on file.

## 2023-02-06 DIAGNOSIS — E785 Hyperlipidemia, unspecified: Secondary | ICD-10-CM | POA: Diagnosis not present

## 2023-02-06 DIAGNOSIS — E1159 Type 2 diabetes mellitus with other circulatory complications: Secondary | ICD-10-CM | POA: Diagnosis not present

## 2023-02-06 DIAGNOSIS — Z794 Long term (current) use of insulin: Secondary | ICD-10-CM | POA: Diagnosis not present

## 2023-02-06 DIAGNOSIS — E1165 Type 2 diabetes mellitus with hyperglycemia: Secondary | ICD-10-CM | POA: Diagnosis not present

## 2023-02-06 DIAGNOSIS — I152 Hypertension secondary to endocrine disorders: Secondary | ICD-10-CM | POA: Diagnosis not present

## 2023-02-06 DIAGNOSIS — E1169 Type 2 diabetes mellitus with other specified complication: Secondary | ICD-10-CM | POA: Diagnosis not present

## 2023-02-11 DIAGNOSIS — Z794 Long term (current) use of insulin: Secondary | ICD-10-CM | POA: Diagnosis not present

## 2023-02-11 DIAGNOSIS — E1165 Type 2 diabetes mellitus with hyperglycemia: Secondary | ICD-10-CM | POA: Diagnosis not present

## 2023-02-18 ENCOUNTER — Other Ambulatory Visit: Payer: Self-pay | Admitting: Nurse Practitioner

## 2023-02-19 DIAGNOSIS — E119 Type 2 diabetes mellitus without complications: Secondary | ICD-10-CM | POA: Diagnosis not present

## 2023-02-19 DIAGNOSIS — H2513 Age-related nuclear cataract, bilateral: Secondary | ICD-10-CM | POA: Diagnosis not present

## 2023-02-19 DIAGNOSIS — H5203 Hypermetropia, bilateral: Secondary | ICD-10-CM | POA: Diagnosis not present

## 2023-02-19 LAB — HM DIABETES EYE EXAM

## 2023-02-19 NOTE — Telephone Encounter (Signed)
Requested medication (s) are due for refill today: yes  Requested medication (s) are on the active medication list: yes  Last refill:  08/14/22 #90 1 refills  Future visit scheduled: yes in 1 week  Notes to clinic:  no refills remain. Do you want to refill prior to OV?     Requested Prescriptions  Pending Prescriptions Disp Refills   glimepiride (AMARYL) 4 MG tablet [Pharmacy Med Name: GLIMEPIRIDE 4 MG TABLET] 90 tablet 0    Sig: Take 1 tablet (4 mg total) by mouth daily with breakfast.     Endocrinology:  Diabetes - Sulfonylureas Failed - 02/18/2023  5:53 PM      Failed - HBA1C is between 0 and 7.9 and within 180 days    HB A1C (BAYER DCA - WAIVED)  Date Value Ref Range Status  01/23/2021 8.6 (H) <7.0 % Final    Comment:                                          Diabetic Adult            <7.0                                       Healthy Adult        4.3 - 5.7                                                           (DCCT/NGSP) American Diabetes Association's Summary of Glycemic Recommendations for Adults with Diabetes: Hemoglobin A1c <7.0%. More stringent glycemic goals (A1c <6.0%) may further reduce complications at the cost of increased risk of hypoglycemia.    Hgb A1c MFr Bld  Date Value Ref Range Status  11/29/2022 9.5 (H) 4.8 - 5.6 % Final    Comment:             Prediabetes: 5.7 - 6.4          Diabetes: >6.4          Glycemic control for adults with diabetes: <7.0    A1c  Date Value Ref Range Status  12/29/2022 7.8%  Final    Comment:    Abstracted by HIM         Passed - Cr in normal range and within 360 days    Creat  Date Value Ref Range Status  01/01/2018 0.82 0.50 - 0.99 mg/dL Final    Comment:    For patients >23 years of age, the reference limit for Creatinine is approximately 13% higher for people identified as African-American. .    Creatinine, Ser  Date Value Ref Range Status  01/15/2023 0.99 0.57 - 1.00 mg/dL Final         Passed -  Valid encounter within last 6 months    Recent Outpatient Visits           2 weeks ago Encounter for screening mammogram for malignant neoplasm of breast   [place] [facility] [name], NP   1 month ago Right lower quadrant abdominal pain   [place] [facility] [place], Clydie Braun,  NP   1 month ago Right lower quadrant abdominal pain   [place] [facility] [name], NP   2 months ago Aortic atherosclerosis [facility])   [place] [facility] [name], NP   3 months ago Elevated LFTs   [place] [facility] [name], NP       Future Appointments             In 1 week [name], NP [place] [facility]   In 1 week [name], Raymon Mutton, PA-C [facility]

## 2023-02-21 ENCOUNTER — Encounter: Payer: Self-pay | Admitting: Nurse Practitioner

## 2023-02-27 ENCOUNTER — Ambulatory Visit: Payer: Medicare Other | Admitting: Nurse Practitioner

## 2023-02-27 NOTE — Progress Notes (Deleted)
There were no vitals taken for this visit.   Subjective:    Patient ID: Kim Buchanan, female    DOB: 05-25-1951, 72 y.o.   MRN: [id number]  HPI: Kim Buchanan is a 73 y.o. female  No chief complaint on file.   HYPERTENSION / HYPERLIPIDEMIA Satisfied with current treatment? yes Duration of hypertension: years BP monitoring frequency: a few times a month BP range: 120/80 BP medication side effects: no Past BP meds:  metoprolol and valsartan Duration of hyperlipidemia: years Cholesterol medication side effects: no Cholesterol supplements: none Past cholesterol medications: atorvastain (lipitor) Medication compliance: excellent compliance Aspirin: yes Recent stressors: no Recurrent headaches: no Visual changes: no Palpitations: no Dyspnea: yes Chest pain: no Lower extremity edema: no Dizzy/lightheaded: no  DIABETES Hypoglycemic episodes:no Polydipsia/polyuria: no Visual disturbance: no Chest pain: no Paresthesias: no Glucose Monitoring: yes  Accucheck frequency: in the morning  Fasting glucose: 200s  Post prandial:  Evening:  Before meals: Taking Insulin?: yes  Long acting insulin: Has gotten up to 80u  Short acting insulin: Humalog 5 TID Blood Pressure Monitoring: a few times a month Retinal Examination: Up to Date Foot Exam: Up to Date Diabetic Education: Not Completed Pneumovax: Up to Date Influenza: Up to Date Aspirin: yes  Patient states she is having some abdominal pain that started yesterday.  States it is numb.  If she pushes on the area she is having pain.  She describes the pain as achy.  No changes in bowel movements, no vomiting or nausea.   Relevant past medical, surgical, family and social history reviewed and updated as indicated. Interim medical history since our last visit reviewed. Allergies and medications reviewed and updated.  Review of Systems  Eyes:  Negative for visual disturbance.  Respiratory:  Positive for shortness of  breath. Negative for cough and chest tightness.   Cardiovascular:  Negative for chest pain, palpitations and leg swelling.  Gastrointestinal:  Positive for abdominal pain.  Neurological:  Negative for dizziness and headaches.  Psychiatric/Behavioral:  Positive for sleep disturbance.     Per HPI unless specifically indicated above     Objective:    There were no vitals taken for this visit.  Wt Readings from Last 3 Encounters:  02/04/23 214 lb 4.8 oz (97.2 kg)  01/15/23 216 lb 6.4 oz (98.2 kg)  01/10/23 212 lb 6.4 oz (96.3 kg)    Physical Exam Vitals and nursing note reviewed.  Constitutional:      General: She is not in acute distress.    Appearance: Normal appearance. She is obese. She is not ill-appearing, toxic-appearing or diaphoretic.  HENT:     Head: Normocephalic.     Right Ear: External ear normal.     Left Ear: External ear normal.     Nose: Nose normal.     Mouth/Throat:     Mouth: Mucous membranes are moist.     Pharynx: Oropharynx is clear.  Eyes:     General:        Right eye: No discharge.        Left eye: No discharge.     Extraocular Movements: Extraocular movements intact.     Conjunctiva/sclera: Conjunctivae normal.     Pupils: Pupils are equal, round, and reactive to light.  Cardiovascular:     Rate and Rhythm: Normal rate and regular rhythm.     Heart sounds: No murmur heard. Pulmonary:     Effort: Pulmonary effort is normal. No respiratory distress.  Breath sounds: Normal breath sounds. No wheezing or rales.  Abdominal:     General: Abdomen is flat. Bowel sounds are normal.     Palpations: Abdomen is soft.     Tenderness: There is abdominal tenderness in the right upper quadrant, epigastric area and left upper quadrant.  Musculoskeletal:     Cervical back: Normal range of motion and neck supple.  Skin:    General: Skin is warm and dry.     Capillary Refill: Capillary refill takes less than 2 seconds.  Neurological:     General: No focal  deficit present.     Mental Status: She is alert and oriented to person, place, and time. Mental status is at baseline.  Psychiatric:        Mood and Affect: Mood normal.        Behavior: Behavior normal.        Thought Content: Thought content normal.        Judgment: Judgment normal.    Results for orders placed or performed in visit on 02/21/23  HM DIABETES EYE EXAM  Result Value Ref Range   HM Diabetic Eye Exam No Retinopathy No Retinopathy      Assessment & Plan:   Problem List Items Addressed This Visit   None    Follow up plan: No follow-ups on file.

## 2023-02-28 ENCOUNTER — Ambulatory Visit (INDEPENDENT_AMBULATORY_CARE_PROVIDER_SITE_OTHER): Payer: Medicare Other | Admitting: Nurse Practitioner

## 2023-02-28 ENCOUNTER — Encounter: Payer: Self-pay | Admitting: Nurse Practitioner

## 2023-02-28 VITALS — BP 101/50 | HR 74 | Temp 97.6°F | Wt 215.4 lb

## 2023-02-28 DIAGNOSIS — I152 Hypertension secondary to endocrine disorders: Secondary | ICD-10-CM | POA: Diagnosis not present

## 2023-02-28 DIAGNOSIS — I5020 Unspecified systolic (congestive) heart failure: Secondary | ICD-10-CM

## 2023-02-28 DIAGNOSIS — E1159 Type 2 diabetes mellitus with other circulatory complications: Secondary | ICD-10-CM

## 2023-02-28 DIAGNOSIS — E1142 Type 2 diabetes mellitus with diabetic polyneuropathy: Secondary | ICD-10-CM | POA: Diagnosis not present

## 2023-02-28 MED ORDER — FREESTYLE LIBRE 2 SENSOR MISC
1.0000 | 1 refills | Status: DC
Start: 2023-02-28 — End: 2023-08-14

## 2023-02-28 NOTE — Progress Notes (Signed)
BP (!) 101/50   Pulse 74   Temp 97.6 F (36.4 C) (Oral)   Wt 215 lb 6.4 oz (97.7 kg)   SpO2 97%   BMI 39.40 kg/m    Subjective:    Patient ID: Kim Buchanan, female    DOB: [birth year], 72 y.o.   MRN: [id number]  HPI: Kim Buchanan is a 72 y.o. female  Chief Complaint  Patient presents with   Diabetes    HYPERTENSION / HYPERLIPIDEMIA Blood pressures are a little soft in the office.  Does not have a blood pressure cuff at home.   Satisfied with current treatment? yes Duration of hypertension: years BP monitoring frequency: a few times a month BP range:  BP medication side effects: no Past BP meds:  metoprolol and valsartan Duration of hyperlipidemia: years Cholesterol medication side effects: no Cholesterol supplements: none Past cholesterol medications: atorvastain (lipitor) Medication compliance: excellent compliance Aspirin: yes Recent stressors: no Recurrent headaches: no Visual changes: no Palpitations: no Dyspnea: yes Chest pain: no Lower extremity edema: no Dizzy/lightheaded: no  DIABETES Patient has established care with Endocrinology. Patient assistance has been completed for Ozempic.  She will be switched from trulicity to Ozempic.  Continued on Long and Short acting insulin.  Now using freestyle libre. Hypoglycemic episodes:no Polydipsia/polyuria: no Visual disturbance: no Chest pain: no Paresthesias: no Glucose Monitoring: yes  Accucheck frequency: in the morning  Fasting glucose: 200s  Post prandial:  Evening:  Before meals: Taking Insulin?: yes  Long acting insulin: Has gotten up to 100u  Short acting insulin: Humalog 7 TID Blood Pressure Monitoring: a few times a month Retinal Examination: Up to Date Foot Exam: Up to Date Diabetic Education: Not Completed Pneumovax: Up to Date Influenza: Up to Date Aspirin: yes    Relevant past medical, surgical, family and social history reviewed and updated as indicated. Interim medical  history since our last visit reviewed. Allergies and medications reviewed and updated.  Review of Systems  Eyes:  Negative for visual disturbance.  Respiratory:  Negative for cough, chest tightness and shortness of breath.   Cardiovascular:  Negative for chest pain, palpitations and leg swelling.  Neurological:  Negative for dizziness and headaches.    Per HPI unless specifically indicated above     Objective:    BP (!) 101/50   Pulse 74   Temp 97.6 F (36.4 C) (Oral)   Wt 215 lb 6.4 oz (97.7 kg)   SpO2 97%   BMI 39.40 kg/m   Wt Readings from Last 3 Encounters:  02/28/23 215 lb 6.4 oz (97.7 kg)  02/04/23 214 lb 4.8 oz (97.2 kg)  01/15/23 216 lb 6.4 oz (98.2 kg)    Physical Exam Vitals and nursing note reviewed.  Constitutional:      General: She is not in acute distress.    Appearance: Normal appearance. She is obese. She is not ill-appearing, toxic-appearing or diaphoretic.  HENT:     Head: Normocephalic.     Right Ear: External ear normal.     Left Ear: External ear normal.     Nose: Nose normal.     Mouth/Throat:     Mouth: Mucous membranes are moist.     Pharynx: Oropharynx is clear.  Eyes:     General:        Right eye: No discharge.        Left eye: No discharge.     Extraocular Movements: Extraocular movements intact.     Conjunctiva/sclera: Conjunctivae normal.  Pupils: Pupils are equal, round, and reactive to light.  Cardiovascular:     Rate and Rhythm: Normal rate and regular rhythm.     Heart sounds: No murmur heard. Pulmonary:     Effort: Pulmonary effort is normal. No respiratory distress.     Breath sounds: Normal breath sounds. No wheezing or rales.  Abdominal:     General: Abdomen is flat. Bowel sounds are normal.     Palpations: Abdomen is soft.     Tenderness: There is abdominal tenderness in the right upper quadrant, epigastric area and left upper quadrant.  Musculoskeletal:     Cervical back: Normal range of motion and neck supple.   Skin:    General: Skin is warm and dry.     Capillary Refill: Capillary refill takes less than 2 seconds.  Neurological:     General: No focal deficit present.     Mental Status: She is alert and oriented to person, place, and time. Mental status is at baseline.  Psychiatric:        Mood and Affect: Mood normal.        Behavior: Behavior normal.        Thought Content: Thought content normal.        Judgment: Judgment normal.     Results for orders placed or performed in visit on 02/21/23  HM DIABETES EYE EXAM  Result Value Ref Range   HM Diabetic Eye Exam No Retinopathy No Retinopathy      Assessment & Plan:   Problem List Items Addressed This Visit       Cardiovascular and Mediastinum   Hypertension associated with diabetes (HCC) - Primary    Chronic. Blood pressure a little soft in the office today.  Will send DME order for blood pressure cuff.  Recommend checking blood pressures at home. If <100/70s, call the office.  Follow up in 5 months.  Will have labs done with Endocrinology.      Relevant Orders   For home use only DME Other see comment   HFrEF (heart failure with reduced ejection fraction) (HCC)    Chronic. Blood pressure a little soft in the office today.  Will send DME order for blood pressure cuff.  Recommend checking blood pressures at home. If <100/70s, call the office.  Follow up in 5 months.  Will have labs done with Endocrinology.      Relevant Orders   For home use only DME Other see comment     Endocrine   Type 2 diabetes mellitus with peripheral neuropathy (HCC)    Chronic.  Not well controlled.  Will continue to let Endocrinology manage medications.  She will be switched from Trulicity to Ozempic.  Continue with long and short acting insulin.  Follow up in 5 months.  Call sooner if concerns arise.       Relevant Medications   Continuous Glucose Sensor (FREESTYLE LIBRE 2 SENSOR) MISC      Follow up plan: Return in about 5 months (around  08/14/2023) for HTN, HLD, DM2 FU.

## 2023-02-28 NOTE — Progress Notes (Signed)
Cardiology Office Note    Date:  03/01/2023   ID:  ANITHA SZYMBORSKI, DOB [birth year], MRN [id number]  PCP:  [name], NP  Cardiologist:  Julien Nordmann, MD  Electrophysiologist:  Lanier Prude, MD  Advanced Heart Failure: Shirlee Latch  Chief Complaint: Follow-up  History of Present Illness:   Kim Buchanan is a 72 y.o. female with history of nonobstructive CAD, HFrEF secondary to NICM, PVCs, DM2, HLD, COPD with prior tobacco use, restrictive lung disease, elevated LFTs, obesity, anxiety, and GERD who presents for follow-up of her cardiomyopathy.  She was diagnosed with cardiomyopathy in 08/2019, when echo showed an EF of 25 to 30%.  Subsequent diagnostic cath 04/2019 showed up to 70% stenosis in the proximal RCA negative by FFR with nonobstructive disease involving the mid LAD.  Medical therapy recommended.  Of note, EF was 50 to 55% by ventriculography but subsequent echo in 05/2020 again showed LV dysfunction with an EF of 30 to 35%.  Diuresis has historically been limited by AKI.  Echo from 12/2020 showed a persistent cardiomyopathy with an EF of 30 to 35% with global kinesis.  In the setting of ongoing dyspnea, she underwent RHC in 02/2021 which showed elevated right heart pressures with low cardiac output and index at 2.8 and 1.3 respectively.  She was referred to the advanced heart failure service and was also noted to have frequent PVCs.  Subsequent Zio patch did reveal a 20.6% PVC burden leading her to be placed on amiodarone.  She was referred to EP with repeat monitoring in 06/2021 showing an improved PVC burden of 5.8% with recommendation to continue amiodarone.  Sleep study in 05/2021 showed no central or OSA.  She underwent cardiac MRI in 06/2021 40% with marked septal lateral dyssynchrony and septal hypokinesis.  There were also small discrete areas of mid wall LGE in the apical lateral septal walls with question history of myocarditis.  Most recent echo from 02/2022 showed an EF of  35 to 40%, global hypokinesis, grade 1 diastolic dysfunction, septal dyssynchrony, mildly reduced RV systolic function with normal ventricular cavity size, and trivial mitral regurgitation.  She has been followed by the advanced heart failure service most recently.  Escalation of GDMT has been limited by hypotension, dizziness, and off target effects (multiple yeast infections on SGLT2 inhibitor).  In the setting of abnormal LFTs, amiodarone has been reduced to 100 mg.  She was last seen by the advanced heart failure service in 08/2022 and was without symptoms of angina or cardiac decompensation.  She reported dyspnea walking further distances on flat ground or walking up an incline.  She reported a home weight of 205 pounds.  She was adherent to medications.  She comes in today and is companied by her husband.  She is without symptoms of angina or cardiac decompensation.  She notes a longstanding stable chronic dyspnea that is unchanged compared to when I last saw her in 2022.  Lower extremity swelling, abdominal distention, early satiety, or progressive orthopnea.  She is adherent and tolerating cardiac medications.  No falls or symptoms concerning for bleeding.  When compared to her last visit in this office, in 08/2021, her weight is down to 216 from 225 pounds.  She does note some significant positional dizziness and reports her blood pressure has been on the low side, frequently in the low 100s to 90s systolic.   Labs independently reviewed: 12/2022 - Hgb 11.3, PLT 275, BUN 23, serum creatinine 0.99, potassium 4.9, albumin 4.0,  AST 127, ALT 132 with A1c 7.8 11/2022 - TC 80, TG 173, HDL 33, LDL 19 10/2022 - digoxin 1.0 06/2022 - TSH 8.726, free T4 normal, free T3 normal  Past Medical History:  Diagnosis Date   Anxiety    Arthritis    lower back   COPD (chronic obstructive pulmonary disease) (HCC)    Depression    Diabetes mellitus without complication (HCC)    GERD (gastroesophageal reflux  disease)    Hyperlipidemia    Morbid obesity (HCC)    NICM (nonischemic cardiomyopathy) (HCC)    a. 03/2019 Echo: Nl LVEF; b. 09/04/2019 Echo: EF 25-30%; c. 09/11/2019 Cath: nonobs LAD/RCA dzs. EF 50-55%; c. 12/2020 Echo: EF 30-35%, glob HK. Mild LVH. Nl RVSP. Mild MR; d. 02/2021 RHC: Elev RH pressures. CO/CI 2.8/1.3; e. 06/2021 cMRI: EF 40%, marked sept-lat dyssynch & septal HK. Small, discrete areas of mid-wall LGE in apical lateral and apical septal walls - ? h/o myocarditis.   Nonobstructive CAD (coronary artery disease)    a. 03/2019 [facility]): EF 63%, ant ischemia; b. 08/2019 Cath: LM nl, LAD 54m, RCA 70p (iFR nl @ 0.97), 50p. EF 50-55%-->Med Rx; c. 06/2020 Cor CTA: Nl FFRs throughout cor tree.   Osteoporosis    Panic attack    PTSD (post-traumatic stress disorder)    PVC's (premature ventricular contractions)    a. 05/2021 Zio: 20.6% PVC burden. 13.4 secs of NSVT-->amio added; b. 06/2021 Zio: 5.8% PVC burden. Rare supraventrciular ectopy.   Rectal fistula    Sleep apnea    a. Prev wore CPAP but lost insurance and hasn't been using since (now has insurance).   Wears dentures    full upper    Past Surgical History:  Procedure Laterality Date   CARDIAC CATHETERIZATION     COLONOSCOPY  2014   COLONOSCOPY WITH PROPOFOL N/A 10/01/2016   Procedure: COLONOSCOPY WITH PROPOFOL;  Surgeon: Midge Minium, MD;  Location: [facility];  Service: Endoscopy;  Laterality: N/A;   COLONOSCOPY WITH PROPOFOL N/A 01/04/2022   Procedure: COLONOSCOPY WITH PROPOFOL;  Surgeon: Midge Minium, MD;  Location: [facility] END[facility]COPY;  Service: Endoscopy;  Laterality: N/A;   CORONARY PRESSURE/FFR WITH 3D MAPPING N/A 09/11/2019   Procedure: Coronary Pressure Wire/FFR w/3D Mapping;  Surgeon: Yvonne Kendall, MD;  Location: [facility] INV[facility] CV [facility];  Service: Cardiovascular;  Laterality: N/A;   CYSTOCELE REPAIR N/A 12/17/2016   Procedure: ANTERIOR REPAIR (CYSTOCELE);  Surgeon: Herold Harms, MD;  Location: [facility] ORS;   Service: Gynecology;  Laterality: N/A;   ESOPHAGOGASTRODUODENOSCOPY (EGD) WITH PROPOFOL N/A 08/11/2019   Procedure: ESOPHAGOGASTRODUODENOSCOPY (EGD) WITH PROPOFOL;  Surgeon: Midge Minium, MD;  Location: [facility] ENDOSCOPY;  Service: Endoscopy;  Laterality: N/A;   POLYPECTOMY  10/01/2016   Procedure: POLYPECTOMY;  Surgeon: Midge Minium, MD;  Location: [facility];  Service: Endoscopy;;   RECTAL SURGERY  06/28/2015   Rectal prolapse, laparoscopic rectopexy the coldCharmont, MD; [facility]   RIGHT HEART CATH N/A 03/21/2021   Procedure: RIGHT HEART CATH;  Surgeon: Yvonne Kendall, MD;  Location: [facility] INVAS[facility] CV LAB;  Service: Cardiovascular;  Laterality: N/A;   RIGHT/LEFT HEART CATH AND CORONARY ANGIOGRAPHY Bilateral 09/11/2019   Procedure: RIGHT/LEFT HEART CATH AND CORONARY ANGIOGRAPHY;  Surgeon: Antonieta Iba, MD;  Location: [facility] CV [facility];  Service: Cardiovascular;  Laterality: Bilateral;   TOTAL ABDOMINAL HYSTERECTOMY W/ BILATERAL SALPINGOOPHORECTOMY     TUBAL LIGATION     VAGINAL HYSTERECTOMY Bilateral 12/17/2016   Procedure: HYSTERECTOMY VAGINAL WITH BILATERAL SALPINGO OOPHERECTOMY;  Surgeon: Daphine Deutscher  A Defrancesco, MD;  Location: [facility] ORS;  Service: Gynecology;  Laterality: Bilateral;    Current Medications: Current Meds  Medication Sig   amiodarone (PACERONE) 200 MG tablet Take 0.5 tablets (100 mg total) by mouth daily.   aspirin 81 MG tablet Take 81 mg by mouth daily.    atorvastatin (LIPITOR) 10 MG tablet TAKE 1 TABLET BY MOUTH EVERY  NIGHT AT BEDTIME   Continuous Blood Gluc Receiver (FREESTYLE LIBRE 2 READER) DEVI 1 each by Does not apply route daily.   Continuous Glucose Sensor (FREESTYLE LIBRE 2 SENSOR) MISC 1 each by Does not apply route every 14 (fourteen) days.   digoxin (LANOXIN) 0.125 MG tablet Take 1 tablet (0.125 mg total) by mouth daily.   Dulaglutide (TRULICITY) 4.5 MG/0.5ML SOPN Inject 4.5 mg as directed once a week.   gabapentin (NEURONTIN) 300 MG capsule Take 1  capsule (300 mg total) by mouth 3 (three) times daily.   glimepiride (AMARYL) 4 MG tablet Take 1 tablet (4 mg total) by mouth daily with breakfast.   Insulin Glargine (BASAGLAR KWIKPEN) 100 UNIT/ML Inject 80 Units into the skin daily.   insulin lispro (HUMALOG KWIKPEN) 100 UNIT/ML KwikPen Inject 5 Units into the skin 3 (three) times daily.   Insulin Pen Needle 30G X 6 MM MISC 1 Application by Does not apply route daily.   Lancets (ONETOUCH DELICA PLUS LANCET30G) MISC TEST ONCE DAILY   metFORMIN (GLUCOPHAGE) 1000 MG tablet TAKE 1 TABLET BY MOUTH TWICE  DAILY WITH MEALS   metoprolol succinate (TOPROL-XL) 50 MG 24 hr tablet TAKE 1 TABLET BY MOUTH DAILY   nitroGLYCERIN (NITROSTAT) 0.3 MG SL tablet Place 1 tablet (0.3 mg total) under the tongue every 5 (five) minutes as needed for chest pain.   ONETOUCH ULTRA test strip TEST ONCE DAILY   pantoprazole (PROTONIX) 40 MG tablet TAKE 1 TABLET BY MOUTH DAILY   polyethylene glycol powder (GLYCOLAX/MIRALAX) 17 GM/SCOOP powder Take 17 g by mouth daily.   QUEtiapine (SEROQUEL) 200 MG tablet TAKE 1 TABLET BY MOUTH DAILY AT  BEDTIME   spironolactone (ALDACTONE) 25 MG tablet TAKE 1 TABLET BY MOUTH DAILY   torsemide (DEMADEX) 20 MG tablet TAKE 2 TABLETS BY MOUTH DAILY  ALTERNATING WITH TAKE 1 TABLET  DAILY   valsartan (DIOVAN) 40 MG tablet TAKE 1 AND 1/2 TABLETS BY MOUTH  TWICE DAILY    Allergies:   Entresto [sacubitril-valsartan] and Lisinopril   Social History   Socioeconomic History   Marital status: Married    Spouse name: Solicitor   Number of children: 1   Years of education: Not on file   Highest education level: 9th grade  Occupational History    Employer: DISABLED    Comment: for panic attacks  Tobacco Use   Smoking status: Former    Packs/day: 1.50    Years: 50.00    Additional pack years: 0.00    Total pack years: 75.00    Types: Cigarettes    Quit date: 11/29/2018    Years since quitting: 4.2   Smokeless tobacco: Never   Tobacco  comments:    started at age 51  Vaping Use   Vaping Use: Never used  Substance and Sexual Activity   Alcohol use: No    Alcohol/week: 0.0 standard drinks of alcohol   Drug use: No   Sexual activity: Not Currently  Other Topics Concern   Not on file  Social History Narrative   Pt had one son, who died when was 45 YO  in a MVA.   Social Determinants of Health   Financial Resource Strain: Low Risk  (12/10/2022)   Overall Financial Resource Strain (CARDIA)    Difficulty of Paying Living Expenses: Not hard at all  Recent Concern: Financial Resource Strain - High Risk (10/01/2022)   Overall Financial Resource Strain (CARDIA)    Difficulty of Paying Living Expenses: Very hard  Food Insecurity: No Food Insecurity (12/10/2022)   Hunger Vital Sign    Worried About Running Out of Food in the Last Year: Never true    Ran Out of Food in the Last Year: Never true  Transportation Needs: No Transportation Needs (12/10/2022)   PRAPARE - Administrator, Civil Service (Medical): No    Lack of Transportation (Non-Medical): No  Physical Activity: Inactive (12/10/2022)   Exercise Vital Sign    Days of Exercise per Week: 0 days    Minutes of Exercise per Session: 0 min  Stress: No Stress Concern Present (12/10/2022)   [employer] of Occupational Health - Occupational Stress Questionnaire    Feeling of Stress : Not at all  Social Connections: Moderately Isolated (12/10/2022)   Social Connection and Isolation Panel [NHANES]    Frequency of Communication with Friends and Family: Twice a week    Frequency of Social Gatherings with Friends and Family: Once a week    Attends Religious Services: Never    Database administrator or Organizations: No    Attends Engineer, structural: Never    Marital Status: Married     Family History:  The patient's family history includes Cancer in her sister; Diabetes in her brother, brother, mother, sister, and sister; Healthy in her sister;  Heart disease in her brother and father; Hypertension in her father; Lymphoma in her brother and brother. There is no history of Breast cancer, Ovarian cancer, or Colon cancer.  ROS:   12-point review of systems is negative unless otherwise noted in the HPI.   EKGs/Labs/Other Studies Reviewed:    Studies reviewed were summarized above. The additional studies were reviewed today:  2D echo 03/20/2022: 1. Left ventricular ejection fraction, by estimation, is 35 to 40%. The  left ventricle has moderately decreased function. The left ventricle  demonstrates global hypokinesis. Left ventricular diastolic parameters are  consistent with Grade I diastolic  dysfunction (impaired relaxation). There is septal dyssynchrony.   2. Right ventricular systolic function is mildly reduced. The right  ventricular size is normal. Tricuspid regurgitation signal is inadequate  for assessing PA pressure.   3. The mitral valve is grossly normal. Trivial mitral valve  regurgitation. No evidence of mitral stenosis.   4. The aortic valve was not well visualized. Aortic valve regurgitation  is not visualized. No aortic stenosis is present.  __________  Zio patch 06/2021: HR 67 - 117, average 82 bpm. Rare supraventricular ectopy. PVC's were present, 5.8%. No sustained arrhythmias. Improved PVC ablation. __________  Cardiac MRI 07/06/2021: IMPRESSION: 1. Normal left ventricular size with marked septal-lateral dyssynchrony suggestive of LBBB and septal hypokinesis, EF 40%.   2.  Normal right ventricular size and systolic function, EF 45%.   3. Small discrete areas of mid-wall LGE in the apical lateral and apical septal walls. This is not a coronary disease pattern. Possible prior viral myocarditis.   4. Extracellular fluid volume percentage is in normal range, 26% in the septum. __________  [facility] patch 05/2021: Patient had a min HR of 66 bpm, max HR of 179 bpm, and avg HR  of 93 bpm. Predominant underlying  rhythm was Sinus Rhythm. 1 run of Ventricular Tachycardia occurred lasting 13.4 secs with a max rate of 179 bpm (avg 136 bpm). 1 run of Supraventricular  Tachycardia occurred lasting 6 beats with a max rate of 141 bpm (avg 136 bpm). Isolated SVEs were rare (<1.0%), and no SVE Couplets or SVE Triplets were present. Isolated VEs were frequent (20.6%, [id number]), VE Couplets were rare (<1.0%, 2692), and VE  Triplets were rare (<1.0%, 754). Ventricular Bigeminy and Trigeminy were present.    Conclusion: 1. Predominantly NSR.  2. No atrial fibrillation noted.  3. 20.6% PVCs, one predominant type.  4. 13.4 second run NSVT.  5. 6 beat run of possible atrial tachycardia.  __________  RHC 03/21/2021: Conclusions: Mildly elevated left heart filling pressure (mean PCWP 20 mmHg). Severely elevated right heart filling pressure (mean RAP 17 mmHg, RVEDP 19 mmHg). Mildly elevated pulmonary artery pressure (mean PAP 28 mmHg) with increased pulmonary vascular resistance (PVR 2.9 WU). Severely reduced Fick cardiac output/index (CO 2.9, CI 1.3).  Low PAPI suggests significant component of right heart failure.   Recommendations: Increase torsemide to 20 mg daily. Decrease metoprolol succinate to 25 mg daily. Refer to advanced heart failure clinic for expedited evaluation. __________   2D echo 12/20/2020: 1. Left ventricular ejection fraction, by estimation, is 30 to 35%. The  left ventricle has moderately decreased function. The left ventricle  demonstrates global hypokinesis. There is mild left ventricular  hypertrophy. Left ventricular diastolic  parameters are indeterminate.   2. Right ventricular systolic function is normal. The right ventricular  size is normal. Tricuspid regurgitation signal is inadequate for assessing  PA pressure.   3. The mitral valve is normal in structure. Mild mitral valve  regurgitation. No evidence of mitral stenosis. __________   2D echo 09/09/2020: 1. Left ventricular  ejection fraction, by estimation, is 30 to 35%. The  left ventricle has moderately decreased function. The left ventricle  demonstrates regional wall motion abnormalities (Anterior and anteroseptal  wall hypokinesis). The average left  ventricular global longitudinal strain is -6.8 %. The global longitudinal  strain is abnormal.   2. Right ventricular systolic function is normal. The right ventricular  size is normal. Tricuspid regurgitation signal is inadequate for assessing  PA pressure.   3. The mitral valve is normal in structure. Mild mitral valve  regurgitation.  __________   Coronary CTA 06/30/2020: Aorta:  Normal size.  Mild aortic atherosclerosis.  No dissection.   Aortic Valve:  Trileaflet.  No calcifications.   Coronary Arteries:  Normal coronary origin.  Right dominance.   RCA is a large dominant artery that gives rise to PDA and PLVB. There is mild (25-49%) soft plaque stenosis in the proximal to mid vessel followed by mild (25-49%) mixed plaque stenosis.   Left main is a large artery that gives rise to LAD and LCX arteries.   LAD is a large vessel that gives rise to medium size D1 and D2 and small D3; there is long, soft plaque stenosis in the mid vessel mild (25-49%) followed by moderate (50-69%).   LCX is a non-dominant artery that gives rise to one large OM1 branch. There is no plaque.   Other findings:   Normal pulmonary vein drainage into the left atrium.   Normal let atrial appendage without a thrombus.   Normal size of the pulmonary artery.   IMPRESSION: 1. Coronary calcium score of 39.3. This was 18 percentile for age and sex matched control.  2. Normal coronary origin with right dominance.   3. Moderate (50-69%) soft plaque stenosis in the mid LAD; mild (25-49%) soft and mixed plaque stenoses in the proximal to mid RCA.   4. Study will be sent for FFR. __________   FFR 07/01/2020: 1. Left Main: findings 0.99, 0.99   2. LAD: findings 0.99,  0.97 0.92   3. OM: Findings 0.98, 0.88 0.84   4. RCA: findings 0.97, 0.81 0.84   IMPRESSION: FFR not suggestive of significant obstructive disease. __________   Coronary CTA 06/09/2020:  Unable to be performed secondary to heart rate -Calcium score 2.67 which places the patient in the 49th percentile for age and sex matched control __________   2D echo 05/30/2020: 1. Left ventricular ejection fraction, by estimation, is 30 to 35%. The  left ventricle has moderately decreased function. Left ventricular  endocardial border not optimally defined to evaluate regional wall motion.  Left ventricular diastolic parameters  are consistent with Grade II diastolic dysfunction (pseudonormalization).   2. Right ventricular systolic function was not well visualized. The right  ventricular size is normal. Tricuspid regurgitation signal is inadequate  for assessing PA pressure.   3. The mitral valve is normal in structure. Mild mitral valve  regurgitation. No evidence of mitral stenosis.   4. The aortic valve was not well visualized. Aortic valve regurgitation  is not visualized. No aortic stenosis is present.   5. The inferior vena cava is normal in size with greater than 50%  respiratory variability, suggesting right atrial pressure of 3 mmHg.   6. Challenging image quality. __________   2D echo 08/2019: 1. Left ventricular ejection fraction, by visual estimation, is 25 to  30%. The left ventricle has moderate to severely decreased function. There  is no left ventricular hypertrophy.   2. Left ventricular diastolic parameters are consistent with Grade I  diastolic dysfunction (impaired relaxation).   3. The left ventricle demonstrates global hypokinesis.   4. Global right ventricle has normal systolic function.The right  ventricular size is normal. No increase in right ventricular wall  thickness.   5. Left atrial size was normal.   6. TR signal is inadequate for assessing pulmonary artery  systolic  Pressure. __________   [facility] 08/2019: Coronary angiography:  Coronary dominance: Right  Left mainstem:   Large vessel that bifurcates into the LAD and left circumflex, no significant disease noted  Left anterior descending (LAD):   Large vessel that extends to the apical region, diagonal branch 2 of moderate size, mild proximal LAD disease estimated at 30%  Left circumflex (LCx):  Large vessel with OM branch 2, no significant disease noted  Right coronary artery (RCA):  Right dominant vessel with PL and PDA, 70% proximal RCA disease followed by sequential 50% lesion proximal to mid vessel  Left ventriculography: Left ventricular systolic function is normal, LVEF is estimated at 50 to 55%, there is no significant mitral regurgitation , no significant aortic valve stenosis  Right heart pressures RA 10 RV 29/8, 11 PA 27/16 mean 20 Wedge 12 Cardiac output 5.04 Cardiac index 2.51  Final Conclusions:   Single-vessel disease RCA Discussed with interventional cardiology, they will perform FFR to determine if intervention needed Right heart pressures are normal   Recommendations:  We will defer coronary intervention to interventional cardiology Needs weight loss, management of COPD Management of hyperlipidemia __________   iFR 08/2019:   Conclusions: Single vessel coronary with sequential 70% and 50% proximal and mid RCA stenoses that are not hemodynamically  significant (iFR = 0.97).  See Dr. Windell Hummingbird diagnostic catheterization report for further details of right/left heart catheterization.   Recommendations: Medical therapy and risk factor modification to prevent progression of disease.    EKG:  EKG is ordered today.  The EKG ordered today demonstrates NSR, 80 bpm, LBBB  Recent Labs: 06/22/2022: TSH 8.726 08/22/2022: B Natriuretic Peptide 19.8 01/15/2023: ALT 132; BUN 23; Creatinine, Ser 0.99; Hemoglobin 11.3; Platelets 275; Potassium 4.9; Sodium 137  Recent  Lipid Panel    Component Value Date/Time   CHOL 80 (L) 11/29/2022 0910   TRIG 173 (H) 11/29/2022 0910   HDL 33 (L) 11/29/2022 0910   CHOLHDL 2.4 11/29/2022 0910   CHOLHDL 1.7 01/01/2018 0000   VLDL 29 08/06/2016 0824   LDLCALC 19 11/29/2022 0910   LDLCALC 29 01/01/2018 0000    PHYSICAL EXAM:    VS:  BP 98/60   Pulse 82   Ht 5\' 2"  (1.575 m)   Wt 216 lb 12.8 oz (98.3 kg)   SpO2 93%   BMI 39.65 kg/m   BMI: Body mass index is 39.65 kg/m.  Physical Exam Vitals reviewed.  Constitutional:      Appearance: She is well-developed.  HENT:     Head: Normocephalic and atraumatic.  Eyes:     General:        Right eye: No discharge.        Left eye: No discharge.  Neck:     Vascular: No JVD.  Cardiovascular:     Rate and Rhythm: Normal rate and regular rhythm.     Pulses:          Dorsalis pedis pulses are 2+ on the right side and 2+ on the left side.       Posterior tibial pulses are 2+ on the right side and 2+ on the left side.     Heart sounds: Normal heart sounds, S1 normal and S2 normal. Heart sounds not distant. No midsystolic click and no opening snap. No murmur heard.    No friction rub.  Pulmonary:     Effort: Pulmonary effort is normal. No respiratory distress.     Breath sounds: Normal breath sounds. No decreased breath sounds, wheezing or rales.  Chest:     Chest wall: No tenderness.  Abdominal:     General: There is no distension.     Palpations: Abdomen is soft.     Tenderness: There is no abdominal tenderness.  Musculoskeletal:     Cervical back: Normal range of motion.     Right lower leg: No edema.     Left lower leg: No edema.  Skin:    General: Skin is warm and dry.     Nails: There is no clubbing.  Neurological:     Mental Status: She is alert and oriented to person, place, and time.  Psychiatric:        Speech: Speech normal.        Behavior: Behavior normal.        Thought Content: Thought content normal.        Judgment: Judgment normal.      Wt Readings from Last 3 Encounters:  03/01/23 216 lb 12.8 oz (98.3 kg)  02/28/23 215 lb 6.4 oz (97.7 kg)  02/04/23 214 lb 4.8 oz (97.2 kg)     ASSESSMENT & PLAN:   HFrEF secondary to NICM: She is euvolemic and well compensated with NYHA class II-III symptoms.  Historically, she has been followed by the  advanced heart failure service.  She was unaware that they now come out to the [place] area.  We will have her reestablish with the advanced heart failure service moving forward.  With noted orthostasis we will decrease valsartan to 40 mg twice daily with otherwise continuing Toprol-XL 50 mg daily, spironolactone 25 mg daily, digoxin 0.125 mg daily, and torsemide 40 mg alternating with 20 mg daily.  Digoxin level normal earlier this year.  Not on Entresto secondary to cough.  Not on SGLT2 inhibitor secondary to recurrent yeast infection.  QRS not wide enough for CRT.  CHF education.  Nonobstructive CAD: No symptoms suggestive of angina.  She remains on aspirin and atorvastatin.  Frequent PVCs: Burden improved most recent cardiac monitoring at 5.8%.  She remains on Toprol-XL and amiodarone 100 mg daily.  TSH mildly elevated with normal free T4 and free T3.  LFTs mildly abnormal, possibly in setting of NAFLD.  Elevated LFTs: Chronic and mild.  Elevation predates amiodarone use.  Amiodarone does not seem to have worsened LFTs.  Suspect NAFLD.  Prior viral hepatitis workup negative.  Follows with GI.  HLD: LDL 19 in 11/2022.  She is mains on atorvastatin 10 mg.  COPD: Stable.  Obesity: Weight loss encouraged heartily diet and regular exercise.  Sleep study from 05/2021 showed no evidence of central or obstructive sleep apnea.       Disposition: F/u with advanced heart failure service.   Medication Adjustments/Labs and Tests Ordered: Current medicines are reviewed at length with the patient today.  Concerns regarding medicines are outlined above. Medication changes, Labs and Tests  ordered today are summarized above and listed in the Patient Instructions accessible in Encounters.   Signed, Eula Listen, PA-C 03/01/2023 4:56 PM     [facility] [address] [place], [place] [address] [phone]

## 2023-02-28 NOTE — Assessment & Plan Note (Signed)
Chronic.  Not well controlled.  Will continue to let Endocrinology manage medications.  She will be switched from Trulicity to Ozempic.  Continue with long and short acting insulin.  Follow up in 5 months.  Call sooner if concerns arise.

## 2023-02-28 NOTE — Assessment & Plan Note (Signed)
Chronic. Blood pressure a little soft in the office today.  Will send DME order for blood pressure cuff.  Recommend checking blood pressures at home. If <100/70s, call the office.  Follow up in 5 months.  Will have labs done with Endocrinology. 

## 2023-02-28 NOTE — Assessment & Plan Note (Signed)
Chronic. Blood pressure a little soft in the office today.  Will send DME order for blood pressure cuff.  Recommend checking blood pressures at home. If <100/70s, call the office.  Follow up in 5 months.  Will have labs done with Endocrinology.

## 2023-03-01 ENCOUNTER — Telehealth: Payer: Self-pay | Admitting: Nurse Practitioner

## 2023-03-01 ENCOUNTER — Ambulatory Visit: Payer: Medicare Other | Attending: Physician Assistant | Admitting: Physician Assistant

## 2023-03-01 ENCOUNTER — Ambulatory Visit: Payer: Medicare Other

## 2023-03-01 ENCOUNTER — Encounter: Payer: Self-pay | Admitting: Physician Assistant

## 2023-03-01 VITALS — BP 98/60 | HR 82 | Ht 62.0 in | Wt 216.8 lb

## 2023-03-01 DIAGNOSIS — I493 Ventricular premature depolarization: Secondary | ICD-10-CM

## 2023-03-01 DIAGNOSIS — R7989 Other specified abnormal findings of blood chemistry: Secondary | ICD-10-CM

## 2023-03-01 DIAGNOSIS — I428 Other cardiomyopathies: Secondary | ICD-10-CM

## 2023-03-01 DIAGNOSIS — I5022 Chronic systolic (congestive) heart failure: Secondary | ICD-10-CM

## 2023-03-01 DIAGNOSIS — E785 Hyperlipidemia, unspecified: Secondary | ICD-10-CM | POA: Diagnosis not present

## 2023-03-01 DIAGNOSIS — I2510 Atherosclerotic heart disease of native coronary artery without angina pectoris: Secondary | ICD-10-CM | POA: Diagnosis not present

## 2023-03-01 NOTE — Telephone Encounter (Signed)
Copied from CRM [id number]. Topic: General - Other >> Mar 01, 2023  8:59 AM Carrielelia G wrote: Reason for CRM:  pt is calling to get a blood pressure monitor  or a script for it

## 2023-03-01 NOTE — Patient Instructions (Signed)
Medication Instructions:  No changes at this time.   *If you need a refill on your cardiac medications before your next appointment, please call your pharmacy*   Lab Work: None  If you have labs (blood work) drawn today and your tests are completely normal, you will receive your results only by: MyChart Message (if you have MyChart) OR A paper copy in the mail If you have any lab test that is abnormal or we need to change your treatment, we will call you to review the results.   Testing/Procedures: None   Follow-Up: At [facility], you and your health needs are our priority.  As part of our continuing mission to provide you with exceptional heart care, we have created designated Provider Care Teams.  These Care Teams include your primary Cardiologist (physician) and Advanced Practice Providers (APPs -  Physician Assistants and Nurse Practitioners) who all work together to provide you with the care you need, when you need it.   Your next appointment:   1-2 month(s)  Provider:   Dr. Shirlee Latch

## 2023-03-01 NOTE — Telephone Encounter (Signed)
Order signed and faxed to [employer].   Patient notified that this was done for her.

## 2023-03-07 DIAGNOSIS — Z794 Long term (current) use of insulin: Secondary | ICD-10-CM | POA: Diagnosis not present

## 2023-03-07 DIAGNOSIS — E1165 Type 2 diabetes mellitus with hyperglycemia: Secondary | ICD-10-CM | POA: Diagnosis not present

## 2023-03-10 ENCOUNTER — Other Ambulatory Visit: Payer: Self-pay | Admitting: Nurse Practitioner

## 2023-03-11 ENCOUNTER — Ambulatory Visit: Payer: Medicare Other

## 2023-03-11 ENCOUNTER — Telehealth: Payer: Self-pay | Admitting: Nurse Practitioner

## 2023-03-11 ENCOUNTER — Telehealth: Payer: Medicare Other

## 2023-03-11 NOTE — Telephone Encounter (Signed)
Copied from CRM [id number]. Topic: General - Inquiry >> Mar 08, 2023  4:12 PM Marlow Baars wrote: Reason for CRM: Kara Mead with [facility] called in stating she faxed over an order for the provider to sign back on May 7th. It is a lab order that she needs signed for Uti test. Please assist as soon as possible as Kara Mead states she needs this by Monday to process. She is going to refax it back over but needs as soon as possible. Please assist further

## 2023-03-11 NOTE — Patient Outreach (Signed)
  Care Management   Follow Up Note   03/11/2023 Name: Kim Buchanan MRN: [id number] DOB: 23-Mar-1951   Referred by: [name], NP Reason for referral : Care Coordination   An unsuccessful telephone outreach was attempted today. The patient was referred to the case management team for assistance with care management and care coordination.   Follow Up Plan: Telephone follow up appointment with care management team member scheduled for: June  -Called patient for f/u. She was in the car leaving [employer]. She stated she did not forget the appt, but that she had errands to run and asked if I could call her this evening.   Artelia Laroche, Pharm.D. - [phone]

## 2023-03-11 NOTE — Telephone Encounter (Unsigned)
Copied from CRM [id number]. Topic: General - Other >> Mar 11, 2023  4:34 PM Patsy Lager T wrote: Reason for CRM: Kara Mead from [facility] called stated she has faxed a form to [phone] on 5/7 and 5/17 that needs to be signed and returned asap but not showing the form was ever recvd. Please contact her to give an alternate fax or email address to recv the form

## 2023-03-11 NOTE — Telephone Encounter (Signed)
Requested Prescriptions  Pending Prescriptions Disp Refills   pantoprazole (PROTONIX) 40 MG tablet [Pharmacy Med Name: Pantoprazole Sodium 40 MG Oral Tablet Delayed Release] 100 tablet 2    Sig: TAKE 1 TABLET BY MOUTH DAILY     Gastroenterology: Proton Pump Inhibitors Passed - 03/11/2023  8:29 AM      Passed - Valid encounter within last 12 months    Recent Outpatient Visits           1 week ago Hypertension associated with diabetes [facility])   [place] [facility] [name], NP   1 month ago Encounter for screening mammogram for malignant neoplasm of breast   [place] [facility] [name], NP   1 month ago Right lower quadrant abdominal pain   [facility] [name], NP   2 months ago Right lower quadrant abdominal pain   [place] [facility] [name], NP   3 months ago Aortic atherosclerosis [facility])   [place] [facility] [name], NP       Future Appointments             In 5 months [name], NP [place] [facility]             metFORMIN (GLUCOPHAGE) 1000 MG tablet [Pharmacy Med Name: metFORMIN HCl 1000 MG Oral Tablet] 180 tablet 1    Sig: TAKE 1 TABLET BY MOUTH TWICE  DAILY WITH MEALS     Endocrinology:  Diabetes - Biguanides Failed - 03/11/2023  8:29 AM      Failed - HBA1C is between 0 and 7.9 and within 180 days    HB A1C (BAYER DCA - WAIVED)  Date Value Ref Range Status  01/23/2021 8.6 (H) <7.0 % Final    Comment:                                          Diabetic Adult            <7.0                                       Healthy Adult        4.3 - 5.7                                                           (DCCT/NGSP) American Diabetes Association's Summary of Glycemic Recommendations for Adults with Diabetes: Hemoglobin A1c <7.0%. More stringent glycemic goals (A1c <6.0%) may further reduce complications at  the cost of increased risk of hypoglycemia.    Hgb A1c MFr Bld  Date Value Ref Range Status  11/29/2022 9.5 (H) 4.8 - 5.6 % Final    Comment:             Prediabetes: 5.7 - 6.4          Diabetes: >6.4          Glycemic control for adults with diabetes: <7.0    A1c  Date Value Ref Range Status  12/29/2022 7.8%  Final    Comment:  Abstracted by HIM         Passed - Cr in normal range and within 360 days    Creat  Date Value Ref Range Status  01/01/2018 0.82 0.50 - 0.99 mg/dL Final    Comment:    For patients >52 years of age, the reference limit for Creatinine is approximately 13% higher for people identified as African-American. .    Creatinine, Ser  Date Value Ref Range Status  01/15/2023 0.99 0.57 - 1.00 mg/dL Final         Passed - eGFR in normal range and within 360 days    GFR, Est African American  Date Value Ref Range Status  01/01/2018 88 > OR = 60 mL/min/1.54m2 Final   GFR calc Af Amer  Date Value Ref Range Status  10/17/2020 79 >59 mL/min/1.73 Final    Comment:    **In accordance with recommendations from the NKF-ASN Task force,**   [facility] is in the process of updating its eGFR calculation to the   2021 CKD-EPI creatinine equation that estimates kidney function   without a race variable.    GFR, Est Non African American  Date Value Ref Range Status  01/01/2018 76 > OR = 60 mL/min/1.75m2 Final   GFR, Estimated  Date Value Ref Range Status  11/20/2022 >60 >60 mL/min Final    Comment:    (NOTE) Calculated using the CKD-EPI Creatinine Equation (2021)    eGFR  Date Value Ref Range Status  01/15/2023 62 >59 mL/min/1.73 Final         Passed - B12 Level in normal range and within 720 days    Vitamin B-12  Date Value Ref Range Status  11/16/2022 494 232 - 1,245 pg/mL Final         Passed - Valid encounter within last 6 months    Recent Outpatient Visits           1 week ago Hypertension associated with diabetes (HCC)   [place]  [facility] [name], NP   1 month ago Encounter for screening mammogram for malignant neoplasm of breast   [place] [facility] [name], NP   1 month ago Right lower quadrant abdominal pain   [place] [facility] [place], Clydie Braun, NP   2 months ago Right lower quadrant abdominal pain   [place] [facility] [name], NP   3 months ago Aortic atherosclerosis [facility])   [place] [facility] [name], NP       Future Appointments             In 5 months [name], NP [place] [facility]            Passed - CBC within normal limits and completed in the last 12 months    WBC  Date Value Ref Range Status  01/15/2023 7.0 3.4 - 10.8 x10E3/uL Final  11/20/2022 6.7 4.0 - 10.5 K/uL Final   RBC  Date Value Ref Range Status  01/15/2023 4.03 3.77 - 5.28 x10E6/uL Final  11/20/2022 4.27 3.87 - 5.11 MIL/uL Final   Hemoglobin  Date Value Ref Range Status  01/15/2023 11.3 11.1 - 15.9 g/dL Final   Hematocrit  Date Value Ref Range Status  01/15/2023 36.2 34.0 - 46.6 % Final   MCHC  Date Value Ref Range Status  01/15/2023 31.2 (L) 31.5 - 35.7 g/dL Final  16/07/9603 54.0 30.0 - 36.0 g/dL Final  [facility]  Date Value Ref Range Status  01/15/2023 28.0 26.6 - 33.0 pg Final  11/20/2022 29.0 26.0 - 34.0 pg Final   MCV  Date Value Ref Range Status  01/15/2023 90 79 - 97 fL Final  02/24/2013 95 80 - 100 fL Final   No results found for: "PLTCOUNTKUC", "LABPLAT", "POCPLA" RDW  Date Value Ref Range Status  01/15/2023 13.9 11.7 - 15.4 % Final  02/24/2013 13.0 11.5 - 14.5 % Final

## 2023-03-12 ENCOUNTER — Telehealth: Payer: Self-pay

## 2023-03-12 NOTE — Progress Notes (Cosign Needed Addendum)
Care Management & Coordination Services Pharmacy Team  Reason for Encounter: Diabetes BS readings  Contacted patient to discuss diabetes disease state. Spoke with patient on 03/12/2023. Patient appointment has been reschedule for 04/08/23.  Current antihyperglycemic regimen:  Humalog 100 units Metformin 1000mg     Patient verbally confirms she is taking the above medications as directed. Yes   What recent interventions/DTPs have been made to improve glycemic control:  Patient is not taking Trulicity anymore but switched to Ozempic. Patient assistance started for patient    How often are you checking your blood sugar? twice daily  What are your blood sugars ranging? Patient states that BS is very hig at times 5-14 238 5-15 218 5-16 271 5-17 228, 237 5-18 331   Adherence Review: Is the patient currently on a STATIN medication? Yes Is the patient currently on ACE/ARB medication? Yes Does the patient have >5 day gap between last estimated fill dates? No    Tracy Ellis,CMA sig

## 2023-03-19 ENCOUNTER — Ambulatory Visit: Payer: Medicare Other | Admitting: Nurse Practitioner

## 2023-03-19 ENCOUNTER — Ambulatory Visit: Payer: Medicare Other

## 2023-03-19 NOTE — Progress Notes (Signed)
Patient presented to the office for assistance with applying Freestyle Libre sensor. Assisted patient with applying the sensor to the right arm. Patient tolerated well and had no questions.

## 2023-03-19 NOTE — Progress Notes (Cosign Needed)
I have contacted Dr. Jayme Cloud Endocrinology to see if patients Humalog can be increased from 5 TID to 6 TID. I relayed the message to staff they stated they will send this information to the Nurse and will contact me as soon as they can.   Rance Muir, RMA

## 2023-03-20 ENCOUNTER — Ambulatory Visit
Admission: RE | Admit: 2023-03-20 | Discharge: 2023-03-20 | Disposition: A | Discharge status: Home / Self Care | Payer: Medicare Other | Source: Ambulatory Visit | Attending: Nurse Practitioner | Admitting: Nurse Practitioner

## 2023-03-20 ENCOUNTER — Ambulatory Visit (HOSPITAL_BASED_OUTPATIENT_CLINIC_OR_DEPARTMENT_OTHER): Payer: Medicare Other | Admitting: Cardiology

## 2023-03-20 ENCOUNTER — Other Ambulatory Visit
Admission: RE | Admit: 2023-03-20 | Discharge: 2023-03-20 | Disposition: A | Discharge status: Home / Self Care | Payer: Medicare Other | Source: Ambulatory Visit | Attending: Nurse Practitioner | Admitting: Nurse Practitioner

## 2023-03-20 ENCOUNTER — Encounter: Payer: Self-pay | Admitting: Cardiology

## 2023-03-20 VITALS — BP 109/55 | HR 70 | Wt 217.0 lb

## 2023-03-20 DIAGNOSIS — I5022 Chronic systolic (congestive) heart failure: Secondary | ICD-10-CM | POA: Insufficient documentation

## 2023-03-20 DIAGNOSIS — Z1231 Encounter for screening mammogram for malignant neoplasm of breast: Secondary | ICD-10-CM | POA: Diagnosis not present

## 2023-03-20 LAB — COMPREHENSIVE METABOLIC PANEL
ALT: 73 U/L — ABNORMAL HIGH (ref 0–44)
AST: 147 U/L — ABNORMAL HIGH (ref 15–41)
Albumin: 3.7 g/dL (ref 3.5–5.0)
Alkaline Phosphatase: 201 U/L — ABNORMAL HIGH (ref 38–126)
Anion gap: 12 (ref 5–15)
BUN: 30 mg/dL — ABNORMAL HIGH (ref 8–23)
CO2: 23 mmol/L (ref 22–32)
Calcium: 8.7 mg/dL — ABNORMAL LOW (ref 8.9–10.3)
Chloride: 105 mmol/L (ref 98–111)
Creatinine, Ser: 1.07 mg/dL — ABNORMAL HIGH (ref 0.44–1.00)
GFR, Estimated: 56 mL/min — ABNORMAL LOW (ref >60)
Glucose, Bld: 138 mg/dL — ABNORMAL HIGH (ref 70–99)
Potassium: 4.7 mmol/L (ref 3.5–5.1)
Sodium: 140 mmol/L (ref 135–145)
Total Bilirubin: 1.1 mg/dL (ref 0.3–1.2)
Total Protein: 7.9 g/dL (ref 6.5–8.1)

## 2023-03-20 LAB — DIGOXIN LEVEL: Digoxin Level: 1.1 ng/mL (ref 0.8–2.0)

## 2023-03-20 LAB — T4, FREE: Free T4: 1.08 ng/dL (ref 0.61–1.12)

## 2023-03-20 LAB — TSH: TSH: 18.047 u[IU]/mL — ABNORMAL HIGH (ref 0.350–4.500)

## 2023-03-20 NOTE — Progress Notes (Signed)
PCP: [name], NP Cardiology: Dr. Mariah Milling HF Cardiology: Dr. Shirlee Latch  72 y.o. with history of chronic systolic CHF/nonischemic cardiomyopathy, type 2 diabetes, and prior smoking was referred by Dr. Lalla Brothers for evaluation of CHF.  She has been noted to have a cardiomyopathy since 2020.  Echo in 11/20 showed EF 25-30%.  LHC in 11/20 and coronary CTA in 9/21 have shown nonobstructive CAD.  She has been evaluated by pulmonary, mild restrictive lung disease and mild COPD.  She quit smoking in 2020, she does not drink ETOH.   Echo in 3/22 showed EF 30-35%, with normal RV.   RHC in 5/22 was worrisome, showing right > left heart failure and low cardiac output with low PAPI.   Zio patch in 8/22 showed 21% PVCs.  Amiodarone was started.  Cardiac MRI in 9/22 showed EF 40%, septal-lateral dyssynchrony, RV EF 45%, LGE in the mid-wall apical lateral and apical septal walls. Repeat Zio patch in 10/22 showed 5.8% PVCs, improved.    Patient stopped [place] after 3 yeast infections.   Echo in 5/23 showed EF 35-40%, mildly decreased RV systolic function, trivial MR.    She returns for followup of CHF.  Some lightheadedness with standing but not severe.  Valsartan was decreased due to orthostatic symptoms with improvement.  No chest pain.  She is short of breath walking longer distances, does not like to climb stairs. No orthopnea/PND.  Her PCP recently started her on semaglutide.   REDS clip 31%  ECG (personally reviewed): NSR, LBBB 126 msec  Labs (6/22): K 4.4, creatinine 0.79 Labs (2/23): K 4.8, creatinine 0.95, LDL 18, TSH 5.37, ALT 102, AST 95, hgb 15.2 Labs (8/23): LDL 74, K 4.3, creatinine 5.28, AST 33, ALT 46 Labs (2/24): LDL 19, TGs 173 Labs (3/24): K 4.9, creatinine 0.99, AST 127, ALT 132  PMH: 1. Chronic systolic CHF: Nonischemic cardiomyopathy.   - Echo (6/20): Normal EF - Cardiolite (6/20): Anterior ischemia.  - Echo (11/20): EF 25-30% - LHC (11/20): 70% pRCA stenosis, FFR negative.  -  Echo (8/21): EF 30-35% - Coronary CTA (9/21): Mild RCA disease, 40-59% mLAD stenosis but CT FFR negative.  - Echo (3/22): EF 30-35% with normal RV - RHC (5/22): mean RA 17, mean PA 28, mean PCWP 20, CI 1.3, PAPI < 1.  - Cardiac MRI (9/22): EF 40%, septal-lateral dyssynchrony, RV EF 45%, LGE in the mid-wall apical lateral and apical septal walls. - Echo (5/23): EF 35-40%, mildly decreased RV systolic function, trivial MR.   2. COPD: Mild.  Quit smoking in 2020.  3. Restrictive lung disease: Mild restriction on PFTs.  4. Type 2 diabetes 5. Coronary artery disease: Nonobstructive.  6. Hyperlipidemia 7. OSA 8. GERD 9. Depression 10. Elevated LFTs: Long-standing.  HCV negative.  11. PVCs: Zio patch in 8/22 showed 21% PVCs.  Amiodarone was started. Repeat Zio patch in 10/22 showed 5.8% PVCs, improved.   12. Sleep study (8/22): No OSA.  13. Elevated LFTs: ?NAFLD - Abdominal US (2/24): Fatty liver.   FH: Brother with "heart surgery," ?CHF.  SH: Married, no ETOH, quit smoking in 2020, retired, lives in [place]  ROS: All systems reviewed and negative except as per HPI.   Current Outpatient Medications  Medication Sig Dispense Refill   amiodarone (PACERONE) 200 MG tablet Take 0.5 tablets (100 mg total) by mouth daily. 45 tablet 3   aspirin 81 MG tablet Take 81 mg by mouth daily.      atorvastatin (LIPITOR) 10 MG tablet TAKE 1  TABLET BY MOUTH EVERY  NIGHT AT BEDTIME 90 tablet 3   Continuous Blood Gluc Receiver (FREESTYLE LIBRE 2 READER) DEVI 1 each by Does not apply route daily. 1 each 0   Continuous Glucose Sensor (FREESTYLE LIBRE 2 SENSOR) MISC 1 each by Does not apply route every 14 (fourteen) days. 12 each 1   digoxin (LANOXIN) 0.125 MG tablet Take 1 tablet (0.125 mg total) by mouth daily. 30 tablet 11   gabapentin (NEURONTIN) 300 MG capsule Take 1 capsule (300 mg total) by mouth 3 (three) times daily. 90 capsule 0   glimepiride (AMARYL) 4 MG tablet Take 1 tablet (4 mg total) by mouth daily  with breakfast. 90 tablet 1   insulin lispro (HUMALOG KWIKPEN) 100 UNIT/ML KwikPen Inject 5 Units into the skin 3 (three) times daily. 15 mL 11   Insulin Pen Needle 30G X 6 MM MISC 1 Application by Does not apply route daily. 100 each 2   Lancets (ONETOUCH DELICA PLUS LANCET30G) MISC TEST ONCE DAILY 100 each 3   metFORMIN (GLUCOPHAGE) 1000 MG tablet TAKE 1 TABLET BY MOUTH TWICE  DAILY WITH MEALS 180 tablet 1   metoprolol succinate (TOPROL-XL) 50 MG 24 hr tablet TAKE 1 TABLET BY MOUTH DAILY 90 tablet 3   nitroGLYCERIN (NITROSTAT) 0.3 MG SL tablet Place 1 tablet (0.3 mg total) under the tongue every 5 (five) minutes as needed for chest pain. 100 tablet 3   ONETOUCH ULTRA test strip TEST ONCE DAILY 100 strip 3   pantoprazole (PROTONIX) 40 MG tablet TAKE 1 TABLET BY MOUTH DAILY 100 tablet 2   polyethylene glycol powder (GLYCOLAX/MIRALAX) 17 GM/SCOOP powder Take 17 g by mouth daily.     QUEtiapine (SEROQUEL) 200 MG tablet TAKE 1 TABLET BY MOUTH DAILY AT  BEDTIME 90 tablet 3   spironolactone (ALDACTONE) 25 MG tablet TAKE 1 TABLET BY MOUTH DAILY 90 tablet 3   torsemide (DEMADEX) 20 MG tablet TAKE 2 TABLETS BY MOUTH DAILY  ALTERNATING WITH TAKE 1 TABLET  DAILY 150 tablet 2   Insulin Glargine (BASAGLAR KWIKPEN) 100 UNIT/ML Inject 80 Units into the skin daily. (Patient not taking: Reported on 03/20/2023)     valsartan (DIOVAN) 40 MG tablet TAKE 1 AND 1/2 TABLETS BY MOUTH  TWICE DAILY (Patient taking differently: Take 40 mg by mouth 2 (two) times daily.) 270 tablet 3   No current facility-administered medications for this visit.   BP (!) 109/55 (BP Location: Left Arm, Patient Position: Sitting)   Pulse 70   Wt 217 lb (98.4 kg)   SpO2 97%   BMI 39.69 kg/m  General: NAD Neck: No JVD, no thyromegaly or thyroid nodule.  Lungs: Distant BS.  CV: Nondisplaced PMI.  Heart regular S1/S2, no S3/S4, no murmur.  No peripheral edema.  No carotid bruit.  Normal pedal pulses.  Abdomen: Soft, nontender, no  hepatosplenomegaly, no distention.  Skin: Intact without lesions or rashes.  Neurologic: Alert and oriented x 3.  Psych: Normal affect. Extremities: No clubbing or cyanosis.  HEENT: Normal.   Assessment/Plan: 1. Chronic systolic CHF: Nonischemic cardiomyopathy.  Echo in 3/22 with EF 30-35%, normal RV.  LHC in 11/20 and coronary CTA in 9/21 showed nonobstructive CAD.  RHC in 5/22 was worrisome with R>L heart failure and low cardiac output as well as low PAPI.  Cardiac MRI in 9/22 showed EF 40%, septal-lateral dyssynchrony, RV EF 45%, LGE in the mid-wall apical lateral and apical septal walls.  This was concerning for possible myocarditis (less likely  amyloidosis or sarcoidosis).  Echo in 5/23 with EF 35-40%, mildly decreased RV systolic function, trivial MR.  Also concern that PVCs may play a role as Zio monitor in 8/22 showed 21% PVCs, improved to 5.8% with amiodarone.  NYHA class III still, not volume overloaded by exam or REDS clip.  I think some of her dyspnea is likely due to COPD.  - EF appears out of ICD range, QRS not wide enough for CRT.  - Continue torsemide 40 mg daily alternating with 20 mg daily with BMET today.  - Continue digoxin 0.125 daily, check level today.  - Continue valsartan 40 mg bid (had to be decreased due to lightheadedness).  She could not tolerate Entresto due to cough.  - Continue spironolactone 25 mg daily.   - Off dapagliflozin with multiple yeast infections.  - Continue Toprol XL to 50 mg daily.  - I will arrange for repeat echo.  2. CAD: nonobstructive by LHC in 11/20 and coronary CTA in 9/21.  - Continue atorvastatin.  - Continue ASA 81 daily.  3. PVCs: 21% PVCs on 8/22 Zio monitor.  ?Playing role in cardiomyopathy. PVCs down to 5.8% in 10/22 on amiodarone.  - Continue amiodarone 100 mg daily.  TSH mildly elevated in past, recheck TSH with free T3 and free T4.  Also has chronic mild elevation in LFTs (see below), check today.  4. Elevated LFTs: This appears to  be chronic and mild.  LFT elevation pre-dates amiodarone use, and amiodarone does not seem to have worsened LFTs.  Suspect NAFLD.  She had workup for viral hepatitis (negative) in the past.   - Follows with GI.  - Recheck LFTs today.   5. COPD: Prior smoker.  I suspect COPD plays a role in her dyspnea. She still has significant exertional shortness of breath but is not volume overloaded.  - I will arrange for PFTs to assess lung function.   Followup with me in 2 months.    Kim Buchanan 03/20/2023

## 2023-03-20 NOTE — Patient Instructions (Addendum)
Medication Changes:    *If you need a refill on your cardiac medications before your next appointment, please call your pharmacy*  Lab Work:  Labs done today, your results will be available in MyChart, we will contact you for abnormal readings.  Testing/Procedures:  Your physician has requested that you have an echocardiogram. Echocardiography is a painless test that uses sound waves to create images of your heart. It provides your doctor with information about the size and shape of your heart and how well your heart's chambers and valves are working. This procedure takes approximately one hour. There are no restrictions for this procedure. Please do NOT wear cologne, perfume, aftershave, or lotions (deodorant is allowed). Please arrive 15 minutes prior to your appointment time.   You have been referred to get a Pulmonary Function Test. They will call you to schedule that appointment.    Follow-Up in:   Your physician recommends that you schedule a follow-up appointment in: 2 months with Dr. Shirlee Latch.    Do the following things EVERYDAY: Weigh yourself in the morning before breakfast. Write it down and keep it in a log. Take your medicines as prescribed Eat low salt foods--Limit salt (sodium) to 2000 mg per day.  Stay as active as you can everyday Limit all fluids for the day to less than 2 liters    Need to Contact us:  If you have any questions or concerns before your next appointment please send [place] a message through [place] or call our office at [phone]

## 2023-03-21 ENCOUNTER — Telehealth (HOSPITAL_COMMUNITY): Payer: Self-pay

## 2023-03-21 ENCOUNTER — Telehealth: Payer: Self-pay

## 2023-03-21 DIAGNOSIS — I5022 Chronic systolic (congestive) heart failure: Secondary | ICD-10-CM

## 2023-03-21 LAB — T3, FREE: T3, Free: 2 pg/mL (ref 2.0–4.4)

## 2023-03-21 MED ORDER — DIGOXIN 125 MCG PO TABS: 0.0625 mg | ORAL_TABLET | Freq: Every day | ORAL | 11 refills | Status: DC

## 2023-03-21 MED ORDER — LEVOTHYROXINE SODIUM 25 MCG PO TABS: 25.0000 ug | ORAL_TABLET | Freq: Every day | ORAL | 1 refills | Status: DC

## 2023-03-21 NOTE — Progress Notes (Signed)
Called pt and notified them of the following:  Per Dr. Shirlee Latch: "Based on her labs, I wanted her to decrease digoxin to 0.0625 mg daily and start Levoxyl 25 mcg daily for hypothyroidism. She needs TSH in 1 month."  Pt had good understanding and no further questions. Lab ordered. Patient to come to [facility] to have them completed.

## 2023-03-21 NOTE — Telephone Encounter (Signed)
-----   Message from Laurey Morale, MD sent at 03/20/2023  3:37 PM EDT ----- 1. Decrease digoxin to 0.0625.  2. Add Levoxyl 25 mcg daily, TSH in 1 month.  3. LFTs are stable.

## 2023-03-21 NOTE — Telephone Encounter (Signed)
Patient advised and verbalized understanding,med list updated to reflect changes.  Meds ordered this encounter  Medications   digoxin (LANOXIN) 0.125 MG tablet    Sig: Take 0.5 tablets (0.0625 mg total) by mouth daily.    Dispense:  15 tablet    Refill:  11    Please cancel all previous orders for current medication. Change in dosage or pill size.

## 2023-03-21 NOTE — Telephone Encounter (Signed)
Based on her labs, I wanted her to decrease digoxin to 0.0625 mg daily and start Levoxyl 25 mcg daily for hypothyroidism.  She needs TSH in 1 month.

## 2023-03-21 NOTE — Progress Notes (Signed)
Please let patient know her Mammogram did not show any evidence of a malignancy.  The recommendation is to repeat the Mammogram in 1 year.  

## 2023-03-22 NOTE — Progress Notes (Cosign Needed)
I have reviewed patients chart about the increase Humalog from Endocrinology. Lelon Frohlich, CMA from [place] [place] [facility] informed the patient of the dose adjustment (6 units TID) per there note on 03/19/23.  Rance Muir, RMA

## 2023-04-01 ENCOUNTER — Telehealth: Payer: Self-pay

## 2023-04-03 DIAGNOSIS — Z794 Long term (current) use of insulin: Secondary | ICD-10-CM | POA: Diagnosis not present

## 2023-04-03 DIAGNOSIS — E1165 Type 2 diabetes mellitus with hyperglycemia: Secondary | ICD-10-CM | POA: Diagnosis not present

## 2023-04-03 DIAGNOSIS — E1159 Type 2 diabetes mellitus with other circulatory complications: Secondary | ICD-10-CM | POA: Diagnosis not present

## 2023-04-03 DIAGNOSIS — I152 Hypertension secondary to endocrine disorders: Secondary | ICD-10-CM | POA: Diagnosis not present

## 2023-04-03 DIAGNOSIS — E1169 Type 2 diabetes mellitus with other specified complication: Secondary | ICD-10-CM | POA: Diagnosis not present

## 2023-04-03 DIAGNOSIS — E785 Hyperlipidemia, unspecified: Secondary | ICD-10-CM | POA: Diagnosis not present

## 2023-04-04 DIAGNOSIS — E1165 Type 2 diabetes mellitus with hyperglycemia: Secondary | ICD-10-CM | POA: Diagnosis not present

## 2023-04-11 ENCOUNTER — Ambulatory Visit
Admission: RE | Admit: 2023-04-11 | Discharge: 2023-04-11 | Disposition: A | Discharge status: Home / Self Care | Payer: Medicare Other | Source: Ambulatory Visit | Attending: Cardiology | Admitting: Cardiology

## 2023-04-11 DIAGNOSIS — I110 Hypertensive heart disease with heart failure: Secondary | ICD-10-CM | POA: Diagnosis not present

## 2023-04-11 DIAGNOSIS — E785 Hyperlipidemia, unspecified: Secondary | ICD-10-CM | POA: Diagnosis not present

## 2023-04-11 DIAGNOSIS — E669 Obesity, unspecified: Secondary | ICD-10-CM | POA: Insufficient documentation

## 2023-04-11 DIAGNOSIS — J449 Chronic obstructive pulmonary disease, unspecified: Secondary | ICD-10-CM | POA: Insufficient documentation

## 2023-04-11 DIAGNOSIS — E119 Type 2 diabetes mellitus without complications: Secondary | ICD-10-CM | POA: Diagnosis not present

## 2023-04-11 DIAGNOSIS — I509 Heart failure, unspecified: Secondary | ICD-10-CM | POA: Insufficient documentation

## 2023-04-11 DIAGNOSIS — I5022 Chronic systolic (congestive) heart failure: Secondary | ICD-10-CM

## 2023-04-11 LAB — ECHOCARDIOGRAM COMPLETE
AR max vel: 2.83 cm2
AV Area VTI: 2.91 cm2
AV Area mean vel: 2.74 cm2
AV Mean grad: 6 mmHg
AV Peak grad: 10.1 mmHg
Ao pk vel: 1.59 m/s
Area-P 1/2: 2.56 cm2
Calc EF: 58.6 %
MV VTI: 2.8 cm2
S' Lateral: 2.9 cm
Single Plane A2C EF: 53.5 %
Single Plane A4C EF: 65.4 %

## 2023-04-11 NOTE — Progress Notes (Signed)
*  PRELIMINARY RESULTS* Echocardiogram 2D Echocardiogram has been performed.  Carolyne Fiscal 04/11/2023, 9:41 AM

## 2023-04-12 ENCOUNTER — Other Ambulatory Visit: Payer: Medicare Other

## 2023-04-14 NOTE — Progress Notes (Signed)
04/14/2023 Name: HERMA UBALLE MRN: [id number] DOB: [birth year]  Chief Complaint  Patient presents with   Medication Management   Kim Buchanan is a 72 y.o. year old female who presented for a telephone visit to transfer care to [facility] PharmD.  Subjective:  Care Team: Primary Care Provider: [name], NP ; Next Scheduled Visit: 10/23 Endocrinologist Dr. Huntley Dec ; Next Scheduled Visit: September CHF visit scheduled 8/1  Medication Access/Adherence Current Pharmacy: [facility] Patient reports affordability concerns with their medications: No  Patient reports access/transportation concerns to their pharmacy: No  Patient reports adherence concerns with their medications:  No    Diabetes: Current medications: glimepiride 4mg  daily, Tresiba 100 units daily, Humalog 9 units TID, Metformin 1,000mg  BID, Ozempic 1mg  weekly -Patient cannot tolerate SGLT2 therapy due to frequent genitourinary infections with treatment -Current glucose readings: FBG in the 200's consistently, per patient -Patient has Libre CGM, but it is not working properly; so she is currently using test strips/lancets/meter to check home BG -Recent visit with Endocrinology and A1 elevated at 11.4; but notes indicate there may have been gaps in medication therapy that could have played a role -Patient receives [place], Humalog, and Ozempic through PAPs  Hypertension: Current medications: valsartan 40mg  BID, torsemide 20mg  2 tablets daily alternating with 1 tablet daily, metoprolol xl 50mg  daily, spironolactone 25mg  daily -Cannot tolerate lisinopril or Entresto -Cardiology recently decreased valsartan dose due to hypotension -BP 5/29 at cardiology OV 109/55  Hyperlipidemia/ASCVD Risk Reduction Current lipid lowering medications: atorvastatin 10mg  daily  Antiplatelet regimen: ASA 81mg  daily   Objective: Lab Results  Component Value Date   HGBA1C 9.5 (H) 11/29/2022   Lab Results   Component Value Date   CREATININE 1.07 (H) 03/20/2023   BUN 30 (H) 03/20/2023   NA 140 03/20/2023   K 4.7 03/20/2023   CL 105 03/20/2023   CO2 23 03/20/2023   Lab Results  Component Value Date   CHOL 80 (L) 11/29/2022   HDL 33 (L) 11/29/2022   LDLCALC 19 11/29/2022   TRIG 173 (H) 11/29/2022   CHOLHDL 2.4 11/29/2022   Medications Reviewed Today     Reviewed by Lenna Gilford, RPH (Pharmacist) on 04/14/23 at 1125  Med List Status: <None>   Medication Order Taking? Sig Documenting Provider Last Dose Status Informant  amiodarone (PACERONE) 200 MG tablet [id number] Yes Take 0.5 tablets (100 mg total) by mouth daily. [place], [place] Taking Active   aspirin 81 MG tablet [id number] Yes Take 81 mg by mouth daily.  [provider] Taking Active Self           Med Note Deretha Emory, [facility]   Fri Jul 19, 2017 12:33 PM)    atorvastatin (LIPITOR) 10 MG tablet [id number] Yes TAKE 1 TABLET BY MOUTH EVERY  NIGHT AT BEDTIME Laurey Morale, MD Taking Active   Continuous Blood Gluc Receiver (FREESTYLE LIBRE 2 READER) DEVI [id number] No 1 each by Does not apply route daily.  Patient not taking: Reported on 04/12/2023   [name], NP Not Taking Active   Continuous Glucose Sensor (FREESTYLE LIBRE 2 SENSOR) [place] [id number] No 1 each by Does not apply route every 14 (fourteen) days.  Patient not taking: Reported on 04/12/2023   [name], NP Not Taking Active   digoxin (LANOXIN) 0.125 MG tablet [id number] Yes Take 0.5 tablets (0.0625 mg total) by mouth daily. Laurey Morale, MD Taking Active   gabapentin (NEURONTIN) 300 MG capsule [id number] Yes Take 1 capsule (300  mg total) by mouth 3 (three) times daily. [name], NP Taking Active Self  glimepiride (AMARYL) 4 MG tablet [id number] Yes Take 1 tablet (4 mg total) by mouth daily with breakfast. [name], NP Taking Active   Insulin Degludec (TRESIBA FLEXTOUCH [place]) [id number] Yes Inject 100 Units into the skin at  bedtime. [provider] Taking Active   insulin lispro (HUMALOG KWIKPEN) 100 UNIT/ML KwikPen [id number] Yes Inject 5 Units into the skin 3 (three) times daily. [name], NP Taking Active            Med Note Littie Deeds, Jaley Yan A   Fri Apr 12, 2023  9:07 AM) 9 units TID  Insulin Pen Needle 30G X 6 MM MISC [id number] Yes 1 Application by Does not apply route daily. [name], NP Taking Active            Med Note Littie Deeds, Aeneas Longsworth A   Fri Apr 12, 2023  9:08 AM) 4 times daily   Lancets [facility] Larose [place] PLUS [place]) MISC [id number] Yes TEST ONCE DAILY [name], NP Taking Active   levothyroxine (LEVOXYL) 25 MCG tablet [id number] Yes Take 1 tablet (25 mcg total) by mouth daily before breakfast. Laurey Morale, MD Taking Active   metFORMIN (GLUCOPHAGE) 1000 MG tablet [id number] Yes TAKE 1 TABLET BY MOUTH TWICE  DAILY WITH MEALS [name], NP Taking Active   metoprolol succinate (TOPROL-XL) 50 MG 24 hr tablet [id number] Yes TAKE 1 TABLET BY MOUTH DAILY Laurey Morale, MD Taking Active   nitroGLYCERIN (NITROSTAT) 0.3 MG SL tablet [id number] Yes Place 1 tablet (0.3 mg total) under the tongue every 5 (five) minutes as needed for chest pain. Jacklynn Ganong, FNP Taking Active   Koren Bound test strip [id number] Yes TEST ONCE DAILY [name], NP Taking Active   pantoprazole (PROTONIX) 40 MG tablet [id number] Yes TAKE 1 TABLET BY MOUTH DAILY [name], NP Taking Active   polyethylene glycol powder (GLYCOLAX/MIRALAX) 17 GM/SCOOP powder [id number] Yes Take 17 g by mouth daily. [provider] Taking Active Self  QUEtiapine (SEROQUEL) 200 MG tablet [id number] Yes TAKE 1 TABLET BY MOUTH DAILY AT  BEDTIME [name], NP Taking Active   Semaglutide, 1 MG/DOSE, (OZEMPIC, 1 MG/DOSE,) 4 MG/3ML SOPN [id number] Yes Inject 1 mg into the skin once a week. [provider] Taking Active   spironolactone (ALDACTONE) 25 MG tablet [id number] Yes TAKE 1  TABLET BY MOUTH DAILY Laurey Morale, MD Taking Active   torsemide (DEMADEX) 20 MG tablet [id number] Yes TAKE 2 TABLETS BY MOUTH DAILY  ALTERNATING WITH TAKE 1 TABLET  DAILY Bensimhon, Bevelyn Buckles, MD Taking Active   valsartan (DIOVAN) 40 MG tablet [id number] Yes TAKE 1 AND 1/2 TABLETS BY MOUTH  TWICE DAILY  Patient taking differently: Take 40 mg by mouth 2 (two) times daily.   Laurey Morale, MD Taking Active            Assessment/Plan:   Diabetes: - Currently uncontrolled - Endo note reflects patient instructed to come back by office for assistance with CGM; if she is unable and it would be easier, I could see her at [facility] to assist - Recommend increasing Ozempic to 2mg  weekly- will consult Endo   Hypertension: - Currently controlled - Educated on slow postural changes to prevent ortho static hypotension - Recommended to check home blood pressure and heart rate daily - Recommend to continue current regimen and regular follow-up with cardiology   Hyperlipidemia/ASCVD Risk Reduction: - Currently controlled.  -  Continue current regimen and regular follow-up with providers.   Lenna Gilford, PharmD, DPLA

## 2023-04-15 ENCOUNTER — Telehealth (HOSPITAL_COMMUNITY): Payer: Self-pay

## 2023-04-15 NOTE — Telephone Encounter (Addendum)
  Pt aware, agreeable, and verbalized understanding  ----- Message from Laurey Morale, MD sent at 04/13/2023  1:54 AM EDT ----- EF 50-55%, near normal

## 2023-04-16 ENCOUNTER — Ambulatory Visit: Payer: Self-pay

## 2023-04-16 NOTE — Telephone Encounter (Signed)
  Chief Complaint: rash to neck  Symptoms: feels dime sized bumpsm moderate paim  Frequency: yest Pertinent Negatives: Patient denies fever Disposition: [] ED /[] Urgent Care (no appt availability in office) / [x] Appointment(In office/virtual)/ []  [place] [facility]/ [] Home Care/ [] Refused Recommended Disposition /[] [place] [facility]/ []  Follow-up with PCP Additional Notes:  Reason for Disposition  [1] Localized rash is very painful AND [2] no fever  Answer Assessment - Initial Assessment Questions 1. APPEARANCE of RASH: "Describe the rash."      Can't see it  2. LOCATION: "Where is the rash located?"      Under neck and side of neck  3. NUMBER: "How many spots are there?"      *No Answer* 4. SIZE: "How big are the spots?" (Inches, centimeters or compare to size of a coin)      Can feel bumps like dimes parts 5. ONSET: "When did the rash start?"      2  6. ITCHING: "Does the rash itch?" If Yes, ask: "How bad is the itch?"  (Scale 0-10; or none, mild, moderate, severe)     no 7. PAIN: "Does the rash hurt?" If Yes, ask: "How bad is the pain?"  (Scale 0-10; or none, mild, moderate, severe)    - NONE (0): no pain    - MILD (1-3): doesn't interfere with normal activities     - MODERATE (4-7): interferes with normal activities or awakens from sleep     - SEVERE (8-10): excruciating pain, unable to do any normal activities     Soreness 5 8. OTHER SYMPTOMS: "Do you have any other symptoms?" (e.g., fever)     no 9. PREGNANCY: "Is there any chance you are pregnant?" "When was your last menstrual period?"     *No Answer*  Protocols used: Rash or Redness - Localized-A-AH

## 2023-04-17 ENCOUNTER — Encounter: Payer: Self-pay | Admitting: Nurse Practitioner

## 2023-04-17 ENCOUNTER — Ambulatory Visit (INDEPENDENT_AMBULATORY_CARE_PROVIDER_SITE_OTHER): Payer: Medicare Other | Admitting: Nurse Practitioner

## 2023-04-17 VITALS — BP 100/57 | HR 81 | Temp 98.5°F | Wt 220.8 lb

## 2023-04-17 DIAGNOSIS — L249 Irritant contact dermatitis, unspecified cause: Secondary | ICD-10-CM

## 2023-04-17 DIAGNOSIS — E039 Hypothyroidism, unspecified: Secondary | ICD-10-CM | POA: Diagnosis not present

## 2023-04-17 MED ORDER — TRIAMCINOLONE ACETONIDE 0.1 % EX CREA: 1.0000 | TOPICAL_CREAM | Freq: Two times a day (BID) | CUTANEOUS | 1 refills | Status: DC

## 2023-04-17 NOTE — Progress Notes (Signed)
BP (!) 100/57   Pulse 81   Temp 98.5 F (36.9 C) (Oral)   Wt 220 lb 12.8 oz (100.2 kg)   SpO2 95%   BMI 40.38 kg/m    Subjective:    Patient ID: Kim Buchanan, female    DOB: [birth year], 72 y.o.   MRN: [id number]  HPI: Kim Buchanan is a 72 y.o. female  Chief Complaint  Patient presents with   Rash    Pt states she has a rash on the left side of her neck that is painful. States the areas do not itch.    Patient presents to clinic today with complaints of a rash on the left side of her neck.  States the area doesn't itch and is not painful.  She has been picking at it some.  Doesn't seem to be spreading.  Patient also requesting to have her thyroid labs rechecked today.     Relevant past medical, surgical, family and social history reviewed and updated as indicated. Interim medical history since our last visit reviewed. Allergies and medications reviewed and updated.  Review of Systems  Skin:  Positive for rash.    Per HPI unless specifically indicated above     Objective:    BP (!) 100/57   Pulse 81   Temp 98.5 F (36.9 C) (Oral)   Wt 220 lb 12.8 oz (100.2 kg)   SpO2 95%   BMI 40.38 kg/m   Wt Readings from Last 3 Encounters:  04/17/23 220 lb 12.8 oz (100.2 kg)  03/20/23 217 lb (98.4 kg)  03/01/23 216 lb 12.8 oz (98.3 kg)    Physical Exam Vitals and nursing note reviewed.  Constitutional:      General: She is not in acute distress.    Appearance: Normal appearance. She is normal weight. She is not ill-appearing, toxic-appearing or diaphoretic.  HENT:     Head: Normocephalic.     Right Ear: External ear normal.     Left Ear: External ear normal.     Nose: Nose normal.     Mouth/Throat:     Mouth: Mucous membranes are moist.     Pharynx: Oropharynx is clear.  Eyes:     General:        Right eye: No discharge.        Left eye: No discharge.     Extraocular Movements: Extraocular movements intact.     Conjunctiva/sclera: Conjunctivae normal.      Pupils: Pupils are equal, round, and reactive to light.  Cardiovascular:     Rate and Rhythm: Normal rate and regular rhythm.     Heart sounds: No murmur heard. Pulmonary:     Effort: Pulmonary effort is normal. No respiratory distress.     Breath sounds: Normal breath sounds. No wheezing or rales.  Musculoskeletal:     Cervical back: Normal range of motion and neck supple.  Skin:    General: Skin is warm and dry.     Capillary Refill: Capillary refill takes less than 2 seconds.     Findings: Rash present. Rash is papular.       Neurological:     General: No focal deficit present.     Mental Status: She is alert and oriented to person, place, and time. Mental status is at baseline.  Psychiatric:        Mood and Affect: Mood normal.        Behavior: Behavior normal.  Thought Content: Thought content normal.        Judgment: Judgment normal.     Results for orders placed or performed during the hospital encounter of 04/11/23  ECHOCARDIOGRAM COMPLETE  Result Value Ref Range   Ao pk vel 1.59 m/s   AV Area VTI 2.91 cm2   AR max vel 2.83 cm2   AV Mean grad 6.0 mmHg   AV Peak grad 10.1 mmHg   Single Plane A2C EF 53.5 %   Single Plane A4C EF 65.4 %   Calc EF 58.6 %   S' Lateral 2.90 cm   AV Area mean vel 2.74 cm2   Area-P 1/2 2.56 cm2   MV VTI 2.80 cm2   Est EF 50 - 55%       Assessment & Plan:   Problem List Items Addressed This Visit       Endocrine   Hypothyroidism - Primary    Abnormal TSH as last Cardiology visit.  Patient on amiodarone which is likely the cause of low TSH.  Will repeat labs at visit today.       Relevant Orders   TSH   T4, free   T3, free   Other Visit Diagnoses     Irritant contact dermatitis, unspecified trigger       Will treat with triamcinalone cream. Follow up if not improved.        Follow up plan: Return if symptoms worsen or fail to improve.

## 2023-04-17 NOTE — Assessment & Plan Note (Signed)
Abnormal TSH as last Cardiology visit.  Patient on amiodarone which is likely the cause of low TSH.  Will repeat labs at visit today.

## 2023-04-18 ENCOUNTER — Telehealth: Payer: Self-pay

## 2023-04-18 LAB — T4, FREE: Free T4: 1.26 ng/dL (ref 0.82–1.77)

## 2023-04-18 LAB — TSH: TSH: 9.58 u[IU]/mL — ABNORMAL HIGH (ref 0.450–4.500)

## 2023-04-18 LAB — T3, FREE: T3, Free: 1.7 pg/mL — ABNORMAL LOW (ref 2.0–4.4)

## 2023-04-18 MED ORDER — LEVOTHYROXINE SODIUM 50 MCG PO TABS: 50.0000 ug | ORAL_TABLET | Freq: Every day | ORAL | 1 refills | Status: DC

## 2023-04-18 NOTE — Progress Notes (Signed)
Please let patient know that her thyroid labs are improved but still elevated.  I have sent in an increased dose of Levothyroxine.  I spoke with Dr. Shirlee Latch and he agrees that we should increase the dose.  I would like her to come back in 6 weeks as a lab visit to repeat her labs.

## 2023-04-18 NOTE — Progress Notes (Signed)
I saw Ms. Eutha in the office yesterday and she asked me to check her labs.  Looks like her TSH has improved but seems like she could use an increased dose.  I am happy to increase it unless you want to manage it.

## 2023-04-18 NOTE — Addendum Note (Signed)
Addended by: [name] on: 04/18/2023 02:47 PM   Modules accepted: Orders

## 2023-04-18 NOTE — Progress Notes (Signed)
   04/18/2023  Patient ID: Kim Buchanan, female   DOB: 01-09-1951, 72 y.o.   MRN: [id number]  Outreach to [facility] to discuss patient's recent FBG values of >200 and possibility of increasing Ozempic to 2mg  daily.  Also checking to see if [name] ever came by for assistance with her Libre 3.  Left a message for Dr. Huntley Dec or CMA to return my call at my direct number.  Lenna Gilford, PharmD, DPLA

## 2023-04-19 ENCOUNTER — Telehealth: Payer: Self-pay

## 2023-04-19 NOTE — Progress Notes (Signed)
   04/19/2023  Patient ID: Kim Buchanan, female   DOB: [birth year], 72 y.o.   MRN: [id number]  Lelon Frohlich from [facility] returned my call yesterday evening, but I was unable to answer.  We were able to connect this morning to discuss Ms. Belgard's diabetes management.  Based on FBG's being consistently >200 per patient, I recommended increasing ozempic from 1mg  to 2mg  weekly.  Tashia (CMA for Dr. Sheral Apley) states that endo does not wish to increase any medications at this time, because lack of adherence to medications is likely aggravating factor.  Andreas Blower also states patient never came back in for assistance with [place]ell sensors, but she can anytime.  Scheduled telephone visit to follow-up with patient next week on medication adherence.    Lenna Gilford, PharmD, DPLA

## 2023-04-22 ENCOUNTER — Other Ambulatory Visit: Payer: Medicare Other

## 2023-04-22 NOTE — Progress Notes (Signed)
   04/22/2023  Patient ID: Kim Buchanan, female   DOB: 09-[name]-1951, 72 y.o.   MRN: [id number]  S/O Telephone follow-up to further address medication adherence  Medication Adherence -Kim Buchanan endorses good adherence to medication regimen, but does state she will occasionally forget nighttime Evaristo Bury  -She states that she is still unable to get her Josephine Igo 3 reader/sensors to work properly.  Endocrinology has assisted with this; but the sensor never connects to the reader after she leaves the office -She was previously on [place] 2 and still has the reader.  Endorses never having problems with Libre 2 device and would like to go back to using it.  A/P  Medication Adherence -Contacting Endocrinology to inform that additional adherence counseling was done and consult on changing patient back to [place] 2 sensors.  Follow-up: None scheduled but can as needed per PCP  Lenna Gilford, PharmD, DPLA

## 2023-04-23 ENCOUNTER — Telehealth: Payer: Self-pay | Admitting: Nurse Practitioner

## 2023-04-23 ENCOUNTER — Telehealth: Payer: Self-pay

## 2023-04-23 DIAGNOSIS — R21 Rash and other nonspecific skin eruption: Secondary | ICD-10-CM

## 2023-04-23 MED ORDER — CLOTRIMAZOLE-BETAMETHASONE 1-0.05 % EX CREA: 1.0000 | TOPICAL_CREAM | Freq: Every day | CUTANEOUS | 0 refills | Status: DC

## 2023-04-23 NOTE — Telephone Encounter (Signed)
I placed the referral.  It is a long wait to get into Dermatology.

## 2023-04-23 NOTE — Telephone Encounter (Signed)
Patient spoke with pharmacist and stated that her rash is still ongoing.  I sent in a different cream call clotrimazole for patient.

## 2023-04-23 NOTE — Addendum Note (Signed)
Addended by: [name] on: 04/23/2023 05:30 PM   Modules accepted: Orders

## 2023-04-23 NOTE — Telephone Encounter (Unsigned)
Copied from CRM [id number]. Topic: Referral - Question >> Apr 23, 2023  3:19 PM Runell Gess P wrote: Reason for CRM: pt called asking if she can be referred to a dermatologist for her chronic rash.  [phone]

## 2023-04-24 NOTE — Telephone Encounter (Signed)
Patient notified of referral being placed.  

## 2023-04-26 NOTE — Progress Notes (Signed)
   04/26/2023  Patient ID: Kim Buchanan, female   DOB: 1950/12/24, 72 y.o.   MRN: [id number]  Left a message for Tashia with [facility] clinic in regard to changing patient's Libre back to [place] based on patient preference.  Lenna Gilford, PharmD, DPLA

## 2023-05-03 NOTE — Telephone Encounter (Signed)
Called pt and spoke to her about her PCP taking over her thyroid managment. Pt had good understanding and no further questions. Forwarded/Sent message and labs to PCP.

## 2023-05-04 DIAGNOSIS — E1165 Type 2 diabetes mellitus with hyperglycemia: Secondary | ICD-10-CM | POA: Diagnosis not present

## 2023-05-21 ENCOUNTER — Telehealth: Payer: Self-pay

## 2023-05-21 NOTE — Progress Notes (Signed)
   05/21/2023  Patient ID: Cindra Presume, female   DOB: [birth year], 72 y.o.   MRN: [id number]  S/O Telephone visit to check on diabetes management  Diabetes Management Plan -Current regimen: glimepiride 4mg  daily with breakfast, Tresiba 100 units nightly, Humalog 9 units TID with meals, metformin 1000mg  BID, Ozempic 1mg  weekly  -Patient is checking BG daily, states FBG 130-140.  She would like to get CGM, and an order was sent to [employer] -Currently has patient assistance for [place]; she previously had it for [employer] and states recently received an order; but this was changed to [place]  A/P  Diabetes Management Plan -Contacting [facility] to see if insurance will cover [place] 2 prescriptions -Coordinating with medication assistance team to make sure Mariella Saa is not sent from [place] PAP moving forward  Follow-up:  8/13  Lenna Gilford, PharmD, DPLA

## 2023-05-22 ENCOUNTER — Other Ambulatory Visit: Payer: Self-pay | Admitting: Nurse Practitioner

## 2023-05-23 ENCOUNTER — Encounter: Payer: Medicare Other | Admitting: Cardiology

## 2023-05-23 NOTE — Telephone Encounter (Signed)
Requested medication (s) are due for refill today: No  Requested medication (s) are on the active medication list: Yes  Last refill:  03/11/23  Future visit scheduled: Yes  Notes to clinic:  Pharmacy requests 1 year supply.    Requested Prescriptions  Pending Prescriptions Disp Refills   metFORMIN (GLUCOPHAGE) 1000 MG tablet [Pharmacy Med Name: metFORMIN HCl 1000 MG Oral Tablet] 200 tablet 2    Sig: TAKE 1 TABLET BY MOUTH TWICE  DAILY WITH MEALS     Endocrinology:  Diabetes - Biguanides Failed - 05/22/2023 10:33 PM      Failed - Cr in normal range and within 360 days    Creat  Date Value Ref Range Status  01/01/2018 0.82 0.50 - 0.99 mg/dL Final    Comment:    For patients >71 years of age, the reference limit for Creatinine is approximately 13% higher for people identified as African-American. .    Creatinine, Ser  Date Value Ref Range Status  03/20/2023 1.07 (H) 0.44 - 1.00 mg/dL Final         Failed - HBA1C is between 0 and 7.9 and within 180 days    HB A1C (BAYER DCA - WAIVED)  Date Value Ref Range Status  01/23/2021 8.6 (H) <7.0 % Final    Comment:                                          Diabetic Adult            <7.0                                       Healthy Adult        4.3 - 5.7                                                           (DCCT/NGSP) American Diabetes Association's Summary of Glycemic Recommendations for Adults with Diabetes: Hemoglobin A1c <7.0%. More stringent glycemic goals (A1c <6.0%) may further reduce complications at the cost of increased risk of hypoglycemia.    Hgb A1c MFr Bld  Date Value Ref Range Status  11/29/2022 9.5 (H) 4.8 - 5.6 % Final    Comment:             Prediabetes: 5.7 - 6.4          Diabetes: >6.4          Glycemic control for adults with diabetes: <7.0    A1c  Date Value Ref Range Status  12/29/2022 7.8%  Final    Comment:    Abstracted by HIM         Failed - eGFR in normal range and within 360 days     GFR, Est African American  Date Value Ref Range Status  01/01/2018 88 > OR = 60 mL/min/1.71m2 Final   GFR calc Af Amer  Date Value Ref Range Status  10/17/2020 79 >59 mL/min/1.73 Final    Comment:    **In accordance with recommendations from the NKF-ASN Task force,**   [facility] is in the process of updating its eGFR  calculation to the   2021 CKD-EPI creatinine equation that estimates kidney function   without a race variable.    GFR, Est Non African American  Date Value Ref Range Status  01/01/2018 76 > OR = 60 mL/min/1.64m2 Final   GFR, Estimated  Date Value Ref Range Status  03/20/2023 56 (L) >60 mL/min Final    Comment:    (NOTE) Calculated using the CKD-EPI Creatinine Equation (2021)    eGFR  Date Value Ref Range Status  01/15/2023 62 >59 mL/min/1.73 Final         Passed - B12 Level in normal range and within 720 days    Vitamin B-12  Date Value Ref Range Status  11/16/2022 494 232 - 1,245 pg/mL Final         Passed - Valid encounter within last 6 months    Recent Outpatient Visits           1 month ago Hypothyroidism, unspecified type   [place] [name], NP   2 months ago Hypertension associated with diabetes (HCC)   [place] [name], NP   3 months ago Encounter for screening mammogram for malignant neoplasm of breast   [facility] [name], NP   4 months ago Right lower quadrant abdominal pain   [facility] [name], NP   4 months ago Right lower quadrant abdominal pain   [facility] [name], NP       Future Appointments             In 2 months [name], NP  [facility]            Passed - CBC within normal limits and completed in the last 12 months    WBC  Date Value Ref Range Status  01/15/2023 7.0 3.4 - 10.8 x10E3/uL Final   11/20/2022 6.7 4.0 - 10.5 K/uL Final   RBC  Date Value Ref Range Status  01/15/2023 4.03 3.77 - 5.28 x10E6/uL Final  11/20/2022 4.27 3.87 - 5.11 MIL/uL Final   Hemoglobin  Date Value Ref Range Status  01/15/2023 11.3 11.1 - 15.9 g/dL Final   Hematocrit  Date Value Ref Range Status  01/15/2023 36.2 34.0 - 46.6 % Final   MCHC  Date Value Ref Range Status  01/15/2023 31.2 (L) 31.5 - 35.7 g/dL Final  52/84/1324 40.1 30.0 - 36.0 g/dL Final   [facility]  Date Value Ref Range Status  01/15/2023 28.0 26.6 - 33.0 pg Final  11/20/2022 29.0 26.0 - 34.0 pg Final   MCV  Date Value Ref Range Status  01/15/2023 90 79 - 97 fL Final  02/24/2013 95 80 - 100 fL Final   No results found for: "PLTCOUNTKUC", "LABPLAT", "POCPLA" RDW  Date Value Ref Range Status  01/15/2023 13.9 11.7 - 15.4 % Final  02/24/2013 13.0 11.5 - 14.5 % Final

## 2023-05-28 ENCOUNTER — Telehealth: Payer: Self-pay

## 2023-05-28 NOTE — Progress Notes (Addendum)
   05/28/2023  Patient ID: Kim Buchanan, female   DOB: [birth year], 72 y.o.   MRN: [id number]  [facility], and they were able to fill patient's Libre 2 Sensors; these are going through for $0 copay.  Pharmacy states [place] 2 reader was already filled and picked up quite some while ago.  Sending patient a message via MyChart to make sure she has the reader and/or phone app and let her know the pharmacy is filling sensors.    Lenna Gilford, PharmD, DPLA

## 2023-05-29 ENCOUNTER — Telehealth: Payer: Self-pay

## 2023-05-29 NOTE — Progress Notes (Signed)
   05/29/2023  Patient ID: Kim Buchanan, female   DOB: 11/10/1950, 72 y.o.   MRN: [id number]  Contacted [employer] to inform them that the patient is no longer taking Basaglar and request that she be taken off of  auto refill.  This has been done, and the medication has been canceled from her profile with the program and pharmacy that fulfilled Basaglar PAP orders.  Lenna Gilford, PharmD, DPLA

## 2023-06-02 ENCOUNTER — Other Ambulatory Visit (HOSPITAL_COMMUNITY): Payer: Self-pay | Admitting: Cardiology

## 2023-06-04 ENCOUNTER — Other Ambulatory Visit: Payer: Medicare Other

## 2023-06-04 DIAGNOSIS — E1165 Type 2 diabetes mellitus with hyperglycemia: Secondary | ICD-10-CM | POA: Diagnosis not present

## 2023-06-04 NOTE — Progress Notes (Unsigned)
06/04/2023 Name: Kim Buchanan MRN: [id number] DOB: 1950/11/15  Chief Complaint  Patient presents with   Diabetes Management Plan   Kim Buchanan is a 72 y.o. year old female who presented for a telephone visit to follow-up on diabetes control Subjective:  Care Team: Primary Care Provider: [name], NP ; Next Scheduled Visit: 10/23 Patient sees endocrinology in September  Medication Access/Adherence Current Pharmacy:  [facility] - [place], [place] - [address] [place] [address] Phone: [phone] Fax: [phone]  [facility] - [place], [place] - [address] [facility] [address] [place] [address] [place] [address] Phone: [phone] Fax: [phone]  [facility] [place], [place] - [address] [facility] [address] [place] [address] Phone: [phone] Fax: [phone]  [facility] - [place], [place] - [address] [place] [address] Phone: [phone] Fax: [phone]  -Patient reports affordability concerns with their medications: No  -Patient reports access/transportation concerns to their pharmacy: No  -Patient reports adherence concerns with their medications:  No    Diabetes: Current medications: glimepiride 4mg  daily with breakfast, Tresiba 100 units at bedtime, Humalog 12 units TID cf, metformin 1000mg  BID, Ozempic 1mg  weekly -Recently increased Humalog to 12 units TID -Current glucose readings: Patient endorses FBGs in upper 70's (no s/sx hypoglycemia), 2hr post-prandial 130's, no readings >200 -She was able to pick up [place] 2 sensors from [employer] and states these are staying on better than [place] 3 and working well for her for CGM -Patient denies hypoglycemic s/sx including dizziness, shakiness, sweating.  -Patient denies hyperglycemic symptoms including polyuria, polydipsia,  polyphagia, nocturia, neuropathy, blurred vision.  Objective: Lab Results  Component Value Date   HGBA1C 9.5 (H) 11/29/2022   Medications Reviewed Today     Reviewed by Lenna Gilford, [facility] (Pharmacist) on 06/04/23 at 1411  Med List Status: <None>   Medication Order Taking? Sig Documenting Provider Last Dose Status Informant  amiodarone (PACERONE) 200 MG tablet [id number]  Take 0.5 tablets (100 mg total) by mouth daily. [place], [place]  Active   aspirin 81 MG tablet [id number]  Take 81 mg by mouth daily.  [provider]  Active Self           Med Note Deretha Emory, [facility]   Fri Jul 19, 2017 12:33 PM)    atorvastatin (LIPITOR) 10 MG tablet [id number]  TAKE 1 TABLET BY MOUTH EVERY  NIGHT AT BEDTIME Laurey Morale, MD  Active   clotrimazole-betamethasone (LOTRISONE) cream [id number]  Apply 1 Application topically daily. [name], NP  Active   Continuous Blood Gluc Receiver (FREESTYLE LIBRE 2 READER) DEVI [id number] Yes 1 each by Does not apply route daily. [name], NP Taking Active   Continuous Glucose Sensor (FREESTYLE LIBRE 2 SENSOR) [place] [id number] Yes 1 each by Does not apply route every 14 (fourteen) days. [name], NP Taking Active   digoxin (LANOXIN) 0.125 MG tablet [id number]  Take 0.5 tablets (0.0625 mg total) by mouth daily. Laurey Morale, MD  Active   gabapentin (NEURONTIN) 300 MG capsule [id number]  Take 1 capsule (300 mg total) by mouth 3 (three) times daily. [name], NP  Active Self  glimepiride (AMARYL) 4 MG tablet [id number] Yes Take 1 tablet (4 mg total) by mouth daily with breakfast. [name], NP Taking Active  Insulin Degludec (TRESIBA FLEXTOUCH [place]) [id number] Yes Inject 100 Units into the skin at bedtime. [provider] Taking Active            Med Note Littie Deeds, Gaspar Skeeters Apr 14, 2023 11:50 AM) PAP  insulin lispro (HUMALOG KWIKPEN) 100 UNIT/ML KwikPen [id number] Yes Inject 5 Units into the skin 3  (three) times daily. [name], NP Taking Active            Med Note Littie Deeds, Lakedra Washington A   Tue [name] 04, 2023  2:10 PM) 12 units TID  Insulin Pen Needle 30G X 6 MM MISC [id number] Yes 1 Application by Does not apply route daily. [name], NP Taking Active            Med Note Littie Deeds, Clea Dubach A   Fri Apr 12, 2023  9:08 AM) 4 times daily   Lancets [facility] [place] PLUS [place]) MISC [id number]  TEST ONCE DAILY [name], NP  Active   levothyroxine (SYNTHROID) 50 MCG tablet [id number]  Take 1 tablet (50 mcg total) by mouth daily before breakfast. [name], NP  Active   metFORMIN (GLUCOPHAGE) 1000 MG tablet [id number] Yes TAKE 1 TABLET BY MOUTH TWICE  DAILY WITH MEALS Mecum, Erin E, PA-C Taking Active   metoprolol succinate (TOPROL-XL) 50 MG 24 hr tablet [id number]  TAKE 1 TABLET BY MOUTH DAILY Laurey Morale, MD  Active   nitroGLYCERIN (NITROSTAT) 0.3 MG SL tablet [id number]  Place 1 tablet (0.3 mg total) under the tongue every 5 (five) minutes as needed for chest pain. Jacklynn Ganong, [place]  Active   [facility] ULTRA test strip [id number]  TEST ONCE DAILY [name], NP  Active   pantoprazole (PROTONIX) 40 MG tablet [id number]  TAKE 1 TABLET BY MOUTH DAILY [name], NP  Active   polyethylene glycol powder (GLYCOLAX/MIRALAX) 17 GM/SCOOP powder [id number]  Take 17 g by mouth daily. [provider]  Active Self  QUEtiapine (SEROQUEL) 200 MG tablet [id number]  TAKE 1 TABLET BY MOUTH DAILY AT  BEDTIME [name], NP  Active   Semaglutide, 1 MG/DOSE, (OZEMPIC, 1 MG/DOSE,) 4 MG/3ML SOPN [id number] Yes Inject 1 mg into the skin once a week. [provider] Taking Active            Med Note Despina Hick Apr 14, 2023 11:50 AM) PAP  spironolactone (ALDACTONE) 25 MG tablet [id number]  TAKE 1 TABLET BY MOUTH DAILY Laurey Morale, MD  Active   torsemide (DEMADEX) 20 MG tablet [id number]  TAKE 2 TABLETS BY MOUTH DAILY  ALTERNATING WITH TAKE  1 TABLET  DAILY Bensimhon, Bevelyn Buckles, MD  Active   triamcinolone cream (KENALOG) 0.1 % [id number]  Apply 1 Application topically 2 (two) times daily. [name], NP  Active   valsartan (DIOVAN) 40 MG tablet [id number]  TAKE 1 AND 1/2 TABLETS BY MOUTH  TWICE DAILY  Patient taking differently: Take 40 mg by mouth 2 (two) times daily.   Laurey Morale, MD  Active            Assessment/Plan:   Diabetes: - Currently uncontrolled - I recommended that patient discuss increasing Ozempic to 2mg  weekly at upcoming endo appointment in September.  Hopefully if this is done, we could start to decrease insulin burden and/or stop glimepiride. - Sending patient a MyChart message with my direct phone number, so she can follow-up with me after seeing endocrinology.  I can assist with order change  form for Ozempic if needed  Follow Up Plan: Will contact patient beginning of October if she does not reach out to me first  Lenna Gilford, PharmD, DPLA

## 2023-06-06 ENCOUNTER — Telehealth: Payer: Self-pay

## 2023-06-06 NOTE — Progress Notes (Signed)
   06/06/2023  Patient ID: Kim Buchanan, female   DOB: 12-17-1950, 72 y.o.   MRN: [id number]  Patient left voicemail stating she received Trulicity through PAP, which she is no longer taking.  She is now taking Ozempic and receives this through [facility] PAP.  Contacted [employer] to request that auto-fill be turned off for [employer]; patient should not receives any further shipments of this medication.  MyChart message sent to make her aware.  Lenna Gilford, PharmD, DPLA

## 2023-06-11 ENCOUNTER — Ambulatory Visit: Payer: Self-pay

## 2023-06-11 ENCOUNTER — Ambulatory Visit: Payer: Medicare Other | Attending: Cardiology

## 2023-06-11 DIAGNOSIS — J984 Other disorders of lung: Secondary | ICD-10-CM | POA: Insufficient documentation

## 2023-06-11 DIAGNOSIS — I5022 Chronic systolic (congestive) heart failure: Secondary | ICD-10-CM | POA: Diagnosis not present

## 2023-06-11 LAB — PULMONARY FUNCTION TEST ARMC ONLY
DL/VA % pred: 80 %
DL/VA: 3.37 ml/min/mmHg/L
DLCO unc % pred: 58 %
DLCO unc: 11.03 ml/min/mmHg
FEF 25-75 Post: 1.31 L/s
FEF 25-75 Pre: 1.18 L/s
FEF2575-%Change-Post: 11 %
FEF2575-%Pred-Post: 71 %
FEF2575-%Pred-Pre: 64 %
FEV1-%Change-Post: 7 %
FEV1-%Pred-Post: 70 %
FEV1-%Pred-Pre: 65 %
FEV1-Post: 1.53 L
FEV1-Pre: 1.42 L
FEV1FVC-%Change-Post: 4 %
FEV1FVC-%Pred-Pre: 93 %
FEV6-%Change-Post: 3 %
FEV6-%Pred-Post: 74 %
FEV6-%Pred-Pre: 72 %
FEV6-Post: 2.05 L
FEV6-Pre: 1.98 L
FEV6FVC-%Change-Post: 0 %
FEV6FVC-%Pred-Post: 104 %
FEV6FVC-%Pred-Pre: 104 %
FVC-%Change-Post: 3 %
FVC-%Pred-Post: 71 %
FVC-%Pred-Pre: 69 %
FVC-Post: 2.05 L
FVC-Pre: 1.99 L
Post FEV1/FVC ratio: 75 %
Post FEV6/FVC ratio: 100 %
Pre FEV1/FVC ratio: 71 %
Pre FEV6/FVC Ratio: 100 %
RV % pred: 59 %
RV: 1.28 L
TLC % pred: 94 %
TLC: 4.62 L

## 2023-06-11 MED ORDER — ALBUTEROL SULFATE (2.5 MG/3ML) 0.083% IN NEBU
2.5000 mg | INHALATION_SOLUTION | Freq: Once | RESPIRATORY_TRACT | Status: AC
  Administered 2023-06-11: 2.5 mg via RESPIRATORY_TRACT
  Filled 2023-06-11: qty 3

## 2023-06-11 NOTE — Telephone Encounter (Signed)
Message from Phill Myron sent at 06/11/2023 10:30 AM EDT  Summary: difficulty putting on libre patch   Difficulty putting on libre patch, please advise        Pt having trouble getting the patch off. [place] knows how to do it. Called office and spoke with Herbert Seta who advised to send not and will call pt back. Pt advised of same and pt verbalized understanding. Reason for Disposition . [1] Caller requesting NON-URGENT health information AND [2] PCP's office is the best resource  Protocols used: Information Only Call - No Triage-A-AH

## 2023-06-11 NOTE — Telephone Encounter (Signed)
Patient has been called and she sated that she will be in this afternoon at 1:40

## 2023-06-11 NOTE — Telephone Encounter (Signed)
Please call and ask the patient to come in to have sensor placed. If she could come around 1:40 this afternoon, tomorrow anytime, or Thursday afternoon before 3:00, that would be best. Appointment is not needed.

## 2023-06-11 NOTE — Telephone Encounter (Signed)
Summary: dificulty putting on libre patch   Difficulty putting on libre patch, please advise          Left message to call back.

## 2023-06-13 ENCOUNTER — Telehealth (HOSPITAL_COMMUNITY): Payer: Self-pay

## 2023-06-13 DIAGNOSIS — R942 Abnormal results of pulmonary function studies: Secondary | ICD-10-CM

## 2023-06-13 NOTE — Telephone Encounter (Signed)
Spoke with patient regarding the following results. Patient made aware and patient verbalized understanding.   Patient is agreeable to pulmonary referral- this has been placed for [place]- patient preference.   Advised patient to call back to office with any issues, questions, or concerns. Patient verbalized understanding.

## 2023-06-13 NOTE — Telephone Encounter (Signed)
-----   Message from Marca Ancona sent at 06/12/2023  5:05 PM EDT ----- Mild airways obstruction suggestive of emphysema.  Reasonable to see a pulmonologist if she is agreeable.

## 2023-06-25 ENCOUNTER — Other Ambulatory Visit (HOSPITAL_COMMUNITY): Payer: Self-pay | Admitting: Cardiology

## 2023-06-25 ENCOUNTER — Telehealth: Payer: Self-pay

## 2023-06-25 NOTE — Progress Notes (Signed)
   06/25/2023  Patient ID: Kim Buchanan, female   DOB: 1951/06/17, 72 y.o.   MRN: [id number]  Received a voicemail from patient stating she needs a refill on her Libre2 sensors.  Contacted [employer], and he prescription had refills remaining that they were able to process.  They do not have the product in stock and are having to order this.  Contacted patient to make her aware; she will call the pharmacy after lunch tomorrow to see when refill can be picked up.  Lenna Gilford, PharmD, DPLA

## 2023-07-01 ENCOUNTER — Ambulatory Visit (HOSPITAL_BASED_OUTPATIENT_CLINIC_OR_DEPARTMENT_OTHER): Payer: Medicare Other | Admitting: Cardiology

## 2023-07-01 ENCOUNTER — Other Ambulatory Visit
Admission: RE | Admit: 2023-07-01 | Discharge: 2023-07-01 | Disposition: A | Discharge status: Home / Self Care | Payer: Medicare Other | Source: Ambulatory Visit | Attending: Cardiology | Admitting: Cardiology

## 2023-07-01 ENCOUNTER — Encounter: Payer: Self-pay | Admitting: Cardiology

## 2023-07-01 VITALS — BP 114/48 | HR 81 | Wt 226.0 lb

## 2023-07-01 DIAGNOSIS — I5022 Chronic systolic (congestive) heart failure: Secondary | ICD-10-CM | POA: Diagnosis not present

## 2023-07-01 LAB — LIPID PANEL
Cholesterol: 78 mg/dL (ref 0–200)
HDL: 29 mg/dL — ABNORMAL LOW (ref >40)
LDL Cholesterol: 26 mg/dL (ref 0–99)
Total CHOL/HDL Ratio: 2.7 ratio
Triglycerides: 116 mg/dL (ref <150)
VLDL: 23 mg/dL (ref 0–40)

## 2023-07-01 LAB — COMPREHENSIVE METABOLIC PANEL
ALT: 68 U/L — ABNORMAL HIGH (ref 0–44)
AST: 132 U/L — ABNORMAL HIGH (ref 15–41)
Albumin: 3.7 g/dL (ref 3.5–5.0)
Alkaline Phosphatase: 151 U/L — ABNORMAL HIGH (ref 38–126)
Anion gap: 13 (ref 5–15)
BUN: 20 mg/dL (ref 8–23)
CO2: 23 mmol/L (ref 22–32)
Calcium: 8.7 mg/dL — ABNORMAL LOW (ref 8.9–10.3)
Chloride: 104 mmol/L (ref 98–111)
Creatinine, Ser: 0.84 mg/dL (ref 0.44–1.00)
GFR, Estimated: >60 mL/min (ref >60)
Glucose, Bld: 149 mg/dL — ABNORMAL HIGH (ref 70–99)
Potassium: 4.2 mmol/L (ref 3.5–5.1)
Sodium: 140 mmol/L (ref 135–145)
Total Bilirubin: 1 mg/dL (ref 0.3–1.2)
Total Protein: 8 g/dL (ref 6.5–8.1)

## 2023-07-01 NOTE — Patient Instructions (Signed)
DISCONTINUE DIGOXIN  GO TO MEDICAL MALL AND HAVE YOUR BLOOD WORK COMPLETED  FOLLOW UP IN 3 MONTHS (THE SCHEDULE ISN'T OPEN YET. WE WILL CALL YOU CLOSER TO THAT TIME TO GET YOU SCHEDULED)

## 2023-07-01 NOTE — Progress Notes (Signed)
PCP: [name], NP Cardiology: Dr. Mariah Milling HF Cardiology: Dr. Shirlee Latch  72 y.o. with history of chronic systolic CHF/nonischemic cardiomyopathy, type 2 diabetes, and prior smoking was referred by Dr. Lalla Brothers for evaluation of CHF.  She has been noted to have a cardiomyopathy since 2020.  Echo in 11/20 showed EF 25-30%.  LHC in 11/20 and coronary CTA in 9/21 have shown nonobstructive CAD.  She has been evaluated by pulmonary, mild restrictive lung disease and mild COPD.  She quit smoking in 2020, she does not drink ETOH.   Echo in 3/22 showed EF 30-35%, with normal RV.   RHC in 5/22 was worrisome, showing right > left heart failure and low cardiac output with low PAPI.   Zio patch in 8/22 showed 21% PVCs.  Amiodarone was started.  Cardiac MRI in 9/22 showed EF 40%, septal-lateral dyssynchrony, RV EF 45%, LGE in the mid-wall apical lateral and apical septal walls. Repeat Zio patch in 10/22 showed 5.8% PVCs, improved.    Patient stopped [place] after 3 yeast infections.   Echo in 5/23 showed EF 35-40%, mildly decreased RV systolic function, trivial MR.  Echo in 6/24 showed EF up to 50-55%, normal RV, mild MR, IVC normal. PFTs showed mild obstruction from emphysema.   She returns for followup of CHF.  She is short of breath with moderate exertion such as carrying groceries.  No dyspnea walking on flat ground but has a hard time with inclines.  No chest pain.  No lightheadedness.  Has been on semaglutide for weight loss.    Labs (6/22): K 4.4, creatinine 0.79 Labs (2/23): K 4.8, creatinine 0.95, LDL 18, TSH 5.37, ALT 102, AST 95, hgb 15.2 Labs (8/23): LDL 74, K 4.3, creatinine 8.11, AST 33, ALT 46 Labs (2/24): LDL 19, TGs 173 Labs (3/24): K 4.9, creatinine 0.99, AST 127, ALT 132 Labs (5/24): K 4.7, creatinine 1.07, AST 147, ALT 73 Labs (6/24): TSH 9.58  PMH: 1. Chronic systolic CHF: Nonischemic cardiomyopathy.   - Echo (6/20): Normal EF - Cardiolite (6/20): Anterior ischemia.  - Echo  (11/20): EF 25-30% - LHC (11/20): 70% pRCA stenosis, FFR negative.  - Echo (8/21): EF 30-35% - Coronary CTA (9/21): Mild RCA disease, 40-59% mLAD stenosis but CT FFR negative.  - Echo (3/22): EF 30-35% with normal RV - RHC (5/22): mean RA 17, mean PA 28, mean PCWP 20, CI 1.3, PAPI < 1.  - Cardiac MRI (9/22): EF 40%, septal-lateral dyssynchrony, RV EF 45%, LGE in the mid-wall apical lateral and apical septal walls. - Echo (5/23): EF 35-40%, mildly decreased RV systolic function, trivial MR.   - Echo (6/24): EF up to 50-55%, normal RV, mild MR, IVC normal 2. COPD: Mild.  Quit smoking in 2020.  - PFTs with mild obstruction consistent with emphysema.  3. Elevated LFTs: ?NAFLD - Abdominal US (2/24): Fatty liver.  4. Type 2 diabetes 5. Coronary artery disease: Nonobstructive.  6. Hyperlipidemia 7. OSA 8. GERD 9. Depression 10. Elevated LFTs: Long-standing.  HCV negative.  11. PVCs: Zio patch in 8/22 showed 21% PVCs.  Amiodarone was started. Repeat Zio patch in 10/22 showed 5.8% PVCs, improved.   12. Sleep study (8/22): No OSA.   FH: Brother with "heart surgery," ?CHF.  SH: Married, no ETOH, quit smoking in 2020, retired, lives in [place]  ROS: All systems reviewed and negative except as per HPI.   Current Outpatient Medications  Medication Sig Dispense Refill   amiodarone (PACERONE) 200 MG tablet Take 0.5 tablets (100 mg  total) by mouth daily. 45 tablet 3   aspirin 81 MG tablet Take 81 mg by mouth daily.      atorvastatin (LIPITOR) 10 MG tablet TAKE 1 TABLET BY MOUTH EVERY  NIGHT AT BEDTIME 90 tablet 3   clotrimazole-betamethasone (LOTRISONE) cream Apply 1 Application topically daily. 30 g 0   Continuous Blood Gluc Receiver (FREESTYLE LIBRE 2 READER) [place] 1 each by Does not apply route daily. 1 each 0   Continuous Glucose Sensor (FREESTYLE LIBRE 2 SENSOR) MISC 1 each by Does not apply route every 14 (fourteen) days. 12 each 1   gabapentin (NEURONTIN) 300 MG capsule Take 1 capsule (300  mg total) by mouth 3 (three) times daily. 90 capsule 0   glimepiride (AMARYL) 4 MG tablet Take 1 tablet (4 mg total) by mouth daily with breakfast. 90 tablet 1   Insulin Degludec (TRESIBA FLEXTOUCH [place]) Inject 100 Units into the skin at bedtime.     insulin lispro (HUMALOG KWIKPEN) 100 UNIT/ML KwikPen Inject 5 Units into the skin 3 (three) times daily. 15 mL 11   Insulin Pen Needle 30G X 6 MM MISC 1 Application by Does not apply route daily. 100 each 2   Lancets (ONETOUCH DELICA PLUS LANCET30G) MISC TEST ONCE DAILY 100 each 3   levothyroxine (SYNTHROID) 50 MCG tablet Take 1 tablet (50 mcg total) by mouth daily before breakfast. 90 tablet 1   metFORMIN (GLUCOPHAGE) 1000 MG tablet TAKE 1 TABLET BY MOUTH TWICE  DAILY WITH MEALS 200 tablet 2   metoprolol succinate (TOPROL-XL) 50 MG 24 hr tablet TAKE 1 TABLET BY MOUTH DAILY 90 tablet 3   nitroGLYCERIN (NITROSTAT) 0.3 MG SL tablet Place 1 tablet (0.3 mg total) under the tongue every 5 (five) minutes as needed for chest pain. 100 tablet 3   ONETOUCH ULTRA test strip TEST ONCE DAILY 100 strip 3   pantoprazole (PROTONIX) 40 MG tablet TAKE 1 TABLET BY MOUTH DAILY 100 tablet 2   polyethylene glycol powder (GLYCOLAX/MIRALAX) 17 GM/SCOOP powder Take 17 g by mouth daily.     QUEtiapine (SEROQUEL) 200 MG tablet TAKE 1 TABLET BY MOUTH DAILY AT  BEDTIME 90 tablet 3   Semaglutide, 1 MG/DOSE, (OZEMPIC, 1 MG/DOSE,) 4 MG/3ML SOPN Inject 1 mg into the skin once a week.     spironolactone (ALDACTONE) 25 MG tablet TAKE 1 TABLET BY MOUTH DAILY 90 tablet 2   torsemide (DEMADEX) 20 MG tablet TAKE 2 TABLETS BY MOUTH DAILY  ALTERNATING WITH TAKE 1 TABLET  DAILY 150 tablet 2   triamcinolone cream (KENALOG) 0.1 % Apply 1 Application topically 2 (two) times daily. 30 g 1   valsartan (DIOVAN) 40 MG tablet Take 40 mg by mouth 2 (two) times daily.     No current facility-administered medications for this visit.   BP (!) 114/48   Pulse 81   Wt 226 lb (102.5 kg)   SpO2 94%    BMI 41.34 kg/m  General: NAD Neck: No JVD, no thyromegaly or thyroid nodule.  Lungs: Mildly distant BS.  CV: Nondisplaced PMI.  Heart regular S1/S2, no S3/S4, no murmur.  No peripheral edema.  No carotid bruit.  Normal pedal pulses.  Abdomen: Soft, nontender, no hepatosplenomegaly, no distention.  Skin: Intact without lesions or rashes.  Neurologic: Alert and oriented x 3.  Psych: Normal affect. Extremities: No clubbing or cyanosis.  HEENT: Normal.   Assessment/Plan: 1. Chronic systolic CHF: Nonischemic cardiomyopathy.  Echo in 3/22 with EF 30-35%, normal RV.  LHC in  11/20 and coronary CTA in 9/21 showed nonobstructive CAD.  RHC in 5/22 was worrisome with R>L heart failure and low cardiac output as well as low PAPI.  Cardiac MRI in 9/22 showed EF 40%, septal-lateral dyssynchrony, RV EF 45%, LGE in the mid-wall apical lateral and apical septal walls.  This was concerning for possible myocarditis (less likely amyloidosis or sarcoidosis).  Echo in 5/23 with EF 35-40%, mildly decreased RV systolic function, trivial MR.  Also concern that PVCs may play a role as Zio monitor in 8/22 showed 21% PVCs, improved to 5.8% with amiodarone.  Echo in 6/24 showed EF up to 50-55%, normal RV, mild MR, IVC normal.  Improvement in EF may be related to suppression of PVCs.  NYHA class II-III, not volume overloaded on exam.  I think some of her dyspnea is likely due to COPD.  - She can stop digoxin with improvement in EF.  - Continue torsemide 40 mg daily alternating with 20 mg daily with BMET today.  - Continue valsartan 40 mg bid (had to be decreased due to lightheadedness).  She could not tolerate Entresto due to cough.  - Continue spironolactone 25 mg daily.   - Off dapagliflozin with multiple yeast infections.  - Continue Toprol XL to 50 mg daily.  2. CAD: nonobstructive by LHC in 11/20 and coronary CTA in 9/21.  - Continue atorvastatin. Check lipids today.  - Continue ASA 81 daily.  3. PVCs: 21% PVCs on 8/22  Zio monitor.  ?Playing role in cardiomyopathy. PVCs down to 5.8% on Zio in 10/22 on amiodarone.  This may have led to improvement in EF by echo in 6/24.  - Continue amiodarone 100 mg daily.  On Levoxyl for hypothyroidism managed by PCP. Marland Kitchen  Also has chronic mild elevation in LFTs (see below), check today.  4. Elevated LFTs: This appears to be chronic and mild.  LFT elevation pre-dates amiodarone use, and amiodarone does not seem to have worsened LFTs.  Suspect NAFLD.  She had workup for viral hepatitis (negative) in the past.   - Follows with GI.  - Recheck LFTs today.   5. COPD: Prior smoker.  I suspect COPD plays a role in her dyspnea. She still has significant exertional shortness of breath but is not volume overloaded.  PFTs with mild obstruction suggesting a degree of emphysema.  - She has appointment with pulmonary scheduled.   Followup with me in 3 months.    Marca Ancona 07/01/2023

## 2023-07-11 ENCOUNTER — Institutional Professional Consult (permissible substitution): Payer: Medicare Other | Admitting: Pulmonary Disease

## 2023-07-11 DIAGNOSIS — E1169 Type 2 diabetes mellitus with other specified complication: Secondary | ICD-10-CM | POA: Diagnosis not present

## 2023-07-11 DIAGNOSIS — E1159 Type 2 diabetes mellitus with other circulatory complications: Secondary | ICD-10-CM | POA: Diagnosis not present

## 2023-07-11 DIAGNOSIS — E785 Hyperlipidemia, unspecified: Secondary | ICD-10-CM | POA: Diagnosis not present

## 2023-07-11 DIAGNOSIS — E1165 Type 2 diabetes mellitus with hyperglycemia: Secondary | ICD-10-CM | POA: Diagnosis not present

## 2023-07-11 DIAGNOSIS — I152 Hypertension secondary to endocrine disorders: Secondary | ICD-10-CM | POA: Diagnosis not present

## 2023-07-11 DIAGNOSIS — Z794 Long term (current) use of insulin: Secondary | ICD-10-CM | POA: Diagnosis not present

## 2023-07-11 LAB — HEMOGLOBIN A1C: Hemoglobin A1C: 8

## 2023-07-17 ENCOUNTER — Ambulatory Visit: Payer: Medicare Other | Admitting: Pulmonary Disease

## 2023-07-17 ENCOUNTER — Telehealth: Payer: Self-pay | Admitting: Pulmonary Disease

## 2023-07-17 ENCOUNTER — Encounter: Payer: Self-pay | Admitting: Pulmonary Disease

## 2023-07-17 VITALS — BP 102/60 | HR 78 | Temp 97.6°F | Ht 62.0 in | Wt 227.0 lb

## 2023-07-17 DIAGNOSIS — R0602 Shortness of breath: Secondary | ICD-10-CM | POA: Diagnosis not present

## 2023-07-17 MED ORDER — INCRUSE ELLIPTA 62.5 MCG/ACT IN AEPB
1.0000 | INHALATION_SPRAY | Freq: Every day | RESPIRATORY_TRACT | 6 refills | Status: DC
Start: 2023-07-17 — End: 2024-02-12

## 2023-07-17 NOTE — Telephone Encounter (Signed)
Pharmacy team please check with her insurance and see what a cheaper alternative would be. Thank you!

## 2023-07-17 NOTE — Telephone Encounter (Signed)
PT states new meds were too expensive for her (47.00) Dr. Mervyn Skeeters said to call if this was the case.  Please try to find an alternative for her. Her # is [phone]   (INCRUSE ELLIPTA) 62.5 MCG/ACT AEPB [[id number]]

## 2023-07-17 NOTE — Progress Notes (Signed)
Synopsis: Referred in by Laurey Morale, MD   Subjective:   PATIENT ID: Kim Buchanan GENDER: female DOB: Feb 12, 1951, MRN: [id number]  Chief Complaint  Patient presents with   Consult    Shortness of breath with exertion. No cough or wheezing.    HPI Kim Buchanan is a pleasant 72 year old female patient with a past medical history of nonischemic cardiomyopathy and grade 1 diastolic dysfunction LVEF 50 to 55% on 03/2023, type 2 diabetes mellitus, morbid obesity presenting to the pulmonary clinic for shortness of breath and concern for COPD.  She reports dyspnea on exertion that has become worse for the past couple of years.  She denies any cough wheezing or sputum production.  She was never hospitalized for COPD or COPD exacerbation.  Currently not on any inhalers but reports that she was trialed on many without any benefits.  She sees Dr. Caleb Popp for her nonischemic cardiomyopathy as well as high PVC burden.  She was diagnosed with nonischemic cardiomyopathy on echocardiogram in 2020 with left heart cath showing nonobstructive coronary artery disease.  Echocardiogram 3/22 showed an EF of 30 to 35% with normal RV she subsequently underwent a right heart cath which showed borderline mean PA pressures with elevated right-sided pressure.  Zio patch in 2022 showed 21% PVCs for which amiodarone was started.  She also underwent a cardiac MRI that did not show any infiltrative disease.  She underwent pulmonary function test on 08/24 with spirometry showing ventilatory defect without obstruction normal lung volumes and moderate reduction in DLCO.  However FEV1 is significantly reduced more than FVC which could indicate an obstructive defect consistent with COPD.   Family history -she denies any family history of pulmonary disease  Social history -she quit smoking 4 years ago smoked half a pack per day since age 74.  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  ROS All systems were reviewed and are negative except for the above.  Objective:   Vitals:   07/17/23 1017  BP: 102/60  Pulse: 78  Temp: 97.6 F (36.4 C)  TempSrc: Temporal  SpO2: 95%  Weight: 227 lb (103 kg)  Height: 5\' 2"  (1.575 m)   95% on RA BMI Readings from Last 3 Encounters:  07/17/23 41.52 kg/m  07/01/23 41.34 kg/m  04/17/23 40.38 kg/m   Wt Readings from Last 3 Encounters:  07/17/23 227 lb (103 kg)  07/01/23 226 lb (102.5 kg)  04/17/23 220 lb 12.8 oz (100.2 kg)    Physical Exam GEN: NAD, Healthy Appearing HEENT: Supple Neck, Reactive Pupils, EOMI  CVS: Normal S1, Normal S2, RRR, No murmurs or ES appreciated  Lungs: Bibasilar crackles noted  Abdomen: Soft, non tender, non distended, + BS  Extremities: Warm and well perfused, No edema  Skin: No suspicious lesions appreciated  Psych: Normal Affect  Ancillary Information   CBC    Component Value Date/Time   WBC 7.0 01/15/2023 1545   WBC 6.7 11/20/2022 0942   RBC 4.03 01/15/2023 1545   RBC 4.27 11/20/2022 0942   HGB 11.3 01/15/2023 1545   HCT 36.2 01/15/2023 1545   PLT 275 01/15/2023 1545   MCV 90 01/15/2023 1545   MCV 95 02/24/2013 1107   MCH 28.0 01/15/2023 1545   MCH 29.0 11/20/2022 0942   MCHC 31.2 (L) 01/15/2023 1545   MCHC 32.0 11/20/2022 0942   RDW 13.9 01/15/2023 1545   RDW 13.0 02/24/2013 1107   LYMPHSABS 1.4 01/15/2023 1545  LYMPHSABS 3.1 02/24/2013 1107   MONOABS 0.5 11/20/2022 0942   MONOABS 0.6 02/24/2013 1107   EOSABS 0.1 01/15/2023 1545   EOSABS 0.3 02/24/2013 1107   BASOSABS 0.0 01/15/2023 1545   BASOSABS 0.0 02/24/2013 1107    Imaging   CT Chest 10/2022:  Central airways are patent. Bibasilar atelectasis. No consolidation, pleural effusion or pneumothorax. Stable solid and ground-glass pulmonary nodules. Reference ground-glass nodule of the left upper lobe measuring 7.1 mm on image 82  Echocardiogram 03/2023:   1. Left ventricular ejection fraction, by  estimation, is 50 to 55%. The  left ventricle has low normal function. The left ventricle has no regional  wall motion abnormalities. Left ventricular diastolic parameters are  consistent with Grade I diastolic  dysfunction (impaired relaxation). The average left ventricular global  longitudinal strain is -14.1 %.   2. Right ventricular systolic function is normal. The right ventricular  size is normal.   3. The mitral valve is normal in structure. Mild mitral valve  regurgitation. No evidence of mitral stenosis.   4. The aortic valve is normal in structure. Aortic valve regurgitation is  not visualized. No aortic stenosis is present.   5. The inferior vena cava is normal in size with greater than 50%  respiratory variability, suggesting right atrial pressure of 3 mmHg.   PFTs 05/2023    Latest Ref Rng & Units 06/11/2023    3:31 PM 05/30/2020   12:38 PM  PFT Results  FVC-Pre L 1.99    FVC-Predicted Pre % 69  75  P  FVC-Post L 2.05  2.45  P  FVC-Predicted Post % 71  82  P  Pre FEV1/FVC % % 71  77  P  Post FEV1/FCV % % 75  77  P  FEV1-Pre L 1.42  1.72  P  FEV1-Predicted Pre % 65  76  P  FEV1-Post L 1.53  1.89  P  DLCO uncorrected ml/min/mmHg 11.03  17.85  P  DLCO UNC% % 58  93  P  DLVA Predicted % 80  85  P  TLC L 4.62  4.55  P  TLC % Predicted % 94  92  P  RV % Predicted % 59  97  P    P Preliminary result     Assessment & Plan:  Kim Buchanan is a pleasant 72 year old female patient with a past medical history of nonischemic cardiomyopathy and grade 1 diastolic dysfunction LVEF 50 to 55% on 03/2023, type 2 diabetes mellitus, morbid obesity presenting to the pulmonary clinic for shortness of breath and concern for COPD.  Her shortness of breath is likely multifactorial in the setting of nonischemic cardiomyopathy, possible obstructive lung disease and obesity.  Her PFTs did shows signs of obstructive lung disease such as severely reduced FEV1 however ratio was normal.  Some air  trapping.  She is a smoker and CT chest shows mild emphysema.  She does have nonischemic cardiomyopathy with recovered EF appears euvolemic on exam.  Plan is to trial Incruse Ellipta LAMA for 3 months and continue trending weight loss currently on Ozempic.  Should she remain symptomatic despite disease interventions might consider repeating a right heart cath and eventually a CPET.  Return in about 3 months (around 10/16/2023).  I spent 60 minutes caring for this patient today, including preparing to see the patient, obtaining a medical history , reviewing a separately obtained history, performing a medically appropriate examination and/or evaluation, counseling and educating the patient/family/caregiver, ordering medications, tests, or  procedures, documenting clinical information in the electronic health record, and independently interpreting results (not separately reported/billed) and communicating results to the patient/family/caregiver  Janann Colonel, MD [facility] 07/17/2023 12:14 PM

## 2023-07-18 ENCOUNTER — Other Ambulatory Visit (HOSPITAL_COMMUNITY): Payer: Self-pay

## 2023-07-18 NOTE — Telephone Encounter (Signed)
Spoke to patient and relayed below message. She stated that she would hold off on inhaler at this time, as she felt that it wasn't going to help anyway. Routing to Dr. Larinda Buttery as an Lorain Childes.  Nothing further needed.

## 2023-07-18 NOTE — Telephone Encounter (Signed)
No alternatives are cheaper than the Incruse which is showing at a $47.00 co-pay. All alternatives in same class are the same price.

## 2023-07-19 MED ORDER — TRELEGY ELLIPTA 100-62.5-25 MCG/ACT IN AEPB: 1.0000 | INHALATION_SPRAY | Freq: Every day | RESPIRATORY_TRACT | Status: DC

## 2023-07-19 NOTE — Telephone Encounter (Signed)
Two samples of trelegy 100 have been placed up front for pickup.  Nothing further needed.

## 2023-07-23 ENCOUNTER — Institutional Professional Consult (permissible substitution): Payer: Medicare Other | Admitting: Pulmonary Disease

## 2023-07-24 ENCOUNTER — Telehealth: Payer: Self-pay

## 2023-07-24 NOTE — Progress Notes (Signed)
   07/24/2023  Patient ID: Kim Buchanan, female   DOB: [birth year], 72 y.o.   MRN: [id number]  Patient call/voicemail stating she is out of Ozempic.  Contacted [facility], and next Ozempic 1mg  refill is not due until 11/11.  Upon further chart review, 4 boxes of Ozempic arrived at [facility] on 8/23.  Contacted patient to inform these should be available at [facility] if not already picked up.  She will contact today.  Lenna Gilford, PharmD, DPLA

## 2023-08-08 ENCOUNTER — Other Ambulatory Visit: Payer: Self-pay | Admitting: Nurse Practitioner

## 2023-08-09 NOTE — Telephone Encounter (Signed)
Requested medications are due for refill today.  yes  Requested medications are on the active medications list.  yes  Last refill. 08/27/2022 #90 3 rf  Future visit scheduled.   yes  Notes to clinic.  Refill not delegated.    Requested Prescriptions  Pending Prescriptions Disp Refills   QUEtiapine (SEROQUEL) 200 MG tablet [Pharmacy Med Name: QUETIAPINE  200MG   TAB] 90 tablet 3    Sig: TAKE 1 TABLET BY MOUTH DAILY AT  BEDTIME     Not Delegated - Psychiatry:  Antipsychotics - Second Generation (Atypical) - quetiapine Failed - 08/08/2023 10:43 PM      Failed - This refill cannot be delegated      Failed - TSH in normal range and within 360 days    TSH  Date Value Ref Range Status  04/17/2023 9.580 (H) 0.450 - 4.500 uIU/mL Final         Failed - Lipid Panel in normal range within the last 12 months    Cholesterol, Total  Date Value Ref Range Status  11/29/2022 80 (L) 100 - 199 mg/dL Final   Cholesterol  Date Value Ref Range Status  07/01/2023 78 0 - 200 mg/dL Final   LDL Cholesterol (Calc)  Date Value Ref Range Status  01/01/2018 29 mg/dL (calc) Final    Comment:    Reference range: <100 . Desirable range <100 mg/dL for primary prevention;   <70 mg/dL for patients with CHD or diabetic patients  with > or = 2 CHD risk factors. Marland Kitchen LDL-C is now calculated using the Martin-Hopkins  calculation, which is a validated novel method providing  better accuracy than the Friedewald equation in the  estimation of LDL-C.  Horald Pollen et al. Lenox Ahr. 1610;960(45): 2061-2068  (http://education.[facility].com/faq/FAQ164)    LDL Chol Calc (NIH)  Date Value Ref Range Status  11/29/2022 19 0 - 99 mg/dL Final   LDL Cholesterol  Date Value Ref Range Status  07/01/2023 26 0 - 99 mg/dL Final    Comment:           Total Cholesterol/HDL:CHD Risk Coronary Heart Disease Risk Table                     Men   Women  1/2 Average Risk   3.4   3.3  Average Risk       5.0   4.4  2 X  Average Risk   9.6   7.1  3 X Average Risk  23.4   11.0        Use the calculated Patient Ratio above and the CHD Risk Table to determine the patient's CHD Risk.        ATP III CLASSIFICATION (LDL):  <100     mg/dL   Optimal  409-811  mg/dL   Near or Above                    Optimal  130-159  mg/dL   Borderline  914-782  mg/dL   High  >956     mg/dL   Very High Performed at [facility], [address] [place], [place] [address]    HDL  Date Value Ref Range Status  07/01/2023 29 (L) >40 mg/dL Final  65/78/4696 33 (L) >39 mg/dL Final   Triglycerides  Date Value Ref Range Status  07/01/2023 116 <150 mg/dL Final         Passed - Completed PHQ-2 or PHQ-9 in the last 360  days      Passed - Last BP in normal range    BP Readings from Last 1 Encounters:  07/17/23 102/60         Passed - Last Heart Rate in normal range    Pulse Readings from Last 1 Encounters:  07/17/23 78         Passed - Valid encounter within last 6 months    Recent Outpatient Visits           3 months ago Hypothyroidism, unspecified type   [place] [facility] [name], NP   5 months ago Hypertension associated with diabetes [facility])   [place] [facility] [name], NP   6 months ago Encounter for screening mammogram for malignant neoplasm of breast   [place] [facility] [name], NP   6 months ago Right lower quadrant abdominal pain   [place] [facility] [name], NP   7 months ago Right lower quadrant abdominal pain   [place] [name] [name], NP       Future Appointments             In 5 days [name], NP [place] [facility]            Passed - CBC within normal limits and completed in the last 12 months    WBC  Date Value Ref Range Status  01/15/2023 7.0 3.4 - 10.8 x10E3/uL Final  11/20/2022 6.7 4.0 - 10.5 K/uL  Final   RBC  Date Value Ref Range Status  01/15/2023 4.03 3.77 - 5.28 x10E6/uL Final  11/20/2022 4.27 3.87 - 5.11 MIL/uL Final   Hemoglobin  Date Value Ref Range Status  01/15/2023 11.3 11.1 - 15.9 g/dL Final   Hematocrit  Date Value Ref Range Status  01/15/2023 36.2 34.0 - 46.6 % Final   MCHC  Date Value Ref Range Status  01/15/2023 31.2 (L) 31.5 - 35.7 g/dL Final  53/66/4403 47.4 30.0 - 36.0 g/dL Final   [facility]  Date Value Ref Range Status  01/15/2023 28.0 26.6 - 33.0 pg Final  11/20/2022 29.0 26.0 - 34.0 pg Final   MCV  Date Value Ref Range Status  01/15/2023 90 79 - 97 fL Final  02/24/2013 95 80 - 100 fL Final   No results found for: "PLTCOUNTKUC", "LABPLAT", "POCPLA" RDW  Date Value Ref Range Status  01/15/2023 13.9 11.7 - 15.4 % Final  02/24/2013 13.0 11.5 - 14.5 % Final         Passed - CMP within normal limits and completed in the last 12 months    Albumin  Date Value Ref Range Status  07/01/2023 3.7 3.5 - 5.0 g/dL Final  25/95/6387 4.0 3.9 - 4.9 g/dL Final  56/43/3295 3.6 3.4 - 5.0 g/dL Final   Alkaline Phosphatase  Date Value Ref Range Status  07/01/2023 151 (H) 38 - 126 U/L Final  02/24/2013 113 50 - 136 Unit/L Final   Alkaline phosphatase (APISO)  Date Value Ref Range Status  01/01/2018 92 33 - 130 U/L Final   ALT  Date Value Ref Range Status  07/01/2023 68 (H) 0 - 44 U/L Final   SGPT (ALT)  Date Value Ref Range Status  02/24/2013 94 (H) 12 - 78 U/L Final   AST  Date Value Ref Range Status  07/01/2023 132 (H) 15 - 41 U/L Final   SGOT(AST)  Date Value Ref Range Status  02/24/2013 58 (H) 15 - 37 Unit/L Final   BUN  Date Value Ref Range Status  07/01/2023 20 8 - 23 mg/dL Final  16/07/9603 23 8 - 27 mg/dL Final  54/06/8118 11 7 - 18 mg/dL Final   Calcium  Date Value Ref Range Status  07/01/2023 8.7 (L) 8.9 - 10.3 mg/dL Final   Calcium, Total  Date Value Ref Range Status  02/24/2013 8.6 8.5 - 10.1 mg/dL Final   CO2  Date Value  Ref Range Status  07/01/2023 23 22 - 32 mmol/L Final   Co2  Date Value Ref Range Status  02/24/2013 26 21 - 32 mmol/L Final   Bicarbonate  Date Value Ref Range Status  01/06/2018 24.3 20.0 - 28.0 mmol/L Final   Creat  Date Value Ref Range Status  01/01/2018 0.82 0.50 - 0.99 mg/dL Final    Comment:    For patients >35 years of age, the reference limit for Creatinine is approximately 13% higher for people identified as African-American. .    Creatinine, Ser  Date Value Ref Range Status  07/01/2023 0.84 0.44 - 1.00 mg/dL Final   Glucose  Date Value Ref Range Status  02/24/2013 87 65 - 99 mg/dL Final   Glucose, Bld  Date Value Ref Range Status  07/01/2023 149 (H) 70 - 99 mg/dL Final    Comment:    Glucose reference range applies only to samples taken after fasting for at least 8 hours.   Glucose-Capillary  Date Value Ref Range Status  11/20/2022 242 (H) 70 - 99 mg/dL Final    Comment:    Glucose reference range applies only to samples taken after fasting for at least 8 hours.   Potassium  Date Value Ref Range Status  07/01/2023 4.2 3.5 - 5.1 mmol/L Final  02/24/2013 4.1 3.5 - 5.1 mmol/L Final   Sodium  Date Value Ref Range Status  07/01/2023 140 135 - 145 mmol/L Final  01/15/2023 137 134 - 144 mmol/L Final  02/24/2013 143 136 - 145 mmol/L Final   Total Bilirubin  Date Value Ref Range Status  07/01/2023 1.0 0.3 - 1.2 mg/dL Final   Bilirubin,Total  Date Value Ref Range Status  02/24/2013 0.4 0.2 - 1.0 mg/dL Final   Bilirubin Total  Date Value Ref Range Status  01/15/2023 0.6 0.0 - 1.2 mg/dL Final   Bilirubin, Direct  Date Value Ref Range Status  01/30/2022 0.4 (H) 0.0 - 0.2 mg/dL Final   Indirect Bilirubin  Date Value Ref Range Status  01/30/2022 0.6 0.3 - 0.9 mg/dL Final    Comment:    Performed at [facility], [address] [place], [place] [address]   Protein, ur  Date Value Ref Range Status  01/04/2018 NEGATIVE NEGATIVE mg/dL  Final   Protein,UA  Date Value Ref Range Status  01/10/2023 Negative Negative/Trace Final   Total Protein  Date Value Ref Range Status  07/01/2023 8.0 6.5 - 8.1 g/dL Final  95/62/1308 6.9 6.0 - 8.5 g/dL Final  65/78/4696 7.2 6.4 - 8.2 g/dL Final   GFR, Est African American  Date Value Ref Range Status  01/01/2018 88 > OR = 60 mL/min/1.24m2 Final   GFR calc Af Amer  Date Value Ref Range Status  10/17/2020 79 >59 mL/min/1.73 Final    Comment:    **In accordance with recommendations from the NKF-ASN Task force,**   [facility] is in the process of updating its eGFR calculation to the   2021 CKD-EPI creatinine equation that estimates  kidney function   without a race variable.    eGFR  Date Value Ref Range Status  01/15/2023 62 >59 mL/min/1.73 Final   GFR, Est Non African American  Date Value Ref Range Status  01/01/2018 76 > OR = 60 mL/min/1.66m2 Final   GFR, Estimated  Date Value Ref Range Status  07/01/2023 >60 >60 mL/min Final    Comment:    (NOTE) Calculated using the CKD-EPI Creatinine Equation (2021)

## 2023-08-10 ENCOUNTER — Other Ambulatory Visit (HOSPITAL_COMMUNITY): Payer: Self-pay | Admitting: Cardiology

## 2023-08-14 ENCOUNTER — Ambulatory Visit: Payer: Medicare Other | Admitting: Nurse Practitioner

## 2023-08-14 ENCOUNTER — Encounter: Payer: Self-pay | Admitting: Nurse Practitioner

## 2023-08-14 VITALS — BP 98/61 | HR 72 | Temp 97.4°F | Ht 62.0 in | Wt 225.2 lb

## 2023-08-14 DIAGNOSIS — E1142 Type 2 diabetes mellitus with diabetic polyneuropathy: Secondary | ICD-10-CM

## 2023-08-14 DIAGNOSIS — I700 Atherosclerosis of aorta: Secondary | ICD-10-CM

## 2023-08-14 DIAGNOSIS — I152 Hypertension secondary to endocrine disorders: Secondary | ICD-10-CM

## 2023-08-14 DIAGNOSIS — E039 Hypothyroidism, unspecified: Secondary | ICD-10-CM

## 2023-08-14 DIAGNOSIS — E1159 Type 2 diabetes mellitus with other circulatory complications: Secondary | ICD-10-CM | POA: Diagnosis not present

## 2023-08-14 MED ORDER — LEVOTHYROXINE SODIUM 50 MCG PO TABS: 50.0000 ug | ORAL_TABLET | Freq: Every day | ORAL | 1 refills | Status: DC

## 2023-08-14 MED ORDER — GLIMEPIRIDE 4 MG PO TABS: 4.0000 mg | ORAL_TABLET | Freq: Every day | ORAL | 1 refills | Status: DC

## 2023-08-14 MED ORDER — FREESTYLE LIBRE 2 SENSOR MISC: 1.0000 | 1 refills | Status: DC

## 2023-08-14 MED ORDER — TIZANIDINE HCL 4 MG PO TABS: 4.0000 mg | ORAL_TABLET | Freq: Four times a day (QID) | ORAL | 2 refills | Status: DC | PRN

## 2023-08-14 NOTE — Progress Notes (Signed)
BP 98/61 (BP Location: Left Arm, Patient Position: Sitting, Cuff Size: Normal)   Pulse 72   Temp (!) 97.4 F (36.3 C) (Oral)   Ht 5\' 2"  (1.575 m)   Wt 225 lb 3.2 oz (102.2 kg)   SpO2 95%   BMI 41.19 kg/m    Subjective:    Patient ID: Kim Buchanan, female    DOB: June 23, 1951, 72 y.o.   MRN: [id number]  HPI: Kim Buchanan is a 72 y.o. female  Chief Complaint  Patient presents with   Medical Management of Chronic Issues    5 month follow up, no new concerns today    Medication Refill    Tizanidine     HYPERTENSION / HYPERLIPIDEMIA Blood pressures are a little soft in the office.  Does not have a blood pressure cuff at home.   Satisfied with current treatment? yes Duration of hypertension: years BP monitoring frequency: a few times a month BP range:  BP medication side effects: no Past BP meds:  metoprolol and valsartan Duration of hyperlipidemia: years Cholesterol medication side effects: no Cholesterol supplements: none Past cholesterol medications: atorvastain (lipitor) Medication compliance: excellent compliance Aspirin: yes Recent stressors: no Recurrent headaches: no Visual changes: no Palpitations: no Dyspnea: yes Chest pain: no Lower extremity edema: no Dizzy/lightheaded: no  DIABETES Patient has established care with Endocrinology. Patient assistance has been completed for Ozempic.  She is going to be increased from 1mg  to 2mg  of Ozempic.  Last A1c in September 8.  Continued on Long and Short acting insulin.  Now using freestyle libre. Hypoglycemic episodes:no Polydipsia/polyuria: no Visual disturbance: no Chest pain: no Paresthesias: no Glucose Monitoring: yes  Accucheck frequency: in the morning  Fasting glucose: 120s  Post prandial:  Evening:  Before meals: Taking Insulin?: yes  Long acting insulin: Tresiba 100u  Short acting insulin: Humalog 12 TID Blood Pressure Monitoring: a few times a month Retinal Examination: Up to Date Foot  Exam: Up to Date Diabetic Education: Not Completed Pneumovax: Up to Date Influenza: Up to Date Aspirin: yes    Relevant past medical, surgical, family and social history reviewed and updated as indicated. Interim medical history since our last visit reviewed. Allergies and medications reviewed and updated.  Review of Systems  Eyes:  Negative for visual disturbance.  Respiratory:  Negative for cough, chest tightness and shortness of breath.   Cardiovascular:  Negative for chest pain, palpitations and leg swelling.  Neurological:  Negative for dizziness and headaches.    Per HPI unless specifically indicated above     Objective:    BP 98/61 (BP Location: Left Arm, Patient Position: Sitting, Cuff Size: Normal)   Pulse 72   Temp (!) 97.4 F (36.3 C) (Oral)   Ht 5\' 2"  (1.575 m)   Wt 225 lb 3.2 oz (102.2 kg)   SpO2 95%   BMI 41.19 kg/m   Wt Readings from Last 3 Encounters:  08/14/23 225 lb 3.2 oz (102.2 kg)  07/17/23 227 lb (103 kg)  07/01/23 226 lb (102.5 kg)    Physical Exam Vitals and nursing note reviewed.  Constitutional:      General: She is not in acute distress.    Appearance: Normal appearance. She is obese. She is not ill-appearing, toxic-appearing or diaphoretic.  HENT:     Head: Normocephalic.     Right Ear: External ear normal.     Left Ear: External ear normal.     Nose: Nose normal.     Mouth/Throat:  Mouth: Mucous membranes are moist.     Pharynx: Oropharynx is clear.  Eyes:     General:        Right eye: No discharge.        Left eye: No discharge.     Extraocular Movements: Extraocular movements intact.     Conjunctiva/sclera: Conjunctivae normal.     Pupils: Pupils are equal, round, and reactive to light.  Cardiovascular:     Rate and Rhythm: Normal rate and regular rhythm.     Heart sounds: No murmur heard. Pulmonary:     Effort: Pulmonary effort is normal. No respiratory distress.     Breath sounds: Normal breath sounds. No wheezing or  rales.  Abdominal:     General: Abdomen is flat. Bowel sounds are normal.     Palpations: Abdomen is soft.     Tenderness: There is abdominal tenderness in the right upper quadrant, epigastric area and left upper quadrant.  Musculoskeletal:     Cervical back: Normal range of motion and neck supple.  Skin:    General: Skin is warm and dry.     Capillary Refill: Capillary refill takes less than 2 seconds.  Neurological:     General: No focal deficit present.     Mental Status: She is alert and oriented to person, place, and time. Mental status is at baseline.  Psychiatric:        Mood and Affect: Mood normal.        Behavior: Behavior normal.        Thought Content: Thought content normal.        Judgment: Judgment normal.     Results for orders placed or performed in visit on 08/14/23  Hemoglobin A1c  Result Value Ref Range   Hemoglobin A1C 8.0       Assessment & Plan:   Problem List Items Addressed This Visit       Cardiovascular and Mediastinum   Hypertension associated with diabetes (HCC)    Chronic. Controlled.  Recommend checking blood pressures at home. If <100/70s, call the office.  Follow up in 6 months.  Will have labs done with Endocrinology.      Relevant Medications   glimepiride (AMARYL) 4 MG tablet   Aortic atherosclerosis (HCC)    Observed on CT in 2021.  Continue with ASA and Atorvastatin.          Endocrine   Type 2 diabetes mellitus with peripheral neuropathy (HCC)    Chronic.  Improving.  Will continue to let Endocrinology manage medications.  Currently on Ozempic 1mg .  Endo working to get dose increased to 2mg .  Continue with long and short acting insulin.  Follow up in 6 months.  Call sooner if concerns arise.       Relevant Medications   glimepiride (AMARYL) 4 MG tablet   tiZANidine (ZANAFLEX) 4 MG tablet   Continuous Glucose Sensor (FREESTYLE LIBRE 2 SENSOR) MISC   Hypothyroidism - Primary    Abnormal TSH as last Cardiology visit.  Patient  on amiodarone which is likely the cause of low TSH.  Will repeat labs at visit today.       Relevant Medications   levothyroxine (SYNTHROID) 50 MCG tablet   Other Relevant Orders   TSH   T4, free      Follow up plan: Return in about 6 months (around 02/12/2024) for HTN, HLD, DM2 FU.

## 2023-08-14 NOTE — Assessment & Plan Note (Signed)
Abnormal TSH as last Cardiology visit.  Patient on amiodarone which is likely the cause of low TSH.  Will repeat labs at visit today.

## 2023-08-14 NOTE — Assessment & Plan Note (Signed)
Observed on CT in 2021.  Continue with ASA and Atorvastatin.   

## 2023-08-14 NOTE — Assessment & Plan Note (Signed)
Chronic. Controlled.  Recommend checking blood pressures at home. If <100/70s, call the office.  Follow up in 6 months.  Will have labs done with Endocrinology.

## 2023-08-14 NOTE — Assessment & Plan Note (Signed)
Chronic.  Improving.  Will continue to let Endocrinology manage medications.  Currently on Ozempic 1mg .  Endo working to get dose increased to 2mg .  Continue with long and short acting insulin.  Follow up in 6 months.  Call sooner if concerns arise.

## 2023-08-15 ENCOUNTER — Emergency Department: Payer: Medicare Other

## 2023-08-15 ENCOUNTER — Other Ambulatory Visit: Payer: Self-pay

## 2023-08-15 ENCOUNTER — Inpatient Hospital Stay
Admission: EM | Admit: 2023-08-15 | Discharge: 2023-08-17 | DRG: 312 | Disposition: A | Discharge status: Home / Self Care | Payer: Medicare Other | Attending: Hospitalist | Admitting: Hospitalist

## 2023-08-15 ENCOUNTER — Ambulatory Visit: Payer: Self-pay | Admitting: *Deleted

## 2023-08-15 DIAGNOSIS — Z7951 Long term (current) use of inhaled steroids: Secondary | ICD-10-CM

## 2023-08-15 DIAGNOSIS — R578 Other shock: Secondary | ICD-10-CM | POA: Diagnosis not present

## 2023-08-15 DIAGNOSIS — Z6841 Body Mass Index (BMI) 40.0 and over, adult: Secondary | ICD-10-CM

## 2023-08-15 DIAGNOSIS — N179 Acute kidney failure, unspecified: Secondary | ICD-10-CM | POA: Diagnosis present

## 2023-08-15 DIAGNOSIS — F4310 Post-traumatic stress disorder, unspecified: Secondary | ICD-10-CM | POA: Diagnosis present

## 2023-08-15 DIAGNOSIS — K219 Gastro-esophageal reflux disease without esophagitis: Secondary | ICD-10-CM | POA: Diagnosis present

## 2023-08-15 DIAGNOSIS — E039 Hypothyroidism, unspecified: Secondary | ICD-10-CM | POA: Diagnosis present

## 2023-08-15 DIAGNOSIS — E860 Dehydration: Secondary | ICD-10-CM | POA: Diagnosis present

## 2023-08-15 DIAGNOSIS — I959 Hypotension, unspecified: Principal | ICD-10-CM | POA: Diagnosis present

## 2023-08-15 DIAGNOSIS — Z1152 Encounter for screening for COVID-19: Secondary | ICD-10-CM

## 2023-08-15 DIAGNOSIS — Z5986 Financial insecurity: Secondary | ICD-10-CM

## 2023-08-15 DIAGNOSIS — Z9079 Acquired absence of other genital organ(s): Secondary | ICD-10-CM

## 2023-08-15 DIAGNOSIS — I428 Other cardiomyopathies: Secondary | ICD-10-CM | POA: Diagnosis present

## 2023-08-15 DIAGNOSIS — Z794 Long term (current) use of insulin: Secondary | ICD-10-CM | POA: Diagnosis not present

## 2023-08-15 DIAGNOSIS — M5126 Other intervertebral disc displacement, lumbar region: Secondary | ICD-10-CM | POA: Diagnosis not present

## 2023-08-15 DIAGNOSIS — E875 Hyperkalemia: Secondary | ICD-10-CM | POA: Diagnosis present

## 2023-08-15 DIAGNOSIS — E871 Hypo-osmolality and hyponatremia: Secondary | ICD-10-CM | POA: Diagnosis not present

## 2023-08-15 DIAGNOSIS — Z7984 Long term (current) use of oral hypoglycemic drugs: Secondary | ICD-10-CM

## 2023-08-15 DIAGNOSIS — I5022 Chronic systolic (congestive) heart failure: Secondary | ICD-10-CM | POA: Diagnosis present

## 2023-08-15 DIAGNOSIS — F419 Anxiety disorder, unspecified: Secondary | ICD-10-CM | POA: Diagnosis present

## 2023-08-15 DIAGNOSIS — I420 Dilated cardiomyopathy: Secondary | ICD-10-CM | POA: Diagnosis not present

## 2023-08-15 DIAGNOSIS — I110 Hypertensive heart disease with heart failure: Secondary | ICD-10-CM | POA: Diagnosis present

## 2023-08-15 DIAGNOSIS — Z833 Family history of diabetes mellitus: Secondary | ICD-10-CM

## 2023-08-15 DIAGNOSIS — Z90710 Acquired absence of both cervix and uterus: Secondary | ICD-10-CM

## 2023-08-15 DIAGNOSIS — R570 Cardiogenic shock: Principal | ICD-10-CM | POA: Diagnosis present

## 2023-08-15 DIAGNOSIS — I952 Hypotension due to drugs: Secondary | ICD-10-CM | POA: Diagnosis not present

## 2023-08-15 DIAGNOSIS — E785 Hyperlipidemia, unspecified: Secondary | ICD-10-CM | POA: Diagnosis not present

## 2023-08-15 DIAGNOSIS — G4733 Obstructive sleep apnea (adult) (pediatric): Secondary | ICD-10-CM | POA: Diagnosis not present

## 2023-08-15 DIAGNOSIS — Z8249 Family history of ischemic heart disease and other diseases of the circulatory system: Secondary | ICD-10-CM

## 2023-08-15 DIAGNOSIS — I5023 Acute on chronic systolic (congestive) heart failure: Secondary | ICD-10-CM | POA: Diagnosis present

## 2023-08-15 DIAGNOSIS — J439 Emphysema, unspecified: Secondary | ICD-10-CM | POA: Diagnosis not present

## 2023-08-15 DIAGNOSIS — I2510 Atherosclerotic heart disease of native coronary artery without angina pectoris: Secondary | ICD-10-CM | POA: Diagnosis present

## 2023-08-15 DIAGNOSIS — E872 Acidosis, unspecified: Secondary | ICD-10-CM | POA: Diagnosis present

## 2023-08-15 DIAGNOSIS — R202 Paresthesia of skin: Secondary | ICD-10-CM | POA: Diagnosis not present

## 2023-08-15 DIAGNOSIS — T465X5A Adverse effect of other antihypertensive drugs, initial encounter: Secondary | ICD-10-CM | POA: Diagnosis present

## 2023-08-15 DIAGNOSIS — Z7985 Long-term (current) use of injectable non-insulin antidiabetic drugs: Secondary | ICD-10-CM

## 2023-08-15 DIAGNOSIS — R7401 Elevation of levels of liver transaminase levels: Secondary | ICD-10-CM | POA: Diagnosis present

## 2023-08-15 DIAGNOSIS — Z79890 Hormone replacement therapy (postmenopausal): Secondary | ICD-10-CM

## 2023-08-15 DIAGNOSIS — E1142 Type 2 diabetes mellitus with diabetic polyneuropathy: Secondary | ICD-10-CM | POA: Diagnosis not present

## 2023-08-15 DIAGNOSIS — F32A Depression, unspecified: Secondary | ICD-10-CM | POA: Diagnosis present

## 2023-08-15 DIAGNOSIS — I447 Left bundle-branch block, unspecified: Secondary | ICD-10-CM | POA: Diagnosis present

## 2023-08-15 DIAGNOSIS — Z87891 Personal history of nicotine dependence: Secondary | ICD-10-CM

## 2023-08-15 DIAGNOSIS — I472 Ventricular tachycardia, unspecified: Secondary | ICD-10-CM | POA: Diagnosis not present

## 2023-08-15 DIAGNOSIS — Z79899 Other long term (current) drug therapy: Secondary | ICD-10-CM

## 2023-08-15 DIAGNOSIS — M48061 Spinal stenosis, lumbar region without neurogenic claudication: Secondary | ICD-10-CM | POA: Diagnosis not present

## 2023-08-15 DIAGNOSIS — J449 Chronic obstructive pulmonary disease, unspecified: Secondary | ICD-10-CM | POA: Diagnosis not present

## 2023-08-15 DIAGNOSIS — E11649 Type 2 diabetes mellitus with hypoglycemia without coma: Secondary | ICD-10-CM | POA: Diagnosis not present

## 2023-08-15 DIAGNOSIS — Z90722 Acquired absence of ovaries, bilateral: Secondary | ICD-10-CM

## 2023-08-15 DIAGNOSIS — I672 Cerebral atherosclerosis: Secondary | ICD-10-CM | POA: Diagnosis not present

## 2023-08-15 DIAGNOSIS — M5124 Other intervertebral disc displacement, thoracic region: Secondary | ICD-10-CM | POA: Diagnosis not present

## 2023-08-15 DIAGNOSIS — R200 Anesthesia of skin: Secondary | ICD-10-CM | POA: Diagnosis not present

## 2023-08-15 DIAGNOSIS — M7989 Other specified soft tissue disorders: Secondary | ICD-10-CM | POA: Diagnosis not present

## 2023-08-15 DIAGNOSIS — Z7982 Long term (current) use of aspirin: Secondary | ICD-10-CM

## 2023-08-15 DIAGNOSIS — Z555 Less than a high school diploma: Secondary | ICD-10-CM

## 2023-08-15 DIAGNOSIS — M4804 Spinal stenosis, thoracic region: Secondary | ICD-10-CM | POA: Diagnosis not present

## 2023-08-15 LAB — CBC WITH DIFFERENTIAL/PLATELET
Abs Immature Granulocytes: 0.03 10*3/uL (ref 0.00–0.07)
Basophils Absolute: 0 10*3/uL (ref 0.0–0.1)
Basophils Relative: 1 %
Eosinophils Absolute: 0.1 10*3/uL (ref 0.0–0.5)
Eosinophils Relative: 1 %
HCT: 36.1 % (ref 36.0–46.0)
Hemoglobin: 11.2 g/dL — ABNORMAL LOW (ref 12.0–15.0)
Immature Granulocytes: 0 %
Lymphocytes Relative: 7 %
Lymphs Abs: 0.6 10*3/uL — ABNORMAL LOW (ref 0.7–4.0)
MCH: 27.7 pg (ref 26.0–34.0)
MCHC: 31 g/dL (ref 30.0–36.0)
MCV: 89.1 fL (ref 80.0–100.0)
Monocytes Absolute: 0.5 10*3/uL (ref 0.1–1.0)
Monocytes Relative: 6 %
Neutro Abs: 6.8 10*3/uL (ref 1.7–7.7)
Neutrophils Relative %: 85 %
Platelets: 194 10*3/uL (ref 150–400)
RBC: 4.05 MIL/uL (ref 3.87–5.11)
RDW: 18.2 % — ABNORMAL HIGH (ref 11.5–15.5)
WBC: 7.9 10*3/uL (ref 4.0–10.5)
nRBC: 0 % (ref 0.0–0.2)

## 2023-08-15 LAB — CBG MONITORING, ED: Glucose-Capillary: 107 mg/dL — ABNORMAL HIGH (ref 70–99)

## 2023-08-15 LAB — T4, FREE: Free T4: 1.39 ng/dL (ref 0.82–1.77)

## 2023-08-15 LAB — COMPREHENSIVE METABOLIC PANEL
ALT: 110 U/L — ABNORMAL HIGH (ref 0–44)
AST: 125 U/L — ABNORMAL HIGH (ref 15–41)
Albumin: 4.1 g/dL (ref 3.5–5.0)
Alkaline Phosphatase: 174 U/L — ABNORMAL HIGH (ref 38–126)
Anion gap: 13 (ref 5–15)
BUN: 64 mg/dL — ABNORMAL HIGH (ref 8–23)
CO2: 20 mmol/L — ABNORMAL LOW (ref 22–32)
Calcium: 8.3 mg/dL — ABNORMAL LOW (ref 8.9–10.3)
Chloride: 101 mmol/L (ref 98–111)
Creatinine, Ser: 2 mg/dL — ABNORMAL HIGH (ref 0.44–1.00)
GFR, Estimated: 26 mL/min — ABNORMAL LOW (ref >60)
Glucose, Bld: 59 mg/dL — ABNORMAL LOW (ref 70–99)
Potassium: 5.5 mmol/L — ABNORMAL HIGH (ref 3.5–5.1)
Sodium: 134 mmol/L — ABNORMAL LOW (ref 135–145)
Total Bilirubin: 1.5 mg/dL — ABNORMAL HIGH (ref 0.3–1.2)
Total Protein: 8.2 g/dL — ABNORMAL HIGH (ref 6.5–8.1)

## 2023-08-15 LAB — LACTIC ACID, PLASMA: Lactic Acid, Venous: 1.9 mmol/L (ref 0.5–1.9)

## 2023-08-15 LAB — TSH: TSH: 7.47 u[IU]/mL — ABNORMAL HIGH (ref 0.450–4.500)

## 2023-08-15 LAB — TROPONIN I (HIGH SENSITIVITY)
Troponin I (High Sensitivity): 4 ng/L (ref <18)
Troponin I (High Sensitivity): 4 ng/L (ref <18)

## 2023-08-15 LAB — BRAIN NATRIURETIC PEPTIDE: B Natriuretic Peptide: 29.6 pg/mL (ref 0.0–100.0)

## 2023-08-15 MED ORDER — DOCUSATE SODIUM 100 MG PO CAPS: 100.0000 mg | ORAL_CAPSULE | Freq: Two times a day (BID) | ORAL | Status: DC | PRN

## 2023-08-15 MED ORDER — IPRATROPIUM-ALBUTEROL 0.5-2.5 (3) MG/3ML IN SOLN: 3.0000 mL | Freq: Once | RESPIRATORY_TRACT | Status: DC

## 2023-08-15 MED ORDER — CALCIUM GLUCONATE-NACL 1-0.675 GM/50ML-% IV SOLN
1.0000 g | Freq: Once | INTRAVENOUS | Status: AC
  Administered 2023-08-15: 1000 mg via INTRAVENOUS
  Filled 2023-08-15: qty 50

## 2023-08-15 MED ORDER — METHYLPREDNISOLONE SODIUM SUCC 125 MG IJ SOLR: 125.0000 mg | Freq: Once | INTRAMUSCULAR | Status: DC

## 2023-08-15 MED ORDER — NOREPINEPHRINE 4 MG/250ML-% IV SOLN
0.0000 ug/min | INTRAVENOUS | Status: DC
Start: 2023-08-15 — End: 2023-08-17
  Administered 2023-08-15: 2 ug/min via INTRAVENOUS
  Filled 2023-08-15: qty 250

## 2023-08-15 MED ORDER — POLYETHYLENE GLYCOL 3350 17 G PO PACK: 17.0000 g | PACK | Freq: Every day | ORAL | Status: DC | PRN

## 2023-08-15 MED ORDER — SODIUM CHLORIDE 0.9 % IV BOLUS
1000.0000 mL | Freq: Once | INTRAVENOUS | Status: AC
  Administered 2023-08-15: 1000 mL via INTRAVENOUS

## 2023-08-15 MED ORDER — PANTOPRAZOLE SODIUM 40 MG PO TBEC: 40.0000 mg | DELAYED_RELEASE_TABLET | Freq: Every day | ORAL | Status: DC

## 2023-08-15 MED ORDER — SODIUM CHLORIDE 0.9 % IV BOLUS: 1000.0000 mL | Freq: Once | INTRAVENOUS | Status: DC

## 2023-08-15 MED ORDER — LEVOTHYROXINE SODIUM 75 MCG PO TABS
75.0000 ug | ORAL_TABLET | Freq: Every day | ORAL | 1 refills | Status: DC
Start: 2023-08-15 — End: 2023-09-04

## 2023-08-15 MED ORDER — HEPARIN SODIUM (PORCINE) 5000 UNIT/ML IJ SOLN
5000.0000 [IU] | Freq: Three times a day (TID) | INTRAMUSCULAR | Status: DC
  Administered 2023-08-15 – 2023-08-17 (×5): 5000 [IU] via SUBCUTANEOUS
  Filled 2023-08-15 (×5): qty 1

## 2023-08-15 MED ORDER — SODIUM ZIRCONIUM CYCLOSILICATE 10 G PO PACK
10.0000 g | PACK | Freq: Once | ORAL | Status: AC
  Administered 2023-08-15: 10 g via ORAL
  Filled 2023-08-15: qty 1

## 2023-08-15 NOTE — ED Notes (Signed)
Fsbs 107 

## 2023-08-15 NOTE — ED Triage Notes (Signed)
Patient reports bilateral leg numbness that started yesterday

## 2023-08-15 NOTE — ED Notes (Addendum)
Pure wick in place and connected to suction

## 2023-08-15 NOTE — ED Notes (Addendum)
Per Britton-Lee NP, okay to cycle pressure every for 1hr. If MAP and BP stay normal okay to cycle every 1hour after

## 2023-08-15 NOTE — Telephone Encounter (Signed)
Reason for Disposition  [1] Weakness of the face, arm / hand, or leg / foot on one side of the body AND [2] gradual onset (e.g., days to weeks) AND [3] present now  Answer Assessment - Initial Assessment Questions 1. SYMPTOM: "What is the main symptom you are concerned about?" (e.g., weakness, numbness)     Right leg numbness 2. ONSET: "When did this start?" (minutes, hours, days; while sleeping)     Started yesterday-  3. LAST NORMAL: "When was the last time you (the patient) were normal (no symptoms)?"     Days before 4. PATTERN "Does this come and go, or has it been constant since it started?"  "Is it present now?"     Worse when sitting 5. CARDIAC SYMPTOMS: "Have you had any of the following symptoms: chest pain, difficulty breathing, palpitations?"     no 6. NEUROLOGIC SYMPTOMS: "Have you had any of the following symptoms: headache, dizziness, vision loss, double vision, changes in speech, unsteady on your feet?"     Patient almost fell this morning 7. OTHER SYMPTOMS: "Do you have any other symptoms?"     no  Protocols used: Neurologic Deficit-A-AH

## 2023-08-15 NOTE — ED Triage Notes (Addendum)
Pt via POV from home. Pt c/o bilateral leg numbness. Denies pain. States numbness started yesterday. Pt is A&Ox4 but O2 sat initially 89% placed on 2L and increased to 93%. Also initial BP 80 SBP.

## 2023-08-15 NOTE — ED Notes (Signed)
Eating dinner meal

## 2023-08-15 NOTE — Telephone Encounter (Signed)
  Chief Complaint: numbness in right leg Symptoms: numbness in right leg- worse with sitting,  patient states she almost fell today due to numbness-dizziness- new onset Frequency: started yesterday- worse today Pertinent Negatives: Patient denies headache, vision loss, double vision, changes in speech  Disposition: [x] ED /[] Urgent Care (no appt availability in office) / [] Appointment(In office/virtual)/ []  [facility]/ [] Home Care/ [] Refused Recommended Disposition /[] [place] [facility]/ []  Follow-up with PCP Additional Notes: Patient advised ED due to high risk symptoms- no opening in office this afternoon.

## 2023-08-15 NOTE — ED Notes (Signed)
Pt to CT

## 2023-08-15 NOTE — ED Notes (Signed)
Resumed care from richard rn.   Iv meds started.  Pt alert, using phone.  Nsr on monitor.  Pt on 3 liters oxygen [place].

## 2023-08-15 NOTE — H&P (Signed)
NAME:  Kim Buchanan, MRN:  [id number], DOB:  12-15-1950, LOS: 0 ADMISSION DATE:  08/15/2023, CONSULTATION DATE:  08/15/23 REFERRING MD:  Dr. Roxan Hockey, CHIEF COMPLAINT: Numbness  History of Present Illness:  72 year old female presenting to [facility] ED on 08/15/2023 for evaluation of worsening BLE weakness, numbness and fatigue.  History provided per chart review and patient bedside report. Patient describes being in her normal state of health until 08/14/2023 when she began feeling weak.  She was having difficulty standing with particular weakness and numbness in her bilateral lower extremities, feeling as if her legs were giving out.  She describes several near syncopal episodes but did not fall or hit her head.  These episodes appear to occur only when changing position from sitting to standing, and she endorses correlating blurred vision that clears. She denies LOC, denies back pain, no focal deficits noted. She denies signs or symptoms of infection including fever/chills, abdominal pain/nausea/vomiting/diarrhea, dyspnea/productive cough or urinary symptoms.  She denies chest pain, palpitations, weight changes/edema, or headache. Patient denies tobacco abuse, EtOH/recreational drug use use.  She reports being prescribed oxygen at one time, but " a while ago it was taken away".  She denies CPAP use nightly.  ED course: Upon arrival patient alert and oriented with hypotension, other vital stable on 2 L [place].  IV fluid resuscitation administered without effect, given patient's chronic HFrEF no further IVF attempted.  Labs significant for mild hyponatremia, hyperkalemia, AKI with mild NAGMA, transaminitis which does appear to be close to baseline and hypoglycemia.  Imaging unremarkable.  Due to concern for cardiogenic shock vs cardiorenal syndrome EDP discussed with cardiology on-call who recommended low-dose peripheral Levophed overnight and ICU monitoring with cardiology follow-up on 08/16/2023.   Medications given: Calcium 1 g, 1 L NS bolus, Levophed drip started Initial Vitals: 98.8, 18, 82, 96/67 and 95% on 2 L nasal cannula Significant labs: (Labs/ Imaging personally reviewed) I, Cheryll Cockayne Rust-Chester, AGACNP-BC, personally viewed and interpreted this ECG. EKG Interpretation: Date: 08/15/2023, EKG Time: 16:45, Rate: 80, Rhythm: NSR, QRS Axis: LAD, Intervals: LBBB, ST/T Wave abnormalities: Nonspecific T wave abnormalities, Narrative Interpretation: NSR with LBBB and nonspecific T wave abnormalities Chemistry: Na+: 134, K+: 5.5, BUN/Cr.:  64/2, Serum CO2/ AG: 20/13, alk phos: 174, AST/ALT: 125/110, T.Bili: 1.5 Hematology: WBC: 7.9, Hgb: 11.2,  Troponin: 4, BNP: 29.6, Lactic: 1.9  CXR 08/15/2023: No active cardiopulmonary disease CT head without contrast 08/15/2023: No acute intracranial abnormality  PCCM consulted for admission due to mild circulatory shock suspected secondary to cardiogenic component requiring peripheral vasopressor support.  Pertinent  Medical History  Combined CHF (echo-11/20: LVEF 25-30%, echo 03/2023: LVEF 50 to 55%, G1 DD) Depression & anxiety Arthritis COPD Type 2 diabetes mellitus Hyperlipidemia Morbid obesity NICM Nonobstructive CAD PTSD OSA Former smoker Significant Hospital Events: Including procedures, antibiotic start and stop dates in addition to other pertinent events   08/15/2023: Admit patient to ICU per cardiology recommendations for mild circulatory shock suspected secondary to cardiogenic component requiring peripheral vasopressor support.  Interim History / Subjective:  Patient alert and oriented sitting and eating on 2 mcg Levophed drip with BP 140s over 80s. -Levophed stopped, vitals remained stable > level of care changed to stepdown for close monitoring -Plan of care discussed, all questions and concerns answered at this time  Objective   Blood pressure (!) 99/57, pulse 80, temperature 98.8 F (37.1 C), temperature source  Oral, resp. rate 20, SpO2 93%.    FiO2 (%):  [2 %] 2 %  No intake or output data in the 24 hours ending 08/15/23 2041 There were no vitals filed for this visit.  Examination: General: Adult female, acutely ill, lying in bed, NAD HEENT: MM pink/moist, anicteric, atraumatic, neck supple Neuro: A&O x 4, able to follow commands, PERRL +3, MAE CV: s1s2 RRR, NSR on monitor, no r/m/g Pulm: Regular, non labored on 2 L nasal cannula, breath sounds clear/diminished-BUL & diminished-BLL GI: soft, rounded, non tender, bs x 4 Skin: Limited exam- no rashes/lesions noted Extremities: warm/dry, pulses + 2 R/P, no edema noted  Resolved Hospital Problem list     Assessment & Plan:  Hypotension with suspected cardiogenic component Query orthostatic hypotension Hyperlipidemia CAD CHF PMHx: NICM, hypertension Patient appears to have stabilized, not requiring peripheral vasopressors at this point while lying down in bed.  Patient describes symptoms occur when changing positions quickly. -Orthostatic blood pressures when able -Cardiology following, appreciate input -Continuous cardiac monitoring -Utilize Levophed PRN to maintain MAP > 65, wean as tolerated -Hold outpatient regimen: Valsartan, torsemide, metoprolol, spironolactone > consider restarting as patient stabilizes -Continue amiodarone, ASA and Lipitor  COPD OSA -Supplemental O2 PRN to maintain SpO2 > 90% -Continue Incruse Ellipta daily -Bronchodilators PRN  Type 2 Diabetes Mellitus - Monitor CBG AC, HS - SSI moderate dosing - target range while in ICU: 140-180 - follow ICU hyper/hypo-glycemia protocol  Hypothyroidism -Continue outpatient Synthroid  Best Practice (right click and "Reselect all SmartList Selections" daily)  Diet/type: Regular consistency (see orders) DVT prophylaxis: prophylactic heparin  GI prophylaxis: PPI Lines: N/A Foley:  N/A Code Status:  full code Last date of multidisciplinary goals of care discussion  [08/15/23]  Labs   CBC: Recent Labs  Lab 08/15/23 1610  WBC 7.9  NEUTROABS 6.8  HGB 11.2*  HCT 36.1  MCV 89.1  PLT 194    Basic Metabolic Panel: Recent Labs  Lab 08/15/23 1610  NA 134*  K 5.5*  CL 101  CO2 20*  GLUCOSE 59*  BUN 64*  CREATININE 2.00*  CALCIUM 8.3*   GFR: Estimated Creatinine Clearance: 29.3 mL/min (A) (by C-G formula based on SCr of 2 mg/dL (H)). Recent Labs  Lab 08/15/23 1610 08/15/23 1652  WBC 7.9  --   LATICACIDVEN  --  1.9    Liver Function Tests: Recent Labs  Lab 08/15/23 1610  AST 125*  ALT 110*  ALKPHOS 174*  BILITOT 1.5*  PROT 8.2*  ALBUMIN 4.1   No results for input(s): "LIPASE", "AMYLASE" in the last 168 hours. No results for input(s): "AMMONIA" in the last 168 hours.  ABG    Component Value Date/Time   PHART 7.34 (L) 01/06/2018 0500   PCO2ART 45 01/06/2018 0500   PO2ART 86 01/06/2018 0500   HCO3 24.3 01/06/2018 0500   ACIDBASEDEF 1.6 01/06/2018 0500   O2SAT 95.9 01/06/2018 0500     Coagulation Profile: No results for input(s): "INR", "PROTIME" in the last 168 hours.  Cardiac Enzymes: No results for input(s): "CKTOTAL", "CKMB", "CKMBINDEX", "TROPONINI" in the last 168 hours.  HbA1C: Hemoglobin A1C  Date/Time Value Ref Range Status  07/11/2023 12:00 AM 8.0  Final    Comment:    care everywhere   HB A1C (BAYER DCA - WAIVED)  Date/Time Value Ref Range Status  01/23/2021 03:08 PM 8.6 (H) <7.0 % Final    Comment:  Diabetic Adult            <7.0                                       Healthy Adult        4.3 - 5.7                                                           (DCCT/NGSP) American Diabetes Association's Summary of Glycemic Recommendations for Adults with Diabetes: Hemoglobin A1c <7.0%. More stringent glycemic goals (A1c <6.0%) may further reduce complications at the cost of increased risk of hypoglycemia.   10/17/2020 10:21 AM 7.7 (H) <7.0 % Final    Comment:                                           Diabetic Adult            <7.0                                       Healthy Adult        4.3 - 5.7                                                           (DCCT/NGSP) American Diabetes Association's Summary of Glycemic Recommendations for Adults with Diabetes: Hemoglobin A1c <7.0%. More stringent glycemic goals (A1c <6.0%) may further reduce complications at the cost of increased risk of hypoglycemia.    Hgb A1c MFr Bld  Date/Time Value Ref Range Status  11/29/2022 09:10 AM 9.5 (H) 4.8 - 5.6 % Final    Comment:             Prediabetes: 5.7 - 6.4          Diabetes: >6.4          Glycemic control for adults with diabetes: <7.0   09/17/2022 03:45 PM 8.1 (H) 4.8 - 5.6 % Final    Comment:             Prediabetes: 5.7 - 6.4          Diabetes: >6.4          Glycemic control for adults with diabetes: <7.0     CBG: No results for input(s): "GLUCAP" in the last 168 hours.  Review of Systems: Positives in BOLD  Gen: Denies fever, chills, weight change, fatigue, night sweats HEENT: Denies blurred vision, double vision, hearing loss, tinnitus, sinus congestion, rhinorrhea, sore throat, neck stiffness, dysphagia PULM: Denies shortness of breath, cough, sputum production, hemoptysis, wheezing CV: Denies chest pain, edema, orthopnea, paroxysmal nocturnal dyspnea, palpitations GI: Denies abdominal pain, nausea, vomiting, diarrhea, hematochezia, melena, constipation, change in bowel habits GU: Denies dysuria, hematuria, polyuria, oliguria, urethral discharge Endocrine: Denies hot or cold intolerance, polyuria, polyphagia or appetite change Derm: Denies  rash, dry skin, scaling or peeling skin change Heme: Denies easy bruising, bleeding, bleeding gums Neuro: Denies headache, numbness, weakness, slurred speech, loss of memory or consciousness  Past Medical History:  She,  has a past medical history of Anxiety, Arthritis, COPD (chronic obstructive  pulmonary disease) (HCC), Depression, Diabetes mellitus without complication (HCC), GERD (gastroesophageal reflux disease), Hyperlipidemia, Morbid obesity (HCC), NICM (nonischemic cardiomyopathy) (HCC), Nonobstructive CAD (coronary artery disease), Osteoporosis, Panic attack, PTSD (post-traumatic stress disorder), PVC's (premature ventricular contractions), Rectal fistula, Sleep apnea, and Wears dentures.   Surgical History:   Past Surgical History:  Procedure Laterality Date   CARDIAC CATHETERIZATION     COLONOSCOPY  2014   COLONOSCOPY WITH PROPOFOL N/A 10/01/2016   Procedure: COLONOSCOPY WITH PROPOFOL;  Surgeon: Midge Minium, MD;  Location: [facility];  Service: Endoscopy;  Laterality: N/A;   COLONOSCOPY WITH PROPOFOL N/A 01/04/2022   Procedure: COLONOSCOPY WITH PROPOFOL;  Surgeon: Midge Minium, MD;  Location: [facility];  Service: Endoscopy;  Laterality: N/A;   CORONARY PRESSURE/FFR WITH 3D MAPPING N/A 09/11/2019   Procedure: Coronary Pressure Wire/FFR w/3D Mapping;  Surgeon: Yvonne Kendall, MD;  Location: [facility] IN[facility];  Service: Cardiovascular;  Laterality: N/A;   CYSTOCELE REPAIR N/A 12/17/2016   Procedure: ANTERIOR REPAIR (CYSTOCELE);  Surgeon: Herold Harms, MD;  Location: [facility] ORS;  Service: Gynecology;  Laterality: N/A;   ESOPHAGOGASTRODUODENOSCOPY (EGD) WITH PROPOFOL N/A 08/11/2019   Procedure: ESOPHAGOGASTRODUODENOSCOPY (EGD) WITH PROPOFOL;  Surgeon: Midge Minium, MD;  Location: [facility] ENDOSCOPY;  Service: Endoscopy;  Laterality: N/A;   POLYPECTOMY  10/01/2016   Procedure: POLYPECTOMY;  Surgeon: Midge Minium, MD;  Location: [facility];  Service: Endoscopy;;   RECTAL SURGERY  06/28/2015   Rectal prolapse, laparoscopic rectopexy the coldCharm[place], MD; [facility]   RIGHT HEART CATH N/A 03/21/2021   Procedure: RIGHT HEART CATH;  Surgeon: Yvonne Kendall, MD;  Location: [facility] INVAS[facility] CV LAB;  Service: Cardiovascular;  Laterality: N/A;   RIGHT/LEFT HEART CATH  AND CORONARY ANGIOGRAPHY Bilateral 09/11/2019   Procedure: RIGHT/LEFT HEART CATH AND CORONARY ANGIOGRAPHY;  Surgeon: Antonieta Iba, MD;  Location: [facility] INVAS[facility] CV LAB;  Service: Cardiovascular;  Laterality: Bilateral;   TOTAL ABDOMINAL HYSTERECTOMY W/ BILATERAL SALPINGOOPHORECTOMY     TUBAL LIGATION     VAGINAL HYSTERECTOMY Bilateral 12/17/2016   Procedure: HYSTERECTOMY VAGINAL WITH BILATERAL SALPINGO OOPHERECTOMY;  Surgeon: Herold Harms, MD;  Location: [facility] ORS;  Service: Gynecology;  Laterality: Bilateral;     Social History:   reports that she quit smoking about 4 years ago. Her smoking use included cigarettes. She started smoking about 54 years ago. She has a 75 pack-year smoking history. She has never used smokeless tobacco. She reports that she does not drink alcohol and does not use drugs.   Family History:  Her family history includes Cancer in her sister; Diabetes in her brother, brother, mother, sister, and sister; Healthy in her sister; Heart disease in her brother and father; Hypertension in her father; Lymphoma in her brother and brother. There is no history of Breast cancer, Ovarian cancer, or Colon cancer.   Allergies Allergies  Allergen Reactions   Entresto [Sacubitril-Valsartan]     Cough    Lisinopril Cough     Home Medications  Prior to Admission medications   Medication Sig Start Date End Date Taking? Authorizing Provider  amiodarone (PACERONE) 200 MG tablet Take 0.5 tablets (100 mg total) by mouth daily. 08/31/22   Jacklynn Ganong, FNP  aspirin 81 MG tablet Take 81 mg by  mouth daily.     [provider]  atorvastatin (LIPITOR) 10 MG tablet TAKE 1 TABLET BY MOUTH EVERY  NIGHT AT BEDTIME 01/07/23   Laurey Morale, MD  clotrimazole-betamethasone (LOTRISONE) cream Apply 1 Application topically daily. 04/23/23   [name], NP  Continuous Blood Gluc Receiver (FREESTYLE LIBRE 2 READER) DEVI 1 each by Does not apply route daily. 09/18/22    [name], NP  Continuous Glucose Sensor (FREESTYLE LIBRE 2 SENSOR) MISC 1 each by Does not apply route every 14 (fourteen) days. 08/14/23   [name], NP  Fluticasone-Umeclidin-Vilant (TRELEGY ELLIPTA) 100-62.5-25 MCG/ACT AEPB Inhale 1 puff into the lungs daily. 07/19/23   Assaker, [place], MD  gabapentin (NEURONTIN) 300 MG capsule Take 1 capsule (300 mg total) by mouth 3 (three) times daily. 01/26/21   [name], NP  glimepiride (AMARYL) 4 MG tablet Take 1 tablet (4 mg total) by mouth daily with breakfast. 08/14/23   [name], NP  Insulin Degludec (TRESIBA FLEXTOUCH [place]) Inject 100 Units into the skin at bedtime.    [provider]  insulin lispro (HUMALOG KWIKPEN) 100 UNIT/ML KwikPen Inject 5 Units into the skin 3 (three) times daily. 09/17/22   [name], NP  Insulin Pen Needle 30G X 6 MM MISC 1 Application by Does not apply route daily. 05/04/22   [name], NP  Lancets [facility] DELICA PLUS [place]) MISC TEST ONCE DAILY 10/04/22   [name], NP  levothyroxine (SYNTHROID) 75 MCG tablet Take 1 tablet (75 mcg total) by mouth daily before breakfast. 08/15/23   [name], NP  metFORMIN (GLUCOPHAGE) 1000 MG tablet TAKE 1 TABLET BY MOUTH TWICE  DAILY WITH MEALS 05/24/23   Mecum, Erin E, PA-C  metoprolol succinate (TOPROL-XL) 50 MG 24 hr tablet TAKE 1 TABLET BY MOUTH DAILY 12/28/22   Laurey Morale, MD  nitroGLYCERIN (NITROSTAT) 0.3 MG SL tablet Place 1 tablet (0.3 mg total) under the tongue every 5 (five) minutes as needed for chest pain. 08/22/22 08/22/23  Jacklynn Ganong, FNP  ONETOUCH ULTRA test strip TEST ONCE DAILY 09/19/21   [name], NP  pantoprazole (PROTONIX) 40 MG tablet TAKE 1 TABLET BY MOUTH DAILY 03/11/23   [name], NP  polyethylene glycol powder (GLYCOLAX/MIRALAX) 17 GM/SCOOP powder Take 17 g by mouth daily. 07/14/15   [provider]  QUEtiapine (SEROQUEL) 200 MG tablet TAKE 1 TABLET BY  MOUTH DAILY AT  BEDTIME 08/09/23   [name], NP  Semaglutide, 1 MG/DOSE, (OZEMPIC, 1 MG/DOSE,) 4 MG/3ML SOPN Inject 1 mg into the skin once a week.    [provider]  spironolactone (ALDACTONE) 25 MG tablet TAKE 1 TABLET BY MOUTH DAILY 06/04/23   Laurey Morale, MD  tiZANidine (ZANAFLEX) 4 MG tablet Take 1 tablet (4 mg total) by mouth every 6 (six) hours as needed for muscle spasms. 08/14/23   [name], NP  torsemide (DEMADEX) 20 MG tablet TAKE 2 TABLETS BY MOUTH DAILY  ALTERNATING WITH TAKE 1 TABLET  DAILY 02/04/23   Bensimhon, Bevelyn Buckles, MD  triamcinolone cream (KENALOG) 0.1 % Apply 1 Application topically 2 (two) times daily. 04/17/23   [name], NP  umeclidinium bromide (INCRUSE ELLIPTA) 62.5 MCG/ACT AEPB Inhale 1 puff into the lungs daily. 07/17/23   Assaker, [place], MD  valsartan (DIOVAN) 40 MG tablet TAKE 1 AND 1/2 TABLETS BY MOUTH  TWICE DAILY 08/12/23   Laurey Morale, MD     Critical care time: 65 minutes  Cheryll Cockayne Rust-Chester, AGACNP-BC Acute Care Nurse Practitioner [facility]   [phone] / [phone] Please see Amion for details.

## 2023-08-15 NOTE — ED Provider Notes (Signed)
[facility] Provider Note    Event Date/Time   First MD Initiated Contact with Patient 08/15/23 1641     (approximate)   History   Numbness   HPI  Kim Buchanan is a 72 y.o. female with a history of chronic systolic heart failure presents to the ER for evaluation of worsening lower extremity weakness malaise over the past 24 hours.  Started feeling weak yesterday.  Has had several near syncopal episodes but did not fall or hit her head.  Denies any back pain.  No lateralizing weakness.    Physical Exam   Triage Vital Signs: ED Triage Vitals  Encounter Vitals Group     BP 08/15/23 1610 (!) 80/41     Systolic BP Percentile --      Diastolic BP Percentile --      Pulse Rate 08/15/23 1604 88     Resp 08/15/23 1604 20     Temp 08/15/23 1604 98.8 F (37.1 C)     Temp Source 08/15/23 1604 Oral     SpO2 08/15/23 1604 (!) 89 %     Weight --      Height --      Head Circumference --      Peak Flow --      Pain Score 08/15/23 1631 0     Pain Loc --      Pain Education --      Exclude from Growth Chart --     Most recent vital signs: Vitals:   08/15/23 1741 08/15/23 1823  BP: (!) 90/51 (!) 85/50  Pulse:  79  Resp:    Temp:    SpO2: 94% 93%     Constitutional: Alert  Eyes: Conjunctivae are normal.  Head: Atraumatic. Nose: No congestion/rhinnorhea. Mouth/Throat: Mucous membranes are moist.   Neck: Painless ROM.  Cardiovascular:   Good peripheral circulation.  Extremities are warm to touch. Respiratory: Diminished bibasilar breath sounds. Gastrointestinal: Soft and nontender.  Musculoskeletal:  no deformity Neurologic:  MAE spontaneously. No gross focal neurologic deficits are appreciated.  Skin:  Skin is warm, dry and intact. No rash noted. Psychiatric: Mood and affect are normal. Speech and behavior are normal.    ED Results / Procedures / Treatments   Labs (all labs ordered are listed, but only abnormal results are  displayed) Labs Reviewed  CBC WITH DIFFERENTIAL/PLATELET - Abnormal; Notable for the following components:      Result Value   Hemoglobin 11.2 (*)    RDW 18.2 (*)    Lymphs Abs 0.6 (*)    All other components within normal limits  COMPREHENSIVE METABOLIC PANEL - Abnormal; Notable for the following components:   Sodium 134 (*)    Potassium 5.5 (*)    CO2 20 (*)    Glucose, Bld 59 (*)    BUN 64 (*)    Creatinine, Ser 2.00 (*)    Calcium 8.3 (*)    Total Protein 8.2 (*)    AST 125 (*)    ALT 110 (*)    Alkaline Phosphatase 174 (*)    Total Bilirubin 1.5 (*)    GFR, Estimated 26 (*)    All other components within normal limits  LACTIC ACID, PLASMA  BRAIN NATRIURETIC PEPTIDE  TROPONIN I (HIGH SENSITIVITY)  TROPONIN I (HIGH SENSITIVITY)     EKG  ED ECG REPORT I, Willy Eddy, the attending physician, personally viewed and interpreted this ECG.   Date: 08/15/2023  EKG Time: 16:45  Rate: 80  Rhythm: sinus  Axis: left  Intervals:lbbb  ST&T Change: no stemi, nonspecific t wave abn    RADIOLOGY Please see ED Course for my review and interpretation.  I personally reviewed all radiographic images ordered to evaluate for the above acute complaints and reviewed radiology reports and findings.  These findings were personally discussed with the patient.  Please see medical record for radiology report.    PROCEDURES:  Critical Care performed: Yes, see critical care procedure note(s)  .Critical Care  Performed by: Willy Eddy, MD Authorized by: Willy Eddy, MD   Critical care provider statement:    Critical care time (minutes):  35   Critical care was necessary to treat or prevent imminent or life-threatening deterioration of the following conditions:  Circulatory failure   Critical care was time spent personally by me on the following activities:  Ordering and performing treatments and interventions, ordering and review of laboratory studies, ordering and  review of radiographic studies, pulse oximetry, re-evaluation of patient's condition, review of old charts, obtaining history from patient or surrogate, examination of patient, evaluation of patient's response to treatment, discussions with primary provider, discussions with consultants and development of treatment plan with patient or surrogate    MEDICATIONS ORDERED IN ED: Medications  norepinephrine (LEVOPHED) 4mg  in (0.016 mg/mL) premix infusion (has no administration in time range)  calcium gluconate 1 g/ 50 mL sodium chloride IVPB (has no administration in time range)  sodium chloride 0.9 % bolus 1,000 mL (0 mLs Intravenous Stopped 08/15/23 1823)     IMPRESSION / MDM / ASSESSMENT AND PLAN / ED COURSE  I reviewed the triage vital signs and the nursing notes.                              Differential diagnosis includes, but is not limited to, dehydration, electrolyte abnormality, sepsis, CHF, CVA  Patient presenting to the ER for evaluation of symptoms as described above.  Based on symptoms, risk factors and considered above differential, this presenting complaint could reflect a potentially life-threatening illness therefore the patient will be placed on continuous pulse oximetry and telemetry for monitoring.  Laboratory evaluation will be sent to evaluate for the above complaints.      Clinical Course as of 08/15/23 1939  Thu Aug 15, 2023  1800 Lactate normal.  Based on her presentation I am concerned for worsening CHF systolic heart failure.  Will consult cardiology. [PR]  1832 Spite IV fluids patient still with soft blood pressures.  I have paged cardiology.  Will page intensivist.  Will place on vasopressor. [PR]  1904 Have discussed case in consultation with Dr. Cristal Deer of cardiology.  She does not appear" cardiogenic shock but is persistently hypotensive with soft maps.  Will place on Levophed.  I have consulted ICU for admission. [PR]    Clinical Course User  Index [PR] Willy Eddy, MD     FINAL CLINICAL IMPRESSION(S) / ED DIAGNOSES   Final diagnoses:  Hypotension, unspecified hypotension type  CHF (congestive heart failure), NYHA class I, acute on chronic, systolic (HCC)     Rx / DC Orders   ED Discharge Orders     None        Note:  This document was prepared using Dragon voice recognition software and may include unintentional dictation errors.    Willy Eddy, MD 08/15/23 [phone]

## 2023-08-15 NOTE — ED Notes (Signed)
Pt to ed from home for bilateral leg weakness since yesterday. Pt experiencing hypotension and requiring 2L [place].

## 2023-08-15 NOTE — Addendum Note (Signed)
Addended by: [name] on: 08/15/2023 08:15 AM   Modules accepted: Orders

## 2023-08-15 NOTE — ED Provider Triage Note (Signed)
Emergency Medicine Provider Triage Evaluation Note  Kim Buchanan , a 72 y.o. female  was evaluated in triage.  Pt complains of bilateral leg numbness. This started yesterday. States she almost fell this morning due to it being numb.  Review of Systems  Positive: Numbness in legs, lightheaded, dizzy Negative: Weakness, pain in legs  Physical Exam  There were no vitals taken for this visit. Gen:   Awake, no distress   Resp:  Normal effort  MSK:   Moves extremities without difficulty, sensation intact across all dermatomes of lower extremities, 5//5 strength in lower extremities Other:    Medical Decision Making  Medically screening exam initiated at 4:06 PM.  Appropriate orders placed.  Kim Buchanan was informed that the remainder of the evaluation will be completed by another provider, this initial triage assessment does not replace that evaluation, and the importance of remaining in the ED until their evaluation is complete.     Cameron Ali, PA-C 08/15/23 1609

## 2023-08-16 ENCOUNTER — Inpatient Hospital Stay (HOSPITAL_COMMUNITY)
Admit: 2023-08-16 | Discharge: 2023-08-16 | Disposition: A | Discharge status: Home / Self Care | Payer: Medicare Other | Attending: Internal Medicine | Admitting: Internal Medicine

## 2023-08-16 ENCOUNTER — Inpatient Hospital Stay: Payer: Medicare Other

## 2023-08-16 ENCOUNTER — Other Ambulatory Visit: Payer: Self-pay

## 2023-08-16 DIAGNOSIS — R578 Other shock: Secondary | ICD-10-CM | POA: Diagnosis not present

## 2023-08-16 DIAGNOSIS — I959 Hypotension, unspecified: Secondary | ICD-10-CM | POA: Diagnosis present

## 2023-08-16 DIAGNOSIS — R570 Cardiogenic shock: Secondary | ICD-10-CM | POA: Diagnosis not present

## 2023-08-16 LAB — LACTIC ACID, PLASMA
Lactic Acid, Venous: 0.9 mmol/L (ref 0.5–1.9)
Lactic Acid, Venous: 1.1 mmol/L (ref 0.5–1.9)

## 2023-08-16 LAB — URINALYSIS, COMPLETE (UACMP) WITH MICROSCOPIC
Bilirubin Urine: NEGATIVE
Glucose, UA: NEGATIVE mg/dL
Ketones, ur: NEGATIVE mg/dL
Nitrite: NEGATIVE
Protein, ur: NEGATIVE mg/dL
Specific Gravity, Urine: 1.011 (ref 1.005–1.030)
pH: 6 (ref 5.0–8.0)

## 2023-08-16 LAB — MAGNESIUM: Magnesium: 2.7 mg/dL — ABNORMAL HIGH (ref 1.7–2.4)

## 2023-08-16 LAB — ECHOCARDIOGRAM COMPLETE
AR max vel: 2.26 cm2
AV Area VTI: 2.18 cm2
AV Area mean vel: 2.41 cm2
AV Mean grad: 8 mm[Hg]
AV Peak grad: 13.1 mm[Hg]
Ao pk vel: 1.81 m/s
Area-P 1/2: 3.63 cm2
MV VTI: 2.49 cm2
S' Lateral: 2.15 cm

## 2023-08-16 LAB — CBC
HCT: 30.5 % — ABNORMAL LOW (ref 36.0–46.0)
Hemoglobin: 9.7 g/dL — ABNORMAL LOW (ref 12.0–15.0)
MCH: 27.6 pg (ref 26.0–34.0)
MCHC: 31.8 g/dL (ref 30.0–36.0)
MCV: 86.6 fL (ref 80.0–100.0)
Platelets: 168 10*3/uL (ref 150–400)
RBC: 3.52 MIL/uL — ABNORMAL LOW (ref 3.87–5.11)
RDW: 18.2 % — ABNORMAL HIGH (ref 11.5–15.5)
WBC: 5.3 10*3/uL (ref 4.0–10.5)
nRBC: 0 % (ref 0.0–0.2)

## 2023-08-16 LAB — COMPREHENSIVE METABOLIC PANEL
ALT: 152 U/L — ABNORMAL HIGH (ref 0–44)
AST: 161 U/L — ABNORMAL HIGH (ref 15–41)
Albumin: 3.6 g/dL (ref 3.5–5.0)
Alkaline Phosphatase: 143 U/L — ABNORMAL HIGH (ref 38–126)
Anion gap: 9 (ref 5–15)
BUN: 52 mg/dL — ABNORMAL HIGH (ref 8–23)
CO2: 23 mmol/L (ref 22–32)
Calcium: 8.4 mg/dL — ABNORMAL LOW (ref 8.9–10.3)
Chloride: 107 mmol/L (ref 98–111)
Creatinine, Ser: 1.23 mg/dL — ABNORMAL HIGH (ref 0.44–1.00)
GFR, Estimated: 47 mL/min — ABNORMAL LOW (ref >60)
Glucose, Bld: 117 mg/dL — ABNORMAL HIGH (ref 70–99)
Potassium: 4.3 mmol/L (ref 3.5–5.1)
Sodium: 139 mmol/L (ref 135–145)
Total Bilirubin: 1.5 mg/dL — ABNORMAL HIGH (ref 0.3–1.2)
Total Protein: 7.2 g/dL (ref 6.5–8.1)

## 2023-08-16 LAB — PROCALCITONIN: Procalcitonin: 1.1 ng/mL

## 2023-08-16 LAB — GLUCOSE, CAPILLARY
Glucose-Capillary: 133 mg/dL — ABNORMAL HIGH (ref 70–99)
Glucose-Capillary: 140 mg/dL — ABNORMAL HIGH (ref 70–99)
Glucose-Capillary: 105 mg/dL — ABNORMAL HIGH (ref 70–99)
Glucose-Capillary: 119 mg/dL — ABNORMAL HIGH (ref 70–99)
Glucose-Capillary: 127 mg/dL — ABNORMAL HIGH (ref 70–99)
Glucose-Capillary: 95 mg/dL (ref 70–99)

## 2023-08-16 LAB — MRSA NEXT GEN BY PCR, NASAL: MRSA by PCR Next Gen: NOT DETECTED

## 2023-08-16 LAB — HIV ANTIBODY (ROUTINE TESTING W REFLEX): HIV Screen 4th Generation wRfx: NONREACTIVE

## 2023-08-16 LAB — PHOSPHORUS: Phosphorus: 5 mg/dL — ABNORMAL HIGH (ref 2.5–4.6)

## 2023-08-16 MED ORDER — IPRATROPIUM-ALBUTEROL 0.5-2.5 (3) MG/3ML IN SOLN: 3.0000 mL | RESPIRATORY_TRACT | Status: DC | PRN

## 2023-08-16 MED ORDER — QUETIAPINE FUMARATE 25 MG PO TABS
100.0000 mg | ORAL_TABLET | Freq: Every day | ORAL | Status: DC
  Administered 2023-08-16 (×2): 100 mg via ORAL
  Filled 2023-08-16: qty 4
  Filled 2023-08-16: qty 1

## 2023-08-16 MED ORDER — AMIODARONE HCL 200 MG PO TABS
100.0000 mg | ORAL_TABLET | Freq: Every day | ORAL | Status: DC
  Administered 2023-08-16 – 2023-08-17 (×2): 100 mg via ORAL
  Filled 2023-08-16 (×2): qty 1

## 2023-08-16 MED ORDER — ORAL CARE MOUTH RINSE: 15.0000 mL | OROMUCOSAL | Status: DC | PRN

## 2023-08-16 MED ORDER — MIDODRINE HCL 5 MG PO TABS: 5.0000 mg | ORAL_TABLET | Freq: Three times a day (TID) | ORAL | Status: DC

## 2023-08-16 MED ORDER — ASPIRIN 81 MG PO TBEC
81.0000 mg | DELAYED_RELEASE_TABLET | Freq: Every day | ORAL | Status: DC
  Administered 2023-08-16 – 2023-08-17 (×2): 81 mg via ORAL
  Filled 2023-08-16 (×2): qty 1

## 2023-08-16 MED ORDER — TRAZODONE HCL 50 MG PO TABS
50.0000 mg | ORAL_TABLET | Freq: Every evening | ORAL | Status: DC | PRN
  Administered 2023-08-16: 50 mg via ORAL
  Filled 2023-08-16: qty 1

## 2023-08-16 MED ORDER — LEVOTHYROXINE SODIUM 50 MCG PO TABS
75.0000 ug | ORAL_TABLET | Freq: Every day | ORAL | Status: DC
  Administered 2023-08-16 – 2023-08-17 (×2): 75 ug via ORAL
  Filled 2023-08-16 (×2): qty 2

## 2023-08-16 MED ORDER — SODIUM CHLORIDE 0.9 % IV BOLUS
250.0000 mL | Freq: Once | INTRAVENOUS | Status: AC
  Administered 2023-08-16: 250 mL via INTRAVENOUS

## 2023-08-16 MED ORDER — UMECLIDINIUM BROMIDE 62.5 MCG/ACT IN AEPB
1.0000 | INHALATION_SPRAY | Freq: Every day | RESPIRATORY_TRACT | Status: DC
  Administered 2023-08-16: 1 via RESPIRATORY_TRACT
  Filled 2023-08-16: qty 7

## 2023-08-16 MED ORDER — CHLORHEXIDINE GLUCONATE CLOTH 2 % EX PADS
6.0000 | MEDICATED_PAD | Freq: Every day | CUTANEOUS | Status: DC
  Administered 2023-08-16: 6 via TOPICAL

## 2023-08-16 MED ORDER — INSULIN ASPART 100 UNIT/ML IJ SOLN
0.0000 [IU] | Freq: Three times a day (TID) | INTRAMUSCULAR | Status: DC
  Administered 2023-08-16 – 2023-08-17 (×3): 2 [IU] via SUBCUTANEOUS
  Filled 2023-08-16 (×3): qty 1

## 2023-08-16 MED ORDER — MIDODRINE HCL 5 MG PO TABS
5.0000 mg | ORAL_TABLET | Freq: Three times a day (TID) | ORAL | Status: DC
  Administered 2023-08-16 – 2023-08-17 (×3): 5 mg via ORAL
  Filled 2023-08-16 (×3): qty 1

## 2023-08-16 MED ORDER — ATORVASTATIN CALCIUM 10 MG PO TABS
10.0000 mg | ORAL_TABLET | Freq: Every day | ORAL | Status: DC
  Administered 2023-08-16 (×2): 10 mg via ORAL
  Filled 2023-08-16 (×2): qty 1

## 2023-08-16 MED ORDER — PANTOPRAZOLE SODIUM 40 MG PO TBEC
40.0000 mg | DELAYED_RELEASE_TABLET | Freq: Every day | ORAL | Status: DC
  Administered 2023-08-16 – 2023-08-17 (×2): 40 mg via ORAL
  Filled 2023-08-16 (×2): qty 1

## 2023-08-16 NOTE — ED Notes (Signed)
Cheryll Cockayne NP informed of pts temp of 100.4

## 2023-08-16 NOTE — Consult Note (Signed)
PHARMACY CONSULT NOTE - ELECTROLYTES  Pharmacy Consult for Electrolyte Monitoring and Replacement   Recent Labs: Estimated Creatinine Clearance: 47.6 mL/min (A) (by C-G formula based on SCr of 1.23 mg/dL (H)).  Potassium (mmol/L)  Date Value  08/16/2023 4.3  02/24/2013 4.1   Magnesium (mg/dL)  Date Value  40/98/1191 2.7 (H)   Calcium (mg/dL)  Date Value  47/82/9562 8.4 (L)   Calcium, Total (mg/dL)  Date Value  13/05/6577 8.6   Albumin (g/dL)  Date Value  46/96/2952 3.6  01/15/2023 4.0  02/24/2013 3.6   Phosphorus (mg/dL)  Date Value  84/13/2440 5.0 (H)   Sodium (mmol/L)  Date Value  08/16/2023 139  01/15/2023 137  02/24/2013 143   Corrected Ca: 8.72 mg/dL  Assessment  Kim Buchanan is a 72 y.o. female presenting with numbness and worsening lower extremity weakness. She reports multiple near syncopal episodes. PMH significant for T2DM, combined CHF, COPD, HLD, CAD, OSA. Pharmacy has been consulted to monitor and replace electrolytes.  K+ 5.5 on arrival - received Lokelma 10 gm PO x 1 and calcium gluconate 1 gm IV x 1  Diet: PO MIVF: none  Goal of Therapy: Electrolytes WNL  Plan:  No electrolyte replacement warranted at this time  Check BMP, Mg, Phos with AM labs  Thank you for allowing pharmacy to be a part of this patient's care.  Littie Deeds, PharmD Pharmacy Resident  08/16/2023 7:22 AM

## 2023-08-16 NOTE — Progress Notes (Signed)
*  PRELIMINARY RESULTS* Echocardiogram 2D Echocardiogram has been performed.  Kim Buchanan 08/16/2023, 7:58 AM

## 2023-08-16 NOTE — Progress Notes (Signed)
NAME:  Kim Buchanan, MRN:  [id number], DOB:  [birth year], LOS: 1 ADMISSION DATE:  08/15/2023, CONSULTATION DATE:  08/15/23 REFERRING MD:  Dr. Roxan Hockey, CHIEF COMPLAINT: Numbness  History of Present Illness:  72 year old female presenting to [facility] ED on 08/15/2023 for evaluation of worsening BLE weakness, numbness and fatigue.  History provided per chart review and patient bedside report. Patient describes being in her normal state of health until 08/14/2023 when she began feeling weak.  She was having difficulty standing with particular weakness and numbness in her bilateral lower extremities, feeling as if her legs were giving out.  She describes several near syncopal episodes but did not fall or hit her head.  These episodes appear to occur only when changing position from sitting to standing, and she endorses correlating blurred vision that clears. She denies LOC, denies back pain, no focal deficits noted. She denies signs or symptoms of infection including fever/chills, abdominal pain/nausea/vomiting/diarrhea, dyspnea/productive cough or urinary symptoms.  She denies chest pain, palpitations, weight changes/edema, or headache. Patient denies tobacco abuse, EtOH/recreational drug use use.  She reports being prescribed oxygen at one time, but " a while ago it was taken away".  She denies CPAP use nightly.  ED course: Upon arrival patient alert and oriented with hypotension, other vital stable on 2 L [place].  IV fluid resuscitation administered without effect, given patient's chronic HFrEF no further IVF attempted.  Labs significant for mild hyponatremia, hyperkalemia, AKI with mild NAGMA, transaminitis which does appear to be close to baseline and hypoglycemia.  Imaging unremarkable.  Due to concern for cardiogenic shock vs cardiorenal syndrome EDP discussed with cardiology on-call who recommended low-dose peripheral Levophed overnight and ICU monitoring with cardiology follow-up on 08/16/2023.   Medications given: Calcium 1 g, 1 L NS bolus, Levophed drip started Initial Vitals: 98.8, 18, 82, 96/67 and 95% on 2 L nasal cannula Significant labs: (Labs/ Imaging personally reviewed) I, Cheryll Cockayne Rust-Chester, AGACNP-BC, personally viewed and interpreted this ECG. EKG Interpretation: Date: 08/15/2023, EKG Time: 16:45, Rate: 80, Rhythm: NSR, QRS Axis: LAD, Intervals: LBBB, ST/T Wave abnormalities: Nonspecific T wave abnormalities, Narrative Interpretation: NSR with LBBB and nonspecific T wave abnormalities Chemistry: Na+: 134, K+: 5.5, BUN/Cr.:  64/2, Serum CO2/ AG: 20/13, alk phos: 174, AST/ALT: 125/110, T.Bili: 1.5 Hematology: WBC: 7.9, Hgb: 11.2,  Troponin: 4, BNP: 29.6, Lactic: 1.9  CXR 08/15/2023: No active cardiopulmonary disease CT head without contrast 08/15/2023: No acute intracranial abnormality  PCCM consulted for admission due to mild circulatory shock suspected secondary to cardiogenic component requiring peripheral vasopressor support.  Pertinent  Medical History  Combined CHF (echo-11/20: LVEF 25-30%, echo 03/2023: LVEF 50 to 55%, G1 DD) Depression & anxiety Arthritis COPD Type 2 diabetes mellitus Hyperlipidemia Morbid obesity NICM Nonobstructive CAD PTSD OSA Former smoker Significant Hospital Events: Including procedures, antibiotic start and stop dates in addition to other pertinent events   08/15/2023: Admit patient to ICU per cardiology recommendations for mild circulatory shock suspected secondary to cardiogenic component requiring peripheral vasopressor support. 08/15/2023: BP soft, responsive to small volume fluid boluses, Echo with EF 60-65%, will add Midodrine.  Image spine given report of BLE paresthesias at presentation  Interim History / Subjective:  -No significant events noted overnight -Afebrile, weaned down to room air -Levophed turned off earlier this morning ~ has been responsive to small NS boluses in 250 cc increments -Echo came back with  LVEF 60-65% ~ discussed with Cardiology, ok to add Midodrine 5 mg TID -Given report of bilateral LE paraesthesias  on presentation, concern could there be some Neurogenic component ~ will image her spine  Objective   Blood pressure (!) 85/45, pulse 81, temperature 97.9 F (36.6 C), temperature source Oral, resp. rate (!) 26, SpO2 94%.    FiO2 (%):  [2 %] 2 %   Intake/Output Summary (Last 24 hours) at 08/16/2023 1342 Last data filed at 08/16/2023 1300 Gross per 24 hour  Intake 240 ml  Output 1500 ml  Net -1260 ml   There were no vitals filed for this visit.  Examination: General: Adult female, acutely ill, lying in bed, NAD HEENT: MM pink/moist, anicteric, atraumatic, neck supple Neuro: A&O x 4, able to follow commands, PERRL +3, MAE CV: s1s2 RRR, NSR on monitor, no r/m/g Pulm: Regular, non labored on 2 L nasal cannula, breath sounds clear/diminished-BUL & diminished-BLL GI: soft, rounded, non tender, bs x 4 Skin: Limited exam- no rashes/lesions noted Extremities: warm/dry, pulses + 2 R/P, no edema noted  Resolved Hospital Problem list     Assessment & Plan:   #Hypotension: suspect Hypovolemia in setting of multiple antihypertensives +/- ? Neurogenic component (reported BLE paraesthesias)  #HFrEF without acute exacerbation #Query orthostatic hypotension PMHx: NICM, hypertension, CAD, HLD Echocardiogram 08/16/23: LVEF 60-65%, normal diastolic parameters, RV systolic function normal ([facility] Echo from 03/2023: LVEF 50-55%, Grade I DD, RV systolic function normal) -Continuous cardiac monitoring -Maintain MAP >65 -Judicious IV fluids ~ bolus in small increments of 250 cc to assess for fluid responsiveness -Vasopressors as needed to maintain MAP goal -Start Midodrine 5 mg TID -Lactic acid is normalized -HS Troponin is negative x2 -Diuresis as BP and renal function permits ~ holding due to soft BP -Orthostatic blood pressures when able -Cardiology following, appreciate  input -Hold outpatient regimen for now: Valsartan, torsemide, metoprolol, spironolactone  -Continue amiodarone, ASA and Lipitor  #Bilateral Lower Extremity Paraesthesias -Obtain Venous US of BLE -Obtain MRI of Thoracic and Lumbar spine  #COPD without acute exacerbation #OSA -Supplemental O2 PRN to maintain SpO2 > 90% -Continue Incruse Ellipta daily -Bronchodilators PRN  #Type 2 Diabetes Mellitus - Monitor CBG AC, HS - SSI moderate dosing - target range while in ICU: 140-180 - follow ICU hyper/hypo-glycemia protocol  #Hypothyroidism -Continue outpatient Synthroid   Best Practice (right click and "Reselect all SmartList Selections" daily)  Diet/type: Regular consistency (see orders) DVT prophylaxis: prophylactic heparin  GI prophylaxis: PPI Lines: N/A Foley:  N/A Code Status:  full code Last date of multidisciplinary goals of care discussion [08/16/23]  10/25: Pt updated at bedside on plan of care.  Labs   CBC: Recent Labs  Lab 08/15/23 1610 08/16/23 0510  WBC 7.9 5.3  NEUTROABS 6.8  --   HGB 11.2* 9.7*  HCT 36.1 30.5*  MCV 89.1 86.6  PLT 194 168    Basic Metabolic Panel: Recent Labs  Lab 08/15/23 1610 08/16/23 0510  NA 134* 139  K 5.5* 4.3  CL 101 107  CO2 20* 23  GLUCOSE 59* 117*  BUN 64* 52*  CREATININE 2.00* 1.23*  CALCIUM 8.3* 8.4*  MG  --  2.7*  PHOS  --  5.0*   GFR: Estimated Creatinine Clearance: 47.6 mL/min (A) (by C-G formula based on SCr of 1.23 mg/dL (H)). Recent Labs  Lab 08/15/23 1610 08/15/23 1652 08/16/23 0510 08/16/23 0854 08/16/23 1142  PROCALCITON  --   --  1.10  --   --   WBC 7.9  --  5.3  --   --   LATICACIDVEN  --  1.9  --  0.9 1.1    Liver Function Tests: Recent Labs  Lab 08/15/23 1610 08/16/23 0510  AST 125* 161*  ALT 110* 152*  ALKPHOS 174* 143*  BILITOT 1.5* 1.5*  PROT 8.2* 7.2  ALBUMIN 4.1 3.6   No results for input(s): "LIPASE", "AMYLASE" in the last 168 hours. No results for input(s): "AMMONIA" in  the last 168 hours.  ABG    Component Value Date/Time   PHART 7.34 (L) 01/06/2018 0500   PCO2ART 45 01/06/2018 0500   PO2ART 86 01/06/2018 0500   HCO3 24.3 01/06/2018 0500   ACIDBASEDEF 1.6 01/06/2018 0500   O2SAT 95.9 01/06/2018 0500     Coagulation Profile: No results for input(s): "INR", "PROTIME" in the last 168 hours.  Cardiac Enzymes: No results for input(s): "CKTOTAL", "CKMB", "CKMBINDEX", "TROPONINI" in the last 168 hours.  HbA1C: Hemoglobin A1C  Date/Time Value Ref Range Status  07/11/2023 12:00 AM 8.0  Final    Comment:    care everywhere   HB A1C (BAYER DCA - WAIVED)  Date/Time Value Ref Range Status  01/23/2021 03:08 PM 8.6 (H) <7.0 % Final    Comment:                                          Diabetic Adult            <7.0                                       Healthy Adult        4.3 - 5.7                                                           (DCCT/NGSP) American Diabetes Association's Summary of Glycemic Recommendations for Adults with Diabetes: Hemoglobin A1c <7.0%. More stringent glycemic goals (A1c <6.0%) may further reduce complications at the cost of increased risk of hypoglycemia.   10/17/2020 10:21 AM 7.7 (H) <7.0 % Final    Comment:                                          Diabetic Adult            <7.0                                       Healthy Adult        4.3 - 5.7                                                           (DCCT/NGSP) American Diabetes Association's Summary of Glycemic Recommendations for Adults with Diabetes: Hemoglobin A1c <7.0%. More stringent glycemic goals (A1c <6.0%) may further reduce complications at the cost of increased risk of  hypoglycemia.    Hgb A1c MFr Bld  Date/Time Value Ref Range Status  11/29/2022 09:10 AM 9.5 (H) 4.8 - 5.6 % Final    Comment:             Prediabetes: 5.7 - 6.4          Diabetes: >6.4          Glycemic control for adults with diabetes: <7.0   09/17/2022 03:45 PM 8.1 (H) 4.8 -  5.6 % Final    Comment:             Prediabetes: 5.7 - 6.4          Diabetes: >6.4          Glycemic control for adults with diabetes: <7.0     CBG: Recent Labs  Lab 08/15/23 2047 08/16/23 0621 08/16/23 0730 08/16/23 1124  GLUCAP 107* 133* 140* 105*    Review of Systems: Positives in BOLD  Gen: Denies fever, chills, weight change, fatigue, night sweats HEENT: Denies blurred vision, double vision, hearing loss, tinnitus, sinus congestion, rhinorrhea, sore throat, neck stiffness, dysphagia PULM: Denies shortness of breath, cough, sputum production, hemoptysis, wheezing CV: Denies chest pain, edema, orthopnea, paroxysmal nocturnal dyspnea, palpitations GI: Denies abdominal pain, nausea, vomiting, diarrhea, hematochezia, melena, constipation, change in bowel habits GU: Denies dysuria, hematuria, polyuria, oliguria, urethral discharge Endocrine: Denies hot or cold intolerance, polyuria, polyphagia or appetite change Derm: Denies rash, dry skin, scaling or peeling skin change Heme: Denies easy bruising, bleeding, bleeding gums Neuro: Denies headache, numbness, weakness, slurred speech, loss of memory or consciousness  Past Medical History:  She,  has a past medical history of Anxiety, Arthritis, COPD (chronic obstructive pulmonary disease) (HCC), Depression, Diabetes mellitus without complication (HCC), GERD (gastroesophageal reflux disease), Hyperlipidemia, Morbid obesity (HCC), NICM (nonischemic cardiomyopathy) (HCC), Nonobstructive CAD (coronary artery disease), Osteoporosis, Panic attack, PTSD (post-traumatic stress disorder), PVC's (premature ventricular contractions), Rectal fistula, Sleep apnea, and Wears dentures.   Surgical History:   Past Surgical History:  Procedure Laterality Date   CARDIAC CATHETERIZATION     COLONOSCOPY  2014   COLONOSCOPY WITH PROPOFOL N/A 10/01/2016   Procedure: COLONOSCOPY WITH PROPOFOL;  Surgeon: Midge Minium, MD;  Location[facility];   Service: Endoscopy;  Laterality: N/A;   COLONOSCOPY WITH PROPOFOL N/A 01/04/2022   Procedure: COLONOSCOPY WITH PROPOFOL;  Surgeon: Midge Minium, MD;  [facility] END[facility];  Service: Endoscopy;  Laterality: N/A;   CORONARY PRESSURE/FFR WITH 3D MAPPING N/A 09/11/2019   Procedure: Coronary Pressure Wire/FFR w/3D Mapping;  Surgeon: Yvonne Kendall, MD;  Location: [facility] INVASIVE CV LAB;  Service: Cardiovascular;  Laterality: N/A;   CYSTOCELE REPAIR N/A 12/17/2016   Procedure: ANTERIOR REPAIR (CYSTOCELE);  Surgeon: Herold Harms, MD;  Location: [facility] ORS;  Service: Gynecology;  Laterality: N/A;   ESOPHAGOGASTRODUODENOSCOPY (EGD) WITH PROPOFOL N/A 08/11/2019   Procedure: ESOPHAGOGASTRODUODENOSCOPY (EGD) WITH PROPOFOL;  Surgeon: Midge Minium, MD;  Location: [facility] ENDOSCOPY;  Service: Endoscopy;  Laterality: N/A;   POLYPECTOMY  10/01/2016   Procedure: POLYPECTOMY;  Surgeon: Midge Minium, MD;  Location: [facility];  Service: Endoscopy;;   RECTAL SURGERY  06/28/2015   Rectal prolapse, laparoscopic rectopexy the coldCharm[place], MD; [facility]   RIGHT HEART CATH N/A 03/21/2021   Procedure: RIGHT HEART CATH;  Surgeon: Yvonne Kendall, MD;  Location: [facility] INVASIVE CV LAB;  Service: Cardiovascular;  Laterality: N/A;   RIGHT/LEFT HEART CATH AND CORONARY ANGIOGRAPHY Bilateral 09/11/2019   Procedure: RIGHT/LEFT HEART CATH AND CORONARY ANGIOGRAPHY;  Surgeon: Julien Nordmann  J, MD;  Location: [facility] INVASIVE CV LAB;  Service: Cardiovascular;  Laterality: Bilateral;   TOTAL ABDOMINAL HYSTERECTOMY W/ BILATERAL SALPINGOOPHORECTOMY     TUBAL LIGATION     VAGINAL HYSTERECTOMY Bilateral 12/17/2016   Procedure: HYSTERECTOMY VAGINAL WITH BILATERAL SALPINGO OOPHERECTOMY;  Surgeon: Herold Harms, MD;  Location: [facility] ORS;  Service: Gynecology;  Laterality: Bilateral;     Social History:   reports that she quit smoking about 4 years ago. Her smoking use included cigarettes. She started smoking about 54 years  ago. She has a 75 pack-year smoking history. She has never used smokeless tobacco. She reports that she does not drink alcohol and does not use drugs.   Family History:  Her family history includes Cancer in her sister; Diabetes in her brother, brother, mother, sister, and sister; Healthy in her sister; Heart disease in her brother and father; Hypertension in her father; Lymphoma in her brother and brother. There is no history of Breast cancer, Ovarian cancer, or Colon cancer.   Allergies Allergies  Allergen Reactions   Entresto [Sacubitril-Valsartan]     Cough    Lisinopril Cough     Home Medications  Prior to Admission medications   Medication Sig Start Date End Date Taking? Authorizing Provider  amiodarone (PACERONE) 200 MG tablet Take 0.5 tablets (100 mg total) by mouth daily. 08/31/22   Jacklynn Ganong, FNP  aspirin 81 MG tablet Take 81 mg by mouth daily.     [provider]  atorvastatin (LIPITOR) 10 MG tablet TAKE 1 TABLET BY MOUTH EVERY  NIGHT AT BEDTIME 01/07/23   Laurey Morale, MD  clotrimazole-betamethasone (LOTRISONE) cream Apply 1 Application topically daily. 04/23/23   [name], NP  Continuous Blood Gluc Receiver (FREESTYLE LIBRE 2 READER) DEVI 1 each by Does not apply route daily. 09/18/22   [name], NP  Continuous Glucose Sensor (FREESTYLE LIBRE 2 SENSOR) MISC 1 each by Does not apply route every 14 (fourteen) days. 08/14/23   [name], NP  Fluticasone-Umeclidin-Vilant (TRELEGY ELLIPTA) 100-62.5-25 MCG/ACT AEPB Inhale 1 puff into the lungs daily. 07/19/23   Assaker, [place]ali, MD  gabapentin (NEURONTIN) 300 MG capsule Take 1 capsule (300 mg total) by mouth 3 (three) times daily. 01/26/21   [name], NP  glimepiride (AMARYL) 4 MG tablet Take 1 tablet (4 mg total) by mouth daily with breakfast. 08/14/23   [name], NP  Insulin Degludec (TRESIBA FLEXTOUCH [place]) Inject 100 Units into the skin at bedtime.    [provider]  insulin lispro (HUMALOG KWIKPEN) 100 UNIT/ML KwikPen Inject 5 Units into the skin 3 (three) times daily. 09/17/22   [name], NP  Insulin Pen Needle 30G X 6 MM MISC 1 Application by Does not apply route daily. 05/04/22   [name], NP  Lancets [facility] DELICA PLUS [place]) MISC TEST ONCE DAILY 10/04/22   [name], NP  levothyroxine (SYNTHROID) 75 MCG tablet Take 1 tablet (75 mcg total) by mouth daily before breakfast. 08/15/23   [name], NP  metFORMIN (GLUCOPHAGE) 1000 MG tablet TAKE 1 TABLET BY MOUTH TWICE  DAILY WITH MEALS 05/24/23   Mecum, Erin E, PA-C  metoprolol succinate (TOPROL-XL) 50 MG 24 hr tablet TAKE 1 TABLET BY MOUTH DAILY 12/28/22   Laurey Morale, MD  nitroGLYCERIN (NITROSTAT) 0.3 MG SL tablet Place 1 tablet (0.3 mg total) under the tongue every 5 (five) minutes as needed for chest pain. 08/22/22 08/22/23  Jacklynn Ganong, FNP  ONETOUCH ULTRA test strip TEST  ONCE DAILY 09/19/21   [name], NP  pantoprazole (PROTONIX) 40 MG tablet TAKE 1 TABLET BY MOUTH DAILY 03/11/23   [name], NP  polyethylene glycol powder (GLYCOLAX/MIRALAX) 17 GM/SCOOP powder Take 17 g by mouth daily. 07/14/15   [provider]  QUEtiapine (SEROQUEL) 200 MG tablet TAKE 1 TABLET BY MOUTH DAILY AT  BEDTIME 08/09/23   [name], NP  Semaglutide, 1 MG/DOSE, (OZEMPIC, 1 MG/DOSE,) 4 MG/3ML SOPN Inject 1 mg into the skin once a week.    [provider]  spironolactone (ALDACTONE) 25 MG tablet TAKE 1 TABLET BY MOUTH DAILY 06/04/23   Laurey Morale, MD  tiZANidine (ZANAFLEX) 4 MG tablet Take 1 tablet (4 mg total) by mouth every 6 (six) hours as needed for muscle spasms. 08/14/23   [name], NP  torsemide (DEMADEX) 20 MG tablet TAKE 2 TABLETS BY MOUTH DAILY  ALTERNATING WITH TAKE 1 TABLET  DAILY 02/04/23   Bensimhon, Bevelyn Buckles, MD  triamcinolone cream (KENALOG) 0.1 % Apply 1 Application topically 2 (two) times daily.  04/17/23   [name], NP  umeclidinium bromide (INCRUSE ELLIPTA) 62.5 MCG/ACT AEPB Inhale 1 puff into the lungs daily. 07/17/23   Assaker, [place], MD  valsartan (DIOVAN) 40 MG tablet TAKE 1 AND 1/2 TABLETS BY MOUTH  TWICE DAILY 08/12/23   Laurey Morale, MD     Critical care time: 40 minutes    Harlon Ditty, AGACNP-BC [facility] Prefer epic messenger for cross cover needs If after hours, please call E-link

## 2023-08-16 NOTE — Plan of Care (Signed)

## 2023-08-16 NOTE — Progress Notes (Signed)
eLink Physician-Brief Progress Note Patient Name: Kim Buchanan DOB: [birth year] MRN: [id number]   Date of Service  08/16/2023  HPI/Events of Note  Patient with advanced cardiomyopathy with systolic dysfunction (EF 25 - 30 %) admitted with symptomatic hypotension, work up in progress, patient responded to modest IV fluids and Levophed gtt.  eICU Interventions  New Patient Evaluation.        Kim Buchanan 08/16/2023, 6:45 AM

## 2023-08-16 NOTE — Plan of Care (Signed)

## 2023-08-16 NOTE — TOC Initial Note (Signed)
Transition of Care [facility]) - Initial/Assessment Note    Patient Details  Name: Kim Buchanan MRN: [id number] Date of Birth: 08-Oct-1951  Transition of Care [facility]) CM/SW Contact:    Truddie Hidden, RN Phone Number: 08/16/2023, 4:29 PM  Clinical Narrative:     RA completed              Admitted for: CHF Admitted from:Home with spouse Transportation: husband. He will transport her home  Pharmacy:[facility] Current home health/prior home health/DME:[facility] HH: [facility] if needed         Patient Goals and CMS Choice            Expected Discharge Plan and Services                                              Prior Living Arrangements/Services                       Activities of Daily Living      Permission Sought/Granted                  Emotional Assessment              Admission diagnosis:  Cardiogenic shock (HCC) [R57.0] Hypotension [I95.9] CHF (congestive heart failure), NYHA class I, acute on chronic, systolic (HCC) [I50.23] Hypotension, unspecified hypotension type [I95.9] Patient Active Problem List   Diagnosis Date Noted   Hypotension 08/16/2023   Cardiogenic shock (HCC) 08/15/2023   Hypothyroidism 04/17/2023   Tingling in extremities 11/19/2022   Chronic systolic heart failure (HCC) 01/17/2022   History of colonic polyps    Polyp of sigmoid colon    Moderate episode of recurrent major depressive disorder (HCC) 11/24/2021   OSA (obstructive sleep apnea) 09/18/2020   Aortic atherosclerosis (HCC) 06/10/2020   Hepatic steatosis 06/10/2020   HFrEF (heart failure with reduced ejection fraction) (HCC) 06/10/2020   Osteoarthritis of left hip 03/16/2020   Type 2 diabetes mellitus with peripheral neuropathy (HCC) 12/11/2019   NICM (nonischemic cardiomyopathy) (HCC) 09/11/2019   CAD (coronary artery disease), native coronary artery 09/11/2019   Hypertension associated with diabetes (HCC) 06/05/2019   Morbid obesity  (HCC) 10/03/2018   Anemia 10/03/2018   COPD (chronic obstructive pulmonary disease) (HCC) 03/13/2018   Insomnia 11/06/2017   Hyperlipidemia associated with type 2 diabetes mellitus (HCC) 07/19/2017   S/P vaginal hysterectomy 12/17/2016   Vaginal atrophy 10/31/2016   Hx of colonic polyps    DDD (degenerative disc disease), lumbar 02/21/2016   Low back pain 02/14/2016   GAD (generalized anxiety disorder) 06/06/2015   Gastroesophageal reflux disease 06/06/2015   PCP:  [name], NP Pharmacy:   [facility] - [place], [place] - [address] [place] [address] Phone: [phone] Fax: [phone]  [facility] - [place], [place] - [address] [facility] [address] [place] [address] [place] [address] Phone: [phone] Fax: [phone]  [facility] [place], [place] - [address] [place] [address] Phone: [phone] Fax: [phone]  [facility] - [place], [place] - [address] [place] [address] Phone: [phone] Fax: [phone]     Social Determinants of Health (SDOH) Social  History: SDOH Screenings   Food Insecurity: No Food Insecurity (12/10/2022)  Housing: Low Risk  (12/10/2022)  Transportation Needs: No Transportation Needs (12/10/2022)  Utilities: Not At Risk (12/10/2022)  Alcohol Screen: Low Risk  (12/10/2022)  Depression (PHQ2-9): Low Risk  (08/14/2023)  Financial Resource Strain: Low Risk  (12/10/2022)  Recent Concern: Financial Resource Strain - High Risk (10/01/2022)  Physical Activity: Inactive (12/10/2022)  Social Connections: Moderately Isolated (12/10/2022)  Stress: No Stress Concern Present (12/10/2022)  Tobacco Use: Medium Risk (08/15/2023)   SDOH Interventions:     Readmission Risk Interventions    08/16/2023    4:00 PM  Readmission Risk Prevention Plan   Transportation Screening Complete  PCP or Specialist Appt within 3-5 Days Complete  HRI or Home Care Consult Complete  Social Work Consult for Recovery Care Planning/Counseling Complete  Palliative Care Screening Not Applicable  Medication Review Oceanographer) Complete

## 2023-08-16 NOTE — Consult Note (Signed)
Cardiology Consultation   Patient ID: SYERA TEWELL MRN: [id number]; DOB: January 16, 1951  Admit date: 08/15/2023 Date of Consult: 08/16/2023  PCP:  [name], NP   [facility] Providers Cardiologist:  Julien Nordmann, MD  Electrophysiologist:  Lanier Prude, MD  {   Patient Profile:   Kim Buchanan is a 72 y.o. female with a hx of chronic systolic cardiomyopathy, DM2, prior smoking history who is being seen 08/16/2023 for the evaluation of hypotension at the request of Dr. Larinda Buttery.  History of Present Illness:   Ms. Leinonen is followed by [facility] clinic. She has h/o CM since 2020. Echo in 08/2019 showed LVEF 25-35%. LHC in 11/20 and coronary CTA 9/21 showed nonobstructive CAD. She has seen pulmonary and has mild restrictive lung disease and mild COPD. She quit smoking in 2020. Echo in 12/2020 showed LVEF 30-35%, normal RV. RHC 02/2021 showed right >Left heart failure and low cardiac output with low PAPI.   Heart monitor in 05/2021 showed 21% PVCs. Amiodarone was started. cMRI in 9/22 showed ER40%, septal-lateral dyssynchrony, RVEF 45%, LGE in the mid-wall apical lateral apical septal walls. Repeat zio in 10/22 showed 5.8% PVCs.   Echo in 02/2022 showed LVEF 35-40%, mildly decreased RVSF, trivial MR. Echo in 03/2023 showed EF 50-55%, normal RV, mild MR, IVC normal. PFTs showed mild obstruction from emphysema.   Patient was last seen 07/01/23 and digoxin was stopped due to improvement of EF. She is off SGLT2i given yeast infections.   The patient presented to the ER 08/15/23 with hypotension. She reported 2 weeks of leg numbness and tingling. It became worse and she decided to go to the ER. She denies any chest pain, SOB. She said she did almost fall when she was standing up. She had lightheadedness, but did not fall or pass out. No lower leg edema.   In the ER she was hypotensive 81/41, pulse rate 88bpm, RR 20, afebrile, 89%O2. Labs showed Hgb 11.2, sodium 132, K 5.5,  BG 59, CO2 20, Scr 2.0, BUN 64, AST 125, ALT 110, alk phos 174. BNP 29. HS troponin 4. UA with leukocytes. EKG shows NST, LBBB, 83bpm, TWI inf leads. The patient was started on IVF and Levophed.    Past Medical History:  Diagnosis Date   Anxiety    Arthritis    lower back   COPD (chronic obstructive pulmonary disease) (HCC)    Depression    Diabetes mellitus without complication (HCC)    GERD (gastroesophageal reflux disease)    Hyperlipidemia    Morbid obesity (HCC)    NICM (nonischemic cardiomyopathy) (HCC)    a. 03/2019 Echo: Nl LVEF; b. 09/04/2019 Echo: EF 25-30%; c. 09/11/2019 Cath: nonobs LAD/RCA dzs. EF 50-55%; c. 12/2020 Echo: EF 30-35%, glob HK. Mild LVH. Nl RVSP. Mild MR; d. 02/2021 RHC: Elev RH pressures. CO/CI 2.8/1.3; e. 06/2021 cMRI: EF 40%, marked sept-lat dyssynch & septal HK. Small, discrete areas of mid-wall LGE in apical lateral and apical septal walls - ? h/o myocarditis.   Nonobstructive CAD (coronary artery disease)    a. 03/2019 [facility]): EF 63%, ant ischemia; b. 08/2019 Cath: LM nl, LAD 23m, RCA 70p (iFR nl @ 0.97), 50p. EF 50-55%-->Med Rx; c. 06/2020 Cor CTA: Nl FFRs throughout cor tree.   Osteoporosis    Panic attack    PTSD (post-traumatic stress disorder)    PVC's (premature ventricular contractions)    a. 05/2021 Zio: 20.6% PVC burden. 13.4 secs of NSVT-->amio added; b. 06/2021 Zio:  5.8% PVC burden. Rare supraventrciular ectopy.   Rectal fistula    Sleep apnea    a. Prev wore CPAP but lost insurance and hasn't been using since (now has insurance).   Wears dentures    full upper    Past Surgical History:  Procedure Laterality Date   CARDIAC CATHETERIZATION     COLONOSCOPY  2014   COLONOSCOPY WITH PROPOFOL N/A 10/01/2016   Procedure: COLONOSCOPY WITH PROPOFOL;  Surgeon: Midge Minium, MD;  Location: [facility];  Service: Endoscopy;  Laterality: N/A;   COLONOSCOPY WITH PROPOFOL N/A 01/04/2022   Procedure: COLONOSCOPY WITH PROPOFOL;  Surgeon: Midge Minium,  MD;  Location: [facility] ENDOSCOPY;  Service: Endoscopy;  Laterality: N/A;   CORONARY PRESSURE/FFR WITH 3D MAPPING N/A 09/11/2019   Procedure: Coronary Pressure Wire/FFR w/3D Mapping;  Surgeon: Yvonne Kendall, MD;  Location: [facility] INVASIVE CV LAB;  Service: Cardiovascular;  Laterality: N/A;   CYSTOCELE REPAIR N/A 12/17/2016   Procedure: ANTERIOR REPAIR (CYSTOCELE);  Surgeon: Herold Harms, MD;  Location: [facility] ORS;  Service: Gynecology;  Laterality: N/A;   ESOPHAGOGASTRODUODENOSCOPY (EGD) WITH PROPOFOL N/A 08/11/2019   Procedure: ESOPHAGOGASTRODUODENOSCOPY (EGD) WITH PROPOFOL;  Surgeon: Midge Minium, MD;  Location: [facility] ENDOSCOPY;  Service: Endoscopy;  Laterality: N/A;   POLYPECTOMY  10/01/2016   Procedure: POLYPECTOMY;  Surgeon: Midge Minium, MD;  Location: [facility];  Service: Endoscopy;;   RECTAL SURGERY  06/28/2015   Rectal prolapse, laparoscopic rectopexy the coldCharmont, MD; [facility]   RIGHT HEART CATH N/A 03/21/2021   Procedure: RIGHT HEART CATH;  Surgeon: Yvonne Kendall, MD;  Location: [facility] INVASIVE CV LAB;  Service: Cardiovascular;  Laterality: N/A;   RIGHT/LEFT HEART CATH AND CORONARY ANGIOGRAPHY Bilateral 09/11/2019   Procedure: RIGHT/LEFT HEART CATH AND CORONARY ANGIOGRAPHY;  Surgeon: Antonieta Iba, MD;  Location: [facility] INVASIVE CV LAB;  Service: Cardiovascular;  Laterality: Bilateral;   TOTAL ABDOMINAL HYSTERECTOMY W/ BILATERAL SALPINGOOPHORECTOMY     TUBAL LIGATION     VAGINAL HYSTERECTOMY Bilateral 12/17/2016   Procedure: HYSTERECTOMY VAGINAL WITH BILATERAL SALPINGO OOPHERECTOMY;  Surgeon: Herold Harms, MD;  Location: [facility] ORS;  Service: Gynecology;  Laterality: Bilateral;     Home Medications:  Prior to Admission medications   Medication Sig Start Date End Date Taking? Authorizing Provider  amiodarone (PACERONE) 200 MG tablet Take 0.5 tablets (100 mg total) by mouth daily. 08/31/22  Yes Milford, Anderson Malta, FNP  aspirin 81 MG tablet Take 81 mg by mouth  daily.    Yes [provider]  atorvastatin (LIPITOR) 10 MG tablet TAKE 1 TABLET BY MOUTH EVERY  NIGHT AT BEDTIME 01/07/23  Yes Laurey Morale, MD  Fluticasone-Umeclidin-Vilant (TRELEGY ELLIPTA) 100-62.5-25 MCG/ACT AEPB Inhale 1 puff into the lungs daily. 07/19/23  Yes Assaker, [place] Bali, MD  gabapentin (NEURONTIN) 300 MG capsule Take 1 capsule (300 mg total) by mouth 3 (three) times daily. 01/26/21  Yes [name], NP  glimepiride (AMARYL) 4 MG tablet Take 1 tablet (4 mg total) by mouth daily with breakfast. 08/14/23  Yes [name], NP  insulin lispro (HUMALOG KWIKPEN) 100 UNIT/ML KwikPen Inject 5 Units into the skin 3 (three) times daily. 09/17/22  Yes [name], NP  metFORMIN (GLUCOPHAGE) 1000 MG tablet TAKE 1 TABLET BY MOUTH TWICE  DAILY WITH MEALS 05/24/23  Yes Mecum, Erin E, PA-C  metoprolol succinate (TOPROL-XL) 50 MG 24 hr tablet TAKE 1 TABLET BY MOUTH DAILY 12/28/22  Yes Laurey Morale, MD  nitroGLYCERIN (NITROSTAT) 0.3 MG SL tablet Place 1 tablet (0.3 mg total)  under the tongue every 5 (five) minutes as needed for chest pain. 08/22/22 08/22/23 Yes Milford, Anderson Malta, FNP  pantoprazole (PROTONIX) 40 MG tablet TAKE 1 TABLET BY MOUTH DAILY 03/11/23  Yes [name], NP  polyethylene glycol powder (GLYCOLAX/MIRALAX) 17 GM/SCOOP powder Take 17 g by mouth daily. 07/14/15  Yes [provider]  QUEtiapine (SEROQUEL) 200 MG tablet TAKE 1 TABLET BY MOUTH DAILY AT  BEDTIME 08/09/23  Yes [name], NP  Semaglutide, 1 MG/DOSE, (OZEMPIC, 1 MG/DOSE,) 4 MG/3ML SOPN Inject 1 mg into the skin once a week.   Yes [provider]  spironolactone (ALDACTONE) 25 MG tablet TAKE 1 TABLET BY MOUTH DAILY 06/04/23  Yes Laurey Morale, MD  tiZANidine (ZANAFLEX) 4 MG tablet Take 1 tablet (4 mg total) by mouth every 6 (six) hours as needed for muscle spasms. 08/14/23  Yes [name], NP  torsemide (DEMADEX) 20 MG tablet TAKE 2 TABLETS BY MOUTH DAILY   ALTERNATING WITH TAKE 1 TABLET  DAILY 02/04/23  Yes Bensimhon, Bevelyn Buckles, MD  umeclidinium bromide (INCRUSE ELLIPTA) 62.5 MCG/ACT AEPB Inhale 1 puff into the lungs daily. 07/17/23  Yes Assaker, [place], MD  valsartan (DIOVAN) 40 MG tablet TAKE 1 AND 1/2 TABLETS BY MOUTH  TWICE DAILY 08/12/23  Yes Laurey Morale, MD  clotrimazole-betamethasone (LOTRISONE) cream Apply 1 Application topically daily. Patient not taking: Reported on 08/15/2023 04/23/23   [name], NP  Continuous Blood Gluc Receiver (FREESTYLE LIBRE 2 READER) DEVI 1 each by Does not apply route daily. 09/18/22   [name], NP  Continuous Glucose Sensor (FREESTYLE LIBRE 2 SENSOR) MISC 1 each by Does not apply route every 14 (fourteen) days. 08/14/23   [name], NP  Insulin Degludec (TRESIBA FLEXTOUCH [place]) Inject 100 Units into the skin at bedtime.    [provider]  Insulin Pen Needle 30G X 6 MM MISC 1 Application by Does not apply route daily. 05/04/22   [name], NP  Lancets [facility] DELICA PLUS [place]) MISC TEST ONCE DAILY 10/04/22   [name], NP  levothyroxine (SYNTHROID) 75 MCG tablet Take 1 tablet (75 mcg total) by mouth daily before breakfast. 08/15/23   [name], NP  [facility] ULTRA test strip TEST ONCE DAILY 09/19/21   [name], NP  triamcinolone cream (KENALOG) 0.1 % Apply 1 Application topically 2 (two) times daily. Patient not taking: Reported on 08/15/2023 04/17/23   [name], NP    Inpatient Medications: Scheduled Meds:  amiodarone  100 mg Oral Daily   aspirin EC  81 mg Oral Daily   atorvastatin  10 mg Oral QHS   Chlorhexidine Gluconate Cloth  6 each Topical Daily   heparin  5,000 Units Subcutaneous Q8H   insulin aspart  0-15 Units Subcutaneous TID AC & HS   levothyroxine  75 mcg Oral Q0600   pantoprazole  40 mg Oral Daily   QUEtiapine  100 mg Oral QHS   umeclidinium bromide  1 puff Inhalation Daily   Continuous Infusions:   norepinephrine (LEVOPHED) Adult infusion Stopped (08/15/23 2324)   PRN Meds: docusate sodium, ipratropium-albuterol, mouth rinse, polyethylene glycol  Allergies:    Allergies  Allergen Reactions   Entresto [Sacubitril-Valsartan]     Cough    Lisinopril Cough    Social History:   Social History   Socioeconomic History   Marital status: Married    Spouse name: Palmer   Number of children: 1   Years of education: Not on file   Highest education level:  9th grade  Occupational History    Employer: DISABLED    Comment: for panic attacks  Tobacco Use   Smoking status: Former    Current packs/day: 0.00    Average packs/day: 1.5 packs/day for 50.0 years (75.0 ttl pk-yrs)    Types: Cigarettes    Start date: 11/29/1968    Quit date: 11/29/2018    Years since quitting: 4.7   Smokeless tobacco: Never   Tobacco comments:    started at age 56  Vaping Use   Vaping status: Never Used  Substance and Sexual Activity   Alcohol use: No    Alcohol/week: 0.0 standard drinks of alcohol   Drug use: No   Sexual activity: Not Currently  Other Topics Concern   Not on file  Social History Narrative   Pt had one son, who died when was 43 YO in a MVA.   Social Determinants of Health   Financial Resource Strain: Low Risk  (12/10/2022)   Overall Financial Resource Strain (CARDIA)    Difficulty of Paying Living Expenses: Not hard at all  Recent Concern: Financial Resource Strain - High Risk (10/01/2022)   Overall Financial Resource Strain (CARDIA)    Difficulty of Paying Living Expenses: Very hard  Food Insecurity: No Food Insecurity (12/10/2022)   Hunger Vital Sign    Worried About Running Out of Food in the Last Year: Never true    Ran Out of Food in the Last Year: Never true  Transportation Needs: No Transportation Needs (12/10/2022)   PRAPARE - Administrator, Civil Service (Medical): No    Lack of Transportation (Non-Medical): No  Physical Activity: Inactive (12/10/2022)    Exercise Vital Sign    Days of Exercise per Week: 0 days    Minutes of Exercise per Session: 0 min  Stress: No Stress Concern Present (12/10/2022)   [employer] of Occupational Health - Occupational Stress Questionnaire    Feeling of Stress : Not at all  Social Connections: Moderately Isolated (12/10/2022)   Social Connection and Isolation Panel [NHANES]    Frequency of Communication with Friends and Family: Twice a week    Frequency of Social Gatherings with Friends and Family: Once a week    Attends Religious Services: Never    Database administrator or Organizations: No    Attends Banker Meetings: Never    Marital Status: Married  Catering manager Violence: Not At Risk (12/10/2022)   Humiliation, Afraid, Rape, and Kick questionnaire    Fear of Current or Ex-Partner: No    Emotionally Abused: No    Physically Abused: No    Sexually Abused: No    Family History:    Family History  Problem Relation Age of Onset   Diabetes Mother    Diabetes Sister    Cancer Sister    Diabetes Brother    Lymphoma Brother    Heart disease Father    Hypertension Father    Diabetes Sister    Diabetes Brother    Lymphoma Brother    Heart disease Brother    Healthy Sister    Breast cancer Neg Hx    Ovarian cancer Neg Hx    Colon cancer Neg Hx      ROS:  Please see the history of present illness.   All other ROS reviewed and negative.     Physical Exam/Data:   Vitals:   08/16/23 0600 08/16/23 0633 08/16/23 0700 08/16/23 0825  BP: 119/62  (!) 99/52 Marland Kitchen)  89/38  Pulse: 86  81 82  Resp: 16  (!) 21   Temp:  98 F (36.7 C)  97.9 F (36.6 C)  TempSrc:  Oral  Oral  SpO2: 94%  96% 97%    Intake/Output Summary (Last 24 hours) at 08/16/2023 0837 Last data filed at 08/16/2023 0537 Gross per 24 hour  Intake --  Output 950 ml  Net -950 ml      08/14/2023    1:09 PM 07/17/2023   10:17 AM 07/01/2023    2:57 PM  Last 3 Weights  Weight (lbs) 225 lb 3.2 oz 227 lb 226 lb   Weight (kg) 102.15 kg 102.967 kg 102.513 kg     There is no height or weight on file to calculate BMI.  General:  Well nourished, well developed, in no acute distress HEENT: normal Neck: no JVD Vascular: No carotid bruits; Distal pulses 2+ bilaterally Cardiac:  normal S1, S2; RRR; no murmur  Lungs:  diminished at bases  Abd: soft, nontender, no hepatomegaly  Ext: no edema Musculoskeletal:  No deformities, BUE and BLE strength normal and equal Skin: warm and dry  Neuro:  CNs 2-12 intact, no focal abnormalities noted Psych:  Normal affect   EKG:  The EKG was personally reviewed and demonstrates:  NSR 83bpm, LBBB no significant changes Telemetry:  Telemetry was personally reviewed and demonstrates:  Nsr PVCs, HR 80s  Relevant CV Studies:  Echo 08/16/23 1. Left ventricular ejection fraction, by estimation, is 60 to 65%. The  left ventricle has normal function. The left ventricle has no regional  wall motion abnormalities. Left ventricular diastolic parameters were  normal.   2. Right ventricular systolic function is normal. The right ventricular  size is normal.   3. The mitral valve is normal in structure. No evidence of mitral valve  regurgitation.   4. The aortic valve was not well visualized. Aortic valve regurgitation  is not visualized.   Echo 03/2023  1. Left ventricular ejection fraction, by estimation, is 50 to 55%. The  left ventricle has low normal function. The left ventricle has no regional  wall motion abnormalities. Left ventricular diastolic parameters are  consistent with Grade I diastolic  dysfunction (impaired relaxation). The average left ventricular global  longitudinal strain is -14.1 %.   2. Right ventricular systolic function is normal. The right ventricular  size is normal.   3. The mitral valve is normal in structure. Mild mitral valve  regurgitation. No evidence of mitral stenosis.   4. The aortic valve is normal in structure. Aortic valve  regurgitation is  not visualized. No aortic stenosis is present.   5. The inferior vena cava is normal in size with greater than 50%  respiratory variability, suggesting right atrial pressure of 3 mmHg.   Heart monitor 07/2021 HR 67 - 117, average 82 bpm. Rare supraventricular ectopy. PVC's were present, 5.8%. No sustained arrhythmias. Improved PVC ablation.   Sheria Lang T. Lalla Brothers, MD, [facility] Cardiac Electrophysiology  cMRI 06/2021  IMPRESSION: 1. Normal left ventricular size with marked septal-lateral dyssynchrony suggestive of LBBB and septal hypokinesis, EF 40%.   2.  Normal right ventricular size and systolic function, EF 45%.   3. Small discrete areas of mid-wall LGE in the apical lateral and apical septal walls. This is not a coronary disease pattern. Possible prior viral myocarditis.   4. Extracellular fluid volume percentage is in normal range, 26% in the septum.   [place] engineer  By: Marca Ancona M.D.   On: 07/06/2021 17:07   Heart monitor 05/2021 Patch Wear Time:  6 days and 10 hours (2022-08-01T15:33:42-0400 to 2022-08-08T01:54:34-0400)   Patient had a min HR of 66 bpm, max HR of 179 bpm, and avg HR of 93 bpm. Predominant underlying rhythm was Sinus Rhythm. 1 run of Ventricular Tachycardia occurred lasting 13.4 secs with a max rate of 179 bpm (avg 136 bpm). 1 run of Supraventricular  Tachycardia occurred lasting 6 beats with a max rate of 141 bpm (avg 136 bpm). Isolated SVEs were rare (<1.0%), and no SVE Couplets or SVE Triplets were present. Isolated VEs were frequent (20.6%, [id number]), VE Couplets were rare (<1.0%, 2692), and VE  Triplets were rare (<1.0%, 754). Ventricular Bigeminy and Trigeminy were present.    Conclusion: 1. Predominantly NSR.  2. No atrial fibrillation noted.  3. 20.6% PVCs, one predominant type.  4. 13.4 second run NSVT.  5. 6 beat run of possible atrial tachycardia.  RHC 02/2021 Conclusions: Mildly  elevated left heart filling pressure (mean PCWP 20 mmHg). Severely elevated right heart filling pressure (mean RAP 17 mmHg, RVEDP 19 mmHg). Mildly elevated pulmonary artery pressure (mean PAP 28 mmHg) with increased pulmonary vascular resistance (PVR 2.9 WU). Severely reduced Fick cardiac output/index (CO 2.9, CI 1.3).  Low PAPI suggests significant component of right heart failure.   Recommendations: Increase torsemide to 20 mg daily. Decrease metoprolol succinate to 25 mg daily. Refer to advanced heart failure clinic for expedited evaluation.   Yvonne Kendall, MD [facility]  Laboratory Data:  High Sensitivity Troponin:   Recent Labs  Lab 08/15/23 1613 08/15/23 2008  TROPONINIHS 4 4     Chemistry Recent Labs  Lab 08/15/23 1610 08/16/23 0510  NA 134* 139  K 5.5* 4.3  CL 101 107  CO2 20* 23  GLUCOSE 59* 117*  BUN 64* 52*  CREATININE 2.00* 1.23*  CALCIUM 8.3* 8.4*  MG  --  2.7*  GFRNONAA 26* 47*  ANIONGAP 13 9    Recent Labs  Lab 08/15/23 1610 08/16/23 0510  PROT 8.2* 7.2  ALBUMIN 4.1 3.6  AST 125* 161*  ALT 110* 152*  ALKPHOS 174* 143*  BILITOT 1.5* 1.5*   Lipids No results for input(s): "CHOL", "TRIG", "HDL", "LABVLDL", "LDLCALC", "CHOLHDL" in the last 168 hours.  Hematology Recent Labs  Lab 08/15/23 1610 08/16/23 0510  WBC 7.9 5.3  RBC 4.05 3.52*  HGB 11.2* 9.7*  HCT 36.1 30.5*  MCV 89.1 86.6  MCH 27.7 27.6  MCHC 31.0 31.8  RDW 18.2* 18.2*  PLT 194 168   Thyroid  Recent Labs  Lab 08/14/23 1342  TSH 7.470*  FREET4 1.39    BNP Recent Labs  Lab 08/15/23 1613  BNP 29.6    DDimer No results for input(s): "DDIMER" in the last 168 hours.   Radiology/Studies:  CT HEAD WO CONTRAST ( )  Result Date: 08/15/2023 CLINICAL DATA:  Mental status change, unknown cause EXAM: CT HEAD WITHOUT CONTRAST TECHNIQUE: Contiguous axial images were obtained from the base of the skull through the vertex without intravenous contrast. RADIATION DOSE  REDUCTION: This exam was performed according to the departmental dose-optimization program which includes automated exposure control, adjustment of the mA and/or kV according to patient size and/or use of iterative reconstruction technique. COMPARISON:  CT scan head from 11/20/2022. FINDINGS: Brain: No evidence of acute infarction, hemorrhage, hydrocephalus, extra-axial collection or mass lesion/mass effect. There is bilateral periventricular hypodensity, which is non-specific but most likely seen in  the settings of microvascular ischemic changes. Mild in extent. Otherwise normal appearance of brain parenchyma. Ventricles are normal. Cerebral volume is age appropriate. Vascular: No hyperdense vessel or unexpected calcification. Intracranial arteriosclerosis. Skull: Normal. Negative for fracture or focal lesion. Sinuses/Orbits: No acute finding. Other: Visualized mastoid air cells are unremarkable. No mastoid effusion. IMPRESSION: *No acute intracranial abnormality. Electronically Signed   By: Jules Schick M.D.   On: 08/15/2023 17:34   DG Chest 2 View  Result Date: 08/15/2023 CLINICAL DATA:  History of COPD. Bilateral leg numbness. Low oxygen saturation. EXAM: CHEST - 2 VIEW COMPARISON:  01/23/2021. FINDINGS: Bilateral lung fields are clear. Bilateral costophrenic angles are clear. Normal cardio-mediastinal silhouette. No acute osseous abnormalities. The soft tissues are within normal limits. IMPRESSION: No active cardiopulmonary disease. Electronically Signed   By: Jules Schick M.D.   On: 08/15/2023 17:31     Assessment and Plan:   Hypotension - patient presented with lower extremity numbness and tingling found to have hypotension and AKI/dehydration requiring IVF and levophed with improvement of Bps - off LEvo - started on midodrine 5mg  TID - PTA spiro, valsartan, Toprol, torsemide held - BNP 29 and CXR non-acute - LA normal - check orthostatics - kidney function is improving - BP still low,  may need to increase midodrine  Chronic systolic CHF NICM - Echo 2022 showed LVEF 30-35% - cMRI 2022 showed EF40%, septal-lateral dyssynchrony, RV EF 54$, LGE in the mid-wall apical lateral and apical septal walls, possible prior viral myocarditis - Echo 02/2022 35-40%, G1DD - Echo 03/2023 showed LVEF 50-55%, G1DD - Echo this admission showed LVEF 60-65%, no WMA - CM possibly ?PVC mediated - PTA torsemide 40mg  daily alternating with 20mg  daily, Valsartan 40mg  BID, spiro 25mg  daily, Toprol 50mg  daily held for hypotension/AKI - she did not tolerate Entresto due to cough - not on SGLT2i 2/2 years infections - patient appears euvolemic. BNP normal - likely on the dry side. Agree with IVF.  Restart cardiac meds once BP/albs stabilized. May need lower dose of Torsemide at d/c.  CAD - nonobstructive by LHC in 08/2019 - continue Lipitor - continue ASA 81mg  daily - no chest pain reported - HS trop negative x 2  PVCs - heart monitor in 2022 showed PVC 21% - heart monitor 10/222 showed PVC burden 5.8% on amiodarone - continue amiodarone 100mg  daily  For questions or updates, please contact [facility] Please consult [email or web address] for contact info under    Signed, Finnegan Gatta David Stall, PA-C  08/16/2023 8:37 AM

## 2023-08-17 DIAGNOSIS — R202 Paresthesia of skin: Secondary | ICD-10-CM | POA: Diagnosis not present

## 2023-08-17 DIAGNOSIS — I420 Dilated cardiomyopathy: Secondary | ICD-10-CM

## 2023-08-17 DIAGNOSIS — I959 Hypotension, unspecified: Secondary | ICD-10-CM | POA: Diagnosis not present

## 2023-08-17 DIAGNOSIS — E1142 Type 2 diabetes mellitus with diabetic polyneuropathy: Secondary | ICD-10-CM

## 2023-08-17 DIAGNOSIS — I5023 Acute on chronic systolic (congestive) heart failure: Secondary | ICD-10-CM | POA: Diagnosis not present

## 2023-08-17 DIAGNOSIS — R570 Cardiogenic shock: Secondary | ICD-10-CM | POA: Diagnosis not present

## 2023-08-17 LAB — BASIC METABOLIC PANEL
Anion gap: 8 (ref 5–15)
BUN: 29 mg/dL — ABNORMAL HIGH (ref 8–23)
CO2: 23 mmol/L (ref 22–32)
Calcium: 8.6 mg/dL — ABNORMAL LOW (ref 8.9–10.3)
Chloride: 108 mmol/L (ref 98–111)
Creatinine, Ser: 0.9 mg/dL (ref 0.44–1.00)
GFR, Estimated: >60 mL/min (ref >60)
Glucose, Bld: 118 mg/dL — ABNORMAL HIGH (ref 70–99)
Potassium: 4 mmol/L (ref 3.5–5.1)
Sodium: 139 mmol/L (ref 135–145)

## 2023-08-17 LAB — CBC
HCT: 30.7 % — ABNORMAL LOW (ref 36.0–46.0)
Hemoglobin: 9.5 g/dL — ABNORMAL LOW (ref 12.0–15.0)
MCH: 27.1 pg (ref 26.0–34.0)
MCHC: 30.9 g/dL (ref 30.0–36.0)
MCV: 87.5 fL (ref 80.0–100.0)
Platelets: 162 10*3/uL (ref 150–400)
RBC: 3.51 MIL/uL — ABNORMAL LOW (ref 3.87–5.11)
RDW: 18.3 % — ABNORMAL HIGH (ref 11.5–15.5)
WBC: 3.9 10*3/uL — ABNORMAL LOW (ref 4.0–10.5)
nRBC: 0 % (ref 0.0–0.2)

## 2023-08-17 LAB — URINE CULTURE: Culture: <10000 — AB

## 2023-08-17 LAB — GLUCOSE, CAPILLARY: Glucose-Capillary: 129 mg/dL — ABNORMAL HIGH (ref 70–99)

## 2023-08-17 LAB — PHOSPHORUS: Phosphorus: 3.6 mg/dL (ref 2.5–4.6)

## 2023-08-17 LAB — MAGNESIUM: Magnesium: 2.5 mg/dL — ABNORMAL HIGH (ref 1.7–2.4)

## 2023-08-17 MED ORDER — METOPROLOL SUCCINATE ER 50 MG PO TB24: ORAL_TABLET | ORAL | Status: DC

## 2023-08-17 MED ORDER — MIDODRINE HCL 5 MG PO TABS: 5.0000 mg | ORAL_TABLET | Freq: Three times a day (TID) | ORAL | 0 refills | Status: AC

## 2023-08-17 MED ORDER — VALSARTAN 40 MG PO TABS: ORAL_TABLET | ORAL | Status: DC

## 2023-08-17 MED ORDER — TORSEMIDE 20 MG PO TABS: ORAL_TABLET | ORAL | Status: DC

## 2023-08-17 MED ORDER — TRESIBA FLEXTOUCH 200 UNIT/ML ~~LOC~~ SOPN: PEN_INJECTOR | SUBCUTANEOUS | Status: DC

## 2023-08-17 MED ORDER — SPIRONOLACTONE 25 MG PO TABS: ORAL_TABLET | ORAL | Status: DC

## 2023-08-17 NOTE — Discharge Summary (Signed)
Physician Discharge Summary   Kim Buchanan  female DOB: 17-Feb-1951  XBM:[id number]  PCP: [name], NP  Admit date: 08/15/2023 Discharge date: 08/17/2023  Admitted From: home Disposition:  home Brothers updated at bedside prior to discharge. CODE STATUS: Full code  Discharge Instructions     Diet - low sodium heart healthy   Complete by: As directed    Diet Carb Modified   Complete by: As directed    Discharge instructions   Complete by: As directed    You are started on midodrine to increase your blood pressure.  Please hold Toprol, spironolactone, torsemide and valsartan until followup with your cardiologist in about a week due to low blood pressure.  Your blood sugar has been in low 100's without Tresiba in the hospital.  Please reduce Tresiba from 100 units to 70 units and check your blood sugars before breakfast to ensure you don't have low blood sugar. [facility] Course:  For full details, please see H&P, progress notes, consult notes and ancillary notes.  Briefly,  Kim Buchanan is a 72 year old female patient with a past medical history of nonischemic cardiomyopathy, remote hx of sCHF, type 2 diabetes mellitus, morbid obesity who presented to [facility] on 08/15/2023 with bilateral lower extremity weakness numbness and general fatigue. Also described presyncopal events.   On arrival to the ED pt was found to be hypotensive with maps in the 40s.  Pt was given 1 L fluids given her nonischemic cardiomyopathy and was started on norepinephrine and admitted to the ICU for further management.   Her labs were significant for an AKI with a creatinine of 2.0 mg/dL complicated by none anion gap metabolic acidosis. Imaging was unremarkable.  Cardiology consulted.  Pt was transferred to hospitalist service on 08/17/23.  # Hypotension  --in the setting of being on multiple home antihypertensive medications.  Home Metoprolol,  spironolactone, torsemide, valsartan were held, and pt was weaned off pressor after just few hours.  Pt was started on midodrine 5 mg TID.  BP improved to 110's prior to discharge.   --continue to hold home Metoprolol, spironolactone, torsemide, valsartan until f/u with outpatient cardiology a week after discharge.  # AKI  creatinine of 2.0 mg/dL on presentation.  Received IVF.  Improved to 0.9 baseline prior to discharge.  # Nonischemic cardiomyopathy Remote history of depressed ejection fraction 30 to 35% in 2022.  Ejection fraction has normalized this admission. Cardiac medications on hold as above in the setting of hypotension  # Bilateral Lower Extremity Paraesthesias and weakness Neg DVT.  MRI lumbar spine with mild to moderate multilevel disease, nothing severe.  Maybe 2/2 hypotension.  Symptoms much improved prior to discharge.   # Type 2 diabetes mellitus --A1c 8.0 in Sept 2024.  Pt reported taking Tresiba 100u nightly, Humalog 12u TID, glimepiride, metformin and Ozempic at home.  Pt follows with Endocrin with [facility].   --Unexpectedly, BG remained in low 100's during hospitalization while only receiving a couple of units of SSI, but no long-acting insulin.  When asked if pt had hypoglycemic episodes at home, pt said sometimes BG was in 90's.   --pt was discharged with [place] reduced to 70u nightly, and 5u Humalog TID.  Pt was advised to check BG to monitor for hypoglycemia and continue outpatient f/u with endocrin.  # Morbid obesity, BMI 40  #COPD without acute exacerbation #OSA --pt has Rx for both Incruse Ellipta and Trelegy Ellipta.  #  Hypothyroidism -Continue outpatient Synthroid   Unless noted above, medications under "STOP" list are ones pt was not taking PTA.  Discharge Diagnoses:  Principal Problem:   Cardiogenic shock [facility]) Active Problems:   Hypotension   30 Day Unplanned Readmission Risk Score    Flowsheet Row ED to Hosp-Admission (Current) from 08/15/2023 in  [facility]U  30 Day Unplanned Readmission Risk Score (%) 21.25 Filed at 08/17/2023 0800       This score is the patient's risk of an unplanned readmission within 30 days of being discharged (0 -100%). The score is based on dignosis, age, lab data, medications, orders, and past utilization.   Low:  0-14.9   Medium: 15-21.9   High: 22-29.9   Extreme: 30 and above         Discharge Instructions:  Allergies as of 08/17/2023       Reactions   Entresto [sacubitril-valsartan]    Cough   Lisinopril Cough        Medication List     STOP taking these medications    clotrimazole-betamethasone cream Commonly known as: LOTRISONE   triamcinolone cream 0.1 % Commonly known as: KENALOG       TAKE these medications    amiodarone 200 MG tablet Commonly known as: Pacerone Take 0.5 tablets (100 mg total) by mouth daily.   aspirin 81 MG tablet Take 81 mg by mouth daily.   atorvastatin 10 MG tablet Commonly known as: LIPITOR TAKE 1 TABLET BY MOUTH EVERY  NIGHT AT BEDTIME   FreeStyle Libre 2 Reader Devi 1 each by Does not apply route daily.   FreeStyle Libre 2 Sensor Misc 1 each by Does not apply route every 14 (fourteen) days.   gabapentin 300 MG capsule Commonly known as: NEURONTIN Take 1 capsule (300 mg total) by mouth 3 (three) times daily.   glimepiride 4 MG tablet Commonly known as: AMARYL Take 1 tablet (4 mg total) by mouth daily with breakfast.   Incruse Ellipta 62.5 MCG/ACT Aepb Generic drug: umeclidinium bromide Inhale 1 puff into the lungs daily.   insulin lispro 100 UNIT/ML KwikPen Commonly known as: HumaLOG KwikPen Inject 5 Units into the skin 3 (three) times daily.   Insulin Pen Needle 30G X 6 MM Misc 1 Application by Does not apply route daily.   levothyroxine 75 MCG tablet Commonly known as: Synthroid Take 1 tablet (75 mcg total) by mouth daily before breakfast.   metFORMIN 1000 MG tablet Commonly known as:  GLUCOPHAGE TAKE 1 TABLET BY MOUTH TWICE  DAILY WITH MEALS   metoprolol succinate 50 MG 24 hr tablet Commonly known as: TOPROL-XL Hold until outpatient followup with cardiology due to low blood pressure. What changed:  how much to take how to take this when to take this additional instructions   midodrine 5 MG tablet Commonly known as: PROAMATINE Take 1 tablet (5 mg total) by mouth 3 (three) times daily with meals.   nitroGLYCERIN 0.3 MG SL tablet Commonly known as: Nitrostat Place 1 tablet (0.3 mg total) under the tongue every 5 (five) minutes as needed for chest pain.   OneTouch Delica Plus Lancet30G Misc TEST ONCE DAILY   OneTouch Ultra test strip Generic drug: glucose blood TEST ONCE DAILY   Ozempic (1 MG/DOSE) 4 MG/3ML Sopn Generic drug: Semaglutide (1 MG/DOSE) Inject 1 mg into the skin once a week.   pantoprazole 40 MG tablet Commonly known as: PROTONIX TAKE 1 TABLET BY MOUTH DAILY   polyethylene glycol powder 17  GM/SCOOP powder Commonly known as: GLYCOLAX/MIRALAX Take 17 g by mouth daily.   QUEtiapine 200 MG tablet Commonly known as: SEROQUEL TAKE 1 TABLET BY MOUTH DAILY AT  BEDTIME   spironolactone 25 MG tablet Commonly known as: ALDACTONE Hold until outpatient followup with cardiology due to low blood pressure. What changed:  how much to take how to take this when to take this additional instructions   tiZANidine 4 MG tablet Commonly known as: Zanaflex Take 1 tablet (4 mg total) by mouth every 6 (six) hours as needed for muscle spasms.   torsemide 20 MG tablet Commonly known as: DEMADEX Hold until outpatient followup with cardiology due to low blood pressure. What changed: See the new instructions.   Trelegy Ellipta 100-62.5-25 MCG/ACT Aepb Generic drug: Fluticasone-Umeclidin-Vilant Inhale 1 puff into the lungs daily.   Evaristo Bury FlexTouch 200 UNIT/ML FlexTouch Pen Generic drug: insulin degludec Reduce from 100 units to 70 units. What changed:   medication strength how much to take how to take this when to take this additional instructions   valsartan 40 MG tablet Commonly known as: DIOVAN Hold until outpatient followup with cardiology due to low blood pressure. What changed:  how much to take how to take this when to take this additional instructions         Follow-up Information     Gollan, Tollie Pizza, MD Follow up in 1 week(s).   Specialty: Cardiology Contact information: [address] [place] [address] [phone]         Langston Reusing, PA-C Follow up in 2 week(s).   Specialty: Endocrinology Contact information: [address] [place] [address] [phone]                 Allergies  Allergen Reactions   Entresto [Sacubitril-Valsartan]     Cough    Lisinopril Cough     The results of significant diagnostics from this hospitalization (including imaging, microbiology, ancillary and laboratory) are listed below for reference.   Consultations:   Procedures/Studies: MR THORACIC SPINE WO CONTRAST  Result Date: 08/16/2023 CLINICAL DATA:  Worsening bilateral lower extremity weakness EXAM: MRI THORACIC AND LUMBAR SPINE WITHOUT CONTRAST TECHNIQUE: Multiplanar and multiecho pulse sequences of the thoracic and lumbar spine were obtained without intravenous contrast. COMPARISON:  01/16/2007 MRI thoracic spine, no prior MRI of the lumbar spine FINDINGS: MRI THORACIC SPINE FINDINGS Alignment: S-shaped curvature of the thoracolumbar spine, with levocurvature the thoracic spine. No significant listhesis. Preservation of the normal thoracic kyphosis. Vertebrae: No acute fracture, evidence of discitis, or suspicious osseous lesion. Endplate degenerative changes, most prominent at T4-T5, T5-T6, and T10-T11. Cord:  Normal signal and morphology. Paraspinal and other soft tissues: Trace bilateral pleural effusions. Disc levels: Multiple small disc bulges,  most prominently at T2-T3 T4-T5, T5-T6, and T8-T9 through T11-T12. Mild thecal T2-T3: Disc bulge and facet arthropathy. Mild spinal canal stenosis and mild bilateral neural foraminal narrowing. T4-T5: Disc bulge and facet arthropathy with prominent focal epidural fat. Mild thecal sac narrowing and mild bilateral foraminal narrowing. T10-T11: Disc bulge and facet arthropathy. Mild spinal canal stenosis and mild left neural foraminal narrowing. MRI LUMBAR SPINE FINDINGS Segmentation:  5 lumbar type vertebral bodies. Alignment: No listhesis. Preservation of the normal lumbar lordosis. S-shaped curvature of the thoracolumbar spine with dextrocurvature of the lumbar spine. Vertebrae: No acute fracture, evidence of discitis, or suspicious osseous lesion. Endplate degenerative changes, most prominently at L4-L5, eccentric to the right. Conus medullaris and cauda equina: Conus extends to  the L2 level. Conus and cauda equina appear normal. Paraspinal and other soft tissues: No acute finding. Atrophy of the posteroinferior paraspinous musculature. Disc levels: T12-L1: Mild right eccentric disc bulge. Mild facet arthropathy. No spinal canal stenosis or neural foraminal narrowing. L1-L2: Mild disc bulge with small left foraminal protrusion. Mild facet arthropathy. No spinal canal stenosis or neural foraminal narrowing. L2-L3: Minimal disc bulge. Mild facet arthropathy. No spinal canal stenosis or neural foraminal narrowing. L3-L4: Minimal disc bulge, the left aspect of which may contact the exiting left L3 nerve roots. Moderate facet arthropathy. No spinal canal stenosis. Mild left neural foraminal narrowing. L4-L5: Mild-to-moderate disc bulge. Mild-to-moderate facet arthropathy. Narrowing of the lateral recesses. No spinal canal stenosis. Mild left and moderate right neural foraminal narrowing. L5-S1: Mild disc bulge with small central protrusion. Mild facet arthropathy. No spinal canal stenosis or neural foraminal narrowing.  IMPRESSION: 1. T2-T3 mild spinal canal stenosis and mild bilateral neural foraminal narrowing. 2. T4-T5 mild thecal sac narrowing and mild bilateral neural foraminal narrowing. 3. T10-T11 mild spinal canal stenosis and mild left neural foraminal narrowing. 4. L3-L4 mild left neural foraminal narrowing. 5. L4-L5 mild left and moderate right neural foraminal narrowing. Narrowing of the lateral recesses at this level could affect the descending L5 nerve roots. 6. Trace bilateral pleural effusions. 7. No acute fracture, evidence of discitis, or suspicious osseous lesion. Electronically Signed   By: Wiliam Ke M.D.   On: 08/16/2023 19:28   MR LUMBAR SPINE WO CONTRAST  Result Date: 08/16/2023 CLINICAL DATA:  Worsening bilateral lower extremity weakness EXAM: MRI THORACIC AND LUMBAR SPINE WITHOUT CONTRAST TECHNIQUE: Multiplanar and multiecho pulse sequences of the thoracic and lumbar spine were obtained without intravenous contrast. COMPARISON:  01/16/2007 MRI thoracic spine, no prior MRI of the lumbar spine FINDINGS: MRI THORACIC SPINE FINDINGS Alignment: S-shaped curvature of the thoracolumbar spine, with levocurvature the thoracic spine. No significant listhesis. Preservation of the normal thoracic kyphosis. Vertebrae: No acute fracture, evidence of discitis, or suspicious osseous lesion. Endplate degenerative changes, most prominent at T4-T5, T5-T6, and T10-T11. Cord:  Normal signal and morphology. Paraspinal and other soft tissues: Trace bilateral pleural effusions. Disc levels: Multiple small disc bulges, most prominently at T2-T3 T4-T5, T5-T6, and T8-T9 through T11-T12. Mild thecal T2-T3: Disc bulge and facet arthropathy. Mild spinal canal stenosis and mild bilateral neural foraminal narrowing. T4-T5: Disc bulge and facet arthropathy with prominent focal epidural fat. Mild thecal sac narrowing and mild bilateral foraminal narrowing. T10-T11: Disc bulge and facet arthropathy. Mild spinal canal stenosis and  mild left neural foraminal narrowing. MRI LUMBAR SPINE FINDINGS Segmentation:  5 lumbar type vertebral bodies. Alignment: No listhesis. Preservation of the normal lumbar lordosis. S-shaped curvature of the thoracolumbar spine with dextrocurvature of the lumbar spine. Vertebrae: No acute fracture, evidence of discitis, or suspicious osseous lesion. Endplate degenerative changes, most prominently at L4-L5, eccentric to the right. Conus medullaris and cauda equina: Conus extends to the L2 level. Conus and cauda equina appear normal. Paraspinal and other soft tissues: No acute finding. Atrophy of the posteroinferior paraspinous musculature. Disc levels: T12-L1: Mild right eccentric disc bulge. Mild facet arthropathy. No spinal canal stenosis or neural foraminal narrowing. L1-L2: Mild disc bulge with small left foraminal protrusion. Mild facet arthropathy. No spinal canal stenosis or neural foraminal narrowing. L2-L3: Minimal disc bulge. Mild facet arthropathy. No spinal canal stenosis or neural foraminal narrowing. L3-L4: Minimal disc bulge, the left aspect of which may contact the exiting left L3 nerve roots. Moderate facet arthropathy. No spinal  canal stenosis. Mild left neural foraminal narrowing. L4-L5: Mild-to-moderate disc bulge. Mild-to-moderate facet arthropathy. Narrowing of the lateral recesses. No spinal canal stenosis. Mild left and moderate right neural foraminal narrowing. L5-S1: Mild disc bulge with small central protrusion. Mild facet arthropathy. No spinal canal stenosis or neural foraminal narrowing. IMPRESSION: 1. T2-T3 mild spinal canal stenosis and mild bilateral neural foraminal narrowing. 2. T4-T5 mild thecal sac narrowing and mild bilateral neural foraminal narrowing. 3. T10-T11 mild spinal canal stenosis and mild left neural foraminal narrowing. 4. L3-L4 mild left neural foraminal narrowing. 5. L4-L5 mild left and moderate right neural foraminal narrowing. Narrowing of the lateral recesses at  this level could affect the descending L5 nerve roots. 6. Trace bilateral pleural effusions. 7. No acute fracture, evidence of discitis, or suspicious osseous lesion. Electronically Signed   By: Wiliam Ke M.D.   On: 08/16/2023 19:28   US Venous Img Lower Bilateral (DVT)  Result Date: 08/16/2023 CLINICAL DATA:  Lower extremity swelling EXAM: BILATERAL LOWER EXTREMITY VENOUS DOPPLER ULTRASOUND TECHNIQUE: Gray-scale sonography with compression, as well as color and duplex ultrasound, were performed to evaluate the deep venous system(s) from the level of the common femoral vein through the popliteal and proximal calf veins. COMPARISON:  None Available. FINDINGS: VENOUS Normal compressibility of the common femoral, superficial femoral, and popliteal veins, as well as the visualized calf veins. Visualized portions of profunda femoral vein and great saphenous vein unremarkable. No filling defects to suggest DVT on grayscale or color Doppler imaging. Doppler waveforms show normal direction of venous flow, normal respiratory plasticity and response to augmentation. OTHER None. Limitations: none IMPRESSION: Negative. Electronically Signed   By: Helyn Numbers M.D.   On: 08/16/2023 18:47   ECHOCARDIOGRAM COMPLETE  Result Date: 08/16/2023    ECHOCARDIOGRAM REPORT   Patient Name:   NYKERA RUNKEL Lipson Date of Exam: 08/16/2023 Medical Rec #:  [id number]        Height:       62.0 in Accession #:    [id number]       Weight:       225.2 lb Date of Birth:  [birth year]        BSA:          2.011 m Patient Age:    72 years         BP:           99/52 mmHg Patient Gender: F                HR:           81 bpm. Exam Location:  [facility] Procedure: 2D Echo, Color Doppler and Cardiac Doppler Indications:     Shock R57.9  History:         Patient has prior history of Echocardiogram examinations, most                  recent 04/11/2023.  Sonographer:     Cristela Blue Referring Phys:  [id number] END Diagnosing Phys: Debbe Odea  MD IMPRESSIONS  1. Left ventricular ejection fraction, by estimation, is 60 to 65%. The left ventricle has normal function. The left ventricle has no regional wall motion abnormalities. Left ventricular diastolic parameters were normal.  2. Right ventricular systolic function is normal. The right ventricular size is normal.  3. The mitral valve is normal in structure. No evidence of mitral valve regurgitation.  4. The aortic valve was not well visualized. Aortic valve regurgitation is not visualized. FINDINGS  Left  Ventricle: Left ventricular ejection fraction, by estimation, is 60 to 65%. The left ventricle has normal function. The left ventricle has no regional wall motion abnormalities. The left ventricular internal cavity size was normal in size. There is  no left ventricular hypertrophy. Left ventricular diastolic parameters were normal. Right Ventricle: The right ventricular size is normal. No increase in right ventricular wall thickness. Right ventricular systolic function is normal. Left Atrium: Left atrial size was normal in size. Right Atrium: Right atrial size was normal in size. Pericardium: There is no evidence of pericardial effusion. Mitral Valve: The mitral valve is normal in structure. No evidence of mitral valve regurgitation. MV peak gradient, 5.8 mmHg. The mean mitral valve gradient is 3.0 mmHg. Tricuspid Valve: The tricuspid valve is normal in structure. Tricuspid valve regurgitation is trivial. Aortic Valve: The aortic valve was not well visualized. Aortic valve regurgitation is not visualized. Aortic valve mean gradient measures 8.0 mmHg. Aortic valve peak gradient measures 13.1 mmHg. Aortic valve area, by VTI measures 2.18 cm. Pulmonic Valve: The pulmonic valve was not well visualized. Pulmonic valve regurgitation is not visualized. Aorta: The aortic root is normal in size and structure. Venous: The inferior vena cava was not well visualized. IAS/Shunts: No atrial level shunt detected by  color flow Doppler.  LEFT VENTRICLE PLAX 2D LVIDd:         5.00 cm   Diastology LVIDs:         2.15 cm   LV e' medial:    6.85 cm/s LV PW:         0.90 cm   LV E/e' medial:  15.8 LV IVS:        0.90 cm   LV e' lateral:   8.16 cm/s LVOT diam:     2.00 cm   LV E/e' lateral: 13.2 LV SV:         77 LV SV Index:   38 LVOT Area:     3.14 cm  RIGHT VENTRICLE RV Basal diam:  3.30 cm RV Mid diam:    1.70 cm RV S prime:     13.50 cm/s TAPSE (M-mode): 3.2 cm LEFT ATRIUM             Index        RIGHT ATRIUM           Index LA diam:        3.70 cm 1.84 cm/m   RA Area:     11.50 cm LA Vol (A2C):   34.7 ml 17.26 ml/m  RA Volume:   25.00 ml  12.43 ml/m LA Vol (A4C):   33.3 ml 16.56 ml/m LA Biplane Vol: 35.0 ml 17.41 ml/m  AORTIC VALVE AV Area (Vmax):    2.26 cm AV Area (Vmean):   2.41 cm AV Area (VTI):     2.18 cm AV Vmax:           181.00 cm/s AV Vmean:          126.000 cm/s AV VTI:            0.352 m AV Peak Grad:      13.1 mmHg AV Mean Grad:      8.0 mmHg LVOT Vmax:         130.00 cm/s LVOT Vmean:        96.600 cm/s LVOT VTI:          0.244 m LVOT/AV VTI ratio: 0.69  AORTA Ao Root diam: 2.50 cm MITRAL VALVE  TRICUSPID VALVE MV Area (PHT): 3.63 cm     TR Peak grad:   16.2 mmHg MV Area VTI:   2.49 cm     TR Vmax:        201.00 cm/s MV Peak grad:  5.8 mmHg MV Mean grad:  3.0 mmHg     SHUNTS MV Vmax:       1.20 m/s     Systemic VTI:  0.24 m MV Vmean:      80.0 cm/s    Systemic Diam: 2.00 cm MV Decel Time: 209 msec MV E velocity: 108.00 cm/s MV A velocity: 122.00 cm/s MV E/A ratio:  0.89 Debbe Odea MD Electronically signed by Debbe Odea MD Signature Date/Time: 08/16/2023/1:03:55 PM    Final    CT HEAD WO CONTRAST ( )  Result Date: 08/15/2023 CLINICAL DATA:  Mental status change, unknown cause EXAM: CT HEAD WITHOUT CONTRAST TECHNIQUE: Contiguous axial images were obtained from the base of the skull through the vertex without intravenous contrast. RADIATION DOSE REDUCTION: This exam was  performed according to the departmental dose-optimization program which includes automated exposure control, adjustment of the mA and/or kV according to patient size and/or use of iterative reconstruction technique. COMPARISON:  CT scan head from 11/20/2022. FINDINGS: Brain: No evidence of acute infarction, hemorrhage, hydrocephalus, extra-axial collection or mass lesion/mass effect. There is bilateral periventricular hypodensity, which is non-specific but most likely seen in the settings of microvascular ischemic changes. Mild in extent. Otherwise normal appearance of brain parenchyma. Ventricles are normal. Cerebral volume is age appropriate. Vascular: No hyperdense vessel or unexpected calcification. Intracranial arteriosclerosis. Skull: Normal. Negative for fracture or focal lesion. Sinuses/Orbits: No acute finding. Other: Visualized mastoid air cells are unremarkable. No mastoid effusion. IMPRESSION: *No acute intracranial abnormality. Electronically Signed   By: Jules Schick M.D.   On: 08/15/2023 17:34   DG Chest 2 View  Result Date: 08/15/2023 CLINICAL DATA:  History of COPD. Bilateral leg numbness. Low oxygen saturation. EXAM: CHEST - 2 VIEW COMPARISON:  01/23/2021. FINDINGS: Bilateral lung fields are clear. Bilateral costophrenic angles are clear. Normal cardio-mediastinal silhouette. No acute osseous abnormalities. The soft tissues are within normal limits. IMPRESSION: No active cardiopulmonary disease. Electronically Signed   By: Jules Schick M.D.   On: 08/15/2023 17:31      Labs: BNP (last 3 results) Recent Labs    08/22/22 0928 08/15/23 1613  BNP 19.8 29.6   Basic Metabolic Panel: Recent Labs  Lab 08/15/23 1610 08/16/23 0510 08/17/23 0337  NA 134* 139 139  K 5.5* 4.3 4.0  CL 101 107 108  CO2 20* 23 23  GLUCOSE 59* 117* 118*  BUN 64* 52* 29*  CREATININE 2.00* 1.23* 0.90  CALCIUM 8.3* 8.4* 8.6*  MG  --  2.7* 2.5*  PHOS  --  5.0* 3.6   Liver Function Tests: Recent  Labs  Lab 08/15/23 1610 08/16/23 0510  AST 125* 161*  ALT 110* 152*  ALKPHOS 174* 143*  BILITOT 1.5* 1.5*  PROT 8.2* 7.2  ALBUMIN 4.1 3.6   No results for input(s): "LIPASE", "AMYLASE" in the last 168 hours. No results for input(s): "AMMONIA" in the last 168 hours. CBC: Recent Labs  Lab 08/15/23 1610 08/16/23 0510 08/17/23 0337  WBC 7.9 5.3 3.9*  NEUTROABS 6.8  --   --   HGB 11.2* 9.7* 9.5*  HCT 36.1 30.5* 30.7*  MCV 89.1 86.6 87.5  PLT 194 168 162   Cardiac Enzymes: No results for input(s): "CKTOTAL", "CKMB", "CKMBINDEX", "TROPONINI" in  the last 168 hours. BNP: Invalid input(s): "POCBNP" CBG: Recent Labs  Lab 08/16/23 1124 08/16/23 1540 08/16/23 1652 08/16/23 2028 08/17/23 0756  GLUCAP 105* 119* 127* 95 129*   D-Dimer No results for input(s): "DDIMER" in the last 72 hours. Hgb A1c No results for input(s): "HGBA1C" in the last 72 hours. Lipid Profile No results for input(s): "CHOL", "HDL", "LDLCALC", "TRIG", "CHOLHDL", "LDLDIRECT" in the last 72 hours. Thyroid function studies Recent Labs    08/14/23 1342  TSH 7.470*   Anemia work up No results for input(s): "VITAMINB12", "FOLATE", "FERRITIN", "TIBC", "IRON", "RETICCTPCT" in the last 72 hours. Urinalysis    Component Value Date/Time   COLORURINE YELLOW (A) 08/15/2023 2325   APPEARANCEUR HAZY (A) 08/15/2023 2325   APPEARANCEUR Clear 01/10/2023 1322   LABSPEC 1.011 08/15/2023 2325   LABSPEC 1.018 01/28/2013 1516   PHURINE 6.0 08/15/2023 2325   GLUCOSEU NEGATIVE 08/15/2023 2325   GLUCOSEU 50 mg/dL 16/07/9603 5409   HGBUR SMALL (A) 08/15/2023 2325   BILIRUBINUR NEGATIVE 08/15/2023 2325   BILIRUBINUR Negative 01/10/2023 1322   BILIRUBINUR Negative 01/28/2013 1516   KETONESUR NEGATIVE 08/15/2023 2325   PROTEINUR NEGATIVE 08/15/2023 2325   UROBILINOGEN negative (A) 03/07/2017 1426   NITRITE NEGATIVE 08/15/2023 2325   LEUKOCYTESUR LARGE (A) 08/15/2023 2325   LEUKOCYTESUR Trace 01/28/2013 1516    Sepsis Labs Recent Labs  Lab 08/15/23 1610 08/16/23 0510 08/17/23 0337  WBC 7.9 5.3 3.9*   Microbiology Recent Results (from the past 240 hour(s))  Urine Culture     Status: Abnormal   Collection Time: 08/15/23 11:25 PM   Specimen: Urine, Random  Result Value Ref Range Status   Specimen Description   Final    URINE, RANDOM Performed at [facility], [address] [place], [place] [address]    Special Requests   Final    NONE Performed at [facility], [address], [place], [place] [address]    Culture (A)  Final    <10,000 COLONIES/mL INSIGNIFICANT GROWTH Performed at [facility], [address], [place], [place] [address]    Report Status 08/17/2023 FINAL  Final  MRSA Next Gen by PCR, Nasal     Status: None   Collection Time: 08/16/23  6:34 AM   Specimen: Nasal Mucosa; Nasal Swab  Result Value Ref Range Status   MRSA by PCR Next Gen NOT DETECTED NOT DETECTED Final    Comment: (NOTE) The GeneXpert MRSA Assay (FDA approved for NASAL specimens only), is one component of a comprehensive MRSA colonization surveillance program. It is not intended to diagnose MRSA infection nor to guide or monitor treatment for MRSA infections. Test performance is not FDA approved in patients less than 19 years old. Performed at [facility], [address] [place], [place] [address]      Total time spend on discharging this patient, including the last patient exam, discussing the hospital stay, instructions for ongoing care as it relates to all pertinent caregivers, as well as preparing the medical discharge records, prescriptions, and/or referrals as applicable, is 50 minutes.    Darlin Priestly, MD  [facility] 08/17/2023, 11:30 AM

## 2023-08-17 NOTE — Plan of Care (Addendum)
  Problem: Health Behavior/Discharge Planning: Goal: Ability to manage health-related needs Kim Buchanan improve Outcome: Progressing   Problem: Nutrition: Goal: Adequate nutrition Kim Buchanan be maintained Outcome: Progressing   

## 2023-08-17 NOTE — Progress Notes (Signed)
Patient was transferred to 234 with no distress noted. Report given to receiving RN and voiced no questions or concerns. Patient stated that she would call and inform her husband of the move. Will transfer care at this time.

## 2023-08-17 NOTE — Progress Notes (Signed)
Rounding Note    Patient Name: Kim Buchanan Date of Encounter: 08/17/2023  [facility] Cardiologist: Julien Nordmann, MD   Subjective   BPS still soft overnight on midodrine. Hgb trending down 11.2>>9.5. kidney function is back to normal. Orthostatics show a drop from sitting to standing. DVT study negative.  Patient is overall feeling much better. She denies chest pain, SOB, lightheadedness or dizziness.   Inpatient Medications    Scheduled Meds:  amiodarone  100 mg Oral Daily   aspirin EC  81 mg Oral Daily   atorvastatin  10 mg Oral QHS   Chlorhexidine Gluconate Cloth  6 each Topical Daily   heparin  5,000 Units Subcutaneous Q8H   insulin aspart  0-15 Units Subcutaneous TID AC & HS   levothyroxine  75 mcg Oral Q0600   midodrine  5 mg Oral TID WC   pantoprazole  40 mg Oral Daily   QUEtiapine  100 mg Oral QHS   umeclidinium bromide  1 puff Inhalation Daily   Continuous Infusions:  norepinephrine (LEVOPHED) Adult infusion Stopped (08/15/23 2310)   PRN Meds: docusate sodium, ipratropium-albuterol, mouth rinse, polyethylene glycol, traZODone   Vital Signs    Vitals:   08/17/23 0334 08/17/23 0400 08/17/23 0500 08/17/23 0600  BP:  (!) 82/66 (!) 92/45 (!) 109/49  Pulse:  65 73 78  Resp:  19 (!) 22 (!) 22  Temp:  98 F (36.7 C)  98.4 F (36.9 C)  TempSrc:  Oral  Oral  SpO2:  94% 94% 97%  Weight: 104.7 kg     Height:        Intake/Output Summary (Last 24 hours) at 08/17/2023 0729 Last data filed at 08/17/2023 0400 Gross per 24 hour  Intake 1463.44 ml  Output 2450 ml  Net -986.56 ml      08/17/2023    3:34 AM 08/16/2023    7:00 AM 08/14/2023    1:09 PM  Last 3 Weights  Weight (lbs) 230 lb 13.2 oz 230 lb 13.2 oz 225 lb 3.2 oz  Weight (kg) 104.7 kg 104.7 kg 102.15 kg      Telemetry    NSR Hr 70s - Personally Reviewed  ECG    No new - Personally Reviewed  Physical Exam   GEN: No acute distress.   Neck: No JVD Cardiac: RRR, no  murmurs, rubs, or gallops.  Respiratory: Clear to auscultation bilaterally. GI: Soft, nontender, non-distended  MS: No edema; No deformity. Neuro:  Nonfocal  Psych: Normal affect   Labs    High Sensitivity Troponin:   Recent Labs  Lab 08/15/23 1613 08/15/23 2008  TROPONINIHS 4 4     Chemistry Recent Labs  Lab 08/15/23 1610 08/16/23 0510 08/17/23 0337  NA 134* 139 139  K 5.5* 4.3 4.0  CL 101 107 108  CO2 20* 23 23  GLUCOSE 59* 117* 118*  BUN 64* 52* 29*  CREATININE 2.00* 1.23* 0.90  CALCIUM 8.3* 8.4* 8.6*  MG  --  2.7* 2.5*  PROT 8.2* 7.2  --   ALBUMIN 4.1 3.6  --   AST 125* 161*  --   ALT 110* 152*  --   ALKPHOS 174* 143*  --   BILITOT 1.5* 1.5*  --   GFRNONAA 26* 47* >60  ANIONGAP 13 9 8     Lipids No results for input(s): "CHOL", "TRIG", "HDL", "LABVLDL", "LDLCALC", "CHOLHDL" in the last 168 hours.  Hematology Recent Labs  Lab 08/15/23 1610 08/16/23 0510 08/17/23 1610  WBC 7.9 5.3 3.9*  RBC 4.05 3.52* 3.51*  HGB 11.2* 9.7* 9.5*  HCT 36.1 30.5* 30.7*  MCV 89.1 86.6 87.5  MCH 27.7 27.6 27.1  MCHC 31.0 31.8 30.9  RDW 18.2* 18.2* 18.3*  PLT 194 168 162   Thyroid  Recent Labs  Lab 08/14/23 1342  TSH 7.470*  FREET4 1.39    BNP Recent Labs  Lab 08/15/23 1613  BNP 29.6    DDimer No results for input(s): "DDIMER" in the last 168 hours.   Radiology    MR THORACIC SPINE WO CONTRAST  Result Date: 08/16/2023 CLINICAL DATA:  Worsening bilateral lower extremity weakness EXAM: MRI THORACIC AND LUMBAR SPINE WITHOUT CONTRAST TECHNIQUE: Multiplanar and multiecho pulse sequences of the thoracic and lumbar spine were obtained without intravenous contrast. COMPARISON:  01/16/2007 MRI thoracic spine, no prior MRI of the lumbar spine FINDINGS: MRI THORACIC SPINE FINDINGS Alignment: S-shaped curvature of the thoracolumbar spine, with levocurvature the thoracic spine. No significant listhesis. Preservation of the normal thoracic kyphosis. Vertebrae: No acute  fracture, evidence of discitis, or suspicious osseous lesion. Endplate degenerative changes, most prominent at T4-T5, T5-T6, and T10-T11. Cord:  Normal signal and morphology. Paraspinal and other soft tissues: Trace bilateral pleural effusions. Disc levels: Multiple small disc bulges, most prominently at T2-T3 T4-T5, T5-T6, and T8-T9 through T11-T12. Mild thecal T2-T3: Disc bulge and facet arthropathy. Mild spinal canal stenosis and mild bilateral neural foraminal narrowing. T4-T5: Disc bulge and facet arthropathy with prominent focal epidural fat. Mild thecal sac narrowing and mild bilateral foraminal narrowing. T10-T11: Disc bulge and facet arthropathy. Mild spinal canal stenosis and mild left neural foraminal narrowing. MRI LUMBAR SPINE FINDINGS Segmentation:  5 lumbar type vertebral bodies. Alignment: No listhesis. Preservation of the normal lumbar lordosis. S-shaped curvature of the thoracolumbar spine with dextrocurvature of the lumbar spine. Vertebrae: No acute fracture, evidence of discitis, or suspicious osseous lesion. Endplate degenerative changes, most prominently at L4-L5, eccentric to the right. Conus medullaris and cauda equina: Conus extends to the L2 level. Conus and cauda equina appear normal. Paraspinal and other soft tissues: No acute finding. Atrophy of the posteroinferior paraspinous musculature. Disc levels: T12-L1: Mild right eccentric disc bulge. Mild facet arthropathy. No spinal canal stenosis or neural foraminal narrowing. L1-L2: Mild disc bulge with small left foraminal protrusion. Mild facet arthropathy. No spinal canal stenosis or neural foraminal narrowing. L2-L3: Minimal disc bulge. Mild facet arthropathy. No spinal canal stenosis or neural foraminal narrowing. L3-L4: Minimal disc bulge, the left aspect of which may contact the exiting left L3 nerve roots. Moderate facet arthropathy. No spinal canal stenosis. Mild left neural foraminal narrowing. L4-L5: Mild-to-moderate disc bulge.  Mild-to-moderate facet arthropathy. Narrowing of the lateral recesses. No spinal canal stenosis. Mild left and moderate right neural foraminal narrowing. L5-S1: Mild disc bulge with small central protrusion. Mild facet arthropathy. No spinal canal stenosis or neural foraminal narrowing. IMPRESSION: 1. T2-T3 mild spinal canal stenosis and mild bilateral neural foraminal narrowing. 2. T4-T5 mild thecal sac narrowing and mild bilateral neural foraminal narrowing. 3. T10-T11 mild spinal canal stenosis and mild left neural foraminal narrowing. 4. L3-L4 mild left neural foraminal narrowing. 5. L4-L5 mild left and moderate right neural foraminal narrowing. Narrowing of the lateral recesses at this level could affect the descending L5 nerve roots. 6. Trace bilateral pleural effusions. 7. No acute fracture, evidence of discitis, or suspicious osseous lesion. Electronically Signed   By: Wiliam Ke M.D.   On: 08/16/2023 19:28   MR LUMBAR SPINE WO CONTRAST  Result Date: 08/16/2023 CLINICAL DATA:  Worsening bilateral lower extremity weakness EXAM: MRI THORACIC AND LUMBAR SPINE WITHOUT CONTRAST TECHNIQUE: Multiplanar and multiecho pulse sequences of the thoracic and lumbar spine were obtained without intravenous contrast. COMPARISON:  01/16/2007 MRI thoracic spine, no prior MRI of the lumbar spine FINDINGS: MRI THORACIC SPINE FINDINGS Alignment: S-shaped curvature of the thoracolumbar spine, with levocurvature the thoracic spine. No significant listhesis. Preservation of the normal thoracic kyphosis. Vertebrae: No acute fracture, evidence of discitis, or suspicious osseous lesion. Endplate degenerative changes, most prominent at T4-T5, T5-T6, and T10-T11. Cord:  Normal signal and morphology. Paraspinal and other soft tissues: Trace bilateral pleural effusions. Disc levels: Multiple small disc bulges, most prominently at T2-T3 T4-T5, T5-T6, and T8-T9 through T11-T12. Mild thecal T2-T3: Disc bulge and facet arthropathy.  Mild spinal canal stenosis and mild bilateral neural foraminal narrowing. T4-T5: Disc bulge and facet arthropathy with prominent focal epidural fat. Mild thecal sac narrowing and mild bilateral foraminal narrowing. T10-T11: Disc bulge and facet arthropathy. Mild spinal canal stenosis and mild left neural foraminal narrowing. MRI LUMBAR SPINE FINDINGS Segmentation:  5 lumbar type vertebral bodies. Alignment: No listhesis. Preservation of the normal lumbar lordosis. S-shaped curvature of the thoracolumbar spine with dextrocurvature of the lumbar spine. Vertebrae: No acute fracture, evidence of discitis, or suspicious osseous lesion. Endplate degenerative changes, most prominently at L4-L5, eccentric to the right. Conus medullaris and cauda equina: Conus extends to the L2 level. Conus and cauda equina appear normal. Paraspinal and other soft tissues: No acute finding. Atrophy of the posteroinferior paraspinous musculature. Disc levels: T12-L1: Mild right eccentric disc bulge. Mild facet arthropathy. No spinal canal stenosis or neural foraminal narrowing. L1-L2: Mild disc bulge with small left foraminal protrusion. Mild facet arthropathy. No spinal canal stenosis or neural foraminal narrowing. L2-L3: Minimal disc bulge. Mild facet arthropathy. No spinal canal stenosis or neural foraminal narrowing. L3-L4: Minimal disc bulge, the left aspect of which may contact the exiting left L3 nerve roots. Moderate facet arthropathy. No spinal canal stenosis. Mild left neural foraminal narrowing. L4-L5: Mild-to-moderate disc bulge. Mild-to-moderate facet arthropathy. Narrowing of the lateral recesses. No spinal canal stenosis. Mild left and moderate right neural foraminal narrowing. L5-S1: Mild disc bulge with small central protrusion. Mild facet arthropathy. No spinal canal stenosis or neural foraminal narrowing. IMPRESSION: 1. T2-T3 mild spinal canal stenosis and mild bilateral neural foraminal narrowing. 2. T4-T5 mild thecal sac  narrowing and mild bilateral neural foraminal narrowing. 3. T10-T11 mild spinal canal stenosis and mild left neural foraminal narrowing. 4. L3-L4 mild left neural foraminal narrowing. 5. L4-L5 mild left and moderate right neural foraminal narrowing. Narrowing of the lateral recesses at this level could affect the descending L5 nerve roots. 6. Trace bilateral pleural effusions. 7. No acute fracture, evidence of discitis, or suspicious osseous lesion. Electronically Signed   By: Wiliam Ke M.D.   On: 08/16/2023 19:28   US Venous Img Lower Bilateral (DVT)  Result Date: 08/16/2023 CLINICAL DATA:  Lower extremity swelling EXAM: BILATERAL LOWER EXTREMITY VENOUS DOPPLER ULTRASOUND TECHNIQUE: Gray-scale sonography with compression, as well as color and duplex ultrasound, were performed to evaluate the deep venous system(s) from the level of the common femoral vein through the popliteal and proximal calf veins. COMPARISON:  None Available. FINDINGS: VENOUS Normal compressibility of the common femoral, superficial femoral, and popliteal veins, as well as the visualized calf veins. Visualized portions of profunda femoral vein and great saphenous vein unremarkable. No filling defects to suggest DVT on grayscale or color Doppler imaging.  Doppler waveforms show normal direction of venous flow, normal respiratory plasticity and response to augmentation. OTHER None. Limitations: none IMPRESSION: Negative. Electronically Signed   By: Helyn Numbers M.D.   On: 08/16/2023 18:47   ECHOCARDIOGRAM COMPLETE  Result Date: 08/16/2023    ECHOCARDIOGRAM REPORT   Patient Name:   Kim Buchanan Date of Exam: 08/16/2023 Medical Rec #:  [id number]        Height:       62.0 in Accession #:    [id number]       Weight:       225.2 lb Date of Birth:  [birth year]        BSA:          2.011 m Patient Age:    72 years         BP:           99/52 mmHg Patient Gender: F                HR:           81 bpm. Exam Location:  [facility] Procedure: 2D  Echo, Color Doppler and Cardiac Doppler Indications:     Shock R57.9  History:         Patient has prior history of Echocardiogram examinations, most                  recent 04/11/2023.  Sonographer:     Cristela Blue Referring Phys:  [id number] CHRISTOPHER END Diagnosing Phys: Debbe Odea MD IMPRESSIONS  1. Left ventricular ejection fraction, by estimation, is 60 to 65%. The left ventricle has normal function. The left ventricle has no regional wall motion abnormalities. Left ventricular diastolic parameters were normal.  2. Right ventricular systolic function is normal. The right ventricular size is normal.  3. The mitral valve is normal in structure. No evidence of mitral valve regurgitation.  4. The aortic valve was not well visualized. Aortic valve regurgitation is not visualized. FINDINGS  Left Ventricle: Left ventricular ejection fraction, by estimation, is 60 to 65%. The left ventricle has normal function. The left ventricle has no regional wall motion abnormalities. The left ventricular internal cavity size was normal in size. There is  no left ventricular hypertrophy. Left ventricular diastolic parameters were normal. Right Ventricle: The right ventricular size is normal. No increase in right ventricular wall thickness. Right ventricular systolic function is normal. Left Atrium: Left atrial size was normal in size. Right Atrium: Right atrial size was normal in size. Pericardium: There is no evidence of pericardial effusion. Mitral Valve: The mitral valve is normal in structure. No evidence of mitral valve regurgitation. MV peak gradient, 5.8 mmHg. The mean mitral valve gradient is 3.0 mmHg. Tricuspid Valve: The tricuspid valve is normal in structure. Tricuspid valve regurgitation is trivial. Aortic Valve: The aortic valve was not well visualized. Aortic valve regurgitation is not visualized. Aortic valve mean gradient measures 8.0 mmHg. Aortic valve peak gradient measures 13.1 mmHg. Aortic valve area, by VTI  measures 2.18 cm. Pulmonic Valve: The pulmonic valve was not well visualized. Pulmonic valve regurgitation is not visualized. Aorta: The aortic root is normal in size and structure. Venous: The inferior vena cava was not well visualized. IAS/Shunts: No atrial level shunt detected by color flow Doppler.  LEFT VENTRICLE PLAX 2D LVIDd:         5.00 cm   Diastology LVIDs:         2.15 cm   LV e' medial:  6.85 cm/s LV PW:         0.90 cm   LV E/e' medial:  15.8 LV IVS:        0.90 cm   LV e' lateral:   8.16 cm/s LVOT diam:     2.00 cm   LV E/e' lateral: 13.2 LV SV:         77 LV SV Index:   38 LVOT Area:     3.14 cm  RIGHT VENTRICLE RV Basal diam:  3.30 cm RV Mid diam:    1.70 cm RV S prime:     13.50 cm/s TAPSE (M-mode): 3.2 cm LEFT ATRIUM             Index        RIGHT ATRIUM           Index LA diam:        3.70 cm 1.84 cm/m   RA Area:     11.50 cm LA Vol (A2C):   34.7 ml 17.26 ml/m  RA Volume:   25.00 ml  12.43 ml/m LA Vol (A4C):   33.3 ml 16.56 ml/m LA Biplane Vol: 35.0 ml 17.41 ml/m  AORTIC VALVE AV Area (Vmax):    2.26 cm AV Area (Vmean):   2.41 cm AV Area (VTI):     2.18 cm AV Vmax:           181.00 cm/s AV Vmean:          126.000 cm/s AV VTI:            0.352 m AV Peak Grad:      13.1 mmHg AV Mean Grad:      8.0 mmHg LVOT Vmax:         130.00 cm/s LVOT Vmean:        96.600 cm/s LVOT VTI:          0.244 m LVOT/AV VTI ratio: 0.69  AORTA Ao Root diam: 2.50 cm MITRAL VALVE                TRICUSPID VALVE MV Area (PHT): 3.63 cm     TR Peak grad:   16.2 mmHg MV Area VTI:   2.49 cm     TR Vmax:        201.00 cm/s MV Peak grad:  5.8 mmHg MV Mean grad:  3.0 mmHg     SHUNTS MV Vmax:       1.20 m/s     Systemic VTI:  0.24 m MV Vmean:      80.0 cm/s    Systemic Diam: 2.00 cm MV Decel Time: 209 msec MV E velocity: 108.00 cm/s MV A velocity: 122.00 cm/s MV E/A ratio:  0.89 Debbe Odea MD Electronically signed by Debbe Odea MD Signature Date/Time: 08/16/2023/1:03:55 PM    Final    CT HEAD WO  CONTRAST ( )  Result Date: 08/15/2023 CLINICAL DATA:  Mental status change, unknown cause EXAM: CT HEAD WITHOUT CONTRAST TECHNIQUE: Contiguous axial images were obtained from the base of the skull through the vertex without intravenous contrast. RADIATION DOSE REDUCTION: This exam was performed according to the departmental dose-optimization program which includes automated exposure control, adjustment of the mA and/or kV according to patient size and/or use of iterative reconstruction technique. COMPARISON:  CT scan head from 11/20/2022. FINDINGS: Brain: No evidence of acute infarction, hemorrhage, hydrocephalus, extra-axial collection or mass lesion/mass effect. There is bilateral periventricular hypodensity, which is non-specific but most likely seen in the settings  of microvascular ischemic changes. Mild in extent. Otherwise normal appearance of brain parenchyma. Ventricles are normal. Cerebral volume is age appropriate. Vascular: No hyperdense vessel or unexpected calcification. Intracranial arteriosclerosis. Skull: Normal. Negative for fracture or focal lesion. Sinuses/Orbits: No acute finding. Other: Visualized mastoid air cells are unremarkable. No mastoid effusion. IMPRESSION: *No acute intracranial abnormality. Electronically Signed   By: Jules Schick M.D.   On: 08/15/2023 17:34   DG Chest 2 View  Result Date: 08/15/2023 CLINICAL DATA:  History of COPD. Bilateral leg numbness. Low oxygen saturation. EXAM: CHEST - 2 VIEW COMPARISON:  01/23/2021. FINDINGS: Bilateral lung fields are clear. Bilateral costophrenic angles are clear. Normal cardio-mediastinal silhouette. No acute osseous abnormalities. The soft tissues are within normal limits. IMPRESSION: No active cardiopulmonary disease. Electronically Signed   By: Jules Schick M.D.   On: 08/15/2023 17:31    Cardiac Studies   Echo 08/16/23 1. Left ventricular ejection fraction, by estimation, is 60 to 65%. The  left ventricle has normal  function. The left ventricle has no regional  wall motion abnormalities. Left ventricular diastolic parameters were  normal.   2. Right ventricular systolic function is normal. The right ventricular  size is normal.   3. The mitral valve is normal in structure. No evidence of mitral valve  regurgitation.   4. The aortic valve was not well visualized. Aortic valve regurgitation  is not visualized.    Echo 03/2023  1. Left ventricular ejection fraction, by estimation, is 50 to 55%. The  left ventricle has low normal function. The left ventricle has no regional  wall motion abnormalities. Left ventricular diastolic parameters are  consistent with Grade I diastolic  dysfunction (impaired relaxation). The average left ventricular global  longitudinal strain is -14.1 %.   2. Right ventricular systolic function is normal. The right ventricular  size is normal.   3. The mitral valve is normal in structure. Mild mitral valve  regurgitation. No evidence of mitral stenosis.   4. The aortic valve is normal in structure. Aortic valve regurgitation is  not visualized. No aortic stenosis is present.   5. The inferior vena cava is normal in size with greater than 50%  respiratory variability, suggesting right atrial pressure of 3 mmHg.    Heart monitor 07/2021 HR 67 - 117, average 82 bpm. Rare supraventricular ectopy. PVC's were present, 5.8%. No sustained arrhythmias. Improved PVC ablation.   Sheria Lang T. Lalla Brothers, MD, [facility], [facility] Cardiac Electrophysiology   cMRI 06/2021  IMPRESSION: 1. Normal left ventricular size with marked septal-lateral dyssynchrony suggestive of LBBB and septal hypokinesis, EF 40%.   2.  Normal right ventricular size and systolic function, EF 45%.   3. Small discrete areas of mid-wall LGE in the apical lateral and apical septal walls. This is not a coronary disease pattern. Possible prior viral myocarditis.   4. Extracellular fluid volume percentage is in normal  range, 26% in the septum.   Dalton Mclean     Electronically Signed   By: Marca Ancona M.D.   On: 07/06/2021 17:07   Heart monitor 05/2021 Patch Wear Time:  6 days and 10 hours (2022-08-01T15:33:42-0400 to 2022-08-08T01:54:34-0400)   Patient had a min HR of 66 bpm, max HR of 179 bpm, and avg HR of 93 bpm. Predominant underlying rhythm was Sinus Rhythm. 1 run of Ventricular Tachycardia occurred lasting 13.4 secs with a max rate of 179 bpm (avg 136 bpm). 1 run of Supraventricular  Tachycardia occurred lasting 6 beats with a max rate of  141 bpm (avg 136 bpm). Isolated SVEs were rare (<1.0%), and no SVE Couplets or SVE Triplets were present. Isolated VEs were frequent (20.6%, [id number]), VE Couplets were rare (<1.0%, 2692), and VE  Triplets were rare (<1.0%, 754). Ventricular Bigeminy and Trigeminy were present.    Conclusion: 1. Predominantly NSR.  2. No atrial fibrillation noted.  3. 20.6% PVCs, one predominant type.  4. 13.4 second run NSVT.  5. 6 beat run of possible atrial tachycardia.   RHC 02/2021 Conclusions: Mildly elevated left heart filling pressure (mean PCWP 20 mmHg). Severely elevated right heart filling pressure (mean RAP 17 mmHg, RVEDP 19 mmHg). Mildly elevated pulmonary artery pressure (mean PAP 28 mmHg) with increased pulmonary vascular resistance (PVR 2.9 WU). Severely reduced Fick cardiac output/index (CO 2.9, CI 1.3).  Low PAPI suggests significant component of right heart failure.   Recommendations: Increase torsemide to 20 mg daily. Decrease metoprolol succinate to 25 mg daily. Refer to advanced heart failure clinic for expedited evaluation.   Yvonne Kendall, MD [facility]  Patient Profile     72 y.o. female chronic systolic cardiomyopathy, DM2, prior smoking history who is being seen 08/16/2023 for the evaluation of hypotension   Assessment & Plan   Hypotension - patient presented with lower extremity numbness and tingling found to have hypotension  and AKI/dehydration requiring IVF and levophed with improvement of Bps - PTA spiro, valsartan, Toprol, torsemide held - BNP wnl - off LEvo - started on midodrine 5mg  TID - orthostatics show a drop in SBP from sitting to standing (131>116) - kidney function is back to normal - echo showed normal LVEF. - hypotension may be from overmedication and dehydration. - Bps are still soft overnight, hopefully we will not need to increase midodrine.   Chronic systolic CHF NICM - Echo 2022 showed LVEF 30-35% - cMRI 2022 showed EF40%, septal-lateral dyssynchrony, RV EF 54%, LGE in the mid-wall apical lateral and apical septal walls, possible prior viral myocarditis - Echo 02/2022 35-40%, G1DD - Echo 03/2023 showed LVEF 50-55%, G1DD - Echo this admission showed LVEF 60-65%, no WMA - CM possibly ?PVC mediated - PTA torsemide 40mg  daily alternating with 20mg  daily, Valsartan 40mg  BID, spiro 25mg  daily, Toprol 50mg  daily held for hypotension/AKI - she did not tolerate Entresto due to cough - not on SGLT2i 2/2 years infections - patient appears euvolemic. BNP normal - suspect she was dry s/p IvVF now on midodrine.  Low BP limiting re-initiation of heart meds. May need lower dose of Torsemide at d/c given AKI/dehydration.   CAD - nonobstructive by LHC in 08/2019 - continue Lipitor - continue ASA 81mg  daily - no chest pain reported - HS trop negative x 2 - no further ischemic work-up   PVCs - heart monitor in 2022 showed PVC 21% - heart monitor 10/222 showed PVC burden 5.8% on amiodarone - continue amiodarone 100mg  daily  For questions or updates, please contact [facility] Please consult [email or web address] for contact info under        Signed, Tyshauna Finkbiner David Stall, PA-C  08/17/2023, 7:29 AM

## 2023-08-17 NOTE — Consult Note (Signed)
PHARMACY CONSULT NOTE - ELECTROLYTES  Pharmacy Consult for Electrolyte Monitoring and Replacement   Recent Labs: Estimated Creatinine Clearance: 67.3 mL/min (by C-G formula based on SCr of 0.9 mg/dL).  Potassium (mmol/L)  Date Value  08/17/2023 4.0  02/24/2013 4.1   Magnesium (mg/dL)  Date Value  10/24/7251 2.5 (H)   Calcium (mg/dL)  Date Value  66/44/0347 8.6 (L)   Calcium, Total (mg/dL)  Date Value  42/59/5638 8.6   Albumin (g/dL)  Date Value  75/64/3329 3.6  01/15/2023 4.0  02/24/2013 3.6   Phosphorus (mg/dL)  Date Value  51/88/4166 3.6   Sodium (mmol/L)  Date Value  08/17/2023 139  01/15/2023 137  02/24/2013 143   Corrected Ca: 8.72 mg/dL  Assessment  Kim Buchanan is a 72 y.o. female presenting with numbness and worsening lower extremity weakness. She reports multiple near syncopal episodes. PMH significant for T2DM, combined CHF, COPD, HLD, CAD, OSA. Pharmacy has been consulted to monitor and replace electrolytes.  K+ 5.5 on arrival - received Lokelma 10 gm PO x 1 and calcium gluconate 1 gm IV x 1  Diet: PO MIVF: none  Goal of Therapy: Electrolytes WNL  Plan:  No replacement needed. Pharmacy will sign off as patient has transferred from the ICU. Please re-consult if needed.   Thank you for allowing pharmacy to be a part of this patient's care.  Ronnald Ramp, PharmD 08/17/2023 7:48 AM

## 2023-08-17 NOTE — Progress Notes (Signed)
Patient admitted to the unit and placed on telemetry. A skin assessment was completed by myself and [place]. No skin issues noted at this time.

## 2023-08-19 ENCOUNTER — Telehealth: Payer: Self-pay

## 2023-08-19 NOTE — Transitions of Care (Post Inpatient/ED Visit) (Signed)
08/19/2023  Name: Kim Buchanan MRN: [id number] DOB: 1951-03-14  Today's TOC FU Call Status: Today's TOC FU Call Status:: Successful TOC FU Call Completed TOC FU Call Complete Date: 08/19/23 Patient's Name and Date of Birth confirmed.  Transition Care Management Follow-up Telephone Call Date of Discharge: 08/17/23 Discharge Facility: [facility]) Type of Discharge: Inpatient Admission Primary Inpatient Discharge Diagnosis:: Cardiogenic Shock How have you been since you were released from the hospital?: Better Any questions or concerns?: Yes Patient Questions/Concerns:: general concerns due to medication changes Patient Questions/Concerns Addressed:  (reviewed medications and doses)  Items Reviewed: Did you receive and understand the discharge instructions provided?: Yes Medications obtained,verified, and reconciled?: Yes (Medications Reviewed) Any new allergies since your discharge?: No Dietary orders reviewed?: Yes Type of Diet Ordered:: Heart healthy, NAS Do you have support at home?: Yes People in Home: spouse Name of Support/Comfort Primary Source: [place]  Medications Reviewed Today: Medications Reviewed Today     Reviewed by Johnnette Barrios, RN (Registered Nurse) on 08/19/23 at 1206  Med List Status: <None>   Medication Order Taking? Sig Documenting Provider Last Dose Status Informant  amiodarone (PACERONE) 200 MG tablet [id number] Yes Take 0.5 tablets (100 mg total) by mouth daily. [place], Anderson Malta, FNP Taking Active Multiple Informants  aspirin 81 MG tablet [id number] Yes Take 81 mg by mouth daily.  [provider] Taking Active Self, Multiple Informants           Med Note Deretha Emory, [facility]   Fri Jul 19, 2017 12:33 PM)    atorvastatin (LIPITOR) 10 MG tablet [id number] Yes TAKE 1 TABLET BY MOUTH EVERY  NIGHT AT BEDTIME Laurey Morale, MD Taking Active Multiple Informants  Continuous Blood Gluc Receiver (FREESTYLE LIBRE 2  READER) DEVI [id number] Yes 1 each by Does not apply route daily. [name], NP Taking Active Multiple Informants  Continuous Glucose Sensor (FREESTYLE LIBRE 2 SENSOR) [place] [id number] Yes 1 each by Does not apply route every 14 (fourteen) days. [name], NP Taking Active Multiple Informants  Fluticasone-Umeclidin-Vilant (TRELEGY ELLIPTA) 100-62.5-25 MCG/ACT AEPB [id number]  Inhale 1 puff into the lungs daily. Janann Colonel, MD  Active Multiple Informants  gabapentin (NEURONTIN) 300 MG capsule [id number] Yes Take 1 capsule (300 mg total) by mouth 3 (three) times daily. [name], NP Taking Active Self, Multiple Informants  glimepiride (AMARYL) 4 MG tablet [id number] Yes Take 1 tablet (4 mg total) by mouth daily with breakfast. [name], NP Taking Active Multiple Informants  insulin degludec (TRESIBA FLEXTOUCH) 200 UNIT/ML FlexTouch Pen [id number] Yes Reduce from 100 units to 70 units. Darlin Priestly, MD Taking Active   insulin lispro [facility]) 100 UNIT/ML KwikPen [id number] No Inject 5 Units into the skin 3 (three) times daily.  Patient not taking: Reported on 08/19/2023   [name], NP Not Taking Active Multiple Informants           Med Note Littie Deeds, CHERYL A   Tue [name] 04, 2023  2:10 PM) 12 units TID  Insulin Pen Needle 30G X 6 MM MISC [id number] Yes 1 Application by Does not apply route daily. [name], NP Taking Active Multiple Informants           Med Note Lenna Gilford   Fri Apr 12, 2023  9:08 AM) 4 times daily   Lancets [facility] Epimenio [facility] [place]) MISC [id number] Yes TEST ONCE DAILY [name], NP Taking Active Multiple Informants  levothyroxine (SYNTHROID) 75 MCG tablet [id number] Yes Take 1 tablet (  75 mcg total) by mouth daily before breakfast. [name], NP Taking Active Multiple Informants  metFORMIN (GLUCOPHAGE) 1000 MG tablet [id number] Yes TAKE 1 TABLET BY MOUTH TWICE  DAILY WITH MEALS Mecum, Erin E, PA-C Taking  Active Multiple Informants  metoprolol succinate (TOPROL-XL) 50 MG 24 hr tablet [id number] No Hold until outpatient followup with cardiology due to low blood pressure.  Patient not taking: Reported on 08/19/2023   Darlin Priestly, MD Not Taking Active   midodrine (PROAMATINE) 5 MG tablet [id number] Yes Take 1 tablet (5 mg total) by mouth 3 (three) times daily with meals. Darlin Priestly, MD Taking Active   nitroGLYCERIN (NITROSTAT) 0.3 MG SL tablet [id number] Yes Place 1 tablet (0.3 mg total) under the tongue every 5 (five) minutes as needed for chest pain. Jacklynn Ganong, FNP Taking Active Multiple Informants  Koren Bound test strip [id number] Yes TEST ONCE DAILY [name], NP Taking Active Multiple Informants  pantoprazole (PROTONIX) 40 MG tablet [id number] Yes TAKE 1 TABLET BY MOUTH DAILY [name], NP Taking Active Multiple Informants  polyethylene glycol powder (GLYCOLAX/MIRALAX) 17 GM/SCOOP powder [id number] Yes Take 17 g by mouth daily. [provider] Taking Active Self, Multiple Informants  QUEtiapine (SEROQUEL) 200 MG tablet [id number] Yes TAKE 1 TABLET BY MOUTH DAILY AT  BEDTIME [name], NP Taking Active Multiple Informants  Semaglutide, 1 MG/DOSE, (OZEMPIC, 1 MG/DOSE,) 4 MG/3ML SOPN [id number] No Inject 1 mg into the skin once a week.  Patient not taking: Reported on 08/19/2023   [provider] Not Taking Active Multiple Informants           Med Note Littie Deeds, CHERYL A   Sun Apr 14, 2023 11:50 AM) PAP  spironolactone (ALDACTONE) 25 MG tablet [id number] No Hold until outpatient followup with cardiology due to low blood pressure.  Patient not taking: Reported on 08/19/2023   Darlin Priestly, MD Not Taking Active   tiZANidine (ZANAFLEX) 4 MG tablet [id number]  Take 1 tablet (4 mg total) by mouth every 6 (six) hours as needed for muscle spasms. [name], NP  Active Multiple Informants  torsemide (DEMADEX) 20 MG tablet [id number] No Hold until outpatient  followup with cardiology due to low blood pressure.  Patient not taking: Reported on 08/19/2023   Darlin Priestly, MD Not Taking Active   umeclidinium bromide (INCRUSE ELLIPTA) 62.5 MCG/ACT AEPB [id number] Yes Inhale 1 puff into the lungs daily. Janann Colonel, MD Taking Active Multiple Informants  valsartan (DIOVAN) 40 MG tablet [id number] Yes Hold until outpatient followup with cardiology due to low blood pressure. Darlin Priestly, MD Taking Active             Home Care and Equipment/Supplies: Were Home Health Services Ordered?: No Any new equipment or medical supplies ordered?: No  Functional Questionnaire: Do you need assistance with bathing/showering or dressing?: No Do you need assistance with meal preparation?: No Do you need assistance with eating?: No Do you have difficulty maintaining continence: No Do you need assistance with getting out of bed/getting out of a chair/moving?: No Do you have difficulty managing or taking your medications?: No (Multiple med changes)  Follow up appointments reviewed: PCP Follow-up appointment confirmed?: No (She will call PCP for appt) MD Provider Line [phone] Given: No Specialist Hospital Follow-up appointment confirmed?: Yes Follow-Up Specialty Provider:: Cardio, Endocrinology date not listed  on d/c instructiins but she stated she has appts Do you need transportation to your follow-up appointment?: No (Spouse -Pal[place]) Do you understand care options if your condition(s) worsen?: Yes-patient  verbalized understanding  Multiple medication changes, her BG is under control , she has adjusted her Humalog , multiple anti-hypertensive medications on hold . She wants to discuss med changes   Routine follow-up and on-going assessment evaluation and education of disease processes, recommended interventions for both chronic and acute medical conditions , will occur during each weekly visit along with ongoing review of symptoms ,medication reviews  and reconciliation. Any updates , inconsistencies, discrepancies or acute care concerns will be addressed and routed to the correct Practitioner if indicated   Please refer to Care Plan for goals and interventions -Effectiveness of interventions, symptom management and outcomes will be evaluated  weekly during [facility] 30-day Program Outreach calls  . Any necessary  changes and updates to Care Plan will be completed episodically    Reviewed goals for care  Patient provided with Contact information and verbalized understanding with current POC.  Next TOC 08/28/23 10 am   Susa Loffler , BSN, RN Care Management Coordinator [place]   [facility] [email or web address] Direct Dial: [phone]

## 2023-08-20 DIAGNOSIS — E1159 Type 2 diabetes mellitus with other circulatory complications: Secondary | ICD-10-CM | POA: Diagnosis not present

## 2023-08-20 DIAGNOSIS — E1169 Type 2 diabetes mellitus with other specified complication: Secondary | ICD-10-CM | POA: Diagnosis not present

## 2023-08-20 DIAGNOSIS — E785 Hyperlipidemia, unspecified: Secondary | ICD-10-CM | POA: Diagnosis not present

## 2023-08-20 DIAGNOSIS — E1165 Type 2 diabetes mellitus with hyperglycemia: Secondary | ICD-10-CM | POA: Diagnosis not present

## 2023-08-20 DIAGNOSIS — Z794 Long term (current) use of insulin: Secondary | ICD-10-CM | POA: Diagnosis not present

## 2023-08-20 DIAGNOSIS — I152 Hypertension secondary to endocrine disorders: Secondary | ICD-10-CM | POA: Diagnosis not present

## 2023-08-28 ENCOUNTER — Other Ambulatory Visit: Payer: Self-pay

## 2023-08-28 ENCOUNTER — Telehealth: Payer: Self-pay

## 2023-08-28 NOTE — Patient Instructions (Signed)
Visit Information  Thank you for taking time to visit with me today. Please don't hesitate to contact me if I can be of assistance to you before our next scheduled telephone appointment.  Following is a copy of your care plan:   Goals Addressed             This Visit's Progress    COMPLETED: TOC Care Plan       Current Barriers:  Medication management multiple medication changes  Provider appointments follow-up with PCP, Cardiology, Endocrinology  RNCM Clinical Goal(s):  Patient will work with the Care Management team over the next 30 days to address Transition of Care Barriers: Medication Management Provider appointments take all medications exactly as prescribed and will call provider for medication related questions as evidenced by no missed medications, follow-up  attend all scheduled medical appointments: had Cardiology,Endocrinology, as evidenced by no missed appointments   through collaboration with RN Care manager, provider, and care team.   Interventions: Evaluation of current treatment plan related to  self management and patient's adherence to plan as established by provider  Transitions of Care:  New goal. Doctor Visits  - discussed the importance of doctor visits  Patient Goals/Self-Care Activities: Participate in Transition of Care Program/Attend [facility] scheduled calls Take all medications as prescribed Attend all scheduled provider appointments Perform all self care activities independently  Perform IADL's (shopping, preparing meals, housekeeping, managing finances) independently Call provider office for new concerns or questions   Follow Up Plan:  Telephone follow up appointment with care management team member scheduled for:  08/28/23 @ 10:00am         No further calls are planned.  The patient verbalized understanding of instructions, educational materials, and care plan provided today and agreed to receive a mailed copy of patient instructions, educational  materials, and care plan.   Please call the care guide team at [phone] if you need to cancel or reschedule your appointment.   Please call the Suicide and Crisis Lifeline: 988 call the [place] National Suicide Prevention Lifeline: (612)759-0150 or TTY: 719-251-9528 TTY 386-806-5892) to talk to a trained counselor if you are experiencing a Mental Health or Behavioral Health Crisis or need someone to talk to.  Deidre Ala, RN Medical illustrator [facility] [phone]

## 2023-08-28 NOTE — Patient Outreach (Signed)
Care Management  Transitions of Care Program    08/28/2023 Name: Kim Buchanan MRN: [id number] DOB: [birth year]  Subjective: Kim Buchanan is a 72 y.o. year old female who is a primary care patient of [name], NP. The Care Management team Engaged with patient Engaged with patient by telephone to assess and address transitions of care needs.   Consent to Services:  Patient was given information about care management services, agreed to services, and gave verbal consent to participate.   Assessment:           SDOH Interventions    Flowsheet Row Clinical Support from 12/10/2022 in [facility] Office Visit from 11/21/2022 in [facility] Chronic Care Management from 10/01/2022 in [facility] Practice Chronic Care Management from 06/04/2022 in [facility] Chronic Care Management from 03/26/2022 in [facility] Office Visit from 02/21/2022 in [facility]  SDOH Interventions        Food Insecurity Interventions Intervention Not Indicated -- -- -- Intervention Not Indicated --  Housing Interventions Intervention Not Indicated -- -- -- -- --  Transportation Interventions Intervention Not Indicated -- Intervention Not Indicated Intervention Not Indicated Intervention Not Indicated --  Utilities Interventions Intervention Not Indicated -- -- -- -- --  Alcohol Usage Interventions Intervention Not Indicated (Score <7) -- -- -- -- --  Depression Interventions/Treatment  Medication Medication -- -- -- PHQ2-9 Score <4 Follow-up Not Indicated  Financial Strain Interventions Intervention Not Indicated -- Other (Comment)  [PAP] Other (Comment)  [PAP] -- --  Physical Activity Interventions Patient Refused -- -- -- -- --  Stress Interventions Intervention Not Indicated -- -- -- -- --  Social Connections Interventions Intervention Not Indicated -- -- -- -- --         Goals Addressed             This Visit's Progress    COMPLETED: TOC Care Plan       Current Barriers:  Medication management multiple medication changes  Provider appointments follow-up with PCP, Cardiology, Endocrinology  RNCM Clinical Goal(s):  Patient will work with the Care Management team over the next 30 days to address Transition of Care Barriers: Medication Management Provider appointments take all medications exactly as prescribed and will call provider for medication related questions as evidenced by no missed medications, follow-up  attend all scheduled medical appointments: had Cardiology,Endocrinology, as evidenced by no missed appointments   through collaboration with RN Care manager, provider, and care team.   Interventions: Evaluation of current treatment plan related to  self management and patient's adherence to plan as established by provider  Transitions of Care:  New goal. Doctor Visits  - discussed the importance of doctor visits  Patient Goals/Self-Care Activities: Participate in Transition of Care Program/Attend [facility] scheduled calls Take all medications as prescribed Attend all scheduled provider appointments Perform all self care activities independently  Perform IADL's (shopping, preparing meals, housekeeping, managing finances) independently Call provider office for new concerns or questions   Follow Up Plan:  Telephone follow up appointment with care management team member scheduled for:  08/28/23 @ 10:00am         The patient declines further Outreach services. She states things have calmed down for her and she is comfortable with her medication and knows what they are and when to take them. She has follow up appointment with her providers.   Deidre Ala, RN RN Care  Manager [facility] [phone]

## 2023-09-04 ENCOUNTER — Encounter: Payer: Self-pay | Admitting: Cardiology

## 2023-09-04 ENCOUNTER — Ambulatory Visit: Payer: Medicare Other | Attending: Cardiology | Admitting: Cardiology

## 2023-09-04 VITALS — BP 150/80 | HR 72 | Ht 63.0 in | Wt 216.0 lb

## 2023-09-04 DIAGNOSIS — I5020 Unspecified systolic (congestive) heart failure: Secondary | ICD-10-CM

## 2023-09-04 DIAGNOSIS — I493 Ventricular premature depolarization: Secondary | ICD-10-CM

## 2023-09-04 DIAGNOSIS — J410 Simple chronic bronchitis: Secondary | ICD-10-CM

## 2023-09-04 DIAGNOSIS — I959 Hypotension, unspecified: Secondary | ICD-10-CM | POA: Diagnosis not present

## 2023-09-04 DIAGNOSIS — E039 Hypothyroidism, unspecified: Secondary | ICD-10-CM

## 2023-09-04 DIAGNOSIS — E1159 Type 2 diabetes mellitus with other circulatory complications: Secondary | ICD-10-CM | POA: Diagnosis not present

## 2023-09-04 DIAGNOSIS — I428 Other cardiomyopathies: Secondary | ICD-10-CM

## 2023-09-04 DIAGNOSIS — R7989 Other specified abnormal findings of blood chemistry: Secondary | ICD-10-CM

## 2023-09-04 DIAGNOSIS — I152 Hypertension secondary to endocrine disorders: Secondary | ICD-10-CM

## 2023-09-04 DIAGNOSIS — I25118 Atherosclerotic heart disease of native coronary artery with other forms of angina pectoris: Secondary | ICD-10-CM | POA: Diagnosis not present

## 2023-09-04 MED ORDER — AMIODARONE HCL 100 MG PO TABS: 100.0000 mg | ORAL_TABLET | Freq: Every day | ORAL | 3 refills | Status: DC

## 2023-09-04 MED ORDER — LEVOTHYROXINE SODIUM 75 MCG PO TABS: 75.0000 ug | ORAL_TABLET | Freq: Every day | ORAL | 3 refills | Status: DC

## 2023-09-04 MED ORDER — ASPIRIN 81 MG PO TBEC: 81.0000 mg | DELAYED_RELEASE_TABLET | Freq: Every day | ORAL | 11 refills | Status: DC

## 2023-09-04 NOTE — Progress Notes (Signed)
Cardiology Office Note:  .   Date:  09/04/2023  ID:  Kim Buchanan, DOB [birth year], MRN [id number] PCP: [name], NP  [facility] Providers Cardiologist:  Julien Nordmann, MD Electrophysiologist:  Lanier Prude, MD    History of Present Illness: .   Kim Buchanan is a 72 y.o. female with history of nonobstructive coronary artery disease, HFrEF secondary to NICM, PVCs, type 2 diabetes, hyperlipidemia, COPD with prior tobacco use, restrictive lung disease, elevated LFTs, obesity, anxiety, gastroesophageal reflux disease who is following up today after recent hospitalization.   She was originally diagnosed with cardiomyopathy in 08/2020 echocardiogram showed an EF of 25-30%.  Subsequent diagnostic cath 04/2019 showed 70% stenosis in the proximal RCA negative by FFR with nonobstructive disease involving the mid LAD.  Medical therapy was recommended.  At that time EF was 50-55% by ventriculogram.  But subsequent echo in 05/2020 again showed LV dysfunction with an EF of 30-25%.  Diuresed since was limited by AKI.  Echocardiogram from 12/2020 showed a persistent cardiomyopathy with an EF of 30-35% with global hypokinesis.  In the setting of ongoing dyspnea she underwent RHC in 02/2021 which showed elevated right heart pressures and low cardiac output and index of 2.8 and 1.3 respectively.  She was referred to advanced heart failure service and was also noted to have frequent PVCs.  ZIO event monitor did reveal a 20.6% PVC burden leading to her to be placed on amiodarone.  She was referred to EP with repeat monitoring taking in 06/2021 showing improvement in PVC burden of 5.8% with recommendation to continue her amiodarone.  Sleep study in 05/2021 showed no central or OSA.  She underwent cardiac MRI in 06/2021 revealing 40% marked septal and lateral dyssynchrony and septal hypokinesis.  There were also small discrete areas of mid wall LGE in the apical lateral septal walls with a question  history of myocarditis.  Echocardiogram in 02/2022 showed an EF of 35-40%, global hypokinesis, G1 DD, septal dyssynchrony, and trivial mitral regurgitation.  She continued to be followed by advanced heart failure.  Escalation of GDMT was limited by hypotension, dizziness, and off target effects (recurrent yeast infection on SGLT2 inhibitor).  In the setting of abnormal LFTs amiodarone was reduced to 100 mg.  Echo in 03/2023 revealed an EF of 50-55%, normal RV, mild MR, normal IVC.  Improvement in EF likely related to suppression of PVCs.  Most recent echocardiogram completed during recent hospitalization 07/2023 revealed an LVEF of 60 to 65%, no RWMA, and no valvular abnormalities noted   She was admitted to [facility] 08/11/2023 with bilateral lower extremity weakness, numbness, general fatigue.  She also described presyncopal events.  On arrival to the emergency department she was found to be hypotensive and was given 1 L of fluid and eventually was started on norepinephrine and admitted to ICU for further management.  Her labs were significant for AKI with a creatinine of 2 complicated by nonanion gap metabolic acidosis.  Imaging was unremarkable.  Cardiology was consulted.  Repeat echocardiogram was completed.  Home antihypertensive medications were held and she had previously been started on midodrine with blood pressure improvement.  She was to continue to hold her metoprolol, spironolactone, torsemide, and valsartan until hospital follow-up.  Ultrasound was negative for DVT in the bilateral lower extremities.  MRI of the lumbar spine showed mild to moderate multilevel disease.  She was considered stable and was able to be discharged to the facility on 08/17/2023.  She returns to clinic  today accompanied by her husband. She states that she has been doing well since her discharge from the hospital. She was advised on discharge to hold all of her antihypertensive medications but she stated that she was called by a  care coordinator and advised not to take any of her medications. She had been taking midodrine and [place]. She denies any chest pain, shortness of breath, or peripheral edema.   ROS: 10 point review of systems has been reviewed and considered negative except what is listed in the HPI  Studies Reviewed: .       2D echo 08/16/23 1. Left ventricular ejection fraction, by estimation, is 60 to 65%. The  left ventricle has normal function. The left ventricle has no regional  wall motion abnormalities. Left ventricular diastolic parameters were  normal.   2. Right ventricular systolic function is normal. The right ventricular  size is normal.   3. The mitral valve is normal in structure. No evidence of mitral valve  regurgitation.   4. The aortic valve was not well visualized. Aortic valve regurgitation  is not visualized.   2D echo 03/20/2022: 1. Left ventricular ejection fraction, by estimation, is 35 to 40%. The  left ventricle has moderately decreased function. The left ventricle  demonstrates global hypokinesis. Left ventricular diastolic parameters are  consistent with Grade I diastolic  dysfunction (impaired relaxation). There is septal dyssynchrony.   2. Right ventricular systolic function is mildly reduced. The right  ventricular size is normal. Tricuspid regurgitation signal is inadequate  for assessing PA pressure.   3. The mitral valve is grossly normal. Trivial mitral valve  regurgitation. No evidence of mitral stenosis.   4. The aortic valve was not well visualized. Aortic valve regurgitation  is not visualized. No aortic stenosis is present.   Zio patch 06/2021: HR 67 - 117, average 82 bpm. Rare supraventricular ectopy. PVC's were present, 5.8%. No sustained arrhythmias. Improved PVC ablation.   Cardiac MRI 07/06/2021: IMPRESSION: 1. Normal left ventricular size with marked septal-lateral dyssynchrony suggestive of LBBB and septal hypokinesis, EF 40%.   2.  Normal  right ventricular size and systolic function, EF 45%.   3. Small discrete areas of mid-wall LGE in the apical lateral and apical septal walls. This is not a coronary disease pattern. Possible prior viral myocarditis.   4. Extracellular fluid volume percentage is in normal range, 26% in the septum.   Zio patch 05/2021: Patient had a min HR of 66 bpm, max HR of 179 bpm, and avg HR of 93 bpm. Predominant underlying rhythm was Sinus Rhythm. 1 run of Ventricular Tachycardia occurred lasting 13.4 secs with a max rate of 179 bpm (avg 136 bpm). 1 run of Supraventricular  Tachycardia occurred lasting 6 beats with a max rate of 141 bpm (avg 136 bpm). Isolated SVEs were rare (<1.0%), and no SVE Couplets or SVE Triplets were present. Isolated VEs were frequent (20.6%, [id number]), VE Couplets were rare (<1.0%, 2692), and VE  Triplets were rare (<1.0%, 754). Ventricular Bigeminy and Trigeminy were present.    Conclusion: 1. Predominantly NSR.  2. No atrial fibrillation noted.  3. 20.6% PVCs, one predominant type.  4. 13.4 second run NSVT.  5. 6 beat run of possible atrial tachycardia.    RHC 03/21/2021: Conclusions: Mildly elevated left heart filling pressure (mean PCWP 20 mmHg). Severely elevated right heart filling pressure (mean RAP 17 mmHg, RVEDP 19 mmHg). Mildly elevated pulmonary artery pressure (mean PAP 28 mmHg) with increased pulmonary vascular resistance (PVR  2.9 WU). Severely reduced Fick cardiac output/index (CO 2.9, CI 1.3).  Low PAPI suggests significant component of right heart failure.   Recommendations: Increase torsemide to 20 mg daily. Decrease metoprolol succinate to 25 mg daily. Refer to advanced heart failure clinic for expedited evaluation.   2D echo 12/20/2020: 1. Left ventricular ejection fraction, by estimation, is 30 to 35%. The  left ventricle has moderately decreased function. The left ventricle  demonstrates global hypokinesis. There is mild left ventricular   hypertrophy. Left ventricular diastolic  parameters are indeterminate.   2. Right ventricular systolic function is normal. The right ventricular  size is normal. Tricuspid regurgitation signal is inadequate for assessing  PA pressure.   3. The mitral valve is normal in structure. Mild mitral valve  regurgitation. No evidence of mitral stenosis.   2D echo 09/09/2020: 1. Left ventricular ejection fraction, by estimation, is 30 to 35%. The  left ventricle has moderately decreased function. The left ventricle  demonstrates regional wall motion abnormalities (Anterior and anteroseptal  wall hypokinesis). The average left  ventricular global longitudinal strain is -6.8 %. The global longitudinal  strain is abnormal.   2. Right ventricular systolic function is normal. The right ventricular  size is normal. Tricuspid regurgitation signal is inadequate for assessing  PA pressure.   3. The mitral valve is normal in structure. Mild mitral valve  regurgitation.    Coronary CTA 06/30/2020: Aorta:  Normal size.  Mild aortic atherosclerosis.  No dissection.   Aortic Valve:  Trileaflet.  No calcifications.   Coronary Arteries:  Normal coronary origin.  Right dominance.   RCA is a large dominant artery that gives rise to PDA and PLVB. There is mild (25-49%) soft plaque stenosis in the proximal to mid vessel followed by mild (25-49%) mixed plaque stenosis.   Left main is a large artery that gives rise to LAD and LCX arteries.   LAD is a large vessel that gives rise to medium size D1 and D2 and small D3; there is long, soft plaque stenosis in the mid vessel mild (25-49%) followed by moderate (50-69%).   LCX is a non-dominant artery that gives rise to one large OM1 branch. There is no plaque.   Other findings:   Normal pulmonary vein drainage into the left atrium.   Normal let atrial appendage without a thrombus.   Normal size of the pulmonary artery.   IMPRESSION: 1. Coronary calcium  score of 39.3. This was 22 percentile for age and sex matched control.   2. Normal coronary origin with right dominance.   3. Moderate (50-69%) soft plaque stenosis in the mid LAD; mild (25-49%) soft and mixed plaque stenoses in the proximal to mid RCA.   4. Study will be sent for FFR.   FFR 07/01/2020: 1. Left Main: findings 0.99, 0.99   2. LAD: findings 0.99, 0.97 0.92   3. OM: Findings 0.98, 0.88 0.84   4. RCA: findings 0.97, 0.81 0.84   IMPRESSION: FFR not suggestive of significant obstructive disease.   Coronary CTA 06/09/2020:  Unable to be performed secondary to heart rate -Calcium score 2.67 which places the patient in the 49th percentile for age and sex matched control   2D echo 05/30/2020: 1. Left ventricular ejection fraction, by estimation, is 30 to 35%. The  left ventricle has moderately decreased function. Left ventricular  endocardial border not optimally defined to evaluate regional wall motion.  Left ventricular diastolic parameters  are consistent with Grade II diastolic dysfunction (pseudonormalization).  2. Right ventricular systolic function was not well visualized. The right  ventricular size is normal. Tricuspid regurgitation signal is inadequate  for assessing PA pressure.   3. The mitral valve is normal in structure. Mild mitral valve  regurgitation. No evidence of mitral stenosis.   4. The aortic valve was not well visualized. Aortic valve regurgitation  is not visualized. No aortic stenosis is present.   5. The inferior vena cava is normal in size with greater than 50%  respiratory variability, suggesting right atrial pressure of 3 mmHg.   6. Challenging image quality.   2D echo 08/2019: 1. Left ventricular ejection fraction, by visual estimation, is 25 to  30%. The left ventricle has moderate to severely decreased function. There  is no left ventricular hypertrophy.   2. Left ventricular diastolic parameters are consistent with Grade I   diastolic dysfunction (impaired relaxation).   3. The left ventricle demonstrates global hypokinesis.   4. Global right ventricle has normal systolic function.The right  ventricular size is normal. No increase in right ventricular wall  thickness.   5. Left atrial size was normal.   6. TR signal is inadequate for assessing pulmonary artery systolic  Pressure.   R/LHC 08/2019: Coronary angiography:  Coronary dominance: Right  Left mainstem:   Large vessel that bifurcates into the LAD and left circumflex, no significant disease noted  Left anterior descending (LAD):   Large vessel that extends to the apical region, diagonal branch 2 of moderate size, mild proximal LAD disease estimated at 30%  Left circumflex (LCx):  Large vessel with OM branch 2, no significant disease noted  Right coronary artery (RCA):  Right dominant vessel with PL and PDA, 70% proximal RCA disease followed by sequential 50% lesion proximal to mid vessel  Left ventriculography: Left ventricular systolic function is normal, LVEF is estimated at 50 to 55%, there is no significant mitral regurgitation , no significant aortic valve stenosis  Right heart pressures RA 10 RV 29/8, 11 PA 27/16 mean 20 Wedge 12 Cardiac output 5.04 Cardiac index 2.51  Final Conclusions:   Single-vessel disease RCA Discussed with interventional cardiology, they will perform FFR to determine if intervention needed Right heart pressures are normal   Recommendations:  We will defer coronary intervention to interventional cardiology Needs weight loss, management of COPD Management of hyperlipidemia   iFR 08/2019:   Conclusions: Single vessel coronary with sequential 70% and 50% proximal and mid RCA stenoses that are not hemodynamically significant (iFR = 0.97).  See Dr. Windell Hummingbird diagnostic catheterization report for further details of right/left heart catheterization.   Recommendations: Medical therapy and risk factor  modification to prevent progression of disease. Risk Assessment/Calculations:         Physical Exam:   VS:  BP (!) 150/80 (BP Location: Right Arm, Patient Position: Sitting, Cuff Size: Normal)   Pulse 72   Ht 5\' 3"  (1.6 m)   Wt 216 lb (98 kg)   SpO2 97%   BMI 38.26 kg/m    Wt Readings from Last 3 Encounters:  09/04/23 216 lb (98 kg)  08/17/23 230 lb 13.2 oz (104.7 kg)  08/14/23 225 lb 3.2 oz (102.2 kg)    GEN: Well nourished, well developed in no acute distress NECK: No JVD; No carotid bruits CARDIAC: RRR, no murmurs, rubs, gallops RESPIRATORY:  Clear to auscultation without rales, wheezing or rhonchi  ABDOMEN: Soft, non-tender, non-distended EXTREMITIES:  No edema; No deformity   ASSESSMENT AND PLAN: .   HFrEF secondary to  NICM.  LVEF on echocardiogram 08/16/2023 with 60 to 65%, no RWMA, and no valvular abnormalities noted.  She is euvolemic.  NYHA class I symptoms.  She continues to be followed by advanced heart failure service.  She was recently hospitalized and all of her advanced heart failure medications had to be stopped due to AKI and severe dehydration.  She was also hypotensive with septic shock and ended up being on pressors and started on midodrine.  She has been sent for follow-up labs today to see if her AKI has improved.  Nonobstructive coronary artery disease with no symptoms suggestive of angina.  She was restarted on aspirin and atorvastatin today.  Frequent PVCs present on most recent cardiac monitor at 5.8%.  Recommended to restart amiodarone 100 mg daily today.  Hyperlipidemia with an LDL of 19/2024.  She was restarted on atorvastatin 10 mg daily today  History of hypertension with recent hypotension.  Pressure during hospitalization required IV fluids and pressors.  Metoprolol, spironolactone, torsemide, valsartan remain on hold.  She was started on midodrine 5 mg 3 times daily and has been on midodrine at that time.  Her midodrine today has not been  de-escalated.  She has been encouraged to continue to monitor blood pressures at home 1 to 2 hours postmedication administration as well.  If her blood pressure continues to remain elevated will slowly start to reintroduce her medications back to her daily regimen.  Hypothyroidism where after recent hospitalization she had stopped her levothyroxine.  She has been restarted on levothyroxine with a follow-up TSH and free T4 in 6 weeks.  COPD which remained stable  Chronically elevated LFTs that are mild.  Elevation predates amiodarone use.  Prior viral hepatitis workup was negative.  Continues to follow with GI       Dispo: Patient to return to clinic to see MD/APP in 4 weeks or sooner if needed for reevaluation of blood pressure and medication changes.  Signed, Anaston Koehn, NP

## 2023-09-04 NOTE — Patient Instructions (Signed)
Medication Instructions:  Your physician has recommended you make the following change in your medication:  START: aspirin EC 81 MG tablet - Take 1 tablet (81 mg total) by mouth daily.  amiodarone (PACERONE) 100 MG tablet - Take 1 tablet (100 mg total) by mouth daily  levothyroxine (SYNTHROID) 75 MCG tablet - Take 1 tablet (75 mcg total) by mouth daily before breakfast  midodrine (PROAMATINE) 5 MG tablet Take 1 tablet (5mg ) by mouth twice daily (10 mg total) today and tomorrow (09/04/23 & 09/05/23)  Day 3 (09/06/23) take 1 tablet (5 mg total) by mouth in the AM for 3 days (15th, 16th, & 17th)  If blood pressure remains above 100/60 on day 7 stop it  *If you need a refill on your cardiac medications before your next appointment, please call your pharmacy*  Lab Work: Your provider would like for you to have following labs drawn today CBC & CMET.   If you have labs (blood work) drawn today and your tests are completely normal, you will receive your results only by: MyChart Message (if you have MyChart) OR A paper copy in the mail If you have any lab test that is abnormal or we need to change your treatment, we will call you to review the results.  Testing/Procedures: - None ordered  Follow-Up: At [facility], you and your health needs are our priority.  As part of our continuing mission to provide you with exceptional heart care, we have created designated Provider Care Teams.  These Care Teams include your primary Cardiologist (physician) and Advanced Practice Providers (APPs -  Physician Assistants and Nurse Practitioners) who all work together to provide you with the care you need, when you need it.  Your next appointment:   4 week(s)  Provider:   Charlsie Quest, NP    Other Instructions - None

## 2023-09-05 ENCOUNTER — Other Ambulatory Visit: Payer: Self-pay | Admitting: Nurse Practitioner

## 2023-09-05 ENCOUNTER — Other Ambulatory Visit (HOSPITAL_COMMUNITY): Payer: Self-pay | Admitting: Cardiology

## 2023-09-05 LAB — CBC
Hematocrit: 40.4 % (ref 34.0–46.6)
Hemoglobin: 12.5 g/dL (ref 11.1–15.9)
MCH: 27.3 pg (ref 26.6–33.0)
MCHC: 30.9 g/dL — ABNORMAL LOW (ref 31.5–35.7)
MCV: 88 fL (ref 79–97)
Platelets: 219 10*3/uL (ref 150–450)
RBC: 4.58 x10E6/uL (ref 3.77–5.28)
RDW: 16.6 % — ABNORMAL HIGH (ref 11.7–15.4)
WBC: 6.2 10*3/uL (ref 3.4–10.8)

## 2023-09-05 LAB — COMPREHENSIVE METABOLIC PANEL
ALT: 99 [IU]/L — ABNORMAL HIGH (ref 0–32)
AST: 83 [IU]/L — ABNORMAL HIGH (ref 0–40)
Albumin: 4.2 g/dL (ref 3.9–4.9)
Alkaline Phosphatase: 236 [IU]/L — ABNORMAL HIGH (ref 44–121)
BUN/Creatinine Ratio: 12 (ref 12–28)
BUN: 10 mg/dL (ref 8–27)
Bilirubin Total: 1 mg/dL (ref 0.0–1.2)
CO2: 20 mmol/L (ref 20–29)
Calcium: 9.3 mg/dL (ref 8.7–10.3)
Chloride: 105 mmol/L (ref 96–106)
Creatinine, Ser: 0.85 mg/dL (ref 0.57–1.00)
Globulin, Total: 3 g/dL (ref 1.5–4.5)
Glucose: 109 mg/dL — ABNORMAL HIGH (ref 70–99)
Potassium: 4.4 mmol/L (ref 3.5–5.2)
Sodium: 141 mmol/L (ref 134–144)
Total Protein: 7.2 g/dL (ref 6.0–8.5)
eGFR: 74 mL/min/{1.73_m2} (ref >59)

## 2023-09-05 NOTE — Progress Notes (Signed)
Kidney function has returned to baseline. Liver function continues to improve. Blood counts have improved. Electrolytes are stable. Continue with current medication regimen without changes at this time.

## 2023-09-06 NOTE — Telephone Encounter (Signed)
Requested Prescriptions  Pending Prescriptions Disp Refills   Lancets (ONETOUCH DELICA PLUS LANCET30G) MISC [Pharmacy Med Name: OneTouch Delica Plus Lancet30G] 100 each 2    Sig: TEST ONCE DAILY     Endocrinology: Diabetes - Testing Supplies Passed - 09/05/2023 10:36 PM      Passed - Valid encounter within last 12 months    Recent Outpatient Visits           3 weeks ago Hypothyroidism, unspecified type   [place] [facility] [name], NP   4 months ago Hypothyroidism, unspecified type   [place] [facility] [name], NP   6 months ago Hypertension associated with diabetes [facility])   [place] [facility] [name], NP   7 months ago Encounter for screening mammogram for malignant neoplasm of breast   [place] [facility] [name], NP   7 months ago Right lower quadrant abdominal pain   [facility] [facility] [name], NP       Future Appointments             In 1 month Hammock, Lavonna Rua, NP [facility] [place]   In 5 months [name], NP [place] [facility], [facility]

## 2023-09-16 ENCOUNTER — Other Ambulatory Visit (HOSPITAL_COMMUNITY): Payer: Self-pay | Admitting: Family Medicine

## 2023-09-20 ENCOUNTER — Other Ambulatory Visit: Payer: Self-pay | Admitting: Nurse Practitioner

## 2023-09-23 ENCOUNTER — Other Ambulatory Visit (HOSPITAL_COMMUNITY): Payer: Self-pay | Admitting: Cardiology

## 2023-09-23 DIAGNOSIS — E785 Hyperlipidemia, unspecified: Secondary | ICD-10-CM

## 2023-09-24 ENCOUNTER — Other Ambulatory Visit: Payer: Self-pay | Admitting: Acute Care

## 2023-09-24 ENCOUNTER — Other Ambulatory Visit (HOSPITAL_COMMUNITY): Payer: Self-pay | Admitting: Family Medicine

## 2023-09-24 ENCOUNTER — Other Ambulatory Visit: Payer: Medicare Other

## 2023-09-24 DIAGNOSIS — Z122 Encounter for screening for malignant neoplasm of respiratory organs: Secondary | ICD-10-CM

## 2023-09-24 DIAGNOSIS — Z87891 Personal history of nicotine dependence: Secondary | ICD-10-CM

## 2023-09-24 DIAGNOSIS — E039 Hypothyroidism, unspecified: Secondary | ICD-10-CM | POA: Diagnosis not present

## 2023-09-24 NOTE — Telephone Encounter (Signed)
Requested Prescriptions  Pending Prescriptions Disp Refills   HUMALOG KWIKPEN 100 UNIT/ML KwikPen [Pharmacy Med Name: HumaLOG KwikPen Subcutaneous Solution Pen-injector 100 UNIT/ML] 30 mL PRN    Sig: DIAL AND INJECT 5 UNITS UNDER THE SKIN THREE TIMES A DAY.     Endocrinology:  Diabetes - Insulins Failed - 09/20/2023  8:02 AM      Failed - HBA1C is between 0 and 7.9 and within 180 days    Hemoglobin A1C  Date Value Ref Range Status  07/11/2023 8.0  Final    Comment:    care everywhere   A1c  Date Value Ref Range Status  12/29/2022 7.8%  Final    Comment:    Abstracted by HIM         Passed - Valid encounter within last 6 months    Recent Outpatient Visits           1 month ago Hypothyroidism, unspecified type   [place] [facility] [name], NP   5 months ago Hypothyroidism, unspecified type   [place] [facility] [name], NP   6 months ago Hypertension associated with diabetes [facility])   [place] [facility] [name], NP   7 months ago Encounter for screening mammogram for malignant neoplasm of breast   [place] [facility] [name], NP   8 months ago Right lower quadrant abdominal pain   [place] [facility] [name], NP       Future Appointments             In 1 week Charlsie Quest, NP [facility] [facility] [place]   In 4 months [name], NP [place] [facility], [facility]

## 2023-09-25 LAB — TSH: TSH: 5.97 u[IU]/mL — ABNORMAL HIGH (ref 0.450–4.500)

## 2023-09-25 LAB — T4, FREE: Free T4: 1.47 ng/dL (ref 0.82–1.77)

## 2023-10-01 ENCOUNTER — Other Ambulatory Visit (HOSPITAL_COMMUNITY): Payer: Self-pay | Admitting: Family Medicine

## 2023-10-02 ENCOUNTER — Other Ambulatory Visit: Payer: Self-pay

## 2023-10-02 ENCOUNTER — Other Ambulatory Visit (HOSPITAL_COMMUNITY): Payer: Self-pay | Admitting: Family Medicine

## 2023-10-02 ENCOUNTER — Encounter: Payer: Medicare Other | Admitting: Cardiology

## 2023-10-02 MED ORDER — AMIODARONE HCL 100 MG PO TABS: 100.0000 mg | ORAL_TABLET | Freq: Every day | ORAL | 3 refills | Status: DC

## 2023-10-04 ENCOUNTER — Other Ambulatory Visit: Payer: Self-pay

## 2023-10-04 MED ORDER — AMIODARONE HCL 200 MG PO TABS: 100.0000 mg | ORAL_TABLET | Freq: Every day | ORAL | 3 refills | Status: DC

## 2023-10-07 ENCOUNTER — Encounter: Payer: Self-pay | Admitting: Cardiology

## 2023-10-07 ENCOUNTER — Ambulatory Visit: Payer: Medicare Other | Attending: Cardiology | Admitting: Cardiology

## 2023-10-07 VITALS — BP 146/82 | HR 82 | Ht 63.0 in | Wt 213.2 lb

## 2023-10-07 DIAGNOSIS — E039 Hypothyroidism, unspecified: Secondary | ICD-10-CM | POA: Diagnosis not present

## 2023-10-07 DIAGNOSIS — R7989 Other specified abnormal findings of blood chemistry: Secondary | ICD-10-CM | POA: Diagnosis not present

## 2023-10-07 DIAGNOSIS — I493 Ventricular premature depolarization: Secondary | ICD-10-CM

## 2023-10-07 DIAGNOSIS — I25118 Atherosclerotic heart disease of native coronary artery with other forms of angina pectoris: Secondary | ICD-10-CM | POA: Diagnosis not present

## 2023-10-07 DIAGNOSIS — I5020 Unspecified systolic (congestive) heart failure: Secondary | ICD-10-CM

## 2023-10-07 DIAGNOSIS — I428 Other cardiomyopathies: Secondary | ICD-10-CM | POA: Diagnosis not present

## 2023-10-07 DIAGNOSIS — E1159 Type 2 diabetes mellitus with other circulatory complications: Secondary | ICD-10-CM

## 2023-10-07 DIAGNOSIS — I152 Hypertension secondary to endocrine disorders: Secondary | ICD-10-CM

## 2023-10-07 DIAGNOSIS — J410 Simple chronic bronchitis: Secondary | ICD-10-CM | POA: Diagnosis not present

## 2023-10-07 MED ORDER — METOPROLOL SUCCINATE ER 25 MG PO TB24: ORAL_TABLET | ORAL | 3 refills | Status: DC

## 2023-10-07 NOTE — Patient Instructions (Signed)
Medication Instructions:   START: metoprolol succinate (TOPROL-XL) 25 MG 24 hr tablet - TAKE 1 TABLET BY MOUTH DAILY  *If you need a refill on your cardiac medications before your next appointment, please call your pharmacy*  Lab Work: - None ordered  Testing/Procedures: - None ordered  Follow-Up: At [facility], you and your health needs are our priority.  As part of our continuing mission to provide you with exceptional heart care, we have created designated Provider Care Teams.  These Care Teams include your primary Cardiologist (physician) and Advanced Practice Providers (APPs -  Physician Assistants and Nurse Practitioners) who all work together to provide you with the care you need, when you need it.  Your next appointment:   2 month(s)  Provider:   Charlsie Quest, NP    Other Instructions **When taking BP at home if the top number (systolic) is < (less than) 100 do not take metoprolol succinate (TOPROL-XL) 25 MG 24 hr tablet**

## 2023-10-07 NOTE — Progress Notes (Signed)
Cardiology Office Note:  .   Date:  10/07/2023  ID:  Kim Buchanan, DOB 1951-05-26, MRN [id number] PCP: [name], NP  [facility] Providers Cardiologist:  Julien Nordmann, MD Electrophysiologist:  Lanier Prude, MD    History of Present Illness: .   Kim Buchanan is a 72 y.o. female with a past medical history of nonobstructive coronary disease, HFrEF secondary to NICM, PVCs, type 2 diabetes, hyperlipidemia, COPD with prior tobacco elevated LFTs, obesity, gastroesophageal reflux disease, who is here today for follow-up.   She was originally diagnosed with cardiomyopathy in 08/2020 echocardiogram showed an EF of 25-30%.  Subsequent diagnostic cath 04/2019 showed 70% stenosis in the proximal RCA negative by FFR with nonobstructive disease involving the mid LAD.  Medical therapy was recommended.  At that time EF was 50-55% by ventriculogram.  But subsequent echo in 05/2020 again showed LV dysfunction with an EF of 30-25%.  Diuresed since was limited by AKI.  Echocardiogram from 12/2020 showed a persistent cardiomyopathy with an EF of 30-35% with global hypokinesis.  In the setting of ongoing dyspnea she underwent RHC in 02/2021 which showed elevated right heart pressures and low cardiac output and index of 2.8 and 1.3 respectively.  She was referred to advanced heart failure service and was also noted to have frequent PVCs.  ZIO event monitor did reveal a 20.6% PVC burden leading to her to be placed on amiodarone.  She was referred to EP with repeat monitoring taking in 06/2021 showing improvement in PVC burden of 5.8% with recommendation to continue her amiodarone.  Sleep study in 05/2021 showed no central or OSA.  She underwent cardiac MRI in 06/2021 revealing 40% marked septal and lateral dyssynchrony and septal hypokinesis.  There were also small discrete areas of mid wall LGE in the apical lateral septal walls with a question history of myocarditis.  Echocardiogram in 02/2022 showed  an EF of 35-40%, global hypokinesis, G1 DD, septal dyssynchrony, and trivial mitral regurgitation.  She continued to be followed by advanced heart failure.  Escalation of GDMT was limited by hypotension, dizziness, and off target effects (recurrent yeast infection on SGLT2 inhibitor).  In the setting of abnormal LFTs amiodarone was reduced to 100 mg.  Echo in 03/2023 revealed an EF of 50-55%, normal RV, mild MR, normal IVC.  Improvement in EF likely related to suppression of PVCs.  Most recent echocardiogram completed during recent hospitalization 07/2023 revealed an LVEF of 60 to 65%, no RWMA, and no valvular abnormalities noted   She was admitted to [facility] 08/11/2023 with bilateral lower extremity weakness, numbness, general fatigue.  She also described presyncopal events.  On arrival to the emergency department she was found to be hypotensive and was given 1 L of fluid and eventually was started on norepinephrine and admitted to ICU for further management.  Her labs were significant for AKI with a creatinine of 2 complicated by nonanion gap metabolic acidosis.  Imaging was unremarkable.  Cardiology was consulted.  Repeat echocardiogram was completed.  Home antihypertensive medications were held and she had previously been started on midodrine with blood pressure improvement.  She was to continue to hold her metoprolol, spironolactone, torsemide, and valsartan until hospital follow-up.  Ultrasound was negative for DVT in the bilateral lower extremities.  MRI of the lumbar spine showed mild to moderate multilevel disease.  She was considered stable and was able to be discharged to the facility on 08/17/2023.   She was last seen in clinic on 07/05/2023  accompanied by her husband.  She states that overall she is doing well from a cardiac perspective after hospital discharge.  She states she has been advised on discharge to hold all of her antihypertensive medications.  Her previous management had been de-escalated  and was slightly reintroduced her antihypertensive medications as needed.  She returns to clinic today accompanied by her husband. She states that she has been doing well since her last visit. She denies any chest pain or worsening shortness of breath, peripheral edema, or palpitations.  She states that she has done well without any medications.  She is no longer on Ozempic.  States her blood sugars have been running higher than normal.  At her last visit she was weaned off of her midodrine.  Blood pressure is slightly elevated today.  Denies any recent hospitalizations or visits to the emergency  ROS: 10 point review of system has been reviewed and considered negative except what is been listed in the HPI  Studies Reviewed: Marland Kitchen      EKG today reveals sinus rhythm with a rate of 82 with a chronic left bundle branch block and left axis deviation with no acute change from prior studies  2D echo 08/16/23 1. Left ventricular ejection fraction, by estimation, is 60 to 65%. The  left ventricle has normal function. The left ventricle has no regional  wall motion abnormalities. Left ventricular diastolic parameters were  normal.   2. Right ventricular systolic function is normal. The right ventricular  size is normal.   3. The mitral valve is normal in structure. No evidence of mitral valve  regurgitation.   4. The aortic valve was not well visualized. Aortic valve regurgitation  is not visualized.    2D echo 03/20/2022: 1. Left ventricular ejection fraction, by estimation, is 35 to 40%. The  left ventricle has moderately decreased function. The left ventricle  demonstrates global hypokinesis. Left ventricular diastolic parameters are  consistent with Grade I diastolic  dysfunction (impaired relaxation). There is septal dyssynchrony.   2. Right ventricular systolic function is mildly reduced. The right  ventricular size is normal. Tricuspid regurgitation signal is inadequate  for assessing PA  pressure.   3. The mitral valve is grossly normal. Trivial mitral valve  regurgitation. No evidence of mitral stenosis.   4. The aortic valve was not well visualized. Aortic valve regurgitation  is not visualized. No aortic stenosis is present.   Zio patch 06/2021: HR 67 - 117, average 82 bpm. Rare supraventricular ectopy. PVC's were present, 5.8%. No sustained arrhythmias. Improved PVC ablation.   Cardiac MRI 07/06/2021: IMPRESSION: 1. Normal left ventricular size with marked septal-lateral dyssynchrony suggestive of LBBB and septal hypokinesis, EF 40%.   2.  Normal right ventricular size and systolic function, EF 45%.   3. Small discrete areas of mid-wall LGE in the apical lateral and apical septal walls. This is not a coronary disease pattern. Possible prior viral myocarditis.   4. Extracellular fluid volume percentage is in normal range, 26% in the septum.   Zio patch 05/2021: Patient had a min HR of 66 bpm, max HR of 179 bpm, and avg HR of 93 bpm. Predominant underlying rhythm was Sinus Rhythm. 1 run of Ventricular Tachycardia occurred lasting 13.4 secs with a max rate of 179 bpm (avg 136 bpm). 1 run of Supraventricular  Tachycardia occurred lasting 6 beats with a max rate of 141 bpm (avg 136 bpm). Isolated SVEs were rare (<1.0%), and no SVE Couplets or SVE Triplets were  present. Isolated VEs were frequent (20.6%, [id number]), VE Couplets were rare (<1.0%, 2692), and VE  Triplets were rare (<1.0%, 754). Ventricular Bigeminy and Trigeminy were present.    Conclusion: 1. Predominantly NSR.  2. No atrial fibrillation noted.  3. 20.6% PVCs, one predominant type.  4. 13.4 second run NSVT.  5. 6 beat run of possible atrial tachycardia.    RHC 03/21/2021: Conclusions: Mildly elevated left heart filling pressure (mean PCWP 20 mmHg). Severely elevated right heart filling pressure (mean RAP 17 mmHg, RVEDP 19 mmHg). Mildly elevated pulmonary artery pressure (mean PAP 28 mmHg) with  increased pulmonary vascular resistance (PVR 2.9 WU). Severely reduced Fick cardiac output/index (CO 2.9, CI 1.3).  Low PAPI suggests significant component of right heart failure.   Recommendations: Increase torsemide to 20 mg daily. Decrease metoprolol succinate to 25 mg daily. Refer to advanced heart failure clinic for expedited evaluation.   2D echo 12/20/2020: 1. Left ventricular ejection fraction, by estimation, is 30 to 35%. The  left ventricle has moderately decreased function. The left ventricle  demonstrates global hypokinesis. There is mild left ventricular  hypertrophy. Left ventricular diastolic  parameters are indeterminate.   2. Right ventricular systolic function is normal. The right ventricular  size is normal. Tricuspid regurgitation signal is inadequate for assessing  PA pressure.   3. The mitral valve is normal in structure. Mild mitral valve  regurgitation. No evidence of mitral stenosis.   2D echo 09/09/2020: 1. Left ventricular ejection fraction, by estimation, is 30 to 35%. The  left ventricle has moderately decreased function. The left ventricle  demonstrates regional wall motion abnormalities (Anterior and anteroseptal  wall hypokinesis). The average left  ventricular global longitudinal strain is -6.8 %. The global longitudinal  strain is abnormal.   2. Right ventricular systolic function is normal. The right ventricular  size is normal. Tricuspid regurgitation signal is inadequate for assessing  PA pressure.   3. The mitral valve is normal in structure. Mild mitral valve  regurgitation.    Coronary CTA 06/30/2020: Aorta:  Normal size.  Mild aortic atherosclerosis.  No dissection.   Aortic Valve:  Trileaflet.  No calcifications.   Coronary Arteries:  Normal coronary origin.  Right dominance.   RCA is a large dominant artery that gives rise to PDA and PLVB. There is mild (25-49%) soft plaque stenosis in the proximal to mid vessel followed by mild  (25-49%) mixed plaque stenosis.   Left main is a large artery that gives rise to LAD and LCX arteries.   LAD is a large vessel that gives rise to medium size D1 and D2 and small D3; there is long, soft plaque stenosis in the mid vessel mild (25-49%) followed by moderate (50-69%).   LCX is a non-dominant artery that gives rise to one large OM1 branch. There is no plaque.   Other findings:   Normal pulmonary vein drainage into the left atrium.   Normal let atrial appendage without a thrombus.   Normal size of the pulmonary artery.   IMPRESSION: 1. Coronary calcium score of 39.3. This was 33 percentile for age and sex matched control.   2. Normal coronary origin with right dominance.   3. Moderate (50-69%) soft plaque stenosis in the mid LAD; mild (25-49%) soft and mixed plaque stenoses in the proximal to mid RCA.   4. Study will be sent for FFR.   FFR 07/01/2020: 1. Left Main: findings 0.99, 0.99   2. LAD: findings 0.99, 0.97 0.92   3. OM:  Findings 0.98, 0.88 0.84   4. RCA: findings 0.97, 0.81 0.84   IMPRESSION: FFR not suggestive of significant obstructive disease.   Coronary CTA 06/09/2020:  Unable to be performed secondary to heart rate -Calcium score 2.67 which places the patient in the 49th percentile for age and sex matched control   2D echo 05/30/2020: 1. Left ventricular ejection fraction, by estimation, is 30 to 35%. The  left ventricle has moderately decreased function. Left ventricular  endocardial border not optimally defined to evaluate regional wall motion.  Left ventricular diastolic parameters  are consistent with Grade II diastolic dysfunction (pseudonormalization).   2. Right ventricular systolic function was not well visualized. The right  ventricular size is normal. Tricuspid regurgitation signal is inadequate  for assessing PA pressure.   3. The mitral valve is normal in structure. Mild mitral valve  regurgitation. No evidence of mitral stenosis.    4. The aortic valve was not well visualized. Aortic valve regurgitation  is not visualized. No aortic stenosis is present.   5. The inferior vena cava is normal in size with greater than 50%  respiratory variability, suggesting right atrial pressure of 3 mmHg.   6. Challenging image quality.   2D echo 08/2019: 1. Left ventricular ejection fraction, by visual estimation, is 25 to  30%. The left ventricle has moderate to severely decreased function. There  is no left ventricular hypertrophy.   2. Left ventricular diastolic parameters are consistent with Grade I  diastolic dysfunction (impaired relaxation).   3. The left ventricle demonstrates global hypokinesis.   4. Global right ventricle has normal systolic function.The right  ventricular size is normal. No increase in right ventricular wall  thickness.   5. Left atrial size was normal.   6. TR signal is inadequate for assessing pulmonary artery systolic  Pressure.   R/LHC 08/2019: Coronary angiography:  Coronary dominance: Right  Left mainstem:   Large vessel that bifurcates into the LAD and left circumflex, no significant disease noted  Left anterior descending (LAD):   Large vessel that extends to the apical region, diagonal branch 2 of moderate size, mild proximal LAD disease estimated at 30%  Left circumflex (LCx):  Large vessel with OM branch 2, no significant disease noted  Right coronary artery (RCA):  Right dominant vessel with PL and PDA, 70% proximal RCA disease followed by sequential 50% lesion proximal to mid vessel  Left ventriculography: Left ventricular systolic function is normal, LVEF is estimated at 50 to 55%, there is no significant mitral regurgitation , no significant aortic valve stenosis  Right heart pressures RA 10 RV 29/8, 11 PA 27/16 mean 20 Wedge 12 Cardiac output 5.04 Cardiac index 2.51  Final Conclusions:   Single-vessel disease RCA Discussed with interventional cardiology, they will  perform FFR to determine if intervention needed Right heart pressures are normal   Recommendations:  We will defer coronary intervention to interventional cardiology Needs weight loss, management of COPD Management of hyperlipidemia   iFR 08/2019:   Conclusions: Single vessel coronary with sequential 70% and 50% proximal and mid RCA stenoses that are not hemodynamically significant (iFR = 0.97).  See Dr. Windell Hummingbird diagnostic catheterization report for further details of right/left heart catheterization.   Recommendations: Medical therapy and risk factor modification to prevent progression of disease. Risk Assessment/Calculations:     HYPERTENSION CONTROL Vitals:   10/07/23 0800 10/07/23 0818  BP: (!) 150/74 (!) 146/82    The patient's blood pressure is elevated above target today.  In order to  address the patient's elevated BP: A new medication was prescribed today.; Blood pressure will be monitored at home to determine if medication changes need to be made.          Physical Exam:   VS:  BP (!) 146/82 (BP Location: Left Arm, Patient Position: Sitting, Cuff Size: Normal)   Pulse 82   Ht 5\' 3"  (1.6 m)   Wt 213 lb 3.2 oz (96.7 kg)   SpO2 98%   BMI 37.77 kg/m    Wt Readings from Last 3 Encounters:  10/07/23 213 lb 3.2 oz (96.7 kg)  09/04/23 216 lb (98 kg)  08/17/23 230 lb 13.2 oz (104.7 kg)    GEN: Well nourished, well developed in no acute distress NECK: No JVD; No carotid bruits CARDIAC: RRR, no murmurs, rubs, gallops RESPIRATORY:  Clear upper lobes with slightly diminished bases to auscultation without rales, wheezing or rhonchi , respirations are unlabored at rest on room air ABDOMEN: Soft, non-tender, obese, non-distended EXTREMITIES: Trace pretibial edema; No deformity   ASSESSMENT AND PLAN: .   HFrEF secondary to NICM.  LVEF on echocardiogram 08/16/2023 with an EF of 60 to 65%, no RWMA, no valvular abnormalities noted.  She appears to be euvolemic on exam with  NYHA class I symptoms.  She also continues to be followed by advanced heart failure service with upcoming appointment in January.  We are restarting her on her Toprol-XL 25 mg daily today to agree escalating GDMT as tolerated.  She is encouraged to keep her follow-ups with advanced heart failure as well.  She is also continued on valsartan 40 mg.  Spironolactone and torsemide remain on hold as she has a recent hospitalization with AKI likely secondary to dehydration.  Kidney function continues to improve.  Will continue to escalate GDMT as tolerated by blood pressure and kidney function.  Nonobstructive coronary artery disease without anginal or anginal equivalents today.  EKG today reveals sinus rhythm with rate of 82 with a chronic left bundle branch block acute changes from prior studies that she has had.  She is continued on aspirin 81 mg daily.  And atorvastatin 10 mg daily.  Hyperlipidemia with an LDL that had remained at goal.  She is continued on atorvastatin 10 mg daily.  Frequent PVCs present on most recent cardiac monitor 5.8% burden.  Recommended she continue her amiodarone 100 mg daily.  Primary hypertension with prior episodes of hypotension during recent hospitalization for AKI, dehydration, and substance she required pressors and IV fluids.  On valsartan and restarted on metoprolol XL 25 mg daily.  Spironolactone and torsemide remain on hold.  Previously she was weaned off of midodrine at her last appointment.  She has been advised to continue to monitor blood pressures at home 1 to 2 hours postmedication administration for systolic blood pressure is 100 or less to hold her medications.  Blood pressures were slightly elevated today at 150/74 and 146/82.  Hypothyroidism where she is continued on her levothyroxine. TSH 5.970 which is improving. Free T4 normal. This continues to be managed by her PCP.  COPD which remained stable without acute exacerbation.  Chronically elevated LFTs that  were mild.  Elevation predates amiodarone use.  Prior viral hepatitis workup was negative.  She is continued on amiodarone 100 mg daily.  She continues to follow with GI.       Dispo: Patient to return to clinic to see MD/APP in 2 months or sooner if needed for reevaluation of symptoms.  Signed, Janeliz Prestwood,  NP

## 2023-10-08 ENCOUNTER — Other Ambulatory Visit (HOSPITAL_COMMUNITY): Payer: Self-pay

## 2023-10-09 ENCOUNTER — Telehealth: Payer: Self-pay

## 2023-10-09 NOTE — Progress Notes (Signed)
   10/09/2023  Patient ID: Kim Buchanan, female   DOB: [birth year], 72 y.o.   MRN: [id number]  Returning patient call regarding Humalog.  She states that she only received 1 box from [employer] PAP.  It appears she is due for re-enrollment for 2025, so I will submit this application online.  I anticipate application to be approved for next year, and medication to be received in the next 4-6 weeks.  The box patient received should last almost 3 months based on current directions of 5 units TID.  Also of note, patient mentioned she has stopped taking Ozempic, because the medication put her in the hospital; and she refuses to restart.  Making PCP aware in case she was not already.  Lenna Gilford, PharmD, DPLA

## 2023-10-15 ENCOUNTER — Other Ambulatory Visit (HOSPITAL_COMMUNITY): Payer: Self-pay | Admitting: Internal Medicine

## 2023-10-17 NOTE — Progress Notes (Signed)
   10/17/2023  Patient ID: Kim Buchanan, female   DOB: [birth year], 72 y.o.   MRN: [id number]  Completed [employer] PAP 2025 re-enrollment application online for Humalog flexpen.  Provider portion has been sent to [facility] email for electronic signature.  Lenna Gilford, PharmD, DPLA

## 2023-10-21 DIAGNOSIS — E1165 Type 2 diabetes mellitus with hyperglycemia: Secondary | ICD-10-CM | POA: Diagnosis not present

## 2023-10-21 DIAGNOSIS — E1159 Type 2 diabetes mellitus with other circulatory complications: Secondary | ICD-10-CM | POA: Diagnosis not present

## 2023-10-21 DIAGNOSIS — Z794 Long term (current) use of insulin: Secondary | ICD-10-CM | POA: Diagnosis not present

## 2023-10-21 DIAGNOSIS — E1169 Type 2 diabetes mellitus with other specified complication: Secondary | ICD-10-CM | POA: Diagnosis not present

## 2023-10-21 DIAGNOSIS — E785 Hyperlipidemia, unspecified: Secondary | ICD-10-CM | POA: Diagnosis not present

## 2023-10-21 DIAGNOSIS — E063 Autoimmune thyroiditis: Secondary | ICD-10-CM | POA: Diagnosis not present

## 2023-10-21 DIAGNOSIS — I152 Hypertension secondary to endocrine disorders: Secondary | ICD-10-CM | POA: Diagnosis not present

## 2023-10-28 ENCOUNTER — Encounter: Payer: Medicare Other | Admitting: Cardiology

## 2023-11-01 ENCOUNTER — Ambulatory Visit: Payer: Medicare Other | Admitting: Cardiology

## 2023-11-01 ENCOUNTER — Other Ambulatory Visit
Admission: RE | Admit: 2023-11-01 | Discharge: 2023-11-01 | Disposition: A | Discharge status: Home / Self Care | Payer: Medicare Other | Source: Ambulatory Visit | Attending: Cardiology | Admitting: Cardiology

## 2023-11-01 VITALS — BP 127/47 | HR 72 | Wt 223.0 lb

## 2023-11-01 DIAGNOSIS — Z79899 Other long term (current) drug therapy: Secondary | ICD-10-CM | POA: Insufficient documentation

## 2023-11-01 DIAGNOSIS — Z794 Long term (current) use of insulin: Secondary | ICD-10-CM | POA: Diagnosis not present

## 2023-11-01 DIAGNOSIS — E039 Hypothyroidism, unspecified: Secondary | ICD-10-CM | POA: Diagnosis not present

## 2023-11-01 DIAGNOSIS — I5022 Chronic systolic (congestive) heart failure: Secondary | ICD-10-CM

## 2023-11-01 DIAGNOSIS — E119 Type 2 diabetes mellitus without complications: Secondary | ICD-10-CM | POA: Diagnosis not present

## 2023-11-01 DIAGNOSIS — I493 Ventricular premature depolarization: Secondary | ICD-10-CM | POA: Diagnosis not present

## 2023-11-01 DIAGNOSIS — J449 Chronic obstructive pulmonary disease, unspecified: Secondary | ICD-10-CM | POA: Insufficient documentation

## 2023-11-01 DIAGNOSIS — I2510 Atherosclerotic heart disease of native coronary artery without angina pectoris: Secondary | ICD-10-CM | POA: Diagnosis not present

## 2023-11-01 DIAGNOSIS — I428 Other cardiomyopathies: Secondary | ICD-10-CM | POA: Diagnosis not present

## 2023-11-01 DIAGNOSIS — Z87891 Personal history of nicotine dependence: Secondary | ICD-10-CM | POA: Diagnosis not present

## 2023-11-01 DIAGNOSIS — Z7982 Long term (current) use of aspirin: Secondary | ICD-10-CM | POA: Insufficient documentation

## 2023-11-01 DIAGNOSIS — I459 Conduction disorder, unspecified: Secondary | ICD-10-CM | POA: Insufficient documentation

## 2023-11-01 DIAGNOSIS — Z79890 Hormone replacement therapy (postmenopausal): Secondary | ICD-10-CM | POA: Insufficient documentation

## 2023-11-01 DIAGNOSIS — Z7984 Long term (current) use of oral hypoglycemic drugs: Secondary | ICD-10-CM | POA: Diagnosis not present

## 2023-11-01 DIAGNOSIS — E785 Hyperlipidemia, unspecified: Secondary | ICD-10-CM | POA: Insufficient documentation

## 2023-11-01 LAB — BASIC METABOLIC PANEL
Anion gap: 11 (ref 5–15)
BUN: 9 mg/dL (ref 8–23)
CO2: 25 mmol/L (ref 22–32)
Calcium: 8.6 mg/dL — ABNORMAL LOW (ref 8.9–10.3)
Chloride: 107 mmol/L (ref 98–111)
Creatinine, Ser: 0.66 mg/dL (ref 0.44–1.00)
GFR, Estimated: >60 mL/min (ref >60)
Glucose, Bld: 113 mg/dL — ABNORMAL HIGH (ref 70–99)
Potassium: 4.7 mmol/L (ref 3.5–5.1)
Sodium: 143 mmol/L (ref 135–145)

## 2023-11-01 LAB — TSH: TSH: 3.901 u[IU]/mL (ref 0.350–4.500)

## 2023-11-01 LAB — LIPID PANEL
Cholesterol: 86 mg/dL (ref 0–200)
HDL: 36 mg/dL — ABNORMAL LOW (ref >40)
LDL Cholesterol: 28 mg/dL (ref 0–99)
Total CHOL/HDL Ratio: 2.4 {ratio}
Triglycerides: 108 mg/dL (ref <150)
VLDL: 22 mg/dL (ref 0–40)

## 2023-11-01 LAB — BRAIN NATRIURETIC PEPTIDE: B Natriuretic Peptide: 184.7 pg/mL — ABNORMAL HIGH (ref 0.0–100.0)

## 2023-11-01 MED ORDER — SPIRONOLACTONE 25 MG PO TABS: 12.5000 mg | ORAL_TABLET | Freq: Every day | ORAL | 3 refills | Status: DC

## 2023-11-01 MED ORDER — PANTOPRAZOLE SODIUM 40 MG PO TBEC: 40.0000 mg | DELAYED_RELEASE_TABLET | Freq: Every day | ORAL | 3 refills | Status: DC

## 2023-11-01 NOTE — Patient Instructions (Signed)
 RESTART Spironolactone  12.5 mg ( 1/2 Tab) daily.  RESTART Protonix  40 mg daily.  Go DOWN to LOWER LEVEL (LL) to have your blood work completed inside of [employer] office.  We will only call you if the results are abnormal or if the provider would like to make medication changes.  Your physician recommends that you schedule a follow-up appointment in: 3 months ( April ) ** PLEASE CALL THE OFFICE IN MID FEBRUARY TO ARRANGE YOUR FOLLOW UP APPOINTMENT.**  At the [facility], you and your health needs are our priority. As part of our continuing mission to provide you with exceptional heart care, we have created designated Provider Care Teams. These Care Teams include your primary Cardiologist (physician) and Advanced Practice Providers (APPs- Physician Assistants and Nurse Practitioners) who all work together to provide you with the care you need, when you need it.   You may see any of the following providers on your designated Care Team at your next follow up: Dr Toribio Fuel Dr Ezra Shuck Dr. Ria Commander Dr. Morene Brownie Amy Lenetta, NP Caffie Shed, [place] [facility] [place], [place] Beckey Coe, NP Jordan Lee, NP Ellouise Class, NP Jaun Bash, PharmD   Please be sure to bring in all your medications bottles to every appointment.    Thank you for choosing [facility]

## 2023-11-03 LAB — HEMOGLOBIN A1C: A1c: 7.1

## 2023-11-03 NOTE — Progress Notes (Signed)
 PCP: Melvin Pao, NP Cardiology: Dr. Perla HF Cardiology: Dr. Rolan  73 y.o. with history of chronic systolic CHF/nonischemic cardiomyopathy, type 2 diabetes, and prior smoking was referred by Dr. Cindie for evaluation of CHF.  She has been noted to have a cardiomyopathy since 2020.  Echo in 11/20 showed EF 25-30%.  LHC in 11/20 and coronary CTA in 9/21 have shown nonobstructive CAD.  She has been evaluated by pulmonary, mild restrictive lung disease and mild COPD.  She quit smoking in 2020, she does not drink ETOH.   Echo in 3/22 showed EF 30-35%, with normal RV.   RHC in 5/22 was worrisome, showing right > left heart failure and low cardiac output with low PAPI.   Zio patch in 8/22 showed 21% PVCs.  Amiodarone  was started.  Cardiac MRI in 9/22 showed EF 40%, septal-lateral dyssynchrony, RV EF 45%, LGE in the mid-wall apical lateral and apical septal walls. Repeat Zio patch in 10/22 showed 5.8% PVCs, improved.    Patient stopped Farxiga  after 3 yeast infections.   Echo in 5/23 showed EF 35-40%, mildly decreased RV systolic function, trivial MR.  Echo in 6/24 showed EF up to 50-55%, normal RV, mild MR, IVC normal. PFTs showed mild obstruction from emphysema.   Patient was admitted in 10/24 with hypotension and AKI. Midodrine  was started and atorvastatin  (LFTs were elevated), Protonix , spironolactone , torsemide , and valsartan  were all stopped.  She was eventually titrated off midodrine .  Echo this admission showed EF 60-65% with normal RV size and systolic function.   She returns for followup of CHF.  No dyspnea with usual ADLs.  No lightheadedness and BP is stable today.  Weight is down 3 lbs.  She has noted GERD after eating now that she is off Protonix .  No exertional chest pain.  No orthopnea/PND.  She is still on amiodarone  despite elevated LFTs, mild LFT elevation predated amiodarone  use.   Labs (6/22): K 4.4, creatinine 0.79 Labs (2/23): K 4.8, creatinine 0.95, LDL 18, TSH 5.37, ALT  102, AST 95, hgb 15.2 Labs (8/23): LDL 74, K 4.3, creatinine 9.07, AST 33, ALT 46 Labs (2/24): LDL 19, TGs 173 Labs (3/24): K 4.9, creatinine 0.99, AST 127, ALT 132 Labs (5/24): K 4.7, creatinine 1.07, AST 147, ALT 73 Labs (6/24): TSH 9.58, LDL 26 Labs (11/24): K 4.4, creatinine 0.85, AST 83, ALT 99 Labs (12/24): TSH mildly elevated  ECG (personally reviewed): NSR, IVCD 126 msec  PMH: 1. Chronic systolic CHF: Nonischemic cardiomyopathy.   - Echo (6/20): Normal EF - Cardiolite (6/20): Anterior ischemia.  - Echo (11/20): EF 25-30% - LHC (11/20): 70% pRCA stenosis, FFR negative.  - Echo (8/21): EF 30-35% - Coronary CTA (9/21): Mild RCA disease, 40-59% mLAD stenosis but CT FFR negative.  - Echo (3/22): EF 30-35% with normal RV - RHC (5/22): mean RA 17, mean PA 28, mean PCWP 20, CI 1.3, PAPI < 1.  - Cardiac MRI (9/22): EF 40%, septal-lateral dyssynchrony, RV EF 45%, LGE in the mid-wall apical lateral and apical septal walls. - Echo (5/23): EF 35-40%, mildly decreased RV systolic function, trivial MR.   - Echo (6/24): EF up to 50-55%, normal RV, mild MR, IVC normal - Echo (10/24): EF 60-65% with normal RV size and systolic function.  2. COPD: Mild.  Quit smoking in 2020.  - PFTs with mild obstruction consistent with emphysema.  3. Elevated LFTs: ?NAFLD - Abdominal US  (2/24): Fatty liver.  4. Type 2 diabetes 5. Coronary artery disease: Nonobstructive.  6.  Hyperlipidemia 7. OSA 8. GERD 9. Depression 10. Elevated LFTs: Long-standing.  HCV negative.  11. PVCs: Zio patch in 8/22 showed 21% PVCs.  Amiodarone  was started. Repeat Zio patch in 10/22 showed 5.8% PVCs, improved.   12. Sleep study (8/22): No OSA.   FH: Brother with heart surgery, ?CHF.  SH: Married, no ETOH, quit smoking in 2020, retired, lives in [place]  ROS: All systems reviewed and negative except as per HPI.   Current Outpatient Medications  Medication Sig Dispense Refill   amiodarone  (PACERONE ) 200 MG tablet Take  0.5 tablets (100 mg total) by mouth daily. 45 tablet 3   aspirin  EC 81 MG tablet Take 1 tablet (81 mg total) by mouth daily. Swallow whole. 30 tablet 11   Continuous Blood Gluc Receiver (FREESTYLE LIBRE 2 READER) DEVI 1 each by Does not apply route daily. 1 each 0   Continuous Glucose Sensor (FREESTYLE LIBRE 2 SENSOR) MISC 1 each by Does not apply route every 14 (fourteen) days. 12 each 1   insulin  degludec (TRESIBA  FLEXTOUCH) 200 UNIT/ML FlexTouch Pen Reduce from 100 units to 70 units.     insulin  lispro (HUMALOG  KWIKPEN) 100 UNIT/ML KwikPen DIAL  AND INJECT 5 UNITS UNDER THE SKIN THREE TIMES A DAY. 30 mL 1   Insulin  Pen Needle 30G X 6 MM MISC 1 Application by Does not apply route daily. 100 each 2   Lancets (ONETOUCH DELICA PLUS LANCET30G) MISC TEST ONCE DAILY 100 each 2   levothyroxine  (SYNTHROID ) 75 MCG tablet Take 1 tablet (75 mcg total) by mouth daily before breakfast. 90 tablet 3   metFORMIN  (GLUCOPHAGE ) 1000 MG tablet TAKE 1 TABLET BY MOUTH TWICE  DAILY WITH MEALS 200 tablet 2   metoprolol  succinate (TOPROL -XL) 25 MG 24 hr tablet TAKE 1 TABLET BY MOUTH DAILY 90 tablet 3   ONETOUCH ULTRA test strip TEST ONCE DAILY 100 strip 3   QUEtiapine  (SEROQUEL ) 200 MG tablet TAKE 1 TABLET BY MOUTH DAILY AT  BEDTIME 90 tablet 3   Fluticasone -Umeclidin-Vilant (TRELEGY ELLIPTA ) 100-62.5-25 MCG/ACT AEPB Inhale 1 puff into the lungs daily.     gabapentin  (NEURONTIN ) 300 MG capsule Take 1 capsule (300 mg total) by mouth 3 (three) times daily. (Patient not taking: Reported on 09/04/2023) 90 capsule 0   glimepiride  (AMARYL ) 4 MG tablet Take 1 tablet (4 mg total) by mouth daily with breakfast. (Patient not taking: Reported on 09/04/2023) 90 tablet 1   nitroGLYCERIN  (NITROSTAT ) 0.3 MG SL tablet Place 1 tablet (0.3 mg total) under the tongue every 5 (five) minutes as needed for chest pain. 100 tablet 3   pantoprazole  (PROTONIX ) 40 MG tablet Take 1 tablet (40 mg total) by mouth daily. 100 tablet 3   polyethylene  glycol powder (GLYCOLAX /MIRALAX ) 17 GM/SCOOP powder Take 17 g by mouth daily. (Patient not taking: Reported on 09/04/2023)     spironolactone  (ALDACTONE ) 25 MG tablet Take 0.5 tablets (12.5 mg total) by mouth daily. 50 tablet 3   tiZANidine  (ZANAFLEX ) 4 MG tablet Take 1 tablet (4 mg total) by mouth every 6 (six) hours as needed for muscle spasms. (Patient not taking: Reported on 09/04/2023) 30 tablet 2   umeclidinium bromide  (INCRUSE ELLIPTA ) 62.5 MCG/ACT AEPB Inhale 1 puff into the lungs daily. (Patient not taking: Reported on 09/04/2023) 1 each 6   No current facility-administered medications for this visit.   BP (!) 127/47   Pulse 72   Wt 223 lb (101.2 kg)   SpO2 98%   BMI 39.50 kg/m  General:  NAD Neck: No JVD, no thyromegaly or thyroid  nodule.  Lungs: Clear to auscultation bilaterally with normal respiratory effort. CV: Nondisplaced PMI.  Heart regular S1/S2, no S3/S4, no murmur.  1+ ankle edema.  No carotid bruit.  Normal pedal pulses.  Abdomen: Soft, nontender, no hepatosplenomegaly, no distention.  Skin: Intact without lesions or rashes.  Neurologic: Alert and oriented x 3.  Psych: Normal affect. Extremities: No clubbing or cyanosis.  HEENT: Normal.   Assessment/Plan: 1. Chronic systolic CHF: Nonischemic cardiomyopathy.  Echo in 3/22 with EF 30-35%, normal RV.  LHC in 11/20 and coronary CTA in 9/21 showed nonobstructive CAD.  RHC in 5/22 was worrisome with R>L heart failure and low cardiac output as well as low PAPI.  Cardiac MRI in 9/22 showed EF 40%, septal-lateral dyssynchrony, RV EF 45%, LGE in the mid-wall apical lateral and apical septal walls.  This was concerning for possible myocarditis (less likely amyloidosis or sarcoidosis).  Echo in 5/23 with EF 35-40%, mildly decreased RV systolic function, trivial MR.  Also concern that PVCs may play a role as Zio monitor in 8/22 showed 21% PVCs, improved to 5.8% with amiodarone .  Echo in 6/24 showed EF up to 50-55%, normal RV, mild MR,  IVC normal.  Improvement in EF may be related to suppression of PVCs. Echo in 10/24 showed normal EF 60-65% with normal RV.   NYHA class II, not volume overloaded on exam.  She is now off spironolactone , valsartan , and torsemide  since admission with AKI and low BP.  - I will leave her off torsemide  for now.  - I will leave her off valsartan  for now.  - Restart spironolactone  at 12.5 mg daily.  BMET/BNP today and BMET in 10 days.   - Off dapagliflozin  with multiple yeast infections.  - Continue Toprol  XL to 25 mg daily.  2. CAD: nonobstructive by LHC in 11/20 and coronary CTA in 9/21.  - Off atorvastatin  with elevated LFTs.  Check lipids and LFTs today, can use Repatha instead of statin.  - Continue ASA 81 daily.  3. PVCs: 21% PVCs on 8/22 Zio monitor.  ?Playing role in cardiomyopathy. PVCs down to 5.8% on Zio in 10/22 on amiodarone .  This may have led to improvement in EF by echo in 6/24 and in 10/24.  - Continue amiodarone  100 mg daily.  On Levoxyl  for hypothyroidism managed by PCP.  Also has chronic mild elevation in LFTs (see below), check today.  4. Elevated LFTs: This appears to be chronic and mild.  LFT elevation pre-dates amiodarone  use, and amiodarone  does not seem to have worsened LFTs.  Suspect NAFLD.  She had workup for viral hepatitis (negative) in the past.   - Statin was stopped.   - Recheck LFTs today.   - As long as there is not a significant rise, continue amiodarone  at low dose.  5. COPD: Prior smoker.  I suspect COPD plays a role in her dyspnea. Breathing has been better recently.  PFTs with mild obstruction suggesting a degree of emphysema.  - Follow with pulmonary.   Followup with me in 3 months.    Ezra Shuck 11/03/2023

## 2023-11-15 ENCOUNTER — Other Ambulatory Visit (HOSPITAL_COMMUNITY): Payer: Self-pay | Admitting: Internal Medicine

## 2023-11-18 ENCOUNTER — Ambulatory Visit: Admission: RE | Admit: 2023-11-18 | Payer: Medicare Other | Source: Ambulatory Visit

## 2023-11-19 ENCOUNTER — Ambulatory Visit: Payer: Medicare Other

## 2023-11-25 ENCOUNTER — Other Ambulatory Visit: Payer: Self-pay | Admitting: Nurse Practitioner

## 2023-11-25 NOTE — Telephone Encounter (Signed)
Medication Refill -  Most Recent Primary Care Visit:  Provider: [facility]  Department: [facility]  Visit Type: LAB  Date: 09/24/2023  Medication: tiZANidine (ZANAFLEX) 4 MG tablet   Has the patient contacted their pharmacy? No   Is this the correct pharmacy for this prescription? Yes If no, delete pharmacy and type the correct one.  This is the patient's preferred pharmacy:   [facility] - [place], [place] - [address] Phone: [phone]  Fax: [phone]      Has the prescription been filled recently? No  Is the patient out of the medication? Yes as of yesterday  Has the patient been seen for an appointment in the last year OR does the patient have an upcoming appointment? Yes  Can we respond through MyChart? Yes  Please assist patient further

## 2023-11-26 ENCOUNTER — Other Ambulatory Visit: Payer: Self-pay | Admitting: Nurse Practitioner

## 2023-11-26 MED ORDER — TIZANIDINE HCL 4 MG PO TABS: 4.0000 mg | ORAL_TABLET | Freq: Four times a day (QID) | ORAL | 2 refills | Status: DC | PRN

## 2023-11-26 NOTE — Telephone Encounter (Signed)
 Requested medication (s) are due for refill today -yes  Requested medication (s) are on the active medication list -yes  Future visit scheduled -yes  Last refill: 08/14/23 #30 2RF  Notes to clinic: non delegated Rx  Requested Prescriptions  Pending Prescriptions Disp Refills   tiZANidine  (ZANAFLEX ) 4 MG tablet 30 tablet 2    Sig: Take 1 tablet (4 mg total) by mouth every 6 (six) hours as needed for muscle spasms.     Not Delegated - Cardiovascular:  Alpha-2 Agonists - tizanidine  Failed - 11/26/2023  2:06 PM      Failed - This refill cannot be delegated      Passed - Valid encounter within last 6 months    Recent Outpatient Visits           3 months ago Hypothyroidism, unspecified type   [place] [facility] Melvin Pao, NP   7 months ago Hypothyroidism, unspecified type   [place] [facility] Melvin Pao, NP   9 months ago Hypertension associated with diabetes [facility])   [place] [facility] Melvin Pao, NP   9 months ago Encounter for screening mammogram for malignant neoplasm of breast   [facility] Melvin Pao, NP   10 months ago Right lower quadrant abdominal pain   [place] [facility] Melvin Pao, NP       Future Appointments             In 2 weeks Gerard Frederick, NP [facility] [place]   In 2 months Melvin Pao, NP [place] [facility]               Requested Prescriptions  Pending Prescriptions Disp Refills   tiZANidine  (ZANAFLEX ) 4 MG tablet 30 tablet 2    Sig: Take 1 tablet (4 mg total) by mouth every 6 (six) hours as needed for muscle spasms.     Not Delegated - Cardiovascular:  Alpha-2 Agonists - tizanidine  Failed - 11/26/2023  2:06 PM      Failed - This refill cannot be delegated      Passed - Valid encounter within last 6 months    Recent Outpatient Visits           3 months ago  Hypothyroidism, unspecified type   [place] [facility] Melvin Pao, NP   7 months ago Hypothyroidism, unspecified type   [place] Melvin Pao, NP   9 months ago Hypertension associated with diabetes [facility])   [place] [facility] Melvin Pao, NP   9 months ago Encounter for screening mammogram for malignant neoplasm of breast   [place] [facility] Melvin Pao, NP   10 months ago Right lower quadrant abdominal pain   [place] [facility] Melvin Pao, NP       Future Appointments             In 2 weeks Gerard Frederick, NP [facility] [place]   In 2 months Melvin Pao, NP [place] [facility]

## 2023-11-27 NOTE — Telephone Encounter (Signed)
 Requested medication (s) are due for refill today: no  Requested medication (s) are on the active medication list: yes  Last refill:  11/26/23 #30 2 RF  Future visit scheduled: yes  Notes to clinic:  med not delegated to NT to refuse   Requested Prescriptions  Pending Prescriptions Disp Refills   tiZANidine  (ZANAFLEX ) 4 MG tablet [Pharmacy Med Name: TIZANIDINE  HCL 4 MG TABLET] 30 tablet 0    Sig: Take 1 tablet (4 mg total) by mouth every 6 (six) hours as needed for muscle spasms.     Not Delegated - Cardiovascular:  Alpha-2 Agonists - tizanidine  Failed - 11/27/2023  4:15 PM      Failed - This refill cannot be delegated      Passed - Valid encounter within last 6 months    Recent Outpatient Visits           3 months ago Hypothyroidism, unspecified type   [place] [facility] Kim Pao, NP   7 months ago Hypothyroidism, unspecified type   [place] [facility] Kim Pao, NP   9 months ago Hypertension associated with diabetes [facility])   [place] [facility] Kim Pao, NP   9 months ago Encounter for screening mammogram for malignant neoplasm of breast   [place] [facility] Kim Pao, NP   10 months ago Right lower quadrant abdominal pain   [place] [facility] Kim Pao, NP       Future Appointments             In 2 weeks Kim Frederick, NP [place] [place]   In 2 months Kim Pao, NP [place] [facility]

## 2023-11-30 ENCOUNTER — Other Ambulatory Visit (HOSPITAL_COMMUNITY): Payer: Self-pay | Admitting: Internal Medicine

## 2023-12-05 ENCOUNTER — Telehealth: Payer: Self-pay

## 2023-12-05 NOTE — Progress Notes (Unsigned)
Pharmacy Medication Assistance Program Note    12/05/2023  Patient ID: Kim Buchanan, female   DOB: 1951/07/31, 73 y.o.   MRN: [id number]     12/05/2023  Outreach Medication One  Initial Outreach Date (Medication One) 10/09/2023  Manufacturer Medication One [employer]  [employer] Humalog  Dose of Humalog 100 U  Type of Assistance Manufacturer Assistance  Date Application Sent to Prescriber 10/09/2023  Name of Prescriber [name]  Patient Assistance Determination Approved  Approval Start Date 10/23/2023  Approval End Date 10/21/2024  Patient Notification Method MyChart     Signature

## 2023-12-05 NOTE — Telephone Encounter (Addendum)
PAP: Patient assistance application for Humalog has been approved by PAP Companies: [employer] from 10/23/2023 to 12/312025. Medication should be delivered to PAP Delivery: Home. For further shipping updates, please contact [employer] at [phone]. Patient ID is: NO ID   PLEASE BE ADVISED  PROCESS SHIPMENT ON 12/05/2023 WITH PHARMACY  [facility] [phone]

## 2023-12-11 ENCOUNTER — Ambulatory Visit: Payer: Medicare Other | Admitting: Cardiology

## 2023-12-23 ENCOUNTER — Telehealth: Payer: Self-pay

## 2023-12-23 MED ORDER — INSULIN LISPRO (1 UNIT DIAL) 100 UNIT/ML (KWIKPEN)
7.0000 [IU] | PEN_INJECTOR | Freq: Three times a day (TID) | SUBCUTANEOUS | 4 refills | Status: DC
Start: 2023-12-23 — End: 2023-12-24

## 2023-12-23 NOTE — Progress Notes (Signed)
   12/23/2023  Patient ID: Kim Buchanan, female   DOB: [birth year], 73 y.o.   MRN: [id number]  Incoming call from patient stating she has started her last Humalog insulin pen.  Contacted [employer] patient assistance program, and they state they are needing either a new prescription electronically sent to [facility], or a hardcopy with a wet ink signature faxed.  Pending prescription to be sent electronically to the pharmacy.  Contacted the patient, and she has enough medication on hand to last another 2 weeks.  Will check on processing status the end of this week to make sure patient will receive before she runs out.  Telephone follow-up appointment to address control of diabetes scheduled in 4 weeks.  Lenna Gilford, PharmD, DPLA

## 2023-12-24 ENCOUNTER — Other Ambulatory Visit: Payer: Self-pay

## 2023-12-24 MED ORDER — INSULIN LISPRO (1 UNIT DIAL) 100 UNIT/ML (KWIKPEN): 7.0000 [IU] | PEN_INJECTOR | Freq: Three times a day (TID) | SUBCUTANEOUS | 4 refills | Status: DC

## 2023-12-24 NOTE — Telephone Encounter (Signed)
 Received fax from pharmacy requesting to have a faxed prescription with the providers signature on the RX per state regulations. RX t'd up to be printed, signed, and then faxed.

## 2023-12-26 ENCOUNTER — Telehealth: Payer: Self-pay

## 2023-12-26 NOTE — Progress Notes (Signed)
   12/26/2023  Patient ID: Kim Buchanan, female   DOB: [birth year], 73 y.o.   MRN: [id number]  Contacted [employer] to follow-up on prescription processing/shipping of Humalog.  Representative states prescription has been sent into processing, and patient should receive tomorrow.  She is also enrolled in an automatic refills.  Lenna Gilford, PharmD, DPLA

## 2023-12-31 NOTE — Progress Notes (Unsigned)
 Cardiology Clinic Note   Date: 01/01/2024 ID: [id number] Kim Buchanan, DOB 1951/03/13, MRN [id number]  [facility] Providers Cardiologist:  Julien Nordmann, MD Electrophysiologist:  Lanier Prude, MD {  Chief Complaint   Kim Buchanan is a 73 y.o. female who presents to the clinic today for routine follow up.   Patient Profile   Kim Buchanan is followed by Dr. Mariah Milling and Dr. Lalla Brothers for the history outlined below.      Past medical history significant for: CAD. R/LHC 09/11/2019: Mid LAD 30%.  Proximal RCA #1 70%, #2 50%, nonsignificant by iFR.  EF by visual estimate of 50 to 55%.  Normal LVEDP. Chronic HFimpEF. RHC 03/21/2021: Mildly elevated left heart filling pressure.  Severely elevated right heart filling pressure.  Mildly elevated PA pressure with increased pulmonary vascular resistance.  Severely reduced Fick cardiac output/index.  Low PAPI suggest significant component of right heart failure. Cardiac MRI 07/06/2021: Normal LV size with marked septal-lateral dyssynchrony suggestive of LBBB and septal hypokinesis, EF 40%.  Normal right ventricular size and systolic function, EF 45%.  Small discrete areas of minimal LGE in the apical lateral and apical septal walls.  This is not a coronary disease pattern.  Possible prior viral myocarditis.  Extracellular fluid volume percentage is at normal range, 26% of the septum. Echo 08/16/2023: EF 60 to 65%.  No RWMA.  Normal diastolic parameters.  Normal RV size/function.  No significant valvular abnormalities. Frequent PVCs. 7-day ZIO 05/22/2021: HR 66 to 179 bpm, average 53 bpm.  Predominantly NSR.  No A-fib.  13.4 seconds run of NSVT.  6 beat run of possible A. tach.  20.6% PVCs.  Amiodarone recommended. 7-day ZIO 07/19/2021: HR 67 to 117 bpm, average 82 bpm.  Rare PACs.  5.8% PVCs.  No sustained arrhythmias. Hypertension. Hyperlipidemia. Lipid panel 11/01/2023: LDL 28, HDL 36, TG 108, total  86. COPD. OSA. Hypothyroidism. T2DM. GAD.  In summary, patient was previously followed by Dr. Welton Flakes.  She had an echo June 2020 which showed normal LV function.  Stress testing secondary to dyspnea showed anterior ischemia.  He was recommended she undergo cardiac CTA but did not follow-up and was subsequently evaluated by pulmonology.  She established care with Dr. Mariah Milling in October 2020.  Echo in November 2020 revealed EF 25 to 30%, Grade I DD, global hypokinesis.  She underwent [facility] November 2020 which revealed nonobstructive CAD and normal filling pressures as detailed above.  Repeat echo August 2021 showed EF 30 to 35%, Grade II DD, normal RV size, mild MR.  Coronary CTA September 2021 showed calcium score of 39.3 with moderate soft plaque in mid LAD at mild mixed plaque in proximal and mid RCA felt to be nonsignificant by FFR.  Limited echo November 2021 demonstrated EF 30 to 35%.  RHC May 2022 showed elevated filling pressures as detailed above and she was referred to advanced heart failure clinic.  She was noted to have frequent PVCs and underwent outpatient ZIO monitoring which revealed a PVC burden of 20.6% and she was placed on amiodarone.  Repeat ZIO monitoring demonstrated improved PVC burden to 5.8%.  Cardiac MRI September 2022 was performed and is detailed above.  GDMT was limited by hypotension, dizziness, recurrent yeast infections.  Amiodarone was reduced secondary to abnormal LFTs.  Echo June 2024 showed EF of 50 to 55%, normal RV function, mild MR.  It was felt improvement of EF likely related to suppression of PVCs.  She underwent hospital admission October 2024  for generalized fatigue and presyncopal events.  Upon arrival to ED she was found to be hypotensive and given 1 L of fluid.  She eventually was started on norepinephrine and admitted to ICU for further management.  Labs were significant for AKI with creatinine 2.  Echo at that time revealed EF 60 to 65% as detailed  above.  Patient was last seen in the office by Charlsie Quest, NP on 10/07/2023 for routine follow-up.  She was doing well at that time.  No medication changes were made.  Patient was seen by Dr. Shirlee Latch and advanced heart failure clinic on 10/29/2023 she was doing well at that time.  Torsemide and valsartan continue to be held and spironolactone was restarted.  No other changes were made.     History of Present Illness    Today, patient is accompanied by her husband. She reports she is doing very well. Patient denies shortness of breath, dyspnea on exertion, lower extremity edema, orthopnea or PND. She weighs at home inconsistently. She denies abdominal bloating/tightness or early satiety.  No chest pain, pressure, or tightness. No palpitations.  She is trying to lose weight and has been increasing her walking for exercise with good tolerance. She restricts sodium in her diet. She denies dizziness.     ROS: All other systems reviewed and are otherwise negative except as noted in History of Present Illness.  EKGs/Labs Reviewed        09/04/2023: ALT 99; AST 83 11/01/2023: BUN 9; Creatinine, Ser 0.66; Potassium 4.7; Sodium 143   09/04/2023: Hemoglobin 12.5; WBC 6.2   11/01/2023: TSH 3.901   11/01/2023: B Natriuretic Peptide 184.7   Physical Exam    VS:  BP (!) 112/56   Pulse 60   Ht 5\' 3"  (1.6 m)   Wt 213 lb 12.8 oz (97 kg)   SpO2 96%   BMI 37.87 kg/m  , BMI Body mass index is 37.87 kg/m.  GEN: Well nourished, well developed, in no acute distress. Neck: No JVD or carotid bruits. Cardiac:  RRR. No murmurs. No rubs or gallops.   Respiratory:  Respirations regular and unlabored. Clear to auscultation without rales, wheezing or rhonchi. GI: Soft, nontender, nondistended. Extremities: Radials/DP/PT 2+ and equal bilaterally. No clubbing or cyanosis. No edema.  Skin: Warm and dry, no rash. Neuro: Strength intact.  Assessment & Plan   Nonobstructive CAD [facility] November 2020  demonstrated mid LAD 30%, proximal RCA #1 75%, #2 50% found to be nonsignificant by iFR.  Patient denies chest pain, pressure or tightness. She is working on losing weight and has increased her walking for exercise.  -Advance physical activity as tolerated.  -Continue aspirin, Toprol, as needed SL NTG.  Chronic HFimpEF Cardiac MRI September 2022 demonstrated normal LV size with marked septal-lateral dyssynchrony suggestive of LBBB and septal hypokinesis, normal right ventricular size and systolic function, possible prior viral myocarditis.  Echo October 2024 showed recovered LV function with EF 60 to 65%, no RWMA, normal diastolic parameters, normal RV size/function.  Patient denies lower extremity edema, abdominal bloating/tightness, orthopnea or PND. She is walking more with good tolerance.  Euvolemic and well compensated on exam. -Continue spironolactone and Toprol.  SGLT2i contraindicated secondary to recurrent yeast infections.  GDMT limited by soft BP.  -CMP today.  Frequent PVCs PVC burden 20.6% August 2022, patient started on amiodarone.  Repeat ZIO monitor September 2022 demonstrated decrease PVC burden of 5.8%.  Patient denies palpitations. RRR on exam today with no extrasystole.  -Continue amiodarone.  Hyperlipidemia LDL January 2025 28, at goal.  Not on statin secondary to elevated LFTs. -Repeat CMP today.   Hypertension BP today 112/56. Patient denies dizziness or headaches.  -Continue Toprol and spironolactone. Will not restart ARB today.   Disposition: CMP today. Keep follow up with AHF clinic. Return in 6 months or sooner as needed.          Signed, Etta Grandchild. Nelline Lio, DNP, NP-C

## 2024-01-01 ENCOUNTER — Encounter: Payer: Self-pay | Admitting: Student

## 2024-01-01 ENCOUNTER — Ambulatory Visit: Payer: Medicare Other | Attending: Student | Admitting: Student

## 2024-01-01 VITALS — BP 112/56 | HR 60 | Ht 63.0 in | Wt 213.8 lb

## 2024-01-01 DIAGNOSIS — E785 Hyperlipidemia, unspecified: Secondary | ICD-10-CM | POA: Diagnosis not present

## 2024-01-01 DIAGNOSIS — I5032 Chronic diastolic (congestive) heart failure: Secondary | ICD-10-CM

## 2024-01-01 DIAGNOSIS — I2510 Atherosclerotic heart disease of native coronary artery without angina pectoris: Secondary | ICD-10-CM

## 2024-01-01 DIAGNOSIS — I10 Essential (primary) hypertension: Secondary | ICD-10-CM

## 2024-01-01 DIAGNOSIS — I493 Ventricular premature depolarization: Secondary | ICD-10-CM

## 2024-01-01 DIAGNOSIS — Z79899 Other long term (current) drug therapy: Secondary | ICD-10-CM | POA: Diagnosis not present

## 2024-01-01 NOTE — Patient Instructions (Signed)
 Medication Instructions:  Your Physician recommend you continue on your current medication as directed.    *If you need a refill on your cardiac medications before your next appointment, please call your pharmacy*   Lab Work: Your provider would like for you to have following labs drawn today CMeT.   If you have labs (blood work) drawn today and your tests are completely normal, you will receive your results only by: MyChart Message (if you have MyChart) OR A paper copy in the mail If you have any lab test that is abnormal or we need to change your treatment, we will call you to review the results.   Follow-Up: At [facility], you and your health needs are our priority.  As part of our continuing mission to provide you with exceptional heart care, we have created designated Provider Care Teams.  These Care Teams include your primary Cardiologist (physician) and Advanced Practice Providers (APPs -  Physician Assistants and Nurse Practitioners) who all work together to provide you with the care you need, when you need it.  We recommend signing up for the patient portal called "MyChart".  Sign up information is provided on this After Visit Summary.  MyChart is used to connect with patients for Virtual Visits (Telemedicine).  Patients are able to view lab/test results, encounter notes, upcoming appointments, etc.  Non-urgent messages can be sent to your provider as well.   To learn more about what you can do with MyChart, go to [email or web address].    Your next appointment:   6 month(s)  Provider:   You may see Julien Nordmann, MD or one of the following Advanced Practice Providers on your designated Care Team:   Nicolasa Ducking, NP Eula Listen, PA-C Cadence Fransico Michael, PA-C Charlsie Quest, NP Carlos Levering, NP

## 2024-01-02 ENCOUNTER — Ambulatory Visit: Payer: Medicare Other

## 2024-01-02 VITALS — Ht 63.0 in | Wt 213.0 lb

## 2024-01-02 DIAGNOSIS — Z780 Asymptomatic menopausal state: Secondary | ICD-10-CM | POA: Diagnosis not present

## 2024-01-02 DIAGNOSIS — Z0000 Encounter for general adult medical examination without abnormal findings: Secondary | ICD-10-CM

## 2024-01-02 DIAGNOSIS — Z1231 Encounter for screening mammogram for malignant neoplasm of breast: Secondary | ICD-10-CM

## 2024-01-02 LAB — COMPREHENSIVE METABOLIC PANEL
ALT: 58 IU/L — ABNORMAL HIGH (ref 0–32)
AST: 65 IU/L — ABNORMAL HIGH (ref 0–40)
Albumin: 4 g/dL (ref 3.9–4.9)
Alkaline Phosphatase: 187 IU/L — ABNORMAL HIGH (ref 44–121)
BUN/Creatinine Ratio: 20 (ref 12–28)
BUN: 15 mg/dL (ref 8–27)
Bilirubin Total: 0.7 mg/dL (ref 0.0–1.2)
CO2: 18 mmol/L — ABNORMAL LOW (ref 20–29)
Calcium: 8.9 mg/dL (ref 8.7–10.3)
Chloride: 103 mmol/L (ref 96–106)
Creatinine, Ser: 0.76 mg/dL (ref 0.57–1.00)
Globulin, Total: 3.2 g/dL (ref 1.5–4.5)
Glucose: 221 mg/dL — ABNORMAL HIGH (ref 70–99)
Potassium: 4.7 mmol/L (ref 3.5–5.2)
Sodium: 140 mmol/L (ref 134–144)
Total Protein: 7.2 g/dL (ref 6.0–8.5)
eGFR: 84 mL/min/{1.73_m2} (ref >59)

## 2024-01-02 NOTE — Progress Notes (Signed)
 Subjective:   Kim Buchanan is a 73 y.o. who presents for a Medicare Wellness preventive visit.  Visit Complete: Virtual I connected with  Kim Buchanan on 01/02/24 by a audio enabled telemedicine application and verified that I am speaking with the correct person using two identifiers.  Patient Location: Home  Provider Location: Home Office  I discussed the limitations of evaluation and management by telemedicine. The patient expressed understanding and agreed to proceed.  Vital Signs: Because this visit was a virtual/telehealth visit, some criteria may be missing or patient reported. Any vitals not documented were not able to be obtained and vitals that have been documented are patient reported.  VideoDeclined- This patient declined Librarian, academic. Therefore the visit was completed with audio only.  AWV Questionnaire: No: Patient Medicare AWV questionnaire was not completed prior to this visit.  Cardiac Risk Factors include: advanced age (>65men, >57 women);hypertension;diabetes mellitus;obesity (BMI >30kg/m2);Other (see comment), Risk factor comments: CAD     Objective:    Today's Vitals   01/02/24 0800  Weight: 213 lb (96.6 kg)  Height: 5\' 3"  (1.6 m)   Body mass index is 37.73 kg/m.     01/02/2024    8:13 AM 08/15/2023    4:23 PM 12/10/2022   10:36 AM 11/20/2022    9:50 AM 01/04/2022   10:59 AM 03/21/2021    9:44 AM 12/02/2020    9:02 AM  Advanced Directives  Does Patient Have a Medical Advance Directive? Yes No Yes Yes No No Yes  Type of Estate agent of [place];Living will  Healthcare Power of [place];Living will    Healthcare Power of [place];Living will  Does patient want to make changes to medical advance directive? No - Patient declined  No - Patient declined      Copy of Healthcare Power of Attorney in Chart? Yes - validated most recent copy scanned in chart (See row information)  Yes - validated most  recent copy scanned in chart (See row information)    No - copy requested  Would patient like information on creating a medical advance directive?  No - Patient declined No - Patient declined No - Patient declined       Current Medications (verified) Outpatient Encounter Medications as of 01/02/2024  Medication Sig   amiodarone (PACERONE) 200 MG tablet Take 0.5 tablets (100 mg total) by mouth daily.   aspirin EC 81 MG tablet Take 1 tablet (81 mg total) by mouth daily. Swallow whole.   Continuous Glucose Sensor (FREESTYLE LIBRE 2 SENSOR) MISC 1 each by Does not apply route every 14 (fourteen) days.   gabapentin (NEURONTIN) 300 MG capsule Take 1 capsule (300 mg total) by mouth 3 (three) times daily.   insulin degludec (TRESIBA FLEXTOUCH) 200 UNIT/ML FlexTouch Pen Reduce from 100 units to 70 units.   insulin lispro (HUMALOG KWIKPEN) 100 UNIT/ML KwikPen Inject 7 Units into the skin 3 (three) times daily. With meals.   Insulin Pen Needle 30G X 6 MM MISC 1 Application by Does not apply route daily.   Lancets (ONETOUCH DELICA PLUS LANCET30G) MISC TEST ONCE DAILY   levothyroxine (SYNTHROID) 75 MCG tablet Take 1 tablet (75 mcg total) by mouth daily before breakfast.   metFORMIN (GLUCOPHAGE) 1000 MG tablet TAKE 1 TABLET BY MOUTH TWICE  DAILY WITH MEALS   metoprolol succinate (TOPROL-XL) 25 MG 24 hr tablet TAKE 1 TABLET BY MOUTH DAILY   ONETOUCH ULTRA test strip TEST ONCE DAILY   pantoprazole (  PROTONIX) 40 MG tablet Take 1 tablet (40 mg total) by mouth daily.   polyethylene glycol powder (GLYCOLAX/MIRALAX) 17 GM/SCOOP powder Take 17 g by mouth daily.   QUEtiapine (SEROQUEL) 200 MG tablet TAKE 1 TABLET BY MOUTH DAILY AT  BEDTIME   spironolactone (ALDACTONE) 25 MG tablet Take 0.5 tablets (12.5 mg total) by mouth daily.   tiZANidine (ZANAFLEX) 4 MG tablet Take 1 tablet (4 mg total) by mouth every 6 (six) hours as needed for muscle spasms.   Continuous Blood Gluc Receiver (FREESTYLE LIBRE 2 READER) DEVI 1  each by Does not apply route daily. (Patient not taking: Reported on 01/02/2024)   Fluticasone-Umeclidin-Vilant (TRELEGY ELLIPTA) 100-62.5-25 MCG/ACT AEPB Inhale 1 puff into the lungs daily. (Patient not taking: Reported on 01/02/2024)   glimepiride (AMARYL) 4 MG tablet Take 1 tablet (4 mg total) by mouth daily with breakfast. (Patient not taking: Reported on 09/04/2023)   nitroGLYCERIN (NITROSTAT) 0.3 MG SL tablet Place 1 tablet (0.3 mg total) under the tongue every 5 (five) minutes as needed for chest pain.   umeclidinium bromide (INCRUSE ELLIPTA) 62.5 MCG/ACT AEPB Inhale 1 puff into the lungs daily. (Patient not taking: Reported on 09/04/2023)   No facility-administered encounter medications on file as of 01/02/2024.    Allergies (verified) Entresto [sacubitril-valsartan] and Lisinopril   History: Past Medical History:  Diagnosis Date   Anxiety    Arthritis    lower back   COPD (chronic obstructive pulmonary disease) (HCC)    Depression    Diabetes mellitus without complication (HCC)    GERD (gastroesophageal reflux disease)    Hyperlipidemia    Morbid obesity (HCC)    NICM (nonischemic cardiomyopathy) (HCC)    a. 03/2019 Echo: Nl LVEF; b. 09/04/2019 Echo: EF 25-30%; c. 09/11/2019 Cath: nonobs LAD/RCA dzs. EF 50-55%; c. 12/2020 Echo: EF 30-35%, glob HK. Mild LVH. Nl RVSP. Mild MR; d. 02/2021 RHC: Elev RH pressures. CO/CI 2.8/1.3; e. 06/2021 cMRI: EF 40%, marked sept-lat dyssynch & septal HK. Small, discrete areas of mid-wall LGE in apical lateral and apical septal walls - ? h/o myocarditis.   Nonobstructive CAD (coronary artery disease)    a. 03/2019 [facility] [place] [facility]): EF 63%, ant ischemia; b. 08/2019 Cath: LM nl, LAD 62m, RCA 70p (iFR nl @ 0.97), 50p. EF 50-55%-->Med Rx; c. 06/2020 Cor CTA: Nl FFRs throughout cor tree.   Osteoporosis    Panic attack    PTSD (post-traumatic stress disorder)    PVC's (premature ventricular contractions)    a. 05/2021 Zio: 20.6% PVC burden. 13.4 secs of NSVT-->amio  added; b. 06/2021 Zio: 5.8% PVC burden. Rare supraventrciular ectopy.   Rectal fistula    Sleep apnea    a. Prev wore CPAP but lost insurance and hasn't been using since (now has insurance).   Wears dentures    full upper   Past Surgical History:  Procedure Laterality Date   CARDIAC CATHETERIZATION     COLONOSCOPY  2014   COLONOSCOPY WITH PROPOFOL N/A 10/01/2016   Procedure: COLONOSCOPY WITH PROPOFOL;  Surgeon: Midge Minium, MD;  Location: [facility];  Service: Endoscopy;  Laterality: N/A;   COLONOSCOPY WITH PROPOFOL N/A 01/04/2022   Procedure: COLONOSCOPY WITH PROPOFOL;  Surgeon: Midge Minium, MD;  Location: [facility];  Service: Endoscopy;  Laterality: N/A;   CORONARY PRESSURE/FFR WITH 3D MAPPING N/A 09/11/2019   Procedure: Coronary Pressure Wire/FFR w/3D Mapping;  Surgeon: Yvonne Kendall, MD;  Location: [facility];  Service: Cardiovascular;  Laterality: N/A;   CYSTOCELE REPAIR N/A  12/17/2016   Procedure: ANTERIOR REPAIR (CYSTOCELE);  Surgeon: Herold Harms, MD;  Location: [facility] OR[facility];  Service: Gynecology;  Laterality: N/A;   ESOPHAGOGASTRODUODENOSCOPY (EGD) WITH PROPOFOL N/A 08/11/2019   Procedure: ESOPHAGOGASTRODUODENOSCOPY (EGD) WITH PROPOFOL;  Surgeon: Midge Minium, MD;  Location: [facility];  Service: Endoscopy;  Laterality: N/A;   POLYPECTOMY  10/01/2016   Procedure: POLYPECTOMY;  Surgeon: Midge Minium, MD;  Location: [facility];  Service: Endoscopy;;   RECTAL SURGERY  06/28/2015   Rectal prolapse, laparoscopic rectopexy the coldCharmont, MD; [facility]   RIGHT HEART CATH N/A 03/21/2021   Procedure: RIGHT HEART CATH;  Surgeon: Yvonne Kendall, MD;  Location: [facility];  Service: Cardiovascular;  Laterality: N/A;   RIGHT/LEFT HEART CATH AND CORONARY ANGIOGRAPHY Bilateral 09/11/2019   Procedure: RIGHT/LEFT HEART CATH AND CORONARY ANGIOGRAPHY;  Surgeon: Antonieta Iba, MD;  Location: [facility];  Service: Cardiovascular;   Laterality: Bilateral;   TOTAL ABDOMINAL HYSTERECTOMY W/ BILATERAL SALPINGOOPHORECTOMY     TUBAL LIGATION     VAGINAL HYSTERECTOMY Bilateral 12/17/2016   Procedure: HYSTERECTOMY VAGINAL WITH BILATERAL SALPINGO OOPHERECTOMY;  Surgeon: Herold Harms, MD;  Location: [facility];  Service: Gynecology;  Laterality: Bilateral;   Family History  Problem Relation Age of Onset   Diabetes Mother    Diabetes Sister    Cancer Sister    Diabetes Brother    Lymphoma Brother    Heart disease Father    Hypertension Father    Diabetes Sister    Diabetes Brother    Lymphoma Brother    Heart disease Brother    Healthy Sister    Breast cancer Neg Hx    Ovarian cancer Neg Hx    Colon cancer Neg Hx    Social History   Socioeconomic History   Marital status: Married    Spouse name: Solicitor   Number of children: 1   Years of education: Not on file   Highest education level: 9th grade  Occupational History    Employer: DISABLED    Comment: for panic attacks  Tobacco Use   Smoking status: Former    Current packs/day: 0.00    Average packs/day: 1.5 packs/day for 50.0 years (75.0 ttl pk-yrs)    Types: Cigarettes    Start date: 11/29/1968    Quit date: 11/29/2018    Years since quitting: 5.0   Smokeless tobacco: Never   Tobacco comments:    started at age 36  Vaping Use   Vaping status: Never Used  Substance and Sexual Activity   Alcohol use: No    Alcohol/week: 0.0 standard drinks of alcohol   Drug use: No   Sexual activity: Not Currently  Other Topics Concern   Not on file  Social History Narrative   Pt had one son, who died when was 71 YO in a MVA.   Social Drivers of Corporate investment banker Strain: Low Risk  (01/02/2024)   Overall Financial Resource Strain (CARDIA)    Difficulty of Paying Living Expenses: Not hard at all  Food Insecurity: No Food Insecurity (01/02/2024)   Hunger Vital Sign    Worried About Running Out of Food in the Last Year: Never true    Ran Out of Food  in the Last Year: Never true  Transportation Needs: No Transportation Needs (01/02/2024)   PRAPARE - Administrator, Civil Service (Medical): No    Lack of Transportation (Non-Medical): No  Physical Activity: Inactive (01/02/2024)   Exercise Vital  Sign    Days of Exercise per Week: 0 days    Minutes of Exercise per Session: 0 min  Stress: No Stress Concern Present (01/02/2024)   [employer] of Occupational Health - Occupational Stress Questionnaire    Feeling of Stress : Not at all  Social Connections: Socially Isolated (01/02/2024)   Social Connection and Isolation Panel [NHANES]    Frequency of Communication with Friends and Family: Once a week    Frequency of Social Gatherings with Friends and Family: Once a week    Attends Religious Services: Never    Database administrator or Organizations: No    Attends Engineer, structural: Never    Marital Status: Married    Tobacco Counseling Counseling given: Not Answered Tobacco comments: started at age 54    Clinical Intake:  Pre-visit preparation completed: Yes  Pain : No/denies pain     BMI - recorded: 37.73 Nutritional Status: BMI > 30  Obese Nutritional Risks: None Diabetes: Yes CBG done?: No Did pt. bring in CBG monitor from home?: No  How often do you need to have someone help you when you read instructions, pamphlets, or other written materials from your doctor or pharmacy?: 1 - Never  Interpreter Needed?: No  Information entered by :: Tora Kindred, CMA   Activities of Daily Living     01/02/2024    8:03 AM 08/16/2023    5:18 PM  In your present state of health, do you have any difficulty performing the following activities:  Hearing? 0 1  Vision? 0 0  Difficulty concentrating or making decisions? 0 0  Walking or climbing stairs? 0   Dressing or bathing? 0   Doing errands, shopping? 1   Comment doesn't drive, husband takes her   Preparing Food and eating ? N   Using the Toilet? N    In the past six months, have you accidently leaked urine? N   Do you have problems with loss of bowel control? N   Managing your Medications? N   Managing your Finances? N   Housekeeping or managing your Housekeeping? N     Patient Care Team: [name], NP as PCP - General (Nurse Practitioner) Antonieta Iba, MD as PCP - Cardiology (Cardiology) Lanier Prude, MD as PCP - Electrophysiology (Cardiology) Isla Pence, OD as Consulting Physician (Optometry) Salena Saner, MD as Consulting Physician (Pulmonary Disease) Lenna Gilford, [facility] (Pharmacist) Redge Gainer, RN as VBCI Care Management  Indicate any recent Medical Services you may have received from other than [facility] providers in the past year (date may be approximate).     Assessment:   This is a routine wellness examination for Karuna.  Hearing/Vision screen Hearing Screening - Comments:: Denies hearing loss Vision Screening - Comments:: Gets eye exams, Dr Julianne Rice [place]   Goals Addressed             This Visit's Progress    Patient Stated       Be more active       Depression Screen     01/02/2024    8:11 AM 08/14/2023    1:29 PM 02/28/2023    1:32 PM 12/10/2022   10:34 AM 11/16/2022    1:31 PM 09/17/2022    3:24 PM 08/21/2022    3:00 PM  PHQ 2/9 Scores  PHQ - 2 Score 0 0 0 0 4 0 0  PHQ- 9 Score 0 0 0 0 18 0 0  Fall Risk     01/02/2024    8:14 AM 02/28/2023    1:32 PM 12/10/2022   10:36 AM 11/16/2022    1:30 PM 09/17/2022    3:24 PM  Fall Risk   Falls in the past year? 0 0 1 1 0  Number falls in past yr: 0 0 1 1 0  Injury with Fall? 0 0 0 1 0  Risk for fall due to : No Fall Risks No Fall Risks History of fall(s)  No Fall Risks  Follow up Falls prevention discussed;Falls evaluation completed Falls evaluation completed Falls evaluation completed;Falls prevention discussed  Falls evaluation completed    MEDICARE RISK AT HOME:  Medicare Risk at Home Any stairs in or  around the home?: Yes If so, are there any without handrails?: No Home free of loose throw rugs in walkways, pet beds, electrical cords, etc?: Yes Adequate lighting in your home to reduce risk of falls?: Yes Life alert?: No Use of a cane, walker or w/c?: No Grab bars in the bathroom?: No Shower chair or bench in shower?: Yes Elevated toilet seat or a handicapped toilet?: Yes  TIMED UP AND GO:  Was the test performed?  No  Cognitive Function: 6CIT completed        01/02/2024    8:15 AM 12/10/2022   10:42 AM 12/02/2020    9:06 AM 10/03/2018    9:34 AM 05/13/2017    9:44 AM  6CIT Screen  What Year? 0 points 0 points 0 points 0 points 0 points  What month? 0 points 0 points 0 points 0 points 0 points  What time? 0 points 3 points 0 points 0 points 0 points  Count back from 20 0 points 0 points 0 points 0 points 0 points  Months in reverse 0 points 0 points 0 points 0 points 0 points  Repeat phrase 0 points 2 points 0 points 0 points 2 points  Total Score 0 points 5 points 0 points 0 points 2 points    Immunizations Immunization History  Administered Date(s) Administered   Fluad Quad(high Dose 65+) 07/26/2021, 07/05/2022   Fluad Trivalent(High Dose 65+) 07/23/2023   Hepatitis A, Adult 02/05/2022, 03/08/2022, 08/09/2022   Influenza, High Dose Seasonal PF 06/16/2019, 07/04/2020   Influenza, Seasonal, Injecte, Preservative Fre 08/18/2008, 07/14/2014   Influenza,inj,Quad PF,6+ Mos 09/25/2013, 08/05/2015, 08/02/2016, 07/24/2017   Moderna Covid-19 Fall Seasonal Vaccine 56yrs & older 07/23/2023   Moderna Sars-Covid-2 Vaccination 12/02/2019, 12/30/2019, 10/29/2020   Pneumococcal Conjugate-13 03/13/2018   Pneumococcal Polysaccharide-23 08/06/2019   Tdap 08/08/2009, 05/13/2014    Screening Tests Health Maintenance  Topic Date Due   Zoster Vaccines- Shingrix (1 of 2) Never done   FOOT EXAM  08/15/2023   Diabetic kidney evaluation - Urine ACR  09/18/2023   Lung Cancer Screening   11/16/2023   COVID-19 Vaccine (5 - Moderna risk 2024-25 season) 01/21/2024   OPHTHALMOLOGY EXAM  02/19/2024   MAMMOGRAM  03/19/2024   HEMOGLOBIN A1C  05/02/2024   DTaP/Tdap/Td (3 - Td or Tdap) 05/13/2024   Diabetic kidney evaluation - eGFR measurement  12/31/2024   Medicare Annual Wellness (AWV)  01/01/2025   Colonoscopy  01/05/2027   Pneumonia Vaccine 95+ Years old  Completed   INFLUENZA VACCINE  Completed   DEXA SCAN  Completed   Hepatitis C Screening  Completed   HPV VACCINES  Aged Out    Health Maintenance  Health Maintenance Due  Topic Date Due   Zoster Vaccines- Shingrix (1 of  2) Never done   FOOT EXAM  08/15/2023   Diabetic kidney evaluation - Urine ACR  09/18/2023   Lung Cancer Screening  11/16/2023   Health Maintenance Items Addressed: Mammogram ordered, DEXA ordered, See Nurse Notes  Additional Screening:  Vision Screening: Recommended annual ophthalmology exams for early detection of glaucoma and other disorders of the eye.  Dental Screening: Recommended annual dental exams for proper oral hygiene  Community Resource Referral / Chronic Care Management: CRR required this visit?  No   CCM required this visit?  No     Plan:     I have personally reviewed and noted the following in the patient's chart:   Medical and social history Use of alcohol, tobacco or illicit drugs  Current medications and supplements including opioid prescriptions. Patient is not currently taking opioid prescriptions. Functional ability and status Nutritional status Physical activity Advanced directives List of other physicians Hospitalizations, surgeries, and ER visits in previous 12 months Vitals Screenings to include cognitive, depression, and falls Referrals and appointments  In addition, I have reviewed and discussed with patient certain preventive protocols, quality metrics, and best practice recommendations. A written personalized care plan for preventive services as  well as general preventive health recommendations were provided to patient.     Tora Kindred, CMA   01/02/2024   After Visit Summary: (MyChart) Due to this being a telephonic visit, the after visit summary with patients personalized plan was offered to patient via MyChart   Notes:  Tdap due 04/2024 Needs DM foot exam at next OV on 02/12/24 Ordered MMG (due 03/20/24) and DEXA scan (ordered 2021, but patient never had done) Reminded and gave ph# to call and schedule LDCT Declined DM & Nutrition education Declined shingles vaccine

## 2024-01-02 NOTE — Patient Instructions (Addendum)
 Kim Buchanan , Thank you for taking time to come for your Medicare Wellness Visit. I appreciate your ongoing commitment to your health goals. Please review the following plan we discussed and let me know if I can assist you in the future.   Referrals/Orders/Follow-Ups/Clinician Recommendations:   I have placed an order for a mammogram (due 03/20/24) and a bone density test. Call [facility] @ [phone] to schedule. You may be able to have both tests at the same time. Please call [facility] @ [phone] to schedule a low dose lung CT scan to screen for lung cancer at your earliest convenience.  You are due for a diabetic foot exam and this can be done at your next OV on 02/12/24.  You will be due for a tetanus shot 04/2024.  This is a list of the screening recommended for you and due dates:  Health Maintenance  Topic Date Due   Zoster (Shingles) Vaccine (1 of 2) Never done   Complete foot exam   08/15/2023   Yearly kidney health urinalysis for diabetes  09/18/2023   Screening for Lung Cancer  11/16/2023   COVID-19 Vaccine (5 - Moderna risk 2024-25 season) 01/21/2024   Eye exam for diabetics  02/19/2024   Mammogram  03/19/2024   Hemoglobin A1C  05/02/2024   DTaP/Tdap/Td vaccine (3 - Td or Tdap) 05/13/2024   Yearly kidney function blood test for diabetes  12/31/2024   Medicare Annual Wellness Visit  01/01/2025   Colon Cancer Screening  01/05/2027   Pneumonia Vaccine  Completed   Flu Shot  Completed   DEXA scan (bone density measurement)  Completed   Hepatitis C Screening  Completed   HPV Vaccine  Aged Out    Advanced directives: (In Chart) A copy of your advanced directives are scanned into your chart should your provider ever need it.  Next Medicare Annual Wellness Visit scheduled for next year: Yes, 01/14/25 @ 8:00am (phone visit)

## 2024-01-15 DIAGNOSIS — Z794 Long term (current) use of insulin: Secondary | ICD-10-CM | POA: Diagnosis not present

## 2024-01-15 DIAGNOSIS — E063 Autoimmune thyroiditis: Secondary | ICD-10-CM | POA: Diagnosis not present

## 2024-01-15 DIAGNOSIS — E1165 Type 2 diabetes mellitus with hyperglycemia: Secondary | ICD-10-CM | POA: Diagnosis not present

## 2024-01-20 ENCOUNTER — Other Ambulatory Visit: Payer: Self-pay

## 2024-01-22 ENCOUNTER — Other Ambulatory Visit: Payer: Self-pay

## 2024-01-22 NOTE — Progress Notes (Unsigned)
   01/22/2024  Patient ID: Kim Buchanan, female   DOB: 01-09-1951, 73 y.o.   MRN: [id number]  Patient outreach for scheduled telephone visit unsuccessful.  Left HIPAA compliant voicemail with my direct number for patient to return my call.  If I do not hear back, I will try to call again next week.  Lenna Gilford, PharmD, DPLA

## 2024-02-05 ENCOUNTER — Other Ambulatory Visit: Payer: Self-pay | Admitting: Physician Assistant

## 2024-02-06 NOTE — Telephone Encounter (Signed)
 Requested Prescriptions  Pending Prescriptions Disp Refills   metFORMIN (GLUCOPHAGE) 1000 MG tablet [Pharmacy Med Name: metFORMIN HCl 1000 MG Oral Tablet] 200 tablet 0    Sig: TAKE 1 TABLET BY MOUTH TWICE  DAILY WITH MEALS     Endocrinology:  Diabetes - Biguanides Passed - 02/06/2024  4:40 PM      Passed - Cr in normal range and within 360 days    Creat  Date Value Ref Range Status  01/01/2018 0.82 0.50 - 0.99 mg/dL Final    Comment:    For patients >83 years of age, the reference limit for Creatinine is approximately 13% higher for people identified as African-American. .    Creatinine, Ser  Date Value Ref Range Status  01/01/2024 0.76 0.57 - 1.00 mg/dL Final         Passed - HBA1C is between 0 and 7.9 and within 180 days    Hemoglobin A1C  Date Value Ref Range Status  07/11/2023 8.0  Final    Comment:    care everywhere   A1c  Date Value Ref Range Status  11/03/2023 7.1  Final    Comment:    ABSTRACTED BY HIM         Passed - eGFR in normal range and within 360 days    GFR, Est African American  Date Value Ref Range Status  01/01/2018 88 > OR = 60 mL/min/1.47m2 Final   GFR calc Af Amer  Date Value Ref Range Status  10/17/2020 79 >59 mL/min/1.73 Final    Comment:    **In accordance with recommendations from the NKF-ASN Task force,**   [facility] is in the process of updating its eGFR calculation to the   2021 CKD-EPI creatinine equation that estimates kidney function   without a race variable.    GFR, Est Non African American  Date Value Ref Range Status  01/01/2018 76 > OR = 60 mL/min/1.35m2 Final   GFR, Estimated  Date Value Ref Range Status  11/01/2023 >60 >60 mL/min Final    Comment:    (NOTE) Calculated using the CKD-EPI Creatinine Equation (2021)    eGFR  Date Value Ref Range Status  01/01/2024 84 >59 mL/min/1.73 Final         Passed - B12 Level in normal range and within 720 days    Vitamin B-12  Date Value Ref Range Status  11/16/2022 494  232 - 1,245 pg/mL Final         Passed - Valid encounter within last 6 months    Recent Outpatient Visits   None     Future Appointments             In 6 days Aileen Alexanders, NP [facility], [facility]            Passed - CBC within normal limits and completed in the last 12 months    WBC  Date Value Ref Range Status  09/04/2023 6.2 3.4 - 10.8 x10E3/uL Final  08/17/2023 3.9 (L) 4.0 - 10.5 K/uL Final   RBC  Date Value Ref Range Status  09/04/2023 4.58 3.77 - 5.28 x10E6/uL Final  08/17/2023 3.51 (L) 3.87 - 5.11 MIL/uL Final   Hemoglobin  Date Value Ref Range Status  09/04/2023 12.5 11.1 - 15.9 g/dL Final   Hematocrit  Date Value Ref Range Status  09/04/2023 40.4 34.0 - 46.6 % Final   MCHC  Date Value Ref Range Status  09/04/2023 30.9 (L) 31.5 - 35.7 g/dL  Final  08/17/2023 30.9 30.0 - 36.0 g/dL Final   [facility]  Date Value Ref Range Status  09/04/2023 27.3 26.6 - 33.0 pg Final  08/17/2023 27.1 26.0 - 34.0 pg Final   MCV  Date Value Ref Range Status  09/04/2023 88 79 - 97 fL Final  02/24/2013 95 80 - 100 fL Final   No results found for: "PLTCOUNTKUC", "LABPLAT", "POCPLA" RDW  Date Value Ref Range Status  09/04/2023 16.6 (H) 11.7 - 15.4 % Final  02/24/2013 13.0 11.5 - 14.5 % Final

## 2024-02-10 DIAGNOSIS — E1159 Type 2 diabetes mellitus with other circulatory complications: Secondary | ICD-10-CM | POA: Diagnosis not present

## 2024-02-10 DIAGNOSIS — Z794 Long term (current) use of insulin: Secondary | ICD-10-CM | POA: Diagnosis not present

## 2024-02-10 DIAGNOSIS — E063 Autoimmune thyroiditis: Secondary | ICD-10-CM | POA: Diagnosis not present

## 2024-02-10 DIAGNOSIS — E785 Hyperlipidemia, unspecified: Secondary | ICD-10-CM | POA: Diagnosis not present

## 2024-02-10 DIAGNOSIS — I152 Hypertension secondary to endocrine disorders: Secondary | ICD-10-CM | POA: Diagnosis not present

## 2024-02-10 DIAGNOSIS — E1165 Type 2 diabetes mellitus with hyperglycemia: Secondary | ICD-10-CM | POA: Diagnosis not present

## 2024-02-10 DIAGNOSIS — E1169 Type 2 diabetes mellitus with other specified complication: Secondary | ICD-10-CM | POA: Diagnosis not present

## 2024-02-12 ENCOUNTER — Encounter: Payer: Self-pay | Admitting: Nurse Practitioner

## 2024-02-12 ENCOUNTER — Ambulatory Visit
Admission: RE | Admit: 2024-02-12 | Discharge: 2024-02-12 | Disposition: A | Discharge status: Home / Self Care | Source: Ambulatory Visit | Attending: Nurse Practitioner | Admitting: Nurse Practitioner

## 2024-02-12 ENCOUNTER — Ambulatory Visit: Payer: Self-pay | Admitting: Nurse Practitioner

## 2024-02-12 ENCOUNTER — Ambulatory Visit
Admission: RE | Admit: 2024-02-12 | Discharge: 2024-02-12 | Disposition: A | Discharge status: Home / Self Care | Attending: Nurse Practitioner | Admitting: Nurse Practitioner

## 2024-02-12 VITALS — BP 139/83 | HR 80 | Temp 97.4°F | Wt 218.4 lb

## 2024-02-12 DIAGNOSIS — E785 Hyperlipidemia, unspecified: Secondary | ICD-10-CM | POA: Diagnosis not present

## 2024-02-12 DIAGNOSIS — I700 Atherosclerosis of aorta: Secondary | ICD-10-CM | POA: Diagnosis not present

## 2024-02-12 DIAGNOSIS — M545 Low back pain, unspecified: Secondary | ICD-10-CM

## 2024-02-12 DIAGNOSIS — I420 Dilated cardiomyopathy: Secondary | ICD-10-CM

## 2024-02-12 DIAGNOSIS — G8929 Other chronic pain: Secondary | ICD-10-CM | POA: Diagnosis not present

## 2024-02-12 DIAGNOSIS — Z794 Long term (current) use of insulin: Secondary | ICD-10-CM

## 2024-02-12 DIAGNOSIS — I152 Hypertension secondary to endocrine disorders: Secondary | ICD-10-CM | POA: Diagnosis not present

## 2024-02-12 DIAGNOSIS — J410 Simple chronic bronchitis: Secondary | ICD-10-CM | POA: Diagnosis not present

## 2024-02-12 DIAGNOSIS — E1169 Type 2 diabetes mellitus with other specified complication: Secondary | ICD-10-CM | POA: Diagnosis not present

## 2024-02-12 DIAGNOSIS — E1142 Type 2 diabetes mellitus with diabetic polyneuropathy: Secondary | ICD-10-CM

## 2024-02-12 DIAGNOSIS — E1159 Type 2 diabetes mellitus with other circulatory complications: Secondary | ICD-10-CM | POA: Diagnosis not present

## 2024-02-12 DIAGNOSIS — M4187 Other forms of scoliosis, lumbosacral region: Secondary | ICD-10-CM | POA: Diagnosis not present

## 2024-02-12 DIAGNOSIS — M47816 Spondylosis without myelopathy or radiculopathy, lumbar region: Secondary | ICD-10-CM | POA: Diagnosis not present

## 2024-02-12 DIAGNOSIS — Z0000 Encounter for general adult medical examination without abnormal findings: Secondary | ICD-10-CM

## 2024-02-12 DIAGNOSIS — J4550 Severe persistent asthma, uncomplicated: Secondary | ICD-10-CM

## 2024-02-12 LAB — MICROALBUMIN, URINE WAIVED
Creatinine, Urine Waived: 50 mg/dL (ref 10–300)
Microalb, Ur Waived: 10 mg/L (ref 0–19)

## 2024-02-12 MED ORDER — KETOROLAC TROMETHAMINE 60 MG/2ML IM SOLN
60.0000 mg | Freq: Once | INTRAMUSCULAR | Status: AC
  Administered 2024-02-12: 60 mg via INTRAMUSCULAR

## 2024-02-12 MED ORDER — GABAPENTIN 400 MG PO CAPS: 400.0000 mg | ORAL_CAPSULE | Freq: Three times a day (TID) | ORAL | 1 refills | Status: DC

## 2024-02-12 NOTE — Assessment & Plan Note (Signed)
Chronic.  Controlled.  Continue with current medication regimen.  Labs ordered today.  Return to clinic in 3 months for reevaluation.  Call sooner if concerns arise.   

## 2024-02-12 NOTE — Assessment & Plan Note (Signed)
 Chronic.  Not as well controlled.  Last A1c was 9.8%.  Will continue to let Endocrinology manage medications.  No longer on Ozempic due to hospitalization in October. Continue with long and short acting insulin .  Follow up in 3 months.  Call sooner if concerns arise.

## 2024-02-12 NOTE — Assessment & Plan Note (Signed)
 Observed on CT in 2021.  Continue with ASA and Atorvastatin .  Labs ordered.

## 2024-02-12 NOTE — Assessment & Plan Note (Signed)
Chronic.  Controlled.  Continue with current medication regimen.  Followed by Cardiology.  Labs ordered today.  Return to clinic in 6 months for reevaluation.  Call sooner if concerns arise.

## 2024-02-12 NOTE — Assessment & Plan Note (Signed)
 Chronic.  Controlled.  Continue with current medication regimen.  Labs ordered today.  Return to clinic in 6 months for reevaluation.  Call sooner if concerns arise.  ? ?

## 2024-02-12 NOTE — Assessment & Plan Note (Signed)
 Recommended eating smaller high protein, low fat meals more frequently and exercising 30 mins a day 5 times a week with a goal of 10-15lb weight loss in the next 3 months.

## 2024-02-12 NOTE — Assessment & Plan Note (Signed)
Chronic.  Controlled.  Continue with current medication regimen on Atorvastatin 10mg.  Labs ordered today.  Return to clinic in 3 months for reevaluation.  Call sooner if concerns arise.   

## 2024-02-12 NOTE — Assessment & Plan Note (Addendum)
 Chronic. Controlled.  Recommend checking blood pressures at home. If <100/70s, call the office.  Follow up in 6 months.  Labs ordered at visit today.

## 2024-02-12 NOTE — Progress Notes (Signed)
 BP 139/83   Pulse 80   Temp (!) 97.4 F (36.3 C) (Oral)   Wt 218 lb 6.4 oz (99.1 kg)   SpO2 95%   BMI 38.69 kg/m    Subjective:    Patient ID: Kim Buchanan, female    DOB: 1951-02-18, 73 y.o.   MRN: [id number]  HPI: Kim Buchanan is a 73 y.o. female presenting on 02/12/2024 for comprehensive medical examination. Current medical complaints include: back pain  She currently lives with: Menopausal Symptoms: no  HYPERTENSION / HYPERLIPIDEMIA Continues to follow up with Cardiology.  Doing well with current medication regimen. Satisfied with current treatment? yes Duration of hypertension: years BP monitoring frequency: a few times a month BP range:  BP medication side effects: no Past BP meds: metoprolol  and valsartan  Duration of hyperlipidemia: years Cholesterol medication side effects: no Cholesterol supplements: none Past cholesterol medications: atorvastain (lipitor) Medication compliance: excellent compliance Aspirin : yes Recent stressors: no Recurrent headaches: no Visual changes: no Palpitations: no Dyspnea: yes Chest pain: no Lower extremity edema: no Dizzy/lightheaded: no  DIABETES Patient has established care with Endocrinology. Patient assistance has been completed for Ozempic.  She is going to be increased from 1mg  to 2mg  of Ozempic.  Last A1c last week was 9.8%  Continued on Long and Short acting insulin .  Now using freestyle libre. Hypoglycemic episodes:no Polydipsia/polyuria: no Visual disturbance: no Chest pain: no Paresthesias: no Glucose Monitoring: yes  Accucheck frequency: in the morning  Fasting glucose: 230s  Post prandial:  Evening:  Before meals: Taking Insulin ?: yes  Long acting insulin : Tresiba  100u  Short acting insulin : Humalog  12 TID Blood Pressure Monitoring: a few times a month Retinal Examination: Up to Date Foot Exam: Up to Date Diabetic Education: Not Completed Pneumovax: Up to Date Influenza: Up to Date Aspirin :  yes  Patient states she has been having some back pain.  She was seeing a [facility].  She was taking Tizaidine but it caused her to have nightmares.  Pain has been ongoing for many years but it has recently been worse.  Pain is in her lower back.  She is still taking the Gabapentin  but it isn't helping her.     Depression Screen done today and results listed below:     02/12/2024    8:43 AM 01/02/2024    8:11 AM 08/14/2023    1:29 PM 02/28/2023    1:32 PM 12/10/2022   10:34 AM  Depression screen PHQ 2/9  Decreased Interest 0 0 0 0 0  Down, Depressed, Hopeless 0 0 0 0 0  PHQ - 2 Score 0 0 0 0 0  Altered sleeping 0 0 0 0 0  Tired, decreased energy 0 0 0 0 0  Change in appetite 0 0 0 0 0  Feeling bad or failure about yourself  0 0 0 0 0  Trouble concentrating 0 0 0 0 0  Moving slowly or fidgety/restless 0 0 0 0 0  Suicidal thoughts 0 0 0 0 0  PHQ-9 Score 0 0 0 0 0  Difficult doing work/chores Not difficult at all Not difficult at all       The patient does not have a history of falls. I did complete a risk assessment for falls. A plan of care for falls was documented.   Past Medical History:  Past Medical History:  Diagnosis Date   Anxiety    Arthritis    lower back   COPD (chronic obstructive pulmonary disease) (HCC)  Depression    Diabetes mellitus without complication (HCC)    GERD (gastroesophageal reflux disease)    Hyperlipidemia    Morbid obesity (HCC)    NICM (nonischemic cardiomyopathy) (HCC)    a. 03/2019 Echo: Nl LVEF; b. 09/04/2019 Echo: EF 25-30%; c. 09/11/2019 Cath: nonobs LAD/RCA dzs. EF 50-55%; c. 12/2020 Echo: EF 30-35%, glob HK. Mild LVH. Nl RVSP. Mild MR; d. 02/2021 RHC: Elev RH pressures. CO/CI 2.8/1.3; e. 06/2021 cMRI: EF 40%, marked sept-lat dyssynch & septal HK. Small, discrete areas of mid-wall LGE in apical lateral and apical septal walls - ? h/o myocarditis.   Nonobstructive CAD (coronary artery disease)    a. 03/2019 [facility]): EF 63%, ant ischemia; b.  08/2019 Cath: LM nl, LAD 24m, RCA 70p (iFR nl @ 0.97), 50p. EF 50-55%-->Med Rx; c. 06/2020 Cor CTA: Nl FFRs throughout cor tree.   Osteoporosis    Panic attack    PTSD (post-traumatic stress disorder)    PVC's (premature ventricular contractions)    a. 05/2021 Zio: 20.6% PVC burden. 13.4 secs of NSVT-->amio added; b. 06/2021 Zio: 5.8% PVC burden. Rare supraventrciular ectopy.   Rectal fistula    Sleep apnea    a. Prev wore CPAP but lost insurance and hasn't been using since (now has insurance).   Wears dentures    full upper    Surgical History:  Past Surgical History:  Procedure Laterality Date   CARDIAC CATHETERIZATION     COLONOSCOPY  2014   COLONOSCOPY WITH PROPOFOL  N/A 10/01/2016   Procedure: COLONOSCOPY WITH PROPOFOL ;  Surgeon: Marnee Sink, MD;  [facility];  Service: Endoscopy;  Laterality: N/A;   COLONOSCOPY WITH PROPOFOL  N/A 01/04/2022   Procedure: COLONOSCOPY WITH PROPOFOL ;  Surgeon: Marnee Sink, MD;  Location: [facility] ENDOSCOPY;  Service: Endoscopy;  Laterality: N/A;   CORONARY PRESSURE/FFR WITH 3D MAPPING N/A 09/11/2019   Procedure: Coronary Pressure Wire/FFR w/3D Mapping;  Surgeon: Sammy Crisp, MD;  Location: [facility] INVASIVE CV LAB;  Service: Cardiovascular;  Laterality: N/A;   CYSTOCELE REPAIR N/A 12/17/2016   Procedure: ANTERIOR REPAIR (CYSTOCELE);  Surgeon: Colan Dash, MD;  Location: [facility] ORS;  Service: Gynecology;  Laterality: N/A;   ESOPHAGOGASTRODUODENOSCOPY (EGD) WITH PROPOFOL  N/A 08/11/2019   Procedure: ESOPHAGOGASTRODUODENOSCOPY (EGD) WITH PROPOFOL ;  Surgeon: Marnee Sink, MD;  Location: [facility] ENDOSCOPY;  Service: Endoscopy;  Laterality: N/A;   POLYPECTOMY  10/01/2016   Procedure: POLYPECTOMY;  Surgeon: Marnee Sink, MD;  Location: [facility];  Service: Endoscopy;;   RECTAL SURGERY  06/28/2015   Rectal prolapse, laparoscopic rectopexy the coldCharmont, MD; [facility]   RIGHT HEART CATH N/A 03/21/2021   Procedure: RIGHT HEART CATH;   Surgeon: Sammy Crisp, MD;  Location: [facility] INVASIVE CV LAB;  Service: Cardiovascular;  Laterality: N/A;   RIGHT/LEFT HEART CATH AND CORONARY ANGIOGRAPHY Bilateral 09/11/2019   Procedure: RIGHT/LEFT HEART CATH AND CORONARY ANGIOGRAPHY;  Surgeon: Devorah Fonder, MD;  Location: [facility] INVASIVE CV LAB;  Service: Cardiovascular;  Laterality: Bilateral;   TOTAL ABDOMINAL HYSTERECTOMY W/ BILATERAL SALPINGOOPHORECTOMY     TUBAL LIGATION     VAGINAL HYSTERECTOMY Bilateral 12/17/2016   Procedure: HYSTERECTOMY VAGINAL WITH BILATERAL SALPINGO OOPHERECTOMY;  Surgeon: Colan Dash, MD;  Location: [facility] ORS;  Service: Gynecology;  Laterality: Bilateral;    Medications:  Current Outpatient Medications on File Prior to Visit  Medication Sig   amiodarone  (PACERONE ) 200 MG tablet Take 0.5 tablets (100 mg total) by mouth daily.   aspirin  EC 81 MG tablet Take 1 tablet (81 mg total)  by mouth daily. Swallow whole.   Continuous Glucose Sensor (FREESTYLE LIBRE 2 SENSOR) MISC 1 each by Does not apply route every 14 (fourteen) days.   insulin  degludec (TRESIBA  FLEXTOUCH) 200 UNIT/ML FlexTouch Pen Reduce from 100 units to 70 units.   insulin  lispro (HUMALOG  KWIKPEN) 100 UNIT/ML KwikPen Inject 7 Units into the skin 3 (three) times daily. With meals.   Insulin  Pen Needle 30G X 6 MM MISC 1 Application by Does not apply route daily.   Lancets (ONETOUCH DELICA PLUS LANCET30G) MISC TEST ONCE DAILY   levothyroxine  (SYNTHROID ) 75 MCG tablet Take 1 tablet (75 mcg total) by mouth daily before breakfast.   metFORMIN  (GLUCOPHAGE ) 1000 MG tablet TAKE 1 TABLET BY MOUTH TWICE  DAILY WITH MEALS   metoprolol  succinate (TOPROL -XL) 25 MG 24 hr tablet TAKE 1 TABLET BY MOUTH DAILY   ONETOUCH ULTRA test strip TEST ONCE DAILY   pantoprazole  (PROTONIX ) 40 MG tablet Take 1 tablet (40 mg total) by mouth daily.   polyethylene glycol powder (GLYCOLAX /MIRALAX ) 17 GM/SCOOP powder Take 17 g by mouth daily.   QUEtiapine  (SEROQUEL ) 200 MG  tablet TAKE 1 TABLET BY MOUTH DAILY AT  BEDTIME   spironolactone  (ALDACTONE ) 25 MG tablet Take 0.5 tablets (12.5 mg total) by mouth daily.   Continuous Blood Gluc Receiver (FREESTYLE LIBRE 2 READER) DEVI 1 each by Does not apply route daily. (Patient not taking: Reported on 01/02/2024)   nitroGLYCERIN  (NITROSTAT ) 0.3 MG SL tablet Place 1 tablet (0.3 mg total) under the tongue every 5 (five) minutes as needed for chest pain.   No current facility-administered medications on file prior to visit.    Allergies:  Allergies  Allergen Reactions   Entresto  [Sacubitril-Valsartan ]     Cough    Lisinopril Cough    Social History:  Social History   Socioeconomic History   Marital status: Married    Spouse name: Solicitor   Number of children: 1   Years of education: Not on file   Highest education level: 9th grade  Occupational History    Employer: DISABLED    Comment: for panic attacks  Tobacco Use   Smoking status: Former    Current packs/day: 0.00    Average packs/day: 1.5 packs/day for 50.0 years (75.0 ttl pk-yrs)    Types: Cigarettes    Start date: 11/29/1968    Quit date: 11/29/2018    Years since quitting: 5.2   Smokeless tobacco: Never   Tobacco comments:    started at age 58  Vaping Use   Vaping status: Never Used  Substance and Sexual Activity   Alcohol use: No    Alcohol/week: 0.0 standard drinks of alcohol   Drug use: No   Sexual activity: Not Currently  Other Topics Concern   Not on file  Social History Narrative   Pt had one son, who died when was 89 YO in a MVA.   Social Drivers of Corporate investment banker Strain: Low Risk  (01/02/2024)   Overall Financial Resource Strain (CARDIA)    Difficulty of Paying Living Expenses: Not hard at all  Food Insecurity: No Food Insecurity (01/02/2024)   Hunger Vital Sign    Worried About Running Out of Food in the Last Year: Never true    Ran Out of Food in the Last Year: Never true  Transportation Needs: No Transportation  Needs (01/02/2024)   PRAPARE - Administrator, Civil Service (Medical): No    Lack of Transportation (Non-Medical): No  Physical  Activity: Inactive (01/02/2024)   Exercise Vital Sign    Days of Exercise per Week: 0 days    Minutes of Exercise per Session: 0 min  Stress: No Stress Concern Present (01/02/2024)   [employer] of Occupational Health - Occupational Stress Questionnaire    Feeling of Stress : Not at all  Social Connections: Socially Isolated (01/02/2024)   Social Connection and Isolation Panel [NHANES]    Frequency of Communication with Friends and Family: Once a week    Frequency of Social Gatherings with Friends and Family: Once a week    Attends Religious Services: Never    Database administrator or Organizations: No    Attends Banker Meetings: Never    Marital Status: Married  Catering manager Violence: Not At Risk (01/02/2024)   Humiliation, Afraid, Rape, and Kick questionnaire    Fear of Current or Ex-Partner: No    Emotionally Abused: No    Physically Abused: No    Sexually Abused: No   Social History   Tobacco Use  Smoking Status Former   Current packs/day: 0.00   Average packs/day: 1.5 packs/day for 50.0 years (75.0 ttl pk-yrs)   Types: Cigarettes   Start date: 11/29/1968   Quit date: 11/29/2018   Years since quitting: 5.2  Smokeless Tobacco Never  Tobacco Comments   started at age 61   Social History   Substance and Sexual Activity  Alcohol Use No   Alcohol/week: 0.0 standard drinks of alcohol    Family History:  Family History  Problem Relation Age of Onset   Diabetes Mother    Diabetes Sister    Cancer Sister    Diabetes Brother    Lymphoma Brother    Heart disease Father    Hypertension Father    Diabetes Sister    Diabetes Brother    Lymphoma Brother    Heart disease Brother    Healthy Sister    Breast cancer Neg Hx    Ovarian cancer Neg Hx    Colon cancer Neg Hx     Past medical history, surgical  history, medications, allergies, family history and social history reviewed with patient today and changes made to appropriate areas of the chart.   Review of Systems  HENT:         Denies vision changes.  Eyes:  Negative for blurred vision and double vision.  Respiratory:  Negative for shortness of breath.   Cardiovascular:  Negative for chest pain, palpitations and leg swelling.  Musculoskeletal:  Positive for back pain.  Neurological:  Negative for dizziness, tingling and headaches.  Endo/Heme/Allergies:  Negative for polydipsia.       Denies Polyuria   All other ROS negative except what is listed above and in the HPI.      Objective:    BP 139/83   Pulse 80   Temp (!) 97.4 F (36.3 C) (Oral)   Wt 218 lb 6.4 oz (99.1 kg)   SpO2 95%   BMI 38.69 kg/m   Wt Readings from Last 3 Encounters:  02/12/24 218 lb 6.4 oz (99.1 kg)  01/02/24 213 lb (96.6 kg)  01/01/24 213 lb 12.8 oz (97 kg)    Physical Exam Vitals and nursing note reviewed.  Constitutional:      General: She is awake. She is not in acute distress.    Appearance: Normal appearance. She is well-developed. She is obese. She is not ill-appearing.  HENT:     Head: Normocephalic and atraumatic.  Right Ear: Hearing, tympanic membrane, ear canal and external ear normal. No drainage.     Left Ear: Hearing, tympanic membrane, ear canal and external ear normal. No drainage.     Nose: Nose normal.     Right Sinus: No maxillary sinus tenderness or frontal sinus tenderness.     Left Sinus: No maxillary sinus tenderness or frontal sinus tenderness.     Mouth/Throat:     Mouth: Mucous membranes are moist.     Pharynx: Oropharynx is clear. Uvula midline. No pharyngeal swelling, oropharyngeal exudate or posterior oropharyngeal erythema.  Eyes:     General: Lids are normal.        Right eye: No discharge.        Left eye: No discharge.     Extraocular Movements: Extraocular movements intact.     Conjunctiva/sclera:  Conjunctivae normal.     Pupils: Pupils are equal, round, and reactive to light.     Visual Fields: Right eye visual fields normal and left eye visual fields normal.  Neck:     Thyroid : No thyromegaly.     Vascular: No carotid bruit.     Trachea: Trachea normal.  Cardiovascular:     Rate and Rhythm: Normal rate and regular rhythm.     Heart sounds: Normal heart sounds. No murmur heard.    No gallop.  Pulmonary:     Effort: Pulmonary effort is normal. No accessory muscle usage or respiratory distress.     Breath sounds: Normal breath sounds.  Chest:  Breasts:    Right: Normal.     Left: Normal.  Abdominal:     General: Bowel sounds are normal.     Palpations: Abdomen is soft. There is no hepatomegaly or splenomegaly.     Tenderness: There is no abdominal tenderness.  Musculoskeletal:        General: Normal range of motion.     Cervical back: Normal range of motion and neck supple.     Right lower leg: No edema.     Left lower leg: No edema.  Lymphadenopathy:     Head:     Right side of head: No submental, submandibular, tonsillar, preauricular or posterior auricular adenopathy.     Left side of head: No submental, submandibular, tonsillar, preauricular or posterior auricular adenopathy.     Cervical: No cervical adenopathy.     Upper Body:     Right upper body: No supraclavicular, axillary or pectoral adenopathy.     Left upper body: No supraclavicular, axillary or pectoral adenopathy.  Skin:    General: Skin is warm and dry.     Capillary Refill: Capillary refill takes less than 2 seconds.     Findings: No rash.  Neurological:     Mental Status: She is alert and oriented to person, place, and time.     Gait: Gait is intact.  Psychiatric:        Attention and Perception: Attention normal.        Mood and Affect: Mood normal.        Speech: Speech normal.        Behavior: Behavior normal. Behavior is cooperative.        Thought Content: Thought content normal.         Judgment: Judgment normal.     Results for orders placed or performed in visit on 01/01/24  Comprehensive metabolic panel   Collection Time: 01/01/24  9:33 AM  Result Value Ref Range   Glucose 221 (H)  70 - 99 mg/dL   BUN 15 8 - 27 mg/dL   Creatinine, Ser 1.61 0.57 - 1.00 mg/dL   eGFR 84 >09 UE/AVW/0.98   BUN/Creatinine Ratio 20 12 - 28   Sodium 140 134 - 144 mmol/L   Potassium 4.7 3.5 - 5.2 mmol/L   Chloride 103 96 - 106 mmol/L   CO2 18 (L) 20 - 29 mmol/L   Calcium  8.9 8.7 - 10.3 mg/dL   Total Protein 7.2 6.0 - 8.5 g/dL   Albumin 4.0 3.9 - 4.9 g/dL   Globulin, Total 3.2 1.5 - 4.5 g/dL   Bilirubin Total 0.7 0.0 - 1.2 mg/dL   Alkaline Phosphatase 187 (H) 44 - 121 IU/L   AST 65 (H) 0 - 40 IU/L   ALT 58 (H) 0 - 32 IU/L      Assessment & Plan:   Problem List Items Addressed This Visit       Cardiovascular and Mediastinum   Hypertension associated with diabetes (HCC)   Chronic. Controlled.  Recommend checking blood pressures at home. If <100/70s, call the office.  Follow up in 6 months.  Labs ordered at visit today.        Relevant Orders   Comprehensive metabolic panel with GFR   Dilated cardiomyopathy (HCC)   Chronic.  Controlled.  Continue with current medication regimen.  Followed by Cardiology.  Labs ordered today.  Return to clinic in 6 months for reevaluation.  Call sooner if concerns arise.        Aortic atherosclerosis (HCC)   Observed on CT in 2021.  Continue with ASA and Atorvastatin .  Labs ordered.          Respiratory   COPD (chronic obstructive pulmonary disease) (HCC)   Chronic.  Controlled.  Continue with current medication regimen.  Labs ordered today.  Return to clinic in 3 months for reevaluation.  Call sooner if concerns arise.       Severe persistent asthma, unspecified whether complicated   Chronic.  Controlled.  Continue with current medication regimen.  Labs ordered today.  Return to clinic in 6 months for reevaluation.  Call sooner if concerns  arise.          Endocrine   Hyperlipidemia associated with type 2 diabetes mellitus (HCC)   Chronic.  Controlled.  Continue with current medication regimen on Atorvastatin  10mg .  Labs ordered today.  Return to clinic in 3 months for reevaluation.  Call sooner if concerns arise.       Relevant Orders   Lipid panel   Type 2 diabetes mellitus with peripheral neuropathy (HCC)   Chronic.  Not as well controlled.  Last A1c was 9.8%.  Will continue to let Endocrinology manage medications.  No longer on Ozempic due to hospitalization in October. Continue with long and short acting insulin .  Follow up in 3 months.  Call sooner if concerns arise.       Relevant Medications   gabapentin  (NEURONTIN ) 400 MG capsule   Other Relevant Orders   Microalbumin, Urine Waived     Other   Morbid obesity (HCC)   Recommended eating smaller high protein, low fat meals more frequently and exercising 30 mins a day 5 times a week with a goal of 10-15lb weight loss in the next 3 months.       Encounter for long-term (current) use of insulin  [facility])   Other Visit Diagnoses       Annual physical exam    -  Primary  Health maintenance reviewed. Labs ordered.  Vaccines reviewed.  Mammogram and Colonoscopy up to date.   Relevant Orders   CBC with Differential/Platelet   Comprehensive metabolic panel with GFR   Lipid panel   TSH   Microalbumin, Urine Waived     Lumbar back pain       Ongoing problem but recently worsened. Toradol  given in office. Will obtain xrays and make recommendations based on results. Gabapentin  dose increased.   Relevant Medications   ketorolac  (TORADOL ) injection 60 mg (Completed)   Other Relevant Orders   DG Lumbar Spine Complete        Follow up plan: Return in about 3 months (around 05/13/2024) for HTN, HLD, DM2 FU.   LABORATORY TESTING:  - Pap smear: not applicable  IMMUNIZATIONS:   - Tdap: Tetanus vaccination status reviewed: last tetanus booster within 10 years. -  Influenza: Postponed to flu season - Pneumovax: Up to date - Prevnar: Up to date - COVID: Not applicable - HPV: Not applicable - Shingrix vaccine:  Discussed at visit today  SCREENING: -Mammogram: Up to date  - Colonoscopy: Up to date  - Bone Density: Not applicable  -Hearing Test: Not applicable  -Spirometry: Not applicable   PATIENT COUNSELING:   Advised to take 1 mg of folate supplement per day if capable of pregnancy.   Sexuality: Discussed sexually transmitted diseases, partner selection, use of condoms, avoidance of unintended pregnancy  and contraceptive alternatives.   Advised to avoid cigarette smoking.  I discussed with the patient that most people either abstain from alcohol or drink within safe limits (<=14/week and <=4 drinks/occasion for males, <=7/weeks and <= 3 drinks/occasion for females) and that the risk for alcohol disorders and other health effects rises proportionally with the number of drinks per week and how often a drinker exceeds daily limits.  Discussed cessation/primary prevention of drug use and availability of treatment for abuse.   Diet: Encouraged to adjust caloric intake to maintain  or achieve ideal body weight, to reduce intake of dietary saturated fat and total fat, to limit sodium intake by avoiding high sodium foods and not adding table salt, and to maintain adequate dietary potassium and calcium  preferably from fresh fruits, vegetables, and low-fat dairy products.    stressed the importance of regular exercise  Injury prevention: Discussed safety belts, safety helmets, smoke detector, smoking near bedding or upholstery.   Dental health: Discussed importance of regular tooth brushing, flossing, and dental visits.    NEXT PREVENTATIVE PHYSICAL DUE IN 1 YEAR. Return in about 3 months (around 05/13/2024) for HTN, HLD, DM2 FU.

## 2024-02-13 ENCOUNTER — Encounter: Payer: Self-pay | Admitting: Nurse Practitioner

## 2024-02-13 LAB — COMPREHENSIVE METABOLIC PANEL WITH GFR
ALT: 82 IU/L — ABNORMAL HIGH (ref 0–32)
AST: 84 IU/L — ABNORMAL HIGH (ref 0–40)
Albumin: 4.4 g/dL (ref 3.8–4.8)
Alkaline Phosphatase: 212 IU/L — ABNORMAL HIGH (ref 44–121)
BUN/Creatinine Ratio: 24 (ref 12–28)
BUN: 15 mg/dL (ref 8–27)
Bilirubin Total: 0.8 mg/dL (ref 0.0–1.2)
CO2: 20 mmol/L (ref 20–29)
Calcium: 9.1 mg/dL (ref 8.7–10.3)
Chloride: 101 mmol/L (ref 96–106)
Creatinine, Ser: 0.63 mg/dL (ref 0.57–1.00)
Globulin, Total: 3 g/dL (ref 1.5–4.5)
Glucose: 178 mg/dL — ABNORMAL HIGH (ref 70–99)
Potassium: 4.5 mmol/L (ref 3.5–5.2)
Sodium: 142 mmol/L (ref 134–144)
Total Protein: 7.4 g/dL (ref 6.0–8.5)
eGFR: 95 mL/min/{1.73_m2} (ref >59)

## 2024-02-13 LAB — CBC WITH DIFFERENTIAL/PLATELET
Basophils Absolute: 0 10*3/uL (ref 0.0–0.2)
Basos: 1 %
EOS (ABSOLUTE): 0 10*3/uL (ref 0.0–0.4)
Eos: 1 %
Hematocrit: 39.2 % (ref 34.0–46.6)
Hemoglobin: 12.5 g/dL (ref 11.1–15.9)
Immature Grans (Abs): 0 10*3/uL (ref 0.0–0.1)
Immature Granulocytes: 0 %
Lymphocytes Absolute: 1.3 10*3/uL (ref 0.7–3.1)
Lymphs: 26 %
MCH: 28.3 pg (ref 26.6–33.0)
MCHC: 31.9 g/dL (ref 31.5–35.7)
MCV: 89 fL (ref 79–97)
Monocytes Absolute: 0.3 10*3/uL (ref 0.1–0.9)
Monocytes: 5 %
Neutrophils Absolute: 3.2 10*3/uL (ref 1.4–7.0)
Neutrophils: 67 %
Platelets: 165 10*3/uL (ref 150–450)
RBC: 4.41 x10E6/uL (ref 3.77–5.28)
RDW: 15 % (ref 11.7–15.4)
WBC: 4.8 10*3/uL (ref 3.4–10.8)

## 2024-02-13 LAB — LIPID PANEL
Chol/HDL Ratio: 2.2 ratio (ref 0.0–4.4)
Cholesterol, Total: 86 mg/dL — ABNORMAL LOW (ref 100–199)
HDL: 39 mg/dL — ABNORMAL LOW (ref >39)
LDL Chol Calc (NIH): 27 mg/dL (ref 0–99)
Triglycerides: 103 mg/dL (ref 0–149)
VLDL Cholesterol Cal: 20 mg/dL (ref 5–40)

## 2024-02-13 LAB — TSH: TSH: 3.11 u[IU]/mL (ref 0.450–4.500)

## 2024-02-17 ENCOUNTER — Telehealth: Payer: Self-pay

## 2024-02-17 NOTE — Telephone Encounter (Unsigned)
 Copied from CRM [id number]. Topic: Clinical - Lab/Test Results >> Feb 17, 2024 11:24 AM Crispin Dolphin wrote: Reason for CRM: Patient called. Has not heard back about her x-ray results. Per chart results not yet finalized. Pt would like a call back with results when available. Thank You

## 2024-02-19 ENCOUNTER — Telehealth: Payer: Self-pay

## 2024-02-19 ENCOUNTER — Encounter: Payer: Self-pay | Admitting: Nurse Practitioner

## 2024-02-19 NOTE — Telephone Encounter (Signed)
 Contacted the radiology department to have imaging read ASAP. Routing to provider so she aware results should be coming soon.

## 2024-02-19 NOTE — Telephone Encounter (Signed)
 Results have been received and sent to the patient via mychart.

## 2024-02-20 DIAGNOSIS — H2513 Age-related nuclear cataract, bilateral: Secondary | ICD-10-CM | POA: Diagnosis not present

## 2024-02-20 DIAGNOSIS — E119 Type 2 diabetes mellitus without complications: Secondary | ICD-10-CM | POA: Diagnosis not present

## 2024-02-20 LAB — HM DIABETES EYE EXAM

## 2024-02-20 MED ORDER — CYCLOBENZAPRINE HCL 5 MG PO TABS: 5.0000 mg | ORAL_TABLET | Freq: Three times a day (TID) | ORAL | 1 refills | Status: DC | PRN

## 2024-02-20 NOTE — Addendum Note (Signed)
 Addended by: Aileen Alexanders on: 02/20/2024 07:53 AM   Modules accepted: Orders

## 2024-03-09 ENCOUNTER — Telehealth: Payer: Self-pay | Admitting: Nurse Practitioner

## 2024-03-09 MED ORDER — CYCLOBENZAPRINE HCL 10 MG PO TABS: 10.0000 mg | ORAL_TABLET | Freq: Three times a day (TID) | ORAL | 1 refills | Status: DC | PRN

## 2024-03-09 NOTE — Telephone Encounter (Signed)
 Patient came in with her husband and states the cyclobenzaprine  is helping but asked for it to be increased.

## 2024-03-19 ENCOUNTER — Telehealth: Payer: Self-pay | Admitting: *Deleted

## 2024-03-19 NOTE — Progress Notes (Signed)
 Complex Care Management Care Guide Note  03/19/2024 Name: Kim Buchanan MRN: [id number] DOB: 19-Feb-1951  Kim Buchanan is a 73 y.o. year old female who is a primary care patient of Aileen Alexanders, NP and is actively engaged with the care management team. I reached out to Chandler Combs by phone today to assist with re-scheduling  with the RN Case Manager.  Follow up plan: Unsuccessful telephone outreach attempt made. A HIPAA compliant phone message was left for the patient providing contact information and requesting a return call.  Kandis Ormond, CMA [place]  [facility], [facility] Guide Direct Dial : [phone]  Fax: [phone] Website: [email or web address]

## 2024-03-27 NOTE — Progress Notes (Signed)
 Complex Care Management Care Guide Note  03/27/2024 Name: Kim Buchanan MRN: [id number] DOB: 28-Jan-1951  Kim Buchanan is a 73 y.o. year old female who is a primary care patient of Aileen Alexanders, NP and is actively engaged with the care management team. I reached out to Chandler Combs by phone today to assist with re-scheduling  with the Pharmacist.  Follow up plan: Telephone appointment with complex care management team member scheduled for:  04/03/2024  Kandis Ormond, CMA [place]  [facility], [facility] Guide Direct Dial : [phone]  Fax: [phone] Website: [email or web address]

## 2024-04-03 ENCOUNTER — Other Ambulatory Visit: Payer: Self-pay

## 2024-04-03 NOTE — Progress Notes (Signed)
   04/03/2024  Patient ID: Kim Buchanan, female   DOB: 30-Sep-1951, 73 y.o.   MRN: [id number]  Subjective/Objective Telephone visit to follow-up on management of diabetes  Diabetes -Current medications:  Tresiba  80 units HS, Humalog  11 units TID with meals skipping if pre-prandial BG <100, metformin  1000mg  BID -Diabetes is managed by [facility] -Ozempic stopped during October hospitalization and patient refuses to resume therapy  -Insulin  recently increased by endocrinology based on elevated A1c at 9.8% -Patient is using Libre 2 for CGM but endorses only scanning sensor for reading 1-2 times daily -Endorses average BG in the 200's -Patient receives Tresiba  through [facility] PAP and Humalog  through [employer] PAP; states she is running low on both insulins at this time  Assessment/Plan  Diabetes -Continue current regimen -I did discuss ability to use phone at [facility] Reader, so patient could switch to [place] 3+ for continuous readings to display on app versus having to scan sensor to get readings.  She did not express interest in this option.  Increased readings would likely provide more insight and awareness, hopefully leading to some improved control -Discussion around Rybelsus could also be beneficial since patient does not wish to restart Ozempic; she could also get this at not cost through [employer] P[employer] she is already enrolled in -Contacting medication assistance team to check on patient's next refills for Tresiba  and Humalog  -Patient sees PCP again in July   Follow-up:  8/25  Linn Rich, PharmD, DPLA

## 2024-04-06 ENCOUNTER — Telehealth: Payer: Self-pay

## 2024-04-06 NOTE — Progress Notes (Signed)
   04/06/2024  Patient ID: Kim Buchanan, female   DOB: 1950-12-23, 73 y.o.   MRN: [id number]  Lilly Cares informed medication assistance technician that the program should be contacting the patient this week regarding her refill for Humalog ; providing patient program phone number to call if she has not heard from them by EOW.  [employer] PAP states next refill of Tresiba  will go into processing 7/6, so this should arrive in approximately 1 month.    Contacted patient to make her aware.  Linn Rich, PharmD, DPLA

## 2024-04-10 ENCOUNTER — Telehealth: Payer: Self-pay

## 2024-04-10 NOTE — Telephone Encounter (Signed)
 Pt called stating that she has been having on and off shortness of breath for the last 2 months. Advised next available was end of July. Offered appointment with APP or another MD. Pt refused. States she'll be just fine until then. Advised pt to call back if she felt she needed an sooner appointment.

## 2024-04-15 ENCOUNTER — Other Ambulatory Visit: Payer: Self-pay | Admitting: Nurse Practitioner

## 2024-04-17 NOTE — Telephone Encounter (Signed)
 Requested Prescriptions  Pending Prescriptions Disp Refills   metFORMIN  (GLUCOPHAGE ) 1000 MG tablet [Pharmacy Med Name: metFORMIN  HCl 1000 MG Oral Tablet] 200 tablet 0    Sig: TAKE 1 TABLET BY MOUTH TWICE  DAILY WITH MEALS     Endocrinology:  Diabetes - Biguanides Passed - 04/17/2024 10:00 AM      Passed - Cr in normal range and within 360 days    Creat  Date Value Ref Range Status  01/01/2018 0.82 0.50 - 0.99 mg/dL Final    Comment:    For patients >63 years of age, the reference limit for Creatinine is approximately 13% higher for people identified as African-American. .    Creatinine, Ser  Date Value Ref Range Status  02/12/2024 0.63 0.57 - 1.00 mg/dL Final         Passed - HBA1C is between 0 and 7.9 and within 180 days    Hemoglobin A1C  Date Value Ref Range Status  07/11/2023 8.0  Final    Comment:    care everywhere   A1c  Date Value Ref Range Status  11/03/2023 7.1  Final    Comment:    ABSTRACTED BY HIM         Passed - eGFR in normal range and within 360 days    GFR, Est African American  Date Value Ref Range Status  01/01/2018 88 > OR = 60 mL/min/1.73m2 Final   GFR calc Af Amer  Date Value Ref Range Status  10/17/2020 79 >59 mL/min/1.73 Final    Comment:    **In accordance with recommendations from the NKF-ASN Task force,**   [facility] is in the process of updating its eGFR calculation to the   2021 CKD-EPI creatinine equation that estimates kidney function   without a race variable.    GFR, Est Non African American  Date Value Ref Range Status  01/01/2018 76 > OR = 60 mL/min/1.61m2 Final   GFR, Estimated  Date Value Ref Range Status  11/01/2023 >60 >60 mL/min Final    Comment:    (NOTE) Calculated using the CKD-EPI Creatinine Equation (2021)    eGFR  Date Value Ref Range Status  02/12/2024 95 >59 mL/min/1.73 Final         Passed - B12 Level in normal range and within 720 days    Vitamin B-12  Date Value Ref Range Status  11/16/2022 494  232 - 1,245 pg/mL Final         Passed - Valid encounter within last 6 months    Recent Outpatient Visits           2 months ago Annual physical exam   [place] [facility] Melvin Pao, NP       Future Appointments             In 2 months Gollan, Timothy J, MD [facility] - CBC within normal limits and completed in the last 12 months    WBC  Date Value Ref Range Status  02/12/2024 4.8 3.4 - 10.8 x10E3/uL Final  08/17/2023 3.9 (L) 4.0 - 10.5 K/uL Final   RBC  Date Value Ref Range Status  02/12/2024 4.41 3.77 - 5.28 x10E6/uL Final  08/17/2023 3.51 (L) 3.87 - 5.11 MIL/uL Final   Hemoglobin  Date Value Ref Range Status  02/12/2024 12.5 11.1 - 15.9 g/dL Final   Hematocrit  Date Value Ref Range Status  02/12/2024 39.2 34.0 - 46.6 % Final   MCHC  Date Value Ref Range Status  02/12/2024 31.9 31.5 - 35.7 g/dL Final  89/73/7975 69.0 30.0 - 36.0 g/dL Final   [facility]  Date Value Ref Range Status  02/12/2024 28.3 26.6 - 33.0 pg Final  08/17/2023 27.1 26.0 - 34.0 pg Final   MCV  Date Value Ref Range Status  02/12/2024 89 79 - 97 fL Final  02/24/2013 95 80 - 100 fL Final   No results found for: PLTCOUNTKUC, LABPLAT, POCPLA RDW  Date Value Ref Range Status  02/12/2024 15.0 11.7 - 15.4 % Final  02/24/2013 13.0 11.5 - 14.5 % Final

## 2024-04-21 ENCOUNTER — Other Ambulatory Visit: Payer: Self-pay | Admitting: Nurse Practitioner

## 2024-04-22 NOTE — Telephone Encounter (Signed)
 Requested Prescriptions  Pending Prescriptions Disp Refills   gabapentin  (NEURONTIN ) 400 MG capsule [Pharmacy Med Name: Gabapentin  400 MG Oral Capsule] 300 capsule 1    Sig: TAKE 1 CAPSULE BY MOUTH 3 TIMES  DAILY     Neurology: Anticonvulsants - gabapentin  Passed - 04/22/2024  3:10 PM      Passed - Cr in normal range and within 360 days    Creat  Date Value Ref Range Status  01/01/2018 0.82 0.50 - 0.99 mg/dL Final    Comment:    For patients >52 years of age, the reference limit for Creatinine is approximately 13% higher for people identified as African-American. .    Creatinine, Ser  Date Value Ref Range Status  02/12/2024 0.63 0.57 - 1.00 mg/dL Final         Passed - Completed PHQ-2 or PHQ-9 in the last 360 days      Passed - Valid encounter within last 12 months    Recent Outpatient Visits           2 months ago Annual physical exam   [place] [facility] Melvin Pao, NP       Future Appointments             In 2 months Gollan, Timothy J, MD [facility]

## 2024-04-28 ENCOUNTER — Other Ambulatory Visit (HOSPITAL_COMMUNITY): Payer: Self-pay | Admitting: Cardiology

## 2024-05-13 ENCOUNTER — Telehealth: Payer: Self-pay | Admitting: Cardiology

## 2024-05-13 NOTE — Telephone Encounter (Signed)
 Called to confirm/remind patient of their appointment at the [facility] on 05/14/24.   Appointment:   [x] Confirmed  [] Left mess   [] No answer/No voice mail  [] VM Full/unable to leave message  [] Phone not in service  Patient reminded to bring all medications and/or complete list.  Confirmed patient has transportation. Gave directions, instructed to utilize valet parking.

## 2024-05-14 ENCOUNTER — Other Ambulatory Visit
Admission: RE | Admit: 2024-05-14 | Discharge: 2024-05-14 | Disposition: A | Discharge status: Home / Self Care | Source: Ambulatory Visit | Attending: Cardiology | Admitting: Cardiology

## 2024-05-14 ENCOUNTER — Ambulatory Visit (HOSPITAL_COMMUNITY): Payer: Self-pay | Admitting: Cardiology

## 2024-05-14 ENCOUNTER — Ambulatory Visit (HOSPITAL_BASED_OUTPATIENT_CLINIC_OR_DEPARTMENT_OTHER): Admitting: Cardiology

## 2024-05-14 VITALS — BP 141/63 | HR 86 | Wt 224.0 lb

## 2024-05-14 DIAGNOSIS — K760 Fatty (change of) liver, not elsewhere classified: Secondary | ICD-10-CM | POA: Diagnosis not present

## 2024-05-14 DIAGNOSIS — I5032 Chronic diastolic (congestive) heart failure: Secondary | ICD-10-CM

## 2024-05-14 LAB — COMPREHENSIVE METABOLIC PANEL WITH GFR
ALT: 74 U/L — ABNORMAL HIGH (ref 0–44)
AST: 107 U/L — ABNORMAL HIGH (ref 15–41)
Albumin: 3.7 g/dL (ref 3.5–5.0)
Alkaline Phosphatase: 138 U/L — ABNORMAL HIGH (ref 38–126)
Anion gap: 13 (ref 5–15)
BUN: 14 mg/dL (ref 8–23)
CO2: 21 mmol/L — ABNORMAL LOW (ref 22–32)
Calcium: 9.1 mg/dL (ref 8.9–10.3)
Chloride: 103 mmol/L (ref 98–111)
Creatinine, Ser: 0.54 mg/dL (ref 0.44–1.00)
GFR, Estimated: >60 mL/min (ref >60)
Glucose, Bld: 245 mg/dL — ABNORMAL HIGH (ref 70–99)
Potassium: 4.1 mmol/L (ref 3.5–5.1)
Sodium: 137 mmol/L (ref 135–145)
Total Bilirubin: 1.1 mg/dL (ref 0.0–1.2)
Total Protein: 7.8 g/dL (ref 6.5–8.1)

## 2024-05-14 LAB — BRAIN NATRIURETIC PEPTIDE: B Natriuretic Peptide: 70.9 pg/mL (ref 0.0–100.0)

## 2024-05-14 MED ORDER — VALSARTAN 40 MG PO TABS: 20.0000 mg | ORAL_TABLET | Freq: Every day | ORAL | 1 refills | Status: DC

## 2024-05-14 MED ORDER — FUROSEMIDE 20 MG PO TABS: 20.0000 mg | ORAL_TABLET | Freq: Every day | ORAL | 3 refills | Status: DC

## 2024-05-14 NOTE — Patient Instructions (Addendum)
 Medication Changes:  START Valsartan  20mg  (1/2 tab) daily  START Furosemide  40mg  (2 tabs) daily FOR 3 DAYS ONLY THEN START 20mg  (1 tab) daily  Lab Work:  Go over to the [facility]. Go pass the gift shop and have your blood work completed.  We will only call you if the results are abnormal or if the provider would like to make medication changes.   If you are having labs drawn today, any labs that are abnormal the clinic will call you. No news is good news.   Testing/Procedures:  Your provider has ordered a right upper quadrant ultrasound of your abdomen. Someone will be in contact with you in order to schedule your appointment.   Referrals:  You have been referred to Dr. Jinny. His office will contact you in order to schedule your appointment.    Follow-Up in: Please follow up with the [facility] in 2 weeks with Ellouise Class, FNP.   Thank you for choosing [facility].    At the [facility], you and your health needs are our priority. We have a designated team specialized in the treatment of Heart Failure. This Care Team includes your primary Heart Failure Specialized Cardiologist (physician), Advanced Practice Providers (APPs- Physician Assistants and Nurse Practitioners), and Pharmacist who all work together to provide you with the care you need, when you need it.   You may see any of the following providers on your designated Care Team at your next follow up:  Dr. Toribio Fuel Dr. Ezra Shuck Dr. Ria Commander Dr. Morene Brownie Ellouise Class, FNP Jaun Bash, RPH-CPP  Please be sure to bring in all your medications bottles to every appointment.   Need to Contact Us :  If you have any questions or concerns before your next appointment please send us  a message through [place] or call our office at [phone].    TO LEAVE A MESSAGE FOR THE NURSE SELECT OPTION 2, PLEASE LEAVE A MESSAGE  INCLUDING: YOUR NAME DATE OF BIRTH CALL BACK NUMBER REASON FOR CALL**this is important as we prioritize the call backs  YOU WILL RECEIVE A CALL BACK THE SAME DAY AS LONG AS YOU CALL BEFORE 4:00 PM

## 2024-05-14 NOTE — Progress Notes (Signed)
 ReDS Vest / Clip - 05/14/24 1400       ReDS Vest / Clip   Station Marker B    Ruler Value 31    ReDS Value Range Moderate volume overload    ReDS Actual Value 36

## 2024-05-14 NOTE — Progress Notes (Signed)
 PCP: Melvin Pao, NP Cardiology: Dr. Perla HF Cardiology: Dr. Rolan  Chief complaint: CHF  73 y.o. with history of chronic systolic CHF/nonischemic cardiomyopathy, type 2 diabetes, and prior smoking was referred by Dr. Cindie for evaluation of CHF.  She has been noted to have a cardiomyopathy since 2020.  Echo in 11/20 showed EF 25-30%.  LHC in 11/20 and coronary CTA in 9/21 have shown nonobstructive CAD.  She has been evaluated by pulmonary, mild restrictive lung disease and mild COPD.  She quit smoking in 2020, she does not drink ETOH.   Echo in 3/22 showed EF 30-35%, with normal RV.   RHC in 5/22 was worrisome, showing right > left heart failure and low cardiac output with low PAPI.   Zio patch in 8/22 showed 21% PVCs.  Amiodarone  was started.  Cardiac MRI in 9/22 showed EF 40%, septal-lateral dyssynchrony, RV EF 45%, LGE in the mid-wall apical lateral and apical septal walls. Repeat Zio patch in 10/22 showed 5.8% PVCs, improved.    Patient stopped Farxiga  after 3 yeast infections.   Echo in 5/23 showed EF 35-40%, mildly decreased RV systolic function, trivial MR.  Echo in 6/24 showed EF up to 50-55%, normal RV, mild MR, IVC normal. PFTs showed mild obstruction from emphysema.   Patient was admitted in 10/24 with hypotension and AKI. Midodrine  was started and atorvastatin  (LFTs were elevated), Protonix , spironolactone , torsemide , and valsartan  were all stopped.  She was eventually titrated off midodrine .  Echo this admission showed EF 60-65% with normal RV size and systolic function.   She returns for followup of CHF.  For about a month, she has been feeling considerably worse.  She is short of breath with housework and walking up stairs.  Moderate activity now triggers dyspnea.  Generally does ok when walking on flat ground.  No orthopnea/PND.  No lightheadedness.  This is a clear change for her over the last month or two. Weight up 1 lb.   REDS clip 36%  Labs (6/22): K 4.4,  creatinine 0.79 Labs (2/23): K 4.8, creatinine 0.95, LDL 18, TSH 5.37, ALT 102, AST 95, hgb 15.2 Labs (8/23): LDL 74, K 4.3, creatinine 9.07, AST 33, ALT 46 Labs (2/24): LDL 19, TGs 173 Labs (3/24): K 4.9, creatinine 0.99, AST 127, ALT 132 Labs (5/24): K 4.7, creatinine 1.07, AST 147, ALT 73 Labs (6/24): TSH 9.58, LDL 26 Labs (11/24): K 4.4, creatinine 0.85, AST 83, ALT 99 Labs (12/24): TSH mildly elevated Labs (4/25): LDL 27, AST 84, ALT 82, alkaline phosphatase 212, TSH normal  ECG (personally reviewed): NSR, IVCD 130 msec  PMH: 1. Chronic systolic CHF: Nonischemic cardiomyopathy.   - Echo (6/20): Normal EF - Cardiolite (6/20): Anterior ischemia.  - Echo (11/20): EF 25-30% - LHC (11/20): 70% pRCA stenosis, FFR negative.  - Echo (8/21): EF 30-35% - Coronary CTA (9/21): Mild RCA disease, 40-59% mLAD stenosis but CT FFR negative.  - Echo (3/22): EF 30-35% with normal RV - RHC (5/22): mean RA 17, mean PA 28, mean PCWP 20, CI 1.3, PAPI < 1.  - Cardiac MRI (9/22): EF 40%, septal-lateral dyssynchrony, RV EF 45%, LGE in the mid-wall apical lateral and apical septal walls. - Echo (5/23): EF 35-40%, mildly decreased RV systolic function, trivial MR.   - Echo (6/24): EF up to 50-55%, normal RV, mild MR, IVC normal - Echo (10/24): EF 60-65% with normal RV size and systolic function.  2. COPD: Mild.  Quit smoking in 2020.  - PFTs with mild  obstruction consistent with emphysema.  3. Elevated LFTs: ?NAFLD - Abdominal US  (2/24): Fatty liver.  - HCV negative 4. Type 2 diabetes 5. Coronary artery disease: Nonobstructive.  6. Hyperlipidemia 7. OSA 8. GERD 9. Depression 10. PVCs: Zio patch in 8/22 showed 21% PVCs.  Amiodarone  was started. Repeat Zio patch in 10/22 showed 5.8% PVCs, improved.   11. Sleep study (8/22): No OSA.   FH: Brother with heart surgery, ?CHF.  SH: Married, no ETOH, quit smoking in 2020, retired, lives in [place]  ROS: All systems reviewed and negative except as per  HPI.   Current Outpatient Medications  Medication Sig Dispense Refill   amiodarone  (PACERONE ) 200 MG tablet Take 0.5 tablets (100 mg total) by mouth daily. 45 tablet 3   aspirin  EC 81 MG tablet Take 1 tablet (81 mg total) by mouth daily. Swallow whole. 30 tablet 11   Continuous Blood Gluc Receiver (FREESTYLE LIBRE 2 READER) DEVI 1 each by Does not apply route daily. 1 each 0   Continuous Glucose Sensor (FREESTYLE LIBRE 2 SENSOR) MISC 1 each by Does not apply route every 14 (fourteen) days. 12 each 1   cyclobenzaprine  (FLEXERIL ) 10 MG tablet Take 1 tablet (10 mg total) by mouth 3 (three) times daily as needed for muscle spasms. 90 tablet 1   furosemide  (LASIX ) 20 MG tablet Take 1 tablet (20 mg total) by mouth daily. 90 tablet 3   gabapentin  (NEURONTIN ) 400 MG capsule TAKE 1 CAPSULE BY MOUTH 3 TIMES  DAILY 300 capsule 1   insulin  degludec (TRESIBA  FLEXTOUCH) 200 UNIT/ML FlexTouch Pen Reduce from 100 units to 70 units. (Patient taking differently: Inject 80 Units into the skin daily. Reduce from 100 units to 70 units.)     insulin  lispro (HUMALOG  KWIKPEN) 100 UNIT/ML KwikPen Inject 7 Units into the skin 3 (three) times daily. With meals. (Patient taking differently: Inject 11 Units into the skin 3 (three) times daily. With meals.) 30 mL 4   Insulin  Pen Needle 30G X 6 MM MISC 1 Application by Does not apply route daily. 100 each 2   Lancets (ONETOUCH DELICA PLUS LANCET30G) MISC TEST ONCE DAILY 100 each 2   levothyroxine  (SYNTHROID ) 75 MCG tablet Take 1 tablet (75 mcg total) by mouth daily before breakfast. 90 tablet 3   metFORMIN  (GLUCOPHAGE ) 1000 MG tablet TAKE 1 TABLET BY MOUTH TWICE  DAILY WITH MEALS 200 tablet 0   metoprolol  succinate (TOPROL -XL) 25 MG 24 hr tablet TAKE 1 TABLET BY MOUTH DAILY 90 tablet 3   nitroGLYCERIN  (NITROSTAT ) 0.3 MG SL tablet Place 1 tablet (0.3 mg total) under the tongue every 5 (five) minutes as needed for chest pain. 100 tablet 3   ONETOUCH ULTRA test strip TEST ONCE  DAILY 100 strip 3   pantoprazole  (PROTONIX ) 40 MG tablet Take 1 tablet (40 mg total) by mouth daily. 100 tablet 3   polyethylene glycol powder (GLYCOLAX /MIRALAX ) 17 GM/SCOOP powder Take 17 g by mouth daily.     QUEtiapine  (SEROQUEL ) 200 MG tablet TAKE 1 TABLET BY MOUTH DAILY AT  BEDTIME 90 tablet 3   spironolactone  (ALDACTONE ) 25 MG tablet Take 0.5 tablets (12.5 mg total) by mouth daily. 50 tablet 3   valsartan  (DIOVAN ) 40 MG tablet Take 0.5 tablets (20 mg total) by mouth daily. 45 tablet 1   No current facility-administered medications for this visit.   BP (!) 141/63   Pulse 86   Wt 224 lb (101.6 kg)   SpO2 97%   BMI 39.68 kg/m  General: NAD Neck: JVP 8-9 cm, no thyromegaly or thyroid  nodule.  Lungs: Clear to auscultation bilaterally with normal respiratory effort. CV: Nondisplaced PMI.  Heart regular S1/S2, no S3/S4, no murmur.  1+ ankle edema.  No carotid bruit.  Normal pedal pulses.  Abdomen: Soft, nontender, no hepatosplenomegaly, no distention.  Skin: Intact without lesions or rashes.  Neurologic: Alert and oriented x 3.  Psych: Normal affect. Extremities: No clubbing or cyanosis.  HEENT: Normal.   Assessment/Plan: 1. Chronic systolic CHF: Nonischemic cardiomyopathy.  Echo in 3/22 with EF 30-35%, normal RV.  LHC in 11/20 and coronary CTA in 9/21 showed nonobstructive CAD.  RHC in 5/22 was worrisome with R>L heart failure and low cardiac output as well as low PAPI.  Cardiac MRI in 9/22 showed EF 40%, septal-lateral dyssynchrony, RV EF 45%, LGE in the mid-wall apical lateral and apical septal walls.  This was concerning for possible myocarditis (less likely amyloidosis or sarcoidosis).  Echo in 5/23 with EF 35-40%, mildly decreased RV systolic function, trivial MR.  Also concern that PVCs may play a role as Zio monitor in 8/22 showed 21% PVCs, improved to 5.8% with amiodarone .  Echo in 6/24 showed EF up to 50-55%, normal RV, mild MR, IVC normal.  Improvement in EF may be related to  suppression of PVCs. Echo in 10/24 showed normal EF 60-65% with normal RV.   She had been doing well, but now with increased dyspnea for several weeks.  She is volume overloaded by exam and REDS clip.  NYHA class III. - She needs a repeat echo.  - Lasix  40 mg daily x 3 days then 20 mg daily after that.  BMET/BNP today, BMET in 10 days.  - Restart valsartan  20 mg daily.  - Continue spironolactone  12.5 daily.   - Off dapagliflozin  with multiple yeast infections.  - Continue Toprol  XL to 25 mg daily.  2. CAD: nonobstructive by LHC in 11/20 and coronary CTA in 9/21.  - Off atorvastatin  with elevated LFTs.  Last LDL in 4/25 was 27.  - Continue ASA 81 daily.  3. PVCs: 21% PVCs on 8/22 Zio monitor.  ?Playing role in cardiomyopathy. PVCs down to 5.8% on Zio in 10/22 on amiodarone .  This may have led to improvement in EF by echo in 6/24 and in 10/24.  - Continue amiodarone  100 mg daily.  On Levoxyl  for hypothyroidism managed by PCP.  Also has chronic mild elevation in LFTs (see below), check today.  4. Elevated LFTs: This appears to be chronic and mild.  LFT elevation pre-dates amiodarone  use, and amiodarone  does not seem to have worsened LFTs.  Suspect NAFLD.  She had workup for viral hepatitis (negative) in the past.   - Statin was stopped. Amiodarone  now down to lowest dose (100 mg daily).  - Recheck LFTs today.   - As long as there is not a significant rise, continue amiodarone  at low dose. Could consider changing to mexiletine.  - RUQ US  to assess liver and gallbladder.  - I will refer her to GI for evaluation.  5. COPD: Prior smoker.  I suspect COPD plays a role in her dyspnea. PFTs with mild obstruction suggesting a degree of emphysema. Recent worsening, however, seems to be due to volume overload.  - Follow with pulmonary.   Followup with APP in 1 week.     I spent 32 minutes reviewing records, interviewing/examining patient, and managing orders.     Ezra Shuck 05/14/2024

## 2024-05-15 ENCOUNTER — Ambulatory Visit: Admitting: Nurse Practitioner

## 2024-05-19 ENCOUNTER — Ambulatory Visit (INDEPENDENT_AMBULATORY_CARE_PROVIDER_SITE_OTHER): Admitting: Nurse Practitioner

## 2024-05-19 ENCOUNTER — Telehealth: Payer: Self-pay | Admitting: Nurse Practitioner

## 2024-05-19 ENCOUNTER — Ambulatory Visit: Payer: Self-pay | Admitting: Pulmonary Disease

## 2024-05-19 ENCOUNTER — Encounter: Payer: Self-pay | Admitting: Nurse Practitioner

## 2024-05-19 ENCOUNTER — Telehealth: Payer: Self-pay

## 2024-05-19 VITALS — BP 111/67 | HR 83 | Temp 97.8°F | Ht 63.0 in | Wt 220.6 lb

## 2024-05-19 DIAGNOSIS — E039 Hypothyroidism, unspecified: Secondary | ICD-10-CM

## 2024-05-19 DIAGNOSIS — J410 Simple chronic bronchitis: Secondary | ICD-10-CM

## 2024-05-19 DIAGNOSIS — E1169 Type 2 diabetes mellitus with other specified complication: Secondary | ICD-10-CM

## 2024-05-19 DIAGNOSIS — I700 Atherosclerosis of aorta: Secondary | ICD-10-CM | POA: Diagnosis not present

## 2024-05-19 DIAGNOSIS — E1159 Type 2 diabetes mellitus with other circulatory complications: Secondary | ICD-10-CM | POA: Diagnosis not present

## 2024-05-19 DIAGNOSIS — I420 Dilated cardiomyopathy: Secondary | ICD-10-CM

## 2024-05-19 DIAGNOSIS — E785 Hyperlipidemia, unspecified: Secondary | ICD-10-CM | POA: Diagnosis not present

## 2024-05-19 DIAGNOSIS — I5020 Unspecified systolic (congestive) heart failure: Secondary | ICD-10-CM | POA: Diagnosis not present

## 2024-05-19 DIAGNOSIS — E1142 Type 2 diabetes mellitus with diabetic polyneuropathy: Secondary | ICD-10-CM

## 2024-05-19 DIAGNOSIS — I152 Hypertension secondary to endocrine disorders: Secondary | ICD-10-CM | POA: Diagnosis not present

## 2024-05-19 MED ORDER — TRAMADOL HCL 50 MG PO TABS: 50.0000 mg | ORAL_TABLET | Freq: Three times a day (TID) | ORAL | 0 refills | Status: DC | PRN

## 2024-05-19 MED ORDER — INSULIN LISPRO (1 UNIT DIAL) 100 UNIT/ML (KWIKPEN): 20.0000 [IU] | PEN_INJECTOR | Freq: Three times a day (TID) | SUBCUTANEOUS | 4 refills | Status: DC

## 2024-05-19 NOTE — Telephone Encounter (Signed)
 Please let patient know that Cardiology wanted her to follow up with Pulmonology.  I do not see that she has this scheduled.  Can we ask her to make an appointment or offer to make it for her? She was seen in by [facility] in 2024.

## 2024-05-19 NOTE — Assessment & Plan Note (Addendum)
 Chronic. Controlled.  Recently saw Cardiology who added back valsartan  and lasix .   Follow up in 3 months.  Labs ordered at visit today.

## 2024-05-19 NOTE — Assessment & Plan Note (Signed)
Chronic.  Controlled.  Continue with current medication regimen on Atorvastatin 10mg.  Labs ordered today.  Return to clinic in 3 months for reevaluation.  Call sooner if concerns arise.   

## 2024-05-19 NOTE — Assessment & Plan Note (Signed)
 Chronic.  Controlled.  Continue with current medication regimen.  Labs ordered today.  Return to clinic in 6 months for reevaluation.  Call sooner if concerns arise.  ? ?

## 2024-05-19 NOTE — Telephone Encounter (Signed)
 Called and spoke with patient, notified her of Karen's message.   Called over to [facility] to schedule patient's appointment. Representative states that a nurse will contact this patient to triage her due to her symptoms of increased SOB and assist her with scheduling an appointment.   Contacted patient back and advised her that pulmonary will be calling her shortly to speak with her about her symptoms and schedule a follow up appointment. Patient verbalized understanding.

## 2024-05-19 NOTE — Assessment & Plan Note (Signed)
 Chronic.  Controlled.  Continue with current medication regimen.  Needs an appt with Pulmonology.  Will call and see if patient has made one or if we can assist her in making one.  Labs ordered today.  Return to clinic in 3 months for reevaluation.  Call sooner if concerns arise.

## 2024-05-19 NOTE — Assessment & Plan Note (Signed)
 Observed on CT in 2021.  Continue with ASA and Atorvastatin .  Labs ordered.

## 2024-05-19 NOTE — Assessment & Plan Note (Signed)
 Chronic. Blood pressure well controlled at visit today.  Referral placed for VBCI.  Patient's LFTs are elevated but has known Hepatic steatosis.  Cardiology concerned with volume overload and started on Lasix .  Has follow up with HF clinic in 1 week.  Weight is down 4lbs since July 24.  Follow up in 3 months.  Will have labs done with Endocrinology.

## 2024-05-19 NOTE — Progress Notes (Signed)
   05/19/2024  Patient ID: [name], female   DOB: February 09, 1951, 73 y.o.   MRN: [id number]  PCP will be managing diabetes moving forward, and is increasing Humalog  dosing to 20 units TID with meals.  Order pending to go to [facility] (processing pharmacy for [facility]) for dose increase for Humalog .  Pharmacist there states medication will be automatically filled and shipped to patient.    Medication assistance team has faxed over provider portion of [employer] PAP application where patient receives Tresiba  from, so this medication will now ship to [facility] versus endocrinology office.  [name], PharmD, DPLA

## 2024-05-19 NOTE — Progress Notes (Signed)
 BP 111/67   Pulse 83   Temp 97.8 F (36.6 C) (Oral)   Ht 5' 3 (1.6 m)   Wt 220 lb 9.6 oz (100.1 kg)   SpO2 96%   BMI 39.08 kg/m    Subjective:    Patient ID: Kim Buchanan, female    DOB: [birth year], 73 y.o.   MRN: [id number]  HPI: Kim Buchanan is a 73 y.o. female  Chief Complaint  Patient presents with   Diabetes   Hyperlipidemia   Hypertension   Hypothyroidism   HYPERTENSION / HYPERLIPIDEMIA Continues to follow up with Cardiology.  Doing well with current medication regimen.  She is having SOB.  She will have an US  of the RUQ.  She was referred to GI.  Back on Lasix  20mg , Spirolactone and Valsartan . Satisfied with current treatment? yes Duration of hypertension: years BP monitoring frequency: a few times a month BP range:  BP medication side effects: no Past BP meds: metoprolol  and valsartan  Duration of hyperlipidemia: years Cholesterol medication side effects: no Cholesterol supplements: none Past cholesterol medications: atorvastain (lipitor) Medication compliance: excellent compliance Aspirin : yes Recent stressors: no Recurrent headaches: no Visual changes: no Palpitations: no Dyspnea: yes Chest pain: no Lower extremity edema: no Dizzy/lightheaded: no  DIABETES Patient no longer wants to see Endocrinology. Off Ozempic and Sulfonurea since hospitalization in October.  Last A1c in April was 9.8% .   Continued on Long and Short acting insulin .  Now using freestyle libre. Hypoglycemic episodes:no Polydipsia/polyuria: no Visual disturbance: no Chest pain: no Paresthesias: no Glucose Monitoring: yes  Accucheck frequency: in the morning  Fasting glucose: 200s  Post prandial:  Evening:  Before meals: Taking Insulin ?: yes  Long acting insulin : Tresiba  80u  Short acting insulin : Humalog  12 TID Blood Pressure Monitoring: a few times a month Retinal Examination: Up to Date Foot Exam: Up to Date Diabetic Education: Not Completed Pneumovax: Up to  Date Influenza: Up to Date Aspirin : yes  Continues to have back pain.  She is not using the gabapentin  because it was not helping.  She does use the Flexeril .    Relevant past medical, surgical, family and social history reviewed and updated as indicated. Interim medical history since our last visit reviewed. Allergies and medications reviewed and updated.  Review of Systems  Constitutional:  Positive for fatigue. Negative for fever and unexpected weight change.  Eyes:  Negative for visual disturbance.  Respiratory:  Positive for shortness of breath. Negative for cough and chest tightness.   Cardiovascular:  Negative for chest pain, palpitations and leg swelling.  Gastrointestinal:  Negative for constipation and diarrhea.  Endocrine: Negative for cold intolerance, heat intolerance, polydipsia and polyuria.  Neurological:  Negative for dizziness, numbness and headaches.  Psychiatric/Behavioral:  Negative for dysphoric mood. The patient is not nervous/anxious.     Per HPI unless specifically indicated above     Objective:    BP 111/67   Pulse 83   Temp 97.8 F (36.6 C) (Oral)   Ht 5' 3 (1.6 m)   Wt 220 lb 9.6 oz (100.1 kg)   SpO2 96%   BMI 39.08 kg/m   Wt Readings from Last 3 Encounters:  05/19/24 220 lb 9.6 oz (100.1 kg)  05/14/24 224 lb (101.6 kg)  02/12/24 218 lb 6.4 oz (99.1 kg)    Physical Exam Vitals and nursing note reviewed.  Constitutional:      General: She is not in acute distress.    Appearance: Normal appearance. She is normal  weight. She is not ill-appearing, toxic-appearing or diaphoretic.  HENT:     Head: Normocephalic.     Right Ear: External ear normal.     Left Ear: External ear normal.     Nose: Nose normal.     Mouth/Throat:     Mouth: Mucous membranes are moist.     Pharynx: Oropharynx is clear.  Eyes:     General:        Right eye: No discharge.        Left eye: No discharge.     Extraocular Movements: Extraocular movements intact.      Conjunctiva/sclera: Conjunctivae normal.     Pupils: Pupils are equal, round, and reactive to light.  Cardiovascular:     Rate and Rhythm: Normal rate and regular rhythm.     Heart sounds: No murmur heard. Pulmonary:     Effort: Pulmonary effort is normal. No respiratory distress.     Breath sounds: Normal breath sounds. No wheezing or rales.  Musculoskeletal:     Cervical back: Normal range of motion and neck supple.     Right lower leg: No edema.     Left lower leg: No edema.  Skin:    General: Skin is warm and dry.     Capillary Refill: Capillary refill takes less than 2 seconds.  Neurological:     General: No focal deficit present.     Mental Status: She is alert and oriented to person, place, and time. Mental status is at baseline.  Psychiatric:        Mood and Affect: Mood normal.        Behavior: Behavior normal.        Thought Content: Thought content normal.        Judgment: Judgment normal.     Results for orders placed or performed during the hospital encounter of 05/14/24  Brain natriuretic peptide   Collection Time: 05/14/24  3:01 PM  Result Value Ref Range   B Natriuretic Peptide 70.9 0.0 - 100.0 pg/mL  Comprehensive metabolic panel with GFR   Collection Time: 05/14/24  3:01 PM  Result Value Ref Range   Sodium 137 135 - 145 mmol/L   Potassium 4.1 3.5 - 5.1 mmol/L   Chloride 103 98 - 111 mmol/L   CO2 21 (L) 22 - 32 mmol/L   Glucose, Bld 245 (H) 70 - 99 mg/dL   BUN 14 8 - 23 mg/dL   Creatinine, Ser 9.45 0.44 - 1.00 mg/dL   Calcium  9.1 8.9 - 10.3 mg/dL   Total Protein 7.8 6.5 - 8.1 g/dL   Albumin 3.7 3.5 - 5.0 g/dL   AST 892 (H) 15 - 41 U/L   ALT 74 (H) 0 - 44 U/L   Alkaline Phosphatase 138 (H) 38 - 126 U/L   Total Bilirubin 1.1 0.0 - 1.2 mg/dL   GFR, Estimated >39 >39 mL/min   Anion gap 13 5 - 15      Assessment & Plan:   Problem List Items Addressed This Visit       Cardiovascular and Mediastinum   Hypertension associated with diabetes (HCC) -  Primary   Chronic. Controlled.  Recently saw Cardiology who added back valsartan  and lasix .   Follow up in 3 months.  Labs ordered at visit today.        Relevant Orders   Comprehensive metabolic panel with GFR   Dilated cardiomyopathy (HCC)   Chronic. Blood pressure well controlled at visit today.  Referral  placed for VBCI.  Patient's LFTs are elevated but has known Hepatic steatosis.  Cardiology concerned with volume overload and started on Lasix .  Has follow up with HF clinic in 1 week.  Weight is down 4lbs since July 24.  Follow up in 3 months.  Will have labs done with Endocrinology.      Relevant Orders   AMB Referral VBCI Care Management   Aortic atherosclerosis (HCC)   Observed on CT in 2021.  Continue with ASA and Atorvastatin .  Labs ordered.        HFrEF (heart failure with reduced ejection fraction) (HCC)   Chronic. Blood pressure well controlled at visit today.  Referral placed for VBCI.  Patient's LFTs are elevated but has known Hepatic steatosis.  Cardiology concerned with volume overload and started on Lasix .  Has follow up with HF clinic in 1 week.  Weight is down 4lbs since July 24.  Follow up in 3 months.  Will have labs done with Endocrinology.      Relevant Orders   AMB Referral VBCI Care Management     Respiratory   COPD (chronic obstructive pulmonary disease) (HCC)   Chronic.  Controlled.  Continue with current medication regimen.  Needs an appt with Pulmonology.  Will call and see if patient has made one or if we can assist her in making one.  Labs ordered today.  Return to clinic in 3 months for reevaluation.  Call sooner if concerns arise.         Endocrine   Hyperlipidemia associated with type 2 diabetes mellitus (HCC)   Chronic.  Controlled.  Continue with current medication regimen on Atorvastatin  10mg .  Labs ordered today.  Return to clinic in 3 months for reevaluation.  Call sooner if concerns arise.       Relevant Orders   Lipid panel   Type 2  diabetes mellitus with peripheral neuropathy (HCC)   Chronic.  Not as well controlled.  Last A1c was 9.8% in April.  No longer wants to see Endocrinology.   No longer on Ozempic due to hospitalization in October. Continue with Tresiba  80u and increase Humalog  to 20u. Follow up in 3 months.  Call sooner if concerns arise.       Relevant Orders   Hemoglobin A1c   AMB Referral VBCI Care Management   Hypothyroidism   Chronic.  Controlled.  Continue with current medication regimen.  Labs ordered today.  Return to clinic in 6 months for reevaluation.  Call sooner if concerns arise.        Relevant Orders   TSH   T4     Other   Morbid obesity (HCC)   Recommended eating smaller high protein, low fat meals more frequently and exercising 30 mins a day 5 times a week with a goal of 10-15lb weight loss in the next 3 months.         Follow up plan: Return in about 3 months (around 08/19/2024) for HTN, HLD, DM2 FU.

## 2024-05-19 NOTE — Telephone Encounter (Addendum)
 FYI Only or Action Required?: Action required by provider: request for appointment and update on patient condition.  Patient is followed in Pulmonology for SOB, last seen on 07/17/2023 by Malka Domino, MD.  Called Nurse Triage reporting Shortness of Breath.  Symptoms began several months ago.  Interventions attempted: Nothing.  Symptoms are: unchanged.  Triage Disposition: Call Specialist Today  Patient/caregiver understands and will follow disposition?: Unsure     Reason for Disposition  [1] MODERATE longstanding difficulty breathing (e.g., speaks in phrases, SOB even at rest, pulse 100-120) AND [2] SAME as normal  Answer Assessment - Initial Assessment Questions E2C2 Pulmonary Triage - Initial Assessment Questions Chief Complaint (e.g., cough, sob, wheezing, fever, chills, sweat or additional symptoms) *Go to specific symptom protocol after initial questions. SOB  How long have symptoms been present? Intermittent for a few months  Have you tested for COVID or Flu? Note: If not, ask patient if a home test can be taken. If so, instruct patient to call back for positive results. No  MEDICINES:   Have you used any OTC meds to help with symptoms? No If yes, ask What medications? N/a  Have you used your inhalers/maintenance medication? No If yes, What medications? N/a Pt endorses trying INH but did not provide relief - reports has tried a few  If inhaler, ask How many puffs and how often? Note: Review instructions on medication in the chart. N/a  OXYGEN : Do you wear supplemental oxygen ? No If yes, How many liters are you supposed to use? N/a Endorses been on O2 years ago-- endorses SOB may be related to heart issues  Do you monitor your oxygen  levels? No If yes, What is your reading (oxygen  level) today? 96% at PCP office  What is your usual oxygen  saturation reading?  (Note: Pulmonary O2 sats should be 90% or  greater) 90's    1. RESPIRATORY STATUS: Describe your breathing? (e.g., wheezing, shortness of breath, unable to speak, severe coughing)      See above 2. ONSET: When did this breathing problem begin?      See above 3. PATTERN Does the difficult breathing come and go, or has it been constant since it started?      Comes and goes 4. SEVERITY: How bad is your breathing? (e.g., mild, moderate, severe)      Mild - with exertion 5. RECURRENT SYMPTOM: Have you had difficulty breathing before? If Yes, ask: When was the last time? and What happened that time?      Yes, ongoing -- has concerns with cardiac etiology 6. CARDIAC HISTORY: Do you have any history of heart disease? (e.g., heart attack, angina, bypass surgery, angioplasty)      CHF - endorses being on water  pills and has gained some weight Pt endorses F/U with cardiologist ultrasound appt next Thursday  7. LUNG HISTORY: Do you have any history of lung disease?  (e.g., pulmonary embolus, asthma, emphysema)     COPD 8. CAUSE: What do you think is causing the breathing problem?      COPD vs CHF 9. OTHER SYMPTOMS: Do you have any other symptoms? (e.g., chest pain, cough, dizziness, fever, runny nose)     denies 10. O2 SATURATION MONITOR:  Do you use an oxygen  saturation monitor (pulse oximeter) at home? If Yes, ask: What is your reading (oxygen  level) today? What is your usual oxygen  saturation reading? (e.g., 95%)       N/a 11. PREGNANCY: Is there any chance you are pregnant? When was your  last menstrual period?       N/a 12. TRAVEL: Have you traveled out of the country in the last month? (e.g., travel history, exposures)       N/a    Pt PCP requested [facility] appt. [facility] called to triage pt and attempted to schedule with [facility], but no access. Triager offered to schedule at alternate [facility] location, but pt declines and does not want to drive to [place].  Triager will forward encounter for Dr Malka  's office to review and advise. Patient verbalized understanding and is expecting call back from office for appt accommodations.  Of note, pt does endorse cardiology appt for possible ultrasound next Thursday.  Protocols used: Breathing Difficulty-A-AH

## 2024-05-19 NOTE — Assessment & Plan Note (Signed)
 Recommended eating smaller high protein, low fat meals more frequently and exercising 30 mins a day 5 times a week with a goal of 10-15lb weight loss in the next 3 months.

## 2024-05-19 NOTE — Assessment & Plan Note (Signed)
 Chronic.  Not as well controlled.  Last A1c was 9.8% in April.  No longer wants to see Endocrinology.   No longer on Ozempic due to hospitalization in October. Continue with Tresiba  80u and increase Humalog  to 20u. Follow up in 3 months.  Call sooner if concerns arise.

## 2024-05-20 ENCOUNTER — Ambulatory Visit: Payer: Self-pay | Admitting: Nurse Practitioner

## 2024-05-20 LAB — COMPREHENSIVE METABOLIC PANEL WITH GFR
ALT: 77 IU/L — ABNORMAL HIGH (ref 0–32)
AST: 75 IU/L — ABNORMAL HIGH (ref 0–40)
Albumin: 4.3 g/dL (ref 3.8–4.8)
Alkaline Phosphatase: 167 IU/L — ABNORMAL HIGH (ref 44–121)
BUN/Creatinine Ratio: 28 (ref 12–28)
BUN: 19 mg/dL (ref 8–27)
Bilirubin Total: 0.6 mg/dL (ref 0.0–1.2)
CO2: 18 mmol/L — ABNORMAL LOW (ref 20–29)
Calcium: 8.8 mg/dL (ref 8.7–10.3)
Chloride: 98 mmol/L (ref 96–106)
Creatinine, Ser: 0.68 mg/dL (ref 0.57–1.00)
Globulin, Total: 3.1 g/dL (ref 1.5–4.5)
Glucose: 284 mg/dL — ABNORMAL HIGH (ref 70–99)
Potassium: 4.2 mmol/L (ref 3.5–5.2)
Sodium: 137 mmol/L (ref 134–144)
Total Protein: 7.4 g/dL (ref 6.0–8.5)
eGFR: 93 mL/min/1.73 (ref >59)

## 2024-05-20 LAB — HEMOGLOBIN A1C
Est. average glucose Bld gHb Est-mCnc: 278 mg/dL
Hgb A1c MFr Bld: 11.3 % — ABNORMAL HIGH (ref 4.8–5.6)

## 2024-05-20 LAB — LIPID PANEL
Chol/HDL Ratio: 2.2 ratio (ref 0.0–4.4)
Cholesterol, Total: 81 mg/dL — ABNORMAL LOW (ref 100–199)
HDL: 37 mg/dL — ABNORMAL LOW (ref >39)
LDL Chol Calc (NIH): 25 mg/dL (ref 0–99)
Triglycerides: 96 mg/dL (ref 0–149)
VLDL Cholesterol Cal: 19 mg/dL (ref 5–40)

## 2024-05-20 LAB — T4: T4, Total: 15.5 ug/dL — ABNORMAL HIGH (ref 4.5–12.0)

## 2024-05-20 LAB — TSH: TSH: 3.08 u[IU]/mL (ref 0.450–4.500)

## 2024-05-20 MED ORDER — LEVOTHYROXINE SODIUM 88 MCG PO TABS: 88.0000 ug | ORAL_TABLET | Freq: Every day | ORAL | 1 refills | Status: DC

## 2024-05-21 ENCOUNTER — Telehealth: Payer: Self-pay

## 2024-05-21 ENCOUNTER — Ambulatory Visit
Admission: RE | Admit: 2024-05-21 | Discharge: 2024-05-21 | Disposition: A | Discharge status: Home / Self Care | Source: Ambulatory Visit | Attending: Cardiology | Admitting: Cardiology

## 2024-05-21 DIAGNOSIS — K760 Fatty (change of) liver, not elsewhere classified: Secondary | ICD-10-CM | POA: Diagnosis not present

## 2024-05-21 NOTE — Progress Notes (Signed)
 Complex Care Management Note  Care Guide Note 05/21/2024 Name: Kim Buchanan MRN: [id number] DOB: 1951/08/06  Kim Buchanan is a 73 y.o. year old female who sees Melvin Pao, NP for primary care. I reached out to [name] by phone today to offer complex care management services.  [name] was given information about Complex Care Management services today including:   The Complex Care Management services include support from the care team which includes your Nurse Care Manager, Clinical Social Worker, or Pharmacist.  The Complex Care Management team is here to help remove barriers to the health concerns and goals most important to you. Complex Care Management services are voluntary, and the patient may decline or stop services at any time by request to their care team member.   Complex Care Management Consent Status: Patient agreed to services and verbal consent obtained.   Follow up plan:  Telephone appointment with complex care management team member scheduled for:  06/11/2024  Encounter Outcome:  Patient Scheduled  Jeoffrey Buffalo , RMA     [place]  [facility], [facility] Guide  Direct Dial : [phone]  Website: [email or web address]

## 2024-05-22 ENCOUNTER — Other Ambulatory Visit (HOSPITAL_COMMUNITY): Payer: Self-pay | Admitting: Cardiology

## 2024-05-22 ENCOUNTER — Other Ambulatory Visit: Payer: Self-pay | Admitting: Nurse Practitioner

## 2024-05-22 NOTE — Telephone Encounter (Signed)
 Patient is scheduled to see Dr. Isadora on 05/27/2024.  Nothing further needed.

## 2024-05-25 ENCOUNTER — Telehealth: Payer: Self-pay | Admitting: Family

## 2024-05-25 NOTE — Telephone Encounter (Signed)
 Called to confirm/remind patient of [facility] appointment [facility] on 05/26/[facility].   Appointment:   [x] Confirmed  [] Left mess   [] No answer/No voice mail  [] VM Full/unable to leave message  [] Phone not in service  Patient reminded to bring all medications and/or complete list.  Confirmed patient has transportation. Gave directions, instructed to utilize valet parking.

## 2024-05-25 NOTE — Telephone Encounter (Signed)
 Requested Prescriptions  Pending Prescriptions Disp Refills   Lancets (ONETOUCH DELICA PLUS LANCET30G) MISC [Pharmacy Med Name: OneTouch Delica Plus Lancet30G] 100 each 0    Sig: TEST ONCE DAILY     Endocrinology: Diabetes - Testing Supplies Passed - 05/25/2024  1:23 PM      Passed - Valid encounter within last 12 months    Recent Outpatient Visits           6 days ago Hypertension associated with diabetes [facility])   [place] [facility] Melvin Pao, NP   3 months ago Annual physical exam   [place] [facility] Melvin Pao, NP       Future Appointments             In 1 month Gollan, Timothy J, MD [facility]

## 2024-05-25 NOTE — Progress Notes (Unsigned)
 PCP: Melvin Pao, NP Cardiology: Dr. Perla HF Cardiology: Dr. Rolan  Chief complaint: shortness of breath   HPI:   [name] is a 73 y.o. female with a history of chronic systolic CHF/nonischemic cardiomyopathy, type 2 diabetes, and prior smoking was referred by Dr. Cindie for evaluation of CHF.  She has been noted to have a cardiomyopathy since 2020.  Echo in 11/20 showed EF 25-30%.  LHC in 11/20 and coronary CTA in 9/21 have shown nonobstructive CAD.  She has been evaluated by pulmonary, mild restrictive lung disease and mild COPD.  She quit smoking in 2020, she does not drink ETOH.  Echo in 3/22 showed EF 30-35%, with normal RV.  RHC in 5/22 was worrisome, showing right > left heart failure and low cardiac output with low PAPI.   Zio patch in 8/22 showed 21% PVCs.  Amiodarone  was started.  Cardiac MRI in 9/22 showed EF 40%, septal-lateral dyssynchrony, RV EF 45%, LGE in the mid-wall apical lateral and apical septal walls. Repeat Zio patch in 10/22 showed 5.8% PVCs, improved.    Patient stopped Farxiga  after 3 yeast infections.   Echo in 5/23 showed EF 35-40%, mildly decreased RV systolic function, trivial MR.  Echo in 6/24 showed EF up to 50-55%, normal RV, mild MR, IVC normal. PFTs showed mild obstruction from emphysema.   Patient was admitted in 10/24 with hypotension and AKI. Midodrine  was started and atorvastatin  (LFTs were elevated), Protonix , spironolactone , torsemide , and valsartan  were all stopped.  She was eventually titrated off midodrine .  Echo this admission showed EF 60-65% with normal RV size and systolic function.   Seen in [facility] 07/25 where ReDs reading was elevated. Lasix  40mg  was given for 3 days with reduction to 20mg . Valsartan  was started.   She presents today, with her husband, for a HF follow-up visit with a chief complaint of shortness of breath. Has associated occasional dizziness, palpitations. She is unclear if her symptoms are any better from last  visit. She is concerned about her liver tests being elevated. GI referral was placed at last visit but she hasn't heard anything from that office yet.   Labs (6/22): K 4.4, creatinine 0.79 Labs (2/23): K 4.8, creatinine 0.95, LDL 18, TSH 5.37, ALT 102, AST 95, hgb 15.2 Labs (8/23): LDL 74, K 4.3, creatinine 9.07, AST 33, ALT 46 Labs (2/24): LDL 19, TGs 173 Labs (3/24): K 4.9, creatinine 0.99, AST 127, ALT 132 Labs (5/24): K 4.7, creatinine 1.07, AST 147, ALT 73 Labs (6/24): TSH 9.58, LDL 26 Labs (11/24): K 4.4, creatinine 0.85, AST 83, ALT 99 Labs (12/24): TSH mildly elevated Labs (4/25): LDL 27, AST 84, ALT 82, alkaline phosphatase 212, TSH normal Labs (07/25): K 4.1/ 4.2, creatinine 0.54/ 0.68, LDL 25, A1c 11.3%, TSH normal  ECG not done  PMH: 1. Chronic systolic CHF: Nonischemic cardiomyopathy.   - Echo (6/20): Normal EF - Cardiolite (6/20): Anterior ischemia.  - Echo (11/20): EF 25-30% - LHC (11/20): 70% pRCA stenosis, FFR negative.  - Echo (8/21): EF 30-35% - Coronary CTA (9/21): Mild RCA disease, 40-59% mLAD stenosis but CT FFR negative.  - Echo (3/22): EF 30-35% with normal RV - RHC (5/22): mean RA 17, mean PA 28, mean PCWP 20, CI 1.3, PAPI < 1.  - Cardiac MRI (9/22): EF 40%, septal-lateral dyssynchrony, RV EF 45%, LGE in the mid-wall apical lateral and apical septal walls. - Echo (5/23): EF 35-40%, mildly decreased RV systolic function, trivial MR.   - Echo (6/24): EF  up to 50-55%, normal RV, mild MR, IVC normal - Echo (10/24): EF 60-65% with normal RV size and systolic function.  2. COPD: Mild.  Quit smoking in 2020.  - PFTs with mild obstruction consistent with emphysema.  3. Elevated LFTs: ?NAFLD - Abdominal US  (2/24): Fatty liver.  - HCV negative 4. Type 2 diabetes 5. Coronary artery disease: Nonobstructive.  6. Hyperlipidemia 7. OSA 8. GERD 9. Depression 10. PVCs: Zio patch in 8/22 showed 21% PVCs.  Amiodarone  was started. Repeat Zio patch in 10/22 showed 5.8%  PVCs, improved.   11. Sleep study (8/22): No OSA.   FH: Brother with heart surgery, ?CHF.  SH: Married, no ETOH, quit smoking in 2020, retired, lives in [place]  ROS: All systems reviewed and negative except as per HPI.   Current Outpatient Medications  Medication Sig Dispense Refill   amiodarone  (PACERONE ) 200 MG tablet Take 0.5 tablets (100 mg total) by mouth daily. 45 tablet 3   aspirin  EC 81 MG tablet Take 1 tablet (81 mg total) by mouth daily. Swallow whole. 30 tablet 11   Continuous Blood Gluc Receiver (FREESTYLE LIBRE 2 READER) DEVI 1 each by Does not apply route daily. 1 each 0   Continuous Glucose Sensor (FREESTYLE LIBRE 2 SENSOR) MISC 1 each by Does not apply route every 14 (fourteen) days. 12 each 1   cyclobenzaprine  (FLEXERIL ) 10 MG tablet Take 1 tablet (10 mg total) by mouth 3 (three) times daily as needed for muscle spasms. 90 tablet 1   furosemide  (LASIX ) 20 MG tablet Take 1 tablet (20 mg total) by mouth daily. 90 tablet 3   insulin  degludec (TRESIBA  FLEXTOUCH) 200 UNIT/ML FlexTouch Pen Reduce from 100 units to 70 units. (Patient taking differently: Inject 80 Units into the skin daily. Reduce from 100 units to 70 units.)     insulin  lispro (HUMALOG  KWIKPEN) 100 UNIT/ML KwikPen Inject 20 Units into the skin 3 (three) times daily. With meals. 60 mL 4   Insulin  Pen Needle 30G X 6 MM MISC 1 Application by Does not apply route daily. 100 each 2   Lancets (ONETOUCH DELICA PLUS LANCET30G) MISC TEST ONCE DAILY 100 each 2   levothyroxine  (SYNTHROID ) 88 MCG tablet Take 1 tablet (88 mcg total) by mouth daily. 90 tablet 1   metFORMIN  (GLUCOPHAGE ) 1000 MG tablet TAKE 1 TABLET BY MOUTH TWICE  DAILY WITH MEALS 200 tablet 0   metoprolol  succinate (TOPROL -XL) 25 MG 24 hr tablet TAKE 1 TABLET BY MOUTH DAILY 90 tablet 3   nitroGLYCERIN  (NITROSTAT ) 0.3 MG SL tablet Place 1 tablet (0.3 mg total) under the tongue every 5 (five) minutes as needed for chest pain. 100 tablet 3   ONETOUCH ULTRA test  strip TEST ONCE DAILY (Patient not taking: Reported on 05/19/2024) 100 strip 3   pantoprazole  (PROTONIX ) 40 MG tablet Take 1 tablet (40 mg total) by mouth daily. 100 tablet 3   polyethylene glycol powder (GLYCOLAX /MIRALAX ) 17 GM/SCOOP powder Take 17 g by mouth daily.     QUEtiapine  (SEROQUEL ) 200 MG tablet TAKE 1 TABLET BY MOUTH DAILY AT  BEDTIME 90 tablet 3   spironolactone  (ALDACTONE ) 25 MG tablet Take 0.5 tablets (12.5 mg total) by mouth daily. 50 tablet 3   traMADol  (ULTRAM ) 50 MG tablet Take 1 tablet (50 mg total) by mouth every 8 (eight) hours as needed for severe pain (pain score 7-10). 30 tablet 0   valsartan  (DIOVAN ) 40 MG tablet Take 0.5 tablets (20 mg total) by mouth daily. 45 tablet  1   No current facility-administered medications for this visit.   Vitals:   05/26/24 1458  BP: 128/61  Pulse: 93  SpO2: 97%  Weight: 218 lb 8 oz (99.1 kg)   Wt Readings from Last 3 Encounters:  05/26/24 218 lb 8 oz (99.1 kg)  05/19/24 220 lb 9.6 oz (100.1 kg)  05/14/24 224 lb (101.6 kg)   Lab Results  Component Value Date   CREATININE 0.68 05/26/2024   CREATININE 0.68 05/19/2024   CREATININE 0.54 05/14/2024   Physical Exam:   General: Well appearing.  Cor: No JVD. Regular rhythm, rate.  Lungs: clear Abdomen: soft, nontender, nondistended. Extremities: trace pitting edema around bilateral ankles Neuro:. Affect pleasant  ReDs reading: 34 %, normal   Assessment/Plan:  1. Chronic systolic CHF: Nonischemic cardiomyopathy.  Echo in 3/22 with EF 30-35%, normal RV.  LHC in 11/20 and coronary CTA in 9/21 showed nonobstructive CAD.  RHC in 5/22 was worrisome with R>L heart failure and low cardiac output as well as low PAPI.  Cardiac MRI in 9/22 showed EF 40%, septal-lateral dyssynchrony, RV EF 45%, LGE in the mid-wall apical lateral and apical septal walls.  This was concerning for possible myocarditis (less likely amyloidosis or sarcoidosis).  Echo in 5/23 with EF 35-40%, mildly decreased RV  systolic function, trivial MR.  Also concern that PVCs may play a role as Zio monitor in 8/22 showed 21% PVCs, improved to 5.8% with amiodarone .  Echo in 6/24 showed EF up to 50-55%, normal RV, mild MR, IVC normal.  Improvement in EF may be related to suppression of PVCs. Echo in 10/24 showed normal EF 60-65% with normal RV.   - NYHA class III - euvolemic  - ReDs today is 34% (previously 36%) - Updated echo scheduled for 10/25 - Continue Lasix  20mg  daily. BMET today - Continue valsartan  20 mg daily. Cough w/ entresto  - Continue spironolactone  12.5 daily.  Consider titration after getting lab results back today - Off dapagliflozin  with multiple yeast infections.  - Continue Toprol  XL to 25 mg daily.  - BNP 05/14/24 was 70.9. BNP today - BMET 05/19/24 reviewed: sodium 137, potassium 4.2, creatinine 0.68 and GFR 93  2. CAD: nonobstructive by LHC in 11/20 and coronary CTA in 9/21.  - Off atorvastatin  with elevated LFTs.  Last LDL in 07/25 was 25.  - Continue ASA 81 daily.   3. PVCs: 21% PVCs on 8/22 Zio monitor.  ?Playing role in cardiomyopathy. PVCs down to 5.8% on Zio in 10/22 on amiodarone .  This may have led to improvement in EF by echo in 6/24 and in 10/24.  - Continue amiodarone  100 mg daily.  On Levoxyl  for hypothyroidism managed by PCP.  Also has chronic mild elevation in LFTs.  4. Elevated LFTs: This appears to be chronic and mild.  LFT elevation pre-dates amiodarone  use, and amiodarone  does not seem to have worsened LFTs.  Suspect NAFLD.  She had workup for viral hepatitis (negative) in the past.   - Statin was stopped. Amiodarone  at lowest dose (100 mg daily).  - LFT's checked 07/25 and still elevated. GI referral placed @ last visit although that office has waiting list due to singe provider in practice. After discussion w/ patient, GI referral to [facility] placed today  - As long as there is not a significant rise, continue amiodarone  at low dose. Could consider changing to  mexiletine.  - RUQ US  05/21/24:  Diffusely echogenic hepatic parenchyma consistent with fatty liver infiltration  - drank  alcohol as teenager/ young adult but says that it wasn't excessive  5. COPD: Prior smoker.  I suspect COPD plays a role in her dyspnea. PFTs with mild obstruction suggesting a degree of emphysema.  - Follow with pulmonary.    Return in 2 months after echo, sooner if med changes after lab results.    Ellouise DELENA Class 05/25/2024

## 2024-05-26 ENCOUNTER — Ambulatory Visit: Admitting: Family

## 2024-05-26 ENCOUNTER — Ambulatory Visit: Payer: Self-pay | Admitting: Family

## 2024-05-26 ENCOUNTER — Other Ambulatory Visit
Admission: RE | Admit: 2024-05-26 | Discharge: 2024-05-26 | Disposition: A | Discharge status: Home / Self Care | Source: Ambulatory Visit | Attending: Family | Admitting: Family

## 2024-05-26 ENCOUNTER — Encounter: Payer: Self-pay | Admitting: Family

## 2024-05-26 VITALS — BP 128/61 | HR 93 | Wt 218.5 lb

## 2024-05-26 DIAGNOSIS — Z7982 Long term (current) use of aspirin: Secondary | ICD-10-CM | POA: Diagnosis not present

## 2024-05-26 DIAGNOSIS — Z79899 Other long term (current) drug therapy: Secondary | ICD-10-CM | POA: Diagnosis not present

## 2024-05-26 DIAGNOSIS — I2510 Atherosclerotic heart disease of native coronary artery without angina pectoris: Secondary | ICD-10-CM | POA: Insufficient documentation

## 2024-05-26 DIAGNOSIS — J410 Simple chronic bronchitis: Secondary | ICD-10-CM | POA: Diagnosis not present

## 2024-05-26 DIAGNOSIS — R7989 Other specified abnormal findings of blood chemistry: Secondary | ICD-10-CM

## 2024-05-26 DIAGNOSIS — K760 Fatty (change of) liver, not elsewhere classified: Secondary | ICD-10-CM | POA: Diagnosis not present

## 2024-05-26 DIAGNOSIS — E119 Type 2 diabetes mellitus without complications: Secondary | ICD-10-CM | POA: Diagnosis not present

## 2024-05-26 DIAGNOSIS — I493 Ventricular premature depolarization: Secondary | ICD-10-CM

## 2024-05-26 DIAGNOSIS — I428 Other cardiomyopathies: Secondary | ICD-10-CM | POA: Insufficient documentation

## 2024-05-26 DIAGNOSIS — Z79890 Hormone replacement therapy (postmenopausal): Secondary | ICD-10-CM | POA: Diagnosis not present

## 2024-05-26 DIAGNOSIS — Z87891 Personal history of nicotine dependence: Secondary | ICD-10-CM | POA: Insufficient documentation

## 2024-05-26 DIAGNOSIS — R0602 Shortness of breath: Secondary | ICD-10-CM | POA: Diagnosis not present

## 2024-05-26 DIAGNOSIS — E039 Hypothyroidism, unspecified: Secondary | ICD-10-CM | POA: Diagnosis not present

## 2024-05-26 DIAGNOSIS — I5022 Chronic systolic (congestive) heart failure: Secondary | ICD-10-CM | POA: Diagnosis not present

## 2024-05-26 DIAGNOSIS — Z794 Long term (current) use of insulin: Secondary | ICD-10-CM | POA: Diagnosis not present

## 2024-05-26 DIAGNOSIS — I25118 Atherosclerotic heart disease of native coronary artery with other forms of angina pectoris: Secondary | ICD-10-CM

## 2024-05-26 DIAGNOSIS — J439 Emphysema, unspecified: Secondary | ICD-10-CM | POA: Diagnosis not present

## 2024-05-26 DIAGNOSIS — I5032 Chronic diastolic (congestive) heart failure: Secondary | ICD-10-CM

## 2024-05-26 DIAGNOSIS — G4733 Obstructive sleep apnea (adult) (pediatric): Secondary | ICD-10-CM | POA: Diagnosis not present

## 2024-05-26 DIAGNOSIS — Z7984 Long term (current) use of oral hypoglycemic drugs: Secondary | ICD-10-CM | POA: Diagnosis not present

## 2024-05-26 LAB — BASIC METABOLIC PANEL WITH GFR
Anion gap: 16 — ABNORMAL HIGH (ref 5–15)
BUN: 10 mg/dL (ref 8–23)
CO2: 22 mmol/L (ref 22–32)
Calcium: 8.9 mg/dL (ref 8.9–10.3)
Chloride: 98 mmol/L (ref 98–111)
Creatinine, Ser: 0.68 mg/dL (ref 0.44–1.00)
GFR, Estimated: >60 mL/min (ref >60)
Glucose, Bld: 280 mg/dL — ABNORMAL HIGH (ref 70–99)
Potassium: 4.1 mmol/L (ref 3.5–5.1)
Sodium: 136 mmol/L (ref 135–145)

## 2024-05-26 LAB — BRAIN NATRIURETIC PEPTIDE: B Natriuretic Peptide: 15.4 pg/mL (ref 0.0–100.0)

## 2024-05-26 NOTE — Progress Notes (Signed)
 ReDS Vest / Clip - 05/26/24 1500       ReDS Vest / Clip   Station Marker A    Ruler Value 27    ReDS Value Range Low volume    ReDS Actual Value 34

## 2024-05-26 NOTE — Patient Instructions (Addendum)
 Medication Changes:  No medication changes.   Lab Work:  Go over to the [facility]ICAL MALL. Go pass the gift shop and have your blood work completed.  We will only call you if the results are abnormal or if the provider would like to make medication changes.   No news is good news.   Procedures/Tests:  Please have your echo completed. You will check in for this at the [facility]. You have to arrive 15 MINS EARLY for preparation, otherwise you will have to reschedule.   Referrals:  We have placed a referral today to Gastroenterology for your elevated LFTs. They should reach out to you within 1-2 weeks. If they do not, please reach out to them. Their information is below on this AVS.   Follow-Up: with Dr. Rolan after completing Echo.  Our Doctors' schedules are NOT open yet for after September. We will place you on our recall list. Once they are available, we will call you to schedule your follow up appointment.    Thank you for choosing [facility].    At the [facility], you and your health needs are our priority. We have a designated team specialized in the treatment of Heart Failure. This Care Team includes your primary Heart Failure Specialized Cardiologist (physician), Advanced Practice Providers (APPs- Physician Assistants and Nurse Practitioners), and Pharmacist who all work together to provide you with the care you need, when you need it.   You may see any of the following providers on your designated Care Team at your next follow up:  Dr. Toribio Fuel Dr. Ezra Rolan Dr. Ria Commander Dr. Morene Brownie Ellouise Class, FNP Jaun Bash, RPH-CPP  Please be sure to bring in all your medications bottles to every appointment.   Need to Contact Us :  If you have any questions or concerns before your next appointment please send us  a message through Caroline or call our office at [phone].    TO LEAVE A  MESSAGE FOR THE NURSE SELECT OPTION 2, PLEASE LEAVE A MESSAGE INCLUDING: YOUR NAME DATE OF BIRTH CALL BACK NUMBER REASON FOR CALL**this is important as we prioritize the call backs  YOU WILL RECEIVE A CALL BACK THE SAME DAY AS LONG AS YOU CALL BEFORE 4:00 PM

## 2024-05-27 ENCOUNTER — Ambulatory Visit (INDEPENDENT_AMBULATORY_CARE_PROVIDER_SITE_OTHER): Admitting: Student in an Organized Health Care Education/Training Program

## 2024-05-27 ENCOUNTER — Ambulatory Visit
Admission: RE | Admit: 2024-05-27 | Discharge: 2024-05-27 | Disposition: A | Discharge status: Home / Self Care | Source: Ambulatory Visit | Attending: Student in an Organized Health Care Education/Training Program | Admitting: Student in an Organized Health Care Education/Training Program

## 2024-05-27 ENCOUNTER — Ambulatory Visit: Admitting: Student in an Organized Health Care Education/Training Program

## 2024-05-27 ENCOUNTER — Encounter: Payer: Self-pay | Admitting: Student in an Organized Health Care Education/Training Program

## 2024-05-27 VITALS — BP 104/54 | HR 88 | Temp 97.5°F | Ht 63.0 in | Wt 220.2 lb

## 2024-05-27 DIAGNOSIS — J449 Chronic obstructive pulmonary disease, unspecified: Secondary | ICD-10-CM | POA: Diagnosis not present

## 2024-05-27 DIAGNOSIS — R0602 Shortness of breath: Secondary | ICD-10-CM | POA: Diagnosis not present

## 2024-05-27 DIAGNOSIS — J410 Simple chronic bronchitis: Secondary | ICD-10-CM

## 2024-05-27 MED ORDER — UMECLIDINIUM-VILANTEROL 62.5-25 MCG/ACT IN AEPB: 1.0000 | INHALATION_SPRAY | Freq: Every day | RESPIRATORY_TRACT | 12 refills | Status: DC

## 2024-05-27 NOTE — Progress Notes (Signed)
 Assessment & Plan:   #Shortness of breath (Primary) #COPD/PRISM   Presents for the evaluation of shortness of breath which appears to be back at the baseline.  She has no worsening shortness of breath at this point, no increasing cough, and no signs of an obvious exacerbation.  Her physical exam is unremarkable with clear lungs.  I have reviewed the patient's chart and note her visits with cardiology where she is being managed for nonischemic cardiomyopathy and decompensated heart failure.  She is currently on GDMT as well as diuretics.  I have reviewed her PFTs which show an obstructive defect with preserved ratio suggestive of prism .  Today, I discussed the reinitiation of inhaler therapy with her.  I will send a prescription for LAMA LABA to her pharmacy and obtain a chest x-ray to rule out any obvious infiltrates.  I have also discussed initiation of pulmonary rehabilitation with the patient and she agrees to participate.  - umeclidinium-vilanterol (ANORO ELLIPTA ) 62.5-25 MCG/ACT AEPB; Inhale 1 puff into the lungs daily.  Dispense: 30 each; Refill: 12 - AMB referral to pulmonary rehabilitation - DG Chest 2 View; Future   Return in about 3 months (around 08/27/2024) with Dr. Malka.  I spent 42 minutes caring for this patient today, including preparing to see the patient, obtaining a medical history , reviewing a separately obtained history, performing a medically appropriate examination and/or evaluation, counseling and educating the patient/family/caregiver, ordering medications, tests, or procedures, documenting clinical information in the electronic health record, and independently interpreting results (not separately reported/billed) and communicating results to the patient/family/caregiver  Belva November, MD [facility] 05/27/2024 10:09 AM    End of visit medications:  Meds ordered this encounter  Medications   umeclidinium-vilanterol (ANORO ELLIPTA ) 62.5-25  MCG/ACT AEPB    Sig: Inhale 1 puff into the lungs daily.    Dispense:  30 each    Refill:  12     Current Outpatient Medications:    amiodarone  (PACERONE ) 200 MG tablet, Take 0.5 tablets (100 mg total) by mouth daily., Disp: 45 tablet, Rfl: 3   aspirin  EC 81 MG tablet, Take 1 tablet (81 mg total) by mouth daily. Swallow whole., Disp: 30 tablet, Rfl: 11   Continuous Blood Gluc Receiver (FREESTYLE LIBRE 2 READER) DEVI, 1 each by Does not apply route daily., Disp: 1 each, Rfl: 0   Continuous Glucose Sensor (FREESTYLE LIBRE 2 SENSOR) MISC, 1 each by Does not apply route every 14 (fourteen) days., Disp: 12 each, Rfl: 1   cyclobenzaprine  (FLEXERIL ) 10 MG tablet, Take 1 tablet (10 mg total) by mouth 3 (three) times daily as needed for muscle spasms., Disp: 90 tablet, Rfl: 1   furosemide  (LASIX ) 20 MG tablet, Take 1 tablet (20 mg total) by mouth daily., Disp: 90 tablet, Rfl: 3   insulin  degludec (TRESIBA  FLEXTOUCH) 200 UNIT/ML FlexTouch Pen, Reduce from 100 units to 70 units., Disp: , Rfl:    insulin  lispro (HUMALOG  KWIKPEN) 100 UNIT/ML KwikPen, Inject 20 Units into the skin 3 (three) times daily. With meals., Disp: 60 mL, Rfl: 4   Insulin  Pen Needle 30G X 6 MM MISC, 1 Application by Does not apply route daily., Disp: 100 each, Rfl: 2   Lancets (ONETOUCH DELICA PLUS LANCET30G) MISC, TEST ONCE DAILY, Disp: 100 each, Rfl: 0   levothyroxine  (SYNTHROID ) 88 MCG tablet, Take 1 tablet (88 mcg total) by mouth daily., Disp: 90 tablet, Rfl: 1   metFORMIN  (GLUCOPHAGE ) 1000 MG tablet, TAKE 1 TABLET BY MOUTH TWICE  DAILY WITH MEALS, Disp: 200 tablet, Rfl: 0   metoprolol  succinate (TOPROL -XL) 25 MG 24 hr tablet, TAKE 1 TABLET BY MOUTH DAILY (Patient taking differently: Take 12.5 mg by mouth daily. TAKE 1 TABLET BY MOUTH DAILY), Disp: 90 tablet, Rfl: 3   nitroGLYCERIN  (NITROSTAT ) 0.3 MG SL tablet, Place 1 tablet (0.3 mg total) under the tongue every 5 (five) minutes as needed for chest pain., Disp: 100 tablet, Rfl:  3   ONETOUCH ULTRA test strip, TEST ONCE DAILY, Disp: 100 strip, Rfl: 3   pantoprazole  (PROTONIX ) 40 MG tablet, Take 1 tablet (40 mg total) by mouth daily., Disp: 100 tablet, Rfl: 3   polyethylene glycol powder (GLYCOLAX /MIRALAX ) 17 GM/SCOOP powder, Take 17 g by mouth daily., Disp: , Rfl:    QUEtiapine  (SEROQUEL ) 200 MG tablet, TAKE 1 TABLET BY MOUTH DAILY AT  BEDTIME, Disp: 90 tablet, Rfl: 3   spironolactone  (ALDACTONE ) 25 MG tablet, Take 0.5 tablets (12.5 mg total) by mouth daily., Disp: 50 tablet, Rfl: 3   traMADol  (ULTRAM ) 50 MG tablet, Take 1 tablet (50 mg total) by mouth every 8 (eight) hours as needed for severe pain (pain score 7-10)., Disp: 30 tablet, Rfl: 0   umeclidinium-vilanterol (ANORO ELLIPTA ) 62.5-25 MCG/ACT AEPB, Inhale 1 puff into the lungs daily., Disp: 30 each, Rfl: 12   valsartan  (DIOVAN ) 40 MG tablet, Take 0.5 tablets (20 mg total) by mouth daily., Disp: 45 tablet, Rfl: 1   Subjective:   PATIENT ID: Kim Buchanan GENDER: female DOB: [birth year], MRN: [id number]  Chief Complaint  Patient presents with   Follow-up    DOE. No wheezing or cough.    HPI  Patient is a pleasant 73 year old female with a past medical history of nonischemic cardiomyopathy, morbid obesity, and present who presents to clinic for an acute visit.  She is followed by my colleague Dr. Malka and was last seen by him in September 2024.  Patient reports chronic symptoms of shortness of breath with exertion over the past few years.  She denies any cough, wheeze, or sputum production.  She has never been hospitalized for a COPD exacerbation.  She was recently seen by cardiology with symptoms of increased shortness of breath as well as worsening lower extremity edema.  She was reinitiated on her diuretics with improvement in her symptoms.  An urgent appointment was scheduled in our clinic for further workup of this shortness of breath.  Today, she feels improved with return of symptoms to baseline.  She  continues to be short of breath with exertion but otherwise denies any cough, sputum production, wheeze, or chest tightness.  She was prescribed inhalers by Dr. Malka but has not felt any improvement with them and subsequently discontinued them.  She is unable to afford the Trelegy.  She did not use the inhalers regularly.  She does not have any fevers, chills, or night sweats.  No signs or symptoms of an upper respite tract infection.  She has a history of smoking, quit around 5 years ago.  She smoked half a pack a day since age 19, with around 40 pack years of smoking history.  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  Ancillary information including prior medications, full medical/surgical/family/social histories, and PFTs (when available) are listed below and have been reviewed.   Review of Systems  Constitutional:  Negative for chills, fever, malaise/fatigue and weight loss.  Respiratory:  Positive for shortness of breath (baseline). Negative for cough, hemoptysis, sputum production and wheezing.   Cardiovascular:  Negative for chest pain.     Objective:   Vitals:   05/27/24 0954  BP: (!) 104/54  Pulse: 88  Temp: (!) 97.5 F (36.4 C)  SpO2: 96%  Weight: 220 lb 3.2 oz (99.9 kg)  Height: 5' 3 (1.6 m)   96% on RA BMI Readings from Last 3 Encounters:  05/27/24 39.01 kg/m  05/26/24 38.71 kg/m  05/19/24 39.08 kg/m   Wt Readings from Last 3 Encounters:  05/27/24 220 lb 3.2 oz (99.9 kg)  05/26/24 218 lb 8 oz (99.1 kg)  05/19/24 220 lb 9.6 oz (100.1 kg)    Physical Exam Constitutional:      Appearance: She is obese.  Cardiovascular:     Rate and Rhythm: Normal rate and regular rhythm.     Pulses: Normal pulses.     Heart sounds: Normal heart sounds.  Pulmonary:     Effort: Pulmonary effort is normal.     Breath sounds: Normal breath sounds. No wheezing, rhonchi or rales.  Abdominal:     General: There is distension.     Palpations: Abdomen is soft.  Neurological:      General: No focal deficit present.     Mental Status: She is alert and oriented to person, place, and time. Mental status is at baseline.       Ancillary Information    Past Medical History:  Diagnosis Date   Anxiety    Arthritis    lower back   COPD (chronic obstructive pulmonary disease) (HCC)    Depression    Diabetes mellitus without complication (HCC)    GERD (gastroesophageal reflux disease)    Hyperlipidemia    Morbid obesity (HCC)    NICM (nonischemic cardiomyopathy) (HCC)    a. 03/2019 Echo: Nl LVEF; b. 09/04/2019 Echo: EF 25-30%; c. 09/11/2019 Cath: nonobs LAD/RCA dzs. EF 50-55%; c. 12/2020 Echo: EF 30-35%, glob HK. Mild LVH. Nl RVSP. Mild MR; d. 02/2021 RHC: Elev RH pressures. CO/CI 2.8/1.3; e. 06/2021 cMRI: EF 40%, marked sept-lat dyssynch & septal HK. Small, discrete areas of mid-wall LGE in apical lateral and apical septal walls - ? h/o myocarditis.   Nonobstructive CAD (coronary artery disease)    a. 03/2019 MV [facility]): EF 63%, ant ischemia; b. 08/2019 Cath: LM nl, LAD 50m, RCA 70p (iFR nl @ 0.97), 50p. EF 50-55%-->Med Rx; c. 06/2020 Cor CTA: Nl FFRs throughout cor tree.   Osteoporosis    Panic attack    PTSD (post-traumatic stress disorder)    PVC's (premature ventricular contractions)    a. 05/2021 Zio: 20.6% PVC burden. 13.4 secs of NSVT-->amio added; b. 06/2021 Zio: 5.8% PVC burden. Rare supraventrciular ectopy.   Rectal fistula    Sleep apnea    a. Prev wore CPAP but lost insurance and hasn't been using since (now has insurance).   Wears dentures    full upper     Family History  Problem Relation Age of Onset   Diabetes Mother    Diabetes Sister    Cancer Sister    Diabetes Brother    Lymphoma Brother    Heart disease Father    Hypertension Father    Diabetes Sister    Diabetes Brother    Lymphoma Brother    Heart disease Brother    Healthy Sister    Breast cancer Neg Hx    Ovarian cancer Neg Hx    Colon cancer Neg Hx      Past Surgical History:   Procedure Laterality Date  CARDIAC CATHETERIZATION     COLONOSCOPY  2014   COLONOSCOPY WITH PROPOFOL  N/A 10/01/2016   Procedure: COLONOSCOPY WITH PROPOFOL ;  Surgeon: Rogelia Copping, MD;  Location: [facility];  Service: Endoscopy;  Laterality: N/A;   COLONOSCOPY WITH PROPOFOL  N/A 01/04/2022   Procedure: COLONOSCOPY WITH PROPOFOL ;  Surgeon: Copping Rogelia, MD;  Location: [facility] END[facility];  Service: Endoscopy;  Laterality: N/A;   CORONARY PRESSURE/FFR WITH 3D MAPPING N/A 09/11/2019   Procedure: Coronary Pressure Wire/FFR w/3D Mapping;  Surgeon: Mady Bruckner, MD;  Location: [facility];  Service: Cardiovascular;  Laterality: N/A;   CYSTOCELE REPAIR N/A 12/17/2016   Procedure: ANTERIOR REPAIR (CYSTOCELE);  Surgeon: Gladis DELENA Dollar, MD;  Location: [facility] ORS;  Service: Gynecology;  Laterality: N/A;   ESOPHAGOGASTRODUODENOSCOPY (EGD) WITH PROPOFOL  N/A 08/11/2019   Procedure: ESOPHAGOGASTRODUODENOSCOPY (EGD) WITH PROPOFOL ;  Surgeon: Copping Rogelia, MD;  Location: [facility] ENDOSCOPY;  Service: Endoscopy;  Laterality: N/A;   POLYPECTOMY  10/01/2016   Procedure: POLYPECTOMY;  Surgeon: Rogelia Copping, MD;  Location: [facility];  Service: Endoscopy;;   RECTAL SURGERY  06/28/2015   Rectal prolapse, laparoscopic rectopexy the coldCharmont, MD; [facility]   RIGHT HEART CATH N/A 03/21/2021   Procedure: RIGHT HEART CATH;  Surgeon: Mady Bruckner, MD;  Location: [facility];  Service: Cardiovascular;  Laterality: N/A;   RIGHT/LEFT HEART CATH AND CORONARY ANGIOGRAPHY Bilateral 09/11/2019   Procedure: RIGHT/LEFT HEART CATH AND CORONARY ANGIOGRAPHY;  Surgeon: Perla Evalene PARAS, MD;  Location: [facility];  Service: Cardiovascular;  Laterality: Bilateral;   TOTAL ABDOMINAL HYSTERECTOMY W/ BILATERAL SALPINGOOPHORECTOMY     TUBAL LIGATION     VAGINAL HYSTERECTOMY Bilateral 12/17/2016   Procedure: HYSTERECTOMY VAGINAL WITH BILATERAL SALPINGO OOPHERECTOMY;  Surgeon: Gladis DELENA Dollar, MD;  Location: [facility] OR[facility];  Service: Gynecology;  Laterality: Bilateral;    Social History   Socioeconomic History   Marital status: Married    Spouse name: Solicitor   Number of children: 1   Years of education: Not on file   Highest education level: 9th grade  Occupational History    Employer: DISABLED    Comment: for panic attacks  Tobacco Use   Smoking status: Former    Current packs/day: 0.00    Average packs/day: 1.5 packs/day for 50.0 years (75.0 ttl pk-yrs)    Types: Cigarettes    Start date: 11/29/1968    Quit date: 11/29/2018    Years since quitting: 5.4   Smokeless tobacco: Never   Tobacco comments:    started at age 35  Vaping Use   Vaping status: Never Used  Substance and Sexual Activity   Alcohol use: No    Alcohol/week: 0.0 standard drinks of alcohol   Drug use: No   Sexual activity: Not Currently  Other Topics Concern   Not on file  Social History Narrative   Pt had one son, who died when was 55 YO in a MVA.   Social Drivers of Corporate investment banker Strain: Low Risk  (01/02/2024)   Overall Financial Resource Strain (CARDIA)    Difficulty of Paying Living Expenses: Not hard at all  Food Insecurity: No Food Insecurity (01/02/2024)   Hunger Vital Sign    Worried About Running Out of Food in the Last Year: Never true    Ran Out of Food in the Last Year: Never true  Transportation Needs: No Transportation Needs (01/02/2024)   PRAPARE - Transportation    Lack of Transportation (Medical): No    Lack of  Transportation (Non-Medical): No  Physical Activity: Inactive (01/02/2024)   Exercise Vital Sign    Days of Exercise per Week: 0 days    Minutes of Exercise per Session: 0 min  Stress: No Stress Concern Present (01/02/2024)   [employer] of Occupational Health - Occupational Stress Questionnaire    Feeling of Stress : Not at all  Social Connections: Socially Isolated (01/02/2024)   Social Connection and Isolation Panel    Frequency of  Communication with Friends and Family: Once a week    Frequency of Social Gatherings with Friends and Family: Once a week    Attends Religious Services: Never    Database administrator or Organizations: No    Attends Banker Meetings: Never    Marital Status: Married  Catering manager Violence: Not At Risk (01/02/2024)   Humiliation, Afraid, Rape, and Kick questionnaire    Fear of Current or Ex-Partner: No    Emotionally Abused: No    Physically Abused: No    Sexually Abused: No     Allergies  Allergen Reactions   Entresto  [Sacubitril-Valsartan ]     Cough    Lisinopril Cough     CBC    Component Value Date/Time   WBC 4.8 02/12/2024 0900   WBC 3.9 (L) 08/17/2023 0337   RBC 4.41 02/12/2024 0900   RBC 3.51 (L) 08/17/2023 0337   HGB 12.5 02/12/2024 0900   HCT 39.2 02/12/2024 0900   PLT 165 02/12/2024 0900   MCV 89 02/12/2024 0900   MCV 95 02/24/2013 1107   MCH 28.3 02/12/2024 0900   MCH 27.1 08/17/2023 0337   MCHC 31.9 02/12/2024 0900   MCHC 30.9 08/17/2023 0337   RDW 15.0 02/12/2024 0900   RDW 13.0 02/24/2013 1107   LYMPHSABS 1.3 02/12/2024 0900   LYMPHSABS 3.1 02/24/2013 1107   MONOABS 0.5 08/15/2023 1610   MONOABS 0.6 02/24/2013 1107   EOSABS 0.0 02/12/2024 0900   EOSABS 0.3 02/24/2013 1107   BASOSABS 0.0 02/12/2024 0900   BASOSABS 0.0 02/24/2013 1107    Pulmonary Functions Testing Results:    Latest Ref Rng & Units 06/11/2023    3:31 PM 05/30/2020   12:38 PM  PFT Results  FVC-Pre L 1.99    FVC-Predicted Pre % 69  75  P  FVC-Post L 2.05  2.45  P  FVC-Predicted Post % 71  82  P  Pre FEV1/FVC % % 71  77  P  Post FEV1/FCV % % 75  77  P  FEV1-Pre L 1.42  1.72  P  FEV1-Predicted Pre % 65  76  P  FEV1-Post L 1.53  1.89  P  DLCO uncorrected ml/min/mmHg 11.03  17.85  P  DLCO UNC% % 58  93  P  DLVA Predicted % 80  85  P  TLC L 4.62  4.55  P  TLC % Predicted % 94  92  P  RV % Predicted % 59  97  P    P Preliminary result    Outpatient  Medications Prior to Visit  Medication Sig Dispense Refill   amiodarone  (PACERONE ) 200 MG tablet Take 0.5 tablets (100 mg total) by mouth daily. 45 tablet 3   aspirin  EC 81 MG tablet Take 1 tablet (81 mg total) by mouth daily. Swallow whole. 30 tablet 11   Continuous Blood Gluc Receiver (FREESTYLE LIBRE 2 READER) DEVI 1 each by Does not apply route daily. 1 each 0   Continuous Glucose Sensor (FREESTYLE LIBRE  2 SENSOR) MISC 1 each by Does not apply route every 14 (fourteen) days. 12 each 1   cyclobenzaprine  (FLEXERIL ) 10 MG tablet Take 1 tablet (10 mg total) by mouth 3 (three) times daily as needed for muscle spasms. 90 tablet 1   furosemide  (LASIX ) 20 MG tablet Take 1 tablet (20 mg total) by mouth daily. 90 tablet 3   insulin  degludec (TRESIBA  FLEXTOUCH) 200 UNIT/ML FlexTouch Pen Reduce from 100 units to 70 units.     insulin  lispro (HUMALOG  KWIKPEN) 100 UNIT/ML KwikPen Inject 20 Units into the skin 3 (three) times daily. With meals. 60 mL 4   Insulin  Pen Needle 30G X 6 MM MISC 1 Application by Does not apply route daily. 100 each 2   Lancets (ONETOUCH DELICA PLUS LANCET30G) MISC TEST ONCE DAILY 100 each 0   levothyroxine  (SYNTHROID ) 88 MCG tablet Take 1 tablet (88 mcg total) by mouth daily. 90 tablet 1   metFORMIN  (GLUCOPHAGE ) 1000 MG tablet TAKE 1 TABLET BY MOUTH TWICE  DAILY WITH MEALS 200 tablet 0   metoprolol  succinate (TOPROL -XL) 25 MG 24 hr tablet TAKE 1 TABLET BY MOUTH DAILY (Patient taking differently: Take 12.5 mg by mouth daily. TAKE 1 TABLET BY MOUTH DAILY) 90 tablet 3   nitroGLYCERIN  (NITROSTAT ) 0.3 MG SL tablet Place 1 tablet (0.3 mg total) under the tongue every 5 (five) minutes as needed for chest pain. 100 tablet 3   ONETOUCH ULTRA test strip TEST ONCE DAILY 100 strip 3   pantoprazole  (PROTONIX ) 40 MG tablet Take 1 tablet (40 mg total) by mouth daily. 100 tablet 3   polyethylene glycol powder (GLYCOLAX /MIRALAX ) 17 GM/SCOOP powder Take 17 g by mouth daily.     QUEtiapine  (SEROQUEL )  200 MG tablet TAKE 1 TABLET BY MOUTH DAILY AT  BEDTIME 90 tablet 3   spironolactone  (ALDACTONE ) 25 MG tablet Take 0.5 tablets (12.5 mg total) by mouth daily. 50 tablet 3   traMADol  (ULTRAM ) 50 MG tablet Take 1 tablet (50 mg total) by mouth every 8 (eight) hours as needed for severe pain (pain score 7-10). 30 tablet 0   valsartan  (DIOVAN ) 40 MG tablet Take 0.5 tablets (20 mg total) by mouth daily. 45 tablet 1   No facility-administered medications prior to visit.

## 2024-05-28 MED ORDER — SPIRONOLACTONE 25 MG PO TABS: 25.0000 mg | ORAL_TABLET | Freq: Every day | ORAL | 3 refills | Status: DC

## 2024-06-01 ENCOUNTER — Other Ambulatory Visit (HOSPITAL_COMMUNITY): Payer: Self-pay | Admitting: Cardiology

## 2024-06-01 DIAGNOSIS — E785 Hyperlipidemia, unspecified: Secondary | ICD-10-CM

## 2024-06-03 ENCOUNTER — Other Ambulatory Visit: Payer: Self-pay | Admitting: Nurse Practitioner

## 2024-06-03 ENCOUNTER — Telehealth: Payer: Self-pay | Admitting: Nurse Practitioner

## 2024-06-03 NOTE — Telephone Encounter (Signed)
 Copied from CRM [id number]. Topic: Clinical - Medication Refill >> [name] 03, 2024 12:13 PM Wess RAMAN wrote: Medication: insulin  lispro (HUMALOG  KWIKPEN) 100 UNIT/ML KwikPen   Has the patient contacted their pharmacy? No (Agent: If no, request that the patient contact the pharmacy for the refill. If patient does not wish to contact the pharmacy document the reason why and proceed with request.) (Agent: If yes, when and what did the pharmacy advise?)  This is the patient's preferred pharmacy:   [facility] - [place], [place] - [address] [facility] [address] [place] [address] [place] [address] Phone: [phone] Fax: [phone]  Is this the correct pharmacy for this prescription? Yes If no, delete pharmacy and type the correct one.   Has the prescription been filled recently? Yes  Is the patient out of the medication? No  Has the patient been seen for an appointment in the last year OR does the patient have an upcoming appointment? Yes  Can we respond through MyChart? No  Agent: Please be advised that Rx refills may take up to 3 business days. We ask that you follow-up with your pharmacy.

## 2024-06-03 NOTE — Telephone Encounter (Signed)
 Copied from CRM #[id number]. Topic: Clinical - Medication Refill >> [name] 03, 2024 12:16 PM Wess RAMAN wrote: Medication: insulin  lispro (HUMALOG  KWIKPEN) 100 UNIT/ML KwikPen   Has the patient contacted their pharmacy? No (Agent: If no, request that the patient contact the pharmacy for the refill. If patient does not wish to contact the pharmacy document the reason why and proceed with request.) (Agent: If yes, when and what did the pharmacy advise?)  This is the patient's preferred pharmacy:   [facility] - [place], [place] - [address] [facility] [address] [place] [address] [place] [address] Phone: [phone] Fax: [phone]  Is this the correct pharmacy for this prescription? Yes If no, delete pharmacy and type the correct one.   Has the prescription been filled recently? Yes  Is the patient out of the medication? No  Has the patient been seen for an appointment in the last year OR does the patient have an upcoming appointment? Yes  Can we respond through MyChart? No  Agent: Please be advised that Rx refills may take up to 3 business days. We ask that you follow-up with your pharmacy.

## 2024-06-04 ENCOUNTER — Encounter

## 2024-06-04 ENCOUNTER — Other Ambulatory Visit

## 2024-06-05 ENCOUNTER — Other Ambulatory Visit: Payer: Self-pay | Admitting: Nurse Practitioner

## 2024-06-05 NOTE — Telephone Encounter (Signed)
 Requested medications are due for refill today.  No - pt wants rx sent to a different pharmacy  Requested medications are on the active medications list.  yes  Last refill. 05/19/2024 60mL 4 rf  Future visit scheduled.   no  Notes to clinic.  Current rx was for 1 year supply. Please review for refill to new pharmacy.    Requested Prescriptions  Pending Prescriptions Disp Refills   insulin  lispro (HUMALOG  KWIKPEN) 100 UNIT/ML KwikPen 60 mL 4    Sig: Inject 20 Units into the skin 3 (three) times daily. With meals.     Endocrinology:  Diabetes - Insulins Failed - 06/05/2024  2:58 PM      Failed - HBA1C is between 0 and 7.9 and within 180 days    Hemoglobin A1C  Date Value Ref Range Status  07/11/2023 8.0  Final    Comment:    care everywhere   Hgb A1c MFr Bld  Date Value Ref Range Status  05/19/2024 11.3 (H) 4.8 - 5.6 % Final    Comment:             Prediabetes: 5.7 - 6.4          Diabetes: >6.4          Glycemic control for adults with diabetes: <7.0    A1c  Date Value Ref Range Status  11/03/2023 7.1  Final    Comment:    ABSTRACTED BY HIM         Passed - Valid encounter within last 6 months    Recent Outpatient Visits           2 weeks ago Hypertension associated with diabetes [facility])   [place] [facility] Melvin Pao, NP   3 months ago Annual physical exam   [place] [facility] Melvin Pao, NP       Future Appointments             In 1 month Gollan, Timothy J, MD [facility]

## 2024-06-05 NOTE — Telephone Encounter (Unsigned)
 Copied from CRM #[id number]. Topic: Clinical - Medication Refill >> [name] 05, 2024  9:57 AM Turkey B wrote: Medication: insulin  lispro (HUMALOG  KWIKPEN) 100 UNIT/ML KwikPen  Has the patient contacted their pharmacy? no advise?)  This is the patient's preferred pharmacy:   [facility] - [place], [place] - [address] [place] [address] Phone: [phone]  Fax: [phone]   Is this the correct pharmacy for this prescription? yes   Has the prescription been filled recently? no  Is the patient out of the medication? yes  Has the patient been seen for an appointment in the last year OR does the patient have an upcoming appointment? yes  Can we respond through MyChart? yes  Agent: Please be advised that Rx refills may take up to 3 business days. We ask that you follow-up with your pharmacy.

## 2024-06-09 MED ORDER — INSULIN LISPRO (1 UNIT DIAL) 100 UNIT/ML (KWIKPEN): 20.0000 [IU] | PEN_INJECTOR | Freq: Three times a day (TID) | SUBCUTANEOUS | 4 refills | Status: DC

## 2024-06-09 NOTE — Telephone Encounter (Signed)
 Change of pharmacy  Requested Prescriptions  Pending Prescriptions Disp Refills   insulin  lispro (HUMALOG  KWIKPEN) 100 UNIT/ML KwikPen 60 mL 4    Sig: Inject 20 Units into the skin 3 (three) times daily. With meals.     Endocrinology:  Diabetes - Insulins Failed - 06/09/2024  7:44 AM      Failed - HBA1C is between 0 and 7.9 and within 180 days    Hemoglobin A1C  Date Value Ref Range Status  07/11/2023 8.0  Final    Comment:    care everywhere   Hgb A1c MFr Bld  Date Value Ref Range Status  05/19/2024 11.3 (H) 4.8 - 5.6 % Final    Comment:             Prediabetes: 5.7 - 6.4          Diabetes: >6.4          Glycemic control for adults with diabetes: <7.0    A1c  Date Value Ref Range Status  11/03/2023 7.1  Final    Comment:    ABSTRACTED BY HIM         Passed - Valid encounter within last 6 months    Recent Outpatient Visits           3 weeks ago Hypertension associated with diabetes [facility])   [facility] Melvin Pao, NP   3 months ago Annual physical exam   [facility] Melvin Pao, NP       Future Appointments             In 3 weeks Gollan, Timothy J, MD [facility] at [facility]

## 2024-06-11 ENCOUNTER — Other Ambulatory Visit: Payer: Self-pay | Admitting: Nurse Practitioner

## 2024-06-11 ENCOUNTER — Telehealth: Payer: Self-pay

## 2024-06-11 ENCOUNTER — Other Ambulatory Visit: Payer: Self-pay

## 2024-06-11 ENCOUNTER — Ambulatory Visit: Payer: Self-pay | Admitting: Student in an Organized Health Care Education/Training Program

## 2024-06-11 DIAGNOSIS — E1142 Type 2 diabetes mellitus with diabetic polyneuropathy: Secondary | ICD-10-CM

## 2024-06-11 MED ORDER — INSULIN LISPRO (1 UNIT DIAL) 100 UNIT/ML (KWIKPEN): 20.0000 [IU] | PEN_INJECTOR | Freq: Three times a day (TID) | SUBCUTANEOUS | 0 refills | Status: DC

## 2024-06-11 NOTE — Patient Outreach (Signed)
 Complex Care Management   Visit Note  06/11/2024  Name:  Kim Buchanan MRN: [id number] DOB: 09/29/1951  Situation: Referral received for Complex Care Management related to COPD and DM, and assistance with referral appointments to GI and Pulmonary Rehab.  I obtained verbal consent from Patient.  Visit completed with Patient  on the phone. Main concerns:  (1) she is completely out of Humalog , states an order was placed 8/19, per [facility], med is out for delivery with expected delivery date between 8/25-8/28.  This RNCM called PCP office to inform, they are sending an order to [employer] in [place].   (2) Patient isn't taking Anoro Ellipta  as ordered by Pulmonary due to $400 cost.   (3) Patient needs assistance making sure referral appointments have been set up with Pulmonary Rehab and [facility] GI, stating she is having difficulty keeping up with all of her appointments.   (4) [employer] will no longer be covering her glucometer/supplies, she needs an order for a glucometer that will be covered by [facility], patient has misplaced the letter that gave glucometer options.   (5) Denies assistance with making appointment with Dentist at this time.   Background:   Past Medical History:  Diagnosis Date   Anxiety    Arthritis    lower back   COPD (chronic obstructive pulmonary disease) (HCC)    Depression    Diabetes mellitus without complication (HCC)    GERD (gastroesophageal reflux disease)    Hyperlipidemia    Morbid obesity (HCC)    NICM (nonischemic cardiomyopathy) (HCC)    a. 03/2019 Echo: Nl LVEF; b. 09/04/2019 Echo: EF 25-30%; c. 09/11/2019 Cath: nonobs LAD/RCA dzs. EF 50-55%; c. 12/2020 Echo: EF 30-35%, glob HK. Mild LVH. Nl RVSP. Mild MR; d. 02/2021 RHC: Elev RH pressures. CO/CI 2.8/1.3; e. 06/2021 cMRI: EF 40%, marked sept-lat dyssynch & septal HK. Small, discrete areas of mid-wall LGE in apical lateral and apical septal walls - ? h/o myocarditis.   Nonobstructive CAD (coronary artery  disease)    a. 03/2019 [facility]): EF 63%, ant ischemia; b. 08/2019 Cath: LM nl, LAD 72m, RCA 70p (iFR nl @ 0.97), 50p. EF 50-55%-->Med Rx; c. 06/2020 Cor CTA: Nl FFRs throughout cor tree.   Osteoporosis    Panic attack    PTSD (post-traumatic stress disorder)    PVC's (premature ventricular contractions)    a. 05/2021 Zio: 20.6% PVC burden. 13.4 secs of NSVT-->amio added; b. 06/2021 Zio: 5.8% PVC burden. Rare supraventrciular ectopy.   Rectal fistula    Sleep apnea    a. Prev wore CPAP but lost insurance and hasn't been using since (now has insurance).   Wears dentures    full upper    Assessment: Patient Reported Symptoms:  Cognitive Cognitive Status: Alert and oriented to person, place, and time, Normal speech and language skills      Neurological Neurological Review of Symptoms: No symptoms reported    HEENT HEENT Symptoms Reported: No symptoms reported      Cardiovascular Cardiovascular Symptoms Reported: No symptoms reported    Respiratory Respiratory Symptoms Reported: No symptoms reported Other Respiratory Symptoms: has some shortness of breath with moderate activity such as walking outside. Not using Anoro inhaler due to $400 cost, could not pick up from pharmacy. Made referral for pharmacy consult. Respiratory Self-Management Outcome: 3 (uncertain)  Endocrine Endocrine Symptoms Reported: Hyperglycemia (A1C 11.3% on 05/19/24, increased from 8.0% on 07/11/2023) Is patient diabetic?: Yes Is patient checking blood sugars at home?: Yes List  most recent blood sugar readings, include date and time of day: Not able to assess, patient didn't have CGM sensor applied at this time. Endocrine Self-Management Outcome: 3 (uncertain) Endocrine Comment: She is currently out of Humalog , last dose taken was approximately 06/09/24. PCP ordered on 8/19 with [facility] delivery date between 8/25-8/28. Will contact [employer] for pricing.  Also states [employer] is discontinuing current blood glucose  monitor, she needs assistance ordering a covered glucometer for back up to [employer].  Sent pharmacy referral.  Gastrointestinal Gastrointestinal Symptoms Reported: No symptoms reported Additional Gastrointestinal Details: takes Miralax  as needed.      Genitourinary Genitourinary Symptoms Reported: No symptoms reported    Integumentary Integumentary Symptoms Reported: No symptoms reported    Musculoskeletal Musculoskelatal Symptoms Reviewed: No symptoms reported   Falls in the past year?: No    Psychosocial Psychosocial Symptoms Reported: No symptoms reported     Quality of Family Relationships: helpful, involved, supportive Do you feel physically threatened by others?: No    06/11/2024    PHQ2-9 Depression Screening   Little interest or pleasure in doing things Not at all  Feeling down, depressed, or hopeless Not at all  PHQ-2 - Total Score 0  Trouble falling or staying asleep, or sleeping too much    Feeling tired or having little energy    Poor appetite or overeating     Feeling bad about yourself - or that you are a failure or have let yourself or your family down    Trouble concentrating on things, such as reading the newspaper or watching television    Moving or speaking so slowly that other people could have noticed.  Or the opposite - being so fidgety or restless that you have been moving around a lot more than usual    Thoughts that you would be better off dead, or hurting yourself in some way    PHQ2-9 Total Score    If you checked off any problems, how difficult have these problems made it for you to do your work, take care of things at home, or get along with other people    Depression Interventions/Treatment      There were no vitals filed for this visit.  Medications Reviewed Today     Reviewed by Lucian Santana LABOR, RN (Registered Nurse) on 06/11/24 at 1156  Med List Status: <None>   Medication Order Taking? Sig Documenting Provider Last Dose Status Informant   amiodarone  (PACERONE ) 200 MG tablet [id number] Yes Take 0.5 tablets (100 mg total) by mouth daily. Rolan Ezra RAMAN, MD  Active   aspirin  EC 81 MG tablet [id number] Yes Take 1 tablet (81 mg total) by mouth daily. Swallow whole. Gerard Frederick, NP  Active   Continuous Blood Gluc Receiver (FREESTYLE LIBRE 2 READER) DEVI [id number] Yes 1 each by Does not apply route daily. Melvin Pao, NP  Active Multiple Informants  Continuous Glucose Sensor (FREESTYLE LIBRE 2 SENSOR) [place] [id number] Yes 1 each by Does not apply route every 14 (fourteen) days. Melvin Pao, NP  Active Multiple Informants  cyclobenzaprine  (FLEXERIL ) 10 MG tablet [id number] Yes Take 1 tablet (10 mg total) by mouth 3 (three) times daily as needed for muscle spasms. Melvin Pao, NP  Active   furosemide  (LASIX ) 20 MG tablet [id number] Yes Take 1 tablet (20 mg total) by mouth daily. Rolan Ezra RAMAN, MD  Active   insulin  degludec (TRESIBA  FLEXTOUCH) 200 UNIT/ML FlexTouch Pen [id number] Yes Reduce from 100 units to 70 units.  Patient taking differently: 80 Units 1 day or 1 dose. Reduce from 100 units to 70 units.   [place]anda [place], MD  Active            Med Note [facility], PAULA   Thu Jan 02, 2024  8:06 AM) 75 units daily  insulin  lispro (HUMALOG  [facility]) 100 UNIT/ML KwikPen [id number] Yes Inject 20 Units into the skin 3 (three) times daily. With meals. Melvin Pao, NP  Active   Insulin  Pen Needle 30G X 6 MM MISC [id number]  1 Application by Does not apply route daily. Melvin Pao, NP  Active Multiple Informants           Med Note ZENA, CHERYL A   Fri Apr 12, 2023  9:08 AM) 4 times daily   Lancets [facility] CATHRY[facility] PLUS [place]) MISC [id number]  TEST ONCE DAILY Melvin Pao, NP  Active   levothyroxine  (SYNTHROID ) 88 MCG tablet [id number] Yes Take 1 tablet (88 mcg total) by mouth daily. Melvin Pao, NP  Active   metFORMIN  (GLUCOPHAGE ) 1000 MG tablet [id number] Yes TAKE 1 TABLET BY MOUTH TWICE  DAILY WITH MEALS  Melvin Pao, NP  Active   metoprolol  succinate (TOPROL -XL) 25 MG 24 hr tablet [id number] Yes TAKE 1 TABLET BY MOUTH DAILY [place], Sheri, NP  Active   nitroGLYCERIN  (NITROSTAT ) 0.3 MG SL tablet [id number] Yes Place 1 tablet (0.3 mg total) under the tongue every 5 (five) minutes as needed for chest pain. Glena Harlene HERO, [place]  Active Multiple Informants  ONETOUCH ULTRA test strip [id number] Yes TEST ONCE DAILY Melvin Pao, NP  Active Multiple Informants  pantoprazole  (PROTONIX ) 40 MG tablet [id number] Yes Take 1 tablet (40 mg total) by mouth daily. Rolan Ezra RAMAN, MD  Active   polyethylene glycol powder (GLYCOLAX /MIRALAX ) 17 GM/SCOOP powder [id number] Yes Take 17 g by mouth daily. [provider]  Active Self, Multiple Informants  QUEtiapine  (SEROQUEL ) 200 MG tablet [id number] Yes TAKE 1 TABLET BY MOUTH DAILY AT  BEDTIME Melvin Pao, NP  Active Multiple Informants  spironolactone  (ALDACTONE ) 25 MG tablet [id number] Yes Take 1 tablet (25 mg total) by mouth daily. [place]ette [place] LABOR, FNP  Active   traMADol  (ULTRAM ) 50 MG tablet [id number] Yes Take 1 tablet (50 mg total) by mouth every 8 (eight) hours as needed for severe pain (pain score 7-10). Melvin Pao, NP  Active   umeclidinium-vilanterol (ANORO ELLIPTA ) 62.5-25 MCG/ACT AEPB [id number]  Inhale 1 puff into the lungs daily. Isadora Hose, MD  Active   valsartan  (DIOVAN ) 40 MG tablet [id number]  Take 0.5 tablets (20 mg total) by mouth daily. Rolan Ezra RAMAN, MD  Active             Recommendation:   Pharmacy televisit with Channing Mealing [facility] scheduled 06/15/24 PCP follow up appointment scheduled for 08/21/24 Specialty provider follow-up Cardiology 07/06/24. Pulmonary 08/25/24,  Referral to: Pharmacy for Anoro ellipta  PAP/best cost, and to assist with ordering covered glucometer through [facility].   Needs appointments set up for referrals: [facility] GI as referred by [facility]) at [facility] 8/5, and Pulmonary Rehab  as referred by Pulmonary at [facility] 05/27/24. Patient needs to pick up Humalog  from [place] in [place] until she receives the insulin  from [facility] scheduled for delivery between 8/25 and 8/28 (this RNCM made call to PCP office to send script to local pharmacy).          Follow Up Plan:   Telephone follow-up today to check on Humalog  status, and then two  weeks.   Santana Stamp BSN, CCM [place]  [facility] RN Care Manager Direct Dial : [phone]  Fax: [phone]

## 2024-06-11 NOTE — Progress Notes (Signed)
   06/11/2024  Patient ID: Kim Buchanan, female   DOB: 30-Apr-1951, 73 y.o.   MRN: [id number]  RNCM notified me that patient is out of her Humalog , and the mail order pharmacy may not have it delivered until next week.  Order was sent to [employer], but insurance will not cover since mail order pharmacy has already filled.  Contacted [facility], and medication was actually shipped yesterday and delivered this afternoon.  Contacted patient, and she was able to verify delivery was on the porch.    Kim Buchanan, PharmD, DPLA

## 2024-06-11 NOTE — Telephone Encounter (Signed)
 Copied from CRM #[id number]. Topic: Clinical - Medication Refill >> [name] 11, 2024  1:39 PM Tiffini S wrote: Medication: insulin  lispro (HUMALOG  KWIKPEN) 100 UNIT/ML KwikPen  Has the patient contacted their pharmacy? Yes (Agent: If no, request that the patient contact the pharmacy for the refill. If patient does not wish to contact the pharmacy document the reason why and proceed with request.) (Agent: If yes, when and what did the pharmacy advise?)  This is the patient's preferred pharmacy:  [facility] - [place], [place] - [address] [place] [address] Phone: [phone] Fax: [phone]  Is this the correct pharmacy for this prescription? Yes If no, delete pharmacy and type the correct one.   Has the prescription been filled recently? Yes  Is the patient out of the medication? Yes  Has the patient been seen for an appointment in the last year OR does the patient have an upcoming appointment? Yes  Can we respond through MyChart? Yes  Agent: Please be advised that Rx refills may take up to 3 business days. We ask that you follow-up with your pharmacy.

## 2024-06-11 NOTE — Telephone Encounter (Signed)
 Medication was recently sent to mail order. Can we also send to local pharmacy as well.

## 2024-06-11 NOTE — Patient Instructions (Signed)
 Visit Information  Thank you for taking time to visit with me today. Please don't hesitate to contact me if I can be of assistance to you before our next scheduled appointment.  Our next appointment is by telephone on Thursday, September 4th at 10:30am.  Please call the care guide team at [phone] if you need to cancel or reschedule your appointment.   Following is a copy of your care plan:   Goals Addressed             This Visit's Progress    VBCI RN Care Plan       Problems:  Chronic Disease Management support and education needs related to DMII  Goal: Over the next 3 months the Patient will demonstrate Improved adherence to prescribed treatment plan for DMII as evidenced by decrease in A1C  Interventions:   Diabetes Interventions: Assessed patient's understanding of A1c goal: <7% Provided education to patient about basic DM disease process Reviewed medications with patient and discussed importance of medication adherence Provided patient with written educational materials related to hypo and hyperglycemia and importance of correct treatment Advised patient, providing education and rationale, to check cbg at lease twice a day and record, calling PCP for findings outside established parameters Referral made to pharmacy team for assistance with Humalog , acute need due to patient is completely out, [facility] will be delivering between 8/25 and 06/18/24.  Review of patient status, including review of consultants reports, relevant laboratory and other test results, and medications completed Screening for signs and symptoms of depression related to chronic disease state  Assessed social determinant of health barriers Referral made to pharmacy to assist with ordering new glucometer as [employer] will stop covering existing monitor.   Lab Results  Component Value Date   HGBA1C 11.3 (H) 05/19/2024    Patient Self-Care Activities:  Attend all scheduled provider appointments Call  provider office for new concerns or questions  Take medications as prescribed   Work with the pharmacist to address medication management needs and will continue to work with the clinical team to address health care and disease management related needs  Plan:  Telephone follow up appointment with care management team member scheduled for:  two weeks.          VBCI RN Care Plan       Problems:  Knowledge Deficits related to COPD  Goal: Over the next 30 days the Patient will work with pharmacist to address Financial constraints related to cost ($400) of  Anoro Ellipta   related to COPD as evidenced by review of electronic medical record and patient or pharmacist report     Interventions:   COPD Interventions: Referral made to Pharmacy to inquire of cost effective alternative for Anoro ellipta   Patient Self-Care Activities:  Attend all scheduled provider appointments Call pharmacy for medication refills 3-7 days in advance of running out of medications Call provider office for new concerns or questions  Work with the pharmacist to address medication management needs and will continue to work with the clinical team to address health care and disease management related needs  Plan:  Telephone follow up appointment with care management team member scheduled for:  two weeks.           VBCI RN Care Plan       Problems:  Non-adherence to scheduled provider appointments  Goal: Over the next 3 months the Patient will attend all scheduled medical appointments: Pulmonary Rehab as ordered by Pulmonary on 05/27/24, GI referral to  [facility] as ordered by Cardiology on 05/26/24,  as evidenced by appointments set up and patient has noted on her calendar.          Interventions:   Health Maintenance Interventions: Discussed calling GI at [facility] and Pulmonary Rehab if no call from them in the next week.   Patient Self-Care Activities:  Attend all scheduled provider  appointments Call provider office for new concerns or questions   Plan:  Telephone follow up appointment with care management team member scheduled for:  two weeks.             Please call the [place]  National Suicide Prevention Lifeline: 917-125-3544 or TTY: 618-827-5062 TTY 8043693335) to talk to a trained counselor if you are experiencing a Mental Health or Behavioral Health Crisis or need someone to talk to.  Patient verbalizes understanding of instructions and care plan provided today and agrees to view in MyChart. Active MyChart status and patient understanding of how to access instructions and care plan via MyChart confirmed with patient.     Santana Stamp BSN, CCM [place]  VBCI Population Health RN Care Manager Direct Dial : [phone]  Fax: [phone]

## 2024-06-11 NOTE — Telephone Encounter (Signed)
 Pharmacy team please check on cheaper alternative for Anoro Ellipta . Patient reports her co-pay was around $400.   Thank you,

## 2024-06-12 ENCOUNTER — Other Ambulatory Visit (HOSPITAL_COMMUNITY): Payer: Self-pay

## 2024-06-12 NOTE — Telephone Encounter (Signed)
 LVM to cal back if any further questions but relayed that pt has a deductible to meet which is driving up prescription cost.

## 2024-06-15 ENCOUNTER — Other Ambulatory Visit: Payer: Self-pay

## 2024-06-15 DIAGNOSIS — E1142 Type 2 diabetes mellitus with diabetic polyneuropathy: Secondary | ICD-10-CM

## 2024-06-15 MED ORDER — FREESTYLE LIBRE 2 PLUS SENSOR MISC: 4 refills | Status: DC

## 2024-06-15 NOTE — Progress Notes (Signed)
   06/15/2024  Patient ID: Kim Buchanan, female   DOB: 12-08-1950, 73 y.o.   MRN: [id number]  Subjective/Objective Telephone visit to follow-up on management of diabetes  Diabetes -Current medications:  Tresiba  80 units HS, Humalog  20 units TID with meals, metformin  1000mg  BID, glimepiride  4mg  with breakfast --Medications tried in the past: Ozempic (patient does not wish to resume) -Diabetes is managed by [facility], but patient will not follow-up again until insurance changes next year (copay currently too expensive) -A1c of 11.3% 7/29 -Humalog  increased after 8/19 PCP visit and glimepiride  added on -Patient is using [place] [employer] for CGM and will need updated script for [place] 2+ sensors since [place] 2 no longer being made -Also states she received a letter from insurance stating One Touch products will no longer be covered by her plan -Endorses average BG, both fasting and post-prandial, remains in the 200's even with recent medication changes  Assessment/Plan  Diabetes -Continue current regimen since just started glimepiride  and increased Humalog  to 20 units TID < 1 week ago -Libre 2+ sensors sent to pharmacy under [facility] standing order -Test claim for One Touch testing supplies indicates supplies will now need to be billed to Medicare B.  If [facility] cannot do so, supplies can be sent to DME supplier through [place] -Will check in with patient in another 2 weeks to see if BG has started to improve.  If not, will need to adjust insulin  or consider addition of another agent (could try Rybelsus-likely [facility] has sample and she could get med through [facility] PAP)  Follow-up:  2 weeks  [name], PharmD, DPLA

## 2024-06-25 ENCOUNTER — Other Ambulatory Visit: Payer: Self-pay

## 2024-06-25 NOTE — Patient Outreach (Signed)
 Attempted to contact [facility] at [facility] to request referral to [facility] by Ellouise Class, FNP on 05/26/24 be resent due to [facility] clinic did not receive.

## 2024-06-25 NOTE — Patient Outreach (Signed)
 [facility] at [facility], [phone].  Spoke with Megan, states they have not received a referral for patient,  requesting referral to be resent.  This RNCM will call [facility] to inform.

## 2024-06-26 NOTE — Patient Instructions (Signed)
 Visit Information  Thank you for taking time to visit with me today. Please don't hesitate to contact me if I can be of assistance to you before our next scheduled appointment.  Your next care management appointment is by telephone on Thursday, September 17th at 10:30am.     Please call the care guide team at [phone] if you need to cancel, schedule, or reschedule an appointment.   A reminder to ALL patients/family/friends, please call the [place]  National Suicide Prevention Lifeline: 340-660-1427 or TTY: 763-425-7309 TTY 825 354 4722) to talk to a trained counselor if you are experiencing a Mental Health or Behavioral Health Crisis or need someone to talk to.  Santana Stamp BSN, CCM [place]  VBCI Population Health RN Care Manager Direct Dial : [phone]  Fax: [phone]

## 2024-06-26 NOTE — Patient Outreach (Addendum)
 Complex Care Management   Visit Note  06/26/2024  Name:  Kim Buchanan MRN: [id number] DOB: [birth year]  Situation: Referral received for Complex Care Management related to Health Maintenance.  I obtained verbal consent from Patient.  Visit completed with Kim Buchanan  on the phone.  Patient states she has not heard from Maryl Plough for new patient appointment due to elevated LFT's that were checked 05/15/24.  States her fasting blood sugars are still around 200's,she is unable to view averages/in-target range on her FreeStyle Libre for better assessment of blood sugar averages.  Channing Mealing, [facility], is following for DM as well.  States she is taking medications as ordered.    Background:   Past Medical History:  Diagnosis Date   Anxiety    Arthritis    lower back   COPD (chronic obstructive pulmonary disease) (HCC)    Depression    Diabetes mellitus without complication (HCC)    GERD (gastroesophageal reflux disease)    Hyperlipidemia    Morbid obesity (HCC)    NICM (nonischemic cardiomyopathy) (HCC)    a. 03/2019 Echo: Nl LVEF; b. 09/04/2019 Echo: EF 25-30%; c. 09/11/2019 Cath: nonobs LAD/RCA dzs. EF 50-55%; c. 12/2020 Echo: EF 30-35%, glob HK. Mild LVH. Nl RVSP. Mild MR; d. 02/2021 RHC: Elev RH pressures. CO/CI 2.8/1.3; e. 06/2021 cMRI: EF 40%, marked sept-lat dyssynch & septal HK. Small, discrete areas of mid-wall LGE in apical lateral and apical septal walls - ? h/o myocarditis.   Nonobstructive CAD (coronary artery disease)    a. 03/2019 [facility]): EF 63%, ant ischemia; b. 08/2019 Cath: LM nl, LAD 56m, RCA 70p (iFR nl @ 0.97), 50p. EF 50-55%-->Med Rx; c. 06/2020 Cor CTA: Nl FFRs throughout cor tree.   Osteoporosis    Panic attack    PTSD (post-traumatic stress disorder)    PVC's (premature ventricular contractions)    a. 05/2021 Zio: 20.6% PVC burden. 13.4 secs of NSVT-->amio added; b. 06/2021 Zio: 5.8% PVC burden. Rare supraventrciular ectopy.   Rectal fistula    Sleep apnea    a.  Prev wore CPAP but lost insurance and hasn't been using since (now has insurance).   Wears dentures    full upper    Assessment: Patient Reported Symptoms:  Cognitive Cognitive Status: Alert and oriented to person, place, and time, Normal speech and language skills      Neurological Neurological Review of Symptoms: Not assessed    HEENT HEENT Symptoms Reported: Not assessed      Cardiovascular Cardiovascular Symptoms Reported: No symptoms reported    Respiratory Respiratory Symptoms Reported: No symptoms reported Other Respiratory Symptoms: Continues to have some shortness of breath with moderate activity such as walking outside - this is at baseline.States she is not going to use Anoro inhaler due to cost.  Will have f/u appt with Pulmonary, MD is aware of patient not using inhaler (see phone conversation). She has declined Pulmonary Rehab, states xray of my lungs says there are no problems with my lungs.    Endocrine Endocrine Symptoms Reported: Hyperglycemia Is patient diabetic?: Yes Is patient checking blood sugars at home?: Yes List most recent blood sugar readings, include date and time of day: Reports this morning's FBG: 200. Endocrine Comment: She received Humalog  the same day (06/11/24), patient states she is taking as prescribed  Gastrointestinal Gastrointestinal Symptoms Reported: Not assessed      Genitourinary Genitourinary Symptoms Reported: Not assessed    Integumentary Integumentary Symptoms Reported: Not assessed    Musculoskeletal Musculoskelatal  Symptoms Reviewed: Not assessed        Psychosocial Psychosocial Symptoms Reported: Not assessed          06/26/2024    PHQ2-9 Depression Screening   Little interest or pleasure in doing things    Feeling down, depressed, or hopeless    PHQ-2 - Total Score    Trouble falling or staying asleep, or sleeping too much    Feeling tired or having little energy    Poor appetite or overeating     Feeling bad about  yourself - or that you are a failure or have let yourself or your family down    Trouble concentrating on things, such as reading the newspaper or watching television    Moving or speaking so slowly that other people could have noticed.  Or the opposite - being so fidgety or restless that you have been moving around a lot more than usual    Thoughts that you would be better off dead, or hurting yourself in some way    PHQ2-9 Total Score    If you checked off any problems, how difficult have these problems made it for you to do your work, take care of things at home, or get along with other people    Depression Interventions/Treatment      There were no vitals filed for this visit.  Medications Reviewed Today     Reviewed by Lucian Santana LABOR, RN (Registered Nurse) on 06/26/24 at [phone]  Med List Status: <None>   Medication Order Taking? Sig Documenting Provider Last Dose Status Informant  amiodarone  (PACERONE ) 200 MG tablet [id number]  Take 0.5 tablets (100 mg total) by mouth daily. Rolan Ezra RAMAN, MD  Active   aspirin  EC 81 MG tablet [id number]  Take 1 tablet (81 mg total) by mouth daily. Swallow whole. Gerard Frederick, NP  Active   Continuous Blood Gluc Receiver (FREESTYLE LIBRE 2 READER) DEVI [id number] Yes 1 each by Does not apply route daily. Melvin Pao, NP  Active Multiple Informants  Continuous Glucose Sensor (FREESTYLE LIBRE 2 PLUS SENSOR) MISC [id number] Yes Change sensor every 15 days. Melvin Pao, NP  Active   cyclobenzaprine  (FLEXERIL ) 10 MG tablet [id number]  Take 1 tablet (10 mg total) by mouth 3 (three) times daily as needed for muscle spasms. Melvin Pao, NP  Active   furosemide  (LASIX ) 20 MG tablet [id number]  Take 1 tablet (20 mg total) by mouth daily. Rolan Ezra RAMAN, MD  Active   glimepiride  (AMARYL ) 4 MG tablet [id number] Yes Take 4 mg by mouth daily with breakfast. [provider]  Active   insulin  degludec (TRESIBA  FLEXTOUCH) 200 UNIT/ML FlexTouch Pen  [id number] Yes Reduce from 100 units to 70 units. Awanda [place], MD  Active            Med Note ZENA, [facility] A   Mon [name] 15, 2024 11:35 AM) Gets through [facility] PAP- gets sent to [facility] clinic  insulin  lispro (HUMALOG  [facility]) 100 UNIT/ML KwikPen [id number] Yes Inject 20 Units into the skin 3 (three) times daily. With meals. Melvin Pao, NP  Active   Insulin  Pen Needle 30G X 6 MM MISC [id number]  1 Application by Does not apply route daily. Melvin Pao, NP  Active Multiple Informants           Med Note ZENA, CHERYL A   Fri Apr 12, 2023  9:08 AM) 4 times daily   Lancets [facility] CATHRYNE PLUS [place]) MISC [id number]  TEST ONCE DAILY Melvin Pao,  NP  Active   levothyroxine  (SYNTHROID ) 88 MCG tablet [id number]  Take 1 tablet (88 mcg total) by mouth daily. Melvin Pao, NP  Active   metFORMIN  (GLUCOPHAGE ) 1000 MG tablet [id number] Yes TAKE 1 TABLET BY MOUTH TWICE  DAILY WITH MEALS Melvin Pao, NP  Active   metoprolol  succinate (TOPROL -XL) 25 MG 24 hr tablet [id number]  TAKE 1 TABLET BY MOUTH DAILY  Patient taking differently: Take 12.5 mg by mouth daily. TAKE 1 TABLET BY MOUTH DAILY   Hammock, Sheri, NP  Active   nitroGLYCERIN  (NITROSTAT ) 0.3 MG SL tablet [id number]  Place 1 tablet (0.3 mg total) under the tongue every 5 (five) minutes as needed for chest pain. Glena Harlene HERO, [place]  Active Multiple Informants  ONETOUCH ULTRA test strip [id number]  TEST ONCE DAILY Melvin Pao, NP  Active Multiple Informants  pantoprazole  (PROTONIX ) 40 MG tablet [id number]  Take 1 tablet (40 mg total) by mouth daily. Rolan Ezra RAMAN, MD  Active   polyethylene glycol powder (GLYCOLAX /MIRALAX ) 17 GM/SCOOP powder [id number]  Take 17 g by mouth daily. [provider]  Active Self, Multiple Informants  QUEtiapine  (SEROQUEL ) 200 MG tablet [id number]  TAKE 1 TABLET BY MOUTH DAILY AT  BEDTIME Melvin Pao, NP  Active Multiple Informants  spironolactone  (ALDACTONE ) 25 MG tablet  [id number]  Take 1 tablet (25 mg total) by mouth daily. Donette Ellouise LABOR, FNP  Active   traMADol  (ULTRAM ) 50 MG tablet [id number]  Take 1 tablet (50 mg total) by mouth every 8 (eight) hours as needed for severe pain (pain score 7-10). Melvin Pao, NP  Active   valsartan  (DIOVAN ) 40 MG tablet [id number]  Take 0.5 tablets (20 mg total) by mouth daily. Rolan Ezra RAMAN, MD  Active             Recommendation:   Reminded patients of upcoming appointments:  Channing Mealing, [facility], 06/29/24 Specialty provider follow-up HF Clinic 06/30/24, Cardiology 07/06/24, Pulmonary 08/25/24 This RNCM will await call from office of Tina Hackney/HF clinic regarding resending order for referral to [facility].   Follow Up Plan:   Telephone follow-up two weeks.  Santana Stamp BSN, CCM [place]  VBCI Population Health RN Care Manager Direct Dial : [phone]  Fax: [phone]

## 2024-06-28 ENCOUNTER — Other Ambulatory Visit: Payer: Self-pay | Admitting: Nurse Practitioner

## 2024-06-29 ENCOUNTER — Other Ambulatory Visit: Payer: Self-pay

## 2024-06-29 ENCOUNTER — Telehealth: Payer: Self-pay | Admitting: Family

## 2024-06-29 NOTE — Progress Notes (Unsigned)
 PCP: Melvin Pao, NP Cardiology: Dr. Perla HF Cardiology: Dr. Rolan  Chief complaint: shortness of breath   HPI:   [name] is a 73 y.o. female with a history of chronic systolic CHF/nonischemic cardiomyopathy, type 2 diabetes, and prior smoking was referred by Dr. Cindie for evaluation of CHF.  She has been noted to have a cardiomyopathy since 2020.  Echo in 11/20 showed EF 25-30%.  LHC in 11/20 and coronary CTA in 9/21 have shown nonobstructive CAD.  She has been evaluated by pulmonary, mild restrictive lung disease and mild COPD.  She quit smoking in 2020, she does not drink ETOH.  Echo in 3/22 showed EF 30-35%, with normal RV.  RHC in 5/22 was worrisome, showing right > left heart failure and low cardiac output with low PAPI.   Zio patch in 8/22 showed 21% PVCs.  Amiodarone  was started.  Cardiac MRI in 9/22 showed EF 40%, septal-lateral dyssynchrony, RV EF 45%, LGE in the mid-wall apical lateral and apical septal walls. Repeat Zio patch in 10/22 showed 5.8% PVCs, improved.    Patient stopped Farxiga  after 3 yeast infections.   Echo in 5/23 showed EF 35-40%, mildly decreased RV systolic function, trivial MR.  Echo in 6/24 showed EF up to 50-55%, normal RV, mild MR, IVC normal. PFTs showed mild obstruction from emphysema.   Patient was admitted in 10/24 with hypotension and AKI. Midodrine  was started and atorvastatin  (LFTs were elevated), Protonix , spironolactone , torsemide , and valsartan  were all stopped.  She was eventually titrated off midodrine .  Echo this admission showed EF 60-65% with normal RV size and systolic function.   Seen in [facility] 07/25 where ReDs reading was elevated. Lasix  40mg  was given for 3 days with reduction to 20mg . Valsartan  was started.   She presents today, with her husband, for a HF follow-up visit with worsening shortness of breath, primarily on exertion. She endorses occasional palpitations with no specific origin. She denies chest pain, dizziness, and  productive cough.   She endorses no interruptions in her sleep and sleeps with 2 pillows. Diet consists of unsweetened tea and diet drinks , limited salt intake. Weight is stable per patient. Endorses no smoking since approx. 2018.   Recent pulmonology, no abnormal findings   Labs (6/22): K 4.4, creatinine 0.79 Labs (2/23): K 4.8, creatinine 0.95, LDL 18, TSH 5.37, ALT 102, AST 95, hgb 15.2 Labs (8/23): LDL 74, K 4.3, creatinine 9.07, AST 33, ALT 46 Labs (2/24): LDL 19, TGs 173 Labs (3/24): K 4.9, creatinine 0.99, AST 127, ALT 132 Labs (5/24): K 4.7, creatinine 1.07, AST 147, ALT 73 Labs (6/24): TSH 9.58, LDL 26 Labs (11/24): K 4.4, creatinine 0.85, AST 83, ALT 99 Labs (12/24): TSH mildly elevated Labs (4/25): LDL 27, AST 84, ALT 82, alkaline phosphatase 212, TSH normal Labs (07/25): K 4.1/ 4.2, creatinine 0.54/ 0.68, LDL 25, A1c 11.3%, TSH normal  ECG not done  PMH: 1. Chronic systolic CHF: Nonischemic cardiomyopathy.   - Echo (6/20): Normal EF - Cardiolite (6/20): Anterior ischemia.  - Echo (11/20): EF 25-30% - LHC (11/20): 70% pRCA stenosis, FFR negative.  - Echo (8/21): EF 30-35% - Coronary CTA (9/21): Mild RCA disease, 40-59% mLAD stenosis but CT FFR negative.  - Echo (3/22): EF 30-35% with normal RV - RHC (5/22): mean RA 17, mean PA 28, mean PCWP 20, CI 1.3, PAPI < 1.  - Cardiac MRI (9/22): EF 40%, septal-lateral dyssynchrony, RV EF 45%, LGE in the mid-wall apical lateral and apical septal walls. -  Echo (5/23): EF 35-40%, mildly decreased RV systolic function, trivial MR.   - Echo (6/24): EF up to 50-55%, normal RV, mild MR, IVC normal - Echo (10/24): EF 60-65% with normal RV size and systolic function.  2. COPD: Mild.  Quit smoking in 2020.  - PFTs with mild obstruction consistent with emphysema.  3. Elevated LFTs: ?NAFLD - Abdominal US  (2/24): Fatty liver.  - HCV negative 4. Type 2 diabetes 5. Coronary artery disease: Nonobstructive.  6. Hyperlipidemia 7. OSA 8.  GERD 9. Depression 10. PVCs: Zio patch in 8/22 showed 21% PVCs.  Amiodarone  was started. Repeat Zio patch in 10/22 showed 5.8% PVCs, improved.   11. Sleep study (8/22): No OSA.   FH: Brother with heart surgery, ?CHF.  SH: Married, no ETOH, quit smoking in 2020, retired, lives in [place]  ROS: All systems reviewed and negative except as per HPI.   Current Outpatient Medications  Medication Sig Dispense Refill   amiodarone  (PACERONE ) 200 MG tablet Take 0.5 tablets (100 mg total) by mouth daily. 45 tablet 3   aspirin  EC 81 MG tablet Take 1 tablet (81 mg total) by mouth daily. Swallow whole. 30 tablet 11   Continuous Blood Gluc Receiver (FREESTYLE LIBRE 2 READER) DEVI 1 each by Does not apply route daily. 1 each 0   Continuous Glucose Sensor (FREESTYLE LIBRE 2 PLUS SENSOR) MISC Change sensor every 15 days. 6 each 4   cyclobenzaprine  (FLEXERIL ) 10 MG tablet Take 1 tablet (10 mg total) by mouth 3 (three) times daily as needed for muscle spasms. 90 tablet 1   furosemide  (LASIX ) 20 MG tablet Take 1 tablet (20 mg total) by mouth daily. 90 tablet 3   glimepiride  (AMARYL ) 4 MG tablet Take 4 mg by mouth daily with breakfast.     insulin  degludec (TRESIBA  FLEXTOUCH) 200 UNIT/ML FlexTouch Pen Reduce from 100 units to 70 units.     insulin  lispro (HUMALOG  KWIKPEN) 100 UNIT/ML KwikPen Inject 20 Units into the skin 3 (three) times daily. With meals. 60 mL 0   Insulin  Pen Needle 30G X 6 MM MISC 1 Application by Does not apply route daily. 100 each 2   Lancets (ONETOUCH DELICA PLUS LANCET30G) MISC TEST ONCE DAILY 100 each 0   levothyroxine  (SYNTHROID ) 88 MCG tablet Take 1 tablet (88 mcg total) by mouth daily. 90 tablet 1   metFORMIN  (GLUCOPHAGE ) 1000 MG tablet TAKE 1 TABLET BY MOUTH TWICE  DAILY WITH MEALS 200 tablet 0   metoprolol  succinate (TOPROL -XL) 25 MG 24 hr tablet TAKE 1 TABLET BY MOUTH DAILY (Patient taking differently: Take 12.5 mg by mouth daily. TAKE 1 TABLET BY MOUTH DAILY) 90 tablet 3    nitroGLYCERIN  (NITROSTAT ) 0.3 MG SL tablet Place 1 tablet (0.3 mg total) under the tongue every 5 (five) minutes as needed for chest pain. 100 tablet 3   ONETOUCH ULTRA test strip TEST ONCE DAILY 100 strip 3   pantoprazole  (PROTONIX ) 40 MG tablet Take 1 tablet (40 mg total) by mouth daily. 100 tablet 3   polyethylene glycol powder (GLYCOLAX /MIRALAX ) 17 GM/SCOOP powder Take 17 g by mouth daily.     QUEtiapine  (SEROQUEL ) 200 MG tablet TAKE 1 TABLET BY MOUTH DAILY AT  BEDTIME 90 tablet 3   spironolactone  (ALDACTONE ) 25 MG tablet Take 1 tablet (25 mg total) by mouth daily. 90 tablet 3   traMADol  (ULTRAM ) 50 MG tablet Take 1 tablet (50 mg total) by mouth every 8 (eight) hours as needed for severe pain (pain score 7-10).  30 tablet 0   valsartan  (DIOVAN ) 40 MG tablet Take 0.5 tablets (20 mg total) by mouth daily. 45 tablet 1   No current facility-administered medications for this visit.   Vitals:   06/30/24 0923  BP: (!) 128/49  Pulse: 83  SpO2: 95%  Weight: 224 lb 2 oz (101.7 kg)   Wt Readings from Last 3 Encounters:  06/30/24 224 lb 2 oz (101.7 kg)  05/27/24 220 lb 3.2 oz (99.9 kg)  05/26/24 218 lb 8 oz (99.1 kg)    Lab Results  Component Value Date   CREATININE 0.68 05/26/2024   CREATININE 0.68 05/19/2024   CREATININE 0.54 05/14/2024   Physical Exam:   General: Well appearing. Mild dyspnea at rest.  Cor: No JVD. Regular rhythm, rate.  Lungs: clear Abdomen: soft, nontender, moderately distended,  Extremities:No pedal edema Neuro:. Alert and oriented x 3. Affect pleasant  06/30/24: ReDs reading: 37 %, abnormal   05/26/24: ReDs reading: 34 %, normal   Assessment/Plan:  1. Chronic systolic CHF: Nonischemic cardiomyopathy.  Echo in 3/22 with EF 30-35%, normal RV.  LHC in 11/20 and coronary CTA in 9/21 showed nonobstructive CAD.  RHC in 5/22 was worrisome with R>L heart failure and low cardiac output as well as low PAPI.  Cardiac MRI in 9/22 showed EF 40%, septal-lateral dyssynchrony,  RV EF 45%, LGE in the mid-wall apical lateral and apical septal walls.  This was concerning for possible myocarditis (less likely amyloidosis or sarcoidosis).  Echo in 5/23 with EF 35-40%, mildly decreased RV systolic function, trivial MR.  Also concern that PVCs may play a role as Zio monitor in 8/22 showed 21% PVCs, improved to 5.8% with amiodarone .  Echo in 6/24 showed EF up to 50-55%, normal RV, mild MR, IVC normal.  Improvement in EF may be related to suppression of PVCs. Echo in 10/24 showed normal EF 60-65% with normal RV.   - NYHA class III - fluid up with elevated ReDs reading and worsening SOB - ReDs today is 37% - Updated echo scheduled for 10/25 - Increase Lasix  to 60mg  daily X 3 days and then resume 20mg  daily. BMET today - Continue valsartan  20 mg daily. Cough w/ entresto  - Continue spironolactone  25 daily.  - Off dapagliflozin  with multiple yeast infections.  - Continue Toprol  XL to 25 mg daily.  - BNP 05/14/24 was 70.9.  - BMET 05/26/24 reviewed: sodium 136, potassium 4.1, creatinine 0.68 and GFR >60  2. CAD: nonobstructive by LHC in 11/20 and coronary CTA in 9/21.  - Off atorvastatin  with elevated LFTs.  Last LDL in 07/25 was 25.  - Continue ASA 81 daily.   3. PVCs: 21% PVCs on 8/22 Zio monitor.  ?Playing role in cardiomyopathy. PVCs down to 5.8% on Zio in 10/22 on amiodarone .  This may have led to improvement in EF by echo in 6/24 and in 10/24.  - Continue amiodarone  100 mg daily.  On Levoxyl  for hypothyroidism managed by PCP.  Also has chronic mild elevation in LFTs.  4. Elevated LFTs: This appears to be chronic and mild.  LFT elevation pre-dates amiodarone  use, and amiodarone  does not seem to have worsened LFTs.  Suspect NAFLD.  She had workup for viral hepatitis (negative) in the past.   - Statin was stopped. Amiodarone  at lowest dose (100 mg daily).  - LFT's checked 07/25 and still elevated. GI referral to [facility] placed at last visit and she is awaiting an  appointment.  - As long as there is  not a significant rise, continue amiodarone  at low dose. Could consider changing to mexiletine.  - RUQ US  05/21/24:  Diffusely echogenic hepatic parenchyma consistent with fatty liver infiltration  - drank alcohol as teenager/ young adult but says that it wasn't excessive  5. COPD: Prior smoker.  I suspect COPD plays a role in her dyspnea. PFTs with mild obstruction suggesting a degree of emphysema.  - Follow with pulmonary.    Return in 1 week   Ellouise Class, FNP-C/Bessie Livingood, FNP-S 06/30/24

## 2024-06-29 NOTE — Progress Notes (Unsigned)
   06/29/2024  Patient ID: Kim Buchanan, female   DOB: 02-21-1951, 73 y.o.   MRN: [id number]  Outreach attempt for scheduled telephone visit to follow-up on management of diabetes.  I was not able to reach the patient, but I did leave a HIPAA compliant voicemail with my direct phone number.  I will try to contact again next week if I do not hear back.  [name], PharmD, DPLA

## 2024-06-29 NOTE — Telephone Encounter (Signed)
 Called to confirm/remind patient of their appointment at [facility] on 06/30/24.   Appointment:   [x] Confirmed  [] Left mess   [] No answer/No voice mail  [] VM Full/unable to leave message  [] Phone not in service  Patient reminded to bring all medications and/or complete list.  Confirmed patient has transportation. Gave directions, instructed to utilize valet parking.

## 2024-06-30 ENCOUNTER — Encounter: Payer: Self-pay | Admitting: Family

## 2024-06-30 ENCOUNTER — Ambulatory Visit: Payer: Self-pay | Admitting: Family

## 2024-06-30 ENCOUNTER — Other Ambulatory Visit

## 2024-06-30 ENCOUNTER — Ambulatory Visit (HOSPITAL_BASED_OUTPATIENT_CLINIC_OR_DEPARTMENT_OTHER): Admitting: Family

## 2024-06-30 ENCOUNTER — Other Ambulatory Visit: Payer: Self-pay | Admitting: Family

## 2024-06-30 ENCOUNTER — Other Ambulatory Visit
Admission: RE | Admit: 2024-06-30 | Discharge: 2024-06-30 | Disposition: A | Discharge status: Home / Self Care | Source: Ambulatory Visit | Attending: Family | Admitting: Family

## 2024-06-30 VITALS — BP 128/49 | HR 83 | Wt 224.1 lb

## 2024-06-30 DIAGNOSIS — I493 Ventricular premature depolarization: Secondary | ICD-10-CM | POA: Diagnosis not present

## 2024-06-30 DIAGNOSIS — J410 Simple chronic bronchitis: Secondary | ICD-10-CM

## 2024-06-30 DIAGNOSIS — I25118 Atherosclerotic heart disease of native coronary artery with other forms of angina pectoris: Secondary | ICD-10-CM

## 2024-06-30 DIAGNOSIS — I5022 Chronic systolic (congestive) heart failure: Secondary | ICD-10-CM | POA: Diagnosis not present

## 2024-06-30 DIAGNOSIS — R7989 Other specified abnormal findings of blood chemistry: Secondary | ICD-10-CM

## 2024-06-30 LAB — BASIC METABOLIC PANEL WITH GFR
Anion gap: 14 (ref 5–15)
BUN: 13 mg/dL (ref 8–23)
CO2: 26 mmol/L (ref 22–32)
Calcium: 8.7 mg/dL — ABNORMAL LOW (ref 8.9–10.3)
Chloride: 99 mmol/L (ref 98–111)
Creatinine, Ser: 0.71 mg/dL (ref 0.44–1.00)
GFR, Estimated: >60 mL/min (ref >60)
Glucose, Bld: 312 mg/dL — ABNORMAL HIGH (ref 70–99)
Potassium: 4.7 mmol/L (ref 3.5–5.1)
Sodium: 139 mmol/L (ref 135–145)

## 2024-06-30 NOTE — Telephone Encounter (Signed)
 Requested medication (s) are due for refill today: yes  Requested medication (s) are on the active medication list: yes  Last refill:  08/09/23  Future visit scheduled: no  Notes to clinic:  Unable to refill per protocol, cannot delegate.      Requested Prescriptions  Pending Prescriptions Disp Refills   QUEtiapine  (SEROQUEL ) 200 MG tablet [Pharmacy Med Name: QUETIAPINE   200MG   TAB] 90 tablet 3    Sig: TAKE 1 TABLET BY MOUTH DAILY AT  BEDTIME     Not Delegated - Psychiatry:  Antipsychotics - Second Generation (Atypical) - quetiapine  Failed - 06/30/2024  9:08 AM      Failed - This refill cannot be delegated      Failed - Lipid Panel in normal range within the last 12 months    Cholesterol, Total  Date Value Ref Range Status  05/19/2024 81 (L) 100 - 199 mg/dL Final   LDL Cholesterol (Calc)  Date Value Ref Range Status  01/01/2018 29 mg/dL (calc) Final    Comment:    Reference range: <100 . Desirable range <100 mg/dL for primary prevention;   <70 mg/dL for patients with CHD or diabetic patients  with > or = 2 CHD risk factors. SABRA LDL-C is now calculated using the Martin-Hopkins  calculation, which is a validated novel method providing  better accuracy than the Friedewald equation in the  estimation of LDL-C.  Gladis APPLETHWAITE et al. SANDREA. 7986;689(80): 2061-2068  (http://education.[facility].com/faq/FAQ164)    LDL Chol Calc (NIH)  Date Value Ref Range Status  05/19/2024 25 0 - 99 mg/dL Final   HDL  Date Value Ref Range Status  05/19/2024 37 (L) >39 mg/dL Final   Triglycerides  Date Value Ref Range Status  05/19/2024 96 0 - 149 mg/dL Final         Passed - TSH in normal range and within 360 days    TSH  Date Value Ref Range Status  05/19/2024 3.080 0.450 - 4.500 uIU/mL Final         Passed - Last BP in normal range    BP Readings from Last 1 Encounters:  05/27/24 (!) 104/54         Passed - Last Heart Rate in normal range    Pulse Readings from Last 1  Encounters:  05/27/24 88         Passed - Valid encounter within last 6 months    Recent Outpatient Visits           1 month ago Hypertension associated with diabetes [facility])   [place] [facility] Melvin Pao, NP   4 months ago Annual physical exam   [place] [facility] Melvin Pao, NP       Future Appointments             In 6 days Gollan, Evalene PARAS, MD [place] [facility] - CBC within normal limits and completed in the last 12 months    WBC  Date Value Ref Range Status  02/12/2024 4.8 3.4 - 10.8 x10E3/uL Final  08/17/2023 3.9 (L) 4.0 - 10.5 K/uL Final   RBC  Date Value Ref Range Status  02/12/2024 4.41 3.77 - 5.28 x10E6/uL Final  08/17/2023 3.51 (L) 3.87 - 5.11 MIL/uL Final   Hemoglobin  Date Value Ref Range Status  02/12/2024 12.5 11.1 - 15.9 g/dL Final   Hematocrit  Date Value Ref Range  Status  02/12/2024 39.2 34.0 - 46.6 % Final   MCHC  Date Value Ref Range Status  02/12/2024 31.9 31.5 - 35.7 g/dL Final  89/73/7975 69.0 30.0 - 36.0 g/dL Final   [facility]  Date Value Ref Range Status  02/12/2024 28.3 26.6 - 33.0 pg Final  08/17/2023 27.1 26.0 - 34.0 pg Final   MCV  Date Value Ref Range Status  02/12/2024 89 79 - 97 fL Final  02/24/2013 95 80 - 100 fL Final   No results found for: PLTCOUNTKUC, LABPLAT, POCPLA RDW  Date Value Ref Range Status  02/12/2024 15.0 11.7 - 15.4 % Final  02/24/2013 13.0 11.5 - 14.5 % Final         Passed - CMP within normal limits and completed in the last 12 months    Albumin  Date Value Ref Range Status  05/19/2024 4.3 3.8 - 4.8 g/dL Final  94/93/7985 3.6 3.4 - 5.0 g/dL Final   Alkaline Phosphatase  Date Value Ref Range Status  05/19/2024 167 (H) 44 - 121 IU/L Final  02/24/2013 113 50 - 136 Unit/L Final   Alkaline phosphatase (APISO)  Date Value Ref Range Status  01/01/2018 92 33 - 130 U/L Final   ALT  Date Value Ref Range Status   05/19/2024 77 (H) 0 - 32 IU/L Final   SGPT (ALT)  Date Value Ref Range Status  02/24/2013 94 (H) 12 - 78 U/L Final   AST  Date Value Ref Range Status  05/19/2024 75 (H) 0 - 40 IU/L Final   SGOT(AST)  Date Value Ref Range Status  02/24/2013 58 (H) 15 - 37 Unit/L Final   BUN  Date Value Ref Range Status  05/26/2024 10 8 - 23 mg/dL Final  92/70/7974 19 8 - 27 mg/dL Final  94/93/7985 11 7 - 18 mg/dL Final   Calcium   Date Value Ref Range Status  05/26/2024 8.9 8.9 - 10.3 mg/dL Final   Calcium , Total  Date Value Ref Range Status  02/24/2013 8.6 8.5 - 10.1 mg/dL Final   CO2  Date Value Ref Range Status  05/26/2024 22 22 - 32 mmol/L Final   Co2  Date Value Ref Range Status  02/24/2013 26 21 - 32 mmol/L Final   Bicarbonate  Date Value Ref Range Status  01/06/2018 24.3 20.0 - 28.0 mmol/L Final   Creat  Date Value Ref Range Status  01/01/2018 0.82 0.50 - 0.99 mg/dL Final    Comment:    For patients >32 years of age, the reference limit for Creatinine is approximately 13% higher for people identified as African-American. .    Creatinine, Ser  Date Value Ref Range Status  05/26/2024 0.68 0.44 - 1.00 mg/dL Final   Glucose  Date Value Ref Range Status  02/24/2013 87 65 - 99 mg/dL Final   Glucose, Bld  Date Value Ref Range Status  05/26/2024 280 (H) 70 - 99 mg/dL Final    Comment:    Glucose reference range applies only to samples taken after fasting for at least 8 hours.   Glucose-Capillary  Date Value Ref Range Status  08/17/2023 129 (H) 70 - 99 mg/dL Final    Comment:    Glucose reference range applies only to samples taken after fasting for at least 8 hours.   Potassium  Date Value Ref Range Status  05/26/2024 4.1 3.5 - 5.1 mmol/L Final  02/24/2013 4.1 3.5 - 5.1 mmol/L Final   Sodium  Date Value Ref Range Status  05/26/2024  136 135 - 145 mmol/L Final  05/19/2024 137 134 - 144 mmol/L Final  02/24/2013 143 136 - 145 mmol/L Final   Bilirubin,Total   Date Value Ref Range Status  02/24/2013 0.4 0.2 - 1.0 mg/dL Final   Bilirubin Total  Date Value Ref Range Status  05/19/2024 0.6 0.0 - 1.2 mg/dL Final   Bilirubin, Direct  Date Value Ref Range Status  01/30/2022 0.4 (H) 0.0 - 0.2 mg/dL Final   Indirect Bilirubin  Date Value Ref Range Status  01/30/2022 0.6 0.3 - 0.9 mg/dL Final    Comment:    Performed at [facility], [address] [place], [place] [address]   Protein, ur  Date Value Ref Range Status  08/15/2023 NEGATIVE NEGATIVE mg/dL Final   Total Protein  Date Value Ref Range Status  05/19/2024 7.4 6.0 - 8.5 g/dL Final  94/93/7985 7.2 6.4 - 8.2 g/dL Final   GFR, Est African American  Date Value Ref Range Status  01/01/2018 88 > OR = 60 mL/min/1.94m2 Final   GFR calc Af Amer  Date Value Ref Range Status  10/17/2020 79 >59 mL/min/1.73 Final    Comment:    **In accordance with recommendations from the NKF-ASN Task force,**   [facility] is in the process of updating its eGFR calculation to the   2021 CKD-EPI creatinine equation that estimates kidney function   without a race variable.    eGFR  Date Value Ref Range Status  05/19/2024 93 >59 mL/min/1.73 Final   GFR, Est Non African American  Date Value Ref Range Status  01/01/2018 76 > OR = 60 mL/min/1.25m2 Final   GFR, Estimated  Date Value Ref Range Status  05/26/2024 >60 >60 mL/min Final    Comment:    (NOTE) Calculated using the CKD-EPI Creatinine Equation (2021)          Signed Prescriptions Disp Refills   metFORMIN  (GLUCOPHAGE ) 1000 MG tablet 200 tablet 0    Sig: TAKE 1 TABLET BY MOUTH TWICE  DAILY WITH MEALS     Endocrinology:  Diabetes - Biguanides Failed - 06/30/2024  9:08 AM      Failed - HBA1C is between 0 and 7.9 and within 180 days    Hemoglobin A1C  Date Value Ref Range Status  07/11/2023 8.0  Final    Comment:    care everywhere   Hgb A1c MFr Bld  Date Value Ref Range Status  05/19/2024 11.3 (H) 4.8 - 5.6 % Final     Comment:             Prediabetes: 5.7 - 6.4          Diabetes: >6.4          Glycemic control for adults with diabetes: <7.0    A1c  Date Value Ref Range Status  11/03/2023 7.1  Final    Comment:    ABSTRACTED BY HIM         Passed - Cr in normal range and within 360 days    Creat  Date Value Ref Range Status  01/01/2018 0.82 0.50 - 0.99 mg/dL Final    Comment:    For patients >106 years of age, the reference limit for Creatinine is approximately 13% higher for people identified as African-American. .    Creatinine, Ser  Date Value Ref Range Status  05/26/2024 0.68 0.44 - 1.00 mg/dL Final         Passed - eGFR in normal range and within 360 days  GFR, Est African American  Date Value Ref Range Status  01/01/2018 88 > OR = 60 mL/min/1.10m2 Final   GFR calc Af Amer  Date Value Ref Range Status  10/17/2020 79 >59 mL/min/1.73 Final    Comment:    **In accordance with recommendations from the NKF-ASN Task force,**   [facility] is in the process of updating its eGFR calculation to the   2021 CKD-EPI creatinine equation that estimates kidney function   without a race variable.    GFR, Est Non African American  Date Value Ref Range Status  01/01/2018 76 > OR = 60 mL/min/1.67m2 Final   GFR, Estimated  Date Value Ref Range Status  05/26/2024 >60 >60 mL/min Final    Comment:    (NOTE) Calculated using the CKD-EPI Creatinine Equation (2021)    eGFR  Date Value Ref Range Status  05/19/2024 93 >59 mL/min/1.73 Final         Passed - B12 Level in normal range and within 720 days    Vitamin B-12  Date Value Ref Range Status  11/16/2022 494 232 - 1,245 pg/mL Final         Passed - Valid encounter within last 6 months    Recent Outpatient Visits           1 month ago Hypertension associated with diabetes (HCC)   [place] [facility] Melvin Pao, NP   4 months ago Annual physical exam   [place] [facility] Melvin Pao,  NP       Future Appointments             In 6 days Gollan, Evalene PARAS, MD [place] [facility] at [facility] - CBC within normal limits and completed in the last 12 months    WBC  Date Value Ref Range Status  02/12/2024 4.8 3.4 - 10.8 x10E3/uL Final  08/17/2023 3.9 (L) 4.0 - 10.5 K/uL Final   RBC  Date Value Ref Range Status  02/12/2024 4.41 3.77 - 5.28 x10E6/uL Final  08/17/2023 3.51 (L) 3.87 - 5.11 MIL/uL Final   Hemoglobin  Date Value Ref Range Status  02/12/2024 12.5 11.1 - 15.9 g/dL Final   Hematocrit  Date Value Ref Range Status  02/12/2024 39.2 34.0 - 46.6 % Final   MCHC  Date Value Ref Range Status  02/12/2024 31.9 31.5 - 35.7 g/dL Final  89/73/7975 69.0 30.0 - 36.0 g/dL Final   [facility]  Date Value Ref Range Status  02/12/2024 28.3 26.6 - 33.0 pg Final  08/17/2023 27.1 26.0 - 34.0 pg Final   MCV  Date Value Ref Range Status  02/12/2024 89 79 - 97 fL Final  02/24/2013 95 80 - 100 fL Final   No results found for: PLTCOUNTKUC, LABPLAT, POCPLA RDW  Date Value Ref Range Status  02/12/2024 15.0 11.7 - 15.4 % Final  02/24/2013 13.0 11.5 - 14.5 % Final

## 2024-06-30 NOTE — Addendum Note (Signed)
 Addended by: Kenslee Achorn A on: 06/30/2024 03:32 PM   Modules accepted: Orders

## 2024-06-30 NOTE — Progress Notes (Signed)
 ReDS Vest / Clip - 06/30/24 0955       ReDS Vest / Clip   Station Marker B    Ruler Value 25    ReDS Value Range Moderate volume overload    ReDS Actual Value 37

## 2024-06-30 NOTE — Patient Instructions (Signed)
 Medication Changes:  Take Lasix  60 MG for 3 days.  Then, decrease back to 20 MG once daily   Lab Work:  Go over to the [facility]. Go pass the gift shop and have your blood work completed today and again on Friday.  We will only call you if the results are abnormal or if the provider would like to make medication changes.  No news is good news.   Follow-Up in: 1 week with Ellouise Class, FNP.   Thank you for choosing [facility].    At the [facility], you and your health needs are our priority. We have a designated team specialized in the treatment of Heart Failure. This Care Team includes your primary Heart Failure Specialized Cardiologist (physician), Advanced Practice Providers (APPs- Physician Assistants and Nurse Practitioners), and Pharmacist who all work together to provide you with the care you need, when you need it.   You may see any of the following providers on your designated Care Team at your next follow up:  Dr. Toribio Fuel Dr. Ezra Shuck Dr. Ria Commander Dr. Morene Brownie Ellouise Class, FNP Jaun Bash, RPH-CPP  Please be sure to bring in all your medications bottles to every appointment.   Need to Contact Us :  If you have any questions or concerns before your next appointment please send us  a message through [place] or call our office at [phone].    TO LEAVE A MESSAGE FOR THE NURSE SELECT OPTION 2, PLEASE LEAVE A MESSAGE INCLUDING: YOUR NAME DATE OF BIRTH CALL BACK NUMBER REASON FOR CALL**this is important as we prioritize the call backs  YOU WILL RECEIVE A CALL BACK THE SAME DAY AS LONG AS YOU CALL BEFORE 4:00 PM

## 2024-06-30 NOTE — Telephone Encounter (Signed)
 Requested Prescriptions  Pending Prescriptions Disp Refills   QUEtiapine  (SEROQUEL ) 200 MG tablet [Pharmacy Med Name: QUETIAPINE   200MG   TAB] 90 tablet 3    Sig: TAKE 1 TABLET BY MOUTH DAILY AT  BEDTIME     Not Delegated - Psychiatry:  Antipsychotics - Second Generation (Atypical) - quetiapine  Failed - 06/30/2024  9:08 AM      Failed - This refill cannot be delegated      Failed - Lipid Panel in normal range within the last 12 months    Cholesterol, Total  Date Value Ref Range Status  05/19/2024 81 (L) 100 - 199 mg/dL Final   LDL Cholesterol (Calc)  Date Value Ref Range Status  01/01/2018 29 mg/dL (calc) Final    Comment:    Reference range: <100 . Desirable range <100 mg/dL for primary prevention;   <70 mg/dL for patients with CHD or diabetic patients  with > or = 2 CHD risk factors. SABRA LDL-C is now calculated using the Martin-Hopkins  calculation, which is a validated novel method providing  better accuracy than the Friedewald equation in the  estimation of LDL-C.  Gladis APPLETHWAITE et al. SANDREA. 7986;689(80): 2061-2068  (http://education.[facility].com/faq/FAQ164)    LDL Chol Calc (NIH)  Date Value Ref Range Status  05/19/2024 25 0 - 99 mg/dL Final   HDL  Date Value Ref Range Status  05/19/2024 37 (L) >39 mg/dL Final   Triglycerides  Date Value Ref Range Status  05/19/2024 96 0 - 149 mg/dL Final         Passed - TSH in normal range and within 360 days    TSH  Date Value Ref Range Status  05/19/2024 3.080 0.450 - 4.500 uIU/mL Final         Passed - Last BP in normal range    BP Readings from Last 1 Encounters:  05/27/24 (!) 104/54         Passed - Last Heart Rate in normal range    Pulse Readings from Last 1 Encounters:  05/27/24 88         Passed - Valid encounter within last 6 months    Recent Outpatient Visits           1 month ago Hypertension associated with diabetes (HCC)   [place] [facility] Melvin Pao, NP   4 months ago  Annual physical exam   [place] [facility] Melvin Pao, NP       Future Appointments             In 6 days Gollan, Evalene PARAS, MD [facility] at [facility] - CBC within normal limits and completed in the last 12 months    WBC  Date Value Ref Range Status  02/12/2024 4.8 3.4 - 10.8 x10E3/uL Final  08/17/2023 3.9 (L) 4.0 - 10.5 K/uL Final   RBC  Date Value Ref Range Status  02/12/2024 4.41 3.77 - 5.28 x10E6/uL Final  08/17/2023 3.51 (L) 3.87 - 5.11 MIL/uL Final   Hemoglobin  Date Value Ref Range Status  02/12/2024 12.5 11.1 - 15.9 g/dL Final   Hematocrit  Date Value Ref Range Status  02/12/2024 39.2 34.0 - 46.6 % Final   MCHC  Date Value Ref Range Status  02/12/2024 31.9 31.5 - 35.7 g/dL Final  89/73/7975 69.0 30.0 - 36.0 g/dL Final   [facility]  Date Value Ref Range Status  02/12/2024 28.3 26.6 -  33.0 pg Final  08/17/2023 27.1 26.0 - 34.0 pg Final   MCV  Date Value Ref Range Status  02/12/2024 89 79 - 97 fL Final  02/24/2013 95 80 - 100 fL Final   No results found for: PLTCOUNTKUC, LABPLAT, POCPLA RDW  Date Value Ref Range Status  02/12/2024 15.0 11.7 - 15.4 % Final  02/24/2013 13.0 11.5 - 14.5 % Final         Passed - CMP within normal limits and completed in the last 12 months    Albumin  Date Value Ref Range Status  05/19/2024 4.3 3.8 - 4.8 g/dL Final  94/93/7985 3.6 3.4 - 5.0 g/dL Final   Alkaline Phosphatase  Date Value Ref Range Status  05/19/2024 167 (H) 44 - 121 IU/L Final  02/24/2013 113 50 - 136 Unit/L Final   Alkaline phosphatase (APISO)  Date Value Ref Range Status  01/01/2018 92 33 - 130 U/L Final   ALT  Date Value Ref Range Status  05/19/2024 77 (H) 0 - 32 IU/L Final   SGPT (ALT)  Date Value Ref Range Status  02/24/2013 94 (H) 12 - 78 U/L Final   AST  Date Value Ref Range Status  05/19/2024 75 (H) 0 - 40 IU/L Final   SGOT(AST)  Date Value Ref Range Status  02/24/2013 58  (H) 15 - 37 Unit/L Final   BUN  Date Value Ref Range Status  05/26/2024 10 8 - 23 mg/dL Final  92/70/7974 19 8 - 27 mg/dL Final  94/93/7985 11 7 - 18 mg/dL Final   Calcium   Date Value Ref Range Status  05/26/2024 8.9 8.9 - 10.3 mg/dL Final   Calcium , Total  Date Value Ref Range Status  02/24/2013 8.6 8.5 - 10.1 mg/dL Final   CO2  Date Value Ref Range Status  05/26/2024 22 22 - 32 mmol/L Final   Co2  Date Value Ref Range Status  02/24/2013 26 21 - 32 mmol/L Final   Bicarbonate  Date Value Ref Range Status  01/06/2018 24.3 20.0 - 28.0 mmol/L Final   Creat  Date Value Ref Range Status  01/01/2018 0.82 0.50 - 0.99 mg/dL Final    Comment:    For patients >65 years of age, the reference limit for Creatinine is approximately 13% higher for people identified as African-American. .    Creatinine, Ser  Date Value Ref Range Status  05/26/2024 0.68 0.44 - 1.00 mg/dL Final   Glucose  Date Value Ref Range Status  02/24/2013 87 65 - 99 mg/dL Final   Glucose, Bld  Date Value Ref Range Status  05/26/2024 280 (H) 70 - 99 mg/dL Final    Comment:    Glucose reference range applies only to samples taken after fasting for at least 8 hours.   Glucose-Capillary  Date Value Ref Range Status  08/17/2023 129 (H) 70 - 99 mg/dL Final    Comment:    Glucose reference range applies only to samples taken after fasting for at least 8 hours.   Potassium  Date Value Ref Range Status  05/26/2024 4.1 3.5 - 5.1 mmol/L Final  02/24/2013 4.1 3.5 - 5.1 mmol/L Final   Sodium  Date Value Ref Range Status  05/26/2024 136 135 - 145 mmol/L Final  05/19/2024 137 134 - 144 mmol/L Final  02/24/2013 143 136 - 145 mmol/L Final   Bilirubin,Total  Date Value Ref Range Status  02/24/2013 0.4 0.2 - 1.0 mg/dL Final   Bilirubin Total  Date Value Ref Range  Status  05/19/2024 0.6 0.0 - 1.2 mg/dL Final   Bilirubin, Direct  Date Value Ref Range Status  01/30/2022 0.4 (H) 0.0 - 0.2 mg/dL Final    Indirect Bilirubin  Date Value Ref Range Status  01/30/2022 0.6 0.3 - 0.9 mg/dL Final    Comment:    Performed at [facility], [address] [place], [place] [address]   Protein, ur  Date Value Ref Range Status  08/15/2023 NEGATIVE NEGATIVE mg/dL Final   Total Protein  Date Value Ref Range Status  05/19/2024 7.4 6.0 - 8.5 g/dL Final  94/93/7985 7.2 6.4 - 8.2 g/dL Final   GFR, Est African American  Date Value Ref Range Status  01/01/2018 88 > OR = 60 mL/min/1.43m2 Final   GFR calc Af Amer  Date Value Ref Range Status  10/17/2020 79 >59 mL/min/1.73 Final    Comment:    **In accordance with recommendations from the NKF-ASN Task force,**   [facility] is in the process of updating its eGFR calculation to the   2021 CKD-EPI creatinine equation that estimates kidney function   without a race variable.    eGFR  Date Value Ref Range Status  05/19/2024 93 >59 mL/min/1.73 Final   GFR, Est Non African American  Date Value Ref Range Status  01/01/2018 76 > OR = 60 mL/min/1.83m2 Final   GFR, Estimated  Date Value Ref Range Status  05/26/2024 >60 >60 mL/min Final    Comment:    (NOTE) Calculated using the CKD-EPI Creatinine Equation (2021)           metFORMIN  (GLUCOPHAGE ) 1000 MG tablet [Pharmacy Med Name: metFORMIN  HCl 1000 MG Oral Tablet] 200 tablet 0    Sig: TAKE 1 TABLET BY MOUTH TWICE  DAILY WITH MEALS     Endocrinology:  Diabetes - Biguanides Failed - 06/30/2024  9:08 AM      Failed - HBA1C is between 0 and 7.9 and within 180 days    Hemoglobin A1C  Date Value Ref Range Status  07/11/2023 8.0  Final    Comment:    care everywhere   Hgb A1c MFr Bld  Date Value Ref Range Status  05/19/2024 11.3 (H) 4.8 - 5.6 % Final    Comment:             Prediabetes: 5.7 - 6.4          Diabetes: >6.4          Glycemic control for adults with diabetes: <7.0    A1c  Date Value Ref Range Status  11/03/2023 7.1  Final    Comment:    ABSTRACTED BY HIM          Passed - Cr in normal range and within 360 days    Creat  Date Value Ref Range Status  01/01/2018 0.82 0.50 - 0.99 mg/dL Final    Comment:    For patients >35 years of age, the reference limit for Creatinine is approximately 13% higher for people identified as African-American. .    Creatinine, Ser  Date Value Ref Range Status  05/26/2024 0.68 0.44 - 1.00 mg/dL Final         Passed - eGFR in normal range and within 360 days    GFR, Est African American  Date Value Ref Range Status  01/01/2018 88 > OR = 60 mL/min/1.67m2 Final   GFR calc Af Amer  Date Value Ref Range Status  10/17/2020 79 >59 mL/min/1.73 Final    Comment:    **  In accordance with recommendations from the NKF-ASN Task force,**   [facility] is in the process of updating its eGFR calculation to the   2021 CKD-EPI creatinine equation that estimates kidney function   without a race variable.    GFR, Est Non African American  Date Value Ref Range Status  01/01/2018 76 > OR = 60 mL/min/1.16m2 Final   GFR, Estimated  Date Value Ref Range Status  05/26/2024 >60 >60 mL/min Final    Comment:    (NOTE) Calculated using the CKD-EPI Creatinine Equation (2021)    eGFR  Date Value Ref Range Status  05/19/2024 93 >59 mL/min/1.73 Final         Passed - B12 Level in normal range and within 720 days    Vitamin B-12  Date Value Ref Range Status  11/16/2022 494 232 - 1,245 pg/mL Final         Passed - Valid encounter within last 6 months    Recent Outpatient Visits           1 month ago Hypertension associated with diabetes (HCC)   [place] [facility] Melvin Pao, NP   4 months ago Annual physical exam   [place] [facility] Melvin Pao, NP       Future Appointments             In 6 days Gollan, Evalene PARAS, MD  HeartCare at [facility] - CBC within normal limits and completed in the last 12 months    WBC  Date Value Ref Range  Status  02/12/2024 4.8 3.4 - 10.8 x10E3/uL Final  08/17/2023 3.9 (L) 4.0 - 10.5 K/uL Final   RBC  Date Value Ref Range Status  02/12/2024 4.41 3.77 - 5.28 x10E6/uL Final  08/17/2023 3.51 (L) 3.87 - 5.11 MIL/uL Final   Hemoglobin  Date Value Ref Range Status  02/12/2024 12.5 11.1 - 15.9 g/dL Final   Hematocrit  Date Value Ref Range Status  02/12/2024 39.2 34.0 - 46.6 % Final   MCHC  Date Value Ref Range Status  02/12/2024 31.9 31.5 - 35.7 g/dL Final  89/73/7975 69.0 30.0 - 36.0 g/dL Final   [facility]  Date Value Ref Range Status  02/12/2024 28.3 26.6 - 33.0 pg Final  08/17/2023 27.1 26.0 - 34.0 pg Final   MCV  Date Value Ref Range Status  02/12/2024 89 79 - 97 fL Final  02/24/2013 95 80 - 100 fL Final   No results found for: PLTCOUNTKUC, LABPLAT, POCPLA RDW  Date Value Ref Range Status  02/12/2024 15.0 11.7 - 15.4 % Final  02/24/2013 13.0 11.5 - 14.5 % Final

## 2024-07-03 NOTE — Progress Notes (Deleted)
 Cardiology Office Note  Date:  07/03/2024   ID:  [name], DOB [birth year], MRN [id number]  PCP:  Melvin Pao, NP   No chief complaint on file.   HPI:  [name] is a 73 year old woman with past medical history of Asthma with exacerbation Morbid obesity Prior smoker Nonischemic cardiomyopathy Diabetes type 2 Who presents for follow-up of her shortness of breath  Echo in 11/20 showed EF 25-30%.  LHC in 11/20 and coronary CTA in 9/21 have shown nonobstructive CAD.  She has been evaluated by pulmonary, mild restrictive lung disease and mild COPD.  She quit smoking in 2020, she does not drink ETOH.   Echo in 3/22 showed EF 30-35%, with normal RV.   RHC in 5/22 was worrisome, showing right > left heart failure and low cardiac output with low PAPI.    Zio patch in 8/22 showed 21% PVCs.  Amiodarone  was started.  Cardiac MRI in 9/22 showed EF 40%, septal-lateral dyssynchrony, RV EF 45%, LGE in the mid-wall apical lateral and apical septal walls. Repeat Zio patch in 10/22 showed 5.8% PVCs, improved.     Patient stopped Farxiga  after 3 yeast infections.    Echo in 5/23 showed EF 35-40%, mildly decreased RV systolic function, trivial MR.  Echo in 6/24 showed EF up to 50-55%, normal RV, mild MR, IVC normal. PFTs showed mild obstruction from emphysema.    Patient was admitted in 10/24 with hypotension and AKI. Midodrine  was started and atorvastatin  (LFTs were elevated), Protonix , spironolactone , torsemide , and valsartan  were all stopped.  She was eventually titrated off midodrine .  Echo this admission showed EF 60-65% with normal RV size and systolic function.    Last seen by myself in clinic October 2020 Followed by heart failure clinic Last seen by our clinic March 2025  Reports having shortness of breath and fatigue SOB since weight been going up Feels her weight should be around 145 pounds, current weight 223 pounds No regular exercise, lots of sweet tea, high  carbohydrate foods  Echocardiogram has been ordered by Dr. Tamea Scheduled for next week  Discussed prior cardiac history  Stress test 2014, Dr. Fernand, in the computer No ischemia, low risk scan Ejection fraction 60%  Remote echo with Kim Buchanan, results unclear Lots of stress testing by Kim Buchanan, she is unaware of the results Was told by Kim Buchanan  does not look good, but she does not know the details.  Reports he sometimes does two stress test a year  Labs reviewed Borderline diabetes Total chol 90  EKG personally reviewed by myself on todays visit NSR rate 93 bpm, IVCD/incomplete left bundle branch block No significant change in EKG compared to 2014    PMH:   has a past medical history of Anxiety, Arthritis, COPD (chronic obstructive pulmonary disease) (HCC), Depression, Diabetes mellitus without complication (HCC), GERD (gastroesophageal reflux disease), Hyperlipidemia, Morbid obesity (HCC), NICM (nonischemic cardiomyopathy) (HCC), Nonobstructive CAD (coronary artery disease), Osteoporosis, Panic attack, PTSD (post-traumatic stress disorder), PVC's (premature ventricular contractions), Rectal fistula, Sleep apnea, and Wears dentures.  PSH:    Past Surgical History:  Procedure Laterality Date   CARDIAC CATHETERIZATION     COLONOSCOPY  2014   COLONOSCOPY WITH PROPOFOL  N/A 10/01/2016   Procedure: COLONOSCOPY WITH PROPOFOL ;  Surgeon: Kim Copping, MD;  Location: [facility];  Service: Endoscopy;  Laterality: N/A;   COLONOSCOPY WITH PROPOFOL  N/A 01/04/2022   Procedure: COLONOSCOPY WITH PROPOFOL ;  Surgeon: Buchanan Rogelia, MD;  Location: [facility] END[facility];  Service: Endoscopy;  Laterality:  N/A;   CORONARY PRESSURE/FFR WITH 3D MAPPING N/A 09/11/2019   Procedure: Coronary Pressure Wire/FFR w/3D Mapping;  Surgeon: Kim Bruckner, MD;  Location: [facility];  Service: Cardiovascular;  Laterality: N/A;   CYSTOCELE REPAIR N/A 12/17/2016   Procedure: ANTERIOR REPAIR (CYSTOCELE);   Surgeon: Kim DELENA Dollar, MD;  Location: [facility] ORS;  Service: Gynecology;  Laterality: N/A;   ESOPHAGOGASTRODUODENOSCOPY (EGD) WITH PROPOFOL  N/A 08/11/2019   Procedure: ESOPHAGOGASTRODUODENOSCOPY (EGD) WITH PROPOFOL ;  Surgeon: Kim Carmine, MD;  Location: [facility] END[facility];  Service: Endoscopy;  Laterality: N/A;   POLYPECTOMY  10/01/2016   Procedure: POLYPECTOMY;  Surgeon: Buchanan Jinny, MD;  Location: [facility];  Service: Endoscopy;;   RECTAL SURGERY  06/28/2015   Rectal prolapse, laparoscopic rectopexy the coldCharmont, MD; [facility]   RIGHT HEART CATH N/A 03/21/2021   Procedure: RIGHT HEART CATH;  Surgeon: Kim Bruckner, MD;  Location: [facility];  Service: Cardiovascular;  Laterality: N/A;   RIGHT/LEFT HEART CATH AND CORONARY ANGIOGRAPHY Bilateral 09/11/2019   Procedure: RIGHT/LEFT HEART CATH AND CORONARY ANGIOGRAPHY;  Surgeon: Kim Evalene PARAS, MD;  Location: [facility];  Service: Cardiovascular;  Laterality: Bilateral;   TOTAL ABDOMINAL HYSTERECTOMY W/ BILATERAL SALPINGOOPHORECTOMY     TUBAL LIGATION     VAGINAL HYSTERECTOMY Bilateral 12/17/2016   Procedure: HYSTERECTOMY VAGINAL WITH BILATERAL SALPINGO OOPHERECTOMY;  Surgeon: Kim DELENA Dollar, MD;  Location: [facility] OR[facility];  Service: Gynecology;  Laterality: Bilateral;    Current Outpatient Medications  Medication Sig Dispense Refill   amiodarone  (PACERONE ) 200 MG tablet Take 0.5 tablets (100 mg total) by mouth daily. 45 tablet 3   aspirin  EC 81 MG tablet Take 1 tablet (81 mg total) by mouth daily. Swallow whole. 30 tablet 11   Continuous Blood Gluc Receiver (FREESTYLE LIBRE 2 READER) DEVI 1 each by Does not apply route daily. 1 each 0   Continuous Glucose Sensor (FREESTYLE LIBRE 2 PLUS SENSOR) MISC Change sensor every 15 days. 6 each 4   cyclobenzaprine  (FLEXERIL ) 10 MG tablet Take 1 tablet (10 mg total) by mouth 3 (three) times daily as needed for muscle spasms. (Patient not taking: Reported on 06/30/2024) 90  tablet 1   furosemide  (LASIX ) 20 MG tablet Take 1 tablet (20 mg total) by mouth daily. 90 tablet 3   glimepiride  (AMARYL ) 4 MG tablet Take 4 mg by mouth daily with breakfast.     insulin  degludec (TRESIBA  FLEXTOUCH) 200 UNIT/ML FlexTouch Pen Reduce from 100 units to 70 units.     insulin  lispro (HUMALOG  KWIKPEN) 100 UNIT/ML KwikPen Inject 20 Units into the skin 3 (three) times daily. With meals. 60 mL 0   Insulin  Pen Needle 30G X 6 MM MISC 1 Application by Does not apply route daily. 100 each 2   Lancets (ONETOUCH DELICA PLUS LANCET30G) MISC TEST ONCE DAILY 100 each 0   levothyroxine  (SYNTHROID ) 88 MCG tablet Take 1 tablet (88 mcg total) by mouth daily. 90 tablet 1   metFORMIN  (GLUCOPHAGE ) 1000 MG tablet TAKE 1 TABLET BY MOUTH TWICE  DAILY WITH MEALS 200 tablet 0   metoprolol  succinate (TOPROL -XL) 25 MG 24 hr tablet TAKE 1 TABLET BY MOUTH DAILY (Patient taking differently: Take 12.5 mg by mouth daily. TAKE 1 TABLET BY MOUTH DAILY) 90 tablet 3   nitroGLYCERIN  (NITROSTAT ) 0.3 MG SL tablet Place 1 tablet (0.3 mg total) under the tongue every 5 (five) minutes as needed for chest pain. 100 tablet 3   ONETOUCH ULTRA test strip TEST ONCE DAILY 100 strip 3  pantoprazole  (PROTONIX ) 40 MG tablet Take 1 tablet (40 mg total) by mouth daily. 100 tablet 3   polyethylene glycol powder (GLYCOLAX /MIRALAX ) 17 GM/SCOOP powder Take 17 g by mouth daily.     QUEtiapine  (SEROQUEL ) 200 MG tablet TAKE 1 TABLET BY MOUTH DAILY AT  BEDTIME 90 tablet 3   spironolactone  (ALDACTONE ) 25 MG tablet Take 1 tablet (25 mg total) by mouth daily. 90 tablet 3   traMADol  (ULTRAM ) 50 MG tablet Take 1 tablet (50 mg total) by mouth every 8 (eight) hours as needed for severe pain (pain score 7-10). (Patient not taking: Reported on 06/30/2024) 30 tablet 0   valsartan  (DIOVAN ) 40 MG tablet Take 0.5 tablets (20 mg total) by mouth daily. 45 tablet 1   No current facility-administered medications for this visit.     Allergies:   Entresto   [sacubitril-valsartan ] and Lisinopril   Social History:  The patient  reports that she quit smoking about 5 years ago. Her smoking use included cigarettes. She started smoking about 55 years ago. She has a 75 pack-year smoking history. She has never used smokeless tobacco. She reports that she does not drink alcohol and does not use drugs.   Family History:   family history includes Cancer in her sister; Diabetes in her brother, brother, mother, sister, and sister; Healthy in her sister; Heart disease in her brother and father; Hypertension in her father; Lymphoma in her brother and brother.    Review of Systems: Review of Systems  Constitutional: Negative.        Weight gain  HENT: Negative.    Respiratory:  Positive for shortness of breath.   Cardiovascular: Negative.   Gastrointestinal: Negative.   Musculoskeletal: Negative.   Neurological: Negative.   Psychiatric/Behavioral: Negative.    All other systems reviewed and are negative.   PHYSICAL EXAM: VS:  There were no vitals taken for this visit. , BMI There is no height or weight on file to calculate BMI. GEN: Well nourished, well developed, in no acute distress HEENT: normal Neck: no JVD, carotid bruits, or masses Cardiac: RRR; no murmurs, rubs, or gallops,no edema  Respiratory:  clear to auscultation bilaterally, normal work of breathing GI: soft, nontender, nondistended, + BS MS: no deformity or atrophy Skin: warm and dry, no rash Neuro:  Strength and sensation are intact Psych: euthymic mood, full affect  Recent Labs: 08/17/2023: Magnesium 2.5 02/12/2024: Hemoglobin 12.5; Platelets 165 05/19/2024: ALT 77; TSH 3.080 05/26/2024: B Natriuretic Peptide 15.4 06/30/2024: BUN 13; Creatinine, Ser 0.71; Potassium 4.7; Sodium 139    Lipid Panel Lab Results  Component Value Date   CHOL 81 (L) 05/19/2024   HDL 37 (L) 05/19/2024   LDLCALC 25 05/19/2024   TRIG 96 05/19/2024      Wt Readings from Last 3 Encounters:  06/30/24  224 lb 2 oz (101.7 kg)  05/27/24 220 lb 3.2 oz (99.9 kg)  05/26/24 218 lb 8 oz (99.1 kg)     ASSESSMENT AND PLAN:  Problem List Items Addressed This Visit   None   Shortness of breath Remote smoking history, morbid obesity, deconditioning likely contributing Echocardiogram pending, ordered by pulmonary Reports having numerous stress test over the past several years These records have been requested Previous results reviewed with her, stress test from 2014 Clinical exam essentially benign apart from morbid obesity  Morbid obesity Discussed exercise program to the hospital, lung Works program She is not interested at this time Long discussion with her concerning need to increase her exercise, activities.  Discussed with her Also discussed diet She will cut out sweet tea is changed to water   Hypertension Blood pressure is well controlled on today's visit. No changes made to the medications.  Hyperlipidemia Cholesterol is at goal on the current lipid regimen. No changes to the medications were made.  Borderline diabetes Reports that this was new to her, all of the sugars going back 1 year have been above 100 Stressed importance of changing her soda and sweet tea to water  Following a low carbohydrate diet, starting a walking program  Addendum We did receive records after she had left the office Total cholesterol 69 Stress test performed April 02, 2019 with detail of ischemia in the LAD territory normal ejection fraction, EF 63% Cardiac CTA was recommended -Echocardiogram April 02, 2019 normal LV function, moderately dilated left atrium mild TR, mild PR, trace to mild AI, mild to moderate MR -----> scheduled to have echocardiogram next week in the hospital We will discuss the above findings with her as well as echocardiogram findings We can offer additional ischemic work-up if symptoms get worse High risk of breast attenuation artifact  Disposition:   F/U  12 months   Total  encounter time more than 60 minutes  Greater than 50% was spent in counseling and coordination of care with the patient    Signed, Velinda Lunger, M.D., Ph.D. [facility] [place], [place] [phone]

## 2024-07-05 NOTE — Progress Notes (Signed)
   07/06/2024  Patient ID: Kim Buchanan, female   DOB: Feb 25, 1951, 73 y.o.   MRN: [id number]  Subjective/Objective Telephone visit to follow-up on management of diabetes  Diabetes -Current medications:  Tresiba  80 units HS, Humalog  20 units TID with meals, metformin  1000mg  BID, glimepiride  4mg  with breakfast --Medications tried in the past: Ozempic- patient stopped taking after hospitalization in Oct of 2024 but is open to restarting this or Rybelsus -Diabetes is managed by [facility], but patient will not follow-up again until insurance changes next year (copay currently too expensive) -A1c of 11.3% 7/29 -Humalog  increased after 8/19 PCP visit and glimepiride  added on -Patient is using Libre 2+ for CGM -FBG 200-300 even with recent addition of glimepiride  and increased Humalog  dosing -Patient receives Tresiba  through Novo PAP, and this ships to endo at [place]   Assessment/Plan  Diabetes -Uncontrolled -I recommend initiating Rybelsus 3mg  daily x1 month, increasing to 7mg  daily thereafter.  I would increase total daily insulin  dose by 10% at least until Rybelsus is started.  If provider is in agreement, I will work with the medication assistance team to add Rybelsus to patient's Novo PAP enrollment  Follow-up:  will follow-up with patient to solidify plan and schedule a follow-up in another 3-4 weks  [name], PharmD, DPLA

## 2024-07-06 ENCOUNTER — Other Ambulatory Visit: Payer: Self-pay

## 2024-07-06 ENCOUNTER — Ambulatory Visit: Admitting: Cardiovascular Disease

## 2024-07-06 ENCOUNTER — Telehealth: Payer: Self-pay | Admitting: Family

## 2024-07-06 DIAGNOSIS — I5032 Chronic diastolic (congestive) heart failure: Secondary | ICD-10-CM

## 2024-07-06 DIAGNOSIS — I25118 Atherosclerotic heart disease of native coronary artery with other forms of angina pectoris: Secondary | ICD-10-CM

## 2024-07-06 DIAGNOSIS — I420 Dilated cardiomyopathy: Secondary | ICD-10-CM

## 2024-07-06 DIAGNOSIS — E1169 Type 2 diabetes mellitus with other specified complication: Secondary | ICD-10-CM

## 2024-07-06 DIAGNOSIS — I10 Essential (primary) hypertension: Secondary | ICD-10-CM

## 2024-07-06 DIAGNOSIS — I2510 Atherosclerotic heart disease of native coronary artery without angina pectoris: Secondary | ICD-10-CM

## 2024-07-06 DIAGNOSIS — E1142 Type 2 diabetes mellitus with diabetic polyneuropathy: Secondary | ICD-10-CM

## 2024-07-06 DIAGNOSIS — I700 Atherosclerosis of aorta: Secondary | ICD-10-CM

## 2024-07-06 DIAGNOSIS — I5022 Chronic systolic (congestive) heart failure: Secondary | ICD-10-CM

## 2024-07-06 DIAGNOSIS — E1159 Type 2 diabetes mellitus with other circulatory complications: Secondary | ICD-10-CM

## 2024-07-06 DIAGNOSIS — I493 Ventricular premature depolarization: Secondary | ICD-10-CM

## 2024-07-06 NOTE — Telephone Encounter (Signed)
 Called to confirm/remind patient of their appointment at the [facility] on 07/07/24.   Appointment:   [x] Confirmed  [] Left mess   [] No answer/No voice mail  [] VM Full/unable to leave message  [] Phone not in service  Patient reminded to bring all medications and/or complete list.  Confirmed patient has transportation. Gave directions, instructed to utilize valet parking.

## 2024-07-07 ENCOUNTER — Telehealth: Payer: Self-pay

## 2024-07-07 ENCOUNTER — Ambulatory Visit (HOSPITAL_BASED_OUTPATIENT_CLINIC_OR_DEPARTMENT_OTHER): Admitting: Family

## 2024-07-07 ENCOUNTER — Other Ambulatory Visit
Admission: RE | Admit: 2024-07-07 | Discharge: 2024-07-07 | Disposition: A | Discharge status: Home / Self Care | Source: Ambulatory Visit | Attending: Family | Admitting: Family

## 2024-07-07 ENCOUNTER — Ambulatory Visit: Payer: Self-pay | Admitting: Family

## 2024-07-07 ENCOUNTER — Encounter: Payer: Self-pay | Admitting: Family

## 2024-07-07 VITALS — BP 128/67 | HR 96 | Wt 222.8 lb

## 2024-07-07 DIAGNOSIS — I25118 Atherosclerotic heart disease of native coronary artery with other forms of angina pectoris: Secondary | ICD-10-CM

## 2024-07-07 DIAGNOSIS — Z794 Long term (current) use of insulin: Secondary | ICD-10-CM | POA: Insufficient documentation

## 2024-07-07 DIAGNOSIS — Z7982 Long term (current) use of aspirin: Secondary | ICD-10-CM | POA: Insufficient documentation

## 2024-07-07 DIAGNOSIS — E1142 Type 2 diabetes mellitus with diabetic polyneuropathy: Secondary | ICD-10-CM

## 2024-07-07 DIAGNOSIS — Z79899 Other long term (current) drug therapy: Secondary | ICD-10-CM | POA: Insufficient documentation

## 2024-07-07 DIAGNOSIS — J449 Chronic obstructive pulmonary disease, unspecified: Secondary | ICD-10-CM | POA: Insufficient documentation

## 2024-07-07 DIAGNOSIS — I5022 Chronic systolic (congestive) heart failure: Secondary | ICD-10-CM

## 2024-07-07 DIAGNOSIS — I493 Ventricular premature depolarization: Secondary | ICD-10-CM | POA: Insufficient documentation

## 2024-07-07 DIAGNOSIS — I428 Other cardiomyopathies: Secondary | ICD-10-CM | POA: Insufficient documentation

## 2024-07-07 DIAGNOSIS — Z87891 Personal history of nicotine dependence: Secondary | ICD-10-CM | POA: Diagnosis not present

## 2024-07-07 DIAGNOSIS — R7989 Other specified abnormal findings of blood chemistry: Secondary | ICD-10-CM

## 2024-07-07 DIAGNOSIS — Z7984 Long term (current) use of oral hypoglycemic drugs: Secondary | ICD-10-CM | POA: Diagnosis not present

## 2024-07-07 DIAGNOSIS — J410 Simple chronic bronchitis: Secondary | ICD-10-CM

## 2024-07-07 DIAGNOSIS — R0602 Shortness of breath: Secondary | ICD-10-CM | POA: Diagnosis present

## 2024-07-07 DIAGNOSIS — E119 Type 2 diabetes mellitus without complications: Secondary | ICD-10-CM | POA: Diagnosis not present

## 2024-07-07 LAB — BASIC METABOLIC PANEL WITH GFR
Anion gap: 13 (ref 5–15)
BUN: 14 mg/dL (ref 8–23)
CO2: 25 mmol/L (ref 22–32)
Calcium: 9.4 mg/dL (ref 8.9–10.3)
Chloride: 98 mmol/L (ref 98–111)
Creatinine, Ser: 0.59 mg/dL (ref 0.44–1.00)
GFR, Estimated: >60 mL/min (ref >60)
Glucose, Bld: 347 mg/dL — ABNORMAL HIGH (ref 70–99)
Potassium: 4.6 mmol/L (ref 3.5–5.1)
Sodium: 136 mmol/L (ref 135–145)

## 2024-07-07 MED ORDER — FUROSEMIDE 40 MG PO TABS: 40.0000 mg | ORAL_TABLET | Freq: Every day | ORAL | 3 refills | Status: DC

## 2024-07-07 NOTE — Patient Instructions (Signed)
 Medication Changes:  Increase Lasix  to 40 MG once daily   Lab Work:  Go over to the [facility]. Go pass the gift shop and have your blood work completed.  We will only call you if the results are abnormal or if the provider would like to make medication changes.  No news is good news.   Follow-Up in: 2 weeks with Ellouise Class, FNP.   Thank you for choosing [facility].    At the [facility], you and your health needs are our priority. We have a designated team specialized in the treatment of Heart Failure. This Care Team includes your primary Heart Failure Specialized Cardiologist (physician), Advanced Practice Providers (APPs- Physician Assistants and Nurse Practitioners), and Pharmacist who all work together to provide you with the care you need, when you need it.   You may see any of the following providers on your designated Care Team at your next follow up:  Dr. Toribio Fuel Dr. Ezra Shuck Dr. Ria Commander Dr. Morene Brownie Ellouise Class, FNP Jaun Bash, RPH-CPP  Please be sure to bring in all your medications bottles to every appointment.   Need to Contact Us :  If you have any questions or concerns before your next appointment please send us  a message through [place] or call our office at [phone].    TO LEAVE A MESSAGE FOR THE NURSE SELECT OPTION 2, PLEASE LEAVE A MESSAGE INCLUDING: YOUR NAME DATE OF BIRTH CALL BACK NUMBER REASON FOR CALL**this is important as we prioritize the call backs  YOU WILL RECEIVE A CALL BACK THE SAME DAY AS LONG AS YOU CALL BEFORE 4:00 PM

## 2024-07-07 NOTE — Progress Notes (Signed)
 ReDS Vest / Clip - 07/07/24 0800       ReDS Vest / Clip   Station Marker B    Ruler Value 29    ReDS Value Range Low volume    ReDS Actual Value 35

## 2024-07-07 NOTE — Progress Notes (Signed)
 Advanced Heart Failure Clinic Note     PCP: Melvin Pao, NP Cardiology: Dr. Perla HF Cardiology: Dr. Rolan  Chief complaint: shortness of breath   HPI:   Ms Mangen is a 73 y.o. female with a history of chronic systolic CHF/nonischemic cardiomyopathy, type 2 diabetes, and prior smoking was referred by Dr. Cindie for evaluation of CHF.  She has been noted to have a cardiomyopathy since 2020.  Echo in 11/20 showed EF 25-30%.  LHC in 11/20 and coronary CTA in 9/21 have shown nonobstructive CAD.  She has been evaluated by pulmonary, mild restrictive lung disease and mild COPD.  She quit smoking in 2020, she does not drink ETOH.  Echo in 3/22 showed EF 30-35%, with normal RV.  RHC in 5/22 was worrisome, showing right > left heart failure and low cardiac output with low PAPI.   Zio patch in 8/22 showed 21% PVCs.  Amiodarone  was started.  Cardiac MRI in 9/22 showed EF 40%, septal-lateral dyssynchrony, RV EF 45%, LGE in the mid-wall apical lateral and apical septal walls. Repeat Zio patch in 10/22 showed 5.8% PVCs, improved.    Patient stopped Farxiga  after 3 yeast infections.   Echo in 5/23 showed EF 35-40%, mildly decreased RV systolic function, trivial MR.  Echo in 6/24 showed EF up to 50-55%, normal RV, mild MR, IVC normal. PFTs showed mild obstruction from emphysema.   Patient was admitted in 10/24 with hypotension and AKI. Midodrine  was started and atorvastatin  (LFTs were elevated), Protonix , spironolactone , torsemide , and valsartan  were all stopped.  She was eventually titrated off midodrine .  Echo this admission showed EF 60-65% with normal RV size and systolic function.   Seen in [facility] 07/25 where ReDs reading was elevated. Lasix  40mg  was given for 3 days with reduction to 20mg . Valsartan  was started.   Seen in [facility] 06/30/24 where she was fluid up. Lasix  increased to 60mg  daily X 3 days and then reduced back to 20mg  daily.  She presents today, with her husband, for a HF follow-up  visit with shortness of breath (improving). Has associated fatigue. Since last here, she took extra diuretic X 3 days and says that she peed all the time. Says that she feels a little better since she was here last week.  Recent pulmonology, no abnormal findings   Labs (6/22): K 4.4, creatinine 0.79 Labs (2/23): K 4.8, creatinine 0.95, LDL 18, TSH 5.37, ALT 102, AST 95, hgb 15.2 Labs (8/23): LDL 74, K 4.3, creatinine 9.07, AST 33, ALT 46 Labs (2/24): LDL 19, TGs 173 Labs (3/24): K 4.9, creatinine 0.99, AST 127, ALT 132 Labs (5/24): K 4.7, creatinine 1.07, AST 147, ALT 73 Labs (6/24): TSH 9.58, LDL 26 Labs (11/24): K 4.4, creatinine 0.85, AST 83, ALT 99 Labs (12/24): TSH mildly elevated Labs (4/25): LDL 27, AST 84, ALT 82, alkaline phosphatase 212, TSH normal Labs (07/25): K 4.1/ 4.2, creatinine 0.54/ 0.68, LDL 25, A1c 11.3%, TSH normal Labs (08/25): K 4.1, creatinine 0.68, BNP 15.4 Labs (09/25): K 4.7, creatinine 0.71  ECG: not done  PMH: 1. Chronic systolic CHF: Nonischemic cardiomyopathy.   - Echo (6/20): Normal EF - Cardiolite (6/20): Anterior ischemia.  - Echo (11/20): EF 25-30% - LHC (11/20): 70% pRCA stenosis, FFR negative.  - Echo (8/21): EF 30-35% - Coronary CTA (9/21): Mild RCA disease, 40-59% mLAD stenosis but CT FFR negative.  - Echo (3/22): EF 30-35% with normal RV - RHC (5/22): mean RA 17, mean PA 28, mean PCWP 20, CI 1.3, PAPI <  1.  - Cardiac MRI (9/22): EF 40%, septal-lateral dyssynchrony, RV EF 45%, LGE in the mid-wall apical lateral and apical septal walls. - Echo (5/23): EF 35-40%, mildly decreased RV systolic function, trivial MR.   - Echo (6/24): EF up to 50-55%, normal RV, mild MR, IVC normal - Echo (10/24): EF 60-65% with normal RV size and systolic function.  2. COPD: Mild.  Quit smoking in 2020.  - PFTs with mild obstruction consistent with emphysema.  3. Elevated LFTs: ?NAFLD - Abdominal US  (2/24): Fatty liver.  - HCV negative 4. Type 2 diabetes 5.  Coronary artery disease: Nonobstructive.  6. Hyperlipidemia 7. OSA 8. GERD 9. Depression 10. PVCs: Zio patch in 8/22 showed 21% PVCs.  Amiodarone  was started. Repeat Zio patch in 10/22 showed 5.8% PVCs, improved.   11. Sleep study (8/22): No OSA.   FH: Brother with heart surgery, ?CHF.  SH: Married, no ETOH, quit smoking in 2020, retired, lives in [place]  ROS: All systems reviewed and negative except as per HPI.   Current Outpatient Medications  Medication Sig Dispense Refill   amiodarone  (PACERONE ) 200 MG tablet Take 0.5 tablets (100 mg total) by mouth daily. 45 tablet 3   aspirin  EC 81 MG tablet Take 1 tablet (81 mg total) by mouth daily. Swallow whole. 30 tablet 11   Continuous Blood Gluc Receiver (FREESTYLE LIBRE 2 READER) DEVI 1 each by Does not apply route daily. 1 each 0   Continuous Glucose Sensor (FREESTYLE LIBRE 2 PLUS SENSOR) MISC Change sensor every 15 days. 6 each 4   cyclobenzaprine  (FLEXERIL ) 10 MG tablet Take 1 tablet (10 mg total) by mouth 3 (three) times daily as needed for muscle spasms. (Patient not taking: Reported on 06/30/2024) 90 tablet 1   furosemide  (LASIX ) 20 MG tablet Take 1 tablet (20 mg total) by mouth daily. 90 tablet 3   glimepiride  (AMARYL ) 4 MG tablet Take 4 mg by mouth daily with breakfast.     insulin  degludec (TRESIBA  FLEXTOUCH) 200 UNIT/ML FlexTouch Pen Reduce from 100 units to 70 units. (Patient taking differently: Inject 80 Units into the skin daily. Reduce from 100 units to 70 units.)     insulin  lispro (HUMALOG  KWIKPEN) 100 UNIT/ML KwikPen Inject 20 Units into the skin 3 (three) times daily. With meals. 60 mL 0   Insulin  Pen Needle 30G X 6 MM MISC 1 Application by Does not apply route daily. 100 each 2   Lancets (ONETOUCH DELICA PLUS LANCET30G) MISC TEST ONCE DAILY 100 each 0   levothyroxine  (SYNTHROID ) 88 MCG tablet Take 1 tablet (88 mcg total) by mouth daily. 90 tablet 1   metFORMIN  (GLUCOPHAGE ) 1000 MG tablet TAKE 1 TABLET BY MOUTH TWICE  DAILY  WITH MEALS 200 tablet 0   metoprolol  succinate (TOPROL -XL) 25 MG 24 hr tablet TAKE 1 TABLET BY MOUTH DAILY (Patient taking differently: Take 12.5 mg by mouth daily. TAKE 1 TABLET BY MOUTH DAILY) 90 tablet 3   nitroGLYCERIN  (NITROSTAT ) 0.3 MG SL tablet Place 1 tablet (0.3 mg total) under the tongue every 5 (five) minutes as needed for chest pain. 100 tablet 3   ONETOUCH ULTRA test strip TEST ONCE DAILY 100 strip 3   pantoprazole  (PROTONIX ) 40 MG tablet Take 1 tablet (40 mg total) by mouth daily. 100 tablet 3   polyethylene glycol powder (GLYCOLAX /MIRALAX ) 17 GM/SCOOP powder Take 17 g by mouth daily.     QUEtiapine  (SEROQUEL ) 200 MG tablet TAKE 1 TABLET BY MOUTH DAILY AT  BEDTIME 90 tablet  3   spironolactone  (ALDACTONE ) 25 MG tablet Take 1 tablet (25 mg total) by mouth daily. 90 tablet 3   traMADol  (ULTRAM ) 50 MG tablet Take 1 tablet (50 mg total) by mouth every 8 (eight) hours as needed for severe pain (pain score 7-10). (Patient not taking: Reported on 06/30/2024) 30 tablet 0   valsartan  (DIOVAN ) 40 MG tablet Take 0.5 tablets (20 mg total) by mouth daily. 45 tablet 1   No current facility-administered medications for this visit.   Vitals:   07/07/24 0852  BP: 128/67  Pulse: 96  SpO2: 96%  Weight: 222 lb 12.8 oz (101.1 kg)   Wt Readings from Last 3 Encounters:  07/07/24 222 lb 12.8 oz (101.1 kg)  06/30/24 224 lb 2 oz (101.7 kg)  05/27/24 220 lb 3.2 oz (99.9 kg)   Lab Results  Component Value Date   CREATININE 0.71 06/30/2024   CREATININE 0.68 05/26/2024   CREATININE 0.68 05/19/2024    Physical Exam:   General: Well appearing.  Cor: No JVD. Regular rhythm, rate.  Lungs: clear Abdomen: soft, nontender, mildly distended. Extremities: no edema Neuro:. Affect pleasant, anxious  ReDs reading: 35 %, abnormal (see plan below)   Assessment/Plan:  1. Chronic systolic CHF: Nonischemic cardiomyopathy.  Echo in 3/22 with EF 30-35%, normal RV.  LHC in 11/20 and coronary CTA in 9/21  showed nonobstructive CAD.  RHC in 5/22 was worrisome with R>L heart failure and low cardiac output as well as low PAPI.  Cardiac MRI in 9/22 showed EF 40%, septal-lateral dyssynchrony, RV EF 45%, LGE in the mid-wall apical lateral and apical septal walls.  This was concerning for possible myocarditis (less likely amyloidosis or sarcoidosis).  Echo in 5/23 with EF 35-40%, mildly decreased RV systolic function, trivial MR.  Also concern that PVCs may play a role as Zio monitor in 8/22 showed 21% PVCs, improved to 5.8% with amiodarone .  Echo in 6/24 showed EF up to 50-55%, normal RV, mild MR, IVC normal.  Improvement in EF may be related to suppression of PVCs. Echo in 10/24 showed normal EF 60-65% with normal RV.   - NYHA class III - ReDs today is 35%; last week it was 37% - weight down 2 pounds from last visit here 1 week ago - Updated echo scheduled for 10/25 - Increase Lasix  to 40mg  daily as 20mg  daily doesn't seem to be enough anymore. BMET today. Repeat in 2 weeks - Continue valsartan  20 mg daily. Cough w/ entresto  - Continue spironolactone  25 daily.  - Off dapagliflozin  with multiple yeast infections.  - Continue Toprol  XL to 25 mg daily.  - BNP 05/14/24 was 70.9.  - BMET 06/30/24 reviewed: sodium 139, potassium 4.7, creatinine 0.71 and GFR >60  2. CAD: nonobstructive by LHC in 11/20 and coronary CTA in 9/21.  - Off atorvastatin  with elevated LFTs.  Last LDL in 07/25 was 25.  - Continue ASA 81 daily.   3. PVCs: 21% PVCs on 8/22 Zio monitor.  ?Playing role in cardiomyopathy. PVCs down to 5.8% on Zio in 10/22 on amiodarone .  This may have led to improvement in EF by echo in 6/24 and in 10/24.  - Continue amiodarone  100 mg daily.  On Levoxyl  for hypothyroidism managed by PCP.  Also has chronic mild elevation in LFTs.  4. Elevated LFTs: This appears to be chronic and mild.  LFT elevation pre-dates amiodarone  use, and amiodarone  does not seem to have worsened LFTs.  Suspect NAFLD.  She had workup  for  viral hepatitis (negative) in the past.   - Statin was stopped. Amiodarone  at lowest dose (100 mg daily).  - LFT's checked 07/25 and still elevated. GI referral to [facility] placed at last visit and she is awaiting an appointment.  - As long as there is not a significant rise, continue amiodarone  at low dose. Could consider changing to mexiletine.  - RUQ US  05/21/24:  Diffusely echogenic hepatic parenchyma consistent with fatty liver infiltration  - drank alcohol as teenager/ young adult but says that it wasn't excessive  5. COPD: Prior smoker.  I suspect COPD plays a role in her dyspnea. PFTs with mild obstruction suggesting a degree of emphysema.  - Follow with pulmonary.    Return in 2 weeks, sooner if needed.    Ellouise DELENA Class, FNP 07/07/24

## 2024-07-07 NOTE — Progress Notes (Signed)
   07/07/2024  Patient ID: [name], female   DOB: Nov 29, 1950, 73 y.o.   MRN: [id number]  PCP in agreement with initiating Rybelsus 3mg  daily x30 days, then increasing to 7mg  daily.  I will coordinate with the medication assistance team to have this added to patient's current Novo PAP enrollment.  Currently Tresiba  and pen needles are received through [employer] and shipped to endocrinology at [facility]; but patient would like Tresiba , pen needles, and Rybelsus sent to [facility].  Humalog  comes from [facility] and is shipped directly to patient's home.  Provider also in agreement with increasing TDD dose of insulin  by 10%- Tresiba  to 88 units at bedtime, Humalog  to 22 units TID with meals based on current FBG readings of 200+ and BG throughout the day of 300+.  Patient is wearing Libre 2+ sensors for CGM.  She has been advised on insulin  increases, and a telephone follow-up is scheduled in 2 weeks; but she will call me if BG's fall below 100 prior to that visit.  [name], PharmD, DPLA

## 2024-07-08 ENCOUNTER — Other Ambulatory Visit: Payer: Self-pay

## 2024-07-08 ENCOUNTER — Telehealth: Payer: Self-pay

## 2024-07-08 NOTE — Patient Outreach (Signed)
 Complex Care Management   Visit Note  07/08/2024  Name:  Kim Buchanan MRN: [id number] DOB: 1951-02-02  Situation: Referral received for Complex Care Management related to Multiple specialists.  I obtained verbal consent from Patient.  Visit completed with Patient  on the phone  Background:   Past Medical History:  Diagnosis Date   Anxiety    Arthritis    lower back   COPD (chronic obstructive pulmonary disease) (HCC)    Depression    Diabetes mellitus without complication (HCC)    GERD (gastroesophageal reflux disease)    Hyperlipidemia    Morbid obesity (HCC)    NICM (nonischemic cardiomyopathy) (HCC)    a. 03/2019 Echo: Nl LVEF; b. 09/04/2019 Echo: EF 25-30%; c. 09/11/2019 Cath: nonobs LAD/RCA dzs. EF 50-55%; c. 12/2020 Echo: EF 30-35%, glob HK. Mild LVH. Nl RVSP. Mild MR; d. 02/2021 RHC: Elev RH pressures. CO/CI 2.8/1.3; e. 06/2021 cMRI: EF 40%, marked sept-lat dyssynch & septal HK. Small, discrete areas of mid-wall LGE in apical lateral and apical septal walls - ? h/o myocarditis.   Nonobstructive CAD (coronary artery disease)    a. 03/2019 [facility]): EF 63%, ant ischemia; b. 08/2019 Cath: LM nl, LAD 7m, RCA 70p (iFR nl @ 0.97), 50p. EF 50-55%-->Med Rx; c. 06/2020 Cor CTA: Nl FFRs throughout cor tree.   Osteoporosis    Panic attack    PTSD (post-traumatic stress disorder)    PVC's (premature ventricular contractions)    a. 05/2021 Zio: 20.6% PVC burden. 13.4 secs of NSVT-->amio added; b. 06/2021 Zio: 5.8% PVC burden. Rare supraventrciular ectopy.   Rectal fistula    Sleep apnea    a. Prev wore CPAP but lost insurance and hasn't been using since (now has insurance).   Wears dentures    full upper    Assessment: Patient Reported Symptoms:  Cognitive Cognitive Status: Alert and oriented to person, place, and time, Normal speech and language skills      Neurological Neurological Review of Symptoms: No symptoms reported    HEENT HEENT Symptoms Reported: No symptoms reported       Cardiovascular Cardiovascular Symptoms Reported: No symptoms reported    Respiratory Respiratory Symptoms Reported: No symptoms reported Additional Respiratory Details: shortness of breath is at baseline.    Endocrine Endocrine Symptoms Reported: Hyperglycemia Is patient diabetic?: Yes Is patient checking blood sugars at home?: Yes List most recent blood sugar readings, include date and time of day: Reports this AM's FBG: 205mg /dL Endocrine Self-Management Outcome: 3 (uncertain) Endocrine Comment: States she is taking insulin  as prescribed.  She is working with pharmacist to adjust medications for improved coverage, next call with RPH is 07/21/24  Gastrointestinal Gastrointestinal Symptoms Reported: No symptoms reported      Genitourinary Genitourinary Symptoms Reported: No symptoms reported    Integumentary Integumentary Symptoms Reported: No symptoms reported    Musculoskeletal Musculoskelatal Symptoms Reviewed: No symptoms reported   Falls in the past year?: No    Psychosocial Psychosocial Symptoms Reported: No symptoms reported          07/08/2024    PHQ2-9 Depression Screening   Little interest or pleasure in doing things    Feeling down, depressed, or hopeless    PHQ-2 - Total Score    Trouble falling or staying asleep, or sleeping too much    Feeling tired or having little energy    Poor appetite or overeating     Feeling bad about yourself - or that you are a failure or have let  yourself or your family down    Trouble concentrating on things, such as reading the newspaper or watching television    Moving or speaking so slowly that other people could have noticed.  Or the opposite - being so fidgety or restless that you have been moving around a lot more than usual    Thoughts that you would be better off dead, or hurting yourself in some way    PHQ2-9 Total Score    If you checked off any problems, how difficult have these problems made it for you to do your work,  take care of things at home, or get along with other people    Depression Interventions/Treatment      There were no vitals filed for this visit.  Medications Reviewed Today   Medications were not reviewed in this encounter     Recommendation:   Discussed upcoming appts:  PCP 08/21/24 Channing Mealing, [facility] 07/21/24 Continue Current Plan of Care Await call from [facility] for new patient appt.   Follow Up Plan:   Telephone follow-up 07/29/24 at 10:30am.  Santana Stamp BSN, CCM [place]  [facility] RN Care Manager Direct Dial : [phone]  Fax: [phone]

## 2024-07-08 NOTE — Patient Instructions (Signed)
 Visit Information  Thank you for taking time to visit with me today. Please don't hesitate to contact me if I can be of assistance to you before our next scheduled appointment.  Your next care management appointment is by telephone on Wednesday, October 8th at 10:30am.    Please call the care guide team at [phone] if you need to cancel, schedule, or reschedule an appointment.   A reminder to ALL patients/family/friends, please call the [place]  National Suicide Prevention Lifeline: 501-407-6910 or TTY: (909)369-9924 TTY 226-619-9556) to talk to a trained counselor if you are experiencing a Mental Health or Behavioral Health Crisis or need someone to talk to.  Santana Stamp BSN, CCM [place]  [facility] RN Care Manager Direct Dial : [phone]  Fax: [phone]

## 2024-07-08 NOTE — Telephone Encounter (Signed)
 Received provider portion refill reorder form of Novo Nordisk (Rybelsus and Needles),faxed it to [employer] today.

## 2024-07-14 NOTE — Telephone Encounter (Signed)
 Received approval letter from [employer] on Rybelsus 3/7 thru 10/21/2024.

## 2024-07-16 ENCOUNTER — Telehealth: Payer: Self-pay

## 2024-07-16 NOTE — Telephone Encounter (Signed)
 Called patient to inform that their shipment through PAP (Patient Assistance Program) has been received in office and is available for pickup Monday-Friday 8am - 5pm (lunch 12:15pm-12:45pm office closed).  Patient aware to receive they will need to sign and date the pick up paperwork at the front desk folder.   Company: [employer] Medication received: Tresiba  How many units: 6 boxes

## 2024-07-20 ENCOUNTER — Telehealth: Payer: Self-pay | Admitting: Family

## 2024-07-20 NOTE — Progress Notes (Unsigned)
 Advanced Heart Failure Clinic Note     PCP: Melvin Pao, NP Cardiology: Dr. Perla HF Cardiology: Dr. Rolan  Chief complaint: shortness of breath   HPI:   Kim Buchanan is a 73 y.o. female with a history of chronic systolic CHF/nonischemic cardiomyopathy, type 2 diabetes, and prior smoking was referred by Dr. Cindie for evaluation of CHF.  She has been noted to have a cardiomyopathy since 2020.  Echo in 11/20 showed EF 25-30%.  LHC in 11/20 and coronary CTA in 9/21 have shown nonobstructive CAD.  She has been evaluated by pulmonary, mild restrictive lung disease and mild COPD.  She quit smoking in 2020, she does not drink ETOH.  Echo in 3/22 showed EF 30-35%, with normal RV.  RHC in 5/22 was worrisome, showing right > left heart failure and low cardiac output with low PAPI.   Zio patch in 8/22 showed 21% PVCs.  Amiodarone  was started.  Cardiac MRI in 9/22 showed EF 40%, septal-lateral dyssynchrony, RV EF 45%, LGE in the mid-wall apical lateral and apical septal walls. Repeat Zio patch in 10/22 showed 5.8% PVCs, improved.    Patient stopped Farxiga  after 3 yeast infections.   Echo in 5/23 showed EF 35-40%, mildly decreased RV systolic function, trivial MR.  Echo in 6/24 showed EF up to 50-55%, normal RV, mild MR, IVC normal. PFTs showed mild obstruction from emphysema.   Patient was admitted in 10/24 with hypotension and AKI. Midodrine  was started and atorvastatin  (LFTs were elevated), Protonix , spironolactone , torsemide , and valsartan  were all stopped.  She was eventually titrated off midodrine .  Echo this admission showed EF 60-65% with normal RV size and systolic function.   Seen in [facility] 07/25 where ReDs reading was elevated. Lasix  40mg  was given for 3 days with reduction to 20mg . Valsartan  was started.   Seen in [facility] 06/30/24 where she was fluid up. Lasix  increased to 60mg  daily X 3 days and then reduced back to 20mg  daily.  She presents today, with her husband, for a HF follow-up  visit with shortness of breath. Has associated fatigue, chest pressure at times. Denies chest pain, palpitations, dizziness, pedal edema. Chronic difficulty sleeping. Has been taking 40mg  furosemide  daily. Says that she was on torsemide  in the past but it didn't work as well for her. Drinks 1 gallon of unsweetened tea daily, no water .   Has upcoming echo next week, pulmonology f/u in November. Has not heard anything from GI office regarding an appointment.   Labs (6/22): K 4.4, creatinine 0.79 Labs (2/23): K 4.8, creatinine 0.95, LDL 18, TSH 5.37, ALT 102, AST 95, hgb 15.2 Labs (8/23): LDL 74, K 4.3, creatinine 9.07, AST 33, ALT 46 Labs (2/24): LDL 19, TGs 173 Labs (3/24): K 4.9, creatinine 0.99, AST 127, ALT 132 Labs (5/24): K 4.7, creatinine 1.07, AST 147, ALT 73 Labs (6/24): TSH 9.58, LDL 26 Labs (11/24): K 4.4, creatinine 0.85, AST 83, ALT 99 Labs (12/24): TSH mildly elevated Labs (4/25): LDL 27, AST 84, ALT 82, alkaline phosphatase 212, TSH normal Labs (07/25): K 4.1/ 4.2, creatinine 0.54/ 0.68, LDL 25, A1c 11.3%, TSH normal Labs (08/25): K 4.1, creatinine 0.68, BNP 15.4 Labs (09/25): K 4.7, creatinine 0.71  ECG: not done  PMH: 1. Chronic systolic CHF: Nonischemic cardiomyopathy.   - Echo (6/20): Normal EF - Cardiolite (6/20): Anterior ischemia.  - Echo (11/20): EF 25-30% - LHC (11/20): 70% pRCA stenosis, FFR negative.  - Echo (8/21): EF 30-35% - Coronary CTA (9/21): Mild RCA disease, 40-59% mLAD  stenosis but CT FFR negative.  - Echo (3/22): EF 30-35% with normal RV - RHC (5/22): mean RA 17, mean PA 28, mean PCWP 20, CI 1.3, PAPI < 1.  - Cardiac MRI (9/22): EF 40%, septal-lateral dyssynchrony, RV EF 45%, LGE in the mid-wall apical lateral and apical septal walls. - Echo (5/23): EF 35-40%, mildly decreased RV systolic function, trivial MR.   - Echo (6/24): EF up to 50-55%, normal RV, mild MR, IVC normal - Echo (10/24): EF 60-65% with normal RV size and systolic function.  2.  COPD: Mild.  Quit smoking in 2020.  - PFTs with mild obstruction consistent with emphysema.  3. Elevated LFTs: ?NAFLD - Abdominal US  (2/24): Fatty liver.  - HCV negative 4. Type 2 diabetes 5. Coronary artery disease: Nonobstructive.  6. Hyperlipidemia 7. OSA 8. GERD 9. Depression 10. PVCs: Zio patch in 8/22 showed 21% PVCs.  Amiodarone  was started. Repeat Zio patch in 10/22 showed 5.8% PVCs, improved.   11. Sleep study (8/22): No OSA.   FH: Brother with heart surgery, ?CHF.  SH: Married, no ETOH, quit smoking in 2020, retired, lives in [place]  ROS: All systems reviewed and negative except as per HPI.   Current Outpatient Medications  Medication Sig Dispense Refill   amiodarone  (PACERONE ) 200 MG tablet Take 0.5 tablets (100 mg total) by mouth daily. 45 tablet 3   aspirin  EC 81 MG tablet Take 1 tablet (81 mg total) by mouth daily. Swallow whole. 30 tablet 11   Continuous Blood Gluc Receiver (FREESTYLE LIBRE 2 READER) DEVI 1 each by Does not apply route daily. 1 each 0   Continuous Glucose Sensor (FREESTYLE LIBRE 2 PLUS SENSOR) MISC Change sensor every 15 days. 6 each 4   furosemide  (LASIX ) 40 MG tablet Take 1 tablet (40 mg total) by mouth daily. 90 tablet 3   glimepiride  (AMARYL ) 4 MG tablet Take 4 mg by mouth daily with breakfast.     insulin  degludec (TRESIBA  FLEXTOUCH) 200 UNIT/ML FlexTouch Pen Reduce from 100 units to 70 units. (Patient taking differently: Inject 88 Units into the skin daily.)     insulin  lispro (HUMALOG  KWIKPEN) 100 UNIT/ML KwikPen Inject 20 Units into the skin 3 (three) times daily. With meals. (Patient taking differently: Inject 22 Units into the skin 3 (three) times daily. With meals.) 60 mL 0   Insulin  Pen Needle 30G X 6 MM MISC 1 Application by Does not apply route daily. 100 each 2   Lancets (ONETOUCH DELICA PLUS LANCET30G) MISC TEST ONCE DAILY 100 each 0   levothyroxine  (SYNTHROID ) 88 MCG tablet Take 1 tablet (88 mcg total) by mouth daily. 90 tablet 1    metFORMIN  (GLUCOPHAGE ) 1000 MG tablet TAKE 1 TABLET BY MOUTH TWICE  DAILY WITH MEALS 200 tablet 0   metoprolol  succinate (TOPROL -XL) 25 MG 24 hr tablet TAKE 1 TABLET BY MOUTH DAILY (Patient taking differently: Take 12.5 mg by mouth daily. TAKE 1 TABLET BY MOUTH DAILY) 90 tablet 3   nitroGLYCERIN  (NITROSTAT ) 0.3 MG SL tablet Place 1 tablet (0.3 mg total) under the tongue every 5 (five) minutes as needed for chest pain. 100 tablet 3   ONETOUCH ULTRA test strip TEST ONCE DAILY 100 strip 3   pantoprazole  (PROTONIX ) 40 MG tablet Take 1 tablet (40 mg total) by mouth daily. 100 tablet 3   polyethylene glycol powder (GLYCOLAX /MIRALAX ) 17 GM/SCOOP powder Take 17 g by mouth daily.     QUEtiapine  (SEROQUEL ) 200 MG tablet TAKE 1 TABLET BY MOUTH DAILY  AT  BEDTIME 90 tablet 3   Semaglutide (RYBELSUS) 3 MG TABS Take 3 mg by mouth daily. (Patient not taking: Reported on 07/07/2024)     spironolactone  (ALDACTONE ) 25 MG tablet Take 1 tablet (25 mg total) by mouth daily. 90 tablet 3   traMADol  (ULTRAM ) 50 MG tablet Take 1 tablet (50 mg total) by mouth every 8 (eight) hours as needed for severe pain (pain score 7-10). 30 tablet 0   valsartan  (DIOVAN ) 40 MG tablet Take 0.5 tablets (20 mg total) by mouth daily. 45 tablet 1   No current facility-administered medications for this visit.   Vitals:   07/21/24 0934  BP: (!) 106/50  Pulse: 78  SpO2: 95%  Weight: 223 lb 9.6 oz (101.4 kg)   Wt Readings from Last 3 Encounters:  07/21/24 223 lb 9.6 oz (101.4 kg)  07/07/24 222 lb 12.8 oz (101.1 kg)  06/30/24 224 lb 2 oz (101.7 kg)   Lab Results  Component Value Date   CREATININE 0.59 07/07/2024   CREATININE 0.71 06/30/2024   CREATININE 0.68 05/26/2024    Physical Exam:   General: Well appearing.  Cor: No JVD. Regular rhythm, rate.  Lungs: clear Abdomen: soft, nontender, nondistended. Extremities: no edema Neuro:. Affect pleasant, anxious  ReDs reading: 35 %, normal    Assessment/Plan:  1. Chronic  systolic CHF: Nonischemic cardiomyopathy.  Echo in 3/22 with EF 30-35%, normal RV.  LHC in 11/20 and coronary CTA in 9/21 showed nonobstructive CAD.  RHC in 5/22 was worrisome with R>L heart failure and low cardiac output as well as low PAPI.  Cardiac MRI in 9/22 showed EF 40%, septal-lateral dyssynchrony, RV EF 45%, LGE in the mid-wall apical lateral and apical septal walls.  This was concerning for possible myocarditis (less likely amyloidosis or sarcoidosis).  Echo in 5/23 with EF 35-40%, mildly decreased RV systolic function, trivial MR.  Also concern that PVCs may play a role as Zio monitor in 8/22 showed 21% PVCs, improved to 5.8% with amiodarone .  Echo in 6/24 showed EF up to 50-55%, normal RV, mild MR, IVC normal.  Improvement in EF may be related to suppression of PVCs. Echo in 10/24 showed normal EF 60-65% with normal RV.   - NYHA class III - ReDs today is 35%; two weeks ago it was 35% - weight stable from last visit here 2 weeks ago - Updated echo scheduled for 10/25 - Continue Lasix  40mg  daily. BMET today.  - Continue valsartan  20 mg daily. Cough w/ entresto  - Continue spironolactone  25 daily.  - Off dapagliflozin  with multiple yeast infections.  - Continue Toprol  XL to 25 mg daily.  - Discussed the importance of decreasing her fluid intake from 1 gallon to 1/2 gallon and substituting water  for her unsweet tea.  - BNP 05/14/24 was 70.9.  - BMET 07/07/24 reviewed: sodium 136, potassium 4.6, creatinine 0.59 and GFR >60  2. CAD: nonobstructive by LHC in 11/20 and coronary CTA in 9/21.  - Off atorvastatin  with elevated LFTs.  Last LDL in 07/25 was 25.  - Continue ASA 81 daily.   3. PVCs: 21% PVCs on 08/22 Zio monitor.  ?Playing role in cardiomyopathy. PVCs down to 5.8% on Zio in 10/22 on amiodarone .  This may have led to improvement in EF by echo in 6/24 and in 10/24.  - Continue amiodarone  100 mg daily.  On Levoxyl  for hypothyroidism managed by PCP.  Has chronic mild elevation in  LFTs.  4. Elevated LFTs: This appears to be  chronic and mild.  LFT elevation pre-dates amiodarone  use, and amiodarone  does not seem to have worsened LFTs.  Suspect NAFLD.  She had workup for viral hepatitis (negative) in the past.   - Statin was stopped. Amiodarone  at lowest dose (100 mg daily).  - LFT's checked 07/25 and still elevated. GI referral to [facility] placed at last visit and she is awaiting an appointment. GI office # provided to patient today. - As long as there is not a significant rise, continue amiodarone  at low dose. Could consider changing to mexiletine.  - RUQ US  05/21/24:  Diffusely echogenic hepatic parenchyma consistent with fatty liver infiltration  - drank alcohol as teenager/ young adult but says that it wasn't excessive  5. COPD: Prior smoker.  I suspect COPD plays a role in her dyspnea. PFTs with mild obstruction suggesting a degree of emphysema.  - Follow with pulmonary.    Return in 1 month with Dr Rolan, sooner if needed.   I spent 39 minutes reviewing records, interviewing/ examing patient and managing plan/ orders.    Ellouise DELENA Class, FNP 07/20/24

## 2024-07-20 NOTE — Telephone Encounter (Signed)
 Called to confirm/remind patient of their appointment at the Advanced [facility] Failure Clinic on 07/21/24.   Appointment:   [x] Confirmed  [] Left mess   [] No answer/No voice mail  [] VM Full/unable to leave message  [] Phone not in service  Patient reminded to bring all medications and/or complete list.  Confirmed patient has transportation. Gave directions, instructed to utilize valet parking.

## 2024-07-21 ENCOUNTER — Ambulatory Visit: Attending: Family | Admitting: Family

## 2024-07-21 ENCOUNTER — Other Ambulatory Visit: Payer: Self-pay

## 2024-07-21 ENCOUNTER — Encounter: Payer: Self-pay | Admitting: Family

## 2024-07-21 VITALS — BP 106/50 | HR 78 | Wt 223.6 lb

## 2024-07-21 DIAGNOSIS — J439 Emphysema, unspecified: Secondary | ICD-10-CM | POA: Diagnosis not present

## 2024-07-21 DIAGNOSIS — Z87891 Personal history of nicotine dependence: Secondary | ICD-10-CM | POA: Insufficient documentation

## 2024-07-21 DIAGNOSIS — Z794 Long term (current) use of insulin: Secondary | ICD-10-CM | POA: Diagnosis not present

## 2024-07-21 DIAGNOSIS — E119 Type 2 diabetes mellitus without complications: Secondary | ICD-10-CM | POA: Diagnosis not present

## 2024-07-21 DIAGNOSIS — J410 Simple chronic bronchitis: Secondary | ICD-10-CM | POA: Diagnosis not present

## 2024-07-21 DIAGNOSIS — Z7984 Long term (current) use of oral hypoglycemic drugs: Secondary | ICD-10-CM | POA: Insufficient documentation

## 2024-07-21 DIAGNOSIS — J449 Chronic obstructive pulmonary disease, unspecified: Secondary | ICD-10-CM | POA: Insufficient documentation

## 2024-07-21 DIAGNOSIS — K7689 Other specified diseases of liver: Secondary | ICD-10-CM | POA: Insufficient documentation

## 2024-07-21 DIAGNOSIS — I2510 Atherosclerotic heart disease of native coronary artery without angina pectoris: Secondary | ICD-10-CM | POA: Insufficient documentation

## 2024-07-21 DIAGNOSIS — R0600 Dyspnea, unspecified: Secondary | ICD-10-CM | POA: Insufficient documentation

## 2024-07-21 DIAGNOSIS — I25118 Atherosclerotic heart disease of native coronary artery with other forms of angina pectoris: Secondary | ICD-10-CM

## 2024-07-21 DIAGNOSIS — K760 Fatty (change of) liver, not elsewhere classified: Secondary | ICD-10-CM | POA: Insufficient documentation

## 2024-07-21 DIAGNOSIS — R0602 Shortness of breath: Secondary | ICD-10-CM | POA: Diagnosis present

## 2024-07-21 DIAGNOSIS — Z79899 Other long term (current) drug therapy: Secondary | ICD-10-CM | POA: Insufficient documentation

## 2024-07-21 DIAGNOSIS — I5022 Chronic systolic (congestive) heart failure: Secondary | ICD-10-CM | POA: Insufficient documentation

## 2024-07-21 DIAGNOSIS — R7989 Other specified abnormal findings of blood chemistry: Secondary | ICD-10-CM | POA: Diagnosis not present

## 2024-07-21 DIAGNOSIS — I428 Other cardiomyopathies: Secondary | ICD-10-CM | POA: Diagnosis not present

## 2024-07-21 DIAGNOSIS — G479 Sleep disorder, unspecified: Secondary | ICD-10-CM | POA: Insufficient documentation

## 2024-07-21 DIAGNOSIS — E1142 Type 2 diabetes mellitus with diabetic polyneuropathy: Secondary | ICD-10-CM

## 2024-07-21 DIAGNOSIS — Z7982 Long term (current) use of aspirin: Secondary | ICD-10-CM | POA: Diagnosis not present

## 2024-07-21 DIAGNOSIS — I493 Ventricular premature depolarization: Secondary | ICD-10-CM | POA: Insufficient documentation

## 2024-07-21 DIAGNOSIS — R5383 Other fatigue: Secondary | ICD-10-CM | POA: Diagnosis not present

## 2024-07-21 LAB — BASIC METABOLIC PANEL WITH GFR
BUN/Creatinine Ratio: 23 (ref 12–28)
BUN: 19 mg/dL (ref 8–27)
CO2: 21 mmol/L (ref 20–29)
Calcium: 8.9 mg/dL (ref 8.7–10.3)
Chloride: 100 mmol/L (ref 96–106)
Creatinine, Ser: 0.82 mg/dL (ref 0.57–1.00)
Glucose: 227 mg/dL — ABNORMAL HIGH (ref 70–99)
Potassium: 4.5 mmol/L (ref 3.5–5.2)
Sodium: 139 mmol/L (ref 134–144)
eGFR: 76 mL/min/1.73 (ref >59)

## 2024-07-21 MED ORDER — FUROSEMIDE 40 MG PO TABS: 40.0000 mg | ORAL_TABLET | Freq: Every day | ORAL | Status: DC

## 2024-07-21 NOTE — Progress Notes (Signed)
   07/21/2024  Patient ID: Kim Buchanan, female   DOB: [birth year], 73 y.o.   MRN: [id number]  Contacted patient to make her aware [facility] has Rybelsus 3mg  sample set aside for her to pick up at her convenience during their business hours.  Telephone visit scheduled in 2 weeks to check on tolerance/efficacy.  [name], PharmD, DPLA

## 2024-07-21 NOTE — Patient Instructions (Signed)
 Medication Changes:  No medication changes today!  Lab Work:   Go downstairs to [place] on LOWER LEVEL to have your blood work completed.  We will only call you if the results are abnormal or if the provider would like to make medication changes.  No news is good news.    Special Instructions // Education:  The number to [facility] is [phone]  Follow-Up in: Please follow up with the Advanced Heart Failure Clinic in 1 month with Dr. Rolan.    Thank you for choosing [facility] Failure Clinic.    At the Advanced Heart Failure Clinic, you and your health needs are our priority. We have a designated team specialized in the treatment of Heart Failure. This Care Team includes your primary Heart Failure Specialized Cardiologist (physician), Advanced Practice Providers (APPs- Physician Assistants and Nurse Practitioners), and Pharmacist who all work together to provide you with the care you need, when you need it.   You may see any of the following providers on your designated Care Team at your next follow up:  Dr. Toribio Fuel Dr. Ezra Rolan Dr. Ria Commander Dr. Morene Brownie Ellouise Class, FNP Jaun Bash, RPH-CPP  Please be sure to bring in all your medications bottles to every appointment.   Need to Contact Us :  If you have any questions or concerns before your next appointment please send us  a message through [place] or call our office at [phone].    TO LEAVE A MESSAGE FOR THE NURSE SELECT OPTION 2, PLEASE LEAVE A MESSAGE INCLUDING: YOUR NAME DATE OF BIRTH CALL BACK NUMBER REASON FOR CALL**this is important as we prioritize the call backs  YOU WILL RECEIVE A CALL BACK THE SAME DAY AS LONG AS YOU CALL BEFORE 4:00 PM

## 2024-07-21 NOTE — Progress Notes (Signed)
 ReDS Vest / Clip - 07/21/24 0900       ReDS Vest / Clip   Station Marker B    Ruler Value 28    ReDS Value Range Low volume    ReDS Actual Value 35

## 2024-07-21 NOTE — Progress Notes (Signed)
   07/21/2024  Patient ID: Kim Buchanan, female   DOB: 10/08/1951, 73 y.o.   MRN: [id number]  Subjective/Objective Telephone visit to follow-up on management of diabetes  Diabetes -Current medications:  Tresiba  88 units HS, Humalog  22 units TID with meals, metformin  1000mg  BID, glimepiride  4mg  with breakfast -Medications tried in the past: Ozempic- patient stopped taking after hospitalization in Oct of 2024 but is open to restarting this or Rybelsus -[employer] [facility] for Rybelsus 3mg /7mg  has been approved and should arrive at [facility] in the next 2 weeks -A1c of 11.3% 7/29 -Humalog  and Tresiba  were increased by 10% after our last telephone visit 2 weeks ago, but patient states BG continues to run 200-300 -Patient is using Libre 2+ for CGM -Patient also receives Tresiba  through [employer], and 6 boxes were picked up last week  Assessment/Plan  Diabetes -Uncontrolled -Patient will take Rybelsus 3mg  daily x1 month, increasing to 7mg  daily thereafter once received from [employer].   -Contacted [facility] to see if they have a sample of Rybelsus 3mg  we could go ahead and get patient started on  Follow-up:  will notify about sample and schedule a follow-up in another 2-4 weeks  Kim Buchanan, PharmD, DPLA

## 2024-07-22 ENCOUNTER — Ambulatory Visit: Payer: Self-pay | Admitting: Family

## 2024-07-27 ENCOUNTER — Ambulatory Visit: Payer: Self-pay | Admitting: Family

## 2024-07-27 ENCOUNTER — Ambulatory Visit
Admission: RE | Admit: 2024-07-27 | Discharge: 2024-07-27 | Disposition: A | Discharge status: Home / Self Care | Source: Ambulatory Visit | Attending: Family | Admitting: Family

## 2024-07-27 DIAGNOSIS — I5022 Chronic systolic (congestive) heart failure: Secondary | ICD-10-CM | POA: Diagnosis not present

## 2024-07-27 LAB — ECHOCARDIOGRAM COMPLETE
AR max vel: 2.64 cm2
AV Area VTI: 2.6 cm2
AV Area mean vel: 2.63 cm2
AV Mean grad: 5 mmHg
AV Peak grad: 9.1 mmHg
Ao pk vel: 1.51 m/s
Area-P 1/2: 4.06 cm2
Calc EF: 65.4 %
MV VTI: 3.78 cm2
S' Lateral: 3.4 cm
Single Plane A2C EF: 66.7 %
Single Plane A4C EF: 67.3 %

## 2024-07-27 MED ORDER — PERFLUTREN LIPID MICROSPHERE
1.0000 mL | INTRAVENOUS | Status: AC | PRN
  Administered 2024-07-27: 2 mL via INTRAVENOUS

## 2024-07-29 ENCOUNTER — Other Ambulatory Visit: Payer: Self-pay

## 2024-07-29 NOTE — Patient Instructions (Signed)
 Visit Information  Thank you for taking time to visit with me today. Please don't hesitate to contact me if I can be of assistance to you before our next scheduled appointment.  Your next care management appointment is by telephone on Wednesday, November 5th  at 11:00am.   Please call the care guide team at [phone] if you need to cancel, schedule, or reschedule an appointment.   A reminder to ALL patients/family/friends, please call the [place]  National Suicide Prevention Lifeline: 579-763-9022 or TTY: 763-237-5517 TTY 509-674-0348) to talk to a trained counselor if you are experiencing a Mental Health or Behavioral Health Crisis or need someone to talk to.  Santana Stamp BSN, CCM [place]  VBCI Population Health RN Care Manager Direct Dial : [phone]  Fax: [phone]

## 2024-07-29 NOTE — Patient Outreach (Signed)
 Complex Care Management   Visit Note  07/29/2024  Name:  Kim Buchanan MRN: [id number] DOB: 09-20-1951  Situation: Referral received for Complex Care Management related to assistance with multiple appointments. I obtained verbal consent from Patient.  Visit completed with Kim Buchanan  on the phone.  Reports compliance with Rybelsus as prescribed by Kim Buchanan, RPH week of 07/21/24, understands she is taking samples, Kim will call her 10/14 to assess for side effects/efficacy.   Background:   Past Medical History:  Diagnosis Date   Anxiety    Arthritis    lower back   COPD (chronic obstructive pulmonary disease) (HCC)    Depression    Diabetes mellitus without complication (HCC)    GERD (gastroesophageal reflux disease)    Hyperlipidemia    Morbid obesity (HCC)    NICM (nonischemic cardiomyopathy) (HCC)    a. 03/2019 Echo: Nl LVEF; b. 09/04/2019 Echo: EF 25-30%; c. 09/11/2019 Cath: nonobs LAD/RCA dzs. EF 50-55%; c. 12/2020 Echo: EF 30-35%, glob HK. Mild LVH. Nl RVSP. Mild MR; d. 02/2021 RHC: Elev RH pressures. CO/CI 2.8/1.3; e. 06/2021 cMRI: EF 40%, marked sept-lat dyssynch & septal HK. Small, discrete areas of mid-wall LGE in apical lateral and apical septal walls - ? h/o myocarditis.   Nonobstructive CAD (coronary artery disease)    a. 03/2019 [facility] [place]): EF 63%, ant ischemia; b. 08/2019 Cath: LM nl, LAD 90m, RCA 70p (iFR nl @ 0.97), 50p. EF 50-55%-->Med Rx; c. 06/2020 Cor CTA: Nl FFRs throughout cor tree.   Osteoporosis    Panic attack    PTSD (post-traumatic stress disorder)    PVC's (premature ventricular contractions)    a. 05/2021 Zio: 20.6% PVC burden. 13.4 secs of NSVT-->amio added; b. 06/2021 Zio: 5.8% PVC burden. Rare supraventrciular ectopy.   Rectal fistula    Sleep apnea    a. Prev wore CPAP but lost insurance and hasn't been using since (now has insurance).   Wears dentures    full upper    Assessment: Patient Reported Symptoms:  Cognitive Cognitive Status: Alert  and oriented to person, place, and time, No symptoms reported, Normal speech and language skills      Neurological Neurological Review of Symptoms: No symptoms reported    HEENT HEENT Symptoms Reported: Not assessed      Cardiovascular Cardiovascular Symptoms Reported: No symptoms reported    Respiratory Respiratory Symptoms Reported: No symptoms reported    Endocrine Endocrine Symptoms Reported: Hyperglycemia Is patient diabetic?: Yes Is patient checking blood sugars at home?: Yes List most recent blood sugar readings, include date and time of day: Reports this AM's FBG: 188mg /dL Endocrine Comment: Started taking Rybelsus week of 07/21/24, RPH provided samples to use, RPH has called scheduled for 08/04/24 to check on tolerance/efficacy.  Gastrointestinal Gastrointestinal Symptoms Reported: No symptoms reported      Genitourinary Genitourinary Symptoms Reported: No symptoms reported    Integumentary Integumentary Symptoms Reported: Not assessed    Musculoskeletal Musculoskelatal Symptoms Reviewed: Not assessed        Psychosocial Psychosocial Symptoms Reported: Not assessed          07/29/2024    PHQ2-9 Depression Screening   Little interest or pleasure in doing things    Feeling down, depressed, or hopeless    PHQ-2 - Total Score    Trouble falling or staying asleep, or sleeping too much    Feeling tired or having little energy    Poor appetite or overeating     Feeling bad about yourself -  or that you are a failure or have let yourself or your family down    Trouble concentrating on things, such as reading the newspaper or watching television    Moving or speaking so slowly that other people could have noticed.  Or the opposite - being so fidgety or restless that you have been moving around a lot more than usual    Thoughts that you would be better off dead, or hurting yourself in some way    PHQ2-9 Total Score    If you checked off any problems, how difficult have  these problems made it for you to do your work, take care of things at home, or get along with other people    Depression Interventions/Treatment      There were no vitals filed for this visit.  MEDICATIONS: Denies concerns, no difficulties with refills, taking as prescribed, RPH started her on Rybelsus week of 07/21/24, she denies adverse side effects.    Recommendation:   Specialty provider follow-up :Kim Buchanan [id number]; Kim Buchanan new patient scheduled 08/10/24.  Continue Current Plan of Care  Follow Up Plan:   Telephone follow-up in 1 month  Kim Buchanan BSN, CCM [place]  VBCI Population Health RN Care Manager Direct Dial : [phone]  Fax: [phone]

## 2024-07-31 ENCOUNTER — Inpatient Hospital Stay

## 2024-07-31 ENCOUNTER — Observation Stay
Admission: EM | Admit: 2024-07-31 | Discharge: 2024-08-01 | Disposition: A | Discharge status: Home / Self Care | Attending: Student | Admitting: Student

## 2024-07-31 ENCOUNTER — Emergency Department

## 2024-07-31 ENCOUNTER — Other Ambulatory Visit: Payer: Self-pay

## 2024-07-31 DIAGNOSIS — M47812 Spondylosis without myelopathy or radiculopathy, cervical region: Secondary | ICD-10-CM | POA: Diagnosis not present

## 2024-07-31 DIAGNOSIS — R748 Abnormal levels of other serum enzymes: Secondary | ICD-10-CM | POA: Diagnosis not present

## 2024-07-31 DIAGNOSIS — I110 Hypertensive heart disease with heart failure: Secondary | ICD-10-CM | POA: Insufficient documentation

## 2024-07-31 DIAGNOSIS — E039 Hypothyroidism, unspecified: Secondary | ICD-10-CM | POA: Diagnosis not present

## 2024-07-31 DIAGNOSIS — I428 Other cardiomyopathies: Secondary | ICD-10-CM | POA: Insufficient documentation

## 2024-07-31 DIAGNOSIS — Z79899 Other long term (current) drug therapy: Secondary | ICD-10-CM | POA: Insufficient documentation

## 2024-07-31 DIAGNOSIS — E1142 Type 2 diabetes mellitus with diabetic polyneuropathy: Secondary | ICD-10-CM | POA: Diagnosis not present

## 2024-07-31 DIAGNOSIS — F411 Generalized anxiety disorder: Secondary | ICD-10-CM | POA: Diagnosis not present

## 2024-07-31 DIAGNOSIS — J449 Chronic obstructive pulmonary disease, unspecified: Secondary | ICD-10-CM | POA: Diagnosis not present

## 2024-07-31 DIAGNOSIS — Z794 Long term (current) use of insulin: Secondary | ICD-10-CM | POA: Diagnosis not present

## 2024-07-31 DIAGNOSIS — E1169 Type 2 diabetes mellitus with other specified complication: Secondary | ICD-10-CM | POA: Insufficient documentation

## 2024-07-31 DIAGNOSIS — R55 Syncope and collapse: Principal | ICD-10-CM | POA: Insufficient documentation

## 2024-07-31 DIAGNOSIS — E7849 Other hyperlipidemia: Secondary | ICD-10-CM | POA: Diagnosis not present

## 2024-07-31 DIAGNOSIS — Z7982 Long term (current) use of aspirin: Secondary | ICD-10-CM | POA: Diagnosis not present

## 2024-07-31 DIAGNOSIS — R2241 Localized swelling, mass and lump, right lower limb: Secondary | ICD-10-CM | POA: Diagnosis not present

## 2024-07-31 DIAGNOSIS — M4802 Spinal stenosis, cervical region: Secondary | ICD-10-CM | POA: Diagnosis not present

## 2024-07-31 DIAGNOSIS — R160 Hepatomegaly, not elsewhere classified: Secondary | ICD-10-CM | POA: Diagnosis not present

## 2024-07-31 DIAGNOSIS — M51369 Other intervertebral disc degeneration, lumbar region without mention of lumbar back pain or lower extremity pain: Secondary | ICD-10-CM | POA: Diagnosis not present

## 2024-07-31 DIAGNOSIS — K760 Fatty (change of) liver, not elsewhere classified: Secondary | ICD-10-CM | POA: Insufficient documentation

## 2024-07-31 DIAGNOSIS — K219 Gastro-esophageal reflux disease without esophagitis: Secondary | ICD-10-CM | POA: Insufficient documentation

## 2024-07-31 DIAGNOSIS — R609 Edema, unspecified: Secondary | ICD-10-CM | POA: Diagnosis not present

## 2024-07-31 DIAGNOSIS — K828 Other specified diseases of gallbladder: Secondary | ICD-10-CM | POA: Diagnosis not present

## 2024-07-31 DIAGNOSIS — K838 Other specified diseases of biliary tract: Secondary | ICD-10-CM | POA: Diagnosis not present

## 2024-07-31 DIAGNOSIS — I2510 Atherosclerotic heart disease of native coronary artery without angina pectoris: Secondary | ICD-10-CM | POA: Diagnosis not present

## 2024-07-31 DIAGNOSIS — Z743 Need for continuous supervision: Secondary | ICD-10-CM | POA: Diagnosis not present

## 2024-07-31 DIAGNOSIS — R404 Transient alteration of awareness: Secondary | ICD-10-CM | POA: Diagnosis not present

## 2024-07-31 DIAGNOSIS — I5022 Chronic systolic (congestive) heart failure: Secondary | ICD-10-CM | POA: Diagnosis not present

## 2024-07-31 DIAGNOSIS — R7989 Other specified abnormal findings of blood chemistry: Secondary | ICD-10-CM | POA: Diagnosis not present

## 2024-07-31 LAB — COMPREHENSIVE METABOLIC PANEL WITH GFR
ALT: 84 U/L — ABNORMAL HIGH (ref 0–44)
AST: 96 U/L — ABNORMAL HIGH (ref 15–41)
Albumin: 3.8 g/dL (ref 3.5–5.0)
Alkaline Phosphatase: 118 U/L (ref 38–126)
Anion gap: 18 — ABNORMAL HIGH (ref 5–15)
BUN: 17 mg/dL (ref 8–23)
CO2: 22 mmol/L (ref 22–32)
Calcium: 8.9 mg/dL (ref 8.9–10.3)
Chloride: 102 mmol/L (ref 98–111)
Creatinine, Ser: 0.77 mg/dL (ref 0.44–1.00)
GFR, Estimated: >60 mL/min (ref >60)
Glucose, Bld: 245 mg/dL — ABNORMAL HIGH (ref 70–99)
Potassium: 3.7 mmol/L (ref 3.5–5.1)
Sodium: 142 mmol/L (ref 135–145)
Total Bilirubin: 1.4 mg/dL — ABNORMAL HIGH (ref 0.0–1.2)
Total Protein: 7.7 g/dL (ref 6.5–8.1)

## 2024-07-31 LAB — URINALYSIS, ROUTINE W REFLEX MICROSCOPIC
Bilirubin Urine: NEGATIVE
Glucose, UA: 50 mg/dL — AB
Hgb urine dipstick: NEGATIVE
Ketones, ur: NEGATIVE mg/dL
Leukocytes,Ua: NEGATIVE
Nitrite: NEGATIVE
Protein, ur: NEGATIVE mg/dL
Specific Gravity, Urine: 1.009 (ref 1.005–1.030)
pH: 5 (ref 5.0–8.0)

## 2024-07-31 LAB — LACTIC ACID, PLASMA
Lactic Acid, Venous: 4.5 mmol/L (ref 0.5–1.9)
Lactic Acid, Venous: 3.7 mmol/L (ref 0.5–1.9)
Lactic Acid, Venous: 3.1 mmol/L (ref 0.5–1.9)

## 2024-07-31 LAB — CBG MONITORING, ED
Glucose-Capillary: 265 mg/dL — ABNORMAL HIGH (ref 70–99)
Glucose-Capillary: 212 mg/dL — ABNORMAL HIGH (ref 70–99)

## 2024-07-31 LAB — TROPONIN I (HIGH SENSITIVITY)
Troponin I (High Sensitivity): 4 ng/L (ref <18)
Troponin I (High Sensitivity): 8 ng/L (ref <18)

## 2024-07-31 LAB — CBC
HCT: 37.9 % (ref 36.0–46.0)
Hemoglobin: 12.1 g/dL (ref 12.0–15.0)
MCH: 28.3 pg (ref 26.0–34.0)
MCHC: 31.9 g/dL (ref 30.0–36.0)
MCV: 88.8 fL (ref 80.0–100.0)
Platelets: 134 K/uL — ABNORMAL LOW (ref 150–400)
RBC: 4.27 MIL/uL (ref 3.87–5.11)
RDW: 16.6 % — ABNORMAL HIGH (ref 11.5–15.5)
WBC: 3.1 K/uL — ABNORMAL LOW (ref 4.0–10.5)
nRBC: 0 % (ref 0.0–0.2)

## 2024-07-31 MED ORDER — INSULIN ASPART 100 UNIT/ML IJ SOLN
0.0000 [IU] | Freq: Every day | INTRAMUSCULAR | Status: DC
  Administered 2024-07-31: 2 [IU] via SUBCUTANEOUS
  Filled 2024-07-31: qty 2

## 2024-07-31 MED ORDER — PANTOPRAZOLE SODIUM 40 MG PO TBEC
40.0000 mg | DELAYED_RELEASE_TABLET | Freq: Every day | ORAL | Status: DC
  Administered 2024-08-01: 40 mg via ORAL
  Filled 2024-07-31: qty 1

## 2024-07-31 MED ORDER — QUETIAPINE FUMARATE 200 MG PO TABS
200.0000 mg | ORAL_TABLET | Freq: Every day | ORAL | Status: DC
  Administered 2024-07-31: 200 mg via ORAL
  Filled 2024-07-31: qty 1

## 2024-07-31 MED ORDER — IRBESARTAN 75 MG PO TABS
37.5000 mg | ORAL_TABLET | Freq: Every day | ORAL | Status: DC
  Administered 2024-08-01: 37.5 mg via ORAL
  Filled 2024-07-31 (×2): qty 0.5

## 2024-07-31 MED ORDER — LACTATED RINGERS IV BOLUS
500.0000 mL | Freq: Once | INTRAVENOUS | Status: AC
  Administered 2024-07-31: 500 mL via INTRAVENOUS

## 2024-07-31 MED ORDER — LEVOTHYROXINE SODIUM 88 MCG PO TABS
88.0000 ug | ORAL_TABLET | Freq: Every day | ORAL | Status: DC
  Administered 2024-08-01: 88 ug via ORAL
  Filled 2024-07-31 (×2): qty 1

## 2024-07-31 MED ORDER — SPIRONOLACTONE 25 MG PO TABS
25.0000 mg | ORAL_TABLET | Freq: Every day | ORAL | Status: DC
  Administered 2024-08-01: 25 mg via ORAL
  Filled 2024-07-31: qty 1

## 2024-07-31 MED ORDER — IPRATROPIUM-ALBUTEROL 0.5-2.5 (3) MG/3ML IN SOLN: 3.0000 mL | RESPIRATORY_TRACT | Status: DC | PRN

## 2024-07-31 MED ORDER — SENNOSIDES-DOCUSATE SODIUM 8.6-50 MG PO TABS: 1.0000 | ORAL_TABLET | Freq: Every evening | ORAL | Status: DC | PRN

## 2024-07-31 MED ORDER — ASPIRIN 81 MG PO TBEC
81.0000 mg | DELAYED_RELEASE_TABLET | Freq: Every day | ORAL | Status: DC
Start: 2024-08-01 — End: 2024-08-02
  Administered 2024-08-01: 81 mg via ORAL
  Filled 2024-07-31: qty 1

## 2024-07-31 MED ORDER — INSULIN ASPART 100 UNIT/ML IJ SOLN
0.0000 [IU] | Freq: Three times a day (TID) | INTRAMUSCULAR | Status: DC
  Administered 2024-08-01: 3 [IU] via SUBCUTANEOUS
  Administered 2024-08-01: 5 [IU] via SUBCUTANEOUS
  Filled 2024-07-31 (×2): qty 1

## 2024-07-31 MED ORDER — METOPROLOL SUCCINATE ER 25 MG PO TB24
12.5000 mg | ORAL_TABLET | Freq: Every day | ORAL | Status: DC
  Administered 2024-08-01: 12.5 mg via ORAL
  Filled 2024-07-31: qty 1

## 2024-07-31 MED ORDER — FUROSEMIDE 40 MG PO TABS
40.0000 mg | ORAL_TABLET | Freq: Every day | ORAL | Status: DC
  Administered 2024-08-01: 40 mg via ORAL
  Filled 2024-07-31: qty 1

## 2024-07-31 MED ORDER — ENOXAPARIN SODIUM 40 MG/0.4ML IJ SOSY
40.0000 mg | PREFILLED_SYRINGE | INTRAMUSCULAR | Status: DC
  Administered 2024-07-31: 40 mg via SUBCUTANEOUS
  Filled 2024-07-31: qty 0.4

## 2024-07-31 MED ORDER — AMIODARONE HCL 200 MG PO TABS
100.0000 mg | ORAL_TABLET | Freq: Every day | ORAL | Status: DC
Start: 2024-08-01 — End: 2024-08-02
  Administered 2024-08-01: 100 mg via ORAL
  Filled 2024-07-31: qty 1

## 2024-07-31 NOTE — H&P (Addendum)
 History and Physical    Kim Buchanan FMW:[id number] DOB: 01-19-1951 DOA: 07/31/2024  DOS: the patient was seen and examined on 07/31/2024  PCP: Melvin Pao, NP   Patient coming from: Home  I have personally briefly reviewed patient's old medical records in [facility] and CareEverywhere  HPI:   Kim Buchanan is a 73 y.o. year old female with past medical history of PVCs, hypertension, hyperlipidemia, type 2 diabetes, heart failure with recovered ejection fraction (previous EF 30% but recently 55% 1 week ago), and COPD presenting to the ED after a syncopal episode.   Patient states she was shopping and felt dizzy and then passed out. States she felt dizzy and everything went dark within 5 seconds. Had LOC but never before. Had episode of dizziness previously once but that was remote and it resolved without intervention.   Pt has DOE at baseline. Does get palpitations but none this week.   On chart review, pt has hx of PVC where zio patch was placed that showed 21 % burden but improved to 6 % on amiodarone .   ED Course: On arrival to the ED patient hemodynamically stable.  Trauma imaging negative for any acute findings.  CBC with leukopenia and thrombocytopenia, CMP with hyperglycemia, and elevated LFTs including elevated bilirubin.  TRH contacted for admission.  Review of Systems: As mentioned in the history of present illness. All other systems reviewed and are negative.   Past Medical History:  Diagnosis Date   Anxiety    Arthritis    lower back   COPD (chronic obstructive pulmonary disease) (HCC)    Depression    Diabetes mellitus without complication (HCC)    GERD (gastroesophageal reflux disease)    Hyperlipidemia    Morbid obesity (HCC)    NICM (nonischemic cardiomyopathy) (HCC)    a. 03/2019 Echo: Nl LVEF; b. 09/04/2019 Echo: EF 25-30%; c. 09/11/2019 Cath: nonobs LAD/RCA dzs. EF 50-55%; c. 12/2020 Echo: EF 30-35%, glob HK. Mild LVH. Nl RVSP. Mild MR;  d. 02/2021 RHC: Elev RH pressures. CO/CI 2.8/1.3; e. 06/2021 cMRI: EF 40%, marked sept-lat dyssynch & septal HK. Small, discrete areas of mid-wall LGE in apical lateral and apical septal walls - ? h/o myocarditis.   Nonobstructive CAD (coronary artery disease)    a. 03/2019 [facility] [place]): EF 63%, ant ischemia; b. 08/2019 Cath: LM nl, LAD 23m, RCA 70p (iFR nl @ 0.97), 50p. EF 50-55%-->Med Rx; c. 06/2020 Cor CTA: Nl FFRs throughout cor tree.   Osteoporosis    Panic attack    PTSD (post-traumatic stress disorder)    PVC's (premature ventricular contractions)    a. 05/2021 Zio: 20.6% PVC burden. 13.4 secs of NSVT-->amio added; b. 06/2021 Zio: 5.8% PVC burden. Rare supraventrciular ectopy.   Rectal fistula    Sleep apnea    a. Prev wore CPAP but lost insurance and hasn't been using since (now has insurance).   Wears dentures    full upper    Past Surgical History:  Procedure Laterality Date   CARDIAC CATHETERIZATION     COLONOSCOPY  2014   COLONOSCOPY WITH PROPOFOL  N/A 10/01/2016   Procedure: COLONOSCOPY WITH PROPOFOL ;  Surgeon: Rogelia Copping, MD;  Location: [facility];  Service: Endoscopy;  Laterality: N/A;   COLONOSCOPY WITH PROPOFOL  N/A 01/04/2022   Procedure: COLONOSCOPY WITH PROPOFOL ;  Surgeon: Copping Rogelia, MD;  Location: [facility] ENDOSCOPY;  Service: Endoscopy;  Laterality: N/A;   CORONARY PRESSURE/FFR WITH 3D MAPPING N/A 09/11/2019   Procedure: Coronary Pressure Wire/FFR w/3D  Mapping;  Surgeon: Mady Bruckner, MD;  Location: [facility] INVASIVE CV LAB;  Service: Cardiovascular;  Laterality: N/A;   CYSTOCELE REPAIR N/A 12/17/2016   Procedure: ANTERIOR REPAIR (CYSTOCELE);  Surgeon: Gladis DELENA Dollar, MD;  Location: [facility] ORS;  Service: Gynecology;  Laterality: N/A;   ESOPHAGOGASTRODUODENOSCOPY (EGD) WITH PROPOFOL  N/A 08/11/2019   Procedure: ESOPHAGOGASTRODUODENOSCOPY (EGD) WITH PROPOFOL ;  Surgeon: Jinny Carmine, MD;  Location: [facility] ENDOSCOPY;  Service: Endoscopy;  Laterality: N/A;   POLYPECTOMY   10/01/2016   Procedure: POLYPECTOMY;  Surgeon: Carmine Jinny, MD;  Location: [facility];  Service: Endoscopy;;   RECTAL SURGERY  06/28/2015   Rectal prolapse, laparoscopic rectopexy the coldCharmont, MD; [facility]   RIGHT HEART CATH N/A 03/21/2021   Procedure: RIGHT HEART CATH;  Surgeon: Mady Bruckner, MD;  Location: [facility] INVASIVE CV LAB;  Service: Cardiovascular;  Laterality: N/A;   RIGHT/LEFT HEART CATH AND CORONARY ANGIOGRAPHY Bilateral 09/11/2019   Procedure: RIGHT/LEFT HEART CATH AND CORONARY ANGIOGRAPHY;  Surgeon: Perla Evalene PARAS, MD;  Location: [facility] INVAS[facility] CV LAB;  Service: Cardiovascular;  Laterality: Bilateral;   TOTAL ABDOMINAL HYSTERECTOMY W/ BILATERAL SALPINGOOPHORECTOMY     TUBAL LIGATION     VAGINAL HYSTERECTOMY Bilateral 12/17/2016   Procedure: HYSTERECTOMY VAGINAL WITH BILATERAL SALPINGO OOPHERECTOMY;  Surgeon: Gladis DELENA Dollar, MD;  Location: [facility] ORS;  Service: Gynecology;  Laterality: Bilateral;     Allergies  Allergen Reactions   Entresto  [Sacubitril-Valsartan ]     Cough    Lisinopril Cough    Family History  Problem Relation Age of Onset   Diabetes Mother    Diabetes Sister    Cancer Sister    Diabetes Brother    Lymphoma Brother    Heart disease Father    Hypertension Father    Diabetes Sister    Diabetes Brother    Lymphoma Brother    Heart disease Brother    Healthy Sister    Breast cancer Neg Hx    Ovarian cancer Neg Hx    Colon cancer Neg Hx     Prior to Admission medications   Medication Sig Start Date End Date Taking? Authorizing Provider  amiodarone  (PACERONE ) 200 MG tablet Take 0.5 tablets (100 mg total) by mouth daily. 10/04/23   Rolan Ezra RAMAN, MD  aspirin  EC 81 MG tablet Take 1 tablet (81 mg total) by mouth daily. Swallow whole. 09/04/23   Gerard Frederick, NP  Continuous Blood Gluc Receiver (FREESTYLE LIBRE 2 READER) DEVI 1 each by Does not apply route daily. 09/18/22   Melvin Pao, NP  Continuous Glucose Sensor  (FREESTYLE LIBRE 2 PLUS SENSOR) MISC Change sensor every 15 days. 06/15/24   Melvin Pao, NP  furosemide  (LASIX ) 40 MG tablet Take 1 tablet (40 mg total) by mouth daily. 07/21/24 10/19/24  Donette Ellouise DELENA, FNP  glimepiride  (AMARYL ) 4 MG tablet Take 4 mg by mouth daily with breakfast.    [provider]  insulin  degludec (TRESIBA  FLEXTOUCH) 200 UNIT/ML FlexTouch Pen Reduce from 100 units to 70 units. Patient taking differently: Inject 88 Units into the skin daily. 08/17/23   Awanda Ellouise, MD  insulin  lispro (HUMALOG  KWIKPEN) 100 UNIT/ML KwikPen Inject 20 Units into the skin 3 (three) times daily. With meals. Patient taking differently: Inject 22 Units into the skin 3 (three) times daily. With meals. 06/11/24   Melvin Pao, NP  Insulin  Pen Needle 30G X 6 MM MISC 1 Application by Does not apply route daily. 05/04/22   Melvin Pao, NP  Lancets [facility] DELICA PLUS LANCET30G) MISC TEST  ONCE DAILY 05/25/24   Melvin Pao, NP  levothyroxine  (SYNTHROID ) 88 MCG tablet Take 1 tablet (88 mcg total) by mouth daily. 05/20/24   Melvin Pao, NP  metFORMIN  (GLUCOPHAGE ) 1000 MG tablet TAKE 1 TABLET BY MOUTH TWICE  DAILY WITH MEALS 06/30/24   Melvin Pao, NP  metoprolol  succinate (TOPROL -XL) 25 MG 24 hr tablet TAKE 1 TABLET BY MOUTH DAILY Patient taking differently: Take 12.5 mg by mouth daily. TAKE 1 TABLET BY MOUTH DAILY 10/07/23   Gerard Frederick, NP  nitroGLYCERIN  (NITROSTAT ) 0.3 MG SL tablet Place 1 tablet (0.3 mg total) under the tongue every 5 (five) minutes as needed for chest pain. 08/22/22   Glena Harlene HERO, FNP  [facility] ULTRA test strip TEST ONCE DAILY 09/19/21   Melvin Pao, NP  pantoprazole  (PROTONIX ) 40 MG tablet Take 1 tablet (40 mg total) by mouth daily. 11/01/23   Rolan Ezra RAMAN, MD  polyethylene glycol powder (GLYCOLAX /MIRALAX ) 17 GM/SCOOP powder Take 17 g by mouth daily. 07/14/15   [provider]  QUEtiapine  (SEROQUEL ) 200 MG tablet TAKE 1 TABLET  BY MOUTH DAILY AT  BEDTIME 06/30/24   Melvin Pao, NP  Semaglutide (RYBELSUS) 3 MG TABS Take 3 mg by mouth daily. Patient not taking: Reported on 07/21/2024    [provider]  spironolactone  (ALDACTONE ) 25 MG tablet Take 1 tablet (25 mg total) by mouth daily. 05/28/24 08/26/24  Donette Ellouise LABOR, FNP  traMADol  (ULTRAM ) 50 MG tablet Take 1 tablet (50 mg total) by mouth every 8 (eight) hours as needed for severe pain (pain score 7-10). Patient not taking: Reported on 07/21/2024 05/19/24   Melvin Pao, NP  valsartan  (DIOVAN ) 40 MG tablet Take 0.5 tablets (20 mg total) by mouth daily. 05/14/24   Rolan Ezra RAMAN, MD      reports that she quit smoking about 5 years ago. Her smoking use included cigarettes. She started smoking about 55 years ago. She has a 75 pack-year smoking history. She has never used smokeless tobacco. She reports that she does not drink alcohol and does not use drugs. Lives with husband. Tobacco- 1.5 ppd started since 20 but quit 3-4 years ago.  EtOH- Denies use.  Illicit drug use- denies use.  IADLs/ADLs- can person independently at baseline    Physical Exam: Vitals:   07/31/24 1600 07/31/24 1630 07/31/24 1730 07/31/24 1902  BP: 115/66 (!) 111/50 106/73 (!) 144/81  Pulse: 83 83 81 86  Resp: (!) 23 19 (!) 24 16  Temp:    98.4 F (36.9 C)  TempSrc:    Oral  SpO2: 96% 93% 96% 96%     Gen: pleasant female in NAD HENT: MMM, NCAT CV: RRR, good pulses in all extremities Resp: CTAB Abd: No TTP, normal bowel sounds MSK: right leg with swelling compared to left Skin: no lesions on examined skin Neuro: alert and oriented x4 Psych: normal mood and affect   Labs on Admission: I have personally reviewed following labs and imaging studies  CBC: Recent Labs  Lab 07/31/24 1355  WBC 3.1*  HGB 12.1  HCT 37.9  MCV 88.8  PLT 134*   Basic Metabolic Panel: Recent Labs  Lab 07/31/24 1355  NA 142  K 3.7  CL 102  CO2 22  GLUCOSE 245*  BUN 17   CREATININE 0.77  CALCIUM  8.9   GFR: Estimated Creatinine Clearance: 73.3 mL/min (by C-G formula based on SCr of 0.77 mg/dL). Liver Function Tests: Recent Labs  Lab 07/31/24 1355  AST 96*  ALT  84*  ALKPHOS 118  BILITOT 1.4*  PROT 7.7  ALBUMIN 3.8   No results for input(s): LIPASE, AMYLASE in the last 168 hours. No results for input(s): AMMONIA in the last 168 hours. Coagulation Profile: No results for input(s): INR, PROTIME in the last 168 hours. Cardiac Enzymes: Recent Labs  Lab 07/31/24 1355 07/31/24 1555  TROPONINIHS 4 8   BNP (last 3 results) Recent Labs    11/01/23 1058 05/14/24 1501 05/26/24 1626  BNP 184.7* 70.9 15.4   HbA1C: No results for input(s): HGBA1C in the last 72 hours. CBG: Recent Labs  Lab 07/31/24 1418  GLUCAP 265*   Lipid Profile: No results for input(s): CHOL, HDL, LDLCALC, TRIG, CHOLHDL, LDLDIRECT in the last 72 hours. Thyroid  Function Tests: No results for input(s): TSH, T4TOTAL, FREET4, T3FREE, THYROIDAB in the last 72 hours. Anemia Panel: No results for input(s): VITAMINB12, FOLATE, FERRITIN, TIBC, IRON, RETICCTPCT in the last 72 hours. Urine analysis:    Component Value Date/Time   COLORURINE YELLOW (A) 07/31/2024 1604   APPEARANCEUR CLEAR (A) 07/31/2024 1604   APPEARANCEUR Clear 01/10/2023 1322   LABSPEC 1.009 07/31/2024 1604   LABSPEC 1.018 01/28/2013 1516   PHURINE 5.0 07/31/2024 1604   GLUCOSEU 50 (A) 07/31/2024 1604   GLUCOSEU 50 mg/dL 95/90/7985 8483   HGBUR NEGATIVE 07/31/2024 1604   BILIRUBINUR NEGATIVE 07/31/2024 1604   BILIRUBINUR Negative 01/10/2023 1322   BILIRUBINUR Negative 01/28/2013 1516   KETONESUR NEGATIVE 07/31/2024 1604   PROTEINUR NEGATIVE 07/31/2024 1604   UROBILINOGEN negative (A) 03/07/2017 1426   NITRITE NEGATIVE 07/31/2024 1604   LEUKOCYTESUR NEGATIVE 07/31/2024 1604   LEUKOCYTESUR Trace 01/28/2013 1516    Radiological Exams on Admission: I have  personally reviewed images US  Abdomen Limited RUQ (LIVER/GB) Result Date: 07/31/2024 CLINICAL DATA:  Elevated LFTs EXAM: ULTRASOUND ABDOMEN LIMITED RIGHT UPPER QUADRANT COMPARISON:  05/21/2024 FINDINGS: Gallbladder: Distended gallbladder. Small amount of sludge within the gallbladder. No stones, wall thickening or sonographic Murphy sign. Common bile duct: Diameter: Normal caliber, 6 mm. Liver: Enlarged and echogenic compatible with fatty infiltration. No focal abnormality. Portal vein is patent on color Doppler imaging with normal direction of blood flow towards the liver. Other: None. IMPRESSION: Hepatomegaly.  Diffuse fatty infiltration of the liver. Moderately distended gallbladder with small amount of sludge. Findings are similar to prior study. Electronically Signed   By: Franky Crease M.D.   On: 07/31/2024 18:34   US  Venous Img Lower Unilateral Right (DVT) Result Date: 07/31/2024 CLINICAL DATA:  Localized swelling right lower leg. EXAM: RIGHT LOWER EXTREMITY VENOUS DOPPLER ULTRASOUND TECHNIQUE: Gray-scale sonography with compression, as well as color and duplex ultrasound, were performed to evaluate the deep venous system(s) from the level of the common femoral vein through the popliteal and proximal calf veins. COMPARISON:  None Available. FINDINGS: VENOUS Normal compressibility of the common femoral, superficial femoral, and popliteal veins, as well as the visualized calf veins. Visualized portions of profunda femoral vein and great saphenous vein unremarkable. No filling defects to suggest DVT on grayscale or color Doppler imaging. Doppler waveforms show normal direction of venous flow, normal respiratory plasticity and response to augmentation. Limited views of the contralateral common femoral vein are unremarkable. OTHER None. Limitations: none IMPRESSION: Negative. Electronically Signed   By: Franky Crease M.D.   On: 07/31/2024 18:33   CT HEAD WO CONTRAST Result Date: 07/31/2024 EXAM: CT HEAD  AND CERVICAL SPINE 07/31/2024 02:32:52 PM TECHNIQUE: CT of the head and cervical spine was performed without the administration of intravenous  contrast. Multiplanar reformatted images are provided for review. Automated exposure control, iterative reconstruction, and/or weight based adjustment of the mA/kV was utilized to reduce the radiation dose to as low as reasonably achievable. COMPARISON: CT head 08/15/2023 CLINICAL HISTORY: Fall. Patient had syncopal episode witnessed by husband. Patient reports started to feel dizzy and everything went black. FINDINGS: CT HEAD BRAIN AND VENTRICLES: There is no evidence of an acute infarct, intracranial hemorrhage, mass, midline shift, hydrocephalus, or extra-axial fluid collection. Cerebral volume is within normal limits for age. The ventricles are normal in size. Cerebral white matter hypodensities are similar to the prior CT and are nonspecific but compatible with mild chronic small vessel ischemic disease. Small chronic cerebellar infarcts are unchanged. ORBITS: No acute abnormality. SINUSES AND MASTOIDS: No acute abnormality. SOFT TISSUES AND SKULL: No acute skull fracture. No suspicious bone lesion. Unchanged exostosis along the inner table of the right frontal skull. No acute soft tissue abnormality. CT CERVICAL SPINE BONES AND ALIGNMENT: Cervical spine straightening. No significant listhesis. No acute fracture is identified within limitations of motion artifact. No destructive process. DEGENERATIVE CHANGES: Mild cervical disc degeneration. Widespread cervical facet arthrosis, asymmetrically advanced on the right. Advanced multilevel neural foraminal stenosis. SOFT TISSUES: No prevertebral soft tissue swelling. IMPRESSION: 1. No acute intracranial abnormality. 2. No acute fracture or traumatic malalignment of the cervical spine. Evaluation limited by mild motion artifact. 3. Advanced cervical facet arthrosis and multilevel neural foraminal stenosis. Electronically  signed by: Dasie Hamburg MD 07/31/2024 02:41 PM EDT RP Workstation: [id number]   CT Cervical Spine Wo Contrast Result Date: 07/31/2024 EXAM: CT HEAD AND CERVICAL SPINE 07/31/2024 02:32:52 PM TECHNIQUE: CT of the head and cervical spine was performed without the administration of intravenous contrast. Multiplanar reformatted images are provided for review. Automated exposure control, iterative reconstruction, and/or weight based adjustment of the mA/kV was utilized to reduce the radiation dose to as low as reasonably achievable. COMPARISON: CT head 08/15/2023 CLINICAL HISTORY: Fall. Patient had syncopal episode witnessed by husband. Patient reports started to feel dizzy and everything went black. FINDINGS: CT HEAD BRAIN AND VENTRICLES: There is no evidence of an acute infarct, intracranial hemorrhage, mass, midline shift, hydrocephalus, or extra-axial fluid collection. Cerebral volume is within normal limits for age. The ventricles are normal in size. Cerebral white matter hypodensities are similar to the prior CT and are nonspecific but compatible with mild chronic small vessel ischemic disease. Small chronic cerebellar infarcts are unchanged. ORBITS: No acute abnormality. SINUSES AND MASTOIDS: No acute abnormality. SOFT TISSUES AND SKULL: No acute skull fracture. No suspicious bone lesion. Unchanged exostosis along the inner table of the right frontal skull. No acute soft tissue abnormality. CT CERVICAL SPINE BONES AND ALIGNMENT: Cervical spine straightening. No significant listhesis. No acute fracture is identified within limitations of motion artifact. No destructive process. DEGENERATIVE CHANGES: Mild cervical disc degeneration. Widespread cervical facet arthrosis, asymmetrically advanced on the right. Advanced multilevel neural foraminal stenosis. SOFT TISSUES: No prevertebral soft tissue swelling. IMPRESSION: 1. No acute intracranial abnormality. 2. No acute fracture or traumatic malalignment of the cervical  spine. Evaluation limited by mild motion artifact. 3. Advanced cervical facet arthrosis and multilevel neural foraminal stenosis. Electronically signed by: Dasie Hamburg MD 07/31/2024 02:41 PM EDT RP Workstation: [id number]    EKG: My personal interpretation of EKG shows: Sinus rhythm with premature beats. LBBB and prolonged Qtc that is unchanged.    Assessment/Plan Principal Problem:   Syncope and collapse Active Problems:   GAD (generalized anxiety disorder)   Gastroesophageal reflux disease  DDD (degenerative disc disease), lumbar   Hyperlipidemia associated with type 2 diabetes mellitus (HCC)   COPD (chronic obstructive pulmonary disease) (HCC)   CAD (coronary artery disease), native coronary artery   Type 2 diabetes mellitus with peripheral neuropathy (HCC)   Pt presented with syncope. DDx includes vasovagal vs cardiogenic vs orthostatic vs seizures. EKG ordered. Orthostatic vitals ordered and negative. Will admit for workup.  History is concerning for cardiac syncope due to hx of PVC and nature of syncope without prodrome. -Monitor on telemetery, continue cardiac meds including amiodarone .  -Echocardiogram done recently which doesn't show structural disease -Will need cardiology evaluation. Consulted cardiology, Dr. Bensimhon. Pt is established with Dr. Rolan.   Right extremity swelling: Noted on exam. Getting DVT study.  AGMA: Lactic acid checked and elevated. Unsure exact etiology of this elevation but repeated without intervention and it was down trending. Gave 500 cc bolus and repeat pending. If continues to trend down, then can discontinue trending it. Initial suspicion was that elevation may be due to seizures but that would result in rapid clearance and pt does not have hx of seizures and this episode was not typical for seizure. No signs of hypoperfusion or any other abnormality that would explain elevated lactic acidosis. She does have elevated LFTs which can explain elevated  lactic acid as that can impair clearance of lactic acid.   Chronic Problems:  GAD: continue home meds HLD: continue home meds HFrEF: euvolemic on exam. Continue on medications.  T2DM: Placed on SSI.  Elevated LFTs: Getting OP work up, has referral for GI. Suspicion is for NAFLD. Hypothyroidism: continue home synthroid     VTE prophylaxis:  Lovenox  Diet: HH diet Code Status:  Full Code Telemetry:  Admission status: Inpatient, Telemetry bed Patient is from: Home  Anticipated d/c is to: Home  Anticipated d/c is in: 2-3 day    Family Communication: Updated at bedside   Consults called: None   Severity of Illness: The appropriate patient status for this patient is INPATIENT. Inpatient status is judged to be reasonable and necessary in order to provide the required intensity of service to ensure the patient's safety. The patient's presenting symptoms, physical exam findings, and initial radiographic and laboratory data in the context of their chronic comorbidities is felt to place them at high risk for further clinical deterioration. Furthermore, it is not anticipated that the patient will be medically stable for discharge from the hospital within 2 midnights of admission.   * I certify that at the point of admission it is my clinical judgment that the patient will require inpatient hospital care spanning beyond 2 midnights from the point of admission due to high intensity of service, high risk for further deterioration and high frequency of surveillance required.DEWAINE Morene Bathe, MD [place] DEL. [facility]

## 2024-07-31 NOTE — ED Notes (Addendum)
 Per Dr. Fernand, repeat lactic acid test now and then administer fluids.

## 2024-07-31 NOTE — ED Triage Notes (Signed)
 Pt to ED via [facility] from [facility]. Pt had syncopal episode witnessed by husband. Pt reports started to feel dizzy and everything went black. Pt had echo on Monday due to ongoing heart issues. No blood thinner.   289 CBG  114/53 89 HR  97% RA

## 2024-07-31 NOTE — ED Notes (Signed)
 Pt has fall risk bracelet on and hospital socks on with call bell by bedside.

## 2024-07-31 NOTE — ED Provider Notes (Signed)
 [facility] Provider Note    Event Date/Time   First MD Initiated Contact with Patient 07/31/24 1407     (approximate)   History   Loss of Consciousness   HPI  Kim Buchanan is a 73 year old female with history of CHF, T2DM presenting to the emergency department for evaluation following a syncopal episode.  Patient was in the grocery store walking around when had onset of dizziness with associated shortness of breath.  Tried to stabilize herself on a refrigerator door but ended up falling and hitting the back of her head.  Reports she has experienced lightheadedness previously, but has never previously had a syncopal episode.  Denies chest pain.  Reviewed cardiology visit from 07/21/2024.  At that time patient was seen for CHF, CAD, PVCs.  Continued on her CHF medications and amiodarone  for her PVCs.         Physical Exam   Triage Vital Signs: ED Triage Vitals  Encounter Vitals Group     BP 07/31/24 1350 (!) 98/51     Girls Systolic BP Percentile --      Girls Diastolic BP Percentile --      Boys Systolic BP Percentile --      Boys Diastolic BP Percentile --      Pulse Rate 07/31/24 1350 93     Resp 07/31/24 1350 18     Temp 07/31/24 1350 98.3 F (36.8 C)     Temp Source 07/31/24 1350 Oral     SpO2 07/31/24 1350 98 %     Weight --      Height --      Head Circumference --      Peak Flow --      Pain Score 07/31/24 1351 4     Pain Loc --      Pain Education --      Exclude from Growth Chart --     Most recent vital signs: Vitals:   07/31/24 1350  BP: (!) 98/51  Pulse: 93  Resp: 18  Temp: 98.3 F (36.8 C)  SpO2: 98%     General: Awake, interactive  CV:  Good peripheral perfusion, regular rate Resp:  Unlabored respirations, lungs clear to auscultation Abd:  Nondistended.  Neuro:  Symmetric facial movement, fluid speech   ED Results / Procedures / Treatments   Labs (all labs ordered are listed, but only abnormal results are  displayed) Labs Reviewed  COMPREHENSIVE METABOLIC PANEL WITH GFR - Abnormal; Notable for the following components:      Result Value   Glucose, Bld 245 (*)    AST 96 (*)    ALT 84 (*)    Total Bilirubin 1.4 (*)    Anion gap 18 (*)    All other components within normal limits  CBC - Abnormal; Notable for the following components:   WBC 3.1 (*)    RDW 16.6 (*)    Platelets 134 (*)    All other components within normal limits  CBG MONITORING, ED - Abnormal; Notable for the following components:   Glucose-Capillary 265 (*)    All other components within normal limits  URINALYSIS, ROUTINE W REFLEX MICROSCOPIC  TROPONIN I (HIGH SENSITIVITY)  TROPONIN I (HIGH SENSITIVITY)     EKG EKG independently reviewed and interpreted by myself demonstrates:  EKG demonstrates sinus rhythm at a rate of 90, PR 182, QRS 130, QTc 543, left bundle branch block noted without appreciable superimposed ischemic changes  RADIOLOGY Imaging independently  reviewed and interpreted by myself demonstrates:  CT head without acute bleed CT C-spine without acute fracture  Formal Radiology Read:  CT HEAD WO CONTRAST Result Date: 07/31/2024 EXAM: CT HEAD AND CERVICAL SPINE 07/31/2024 02:32:52 PM TECHNIQUE: CT of the head and cervical spine was performed without the administration of intravenous contrast. Multiplanar reformatted images are provided for review. Automated exposure control, iterative reconstruction, and/or weight based adjustment of the mA/kV was utilized to reduce the radiation dose to as low as reasonably achievable. COMPARISON: CT head 08/15/2023 CLINICAL HISTORY: Fall. Patient had syncopal episode witnessed by husband. Patient reports started to feel dizzy and everything went black. FINDINGS: CT HEAD BRAIN AND VENTRICLES: There is no evidence of an acute infarct, intracranial hemorrhage, mass, midline shift, hydrocephalus, or extra-axial fluid collection. Cerebral volume is within normal limits for age.  The ventricles are normal in size. Cerebral white matter hypodensities are similar to the prior CT and are nonspecific but compatible with mild chronic small vessel ischemic disease. Small chronic cerebellar infarcts are unchanged. ORBITS: No acute abnormality. SINUSES AND MASTOIDS: No acute abnormality. SOFT TISSUES AND SKULL: No acute skull fracture. No suspicious bone lesion. Unchanged exostosis along the inner table of the right frontal skull. No acute soft tissue abnormality. CT CERVICAL SPINE BONES AND ALIGNMENT: Cervical spine straightening. No significant listhesis. No acute fracture is identified within limitations of motion artifact. No destructive process. DEGENERATIVE CHANGES: Mild cervical disc degeneration. Widespread cervical facet arthrosis, asymmetrically advanced on the right. Advanced multilevel neural foraminal stenosis. SOFT TISSUES: No prevertebral soft tissue swelling. IMPRESSION: 1. No acute intracranial abnormality. 2. No acute fracture or traumatic malalignment of the cervical spine. Evaluation limited by mild motion artifact. 3. Advanced cervical facet arthrosis and multilevel neural foraminal stenosis. Electronically signed by: Dasie Hamburg MD 07/31/2024 02:41 PM EDT RP Workstation: [id number]   CT Cervical Spine Wo Contrast Result Date: 07/31/2024 EXAM: CT HEAD AND CERVICAL SPINE 07/31/2024 02:32:52 PM TECHNIQUE: CT of the head and cervical spine was performed without the administration of intravenous contrast. Multiplanar reformatted images are provided for review. Automated exposure control, iterative reconstruction, and/or weight based adjustment of the mA/kV was utilized to reduce the radiation dose to as low as reasonably achievable. COMPARISON: CT head 08/15/2023 CLINICAL HISTORY: Fall. Patient had syncopal episode witnessed by husband. Patient reports started to feel dizzy and everything went black. FINDINGS: CT HEAD BRAIN AND VENTRICLES: There is no evidence of an acute  infarct, intracranial hemorrhage, mass, midline shift, hydrocephalus, or extra-axial fluid collection. Cerebral volume is within normal limits for age. The ventricles are normal in size. Cerebral white matter hypodensities are similar to the prior CT and are nonspecific but compatible with mild chronic small vessel ischemic disease. Small chronic cerebellar infarcts are unchanged. ORBITS: No acute abnormality. SINUSES AND MASTOIDS: No acute abnormality. SOFT TISSUES AND SKULL: No acute skull fracture. No suspicious bone lesion. Unchanged exostosis along the inner table of the right frontal skull. No acute soft tissue abnormality. CT CERVICAL SPINE BONES AND ALIGNMENT: Cervical spine straightening. No significant listhesis. No acute fracture is identified within limitations of motion artifact. No destructive process. DEGENERATIVE CHANGES: Mild cervical disc degeneration. Widespread cervical facet arthrosis, asymmetrically advanced on the right. Advanced multilevel neural foraminal stenosis. SOFT TISSUES: No prevertebral soft tissue swelling. IMPRESSION: 1. No acute intracranial abnormality. 2. No acute fracture or traumatic malalignment of the cervical spine. Evaluation limited by mild motion artifact. 3. Advanced cervical facet arthrosis and multilevel neural foraminal stenosis. Electronically signed by: Dasie Hamburg MD  07/31/2024 02:41 PM EDT RP Workstation: [id number]    PROCEDURES:  Critical Care performed: No  Procedures   MEDICATIONS ORDERED IN ED: Medications - No data to display   IMPRESSION / MDM / ASSESSMENT AND PLAN / ED COURSE  I reviewed the triage vital signs and the nursing notes.  Differential diagnosis includes, but is not limited to arrhythymia, vasovagal syncope, dehydration, anemia, electrolyte abnormality  Patient's presentation is most consistent with acute presentation with potential threat to life or bodily function.  Presents following a syncopal episode.  CBC, CMP overall  reassuring.  Negative initial troponin but drawn shortly  episode.  CT head and C-spine reassuring.  No other appreciable traumatic injuries on exam.  Here, low normal blood pressure.  Is followed as an outpatient, but given her new syncopal episode with history of CHF, left bundle branch block on EKG here, history of CAD, I am concerned about possible cardiac syncope.  Do she is appropriate for admission.  Will reach out to hospitalist team.  Clinical Course as of 07/31/24 1557  Fri Jul 31, 2024  1507 Received signout [ ]  Repeat troponin, just echo. Witnessed syncope. Little bump [HD]  1557 Case discussed with hospitalist team.  They will evaluate for anticipated admission. [NR]    Clinical Course User Index [HD] Nicholaus Rolland BRAVO, MD [NR] Levander Slate, MD     FINAL CLINICAL IMPRESSION(S) / ED DIAGNOSES   Final diagnoses:  Syncope and collapse     Rx / DC Orders   ED Discharge Orders     None        Note:  This document was prepared using Dragon voice recognition software and may include unintentional dictation errors.   Levander Slate, MD 07/31/24 [phone]

## 2024-08-01 DIAGNOSIS — I25118 Atherosclerotic heart disease of native coronary artery with other forms of angina pectoris: Secondary | ICD-10-CM

## 2024-08-01 DIAGNOSIS — R55 Syncope and collapse: Secondary | ICD-10-CM | POA: Diagnosis not present

## 2024-08-01 DIAGNOSIS — I5022 Chronic systolic (congestive) heart failure: Secondary | ICD-10-CM

## 2024-08-01 LAB — BASIC METABOLIC PANEL WITH GFR
Anion gap: 10 (ref 5–15)
BUN: 16 mg/dL (ref 8–23)
CO2: 26 mmol/L (ref 22–32)
Calcium: 8.5 mg/dL — ABNORMAL LOW (ref 8.9–10.3)
Chloride: 104 mmol/L (ref 98–111)
Creatinine, Ser: 0.78 mg/dL (ref 0.44–1.00)
GFR, Estimated: >60 mL/min (ref >60)
Glucose, Bld: 218 mg/dL — ABNORMAL HIGH (ref 70–99)
Potassium: 3.7 mmol/L (ref 3.5–5.1)
Sodium: 140 mmol/L (ref 135–145)

## 2024-08-01 LAB — CBC
HCT: 33.5 % — ABNORMAL LOW (ref 36.0–46.0)
Hemoglobin: 10.4 g/dL — ABNORMAL LOW (ref 12.0–15.0)
MCH: 27.7 pg (ref 26.0–34.0)
MCHC: 31 g/dL (ref 30.0–36.0)
MCV: 89.3 fL (ref 80.0–100.0)
Platelets: 139 K/uL — ABNORMAL LOW (ref 150–400)
RBC: 3.75 MIL/uL — ABNORMAL LOW (ref 3.87–5.11)
RDW: 16.4 % — ABNORMAL HIGH (ref 11.5–15.5)
WBC: 3.5 K/uL — ABNORMAL LOW (ref 4.0–10.5)
nRBC: 0 % (ref 0.0–0.2)

## 2024-08-01 LAB — VITAMIN D 25 HYDROXY (VIT D DEFICIENCY, FRACTURES): Vit D, 25-Hydroxy: 17.72 ng/mL — ABNORMAL LOW (ref 30–100)

## 2024-08-01 LAB — VITAMIN B12: Vitamin B-12: 160 pg/mL — ABNORMAL LOW (ref 180–914)

## 2024-08-01 LAB — CBG MONITORING, ED
Glucose-Capillary: 192 mg/dL — ABNORMAL HIGH (ref 70–99)
Glucose-Capillary: 264 mg/dL — ABNORMAL HIGH (ref 70–99)

## 2024-08-01 NOTE — Consult Note (Signed)
 Cardiology Consultation:  Patient ID: MAGDELENE RUARK MRN: [id number]; DOB: [birth year]  Admit date: 07/31/2024 Date of Consult: 08/01/2024  Primary Care Provider: Melvin Pao, NP Primary Cardiologist: Evalene Lunger, MD  Primary Electrophysiologist:  OLE ONEIDA HOLTS, MD   Patient Profile:  Kim Buchanan is a 73 y.o. female with a hx of nonischemic cardiomyopathy with recovered ejection fraction, nonobstructive CAD, diabetes, COPD, PVCs who is being seen today for the evaluation of syncope at the request of Elvan Sor, MD.  History of Present Illness:  Ms. Jacobowitz presents with a syncopal episode that occurred at [employer] yesterday.  She tells me around 9 AM she was at [employer] with her husband.  They were shopping.  She apparently was walking in the store became dizzy and felt faint and passed out.  She tells me she had nothing to eat or drink that morning.  She took all of her blood pressure medications.  She reports no chest pains or trouble breathing prior to the episode.  No rapid heartbeat.  She then arrived to [facility] with hypotension blood pressure 98/51.  Pulse in the 90s.  EKG showed sinus rhythm with PVCs which are chronic.  Labs notable for serum creatinine 0.77.  Troponin 4 and 8 on repeat.  Hemoglobin 12.1.  She did have a lactic acidosis trending down with fluids.  She reports no worsening heart failure.  No chest pains or trouble breathing.  No seizure-like activity reported.  Chest x-ray normal.  CT head without acute abnormality.  Liver enzymes minimally elevated.  Ultrasound shows fatty liver.  She tells me she feels better.  No signs of volume overload.  Past Medical History: Past Medical History:  Diagnosis Date   Anxiety    Arthritis    lower back   COPD (chronic obstructive pulmonary disease) (HCC)    Depression    Diabetes mellitus without complication (HCC)    GERD (gastroesophageal reflux disease)    Hyperlipidemia     Morbid obesity (HCC)    NICM (nonischemic cardiomyopathy) (HCC)    a. 03/2019 Echo: Nl LVEF; b. 09/04/2019 Echo: EF 25-30%; c. 09/11/2019 Cath: nonobs LAD/RCA dzs. EF 50-55%; c. 12/2020 Echo: EF 30-35%, glob HK. Mild LVH. Nl RVSP. Mild MR; d. 02/2021 RHC: Elev RH pressures. CO/CI 2.8/1.3; e. 06/2021 cMRI: EF 40%, marked sept-lat dyssynch & septal HK. Small, discrete areas of mid-wall LGE in apical lateral and apical septal walls - ? h/o myocarditis.   Nonobstructive CAD (coronary artery disease)    a. 03/2019 [facility]): EF 63%, ant ischemia; b. 08/2019 Cath: LM nl, LAD 58m, RCA 70p (iFR nl @ 0.97), 50p. EF 50-55%-->Med Rx; c. 06/2020 Cor CTA: Nl FFRs throughout cor tree.   Osteoporosis    Panic attack    PTSD (post-traumatic stress disorder)    PVC's (premature ventricular contractions)    a. 05/2021 Zio: 20.6% PVC burden. 13.4 secs of NSVT-->amio added; b. 06/2021 Zio: 5.8% PVC burden. Rare supraventrciular ectopy.   Rectal fistula    Sleep apnea    a. Prev wore CPAP but lost insurance and hasn't been using since (now has insurance).   Wears dentures    full upper    Past Surgical History: Past Surgical History:  Procedure Laterality Date   CARDIAC CATHETERIZATION     COLONOSCOPY  2014   COLONOSCOPY WITH PROPOFOL  N/A 10/01/2016   Procedure: COLONOSCOPY WITH PROPOFOL ;  Surgeon: Rogelia Copping, MD;  Location: [facility];  Service: Endoscopy;  Laterality: N/A;   COLONOSCOPY WITH PROPOFOL  N/A 01/04/2022   Procedure: COLONOSCOPY WITH PROPOFOL ;  Surgeon: Jinny Carmine, MD;  Location: [facility];  Service: Endoscopy;  Laterality: N/A;   CORONARY PRESSURE/FFR WITH 3D MAPPING N/A 09/11/2019   Procedure: Coronary Pressure Wire/FFR w/3D Mapping;  Surgeon: Mady Bruckner, MD;  Location: [facility];  Service: Cardiovascular;  Laterality: N/A;   CYSTOCELE REPAIR N/A 12/17/2016   Procedure: ANTERIOR REPAIR (CYSTOCELE);  Surgeon: Gladis DELENA Dollar, MD;  Location: [facility] ORS;  Service:  Gynecology;  Laterality: N/A;   ESOPHAGOGASTRODUODENOSCOPY (EGD) WITH PROPOFOL  N/A 08/11/2019   Procedure: ESOPHAGOGASTRODUODENOSCOPY (EGD) WITH PROPOFOL ;  Surgeon: Jinny Carmine, MD;  Location: [facility] ENDOSCOPY;  Service: Endoscopy;  Laterality: N/A;   POLYPECTOMY  10/01/2016   Procedure: POLYPECTOMY;  Surgeon: Carmine Jinny, MD;  Location: [facility];  Service: Endoscopy;;   RECTAL SURGERY  06/28/2015   Rectal prolapse, laparoscopic rectopexy the coldCharmont, MD; [facility]   RIGHT HEART CATH N/A 03/21/2021   Procedure: RIGHT HEART CATH;  Surgeon: Mady Bruckner, MD;  Location: [facility];  Service: Cardiovascular;  Laterality: N/A;   RIGHT/LEFT HEART CATH AND CORONARY ANGIOGRAPHY Bilateral 09/11/2019   Procedure: RIGHT/LEFT HEART CATH AND CORONARY ANGIOGRAPHY;  Surgeon: Perla Evalene PARAS, MD;  Location: [facility];  Service: Cardiovascular;  Laterality: Bilateral;   TOTAL ABDOMINAL HYSTERECTOMY W/ BILATERAL SALPINGOOPHORECTOMY     TUBAL LIGATION     VAGINAL HYSTERECTOMY Bilateral 12/17/2016   Procedure: HYSTERECTOMY VAGINAL WITH BILATERAL SALPINGO OOPHERECTOMY;  Surgeon: Gladis DELENA Dollar, MD;  Location: [facility];  Service: Gynecology;  Laterality: Bilateral;     Allergies:    Allergies  Allergen Reactions   Entresto  [Sacubitril-Valsartan ]     Cough    Lisinopril Cough    Social History:   Social History   Socioeconomic History   Marital status: Married    Spouse name: Solicitor   Number of children: 1   Years of education: Not on file   Highest education level: 9th grade  Occupational History    Employer: DISABLED    Comment: for panic attacks  Tobacco Use   Smoking status: Former    Current packs/day: 0.00    Average packs/day: 1.5 packs/day for 50.0 years (75.0 ttl pk-yrs)    Types: Cigarettes    Start date: 11/29/1968    Quit date: 11/29/2018    Years since quitting: 5.6   Smokeless tobacco: Never   Tobacco comments:    started at age 41  Vaping  Use   Vaping status: Never Used  Substance and Sexual Activity   Alcohol use: No    Alcohol/week: 0.0 standard drinks of alcohol   Drug use: No   Sexual activity: Not Currently  Other Topics Concern   Not on file  Social History Narrative   Pt had one son, who died when was 63 YO in a MVA.   Social Drivers of Corporate investment banker Strain: Low Risk  (01/02/2024)   Overall Financial Resource Strain (CARDIA)    Difficulty of Paying Living Expenses: Not hard at all  Food Insecurity: No Food Insecurity (06/11/2024)   Hunger Vital Sign    Worried About Running Out of Food in the Last Year: Never true    Ran Out of Food in the Last Year: Never true  Transportation Needs: No Transportation Needs (06/11/2024)   PRAPARE - Administrator, Civil Service (Medical): No    Lack of Transportation (Non-Medical): No  Physical  Activity: Inactive (01/02/2024)   Exercise Vital Sign    Days of Exercise per Week: 0 days    Minutes of Exercise per Session: 0 min  Stress: No Stress Concern Present (01/02/2024)   [employer] of Occupational Health - Occupational Stress Questionnaire    Feeling of Stress : Not at all  Social Connections: Socially Isolated (01/02/2024)   Social Connection and Isolation Panel    Frequency of Communication with Friends and Family: Once a week    Frequency of Social Gatherings with Friends and Family: Once a week    Attends Religious Services: Never    Database administrator or Organizations: No    Attends Banker Meetings: Never    Marital Status: Married  Catering manager Violence: Not At Risk (06/11/2024)   Humiliation, Afraid, Rape, and Kick questionnaire    Fear of Current or Ex-Partner: No    Emotionally Abused: No    Physically Abused: No    Sexually Abused: No     Family History:    Family History  Problem Relation Age of Onset   Diabetes Mother    Diabetes Sister    Cancer Sister    Diabetes Brother    Lymphoma Brother     Heart disease Father    Hypertension Father    Diabetes Sister    Diabetes Brother    Lymphoma Brother    Heart disease Brother    Healthy Sister    Breast cancer Neg Hx    Ovarian cancer Neg Hx    Colon cancer Neg Hx      ROS:  All other ROS reviewed and negative. Pertinent positives noted in the HPI.     Physical Exam/Data:   Vitals:   08/01/24 0427 08/01/24 0600 08/01/24 0837 08/01/24 0932  BP: (!) 111/44 (!) 108/51  111/69  Pulse: 77 81  80  Resp: 19 (!) 21  20  Temp: 98.1 F (36.7 C)  98.1 F (36.7 C)   TempSrc: Oral  Oral Oral  SpO2: 93% 96%  99%    Intake/Output Summary (Last 24 hours) at 08/01/2024 1205 Last data filed at 07/31/2024 1926 Gross per 24 hour  Intake 500.3 ml  Output --  Net 500.3 ml       07/21/2024    9:34 AM 07/07/2024    8:52 AM 06/30/2024    9:23 AM  Last 3 Weights  Weight (lbs) 223 lb 9.6 oz 222 lb 12.8 oz 224 lb 2 oz  Weight (kg) 101.424 kg 101.061 kg 101.662 kg    There is no height or weight on file to calculate BMI.  General: Well nourished, well developed, in no acute distress Head: Atraumatic, normal size  Eyes: PEERLA, EOMI  Neck: Supple, no JVD Endocrine: No thryomegaly Cardiac: Normal S1, S2; RRR; no murmurs, rubs, or gallops Lungs: Clear to auscultation bilaterally, no wheezing, rhonchi or rales  Abd: Soft, nontender, no hepatomegaly  Ext: No edema, pulses 2+ Musculoskeletal: No deformities, BUE and BLE strength normal and equal Skin: Warm and dry, no rashes   Neuro: Alert and oriented to person, place, time, and situation, CNII-XII grossly intact, no focal deficits  Psych: Normal mood and affect   EKG:  The EKG was personally reviewed and demonstrates: Sinus rhythm heart rate 90, left bundle branch block Telemetry:  Telemetry was personally reviewed and demonstrates: Sinus rhythm 90, PVCs  Relevant CV Studies: TTE 07/27/2024  1. Left ventricular ejection fraction, by estimation, is 50 to 55%.  The  left ventricle has  low normal function. The left ventricle has no regional  wall motion abnormalities. There is mild left ventricular hypertrophy.  Left ventricular diastolic  parameters are consistent with Grade I diastolic dysfunction (impaired  relaxation).   2. Right ventricular systolic function is normal. The right ventricular  size is normal. Tricuspid regurgitation signal is inadequate for assessing  PA pressure.   3. The mitral valve is normal in structure. No evidence of mitral valve  regurgitation. No evidence of mitral stenosis.   4. The aortic valve is normal in structure. Aortic valve regurgitation is  not visualized. No aortic stenosis is present.   CCTA 06/30/2020 IMPRESSION: 1. Coronary calcium  score of 39.3. This was 67 percentile for age and sex matched control.   2. Normal coronary origin with right dominance.   3. Moderate (50-69%) soft plaque stenosis in the mid LAD; mild (25-49%) soft and mixed plaque stenoses in the proximal to mid RCA.   4. Study will be sent for FFR.  Assessment and Plan:   # Syncope, likely vasovagal triggered by dehydration and BP medications  - She suffered a syncopal episode while at the grocery store.  She did not eat or drink anything that morning.  She took her blood pressure medications.  She reports no chest pain or trouble breathing prior to the episode.  She has no signs of decompensated heart failure. - EKG is negative for acute coronary syndrome.  History of nonobstructive CAD anyway. - Do not suspect arrhythmia.  - Neurological workup is unremarkable. - Strongly suspect this was a vagal episode in the setting of dehydration and taking her blood pressure medications.  Everything workup wise has been negative here. - She just had an echo last week that showed normal LV function.  I do not think this needs to be repeated. - Would recommend he reduce her Lasix  to as needed.  We discussed the importance of eating and drinking when taking her  medications.  We will also stop her Aldactone . - She can continue her other medications. - We will set her up for a 2-week ZIO. - We discussed no driving until she completes her monitor and is seen in the office.  I suspect this is just a vasovagal episode so if she can go 4 weeks without an episode I think that would be fine to return to driving.  She does inform me that she really does not drive anyway.  # Chronic systolic heart failure with recovery of ejection fraction - Euvolemic on exam. EF 50-55% - Would recommend he reduce her Lasix  to as needed.  Stop Aldactone . - She can continue home valsartan  and metoprolol   # PVCs - Okay to continue home metoprolol .  # Nonobstructive CAD - Aspirin  statin.  # Lactic acidosis # Hypotension - Related to dehydration and blood pressure medication.  Would recommend to recheck a lactic acid as long as this is normal she can likely be discharged.  # Elevated liver enzymes - Fatty liver  # DM - Home meds  [facility] will sign off.   The patient is ready for discharge today from a cardiac standpoint. Medication Recommendations:  As above Other recommendations (labs, testing, etc):  2 week zio will be mailed to her Follow up as an outpatient:  Keep Appointment with Dr. Gollan 08/31/2024  For questions or updates, please contact [facility] Please consult [email or web address] for contact info under     Signed, Darryle T. Barbaraann, MD,  [facility]  [facility]  08/01/2024 12:05 PM

## 2024-08-01 NOTE — Care Management CC44 (Signed)
 Condition Code 44 Documentation Completed  Patient Details  Name: MURLENE REVELL MRN: [id number] Date of Birth: 1950-10-30   Condition Code 44 given:  Yes Patient signature on Condition Code 44 notice:  Yes Documentation of 2 MD's agreement:  Yes Code 44 added to claim:   (Waiting for UR nurse to assist with putting in the claim.)    K'[place] J Alverto Shedd, LCSW 08/01/2024, 2:06 PM

## 2024-08-01 NOTE — Evaluation (Signed)
 Occupational Therapy Evaluation Patient Details Name: Kim Buchanan MRN: [id number] DOB: 25-Nov-1950 Today's Date: 08/01/2024   History of Present Illness   Kim Buchanan is a 73 y.o. year old female with past medical history of PVCs, hypertension, hyperlipidemia, type 2 diabetes, heart failure, and COPD presenting to the ED after a syncopal episode.     Clinical Impressions Ms. Mester was seen for OT tx this date. Pt reports she is generally independent with all ADL/IADL management at baseline. Has access to some AE including a RW and BSC at home but does not use these items. She denies additional falls history in the last 6 months. Pt reports feeling at or near baseline level of functional independence. She maintains spO2 of 96% or greater on 2 L [place] during session. She performs simulated LB dressing task with good LB access and no LOB with sitting weight shift. Able to perform functional mobility in room without a device. Per nsg, has been getting up independently to visit hallway bathroom in the ED. Educated on energy conservation strategies to maximize safety and functional independence upon DC. Pt return verbalizes understanding. No further skilled OT needs identified. Will sign off in house. Please re-consult if additional OT needs arise during this hospital stay.      If plan is discharge home, recommend the following:   Assist for transportation;Assistance with cooking/housework     Functional Status Assessment   Patient has not had a recent decline in their functional status     Equipment Recommendations   None recommended by OT     Recommendations for Other Services         Precautions/Restrictions   Precautions Precautions: Fall Restrictions Weight Bearing Restrictions Per Provider Order: No     Mobility Bed Mobility Overal bed mobility: Independent                  Transfers Overall transfer level: Independent                  General transfer comment: SBA for mgt of lines/leads but no physical assist required.      Balance Overall balance assessment: No apparent balance deficits (not formally assessed)                                         ADL either performed or assessed with clinical judgement   ADL Overall ADL's : At baseline                                       General ADL Comments: Pt reports feeling at or near baseline level of functional independence. She maintains spO2 of 96% or greater on 2 L [place] during session. She performs simulated LB dressing task with good LB access and no LOB with sitting weight shift. Able to perform functional mobility in room without a device. Per nsg, has been getting up independently to visit hallway bathroom in the ED.     Vision Baseline Vision/History: 1 Wears glasses Ability to See in Adequate Light: 1 Impaired Patient Visual Report: No change from baseline       Perception         Praxis         Pertinent Vitals/Pain Pain Assessment Pain Assessment: No/denies pain  Extremity/Trunk Assessment Upper Extremity Assessment Upper Extremity Assessment: Overall WFL for tasks assessed   Lower Extremity Assessment Lower Extremity Assessment: Overall WFL for tasks assessed   Cervical / Trunk Assessment Cervical / Trunk Assessment: Normal   Communication Communication Communication: No apparent difficulties   Cognition Arousal: Alert Behavior During Therapy: WFL for tasks assessed/performed Cognition: No apparent impairments             OT - Cognition Comments: A&O x4, pleasant, conversational                 Following commands: Intact       Cueing  General Comments   Cueing Techniques: Verbal cues      Exercises Other Exercises Other Exercises: Pt educated on role of OT in acute setting, energy conservation strategies including activity pacing and pursed lipped breathing to maximize safety  and functional independence during ADL management. Verbalizes understanding of all education provided.   Shoulder Instructions      Home Living Family/patient expects to be discharged to:: Private residence Living Arrangements: Spouse/significant other Available Help at Discharge: Family;Available 24 hours/day Type of Home: Mobile home Home Access: Stairs to enter Entrance Stairs-Number of Steps: 4 Entrance Stairs-Rails: Right;Left Home Layout: One level     Bathroom Shower/Tub: Chief Strategy Officer: Handicapped height     Home Equipment: Agricultural consultant (2 wheels);Cane - single point;BSC/3in1;Shower seat          Prior Functioning/Environment Prior Level of Function : Independent/Modified Independent;Driving             Mobility Comments: Independent, no AE. Denies additional falls in last year. ADLs Comments: Independent, driving, shopping, cooking meals.    OT Problem List: Decreased safety awareness;Impaired balance (sitting and/or standing);Decreased knowledge of use of DME or AE   OT Treatment/Interventions:        OT Goals(Current goals can be found in the care plan section)   Acute Rehab OT Goals Patient Stated Goal: to go home OT Goal Formulation: All assessment and education complete, DC therapy Time For Goal Achievement: 08/01/24 Potential to Achieve Goals: Good   OT Frequency:       Co-evaluation              AM-PAC OT 6 Clicks Daily Activity     Outcome Measure Help from another person eating meals?: None Help from another person taking care of personal grooming?: None Help from another person toileting, which includes using toliet, bedpan, or urinal?: None Help from another person bathing (including washing, rinsing, drying)?: None Help from another person to put on and taking off regular upper body clothing?: None Help from another person to put on and taking off regular lower body clothing?: None 6 Click Score: 24    End of Session Equipment Utilized During Treatment: Gait belt Nurse Communication: Mobility status  Activity Tolerance: Patient tolerated treatment well Patient left: in chair;with call bell/phone within reach  OT Visit Diagnosis: Other abnormalities of gait and mobility (R26.89);History of falling (Z91.81)                Time: 9198-9181 OT Time Calculation (min): 17 min Charges:  OT General Charges $OT Visit: 1 Visit OT Evaluation $OT Eval Moderate Complexity: 1 Mod  Romey Cohea Shasta, M.S., OTR/L 08/01/24, 11:28 AM

## 2024-08-01 NOTE — Care Management Obs Status (Signed)
 MEDICARE OBSERVATION STATUS NOTIFICATION   Patient Details  Name: Kim Buchanan MRN: [id number] Date of Birth: [birth year]   Medicare Observation Status Notification Given:  Yes    Corrie JINNY Ruts, LCSW 08/01/2024, 2:06 PM

## 2024-08-01 NOTE — ED Notes (Signed)
 Pt ripped out her iv and stated she was leaving . She is tired of waiting.

## 2024-08-01 NOTE — Discharge Summary (Signed)
 [facility] Discharge Summary   Patient: Kim Buchanan FMW:[id number]  PCP: Melvin Pao, NP  Date of admission: 07/31/2024   Date of discharge: 08/01/2024     Discharge Diagnoses:  Principal Problem:   Syncope and collapse Active Problems:   GAD (generalized anxiety disorder)   Gastroesophageal reflux disease   DDD (degenerative disc disease), lumbar   Hyperlipidemia associated with type 2 diabetes mellitus (HCC)   COPD (chronic obstructive pulmonary disease) (HCC)   CAD (coronary artery disease), native coronary artery   Type 2 diabetes mellitus with peripheral neuropathy (HCC)   Admitted From: Home Disposition: Home  Recommendations for Outpatient Follow-up:  PCP: In 1 week Cardiology in 1 week Follow up LABS/TEST: CBC, BMP, LFTs in 1 to 2 weeks   Follow-up Information     Melvin Pao, NP Follow up in 1 week(s).   Specialty: Nurse Practitioner Contact information: [address] [place] [address] [phone]         Barbaraann Darryle Ned, MD Follow up in 2 week(s).   Specialties: Cardiology, Internal Medicine, Radiology Contact information: [address] [place] [address] [phone]                Diet recommendation: Cardiac diet  Activity: The patient is advised to gradually reintroduce usual activities, as tolerated  Discharge Condition: stable  Code Status: Full code   History of present illness: As per the H and P dictated on admission.  Hospital Course:  ANGELLINA FERDINAND is a 73 y.o. year old female with past medical history of PVCs, hypertension, hyperlipidemia, type 2 diabetes, heart failure with recovered ejection fraction (previous EF 30% but recently 55% 1 week ago), and COPD presenting to the ED after a syncopal episode.     Patient states she was shopping and felt dizzy and then passed out. States she felt dizzy and everything went dark within 5 seconds. Had LOC but never before. Had episode of  dizziness previously once but that was remote and it resolved without intervention.    Pt has DOE at baseline. Does get palpitations but none this week.    On chart review, pt has hx of PVC where zio patch was placed that showed 21 % burden but improved to 6 % on amiodarone .    ED Course: On arrival to the ED patient hemodynamically stable.  Trauma imaging negative for any acute findings.  CBC with leukopenia and thrombocytopenia, CMP with hyperglycemia, and elevated LFTs including elevated bilirubin.   [facility] contacted for admission.   Assessment/Plan   # Syncope: Most likely vasovagal, possible transient orthostatic. Patient was monitored on telemetry leads noticed. Continued amiodarone  home dose Patient was seen by cardiology, recommended Zio patch for 2 weeks.  Recommended to reduce Lasix  to as needed and discontinue Aldactone .  Cardiology advised no driving until seen in the office and no vasovagal episodes in the next 4 weeks, then she can drive but patient does not drive anyway. Patient was cleared for discharge and follow-up with not patient.   # Right extremity swelling: Resolved Noted on exam by admitting physician.  Venous duplex negative for DVT. During my exam I did not notice any significant swelling of lower legs.   # AGMA: Most likely secondary to hypoperfusion.  Blood pressure remains soft.  Lactic acid 4.5 >>3.1, NS bolus was given and lactic acid was trending down.  No signs of infection.  Clinically patient remained stable.  Patient was advised to follow with PCP to repeat labs  in 1 to 2 weeks.  Repeat BMP and LFTs in 1 to 2 weeks  Chronic Problems:  GAD: continue home meds HLD: continue home meds HFrEF: euvolemic on exam. Continue on medications.  T2DM: Placed on SSI.  Elevated LFTs: Getting OP work up, has referral for GI. Suspicion is for NAFLD. Hypothyroidism: continue home synthroid    There is no height or weight on file to calculate BMI.  Nutrition  Interventions:  - Patient was instructed, not to drive, operate heavy machinery, perform activities at heights, swimming or participation in water  activities or provide baby sitting services while on Pain, Sleep and Anxiety Medications; until her outpatient Physician has advised to do so again.  - Also recommended to not to take more than prescribed Pain, Sleep and Anxiety Medications.  Patient was ambulatory without any assistance. On the day of the discharge the patient's vitals were stable, and no other acute medical condition were reported by patient. the patient was felt safe to be discharge at Home.  Consultants: Cardiology Procedures: None  Discharge Exam: General: Appear in no distress, Oral Mucosa Clear, moist. Cardiovascular: S1 and S2 Present, no Murmur, Respiratory: normal respiratory effort, Bilateral Air entry present and no Crackles, no wheezes Abdomen: Bowel Sound present, Soft and no tenderness. Extremities: no Pedal edema, no calf tenderness Neurology: alert and oriented to time, place, and person affect appropriate.  There were no vitals filed for this visit. Vitals:   08/01/24 0837 08/01/24 0932  BP:  111/69  Pulse:  80  Resp:  20  Temp: 98.1 F (36.7 C)   SpO2:  99%    DISCHARGE MEDICATION: Allergies as of 08/01/2024       Reactions   Entresto  [sacubitril-valsartan ]    Cough   Lisinopril Cough        Medication List     STOP taking these medications    spironolactone  25 MG tablet Commonly known as: ALDACTONE    traMADol  50 MG tablet Commonly known as: ULTRAM        TAKE these medications    amiodarone  200 MG tablet Commonly known as: Pacerone  Take 0.5 tablets (100 mg total) by mouth daily.   aspirin  EC 81 MG tablet Take 1 tablet (81 mg total) by mouth daily. Swallow whole.   FreeStyle Libre 2 Plus Sensor Misc Change sensor every 15 days.   FreeStyle Libre 2 Reader [place] 1 each by Does not apply route daily.   furosemide  40 MG  tablet Commonly known as: LASIX  Take 1 tablet (40 mg total) by mouth daily as needed for fluid or edema. What changed:  when to take this reasons to take this   glimepiride  4 MG tablet Commonly known as: AMARYL  Take 4 mg by mouth daily with breakfast.   insulin  lispro 100 UNIT/ML KwikPen Commonly known as: HumaLOG  KwikPen Inject 20 Units into the skin 3 (three) times daily. With meals. What changed: how much to take   Insulin  Pen Needle 30G X 6 MM Misc 1 Application by Does not apply route daily.   levothyroxine  88 MCG tablet Commonly known as: SYNTHROID  Take 1 tablet (88 mcg total) by mouth daily.   metFORMIN  1000 MG tablet Commonly known as: GLUCOPHAGE  TAKE 1 TABLET BY MOUTH TWICE  DAILY WITH MEALS   metoprolol  succinate 25 MG 24 hr tablet Commonly known as: TOPROL -XL TAKE 1 TABLET BY MOUTH DAILY What changed:  how much to take how to take this when to take this   nitroGLYCERIN  0.3 MG SL tablet Commonly known  as: Nitrostat  Place 1 tablet (0.3 mg total) under the tongue every 5 (five) minutes as needed for chest pain.   OneTouch Delica Plus Lancet30G Misc TEST ONCE DAILY   OneTouch Ultra test strip Generic drug: glucose blood TEST ONCE DAILY   pantoprazole  40 MG tablet Commonly known as: PROTONIX  Take 1 tablet (40 mg total) by mouth daily.   polyethylene glycol powder 17 GM/SCOOP powder Commonly known as: GLYCOLAX /MIRALAX  Take 17 g by mouth daily.   QUEtiapine  200 MG tablet Commonly known as: SEROQUEL  TAKE 1 TABLET BY MOUTH DAILY AT  BEDTIME   Rybelsus 3 MG Tabs Generic drug: Semaglutide Take 3 mg by mouth daily.   Tresiba  FlexTouch 200 UNIT/ML FlexTouch Pen Generic drug: insulin  degludec Reduce from 100 units to 70 units. What changed:  how much to take how to take this when to take this additional instructions   valsartan  40 MG tablet Commonly known as: DIOVAN  Take 0.5 tablets (20 mg total) by mouth daily.       Allergies  Allergen  Reactions   Entresto  [Sacubitril-Valsartan ]     Cough    Lisinopril Cough   Discharge Instructions     Call MD for:  difficulty breathing, headache or visual disturbances   Complete by: As directed    Call MD for:  extreme fatigue   Complete by: As directed    Call MD for:  persistant dizziness or light-headedness   Complete by: As directed    Call MD for:  persistant nausea and vomiting   Complete by: As directed    Call MD for:  severe uncontrolled pain   Complete by: As directed    Call MD for:  temperature >100.4   Complete by: As directed    Diet - low sodium heart healthy   Complete by: As directed    Discharge instructions   Complete by: As directed    Follow-up with PCP in 1 week Follow-up with cardiology in 1 to 2 weeks, patient will need Zio patch to monitor for arrhythmias Discontinued Aldactone  and changed Lasix  to as needed as per cardiology Monitor BP and heart rate at home and follow with cardiology as an outpatient.   Increase activity slowly   Complete by: As directed        The results of significant diagnostics from this hospitalization (including imaging, microbiology, ancillary and laboratory) are listed below for reference.    Significant Diagnostic Studies: US  Abdomen Limited RUQ (LIVER/GB) Result Date: 07/31/2024 CLINICAL DATA:  Elevated LFTs EXAM: ULTRASOUND ABDOMEN LIMITED RIGHT UPPER QUADRANT COMPARISON:  05/21/2024 FINDINGS: Gallbladder: Distended gallbladder. Small amount of sludge within the gallbladder. No stones, wall thickening or sonographic Murphy sign. Common bile duct: Diameter: Normal caliber, 6 mm. Liver: Enlarged and echogenic compatible with fatty infiltration. No focal abnormality. Portal vein is patent on color Doppler imaging with normal direction of blood flow towards the liver. Other: None. IMPRESSION: Hepatomegaly.  Diffuse fatty infiltration of the liver. Moderately distended gallbladder with small amount of sludge. Findings are  similar to prior study. Electronically Signed   By: Franky Crease M.D.   On: 07/31/2024 18:34   US  Venous Img Lower Unilateral Right (DVT) Result Date: 07/31/2024 CLINICAL DATA:  Localized swelling right lower leg. EXAM: RIGHT LOWER EXTREMITY VENOUS DOPPLER ULTRASOUND TECHNIQUE: Gray-scale sonography with compression, as well as color and duplex ultrasound, were performed to evaluate the deep venous system(s) from the level of the common femoral vein through the popliteal and proximal calf veins. COMPARISON:  None Available. FINDINGS: VENOUS Normal compressibility of the common femoral, superficial femoral, and popliteal veins, as well as the visualized calf veins. Visualized portions of profunda femoral vein and great saphenous vein unremarkable. No filling defects to suggest DVT on grayscale or color Doppler imaging. Doppler waveforms show normal direction of venous flow, normal respiratory plasticity and response to augmentation. Limited views of the contralateral common femoral vein are unremarkable. OTHER None. Limitations: none IMPRESSION: Negative. Electronically Signed   By: Franky Crease M.D.   On: 07/31/2024 18:33   CT HEAD WO CONTRAST Result Date: 07/31/2024 EXAM: CT HEAD AND CERVICAL SPINE 07/31/2024 02:32:52 PM TECHNIQUE: CT of the head and cervical spine was performed without the administration of intravenous contrast. Multiplanar reformatted images are provided for review. Automated exposure control, iterative reconstruction, and/or weight based adjustment of the mA/kV was utilized to reduce the radiation dose to as low as reasonably achievable. COMPARISON: CT head 08/15/2023 CLINICAL HISTORY: Fall. Patient had syncopal episode witnessed by husband. Patient reports started to feel dizzy and everything went black. FINDINGS: CT HEAD BRAIN AND VENTRICLES: There is no evidence of an acute infarct, intracranial hemorrhage, mass, midline shift, hydrocephalus, or extra-axial fluid collection. Cerebral  volume is within normal limits for age. The ventricles are normal in size. Cerebral white matter hypodensities are similar to the prior CT and are nonspecific but compatible with mild chronic small vessel ischemic disease. Small chronic cerebellar infarcts are unchanged. ORBITS: No acute abnormality. SINUSES AND MASTOIDS: No acute abnormality. SOFT TISSUES AND SKULL: No acute skull fracture. No suspicious bone lesion. Unchanged exostosis along the inner table of the right frontal skull. No acute soft tissue abnormality. CT CERVICAL SPINE BONES AND ALIGNMENT: Cervical spine straightening. No significant listhesis. No acute fracture is identified within limitations of motion artifact. No destructive process. DEGENERATIVE CHANGES: Mild cervical disc degeneration. Widespread cervical facet arthrosis, asymmetrically advanced on the right. Advanced multilevel neural foraminal stenosis. SOFT TISSUES: No prevertebral soft tissue swelling. IMPRESSION: 1. No acute intracranial abnormality. 2. No acute fracture or traumatic malalignment of the cervical spine. Evaluation limited by mild motion artifact. 3. Advanced cervical facet arthrosis and multilevel neural foraminal stenosis. Electronically signed by: Dasie Hamburg MD 07/31/2024 02:41 PM EDT RP Workstation: [id number]   CT Cervical Spine Wo Contrast Result Date: 07/31/2024 EXAM: CT HEAD AND CERVICAL SPINE 07/31/2024 02:32:52 PM TECHNIQUE: CT of the head and cervical spine was performed without the administration of intravenous contrast. Multiplanar reformatted images are provided for review. Automated exposure control, iterative reconstruction, and/or weight based adjustment of the mA/kV was utilized to reduce the radiation dose to as low as reasonably achievable. COMPARISON: CT head 08/15/2023 CLINICAL HISTORY: Fall. Patient had syncopal episode witnessed by husband. Patient reports started to feel dizzy and everything went black. FINDINGS: CT HEAD BRAIN AND VENTRICLES:  There is no evidence of an acute infarct, intracranial hemorrhage, mass, midline shift, hydrocephalus, or extra-axial fluid collection. Cerebral volume is within normal limits for age. The ventricles are normal in size. Cerebral white matter hypodensities are similar to the prior CT and are nonspecific but compatible with mild chronic small vessel ischemic disease. Small chronic cerebellar infarcts are unchanged. ORBITS: No acute abnormality. SINUSES AND MASTOIDS: No acute abnormality. SOFT TISSUES AND SKULL: No acute skull fracture. No suspicious bone lesion. Unchanged exostosis along the inner table of the right frontal skull. No acute soft tissue abnormality. CT CERVICAL SPINE BONES AND ALIGNMENT: Cervical spine straightening. No significant listhesis. No acute fracture is identified within limitations of motion  artifact. No destructive process. DEGENERATIVE CHANGES: Mild cervical disc degeneration. Widespread cervical facet arthrosis, asymmetrically advanced on the right. Advanced multilevel neural foraminal stenosis. SOFT TISSUES: No prevertebral soft tissue swelling. IMPRESSION: 1. No acute intracranial abnormality. 2. No acute fracture or traumatic malalignment of the cervical spine. Evaluation limited by mild motion artifact. 3. Advanced cervical facet arthrosis and multilevel neural foraminal stenosis. Electronically signed by: Dasie Hamburg MD 07/31/2024 02:41 PM EDT RP Workstation: [id number]   ECHOCARDIOGRAM COMPLETE Result Date: 07/27/2024    ECHOCARDIOGRAM REPORT   Patient Name:   WANNETTA LANGLAND Kensinger Date of Exam: 07/27/2024 Medical Rec #:  [id number]        Height:       63.0 in Accession #:    [id number]       Weight:       223.6 lb Date of Birth:  Oct 02, 1951        BSA:          2.028 m Patient Age:    73 years         BP:           106/50 mmHg Patient Gender: F                HR:           87 bpm. Exam Location:  [facility] Procedure: 2D Echo, Cardiac Doppler, Color Doppler and Intracardiac             Opacification Agent (Both Spectral and Color Flow Doppler were            utilized during procedure). Indications:     Congestive Heart Failure I50.9  History:         Patient has prior history of Echocardiogram examinations, most                  recent 08/16/2023. CHF.  Sonographer:     Ashley McNeely-Sloane Referring Phys:  [id number] ELLOUISE DELENA CLASS Diagnosing Phys: Deatrice Cage MD IMPRESSIONS  1. Left ventricular ejection fraction, by estimation, is 50 to 55%. The left ventricle has low normal function. The left ventricle has no regional wall motion abnormalities. There is mild left ventricular hypertrophy. Left ventricular diastolic parameters are consistent with Grade I diastolic dysfunction (impaired relaxation).  2. Right ventricular systolic function is normal. The right ventricular size is normal. Tricuspid regurgitation signal is inadequate for assessing PA pressure.  3. The mitral valve is normal in structure. No evidence of mitral valve regurgitation. No evidence of mitral stenosis.  4. The aortic valve is normal in structure. Aortic valve regurgitation is not visualized. No aortic stenosis is present. FINDINGS  Left Ventricle: Left ventricular ejection fraction, by estimation, is 50 to 55%. The left ventricle has low normal function. The left ventricle has no regional wall motion abnormalities. Definity  contrast agent was given IV to delineate the left ventricular endocardial borders. The left ventricular internal cavity size was normal in size. There is mild left ventricular hypertrophy. Abnormal (paradoxical) septal motion, consistent with left bundle branch block. Left ventricular diastolic parameters are consistent with Grade I diastolic dysfunction (impaired relaxation). Right Ventricle: The right ventricular size is normal. No increase in right ventricular wall thickness. Right ventricular systolic function is normal. Tricuspid regurgitation signal is inadequate for assessing PA pressure. Left  Atrium: Left atrial size was normal in size. Right Atrium: Right atrial size was normal in size. Pericardium: There is no evidence of pericardial effusion. Mitral Valve: The mitral valve is normal in  structure. No evidence of mitral valve regurgitation. No evidence of mitral valve stenosis. MV peak gradient, 6.4 mmHg. The mean mitral valve gradient is 3.0 mmHg. Tricuspid Valve: The tricuspid valve is normal in structure. Tricuspid valve regurgitation is not demonstrated. No evidence of tricuspid stenosis. Aortic Valve: The aortic valve is normal in structure. Aortic valve regurgitation is not visualized. No aortic stenosis is present. Aortic valve mean gradient measures 5.0 mmHg. Aortic valve peak gradient measures 9.1 mmHg. Aortic valve area, by VTI measures 2.60 cm. Pulmonic Valve: The pulmonic valve was normal in structure. Pulmonic valve regurgitation is mild. No evidence of pulmonic stenosis. Aorta: The aortic root is normal in size and structure. Venous: The inferior vena cava was not well visualized. IAS/Shunts: No atrial level shunt detected by color flow Doppler.  LEFT VENTRICLE PLAX 2D LVIDd:         4.00 cm      Diastology LVIDs:         3.40 cm      LV e' medial:    5.98 cm/s LV PW:         1.10 cm      LV E/e' medial:  9.6 LV IVS:        1.30 cm      LV e' lateral:   4.90 cm/s LVOT diam:     2.00 cm      LV E/e' lateral: 11.7 LV SV:         73 LV SV Index:   36 LVOT Area:     3.14 cm  LV Volumes (MOD) LV vol d, MOD A2C: 81.3 ml LV vol d, MOD A4C: 105.0 ml LV vol s, MOD A2C: 27.1 ml LV vol s, MOD A4C: 34.3 ml LV SV MOD A2C:     54.2 ml LV SV MOD A4C:     105.0 ml LV SV MOD BP:      60.9 ml RIGHT VENTRICLE RV Basal diam:  2.40 cm RV Mid diam:    2.40 cm RV S prime:     12.70 cm/s LEFT ATRIUM           Index        RIGHT ATRIUM          Index LA diam:      3.70 cm 1.82 cm/m   RA Area:     7.57 cm LA Vol (A2C): 32.3 ml 15.93 ml/m  RA Volume:   12.30 ml 6.07 ml/m LA Vol (A4C): 47.3 ml 23.33 ml/m   AORTIC VALVE                     PULMONIC VALVE AV Area (Vmax):    2.64 cm      PV Vmax:          1.57 m/s AV Area (Vmean):   2.63 cm      PV Vmean:         104.000 cm/s AV Area (VTI):     2.60 cm      PV VTI:           0.296 m AV Vmax:           151.00 cm/s   PV Peak grad:     9.9 mmHg AV Vmean:          101.000 cm/s  PV Mean grad:     5.0 mmHg AV VTI:            0.279 m  PR End Diast Vel: 5.76 msec AV Peak Grad:      9.1 mmHg      RVOT Peak grad:   6 mmHg AV Mean Grad:      5.0 mmHg LVOT Vmax:         127.00 cm/s LVOT Vmean:        84.500 cm/s LVOT VTI:          0.231 m LVOT/AV VTI ratio: 0.83  AORTA Ao Root diam: 2.90 cm MITRAL VALVE MV Area (PHT): 4.06 cm    SHUNTS MV Area VTI:   3.78 cm    Systemic VTI:  0.23 m MV Peak grad:  6.4 mmHg    Systemic Diam: 2.00 cm MV Mean grad:  3.0 mmHg    Pulmonic VTI:  0.234 m MV Vmax:       1.26 m/s MV Vmean:      79.8 cm/s MV Decel Time: 187 msec MV E velocity: 57.20 cm/s MV A velocity: 87.40 cm/s MV E/A ratio:  0.65 Deatrice Cage MD Electronically signed by Deatrice Cage MD Signature Date/Time: 07/27/2024/2:53:04 PM    Final     Microbiology: No results found for this or any previous visit (from the past 240 hours).   Labs: CBC: Recent Labs  Lab 07/31/24 1355 08/01/24 0435  WBC 3.1* 3.5*  HGB 12.1 10.4*  HCT 37.9 33.5*  MCV 88.8 89.3  PLT 134* 139*   Basic Metabolic Panel: Recent Labs  Lab 07/31/24 1355 08/01/24 0435  NA 142 140  K 3.7 3.7  CL 102 104  CO2 22 26  GLUCOSE 245* 218*  BUN 17 16  CREATININE 0.77 0.78  CALCIUM  8.9 8.5*   Liver Function Tests: Recent Labs  Lab 07/31/24 1355  AST 96*  ALT 84*  ALKPHOS 118  BILITOT 1.4*  PROT 7.7  ALBUMIN 3.8   No results for input(s): LIPASE, AMYLASE in the last 168 hours. No results for input(s): AMMONIA in the last 168 hours. Cardiac Enzymes: No results for input(s): CKTOTAL, CKMB, CKMBINDEX, TROPONINI in the last 168 hours. BNP (last 3 results) Recent Labs     11/01/23 1058 05/14/24 1501 05/26/24 1626  BNP 184.7* 70.9 15.4   CBG: Recent Labs  Lab 07/31/24 1418 07/31/24 2125 08/01/24 0800 08/01/24 1106  GLUCAP 265* 212* 192* 264*    Time spent: 35 minutes  Signed:  Elvan Sor  [facility] 08/01/2024 1:02 PM

## 2024-08-01 NOTE — ED Notes (Signed)
 Labs collected and sent.

## 2024-08-01 NOTE — Progress Notes (Signed)
 PT Cancellation Note  Patient Details Name: Kim Buchanan MRN: [id number] DOB: 06/25/1951   Cancelled Treatment:    Reason Eval/Treat Not Completed: PT screened, no needs identified, will sign off. Discussed with OT- Pt currently at baseline and is indep with mobility. No acute PT needs noted. Please re-consult if needs change.   Richie Bonanno 08/01/2024, 8:32 AM Corean Dade, PT, DPT, [facility] [phone]

## 2024-08-02 LAB — HEPATITIS PANEL, ACUTE
HCV Ab: REACTIVE — AB
Hep A IgM: NONREACTIVE
Hep B C IgM: NONREACTIVE
Hepatitis B Surface Ag: NONREACTIVE

## 2024-08-03 ENCOUNTER — Ambulatory Visit: Attending: Medical

## 2024-08-03 ENCOUNTER — Telehealth: Payer: Self-pay

## 2024-08-03 DIAGNOSIS — R55 Syncope and collapse: Secondary | ICD-10-CM

## 2024-08-03 NOTE — Progress Notes (Signed)
 Kim Buchanan                                          MRN: [id number]   08/03/2024   The [facility] Quality Team Specialist reviewed this patient medical record for the purposes of chart review for care gap closure. The following were reviewed: chart review for care gap closure-glycemic status assessment.    [facility] Quality Team

## 2024-08-03 NOTE — Telephone Encounter (Signed)
 Heart monitor order placed as requested. Per chart review, pt has a follow up scheduled for 08/31/24.     Furth, Cadence H, PA-C  P Cv Div Burl Triage; P Cv Div Burl Scheduling Patient needs a 2 week heart monitor and follow-up in 2-4 weeks. thanks

## 2024-08-03 NOTE — Transitions of Care (Post Inpatient/ED Visit) (Signed)
 08/03/2024  Name: Kim Buchanan MRN: [id number] DOB: 1951-01-14  Today's TOC FU Call Status: Today's TOC FU Call Status:: Successful TOC FU Call Completed TOC FU Call Complete Date: 08/03/24 Patient's Name and Date of Birth confirmed.  Transition Care Management Follow-up Telephone Call Date of Discharge: 08/01/24 Discharge Facility: [facility]) Type of Discharge: Inpatient Admission Primary Inpatient Discharge Diagnosis:: syncope How have you been since you were released from the hospital?: Better Any questions or concerns?: No  Items Reviewed: Did you receive and understand the discharge instructions provided?: Yes Medications obtained,verified, and reconciled?: Yes (Medications Reviewed) Any new allergies since your discharge?: No Dietary orders reviewed?: Yes Do you have support at home?: Yes People in Home [RPT]: spouse  Medications Reviewed Today: Medications Reviewed Today     Reviewed by Emmitt Pan, LPN (Licensed Practical Nurse) on 08/03/24 at 1237  Med List Status: <None>   Medication Order Taking? Sig Documenting Provider Last Dose Status Informant  amiodarone  (PACERONE ) 200 MG tablet [id number] Yes Take 0.5 tablets (100 mg total) by mouth daily. Rolan Ezra RAMAN, MD  Active Self  aspirin  EC 81 MG tablet [id number] Yes Take 1 tablet (81 mg total) by mouth daily. Swallow whole. Gerard Frederick, NP  Active Self  Continuous Blood Gluc Receiver (FREESTYLE LIBRE 2 READER) DEVI [id number] Yes 1 each by Does not apply route daily. Melvin Pao, NP  Active Self  Continuous Glucose Sensor (FREESTYLE LIBRE 2 PLUS SENSOR) MISC [id number] Yes Change sensor every 15 days. Melvin Pao, NP  Active Self  furosemide  (LASIX ) 40 MG tablet [id number] Yes Take 1 tablet (40 mg total) by mouth daily as needed for fluid or edema. Von Bellis, MD  Active   glimepiride  (AMARYL ) 4 MG tablet [id number] Yes Take 4 mg by mouth daily with breakfast.  [provider]  Active Self  insulin  degludec (TRESIBA  FLEXTOUCH) 200 UNIT/ML FlexTouch Pen [id number] Yes Reduce from 100 units to 70 units.  Patient taking differently: Inject 88 Units into the skin daily.   [place]anda [place], MD  Active Self           Med Note ZENA, [facility] A   Mon [name] 15, 2024 11:35 AM) Gets through [facility] PAP- gets sent to [facility] clinic  insulin  lispro (HUMALOG  [facility]) 100 UNIT/ML KwikPen [id number] Yes Inject 20 Units into the skin 3 (three) times daily. With meals.  Patient taking differently: Inject 22 Units into the skin 3 (three) times daily. With meals.   Melvin Pao, NP  Active Self  Insulin  Pen Needle 30G X 6 MM MISC [id number] Yes 1 Application by Does not apply route daily. Melvin Pao, NP  Active Self           Med Note ZENA, CHERYL A   Fri Apr 12, 2023  9:08 AM) 4 times daily   Lancets [facility] [place]) MISC [id number] Yes TEST ONCE DAILY Melvin Pao, NP  Active Self  levothyroxine  (SYNTHROID ) 88 MCG tablet [id number] Yes Take 1 tablet (88 mcg total) by mouth daily. Melvin Pao, NP  Active Self  metFORMIN  (GLUCOPHAGE ) 1000 MG tablet [id number] Yes TAKE 1 TABLET BY MOUTH TWICE  DAILY WITH MEALS Melvin Pao, NP  Active Self  metoprolol  succinate (TOPROL -XL) 25 MG 24 hr tablet [id number] Yes TAKE 1 TABLET BY MOUTH DAILY  Patient taking differently: Take 12.5 mg by mouth daily. TAKE 1 TABLET BY MOUTH DAILY   Hammock, Sheri, NP  Active Self  nitroGLYCERIN  (NITROSTAT ) 0.3 MG SL tablet [id number]  Yes Place 1 tablet (0.3 mg total) under the tongue every 5 (five) minutes as needed for chest pain. Glena Harlene HERO, [place]  Active Self  AISHA FLING test strip [id number] Yes TEST ONCE DAILY Melvin Pao, NP  Active Self  pantoprazole  (PROTONIX ) 40 MG tablet [id number] Yes Take 1 tablet (40 mg total) by mouth daily. Rolan Ezra RAMAN, MD  Active Self  polyethylene glycol powder (GLYCOLAX /MIRALAX ) 17 GM/SCOOP powder [id number]  Yes Take 17 g by mouth daily. [provider]  Active Self  QUEtiapine  (SEROQUEL ) 200 MG tablet [id number] Yes TAKE 1 TABLET BY MOUTH DAILY AT  BEDTIME Melvin Pao, NP  Active Self  Semaglutide (RYBELSUS) 3 MG TABS [id number] Yes Take 3 mg by mouth daily. [provider]  Active Self  valsartan  (DIOVAN ) 40 MG tablet [id number] Yes Take 0.5 tablets (20 mg total) by mouth daily. Rolan Ezra RAMAN, MD  Active Self            Home Care and Equipment/Supplies: Were Home Health Services Ordered?: NA Any new equipment or medical supplies ordered?: NA  Functional Questionnaire: Do you need assistance with bathing/showering or dressing?: No Do you need assistance with meal preparation?: No Do you need assistance with eating?: No Do you have difficulty maintaining continence: No Do you need assistance with getting out of bed/getting out of a chair/moving?: No Do you have difficulty managing or taking your medications?: No  Follow up appointments reviewed: PCP Follow-up appointment confirmed?: Yes Date of PCP follow-up appointment?: 08/13/24 Follow-up Provider: [facility] Follow-up appointment confirmed?: Yes Date of Specialist follow-up appointment?: 08/03/24 Follow-Up Specialty Provider:: cardio Do you need transportation to your follow-up appointment?: No Do you understand care options if your condition(s) worsen?: Yes-patient verbalized understanding    SIGNATURE Julian Lemmings, LPN [facility] Nurse Health Advisor Direct Dial  [phone]

## 2024-08-04 ENCOUNTER — Other Ambulatory Visit: Payer: Self-pay

## 2024-08-04 NOTE — Progress Notes (Unsigned)
   07/21/2024  Patient ID: Kim Buchanan, female   DOB: 10/08/1951, 73 y.o.   MRN: [id number]  Subjective/Objective Telephone visit to follow-up on management of diabetes  Diabetes -Current medications:  Tresiba  88 units HS, Humalog  22 units TID with meals, metformin  1000mg  BID, glimepiride  4mg  with breakfast -Medications tried in the past: Ozempic- patient stopped taking after hospitalization in Oct of 2024 but is open to restarting this or Rybelsus -[facility] PAP for Rybelsus 3mg /7mg  has been approved and should arrive at [facility] in the next 2 weeks -A1c of 11.3% 7/29 -Humalog  and Tresiba  were increased by 10% after our last telephone visit 2 weeks ago, but patient states BG continues to run 200-300 -Patient is using Libre 2+ for CGM -Patient also receives Tresiba  through [facility] PAP, and 6 boxes were picked up last week  Assessment/Plan  Diabetes -Uncontrolled -Patient will take Rybelsus 3mg  daily x1 month, increasing to 7mg  daily thereafter once received from [facility] PAP.   -Contacted [facility] to see if they have a sample of Rybelsus 3mg  we could go ahead and get patient started on  Follow-up:  will notify about sample and schedule a follow-up in another 2-4 weeks  Kim Buchanan, PharmD, DPLA

## 2024-08-04 NOTE — Progress Notes (Signed)
   08/04/2024  Patient ID: Kim Buchanan, female   DOB: 09-14-1951, 73 y.o.   MRN: [id number]  Subjective/Objective Telephone visit to follow-up on management of diabetes  Diabetes -Current medications:  Tresiba  88 units HS, Humalog  22 units TID with meals, metformin  1000mg  BID, glimepiride  4mg  with breakfast, Rybleus 3mg  daily -Medications tried in the past: Ozempic- patient stopped taking after hospitalization in Oct of 2024 -[facility] PAP for Rybelsus 3mg /7mg  has been approved and should have already arrived at [facility] -Patient received Rybelsus 3mg  sample from [facility] while awaiting [facility] PAP delivery; she endorses tolerating this well so far with no adverse side effects -Patient is using Libre 2+ for CGM and state she has seen a slight improvement in BG readings.  These were previously in the 200s-300s and are now all below 300 -Does not endorse any s/sx of hypoglycemia -A1c of 11.3% 7/29 -Patient also receives Tresiba  through [facility] PAP, and Humalog  through [employer] PAP -ACEi/ARB on board for cardiorenal protection:  yes, valsartan  40mg  daily -Statin on board for ASCVD risk reduction:  no, LDL at goal without statin therapy; and patient has PMH for elevated LFT's  Hypertension -Current medications:  valsartan  40mg  daily, metoprolol  xl 12.5mg  daily, furosemide  40mg  daily PRN -Spironolactone  recently stopped during hospital admission for syncope event, thought to be related to orthostatic hypotension -Patient does not monitor home BP, because BP monitor quit working correctly     Assessment/Plan  Diabetes -Uncontrolled based on A1c goal of <7% -BP and LDL are at goal, and cardiorenal protection is optimized -Contacted [facility] to follow-up on processing/shipping of Rybelsus 3mg  and 7mg ; medication was delivered on 10/10- will verify office has received this -Patient made aware that starting in January, [facility] will no longer provide Rybelsus.  I will check on insurance coverage/copay at the beginning  of the year, and see if patient may be eligible for [employer] Medicare Extra Help during our next visit -[facility] states a 60 day refill of Rybelsus can be filled on 10/30, so I will work on a reorder form at this time to get patient additional information before program ends -Patient will continue 3mg  daily to complete 1 month at this dose, then she will increase to 7mg  daily -Continue Tresiba  88 units HS, Humalog  22 units TID with meals, metformin  1000mg  BID, glimepiride  4mg  with breakfast -Continue Libre 2+ for CGM- bring reader to next PCP visit to assess need for any further insulin  adjustments until we increase Rybelsus  Hypertension -Controlled based on last recorded BP of 111/69 on 10/11 before hospital discharge -Continue valsartan  40mg  daily, metoprolol  xl 12.5mg  daily, furosemide  40mg  daily PRN -I recommend patient monitor and record home BP daily -Contacting insurance to see if they will cover home BP monitor; if not, I will supply patient with one  Follow-up:  will notify about Ryblesus, home BP monitor and schedule 4 week follow-up  Kim Buchanan, PharmD, DPLA

## 2024-08-05 ENCOUNTER — Telehealth: Payer: Self-pay

## 2024-08-05 NOTE — Progress Notes (Signed)
   08/05/2024  Patient ID: Kim Buchanan, female   DOB: 11/21/1950, 73 y.o.   MRN: [id number]  Patient's insurance does not cover home BP monitors, and this is expensive for patient to purchase out of pocket, so I will leave one with PCP when I am at [facility] on 10/22 since patient has a follow-up visit on 10/23.  Contacted patient to make her aware of this and to let her know her Rybelsus 3mg  and 7mg  (1 month of each) has arrived from [facility] PAP.  Patient plans to pick Rybelsus up tomorrow and knows to complete all 3mg  tablets before starting 7mg .  Follow-up scheduled with me in 1 month.  [name], PharmD, DPLA

## 2024-08-07 NOTE — Progress Notes (Deleted)
 08/07/2024 Kim Buchanan [id number] November 02, 1950  Gastroenterology Office Note    Referring Provider: Melvin Pao, NP Primary Care Physician:  Kim Pao, NP  Primary GI Provider: Jinny Carmine, MD    Chief Complaint   No chief complaint on file.    History of Present Illness   Kim Buchanan is a 73 y.o. female with PMHX of elevated LFTs,  presenting today at the request of Kim Pao, NP due to  Patient seen by cardiology on 04/2023/2025 and referred back to GI for chronic elevated LFTs.   Patient last seen by Dr. Jinny in 12/2021/2023 for abnormal liver enzymes. ANA, AMA, ASMA negative; AAT normal. HbsAb reactive, HbsAg neg  07/31/2024 ultrasound right upper quadrant - IMPRESSION: Hepatomegaly.  Diffuse fatty infiltration of the liver.   Moderately distended gallbladder with small amount of sludge. Findings are similar to prior study.  05/21/2024 US  ABD RUQ with elastography IMPRESSION: ULTRASOUND RUQ: Fatty liver infiltration.  No gallstones or ductal dilatation   ULTRASOUND HEPATIC ELASTOGRAPHY:   Median kPa:  7.5 Past Medical History:  Diagnosis Date   Anxiety    Arthritis    lower back   COPD (chronic obstructive pulmonary disease) (HCC)    Depression    Diabetes mellitus without complication (HCC)    GERD (gastroesophageal reflux disease)    Hyperlipidemia    Morbid obesity (HCC)    NICM (nonischemic cardiomyopathy) (HCC)    a. 03/2019 Echo: Nl LVEF; b. 09/04/2019 Echo: EF 25-30%; c. 09/11/2019 Cath: nonobs LAD/RCA dzs. EF 50-55%; c. 12/2020 Echo: EF 30-35%, glob HK. Mild LVH. Nl RVSP. Mild MR; d. 02/2021 RHC: Elev RH pressures. CO/CI 2.8/1.3; e. 06/2021 cMRI: EF 40%, marked sept-lat dyssynch & septal HK. Small, discrete areas of mid-wall LGE in apical lateral and apical septal walls - ? h/o myocarditis.   Nonobstructive CAD (coronary artery disease)    a. 03/2019 MV Kim Buchanan): EF 63%, ant ischemia; b. 08/2019 Cath: LM nl, LAD 31m, RCA  70p (iFR nl @ 0.97), 50p. EF 50-55%-->Med Rx; c. 06/2020 Cor CTA: Nl FFRs throughout cor tree.   Osteoporosis    Panic attack    PTSD (post-traumatic stress disorder)    PVC's (premature ventricular contractions)    a. 05/2021 Zio: 20.6% PVC burden. 13.4 secs of NSVT-->amio added; b. 06/2021 Zio: 5.8% PVC burden. Rare supraventrciular ectopy.   Rectal fistula    Sleep apnea    a. Prev wore CPAP but lost insurance and hasn't been using since (now has insurance).   Wears dentures    full upper    Past Surgical History:  Procedure Laterality Date   CARDIAC CATHETERIZATION     COLONOSCOPY  2014   COLONOSCOPY WITH PROPOFOL  N/A 10/01/2016   Procedure: COLONOSCOPY WITH PROPOFOL ;  Surgeon: Buchanan Jinny, MD;  Location: [facility];  Service: Endoscopy;  Laterality: N/A;   COLONOSCOPY WITH PROPOFOL  N/A 01/04/2022   Procedure: COLONOSCOPY WITH PROPOFOL ;  Surgeon: Kim Carmine, MD;  Location: [facility] ENDOSCOPY;  Service: Endoscopy;  Laterality: N/A;   CORONARY PRESSURE/FFR WITH 3D MAPPING N/A 09/11/2019   Procedure: Coronary Pressure Wire/FFR w/3D Mapping;  Surgeon: Kim Bruckner, MD;  Location: [facility] INVASIVE CV LAB;  Service: Cardiovascular;  Laterality: N/A;   CYSTOCELE REPAIR N/A 12/17/2016   Procedure: ANTERIOR REPAIR (CYSTOCELE);  Surgeon: Kim Kim Dollar, MD;  Location: [facility] ORS;  Service: Gynecology;  Laterality: N/A;   ESOPHAGOGASTRODUODENOSCOPY (EGD) WITH PROPOFOL  N/A 08/11/2019   Procedure: ESOPHAGOGASTRODUODENOSCOPY (EGD) WITH PROPOFOL ;  Surgeon: Kim Carmine, MD;  Location: [facility] ENDOSCOPY;  Service: Endoscopy;  Laterality: N/A;   POLYPECTOMY  10/01/2016   Procedure: POLYPECTOMY;  Surgeon: Kim Copping, MD;  Location: [facility];  Service: Endoscopy;;   RECTAL SURGERY  06/28/2015   Rectal prolapse, laparoscopic rectopexy the coldCharmont, MD; [facility]   RIGHT HEART CATH N/A 03/21/2021   Procedure: RIGHT HEART CATH;  Surgeon: Kim Bruckner, MD;  Location: [facility] INVASIVE CV  LAB;  Service: Cardiovascular;  Laterality: N/A;   RIGHT/LEFT HEART CATH AND CORONARY ANGIOGRAPHY Bilateral 09/11/2019   Procedure: RIGHT/LEFT HEART CATH AND CORONARY ANGIOGRAPHY;  Surgeon: Kim Evalene PARAS, MD;  Location: [facility] INVASIVE CV LAB;  Service: Cardiovascular;  Laterality: Bilateral;   TOTAL ABDOMINAL HYSTERECTOMY W/ BILATERAL SALPINGOOPHORECTOMY     TUBAL LIGATION     VAGINAL HYSTERECTOMY Bilateral 12/17/2016   Procedure: HYSTERECTOMY VAGINAL WITH BILATERAL SALPINGO OOPHERECTOMY;  Surgeon: Kim Kim Dollar, MD;  Location: [facility] ORS;  Service: Gynecology;  Laterality: Bilateral;    Current Outpatient Medications  Medication Sig Dispense Refill   amiodarone  (PACERONE ) 200 MG tablet Take 0.5 tablets (100 mg total) by mouth daily. 45 tablet 3   aspirin  EC 81 MG tablet Take 1 tablet (81 mg total) by mouth daily. Swallow whole. 30 tablet 11   Continuous Blood Gluc Receiver (FREESTYLE LIBRE 2 READER) DEVI 1 each by Does not apply route daily. 1 each 0   Continuous Glucose Sensor (FREESTYLE LIBRE 2 PLUS SENSOR) MISC Change sensor every 15 days. 6 each 4   furosemide  (LASIX ) 40 MG tablet Take 1 tablet (40 mg total) by mouth daily as needed for fluid or edema.     glimepiride  (AMARYL ) 4 MG tablet Take 4 mg by mouth daily with breakfast.     insulin  degludec (TRESIBA  FLEXTOUCH) 200 UNIT/ML FlexTouch Pen Reduce from 100 units to 70 units. (Patient taking differently: Inject 88 Units into the skin daily.)     insulin  lispro (HUMALOG  KWIKPEN) 100 UNIT/ML KwikPen Inject 20 Units into the skin 3 (three) times daily. With meals. (Patient taking differently: Inject 22 Units into the skin 3 (three) times daily. With meals.) 60 mL 0   Insulin  Pen Needle 30G X 6 MM MISC 1 Application by Does not apply route daily. 100 each 2   Lancets (ONETOUCH DELICA PLUS LANCET30G) MISC TEST ONCE DAILY 100 each 0   levothyroxine  (SYNTHROID ) 88 MCG tablet Take 1 tablet (88 mcg total) by mouth daily. 90 tablet 1    metFORMIN  (GLUCOPHAGE ) 1000 MG tablet TAKE 1 TABLET BY MOUTH TWICE  DAILY WITH MEALS 200 tablet 0   metoprolol  succinate (TOPROL -XL) 25 MG 24 hr tablet TAKE 1 TABLET BY MOUTH DAILY (Patient taking differently: Take 12.5 mg by mouth daily. TAKE 1 TABLET BY MOUTH DAILY) 90 tablet 3   nitroGLYCERIN  (NITROSTAT ) 0.3 MG SL tablet Place 1 tablet (0.3 mg total) under the tongue every 5 (five) minutes as needed for chest pain. 100 tablet 3   ONETOUCH ULTRA test strip TEST ONCE DAILY 100 strip 3   pantoprazole  (PROTONIX ) 40 MG tablet Take 1 tablet (40 mg total) by mouth daily. 100 tablet 3   polyethylene glycol powder (GLYCOLAX /MIRALAX ) 17 GM/SCOOP powder Take 17 g by mouth daily.     QUEtiapine  (SEROQUEL ) 200 MG tablet TAKE 1 TABLET BY MOUTH DAILY AT  BEDTIME 90 tablet 3   Semaglutide (RYBELSUS) 3 MG TABS Take 3 mg by mouth daily.     valsartan  (DIOVAN ) 40 MG tablet Take 0.5 tablets (20 mg total) by mouth daily. 45  tablet 1   No current facility-administered medications for this visit.    Allergies as of 08/10/2024 - Review Complete 08/03/2024  Allergen Reaction Noted   Entresto  [sacubitril-valsartan ]  09/07/2021   Lisinopril Cough 06/05/2019    Family History  Problem Relation Age of Onset   Diabetes Mother    Diabetes Sister    Cancer Sister    Diabetes Brother    Lymphoma Brother    Heart disease Father    Hypertension Father    Diabetes Sister    Diabetes Brother    Lymphoma Brother    Heart disease Brother    Healthy Sister    Breast cancer Neg Hx    Ovarian cancer Neg Hx    Colon cancer Neg Hx     Social History   Socioeconomic History   Marital status: Married    Spouse name: Solicitor   Number of children: 1   Years of education: Not on file   Highest education level: 9th grade  Occupational History    Employer: DISABLED    Comment: for panic attacks  Tobacco Use   Smoking status: Former    Current packs/day: 0.00    Average packs/day: 1.5 packs/day for 50.0 years  (75.0 ttl pk-yrs)    Types: Cigarettes    Start date: 11/29/1968    Quit date: 11/29/2018    Years since quitting: 5.6   Smokeless tobacco: Never   Tobacco comments:    started at age 83  Vaping Use   Vaping status: Never Used  Substance and Sexual Activity   Alcohol use: No    Alcohol/week: 0.0 standard drinks of alcohol   Drug use: No   Sexual activity: Not Currently  Other Topics Concern   Not on file  Social History Narrative   Pt had one son, who died when was 84 YO in a MVA.   Social Drivers of Corporate investment banker Strain: Low Risk  (01/02/2024)   Overall Financial Resource Strain (CARDIA)    Difficulty of Paying Living Expenses: Not hard at all  Food Insecurity: No Food Insecurity (06/11/2024)   Hunger Vital Sign    Worried About Running Out of Food in the Last Year: Never true    Ran Out of Food in the Last Year: Never true  Transportation Needs: No Transportation Needs (06/11/2024)   PRAPARE - Administrator, Civil Service (Medical): No    Lack of Transportation (Non-Medical): No  Physical Activity: Inactive (01/02/2024)   Exercise Vital Sign    Days of Exercise per Week: 0 days    Minutes of Exercise per Session: 0 min  Stress: No Stress Concern Present (01/02/2024)   [employer] of Occupational Health - Occupational Stress Questionnaire    Feeling of Stress : Not at all  Social Connections: Socially Isolated (01/02/2024)   Social Connection and Isolation Panel    Frequency of Communication with Friends and Family: Once a week    Frequency of Social Gatherings with Friends and Family: Once a week    Attends Religious Services: Never    Database administrator or Organizations: No    Attends Banker Meetings: Never    Marital Status: Married  Catering manager Violence: Not At Risk (06/11/2024)   Humiliation, Afraid, Rape, and Kick questionnaire    Fear of Current or Ex-Partner: No    Emotionally Abused: No    Physically Abused: No     Sexually Abused: No  RELEVANT GI HISTORY, IMAGING AND LABS: CBC    Component Value Date/Time   WBC 3.5 (L) 08/01/2024 0435   RBC 3.75 (L) 08/01/2024 0435   HGB 10.4 (L) 08/01/2024 0435   HGB 12.5 02/12/2024 0900   HCT 33.5 (L) 08/01/2024 0435   HCT 39.2 02/12/2024 0900   PLT 139 (L) 08/01/2024 0435   PLT 165 02/12/2024 0900   MCV 89.3 08/01/2024 0435   MCV 89 02/12/2024 0900   MCV 95 02/24/2013 1107   MCH 27.7 08/01/2024 0435   MCHC 31.0 08/01/2024 0435   RDW 16.4 (H) 08/01/2024 0435   RDW 15.0 02/12/2024 0900   RDW 13.0 02/24/2013 1107   LYMPHSABS 1.3 02/12/2024 0900   LYMPHSABS 3.1 02/24/2013 1107   MONOABS 0.5 08/15/2023 1610   MONOABS 0.6 02/24/2013 1107   EOSABS 0.0 02/12/2024 0900   EOSABS 0.3 02/24/2013 1107   BASOSABS 0.0 02/12/2024 0900   BASOSABS 0.0 02/24/2013 1107   Recent Labs    08/15/23 1610 08/16/23 0510 08/17/23 0337 09/04/23 0852 02/12/24 0900 07/31/24 1355 08/01/24 0435  HGB 11.2* 9.7* 9.5* 12.5 12.5 12.1 10.4*    CMP     Component Value Date/Time   NA 140 08/01/2024 0435   NA 139 07/21/2024 1011   NA 143 02/24/2013 1107   K 3.7 08/01/2024 0435   K 4.1 02/24/2013 1107   CL 104 08/01/2024 0435   CL 111 (H) 02/24/2013 1107   CO2 26 08/01/2024 0435   CO2 26 02/24/2013 1107   GLUCOSE 218 (H) 08/01/2024 0435   GLUCOSE 87 02/24/2013 1107   BUN 16 08/01/2024 0435   BUN 19 07/21/2024 1011   BUN 11 02/24/2013 1107   CREATININE 0.78 08/01/2024 0435   CREATININE 0.82 01/01/2018 0000   CALCIUM  8.5 (L) 08/01/2024 0435   CALCIUM  8.6 02/24/2013 1107   PROT 7.7 07/31/2024 1355   PROT 7.4 05/19/2024 0910   PROT 7.2 02/24/2013 1107   ALBUMIN 3.8 07/31/2024 1355   ALBUMIN 4.3 05/19/2024 0910   ALBUMIN 3.6 02/24/2013 1107   AST 96 (H) 07/31/2024 1355   AST 58 (H) 02/24/2013 1107   ALT 84 (H) 07/31/2024 1355   ALT 94 (H) 02/24/2013 1107   ALKPHOS 118 07/31/2024 1355   ALKPHOS 113 02/24/2013 1107   BILITOT 1.4 (H) 07/31/2024 1355    BILITOT 0.6 05/19/2024 0910   BILITOT 0.4 02/24/2013 1107   GFRNONAA >60 08/01/2024 0435   GFRNONAA 76 01/01/2018 0000   GFRAA 79 10/17/2020 1027   GFRAA 88 01/01/2018 0000      Latest Ref Rng & Units 07/31/2024    1:55 PM 05/19/2024    9:10 AM 05/14/2024    3:01 PM  Hepatic Function  Total Protein 6.5 - 8.1 g/dL 7.7  7.4  7.8   Albumin 3.5 - 5.0 g/dL 3.8  4.3  3.7   AST 15 - 41 U/L 96  75  107   ALT 0 - 44 U/L 84  77  74   Alk Phosphatase 38 - 126 U/L 118  167  138   Total Bilirubin 0.0 - 1.2 mg/dL 1.4  0.6  1.1       Review of Systems   All systems reviewed and negative except where noted in HPI.    Physical Exam  There were no vitals taken for this visit. No LMP recorded. Patient has had a hysterectomy. General:   Alert and oriented. Pleasant and cooperative. Well-nourished and well-developed.  Head:  Normocephalic and  atraumatic. Eyes:  Without icterus Ears:  Normal auditory acuity. Neck:  Supple; no masses or thyromegaly. Lungs:  Respirations even and unlabored.  Clear throughout to auscultation.   No wheezes, crackles, or rhonchi. No acute distress. Heart:  Regular rate and rhythm; no murmurs, clicks, rubs, or gallops. Abdomen:  Normal bowel sounds.  No bruits.  Soft, non-tender and non-distended without masses, hepatosplenomegaly or hernias noted.  No guarding or rebound tenderness.  ***Negative Carnett sign.   Rectal:  Deferred.***  Msk:  Symmetrical without gross deformities. Normal posture. Extremities:  Without edema. Neurologic:  Alert and  oriented x4;  grossly normal neurologically. Skin:  Intact without significant lesions or rashes. Psych:  Alert and cooperative. Normal mood and affect.   Assessment & Plan   Kim Buchanan is a 73 y.o. female presenting today with    HCV RNA quant Fib 4 5.55  ELF  I discussed the assessment and treatment plan with the patient. The patient was provided an opportunity to ask questions and all were answered. The  patient agreed with the plan and demonstrated an understanding of the instructions.   The patient was advised to call back or seek an in-person evaluation if the symptoms worsen or if the condition fails to improve as anticipated.  Grayce Bohr, DNP, AGNP-C [facility]

## 2024-08-10 ENCOUNTER — Ambulatory Visit: Admitting: Family Medicine

## 2024-08-11 ENCOUNTER — Other Ambulatory Visit: Payer: Self-pay

## 2024-08-12 ENCOUNTER — Other Ambulatory Visit: Payer: Self-pay | Admitting: Nurse Practitioner

## 2024-08-13 ENCOUNTER — Telehealth: Payer: Self-pay | Admitting: Nurse Practitioner

## 2024-08-13 ENCOUNTER — Encounter: Payer: Self-pay | Admitting: Nurse Practitioner

## 2024-08-13 ENCOUNTER — Ambulatory Visit: Admitting: Nurse Practitioner

## 2024-08-13 ENCOUNTER — Ambulatory Visit: Attending: Nurse Practitioner

## 2024-08-13 VITALS — BP 109/65 | HR 85 | Temp 97.7°F | Ht 63.0 in | Wt 223.6 lb

## 2024-08-13 DIAGNOSIS — R7989 Other specified abnormal findings of blood chemistry: Secondary | ICD-10-CM

## 2024-08-13 DIAGNOSIS — R55 Syncope and collapse: Secondary | ICD-10-CM

## 2024-08-13 NOTE — Telephone Encounter (Unsigned)
 Copied from CRM #[id number]. Topic: General - Other >> Aug 13, 2024  1:47 PM Wess RAMAN wrote: Reason for CRM: Patient wanted to let Melvin Pao, NP know she has received the heart monitor and to not order it.

## 2024-08-13 NOTE — Progress Notes (Signed)
 BP 109/65   Pulse 85   Temp 97.7 F (36.5 C) (Oral)   Ht 5' 3 (1.6 m)   Wt 223 lb 9.6 oz (101.4 kg)   SpO2 95%   BMI 39.61 kg/m    Subjective:    Patient ID: Kim Buchanan, female    DOB: [birth year], 73 y.o.   MRN: [id number]  HPI: Kim Buchanan is a 73 y.o. female  Chief Complaint  Patient presents with   Loss of Consciousness    Patient states she passed out at [employer] and was seen at the ER 07/31/24   Syncope: Most likely vasovagal, possible transient orthostatic. Patient was monitored on telemetry leads noticed. Continued amiodarone  home dose Patient was seen by cardiology, recommended Zio patch for 2 weeks.  Recommended to reduce Lasix  to as needed and discontinue Aldactone .  Cardiology advised no driving until seen in the office and no vasovagal episodes in the next 4 weeks, then she can drive but patient does not drive anyway. Patient was cleared for discharge and follow-up with not patient.  Patient has not had any episodes since her hospitalization. She did not receive the ZIO monitor.     # Right extremity swelling: Resolved Noted on exam by admitting physician.  Venous duplex negative for DVT. During my exam I did not notice any significant swelling of lower legs.  No swelling since hospitalization.   # AGMA: Most likely secondary to hypoperfusion.  Blood pressure remains soft.  Lactic acid 4.5 >>3.1, NS bolus was given and lactic acid was trending down.  No signs of infection.  Clinically patient remained stable.  Patient was advised to follow with PCP to repeat labs in 1 to 2 weeks.  Repeat BMP and LFTs in 1 to 2 weeks  Relevant past medical, surgical, family and social history reviewed and updated as indicated. Interim medical history since our last visit reviewed. Allergies and medications reviewed and updated.  Review of Systems  All other systems reviewed and are negative.   Per HPI unless specifically indicated above     Objective:    BP  109/65   Pulse 85   Temp 97.7 F (36.5 C) (Oral)   Ht 5' 3 (1.6 m)   Wt 223 lb 9.6 oz (101.4 kg)   SpO2 95%   BMI 39.61 kg/m   Wt Readings from Last 3 Encounters:  08/13/24 223 lb 9.6 oz (101.4 kg)  07/21/24 223 lb 9.6 oz (101.4 kg)  07/07/24 222 lb 12.8 oz (101.1 kg)    Physical Exam Vitals and nursing note reviewed.  Constitutional:      General: She is not in acute distress.    Appearance: Normal appearance. She is obese. She is not ill-appearing, toxic-appearing or diaphoretic.  HENT:     Head: Normocephalic.     Right Ear: External ear normal.     Left Ear: External ear normal.     Nose: Nose normal.     Mouth/Throat:     Mouth: Mucous membranes are moist.     Pharynx: Oropharynx is clear.  Eyes:     General:        Right eye: No discharge.        Left eye: No discharge.     Extraocular Movements: Extraocular movements intact.     Conjunctiva/sclera: Conjunctivae normal.     Pupils: Pupils are equal, round, and reactive to light.  Cardiovascular:     Rate and Rhythm: Normal rate and regular  rhythm.     Heart sounds: No murmur heard. Pulmonary:     Effort: Pulmonary effort is normal. No respiratory distress.     Breath sounds: Normal breath sounds. No wheezing or rales.  Abdominal:     General: Abdomen is flat. Bowel sounds are normal. There is no distension.     Palpations: Abdomen is soft. There is no mass.     Tenderness: There is no abdominal tenderness. There is no right CVA tenderness, left CVA tenderness, guarding or rebound.     Hernia: No hernia is present.  Musculoskeletal:     Cervical back: Normal range of motion and neck supple.  Skin:    General: Skin is warm and dry.     Capillary Refill: Capillary refill takes less than 2 seconds.  Neurological:     General: No focal deficit present.     Mental Status: She is alert and oriented to person, place, and time. Mental status is at baseline.  Psychiatric:        Mood and Affect: Mood normal.         Behavior: Behavior normal.        Thought Content: Thought content normal.        Judgment: Judgment normal.     Results for orders placed or performed during the hospital encounter of 07/31/24  Comprehensive metabolic panel   Collection Time: 07/31/24  1:55 PM  Result Value Ref Range   Sodium 142 135 - 145 mmol/L   Potassium 3.7 3.5 - 5.1 mmol/L   Chloride 102 98 - 111 mmol/L   CO2 22 22 - 32 mmol/L   Glucose, Bld 245 (H) 70 - 99 mg/dL   BUN 17 8 - 23 mg/dL   Creatinine, Ser 9.22 0.44 - 1.00 mg/dL   Calcium  8.9 8.9 - 10.3 mg/dL   Total Protein 7.7 6.5 - 8.1 g/dL   Albumin 3.8 3.5 - 5.0 g/dL   AST 96 (H) 15 - 41 U/L   ALT 84 (H) 0 - 44 U/L   Alkaline Phosphatase 118 38 - 126 U/L   Total Bilirubin 1.4 (H) 0.0 - 1.2 mg/dL   GFR, Estimated >39 >39 mL/min   Anion gap 18 (H) 5 - 15  CBC   Collection Time: 07/31/24  1:55 PM  Result Value Ref Range   WBC 3.1 (L) 4.0 - 10.5 K/uL   RBC 4.27 3.87 - 5.11 MIL/uL   Hemoglobin 12.1 12.0 - 15.0 g/dL   HCT 62.0 63.9 - 53.9 %   MCV 88.8 80.0 - 100.0 fL   MCH 28.3 26.0 - 34.0 pg   MCHC 31.9 30.0 - 36.0 g/dL   RDW 83.3 (H) 88.4 - 84.4 %   Platelets 134 (L) 150 - 400 K/uL   nRBC 0.0 0.0 - 0.2 %  Troponin I (High Sensitivity)   Collection Time: 07/31/24  1:55 PM  Result Value Ref Range   Troponin I (High Sensitivity) 4 <18 ng/L  CBG monitoring, ED   Collection Time: 07/31/24  2:18 PM  Result Value Ref Range   Glucose-Capillary 265 (H) 70 - 99 mg/dL  Troponin I (High Sensitivity)   Collection Time: 07/31/24  3:55 PM  Result Value Ref Range   Troponin I (High Sensitivity) 8 <18 ng/L  Urinalysis, Routine w reflex microscopic -Urine, Clean Catch   Collection Time: 07/31/24  4:04 PM  Result Value Ref Range   Color, Urine YELLOW (A) YELLOW   APPearance CLEAR (A) CLEAR  Specific Gravity, Urine 1.009 1.005 - 1.030   pH 5.0 5.0 - 8.0   Glucose, UA 50 (A) NEGATIVE mg/dL   Hgb urine dipstick NEGATIVE NEGATIVE   Bilirubin Urine NEGATIVE  NEGATIVE   Ketones, ur NEGATIVE NEGATIVE mg/dL   Protein, ur NEGATIVE NEGATIVE mg/dL   Nitrite NEGATIVE NEGATIVE   Leukocytes,Ua NEGATIVE NEGATIVE  Lactic acid, plasma   Collection Time: 07/31/24  4:16 PM  Result Value Ref Range   Lactic Acid, Venous 4.5 (HH) 0.5 - 1.9 mmol/L  Lactic acid, plasma   Collection Time: 07/31/24  5:20 PM  Result Value Ref Range   Lactic Acid, Venous 3.7 (HH) 0.5 - 1.9 mmol/L  Lactic acid, plasma   Collection Time: 07/31/24  9:17 PM  Result Value Ref Range   Lactic Acid, Venous 3.1 (HH) 0.5 - 1.9 mmol/L  CBG monitoring, ED   Collection Time: 07/31/24  9:25 PM  Result Value Ref Range   Glucose-Capillary 212 (H) 70 - 99 mg/dL  CBC   Collection Time: 08/01/24  4:35 AM  Result Value Ref Range   WBC 3.5 (L) 4.0 - 10.5 K/uL   RBC 3.75 (L) 3.87 - 5.11 MIL/uL   Hemoglobin 10.4 (L) 12.0 - 15.0 g/dL   HCT 66.4 (L) 63.9 - 53.9 %   MCV 89.3 80.0 - 100.0 fL   MCH 27.7 26.0 - 34.0 pg   MCHC 31.0 30.0 - 36.0 g/dL   RDW 83.5 (H) 88.4 - 84.4 %   Platelets 139 (L) 150 - 400 K/uL   nRBC 0.0 0.0 - 0.2 %  Basic metabolic panel   Collection Time: 08/01/24  4:35 AM  Result Value Ref Range   Sodium 140 135 - 145 mmol/L   Potassium 3.7 3.5 - 5.1 mmol/L   Chloride 104 98 - 111 mmol/L   CO2 26 22 - 32 mmol/L   Glucose, Bld 218 (H) 70 - 99 mg/dL   BUN 16 8 - 23 mg/dL   Creatinine, Ser 9.21 0.44 - 1.00 mg/dL   Calcium  8.5 (L) 8.9 - 10.3 mg/dL   GFR, Estimated >39 >39 mL/min   Anion gap 10 5 - 15  Hepatitis panel, acute   Collection Time: 08/01/24  4:35 AM  Result Value Ref Range   Hepatitis B Surface Ag NON REACTIVE NON REACTIVE   HCV Ab Reactive (A) NON REACTIVE   Hep A IgM NON REACTIVE NON REACTIVE   Hep B C IgM NON REACTIVE NON REACTIVE  CBG monitoring, ED   Collection Time: 08/01/24  8:00 AM  Result Value Ref Range   Glucose-Capillary 192 (H) 70 - 99 mg/dL  Vitamin B12   Collection Time: 08/01/24  8:41 AM  Result Value Ref Range   Vitamin B-12 160 (L)  180 - 914 pg/mL  VITAMIN D  25 Hydroxy (Vit-D Deficiency, Fractures)   Collection Time: 08/01/24  8:41 AM  Result Value Ref Range   Vit D, 25-Hydroxy 17.72 (L) 30 - 100 ng/mL  CBG monitoring, ED   Collection Time: 08/01/24 11:06 AM  Result Value Ref Range   Glucose-Capillary 264 (H) 70 - 99 mg/dL      Assessment & Plan:   Problem List Items Addressed This Visit   None Visit Diagnoses       Syncope, unspecified syncope type    -  Primary   No more episodes since hospitalization. Suspected it was vasovagal. Did not receive ZIO monitor from hospital. Ordered during visit today. Labs ordered.   Relevant Orders  Comp Met (CMET)   CBC w/Diff   Hepatic function panel   LONG TERM MONITOR (3-14 DAYS)     Elevated LFTs       Repeat LFTs during visit today.  Missed original GI appt. Rescheduled for November.        Follow up plan: No follow-ups on file.

## 2024-08-14 ENCOUNTER — Ambulatory Visit: Payer: Self-pay | Admitting: Nurse Practitioner

## 2024-08-14 DIAGNOSIS — R748 Abnormal levels of other serum enzymes: Secondary | ICD-10-CM

## 2024-08-14 LAB — CBC WITH DIFFERENTIAL/PLATELET
Basophils Absolute: 0 x10E3/uL (ref 0.0–0.2)
Basos: 0 %
EOS (ABSOLUTE): 0 x10E3/uL (ref 0.0–0.4)
Eos: 1 %
Hematocrit: 34.1 % (ref 34.0–46.6)
Hemoglobin: 10.7 g/dL — ABNORMAL LOW (ref 11.1–15.9)
Immature Grans (Abs): 0 x10E3/uL (ref 0.0–0.1)
Immature Granulocytes: 0 %
Lymphocytes Absolute: 0.8 x10E3/uL (ref 0.7–3.1)
Lymphs: 25 %
MCH: 28.5 pg (ref 26.6–33.0)
MCHC: 31.4 g/dL — ABNORMAL LOW (ref 31.5–35.7)
MCV: 91 fL (ref 79–97)
Monocytes Absolute: 0.3 x10E3/uL (ref 0.1–0.9)
Monocytes: 9 %
Neutrophils Absolute: 2 x10E3/uL (ref 1.4–7.0)
Neutrophils: 64 %
Platelets: 154 x10E3/uL (ref 150–450)
RBC: 3.75 x10E6/uL — ABNORMAL LOW (ref 3.77–5.28)
RDW: 15.9 % — ABNORMAL HIGH (ref 11.7–15.4)
WBC: 3.1 x10E3/uL — ABNORMAL LOW (ref 3.4–10.8)

## 2024-08-14 LAB — COMPREHENSIVE METABOLIC PANEL WITH GFR
ALT: 88 IU/L — ABNORMAL HIGH (ref 0–32)
AST: 161 IU/L — ABNORMAL HIGH (ref 0–40)
Albumin: 3.9 g/dL (ref 3.8–4.8)
Alkaline Phosphatase: 161 IU/L — ABNORMAL HIGH (ref 49–135)
BUN/Creatinine Ratio: 16 (ref 12–28)
BUN: 11 mg/dL (ref 8–27)
Bilirubin Total: 1 mg/dL (ref 0.0–1.2)
CO2: 22 mmol/L (ref 20–29)
Calcium: 8.5 mg/dL — ABNORMAL LOW (ref 8.7–10.3)
Chloride: 102 mmol/L (ref 96–106)
Creatinine, Ser: 0.68 mg/dL (ref 0.57–1.00)
Globulin, Total: 2.8 g/dL (ref 1.5–4.5)
Glucose: 244 mg/dL — ABNORMAL HIGH (ref 70–99)
Potassium: 4.2 mmol/L (ref 3.5–5.2)
Sodium: 141 mmol/L (ref 134–144)
Total Protein: 6.7 g/dL (ref 6.0–8.5)
eGFR: 93 mL/min/1.73 (ref >59)

## 2024-08-14 LAB — HEPATIC FUNCTION PANEL: Bilirubin, Direct: 0.62 mg/dL — ABNORMAL HIGH (ref 0.00–0.40)

## 2024-08-14 NOTE — Telephone Encounter (Signed)
 Requested Prescriptions  Pending Prescriptions Disp Refills   Lancets (ONETOUCH DELICA PLUS LANCET30G) MISC [Pharmacy Med Name: OneTouch Delica Plus Lancet30G] 100 each 1    Sig: TEST ONCE DAILY     Endocrinology: Diabetes - Testing Supplies Passed - 08/14/2024 12:59 PM      Passed - Valid encounter within last 12 months    Recent Outpatient Visits           Yesterday Syncope, unspecified syncope type   [facility] Melvin Pao, NP   2 months ago Hypertension associated with diabetes [facility])   [place] [facility] Melvin Pao, NP   6 months ago Annual physical exam   [facility] Melvin Pao, NP       Future Appointments             In 2 weeks Gollan, Timothy J, MD [facility]

## 2024-08-14 NOTE — Telephone Encounter (Signed)
 Please let her know that I am not able to cancel the order but to only wear the one that she received and don't use the second one that I ordered.

## 2024-08-14 NOTE — Telephone Encounter (Signed)
 Called and notified patient of providers message.

## 2024-08-19 ENCOUNTER — Telehealth: Payer: Self-pay

## 2024-08-19 NOTE — Telephone Encounter (Signed)
 Received a refill reorder form [employer], fill and faxed provider to sign and date can be faxed to [employer] or fax back to [phone].

## 2024-08-21 ENCOUNTER — Encounter: Payer: Self-pay | Admitting: Nurse Practitioner

## 2024-08-21 ENCOUNTER — Ambulatory Visit (INDEPENDENT_AMBULATORY_CARE_PROVIDER_SITE_OTHER): Admitting: Nurse Practitioner

## 2024-08-21 VITALS — BP 116/70 | HR 98 | Temp 97.9°F | Ht 63.0 in | Wt 221.8 lb

## 2024-08-21 DIAGNOSIS — E1159 Type 2 diabetes mellitus with other circulatory complications: Secondary | ICD-10-CM | POA: Diagnosis not present

## 2024-08-21 DIAGNOSIS — I5020 Unspecified systolic (congestive) heart failure: Secondary | ICD-10-CM

## 2024-08-21 DIAGNOSIS — E1169 Type 2 diabetes mellitus with other specified complication: Secondary | ICD-10-CM

## 2024-08-21 DIAGNOSIS — I152 Hypertension secondary to endocrine disorders: Secondary | ICD-10-CM

## 2024-08-21 DIAGNOSIS — E1142 Type 2 diabetes mellitus with diabetic polyneuropathy: Secondary | ICD-10-CM

## 2024-08-21 DIAGNOSIS — E785 Hyperlipidemia, unspecified: Secondary | ICD-10-CM

## 2024-08-21 DIAGNOSIS — J410 Simple chronic bronchitis: Secondary | ICD-10-CM

## 2024-08-21 DIAGNOSIS — E039 Hypothyroidism, unspecified: Secondary | ICD-10-CM

## 2024-08-21 DIAGNOSIS — R202 Paresthesia of skin: Secondary | ICD-10-CM

## 2024-08-21 MED ORDER — ROPINIROLE HCL 0.25 MG PO TABS: 0.2500 mg | ORAL_TABLET | Freq: Every day | ORAL | 1 refills | Status: DC

## 2024-08-21 MED ORDER — LEVOTHYROXINE SODIUM 88 MCG PO TABS: 88.0000 ug | ORAL_TABLET | Freq: Every day | ORAL | 1 refills | Status: DC

## 2024-08-21 NOTE — Assessment & Plan Note (Signed)
 Has noticed a decrease in appetite since starting Rybelsus.  Only on 3mg .  Will increase to 7mg  daily in 1 month.

## 2024-08-21 NOTE — Assessment & Plan Note (Signed)
 Chronic. Controlled.  Recently saw Cardiology who added back valsartan  and lasix .   Follow up in 3 months.  Labs ordered at visit today.

## 2024-08-21 NOTE — Telephone Encounter (Signed)
 Received provider portion Refill reorder form [facility], faxed to [facility] today

## 2024-08-21 NOTE — Assessment & Plan Note (Signed)
 Exacerbated since stopping Gabapentin .  Will start Requip 0.25mg  nightly.  Side effects and benefits of medication discussed.  Follow up in 3 months.  Call sooner if concerns arise.

## 2024-08-21 NOTE — Assessment & Plan Note (Signed)
 Chronic.  Controlled.  Continue with current medication regimen.  Pulmonology appt in November.  Will call and see if patient has made one or if we can assist her in making one.  Labs ordered today.  Return to clinic in 3 months for reevaluation.  Call sooner if concerns arise.

## 2024-08-21 NOTE — Assessment & Plan Note (Signed)
 Chronic. Blood pressure well controlled at visit today.  Followed by [facility].  Patient's LFTs are elevated but has known Hepatic steatosis.  Cardiology concerned with volume overload and started on Lasix  and decreased down to 20mg  daily.  Has follow up with HF clinic in November.  Follow up in 3 months.  Labs ordered today.  Recent Cardiology note reviewed today.

## 2024-08-21 NOTE — Assessment & Plan Note (Signed)
 Chronic.  Controlled.  Continue with current medication regimen of Levothyroxine  88mcg daily.  Labs ordered today.  Return to clinic in 6 months for reevaluation.  Call sooner if concerns arise.

## 2024-08-21 NOTE — Assessment & Plan Note (Signed)
Chronic.  Controlled.  Continue with current medication regimen on Atorvastatin 10mg.  Labs ordered today.  Return to clinic in 3 months for reevaluation.  Call sooner if concerns arise.   

## 2024-08-21 NOTE — Assessment & Plan Note (Signed)
 Chronic.  Not as well controlled.  Last A1c was 11.3% in July.  No longer wants to see Endocrinology.   No longer on Ozempic due to hospitalization in October. Continue with Tresiba  88u and increase Humalog  to 22u. Rybelsus was started at 3mg .  Will increase in 30 days to 7mg .  Sugars at running 200s.  Follow up in 3 months.  Call sooner if concerns arise.

## 2024-08-21 NOTE — Progress Notes (Signed)
 BP 116/70   Pulse 98   Temp 97.9 F (36.6 C) (Oral)   Ht 5' 3 (1.6 m)   Wt 221 lb 12.8 oz (100.6 kg)   SpO2 96%   BMI 39.29 kg/m    Subjective:    Patient ID: Kim Buchanan, female    DOB: 1951-07-03, 73 y.o.   MRN: [id number]  HPI: Kim Buchanan is a 74 y.o. female  Chief Complaint  Patient presents with  . Diabetes  . Hyperlipidemia  . Hypertension   HYPERTENSION / HYPERLIPIDEMIA Continues to follow up with Cardiology.  Doing well with current medication regimen.  She is having SOB.  She will have an US  of the RUQ.  She was referred to GI.  Back on Lasix  20mg , Spirolactone and Valsartan . Satisfied with current treatment? yes Duration of hypertension: years BP monitoring frequency: a few times a month BP range:  BP medication side effects: no Past BP meds: metoprolol  and valsartan  Duration of hyperlipidemia: years Cholesterol medication side effects: no Cholesterol supplements: none Past cholesterol medications: atorvastain (lipitor) Medication compliance: excellent compliance Aspirin : yes Recent stressors: no Recurrent headaches: no Visual changes: no Palpitations: no Dyspnea: yes Chest pain: no Lower extremity edema: no Dizzy/lightheaded: no  DIABETES Patient no longer wants to see Endocrinology. Started on Rybelsus 3mg .  She will increase to the 7mg  in about a month.  She has continued on Long and Short acting insulin .  Now using freestyle libre.  Feels like she is less hungry and lost 2lbs since last visit.  Hypoglycemic episodes:no Polydipsia/polyuria: no Visual disturbance: no Chest pain: no Paresthesias: no Glucose Monitoring: yes  Accucheck frequency: in the morning  Fasting glucose: 200s  Post prandial:  Evening:  Before meals: Taking Insulin ?: yes  Long acting insulin : Tresiba  88u  Short acting insulin : Humalog  22 TID Blood Pressure Monitoring: a few times a month Retinal Examination: Up to Date Foot Exam: Up to Date Diabetic  Education: Not Completed Pneumovax: Up to Date Influenza: Up to Date Aspirin : yes  RESTLESS LEGS Duration: months Discomfort description:  ill-defined Pain: no Location: lower legs Bilateral: yes Symmetric: yes Severity: 5/10 Onset:  gradual Frequency:  nightly Symptoms only occur while legs at rest: yes Sudden unintentional leg jerking: yes Bed partner bothered by leg movements: yes LE numbness: no Decreased sensation: no Weakness: yes Insomnia: yes Daytime somnolence: no Fatigue: yes Alleviating factors:  Aggravating factors:  Status: worse Treatments attempted: was on Gabapentin  in the past but stopped due to other side effects   Relevant past medical, surgical, family and social history reviewed and updated as indicated. Interim medical history since our last visit reviewed. Allergies and medications reviewed and updated.  Review of Systems  Constitutional:  Positive for fatigue. Negative for fever and unexpected weight change.  Eyes:  Negative for visual disturbance.  Respiratory:  Positive for shortness of breath. Negative for cough and chest tightness.   Cardiovascular:  Negative for chest pain, palpitations and leg swelling.  Gastrointestinal:  Negative for constipation and diarrhea.  Endocrine: Negative for cold intolerance, heat intolerance, polydipsia and polyuria.  Neurological:  Positive for weakness. Negative for dizziness, numbness and headaches.       Leg movement  Psychiatric/Behavioral:  Negative for dysphoric mood. The patient is not nervous/anxious.     Per HPI unless specifically indicated above     Objective:    BP 116/70   Pulse 98   Temp 97.9 F (36.6 C) (Oral)   Ht 5' 3 (1.6 m)  Wt 221 lb 12.8 oz (100.6 kg)   SpO2 96%   BMI 39.29 kg/m   Wt Readings from Last 3 Encounters:  08/21/24 221 lb 12.8 oz (100.6 kg)  08/13/24 223 lb 9.6 oz (101.4 kg)  07/21/24 223 lb 9.6 oz (101.4 kg)    Physical Exam Vitals and nursing note reviewed.   Constitutional:      General: She is not in acute distress.    Appearance: Normal appearance. She is normal weight. She is not ill-appearing, toxic-appearing or diaphoretic.  HENT:     Head: Normocephalic.     Right Ear: External ear normal.     Left Ear: External ear normal.     Nose: Nose normal.     Mouth/Throat:     Mouth: Mucous membranes are moist.     Pharynx: Oropharynx is clear.  Eyes:     General:        Right eye: No discharge.        Left eye: No discharge.     Extraocular Movements: Extraocular movements intact.     Conjunctiva/sclera: Conjunctivae normal.     Pupils: Pupils are equal, round, and reactive to light.  Cardiovascular:     Rate and Rhythm: Normal rate and regular rhythm.     Heart sounds: No murmur heard. Pulmonary:     Effort: Pulmonary effort is normal. No respiratory distress.     Breath sounds: Normal breath sounds. No wheezing or rales.  Musculoskeletal:     Cervical back: Normal range of motion and neck supple.     Right lower leg: No edema.     Left lower leg: No edema.  Skin:    General: Skin is warm and dry.     Capillary Refill: Capillary refill takes less than 2 seconds.  Neurological:     General: No focal deficit present.     Mental Status: She is alert and oriented to person, place, and time. Mental status is at baseline.  Psychiatric:        Mood and Affect: Mood normal.        Behavior: Behavior normal.        Thought Content: Thought content normal.        Judgment: Judgment normal.     Results for orders placed or performed in visit on 08/13/24  Comp Met (CMET)   Collection Time: 08/13/24  1:23 PM  Result Value Ref Range   Glucose 244 (H) 70 - 99 mg/dL   BUN 11 8 - 27 mg/dL   Creatinine, Ser 9.31 0.57 - 1.00 mg/dL   eGFR 93 >40 fO/fpw/8.26   BUN/Creatinine Ratio 16 12 - 28   Sodium 141 134 - 144 mmol/L   Potassium 4.2 3.5 - 5.2 mmol/L   Chloride 102 96 - 106 mmol/L   CO2 22 20 - 29 mmol/L   Calcium  8.5 (L) 8.7 - 10.3  mg/dL   Total Protein 6.7 6.0 - 8.5 g/dL   Albumin 3.9 3.8 - 4.8 g/dL   Globulin, Total 2.8 1.5 - 4.5 g/dL   Bilirubin Total 1.0 0.0 - 1.2 mg/dL   Alkaline Phosphatase 161 (H) 49 - 135 IU/L   AST 161 (H) 0 - 40 IU/L   ALT 88 (H) 0 - 32 IU/L  CBC w/Diff   Collection Time: 08/13/24  1:23 PM  Result Value Ref Range   WBC 3.1 (L) 3.4 - 10.8 x10E3/uL   RBC 3.75 (L) 3.77 - 5.28 x10E6/uL   Hemoglobin  10.7 (L) 11.1 - 15.9 g/dL   Hematocrit 65.8 65.9 - 46.6 %   MCV 91 79 - 97 fL   MCH 28.5 26.6 - 33.0 pg   MCHC 31.4 (L) 31.5 - 35.7 g/dL   RDW 84.0 (H) 88.2 - 84.5 %   Platelets 154 150 - 450 x10E3/uL   Neutrophils 64 Not Estab. %   Lymphs 25 Not Estab. %   Monocytes 9 Not Estab. %   Eos 1 Not Estab. %   Basos 0 Not Estab. %   Neutrophils Absolute 2.0 1.4 - 7.0 x10E3/uL   Lymphocytes Absolute 0.8 0.7 - 3.1 x10E3/uL   Monocytes Absolute 0.3 0.1 - 0.9 x10E3/uL   EOS (ABSOLUTE) 0.0 0.0 - 0.4 x10E3/uL   Basophils Absolute 0.0 0.0 - 0.2 x10E3/uL   Immature Granulocytes 0 Not Estab. %   Immature Grans (Abs) 0.0 0.0 - 0.1 x10E3/uL  Hepatic function panel   Collection Time: 08/13/24  1:23 PM  Result Value Ref Range   Bilirubin, Direct 0.62 (H) 0.00 - 0.40 mg/dL      Assessment & Plan:   Problem List Items Addressed This Visit       Cardiovascular and Mediastinum   Hypertension associated with diabetes (HCC)   Chronic. Controlled.  Recently saw Cardiology who added back valsartan  and lasix .   Follow up in 3 months.  Labs ordered at visit today.        Relevant Orders   TSH   T4   HFrEF (heart failure with reduced ejection fraction) (HCC)   Chronic. Blood pressure well controlled at visit today.  Followed by [facility].  Patient's LFTs are elevated but has known Hepatic steatosis.  Cardiology concerned with volume overload and started on Lasix  and decreased down to 20mg  daily.  Has follow up with HF clinic in November.  Follow up in 3 months.  Labs ordered today.  Recent Cardiology note  reviewed today.        Respiratory   COPD (chronic obstructive pulmonary disease) (HCC)   Chronic.  Controlled.  Continue with current medication regimen.  Pulmonology appt in November.  Will call and see if patient has made one or if we can assist her in making one.  Labs ordered today.  Return to clinic in 3 months for reevaluation.  Call sooner if concerns arise.         Endocrine   Hyperlipidemia associated with type 2 diabetes mellitus (HCC)   Chronic.  Controlled.  Continue with current medication regimen on Atorvastatin  10mg .  Labs ordered today.  Return to clinic in 3 months for reevaluation.  Call sooner if concerns arise.       Relevant Orders   Lipid panel   Type 2 diabetes mellitus with peripheral neuropathy (HCC) - Primary   Chronic.  Not as well controlled.  Last A1c was 11.3% in July.  No longer wants to see Endocrinology.   No longer on Ozempic due to hospitalization in October. Continue with Tresiba  88u and increase Humalog  to 22u. Rybelsus was started at 3mg .  Will increase in 30 days to 7mg .  Sugars at running 200s.  Follow up in 3 months.  Call sooner if concerns arise.       Relevant Medications   rOPINIRole (REQUIP) 0.25 MG tablet   Other Relevant Orders   Comprehensive metabolic panel with GFR   Hemoglobin A1c   Hypothyroidism   Chronic.  Controlled.  Continue with current medication regimen of Levothyroxine  88mcg daily.  Labs  ordered today.  Return to clinic in 6 months for reevaluation.  Call sooner if concerns arise.       Relevant Medications   levothyroxine  (SYNTHROID ) 88 MCG tablet     Other   Morbid obesity (HCC)   Has noticed a decrease in appetite since starting Rybelsus.  Only on 3mg .  Will increase to 7mg  daily in 1 month.       Leg paresthesia   Exacerbated since stopping Gabapentin .  Will start Requip 0.25mg  nightly.  Side effects and benefits of medication discussed.  Follow up in 3 months.  Call sooner if concerns arise.        Follow  up plan: Return in about 3 months (around 11/21/2024) for HTN, HLD, DM2 FU.

## 2024-08-22 LAB — COMPREHENSIVE METABOLIC PANEL WITH GFR
ALT: 56 IU/L — ABNORMAL HIGH (ref 0–32)
AST: 73 IU/L — ABNORMAL HIGH (ref 0–40)
Albumin: 4.1 g/dL (ref 3.8–4.8)
Alkaline Phosphatase: 171 IU/L — ABNORMAL HIGH (ref 49–135)
BUN/Creatinine Ratio: 20 (ref 12–28)
BUN: 15 mg/dL (ref 8–27)
Bilirubin Total: 0.9 mg/dL (ref 0.0–1.2)
CO2: 18 mmol/L — ABNORMAL LOW (ref 20–29)
Calcium: 9.1 mg/dL (ref 8.7–10.3)
Chloride: 101 mmol/L (ref 96–106)
Creatinine, Ser: 0.76 mg/dL (ref 0.57–1.00)
Globulin, Total: 3.1 g/dL (ref 1.5–4.5)
Glucose: 205 mg/dL — ABNORMAL HIGH (ref 70–99)
Potassium: 4.4 mmol/L (ref 3.5–5.2)
Sodium: 138 mmol/L (ref 134–144)
Total Protein: 7.2 g/dL (ref 6.0–8.5)
eGFR: 84 mL/min/1.73 (ref >59)

## 2024-08-22 LAB — HEMOGLOBIN A1C
Est. average glucose Bld gHb Est-mCnc: 272 mg/dL
Hgb A1c MFr Bld: 11.1 % — ABNORMAL HIGH (ref 4.8–5.6)

## 2024-08-22 LAB — LIPID PANEL
Chol/HDL Ratio: 2.7 ratio (ref 0.0–4.4)
Cholesterol, Total: 94 mg/dL — ABNORMAL LOW (ref 100–199)
HDL: 35 mg/dL — ABNORMAL LOW (ref >39)
LDL Chol Calc (NIH): 41 mg/dL (ref 0–99)
Triglycerides: 93 mg/dL (ref 0–149)
VLDL Cholesterol Cal: 18 mg/dL (ref 5–40)

## 2024-08-22 LAB — T4: T4, Total: 12.6 ug/dL — ABNORMAL HIGH (ref 4.5–12.0)

## 2024-08-22 LAB — TSH: TSH: 2.79 u[IU]/mL (ref 0.450–4.500)

## 2024-08-25 ENCOUNTER — Ambulatory Visit: Admitting: Pulmonary Disease

## 2024-08-25 ENCOUNTER — Ambulatory Visit: Payer: Self-pay | Admitting: Nurse Practitioner

## 2024-08-26 ENCOUNTER — Other Ambulatory Visit: Payer: Self-pay

## 2024-08-26 NOTE — Patient Outreach (Signed)
 Complex Care Management   Visit Note  08/27/2024  Name:  Kim Buchanan MRN: [id number] DOB: July 01, 1951  Situation: Referral received for Complex Care Management related to needing assistance with managing multiple appointments. I obtained verbal consent from Patient.  Visit completed with Kim Buchanan  on the phone. Patient is concerned about shortness of breath with moderate activity, elevated liver enzymes, that she feels may be related to taking amiodarone .    Background:   Past Medical History:  Diagnosis Date   Anxiety    Arthritis    lower back   COPD (chronic obstructive pulmonary disease) (HCC)    Depression    Diabetes mellitus without complication (HCC)    GERD (gastroesophageal reflux disease)    Hyperlipidemia    Morbid obesity (HCC)    NICM (nonischemic cardiomyopathy) (HCC)    a. 03/2019 Echo: Nl LVEF; b. 09/04/2019 Echo: EF 25-30%; c. 09/11/2019 Cath: nonobs LAD/RCA dzs. EF 50-55%; c. 12/2020 Echo: EF 30-35%, glob HK. Mild LVH. Nl RVSP. Mild MR; d. 02/2021 RHC: Elev RH pressures. CO/CI 2.8/1.3; e. 06/2021 cMRI: EF 40%, marked sept-lat dyssynch & septal HK. Small, discrete areas of mid-wall LGE in apical lateral and apical septal walls - ? h/o myocarditis.   Nonobstructive CAD (coronary artery disease)    a. 03/2019 [facility]): EF 63%, ant ischemia; b. 08/2019 Cath: LM nl, LAD 7m, RCA 70p (iFR nl @ 0.97), 50p. EF 50-55%-->Med Rx; c. 06/2020 Cor CTA: Nl FFRs throughout cor tree.   Osteoporosis    Panic attack    PTSD (post-traumatic stress disorder)    PVC's (premature ventricular contractions)    a. 05/2021 Zio: 20.6% PVC burden. 13.4 secs of NSVT-->amio added; b. 06/2021 [facility]io: 5.8% PVC burden. Rare supraventrciular ectopy.   Rectal fistula    Sleep apnea    a. Prev wore CPAP but lost insurance and hasn't been using since (now has insurance).   Wears dentures    full upper    Assessment: Patient Reported Symptoms:  Cognitive Cognitive Status: No symptoms reported,  Alert and oriented to person, place, and time, Insightful and able to interpret abstract concepts, Normal speech and language skills      Neurological Neurological Review of Symptoms: No symptoms reported    HEENT HEENT Symptoms Reported: No symptoms reported      Cardiovascular Cardiovascular Symptoms Reported: No symptoms reported    Respiratory Respiratory Symptoms Reported: Other: Additional Respiratory Details: Continues to have intermittent shortness of breath with moderate activity such as walking outside - this is at baseline.  She doesn't want to discuss this with a Pulmonary MD , states, xray of my lungs says there are no problems with my lungs.  She believes SHOB may be associated with amiodarone  as she has read up on the side effects.  She wants to speak to Dr. Rolan, Cardiologist who prescribed amiodarone ,  at next appt before going back to Pulmonary. Respiratory Management Strategies: Adequate rest, Fluid modification Respiratory Self-Management Outcome: 3 (uncertain)  Endocrine Is patient diabetic?: Yes Is patient checking blood sugars at home?: Yes List most recent blood sugar readings, include date and time of day: Was not sure of today's FBG./ Endocrine Comment: PCP is increasing rybelsus to 6mg  QD, patient has instructions to finish bottle of 3mg  then start on 6mg , PCP gave sample bottle.  A1C slightly improved at 11.1% on 08/21/24, from 11.3% on 05/19/24.  Gastrointestinal Gastrointestinal Symptoms Reported: No symptoms reported      Genitourinary Genitourinary Symptoms Reported: No symptoms  reported    Integumentary Integumentary Symptoms Reported: No symptoms reported    Musculoskeletal Musculoskelatal Symptoms Reviewed: No symptoms reported        Psychosocial Psychosocial Symptoms Reported: No symptoms reported          08/27/2024    PHQ2-9 Depression Screening   Little interest or pleasure in doing things    Feeling down, depressed, or hopeless     PHQ-2 - Total Score    Trouble falling or staying asleep, or sleeping too much    Feeling tired or having little energy    Poor appetite or overeating     Feeling bad about yourself - or that you are a failure or have let yourself or your family down    Trouble concentrating on things, such as reading the newspaper or watching television    Moving or speaking so slowly that other people could have noticed.  Or the opposite - being so fidgety or restless that you have been moving around a lot more than usual    Thoughts that you would be better off dead, or hurting yourself in some way    PHQ2-9 Total Score    If you checked off any problems, how difficult have these problems made it for you to do your work, take care of things at home, or get along with other people    Depression Interventions/Treatment      There were no vitals filed for this visit.  Medications Reviewed Today     Reviewed by Lucian Santana LABOR, RN (Registered Nurse) on 08/27/24 at [phone]  Med List Status: <None>   Medication Order Taking? Sig Documenting Provider Last Dose Status Informant  amiodarone  (PACERONE ) 200 MG tablet [id number]  Take 0.5 tablets (100 mg total) by mouth daily. Rolan Ezra RAMAN, MD  Active Self  aspirin  EC 81 MG tablet [id number]  Take 1 tablet (81 mg total) by mouth daily. Swallow whole. Gerard Frederick, NP  Active Self  Continuous Blood Gluc Receiver (FREESTYLE LIBRE 2 READER) DEVI [id number]  1 each by Does not apply route daily. Melvin Pao, NP  Active Self  Continuous Glucose Sensor (FREESTYLE LIBRE 2 PLUS SENSOR) MISC [id number]  Change sensor every 15 days. Melvin Pao, NP  Active Self  furosemide  (LASIX ) 40 MG tablet [id number] Yes Take 1 tablet (40 mg total) by mouth daily as needed for fluid or edema. Von Bellis, MD  Active   glimepiride  (AMARYL ) 4 MG tablet [id number]  Take 4 mg by mouth daily with breakfast. [provider]  Active Self  insulin  degludec (TRESIBA   FLEXTOUCH) 200 UNIT/ML FlexTouch Pen [id number] Yes Reduce from 100 units to 70 units. Awanda [place], MD  Active Self           Med Note ZENA, [facility]   Mon [name] 15, 2024 11:35 AM) Gets through [facility] PAP- gets sent to [facility] clinic  insulin  lispro (HUMALOG  [facility]) 100 UNIT/ML KwikPen [id number] Yes Inject 20 Units into the skin 3 (three) times daily. With meals. Melvin Pao, NP  Active Self  Insulin  Pen Needle 30G X 6 MM MISC [id number]  1 Application by Does not apply route daily. Melvin Pao, NP  Active Self           Med Note ZENA, CHERYL A   Fri Apr 12, 2023  9:08 AM) 4 times daily   Lancets [facility] PL[facility] [place]) MISC [id number]  TEST ONCE DAILY Melvin Pao, NP  Active   levothyroxine  (SYNTHROID ) 88 MCG tablet  [id number]  Take 1 tablet (88 mcg total) by mouth daily. Melvin Pao, NP  Active   metFORMIN  (GLUCOPHAGE ) 1000 MG tablet [id number]  TAKE 1 TABLET BY MOUTH TWICE  DAILY WITH MEALS Melvin Pao, NP  Active Self  metoprolol  succinate (TOPROL -XL) 25 MG 24 hr tablet [id number] Yes TAKE 1 TABLET BY MOUTH DAILY  Patient taking differently: 12.5 mg daily. TAKE 1 TABLET BY MOUTH DAILY   Hammock, Sheri, NP  Active Self  nitroGLYCERIN  (NITROSTAT ) 0.3 MG SL tablet [id number]  Place 1 tablet (0.3 mg total) under the tongue every 5 (five) minutes as needed for chest pain. Glena Harlene HERO, [place]  Active Self  AISHA FLING test strip [id number]  TEST ONCE DAILY Melvin Pao, NP  Active Self  pantoprazole  (PROTONIX ) 40 MG tablet [id number]  Take 1 tablet (40 mg total) by mouth daily. Rolan Ezra RAMAN, MD  Active Self  polyethylene glycol powder (GLYCOLAX /MIRALAX ) 17 GM/SCOOP powder [id number]  Take 17 g by mouth daily. [provider]  Active Self  QUEtiapine  (SEROQUEL ) 200 MG tablet [id number]  TAKE 1 TABLET BY MOUTH DAILY AT  BEDTIME Melvin Pao, NP  Active Self  rOPINIRole (REQUIP) 0.25 MG tablet [id number]  Take 1 tablet (0.25 mg total) by mouth  at bedtime. Melvin Pao, NP  Active   Semaglutide (RYBELSUS) 3 MG TABS [id number]  Take 3 mg by mouth daily. [provider]  Active Self  valsartan  (DIOVAN ) 40 MG tablet [id number]  Take 0.5 tablets (20 mg total) by mouth daily. Rolan Ezra RAMAN, MD  Active Self            Recommendation:   Specialty provider follow-up : Cardiology (Dr. Perla) 08/31/24; Channing Mealing [facility] 11/14/25[place] [place] 09/14/24 Patient will speak to Cardiology at next appointment  regarding her concerns about amiodarone  and the side effects which she believes may be causing her [facility] and elevated liver enzymes.  She will continue to take the medication as ordered.   Follow Up Plan:   Telephone follow-up 2 weeks  Santana Stamp BSN, CCM [place]  [facility] Population Health RN Care Manager Direct Dial : [phone]  Fax: [phone]

## 2024-08-27 NOTE — Patient Instructions (Signed)
 Visit Information  Thank you for taking time to visit with me today. Please don't hesitate to contact me if I can be of assistance to you before our next scheduled appointment.  Your next care management appointment is by telephone on Wednesday, November 19th at 11:00am.   Please call the care guide team at [phone] if you need to cancel, schedule, or reschedule an appointment.   A reminder to ALL patients/family/friends, please call the [place]  National Suicide Prevention Lifeline: (484)214-8601 or TTY: (415)459-2932 TTY [phone]) to talk to a trained counselor if you are experiencing a Mental Health or Behavioral Health Crisis or need someone to talk to.  Santana Stamp BSN, CCM [place]  [facility] Population Health RN Care Manager Direct Dial : [phone]  Fax: [phone]

## 2024-08-31 ENCOUNTER — Ambulatory Visit: Admitting: Cardiovascular Disease

## 2024-08-31 DIAGNOSIS — I25118 Atherosclerotic heart disease of native coronary artery with other forms of angina pectoris: Secondary | ICD-10-CM

## 2024-08-31 DIAGNOSIS — E785 Hyperlipidemia, unspecified: Secondary | ICD-10-CM

## 2024-08-31 DIAGNOSIS — E1169 Type 2 diabetes mellitus with other specified complication: Secondary | ICD-10-CM

## 2024-08-31 DIAGNOSIS — I493 Ventricular premature depolarization: Secondary | ICD-10-CM

## 2024-08-31 DIAGNOSIS — I5022 Chronic systolic (congestive) heart failure: Secondary | ICD-10-CM

## 2024-08-31 DIAGNOSIS — E1142 Type 2 diabetes mellitus with diabetic polyneuropathy: Secondary | ICD-10-CM

## 2024-08-31 DIAGNOSIS — I420 Dilated cardiomyopathy: Secondary | ICD-10-CM

## 2024-08-31 DIAGNOSIS — I2510 Atherosclerotic heart disease of native coronary artery without angina pectoris: Secondary | ICD-10-CM

## 2024-08-31 DIAGNOSIS — I5020 Unspecified systolic (congestive) heart failure: Secondary | ICD-10-CM

## 2024-08-31 DIAGNOSIS — R55 Syncope and collapse: Secondary | ICD-10-CM

## 2024-08-31 DIAGNOSIS — I700 Atherosclerosis of aorta: Secondary | ICD-10-CM

## 2024-09-01 ENCOUNTER — Encounter: Admitting: Cardiology

## 2024-09-04 ENCOUNTER — Other Ambulatory Visit

## 2024-09-09 ENCOUNTER — Telehealth

## 2024-09-14 ENCOUNTER — Ambulatory Visit: Admitting: Family Medicine

## 2024-09-15 ENCOUNTER — Ambulatory Visit: Admitting: Cardiology

## 2024-09-21 NOTE — Progress Notes (Deleted)
NO SHOW

## 2024-09-21 DEATH — deceased

## 2024-11-23 ENCOUNTER — Ambulatory Visit: Admitting: Nurse Practitioner

## 2025-01-14 ENCOUNTER — Ambulatory Visit
